# Patient Record
Sex: Female | Born: 1945 | Race: White | Hispanic: No | Smoking: Former smoker
Health system: Southern US, Community
[De-identification: ages and names within clinical notes are randomized; demographics above are authoritative.]

## PROBLEM LIST (undated history)

## (undated) DIAGNOSIS — G4730 Sleep apnea, unspecified: Secondary | ICD-10-CM

## (undated) DIAGNOSIS — F419 Anxiety disorder, unspecified: Secondary | ICD-10-CM

## (undated) DIAGNOSIS — G2581 Restless legs syndrome: Secondary | ICD-10-CM

## (undated) DIAGNOSIS — N189 Chronic kidney disease, unspecified: Secondary | ICD-10-CM

## (undated) DIAGNOSIS — M1990 Unspecified osteoarthritis, unspecified site: Secondary | ICD-10-CM

## (undated) DIAGNOSIS — R509 Fever, unspecified: Secondary | ICD-10-CM

## (undated) DIAGNOSIS — K5790 Diverticulosis of intestine, part unspecified, without perforation or abscess without bleeding: Secondary | ICD-10-CM

## (undated) DIAGNOSIS — N879 Dysplasia of cervix uteri, unspecified: Secondary | ICD-10-CM

## (undated) DIAGNOSIS — R569 Unspecified convulsions: Secondary | ICD-10-CM

## (undated) DIAGNOSIS — K219 Gastro-esophageal reflux disease without esophagitis: Secondary | ICD-10-CM

## (undated) DIAGNOSIS — K3184 Gastroparesis: Secondary | ICD-10-CM

## (undated) DIAGNOSIS — D649 Anemia, unspecified: Secondary | ICD-10-CM

## (undated) DIAGNOSIS — Z9221 Personal history of antineoplastic chemotherapy: Secondary | ICD-10-CM

## (undated) DIAGNOSIS — E119 Type 2 diabetes mellitus without complications: Secondary | ICD-10-CM

## (undated) DIAGNOSIS — Z972 Presence of dental prosthetic device (complete) (partial): Secondary | ICD-10-CM

## (undated) DIAGNOSIS — R0600 Dyspnea, unspecified: Secondary | ICD-10-CM

## (undated) DIAGNOSIS — Z923 Personal history of irradiation: Secondary | ICD-10-CM

## (undated) DIAGNOSIS — B029 Zoster without complications: Secondary | ICD-10-CM

## (undated) DIAGNOSIS — T7840XA Allergy, unspecified, initial encounter: Secondary | ICD-10-CM

## (undated) DIAGNOSIS — I10 Essential (primary) hypertension: Secondary | ICD-10-CM

## (undated) DIAGNOSIS — C099 Malignant neoplasm of tonsil, unspecified: Secondary | ICD-10-CM

## (undated) DIAGNOSIS — J189 Pneumonia, unspecified organism: Secondary | ICD-10-CM

## (undated) DIAGNOSIS — C50919 Malignant neoplasm of unspecified site of unspecified female breast: Secondary | ICD-10-CM

## (undated) DIAGNOSIS — E785 Hyperlipidemia, unspecified: Secondary | ICD-10-CM

## (undated) DIAGNOSIS — G709 Myoneural disorder, unspecified: Secondary | ICD-10-CM

## (undated) DIAGNOSIS — H269 Unspecified cataract: Secondary | ICD-10-CM

## (undated) DIAGNOSIS — G629 Polyneuropathy, unspecified: Secondary | ICD-10-CM

## (undated) HISTORY — DX: Fever, unspecified: R50.9

## (undated) HISTORY — DX: Unspecified cataract: H26.9

## (undated) HISTORY — PX: FRACTURE SURGERY: SHX138

## (undated) HISTORY — PX: MOLE REMOVAL: SHX2046

## (undated) HISTORY — DX: Zoster without complications: B02.9

## (undated) HISTORY — DX: Dysplasia of cervix uteri, unspecified: N87.9

## (undated) HISTORY — PX: TONSILLECTOMY: SUR1361

## (undated) HISTORY — PX: EYE SURGERY: SHX253

## (undated) HISTORY — PX: PORT-A-CATH REMOVAL: SHX5289

## (undated) HISTORY — DX: Gastroparesis: K31.84

## (undated) HISTORY — DX: Anxiety disorder, unspecified: F41.9

## (undated) HISTORY — DX: Malignant neoplasm of unspecified site of unspecified female breast: C50.919

## (undated) HISTORY — DX: Malignant neoplasm of tonsil, unspecified: C09.9

## (undated) HISTORY — DX: Chronic kidney disease, unspecified: N18.9

## (undated) HISTORY — PX: RADICAL NECK DISSECTION: SHX2284

## (undated) HISTORY — PX: APPENDECTOMY: SHX54

## (undated) HISTORY — DX: Hyperlipidemia, unspecified: E78.5

## (undated) HISTORY — DX: Diverticulosis of intestine, part unspecified, without perforation or abscess without bleeding: K57.90

## (undated) HISTORY — PX: CHOLECYSTECTOMY: SHX55

## (undated) HISTORY — DX: Anemia, unspecified: D64.9

## (undated) HISTORY — PX: BREAST SURGERY: SHX581

## (undated) HISTORY — DX: Allergy, unspecified, initial encounter: T78.40XA

## (undated) HISTORY — PX: BREAST CYST ASPIRATION: SHX578

## (undated) HISTORY — DX: Unspecified convulsions: R56.9

## (undated) HISTORY — PX: PORTACATH PLACEMENT: SHX2246

## (undated) HISTORY — DX: Unspecified osteoarthritis, unspecified site: M19.90

---

## 1997-07-07 ENCOUNTER — Emergency Department (HOSPITAL_COMMUNITY): Admission: EM | Admit: 1997-07-07 | Discharge: 1997-07-07 | Payer: Self-pay | Admitting: Emergency Medicine

## 2004-01-03 ENCOUNTER — Ambulatory Visit: Payer: Self-pay | Admitting: Family Medicine

## 2004-02-17 DIAGNOSIS — C099 Malignant neoplasm of tonsil, unspecified: Secondary | ICD-10-CM

## 2004-02-17 HISTORY — DX: Malignant neoplasm of tonsil, unspecified: C09.9

## 2004-03-04 ENCOUNTER — Ambulatory Visit: Payer: Self-pay | Admitting: Family Medicine

## 2004-03-12 ENCOUNTER — Ambulatory Visit: Payer: Self-pay | Admitting: General Surgery

## 2004-03-14 ENCOUNTER — Ambulatory Visit: Payer: Self-pay | Admitting: Otolaryngology

## 2004-03-17 ENCOUNTER — Ambulatory Visit: Payer: Self-pay | Admitting: Radiation Oncology

## 2004-03-19 ENCOUNTER — Ambulatory Visit: Payer: Self-pay | Admitting: Oncology

## 2004-03-21 ENCOUNTER — Ambulatory Visit: Payer: Self-pay | Admitting: Oncology

## 2004-03-26 ENCOUNTER — Ambulatory Visit: Payer: Self-pay | Admitting: Otolaryngology

## 2004-04-16 ENCOUNTER — Ambulatory Visit: Payer: Self-pay | Admitting: Radiation Oncology

## 2004-05-17 ENCOUNTER — Ambulatory Visit: Payer: Self-pay | Admitting: Radiation Oncology

## 2004-05-22 ENCOUNTER — Ambulatory Visit: Payer: Self-pay | Admitting: Family Medicine

## 2004-05-27 ENCOUNTER — Ambulatory Visit: Payer: Self-pay | Admitting: Family Medicine

## 2004-06-16 ENCOUNTER — Ambulatory Visit: Payer: Self-pay | Admitting: Radiation Oncology

## 2004-07-17 ENCOUNTER — Ambulatory Visit: Payer: Self-pay | Admitting: Radiation Oncology

## 2004-08-16 ENCOUNTER — Ambulatory Visit: Payer: Self-pay | Admitting: Radiation Oncology

## 2004-08-26 ENCOUNTER — Ambulatory Visit: Payer: Self-pay | Admitting: Family Medicine

## 2004-09-12 ENCOUNTER — Ambulatory Visit: Payer: Self-pay | Admitting: Oncology

## 2004-09-17 ENCOUNTER — Ambulatory Visit: Payer: Self-pay | Admitting: Oncology

## 2004-09-24 ENCOUNTER — Ambulatory Visit: Payer: Self-pay | Admitting: Family Medicine

## 2004-10-08 ENCOUNTER — Inpatient Hospital Stay: Payer: Self-pay | Admitting: Otolaryngology

## 2004-10-21 ENCOUNTER — Ambulatory Visit: Payer: Self-pay | Admitting: Otolaryngology

## 2004-10-24 ENCOUNTER — Encounter: Payer: Self-pay | Admitting: Otolaryngology

## 2004-11-16 ENCOUNTER — Encounter: Payer: Self-pay | Admitting: Otolaryngology

## 2004-11-21 ENCOUNTER — Ambulatory Visit: Payer: Self-pay | Admitting: Otolaryngology

## 2004-12-11 ENCOUNTER — Ambulatory Visit: Payer: Self-pay | Admitting: Radiation Oncology

## 2004-12-25 ENCOUNTER — Ambulatory Visit: Payer: Self-pay | Admitting: Oncology

## 2005-01-16 ENCOUNTER — Ambulatory Visit: Payer: Self-pay | Admitting: Oncology

## 2005-01-30 ENCOUNTER — Ambulatory Visit: Payer: Self-pay | Admitting: Specialist

## 2005-02-16 ENCOUNTER — Ambulatory Visit: Payer: Self-pay | Admitting: Oncology

## 2005-03-26 ENCOUNTER — Ambulatory Visit: Payer: Self-pay | Admitting: Family Medicine

## 2005-03-27 ENCOUNTER — Ambulatory Visit: Payer: Self-pay | Admitting: Oncology

## 2005-04-01 ENCOUNTER — Ambulatory Visit: Payer: Self-pay | Admitting: Oncology

## 2005-04-16 ENCOUNTER — Ambulatory Visit: Payer: Self-pay | Admitting: Oncology

## 2005-07-28 ENCOUNTER — Ambulatory Visit: Payer: Self-pay | Admitting: Oncology

## 2005-08-16 ENCOUNTER — Ambulatory Visit: Payer: Self-pay | Admitting: Oncology

## 2005-09-17 ENCOUNTER — Ambulatory Visit: Payer: Self-pay | Admitting: Radiation Oncology

## 2005-11-09 ENCOUNTER — Ambulatory Visit: Payer: Self-pay | Admitting: Family Medicine

## 2005-11-09 ENCOUNTER — Encounter: Payer: Self-pay | Admitting: Family Medicine

## 2005-11-09 ENCOUNTER — Other Ambulatory Visit: Admission: RE | Admit: 2005-11-09 | Discharge: 2005-11-09 | Payer: Self-pay | Admitting: Family Medicine

## 2005-11-16 LAB — HM DEXA SCAN

## 2005-11-25 ENCOUNTER — Ambulatory Visit: Payer: Self-pay | Admitting: Oncology

## 2005-12-09 ENCOUNTER — Ambulatory Visit: Payer: Self-pay | Admitting: Family Medicine

## 2005-12-11 LAB — FECAL OCCULT BLOOD, GUAIAC: Fecal Occult Blood: NEGATIVE

## 2005-12-17 ENCOUNTER — Ambulatory Visit: Payer: Self-pay | Admitting: Family Medicine

## 2005-12-17 ENCOUNTER — Ambulatory Visit: Payer: Self-pay | Admitting: Oncology

## 2006-02-18 ENCOUNTER — Ambulatory Visit: Payer: Self-pay | Admitting: Radiation Oncology

## 2006-03-25 ENCOUNTER — Ambulatory Visit: Payer: Self-pay | Admitting: Oncology

## 2006-04-17 ENCOUNTER — Ambulatory Visit: Payer: Self-pay | Admitting: Oncology

## 2006-05-25 ENCOUNTER — Ambulatory Visit: Payer: Self-pay | Admitting: Family Medicine

## 2006-05-25 LAB — CONVERTED CEMR LAB
ALT: 10 units/L (ref 0–40)
AST: 18 units/L (ref 0–37)
LDL Cholesterol: 94 mg/dL (ref 0–99)
Total CHOL/HDL Ratio: 3.7
Triglycerides: 168 mg/dL — ABNORMAL HIGH (ref 0–149)
VLDL: 34 mg/dL (ref 0–40)

## 2006-05-27 ENCOUNTER — Ambulatory Visit: Payer: Self-pay | Admitting: Family Medicine

## 2006-06-03 ENCOUNTER — Ambulatory Visit: Payer: Self-pay | Admitting: Otolaryngology

## 2006-07-18 ENCOUNTER — Ambulatory Visit: Payer: Self-pay | Admitting: Oncology

## 2006-07-22 ENCOUNTER — Ambulatory Visit: Payer: Self-pay | Admitting: Oncology

## 2006-08-17 ENCOUNTER — Ambulatory Visit: Payer: Self-pay | Admitting: Oncology

## 2007-01-17 ENCOUNTER — Ambulatory Visit: Payer: Self-pay | Admitting: Oncology

## 2007-01-19 ENCOUNTER — Encounter (INDEPENDENT_AMBULATORY_CARE_PROVIDER_SITE_OTHER): Payer: Self-pay | Admitting: *Deleted

## 2007-01-21 ENCOUNTER — Encounter: Payer: Self-pay | Admitting: Family Medicine

## 2007-01-21 ENCOUNTER — Ambulatory Visit: Payer: Self-pay | Admitting: Oncology

## 2007-02-17 ENCOUNTER — Ambulatory Visit: Payer: Self-pay | Admitting: Oncology

## 2007-03-20 ENCOUNTER — Ambulatory Visit: Payer: Self-pay | Admitting: Oncology

## 2007-06-02 ENCOUNTER — Ambulatory Visit: Payer: Self-pay | Admitting: Family Medicine

## 2007-06-02 DIAGNOSIS — L989 Disorder of the skin and subcutaneous tissue, unspecified: Secondary | ICD-10-CM | POA: Insufficient documentation

## 2007-07-18 ENCOUNTER — Ambulatory Visit: Payer: Self-pay | Admitting: Oncology

## 2007-07-18 ENCOUNTER — Encounter: Payer: Self-pay | Admitting: Family Medicine

## 2007-07-18 DIAGNOSIS — R609 Edema, unspecified: Secondary | ICD-10-CM

## 2007-07-18 DIAGNOSIS — E785 Hyperlipidemia, unspecified: Secondary | ICD-10-CM | POA: Insufficient documentation

## 2007-07-19 ENCOUNTER — Ambulatory Visit: Payer: Self-pay | Admitting: Family Medicine

## 2007-07-19 DIAGNOSIS — M899 Disorder of bone, unspecified: Secondary | ICD-10-CM | POA: Insufficient documentation

## 2007-07-19 DIAGNOSIS — M949 Disorder of cartilage, unspecified: Secondary | ICD-10-CM

## 2007-07-21 ENCOUNTER — Ambulatory Visit: Payer: Self-pay | Admitting: Family Medicine

## 2007-07-22 LAB — CONVERTED CEMR LAB
ALT: 14 units/L (ref 0–35)
Albumin: 3.7 g/dL (ref 3.5–5.2)
BUN: 9 mg/dL (ref 6–23)
Basophils Relative: 0.5 % (ref 0.0–1.0)
Bilirubin, Direct: 0.1 mg/dL (ref 0.0–0.3)
CO2: 28 meq/L (ref 19–32)
Calcium: 9 mg/dL (ref 8.4–10.5)
Eosinophils Relative: 1.8 % (ref 0.0–5.0)
GFR calc Af Amer: 94 mL/min
Glucose, Bld: 112 mg/dL — ABNORMAL HIGH (ref 70–99)
HCT: 34 % — ABNORMAL LOW (ref 36.0–46.0)
Hemoglobin: 11.3 g/dL — ABNORMAL LOW (ref 12.0–15.0)
LDL Cholesterol: 110 mg/dL — ABNORMAL HIGH (ref 0–99)
Lymphocytes Relative: 20.8 % (ref 12.0–46.0)
Monocytes Absolute: 0.2 10*3/uL (ref 0.1–1.0)
Monocytes Relative: 6.4 % (ref 3.0–12.0)
Neutro Abs: 2.6 10*3/uL (ref 1.4–7.7)
RBC: 3.99 M/uL (ref 3.87–5.11)
RDW: 13.3 % (ref 11.5–14.6)
Sodium: 140 meq/L (ref 135–145)
Total CHOL/HDL Ratio: 3.6
Total Protein: 6.9 g/dL (ref 6.0–8.3)
Triglycerides: 83 mg/dL (ref 0–149)
WBC: 3.6 10*3/uL — ABNORMAL LOW (ref 4.5–10.5)

## 2007-07-24 LAB — CONVERTED CEMR LAB: Vit D, 1,25-Dihydroxy: 27 — ABNORMAL LOW (ref 30–89)

## 2007-07-25 ENCOUNTER — Encounter (INDEPENDENT_AMBULATORY_CARE_PROVIDER_SITE_OTHER): Payer: Self-pay | Admitting: *Deleted

## 2007-07-28 ENCOUNTER — Ambulatory Visit: Payer: Self-pay | Admitting: Oncology

## 2007-08-17 ENCOUNTER — Ambulatory Visit: Payer: Self-pay | Admitting: Oncology

## 2007-09-20 ENCOUNTER — Ambulatory Visit: Payer: Self-pay | Admitting: Family Medicine

## 2007-10-25 ENCOUNTER — Emergency Department: Payer: Self-pay | Admitting: Emergency Medicine

## 2007-12-12 ENCOUNTER — Encounter: Payer: Self-pay | Admitting: Family Medicine

## 2007-12-12 ENCOUNTER — Ambulatory Visit: Payer: Self-pay | Admitting: Family Medicine

## 2008-01-17 ENCOUNTER — Ambulatory Visit: Payer: Self-pay | Admitting: Oncology

## 2008-01-25 ENCOUNTER — Ambulatory Visit: Payer: Self-pay | Admitting: Oncology

## 2008-01-25 ENCOUNTER — Encounter: Payer: Self-pay | Admitting: Family Medicine

## 2008-02-17 ENCOUNTER — Ambulatory Visit: Payer: Self-pay | Admitting: Oncology

## 2008-03-19 ENCOUNTER — Ambulatory Visit: Payer: Self-pay | Admitting: Oncology

## 2008-07-04 ENCOUNTER — Telehealth: Payer: Self-pay | Admitting: Family Medicine

## 2008-07-17 ENCOUNTER — Ambulatory Visit: Payer: Self-pay | Admitting: Oncology

## 2008-07-19 ENCOUNTER — Encounter: Payer: Self-pay | Admitting: Family Medicine

## 2008-07-19 ENCOUNTER — Ambulatory Visit: Payer: Self-pay | Admitting: Family Medicine

## 2008-07-19 ENCOUNTER — Other Ambulatory Visit: Admission: RE | Admit: 2008-07-19 | Discharge: 2008-07-19 | Payer: Self-pay | Admitting: Family Medicine

## 2008-07-19 DIAGNOSIS — E559 Vitamin D deficiency, unspecified: Secondary | ICD-10-CM

## 2008-07-23 LAB — CONVERTED CEMR LAB
ALT: 17 units/L (ref 0–35)
AST: 22 units/L (ref 0–37)
Albumin: 3.9 g/dL (ref 3.5–5.2)
Alkaline Phosphatase: 80 units/L (ref 39–117)
Basophils Relative: 0.5 % (ref 0.0–3.0)
CO2: 30 meq/L (ref 19–32)
Calcium: 9.1 mg/dL (ref 8.4–10.5)
Eosinophils Relative: 2 % (ref 0.0–5.0)
GFR calc non Af Amer: 77.6 mL/min (ref >60)
HCT: 33.4 % — ABNORMAL LOW (ref 36.0–46.0)
HDL: 54.1 mg/dL (ref >39.00)
Hemoglobin: 11.1 g/dL — ABNORMAL LOW (ref 12.0–15.0)
Lymphocytes Relative: 28.5 % (ref 12.0–46.0)
Monocytes Relative: 5.2 % (ref 3.0–12.0)
Neutro Abs: 3.5 10*3/uL (ref 1.4–7.7)
Potassium: 3.9 meq/L (ref 3.5–5.1)
RBC: 4 M/uL (ref 3.87–5.11)
Sodium: 142 meq/L (ref 135–145)
Total CHOL/HDL Ratio: 3
Total Protein: 7.3 g/dL (ref 6.0–8.3)
VLDL: 21.8 mg/dL (ref 0.0–40.0)

## 2008-08-01 ENCOUNTER — Ambulatory Visit: Payer: Self-pay | Admitting: Family Medicine

## 2008-08-01 ENCOUNTER — Ambulatory Visit: Payer: Self-pay | Admitting: Oncology

## 2008-08-01 LAB — CONVERTED CEMR LAB: OCCULT 2: NEGATIVE

## 2008-08-03 ENCOUNTER — Encounter (INDEPENDENT_AMBULATORY_CARE_PROVIDER_SITE_OTHER): Payer: Self-pay | Admitting: *Deleted

## 2008-08-16 ENCOUNTER — Ambulatory Visit: Payer: Self-pay | Admitting: Oncology

## 2008-11-20 ENCOUNTER — Telehealth: Payer: Self-pay | Admitting: Family Medicine

## 2009-01-16 ENCOUNTER — Ambulatory Visit: Payer: Self-pay | Admitting: Oncology

## 2009-01-31 ENCOUNTER — Ambulatory Visit: Payer: Self-pay | Admitting: Oncology

## 2009-02-16 ENCOUNTER — Ambulatory Visit: Payer: Self-pay | Admitting: Oncology

## 2009-03-19 ENCOUNTER — Ambulatory Visit: Payer: Self-pay | Admitting: Oncology

## 2009-03-19 LAB — HM MAMMOGRAPHY: HM Mammogram: NORMAL

## 2009-04-16 ENCOUNTER — Ambulatory Visit: Payer: Self-pay | Admitting: Oncology

## 2009-06-16 ENCOUNTER — Ambulatory Visit: Payer: Self-pay | Admitting: Oncology

## 2009-06-26 ENCOUNTER — Ambulatory Visit: Payer: Self-pay | Admitting: Oncology

## 2009-07-17 ENCOUNTER — Ambulatory Visit: Payer: Self-pay | Admitting: Oncology

## 2009-07-17 HISTORY — PX: ESOPHAGOGASTRODUODENOSCOPY: SHX1529

## 2009-07-19 ENCOUNTER — Encounter: Payer: Self-pay | Admitting: Family Medicine

## 2009-07-24 ENCOUNTER — Ambulatory Visit: Payer: Self-pay | Admitting: Gastroenterology

## 2009-07-24 ENCOUNTER — Encounter: Payer: Self-pay | Admitting: Family Medicine

## 2009-07-24 ENCOUNTER — Telehealth (INDEPENDENT_AMBULATORY_CARE_PROVIDER_SITE_OTHER): Payer: Self-pay | Admitting: *Deleted

## 2009-07-29 ENCOUNTER — Telehealth: Payer: Self-pay | Admitting: Family Medicine

## 2009-08-16 ENCOUNTER — Ambulatory Visit: Payer: Self-pay | Admitting: Oncology

## 2009-08-26 ENCOUNTER — Ambulatory Visit: Payer: Self-pay | Admitting: Family Medicine

## 2009-08-26 DIAGNOSIS — F411 Generalized anxiety disorder: Secondary | ICD-10-CM | POA: Insufficient documentation

## 2009-08-26 DIAGNOSIS — K3184 Gastroparesis: Secondary | ICD-10-CM

## 2009-08-28 LAB — CONVERTED CEMR LAB
Alkaline Phosphatase: 79 units/L (ref 39–117)
Basophils Absolute: 0 10*3/uL (ref 0.0–0.1)
Basophils Relative: 0.4 % (ref 0.0–3.0)
Bilirubin, Direct: 0.1 mg/dL (ref 0.0–0.3)
CO2: 29 meq/L (ref 19–32)
Calcium: 9 mg/dL (ref 8.4–10.5)
Creatinine, Ser: 0.7 mg/dL (ref 0.4–1.2)
Eosinophils Absolute: 0.2 10*3/uL (ref 0.0–0.7)
HDL: 46.6 mg/dL (ref >39.00)
LDL Cholesterol: 93 mg/dL (ref 0–99)
Lymphocytes Relative: 21.9 % (ref 12.0–46.0)
MCHC: 33.4 g/dL (ref 30.0–36.0)
Neutrophils Relative %: 66.7 % (ref 43.0–77.0)
RBC: 3.99 M/uL (ref 3.87–5.11)
Total CHOL/HDL Ratio: 3
Total Protein: 6.8 g/dL (ref 6.0–8.3)
Triglycerides: 110 mg/dL (ref 0.0–149.0)
VLDL: 22 mg/dL (ref 0.0–40.0)

## 2009-08-29 ENCOUNTER — Ambulatory Visit: Payer: Self-pay | Admitting: Family Medicine

## 2009-08-29 DIAGNOSIS — D509 Iron deficiency anemia, unspecified: Secondary | ICD-10-CM

## 2009-08-30 LAB — CONVERTED CEMR LAB
Folate: 11.1 ng/mL
Iron: 50 ug/dL (ref 42–145)
Transferrin: 306.8 mg/dL (ref 212.0–360.0)
Vitamin B-12: 191 pg/mL — ABNORMAL LOW (ref 211–911)

## 2009-09-04 ENCOUNTER — Ambulatory Visit: Payer: Self-pay | Admitting: Family Medicine

## 2009-09-04 DIAGNOSIS — E538 Deficiency of other specified B group vitamins: Secondary | ICD-10-CM

## 2009-09-05 ENCOUNTER — Ambulatory Visit: Payer: Self-pay | Admitting: Family Medicine

## 2009-09-11 ENCOUNTER — Ambulatory Visit: Payer: Self-pay | Admitting: Family Medicine

## 2009-09-16 ENCOUNTER — Ambulatory Visit: Payer: Self-pay | Admitting: Oncology

## 2009-09-18 ENCOUNTER — Ambulatory Visit: Payer: Self-pay | Admitting: Family Medicine

## 2009-09-25 ENCOUNTER — Ambulatory Visit: Payer: Self-pay | Admitting: Family Medicine

## 2009-10-02 ENCOUNTER — Ambulatory Visit: Payer: Self-pay | Admitting: Family Medicine

## 2009-10-03 ENCOUNTER — Encounter: Payer: Self-pay | Admitting: Family Medicine

## 2009-10-03 LAB — CONVERTED CEMR LAB
Basophils Absolute: 0 10*3/uL (ref 0.0–0.1)
Basophils Relative: 0.5 % (ref 0.0–3.0)
Eosinophils Absolute: 0.1 10*3/uL (ref 0.0–0.7)
Eosinophils Relative: 2 % (ref 0.0–5.0)
Ferritin: 23.3 ng/mL (ref 10.0–291.0)
HCT: 35.2 % — ABNORMAL LOW (ref 36.0–46.0)
Hemoglobin: 11.5 g/dL — ABNORMAL LOW (ref 12.0–15.0)
Lymphocytes Relative: 23 % (ref 12.0–46.0)
Lymphs Abs: 1.3 10*3/uL (ref 0.7–4.0)
MCHC: 32.6 g/dL (ref 30.0–36.0)
MCV: 81.5 fL (ref 78.0–100.0)
Monocytes Absolute: 0.5 10*3/uL (ref 0.1–1.0)
Monocytes Relative: 8.6 % (ref 3.0–12.0)
Neutro Abs: 3.6 10*3/uL (ref 1.4–7.7)
Neutrophils Relative %: 65.9 % (ref 43.0–77.0)
Platelets: 274 10*3/uL (ref 150.0–400.0)
RBC: 4.32 M/uL (ref 3.87–5.11)
RDW: 20 % — ABNORMAL HIGH (ref 11.5–14.6)
Vitamin B-12: 504 pg/mL (ref 211–911)
WBC: 5.5 10*3/uL (ref 4.5–10.5)

## 2009-10-07 ENCOUNTER — Ambulatory Visit: Payer: Self-pay | Admitting: Oncology

## 2009-10-09 ENCOUNTER — Encounter: Payer: Self-pay | Admitting: Family Medicine

## 2009-10-29 ENCOUNTER — Ambulatory Visit: Payer: Self-pay | Admitting: Gastroenterology

## 2009-10-29 ENCOUNTER — Encounter: Payer: Self-pay | Admitting: Family Medicine

## 2009-10-31 LAB — PATHOLOGY REPORT

## 2009-11-13 ENCOUNTER — Ambulatory Visit: Payer: Self-pay | Admitting: Family Medicine

## 2009-11-26 ENCOUNTER — Ambulatory Visit: Payer: Self-pay | Admitting: Family Medicine

## 2009-11-26 DIAGNOSIS — R109 Unspecified abdominal pain: Secondary | ICD-10-CM | POA: Insufficient documentation

## 2009-11-26 LAB — CONVERTED CEMR LAB
Bacteria, UA: 0
Bilirubin Urine: NEGATIVE
Protein, U semiquant: NEGATIVE
Urobilinogen, UA: 0.2
WBC Urine, dipstick: NEGATIVE
WBC, UA: 0 cells/hpf
Yeast, UA: 0

## 2009-12-03 ENCOUNTER — Encounter: Payer: Self-pay | Admitting: Family Medicine

## 2009-12-03 ENCOUNTER — Ambulatory Visit: Payer: Self-pay | Admitting: Family Medicine

## 2009-12-17 ENCOUNTER — Encounter: Payer: Self-pay | Admitting: Family Medicine

## 2009-12-25 ENCOUNTER — Ambulatory Visit: Payer: Self-pay | Admitting: Family Medicine

## 2009-12-25 LAB — CONVERTED CEMR LAB
Basophils Absolute: 0 10*3/uL (ref 0.0–0.1)
Eosinophils Absolute: 0.2 10*3/uL (ref 0.0–0.7)
HCT: 38 % (ref 36.0–46.0)
Lymphs Abs: 1.3 10*3/uL (ref 0.7–4.0)
MCHC: 33.6 g/dL (ref 30.0–36.0)
Monocytes Absolute: 0.4 10*3/uL (ref 0.1–1.0)
Monocytes Relative: 7.8 % (ref 3.0–12.0)
Platelets: 256 10*3/uL (ref 150.0–400.0)
RDW: 14.7 % — ABNORMAL HIGH (ref 11.5–14.6)
Vitamin B-12: 306 pg/mL (ref 211–911)

## 2009-12-31 ENCOUNTER — Encounter: Payer: Self-pay | Admitting: Family Medicine

## 2010-01-01 ENCOUNTER — Ambulatory Visit: Payer: Self-pay | Admitting: Family Medicine

## 2010-03-04 ENCOUNTER — Ambulatory Visit
Admission: RE | Admit: 2010-03-04 | Discharge: 2010-03-04 | Payer: Self-pay | Source: Home / Self Care | Attending: Family Medicine | Admitting: Family Medicine

## 2010-03-18 NOTE — Miscellaneous (Signed)
Summary: Cyanocobalamin injection added to med list  Clinical Lists Changes  Medications: Added new medication of VITAMIN B COMPLEX-C   CAPS (B COMPLEX-C) Take 1 capsule by mouth once a day Added new medication of CYANOCOBALAMIN 1000 MCG/ML SOLN (CYANOCOBALAMIN) get Vit B 12 injection every six weeks.     Current Allergies: ! * DIFLUCAN ! AMOXICILLIN

## 2010-03-18 NOTE — Procedures (Signed)
Summary: Upper GI Endoscopy by Dr.Janak [facility]  Upper GI Endoscopy by Dr.Janak [facility]   Imported By: Beau Fanny 07/26/2009 16:47:42  _____________________________________________________________________  External Attachment:    Type:   Image     Comment:   External Document  Appended Document: Upper GI Endoscopy by Dr.Janak [facility]    Clinical Lists Changes  Observations: Added new observation of PAST MED HX: Hyperlipidemia tonsillar cancer diverticulosis  shingles in past   oncol--Dr [facility] derm-- Dr Orson Aloe ENT-- Dr Gertie Baron GI- Dr Bluford Kaufmann  (07/28/2009 15:41) Added new observation of PAST SURG HX: Breast biopsy- benign Appendectomy 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) Holter monitor- neg Dexa- normal (2002),  osteopenia forearm (11/2005) Colonoscopy- diverticulosis (02/2003) Tonsillectomy- cancer, had chemo and rad Cholecystectomy mole removals EGD- nl with few gastric polyps 6/11 (07/28/2009 15:41)       Past Medical History:    Hyperlipidemia    tonsillar cancer    diverticulosis     shingles in past            oncol--Dr [facility]    derm-- Dr Orson Aloe    ENT-- Dr Gertie Baron    GI- Dr Bluford Kaufmann  Past Surgical History:    Breast biopsy- benign    Appendectomy    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

## 2010-03-18 NOTE — Consult Note (Signed)
Summary: [facility]  [facility]   Imported By: Maryln Gottron 12/23/2009 13:46:57  _____________________________________________________________________  External Attachment:    Type:   Image     Comment:   External Document

## 2010-03-18 NOTE — Miscellaneous (Signed)
Summary: Controlled Substance Agreement  Controlled Substance Agreement   Imported By: Lanelle Bal 08/29/2009 10:26:18  _____________________________________________________________________  External Attachment:    Type:   Image     Comment:   External Document

## 2010-03-18 NOTE — Letter (Signed)
Summary: [facility]  [facility]   Imported By: Lanelle Bal 11/11/2009 13:52:22  _____________________________________________________________________  External Attachment:    Type:   Image     Comment:   External Document

## 2010-03-18 NOTE — Procedures (Signed)
Summary: Colonoscopy by Dr.Paul Oh  Colonoscopy by Dr.Paul Oh   Imported By: Beau Fanny 11/07/2009 09:08:14  _____________________________________________________________________  External Attachment:    Type:   Image     Comment:   External Document  Appended Document: Colonoscopy by Dr.Paul Oh    Clinical Lists Changes  Observations: Added new observation of PAST MED HX: Hyperlipidemia tonsillar cancer diverticulosis  shingles in past gastroparesis (related to prev radition tx)  anxiety anemia iron def anemia B12 def  diverticulosis on colonosc  oncol--Dr Choski derm-- Dr Orson Aloe ENT-- Dr Gertie Baron GI- Dr Bluford Kaufmann  (11/07/2009 20:17) Added new observation of PAST SURG HX: Breast biopsy- benign Appendectomy 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) Holter monitor- neg Dexa- normal (2002),  osteopenia forearm (11/2005) Colonoscopy- diverticulosis (02/2003) Tonsillectomy- cancer, had chemo and rad Cholecystectomy mole removals EGD- nl with few gastric polyps 6/11 9/11 colonosc with tics  (11/07/2009 20:17) Added new observation of COLONOSCOPY: Diverticulosis (10/29/2009 20:18)       Past Medical History:    Hyperlipidemia    tonsillar cancer    diverticulosis     shingles in past    gastroparesis (related to prev radition tx)     anxiety    anemia iron def    anemia B12 def     diverticulosis on colonosc        oncol--Dr Choski    derm-- Dr Orson Aloe    ENT-- Dr Gertie Baron    GI- Dr Bluford Kaufmann  Past Surgical History:    Breast biopsy- benign    Appendectomy    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

## 2010-03-18 NOTE — Assessment & Plan Note (Signed)
Summary: FOLLOW UP AFTER LABS PER DR Itzamar Traynor/RI   Vital Signs:  Patient profile:   65 year old female Height:      65 inches Weight:      185.25 pounds BMI:     30.94 Temp:     98 degrees F oral Pulse rate:   68 / minute Pulse rhythm:   regular BP sitting:   126 / 70  (left arm) Cuff size:   regular  Vitals Entered By: Lewanda Rife LPN (September 04, 2009 11:57 AM) CC: follow-up visit after labs   History of Present Illness: here for f/u of iron def anemia and also B12 def  wt is down 3 lb  bp good   B12 is 191- low   ferritin low at 6.9 and iron sat low at 11.6 iron level low nl at 50 hb is 10.4   colonosc showed diverticulosis in 05 6/11 few gastric polyps on EGD  has been anemic all her life  at a time from menses then from giving blood- stopped that  then poss from chemo and radiation   has been extremely tired   did a stool card immunoassay and dropped it off sunday   oncologist did not recommend frequent colonosc  last 05 with diverticulosis no colon ca in fam  no hx of polyps   no abd pain no blood in stool reglan has helped all her gi symptoms -- has stopped vomiting       Allergies: 1)  ! * Diflucan 2)  ! 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 basal cell , CHF Mother: high chol, skin ca -? 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 Eyes:  Denies blurring. CV:  Denies chest pain or discomfort, lightheadness, and palpitations. Resp:  Denies cough, shortness of breath, and wheezing. GI:  Complains of indigestion; denies abdominal pain, bloody stools, change in bowel habits, nausea, and vomiting. GU:  Denies hematuria and urinary frequency. MS:  Denies muscle aches, cramps, and stiffness. Derm:  Denies itching, lesion(s), poor wound healing, and rash. Neuro:  Denies numbness and tingling. Heme:  Denies abnormal bruising, bleeding, and enlarge lymph nodes.  Physical Exam  General:  overweight but generally well appearing  Head:  normocephalic, atraumatic, and no abnormalities observed.   Eyes:  vision grossly intact, pupils equal, pupils round, and pupils reactive to light.  no conjunctival pallor, injection or icterus  Mouth:  pharynx pink and moist.   Neck:  supple ,nl rom ,  no adenopathy sx changes noted  Lungs:  Normal respiratory effort, chest expands symmetrically. Lungs are clear to auscultation, no crackles or wheezes. Heart:  Normal rate and regular rhythm. S1 and S2 normal without gallop, murmur, click, rub or other extra sounds. Abdomen:  mild tenderness without rebound or gaurding around umbilicus normal bowel sounds, no distention, no masses, no hepatomegaly, and no splenomegaly.   Msk:  No deformity or scoliosis noted of thoracic or lumbar spine.   Extremities:  No clubbing, cyanosis,  edema, or deformity noted with normal full range of motion of all joints.   Neurologic:  sensation intact to light touch, gait normal, and DTRs symmetrical and normal.   Skin:  Intact without suspicious lesions or rashes no pallor or jaundice  Cervical Nodes:  No lymphadenopathy noted Inguinal Nodes:  No significant adenopathy Psych:  normal affect, talkative and pleasant    Impression & Recommendations:  Problem # 1:  UNSPECIFIED ANEMIA (ICD-285.9) Assessment Deteriorated with B12 and iron def disc lifestyle and diet recent egd  will ref for likely colonosc  hx of ? lifelong anemia so malabasorbtion is also in diff one B12 shot weekly for 4 weeks ferrous sulfate otc 325 mg daily re check labs in 1 mo and make plan also pend stool immunoassay card that should be back soon Orders: Vit B12 1000 mcg (J3420) Admin of Therapeutic Inj  intramuscular or subcutaneous (16109) Gastroenterology Referral (GI)  Complete Medication List: 1)  Lipitor 10 Mg Tabs (Atorvastatin calcium) .[name].. 1 by mouth every evening 2)  Ropinirole Hcl 1 Mg Tabs (Ropinirole hcl) .... 2 by mouth at bedtime 3)  Viactiv Multi-vitamin Chew (Multiple vitamins-calcium) .... One by mouth daily 4)  Omeprazole 20 Mg Cpdr (Omeprazole) .[name].. 1 by mouth once daily 5)  Reglan 10 Mg Tabs (Metoclopramide hcl) .... Take one tab four times a day 6)  Celexa 20 Mg Tabs (Citalopram hydrobromide) .... Take 1 tablet by mouth once a day in evening 7)  Flonase 50 Mcg/act Susp (Fluticasone propionate) .... 2 sprays each nostril at bedtime 8)  Xanax 0.5 Mg Tabs (Alprazolam) .... As needed 9)  Tramadol Hcl 50 Mg Tabs (Tramadol hcl) .... As needed  Patient Instructions: 1)  first B12 shot today  2)  schedule one B12 shot weekly for the next 3 weeks  3)  start ferrous sulfate 325 mg one pill daily with food  4)  we will refer you for GI visit to consider colonoscopy for anemia  5)  re check cbc with diff and ferritin and B12 level in 1  month for anemia   Current Allergies (reviewed today): ! * DIFLUCAN ! AMOXICILLIN    Medication Administration  Injection # 1:    Medication: Vit B12 1000 mcg    Diagnosis: UNSPECIFIED ANEMIA (ICD-285.9)    Route: IM    Site: L deltoid    Exp Date: 01/17/2011    Lot #: [id number]    Mfr: American Regent    Patient tolerated injection without complications    Given by: Lewanda Rife LPN (September 04, 2009 12:28 PM)  Orders Added: 1)  Vit B12 1000 mcg [J3420] 2)  Admin of Therapeutic Inj  intramuscular or subcutaneous [96372] 3)  Gastroenterology Referral [GI] 4)  Est. Patient Level IV [40981]

## 2010-03-18 NOTE — Assessment & Plan Note (Signed)
Summary: FOLLOW UP, RENEW MEDS   Vital Signs:  Patient profile:   65 year old female Height:      65 inches Weight:      188 pounds BMI:     31.40 Temp:     97.9 degrees F oral Pulse rate:   68 / minute Pulse rhythm:   regular BP sitting:   122 / 72  (left arm) Cuff size:   regular  Vitals Entered By: Lewanda Rife LPN (August 26, 2009 9:24 AM) CC: f/u visit to renew meds   History of Present Illness: here for f/u of lipids/ vit d def/ depression  feeling good overall   more GI probs with nausea -- put on reglan and that is helping a lot    wt is up 1 lb with bmi of 31- not much change  due for vit D level -last 28  due for lipids on lipitor and good diet Last Lipid ProfileCholesterol: 181 (07/19/2008 3:15:46 PM)HDL:  54.10 (07/19/2008 3:15:46 PM)LDL:  105 (07/19/2008 3:15:46 PM)Triglycerides:  Last Liver profileSGOT:  22 (07/19/2008 3:15:46 PM)SPGT:  17 (07/19/2008 3:15:46 PM)T. Bili:  0.7 (07/19/2008 3:15:46 PM)Alk Phos:  80 (07/19/2008 3:15:46 PM)   Td 03  mood - was having some anx problems- was put on celexa - better to take it at night   diet has been good overall- watches the sat fats exercise- walksand takes dance lessons 2 new dogs as well to walk   Allergies: 1)  ! * Diflucan 2)  ! 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 basal cell , CHF Mother: high chol, skin ca -? 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   Risk Factors: Smoking Status: quit (07/18/2007)  Past Medical History: Hyperlipidemia tonsillar cancer diverticulosis  shingles in past gastroparesis (related to prev radition tx)  anxiety   oncol--Dr Choski derm-- Dr Orson Aloe ENT-- Dr Gertie Baron GI- Dr Bluford Kaufmann  Social History: Marital Status: Married Children: none Occupation:  some exercise  quit smoking in 1990s (smoked for around 20 years--light) 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 Eyes:  Denies blurring and eye irritation. CV:  Denies chest pain or discomfort, lightheadness, palpitations, and shortness of breath with exertion. Resp:  Denies cough and shortness of breath. GI:  Denies abdominal pain, bloody stools, change in bowel habits, and indigestion. GU:  Denies dysuria and urinary frequency. MS:  Denies muscle aches and stiffness. Derm:  Denies itching, lesion(s), poor wound healing, and rash. Neuro:  Denies numbness and tingling. Psych:  Complains of anxiety; denies panic attacks, sense of great danger, and suicidal thoughts/plans. Endo:  Denies cold intolerance, excessive thirst, excessive urination, and heat intolerance. Heme:  Denies abnormal  bruising and bleeding.  Physical Exam  General:  overweight but generally well appearing  Head:  normocephalic, atraumatic, and no abnormalities observed.   Eyes:  vision grossly intact, pupils equal, pupils round, and pupils reactive to light.  no conjunctival pallor, injection or icterus  Neck:  supple ,nl rom , no adenopathy sx changes noted  Lungs:  Normal respiratory effort, chest expands symmetrically.  Lungs are clear to auscultation, no crackles or wheezes. Heart:  Normal rate and regular rhythm. S1 and S2 normal without gallop, murmur, click, rub or other extra sounds. Abdomen:  soft, non-tender, and normal bowel sounds.   Msk:  No deformity or scoliosis noted of thoracic or lumbar spine.   Pulses:  R and L carotid,radial,femoral,dorsalis pedis and posterior tibial pulses are full and equal bilaterally Extremities:  No clubbing, cyanosis, edema, or deformity noted with normal full range of motion of all joints.   Neurologic:  sensation intact to light touch, gait normal, and DTRs symmetrical and normal.   Skin:  Intact without suspicious lesions or rashes Cervical Nodes:  No lymphadenopathy noted Inguinal Nodes:  No significant adenopathy Psych:  normal affect, talkative and pleasant    Impression & Recommendations:  Problem # 1:  UNSPECIFIED VITAMIN D DEFICIENCY (ICD-268.9) Assessment Unchanged check level and disc at f/u for PE  stressed imp of daily vit d Orders: Venipuncture (16109) TLB-Lipid Panel (80061-LIPID) TLB-BMP (Basic Metabolic Panel-BMET) (80048-METABOL) TLB-CBC Platelet - w/Differential (85025-CBCD) TLB-Hepatic/Liver Function Pnl (80076-HEPATIC) TLB-TSH (Thyroid Stimulating Hormone) (84443-TSH) T-Vitamin D (25-Hydroxy) (60454-09811) Specimen Handling (91478) Prescription Created Electronically [phone])  Problem # 2:  HYPERLIPIDEMIA (ICD-272.4) Assessment: Unchanged  with good low sat fat diet rev diet  lipitorhas not changes lab today Her updated medication list for this problem includes:    Lipitor 10 Mg Tabs (Atorvastatin calcium) .[name]... 1 by mouth every evening  Orders: Venipuncture (13086) TLB-Lipid Panel (80061-LIPID) TLB-BMP (Basic Metabolic Panel-BMET) (80048-METABOL) TLB-CBC Platelet - w/Differential (85025-CBCD) TLB-Hepatic/Liver Function Pnl (80076-HEPATIC) TLB-TSH (Thyroid Stimulating Hormone) (84443-TSH) T-Vitamin D (25-Hydroxy)  [phone]) Prescription Created Electronically [phone])  Labs Reviewed: SGOT: 22 (07/19/2008)   SGPT: 17 (07/19/2008)   HDL:54.10 (07/19/2008), 49.0 (07/21/2007)  LDL:105 (07/19/2008), 110 (07/21/2007)  Chol:181 (07/19/2008), 176 (07/21/2007)  Trig:109.0 (07/19/2008), 83 (07/21/2007)  Problem # 3:  GASTROPARESIS (ICD-536.3) Assessment: Comment Only  this is helped by reglan/ tx by GI doctor s/p workup may be rel to pref radiation  Orders: Prescription Created Electronically [phone])  Problem # 4:  ANXIETY (ICD-300.00) Assessment: New  tx by her oncologist -- is doing well with celexa but it makes her sleepy I adv her to try it at bedtime  Her updated medication list for this problem includes:    Celexa 20 Mg Tabs (Citalopram hydrobromide) .[name]... Take 1 tablet by mouth once a day in evening    Xanax 0.5 Mg Tabs (Alprazolam) .[name]... As needed  Orders: Prescription Created Electronically [phone])  Complete Medication List: 1)  Lipitor 10 Mg Tabs (Atorvastatin calcium) .[name].. 1 by mouth every evening 2)  Ropinirole Hcl 1 Mg Tabs (Ropinirole hcl) .... 2 by mouth at bedtime 3)  Viactiv Multi-vitamin Chew (Multiple vitamins-calcium) .... One by mouth daily 4)  Omeprazole 20 Mg Cpdr (Omeprazole) .[name].. 1 by mouth once daily 5)  Reglan 10 Mg Tabs (Metoclopramide hcl) .... Take one tab four times a day 6)  Celexa 20 Mg Tabs (Citalopram hydrobromide) .... Take 1 tablet by mouth once a day in evening 7)  Flonase 50 Mcg/act Susp (Fluticasone propionate) .... 2 sprays  each nostril at bedtime 8)  Xanax 0.5 Mg Tabs (Alprazolam) .... As needed 9)  Tramadol Hcl 50 Mg Tabs (Tramadol hcl) .... As needed  Patient Instructions: 1)  try taking the celexa at bedtime  2)  labs today  3)  keep up the good work with healthy diet and exercise  4)  schedule yearly physical when able  Prescriptions: CELEXA 20 MG TABS (CITALOPRAM HYDROBROMIDE) Take 1 tablet by mouth once a day in evening  #30 x 11   Entered  and Authorized by:   Judith Part MD   Signed by:   Judith Part MD on 08/26/2009   Method used:   Electronically to        [facility]. [address] [phone]* (retail)       [address] [place], [place]  [address]       Ph: [phone]       Fax: [phone]   RxID:   [id number] OMEPRAZOLE 20 MG CPDR (OMEPRAZOLE) 1 by mouth once daily  #30 x 11   Entered and Authorized by:   Judith Part MD   Signed by:   Judith Part MD on 08/26/2009   Method used:   Electronically to        [facility]. [address] [phone]* (retail)       [address] [place], [place]  [id number]       Ph: [phone]       Fax: [phone]   RxID:   [id number] LIPITOR 10 MG TABS (ATORVASTATIN CALCIUM) 1 by mouth every evening  #30 x 11   Entered and Authorized by:   Judith Part MD   Signed by:   Judith Part MD on 08/26/2009   Method used:   Electronically to        [facility]. [address] [phone]* (retail)       [address] [place], [place]  [id number]       Ph: [phone]       Fax: [phone]   RxID:   [id number]   Current Allergies (reviewed today): ! * DIFLUCAN ! AMOXICILLIN   Preventive Care Screening  Mammogram:    Date:  03/19/2009    Results:  normal

## 2010-03-18 NOTE — Assessment & Plan Note (Signed)
Summary: VIT B12 INJECTION/RI  Nurse Visit   Allergies: 1)  ! * Diflucan 2)  ! Amoxicillin  Medication Administration  Injection # 1:    Medication: Vit B12 1000 mcg    Diagnosis: VITAMIN B12 DEFICIENCY (ICD-266.2)    Route: IM    Site: L deltoid    Exp Date: 05/18/2011    Lot #: [id number]    Mfr: American Regent    Patient tolerated injection without complications    Given by: Lewanda Rife LPN (November 13, 2009 12:49 PM)  Orders Added: 1)  Vit B12 1000 mcg [J3420] 2)  Admin of Therapeutic Inj  intramuscular or subcutaneous [[id number]]

## 2010-03-18 NOTE — Assessment & Plan Note (Signed)
Summary: Suzanne Strong B12/[facility]  Nurse Visit   Allergies: 1)  ! * Diflucan 2)  ! Amoxicillin  Medication Administration  Injection # 1:    Medication: Vit B12 1000 mcg    Diagnosis: VITAMIN B12 DEFICIENCY (ICD-266.2)    Route: IM    Site: R deltoid    Exp Date: 05/2011    Lot #: [id number]    Mfr: American Regent    Patient tolerated injection without complications    Given by: Lowella Petties CMA (September 25, 2009 12:11 PM)  Orders Added: 1)  Vit B12 1000 mcg [J3420] 2)  Admin of Therapeutic Inj  intramuscular or subcutaneous [96372]   Medication Administration  Injection # 1:    Medication: Vit B12 1000 mcg    Diagnosis: VITAMIN B12 DEFICIENCY (ICD-266.2)    Route: IM    Site: R deltoid    Exp Date: 05/2011    Lot #: [id number]    Mfr: American Regent    Patient tolerated injection without complications    Given by: Lowella Petties CMA (September 25, 2009 12:11 PM)  Orders Added: 1)  Vit B12 1000 mcg [J3420] 2)  Admin of Therapeutic Inj  intramuscular or subcutaneous [62952]

## 2010-03-18 NOTE — Progress Notes (Signed)
Summary: Ropinirole  Phone Note Refill Request Message from:  [employer] on July 29, 2009 12:18 PM  Refills Requested: Medication #1:  ROPINIROLE HCL 1 MG  TABS 2 by mouth at bedtime [facility] [address] [place] #[id number]*   Last Lenox Ahr Date:  07/04/2009   Pharmacy Phone:  [phone]   Method Requested: Electronic Initial call taken by: Delilah Shan CMA Duncan Dull),  July 29, 2009 12:18 PM  Follow-up for Phone Call        px written on EMR for call in  Follow-up by: Judith Part MD,  July 29, 2009 1:20 PM    Prescriptions: ROPINIROLE HCL 1 MG  TABS (ROPINIROLE HCL) 2 by mouth at bedtime  #60 x 11   Entered by:   Delilah Shan CMA (AAMA)   Authorized by:   Judith Part MD   Signed by:   Delilah Shan CMA Duncan Dull) on 07/29/2009   Method used:   Electronically to        [facility] [phone]* (retail)       [address] [place], [place]  [address]       Ph: [phone] or [phone]       Fax: [phone]   RxID:   [id number]

## 2010-03-18 NOTE — Assessment & Plan Note (Signed)
Summary: TOWER B12/[facility]  Nurse Visit   Allergies: 1)  ! * Diflucan 2)  ! Amoxicillin  Medication Administration  Injection # 1:    Medication: Vit B12 1000 mcg    Diagnosis: VITAMIN B12 DEFICIENCY (ICD-266.2)    Route: IM    Site: L deltoid    Exp Date: 01/17/2011    Lot #: [id number]    Mfr: American Regent    Patient tolerated injection without complications    Given by: Lewanda Rife LPN (September 18, 2009 12:08 PM)  Orders Added: 1)  Vit B12 1000 mcg [J3420] 2)  Admin of Therapeutic Inj  intramuscular or subcutaneous [73710]

## 2010-03-18 NOTE — Consult Note (Signed)
Summary: [facility]  [facility]   Imported By: Lanelle Bal 07/31/2009 13:11:39  _____________________________________________________________________  External Attachment:    Type:   Image     Comment:   External Document

## 2010-03-18 NOTE — Assessment & Plan Note (Signed)
Summary: FOLLOW UP AFTER LABS/RI   Vital Signs:  Patient profile:   65 year old female Height:      65 inches Weight:      182.12 pounds BMI:     30.42 Temp:     97.9 degrees F oral Pulse rate:   72 / minute Pulse rhythm:   regular BP sitting:   112 / 80  (left arm) Cuff size:   regular  Vitals Entered By: Benny Lennert CMA Duncan Dull) (January 01, 2010 1:53 PM)  History of Present Illness: Chief complaint follow up after labs here for f/u of iron def anemia and B12 def   nl cbc this draw with hb of 12.8- best yet -- notices imp in energy    B12 is 306 (down from the 500s) does not mind coming for B12 shots  also taking bcomplex   diet is pretty balanced  not enough fish as she should   on iron daily 325-- no problems at all  on B12 shots every 6 weeks   ? of malabsorbtion  went and had a colonoscopy-- follow up yesterday had to be rescheduled  not bad at all  no report yet    went to gyn -- and was dx with a fibroid - may end up with hysterectomy just uncomfortable more than anything   is unsure if she is to get a flu shot at the oncol office dec 1   Allergies: 1)  ! * Diflucan 2)  ! 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 basal  cell , CHF Mother: high chol, skin ca -? 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 Eyes:  Denies blurring and eye irritation. CV:  Denies chest pain or discomfort, lightheadness, and palpitations. Resp:  Denies cough, shortness of breath, and wheezing. GI:  Denies abdominal pain, change in bowel habits, constipation, dark tarry stools, diarrhea, indigestion, nausea, and vomiting. GU:  Denies hematuria. MS:  Denies muscle aches and cramps. Derm:  Denies poor wound healing and rash. Neuro:  Denies headaches, numbness, and tingling; no restless legs. Psych:  Denies anxiety and depression. Endo:  Denies cold intolerance, excessive thirst, excessive urination, and heat intolerance. Heme:  Denies abnormal bruising and bleeding.  Physical Exam  General:  overweight but generally well appearing  Head:  normocephalic, atraumatic, and no abnormalities observed.   Eyes:  vision grossly intact, pupils equal, pupils round, and pupils reactive to light.  no  conjunctival pallor, injection or icterus  Mouth:  pharynx pink and moist.   Neck:  supple with full rom and no masses or thyromegally, no JVD or carotid bruit  Chest Wall:  No deformities, masses, or tenderness noted. Lungs:  Normal respiratory effort, chest expands symmetrically. Lungs are clear to auscultation, no crackles or wheezes. Heart:  Normal rate and regular rhythm. S1 and S2 normal without gallop, murmur, click, rub or other extra sounds. Abdomen:  Bowel sounds  positive,abdomen soft and non-tender without masses, organomegaly or hernias noted. Extremities:  No clubbing, cyanosis, edema, or deformity noted with normal full range of motion of all joints.   Neurologic:  no tremor  gait normal and DTRs symmetrical and normal.   Skin:  Intact without suspicious lesions or rashes no pallor or jaundice  Cervical Nodes:  No lymphadenopathy noted Inguinal Nodes:  No significant adenopathy Psych:  normal affect, talkative and pleasant    Impression & Recommendations:  Problem # 1:  VITAMIN B12 DEFICIENCY (ICD-266.2) Assessment Deteriorated  level down a bit so will bump B12 shots to monthly  one today  continue oral suppl also  Orders: Vit B12 1000 mcg (J3420) Admin of Therapeutic Inj  intramuscular or subcutaneous (21308)  Problem # 2:  UNSPECIFIED ANEMIA (ICD-285.9) iron def anemia is imp with once daily dosing and nl cbc  will continue that  r echeck 6 mo and f/u  will also fu with her GI doc to rev colonosc report  Her updated medication list for this problem includes:    Cyanocobalamin 1000 Mcg/ml Soln (Cyanocobalamin) .[name]... Get vit b 12 injection every four    Ferrous Sulfate 325 (65 Fe) Mg Tbec (Ferrous sulfate) .[name]... Take 1 tablet by mouth once a day  Complete Medication List: 1)  Lipitor 10 Mg Tabs (Atorvastatin calcium) .[name].. 1 by mouth every evening 2)  Ropinirole Hcl 1 Mg Tabs (Ropinirole hcl) .... 2 by mouth at bedtime 3)  Viactiv Multi-vitamin Chew (Multiple vitamins-calcium) .... One by mouth daily 4)  Omeprazole 20 Mg Cpdr (Omeprazole) .[name].. 1 by mouth once daily 5)  Reglan 10 Mg Tabs (Metoclopramide hcl) .... Take one tab four times a day 6)  Celexa 20 Mg Tabs (Citalopram hydrobromide) .... Take 1 tablet by mouth once a day in evening 7)  Flonase 50 Mcg/act Susp (Fluticasone propionate) .... 2 sprays each nostril at bedtime 8)  Xanax 0.5 Mg Tabs (Alprazolam) .... As needed 9)  Tramadol Hcl 50 Mg Tabs (Tramadol hcl) .... As  needed 10)  Vitamin B Complex-c Caps (B complex-c) .... Take 1 capsule by mouth once a day 11)  Cyanocobalamin 1000 Mcg/ml Soln (Cyanocobalamin) .... Get vit b 12 injection every four 12)  Ferrous Sulfate 325 (65 Fe) Mg Tbec (Ferrous sulfate) .... Take 1 tablet by mouth once a day  Patient Instructions: 1)  your blood count is better but I want to make B12 shots more frequent (monthly )  2)  shot today 3)  schedule next B12 shot one month ( monthly after that ) 4)  schedule labs in 6 months fasting lipid/ast/alt/renal/ vit D / cbc with diff/ ferritin and B12 level 272, low D/ edema / 266.2 and 285.9 5)  then follow up   Medication Administration  Injection # 1:    Medication: Vit B12 1000 mcg    Diagnosis: VITAMIN B12 DEFICIENCY (ICD-266.2)    Route: IM    Site: L deltoid    Exp Date: 06/17/2011    Lot #:  [id number]    Mfr: [employer]    Patient tolerated injection without complications    Given by: Benny Lennert CMA (AAMA) (January 01, 2010 2:24 PM)  Orders Added: 1)  Vit B12 1000 mcg [J3420] 2)  Admin of Therapeutic Inj  intramuscular or subcutaneous [96372] 3)  Est. Patient Level III [11914]    Current Allergies (reviewed today): ! * DIFLUCAN ! AMOXICILLIN

## 2010-03-18 NOTE — Progress Notes (Signed)
  Phone Note Other Incoming   Request: Send information Summary of Call: Request for records received from [facility]. Request forwarded to [facility].

## 2010-03-18 NOTE — Assessment & Plan Note (Signed)
Summary: ? BLADDER PROLAPSE   Vital Signs:  Patient profile:   65 year old female Height:      65 inches Weight:      181.25 pounds BMI:     30.27 Temp:     98.1 degrees F oral Pulse rate:   68 / minute Pulse rhythm:   regular BP sitting:   120 / 82  (left arm) Cuff size:   regular  Vitals Entered By: Lewanda Rife LPN (November 26, 2009 12:45 PM) CC: ?bladder prolapse and voiding less urine, Back Pain   History of Present Illness: feels like a lot of pressure over pubic area almost felt like a cramp -  then frequency of urine -and gets there and not a lot to go  no burning  no vaginal discharge or itching no odor   a little urine leak sunday with a sneeze that is unusual  never before or since   can feel bladder dropping   pap nl 6/10  never had surgery or hyst   Allergies: 1)  ! * Diflucan 2)  ! 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 basal cell , CHF Mother: high chol, skin ca -? 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 Eyes:  Denies blurring and eye irritation. CV:  Denies chest pain or discomfort, lightheadness, palpitations, and shortness of breath with exertion. Resp:  Denies cough and wheezing. GI:  Complains of abdominal pain; denies change in bowel habits and nausea. GU:  Complains of discharge, dysuria, and hematuria; denies urinary frequency. MS:  Denies joint pain, joint redness, and joint swelling. Derm:  Denies itching, lesion(s), poor wound healing, and rash. Neuro:  Denies numbness and tingling. Psych:  Denies anxiety and depression. Endo:  Denies cold intolerance, excessive thirst, excessive urination, and heat intolerance. Heme:  Denies abnormal bruising and bleeding.  Physical Exam  General:  overweight but generally well appearing  Head:  normocephalic, atraumatic, and no abnormalities observed.   Eyes:  vision grossly intact, pupils equal, pupils round, and pupils reactive to light.  no conjunctival pallor, injection or icterus  Mouth:  pharynx pink and moist.   Neck:  supple with full rom and no masses or thyromegally, no JVD or carotid bruit  Chest Wall:  No deformities, masses, or tenderness noted. Lungs:  Normal respiratory effort, chest expands symmetrically. Lungs are clear to auscultation, no crackles or wheezes. Heart:  Normal rate and regular rhythm.  S1 and S2 normal without gallop, murmur, click, rub or other extra sounds. Abdomen:  very mild suprapubic tenderness without rebound or gaurding   Genitalia:  introitus is very tight and pt is uncomforable with exam no M felt  no prolapse is noted - uterine or bladder  no mucosal change or odor or d/c Msk:  No deformity or scoliosis noted of thoracic or lumbar  spine.  no CVA tenderness  Extremities:  No clubbing, cyanosis, edema, or deformity noted with normal full range of motion of all joints.   Skin:  Intact without suspicious lesions or rashes Cervical Nodes:  No lymphadenopathy noted Inguinal Nodes:  No significant adenopathy Psych:  normal affect, talkative and pleasant    Impression & Recommendations:  Problem # 1:  PELVIC  PAIN (ICD-789.09) Assessment New new pelvic pain / pressure with neg ua pelvic exam is uncomfortable - which is baseline no significant prolapse noted  will check pelvic ultrasound  also address constipation with miralax and update  Her updated medication list for this problem includes:    Tramadol Hcl 50 Mg Tabs (Tramadol hcl) .[name]... As needed  Orders: Radiology Referral (Radiology) UA Dipstick w/o Micro (manual) (54098)  Complete Medication List: 1)  Lipitor 10 Mg Tabs (Atorvastatin calcium) .[name].. 1 by mouth every evening 2)  Ropinirole Hcl 1 Mg Tabs (Ropinirole hcl) .... 2 by mouth at bedtime 3)  Viactiv Multi-vitamin Chew (Multiple vitamins-calcium) .... One by mouth daily 4)  Omeprazole 20 Mg Cpdr (Omeprazole) .[name].. 1 by mouth once daily 5)  Reglan 10 Mg Tabs (Metoclopramide hcl) .... Take one tab four times a day 6)  Celexa 20 Mg Tabs (Citalopram hydrobromide) .... Take 1 tablet by mouth once a day in evening 7)  Flonase 50 Mcg/act Susp (Fluticasone propionate) .... 2 sprays each nostril at bedtime 8)  Xanax 0.5 Mg Tabs (Alprazolam) .... As needed 9)  Tramadol Hcl 50 Mg Tabs (Tramadol hcl) .... As needed 10)  Vitamin B Complex-c Caps (B complex-c) .... Take 1 capsule by mouth once a day 11)  Cyanocobalamin 1000 Mcg/ml Soln (Cyanocobalamin) .... Get vit b 12 injection every six weeks. 12)  Ferrous Sulfate 325 (65 Fe) Mg Tbec (Ferrous sulfate) .... Take 1 tablet by mouth once a day  Patient Instructions: 1)  we will set up pelvic ultrasound at check out  2)  keep drinking lots of fluids  3)  if symptoms  worsen in the meantime - update me  4)  try some miralax over the counter 1 capful with water as directed once daily or every other day   Current Allergies (reviewed today): ! * DIFLUCAN ! AMOXICILLIN  Laboratory Results   Urine Tests  Date/Time Received: November 26, 2009 12:47 PM  Date/Time Reported: November 26, 2009 12:47 PM    Routine Urinalysis   Color: yellow Appearance: Clear Glucose: negative   (Normal Range: Negative) Bilirubin: negative   (Normal Range: Negative) Ketone: negative   (Normal Range: Negative) Spec. Gravity: 1.010   (Normal Range: 1.003-1.035) Blood: negative   (Normal Range: Negative) pH: 6.0   (Normal Range: 5.0-8.0) Protein: negative   (Normal Range: Negative) Urobilinogen: 0.2   (Normal Range: 0-1) Nitrite: negative   (Normal Range: Negative) Leukocyte Esterace: negative   (Normal Range: Negative)  Urine Microscopic WBC/HPF: 0 RBC/HPF: 0 Bacteria/HPF: 0 Mucous/HPF: few Epithelial/HPF: 0-1 Crystals/HPF: 0 Casts/LPF: 0 Yeast/HPF: 0 Other: 0          Laboratory Results   Urine Tests  Routine Urinalysis   Color: yellow Appearance: Clear Glucose: negative   (Normal Range: Negative) Bilirubin: negative   (Normal Range: Negative) Ketone: negative   (Normal Range: Negative) Spec. Gravity: 1.010   (Normal Range: 1.003-1.035) Blood: negative   (Normal Range: Negative) pH: 6.0   (Normal Range: 5.0-8.0) Protein: negative   (Normal Range: Negative) Urobilinogen: 0.2   (Normal Range: 0-1) Nitrite: negative   (Normal Range: Negative) Leukocyte Esterace: negative   (Normal Range: Negative)  Urine Microscopic WBC/hpf: 0 RBC/hpf: 0 Bacteria: 0 Mucous: few Epithelial: 0-1 Crystals/LPF: 0 Casts/LPF: 0 Yeast/HPF: 0 Other: 0

## 2010-03-18 NOTE — Assessment & Plan Note (Signed)
Summary: Suzanne Strong B/12/[facility]  Nurse Visit   Allergies: 1)  ! * Diflucan 2)  ! Amoxicillin  Medication Administration  Injection # 1:    Medication: Vit B12 1000 mcg    Diagnosis: VITAMIN B12 DEFICIENCY (ICD-266.2)    Route: IM    Site: R deltoid    Exp Date: 01/17/2011    Lot #: [id number]    Mfr: [employer]    Patient tolerated injection without complications    Given by: Lewanda Rife LPN (September 11, 2009 12:01 PM)  Orders Added: 1)  Vit B12 1000 mcg [J3420] 2)  Admin of Therapeutic Inj  intramuscular or subcutaneous [[id number]]

## 2010-03-18 NOTE — Letter (Signed)
Summary: [facility]  [facility]   Imported By: Lanelle Bal 01/08/2010 15:09:47  _____________________________________________________________________  External Attachment:    Type:   Image     Comment:   External Document

## 2010-03-20 NOTE — Assessment & Plan Note (Signed)
Summary: B12 SHOT / [facility]  Nurse Visit   Allergies: 1)  ! * Diflucan 2)  ! Amoxicillin  Medication Administration  Injection # 1:    Medication: Vit B12 1000 mcg    Diagnosis: VITAMIN B12 DEFICIENCY (ICD-266.2)    Route: IM    Site: L deltoid    Exp Date: 12/17/2011    Lot #: [id number]    Mfr: [employer]    Comments: Per Dr. Milinda Antis    Patient tolerated injection without complications    Given by: Selena Batten Dance CMA Duncan Dull) (March 04, 2010 11:51 AM)  Orders Added: 1)  Vit B12 1000 mcg [J3420] 2)  Admin of Therapeutic Inj  intramuscular or subcutaneous [[id number]]

## 2010-04-04 ENCOUNTER — Encounter: Payer: Self-pay | Admitting: Family Medicine

## 2010-04-04 ENCOUNTER — Ambulatory Visit (INDEPENDENT_AMBULATORY_CARE_PROVIDER_SITE_OTHER): Payer: BC Managed Care – PPO

## 2010-04-04 DIAGNOSIS — E538 Deficiency of other specified B group vitamins: Secondary | ICD-10-CM

## 2010-04-09 NOTE — Assessment & Plan Note (Signed)
Summary: B12/[facility]  Nurse Visit   Allergies: 1)  ! * Diflucan 2)  ! Amoxicillin  Medication Administration  Injection # 1:    Medication: Vit B12 1000 mcg    Diagnosis: VITAMIN B12 DEFICIENCY (ICD-266.2)    Route: IM    Site: R deltoid    Exp Date: 12/17/2011    Lot #: [id number]    Mfr: [employer]    Comments: Per Dr. Milinda Antis    Patient tolerated injection without complications    Given by: Selena Batten Dance CMA (AAMA) (April 04, 2010 12:12 PM)  Orders Added: 1)  Vit B12 1000 mcg [J3420] 2)  Admin of Therapeutic Inj  intramuscular or subcutaneous [[id number]]

## 2010-04-14 ENCOUNTER — Ambulatory Visit: Payer: Self-pay | Admitting: Oncology

## 2010-04-14 ENCOUNTER — Encounter: Payer: Self-pay | Admitting: Family Medicine

## 2010-04-17 ENCOUNTER — Ambulatory Visit: Payer: Self-pay | Admitting: Oncology

## 2010-04-29 NOTE — Letter (Signed)
Summary: [facility]   [facility]   Imported By: Kassie Mends 04/22/2010 09:18:31  _____________________________________________________________________  External Attachment:    Type:   Image     Comment:   External Document

## 2010-04-29 NOTE — Letter (Signed)
Summary: [facility]  [facility]   Imported By: Lanelle Bal 04/22/2010 08:34:59  _____________________________________________________________________  External Attachment:    Type:   Image     Comment:   External Document

## 2010-05-05 ENCOUNTER — Encounter: Payer: Self-pay | Admitting: Family Medicine

## 2010-05-05 ENCOUNTER — Ambulatory Visit (INDEPENDENT_AMBULATORY_CARE_PROVIDER_SITE_OTHER): Payer: BC Managed Care – PPO

## 2010-05-05 DIAGNOSIS — E538 Deficiency of other specified B group vitamins: Secondary | ICD-10-CM

## 2010-05-08 ENCOUNTER — Other Ambulatory Visit: Payer: BC Managed Care – PPO

## 2010-05-15 NOTE — Assessment & Plan Note (Signed)
Summary: [facility] FR 05-08-2010-EPIC  Nurse Visit   Allergies: 1)  ! * Diflucan 2)  ! Amoxicillin  Medication Administration  Injection # 1:    Medication: Vit B12 1000 mcg    Diagnosis: VITAMIN B12 DEFICIENCY (ICD-266.2)    Route: IM    Site: L deltoid    Exp Date: 11/17/2011    Lot #: [id number]    Mfr: [employer]    Patient tolerated injection without complications    Given by: Lewanda Rife LPN (May 05, 2010 11:32 AM)  Orders Added: 1)  Vit B12 1000 mcg [J3420] 2)  Admin of Therapeutic Inj  intramuscular or subcutaneous [54098]

## 2010-05-18 ENCOUNTER — Ambulatory Visit: Payer: Self-pay | Admitting: Oncology

## 2010-06-05 ENCOUNTER — Ambulatory Visit (INDEPENDENT_AMBULATORY_CARE_PROVIDER_SITE_OTHER): Payer: BC Managed Care – PPO | Admitting: Family Medicine

## 2010-06-05 DIAGNOSIS — E538 Deficiency of other specified B group vitamins: Secondary | ICD-10-CM

## 2010-06-05 MED ORDER — CYANOCOBALAMIN 1000 MCG/ML IJ SOLN
1000.0000 ug | Freq: Once | INTRAMUSCULAR | Status: AC
  Administered 2010-06-05: 1000 ug via INTRAMUSCULAR

## 2010-06-05 NOTE — Progress Notes (Signed)
  Subjective:    Patient ID: Suzanne Strong, female    DOB: October 02, 1945, 65 y.o.   MRN: [id number]  HPI Here for injection   Review of Systems     Objective:   Physical Exam        Assessment & Plan:

## 2010-06-26 ENCOUNTER — Telehealth: Payer: Self-pay | Admitting: Family Medicine

## 2010-06-26 ENCOUNTER — Ambulatory Visit: Payer: Self-pay | Admitting: Oncology

## 2010-06-26 DIAGNOSIS — E559 Vitamin D deficiency, unspecified: Secondary | ICD-10-CM

## 2010-06-26 DIAGNOSIS — E785 Hyperlipidemia, unspecified: Secondary | ICD-10-CM

## 2010-06-26 DIAGNOSIS — Z0000 Encounter for general adult medical examination without abnormal findings: Secondary | ICD-10-CM | POA: Insufficient documentation

## 2010-06-26 DIAGNOSIS — E538 Deficiency of other specified B group vitamins: Secondary | ICD-10-CM

## 2010-06-26 NOTE — Telephone Encounter (Signed)
Message copied by Roxy Manns on Thu Jun 26, 2010  4:03 PM ------      Message from: Melody Comas      Created: Thu Jun 26, 2010  2:02 PM      Regarding: Lab orders        Patient is coming in for labs on Monday 06-30-10. Please put orders in. Thanks.

## 2010-06-30 ENCOUNTER — Other Ambulatory Visit (INDEPENDENT_AMBULATORY_CARE_PROVIDER_SITE_OTHER): Payer: BC Managed Care – PPO | Admitting: Family Medicine

## 2010-06-30 DIAGNOSIS — Z13220 Encounter for screening for lipoid disorders: Secondary | ICD-10-CM

## 2010-06-30 DIAGNOSIS — Z0000 Encounter for general adult medical examination without abnormal findings: Secondary | ICD-10-CM

## 2010-06-30 DIAGNOSIS — E785 Hyperlipidemia, unspecified: Secondary | ICD-10-CM

## 2010-06-30 DIAGNOSIS — E559 Vitamin D deficiency, unspecified: Secondary | ICD-10-CM

## 2010-06-30 DIAGNOSIS — E538 Deficiency of other specified B group vitamins: Secondary | ICD-10-CM

## 2010-06-30 LAB — COMPREHENSIVE METABOLIC PANEL
Alkaline Phosphatase: 77 U/L (ref 39–117)
BUN: 12 mg/dL (ref 6–23)
CO2: 28 mEq/L (ref 19–32)
GFR: 76.01 mL/min (ref >60.00)
Glucose, Bld: 131 mg/dL — ABNORMAL HIGH (ref 70–99)
Total Bilirubin: 0.5 mg/dL (ref 0.3–1.2)
Total Protein: 6.7 g/dL (ref 6.0–8.3)

## 2010-06-30 LAB — TSH: TSH: 1.74 u[IU]/mL (ref 0.35–5.50)

## 2010-06-30 LAB — CBC WITH DIFFERENTIAL/PLATELET
Basophils Relative: 0.4 % (ref 0.0–3.0)
Eosinophils Relative: 3 % (ref 0.0–5.0)
HCT: 38.3 % (ref 36.0–46.0)
Hemoglobin: 13.1 g/dL (ref 12.0–15.0)
Lymphs Abs: 1.1 10*3/uL (ref 0.7–4.0)
MCV: 90.8 fl (ref 78.0–100.0)
Monocytes Absolute: 0.3 10*3/uL (ref 0.1–1.0)
Monocytes Relative: 5.4 % (ref 3.0–12.0)
Neutro Abs: 3.9 10*3/uL (ref 1.4–7.7)
RBC: 4.22 Mil/uL (ref 3.87–5.11)
WBC: 5.5 10*3/uL (ref 4.5–10.5)

## 2010-06-30 LAB — LDL CHOLESTEROL, DIRECT: Direct LDL: 114 mg/dL

## 2010-06-30 LAB — LIPID PANEL
Cholesterol: 188 mg/dL (ref 0–200)
HDL: 44.4 mg/dL (ref >39.00)
VLDL: 44.6 mg/dL — ABNORMAL HIGH (ref 0.0–40.0)

## 2010-07-01 LAB — VITAMIN D 25 HYDROXY (VIT D DEFICIENCY, FRACTURES): Vit D, 25-Hydroxy: 24 ng/mL — ABNORMAL LOW (ref 30–89)

## 2010-07-05 ENCOUNTER — Encounter: Payer: Self-pay | Admitting: Family Medicine

## 2010-07-07 ENCOUNTER — Encounter: Payer: Self-pay | Admitting: Family Medicine

## 2010-07-07 ENCOUNTER — Ambulatory Visit (INDEPENDENT_AMBULATORY_CARE_PROVIDER_SITE_OTHER): Payer: BC Managed Care – PPO | Admitting: Family Medicine

## 2010-07-07 VITALS — BP 98/60 | HR 72 | Temp 98.0°F | Ht 65.0 in | Wt 192.2 lb

## 2010-07-07 DIAGNOSIS — D649 Anemia, unspecified: Secondary | ICD-10-CM

## 2010-07-07 DIAGNOSIS — E538 Deficiency of other specified B group vitamins: Secondary | ICD-10-CM

## 2010-07-07 DIAGNOSIS — E1165 Type 2 diabetes mellitus with hyperglycemia: Secondary | ICD-10-CM | POA: Insufficient documentation

## 2010-07-07 DIAGNOSIS — E559 Vitamin D deficiency, unspecified: Secondary | ICD-10-CM

## 2010-07-07 DIAGNOSIS — R7309 Other abnormal glucose: Secondary | ICD-10-CM

## 2010-07-07 DIAGNOSIS — F329 Major depressive disorder, single episode, unspecified: Secondary | ICD-10-CM | POA: Insufficient documentation

## 2010-07-07 DIAGNOSIS — R739 Hyperglycemia, unspecified: Secondary | ICD-10-CM

## 2010-07-07 DIAGNOSIS — E785 Hyperlipidemia, unspecified: Secondary | ICD-10-CM

## 2010-07-07 MED ORDER — CYANOCOBALAMIN 1000 MCG/ML IJ SOLN
1000.0000 ug | Freq: Once | INTRAMUSCULAR | Status: AC
  Administered 2010-07-07: 1000 ug via INTRAMUSCULAR

## 2010-07-07 NOTE — Assessment & Plan Note (Signed)
Improved with daily iron Will continue this

## 2010-07-07 NOTE — Patient Instructions (Signed)
Please get vitamin D3 over the counter and take 2000 iu a day  Get sugar out of diet - no sweets/ no sugar drinks including juice (artificial sweetener and diet soda ok )  Limit carbohydrates - fruit , rice, bread, pasta  Eat lots of green vegetables and lean proteins   Get back to exercise when ready  Stop neurontin for 2 weeks-- then call and leave me a message -- update me with how mood is  Schedule follow up in 3 months with labs prior

## 2010-07-07 NOTE — Progress Notes (Signed)
Subjective:    Patient ID: Suzanne Strong, female    DOB: [birth year], 65 y.o.   MRN: [id number]  HPI Here for f/u of B12 and D def ad well as lipids and anemia and hyperglycemia Also ? depression   Overall not feeling really great Tired and does not want to get out of bed  Nothing interests her  Does not want to be social or cook  This has happened on and off Not enjoying things that she used to  No suicidal thoughts or plans   On neurontin -- and that has helps a lot with her chronic neck pain   Is on celexa -- was on that since dx with cancer - no changes  Was depressed initally then also   B12 is 387 with monthly shot and Bcomplex vit Daily  D level is 24- down from 35 Is taking viactiv - one per day   Lipids-- has always been good with her diet  Gained some wt from lack of activity-- used to work out - Systems analyst and walk - just not motivated  Lab Results  Component Value Date   CHOL 188 06/30/2010   CHOL 162 08/26/2009   CHOL 181 07/19/2008   Lab Results  Component Value Date   HDL 44.40 06/30/2010   HDL 29.56 08/26/2009   HDL 21.30 07/19/2008   Lab Results  Component Value Date   LDLCALC 93 08/26/2009   LDLCALC 105* 07/19/2008   LDLCALC 110* 07/21/2007   Lab Results  Component Value Date   TRIG 223.0* 06/30/2010   TRIG 110.0 08/26/2009   TRIG 109.0 07/19/2008   Lab Results  Component Value Date   CHOLHDL 4 06/30/2010   CHOLHDL 3 08/26/2009   CHOLHDL 3 07/19/2008   Lab Results  Component Value Date   LDLDIRECT 114.0 06/30/2010    Anemia is imp with daily iron --hb is 13.1  Glucose 131-- has never  Is a sugar eater - she does crave it -- although cut back ever since she had cancer  No excessiver thirst or urination No vision change No numbness in feet   Past Medical History  Diagnosis Date  . Hyperlipidemia   . Tonsillar cancer   . Diverticulosis   . Shingles     Hx of  . Gastroparesis     related to previous radiation tx  . Anxiety   . Anemia      iron deficiency and B12 deficiency  . Cervical dysplasia     conization    History   Social History  . Marital Status: Married    Spouse Name: N/A    Number of Children: N/A  . Years of Education: N/A   Occupational History  . Not on file.   Social History Main Topics  . Smoking status: Former Games developer  . Smokeless tobacco: Not on file  . Alcohol Use:   . Drug Use:   . Sexually Active:    Other Topics Concern  . Not on file   Social History Narrative  . No narrative on file    Family History  Problem Relation Age of Onset  . Osteoporosis Mother   . Hyperlipidemia Mother   . Depression Mother   . Cancer Mother     skin cancer ? basal cell and lung ca heavy smoker  . Alcohol abuse Father   . Cancer Father     skin CA ? basal cell  . Heart disease Father  CHF  . Hyperlipidemia Brother   . Hypertension Brother     Allergies  Allergen Reactions  . Amoxicillin     REACTION: rash  . Fluconazole     REACTION: hives           Review of Systems Review of Systems  Constitutional: Negative for fever, appetite change, fatigue and unexpected weight change.  Eyes: Negative for pain and visual disturbance.  Respiratory: Negative for cough and shortness of breath.   Cardiovascular: Negative.   Gastrointestinal: Negative for nausea, diarrhea and constipation.  Genitourinary: Negative for urgency and frequency.  Skin: Negative for pallor.  Neurological: Negative for weakness, light-headedness, numbness and headaches.  Hematological: Negative for adenopathy. Does not bruise/bleed easily.  Psychiatric/Behavioral: pos for depressive symptoms/ neg for SI / some anxiety        Objective:   Physical Exam  Constitutional: She appears well-developed and well-nourished. No distress.       overwt and well appearing   HENT:  Head: Normocephalic and atraumatic.  Mouth/Throat: Oropharynx is clear and moist.  Eyes: Conjunctivae and EOM are normal. Pupils are equal,  round, and reactive to light.  Neck: Normal range of motion. Neck supple. No JVD present. Carotid bruit is not present. No thyromegaly present.  Cardiovascular: Normal rate, regular rhythm and normal heart sounds.   Pulmonary/Chest: Effort normal and breath sounds normal. No respiratory distress. She has no wheezes.  Abdominal: Soft. Bowel sounds are normal. She exhibits no distension and no mass. There is no tenderness.  Musculoskeletal: Normal range of motion. She exhibits no edema and no tenderness.  Lymphadenopathy:    She has no cervical adenopathy.  Neurological: She is alert. She has normal reflexes. No cranial nerve deficit. Coordination normal.  Skin: Skin is warm and dry. No rash noted. No erythema. No pallor.  Psychiatric:       Seems generally down but not tearful Some psychomotor slowing  Nl eye contact and  Comm skills           Assessment & Plan:

## 2010-07-07 NOTE — Assessment & Plan Note (Signed)
Shot today Level stable with monthly shots and Bcomplex vit otc

## 2010-07-07 NOTE — Assessment & Plan Note (Signed)
Too low Add 2000 iu vit D3 otc daily  Pt voiced understanding

## 2010-07-07 NOTE — Assessment & Plan Note (Signed)
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

## 2010-07-07 NOTE — Assessment & Plan Note (Signed)
New with sugar of 131 fasting after a peroid of inactivity and poor diet Rev lab with pt  Rev low glycemic diet  F/u 3 mo after a1c

## 2010-07-07 NOTE — Assessment & Plan Note (Signed)
This is fairly stable Rev lab with pt  Rev low sat fat diet  LDL dir is in one- teens- disc goals for this

## 2010-07-11 ENCOUNTER — Other Ambulatory Visit: Payer: Self-pay | Admitting: Family Medicine

## 2010-07-17 ENCOUNTER — Telehealth: Payer: Self-pay | Admitting: *Deleted

## 2010-07-17 NOTE — Telephone Encounter (Signed)
Pt was told to stop gabapentin 2 weeks ago and call back with report.  She is still sleepy all the time- this is some better but still there.  She has also developed a cough since about a week ago.  This is worse when she lays down at night- taking robitussin but that isnt helping.  Please advise.

## 2010-07-17 NOTE — Telephone Encounter (Signed)
Thanks for the update  Stay off the gabapentin for now  Follow up for the cough please (in meantime update me if fever or sob) We can disc status of mood/ depressive symptoms at that visit as well and make a long term plan

## 2010-07-17 NOTE — Telephone Encounter (Signed)
Patient notified as instructed by telephone. Pt scheduled appt with Dr Milinda Antis 07/25/10 at 3:15pm. Pt will call back if fever or sob or other concerning symptoms prior to appt.

## 2010-07-18 ENCOUNTER — Ambulatory Visit: Payer: Self-pay | Admitting: Oncology

## 2010-07-25 ENCOUNTER — Ambulatory Visit (INDEPENDENT_AMBULATORY_CARE_PROVIDER_SITE_OTHER): Payer: BC Managed Care – PPO | Admitting: Family Medicine

## 2010-07-25 ENCOUNTER — Encounter: Payer: Self-pay | Admitting: Family Medicine

## 2010-07-25 DIAGNOSIS — M542 Cervicalgia: Secondary | ICD-10-CM

## 2010-07-25 DIAGNOSIS — J019 Acute sinusitis, unspecified: Secondary | ICD-10-CM | POA: Insufficient documentation

## 2010-07-25 DIAGNOSIS — G8929 Other chronic pain: Secondary | ICD-10-CM | POA: Insufficient documentation

## 2010-07-25 DIAGNOSIS — F329 Major depressive disorder, single episode, unspecified: Secondary | ICD-10-CM

## 2010-07-25 MED ORDER — HYDROCOD POLST-CHLORPHEN POLST 10-8 MG/5ML PO LQCR: 5.0000 mL | Freq: Two times a day (BID) | ORAL | Status: DC | PRN

## 2010-07-25 MED ORDER — GABAPENTIN 300 MG PO CAPS: 300.0000 mg | ORAL_CAPSULE | Freq: Every day | ORAL | Status: DC

## 2010-07-25 MED ORDER — CITALOPRAM HYDROBROMIDE 40 MG PO TABS: 40.0000 mg | ORAL_TABLET | Freq: Every day | ORAL | Status: DC

## 2010-07-25 MED ORDER — AZITHROMYCIN 250 MG PO TABS: ORAL_TABLET | ORAL | Status: AC

## 2010-07-25 NOTE — Patient Instructions (Addendum)
For the sinus infection take zithromax  tussionex for cough  Drink lots of fluids Increase celexa to 40 mg total once a day after you are done with the zithromax  Hold px for neurontin until I know how you are doing (call and update me with how your depression is in about 2 weeks) Update me if worse with anything

## 2010-07-25 NOTE — Progress Notes (Signed)
Subjective:    Patient ID: Suzanne Strong, female    DOB: Jul 04, 1945, 65 y.o.   MRN: [id number]  HPI Here for f/u of depression and new cough  Last visit c/o lethargy/ fatigue/ anhedonia  This correlated with starting neurontin - so asked her to stop it  It got some better - more than 50% - but of course neck pain came back with a vengence  Is on celexa 20 mg -- but would be open to inc it   Does not want to get out of bed/ get up / do anything  Procrastinating  Is missing work more than she used to - convinces herself not to go    Cough  Had a fever one day last week and felt like she was getting a cold Got some better Cough is worse at night  Took whole bottle of robitussin  Had a little tussionex left over - out of that but it really helped Started with some yellow mucous - cannot spit up since her throat surgery Now if a dry cough worse at night occ mild headache  Perhaps low grade fever in afternoons  Few nights nosebleed and congestion (blows her nose hard)  Some chest soreness to cough  Little to no wheezing  Ears and throat are ok (no change in chronic throat pain)  Patient Active Problem List  Diagnoses  . MALIGNANT NEOPLASM OF TONSIL  . VITAMIN B12 DEFICIENCY  . UNSPECIFIED VITAMIN D DEFICIENCY  . HYPERLIPIDEMIA  . UNSPECIFIED ANEMIA  . ANXIETY  . GASTROPARESIS  . OSTEOPENIA  . EDEMA  . PELVIC  PAIN  . Routine general medical examination at a health care facility  . Hyperglycemia  . Depression  . Acute sinusitis  . Chronic neck pain   Past Medical History  Diagnosis Date  . Hyperlipidemia   . Tonsillar cancer   . Diverticulosis   . Shingles     Hx of  . Gastroparesis     related to previous radiation tx  . Anxiety   . Anemia     iron deficiency and B12 deficiency  . Cervical dysplasia     conization   Past Surgical History  Procedure Date  . Breast surgery     breast biopsy benign  . Appendectomy   . Tonsillectomy     cancer treated  with chemo and radiation  . Cholecystectomy   . Mole removal   . Esophagogastroduodenoscopy 07/2009    normal with few gastric polyps   History  Substance Use Topics  . Smoking status: Former Games developer  . Smokeless tobacco: Not on file  . Alcohol Use:    Family History  Problem Relation Age of Onset  . Osteoporosis Mother   . Hyperlipidemia Mother   . Depression Mother   . Cancer Mother     skin cancer ? basal cell and lung ca heavy smoker  . Alcohol abuse Father   . Cancer Father     skin CA ? basal cell  . Heart disease Father     CHF  . Hyperlipidemia Brother   . Hypertension Brother    Allergies  Allergen Reactions  . Amoxicillin     REACTION: rash  . Fluconazole     REACTION: hives   Current Outpatient Prescriptions on File Prior to Visit  Medication Sig Dispense Refill  . atorvastatin (LIPITOR) 10 MG tablet Take 10 mg by mouth every evening.        [name] b complex vitamins  tablet Take 1 tablet by mouth daily.        . cyanocobalamin (,VITAMIN B-12,) 1000 MCG/ML injection Inject 1,000 mcg into the muscle every 30 (thirty) days.        . ferrous sulfate 325 (65 FE) MG tablet Take 325 mg by mouth daily with breakfast.        . fluticasone (FLONASE) 50 MCG/ACT nasal spray 2 sprays by Nasal route at bedtime.        . metoCLOPramide (REGLAN) 10 MG tablet Take 10 mg by mouth 2 (two) times daily.       . Multiple Vitamins-Calcium (VIACTIV MULTI-VITAMIN PO) Take 2 capsules by mouth daily.       [name] omeprazole (PRILOSEC) 20 MG capsule Take 20 mg by mouth daily.        [name] rOPINIRole (REQUIP) 1 MG tablet take 2 tablets by mouth daily at bedtime  60 tablet  6  . ALPRAZolam (XANAX) 0.5 MG tablet Take 0.5 mg by mouth daily as needed.        . traMADol (ULTRAM) 50 MG tablet Take by mouth as needed.             Review of Systems Review of Systems  Constitutional: Negative for fever, appetite change,  and unexpected weight change. pos for fatigue Eyes: Negative for pain and visual  disturbance.  ENT pos for sinus pain/ congestion/ post nasal drip Respiratory: pos for cough- no wheeze or sob.   Cardiovascular: Negative.for cp or palpitations   Gastrointestinal: Negative for nausea, diarrhea and constipation.  Genitourinary: Negative for urgency and frequency.  Skin: Negative for pallor. or rash Neurological: Negative for weakness, light-headedness, numbness and headaches.  Hematological: Negative for adenopathy. Does not bruise/bleed easily.  Psychiatric/Behavioral: pos for depression and lack of motivation        Objective:   Physical Exam  Constitutional: She appears well-developed and well-nourished. No distress.       overwt and well appearing   HENT:  Head: Normocephalic and atraumatic.  Right Ear: External ear normal.  Left Ear: External ear normal.  Nose: Nose normal.  Mouth/Throat: Oropharynx is clear and moist.       Maxillary sinus tenderness bilaterally Worse on L   Eyes: Conjunctivae and EOM are normal. Pupils are equal, round, and reactive to light. Right eye exhibits no discharge. Left eye exhibits no discharge.  Neck: Normal range of motion. Neck supple. No JVD present. Carotid bruit is not present. Erythema present. No thyromegaly present.  Cardiovascular: Normal rate, regular rhythm, normal heart sounds and intact distal pulses.   Pulmonary/Chest: Effort normal and breath sounds normal. No respiratory distress. She has no wheezes. She has no rales.  Abdominal: Soft. Bowel sounds are normal.  Lymphadenopathy:    She has no cervical adenopathy.  Neurological: She is alert. She has normal reflexes. Coordination normal.  Skin: Skin is warm and dry. No rash noted. No erythema. No pallor.  Psychiatric:       Improved affect Pleasant and talkative  Good eye contact and comm skills            Assessment & Plan:

## 2010-07-27 NOTE — Assessment & Plan Note (Signed)
Ongoing since cancer tx to neck and throat  Helped by neurontin but that worsened depression Will work on getting depression under control and then re - try it  Pt will update with resp

## 2010-07-27 NOTE — Assessment & Plan Note (Signed)
Improved but not resolved with cessation of neurontin  Will try inc celexa from 20 to 40 after finishing her zpack Disc poss side eff Update if worse or not improving

## 2010-07-27 NOTE — Assessment & Plan Note (Signed)
Causing persistent cough with sinus pain Cover with zithromax Update if not improved  Will not inc celexa until she is done with this  Disc sympt care

## 2010-08-12 ENCOUNTER — Ambulatory Visit (INDEPENDENT_AMBULATORY_CARE_PROVIDER_SITE_OTHER): Payer: BC Managed Care – PPO | Admitting: Family Medicine

## 2010-08-12 DIAGNOSIS — E538 Deficiency of other specified B group vitamins: Secondary | ICD-10-CM

## 2010-08-12 MED ORDER — CYANOCOBALAMIN 1000 MCG/ML IJ SOLN
1000.0000 ug | Freq: Once | INTRAMUSCULAR | Status: AC
  Administered 2010-08-12: 1000 ug via INTRAMUSCULAR

## 2010-08-14 NOTE — Progress Notes (Signed)
B12 given

## 2010-08-26 ENCOUNTER — Other Ambulatory Visit: Payer: Self-pay | Admitting: Family Medicine

## 2010-09-18 ENCOUNTER — Ambulatory Visit: Payer: Self-pay | Admitting: Oncology

## 2010-09-27 LAB — CA 125: CA 125: 11 U/mL (ref 0.0–34.0)

## 2010-10-01 ENCOUNTER — Other Ambulatory Visit (INDEPENDENT_AMBULATORY_CARE_PROVIDER_SITE_OTHER): Payer: BC Managed Care – PPO | Admitting: Family Medicine

## 2010-10-01 DIAGNOSIS — R7309 Other abnormal glucose: Secondary | ICD-10-CM

## 2010-10-01 DIAGNOSIS — R739 Hyperglycemia, unspecified: Secondary | ICD-10-CM

## 2010-10-07 ENCOUNTER — Ambulatory Visit (INDEPENDENT_AMBULATORY_CARE_PROVIDER_SITE_OTHER): Payer: BC Managed Care – PPO | Admitting: Family Medicine

## 2010-10-07 ENCOUNTER — Encounter: Payer: Self-pay | Admitting: Family Medicine

## 2010-10-07 DIAGNOSIS — F329 Major depressive disorder, single episode, unspecified: Secondary | ICD-10-CM

## 2010-10-07 DIAGNOSIS — R7309 Other abnormal glucose: Secondary | ICD-10-CM

## 2010-10-07 DIAGNOSIS — R739 Hyperglycemia, unspecified: Secondary | ICD-10-CM

## 2010-10-07 DIAGNOSIS — F411 Generalized anxiety disorder: Secondary | ICD-10-CM

## 2010-10-07 DIAGNOSIS — E785 Hyperlipidemia, unspecified: Secondary | ICD-10-CM

## 2010-10-07 DIAGNOSIS — E538 Deficiency of other specified B group vitamins: Secondary | ICD-10-CM

## 2010-10-07 DIAGNOSIS — R109 Unspecified abdominal pain: Secondary | ICD-10-CM

## 2010-10-07 MED ORDER — ALPRAZOLAM 0.5 MG PO TABS: 0.5000 mg | ORAL_TABLET | Freq: Every day | ORAL | Status: DC | PRN

## 2010-10-07 MED ORDER — CYANOCOBALAMIN 1000 MCG/ML IJ SOLN
1000.0000 ug | Freq: Once | INTRAMUSCULAR | Status: AC
  Administered 2010-10-07: 1000 ug via INTRAMUSCULAR

## 2010-10-07 NOTE — Progress Notes (Signed)
Subjective:    Patient ID: MY RINKE, female    DOB: December 02, 1945, 65 y.o.   MRN: [id number]  HPI Here for f/u of hyperglycemia and anxiety  Last visit fasting sugar wsa 131- screening Disc in length need for wt loss and low glycemic diet   Is feeling pretty good overall  Is watching her sugar intake - candy was a major problem  Now eats 1 pc twice per week  Did also cut down sugar in tea 3/4 of a cup for a gallon  Eats too much bread - sandwhich for lunch   Tries to eat high fiber options as much as possible   Is walking for exercise 30-60 minutes almost every day at a brisk pace and also some Wii fit games   Now 3 mo out a1c is 6.3  No diabetes in the family   Wt is unchanged   Had 6 mo oncology check up -- and he had her see Dr Hyacinth Meeker (another female oncology doc) -- had CT scan showing a poss mass behind ovary -- vs fibroid-- they are watching that closely  Dr Harold Hedge dx it   Stopped neurontin - caused her too much depression   Xanax -- only took for panic attacks  Klonopin qd -- but was px bid prn  And then onoclogist decided her am nausea was actually caused by anxiety - px klonopin (? Mg) -- will call back with that  She gets anxious about eating because after her surgery- she chokes easily Also started panic attacks in menopause -- wakes up with panic attacks (used xanax for that and when she cannot swallow)  Then vomiting makes it worse  Never had trouble until menopause and cancer dx celexa 40 helps a lot   Patient Active Problem List  Diagnoses  . MALIGNANT NEOPLASM OF TONSIL  . VITAMIN B12 DEFICIENCY  . UNSPECIFIED VITAMIN D DEFICIENCY  . HYPERLIPIDEMIA  . UNSPECIFIED ANEMIA  . ANXIETY  . GASTROPARESIS  . OSTEOPENIA  . EDEMA  . PELVIC  PAIN  . Routine general medical examination at a health care facility  . Hyperglycemia  . Depression  . Chronic neck pain   Past Medical History  Diagnosis Date  . Hyperlipidemia   . Tonsillar cancer   .  Diverticulosis   . Shingles     Hx of  . Gastroparesis     related to previous radiation tx  . Anxiety   . Anemia     iron deficiency and B12 deficiency  . Cervical dysplasia     conization   Past Surgical History  Procedure Date  . Breast surgery     breast biopsy benign  . Appendectomy   . Tonsillectomy     cancer treated with chemo and radiation  . Cholecystectomy   . Mole removal   . Esophagogastroduodenoscopy 07/2009    normal with few gastric polyps   History  Substance Use Topics  . Smoking status: Former Games developer  . Smokeless tobacco: Not on file  . Alcohol Use:    Family History  Problem Relation Age of Onset  . Osteoporosis Mother   . Hyperlipidemia Mother   . Depression Mother   . Cancer Mother     skin cancer ? basal cell and lung ca heavy smoker  . Alcohol abuse Father   . Cancer Father     skin CA ? basal cell  . Heart disease Father     CHF  . Hyperlipidemia Brother   .  Hypertension Brother    Allergies  Allergen Reactions  . Amoxicillin     REACTION: rash  . Fluconazole     REACTION: hives  . Gabapentin     depression   Current Outpatient Prescriptions on File Prior to Visit  Medication Sig Dispense Refill  . atorvastatin (LIPITOR) 10 MG tablet take 1 tablet by mouth every evening  30 tablet  11  . b complex vitamins tablet Take 1 tablet by mouth daily.        . cyanocobalamin (,VITAMIN B-12,) 1000 MCG/ML injection Inject 1,000 mcg into the muscle every 30 (thirty) days.        . diphenhydrAMINE (BENADRYL) 25 MG tablet Take 25 mg by mouth at bedtime as needed.        . ferrous sulfate 325 (65 FE) MG tablet Take 325 mg by mouth daily with breakfast.        . fluticasone (FLONASE) 50 MCG/ACT nasal spray 2 sprays by Nasal route at bedtime.        . metoCLOPramide (REGLAN) 10 MG tablet Take 10 mg by mouth 2 (two) times daily.       . Multiple Vitamins-Calcium (VIACTIV MULTI-VITAMIN PO) Take 2 capsules by mouth daily.       [name] omeprazole  (PRILOSEC) 20 MG capsule take 1 capsule by mouth once daily  30 capsule  11  . rOPINIRole (REQUIP) 1 MG tablet take 2 tablets by mouth daily at bedtime  60 tablet  6  . chlorpheniramine-HYDROcodone (TUSSIONEX PENNKINETIC ER) 10-8 MG/5ML LQCR Take 5 mLs by mouth every 12 (twelve) hours as needed.  140 mL  0  . citalopram (CELEXA) 40 MG tablet Take 1 tablet (40 mg total) by mouth daily.  30 tablet  11  . traMADol (ULTRAM) 50 MG tablet Take by mouth as needed.            Review of Systems Review of Systems  Constitutional: Negative for fever, appetite change, fatigue and unexpected weight change.  Eyes: Negative for pain and visual disturbance.  Respiratory: Negative for cough and shortness of breath.   Cardiovascular: Negative.  For cp or palpitations  Gastrointestinal: Negative for nausea, diarrhea and constipation.  Genitourinary: Negative for urgency and frequency.  Skin: Negative for pallor. or rash  Neurological: Negative for weakness, light-headedness, numbness and headaches.  Hematological: Negative for adenopathy. Does not bruise/bleed easily.  Psychiatric/Behavioral: improved depression but anxiety persists         Objective:   Physical Exam  Constitutional: She appears well-developed and well-nourished. No distress.       overwt and well appearing   HENT:  Head: Normocephalic and atraumatic.  Mouth/Throat: Oropharynx is clear and moist.  Eyes: Conjunctivae and EOM are normal. Pupils are equal, round, and reactive to light.  Neck: Normal range of motion. Neck supple. No JVD present. Carotid bruit is not present. No thyromegaly present.       Baseline surgical changes of neck  Cardiovascular: Normal rate, regular rhythm, normal heart sounds and intact distal pulses.   Pulmonary/Chest: Breath sounds normal. No respiratory distress. She has no wheezes.  Abdominal: Soft. Bowel sounds are normal. She exhibits no distension and no mass. There is no tenderness.  Musculoskeletal:  She exhibits no edema and no tenderness.  Lymphadenopathy:    She has no cervical adenopathy.  Neurological: She is alert. She has normal reflexes. Coordination normal.  Skin: Skin is warm and dry. No rash noted. No erythema. No pallor.  Psychiatric: She  has a normal mood and affect.       Not overly anxious Not tearful  Nl eye contact and comm skills          Assessment & Plan:

## 2010-10-07 NOTE — Patient Instructions (Signed)
Stick with small portions of carbohydrates and the higher fiber the better  No sweets except for special occasions  No sugar drinks , including juices  Stick with exercise  Work on weight loss  Let us know how much klonopin (clonazepam) you are taking Use xanax with great caution -- because it is in the same class  Schedule fasting lab and then follow up in 6 months  B12 shot today

## 2010-10-07 NOTE — Assessment & Plan Note (Signed)
This began with menopause and her tonsillar cancer - causing am n/v and occ middle of the night panic attacks Will stick with celexa at 40 is helping Also xanax .5 for panic attacks that are unmanagable In addn her oncologist added ? Klonopin every am Pt is aware of potential sedation and habit with these drugs- but they do make her quality of life better She also knows not to drive with them

## 2010-10-07 NOTE — Assessment & Plan Note (Signed)
Shot today Doing well with that

## 2010-10-07 NOTE — Assessment & Plan Note (Signed)
Pt is now working with a female gyn/ oncologist Dr Hyacinth Meeker on eval of mass behind ovary- prev thought to be a fibroid  They are obs it for now Symptoms are not bad

## 2010-10-07 NOTE — Assessment & Plan Note (Signed)
Rev labs in detail Disc implications of borderline or pre diabetes  Need for wt loss and low glycemic diet disc in detail Plan made for no sweets/ water only beverages and smaller carb portions (complex also)  Lab and f/u 6 mo (will check chol too )

## 2010-10-07 NOTE — Assessment & Plan Note (Signed)
This is better off gabapentin - plans to stay off of it

## 2010-10-09 ENCOUNTER — Telehealth: Payer: Self-pay | Admitting: *Deleted

## 2010-10-09 NOTE — Telephone Encounter (Signed)
Pharmacist from [facility] called to verify that you are aware that pt is on klonopin, since you prescribed her xanax.  Per office note from 8/21 advised him that, yes, you are aware.

## 2010-10-09 NOTE — Telephone Encounter (Signed)
I am aware and we had a long conversation about it -thanks

## 2010-10-18 ENCOUNTER — Ambulatory Visit: Payer: Self-pay | Admitting: Oncology

## 2010-11-17 ENCOUNTER — Ambulatory Visit: Payer: Self-pay | Admitting: Oncology

## 2010-12-31 ENCOUNTER — Ambulatory Visit: Payer: Self-pay

## 2011-01-27 ENCOUNTER — Ambulatory Visit: Payer: Self-pay | Admitting: Gynecologic Oncology

## 2011-01-30 ENCOUNTER — Other Ambulatory Visit: Payer: Self-pay | Admitting: Family Medicine

## 2011-01-30 NOTE — Telephone Encounter (Signed)
OK to refill? Last OV 8/12

## 2011-01-30 NOTE — Telephone Encounter (Signed)
Will refill electronically  

## 2011-02-17 ENCOUNTER — Ambulatory Visit: Payer: Self-pay | Admitting: Oncology

## 2011-04-01 ENCOUNTER — Ambulatory Visit: Payer: Self-pay | Admitting: Oncology

## 2011-04-06 ENCOUNTER — Other Ambulatory Visit: Payer: BC Managed Care – PPO

## 2011-04-10 ENCOUNTER — Ambulatory Visit: Payer: BC Managed Care – PPO | Admitting: Family Medicine

## 2011-04-10 DIAGNOSIS — Z0289 Encounter for other administrative examinations: Secondary | ICD-10-CM

## 2011-04-17 ENCOUNTER — Ambulatory Visit: Payer: Self-pay | Admitting: Oncology

## 2011-05-18 ENCOUNTER — Ambulatory Visit: Payer: Self-pay | Admitting: Oncology

## 2011-07-31 ENCOUNTER — Other Ambulatory Visit (INDEPENDENT_AMBULATORY_CARE_PROVIDER_SITE_OTHER): Payer: BC Managed Care – PPO

## 2011-07-31 DIAGNOSIS — R739 Hyperglycemia, unspecified: Secondary | ICD-10-CM

## 2011-07-31 DIAGNOSIS — R7309 Other abnormal glucose: Secondary | ICD-10-CM

## 2011-07-31 DIAGNOSIS — E785 Hyperlipidemia, unspecified: Secondary | ICD-10-CM

## 2011-07-31 LAB — LIPID PANEL
HDL: 43.8 mg/dL (ref >39.00)
Total CHOL/HDL Ratio: 4

## 2011-07-31 LAB — AST: AST: 20 U/L (ref 0–37)

## 2011-07-31 LAB — ALT: ALT: 15 U/L (ref 0–35)

## 2011-07-31 LAB — HEMOGLOBIN A1C: Hgb A1c MFr Bld: 6.5 % (ref 4.6–6.5)

## 2011-08-02 ENCOUNTER — Other Ambulatory Visit: Payer: Self-pay | Admitting: Family Medicine

## 2011-08-03 NOTE — Telephone Encounter (Signed)
Will refill electronically She has appt with me in a few days

## 2011-08-03 NOTE — Telephone Encounter (Signed)
Cymbalta request [last refill 06.08.12 #[id number]--last OV 08.21.12]/SLS Please advise.

## 2011-08-05 ENCOUNTER — Ambulatory Visit (INDEPENDENT_AMBULATORY_CARE_PROVIDER_SITE_OTHER): Payer: BC Managed Care – PPO | Admitting: Family Medicine

## 2011-08-05 ENCOUNTER — Encounter: Payer: Self-pay | Admitting: Family Medicine

## 2011-08-05 VITALS — BP 102/62 | HR 69 | Temp 98.2°F | Ht 66.0 in | Wt 191.8 lb

## 2011-08-05 DIAGNOSIS — F411 Generalized anxiety disorder: Secondary | ICD-10-CM

## 2011-08-05 DIAGNOSIS — R739 Hyperglycemia, unspecified: Secondary | ICD-10-CM

## 2011-08-05 DIAGNOSIS — E785 Hyperlipidemia, unspecified: Secondary | ICD-10-CM

## 2011-08-05 DIAGNOSIS — F329 Major depressive disorder, single episode, unspecified: Secondary | ICD-10-CM

## 2011-08-05 DIAGNOSIS — R7309 Other abnormal glucose: Secondary | ICD-10-CM

## 2011-08-05 MED ORDER — ALPRAZOLAM 0.5 MG PO TABS: 0.5000 mg | ORAL_TABLET | Freq: Every day | ORAL | Status: DC | PRN

## 2011-08-05 MED ORDER — HYDROCOD POLST-CHLORPHEN POLST 10-8 MG/5ML PO LQCR: 5.0000 mL | Freq: Two times a day (BID) | ORAL | Status: DC | PRN

## 2011-08-05 MED ORDER — DULOXETINE HCL 30 MG PO CPEP: ORAL_CAPSULE | ORAL | Status: DC

## 2011-08-05 MED ORDER — ATORVASTATIN CALCIUM 10 MG PO TABS: 10.0000 mg | ORAL_TABLET | Freq: Every day | ORAL | Status: DC

## 2011-08-05 NOTE — Assessment & Plan Note (Signed)
Lab Results  Component Value Date   HGBA1C 6.5 07/31/2011   this is fairly stable with diet control Enc further work on diet/ exercise and wt watchers program for wt loss

## 2011-08-05 NOTE — Assessment & Plan Note (Signed)
See assessment for anxiety  Good coping tech  Will change to cymbalta

## 2011-08-05 NOTE — Assessment & Plan Note (Signed)
Stable on statin and diet Disc goals for lipids and reasons to control them Rev labs with pt Rev low sat fat diet in detail  

## 2011-08-05 NOTE — Progress Notes (Signed)
Subjective:    Patient ID: Suzanne Strong, female    DOB: Jan 24, 1946, 66 y.o.   MRN: [id number]  HPI Here for f/u of medicines  Still a lot of nausea  occ vomiting  Not sleeping well 2-3 hours per night  Some nights takes lunesta- not much help  Went off the xanax xr - did not work well for her  Still takes short acting xanax  Stays very anxious and jittery  Also has episodes of feeling hot and sweating   Interesting in changing celexa to something else   More anxiety/ less depression Is exercising   Going to wt watchers and cut out a lot of sugar   Stress? - routine has changed/ semi retired / husband is retired (that is good -more time together) No real triggers for her anxiety  Does occ get anx about her cancer- but has done well for 6 years (neck spasms at times)   Lab Results  Component Value Date   TSH 1.74 06/30/2010    Cholesterol continues to be well controlled with statin and diet  Diet is not always perfect , depending on what she can eat Lab Results  Component Value Date   CHOL 162 07/31/2011   HDL 43.80 07/31/2011   LDLCALC 92 07/31/2011   LDLDIRECT 114.0 06/30/2010   TRIG 132.0 07/31/2011   CHOLHDL 4 07/31/2011     Watching sugar/ hyperglycemia Good about low glycemic diet  Wt stable Lab Results  Component Value Date   HGBA1C 6.5 07/31/2011     Patient Active Problem List  Diagnosis  . MALIGNANT NEOPLASM OF TONSIL  . VITAMIN B12 DEFICIENCY  . UNSPECIFIED VITAMIN D DEFICIENCY  . HYPERLIPIDEMIA  . UNSPECIFIED ANEMIA  . ANXIETY  . GASTROPARESIS  . OSTEOPENIA  . EDEMA  . PELVIC  PAIN  . Routine general medical examination at a health care facility  . Hyperglycemia  . Depression  . Chronic neck pain   Past Medical History  Diagnosis Date  . Hyperlipidemia   . Tonsillar cancer   . Diverticulosis   . Shingles     Hx of  . Gastroparesis     related to previous radiation tx  . Anxiety   . Anemia     iron deficiency and B12 deficiency    . Cervical dysplasia     conization   Past Surgical History  Procedure Date  . Breast surgery     breast biopsy benign  . Appendectomy   . Tonsillectomy     cancer treated with chemo and radiation  . Cholecystectomy   . Mole removal   . Esophagogastroduodenoscopy 07/2009    normal with few gastric polyps   History  Substance Use Topics  . Smoking status: Former Games developer  . Smokeless tobacco: Not on file  . Alcohol Use:    Family History  Problem Relation Age of Onset  . Osteoporosis Mother   . Hyperlipidemia Mother   . Depression Mother   . Cancer Mother     skin cancer ? basal cell and lung ca heavy smoker  . Alcohol abuse Father   . Cancer Father     skin CA ? basal cell  . Heart disease Father     CHF  . Hyperlipidemia Brother   . Hypertension Brother    Allergies  Allergen Reactions  . Amoxicillin     REACTION: rash  . Fluconazole     REACTION: hives  . Gabapentin     depression  Current Outpatient Prescriptions on File Prior to Visit  Medication Sig Dispense Refill  . atorvastatin (LIPITOR) 10 MG tablet Take 1 tablet (10 mg total) by mouth daily.  30 tablet  11  . b complex vitamins tablet Take 1 tablet by mouth daily.        . cyanocobalamin (,VITAMIN B-12,) 1000 MCG/ML injection Inject 1,000 mcg into the muscle every 30 (thirty) days.        . diphenhydrAMINE (BENADRYL) 25 MG tablet Take 25 mg by mouth at bedtime as needed.        . ferrous sulfate 325 (65 FE) MG tablet Take 325 mg by mouth daily with breakfast.        . fluticasone (FLONASE) 50 MCG/ACT nasal spray 2 sprays by Nasal route at bedtime.        . Multiple Vitamins-Calcium (VIACTIV MULTI-VITAMIN PO) Take 2 capsules by mouth daily.       [name] omeprazole (PRILOSEC) 20 MG capsule take 1 capsule by mouth once daily  30 capsule  11  . rOPINIRole (REQUIP) 1 MG tablet take 2 tablets by mouth at bedtime  60 tablet  11  . DULoxetine (CYMBALTA) 30 MG capsule Take 1 pill by mouth once daily for 2 weeks  and then increase to 2 pills once daily if it is well tolerated  30 capsule  3  . metoCLOPramide (REGLAN) 10 MG tablet Take 10 mg by mouth 2 (two) times daily.              Review of Systems Review of Systems  Constitutional: Negative for fever, appetite change, fatigue and unexpected weight change.  Eyes: Negative for pain and visual disturbance.  Respiratory: Negative for cough and shortness of breath.   Cardiovascular: Negative for cp or palpitations    Gastrointestinal: Negative for nausea, diarrhea and constipation.  Genitourinary: Negative for urgency and frequency.  Skin: Negative for pallor or rash   Neurological: Negative for weakness, light-headedness, numbness and headaches.  Hematological: Negative for adenopathy. Does not bruise/bleed easily.  Psychiatric/Behavioral: pos for worsened anxiety , without too much depression, no SI        Objective:   Physical Exam  Constitutional: She appears well-developed and well-nourished. No distress.       overwt and well appearing   HENT:  Head: Normocephalic and atraumatic.  Right Ear: External ear normal.  Left Ear: External ear normal.  Mouth/Throat: Oropharynx is clear and moist.       Post operative changes in the neck noted   Eyes: Conjunctivae and EOM are normal. Pupils are equal, round, and reactive to light. No scleral icterus.  Neck: Normal range of motion. Neck supple. No JVD present. Carotid bruit is not present. No thyromegaly present.  Cardiovascular: Normal rate, regular rhythm, normal heart sounds and intact distal pulses.  Exam reveals no gallop.   Pulmonary/Chest: Effort normal and breath sounds normal. No respiratory distress. She has no wheezes.  Abdominal: Soft. Bowel sounds are normal. She exhibits no distension, no abdominal bruit and no mass. There is no tenderness.  Musculoskeletal: She exhibits no edema and no tenderness.  Lymphadenopathy:    She has no cervical adenopathy.  Neurological: She is  alert. She has normal reflexes. No cranial nerve deficit. Coordination normal.  Skin: Skin is warm and dry. No rash noted. No erythema. No pallor.  Psychiatric: Her speech is normal. Judgment normal. Her mood appears anxious. Her affect is labile. She is not agitated and not withdrawn. Cognition and  memory are normal. She expresses no homicidal and no suicidal ideation.       Pt is emotional today- tearful at times  Good communication skills and attentiveness          Assessment & Plan:

## 2011-08-05 NOTE — Assessment & Plan Note (Signed)
Anxiety not as well controlled Will change from celexa to cymbalta 30- then titrate to 60 as tol Will update if side eff Enc to keep up exercise Xanax as needed  F/u 4-6 wk Also checking tsh today

## 2011-08-05 NOTE — Patient Instructions (Addendum)
Keep working on [employer] and exercise Lab for thyroid today  Stop celexa Start cymbalta 30 mg - one daily - and in 2 weeks increase to 2 daily if doing ok  If side effects or feeling worse- stop it and let me know  Follow up with me in about 4-6 weeks

## 2011-08-06 ENCOUNTER — Encounter: Payer: Self-pay | Admitting: *Deleted

## 2011-08-12 ENCOUNTER — Ambulatory Visit: Payer: Self-pay

## 2011-08-28 ENCOUNTER — Other Ambulatory Visit: Payer: Self-pay | Admitting: Family Medicine

## 2011-08-28 NOTE — Telephone Encounter (Signed)
Will refill electronically  

## 2011-08-28 NOTE — Telephone Encounter (Signed)
Last OV was 08/02/11. Ok to refill?

## 2011-09-02 ENCOUNTER — Ambulatory Visit (INDEPENDENT_AMBULATORY_CARE_PROVIDER_SITE_OTHER): Payer: BC Managed Care – PPO | Admitting: Family Medicine

## 2011-09-02 ENCOUNTER — Encounter: Payer: Self-pay | Admitting: Family Medicine

## 2011-09-02 VITALS — BP 122/64 | HR 83 | Temp 98.1°F | Ht 66.0 in | Wt 199.8 lb

## 2011-09-02 DIAGNOSIS — F329 Major depressive disorder, single episode, unspecified: Secondary | ICD-10-CM

## 2011-09-02 MED ORDER — ALPRAZOLAM 0.5 MG PO TABS: 0.5000 mg | ORAL_TABLET | Freq: Every evening | ORAL | Status: DC | PRN

## 2011-09-02 MED ORDER — ROPINIROLE HCL 1 MG PO TABS: ORAL_TABLET | ORAL | Status: DC

## 2011-09-02 MED ORDER — DULOXETINE HCL 60 MG PO CPEP: 60.0000 mg | ORAL_CAPSULE | Freq: Every day | ORAL | Status: DC

## 2011-09-02 NOTE — Patient Instructions (Addendum)
Continue the cymbalta at 60 mg daily Use xanax as needed for sleep  I re wrote the requip  Keep active  Talk to supportive people  Follow up in about 3 months

## 2011-09-02 NOTE — Progress Notes (Signed)
Subjective:    Patient ID: Suzanne Strong, female    DOB: 12-19-1945, 66 y.o.   MRN: [id number]  HPI Here for depression and anxiety  Is doing better Has not needed to take any more xanax  Is now on cymbalta (better with am dosing ) - is up to 60 mg  No side effects except it did increase her appetie a little - has to watch that Wt is up 8-9 lb  No longer feels jittery or anxious inside  Motivation is good , taking more classes at the gym-- took a zumba class  Is able to get joy out of things    Still having a hard time with sleep occasionally  Some nights just cannot settle down to sleep -then really tired the next day   Takes requip -- 1 in day and 2 at night --needs the px adjusted   Patient Active Problem List  Diagnosis  . MALIGNANT NEOPLASM OF TONSIL  . VITAMIN B12 DEFICIENCY  . UNSPECIFIED VITAMIN D DEFICIENCY  . HYPERLIPIDEMIA  . UNSPECIFIED ANEMIA  . ANXIETY  . GASTROPARESIS  . OSTEOPENIA  . EDEMA  . PELVIC  PAIN  . Routine general medical examination at a health care facility  . Hyperglycemia  . Depression  . Chronic neck pain   Past Medical History  Diagnosis Date  . Hyperlipidemia   . Tonsillar cancer   . Diverticulosis   . Shingles     Hx of  . Gastroparesis     related to previous radiation tx  . Anxiety   . Anemia     iron deficiency and B12 deficiency  . Cervical dysplasia     conization   Past Surgical History  Procedure Date  . Breast surgery     breast biopsy benign  . Appendectomy   . Tonsillectomy     cancer treated with chemo and radiation  . Cholecystectomy   . Mole removal   . Esophagogastroduodenoscopy 07/2009    normal with few gastric polyps   History  Substance Use Topics  . Smoking status: Former Games developer  . Smokeless tobacco: Not on file  . Alcohol Use: No   Family History  Problem Relation Age of Onset  . Osteoporosis Mother   . Hyperlipidemia Mother   . Depression Mother   . Cancer Mother     skin cancer ?  basal cell and lung ca heavy smoker  . Alcohol abuse Father   . Cancer Father     skin CA ? basal cell  . Heart disease Father     CHF  . Hyperlipidemia Brother   . Hypertension Brother    Allergies  Allergen Reactions  . Amoxicillin     REACTION: rash  . Fluconazole     REACTION: hives  . Gabapentin     depression   Current Outpatient Prescriptions on File Prior to Visit  Medication Sig Dispense Refill  . ALPRAZolam (XANAX) 0.5 MG tablet Take 1 tablet (0.5 mg total) by mouth daily as needed for anxiety (for panic attack).  30 tablet  0  . atorvastatin (LIPITOR) 10 MG tablet Take 1 tablet (10 mg total) by mouth daily.  30 tablet  11  . b complex vitamins tablet Take 1 tablet by mouth daily.        . chlorpheniramine-HYDROcodone (TUSSIONEX PENNKINETIC ER) 10-8 MG/5ML LQCR Take 5 mLs by mouth every 12 (twelve) hours as needed.  140 mL  0  . cyanocobalamin (,VITAMIN B-12,) 1000  MCG/ML injection Inject 1,000 mcg into the muscle every 30 (thirty) days.        . diphenhydrAMINE (BENADRYL) 25 MG tablet Take 25 mg by mouth at bedtime as needed.        . DULoxetine (CYMBALTA) 30 MG capsule Take 1 pill by mouth once daily for 2 weeks and then increase to 2 pills once daily if it is well tolerated  30 capsule  3  . ferrous sulfate 325 (65 FE) MG tablet Take 325 mg by mouth daily with breakfast.        . fluticasone (FLONASE) 50 MCG/ACT nasal spray 2 sprays by Nasal route at bedtime.        . Multiple Vitamins-Calcium (VIACTIV MULTI-VITAMIN PO) Take 2 capsules by mouth daily.       [name] omeprazole (PRILOSEC) 20 MG capsule take 1 capsule by mouth once daily  30 capsule  11  . rOPINIRole (REQUIP) 1 MG tablet take 2 tablets by mouth at bedtime  60 tablet  11  . metoCLOPramide (REGLAN) 10 MG tablet Take 10 mg by mouth 2 (two) times daily.            Review of Systems    Review of Systems  Constitutional: Negative for fever, appetite change, fatigue and unexpected weight change.  Eyes: Negative  for pain and visual disturbance.  Respiratory: Negative for cough and shortness of breath.   Cardiovascular: Negative for cp or palpitations    Gastrointestinal: Negative for nausea, diarrhea and constipation.  Genitourinary: Negative for urgency and frequency.  Skin: Negative for pallor or rash   Neurological: Negative for weakness, light-headedness, numbness and headaches. pos for restless legs - some in day and night Hematological: Negative for adenopathy. Does not bruise/bleed easily.  Psychiatric/Behavioral: pos for depression and anxiety that are improved     Objective:   Physical Exam  Constitutional: She appears well-developed and well-nourished. No distress.       overwt and well appearing   HENT:  Head: Normocephalic and atraumatic.  Mouth/Throat: Oropharynx is clear and moist.  Eyes: Conjunctivae and EOM are normal. Pupils are equal, round, and reactive to light. No scleral icterus.  Neck: Normal range of motion. Neck supple. No JVD present. No thyromegaly present.  Cardiovascular: Normal rate and regular rhythm.   Pulmonary/Chest: Effort normal and breath sounds normal.  Musculoskeletal: She exhibits no edema and no tenderness.  Lymphadenopathy:    She has no cervical adenopathy.  Neurological: She is alert. She has normal reflexes. She displays no tremor. No cranial nerve deficit. She exhibits normal muscle tone. Coordination normal.  Skin: Skin is warm and dry. No rash noted. No erythema. No pallor.  Psychiatric: Her speech is normal and behavior is normal. Judgment and thought content normal. Her mood appears not anxious. Her affect is not blunt and not labile. Cognition and memory are normal. Cognition and memory are not impaired. She does not exhibit a depressed mood.       Pleasant and talkative          Assessment & Plan:

## 2011-09-02 NOTE — Assessment & Plan Note (Signed)
Depression and anx are much improved with cymbalta  Also good health habits and coping techniques  Still trouble sleeping - will try the xanax on nights she needs it  Has good support  Will update if any side eff or problems F/u 3 mo

## 2011-10-20 ENCOUNTER — Encounter: Payer: Self-pay | Admitting: Family Medicine

## 2011-10-20 ENCOUNTER — Telehealth: Payer: Self-pay

## 2011-10-20 ENCOUNTER — Ambulatory Visit (INDEPENDENT_AMBULATORY_CARE_PROVIDER_SITE_OTHER): Payer: 59 | Admitting: Family Medicine

## 2011-10-20 VITALS — BP 120/68 | HR 76 | Temp 98.5°F | Ht 66.0 in | Wt 202.8 lb

## 2011-10-20 DIAGNOSIS — B86 Scabies: Secondary | ICD-10-CM

## 2011-10-20 MED ORDER — PERMETHRIN 5 % EX CREA: TOPICAL_CREAM | Freq: Once | CUTANEOUS | Status: AC

## 2011-10-20 NOTE — Patient Instructions (Addendum)
Use the cream as directed.  Recently worn clothing and bedsheets should be washed at 140F or higher (?60C) and dried the day after the first treatment to decrease the chance of recurrence.  Retreat in 1 week if needed.  Take benadryl in meantime if needed for itching.

## 2011-10-20 NOTE — Progress Notes (Signed)
She thought she had razor burn on her legs.  The irritated area was burning and then itching.  Bilateral, from the mid shin distally, circumferential but not on the feet.  Sx started ~1 week ago.  No new meds.  No new travel, soaps, detergents.  "worst itching in my life"  Worse at night.   Meds, vitals, and allergies reviewed.   ROS: See HPI.  Otherwise, noncontributory.  nad ncat B lower legs with linear distribution of very small blanching red papules, some excoriated.

## 2011-10-20 NOTE — Telephone Encounter (Signed)
10/17/11 legs began to itch; pt has not changes soap, laundry detergent etc. Pt now has rash on both legs with severe itching and sweating on and off; pt not having any trouble breathing or SOB. Pt request appt today; pt to see Dr Para March today at 3:15 pm. Pt will call back if condition changes or worsens.

## 2011-10-21 DIAGNOSIS — B86 Scabies: Secondary | ICD-10-CM | POA: Insufficient documentation

## 2011-10-21 NOTE — Assessment & Plan Note (Signed)
Likely scabies.  Start topical tx and d/w pt about linens, clothing.  She agrees.

## 2011-10-28 ENCOUNTER — Ambulatory Visit: Payer: Self-pay | Admitting: Oncology

## 2011-10-28 LAB — CBC CANCER CENTER
Basophil %: 0.5 %
Eosinophil %: 2.9 %
HCT: 36.6 % (ref 35.0–47.0)
HGB: 11.8 g/dL — ABNORMAL LOW (ref 12.0–16.0)
Lymphocyte %: 22.1 %
MCH: 28.5 pg (ref 26.0–34.0)
MCV: 88 fL (ref 80–100)
Monocyte %: 5.8 %
Neutrophil #: 3.9 x10 3/mm (ref 1.4–6.5)
WBC: 5.7 x10 3/mm (ref 3.6–11.0)

## 2011-10-28 LAB — COMPREHENSIVE METABOLIC PANEL
Albumin: 3.3 g/dL — ABNORMAL LOW (ref 3.4–5.0)
Alkaline Phosphatase: 96 U/L (ref 50–136)
Calcium, Total: 8.8 mg/dL (ref 8.5–10.1)
Co2: 31 mmol/L (ref 21–32)
EGFR (African American): >60
EGFR (Non-African Amer.): 59 — ABNORMAL LOW
Glucose: 176 mg/dL — ABNORMAL HIGH (ref 65–99)
SGOT(AST): 23 U/L (ref 15–37)

## 2011-10-28 LAB — TSH: Thyroid Stimulating Horm: 3.11 u[IU]/mL

## 2011-11-02 ENCOUNTER — Telehealth: Payer: Self-pay | Admitting: Family Medicine

## 2011-11-02 ENCOUNTER — Emergency Department: Payer: Self-pay | Admitting: Emergency Medicine

## 2011-11-02 LAB — CBC
HCT: 37.7 % (ref 35.0–47.0)
HGB: 11.9 g/dL — ABNORMAL LOW (ref 12.0–16.0)
MCH: 27.3 pg (ref 26.0–34.0)
MCHC: 31.5 g/dL — ABNORMAL LOW (ref 32.0–36.0)
MCV: 87 fL (ref 80–100)
RDW: 13.9 % (ref 11.5–14.5)

## 2011-11-02 LAB — URINALYSIS, COMPLETE
Bacteria: NONE SEEN
Bilirubin,UR: NEGATIVE
Blood: NEGATIVE
Glucose,UR: 50 mg/dL (ref 0–75)
Nitrite: NEGATIVE
Specific Gravity: 1.025 (ref 1.003–1.030)

## 2011-11-02 LAB — COMPREHENSIVE METABOLIC PANEL
Albumin: 3.2 g/dL — ABNORMAL LOW (ref 3.4–5.0)
Alkaline Phosphatase: 101 U/L (ref 50–136)
BUN: 15 mg/dL (ref 7–18)
Bilirubin,Total: 0.3 mg/dL (ref 0.2–1.0)
EGFR (African American): >60
Glucose: 175 mg/dL — ABNORMAL HIGH (ref 65–99)
SGOT(AST): 17 U/L (ref 15–37)
SGPT (ALT): 24 U/L (ref 12–78)
Total Protein: 7.8 g/dL (ref 6.4–8.2)

## 2011-11-02 LAB — LIPASE, BLOOD: Lipase: 102 U/L (ref 73–393)

## 2011-11-02 NOTE — Telephone Encounter (Signed)
Agree with above 

## 2011-11-02 NOTE — Telephone Encounter (Signed)
Caller: Chrisette/Patient; Patient Name: Suzanne Strong; PCP: Roxy Manns [facility]); Best Callback Phone Number: [phone]; Reason for call: Other. Caller reports has nausea, headache, eye pain and fevers of at least 100.  Thermometer cuts off at 100. Tylenol does not seem to be reducing fever. Onset of symptoms this am. Hydrated, taking fluids and she has voided this afternoon. Per Fever-Adult Protocol, Recurrence of fever of 100 or greater in frail elderly after more than 24 hours continuous hours without a fever, See MD in 4 hours. No open appointments at this late hour. Caller advised to have husband take her to UC and is agreeable.

## 2011-11-17 ENCOUNTER — Ambulatory Visit: Payer: Self-pay | Admitting: Oncology

## 2011-12-02 ENCOUNTER — Encounter: Payer: Self-pay | Admitting: Family Medicine

## 2011-12-02 ENCOUNTER — Ambulatory Visit (INDEPENDENT_AMBULATORY_CARE_PROVIDER_SITE_OTHER): Payer: 59 | Admitting: Family Medicine

## 2011-12-02 VITALS — BP 138/64 | HR 86 | Temp 98.4°F | Ht 66.0 in | Wt 203.8 lb

## 2011-12-02 DIAGNOSIS — F411 Generalized anxiety disorder: Secondary | ICD-10-CM

## 2011-12-02 DIAGNOSIS — F329 Major depressive disorder, single episode, unspecified: Secondary | ICD-10-CM

## 2011-12-02 DIAGNOSIS — K5900 Constipation, unspecified: Secondary | ICD-10-CM | POA: Insufficient documentation

## 2011-12-02 NOTE — Patient Instructions (Addendum)
For constipation try miralax once daily -then as needed  Align is a good probiotic to try otc for 2-4 weeks  We will do ref to psychiatry at check out  Follow up with me in 6 months for annual exam with labs prior

## 2011-12-02 NOTE — Progress Notes (Signed)
Subjective:    Patient ID: Suzanne Strong, female    DOB: Aug 10, 1945, 66 y.o.   MRN: [id number]  HPI Here for f/u of chronic conditions  Last Monday - was constipated   (had taken some rolaids) Tried dulcolax for women-no help, and drank a bottle of something from pharmacy (like a prep) Then given enema - that worked about 4 hours later Ever since then -has to take laxative daily - large stool/ hard and hurts  A fair amt of cramping  Got some metamucil  Senna - takes 1-2 per day   She also had mild pneumonia several wks before- took a zpak  Is eating yogurt daily    Anxiety/ depression cymbalta- that helps (also helps her neuropathy) Xanax- helps some  Still has trouble sleeping  Is keyed up  She has good sleep hygeine Is afraid of ambien   Lab Results  Component Value Date   TSH 1.66 08/05/2011     She is frustrated about her weight- really bothers her a lot  Followed weight watchers - and she stil gained weight with that   zumba and walking  Avoids sugar and bread     Hyperlipidemia On lipitor and diet  Lab Results  Component Value Date   CHOL 162 07/31/2011   CHOL 188 06/30/2010   CHOL 162 08/26/2009   Lab Results  Component Value Date   HDL 43.80 07/31/2011   HDL 40.98 06/30/2010   HDL 11.91 08/26/2009   Lab Results  Component Value Date   LDLCALC 92 07/31/2011   LDLCALC 93 08/26/2009   LDLCALC 105* 07/19/2008   Lab Results  Component Value Date   TRIG 132.0 07/31/2011   TRIG 223.0* 06/30/2010   TRIG 110.0 08/26/2009   Lab Results  Component Value Date   CHOLHDL 4 07/31/2011   CHOLHDL 4 06/30/2010   CHOLHDL 3 08/26/2009   Lab Results  Component Value Date   LDLDIRECT 114.0 06/30/2010    On iron for anemia Lab Results  Component Value Date   WBC 5.5 06/30/2010   HGB 13.1 06/30/2010   HCT 38.3 06/30/2010   MCV 90.8 06/30/2010   PLT 252.0 06/30/2010   Lab Results  Component Value Date   VITAMINB12 387 06/30/2010    Patient Active Problem List    Diagnosis  . MALIGNANT NEOPLASM OF TONSIL  . VITAMIN B12 DEFICIENCY  . UNSPECIFIED VITAMIN D DEFICIENCY  . HYPERLIPIDEMIA  . UNSPECIFIED ANEMIA  . ANXIETY  . GASTROPARESIS  . OSTEOPENIA  . EDEMA  . PELVIC  PAIN  . Routine general medical examination at a health care facility  . Hyperglycemia  . Depression  . Chronic neck pain  . Scabies  . Constipation   Past Medical History  Diagnosis Date  . Hyperlipidemia   . Tonsillar cancer   . Diverticulosis   . Shingles     Hx of  . Gastroparesis     related to previous radiation tx  . Anxiety   . Anemia     iron deficiency and B12 deficiency  . Cervical dysplasia     conization   Past Surgical History  Procedure Date  . Breast surgery     breast biopsy benign  . Appendectomy   . Tonsillectomy     cancer treated with chemo and radiation  . Cholecystectomy   . Mole removal   . Esophagogastroduodenoscopy 07/2009    normal with few gastric polyps   History  Substance Use Topics  .  Smoking status: Former Games developer  . Smokeless tobacco: Never Used  . Alcohol Use: No   Family History  Problem Relation Age of Onset  . Osteoporosis Mother   . Hyperlipidemia Mother   . Depression Mother   . Cancer Mother     skin cancer ? basal cell and lung ca heavy smoker  . Alcohol abuse Father   . Cancer Father     skin CA ? basal cell  . Heart disease Father     CHF  . Hyperlipidemia Brother   . Hypertension Brother    Allergies  Allergen Reactions  . Amoxicillin     REACTION: rash  . Fluconazole     REACTION: hives  . Gabapentin     depression   Current Outpatient Prescriptions on File Prior to Visit  Medication Sig Dispense Refill  . ALPRAZolam (XANAX) 0.5 MG tablet Take 1 tablet (0.5 mg total) by mouth at bedtime as needed for sleep.  30 tablet  3  . atorvastatin (LIPITOR) 10 MG tablet Take 1 tablet (10 mg total) by mouth daily.  30 tablet  11  . b complex vitamins tablet Take 1 tablet by mouth daily.        .  chlorpheniramine-HYDROcodone (TUSSIONEX PENNKINETIC ER) 10-8 MG/5ML LQCR Take 5 mLs by mouth every 12 (twelve) hours as needed.  140 mL  0  . cyanocobalamin (,VITAMIN B-12,) 1000 MCG/ML injection Inject 1,000 mcg into the muscle every 30 (thirty) days.        . diphenhydrAMINE (BENADRYL) 25 MG tablet Take 25 mg by mouth at bedtime as needed.        . DULoxetine (CYMBALTA) 60 MG capsule Take 1 capsule (60 mg total) by mouth daily.  30 capsule  11  . ferrous sulfate 325 (65 FE) MG tablet Take 325 mg by mouth daily with breakfast.        . fluticasone (FLONASE) 50 MCG/ACT nasal spray 2 sprays by Nasal route at bedtime.        . metoCLOPramide (REGLAN) 10 MG tablet Take 10 mg by mouth 2 (two) times daily.       . Multiple Vitamins-Calcium (VIACTIV MULTI-VITAMIN PO) Take 2 capsules by mouth daily.       [name] omeprazole (PRILOSEC) 20 MG capsule take 1 capsule by mouth once daily  30 capsule  11  . rOPINIRole (REQUIP) 1 MG tablet Take 1 by mouth in day time and 2 by mouth at bedtime as needed for restless legs  90 tablet  11  . zolpidem (AMBIEN CR) 6.25 MG CR tablet Take 6.25 mg by mouth at bedtime as needed.         Review of Systems Review of Systems  Constitutional: Negative for fever, appetite change, fatigue and unexpected weight change.  Eyes: Negative for pain and visual disturbance.  Respiratory: Negative for cough and shortness of breath.   Cardiovascular: Negative for cp or palpitations    Gastrointestinal: Negative for nausea, diarrhea and pos for constipation Genitourinary: Negative for urgency and frequency.  Skin: Negative for pallor or rash   Neurological: Negative for weakness, light-headedness, numbness and headaches.  Hematological: Negative for adenopathy. Does not bruise/bleed easily.  Psychiatric/Behavioral: Negative for dysphoric mood. The patient has symptoms of anxiety with trouble sleeping        Objective:   Physical Exam  Constitutional: She appears well-developed and  well-nourished. No distress.  HENT:  Head: Normocephalic and atraumatic.  Mouth/Throat: Oropharynx is clear and moist.  Eyes:  Conjunctivae normal and EOM are normal. Pupils are equal, round, and reactive to light. Right eye exhibits no discharge. Left eye exhibits no discharge. No scleral icterus.  Neck: Normal range of motion. Neck supple. No JVD present. Carotid bruit is not present. No thyromegaly present.  Cardiovascular: Normal rate and regular rhythm.   Pulmonary/Chest: Effort normal and breath sounds normal.  Abdominal: Soft. Bowel sounds are normal. She exhibits no distension, no abdominal bruit and no mass. There is no rebound and no guarding.  Musculoskeletal: She exhibits no edema.  Lymphadenopathy:    She has no cervical adenopathy.  Neurological: She is alert. She has normal reflexes.  Skin: Skin is warm and dry. No rash noted. No erythema. No pallor.  Psychiatric: She has a normal mood and affect.          Assessment & Plan:

## 2011-12-02 NOTE — Assessment & Plan Note (Signed)
This has worsened along with sleep issues despite several medications and counseling Ref to psychiatry for further eval and help with this Good sleep hygiene and exercise habits

## 2011-12-02 NOTE — Assessment & Plan Note (Signed)
Worse lately with hx of gastroparesis I recommend miralax qd to effect , and also align otc for 1-2 weeks Update if no imp

## 2011-12-15 ENCOUNTER — Telehealth: Payer: Self-pay | Admitting: Family Medicine

## 2011-12-15 NOTE — Telephone Encounter (Signed)
Patient calling, has bilateral pedal edema.  Same started on Saturday 10/23 and the swelling has not gone completely away at night.  Denies any shortness of breath.    Triaged per Edema, Atraumatic.  Needs to be seen in 24 hours.  Scheduled for 0830 Wednesday am.

## 2011-12-15 NOTE — Telephone Encounter (Signed)
Will see her then 

## 2011-12-16 ENCOUNTER — Encounter: Payer: Self-pay | Admitting: Family Medicine

## 2011-12-16 ENCOUNTER — Ambulatory Visit (INDEPENDENT_AMBULATORY_CARE_PROVIDER_SITE_OTHER): Payer: 59 | Admitting: Family Medicine

## 2011-12-16 VITALS — BP 136/72 | HR 80 | Temp 98.5°F | Ht 66.0 in | Wt 205.8 lb

## 2011-12-16 DIAGNOSIS — R609 Edema, unspecified: Secondary | ICD-10-CM

## 2011-12-16 LAB — COMPREHENSIVE METABOLIC PANEL
BUN: 15 mg/dL (ref 6–23)
CO2: 29 mEq/L (ref 19–32)
Creatinine, Ser: 0.8 mg/dL (ref 0.4–1.2)
GFR: 73.55 mL/min (ref >60.00)
Glucose, Bld: 130 mg/dL — ABNORMAL HIGH (ref 70–99)
Total Bilirubin: 0.3 mg/dL (ref 0.3–1.2)

## 2011-12-16 LAB — TSH: TSH: 2.49 u[IU]/mL (ref 0.35–5.50)

## 2011-12-16 MED ORDER — HYDROCOD POLST-CHLORPHEN POLST 10-8 MG/5ML PO LQCR
5.0000 mL | Freq: Two times a day (BID) | ORAL | Status: DC | PRN
Start: 2011-12-16 — End: 2012-06-01

## 2011-12-16 NOTE — Assessment & Plan Note (Signed)
Both ankles for several months/ recurrent  Suspect multi factorial  Disc ways to prevent edema No s/s/ of chf at all Lab today  If nl pt is interested in diuretic

## 2011-12-16 NOTE — Patient Instructions (Addendum)
Labs today for fluid retention/ swelling  Will update you with result If all normal will consider a diuretic Elevate feet when sitting Minimize sodium

## 2011-12-16 NOTE — Progress Notes (Signed)
Subjective:    Patient ID: Suzanne Strong, female    DOB: 11/29/1945, 66 y.o.   MRN: [id number]  HPI  Here for edema Her ankles and feet are swelling  Was working at [employer] on her feet all day Sunday  Not painful  Shoes are tight  ? Retaining fluid Does watch sodium intake   Does take tramadol - but not in 2-3 weeks   No PND or orthopnea  No sob on exertion or CP  Has never had heart failure   She stands a lot at work  Is up and down at home  Elevation does help She worse support hose when younger   Wt is up 2 lb with bmi of 33  Patient Active Problem List  Diagnosis  . MALIGNANT NEOPLASM OF TONSIL  . VITAMIN B12 DEFICIENCY  . UNSPECIFIED VITAMIN D DEFICIENCY  . HYPERLIPIDEMIA  . UNSPECIFIED ANEMIA  . ANXIETY  . GASTROPARESIS  . OSTEOPENIA  . EDEMA  . PELVIC  PAIN  . Routine general medical examination at a health care facility  . Hyperglycemia  . Depression  . Chronic neck pain  . Scabies  . Constipation   Past Medical History  Diagnosis Date  . Hyperlipidemia   . Tonsillar cancer   . Diverticulosis   . Shingles     Hx of  . Gastroparesis     related to previous radiation tx  . Anxiety   . Anemia     iron deficiency and B12 deficiency  . Cervical dysplasia     conization   Past Surgical History  Procedure Date  . Breast surgery     breast biopsy benign  . Appendectomy   . Tonsillectomy     cancer treated with chemo and radiation  . Cholecystectomy   . Mole removal   . Esophagogastroduodenoscopy 07/2009    normal with few gastric polyps   History  Substance Use Topics  . Smoking status: Former Games developer  . Smokeless tobacco: Never Used  . Alcohol Use: No   Family History  Problem Relation Age of Onset  . Osteoporosis Mother   . Hyperlipidemia Mother   . Depression Mother   . Cancer Mother     skin cancer ? basal cell and lung ca heavy smoker  . Alcohol abuse Father   . Cancer Father     skin CA ? basal cell  . Heart disease  Father     CHF  . Hyperlipidemia Brother   . Hypertension Brother    Allergies  Allergen Reactions  . Amoxicillin     REACTION: rash  . Fluconazole     REACTION: hives  . Gabapentin     depression   Current Outpatient Prescriptions on File Prior to Visit  Medication Sig Dispense Refill  . ALPRAZolam (XANAX) 0.5 MG tablet Take 1 tablet (0.5 mg total) by mouth at bedtime as needed for sleep.  30 tablet  3  . atorvastatin (LIPITOR) 10 MG tablet Take 1 tablet (10 mg total) by mouth daily.  30 tablet  11  . b complex vitamins tablet Take 1 tablet by mouth daily.        . chlorpheniramine-HYDROcodone (TUSSIONEX PENNKINETIC ER) 10-8 MG/5ML LQCR Take 5 mLs by mouth every 12 (twelve) hours as needed.  140 mL  0  . cyanocobalamin (,VITAMIN B-12,) 1000 MCG/ML injection Inject 1,000 mcg into the muscle every 30 (thirty) days.        . diphenhydrAMINE (BENADRYL) 25 MG  tablet Take 25 mg by mouth at bedtime as needed.        . ferrous sulfate 325 (65 FE) MG tablet Take 325 mg by mouth daily with breakfast.        . fluticasone (FLONASE) 50 MCG/ACT nasal spray 2 sprays by Nasal route at bedtime.        . Multiple Vitamins-Calcium (VIACTIV MULTI-VITAMIN PO) Take 2 capsules by mouth daily.       [name] omeprazole (PRILOSEC) 20 MG capsule take 1 capsule by mouth once daily  30 capsule  11  . rOPINIRole (REQUIP) 1 MG tablet Take 1 by mouth in day time and 2 by mouth at bedtime as needed for restless legs  90 tablet  11  . traMADol (ULTRAM) 50 MG tablet Take 1 tablet by mouth as needed.         Review of Systems Review of Systems  Constitutional: Negative for fever, appetite change, fatigue and unexpected weight change.  Eyes: Negative for pain and visual disturbance.  Respiratory: Negative for cough and shortness of breath  pos for varicose veins Cardiovascular: Negative for cp or palpitations    Gastrointestinal: Negative for nausea, diarrhea and constipation.  Genitourinary: Negative for urgency and  frequency.  Skin: Negative for pallor or rash   MSK pos for edema in ankle Neurological: Negative for weakness, light-headedness, numbness and headaches.  Hematological: Negative for adenopathy. Does not bruise/bleed easily.  Psychiatric/Behavioral: Negative for dysphoric mood. The patient is not nervous/anxious.         Objective:   Physical Exam  Constitutional: She appears well-developed and well-nourished. No distress.  HENT:  Head: Normocephalic and atraumatic.  Mouth/Throat: Oropharynx is clear and moist.  Eyes: Conjunctivae normal and EOM are normal. Pupils are equal, round, and reactive to light. No scleral icterus.  Neck: Normal range of motion. Neck supple. No JVD present. Carotid bruit is not present. No thyromegaly present.  Cardiovascular: Normal rate, regular rhythm, normal heart sounds and intact distal pulses.  Exam reveals no gallop.   Pulmonary/Chest: Effort normal and breath sounds normal. No respiratory distress. She has no wheezes.  Abdominal: Soft. Bowel sounds are normal. She exhibits no abdominal bruit.       No inguinal adenopathy  Musculoskeletal: She exhibits edema. She exhibits no tenderness.       Trace to one plus ankle edema Some varicosities No palp cords or tenderness   Lymphadenopathy:    She has no cervical adenopathy.  Neurological: She is alert. She has normal reflexes. No cranial nerve deficit. She exhibits normal muscle tone. Coordination normal.  Skin: Skin is warm and dry. No rash noted. No erythema. No pallor.  Psychiatric: She has a normal mood and affect.          Assessment & Plan:

## 2012-01-13 ENCOUNTER — Encounter: Payer: Self-pay | Admitting: *Deleted

## 2012-01-13 ENCOUNTER — Ambulatory Visit (INDEPENDENT_AMBULATORY_CARE_PROVIDER_SITE_OTHER): Payer: 59 | Admitting: Family Medicine

## 2012-01-13 ENCOUNTER — Encounter: Payer: Self-pay | Admitting: Family Medicine

## 2012-01-13 VITALS — BP 118/64 | HR 88 | Temp 98.2°F | Ht 66.0 in | Wt 209.2 lb

## 2012-01-13 DIAGNOSIS — R609 Edema, unspecified: Secondary | ICD-10-CM

## 2012-01-13 LAB — BASIC METABOLIC PANEL
Chloride: 101 mEq/L (ref 96–112)
GFR: 77.85 mL/min (ref >60.00)
Potassium: 4 mEq/L (ref 3.5–5.1)
Sodium: 136 mEq/L (ref 135–145)

## 2012-01-13 NOTE — Progress Notes (Signed)
Subjective:    Patient ID: Suzanne Strong, female    DOB: [birth year], 66 y.o.   MRN: [id number]  HPI Here for f/u of edema  Last visit disc pedal edema - esp after standing with no cardiac red flags Labs were ok   Chemistry      Component Value Date/Time   NA 139 12/16/2011 0859   K 4.3 12/16/2011 0859   CL 103 12/16/2011 0859   CO2 29 12/16/2011 0859   BUN 15 12/16/2011 0859   CREATININE 0.8 12/16/2011 0859      Component Value Date/Time   CALCIUM 8.9 12/16/2011 0859   ALKPHOS 71 12/16/2011 0859   AST 34 12/16/2011 0859   ALT 31 12/16/2011 0859   BILITOT 0.3 12/16/2011 0859      Lab Results  Component Value Date   TSH 2.49 12/16/2011    She was started on spironolactone 25 mg daily  Swelling is improved -only takes pill once a week - only when swelling , and is using less sodium - glad about that   Patient Active Problem List  Diagnosis  . MALIGNANT NEOPLASM OF TONSIL  . VITAMIN B12 DEFICIENCY  . UNSPECIFIED VITAMIN D DEFICIENCY  . HYPERLIPIDEMIA  . UNSPECIFIED ANEMIA  . ANXIETY  . GASTROPARESIS  . OSTEOPENIA  . EDEMA  . PELVIC  PAIN  . Routine general medical examination at a health care facility  . Hyperglycemia  . Depression  . Chronic neck pain  . Scabies  . Constipation   Past Medical History  Diagnosis Date  . Hyperlipidemia   . Tonsillar cancer   . Diverticulosis   . Shingles     Hx of  . Gastroparesis     related to previous radiation tx  . Anxiety   . Anemia     iron deficiency and B12 deficiency  . Cervical dysplasia     conization   Past Surgical History  Procedure Date  . Breast surgery     breast biopsy benign  . Appendectomy   . Tonsillectomy     cancer treated with chemo and radiation  . Cholecystectomy   . Mole removal   . Esophagogastroduodenoscopy 07/2009    normal with few gastric polyps   History  Substance Use Topics  . Smoking status: Former Games developer  . Smokeless tobacco: Never Used  . Alcohol Use: No    Family History  Problem Relation Age of Onset  . Osteoporosis Mother   . Hyperlipidemia Mother   . Depression Mother   . Cancer Mother     skin cancer ? basal cell and lung ca heavy smoker  . Alcohol abuse Father   . Cancer Father     skin CA ? basal cell  . Heart disease Father     CHF  . Hyperlipidemia Brother   . Hypertension Brother    Allergies  Allergen Reactions  . Amoxicillin     REACTION: rash  . Fluconazole     REACTION: hives  . Gabapentin     depression   Current Outpatient Prescriptions on File Prior to Visit  Medication Sig Dispense Refill  . ALPRAZolam (XANAX) 0.5 MG tablet Take 1 tablet (0.5 mg total) by mouth at bedtime as needed for sleep.  30 tablet  3  . atorvastatin (LIPITOR) 10 MG tablet Take 1 tablet (10 mg total) by mouth daily.  30 tablet  11  . b complex vitamins tablet Take 1 tablet by mouth daily.        [name]  chlorpheniramine-HYDROcodone (TUSSIONEX PENNKINETIC ER) 10-8 MG/5ML LQCR Take 5 mLs by mouth every 12 (twelve) hours as needed.  140 mL  0  . cyanocobalamin (,VITAMIN B-12,) 1000 MCG/ML injection Inject 1,000 mcg into the muscle every 30 (thirty) days.        . ferrous sulfate 325 (65 FE) MG tablet Take 325 mg by mouth daily with breakfast.        . fluticasone (FLONASE) 50 MCG/ACT nasal spray 2 sprays by Nasal route at bedtime.        [name] GARCINIA CAMBOGIA-CHROMIUM PO Take 1 tablet by mouth 2 (two) times daily.      Chilton Si Coffee Bean-Yerba Mate (GREEN COFFEE BEAN EXTRACT PO) Take 1 tablet by mouth 2 (two) times daily.      . mirtazapine (REMERON) 15 MG tablet Take 15 mg by mouth at bedtime.      . Multiple Vitamins-Calcium (VIACTIV MULTI-VITAMIN PO) Take 2 capsules by mouth daily.       . NON FORMULARY 1 tablet daily. [place]issus      . Nutritional Supplements (ESTROVEN ENERGY) TABS Take 1 tablet by mouth daily.      [name] omeprazole (PRILOSEC) 20 MG capsule take 1 capsule by mouth once daily  30 capsule  11  . Polyethylene Glycol 3350 (MIRALAX PO)  Take by mouth as needed.      . Probiotic Product (ALIGN PO) Take 1 tablet by mouth daily.      [name] rOPINIRole (REQUIP) 1 MG tablet Take 1 by mouth in day time and 2 by mouth at bedtime as needed for restless legs  90 tablet  11  . sertraline (ZOLOFT) 50 MG tablet Take 50 mg by mouth daily.      [name] spironolactone (ALDACTONE) 25 MG tablet as needed.      . traMADol (ULTRAM) 50 MG tablet Take 1 tablet by mouth as needed.            Review of Systems    Review of Systems  Constitutional: Negative for fever, appetite change, fatigue and unexpected weight change.  Eyes: Negative for pain and visual disturbance.  Respiratory: Negative for cough and shortness of breath.   Cardiovascular: Negative for cp or palpitations   neg for PND or orthopnea  Gastrointestinal: Negative for nausea, diarrhea and constipation.  Genitourinary: Negative for urgency and frequency.  Skin: Negative for pallor or rash   Neurological: Negative for weakness, light-headedness, numbness and headaches.  Hematological: Negative for adenopathy. Does not bruise/bleed easily.  Psychiatric/Behavioral: Negative for dysphoric mood. The patient is not nervous/anxious.      Objective:   Physical Exam  Constitutional: She appears well-developed and well-nourished. No distress.  HENT:  Head: Normocephalic and atraumatic.  Mouth/Throat: Oropharynx is clear and moist.  Eyes: Conjunctivae normal and EOM are normal. Pupils are equal, round, and reactive to light. No scleral icterus.  Neck: Normal range of motion. Neck supple. No JVD present. Carotid bruit is not present. No thyromegaly present.  Cardiovascular: Normal rate, regular rhythm and intact distal pulses.  Exam reveals no gallop.   Pulmonary/Chest: Effort normal and breath sounds normal. No respiratory distress. She has no wheezes.  Abdominal: She exhibits no abdominal bruit.  Musculoskeletal: She exhibits no edema.  Lymphadenopathy:    She has no cervical adenopathy.   Neurological: She is alert. She has normal reflexes.  Skin: Skin is warm and dry. No rash noted.  Psychiatric: She has a normal mood and affect.  Assessment & Plan:

## 2012-01-13 NOTE — Patient Instructions (Addendum)
You are due for a tetanus shot - ask your oncologist about that  I'm glad your swelling is improved Labs today  Update me if any changes Take the spironolactone as needed

## 2012-01-13 NOTE — Assessment & Plan Note (Signed)
Improved with spironolactone which she takes as needed -avg once per week  Lab today No side eff or problem Disc imp of avoiding sodium/ elevating feet and walking for exercise

## 2012-02-15 ENCOUNTER — Other Ambulatory Visit: Payer: Self-pay | Admitting: Family Medicine

## 2012-02-24 ENCOUNTER — Ambulatory Visit: Payer: Self-pay | Admitting: Oncology

## 2012-02-24 LAB — COMPREHENSIVE METABOLIC PANEL
Albumin: 3.4 g/dL (ref 3.4–5.0)
Alkaline Phosphatase: 137 U/L — ABNORMAL HIGH (ref 50–136)
Anion Gap: 6 — ABNORMAL LOW (ref 7–16)
BUN: 14 mg/dL (ref 7–18)
Bilirubin,Total: 0.2 mg/dL (ref 0.2–1.0)
Calcium, Total: 8.6 mg/dL (ref 8.5–10.1)
Chloride: 105 mmol/L (ref 98–107)
Co2: 28 mmol/L (ref 21–32)
Creatinine: 0.88 mg/dL (ref 0.60–1.30)
EGFR (Non-African Amer.): >60
Osmolality: 281 (ref 275–301)
Potassium: 4.3 mmol/L (ref 3.5–5.1)
SGOT(AST): 25 U/L (ref 15–37)
SGPT (ALT): 28 U/L (ref 12–78)
Sodium: 139 mmol/L (ref 136–145)

## 2012-02-24 LAB — CBC CANCER CENTER
Basophil %: 0.8 %
HGB: 12.3 g/dL (ref 12.0–16.0)
Lymphocyte #: 1.3 x10 3/mm (ref 1.0–3.6)
MCHC: 32.1 g/dL (ref 32.0–36.0)
Monocyte #: 0.5 x10 3/mm (ref 0.2–0.9)
Neutrophil #: 5.5 x10 3/mm (ref 1.4–6.5)
Neutrophil %: 72.7 %
Platelet: 306 x10 3/mm (ref 150–440)
RDW: 14.4 % (ref 11.5–14.5)

## 2012-03-19 ENCOUNTER — Ambulatory Visit: Payer: Self-pay | Admitting: Oncology

## 2012-05-23 ENCOUNTER — Telehealth: Payer: Self-pay | Admitting: Family Medicine

## 2012-05-23 DIAGNOSIS — R739 Hyperglycemia, unspecified: Secondary | ICD-10-CM

## 2012-05-23 DIAGNOSIS — Z0000 Encounter for general adult medical examination without abnormal findings: Secondary | ICD-10-CM

## 2012-05-23 DIAGNOSIS — E538 Deficiency of other specified B group vitamins: Secondary | ICD-10-CM

## 2012-05-23 DIAGNOSIS — E559 Vitamin D deficiency, unspecified: Secondary | ICD-10-CM

## 2012-05-23 DIAGNOSIS — M899 Disorder of bone, unspecified: Secondary | ICD-10-CM

## 2012-05-23 DIAGNOSIS — E785 Hyperlipidemia, unspecified: Secondary | ICD-10-CM

## 2012-05-23 NOTE — Telephone Encounter (Signed)
Message copied by Judy Pimple on Mon May 23, 2012  8:34 AM ------      Message from: Alvina Chou      Created: Thu May 19, 2012  8:56 AM      Regarding: Lab orders for Wednesday 4.9.14       Patient is scheduled for CPX labs, please order future labs, Thanks , Terri       ------

## 2012-05-25 ENCOUNTER — Other Ambulatory Visit (INDEPENDENT_AMBULATORY_CARE_PROVIDER_SITE_OTHER): Payer: 59

## 2012-05-25 DIAGNOSIS — R7309 Other abnormal glucose: Secondary | ICD-10-CM

## 2012-05-25 DIAGNOSIS — E559 Vitamin D deficiency, unspecified: Secondary | ICD-10-CM

## 2012-05-25 DIAGNOSIS — R739 Hyperglycemia, unspecified: Secondary | ICD-10-CM

## 2012-05-25 DIAGNOSIS — E785 Hyperlipidemia, unspecified: Secondary | ICD-10-CM

## 2012-05-25 DIAGNOSIS — E538 Deficiency of other specified B group vitamins: Secondary | ICD-10-CM

## 2012-05-25 DIAGNOSIS — Z0000 Encounter for general adult medical examination without abnormal findings: Secondary | ICD-10-CM

## 2012-05-25 DIAGNOSIS — M949 Disorder of cartilage, unspecified: Secondary | ICD-10-CM

## 2012-05-25 LAB — COMPREHENSIVE METABOLIC PANEL
ALT: 19 U/L (ref 0–35)
AST: 24 U/L (ref 0–37)
CO2: 28 mEq/L (ref 19–32)
Chloride: 103 mEq/L (ref 96–112)
GFR: 87.96 mL/min (ref >60.00)
Sodium: 140 mEq/L (ref 135–145)
Total Bilirubin: 0.4 mg/dL (ref 0.3–1.2)
Total Protein: 7.5 g/dL (ref 6.0–8.3)

## 2012-05-25 LAB — CBC WITH DIFFERENTIAL/PLATELET
Basophils Absolute: 0 10*3/uL (ref 0.0–0.1)
Eosinophils Absolute: 0.2 10*3/uL (ref 0.0–0.7)
Lymphocytes Relative: 22.8 % (ref 12.0–46.0)
Lymphs Abs: 1 10*3/uL (ref 0.7–4.0)
MCHC: 32.5 g/dL (ref 30.0–36.0)
Monocytes Relative: 6.4 % (ref 3.0–12.0)
Platelets: 319 10*3/uL (ref 150.0–400.0)
RDW: 15.4 % — ABNORMAL HIGH (ref 11.5–14.6)
WBC: 4.5 10*3/uL (ref 4.5–10.5)

## 2012-05-25 LAB — TSH: TSH: 3.79 u[IU]/mL (ref 0.35–5.50)

## 2012-05-25 LAB — LIPID PANEL
HDL: 38.3 mg/dL — ABNORMAL LOW (ref >39.00)
Total CHOL/HDL Ratio: 5
VLDL: 58 mg/dL — ABNORMAL HIGH (ref 0.0–40.0)

## 2012-05-26 LAB — VITAMIN D 25 HYDROXY (VIT D DEFICIENCY, FRACTURES): Vit D, 25-Hydroxy: 39 ng/mL (ref 30–89)

## 2012-06-01 ENCOUNTER — Encounter: Payer: Self-pay | Admitting: Family Medicine

## 2012-06-01 ENCOUNTER — Ambulatory Visit (INDEPENDENT_AMBULATORY_CARE_PROVIDER_SITE_OTHER): Payer: 59 | Admitting: Family Medicine

## 2012-06-01 VITALS — BP 110/62 | HR 82 | Temp 98.5°F | Ht 65.75 in | Wt 217.5 lb

## 2012-06-01 DIAGNOSIS — M949 Disorder of cartilage, unspecified: Secondary | ICD-10-CM

## 2012-06-01 DIAGNOSIS — Z0000 Encounter for general adult medical examination without abnormal findings: Secondary | ICD-10-CM

## 2012-06-01 DIAGNOSIS — E1165 Type 2 diabetes mellitus with hyperglycemia: Secondary | ICD-10-CM

## 2012-06-01 DIAGNOSIS — Z23 Encounter for immunization: Secondary | ICD-10-CM

## 2012-06-01 DIAGNOSIS — E785 Hyperlipidemia, unspecified: Secondary | ICD-10-CM

## 2012-06-01 MED ORDER — METFORMIN HCL 500 MG PO TABS: 500.0000 mg | ORAL_TABLET | Freq: Two times a day (BID) | ORAL | Status: DC

## 2012-06-01 MED ORDER — HYDROCOD POLST-CHLORPHEN POLST 10-8 MG/5ML PO LQCR: 5.0000 mL | Freq: Two times a day (BID) | ORAL | Status: DC | PRN

## 2012-06-01 NOTE — Progress Notes (Signed)
Subjective:    Patient ID: Suzanne Strong, female    DOB: Jul 16, 1945, 67 y.o.   MRN: [id number]  HPI Here for health maintenance exam and to review chronic medical problems   Has been doing fairly well overall   Wt is up 8 lb with bmi of 35 She is upset about that  Is drinking smoothies with fruit and vegetables and protein  Not enough exercise  Is on remeron - that may have caused problems   Zoster status -cannot have the vaccine Td 5/03- due for that today  Flu vaccine- did not get this year Pap 7/12 -she has a gyn , time for her to f/u  mammo 10/13-not due yet Self exam-no lumps  colonosc 9/11  dexa 07 - osteopenia in forearm D level is 39- no fractures , will check with gyn about getting a f/u  Hx of B12 def Level 289  Hx of anemia  Lab Results  Component Value Date   WBC 4.5 05/25/2012   HGB 11.8* 05/25/2012   HCT 36.4 05/25/2012   MCV 80.6 05/25/2012   PLT 319.0 05/25/2012   she is taking her iron  A bit fatigued at times   Hyperglycemia has turned into DM Lab Results  Component Value Date   HGBA1C 8.7* 05/25/2012   not on medicines and not checking sugar  Is thirsty and urinates a lot  No numbness  Too much starch in diet , and she does drink tea with sugar   Hyperlipidemia Lab Results  Component Value Date   CHOL 190 05/25/2012   CHOL 162 07/31/2011   CHOL 188 06/30/2010   Lab Results  Component Value Date   HDL 38.30* 05/25/2012   HDL 78.29 07/31/2011   HDL 56.21 06/30/2010   Lab Results  Component Value Date   LDLCALC 92 07/31/2011   LDLCALC 93 08/26/2009   LDLCALC 105* 07/19/2008   Lab Results  Component Value Date   TRIG 290.0* 05/25/2012   TRIG 132.0 07/31/2011   TRIG 223.0* 06/30/2010   Lab Results  Component Value Date   CHOLHDL 5 05/25/2012   CHOLHDL 4 07/31/2011   CHOLHDL 4 06/30/2010   Lab Results  Component Value Date   LDLDIRECT 116.1 05/25/2012   LDLDIRECT 114.0 06/30/2010   on lipitor       Chemistry      Component Value Date/Time   NA  140 05/25/2012 0814   K 4.0 05/25/2012 0814   CL 103 05/25/2012 0814   CO2 28 05/25/2012 0814   BUN 12 05/25/2012 0814   CREATININE 0.7 05/25/2012 0814      Component Value Date/Time   CALCIUM 9.1 05/25/2012 0814   ALKPHOS 100 05/25/2012 0814   AST 24 05/25/2012 0814   ALT 19 05/25/2012 0814   BILITOT 0.4 05/25/2012 0814      Review of Systems Review of Systems  Constitutional: Negative for fever, appetite change, fatigue and unexpected weight change.  Eyes: Negative for pain and visual disturbance.  Respiratory: Negative for cough and shortness of breath.   Cardiovascular: Negative for cp or palpitations    Gastrointestinal: Negative for nausea, diarrhea and constipation.  Genitourinary: Negative for urgency and pos for frequency. pos for inc thirst  Skin: Negative for pallor or rash   Neurological: Negative for weakness, light-headedness, numbness and headaches.  Hematological: Negative for adenopathy. Does not bruise/bleed easily.  Psychiatric/Behavioral: Negative for dysphoric mood. The patient is not nervous/anxious.  Objective:   Physical Exam  Constitutional: She appears well-developed and well-nourished. No distress.  obese and well appearing   HENT:  Head: Normocephalic and atraumatic.  Right Ear: External ear normal.  Left Ear: External ear normal.  Nose: Nose normal.  Mouth/Throat: Oropharynx is clear and moist.  Eyes: Conjunctivae and EOM are normal. Pupils are equal, round, and reactive to light. Right eye exhibits no discharge. Left eye exhibits no discharge. No scleral icterus.  Neck: Normal range of motion. Neck supple. No JVD present. Carotid bruit is not present. No thyromegaly present.  Baseline scarring  Cardiovascular: Normal rate, regular rhythm, normal heart sounds and intact distal pulses.  Exam reveals no gallop.   Pulmonary/Chest: Effort normal and breath sounds normal. No respiratory distress. She has no wheezes. She has no rales.  Abdominal: Soft. Bowel  sounds are normal. She exhibits no distension, no abdominal bruit and no mass. There is no tenderness.  Musculoskeletal: She exhibits no edema and no tenderness.  Lymphadenopathy:    She has no cervical adenopathy.  Neurological: She is alert. She has normal reflexes. No cranial nerve deficit. She exhibits normal muscle tone. Coordination normal.  Skin: Skin is warm and dry. No rash noted. No erythema. No pallor.  Psychiatric: She has a normal mood and affect.          Assessment & Plan:

## 2012-06-01 NOTE — Assessment & Plan Note (Signed)
Pt sees gyn and will disc dexa planning Last dexa 07 with openia of forearm  On ca and D D level is tx

## 2012-06-01 NOTE — Assessment & Plan Note (Signed)
New with A1c of 8.7- suprisingly  Disc plan to begin tx - disc diet/exercise and need for wt loss in detail Ref to DM teaching  Start metformin 500 bid and f/u 3 mo  Will learn to start checking glucose readings

## 2012-06-01 NOTE — Patient Instructions (Addendum)
Tetanus shot today  Get vitamin B12 1000 mcg and take 1 pill daily  Work towards 5 days of exercise per week for 30 or more minutes Cut sugar and fat in diet  Avoid red meat/ fried foods/ egg yolks/ fatty breakfast meats/ butter, cheese and high fat dairy/ and shellfish   We will refer you for diabetic education at check out  Start metformin 500 mg twice daily with meals  Go for your gyn appt  Follow up with me in about 3 months

## 2012-06-01 NOTE — Assessment & Plan Note (Signed)
Reviewed health habits including diet and exercise and skin cancer prevention Also reviewed health mt list, fam hx and immunizations  Wellness labs reviewed Sees gyn  Tdap today

## 2012-06-01 NOTE — Assessment & Plan Note (Signed)
Up a bit with lipitor and diet Disc goals for lipids and reasons to control them Rev labs with pt Rev low sat fat diet in detail  F/u 3 mo

## 2012-06-27 ENCOUNTER — Encounter: Payer: Self-pay | Admitting: Family Medicine

## 2012-06-27 ENCOUNTER — Ambulatory Visit (INDEPENDENT_AMBULATORY_CARE_PROVIDER_SITE_OTHER): Payer: 59 | Admitting: Family Medicine

## 2012-06-27 ENCOUNTER — Telehealth: Payer: Self-pay | Admitting: Family Medicine

## 2012-06-27 VITALS — BP 128/70 | HR 72 | Temp 98.7°F | Ht 65.75 in | Wt 219.5 lb

## 2012-06-27 DIAGNOSIS — E1165 Type 2 diabetes mellitus with hyperglycemia: Secondary | ICD-10-CM

## 2012-06-27 LAB — GLUCOSE, POCT (MANUAL RESULT ENTRY): POC Glucose: 208 mg/dl — AB (ref 70–99)

## 2012-06-27 NOTE — Progress Notes (Signed)
Subjective:    Patient ID: Suzanne Strong, female    DOB: Jul 01, 1945, 67 y.o.   MRN: [id number]  HPI Will be starting her DM class at [facility] on Thursday   Checked sugar at work when she felt clammy  - was in low 200s  She has tried to eat better   Has generally not felt well =the last few weeks  Is generally lethargic -does not know why  ? If from getting used to medicine - and getting used to eating more frequently  She does have more loose stools - does not want to stop it for that reason   No focal symptoms at all  No urinary symptoms   Eating vegetables and lean meat and fish  No longer puts sugar in any beverages  Is walking more now - 30 minutes a day  On metformin 500 bid   Wt is up 2 lb with bmi of 35  Patient Active Problem List   Diagnosis Date Noted  . Constipation 12/02/2011  . Chronic neck pain 07/25/2010  . Diabetes type 2, uncontrolled 07/07/2010  . Depression 07/07/2010  . Routine general medical examination at a health care facility 06/26/2010  . VITAMIN B12 DEFICIENCY 09/04/2009  . UNSPECIFIED ANEMIA 08/29/2009  . ANXIETY 08/26/2009  . GASTROPARESIS 08/26/2009  . UNSPECIFIED VITAMIN D DEFICIENCY 07/19/2008  . OSTEOPENIA 07/19/2007  . MALIGNANT NEOPLASM OF TONSIL 07/18/2007  . HYPERLIPIDEMIA 07/18/2007  . EDEMA 07/18/2007   Past Medical History  Diagnosis Date  . Hyperlipidemia   . Tonsillar cancer   . Diverticulosis   . Shingles     Hx of  . Gastroparesis     related to previous radiation tx  . Anxiety   . Anemia     iron deficiency and B12 deficiency  . Cervical dysplasia     conization   Past Surgical History  Procedure Laterality Date  . Breast surgery      breast biopsy benign  . Appendectomy    . Tonsillectomy      cancer treated with chemo and radiation  . Cholecystectomy    . Mole removal    . Esophagogastroduodenoscopy  07/2009    normal with few gastric polyps   History  Substance Use Topics  . Smoking status:  Former Games developer  . Smokeless tobacco: Never Used  . Alcohol Use: No   Family History  Problem Relation Age of Onset  . Osteoporosis Mother   . Hyperlipidemia Mother   . Depression Mother   . Cancer Mother     skin cancer ? basal cell and lung ca heavy smoker  . Alcohol abuse Father   . Cancer Father     skin CA ? basal cell  . Heart disease Father     CHF  . Hyperlipidemia Brother   . Hypertension Brother    Allergies  Allergen Reactions  . Amoxicillin     REACTION: rash  . Fluconazole     REACTION: hives  . Gabapentin     depression   Current Outpatient Prescriptions on File Prior to Visit  Medication Sig Dispense Refill  . ALPRAZolam (XANAX) 0.5 MG tablet Take 1 tablet (0.5 mg total) by mouth at bedtime as needed for sleep.  30 tablet  3  . atorvastatin (LIPITOR) 10 MG tablet Take 1 tablet (10 mg total) by mouth daily.  30 tablet  11  . b complex vitamins tablet Take 1 tablet by mouth daily.        [name]  cyanocobalamin (,VITAMIN B-12,) 1000 MCG/ML injection Inject 1,000 mcg into the muscle every 30 (thirty) days.        . ferrous sulfate 325 (65 FE) MG tablet Take 325 mg by mouth daily with breakfast.        . fluticasone (FLONASE) 50 MCG/ACT nasal spray 2 sprays by Nasal route at bedtime.        . metFORMIN (GLUCOPHAGE) 500 MG tablet Take 1 tablet (500 mg total) by mouth 2 (two) times daily with a meal.  60 tablet  3  . mirtazapine (REMERON) 15 MG tablet Take 15 mg by mouth at bedtime.      [name] omeprazole (PRILOSEC) 20 MG capsule take 1 capsule by mouth once daily  30 capsule  11  . Polyethylene Glycol 3350 (MIRALAX PO) Take by mouth as needed.      . Probiotic Product (ALIGN PO) Take 1 tablet by mouth daily.      [name] rOPINIRole (REQUIP) 1 MG tablet Take 1 by mouth in day time and 2 by mouth at bedtime as needed for restless legs  90 tablet  11  . sertraline (ZOLOFT) 50 MG tablet Take 50 mg by mouth daily.      [name] spironolactone (ALDACTONE) 25 MG tablet take 1 tablet by mouth once  daily  30 tablet  3  . traMADol (ULTRAM) 50 MG tablet Take 1 tablet by mouth as needed.      . chlorpheniramine-HYDROcodone (TUSSIONEX PENNKINETIC ER) 10-8 MG/5ML LQCR Take 5 mLs by mouth every 12 (twelve) hours as needed.  140 mL  0   No current facility-administered medications on file prior to visit.      Review of Systems Review of Systems  Constitutional: Negative for fever, appetite change, and unexpected weight change. pos for fatigue and malaise Eyes: Negative for pain and visual disturbance.  Respiratory: Negative for cough and shortness of breath.   Cardiovascular: Negative for cp or palpitations    Gastrointestinal: Negative for nausea, and constipation. pos for loose stool, neg for blood in stool Genitourinary: Negative for urgency and frequency. neg for excessive thirst  Skin: Negative for pallor or rash   Neurological: Negative for weakness, light-headedness, numbness and headaches.  Hematological: Negative for adenopathy. Does not bruise/bleed easily.  Psychiatric/Behavioral: Negative for dysphoric mood. The patient is not nervous/anxious.         Objective:   Physical Exam  Constitutional: She appears well-developed and well-nourished. No distress.  obese and well appearing   HENT:  Head: Normocephalic and atraumatic.  Mouth/Throat: Oropharynx is clear and moist.  Eyes: Conjunctivae and EOM are normal. Pupils are equal, round, and reactive to light. Right eye exhibits no discharge. Left eye exhibits no discharge.  Neck: Normal range of motion. Neck supple. No JVD present. Carotid bruit is not present.  Cardiovascular: Normal rate and regular rhythm.   Pulmonary/Chest: Effort normal and breath sounds normal. No respiratory distress. She has no wheezes.  Abdominal: She exhibits no abdominal bruit.  Musculoskeletal: She exhibits no edema.  Lymphadenopathy:    She has no cervical adenopathy.  Neurological: She has normal reflexes.  Skin: Skin is warm and dry. No  rash noted. No erythema. No pallor.  Psychiatric: She has a normal mood and affect.          Assessment & Plan:

## 2012-06-27 NOTE — Assessment & Plan Note (Addendum)
Pt has had several sugars in 200s - rev sympt of low sugar  (glucose reading 208 today more than 2 hours after eating) Given px for DM supplies for first class on Thursday for DM ed Is doing well with diet and exercise so far  Need to watch out for diarrhea with the metformin

## 2012-06-27 NOTE — Patient Instructions (Signed)
Keep up the good work with diet and exercise so far  Start the diabetic classes as planned  Continue metformin - if diarrhea gets worse we will re evaluate  Take px for your supplies to your first class

## 2012-06-27 NOTE — Telephone Encounter (Signed)
Will see her then 

## 2012-06-27 NOTE — Telephone Encounter (Signed)
Patient Information:  Caller Name: Henrine  Phone: [phone]  Patient: Suzanne Strong, Suzanne Strong  Gender: Female  DOB: 1945/03/09  Age: 67 Years  PCP: Roxy Manns [facility])  Office Follow Up:  Does the office need to follow up with this patient?: No  Instructions For The Office: N/A  RN Note:  BS 213;  states she feels tired but no dizziness, no rapid breathing, no increased urination.  Per diabetes/high blood sugar protocol, advised appt today; appt scheduled 06/27/12 1530 with Dr. Milinda Antis.  krs/can  Symptoms  Reason For Call & Symptoms: Seen 4 weeks ago; blood sugar was high.  Was assigned to take a class 06/30/12.  BS was 213 AM 06/27/12 when she complained to employer that she felt ill.  Does not have monitoriing equipment.  Reviewed Health History In EMR: Yes  Reviewed Medications In EMR: Yes  Reviewed Allergies In EMR: Yes  Reviewed Surgeries / Procedures: Yes  Date of Onset of Symptoms: Unknown  Guideline(s) Used:  Diabetes - High Blood Sugar  Disposition Per Guideline:   See Today in Office  Reason For Disposition Reached:   Symptoms of high blood sugar (e.g., frequent urination, weak, weight loss) and not able to test blood glucose  Advice Given:  N/A  Patient Will Follow Care Advice:  YES  Appointment Scheduled:  06/27/2012 15:30:00 Appointment Scheduled Provider:  Roxy Manns [facility])

## 2012-07-08 ENCOUNTER — Other Ambulatory Visit: Payer: Self-pay | Admitting: *Deleted

## 2012-07-08 MED ORDER — ONETOUCH DELICA LANCETS FINE MISC: 1.0000 | Status: DC

## 2012-07-08 MED ORDER — GLUCOSE BLOOD VI STRP: ORAL_STRIP | Status: DC

## 2012-07-22 ENCOUNTER — Other Ambulatory Visit: Payer: Self-pay | Admitting: Family Medicine

## 2012-08-11 ENCOUNTER — Telehealth: Payer: Self-pay

## 2012-08-11 NOTE — Telephone Encounter (Signed)
Pt wanted to ck on status of one touch test strips at [facility]; spoke with [facility]hem[facility]le rx on hold waiting on pt to let them know she needed refill. Advised to fill. Pt will go by pharmacy.

## 2012-08-16 ENCOUNTER — Ambulatory Visit: Payer: Self-pay | Admitting: Oncology

## 2012-08-20 ENCOUNTER — Other Ambulatory Visit: Payer: Self-pay | Admitting: Family Medicine

## 2012-08-30 ENCOUNTER — Encounter: Payer: Self-pay | Admitting: Radiology

## 2012-08-30 LAB — CBC CANCER CENTER
Basophil %: 0.6 %
Eosinophil #: 0.2 x10 3/mm (ref 0.0–0.7)
Eosinophil %: 2.7 %
HGB: 11.7 g/dL — ABNORMAL LOW (ref 12.0–16.0)
Lymphocyte #: 1 x10 3/mm (ref 1.0–3.6)
MCH: 26.4 pg (ref 26.0–34.0)
MCV: 81 fL (ref 80–100)
Monocyte #: 0.4 x10 3/mm (ref 0.2–0.9)
Monocyte %: 6.4 %
RDW: 16.1 % — ABNORMAL HIGH (ref 11.5–14.5)
WBC: 6.2 x10 3/mm (ref 3.6–11.0)

## 2012-08-30 LAB — COMPREHENSIVE METABOLIC PANEL
Albumin: 3.4 g/dL (ref 3.4–5.0)
Alkaline Phosphatase: 127 U/L (ref 50–136)
Anion Gap: 9 (ref 7–16)
BUN: 16 mg/dL (ref 7–18)
Bilirubin,Total: 0.2 mg/dL (ref 0.2–1.0)
Calcium, Total: 9.2 mg/dL (ref 8.5–10.1)
Chloride: 103 mmol/L (ref 98–107)
Co2: 29 mmol/L (ref 21–32)
Creatinine: 1.01 mg/dL (ref 0.60–1.30)
EGFR (African American): >60
Osmolality: 289 (ref 275–301)
SGOT(AST): 18 U/L (ref 15–37)
SGPT (ALT): 26 U/L (ref 12–78)
Sodium: 141 mmol/L (ref 136–145)
Total Protein: 7.5 g/dL (ref 6.4–8.2)

## 2012-08-31 ENCOUNTER — Ambulatory Visit (INDEPENDENT_AMBULATORY_CARE_PROVIDER_SITE_OTHER): Payer: BC Managed Care – PPO | Admitting: Family Medicine

## 2012-08-31 ENCOUNTER — Encounter: Payer: Self-pay | Admitting: Family Medicine

## 2012-08-31 VITALS — BP 130/68 | HR 91 | Temp 98.7°F | Ht 65.75 in | Wt 217.5 lb

## 2012-08-31 DIAGNOSIS — E785 Hyperlipidemia, unspecified: Secondary | ICD-10-CM

## 2012-08-31 DIAGNOSIS — E1165 Type 2 diabetes mellitus with hyperglycemia: Secondary | ICD-10-CM

## 2012-08-31 DIAGNOSIS — Z23 Encounter for immunization: Secondary | ICD-10-CM

## 2012-08-31 LAB — HEMOGLOBIN A1C: Hgb A1c MFr Bld: 8.5 % — ABNORMAL HIGH (ref 4.6–6.5)

## 2012-08-31 MED ORDER — GLIPIZIDE ER 10 MG PO TB24: 10.0000 mg | ORAL_TABLET | Freq: Every day | ORAL | Status: DC

## 2012-08-31 MED ORDER — OMEPRAZOLE 20 MG PO CPDR: 20.0000 mg | DELAYED_RELEASE_CAPSULE | Freq: Every day | ORAL | Status: DC

## 2012-08-31 MED ORDER — LISINOPRIL 5 MG PO TABS: 5.0000 mg | ORAL_TABLET | Freq: Every day | ORAL | Status: DC

## 2012-08-31 NOTE — Progress Notes (Signed)
  Subjective:    Patient ID: Suzanne Strong, female    DOB: 03/29/1945, 67 y.o.   MRN: [id number]  HPI Here for f/u of DM2  Last visit was getting ready to start DM teaching- classes are great- she has really enjoyed it  Learned a lot about eating  Eating less fruit  Has cut down on sugar intake - still sweetens with sugar but changed her portion sizes  Changed to soy milk Is walking more -and will add strength training (is a member of a women's only gym and will meet with a trainer)  Diabetes Her oncologist went ahead and referred her to endocrinologist  Home sugar results -not optimal , tends to be high in the mornings (around 200 to 240) , later in the day around 140-160) -is making progress  Does not have a bedtime snack at all  DM diet - has made changes / wt is down 2-3 lb with bmi of 31 , and drinking more water  Exercise 4-5 days per week walks 30 minutes and increasing that  Symptoms  A1C last  Lab Results  Component Value Date   HGBA1C 8.7* 05/25/2012    No problems with medications -on metformin - makes her nauseated and loose stool  Renal protection-none  Last eye exam was in feb -ok    BP: 130/68 mmHg    Lab Results  Component Value Date   CHOL 190 05/25/2012   HDL 38.30* 05/25/2012   LDLCALC 92 07/31/2011   LDLDIRECT 116.1 05/25/2012   TRIG 290.0* 05/25/2012   CHOLHDL 5 05/25/2012      Review of Systems Review of Systems  Constitutional: Negative for fever, appetite change, fatigue and unexpected weight change.  Eyes: Negative for pain and visual disturbance.  Respiratory: Negative for cough and shortness of breath.   Cardiovascular: Negative for cp or palpitations    Gastrointestinal: Negative for  Constipation, abd pain or blood in stool.  Genitourinary: Negative for urgency and frequency. neg for excessive thirst  Skin: Negative for pallor or rash   Neurological: Negative for weakness, light-headedness, numbness and headaches.  Hematological: Negative for  adenopathy. Does not bruise/bleed easily.  Psychiatric/Behavioral: Negative for dysphoric mood. The patient is not nervous/anxious.         Objective:   Physical Exam  Constitutional: She appears well-developed and well-nourished. No distress.  HENT:  Head: Normocephalic and atraumatic.  Mouth/Throat: Oropharynx is clear and moist.  Eyes: Conjunctivae and EOM are normal. Pupils are equal, round, and reactive to light. Right eye exhibits no discharge. Left eye exhibits no discharge. No scleral icterus.  Neck: Normal range of motion. Neck supple. No JVD present. No thyromegaly present.  Cardiovascular: Normal rate, regular rhythm, normal heart sounds and intact distal pulses.  Exam reveals no gallop.   Pulmonary/Chest: Effort normal and breath sounds normal. No respiratory distress. She has no wheezes. She has no rales.  Abdominal: Soft. Bowel sounds are normal. She exhibits no distension and no mass. There is no tenderness.  Musculoskeletal: She exhibits no edema and no tenderness.  Lymphadenopathy:    She has no cervical adenopathy.  Neurological: She is alert. She has normal reflexes. No cranial nerve deficit. She exhibits normal muscle tone. Coordination normal.  Skin: Skin is warm and dry. No rash noted. No erythema. No pallor.  Psychiatric: She has a normal mood and affect.          Assessment & Plan:

## 2012-08-31 NOTE — Patient Instructions (Addendum)
Lab today for A1C  Schedule fasting lab in 2 weeks for cholesterol and comp panel - due to new medicines  Start glipizide 10 mg once daily in am - if low sugars -alert me  Continue metformin and diet and exercise  Start lisinopril 5 mg daily for kidney protection  After this, you will be seeing the endocrinologist for diabetes  Pneumonia vaccine today

## 2012-09-01 NOTE — Assessment & Plan Note (Signed)
Disc goals for lipids and reasons to control them Rev labs with pt Rev low sat fat diet in detail On lipitor and diet  

## 2012-09-01 NOTE — Assessment & Plan Note (Addendum)
Enc to keep up lifestyle change from her DM ed and to finish the last class  A1c today Her oncol ref to endo  Sugars still high-and will not tolerate inc in metformin due to GI side eff  Added glipizide xl 10 - with warnings about hypoglycemia  Labs planned  Will f/u with endo when she gets her appt and take it from there Is motivated to loose wt and change lifestyle Pneumovax today Lisinopril 5 for renal protection

## 2012-09-12 ENCOUNTER — Encounter: Payer: Self-pay | Admitting: Family Medicine

## 2012-09-14 ENCOUNTER — Other Ambulatory Visit (INDEPENDENT_AMBULATORY_CARE_PROVIDER_SITE_OTHER): Payer: BC Managed Care – PPO

## 2012-09-14 DIAGNOSIS — E785 Hyperlipidemia, unspecified: Secondary | ICD-10-CM

## 2012-09-14 DIAGNOSIS — IMO0002 Reserved for concepts with insufficient information to code with codable children: Secondary | ICD-10-CM

## 2012-09-14 DIAGNOSIS — E1165 Type 2 diabetes mellitus with hyperglycemia: Secondary | ICD-10-CM

## 2012-09-14 LAB — LIPID PANEL
Cholesterol: 178 mg/dL (ref 0–200)
HDL: 39 mg/dL — ABNORMAL LOW (ref >39.00)
LDL Cholesterol: 110 mg/dL — ABNORMAL HIGH (ref 0–99)
Triglycerides: 146 mg/dL (ref 0.0–149.0)
VLDL: 29.2 mg/dL (ref 0.0–40.0)

## 2012-09-14 LAB — COMPREHENSIVE METABOLIC PANEL
Alkaline Phosphatase: 84 U/L (ref 39–117)
CO2: 28 mEq/L (ref 19–32)
Creatinine, Ser: 0.8 mg/dL (ref 0.4–1.2)
GFR: 73.38 mL/min (ref >60.00)
Glucose, Bld: 142 mg/dL — ABNORMAL HIGH (ref 70–99)
Total Bilirubin: 0.4 mg/dL (ref 0.3–1.2)

## 2012-09-15 ENCOUNTER — Other Ambulatory Visit: Payer: Self-pay | Admitting: Family Medicine

## 2012-09-16 ENCOUNTER — Ambulatory Visit: Payer: Self-pay | Admitting: Oncology

## 2012-09-16 ENCOUNTER — Encounter: Payer: Self-pay | Admitting: Family Medicine

## 2012-10-23 ENCOUNTER — Other Ambulatory Visit: Payer: Self-pay | Admitting: Family Medicine

## 2012-11-01 ENCOUNTER — Ambulatory Visit: Payer: Self-pay | Admitting: Oncology

## 2012-11-14 ENCOUNTER — Other Ambulatory Visit: Payer: Self-pay | Admitting: Family Medicine

## 2012-12-08 DIAGNOSIS — Z0189 Encounter for other specified special examinations: Secondary | ICD-10-CM | POA: Diagnosis not present

## 2012-12-08 DIAGNOSIS — D485 Neoplasm of uncertain behavior of skin: Secondary | ICD-10-CM | POA: Diagnosis not present

## 2012-12-08 DIAGNOSIS — D239 Other benign neoplasm of skin, unspecified: Secondary | ICD-10-CM | POA: Diagnosis not present

## 2012-12-08 DIAGNOSIS — L570 Actinic keratosis: Secondary | ICD-10-CM | POA: Diagnosis not present

## 2012-12-08 DIAGNOSIS — L719 Rosacea, unspecified: Secondary | ICD-10-CM | POA: Diagnosis not present

## 2012-12-08 DIAGNOSIS — Z85828 Personal history of other malignant neoplasm of skin: Secondary | ICD-10-CM | POA: Diagnosis not present

## 2012-12-14 ENCOUNTER — Other Ambulatory Visit: Payer: Self-pay | Admitting: *Deleted

## 2012-12-14 MED ORDER — METFORMIN HCL 500 MG PO TABS: ORAL_TABLET | ORAL | Status: DC

## 2012-12-14 MED ORDER — ATORVASTATIN CALCIUM 10 MG PO TABS: ORAL_TABLET | ORAL | Status: DC

## 2012-12-14 NOTE — Telephone Encounter (Signed)
Please refill for 6 months-thanks  

## 2012-12-14 NOTE — Telephone Encounter (Signed)
Last filled 11/10/12

## 2012-12-15 MED ORDER — ROPINIROLE HCL 1 MG PO TABS: ORAL_TABLET | ORAL | Status: DC

## 2012-12-15 NOTE — Telephone Encounter (Signed)
done

## 2012-12-28 DIAGNOSIS — F411 Generalized anxiety disorder: Secondary | ICD-10-CM | POA: Diagnosis not present

## 2013-01-23 DIAGNOSIS — H1045 Other chronic allergic conjunctivitis: Secondary | ICD-10-CM | POA: Diagnosis not present

## 2013-01-24 ENCOUNTER — Telehealth: Payer: Self-pay

## 2013-01-24 NOTE — Telephone Encounter (Signed)
Brooke with [employer] left v/m requesting status of form faxed to Dr Milinda Antis on 01/19/13; pt has new medicare and med part B requirement.Please advise.

## 2013-01-25 NOTE — Telephone Encounter (Signed)
Advise pharmacy that they need to fax form to Dr. Tedd Sias with [facility]

## 2013-01-25 NOTE — Telephone Encounter (Signed)
Form placed in your inbox

## 2013-01-25 NOTE — Telephone Encounter (Signed)
She now sees Dr Tedd Sias for her DM -this needs to go there

## 2013-01-30 DIAGNOSIS — C44611 Basal cell carcinoma of skin of unspecified upper limb, including shoulder: Secondary | ICD-10-CM | POA: Diagnosis not present

## 2013-01-30 DIAGNOSIS — C4441 Basal cell carcinoma of skin of scalp and neck: Secondary | ICD-10-CM | POA: Diagnosis not present

## 2013-01-31 DIAGNOSIS — C44519 Basal cell carcinoma of skin of other part of trunk: Secondary | ICD-10-CM | POA: Diagnosis not present

## 2013-01-31 DIAGNOSIS — L719 Rosacea, unspecified: Secondary | ICD-10-CM | POA: Diagnosis not present

## 2013-01-31 DIAGNOSIS — C4441 Basal cell carcinoma of skin of scalp and neck: Secondary | ICD-10-CM | POA: Diagnosis not present

## 2013-02-17 ENCOUNTER — Other Ambulatory Visit: Payer: Self-pay | Admitting: *Deleted

## 2013-02-17 MED ORDER — FLUTICASONE PROPIONATE 50 MCG/ACT NA SUSP: NASAL | Status: DC

## 2013-02-28 ENCOUNTER — Ambulatory Visit: Payer: Self-pay | Admitting: Oncology

## 2013-02-28 DIAGNOSIS — L719 Rosacea, unspecified: Secondary | ICD-10-CM | POA: Diagnosis not present

## 2013-02-28 DIAGNOSIS — Z79899 Other long term (current) drug therapy: Secondary | ICD-10-CM | POA: Diagnosis not present

## 2013-02-28 DIAGNOSIS — Z9221 Personal history of antineoplastic chemotherapy: Secondary | ICD-10-CM | POA: Diagnosis not present

## 2013-02-28 DIAGNOSIS — Z9089 Acquired absence of other organs: Secondary | ICD-10-CM | POA: Diagnosis not present

## 2013-02-28 DIAGNOSIS — C4441 Basal cell carcinoma of skin of scalp and neck: Secondary | ICD-10-CM | POA: Diagnosis not present

## 2013-02-28 DIAGNOSIS — R5381 Other malaise: Secondary | ICD-10-CM | POA: Diagnosis not present

## 2013-02-28 DIAGNOSIS — F411 Generalized anxiety disorder: Secondary | ICD-10-CM | POA: Diagnosis not present

## 2013-02-28 DIAGNOSIS — E669 Obesity, unspecified: Secondary | ICD-10-CM | POA: Diagnosis not present

## 2013-02-28 DIAGNOSIS — C44519 Basal cell carcinoma of skin of other part of trunk: Secondary | ICD-10-CM | POA: Diagnosis not present

## 2013-02-28 DIAGNOSIS — Z85819 Personal history of malignant neoplasm of unspecified site of lip, oral cavity, and pharynx: Secondary | ICD-10-CM | POA: Diagnosis not present

## 2013-02-28 DIAGNOSIS — Z923 Personal history of irradiation: Secondary | ICD-10-CM | POA: Diagnosis not present

## 2013-02-28 DIAGNOSIS — J069 Acute upper respiratory infection, unspecified: Secondary | ICD-10-CM | POA: Diagnosis not present

## 2013-02-28 DIAGNOSIS — E7800 Pure hypercholesterolemia, unspecified: Secondary | ICD-10-CM | POA: Diagnosis not present

## 2013-02-28 DIAGNOSIS — Z87891 Personal history of nicotine dependence: Secondary | ICD-10-CM | POA: Diagnosis not present

## 2013-02-28 DIAGNOSIS — L905 Scar conditions and fibrosis of skin: Secondary | ICD-10-CM | POA: Diagnosis not present

## 2013-02-28 DIAGNOSIS — R5383 Other fatigue: Secondary | ICD-10-CM | POA: Diagnosis not present

## 2013-02-28 LAB — COMPREHENSIVE METABOLIC PANEL
ALK PHOS: 103 U/L
ALT: 23 U/L (ref 12–78)
Albumin: 3.6 g/dL (ref 3.4–5.0)
Anion Gap: 6 — ABNORMAL LOW (ref 7–16)
BILIRUBIN TOTAL: 0.2 mg/dL (ref 0.2–1.0)
BUN: 16 mg/dL (ref 7–18)
CALCIUM: 9.3 mg/dL (ref 8.5–10.1)
CHLORIDE: 102 mmol/L (ref 98–107)
Co2: 31 mmol/L (ref 21–32)
Creatinine: 1.11 mg/dL (ref 0.60–1.30)
EGFR (Non-African Amer.): 52 — ABNORMAL LOW
GLUCOSE: 165 mg/dL — AB (ref 65–99)
OSMOLALITY: 282 (ref 275–301)
POTASSIUM: 4.9 mmol/L (ref 3.5–5.1)
SGOT(AST): 22 U/L (ref 15–37)
Sodium: 139 mmol/L (ref 136–145)
TOTAL PROTEIN: 7.8 g/dL (ref 6.4–8.2)

## 2013-02-28 LAB — CBC CANCER CENTER
BASOS PCT: 0.8 %
Basophil #: 0.1 x10 3/mm (ref 0.0–0.1)
Eosinophil #: 0.2 x10 3/mm (ref 0.0–0.7)
Eosinophil %: 1.7 %
HCT: 37.9 % (ref 35.0–47.0)
HGB: 12 g/dL (ref 12.0–16.0)
LYMPHS PCT: 14.1 %
Lymphocyte #: 1.3 x10 3/mm (ref 1.0–3.6)
MCH: 25.6 pg — ABNORMAL LOW (ref 26.0–34.0)
MCHC: 31.6 g/dL — AB (ref 32.0–36.0)
MCV: 81 fL (ref 80–100)
Monocyte #: 0.5 x10 3/mm (ref 0.2–0.9)
Monocyte %: 5.2 %
Neutrophil #: 7.2 x10 3/mm — ABNORMAL HIGH (ref 1.4–6.5)
Neutrophil %: 78.2 %
PLATELETS: 314 x10 3/mm (ref 150–440)
RBC: 4.67 10*6/uL (ref 3.80–5.20)
RDW: 15.9 % — AB (ref 11.5–14.5)
WBC: 9.2 x10 3/mm (ref 3.6–11.0)

## 2013-03-03 DIAGNOSIS — J0110 Acute frontal sinusitis, unspecified: Secondary | ICD-10-CM | POA: Diagnosis not present

## 2013-03-03 DIAGNOSIS — Z85819 Personal history of malignant neoplasm of unspecified site of lip, oral cavity, and pharynx: Secondary | ICD-10-CM | POA: Diagnosis not present

## 2013-03-03 DIAGNOSIS — J328 Other chronic sinusitis: Secondary | ICD-10-CM | POA: Diagnosis not present

## 2013-03-19 ENCOUNTER — Ambulatory Visit: Payer: Self-pay | Admitting: Oncology

## 2013-03-22 DIAGNOSIS — F411 Generalized anxiety disorder: Secondary | ICD-10-CM | POA: Diagnosis not present

## 2013-03-28 ENCOUNTER — Ambulatory Visit: Payer: Self-pay | Admitting: Otolaryngology

## 2013-03-28 DIAGNOSIS — J3489 Other specified disorders of nose and nasal sinuses: Secondary | ICD-10-CM | POA: Diagnosis not present

## 2013-03-31 DIAGNOSIS — J328 Other chronic sinusitis: Secondary | ICD-10-CM | POA: Diagnosis not present

## 2013-03-31 DIAGNOSIS — Z85819 Personal history of malignant neoplasm of unspecified site of lip, oral cavity, and pharynx: Secondary | ICD-10-CM | POA: Diagnosis not present

## 2013-04-03 DIAGNOSIS — H04129 Dry eye syndrome of unspecified lacrimal gland: Secondary | ICD-10-CM | POA: Diagnosis not present

## 2013-04-06 ENCOUNTER — Ambulatory Visit: Payer: Self-pay | Admitting: Otolaryngology

## 2013-04-06 DIAGNOSIS — I119 Hypertensive heart disease without heart failure: Secondary | ICD-10-CM | POA: Diagnosis not present

## 2013-04-06 DIAGNOSIS — Z01810 Encounter for preprocedural cardiovascular examination: Secondary | ICD-10-CM | POA: Diagnosis not present

## 2013-04-06 DIAGNOSIS — Z01812 Encounter for preprocedural laboratory examination: Secondary | ICD-10-CM | POA: Diagnosis not present

## 2013-04-06 DIAGNOSIS — J329 Chronic sinusitis, unspecified: Secondary | ICD-10-CM | POA: Diagnosis not present

## 2013-04-06 LAB — CBC WITH DIFFERENTIAL/PLATELET
BASOS ABS: 0 10*3/uL (ref 0.0–0.1)
Basophil %: 0.8 %
EOS PCT: 2.6 %
Eosinophil #: 0.1 10*3/uL (ref 0.0–0.7)
HCT: 34.1 % — ABNORMAL LOW (ref 35.0–47.0)
HGB: 11.2 g/dL — ABNORMAL LOW (ref 12.0–16.0)
Lymphocyte #: 1.1 10*3/uL (ref 1.0–3.6)
Lymphocyte %: 21.1 %
MCH: 26.7 pg (ref 26.0–34.0)
MCHC: 32.7 g/dL (ref 32.0–36.0)
MCV: 81 fL (ref 80–100)
Monocyte #: 0.3 x10 3/mm (ref 0.2–0.9)
Monocyte %: 6.5 %
Neutrophil #: 3.5 10*3/uL (ref 1.4–6.5)
Neutrophil %: 69 %
Platelet: 258 10*3/uL (ref 150–440)
RBC: 4.19 10*6/uL (ref 3.80–5.20)
RDW: 15.9 % — ABNORMAL HIGH (ref 11.5–14.5)
WBC: 5.1 10*3/uL (ref 3.6–11.0)

## 2013-04-06 LAB — BASIC METABOLIC PANEL
Anion Gap: 2 — ABNORMAL LOW (ref 7–16)
BUN: 14 mg/dL (ref 7–18)
CREATININE: 0.8 mg/dL (ref 0.60–1.30)
Calcium, Total: 9.3 mg/dL (ref 8.5–10.1)
Chloride: 105 mmol/L (ref 98–107)
Co2: 29 mmol/L (ref 21–32)
EGFR (African American): >60
EGFR (Non-African Amer.): >60
Glucose: 143 mg/dL — ABNORMAL HIGH (ref 65–99)
Osmolality: 275 (ref 275–301)
Potassium: 4.2 mmol/L (ref 3.5–5.1)
Sodium: 136 mmol/L (ref 136–145)

## 2013-04-07 ENCOUNTER — Telehealth: Payer: Self-pay | Admitting: *Deleted

## 2013-04-07 ENCOUNTER — Ambulatory Visit (INDEPENDENT_AMBULATORY_CARE_PROVIDER_SITE_OTHER): Payer: Medicare Other | Admitting: Family Medicine

## 2013-04-07 ENCOUNTER — Encounter: Payer: Self-pay | Admitting: Family Medicine

## 2013-04-07 VITALS — BP 116/68 | HR 84 | Temp 98.1°F | Ht 65.75 in | Wt 222.5 lb

## 2013-04-07 DIAGNOSIS — E1165 Type 2 diabetes mellitus with hyperglycemia: Secondary | ICD-10-CM

## 2013-04-07 DIAGNOSIS — IMO0001 Reserved for inherently not codable concepts without codable children: Secondary | ICD-10-CM | POA: Diagnosis not present

## 2013-04-07 DIAGNOSIS — IMO0002 Reserved for concepts with insufficient information to code with codable children: Secondary | ICD-10-CM

## 2013-04-07 DIAGNOSIS — Z01818 Encounter for other preprocedural examination: Secondary | ICD-10-CM

## 2013-04-07 NOTE — Progress Notes (Signed)
Subjective:    Patient ID: Suzanne Strong, female    DOB: 1945-12-05, 68 y.o.   MRN: [id number]  HPI Here for a pre operative exam   Having sinus surgery (bilat frontal/ ethmoid image guided) with Dr Carlis Abbott  She started having a lot more congestion - one side is more blocked than another  Has had this done before - right before her sinus surgery  Had chemo and radition- ? Dev scar tissue   Had EKG yesterday- all was ok at the hosp  Was ok  Also labs   Endocrine switched her metformin to janumet -no more nausea or diarrhea  Her glucose is never over 140 at this point  Will have A1C in march   No allergies to any numbing meds or anesthesia  Never had bad nausea from anethesia  rash - from some meds in the past - never had anaphylaxis from anything  Hives but no trouble breathing  Not on aspirin or any blood thinners  Non smoker   The am of surgery she will take xanax and prilosec with a sip of water   Surgery date is next Thursday   No heart issues at all  No asthma or reactive airways   Patient Active Problem List   Diagnosis Date Noted  . Pre-operative exam 04/07/2013  . Chronic neck pain 07/25/2010  . Diabetes type 2, uncontrolled 07/07/2010  . Depression 07/07/2010  . Routine general medical examination at a health care facility 06/26/2010  . VITAMIN B12 DEFICIENCY 09/04/2009  . UNSPECIFIED ANEMIA 08/29/2009  . ANXIETY 08/26/2009  . GASTROPARESIS 08/26/2009  . UNSPECIFIED VITAMIN D DEFICIENCY 07/19/2008  . OSTEOPENIA 07/19/2007  . MALIGNANT NEOPLASM OF TONSIL 07/18/2007  . HYPERLIPIDEMIA 07/18/2007  . EDEMA 07/18/2007   Past Medical History  Diagnosis Date  . Hyperlipidemia   . Tonsillar cancer   . Diverticulosis   . Shingles     Hx of  . Gastroparesis     related to previous radiation tx  . Anxiety   . Anemia     iron deficiency and B12 deficiency  . Cervical dysplasia     conization   Past Surgical History  Procedure Laterality Date  .  Breast surgery      breast biopsy benign  . Appendectomy    . Tonsillectomy      cancer treated with chemo and radiation  . Cholecystectomy    . Mole removal    . Esophagogastroduodenoscopy  07/2009    normal with few gastric polyps   History  Substance Use Topics  . Smoking status: Former Research scientist (life sciences)  . Smokeless tobacco: Never Used  . Alcohol Use: No   Family History  Problem Relation Age of Onset  . Osteoporosis Mother   . Hyperlipidemia Mother   . Depression Mother   . Cancer Mother     skin cancer ? basal cell and lung ca heavy smoker  . Alcohol abuse Father   . Cancer Father     skin CA ? basal cell  . Heart disease Father     CHF  . Hyperlipidemia Brother   . Hypertension Brother    Allergies  Allergen Reactions  . Amoxicillin     REACTION: rash  . Fluconazole     REACTION: hives  . Gabapentin     depression   Current Outpatient Prescriptions on File Prior to Visit  Medication Sig Dispense Refill  . ALPRAZolam (XANAX) 0.5 MG tablet Take 1 tablet (0.5 mg total)  by mouth at bedtime as needed for sleep.  30 tablet  3  . atorvastatin (LIPITOR) 10 MG tablet take 1 tablet by mouth once daily  30 tablet  6  . b complex vitamins tablet Take 1 tablet by mouth daily.        . chlorpheniramine-HYDROcodone (TUSSIONEX PENNKINETIC ER) 10-8 MG/5ML LQCR Take 5 mLs by mouth every 12 (twelve) hours as needed.  140 mL  0  . cyanocobalamin (,VITAMIN B-12,) 1000 MCG/ML injection Inject 1,000 mcg into the muscle every 30 (thirty) days.        . ferrous sulfate 325 (65 FE) MG tablet Take 325 mg by mouth daily with breakfast.        . fluticasone (FLONASE) 50 MCG/ACT nasal spray instill 2 sprays into each nostril once daily  16 g  2  . glipiZIDE (GLUCOTROL XL) 10 MG 24 hr tablet Take 1 tablet (10 mg total) by mouth daily.  30 tablet  11  . glucose blood (ONE TOUCH TEST STRIPS) test strip Use as instructed to test blood sugar once daily or as directed  100 each  3  . lisinopril  (PRINIVIL,ZESTRIL) 5 MG tablet Take 1 tablet (5 mg total) by mouth daily.  30 tablet  11  . mirtazapine (REMERON) 15 MG tablet Take 45 mg by mouth at bedtime.       [name] omeprazole (PRILOSEC) 20 MG capsule Take 1 capsule (20 mg total) by mouth daily.  30 capsule  11  . ONETOUCH DELICA LANCETS FINE MISC 1 Device by Does not apply route as directed. Use to test blood sugar once daily or as directed  100 each  3  . Polyethylene Glycol 3350 (MIRALAX PO) Take by mouth as needed.      . Probiotic Product (ALIGN PO) Take 1 tablet by mouth daily.      [name] rOPINIRole (REQUIP) 1 MG tablet TAKE 1 TABLET BY MOUTH IN THE DAY TIME AND 2 TABLETS AT BEDTIME AS NEEDED FOR RESTLESS LEGS  90 tablet  5  . sertraline (ZOLOFT) 50 MG tablet Take 50 mg by mouth daily.      [name] spironolactone (ALDACTONE) 25 MG tablet take 1 tablet by mouth once daily  30 tablet  5  . traMADol (ULTRAM) 50 MG tablet Take 1 tablet by mouth as needed.       No current facility-administered medications on file prior to visit.    Review of Systems Review of Systems  Constitutional: Negative for fever, appetite change, fatigue and unexpected weight change.  Eyes: Negative for pain and visual disturbance.  Respiratory: Negative for cough and shortness of breath.   Cardiovascular: Negative for cp or palpitations    Gastrointestinal: Negative for nausea, diarrhea and constipation.  Genitourinary: Negative for urgency and frequency.  Skin: Negative for pallor or rash   Neurological: Negative for weakness, light-headedness, numbness and headaches.  Hematological: Negative for adenopathy. Does not bruise/bleed easily.  Psychiatric/Behavioral: Negative for dysphoric mood. The patient is not nervous/anxious.         Objective:   Physical Exam  Constitutional: She appears well-developed and well-nourished. No distress.  obese and well appearing   HENT:  Head: Normocephalic and atraumatic.  Right Ear: External ear normal.  Left Ear: External ear  normal.  Baseline surgical changes in throat  Dry mouth   Nares are boggy No sinus tenderness  Eyes: Conjunctivae and EOM are normal. Pupils are equal, round, and reactive to light. Right eye exhibits no discharge.  Left eye exhibits no discharge. No scleral icterus.  Neck: Normal range of motion. Neck supple. No JVD present. Carotid bruit is not present. No thyromegaly present.  Cardiovascular: Normal rate, regular rhythm, normal heart sounds and intact distal pulses.  Exam reveals no gallop.   Pulmonary/Chest: Effort normal and breath sounds normal. No respiratory distress. She has no wheezes. She has no rales.  No wheeze or rales   Abdominal: Soft. Bowel sounds are normal. She exhibits no distension, no abdominal bruit and no mass. There is no tenderness.  Musculoskeletal: She exhibits no edema.  Lymphadenopathy:    She has no cervical adenopathy.  Neurological: She is alert. She has normal reflexes. No cranial nerve deficit. She exhibits normal muscle tone. Coordination normal.  Skin: Skin is warm and dry. No rash noted. No erythema. No pallor.  Psychiatric: She has a normal mood and affect.          Assessment & Plan:

## 2013-04-07 NOTE — Assessment & Plan Note (Signed)
Doing very well overall -no contraindications for surgery (sinus surgery) Rev med allergies and rxn/ hx of anesthesia  Had EKG and labs at hosp yesterday Glucose has been much better controlled - did make note to Dr Carlis Abbott to watch her blood sugar during surgery (will hold januvamet for the day until eating)

## 2013-04-07 NOTE — Progress Notes (Signed)
Pre-visit discussion using our clinic review tool. No additional management support is needed unless otherwise documented below in the visit note.  

## 2013-04-07 NOTE — Patient Instructions (Signed)
I'm glad you are doing well and diabetes is improved  No restrictions for upcoming surgery- good luck with everything

## 2013-04-07 NOTE — Telephone Encounter (Signed)
Pre opt clearance form faxed to Dr. April Holding Clark's office

## 2013-04-13 ENCOUNTER — Ambulatory Visit: Payer: Self-pay | Admitting: Otolaryngology

## 2013-04-13 DIAGNOSIS — Z885 Allergy status to narcotic agent status: Secondary | ICD-10-CM | POA: Diagnosis not present

## 2013-04-13 DIAGNOSIS — F411 Generalized anxiety disorder: Secondary | ICD-10-CM | POA: Diagnosis not present

## 2013-04-13 DIAGNOSIS — Z880 Allergy status to penicillin: Secondary | ICD-10-CM | POA: Diagnosis not present

## 2013-04-13 DIAGNOSIS — J322 Chronic ethmoidal sinusitis: Secondary | ICD-10-CM | POA: Diagnosis not present

## 2013-04-13 DIAGNOSIS — E119 Type 2 diabetes mellitus without complications: Secondary | ICD-10-CM | POA: Diagnosis not present

## 2013-04-13 DIAGNOSIS — J321 Chronic frontal sinusitis: Secondary | ICD-10-CM | POA: Diagnosis not present

## 2013-04-13 DIAGNOSIS — Z85819 Personal history of malignant neoplasm of unspecified site of lip, oral cavity, and pharynx: Secondary | ICD-10-CM | POA: Diagnosis not present

## 2013-04-13 DIAGNOSIS — Z883 Allergy status to other anti-infective agents status: Secondary | ICD-10-CM | POA: Diagnosis not present

## 2013-04-13 DIAGNOSIS — K219 Gastro-esophageal reflux disease without esophagitis: Secondary | ICD-10-CM | POA: Diagnosis not present

## 2013-04-13 DIAGNOSIS — J329 Chronic sinusitis, unspecified: Secondary | ICD-10-CM | POA: Diagnosis not present

## 2013-04-13 DIAGNOSIS — E669 Obesity, unspecified: Secondary | ICD-10-CM | POA: Diagnosis not present

## 2013-04-13 DIAGNOSIS — J328 Other chronic sinusitis: Secondary | ICD-10-CM | POA: Diagnosis not present

## 2013-04-14 LAB — PATHOLOGY REPORT

## 2013-04-18 DIAGNOSIS — J328 Other chronic sinusitis: Secondary | ICD-10-CM | POA: Diagnosis not present

## 2013-04-19 DIAGNOSIS — IMO0001 Reserved for inherently not codable concepts without codable children: Secondary | ICD-10-CM | POA: Diagnosis not present

## 2013-04-25 DIAGNOSIS — E119 Type 2 diabetes mellitus without complications: Secondary | ICD-10-CM | POA: Diagnosis not present

## 2013-04-27 DIAGNOSIS — J328 Other chronic sinusitis: Secondary | ICD-10-CM | POA: Diagnosis not present

## 2013-04-27 DIAGNOSIS — J3489 Other specified disorders of nose and nasal sinuses: Secondary | ICD-10-CM | POA: Diagnosis not present

## 2013-05-04 DIAGNOSIS — J3489 Other specified disorders of nose and nasal sinuses: Secondary | ICD-10-CM | POA: Diagnosis not present

## 2013-05-04 DIAGNOSIS — J328 Other chronic sinusitis: Secondary | ICD-10-CM | POA: Diagnosis not present

## 2013-05-15 DIAGNOSIS — H04129 Dry eye syndrome of unspecified lacrimal gland: Secondary | ICD-10-CM | POA: Diagnosis not present

## 2013-05-17 ENCOUNTER — Other Ambulatory Visit: Payer: Self-pay | Admitting: *Deleted

## 2013-05-17 MED ORDER — FLUTICASONE PROPIONATE 50 MCG/ACT NA SUSP: NASAL | Status: DC

## 2013-05-18 DIAGNOSIS — J3489 Other specified disorders of nose and nasal sinuses: Secondary | ICD-10-CM | POA: Diagnosis not present

## 2013-05-18 DIAGNOSIS — J328 Other chronic sinusitis: Secondary | ICD-10-CM | POA: Diagnosis not present

## 2013-06-05 DIAGNOSIS — F411 Generalized anxiety disorder: Secondary | ICD-10-CM | POA: Diagnosis not present

## 2013-06-06 ENCOUNTER — Other Ambulatory Visit: Payer: Self-pay | Admitting: Family Medicine

## 2013-06-06 NOTE — Telephone Encounter (Signed)
Will refill electronically  

## 2013-06-06 NOTE — Telephone Encounter (Signed)
Electronic refill request, please advise  

## 2013-06-13 DIAGNOSIS — H04129 Dry eye syndrome of unspecified lacrimal gland: Secondary | ICD-10-CM | POA: Diagnosis not present

## 2013-07-02 ENCOUNTER — Other Ambulatory Visit: Payer: Self-pay | Admitting: Family Medicine

## 2013-07-29 ENCOUNTER — Other Ambulatory Visit: Payer: Self-pay | Admitting: Family Medicine

## 2013-08-01 ENCOUNTER — Ambulatory Visit: Payer: Self-pay | Admitting: Oncology

## 2013-08-14 ENCOUNTER — Other Ambulatory Visit: Payer: Self-pay | Admitting: Family Medicine

## 2013-08-24 ENCOUNTER — Ambulatory Visit: Payer: Self-pay | Admitting: Oncology

## 2013-08-24 DIAGNOSIS — J3489 Other specified disorders of nose and nasal sinuses: Secondary | ICD-10-CM | POA: Diagnosis not present

## 2013-08-24 DIAGNOSIS — R059 Cough, unspecified: Secondary | ICD-10-CM | POA: Diagnosis not present

## 2013-08-24 DIAGNOSIS — Z923 Personal history of irradiation: Secondary | ICD-10-CM | POA: Diagnosis not present

## 2013-08-24 DIAGNOSIS — E7800 Pure hypercholesterolemia, unspecified: Secondary | ICD-10-CM | POA: Diagnosis not present

## 2013-08-24 DIAGNOSIS — Z85819 Personal history of malignant neoplasm of unspecified site of lip, oral cavity, and pharynx: Secondary | ICD-10-CM | POA: Diagnosis not present

## 2013-08-24 DIAGNOSIS — Z9089 Acquired absence of other organs: Secondary | ICD-10-CM | POA: Diagnosis not present

## 2013-08-24 DIAGNOSIS — Z87891 Personal history of nicotine dependence: Secondary | ICD-10-CM | POA: Diagnosis not present

## 2013-08-24 DIAGNOSIS — R490 Dysphonia: Secondary | ICD-10-CM | POA: Diagnosis not present

## 2013-08-24 DIAGNOSIS — Z79899 Other long term (current) drug therapy: Secondary | ICD-10-CM | POA: Diagnosis not present

## 2013-08-24 DIAGNOSIS — Z9221 Personal history of antineoplastic chemotherapy: Secondary | ICD-10-CM | POA: Diagnosis not present

## 2013-08-24 DIAGNOSIS — E119 Type 2 diabetes mellitus without complications: Secondary | ICD-10-CM | POA: Diagnosis not present

## 2013-08-24 LAB — COMPREHENSIVE METABOLIC PANEL
ALK PHOS: 87 U/L
Albumin: 3.5 g/dL (ref 3.4–5.0)
Anion Gap: 9 (ref 7–16)
BUN: 11 mg/dL (ref 7–18)
Bilirubin,Total: 0.2 mg/dL (ref 0.2–1.0)
CALCIUM: 9.3 mg/dL (ref 8.5–10.1)
Chloride: 104 mmol/L (ref 98–107)
Co2: 28 mmol/L (ref 21–32)
Creatinine: 0.81 mg/dL (ref 0.60–1.30)
EGFR (African American): >60
GLUCOSE: 109 mg/dL — AB (ref 65–99)
Osmolality: 281 (ref 275–301)
POTASSIUM: 4.1 mmol/L (ref 3.5–5.1)
SGOT(AST): 23 U/L (ref 15–37)
SGPT (ALT): 29 U/L (ref 12–78)
Sodium: 141 mmol/L (ref 136–145)
Total Protein: 7.7 g/dL (ref 6.4–8.2)

## 2013-08-24 LAB — CBC CANCER CENTER
Basophil #: 0 x10 3/mm (ref 0.0–0.1)
Basophil %: 0.6 %
EOS PCT: 1.5 %
Eosinophil #: 0.1 x10 3/mm (ref 0.0–0.7)
HCT: 37.1 % (ref 35.0–47.0)
HGB: 11.8 g/dL — AB (ref 12.0–16.0)
LYMPHS PCT: 15.6 %
Lymphocyte #: 0.9 x10 3/mm — ABNORMAL LOW (ref 1.0–3.6)
MCH: 25.5 pg — AB (ref 26.0–34.0)
MCHC: 31.7 g/dL — AB (ref 32.0–36.0)
MCV: 80 fL (ref 80–100)
MONO ABS: 0.4 x10 3/mm (ref 0.2–0.9)
Monocyte %: 6.6 %
NEUTROS ABS: 4.5 x10 3/mm (ref 1.4–6.5)
Neutrophil %: 75.7 %
PLATELETS: 275 x10 3/mm (ref 150–440)
RBC: 4.62 10*6/uL (ref 3.80–5.20)
RDW: 17 % — AB (ref 11.5–14.5)
WBC: 5.9 x10 3/mm (ref 3.6–11.0)

## 2013-08-24 LAB — TSH: THYROID STIMULATING HORM: 3.13 u[IU]/mL

## 2013-08-28 ENCOUNTER — Telehealth: Payer: Self-pay | Admitting: Family Medicine

## 2013-08-28 DIAGNOSIS — E785 Hyperlipidemia, unspecified: Secondary | ICD-10-CM

## 2013-08-28 DIAGNOSIS — F411 Generalized anxiety disorder: Secondary | ICD-10-CM | POA: Diagnosis not present

## 2013-08-28 NOTE — Telephone Encounter (Signed)
Message copied by [name] on Mon Aug 28, 2013  5:58 PM ------      Message from: Ellamae Sia      Created: Mon Aug 28, 2013  3:22 PM      Regarding: Lab orders for Tuesday, 7.14.15       Pt needs a LDL for diabetic bundle,  Please order that and any other tests, thanks! Terri ------

## 2013-08-30 ENCOUNTER — Other Ambulatory Visit (INDEPENDENT_AMBULATORY_CARE_PROVIDER_SITE_OTHER): Payer: Medicare Other

## 2013-08-30 DIAGNOSIS — E785 Hyperlipidemia, unspecified: Secondary | ICD-10-CM

## 2013-08-30 LAB — COMPREHENSIVE METABOLIC PANEL
ALBUMIN: 3.6 g/dL (ref 3.5–5.2)
ALT: 21 U/L (ref 0–35)
AST: 22 U/L (ref 0–37)
Alkaline Phosphatase: 73 U/L (ref 39–117)
BUN: 14 mg/dL (ref 6–23)
CALCIUM: 9 mg/dL (ref 8.4–10.5)
CHLORIDE: 104 meq/L (ref 96–112)
CO2: 31 mEq/L (ref 19–32)
Creatinine, Ser: 0.8 mg/dL (ref 0.4–1.2)
GFR: 78.6 mL/min (ref >60.00)
GLUCOSE: 137 mg/dL — AB (ref 70–99)
POTASSIUM: 4.4 meq/L (ref 3.5–5.1)
Sodium: 137 mEq/L (ref 135–145)
Total Bilirubin: 0.4 mg/dL (ref 0.2–1.2)
Total Protein: 7.2 g/dL (ref 6.0–8.3)

## 2013-08-30 LAB — LIPID PANEL
Cholesterol: 178 mg/dL (ref 0–200)
HDL: 46.4 mg/dL (ref >39.00)
LDL CALC: 102 mg/dL — AB (ref 0–99)
NonHDL: 131.6
TRIGLYCERIDES: 148 mg/dL (ref 0.0–149.0)
Total CHOL/HDL Ratio: 4
VLDL: 29.6 mg/dL (ref 0.0–40.0)

## 2013-08-31 DIAGNOSIS — E119 Type 2 diabetes mellitus without complications: Secondary | ICD-10-CM | POA: Diagnosis not present

## 2013-09-01 ENCOUNTER — Encounter: Payer: Self-pay | Admitting: *Deleted

## 2013-09-12 ENCOUNTER — Other Ambulatory Visit: Payer: Self-pay | Admitting: Family Medicine

## 2013-09-16 ENCOUNTER — Ambulatory Visit: Payer: Self-pay | Admitting: Oncology

## 2013-09-22 ENCOUNTER — Other Ambulatory Visit: Payer: Self-pay | Admitting: Family Medicine

## 2013-10-31 DIAGNOSIS — Z8589 Personal history of malignant neoplasm of other organs and systems: Secondary | ICD-10-CM | POA: Diagnosis not present

## 2013-10-31 DIAGNOSIS — J0110 Acute frontal sinusitis, unspecified: Secondary | ICD-10-CM | POA: Diagnosis not present

## 2013-10-31 DIAGNOSIS — J328 Other chronic sinusitis: Secondary | ICD-10-CM | POA: Diagnosis not present

## 2013-11-18 ENCOUNTER — Other Ambulatory Visit: Payer: Self-pay | Admitting: Family Medicine

## 2013-11-20 NOTE — Telephone Encounter (Signed)
Electronic refill request, no recent/future appt., please advise  

## 2013-11-20 NOTE — Telephone Encounter (Signed)
Please schedule f/u in Jan or Feb and refill until then

## 2013-11-21 NOTE — Telephone Encounter (Signed)
Left voicemail requesting pt to call office 

## 2013-11-22 NOTE — Telephone Encounter (Signed)
Med refilled.

## 2013-11-22 NOTE — Telephone Encounter (Signed)
Scheduled f/u 02/21/13

## 2013-12-07 DIAGNOSIS — F411 Generalized anxiety disorder: Secondary | ICD-10-CM | POA: Diagnosis not present

## 2013-12-18 ENCOUNTER — Other Ambulatory Visit: Payer: Self-pay | Admitting: Family Medicine

## 2014-01-18 ENCOUNTER — Other Ambulatory Visit: Payer: Self-pay | Admitting: Family Medicine

## 2014-01-19 ENCOUNTER — Ambulatory Visit: Payer: Medicare Other

## 2014-02-18 ENCOUNTER — Other Ambulatory Visit: Payer: Self-pay | Admitting: Family Medicine

## 2014-02-21 ENCOUNTER — Encounter: Payer: Self-pay | Admitting: Family Medicine

## 2014-02-21 ENCOUNTER — Ambulatory Visit (INDEPENDENT_AMBULATORY_CARE_PROVIDER_SITE_OTHER): Payer: Medicare Other | Admitting: Family Medicine

## 2014-02-21 VITALS — BP 136/64 | HR 78 | Temp 98.2°F | Ht 65.75 in | Wt 228.5 lb

## 2014-02-21 DIAGNOSIS — D509 Iron deficiency anemia, unspecified: Secondary | ICD-10-CM | POA: Diagnosis not present

## 2014-02-21 DIAGNOSIS — F329 Major depressive disorder, single episode, unspecified: Secondary | ICD-10-CM

## 2014-02-21 DIAGNOSIS — G2581 Restless legs syndrome: Secondary | ICD-10-CM | POA: Insufficient documentation

## 2014-02-21 DIAGNOSIS — E785 Hyperlipidemia, unspecified: Secondary | ICD-10-CM | POA: Diagnosis not present

## 2014-02-21 DIAGNOSIS — F32A Depression, unspecified: Secondary | ICD-10-CM

## 2014-02-21 MED ORDER — OMEPRAZOLE 20 MG PO CPDR: 20.0000 mg | DELAYED_RELEASE_CAPSULE | Freq: Every day | ORAL | Status: DC

## 2014-02-21 MED ORDER — ATORVASTATIN CALCIUM 10 MG PO TABS: 10.0000 mg | ORAL_TABLET | Freq: Every day | ORAL | Status: DC

## 2014-02-21 MED ORDER — LISINOPRIL 5 MG PO TABS: 5.0000 mg | ORAL_TABLET | Freq: Every day | ORAL | Status: DC

## 2014-02-21 MED ORDER — ROPINIROLE HCL 1 MG PO TABS: ORAL_TABLET | ORAL | Status: DC

## 2014-02-21 NOTE — Patient Instructions (Signed)
Go up on requip to 3 mg at night  Talk to your oncologist about anemia -which can worsen your symptoms of restless leg  Follow up in august 2016 for annual exam with labs prior   Work on healthy diet and exercise and weight loss

## 2014-02-21 NOTE — Assessment & Plan Note (Signed)
Diet and lipitor Disc goals for lipids and reasons to control them Rev labs with pt Rev low sat fat diet in detail Will re check in summer at [facility]

## 2014-02-21 NOTE — Assessment & Plan Note (Signed)
Worse lately- anemia may be to blame Will f/u heme/onc and continue iron  Inc pm requip to 3 mg as tolerated Update

## 2014-02-21 NOTE — Progress Notes (Signed)
Pre visit review using our clinic review tool, if applicable. No additional management support is needed unless otherwise documented below in the visit note. 

## 2014-02-21 NOTE — Progress Notes (Signed)
Subjective:    Patient ID: Suzanne Strong, female    DOB: [birth year], 69 y.o.   MRN: [id number]  HPI Here for f/u of chronic health problems   More problems with her legs at night  Is on requip for RLS  Takes 1 mg day if needed and 2 at night   She gained some wt 6 lb -and needs to loose  She has a diet plan she likes - will get back to it as a lifestyle change  Needs more exercise -walks , needs to do more   Lab Results  Component Value Date   WBC 4.5 05/25/2012   HGB 11.8* 05/25/2012   HCT 36.4 05/25/2012   MCV 80.6 05/25/2012   PLT 319.0 05/25/2012    Is anemic - was told by oncol at her last visit  Takes 325 mg ferrous sulfate daily   Hyperlipidemia  lipitor and diet Lab Results  Component Value Date   CHOL 178 08/30/2013   HDL 46.40 08/30/2013   LDLCALC 102* 08/30/2013   LDLDIRECT 116.1 05/25/2012   TRIG 148.0 08/30/2013   CHOLHDL 4 08/30/2013   staying in good control  No problems with lipitor   Sees Dr Gabriel Carina for DM  Brought her A1C down to 5.7 (per pt)- also with wt loss and janumet  Very pleased - and sees her next week     Chemistry      Component Value Date/Time   NA 137 08/30/2013 0944   K 4.4 08/30/2013 0944   CL 104 08/30/2013 0944   CO2 31 08/30/2013 0944   BUN 14 08/30/2013 0944   CREATININE 0.8 08/30/2013 0944      Component Value Date/Time   CALCIUM 9.0 08/30/2013 0944   ALKPHOS 73 08/30/2013 0944   AST 22 08/30/2013 0944   ALT 21 08/30/2013 0944   BILITOT 0.4 08/30/2013 0944      Takes xanax for anx  Rarely needs it   1-2 per mo  remeron and zoloft really help   Acid reflux has been ok -well controlled with omeprazole and diet control  Patient Active Problem List   Diagnosis Date Noted  . RLS (restless legs syndrome) 02/21/2014  . Chronic neck pain 07/25/2010  . Diabetes type 2, uncontrolled 07/07/2010  . Depression 07/07/2010  . Routine general medical examination at a health care facility 06/26/2010  . VITAMIN B12  DEFICIENCY 09/04/2009  . Anemia, iron deficiency 08/29/2009  . ANXIETY 08/26/2009  . GASTROPARESIS 08/26/2009  . UNSPECIFIED VITAMIN D DEFICIENCY 07/19/2008  . OSTEOPENIA 07/19/2007  . MALIGNANT NEOPLASM OF TONSIL 07/18/2007  . Hyperlipidemia 07/18/2007  . EDEMA 07/18/2007   Past Medical History  Diagnosis Date  . Hyperlipidemia   . Tonsillar cancer   . Diverticulosis   . Shingles     Hx of  . Gastroparesis     related to previous radiation tx  . Anxiety   . Anemia     iron deficiency and B12 deficiency  . Cervical dysplasia     conization   Past Surgical History  Procedure Laterality Date  . Breast surgery      breast biopsy benign  . Appendectomy    . Tonsillectomy      cancer treated with chemo and radiation  . Cholecystectomy    . Mole removal    . Esophagogastroduodenoscopy  07/2009    normal with few gastric polyps   History  Substance Use Topics  . Smoking status: Former Research scientist (life sciences)  .  Smokeless tobacco: Never Used  . Alcohol Use: No   Family History  Problem Relation Age of Onset  . Osteoporosis Mother   . Hyperlipidemia Mother   . Depression Mother   . Cancer Mother     skin cancer ? basal cell and lung ca heavy smoker  . Alcohol abuse Father   . Cancer Father     skin CA ? basal cell  . Heart disease Father     CHF  . Hyperlipidemia Brother   . Hypertension Brother    Allergies  Allergen Reactions  . Amoxicillin     REACTION: rash  . Fluconazole     REACTION: hives  . Gabapentin     depression   Current Outpatient Prescriptions on File Prior to Visit  Medication Sig Dispense Refill  . ALPRAZolam (XANAX) 0.5 MG tablet Take 1 tablet (0.5 mg total) by mouth at bedtime as needed for sleep. 30 tablet 3  . b complex vitamins tablet Take 1 tablet by mouth daily.      . chlorpheniramine-HYDROcodone (TUSSIONEX PENNKINETIC ER) 10-8 MG/5ML LQCR Take 5 mLs by mouth every 12 (twelve) hours as needed. 140 mL 0  . cyanocobalamin (,VITAMIN B-12,) 1000  MCG/ML injection Inject 1,000 mcg into the muscle every 30 (thirty) days.      . ferrous sulfate 325 (65 FE) MG tablet Take 325 mg by mouth daily with breakfast.      . glipiZIDE (GLUCOTROL XL) 10 MG 24 hr tablet take 1 tablet by mouth once daily 30 tablet 0  . glucose blood (ONE TOUCH TEST STRIPS) test strip Use as instructed to test blood sugar once daily or as directed 100 each 3  . JANUMET XR [phone] MG TB24 Take 1 tablet by mouth daily.    . mirtazapine (REMERON) 15 MG tablet Take 45 mg by mouth at bedtime.     Glory Rosebush DELICA LANCETS FINE MISC 1 Device by Does not apply route as directed. Use to test blood sugar once daily or as directed 100 each 3  . Polyethylene Glycol 3350 (MIRALAX PO) Take by mouth as needed.    . Probiotic Product (ALIGN PO) Take 1 tablet by mouth daily.    . sertraline (ZOLOFT) 50 MG tablet Take 50 mg by mouth daily.    . traMADol (ULTRAM) 50 MG tablet Take 1 tablet by mouth as needed.    . fluticasone (FLONASE) 50 MCG/ACT nasal spray instill 2 sprays into each nostril once daily 16 g 5   No current facility-administered medications on file prior to visit.     Review of Systems Review of Systems  Constitutional: Negative for fever, appetite change,  and unexpected weight change.  Eyes: Negative for pain and visual disturbance.  Respiratory: Negative for cough and shortness of breath.   Cardiovascular: Negative for cp or palpitations    Gastrointestinal: Negative for nausea, diarrhea and constipation.  Genitourinary: Negative for urgency and frequency.  Skin: Negative for pallor or rash   Neurological: Negative for weakness, light-headedness, numbness and headaches.  Hematological: Negative for adenopathy. Does not bruise/bleed easily.  Psychiatric/Behavioral: Negative for dysphoric mood. The patient is not nervous/anxious.         Objective:   Physical Exam  Constitutional: She appears well-developed and well-nourished. No distress.  obese and well  appearing   HENT:  Head: Normocephalic and atraumatic.  Mouth/Throat: Oropharynx is clear and moist.  Eyes: Conjunctivae and EOM are normal. Pupils are equal, round, and reactive to light. Right  eye exhibits no discharge. Left eye exhibits no discharge. No scleral icterus.  Neck: Normal range of motion. Neck supple. Carotid bruit is not present.  Baseline neck surgical change   Cardiovascular: Normal rate, regular rhythm, normal heart sounds and intact distal pulses.  Exam reveals no gallop.   Pulmonary/Chest: Effort normal and breath sounds normal. No respiratory distress. She has no wheezes. She has no rales.  Musculoskeletal: She exhibits no edema or tenderness.  Lymphadenopathy:    She has no cervical adenopathy.  Neurological: She is alert. She has normal reflexes. She displays no atrophy and no tremor. No cranial nerve deficit. She exhibits normal muscle tone. Coordination and gait normal.  Skin: Skin is warm and dry. No rash noted. No erythema. No pallor.  Psychiatric: She has a normal mood and affect.          Assessment & Plan:   Problem List Items Addressed This Visit      Other   Anemia, iron deficiency - Primary    Followed by oncology Pt takes 325 mg ferrous sulfate daily  Will continue this -and has f/u soon  ? If this makes her RLS worse    Depression    Mood is improved Rarely needs xanax now on mirtazapine-helps with sleep    Hyperlipidemia    Diet and lipitor Disc goals for lipids and reasons to control them Rev labs with pt Rev low sat fat diet in detail Will re check in summer at [facility]    Relevant Medications      atorvastatin (LIPITOR) tablet      lisinopril (PRINIVIL,ZESTRIL) tablet   RLS (restless legs syndrome)    Worse lately- anemia may be to blame Will f/u heme/onc and continue iron  Inc pm requip to 3 mg as tolerated Update

## 2014-02-21 NOTE — Assessment & Plan Note (Signed)
Followed by oncology Pt takes 325 mg ferrous sulfate daily  Will continue this -and has f/u soon  ? If this makes her RLS worse

## 2014-02-22 NOTE — Assessment & Plan Note (Signed)
Mood is improved Rarely needs xanax now on mirtazapine-helps with sleep

## 2014-02-26 DIAGNOSIS — E119 Type 2 diabetes mellitus without complications: Secondary | ICD-10-CM | POA: Diagnosis not present

## 2014-02-28 DIAGNOSIS — F411 Generalized anxiety disorder: Secondary | ICD-10-CM | POA: Diagnosis not present

## 2014-03-01 ENCOUNTER — Ambulatory Visit: Payer: Self-pay | Admitting: Oncology

## 2014-03-01 DIAGNOSIS — Z79899 Other long term (current) drug therapy: Secondary | ICD-10-CM | POA: Diagnosis not present

## 2014-03-01 DIAGNOSIS — Z87891 Personal history of nicotine dependence: Secondary | ICD-10-CM | POA: Diagnosis not present

## 2014-03-01 DIAGNOSIS — E780 Pure hypercholesterolemia: Secondary | ICD-10-CM | POA: Diagnosis not present

## 2014-03-01 DIAGNOSIS — Z9221 Personal history of antineoplastic chemotherapy: Secondary | ICD-10-CM | POA: Diagnosis not present

## 2014-03-01 DIAGNOSIS — Z85818 Personal history of malignant neoplasm of other sites of lip, oral cavity, and pharynx: Secondary | ICD-10-CM | POA: Diagnosis not present

## 2014-03-01 DIAGNOSIS — Z923 Personal history of irradiation: Secondary | ICD-10-CM | POA: Diagnosis not present

## 2014-03-01 LAB — CBC CANCER CENTER
BASOS PCT: 0.7 %
Basophil #: 0 x10 3/mm (ref 0.0–0.1)
EOS PCT: 2.4 %
Eosinophil #: 0.1 x10 3/mm (ref 0.0–0.7)
HCT: 36.4 % (ref 35.0–47.0)
HGB: 11.5 g/dL — ABNORMAL LOW (ref 12.0–16.0)
LYMPHS ABS: 1 x10 3/mm (ref 1.0–3.6)
Lymphocyte %: 17.9 %
MCH: 25.5 pg — ABNORMAL LOW (ref 26.0–34.0)
MCHC: 31.5 g/dL — ABNORMAL LOW (ref 32.0–36.0)
MCV: 81 fL (ref 80–100)
MONOS PCT: 5.1 %
Monocyte #: 0.3 x10 3/mm (ref 0.2–0.9)
Neutrophil #: 3.9 x10 3/mm (ref 1.4–6.5)
Neutrophil %: 73.9 %
PLATELETS: 254 x10 3/mm (ref 150–440)
RBC: 4.5 10*6/uL (ref 3.80–5.20)
RDW: 15.6 % — ABNORMAL HIGH (ref 11.5–14.5)
WBC: 5.3 x10 3/mm (ref 3.6–11.0)

## 2014-03-01 LAB — COMPREHENSIVE METABOLIC PANEL
ALK PHOS: 122 U/L — AB
AST: 20 U/L (ref 15–37)
Albumin: 3.4 g/dL (ref 3.4–5.0)
Anion Gap: 12 (ref 7–16)
BUN: 12 mg/dL (ref 7–18)
Bilirubin,Total: 0.2 mg/dL (ref 0.2–1.0)
CHLORIDE: 102 mmol/L (ref 98–107)
CO2: 28 mmol/L (ref 21–32)
CREATININE: 0.95 mg/dL (ref 0.60–1.30)
Calcium, Total: 8.6 mg/dL (ref 8.5–10.1)
Glucose: 219 mg/dL — ABNORMAL HIGH (ref 65–99)
Osmolality: 290 (ref 275–301)
Potassium: 4.3 mmol/L (ref 3.5–5.1)
SGPT (ALT): 28 U/L
Sodium: 142 mmol/L (ref 136–145)
Total Protein: 7.5 g/dL (ref 6.4–8.2)

## 2014-03-01 LAB — TSH: Thyroid Stimulating Horm: 2.21 u[IU]/mL

## 2014-03-06 ENCOUNTER — Other Ambulatory Visit: Payer: Self-pay | Admitting: Family Medicine

## 2014-03-19 ENCOUNTER — Ambulatory Visit: Payer: Self-pay | Admitting: Oncology

## 2014-03-19 LAB — HM MAMMOGRAPHY: HM Mammogram: NORMAL

## 2014-03-28 DIAGNOSIS — E119 Type 2 diabetes mellitus without complications: Secondary | ICD-10-CM | POA: Insufficient documentation

## 2014-05-23 DIAGNOSIS — F411 Generalized anxiety disorder: Secondary | ICD-10-CM | POA: Diagnosis not present

## 2014-06-09 NOTE — Op Note (Signed)
PATIENT NAME:  Suzanne Strong, Suzanne Strong MR#:  [id number] DATE OF BIRTH:  11-06-1945  DATE OF PROCEDURE:  04/13/2013  SURGEON:  Janalee Dane, M.D.  PREOPERATIVE DIAGNOSIS:  Chronic sinusitis (status post sinus surgery many years ago).  POSTOPERATIVE DIAGNOSIS:  Chronic sinusitis (status post sinus surgery many years ago).   PROCEDURES: 1.  Image-guided sinus surgery (Stryker navigation).  2.  Revision bilateral anterior and posterior ethmoidectomies.  3.  Revision bilateral frontal sinusotomies with tissue removal.   ANESTHESIA:  General endotracheal.   DESCRIPTION OF PROCEDURE:  The image-guided sinus surgery system mask was attached and registration was carried out in the standard fashion using the appropriate fiduciary points. Calibration of the system was confirmed and extensive review of the CT scan in all three dimensions preoperatively and intraoperatively was carried out.  Each instrument was registered and confirmed for anatomic accuracy.  The left side was addressed first.  The ethmoid cells were meticulously taken down using the image guidance for confirmation to avoid damage to the skull base and the lamina papyracea.  Once the ethmoid ledges and residual polypoid inflammation had been removed, the frontal recess was still very contracted and scarred, therefore using a 45 degree scope the frontal recess was progressively enlarged to create as maximal a frontal sinusotomy as possible.  Copious irrigation with saline was used throughout the procedure.  Temporarily a pledget soaked with phenylephrine and lidocaine was placed while attention was directed to the right side, again taking care to preserve the skull base and lamina papyracea.  The residual ledges and ethmoid cells were removed along with polypoid degeneration and osteitic bone and once again the frontal recess was quite contracted, therefore this was also enlarged on the right side.  Photographs were taken after frontal  sinusotomies to direct postoperative debridement.  At this point, the pledget was removed from the left side and again copious irrigation on both sides, approximately 1 unit of Surgiflo was placed.  Temporary Telfa pledgets were placed, sutured over the columella.  The patient was returned to anesthesia, allowed to emerge from anesthesia in the Operating Room and taken to the recovery room in stable condition.  There were no complications.  Estimated blood loss 20 mL.    ____________________________ J. Nadeen Landau, MD jmc:ea D: 04/13/2013 16:02:40 ET T: 04/14/2013 03:02:54 ET JOB#: [id number]  cc: Janalee Dane, MD, <Dictator> Nicholos Johns MD ELECTRONICALLY SIGNED 04/17/2013 20:47

## 2014-06-25 DIAGNOSIS — E119 Type 2 diabetes mellitus without complications: Secondary | ICD-10-CM | POA: Diagnosis not present

## 2014-07-02 DIAGNOSIS — E119 Type 2 diabetes mellitus without complications: Secondary | ICD-10-CM | POA: Diagnosis not present

## 2014-08-14 DIAGNOSIS — F411 Generalized anxiety disorder: Secondary | ICD-10-CM | POA: Diagnosis not present

## 2014-08-17 LAB — HM DIABETES EYE EXAM

## 2014-08-20 DIAGNOSIS — E11329 Type 2 diabetes mellitus with mild nonproliferative diabetic retinopathy without macular edema: Secondary | ICD-10-CM | POA: Diagnosis not present

## 2014-08-26 ENCOUNTER — Other Ambulatory Visit: Payer: Self-pay | Admitting: Family Medicine

## 2014-09-08 ENCOUNTER — Other Ambulatory Visit: Payer: Self-pay | Admitting: Family Medicine

## 2014-09-12 ENCOUNTER — Telehealth: Payer: Self-pay | Admitting: Family Medicine

## 2014-09-12 DIAGNOSIS — E538 Deficiency of other specified B group vitamins: Secondary | ICD-10-CM

## 2014-09-12 DIAGNOSIS — E785 Hyperlipidemia, unspecified: Secondary | ICD-10-CM

## 2014-09-12 DIAGNOSIS — E1165 Type 2 diabetes mellitus with hyperglycemia: Secondary | ICD-10-CM

## 2014-09-12 DIAGNOSIS — D509 Iron deficiency anemia, unspecified: Secondary | ICD-10-CM

## 2014-09-12 DIAGNOSIS — IMO0002 Reserved for concepts with insufficient information to code with codable children: Secondary | ICD-10-CM

## 2014-09-12 DIAGNOSIS — E559 Vitamin D deficiency, unspecified: Secondary | ICD-10-CM

## 2014-09-12 NOTE — Telephone Encounter (Signed)
Future lab

## 2014-09-18 ENCOUNTER — Other Ambulatory Visit (INDEPENDENT_AMBULATORY_CARE_PROVIDER_SITE_OTHER): Payer: Medicare Other

## 2014-09-18 DIAGNOSIS — E538 Deficiency of other specified B group vitamins: Secondary | ICD-10-CM

## 2014-09-18 DIAGNOSIS — E785 Hyperlipidemia, unspecified: Secondary | ICD-10-CM | POA: Diagnosis not present

## 2014-09-18 DIAGNOSIS — D509 Iron deficiency anemia, unspecified: Secondary | ICD-10-CM | POA: Diagnosis not present

## 2014-09-18 DIAGNOSIS — E559 Vitamin D deficiency, unspecified: Secondary | ICD-10-CM

## 2014-09-18 DIAGNOSIS — E1165 Type 2 diabetes mellitus with hyperglycemia: Secondary | ICD-10-CM | POA: Diagnosis not present

## 2014-09-18 DIAGNOSIS — IMO0002 Reserved for concepts with insufficient information to code with codable children: Secondary | ICD-10-CM

## 2014-09-18 LAB — COMPREHENSIVE METABOLIC PANEL
ALT: 20 U/L (ref 0–35)
AST: 22 U/L (ref 0–37)
Albumin: 3.6 g/dL (ref 3.5–5.2)
Alkaline Phosphatase: 84 U/L (ref 39–117)
BILIRUBIN TOTAL: 0.2 mg/dL (ref 0.2–1.2)
BUN: 9 mg/dL (ref 6–23)
CO2: 30 meq/L (ref 19–32)
Calcium: 9.2 mg/dL (ref 8.4–10.5)
Chloride: 106 mEq/L (ref 96–112)
Creatinine, Ser: 0.72 mg/dL (ref 0.40–1.20)
GFR: 85.93 mL/min (ref >60.00)
GLUCOSE: 101 mg/dL — AB (ref 70–99)
Potassium: 4.3 mEq/L (ref 3.5–5.1)
Sodium: 140 mEq/L (ref 135–145)
TOTAL PROTEIN: 6.9 g/dL (ref 6.0–8.3)

## 2014-09-18 LAB — LIPID PANEL
Cholesterol: 173 mg/dL (ref 0–200)
HDL: 50.4 mg/dL (ref >39.00)
LDL CALC: 94 mg/dL (ref 0–99)
NONHDL: 123.08
TRIGLYCERIDES: 144 mg/dL (ref 0.0–149.0)
Total CHOL/HDL Ratio: 3
VLDL: 28.8 mg/dL (ref 0.0–40.0)

## 2014-09-18 LAB — TSH: TSH: 2.06 u[IU]/mL (ref 0.35–4.50)

## 2014-09-18 LAB — HEMOGLOBIN A1C: HEMOGLOBIN A1C: 7 % — AB (ref 4.6–6.5)

## 2014-09-18 LAB — CBC WITH DIFFERENTIAL/PLATELET
BASOS PCT: 0.5 % (ref 0.0–3.0)
Basophils Absolute: 0 10*3/uL (ref 0.0–0.1)
Eosinophils Absolute: 0.4 10*3/uL (ref 0.0–0.7)
Eosinophils Relative: 6.9 % — ABNORMAL HIGH (ref 0.0–5.0)
HCT: 33.7 % — ABNORMAL LOW (ref 36.0–46.0)
HEMOGLOBIN: 10.8 g/dL — AB (ref 12.0–15.0)
Lymphocytes Relative: 21.3 % (ref 12.0–46.0)
Lymphs Abs: 1.1 10*3/uL (ref 0.7–4.0)
MCHC: 32.1 g/dL (ref 30.0–36.0)
MCV: 80.8 fl (ref 78.0–100.0)
MONOS PCT: 6.5 % (ref 3.0–12.0)
Monocytes Absolute: 0.3 10*3/uL (ref 0.1–1.0)
NEUTROS ABS: 3.4 10*3/uL (ref 1.4–7.7)
Neutrophils Relative %: 64.8 % (ref 43.0–77.0)
PLATELETS: 274 10*3/uL (ref 150.0–400.0)
RBC: 4.17 Mil/uL (ref 3.87–5.11)
RDW: 16.8 % — ABNORMAL HIGH (ref 11.5–15.5)
WBC: 5.2 10*3/uL (ref 4.0–10.5)

## 2014-09-18 LAB — VITAMIN B12: Vitamin B-12: 390 pg/mL (ref 211–911)

## 2014-09-18 LAB — VITAMIN D 25 HYDROXY (VIT D DEFICIENCY, FRACTURES): VITD: 24.01 ng/mL — ABNORMAL LOW (ref 30.00–100.00)

## 2014-09-25 ENCOUNTER — Encounter: Payer: Medicare Other | Admitting: Family Medicine

## 2014-09-28 DIAGNOSIS — E119 Type 2 diabetes mellitus without complications: Secondary | ICD-10-CM | POA: Diagnosis not present

## 2014-10-02 DIAGNOSIS — E119 Type 2 diabetes mellitus without complications: Secondary | ICD-10-CM | POA: Diagnosis not present

## 2014-10-03 ENCOUNTER — Encounter: Payer: Medicare Other | Admitting: Family Medicine

## 2014-10-16 ENCOUNTER — Ambulatory Visit (INDEPENDENT_AMBULATORY_CARE_PROVIDER_SITE_OTHER): Payer: Medicare Other | Admitting: Family Medicine

## 2014-10-16 ENCOUNTER — Encounter: Payer: Self-pay | Admitting: Family Medicine

## 2014-10-16 VITALS — BP 118/66 | HR 81 | Temp 98.4°F | Ht 65.5 in | Wt 221.2 lb

## 2014-10-16 DIAGNOSIS — Z23 Encounter for immunization: Secondary | ICD-10-CM | POA: Diagnosis not present

## 2014-10-16 DIAGNOSIS — E785 Hyperlipidemia, unspecified: Secondary | ICD-10-CM

## 2014-10-16 DIAGNOSIS — M899 Disorder of bone, unspecified: Secondary | ICD-10-CM

## 2014-10-16 DIAGNOSIS — Z0000 Encounter for general adult medical examination without abnormal findings: Secondary | ICD-10-CM

## 2014-10-16 DIAGNOSIS — E538 Deficiency of other specified B group vitamins: Secondary | ICD-10-CM

## 2014-10-16 DIAGNOSIS — Z1211 Encounter for screening for malignant neoplasm of colon: Secondary | ICD-10-CM | POA: Insufficient documentation

## 2014-10-16 DIAGNOSIS — M949 Disorder of cartilage, unspecified: Secondary | ICD-10-CM

## 2014-10-16 DIAGNOSIS — D509 Iron deficiency anemia, unspecified: Secondary | ICD-10-CM

## 2014-10-16 DIAGNOSIS — E2839 Other primary ovarian failure: Secondary | ICD-10-CM

## 2014-10-16 DIAGNOSIS — E559 Vitamin D deficiency, unspecified: Secondary | ICD-10-CM

## 2014-10-16 NOTE — Assessment & Plan Note (Signed)
IFOB kit given  colonosc 2011 with 10 year recall

## 2014-10-16 NOTE — Assessment & Plan Note (Signed)
Lab Results  Component Value Date   [id number] 09/18/2014   Hb is low however  Will check in with her oncologist re: anemia

## 2014-10-16 NOTE — Progress Notes (Signed)
Pre visit review using our clinic review tool, if applicable. No additional management support is needed unless otherwise documented below in the visit note. 

## 2014-10-16 NOTE — Assessment & Plan Note (Signed)
Reviewed appropriate screening tests for age  Also reviewed health mt list, fam hx and immunization status , as well as social and family history   See HPI Labs reviewed prevnar vaccine today Add extra 2000 iu of vitamin D 3 daily to what you are already taking  Stop at check out for referral for a bone density test Don't forget to work on a living will- see the materials I gave you  Please do IFOB stool kit - you are more anemic - also get in touch with your oncologist about the anemia  Your mammogram is due in Oct - you can schedule that

## 2014-10-16 NOTE — Assessment & Plan Note (Signed)
Disc goals for lipids and reasons to control them Rev labs with pt Rev low sat fat diet in detail Enc to continue low fat diet and atorvastatin

## 2014-10-16 NOTE — Assessment & Plan Note (Signed)
Reviewed health habits including diet and exercise and skin cancer prevention Reviewed appropriate screening tests for age  Also reviewed health mt list, fam hx and immunization status , as well as social and family history   See HPI Labs reviewed prevnar vaccine today Add extra 2000 iu of vitamin D 3 daily to what you are already taking  Stop at check out for referral for a bone density test Don't forget to work on a living will- see the materials I gave you  Please do IFOB stool kit - you are more anemic - also get in touch with your oncologist about the anemia  Your mammogram is due in Oct - you can schedule that

## 2014-10-16 NOTE — Assessment & Plan Note (Signed)
Pt was taking ca with D but no extra D  Level in the 20s Enc to add 2000 iu D3 daily to what she is already taking  Disc imp to bone and overall health

## 2014-10-16 NOTE — Progress Notes (Signed)
Subjective:    Patient ID: Suzanne Strong, female    DOB: 1946-01-13, 69 y.o.   MRN: [id number]  HPI Here for annual medicare wellness visit as well as chronic/acute medical problems and also annual preventative exam   I have personally reviewed the Medicare Annual Wellness questionnaire and have noted 1. The patient's medical and social history 2. Their use of alcohol, tobacco or illicit drugs 3. Their current medications and supplements 4. The patient's functional ability including ADL's, fall risks, home safety risks and hearing or visual             impairment. 5. Diet and physical activities 6. Evidence for depression or mood disorders  The patients weight, height, BMI have been recorded in the chart and visual acuity is per eye clinic.  I have made referrals, counseling and provided education to the patient based review of the above and I have provided the pt with a written personalized care plan for preventive services. Reviewed and updated provider list, see scanned forms.  Doing ok  Taking care of herself  Will get released by oncologist this year (10 y)-doing well   Her psychiatrist wants her to have a sleep study  On remeron for sleep  Does not snore Not tired all the time  Has several days each month when she does not sleep well  Has good sleep hygiene   She is not really concerned    See scanned forms.  Routine anticipatory guidance given to patient.  See health maintenance. 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 - no problems with memory / nothing concerning   PMH and SH reviewed  Meds, vitals, and allergies reviewed.   ROS: See HPI.  Otherwise negative.    D def  Level is 56  Sees endocrinology for DM- just saw Dr Gabriel Carina - is improving slowly - up and down (good this time)  B12 def Lab Results  Component Value Date   VITAMINB12 390 09/18/2014    Last eye exam 7/16 - no retinopathy  Lipids Lab Results  Component Value Date   CHOL 173 09/18/2014   CHOL 178 08/30/2013   CHOL 178 09/14/2012   Lab Results  Component Value Date   HDL 50.40 09/18/2014   HDL 46.40 08/30/2013   HDL 39.00* 09/14/2012   Lab Results  Component Value Date   LDLCALC 94 09/18/2014   LDLCALC 102* 08/30/2013   LDLCALC 110* 09/14/2012   Lab Results  Component Value Date   TRIG 144.0 09/18/2014   TRIG 148.0 08/30/2013   TRIG 146.0 09/14/2012   Lab Results  Component Value Date   CHOLHDL 3 09/18/2014   CHOLHDL 4 08/30/2013   CHOLHDL 5 09/14/2012   Lab Results  Component Value Date   LDLDIRECT 116.1 05/25/2012   LDLDIRECT 114.0 06/30/2010  improved  Taking atorvastatin and diet  Working on low fat diet   Taking iron  Lab Results  Component Value Date   WBC 5.2 09/18/2014   HGB 10.8* 09/18/2014   HCT 33.7* 09/18/2014   MCV 80.8 09/18/2014   PLT 274.0 09/18/2014    Usually hb is in 11 range  Oncology has followed that - but never really had a work up  Has tried to donate blood and too anemic  No surgery  No abd pain or blood in stool  No hx of need for IV iron   Results for orders placed or performed in visit on 09/18/14  CBC with Differential/Platelet  Result Value Ref Range   WBC 5.2 4.0 - 10.5 K/uL   RBC 4.17 3.87 - 5.11 Mil/uL   Hemoglobin 10.8 (L) 12.0 - 15.0 g/dL   HCT 33.7 (L) 36.0 - 46.0 %   MCV 80.8 78.0 - 100.0 fl   MCHC 32.1  30.0 - 36.0 g/dL   RDW 16.8 (H) 11.5 - 15.5 %   Platelets 274.0 150.0 - 400.0 K/uL   Neutrophils Relative % 64.8 43.0 - 77.0 %   Lymphocytes Relative 21.3 12.0 - 46.0 %   Monocytes Relative 6.5 3.0 - 12.0 %   Eosinophils Relative 6.9 (H) 0.0 - 5.0 %   Basophils Relative 0.5 0.0 - 3.0 %   Neutro Abs 3.4 1.4 - 7.7 K/uL   Lymphs Abs 1.1 0.7 - 4.0 K/uL   Monocytes Absolute 0.3 0.1 - 1.0 K/uL   Eosinophils Absolute 0.4 0.0 - 0.7 K/uL   Basophils Absolute 0.0 0.0 - 0.1 K/uL  Comprehensive metabolic panel  Result Value Ref Range   Sodium 140 135 - 145 mEq/L   Potassium 4.3 3.5 - 5.1 mEq/L   Chloride 106 96 - 112 mEq/L   CO2 30 19 - 32 mEq/L   Glucose, Bld 101 (H) 70 - 99 mg/dL   BUN 9 6 - 23 mg/dL   Creatinine, Ser 0.72 0.40 - 1.20 mg/dL   Total Bilirubin 0.2 0.2 - 1.2 mg/dL   Alkaline Phosphatase 84 39 - 117 U/L   AST 22 0 - 37 U/L   ALT 20 0 - 35 U/L   Total Protein 6.9 6.0 - 8.3 g/dL   Albumin 3.6 3.5 - 5.2 g/dL   Calcium 9.2 8.4 - 10.5 mg/dL   GFR 85.93 >60.00 mL/min  Hemoglobin A1c  Result Value Ref Range   Hgb A1c MFr Bld 7.0 (H) 4.6 - 6.5 %  Lipid panel  Result Value Ref Range   Cholesterol 173 0 - 200 mg/dL   Triglycerides 144.0 0.0 - 149.0 mg/dL   HDL 50.40 >39.00 mg/dL   VLDL 28.8 0.0 - 40.0 mg/dL   LDL Cholesterol 94 0 - 99 mg/dL   Total CHOL/HDL Ratio 3    NonHDL 123.08   TSH  Result Value Ref Range   TSH 2.06 0.35 - 4.50 uIU/mL  Vitamin B12  Result Value Ref Range   Vitamin B-12 390 211 - 911 pg/mL  Vit D  25 hydroxy (rtn osteoporosis monitoring)  Result Value Ref Range   VITD 24.01 (L) 30.00 - 100.00 ng/mL     Patient Active Problem List   Diagnosis Date Noted  . Initial Medicare annual wellness visit 10/16/2014  . RLS (restless legs syndrome) 02/21/2014  . Chronic neck pain 07/25/2010  . Diabetes type 2, uncontrolled 07/07/2010  . Depression 07/07/2010  . Routine general medical examination at a health  care facility 06/26/2010  . B12 deficiency  09/04/2009  . Anemia, iron deficiency 08/29/2009  . ANXIETY 08/26/2009  . GASTROPARESIS 08/26/2009  . Vitamin D deficiency 07/19/2008  . Disorder of bone and cartilage 07/19/2007  . MALIGNANT NEOPLASM OF TONSIL 07/18/2007  . Hyperlipidemia 07/18/2007  . EDEMA 07/18/2007   Past Medical History  Diagnosis Date  . Hyperlipidemia   . Tonsillar cancer   . Diverticulosis   . Shingles     Hx of  . Gastroparesis     related to previous radiation tx  . Anxiety   . Anemia     iron deficiency and B12 deficiency  . Cervical dysplasia     conization   Past Surgical History  Procedure Laterality Date  . Breast surgery      breast biopsy benign  . Appendectomy    . Tonsillectomy      cancer treated with chemo and radiation  . Cholecystectomy    . Mole removal    . Esophagogastroduodenoscopy  07/2009    normal with few gastric polyps   Social History  Substance Use Topics  . Smoking status: Former Research scientist (life sciences)  . Smokeless tobacco: Never Used  . Alcohol Use: No   Family History  Problem Relation Age of Onset  . Osteoporosis Mother   . Hyperlipidemia Mother   . Depression Mother   . Cancer Mother     skin cancer ? basal cell and lung ca heavy smoker  . Alcohol abuse Father   . Cancer Father     skin CA ? basal cell  . Heart disease Father     CHF  . Hyperlipidemia Brother   . Hypertension Brother    Allergies  Allergen Reactions  . Amoxicillin     REACTION: rash  . Fluconazole     REACTION: hives  . Gabapentin     depression   Current Outpatient Prescriptions on File Prior to Visit  Medication Sig Dispense Refill  . ALPRAZolam (XANAX) 0.5 MG tablet Take 1 tablet (0.5 mg total) by mouth at bedtime as needed for sleep. 30 tablet 3  . atorvastatin (LIPITOR) 10 MG tablet Take 1 tablet (10 mg total) by mouth daily. 30 tablet 11  . b complex vitamins tablet Take 1 tablet by mouth daily.      . cyanocobalamin (,VITAMIN B-12,) 1000 MCG/ML injection Inject 1,000 mcg into the  muscle every 30 (thirty) days.      . ferrous sulfate 325 (65 FE) MG tablet Take 325 mg by mouth daily with breakfast.      . fluticasone (FLONASE) 50 MCG/ACT nasal spray instill 2 sprays into each nostril once daily 16 g 1  . glipiZIDE (GLUCOTROL XL) 10 MG 24 hr tablet take 1 tablet by mouth once daily 30 tablet 0  . glucose blood (ONE TOUCH TEST STRIPS) test strip Use as instructed to test blood sugar once daily or as directed 100 each 3  . JANUMET XR [id number] MG TB24 Take 1 tablet by mouth daily.    [name] lisinopril (PRINIVIL,ZESTRIL) 5 MG tablet Take 1 tablet (5 mg total) by mouth daily. 30 tablet 11  . mirtazapine (REMERON) 15 MG tablet Take 45 mg by mouth at bedtime.     [name] omeprazole (PRILOSEC) 20 MG capsule Take 1 capsule (20 mg total) by mouth daily. *follow-up with provider required before future refills are given* 30 capsule 11  . ONETOUCH DELICA LANCETS FINE MISC 1 Device by Does not apply route as directed. Use  to test blood sugar once daily or as directed 100 each 3  . Polyethylene Glycol 3350 (MIRALAX PO) Take by mouth as needed.    . Probiotic Product (ALIGN PO) Take 1 tablet by mouth daily.    [name] rOPINIRole (REQUIP) 1 MG tablet 1 pill in the am as needed by mouth and 3 pills at bedtime by mouth 120 tablet 11  . sertraline (ZOLOFT) 50 MG tablet Take 50 mg by mouth daily.     No current facility-administered medications on file prior to visit.      Review of Systems    Review of Systems  Constitutional: Negative for fever, appetite change, fatigue and unexpected weight change. pos for trouble sleeping (taking remeron), neg for snoring  Eyes: Negative for pain and visual disturbance.  Respiratory: Negative for cough and shortness of breath.   Cardiovascular: Negative for cp or palpitations    Gastrointestinal: Negative for nausea, diarrhea and constipation.  Genitourinary: Negative for urgency and frequency.  Skin: Negative for pallor or rash   Neurological: Negative for  weakness, light-headedness, numbness and headaches.  Hematological: Negative for adenopathy. Does not bruise/bleed easily.  Psychiatric/Behavioral: Negative for dysphoric mood. The patient is not nervous/anxious.      Objective:   Physical Exam  Constitutional: She appears well-developed and well-nourished. No distress.  obese and well appearing   HENT:  Head: Normocephalic and atraumatic.  Right Ear: External ear normal.  Left Ear: External ear normal.  Mouth/Throat: Oropharynx is clear and moist.  Surgical changes noted in throat   Eyes: Conjunctivae and EOM are normal. Pupils are equal, round, and reactive to light. No scleral icterus.  Neck: Normal range of motion. Neck supple. No JVD present. Carotid bruit is not present. No thyromegaly present.  Cardiovascular: Normal rate, regular rhythm, normal heart sounds and intact distal pulses.  Exam reveals no gallop.   Pulmonary/Chest: Effort normal and breath sounds normal. No respiratory distress. She has no wheezes. She exhibits no tenderness.  Abdominal: Soft. Bowel sounds are normal. She exhibits no distension, no abdominal bruit and no mass. There is no tenderness.  Genitourinary: No breast swelling, tenderness, discharge or bleeding.  Breast exam: No mass, nodules, thickening, tenderness, bulging, retraction, inflamation, nipple discharge or skin changes noted.  No axillary or clavicular LA.      Musculoskeletal: Normal range of motion. She exhibits no edema or tenderness.  Lymphadenopathy:    She has no cervical adenopathy.  Neurological: She is alert. She has normal reflexes. No cranial nerve deficit. She exhibits normal muscle tone. Coordination normal.  Skin: Skin is warm and dry. No rash noted. No erythema. No pallor.  Psychiatric: She has a normal mood and affect.          Assessment & Plan:   Problem List Items Addressed This Visit    Anemia, iron deficiency    Worse lately and do not know why  IFOB kit given  (she  will stop iron when doing this) Pt will check in with her oncologist   May need ref to GI      B12 deficiency - Primary    Lab Results  Component Value Date   VITAMINB12 390 09/18/2014   Hb is low however  Will check in with her oncologist re: anemia       Colon cancer screening    IFOB kit given  colonosc 2011 with 10 year recall       Relevant Orders   Fecal occult blood, imunochemical  Disorder of bone and cartilage    Ref for dexa No recent falls or fx   Disc need for calcium/ vitamin D/ wt bearing exercise and bone density test every 2 y to monitor Disc safety/ fracture risk in detail        Estrogen deficiency   Relevant Orders   DG Bone Density   Hyperlipidemia    Disc goals for lipids and reasons to control them Rev labs with pt Rev low sat fat diet in detail Enc to continue low fat diet and atorvastatin        Initial Medicare annual wellness visit    Reviewed health habits including diet and exercise and skin cancer prevention Reviewed appropriate screening tests for age  Also reviewed health mt list, fam hx and immunization status , as well as social and family history   See HPI Labs reviewed prevnar vaccine today Add extra 2000 iu of vitamin D 3 daily to what you are already taking  Stop at check out for referral for a bone density test Don't forget to work on a living will- see the materials I gave you  Please do IFOB stool kit - you are more anemic - also get in touch with your oncologist about the anemia  Your mammogram is due in Oct - you can schedule that       Routine general medical examination at a health care facility    Reviewed appropriate screening tests for age  Also reviewed health mt list, fam hx and immunization status , as well as social and family history   See HPI Labs reviewed prevnar vaccine today Add extra 2000 iu of vitamin D 3 daily to what you are already taking  Stop at check out for referral for a bone density  test Don't forget to work on a living will- see the materials I gave you  Please do IFOB stool kit - you are more anemic - also get in touch with your oncologist about the anemia  Your mammogram is due in Oct - you can schedule that       Vitamin D deficiency    Pt was taking ca with D but no extra D  Level in the 20s Enc to add 2000 iu D3 daily to what she is already taking  Disc imp to bone and overall health        Other Visit Diagnoses    Need for vaccination with 13-polyvalent pneumococcal conjugate vaccine        Relevant Orders    Pneumococcal conjugate vaccine 13-valent (Completed)

## 2014-10-16 NOTE — Assessment & Plan Note (Signed)
Ref for dexa No recent falls or fx   Disc need for calcium/ vitamin D/ wt bearing exercise and bone density test every 2 y to monitor Disc safety/ fracture risk in detail

## 2014-10-16 NOTE — Patient Instructions (Signed)
If you need a referral for a sleep study (for a sleep disorder) please let me know  prevnar vaccine today Add extra 2000 iu of vitamin D 3 daily to what you are already taking  Stop at check out for referral for a bone density test Don't forget to work on a living will- see the materials I gave you  Please do IFOB stool kit - you are more anemic - also get in touch with your oncologist about the anemia  Your mammogram is due in Oct - you can schedule that

## 2014-10-16 NOTE — Assessment & Plan Note (Signed)
Worse lately and do not know why  IFOB kit given  (she will stop iron when doing this) Pt will check in with her oncologist   May need ref to GI

## 2014-10-27 ENCOUNTER — Other Ambulatory Visit: Payer: Self-pay | Admitting: Family Medicine

## 2014-10-29 ENCOUNTER — Ambulatory Visit: Payer: Medicare Other

## 2014-10-30 ENCOUNTER — Ambulatory Visit
Admission: RE | Admit: 2014-10-30 | Discharge: 2014-10-30 | Disposition: A | Discharge status: Home / Self Care | Payer: Medicare Other | Source: Ambulatory Visit | Attending: Family Medicine | Admitting: Family Medicine

## 2014-10-30 DIAGNOSIS — E2839 Other primary ovarian failure: Secondary | ICD-10-CM | POA: Insufficient documentation

## 2014-10-30 DIAGNOSIS — Z13820 Encounter for screening for osteoporosis: Secondary | ICD-10-CM | POA: Diagnosis not present

## 2014-10-30 LAB — HM DEXA SCAN: HM Dexa Scan: NORMAL

## 2014-10-31 ENCOUNTER — Encounter: Payer: Self-pay | Admitting: Family Medicine

## 2014-10-31 ENCOUNTER — Encounter: Payer: Self-pay | Admitting: *Deleted

## 2014-11-06 DIAGNOSIS — F411 Generalized anxiety disorder: Secondary | ICD-10-CM | POA: Diagnosis not present

## 2015-01-29 DIAGNOSIS — E119 Type 2 diabetes mellitus without complications: Secondary | ICD-10-CM | POA: Diagnosis not present

## 2015-01-30 DIAGNOSIS — F411 Generalized anxiety disorder: Secondary | ICD-10-CM | POA: Diagnosis not present

## 2015-02-05 DIAGNOSIS — E119 Type 2 diabetes mellitus without complications: Secondary | ICD-10-CM | POA: Diagnosis not present

## 2015-02-15 ENCOUNTER — Encounter: Payer: Self-pay | Admitting: Family Medicine

## 2015-02-15 ENCOUNTER — Ambulatory Visit (INDEPENDENT_AMBULATORY_CARE_PROVIDER_SITE_OTHER): Payer: Medicare Other | Admitting: Family Medicine

## 2015-02-15 VITALS — BP 132/70 | HR 72 | Temp 98.4°F | Ht 65.5 in | Wt 219.2 lb

## 2015-02-15 DIAGNOSIS — N63 Unspecified lump in breast: Secondary | ICD-10-CM

## 2015-02-15 DIAGNOSIS — N6321 Unspecified lump in the left breast, upper outer quadrant: Secondary | ICD-10-CM | POA: Insufficient documentation

## 2015-02-15 NOTE — Progress Notes (Signed)
Subjective:    Patient ID: Suzanne Strong, female    DOB: [birth year], 69 y.o.   MRN: [id number]  HPI Here for a lump in her L breast  Sometimes sore - to the touch Noticed it a few weeks ago   No nipple d/c  No skin change   2/16 nl mammogram   Has had breast bx in the past-neg (has asp a couple of cysts in the past)  Hx of fibrocystic breasts   Patient Active Problem List   Diagnosis Date Noted  . Initial Medicare annual wellness visit 10/16/2014  . Estrogen deficiency 10/16/2014  . Colon cancer screening 10/16/2014  . RLS (restless legs syndrome) 02/21/2014  . Chronic neck pain 07/25/2010  . Diabetes type 2, uncontrolled ([place]) 07/07/2010  . Depression 07/07/2010  . Routine general medical examination at a health care facility 06/26/2010  . B12 deficiency 09/04/2009  . Anemia, iron deficiency 08/29/2009  . ANXIETY 08/26/2009  . GASTROPARESIS 08/26/2009  . Vitamin D deficiency 07/19/2008  . Disorder of bone and cartilage 07/19/2007  . MALIGNANT NEOPLASM OF TONSIL 07/18/2007  . Hyperlipidemia 07/18/2007  . EDEMA 07/18/2007   Past Medical History  Diagnosis Date  . Hyperlipidemia   . 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[place])   . Diverticulosis   . Shingles     Hx of  . Gastroparesis     related to previous radiation tx  . Anxiety   . Anemia     iron deficiency and B12 deficiency  . Cervical dysplasia     conization   Past Surgical History  Procedure Laterality Date  . Breast surgery      breast biopsy benign  . Appendectomy    . Tonsillectomy      cancer treated with chemo and radiation  . Cholecystectomy    . Mole removal    . Esophagogastroduodenoscopy  07/2009    normal with few gastric polyps   Social History  Substance Use Topics  . Smoking status: Former Research scientist (life sciences)  . Smokeless tobacco: Never Used  . Alcohol Use: No   Family History  Problem Relation Age of Onset  . Osteoporosis Mother   . Hyperlipidemia Mother   . Depression Mother   . Cancer Mother    skin cancer ? basal cell and lung ca heavy smoker  . Alcohol abuse Father   . Cancer Father     skin CA ? basal cell  . Heart disease Father     CHF  . Hyperlipidemia Brother   . Hypertension Brother    Allergies  Allergen Reactions  . Amoxicillin     REACTION: rash  . Fluconazole     REACTION: hives  . Gabapentin     depression   Current Outpatient Prescriptions on File Prior to Visit  Medication Sig Dispense Refill  . ALPRAZolam (XANAX) 0.5 MG tablet Take 1 tablet (0.5 mg total) by mouth at bedtime as needed for sleep. 30 tablet 3  . atorvastatin (LIPITOR) 10 MG tablet Take 1 tablet (10 mg total) by mouth daily. 30 tablet 11  . b complex vitamins tablet Take 1 tablet by mouth daily.      . cyanocobalamin (,VITAMIN B-12,) 1000 MCG/ML injection Inject 1,000 mcg into the muscle every 30 (thirty) days.      . ferrous sulfate 325 (65 FE) MG tablet Take 325 mg by mouth daily with breakfast.      . fluticasone (FLONASE) 50 MCG/ACT nasal spray instill 2 sprays into each nostril once daily  16 g 5  . glipiZIDE (GLUCOTROL XL) 10 MG 24 hr tablet take 1 tablet by mouth once daily 30 tablet 0  . glucose blood (ONE TOUCH TEST STRIPS) test strip Use as instructed to test blood sugar once daily or as directed 100 each 3  . lisinopril (PRINIVIL,ZESTRIL) 5 MG tablet Take 1 tablet (5 mg total) by mouth daily. 30 tablet 11  . mirtazapine (REMERON) 15 MG tablet Take 45 mg by mouth at bedtime.     [name] omeprazole (PRILOSEC) 20 MG capsule Take 1 capsule (20 mg total) by mouth daily. *follow-up with provider required before future refills are given* 30 capsule 11  . ONETOUCH DELICA LANCETS FINE MISC 1 Device by Does not apply route as directed. Use to test blood sugar once daily or as directed 100 each 3  . Polyethylene Glycol 3350 (MIRALAX PO) Take by mouth as needed.    . Probiotic Product (ALIGN PO) Take 1 tablet by mouth daily.    [name] rOPINIRole (REQUIP) 1 MG tablet 1 pill in the am as needed by mouth and  3 pills at bedtime by mouth 120 tablet 11  . sertraline (ZOLOFT) 50 MG tablet Take 50 mg by mouth daily.     No current facility-administered medications on file prior to visit.      Review of Systems Review of Systems  Constitutional: Negative for fever, appetite change, fatigue and unexpected weight change.  Eyes: Negative for pain and visual disturbance.  Respiratory: Negative for cough and shortness of breath.   Cardiovascular: Negative for cp or palpitations    Gastrointestinal: Negative for nausea, diarrhea and constipation.  Genitourinary: Negative for urgency and frequency. pos for breast lump and mild tenderness Skin: Negative for pallor or rash   Neurological: Negative for weakness, light-headedness, numbness and headaches.  Hematological: Negative for adenopathy. Does not bruise/bleed easily.  Psychiatric/Behavioral: Negative for dysphoric mood. The patient is not nervous/anxious.         Objective:   Physical Exam  Constitutional: She appears well-developed and well-nourished. No distress.  obese and well appearing   Eyes: Conjunctivae and EOM are normal. Pupils are equal, round, and reactive to light.  Neck: Normal range of motion. Neck supple.  Cardiovascular: Normal rate and regular rhythm.   Pulmonary/Chest: Effort normal and breath sounds normal. No respiratory distress. She has no wheezes. She has no rales.  Genitourinary: There is breast tenderness. No breast swelling, discharge or bleeding.  L breast- large mobile lump at approx 2:00 position  Mildly tender - no redness or skin change  No nipple d/c  Breast exam R  No mass, nodules, thickening, tenderness, bulging, retraction, inflamation, nipple discharge or skin changes noted.  No axillary or clavicular LA.      Lymphadenopathy:    She has no cervical adenopathy.  Neurological: She is alert.  Skin: Skin is warm and dry. No rash noted. No erythema.  Psychiatric: She has a normal mood and affect.           Assessment & Plan:   Problem List Items Addressed This Visit      Other   Breast lump on left side at 2 o'clock position - Primary    Large lump at approx 2:00 pos in L breast in pt with hx of breast cysts and aspirations   (has seen dr Marlyn Corporal in the past) Ref for diag mm (bilat) and [place] - L  Will make plan from there Suspect cyst  Relevant Orders   MM Digital Diagnostic Bilat   US BREAST LTD UNI LEFT INC AXILLA   US BREAST LTD UNI RIGHT INC AXILLA

## 2015-02-15 NOTE — Assessment & Plan Note (Signed)
Large lump at approx 2:00 pos in L breast in pt with hx of breast cysts and aspirations   (has seen dr Marlyn Corporal in the past) Ref for diag mm (bilat) and [place] - L  Will make plan from there Suspect cyst

## 2015-02-15 NOTE — Patient Instructions (Signed)
Stop at check out for referral to breast imaging  Keep me posted if anything changes

## 2015-02-15 NOTE — Progress Notes (Signed)
Pre visit review using our clinic review tool, if applicable. No additional management support is needed unless otherwise documented below in the visit note. 

## 2015-02-17 DIAGNOSIS — Z923 Personal history of irradiation: Secondary | ICD-10-CM

## 2015-02-17 HISTORY — DX: Personal history of irradiation: Z92.3

## 2015-02-22 ENCOUNTER — Encounter: Payer: Self-pay | Admitting: *Deleted

## 2015-02-25 ENCOUNTER — Other Ambulatory Visit: Payer: Self-pay | Admitting: Family Medicine

## 2015-02-25 ENCOUNTER — Ambulatory Visit
Admission: RE | Admit: 2015-02-25 | Discharge: 2015-02-25 | Disposition: A | Discharge status: Home / Self Care | Payer: Medicare Other | Source: Ambulatory Visit | Attending: Family Medicine | Admitting: Family Medicine

## 2015-02-25 DIAGNOSIS — N6321 Unspecified lump in the left breast, upper outer quadrant: Secondary | ICD-10-CM

## 2015-02-25 DIAGNOSIS — N63 Unspecified lump in breast: Secondary | ICD-10-CM | POA: Diagnosis present

## 2015-02-25 DIAGNOSIS — R590 Localized enlarged lymph nodes: Secondary | ICD-10-CM | POA: Diagnosis not present

## 2015-02-25 DIAGNOSIS — C50412 Malignant neoplasm of upper-outer quadrant of left female breast: Secondary | ICD-10-CM | POA: Diagnosis not present

## 2015-02-25 DIAGNOSIS — C50919 Malignant neoplasm of unspecified site of unspecified female breast: Secondary | ICD-10-CM

## 2015-02-25 HISTORY — PX: BREAST BIOPSY: SHX20

## 2015-02-25 HISTORY — DX: Malignant neoplasm of unspecified site of unspecified female breast: C50.919

## 2015-02-26 ENCOUNTER — Telehealth: Payer: Self-pay | Admitting: *Deleted

## 2015-02-26 NOTE — Telephone Encounter (Signed)
I called results to her so she is aware and she is home for them to call her Thanks

## 2015-02-26 NOTE — Telephone Encounter (Signed)
Talked to Earleen Reaper, [place] today at Dr. Alba Cory office and she confirmed that Dr. Glori Bickers had notified patient of her pathology results, and the patient would be expecting my call.  Talked to patient today to establish navigation services.  Patient has been a patient of Dr. Metro Kung for over 10 years.  I have scheduled her an appointment to see him on Tuesday, 03/05/15 @ 8:00.  Patient prefers that I give her education material at that time instead of mailing them to her.  States "I'm not a person who looks up everything on the internet, I'll be fine to wait until Tuesday."  Offered support.  She is to call if she has any questions or needs.

## 2015-02-26 NOTE — Telephone Encounter (Signed)
Shena with the [facility] called with call report. They received the Breast pathology and it does show pt has breast cancer. Pathology results are in EPIC under labs. Shena advise me that Dr. Glori Bickers needs to give pt the pathology results (Breast Cancer), before they call her with her oncology appt. They have already scheduled her an appt for 03/05/15 with Dr. Oliva Bustard since she has seen that doc in the past, but they need Dr. Glori Bickers to give her the results before [facility] calls pt with appt info.  [place] request a call back once Dr. Glori Bickers has spoken with pt so she can call pt back with appt info [facility] call back # [phone])

## 2015-02-26 NOTE — Telephone Encounter (Signed)
Left voicemail letting [facility] know she can call pt with appt info

## 2015-02-28 LAB — SURGICAL PATHOLOGY

## 2015-03-01 ENCOUNTER — Encounter: Payer: Self-pay | Admitting: Oncology

## 2015-03-01 ENCOUNTER — Inpatient Hospital Stay: Payer: Medicare Other | Attending: Oncology | Admitting: Oncology

## 2015-03-01 ENCOUNTER — Inpatient Hospital Stay: Payer: Medicare Other

## 2015-03-01 VITALS — BP 153/80 | HR 84 | Temp 97.0°F | Resp 18 | Wt 218.0 lb

## 2015-03-01 DIAGNOSIS — K219 Gastro-esophageal reflux disease without esophagitis: Secondary | ICD-10-CM | POA: Diagnosis not present

## 2015-03-01 DIAGNOSIS — Z9221 Personal history of antineoplastic chemotherapy: Secondary | ICD-10-CM | POA: Diagnosis not present

## 2015-03-01 DIAGNOSIS — I10 Essential (primary) hypertension: Secondary | ICD-10-CM | POA: Insufficient documentation

## 2015-03-01 DIAGNOSIS — Z85818 Personal history of malignant neoplasm of other sites of lip, oral cavity, and pharynx: Secondary | ICD-10-CM | POA: Diagnosis not present

## 2015-03-01 DIAGNOSIS — E785 Hyperlipidemia, unspecified: Secondary | ICD-10-CM | POA: Insufficient documentation

## 2015-03-01 DIAGNOSIS — D0592 Unspecified type of carcinoma in situ of left breast: Secondary | ICD-10-CM

## 2015-03-01 DIAGNOSIS — C50412 Malignant neoplasm of upper-outer quadrant of left female breast: Secondary | ICD-10-CM

## 2015-03-01 DIAGNOSIS — Z5111 Encounter for antineoplastic chemotherapy: Secondary | ICD-10-CM | POA: Diagnosis not present

## 2015-03-01 DIAGNOSIS — Z801 Family history of malignant neoplasm of trachea, bronchus and lung: Secondary | ICD-10-CM | POA: Insufficient documentation

## 2015-03-01 DIAGNOSIS — Z79899 Other long term (current) drug therapy: Secondary | ICD-10-CM | POA: Diagnosis not present

## 2015-03-01 DIAGNOSIS — L538 Other specified erythematous conditions: Secondary | ICD-10-CM | POA: Insufficient documentation

## 2015-03-01 DIAGNOSIS — Z923 Personal history of irradiation: Secondary | ICD-10-CM | POA: Diagnosis not present

## 2015-03-01 DIAGNOSIS — E119 Type 2 diabetes mellitus without complications: Secondary | ICD-10-CM | POA: Insufficient documentation

## 2015-03-01 DIAGNOSIS — Z01818 Encounter for other preprocedural examination: Secondary | ICD-10-CM

## 2015-03-01 DIAGNOSIS — C50912 Malignant neoplasm of unspecified site of left female breast: Secondary | ICD-10-CM

## 2015-03-01 DIAGNOSIS — Z7689 Persons encountering health services in other specified circumstances: Secondary | ICD-10-CM | POA: Insufficient documentation

## 2015-03-01 DIAGNOSIS — Z87891 Personal history of nicotine dependence: Secondary | ICD-10-CM | POA: Diagnosis not present

## 2015-03-01 DIAGNOSIS — K5790 Diverticulosis of intestine, part unspecified, without perforation or abscess without bleeding: Secondary | ICD-10-CM | POA: Insufficient documentation

## 2015-03-01 DIAGNOSIS — Z171 Estrogen receptor negative status [ER-]: Secondary | ICD-10-CM | POA: Diagnosis not present

## 2015-03-01 LAB — CBC WITH DIFFERENTIAL/PLATELET
Basophils Absolute: 0 10*3/uL (ref 0–0.1)
Basophils Relative: 1 %
EOS ABS: 0.2 10*3/uL (ref 0–0.7)
Eosinophils Relative: 2 %
HEMATOCRIT: 35.2 % (ref 35.0–47.0)
HEMOGLOBIN: 11.2 g/dL — AB (ref 12.0–16.0)
LYMPHS ABS: 1.3 10*3/uL (ref 1.0–3.6)
LYMPHS PCT: 18 %
MCH: 25.4 pg — AB (ref 26.0–34.0)
MCHC: 32 g/dL (ref 32.0–36.0)
MCV: 79.5 fL — AB (ref 80.0–100.0)
MONOS PCT: 6 %
Monocytes Absolute: 0.4 10*3/uL (ref 0.2–0.9)
NEUTROS PCT: 73 %
Neutro Abs: 5.4 10*3/uL (ref 1.4–6.5)
Platelets: 301 10*3/uL (ref 150–440)
RBC: 4.43 MIL/uL (ref 3.80–5.20)
RDW: 15.7 % — ABNORMAL HIGH (ref 11.5–14.5)
WBC: 7.3 10*3/uL (ref 3.6–11.0)

## 2015-03-01 NOTE — Progress Notes (Signed)
Patient here today for follow up regarding recent breast bx.

## 2015-03-01 NOTE — Progress Notes (Signed)
[place] @ [facility] Telephone:(336) [phone]  Fax:(336) [place]: 1946/01/10  MR#: [id number]  KUV#:[id number]  Patient Care Team: [name], MD as PCP - General  CHIEF COMPLAINT:  Chief Complaint  Patient presents with  . 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  5 CM TUMOR MASS BIOPSIES POSITIVE FOR INVASIVE MAMMARY CARCINOMA TRIPLE NEGATIVE DISEASE(jANUARY, 2017)stage IIIa VISIT DIAGNOSIS:     ICD-9-CM ICD-10-CM   1. Examination prior to chemotherapy V72.83 Z01.818 NM Cardiac [place]     NM Cardiac [place]     CBC with Differential     Comprehensive metabolic panel     Magnesium  2. Carcinoma in situ of left breast 233.0 D05.92   3. Malignant neoplasm of left female breast, unspecified site of breast (HCC) 174.9 C50.912 NM PET Image Initial (PI) Whole Body     CBC with Differential     Comprehensive metabolic panel     Magnesium  4. High risk medication use V58.69 Z79.899 NM Cardiac [place]     NM Cardiac [place]  5. Breast CA, left 174.9 C50.912 NM Cardiac [place]     NM Cardiac [place]      No history exists.      INTERVAL HISTORY: 70-YEAR-OLD LADY WHO HAD BEEN PREVIOUSLY SEEN BY ME FOR TONSILLAR CANCER STATUS POST RESECTION AND RADIATION AND CHEMOTHERAPY. According to patient she was getting mammograms done on a regular basis however looking at old records do not have any mammogram available for review until January of 2017 Patient felt a mass in the left breast.  Mammogram was abnormal ultrasound revealed 5 cm tumor mass with questionable and suspicious axillary lymph node.  Biopsy of which was positive for invasive mammary carcinoma.  Estrogen and progesterone receptor negative  HER-2/neu negative (triple negative disease) even though lymph node biopsy was negative for radiological suspicion was very high of lymph node involvement  REVIEW OF SYSTEMS:   GENERAL:  Feels good.  Active.  No fevers, sweats or weight loss. PERFORMANCE STATUS (ECOG):  0-1 HEENT:  No visual changes, runny nose, sore throat, mouth sores or tenderness. had a recurrent sinus infection and and a previous history of tonsillar cancer Lungs: No shortness of breath or cough.  No hemoptysis. Cardiac:  No chest pain, palpitations, orthopnea, or PND. GI:  No nausea, vomiting, diarrhea, constipation, melena or hematochezia. GU:  No urgency, frequency, dysuria, or hematuria. Musculoskeletal:  No back pain.  No joint pain.  No muscle tenderness. Extremities:  No pain or swelling. Skin:  No rashes or skin changes. Neuro:  No headache, numbness or weakness, balance or coordination issues. Endocrine:  No diabetes, thyroid issues, hot flashes or night sweats. Psych:  No mood changes, depression or anxiety. Pain:  No focal pain. Review of systems:  All other systems reviewed and found to be negative.  As per HPI. Otherwise, a complete review of systems is negatve.  PAST MEDICAL HISTORY: Past Medical History  Diagnosis Date  . Hyperlipidemia   . 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[place])   . Diverticulosis   . Shingles     Hx of  . Gastroparesis     related to previous radiation tx  . Anxiety   . Anemia     iron deficiency  and B12 deficiency  . Cervical dysplasia     conization    PAST SURGICAL HISTORY: Past Surgical History  Procedure Laterality Date  . Breast surgery      breast biopsy benign  . Appendectomy    . Tonsillectomy      cancer treated with chemo and radiation  . Cholecystectomy    . Mole removal    . Esophagogastroduodenoscopy  07/2009    normal with few gastric polyps  . Breast cyst aspiration    . Breast biopsy Left 02/25/2015    path pending    FAMILY HISTORY Family History  Problem  Relation Age of Onset  . Osteoporosis Mother   . Hyperlipidemia Mother   . Depression Mother   . Cancer Mother     skin cancer ? basal cell and lung ca heavy smoker  . Alcohol abuse Father   . Cancer Father     skin CA ? basal cell  . Heart disease Father     CHF  . Hyperlipidemia Brother   . Hypertension Brother    Significant History/PMH:   Hypercholesterolemia:    tonsil cancer:    Sinus Surgery:    breast biopsy-negative: 2000   cone biopsy for cervical dysplasia 1980's:    Cholecystectomy: 2000   Appendectomy: 1970  Preventive Screening:  Has patient had any of the following test?as per patient as he was getting regular mammogram done but we cannot find any record in   [place] breast cancer Mammography (1)  Smoking History: Smoking History quit 1980's (early)(1)  PFSH: Additional Past Medical and Surgical History: Past Medical History  Hypercholesterolemia.    Past Surgical History:  Appendectomy in 1970.  Gallbladder removal in 2000.  Cone biopsy for cervical dysplasia in the early 80's.  Breast biopsy in early 2000 which was negative.    Family History:  There is a family history of heart disease. She denies a family history of any cancer, diabetes, or any bleeding disorders.    Social History:  She is married. She does not have any children. She works at a [employer]. She has a history of smoking about 1 pack every 2 days from 1968-1990. She denies any alcohol use. She denies any recreational drug use. She denies any radiation exposure         ADVANCED DIRECTIVES:    HEALTH MAINTENANCE: Social History  Substance Use Topics  . Smoking status: Former Research scientist (life sciences)  . Smokeless tobacco: Never Used  . Alcohol Use: No      Allergies  Allergen Reactions  . Amoxicillin     REACTION: rash  . Fluconazole     REACTION: hives  . Gabapentin     depression    Current Outpatient Prescriptions  Medication Sig Dispense Refill  .  ALPRAZolam (XANAX) 0.5 MG tablet Take 1 tablet (0.5 mg total) by mouth at bedtime as needed for sleep. 30 tablet 3  . atorvastatin (LIPITOR) 10 MG tablet Take 1 tablet (10 mg total) by mouth daily. 30 tablet 11  . b complex vitamins tablet Take 1 tablet by mouth daily.      . cyanocobalamin (,VITAMIN B-12,) 1000 MCG/ML injection Inject 1,000 mcg into the muscle every 30 (thirty) days.      . ferrous sulfate 325 (65 FE) MG tablet Take 325 mg by mouth daily with breakfast.      . fluticasone (FLONASE) 50 MCG/ACT nasal spray instill 2 sprays into each nostril once daily 16 g 5  .  glucose blood (ONE TOUCH TEST STRIPS) test strip Use as instructed to test blood sugar once daily or as directed 100 each 3  . JARDIANCE 25 MG TABS tablet Take 1 tablet by mouth daily.  1  . lisinopril (PRINIVIL,ZESTRIL) 5 MG tablet Take 1 tablet (5 mg total) by mouth daily. 30 tablet 11  . mirtazapine (REMERON) 15 MG tablet Take 45 mg by mouth at bedtime.     [name] omeprazole (PRILOSEC) 20 MG capsule Take 1 capsule (20 mg total) by mouth daily. *follow-up with provider required before future refills are given* 30 capsule 11  . ONETOUCH DELICA LANCETS FINE MISC 1 Device by Does not apply route as directed. Use to test blood sugar once daily or as directed 100 each 3  . rOPINIRole (REQUIP) 1 MG tablet 1 pill in the am as needed by mouth and 3 pills at bedtime by mouth 120 tablet 11  . sertraline (ZOLOFT) 50 MG tablet Take 50 mg by mouth daily.     No current facility-administered medications for this visit.    OBJECTIVE: PHYSICAL EXAM: Gen. Status: Patient is alert oriented not in any acute distress HEENT: Patient previously had tonsillar cancer status post chemoradiation therapy and resection.  There is no evidence of recurrent disease Lymphatic system: Supraclavicular, cervical,  inguinal lymph nodes are not palpable, There might be palpable left axillary lymph node. Examination of the chest was unremarkable. There were no  bony deformities, no asymmetry, and no other abnormalities.  Cardiac exam revealed the PMI to be normally situated and sized. The rhythm was regular and no extrasystoles were noted during several minutes of auscultation. The first and second heart sounds were normal and physiologic splitting of the second heart sound was noted. There were no murmurs, rubs, clicks, or gallops. Neurologically, the patient was awake, alert, and oriented to person, place and time. There were no obvious focal neurologic abnormalities. Examination of breasts: Right breast free of masses.  Left breast palpable mass 4.5-5 cm.  There is redness on the skin.  That might be a palpable left axillary lymph node  Filed Vitals:   03/01/15 1401  BP: 153/80  Pulse: 84  Temp: 97 F (36.1 C)  Resp: 18     Body mass index is 35.72 kg/(m^2).    ECOG FS:1 - Symptomatic but completely ambulatory  LAB RESULTS:     STUDIES: Mm Digital Diagnostic Unilat L  02/25/2015  CLINICAL DATA:  Status post ultrasound-guided core biopsy left breast mass EXAM: DIAGNOSTIC LEFT MAMMOGRAM POST ULTRASOUND BIOPSY COMPARISON:  Previous exam(s). FINDINGS: Mammographic images were obtained following left breast ultrasound guided biopsy of left breast mass. Cc and lateral views of the left breast demonstrate ribbon biopsy clip in the mass. IMPRESSION: Post biopsy mammogram demonstrating biopsy clip in the mass of concern. Final Assessment: Post Procedure Mammograms for Marker Placement Electronically Signed   By: Abelardo Diesel M.D.   On: 02/25/2015 13:30   US Breast Ltd Uni Left Inc Axilla  02/25/2015  CLINICAL DATA:  Palpable lump left breast EXAM: DIGITAL DIAGNOSTIC BILATERAL MAMMOGRAM WITH 3D TOMOSYNTHESIS WITH CAD ULTRASOUND LEFT BREAST COMPARISON:  Previous exam(s). ACR Breast Density Category b: There are scattered areas of fibroglandular density. FINDINGS: Cc and MLO views of bilateral breasts are submitted. There is a irregular mass in the palpable  area upper-outer quadrant left breast. Abnormal enlarged left axillary lymph nodes are identified. The right breasts is negative. Mammographic images were processed with CAD. Targeted ultrasound is performed, showing a  5.2 x 4.7 x 5.1 cm irregular hypoechoic mass at the left breast 12:30 o'clock 8 cm from nipple palpable area. In the left axilla, there are abnormal enlarged thickened cortex lymph nodes. IMPRESSION: Highly suspicious findings. RECOMMENDATION: Ultrasound-guided core biopsy of left breast mass and abnormal left axillary lymph node. I have discussed the findings and recommendations with the patient. Results were also provided in writing at the conclusion of the visit. If applicable, a reminder letter will be sent to the patient regarding the next appointment. BI-RADS CATEGORY  5: Highly suggestive of malignancy. Electronically Signed   By: Abelardo Diesel M.D.   On: 02/25/2015 12:24   Mm Diag Breast Tomo Bilateral  02/25/2015  CLINICAL DATA:  Palpable lump left breast EXAM: DIGITAL DIAGNOSTIC BILATERAL MAMMOGRAM WITH 3D TOMOSYNTHESIS WITH CAD ULTRASOUND LEFT BREAST COMPARISON:  Previous exam(s). ACR Breast Density Category b: There are scattered areas of fibroglandular density. FINDINGS: Cc and MLO views of bilateral breasts are submitted. There is a irregular mass in the palpable area upper-outer quadrant left breast. Abnormal enlarged left axillary lymph nodes are identified. The right breasts is negative. Mammographic images were processed with CAD. Targeted ultrasound is performed, showing a 5.2 x 4.7 x 5.1 cm irregular hypoechoic mass at the left breast 12:30 o'clock 8 cm from nipple palpable area. In the left axilla, there are abnormal enlarged thickened cortex lymph nodes. IMPRESSION: Highly suspicious findings. RECOMMENDATION: Ultrasound-guided core biopsy of left breast mass and abnormal left axillary lymph node. I have discussed the findings and recommendations with the patient. Results were  also provided in writing at the conclusion of the visit. If applicable, a reminder letter will be sent to the patient regarding the next appointment. BI-RADS CATEGORY  5: Highly suggestive of malignancy. Electronically Signed   By: Abelardo Diesel M.D.   On: 02/25/2015 12:24   [place] Lt Breast Bx W Loc Dev 1st Lesion Img Bx Spec US Guide  02/26/2015  ADDENDUM REPORT: 02/26/2015 16:52 ADDENDUM: Pathology of the left breast biopsy revealed FRAGMENTS OF INVASIVE MAMMARY CARCINOMA, NO SPECIAL TYPE. PRELIMINARY GRADE: 3 (NOTTINGHAM GRADE). These findings were communicated to Tanya Nones, RN in the Breast Clinic on 02/26/2015 by pathologist. Read back procedure was performed. This was found to be concordant by Dr. Augustin Coupe. Recommendations:  Surgical and oncology referral. The patient was contacted by Jetta Lout, [facility]) on 02/26/15 for a post biopsy check. The patient stated she has done well following the biopsy with no bleeding or bruising at the biopsy site. Post biopsy instructions were reviewed with the patient. All of her questions were answered. She stated she has been notified by Dr. Glori Bickers of the results. An appointment with Dr. Oliva Bustard, oncology has been made and relayed to the patient by Tanya Nones, RN. The patient was encouraged to call the [place] [facility] with any further questions or concerns related to the biopsy site. Addendum by Jetta Lout, RRA on 02/26/15. Electronically Signed   By: Abelardo Diesel M.D.   On: 02/26/2015 16:52  02/26/2015  CLINICAL DATA:  Left breast mass for biopsy EXAM: ULTRASOUND GUIDED LEFT BREAST CORE NEEDLE BIOPSY COMPARISON:  Previous exam(s). PROCEDURE: I met with the patient and we discussed the procedure of ultrasound-guided biopsy, including benefits and alternatives. We discussed the high likelihood of a successful procedure. We discussed the risks of the procedure including infection, bleeding, tissue injury, clip  migration, and inadequate sampling. Informed written consent was given. The usual time-out protocol was  performed immediately prior to the procedure. Using sterile technique and 1% Lidocaine as local anesthetic, under direct ultrasound visualization, a 9 gauge vacuum-assisted device was used to perform biopsy of mass at left breast 12:30 o'clockusing a lateral approach. At the conclusion of the procedure, a ribbon tissue marker clip was deployed into the biopsy cavity. Follow-up 2-view mammogram was performed and dictated separately. IMPRESSION: Ultrasound-guided biopsy of left breast.  No apparent complications. Electronically Signed: By: Abelardo Diesel M.D. On: 02/25/2015 13:38   [place] Lt Breast Bx W Loc Dev Ea Add Lesion Img Bx Spec US Guide  02/27/2015  ADDENDUM REPORT: 02/26/2015 17:01 ADDENDUM: Pathology of the left axillary lymph node revealed LYMPH NODE, NEGATIVE FOR MALIGNANCY. These findings were communicated to Tanya Nones, RN in the Breast Clinic on 02/26/2015 by the pathologist. This was found to be discordant by Dr. Augustin Coupe. Recommendations: Recommend to consider further sampling as the lymph node is very suspicious on imaging. Needle biopsy of the lymph node is not recommended as this biopsy was in the center of the lymph node. This recommendation was relayed to both Dr. Glori Bickers and Tanya Nones, RN by Jetta Lout, [facility]). The patient was contacted by Jetta Lout, RRA for a post biopsy check on 02/26/15. She stated she has done well following the biopsy with no bleeding or bruising. Post biopsy instructions were reviewed with the patient. All of her questions were answered. She was encouraged to call the [place] [facility] with any further questions or concerns. The patient stated she has been contacted by Dr. Glori Bickers with the results. An appointment has been made for the patient with Dr. Oliva Bustard on Tuesday, 03/05/15 and relayed to the patient by  Tanya Nones. Addendum by Jetta Lout, RRA on 02/26/15. Electronically Signed   By: Abelardo Diesel M.D.   On: 02/26/2015 17:01  02/27/2015  CLINICAL DATA:  Abnormal left axillary lymph node for biopsy EXAM: ULTRASOUND GUIDED CORE NEEDLE BIOPSY OF A LEFT AXILLARY NODE COMPARISON:  Previous exam(s). FINDINGS: I met with the patient and we discussed the procedure of ultrasound-guided biopsy, including benefits and alternatives. We discussed the high likelihood of a successful procedure. We discussed the risks of the procedure, including infection, bleeding, tissue injury, clip migration, and inadequate sampling. Informed written consent was given. The usual time-out protocol was performed immediately prior to the procedure. Using sterile technique and 1% Lidocaine as local anesthetic, under direct ultrasound visualization, a 14 gauge spring-loaded device was used to perform biopsy of abnormal left axillary lymph node using a lateral approach. At the conclusion of the procedure no tissue marker clip was deployed into the biopsy cavity. IMPRESSION: Ultrasound guided biopsy of abnormal left axillary lymph node. No apparent complications. Electronically Signed: By: Abelardo Diesel M.D. On: 02/25/2015 13:39    ASSESSMENT: carcinoma of breast.  Left upper and outer quadrant in size of tumor. Palpable axillary lymph node initial biopsy of lymph node was negative however there is strong radiological suspicion of the lymph node involvement. There is redness on the skin which according to patient started after biopsy. If it persists or gets worse antibiotic therapy or biopsy of the skin to rule out inflammatory carcinoma may be needed. Based on now mammogram ultrasound patient has been staged as IIIa disease Patient has had triple negative disease  2. Previous history of tonsillar cancer diagnosed in 2006 without any evidence of recurrent disease status post cis-platinum and 5-FU chemotherapy    PLAN:  All lab  data, pathology as well as ultrasound and mammogram has been reviewed  We will make an appointment for Dr. Bary Castilla to see the patient. I discussed with the patient local therapy as well as systemic therapy Patient may be a candidate for neoadjuvant therapy depending on the further staging results from the PET scan  Whether we need MRI scan of breast or not will be decided after consultation with Dr Bary Castilla.  Patient will undergo a MUGA scan.  If we decide to go with neoadjuvant chemotherapy patient would need port placement And will have to attend the chemotherapy class arrangements are being made after reviewing PET scan.  Chemotherapy with Cytoxan and Adriamycin followed by Taxol and addition of carboplatinum may be considered if tolerated.  We did not find any previous mammogram done at [facility].  So we will call her primary care physician and if find out whether any mammogram has been done at outside institution.  As patient is very sure that the mammogram was done last year  Patient expressed understanding and was in agreement with this plan. She also understands that She can call clinic at any time with any questions, concerns, or complaints.    No matching staging information was found for the patient.  [name], MD   03/01/2015 3:17 PM

## 2015-03-04 ENCOUNTER — Encounter: Payer: Self-pay | Admitting: *Deleted

## 2015-03-04 LAB — COMPREHENSIVE METABOLIC PANEL
ALBUMIN: 3.6 g/dL (ref 3.5–5.0)
ALT: 21 U/L (ref 14–54)
AST: 27 U/L (ref 15–41)
Alkaline Phosphatase: 91 U/L (ref 38–126)
Anion gap: 5 (ref 5–15)
BILIRUBIN TOTAL: 0.7 mg/dL (ref 0.3–1.2)
BUN: 14 mg/dL (ref 6–20)
CO2: 28 mmol/L (ref 22–32)
CREATININE: 0.93 mg/dL (ref 0.44–1.00)
Calcium: 9 mg/dL (ref 8.9–10.3)
Chloride: 105 mmol/L (ref 101–111)
GFR calc Af Amer: >60 mL/min (ref >60)
GLUCOSE: 157 mg/dL — AB (ref 65–99)
POTASSIUM: 4.3 mmol/L (ref 3.5–5.1)
Sodium: 138 mmol/L (ref 135–145)
TOTAL PROTEIN: 7.6 g/dL (ref 6.5–8.1)

## 2015-03-04 LAB — MAGNESIUM: Magnesium: 1.9 mg/dL (ref 1.7–2.4)

## 2015-03-05 ENCOUNTER — Ambulatory Visit: Payer: Medicare Other | Admitting: Oncology

## 2015-03-05 ENCOUNTER — Other Ambulatory Visit: Payer: Self-pay | Admitting: *Deleted

## 2015-03-05 ENCOUNTER — Ambulatory Visit
Admission: RE | Admit: 2015-03-05 | Discharge: 2015-03-05 | Disposition: A | Discharge status: Home / Self Care | Payer: Medicare Other | Source: Ambulatory Visit | Attending: Oncology | Admitting: Oncology

## 2015-03-05 DIAGNOSIS — I2510 Atherosclerotic heart disease of native coronary artery without angina pectoris: Secondary | ICD-10-CM | POA: Insufficient documentation

## 2015-03-05 DIAGNOSIS — C50912 Malignant neoplasm of unspecified site of left female breast: Secondary | ICD-10-CM | POA: Diagnosis not present

## 2015-03-05 DIAGNOSIS — Z0189 Encounter for other specified special examinations: Secondary | ICD-10-CM | POA: Insufficient documentation

## 2015-03-05 DIAGNOSIS — I700 Atherosclerosis of aorta: Secondary | ICD-10-CM | POA: Diagnosis not present

## 2015-03-05 DIAGNOSIS — C50412 Malignant neoplasm of upper-outer quadrant of left female breast: Secondary | ICD-10-CM | POA: Diagnosis not present

## 2015-03-05 DIAGNOSIS — J323 Chronic sphenoidal sinusitis: Secondary | ICD-10-CM | POA: Diagnosis not present

## 2015-03-05 DIAGNOSIS — M8538 Osteitis condensans, other site: Secondary | ICD-10-CM | POA: Diagnosis not present

## 2015-03-05 HISTORY — DX: Type 2 diabetes mellitus without complications: E11.9

## 2015-03-05 LAB — GLUCOSE, CAPILLARY: Glucose-Capillary: 140 mg/dL — ABNORMAL HIGH (ref 65–99)

## 2015-03-05 MED ORDER — FLUDEOXYGLUCOSE F - 18 (FDG) INJECTION
12.0000 | Freq: Once | INTRAVENOUS | Status: AC | PRN
  Administered 2015-03-05: 12.18 via INTRAVENOUS

## 2015-03-05 MED ORDER — ATORVASTATIN CALCIUM 10 MG PO TABS: 10.0000 mg | ORAL_TABLET | Freq: Every day | ORAL | Status: DC

## 2015-03-05 MED ORDER — ROPINIROLE HCL 1 MG PO TABS: ORAL_TABLET | ORAL | Status: DC

## 2015-03-05 MED ORDER — LISINOPRIL 5 MG PO TABS: 5.0000 mg | ORAL_TABLET | Freq: Every day | ORAL | Status: DC

## 2015-03-05 MED ORDER — OMEPRAZOLE 20 MG PO CPDR: 20.0000 mg | DELAYED_RELEASE_CAPSULE | Freq: Every day | ORAL | Status: DC

## 2015-03-05 NOTE — Progress Notes (Signed)
  Oncology Nurse Navigator Documentation  Navigator Location: [facility]-Med Onc (03/05/15 0900) Navigator Encounter Type: Initial MedOnc (03/05/15 0900)           Patient Visit Type: MedOnc (03/05/15 0900)   Barriers/Navigation Needs: Education (03/05/15 0900) Education: Newly Diagnosed Cancer Education (03/05/15 0900)    Met with patient and her husband during her initial medical oncology consult today.  Gave patient breast cancer educational literature, "My Breast Cancer Treatment Handbook" by Josephine Igo, RN.  Offered support.  Reviewed support services available.  She is to call if she has any questions or needs.           Acuity: Level 1 (03/05/15 0900)         Time Spent with Patient: 60 (03/05/15 0900)

## 2015-03-07 ENCOUNTER — Ambulatory Visit
Admission: RE | Admit: 2015-03-07 | Discharge: 2015-03-07 | Disposition: A | Discharge status: Home / Self Care | Payer: Medicare Other | Source: Ambulatory Visit | Attending: Oncology | Admitting: Oncology

## 2015-03-07 ENCOUNTER — Encounter: Payer: Self-pay | Admitting: General Surgery

## 2015-03-07 DIAGNOSIS — I700 Atherosclerosis of aorta: Secondary | ICD-10-CM | POA: Diagnosis not present

## 2015-03-07 DIAGNOSIS — C50919 Malignant neoplasm of unspecified site of unspecified female breast: Secondary | ICD-10-CM | POA: Diagnosis not present

## 2015-03-07 DIAGNOSIS — J323 Chronic sphenoidal sinusitis: Secondary | ICD-10-CM | POA: Diagnosis not present

## 2015-03-07 DIAGNOSIS — Z0189 Encounter for other specified special examinations: Secondary | ICD-10-CM | POA: Diagnosis not present

## 2015-03-07 DIAGNOSIS — C50412 Malignant neoplasm of upper-outer quadrant of left female breast: Secondary | ICD-10-CM

## 2015-03-07 DIAGNOSIS — T451X5A Adverse effect of antineoplastic and immunosuppressive drugs, initial encounter: Secondary | ICD-10-CM | POA: Diagnosis not present

## 2015-03-07 DIAGNOSIS — M8538 Osteitis condensans, other site: Secondary | ICD-10-CM | POA: Diagnosis not present

## 2015-03-07 DIAGNOSIS — I2510 Atherosclerotic heart disease of native coronary artery without angina pectoris: Secondary | ICD-10-CM | POA: Diagnosis not present

## 2015-03-07 MED ORDER — TECHNETIUM TC 99M-LABELED RED BLOOD CELLS IV KIT
20.0000 | PACK | Freq: Once | INTRAVENOUS | Status: AC | PRN
  Administered 2015-03-07: 19.43 via INTRAVENOUS

## 2015-03-11 ENCOUNTER — Encounter: Payer: Self-pay | Admitting: General Surgery

## 2015-03-11 ENCOUNTER — Ambulatory Visit (INDEPENDENT_AMBULATORY_CARE_PROVIDER_SITE_OTHER): Payer: Medicare Other | Admitting: General Surgery

## 2015-03-11 ENCOUNTER — Other Ambulatory Visit: Payer: Medicare Other

## 2015-03-11 ENCOUNTER — Encounter: Payer: Self-pay | Admitting: *Deleted

## 2015-03-11 VITALS — BP 134/78 | HR 78 | Resp 12 | Ht 66.0 in

## 2015-03-11 DIAGNOSIS — C50412 Malignant neoplasm of upper-outer quadrant of left female breast: Secondary | ICD-10-CM

## 2015-03-11 NOTE — Addendum Note (Signed)
Addended by: Dominga Ferry on: 03/11/2015 12:43 PM   Modules accepted: Level of Service

## 2015-03-11 NOTE — Patient Instructions (Addendum)
Implanted Port Insertion An implanted port is a central line that has a round shape and is placed under the skin. It is used as a long-term IV access for:   Medicines, such as chemotherapy.   Fluids.   Liquid nutrition, such as total parenteral nutrition (TPN).   Blood samples.  LET YOUR HEALTH CARE PROVIDER KNOW ABOUT:  Allergies to food or medicine.   Medicines taken, including vitamins, herbs, eye drops, creams, and over-the-counter medicines.   Any allergies to heparin.  Use of steroids (by mouth or creams).   Previous problems with anesthetics or numbing medicines.   History of bleeding problems or blood clots.   Previous surgery.   Other health problems, including diabetes and kidney problems.   Possibility of pregnancy, if this applies. RISKS AND COMPLICATIONS Generally, this is a safe procedure. However, as with any procedure, problems can occur. Possible problems include:  Damage to the blood vessel, bruising, or bleeding at the puncture site.   Infection.  Blood clot in the vessel that the port is in.  Breakdown of the skin over your port.  Very rarely a person may develop a condition called a pneumothorax, a collection of air in the chest that may cause one of the lungs to collapse. The placement of these catheters with the appropriate imaging guidance significantly decreases the risk of a pneumothorax.  BEFORE THE PROCEDURE   Your health care provider may want you to have blood tests. These tests can help tell how well your kidneys and liver are working. They can also show how well your blood clots.   If you take blood thinners (anticoagulant medicines), ask your health care provider when you should stop taking them.   Make arrangements for someone to drive you home. This is necessary if you have been sedated for your procedure.  PROCEDURE  Port insertion usually takes about 30-45 minutes.   An IV needle will be inserted in your arm.  Medicine for pain and medicine to help relax you (sedative) will flow directly into your body through this needle.   You will lie on an exam table, and you will be connected to monitors to keep track of your heart rate, blood pressure, and breathing throughout the procedure.  An oxygen monitoring device may be attached to your finger. Oxygen will be given.   Everything will be kept as germ free (sterile) as possible during the procedure. The skin near the point of the incision will be cleansed with antiseptic, and the area will be draped with sterile towels. The skin and deeper tissues over the port area will be made numb with a local anesthetic.  Two small cuts (incisions) will be made in the skin to insert the port. One will be made in the neck to obtain access to the vein where the catheter will lie.   Because the port reservoir will be placed under the skin, a small skin incision will be made in the upper chest, and a small pocket for the port will be made under the skin. The catheter that will be connected to the port tunnels to a large central vein in the chest. A small, raised area will remain on your body at the site of the reservoir when the procedure is complete.  The port placement will be done under imaging guidance to ensure the proper placement.  The reservoir has a silicone covering that can be punctured with a special needle.   The port will be flushed with normal   saline, and blood will be drawn to make sure it is working properly.  There will be nothing remaining outside the skin when the procedure is finished.   Incisions will be held together by stitches, surgical glue, or a special tape. AFTER THE PROCEDURE  You will stay in a recovery area until the anesthesia has worn off. Your blood pressure and pulse will be checked.  A final chest X-ray will be taken to check the placement of the port and to ensure that there is no injury to your lung.   This information is  not intended to replace advice given to you by your health care provider. Make sure you discuss any questions you have with your health care provider.   Document Released: 11/23/2012 Document Revised: 02/23/2014 Document Reviewed: 11/23/2012 Elsevier Interactive Patient Education 2016 [employer].  Patient's surgery has been scheduled for 03-12-15 at [facility].

## 2015-03-11 NOTE — H&P (Signed)
HPI  Suzanne Strong is a 70 y.o. female here today for a evaluation of a port placement and an evaluation of a left breast cancer. Patient states this was found during a self breast check about one month ago, denies tenderness/pain. The patient had not noticed any problem in this area one month earlier. Patient had a mammogram and ultrasound biopsy was done on 02/25/15 saw Dr. Oliva Bustard after pathology was discussed. Patient has recently undergone a PET scan and MUGA scan.  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  I personally reviewed the patient's history.  HPI  Past Medical History   Diagnosis  Date   .  Hyperlipidemia    .  Diverticulosis    .  Shingles      Hx of   .  Gastroparesis      related to previous radiation tx   .  Anxiety    .  Anemia      iron deficiency and B12 deficiency   .  Cervical dysplasia      conization   .  Diabetes mellitus without complication ([place])    .  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  Breast surgery       breast biopsy benign   .  Appendectomy     .  Tonsillectomy       cancer treated with chemo and radiation   .  Cholecystectomy     .  Mole removal     .  Esophagogastroduodenoscopy   07/2009     normal with few gastric polyps   .  Breast cyst aspiration     .  Breast biopsy  Left  02/25/2015     path pending    Family History   Problem  Relation  Age of Onset   .  Osteoporosis  Mother    .  Hyperlipidemia  Mother    .  Depression  Mother    .  Cancer  Mother      skin cancer ? basal cell and lung ca heavy smoker   .  Alcohol abuse  Father    .  Cancer  Father      skin CA ? basal cell   .  Heart disease  Father      CHF   .  Hyperlipidemia  Brother    .  Hypertension  Brother     Social History  Social History   Substance Use Topics   .  Smoking status:   Former Research scientist (life sciences)   .  Smokeless tobacco:  Never Used   .  Alcohol Use:  No    Allergies   Allergen  Reactions   .  Amoxicillin      REACTION: rash   .  Fluconazole      REACTION: hives   .  Gabapentin      depression    Current Outpatient Prescriptions   Medication  Sig  Dispense  Refill   .  ALPRAZolam (XANAX) 0.5 MG tablet  Take 1 tablet (0.5 mg total) by mouth at bedtime as needed for sleep.  30 tablet  3   .  atorvastatin (LIPITOR) 10 MG tablet  Take 1 tablet (10 mg total) by mouth daily.  30 tablet  5   .  b complex  vitamins tablet  Take 1 tablet by mouth daily.     .  cyanocobalamin (,VITAMIN B-12,) 1000 MCG/ML injection  Inject 1,000 mcg into the muscle every 30 (thirty) days.     .  ferrous sulfate 325 (65 FE) MG tablet  Take 325 mg by mouth daily with breakfast.     .  fluticasone (FLONASE) 50 MCG/ACT nasal spray  instill 2 sprays into each nostril once daily  16 g  5   .  glucose blood (ONE TOUCH TEST STRIPS) test strip  Use as instructed to test blood sugar once daily or as directed  100 each  3   .  JARDIANCE 25 MG TABS tablet  Take 1 tablet by mouth daily.   1   .  lisinopril (PRINIVIL,ZESTRIL) 5 MG tablet  Take 1 tablet (5 mg total) by mouth daily.  30 tablet  5   .  mirtazapine (REMERON) 15 MG tablet  Take 45 mg by mouth at bedtime.     [name]  omeprazole (PRILOSEC) 20 MG capsule  Take 1 capsule (20 mg total) by mouth daily.  30 capsule  5   .  ONETOUCH DELICA LANCETS FINE MISC  1 Device by Does not apply route as directed. Use to test blood sugar once daily or as directed  100 each  3   .  rOPINIRole (REQUIP) 1 MG tablet  1 pill in the am as needed by mouth and 3 pills at bedtime by mouth  120 tablet  5   .  sertraline (ZOLOFT) 50 MG tablet  Take 50 mg by mouth daily.      No current facility-administered medications for this visit.    Review of Systems  Review of Systems  Constitutional: Negative.  Respiratory: Negative.  Cardiovascular: Negative.   Blood pressure  134/78, pulse 78, resp. rate 12, height '5\' 6"'$  (1.676 m).  Physical Exam  Physical Exam  Constitutional: She is oriented to person, place, and time. She appears well-developed and well-nourished.  Eyes: Conjunctivae are normal. No scleral icterus.  Neck: Neck supple. No thyromegaly present.    Cardiovascular: Normal rate, regular rhythm and normal heart sounds.    Pulmonary/Chest: Effort normal and breath sounds normal. Right breast exhibits no inverted nipple, no mass, no nipple discharge, no skin change and no tenderness. Left breast exhibits mass. Left breast exhibits no inverted nipple, no nipple discharge, no skin change and no tenderness.    5 cm mass in left breast upper outer quadrant  Right breast-well healed scar upper outer quadrant  Abdominal: Soft. Bowel sounds are normal.  Lymphadenopathy:  She has no cervical adenopathy.  She has no axillary adenopathy.  Neurological: She is alert and oriented to person, place, and time.  Skin: Skin is warm and dry.  Psychiatric: Her behavior is normal.   Data Reviewed  January 2017 mammograms and ultrasound reviewed.  PET scan and MUGA scan reviewed. No evidence of obvious metastatic disease. Normal cardiac function.  Pathology showed invasive grade 3 mammary carcinoma. ER/PR/HER-2/neu negative. Lymph nodes 2 sampled, negative.  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   Plan   The patient is a candidate for neoadjuvant chemotherapy. She is well versed in the risks of PowerPort placement from her previous experience 10 years ago.   The risks associated with central venous access including arterial, pulmonary and venous injury were reviewed with patient. The possible need for additional treatment if pulmonary injury occurs (chest tube  placement) was discussed.  Patient's surgery has been scheduled for 03-12-15 at [facility].  PCP: [name] A  This information has been scribed by [employer]  03/11/2015, 12:04 PM

## 2015-03-11 NOTE — Patient Instructions (Signed)
  Your procedure is scheduled on: 03-12-15 Report to [place] To find out your arrival time please call [phone] between 1PM - 3PM on 03-11-15  Remember: Instructions that are not followed completely may result in serious medical risk, up to and including death, or upon the discretion of your surgeon and anesthesiologist your surgery may need to be rescheduled.    _X__ 1. Do not eat food or drink liquids after midnight. No gum chewing or hard candies.     _X__ 2. No Alcohol for 24 hours before or after surgery.   ____ 3. Bring all medications with you on the day of surgery if instructed.    _X__ 4. Notify your doctor if there is any change in your medical condition     (cold, fever, infections).     Do not wear jewelry, make-up, hairpins, clips or nail polish.  Do not wear lotions, powders, or perfumes. You may wear deodorant.  Do not shave 48 hours prior to surgery. Men may shave face and neck.  Do not bring valuables to the hospital.    [facility] is not responsible for any belongings or valuables.               Contacts, dentures or bridgework may not be worn into surgery.  Leave your suitcase in the car. After surgery it may be brought to your room.  For patients admitted to the hospital, discharge time is determined by your treatment team.   Patients discharged the day of surgery will not be allowed to drive home.   Please read over the following fact sheets that you were given:     _X__ Take these medicines the morning of surgery with A SIP OF WATER:    1. PRILOSEC  2. LISNOPRIL  3. ZOLOFT  4.  5.  6.  ____ Fleet Enema (as directed)   ____ Use CHG Soap as directed  ____ Use inhalers on the day of surgery  ____ Stop metformin 2 days prior to surgery    ____ Take 1/2 of usual insulin dose the night before surgery and none on the morning of surgery.   ____ Stop Coumadin/Plavix/aspirin-N/A  ____ Stop Anti-inflammatories   ____  Stop supplements until after surgery.    ____ Bring C-Pap to the hospital.

## 2015-03-11 NOTE — Progress Notes (Signed)
Patient ID: Suzanne Strong, female   DOB: [birth year], 70 y.o.   MRN: [id number]  Chief Complaint  Patient presents with  . Other    breast cancer    HPI Suzanne Strong is a 70 y.o. female here today for a evaluation of a port placement and an evaluation of a left breast cancer. Patient states this was found during a self breast check about one month ago, denies tenderness/pain. The patient had not noticed any problem in this area one month earlier. Patient had a mammogram and ultrasound biopsy was done on 02/25/15 saw Dr. Oliva Bustard after pathology was discussed.  Patient has recently undergone a PET scan and MUGA scan.  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  I personally reviewed the patient's history. HPI  Past Medical History  Diagnosis Date  . Hyperlipidemia   . Diverticulosis   . Shingles     Hx of  . Gastroparesis     related to previous radiation tx  . Anxiety   . Anemia     iron deficiency and B12 deficiency  . Cervical dysplasia     conization  . Diabetes mellitus without complication ([place])   . 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[place]) 2006    Past Surgical History  Procedure Laterality Date  . Breast surgery      breast biopsy benign  . Appendectomy    . Tonsillectomy      cancer treated with chemo and radiation  . Cholecystectomy    . Mole removal    . Esophagogastroduodenoscopy  07/2009    normal with few gastric polyps  . Breast cyst aspiration    . Breast biopsy Left 02/25/2015    path pending    Family History  Problem Relation Age of Onset  . Osteoporosis Mother   . Hyperlipidemia Mother   . Depression Mother   . Cancer Mother     skin cancer ? basal cell and lung ca heavy smoker  . Alcohol abuse Father   . Cancer Father     skin CA ? basal cell  . Heart disease Father     CHF  . Hyperlipidemia Brother   . Hypertension Brother      Social History Social History  Substance Use Topics  . Smoking status: Former Research scientist (life sciences)  . Smokeless tobacco: Never Used  . Alcohol Use: No    Allergies  Allergen Reactions  . Amoxicillin     REACTION: rash  . Fluconazole     REACTION: hives  . Gabapentin     depression    Current Outpatient Prescriptions  Medication Sig Dispense Refill  . ALPRAZolam (XANAX) 0.5 MG tablet Take 1 tablet (0.5 mg total) by mouth at bedtime as needed for sleep. 30 tablet 3  . atorvastatin (LIPITOR) 10 MG tablet Take 1 tablet (10 mg total) by mouth daily. 30 tablet 5  . b complex vitamins tablet Take 1 tablet by mouth daily.      . cyanocobalamin (,VITAMIN B-12,) 1000 MCG/ML injection Inject 1,000 mcg into the muscle every 30 (thirty) days.      . ferrous sulfate 325 (65 FE) MG tablet Take 325 mg by mouth daily with breakfast.      . fluticasone (FLONASE) 50 MCG/ACT nasal spray instill 2 sprays into each nostril once daily 16 g 5  . glucose blood (ONE TOUCH TEST STRIPS) test strip Use as instructed to test  blood sugar once daily or as directed 100 each 3  . JARDIANCE 25 MG TABS tablet Take 1 tablet by mouth daily.  1  . lisinopril (PRINIVIL,ZESTRIL) 5 MG tablet Take 1 tablet (5 mg total) by mouth daily. 30 tablet 5  . mirtazapine (REMERON) 15 MG tablet Take 45 mg by mouth at bedtime.     [name] omeprazole (PRILOSEC) 20 MG capsule Take 1 capsule (20 mg total) by mouth daily. 30 capsule 5  . ONETOUCH DELICA LANCETS FINE MISC 1 Device by Does not apply route as directed. Use to test blood sugar once daily or as directed 100 each 3  . rOPINIRole (REQUIP) 1 MG tablet 1 pill in the am as needed by mouth and 3 pills at bedtime by mouth 120 tablet 5  . sertraline (ZOLOFT) 50 MG tablet Take 50 mg by mouth daily.     No current facility-administered medications for this visit.    Review of Systems Review of Systems  Constitutional: Negative.   Respiratory: Negative.   Cardiovascular: Negative.     Blood  pressure 134/78, pulse 78, resp. rate 12, height _0  (1.676 m).  Physical Exam Physical Exam  Constitutional: She is oriented to person, place, and time. She appears well-developed and well-nourished.  Eyes: Conjunctivae are normal. No scleral icterus.  Neck: Neck supple. No thyromegaly present.    Cardiovascular: Normal rate, regular rhythm and normal heart sounds.     Pulmonary/Chest: Effort normal and breath sounds normal. Right breast exhibits no inverted nipple, no mass, no nipple discharge, no skin change and no tenderness. Left breast exhibits mass. Left breast exhibits no inverted nipple, no nipple discharge, no skin change and no tenderness.    5 cm mass in left breast upper outer quadrant  Right breast-well healed scar upper outer quadrant  Abdominal: Soft. Bowel sounds are normal.  Lymphadenopathy:    She has no cervical adenopathy.    She has no axillary adenopathy.  Neurological: She is alert and oriented to person, place, and time.  Skin: Skin is warm and dry.  Psychiatric: Her behavior is normal.    Data Reviewed January 2017 mammograms and ultrasound reviewed.  PET scan and MUGA scan reviewed. No evidence of obvious metastatic disease. Normal cardiac function.  Pathology showed invasive grade 3 mammary carcinoma. ER/PR/HER-2/neu negative. Lymph nodes 2 sampled, negative.   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    Plan    The patient is a candidate for neoadjuvant chemotherapy. She is well versed in the risks of PowerPort placement from her previous experience 10 years ago.    The risks associated with central venous access including arterial, pulmonary and venous injury were reviewed with patient. The possible need for additional treatment if pulmonary injury occurs (chest tube placement) was discussed.  Patient's surgery has been scheduled for 03-12-15 at [facility].  PCP:  [name] A  This information has been scribed  by [employer] 03/11/2015, 12:04 PM

## 2015-03-12 ENCOUNTER — Ambulatory Visit
Admission: RE | Admit: 2015-03-12 | Discharge: 2015-03-12 | Disposition: A | Discharge status: Home / Self Care | Payer: Medicare Other | Source: Ambulatory Visit | Attending: General Surgery | Admitting: General Surgery

## 2015-03-12 ENCOUNTER — Ambulatory Visit: Payer: Medicare Other

## 2015-03-12 ENCOUNTER — Encounter
Admission: RE | Disposition: A | Discharge status: Home / Self Care | Payer: Self-pay | Source: Ambulatory Visit | Attending: General Surgery

## 2015-03-12 ENCOUNTER — Other Ambulatory Visit: Payer: Self-pay | Admitting: *Deleted

## 2015-03-12 ENCOUNTER — Ambulatory Visit: Payer: Medicare Other | Admitting: Anesthesiology

## 2015-03-12 DIAGNOSIS — C50912 Malignant neoplasm of unspecified site of left female breast: Secondary | ICD-10-CM | POA: Insufficient documentation

## 2015-03-12 DIAGNOSIS — E119 Type 2 diabetes mellitus without complications: Secondary | ICD-10-CM | POA: Diagnosis not present

## 2015-03-12 DIAGNOSIS — Z881 Allergy status to other antibiotic agents status: Secondary | ICD-10-CM | POA: Diagnosis not present

## 2015-03-12 DIAGNOSIS — D509 Iron deficiency anemia, unspecified: Secondary | ICD-10-CM | POA: Insufficient documentation

## 2015-03-12 DIAGNOSIS — Z8262 Family history of osteoporosis: Secondary | ICD-10-CM | POA: Diagnosis not present

## 2015-03-12 DIAGNOSIS — Z87891 Personal history of nicotine dependence: Secondary | ICD-10-CM | POA: Diagnosis not present

## 2015-03-12 DIAGNOSIS — C50112 Malignant neoplasm of central portion of left female breast: Secondary | ICD-10-CM

## 2015-03-12 DIAGNOSIS — D649 Anemia, unspecified: Secondary | ICD-10-CM | POA: Diagnosis not present

## 2015-03-12 DIAGNOSIS — E538 Deficiency of other specified B group vitamins: Secondary | ICD-10-CM | POA: Insufficient documentation

## 2015-03-12 DIAGNOSIS — Z888 Allergy status to other drugs, medicaments and biological substances status: Secondary | ICD-10-CM | POA: Diagnosis not present

## 2015-03-12 DIAGNOSIS — Z801 Family history of malignant neoplasm of trachea, bronchus and lung: Secondary | ICD-10-CM | POA: Insufficient documentation

## 2015-03-12 DIAGNOSIS — F419 Anxiety disorder, unspecified: Secondary | ICD-10-CM | POA: Insufficient documentation

## 2015-03-12 DIAGNOSIS — Z452 Encounter for adjustment and management of vascular access device: Secondary | ICD-10-CM | POA: Diagnosis not present

## 2015-03-12 DIAGNOSIS — E785 Hyperlipidemia, unspecified: Secondary | ICD-10-CM | POA: Insufficient documentation

## 2015-03-12 DIAGNOSIS — Z8249 Family history of ischemic heart disease and other diseases of the circulatory system: Secondary | ICD-10-CM | POA: Insufficient documentation

## 2015-03-12 DIAGNOSIS — Z79899 Other long term (current) drug therapy: Secondary | ICD-10-CM | POA: Diagnosis not present

## 2015-03-12 DIAGNOSIS — Z9049 Acquired absence of other specified parts of digestive tract: Secondary | ICD-10-CM | POA: Diagnosis not present

## 2015-03-12 DIAGNOSIS — C50412 Malignant neoplasm of upper-outer quadrant of left female breast: Secondary | ICD-10-CM | POA: Diagnosis not present

## 2015-03-12 DIAGNOSIS — K5790 Diverticulosis of intestine, part unspecified, without perforation or abscess without bleeding: Secondary | ICD-10-CM | POA: Insufficient documentation

## 2015-03-12 DIAGNOSIS — Z808 Family history of malignant neoplasm of other organs or systems: Secondary | ICD-10-CM | POA: Diagnosis not present

## 2015-03-12 DIAGNOSIS — Z95828 Presence of other vascular implants and grafts: Secondary | ICD-10-CM

## 2015-03-12 DIAGNOSIS — K3184 Gastroparesis: Secondary | ICD-10-CM | POA: Insufficient documentation

## 2015-03-12 DIAGNOSIS — Z7984 Long term (current) use of oral hypoglycemic drugs: Secondary | ICD-10-CM | POA: Diagnosis not present

## 2015-03-12 DIAGNOSIS — Z832 Family history of diseases of the blood and blood-forming organs and certain disorders involving the immune mechanism: Secondary | ICD-10-CM | POA: Diagnosis not present

## 2015-03-12 DIAGNOSIS — I10 Essential (primary) hypertension: Secondary | ICD-10-CM | POA: Diagnosis not present

## 2015-03-12 DIAGNOSIS — Z85819 Personal history of malignant neoplasm of unspecified site of lip, oral cavity, and pharynx: Secondary | ICD-10-CM | POA: Diagnosis not present

## 2015-03-12 HISTORY — PX: PORTACATH PLACEMENT: SHX2246

## 2015-03-12 HISTORY — DX: Gastro-esophageal reflux disease without esophagitis: K21.9

## 2015-03-12 HISTORY — DX: Essential (primary) hypertension: I10

## 2015-03-12 LAB — GLUCOSE, CAPILLARY
GLUCOSE-CAPILLARY: 147 mg/dL — AB (ref 65–99)
Glucose-Capillary: 128 mg/dL — ABNORMAL HIGH (ref 65–99)

## 2015-03-12 LAB — POCT CBG (FASTING - GLUCOSE)-MANUAL ENTRY: GLUCOSE FASTING, POC: 147 mg/dL — AB (ref 70–99)

## 2015-03-12 SURGERY — INSERTION, TUNNELED CENTRAL VENOUS DEVICE, WITH PORT
Anesthesia: Monitor Anesthesia Care | Site: Chest | Laterality: Right | Wound class: Clean

## 2015-03-12 MED ORDER — MIDAZOLAM HCL 2 MG/2ML IJ SOLN
INTRAMUSCULAR | Status: DC | PRN
  Administered 2015-03-12: 2 mg via INTRAVENOUS

## 2015-03-12 MED ORDER — HYDROCODONE-ACETAMINOPHEN 5-325 MG PO TABS: 1.0000 | ORAL_TABLET | ORAL | Status: DC | PRN

## 2015-03-12 MED ORDER — CEFAZOLIN SODIUM-DEXTROSE 2-3 GM-% IV SOLR
2.0000 g | INTRAVENOUS | Status: AC
  Administered 2015-03-12: 2 g via INTRAVENOUS

## 2015-03-12 MED ORDER — EPHEDRINE SULFATE 50 MG/ML IJ SOLN
INTRAMUSCULAR | Status: DC | PRN
  Administered 2015-03-12 (×3): 10 mg via INTRAVENOUS

## 2015-03-12 MED ORDER — PROPOFOL 500 MG/50ML IV EMUL
INTRAVENOUS | Status: DC | PRN
  Administered 2015-03-12: 75 ug/kg/min via INTRAVENOUS

## 2015-03-12 MED ORDER — CEFAZOLIN SODIUM-DEXTROSE 2-3 GM-% IV SOLR
INTRAVENOUS | Status: AC
  Administered 2015-03-12: 2 g via INTRAVENOUS
  Filled 2015-03-12: qty 50

## 2015-03-12 MED ORDER — SODIUM CHLORIDE 0.9 % IJ SOLN
INTRAMUSCULAR | Status: DC | PRN
  Administered 2015-03-12: 20 mL

## 2015-03-12 MED ORDER — LIDOCAINE HCL (PF) 1 % IJ SOLN
INTRAMUSCULAR | Status: AC
  Filled 2015-03-12: qty 30

## 2015-03-12 MED ORDER — SODIUM CHLORIDE 0.9 % IJ SOLN
INTRAMUSCULAR | Status: AC
  Filled 2015-03-12: qty 50

## 2015-03-12 MED ORDER — ONDANSETRON HCL 4 MG/2ML IJ SOLN: 4.0000 mg | Freq: Once | INTRAMUSCULAR | Status: DC | PRN

## 2015-03-12 MED ORDER — LIDOCAINE HCL (PF) 1 % IJ SOLN
INTRAMUSCULAR | Status: DC | PRN
  Administered 2015-03-12: 10 mL

## 2015-03-12 MED ORDER — FENTANYL CITRATE (PF) 100 MCG/2ML IJ SOLN
INTRAMUSCULAR | Status: DC | PRN
  Administered 2015-03-12: 100 ug via INTRAVENOUS

## 2015-03-12 MED ORDER — FENTANYL CITRATE (PF) 100 MCG/2ML IJ SOLN
25.0000 ug | INTRAMUSCULAR | Status: DC | PRN
Start: 2015-03-12 — End: 2015-03-12

## 2015-03-12 MED ORDER — SODIUM CHLORIDE 0.9 % IV SOLN
INTRAVENOUS | Status: DC
  Administered 2015-03-12: 10:00:00 via INTRAVENOUS

## 2015-03-12 MED ORDER — ONDANSETRON HCL 4 MG/2ML IJ SOLN
INTRAMUSCULAR | Status: DC | PRN
  Administered 2015-03-12: 4 mg via INTRAVENOUS

## 2015-03-12 SURGICAL SUPPLY — 28 items
BLADE SURG 15 STRL SS SAFETY (BLADE) ×3 IMPLANT
CHLORAPREP W/TINT 26ML (MISCELLANEOUS) ×3 IMPLANT
CLOSURE WOUND 1/2 X4 (GAUZE/BANDAGES/DRESSINGS) ×1
COVER LIGHT HANDLE STERIS (MISCELLANEOUS) ×6 IMPLANT
DECANTER SPIKE VIAL GLASS SM (MISCELLANEOUS) IMPLANT
DRAPE C-ARM XRAY 36X54 (DRAPES) ×3 IMPLANT
DRAPE LAPAROTOMY TRNSV 106X77 (MISCELLANEOUS) ×3 IMPLANT
DRESSING TELFA 4X3 1S ST N-ADH (GAUZE/BANDAGES/DRESSINGS) ×3 IMPLANT
DRSG TEGADERM 2-3/8X2-3/4 SM (GAUZE/BANDAGES/DRESSINGS) ×3 IMPLANT
DRSG TEGADERM 4X4.75 (GAUZE/BANDAGES/DRESSINGS) ×3 IMPLANT
ELECT REM PT RETURN 9FT ADLT (ELECTROSURGICAL) ×3
ELECTRODE REM PT RTRN 9FT ADLT (ELECTROSURGICAL) ×1 IMPLANT
GLOVE BIO SURGEON STRL SZ7.5 (GLOVE) ×15 IMPLANT
GLOVE INDICATOR 8.0 STRL GRN (GLOVE) ×3 IMPLANT
GOWN STRL REUS W/ TWL LRG LVL3 (GOWN DISPOSABLE) ×3 IMPLANT
GOWN STRL REUS W/TWL LRG LVL3 (GOWN DISPOSABLE) ×6
KIT PORT POWER 8FR ISP CVUE (Catheter) ×3 IMPLANT
KIT RM TURNOVER STRD PROC AR (KITS) ×3 IMPLANT
LABEL OR SOLS (LABEL) ×3 IMPLANT
NS IRRIG 500ML POUR BTL (IV SOLUTION) ×3 IMPLANT
PACK PORT-A-CATH (MISCELLANEOUS) ×3 IMPLANT
STRIP CLOSURE SKIN 1/2X4 (GAUZE/BANDAGES/DRESSINGS) ×2 IMPLANT
SUT PROLENE 3 0 SH DA (SUTURE) ×3 IMPLANT
SUT VIC AB 3-0 SH 27 (SUTURE) ×3
SUT VIC AB 3-0 SH 27X BRD (SUTURE) ×1 IMPLANT
SUT VIC AB 4-0 FS2 27 (SUTURE) ×3 IMPLANT
SWABSTK COMLB BENZOIN TINCTURE (MISCELLANEOUS) ×3 IMPLANT
SYR CONTROL 10ML (SYRINGE) ×6 IMPLANT

## 2015-03-12 NOTE — OR Nursing (Signed)
Clarified orders and Ancef order with Dr Bary Castilla

## 2015-03-12 NOTE — Transfer of Care (Signed)
Immediate Anesthesia Transfer of Care Note  Patient: Suzanne Strong  Procedure(s) Performed: Procedure(s): INSERTION PORT-A-CATH (Right)  Patient Location: PACU  Anesthesia Type:MAC  Level of Consciousness: awake, alert , oriented and patient cooperative  Airway & Oxygen Therapy: Patient Spontanous Breathing  Post-op Assessment: Report given to RN, Post -op Vital signs reviewed and stable and Patient moving all extremities  Post vital signs: Reviewed and stable  Last Vitals:  Filed Vitals:   03/12/15 1003 03/12/15 1128  BP: 159/90 134/50  Pulse: 84 78  Temp: 35.6 C 36.5 C  Resp: 16 9    Complications: No apparent anesthesia complications

## 2015-03-12 NOTE — H&P (Signed)
No change in clinical history or exam since yesterday. For right subclavian power port placement.

## 2015-03-12 NOTE — Op Note (Signed)
Preoperative diagnosis: Left breast cancer, candidate for neoadjuvant chemotherapy.  Postoperative diagnosis: Same.  Operative procedure: Placement of right subclavian PowerPort with ultrasound guidance.  Operating surgeon: Ollen Bowl, M.D.  Anesthesia: Attended local, 10 mL 1% plain Xylocaine.  Estimated blood loss: Minimal.  Clinical note: This 70 year old woman recently identified with a T3 carcinoma of the left breast. She is candidate for neoadjuvant chemotherapy. Central venous access was requested by her treating oncologist.  Operative note: With the patient under adequate sedation the area was prepped with ChloraPrep and draped. The patient was placed in Trendelenburg position. Ultrasound was used to confirm location and patency of the subclavian vein on the right side. Local anesthesia was infiltrated and the vein was cannulated on a single stick. The guidewire was advanced into the distal SVC followed by placement of the dilator. The catheter was inserted and drawn back into the junction of the SVC and right atrium. This was then tunneled to a pocket on the right anterior chest were the catheter was connected to the port itself. The port was transfixed to the deep tissue with 2, 3-0 Prolene sutures. The port easily irrigated and aspirated. The adipose layer was approximated with a running 3-0 Vicryl suture. The skin was closed with a running 4-0 Vicryl subcuticular suture. Benzoin, Steri-Strips, Telfa and Tegaderm dressings were applied.  The patient was taken to the recovery room in stable condition where a portable chest x-ray is pending.

## 2015-03-12 NOTE — Anesthesia Preprocedure Evaluation (Signed)
Anesthesia Evaluation  Patient identified by MRN, date of birth, ID band Patient awake    Reviewed: Allergy & Precautions, H&P , NPO status , Patient's Chart, lab work & pertinent test results, reviewed documented beta blocker date and time   History of Anesthesia Complications Negative for: history of anesthetic complications  Airway Mallampati: I  TM Distance: >3 FB Neck ROM: full    Dental no notable dental hx. (+) Edentulous Lower, Dental Advidsory Given   Pulmonary neg pulmonary ROS, former smoker,    Pulmonary exam normal breath sounds clear to auscultation       Cardiovascular Exercise Tolerance: Good hypertension, On Medications (-) angina(-) CAD, (-) Past MI, (-) Cardiac Stents and (-) CABG Normal cardiovascular exam(-) dysrhythmias (-) Valvular Problems/Murmurs Rhythm:regular Rate:Normal     Neuro/Psych PSYCHIATRIC DISORDERS (Depression) negative neurological ROS     GI/Hepatic Neg liver ROS, GERD  ,  Endo/Other  diabetes  Renal/GU negative Renal ROS  negative genitourinary   Musculoskeletal   Abdominal   Peds  Hematology negative hematology ROS (+)   Anesthesia Other Findings Past Medical History:   Hyperlipidemia                                               Diverticulosis                                               Shingles                                                       Comment:Hx of   Gastroparesis                                                  Comment:related to previous radiation tx   Anxiety                                                      Anemia                                                         Comment:iron deficiency and B12 deficiency   Cervical dysplasia                                             Comment:conization   Diabetes mellitus without complication ([place])                 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         Hypertension                                                  GERD (gastroesophageal reflux disease)                       Reproductive/Obstetrics negative OB ROS                             Anesthesia Physical Anesthesia Plan  ASA: III  Anesthesia Plan: General   Post-op Pain Management:    Induction:   Airway Management Planned:   Additional Equipment:   Intra-op Plan:   Post-operative Plan:   Informed Consent: I have reviewed the patients History and Physical, chart, labs and discussed the procedure including the risks, benefits and alternatives for the proposed anesthesia with the patient or authorized representative who has indicated his/her understanding and acceptance.   Dental Advisory Given  Plan Discussed with: Anesthesiologist, CRNA and Surgeon  Anesthesia Plan Comments:         Anesthesia Quick Evaluation

## 2015-03-13 ENCOUNTER — Other Ambulatory Visit: Payer: Self-pay | Admitting: Oncology

## 2015-03-13 ENCOUNTER — Telehealth: Payer: Self-pay | Admitting: *Deleted

## 2015-03-13 DIAGNOSIS — C50412 Malignant neoplasm of upper-outer quadrant of left female breast: Secondary | ICD-10-CM | POA: Diagnosis not present

## 2015-03-13 DIAGNOSIS — E119 Type 2 diabetes mellitus without complications: Secondary | ICD-10-CM | POA: Diagnosis not present

## 2015-03-13 MED ORDER — ONDANSETRON HCL 8 MG PO TABS: 8.0000 mg | ORAL_TABLET | Freq: Two times a day (BID) | ORAL | Status: DC | PRN

## 2015-03-13 MED ORDER — LIDOCAINE-PRILOCAINE 2.5-2.5 % EX CREA: TOPICAL_CREAM | CUTANEOUS | Status: DC

## 2015-03-13 NOTE — Telephone Encounter (Signed)
Ondansetron has been approved.  Letter sent to provider and patient.

## 2015-03-14 ENCOUNTER — Inpatient Hospital Stay: Payer: Medicare Other

## 2015-03-14 NOTE — Anesthesia Postprocedure Evaluation (Signed)
Anesthesia Post Note  Patient: Suzanne Strong  Procedure(s) Performed: Procedure(s) (LRB): INSERTION PORT-A-CATH (Right)  Patient location during evaluation: PACU Anesthesia Type: General Level of consciousness: awake and alert Pain management: pain level controlled Vital Signs Assessment: post-procedure vital signs reviewed and stable Respiratory status: spontaneous breathing, nonlabored ventilation, respiratory function stable and patient connected to nasal cannula oxygen Cardiovascular status: blood pressure returned to baseline and stable Postop Assessment: no signs of nausea or vomiting Anesthetic complications: no    Last Vitals:  Filed Vitals:   03/12/15 1216 03/12/15 1233  BP: 151/62 133/55  Pulse: 72   Temp: 36.8 C   Resp:      Last Pain:  Filed Vitals:   03/12/15 1234  PainSc: 0-No pain                 Martha Clan

## 2015-03-18 ENCOUNTER — Inpatient Hospital Stay: Payer: Medicare Other

## 2015-03-18 ENCOUNTER — Encounter: Payer: Self-pay | Admitting: Oncology

## 2015-03-18 ENCOUNTER — Inpatient Hospital Stay (HOSPITAL_BASED_OUTPATIENT_CLINIC_OR_DEPARTMENT_OTHER): Payer: Medicare Other | Admitting: Oncology

## 2015-03-18 VITALS — BP 144/75 | HR 73 | Temp 96.1°F | Resp 18 | Wt 219.1 lb

## 2015-03-18 DIAGNOSIS — E785 Hyperlipidemia, unspecified: Secondary | ICD-10-CM

## 2015-03-18 DIAGNOSIS — K219 Gastro-esophageal reflux disease without esophagitis: Secondary | ICD-10-CM

## 2015-03-18 DIAGNOSIS — C50112 Malignant neoplasm of central portion of left female breast: Secondary | ICD-10-CM

## 2015-03-18 DIAGNOSIS — I10 Essential (primary) hypertension: Secondary | ICD-10-CM

## 2015-03-18 DIAGNOSIS — Z85818 Personal history of malignant neoplasm of other sites of lip, oral cavity, and pharynx: Secondary | ICD-10-CM

## 2015-03-18 DIAGNOSIS — Z87891 Personal history of nicotine dependence: Secondary | ICD-10-CM

## 2015-03-18 DIAGNOSIS — Z9221 Personal history of antineoplastic chemotherapy: Secondary | ICD-10-CM

## 2015-03-18 DIAGNOSIS — C50412 Malignant neoplasm of upper-outer quadrant of left female breast: Secondary | ICD-10-CM

## 2015-03-18 DIAGNOSIS — Z923 Personal history of irradiation: Secondary | ICD-10-CM

## 2015-03-18 DIAGNOSIS — Z5111 Encounter for antineoplastic chemotherapy: Secondary | ICD-10-CM | POA: Diagnosis not present

## 2015-03-18 DIAGNOSIS — E119 Type 2 diabetes mellitus without complications: Secondary | ICD-10-CM

## 2015-03-18 DIAGNOSIS — Z79899 Other long term (current) drug therapy: Secondary | ICD-10-CM

## 2015-03-18 DIAGNOSIS — K5790 Diverticulosis of intestine, part unspecified, without perforation or abscess without bleeding: Secondary | ICD-10-CM

## 2015-03-18 DIAGNOSIS — Z171 Estrogen receptor negative status [ER-]: Secondary | ICD-10-CM | POA: Diagnosis not present

## 2015-03-18 DIAGNOSIS — Z7689 Persons encountering health services in other specified circumstances: Secondary | ICD-10-CM | POA: Diagnosis not present

## 2015-03-18 LAB — COMPREHENSIVE METABOLIC PANEL
ALK PHOS: 90 U/L (ref 38–126)
ALT: 17 U/L (ref 14–54)
AST: 23 U/L (ref 15–41)
Albumin: 3.3 g/dL — ABNORMAL LOW (ref 3.5–5.0)
Anion gap: 7 (ref 5–15)
BUN: 13 mg/dL (ref 6–20)
CALCIUM: 8.3 mg/dL — AB (ref 8.9–10.3)
CHLORIDE: 103 mmol/L (ref 101–111)
CO2: 24 mmol/L (ref 22–32)
CREATININE: 0.65 mg/dL (ref 0.44–1.00)
Glucose, Bld: 171 mg/dL — ABNORMAL HIGH (ref 65–99)
Potassium: 3.3 mmol/L — ABNORMAL LOW (ref 3.5–5.1)
Sodium: 134 mmol/L — ABNORMAL LOW (ref 135–145)
Total Bilirubin: 0.3 mg/dL (ref 0.3–1.2)
Total Protein: 7.1 g/dL (ref 6.5–8.1)

## 2015-03-18 LAB — CBC WITH DIFFERENTIAL/PLATELET
BASOS PCT: 1 %
Basophils Absolute: 0.1 10*3/uL (ref 0–0.1)
EOS ABS: 0.2 10*3/uL (ref 0–0.7)
EOS PCT: 4 %
HCT: 32 % — ABNORMAL LOW (ref 35.0–47.0)
Hemoglobin: 10.5 g/dL — ABNORMAL LOW (ref 12.0–16.0)
LYMPHS ABS: 1 10*3/uL (ref 1.0–3.6)
Lymphocytes Relative: 18 %
MCH: 25.6 pg — AB (ref 26.0–34.0)
MCHC: 32.8 g/dL (ref 32.0–36.0)
MCV: 78.1 fL — ABNORMAL LOW (ref 80.0–100.0)
MONOS PCT: 7 %
Monocytes Absolute: 0.4 10*3/uL (ref 0.2–0.9)
NEUTROS PCT: 70 %
Neutro Abs: 3.7 10*3/uL (ref 1.4–6.5)
PLATELETS: 263 10*3/uL (ref 150–440)
RBC: 4.09 MIL/uL (ref 3.80–5.20)
RDW: 15.7 % — ABNORMAL HIGH (ref 11.5–14.5)
WBC: 5.3 10*3/uL (ref 3.6–11.0)

## 2015-03-18 MED ORDER — SODIUM CHLORIDE 0.9 % IV SOLN
Freq: Once | INTRAVENOUS | Status: AC
  Administered 2015-03-18: 10:00:00 via INTRAVENOUS
  Filled 2015-03-18: qty 1000

## 2015-03-18 MED ORDER — HEPARIN SOD (PORK) LOCK FLUSH 100 UNIT/ML IV SOLN
500.0000 [IU] | Freq: Once | INTRAVENOUS | Status: AC | PRN
  Administered 2015-03-18: 500 [IU]
  Filled 2015-03-18: qty 5

## 2015-03-18 MED ORDER — SODIUM CHLORIDE 0.9 % IV SOLN
600.0000 mg/m2 | Freq: Once | INTRAVENOUS | Status: AC
  Administered 2015-03-18: 1120 mg via INTRAVENOUS
  Filled 2015-03-18: qty 50

## 2015-03-18 MED ORDER — PALONOSETRON HCL INJECTION 0.25 MG/5ML
0.2500 mg | Freq: Once | INTRAVENOUS | Status: AC
  Administered 2015-03-18: 0.25 mg via INTRAVENOUS
  Filled 2015-03-18: qty 5

## 2015-03-18 MED ORDER — SODIUM CHLORIDE 0.9 % IV SOLN
Freq: Once | INTRAVENOUS | Status: AC
  Administered 2015-03-18: 10:00:00 via INTRAVENOUS
  Filled 2015-03-18: qty 5

## 2015-03-18 MED ORDER — DOXORUBICIN HCL CHEMO IV INJECTION 2 MG/ML
60.0000 mg/m2 | Freq: Once | INTRAVENOUS | Status: AC
  Administered 2015-03-18: 112 mg via INTRAVENOUS
  Filled 2015-03-18: qty 56

## 2015-03-18 MED ORDER — PEGFILGRASTIM 6 MG/0.6ML ~~LOC~~ PSKT
6.0000 mg | PREFILLED_SYRINGE | Freq: Once | SUBCUTANEOUS | Status: AC
  Administered 2015-03-18: 6 mg via SUBCUTANEOUS
  Filled 2015-03-18: qty 0.6

## 2015-03-18 MED ORDER — SODIUM CHLORIDE 0.9% FLUSH
10.0000 mL | INTRAVENOUS | Status: DC | PRN
  Administered 2015-03-18 (×2): 10 mL
  Filled 2015-03-18: qty 10

## 2015-03-18 NOTE — Progress Notes (Signed)
[place] @ [facility] [phone]  Fax:([phone]) [place] OB: 04/22/1945  MR#: [id number]  HDQ#:[id number]  Patient Care Team: [name], MD as PCP - General [name], MD (General Surgery) [name], MD (Oncology)  CHIEF COMPLAINT:  Chief Complaint  Patient presents with  . 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  5 CM TUMOR MASS BIOPSIES POSITIVE FOR INVASIVE MAMMARY CARCINOMA TRIPLE NEGATIVE DISEASE(jANUARY, 2017)stage IIIa. 3.  MUGA scan of the heart shows ejection fraction to be 61% (January, 2017) 4  4.  Patient will start chemotherapy with Cytoxan and Adriamycin from March 18, 2015 VISIT DIAGNOSIS:   No diagnosis found.    No history exists.      INTERVAL HISTORY: 70-YEAR-OLD LADY WHO HAD BEEN PREVIOUSLY SEEN BY ME FOR TONSILLAR CANCER STATUS POST RESECTION AND RADIATION AND CHEMOTHERAPY. According to patient she was getting mammograms done on a regular basis however looking at old records do not have any mammogram available for review until January of 2017 Patient felt a mass in the left breast.  Mammogram was abnormal ultrasound revealed 5 cm tumor mass with questionable and suspicious axillary lymph node.  Biopsy of which was positive for invasive mammary carcinoma.  Estrogen and progesterone receptor negative HER-2/neu negative (triple negative disease) even though lymph node biopsy was negative for radiological suspicion was very high of lymph node involvement He should not has been seen by Dr. Caryl Ada .  He did not feel any need for an MRI scan of the breast MUGA scan has been reviewed independently shows ejection fraction to be 61% Proceed with chemotherapy with Cytoxan and  Adriamycin  REVIEW OF SYSTEMS:   GENERAL:  Feels good.  Active.  No fevers, sweats or weight loss. PERFORMANCE STATUS (ECOG):  0-1 HEENT:  No visual changes, runny nose, sore throat, mouth sores or tenderness. had a recurrent sinus infection and and a previous history of tonsillar cancer Lungs: No shortness of breath or cough.  No hemoptysis. Cardiac:  No chest pain, palpitations, orthopnea, or PND. GI:  No nausea, vomiting, diarrhea, constipation, melena or hematochezia. GU:  No urgency, frequency, dysuria, or hematuria. Musculoskeletal:  No back pain.  No joint pain.  No muscle tenderness. Extremities:  No pain or swelling. Skin:  No rashes or skin changes. Neuro:  No headache, numbness or weakness, balance or coordination issues. Endocrine:  No diabetes, thyroid issues, hot flashes or night sweats. Psych:  No mood changes, depression or anxiety. Pain:  No focal pain. Review of systems:  All other systems reviewed and found to be negative.  As per HPI. Otherwise, a complete review of systems is negatve.  PAST MEDICAL HISTORY: Past Medical History  Diagnosis Date  . Hyperlipidemia   . Diverticulosis   . Shingles     Hx of  . Gastroparesis     related to previous radiation tx  . Anxiety   . Anemia     iron deficiency and B12 deficiency  . Cervical dysplasia     conization  . Diabetes mellitus without complication ([place])   . 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[place]) 2006  . Hypertension   . GERD (gastroesophageal reflux disease)     PAST SURGICAL HISTORY: Past Surgical History  Procedure Laterality Date  . Breast surgery  breast biopsy benign  . Appendectomy    . Tonsillectomy      cancer treated with chemo and radiation  . Cholecystectomy    . Mole removal    . Esophagogastroduodenoscopy  07/2009    normal with few gastric polyps  . Breast cyst aspiration    . Breast biopsy Left 02/25/2015    path pending  . Portacath placement    . Port-a-cath removal    . Radical neck  dissection    . Portacath placement Right 03/12/2015    Procedure: INSERTION PORT-A-CATH;  Surgeon: [name], MD;  Location: [facility] ORS;  Service: General;  Laterality: Right;    FAMILY HISTORY Family History  Problem Relation Age of Onset  . Osteoporosis Mother   . Hyperlipidemia Mother   . Depression Mother   . Cancer Mother     skin cancer ? basal cell and lung ca heavy smoker  . Alcohol abuse Father   . Cancer Father     skin CA ? basal cell  . Heart disease Father     CHF  . Hyperlipidemia Brother   . Hypertension Brother    Significant History/PMH:   Hypercholesterolemia:    tonsil cancer:    Sinus Surgery:    breast biopsy-negative: 2000   cone biopsy for cervical dysplasia 1980's:    Cholecystectomy: 2000   Appendectomy: 1970  Preventive Screening:  Has patient had any of the following test?as per patient as he was getting regular mammogram done but we cannot find any record in   [place] breast cancer Mammography (1)  Smoking History: Smoking History quit 1980's (early)(1)  PFSH: Additional Past Medical and Surgical History: Past Medical History  Hypercholesterolemia.    Past Surgical History:  Appendectomy in 1970.  Gallbladder removal in 2000.  Cone biopsy for cervical dysplasia in the early 80's.  Breast biopsy in early 2000 which was negative.    Family History:  There is a family history of heart disease. She denies a family history of any cancer, diabetes, or any bleeding disorders.    Social History:  She is married. She does not have any children. She works at a [employer]. She has a history of smoking about 1 pack every 2 days from 1968-1990. She denies any alcohol use. She denies any recreational drug use. She denies any radiation exposure         ADVANCED DIRECTIVES:    HEALTH MAINTENANCE: Social History  Substance Use Topics  . Smoking status: Former Smoker -- 0.50 packs/day for 6 years    Types:  Cigarettes    Quit date: 03/10/1984  . Smokeless tobacco: Never Used  . Alcohol Use: No      Allergies  Allergen Reactions  . Amoxicillin     REACTION: rash  . Fluconazole     REACTION: hives  . Gabapentin     depression    Current Outpatient Prescriptions  Medication Sig Dispense Refill  . ALPRAZolam (XANAX) 0.5 MG tablet Take 1 tablet (0.5 mg total) by mouth at bedtime as needed for sleep. 30 tablet 3  . atorvastatin (LIPITOR) 10 MG tablet Take 1 tablet (10 mg total) by mouth daily. (Patient taking differently: Take 10 mg by mouth daily at 6 PM. ) 30 tablet 5  . b complex vitamins tablet Take 1 tablet by mouth daily.      . cyanocobalamin (,VITAMIN B-12,) 1000 MCG/ML injection Inject 1,000 mcg into the muscle every 30 (thirty) days.      [name]  ferrous sulfate 325 (65 FE) MG tablet Take 325 mg by mouth daily with breakfast.      . fluticasone (FLONASE) 50 MCG/ACT nasal spray instill 2 sprays into each nostril once daily 16 g 5  . glucose blood (ONE TOUCH TEST STRIPS) test strip Use as instructed to test blood sugar once daily or as directed 100 each 3  . HYDROcodone-acetaminophen (NORCO) 5-325 MG tablet Take 1-2 tablets by mouth every 4 (four) hours as needed. 30 tablet 0  . lisinopril (PRINIVIL,ZESTRIL) 5 MG tablet Take 1 tablet (5 mg total) by mouth daily. (Patient taking differently: Take 5 mg by mouth every morning. ) 30 tablet 5  . mirtazapine (REMERON) 15 MG tablet Take 45 mg by mouth at bedtime.     [name] omeprazole (PRILOSEC) 20 MG capsule Take 1 capsule (20 mg total) by mouth daily. (Patient taking differently: Take 20 mg by mouth at bedtime. ) 30 capsule 5  . ondansetron (ZOFRAN) 8 MG tablet Take 1 tablet (8 mg total) by mouth 2 (two) times daily as needed. Start on the third day after chemotherapy. 30 tablet 1  . ONETOUCH DELICA LANCETS FINE MISC 1 Device by Does not apply route as directed. Use to test blood sugar once daily or as directed 100 each 3  . rOPINIRole (REQUIP) 1 MG  tablet 1 pill in the am as needed by mouth and 3 pills at bedtime by mouth (Patient taking differently: Take 1 mg by mouth. 1 pill in the am as needed by mouth and 3 pills at bedtime by mouth) 120 tablet 5  . sertraline (ZOLOFT) 50 MG tablet Take 50 mg by mouth every morning.     [name] JARDIANCE 25 MG TABS tablet Take 1 tablet by mouth daily. Reported on 03/18/2015  1   No current facility-administered medications for this visit.   Facility-Administered Medications Ordered in Other Visits  Medication Dose Route Frequency Provider Last Rate Last Dose  . heparin lock flush 100 unit/mL  500 Units Intracatheter Once PRN [name], MD      . sodium chloride flush (NS) 0.9 % injection 10 mL  10 mL Intracatheter PRN [name], MD   10 mL at 03/18/15 0827    OBJECTIVE: PHYSICAL EXAM: Gen. Status: Patient is alert oriented not in any acute distress HEENT: Patient previously had tonsillar cancer status post chemoradiation therapy and resection.  There is no evidence of recurrent disease Lymphatic system: Supraclavicular, cervical,  inguinal lymph nodes are not palpable, There might be palpable left axillary lymph node. Examination of the chest was unremarkable. There were no bony deformities, no asymmetry, and no other abnormalities.  Cardiac exam revealed the PMI to be normally situated and sized. The rhythm was regular and no extrasystoles were noted during several minutes of auscultation. The first and second heart sounds were normal and physiologic splitting of the second heart sound was noted. There were no murmurs, rubs, clicks, or gallops. Neurologically, the patient was awake, alert, and oriented to person, place and time. There were no obvious focal neurologic abnormalities. Examination of breasts: Right breast free of masses.  Left breast palpable mass 4.5-5 cm.  There is redness on the skin.  That might be a palpable left axillary lymph node  There were no vitals filed for this visit.    There is no weight on file to calculate BMI.    ECOG FS:1 - Symptomatic but completely ambulatory  LAB RESULTS:     STUDIES: Dg Chest 1 View  03/12/2015  CLINICAL DATA:  Evaluation of port placement postoperative leak EXAM: CHEST 1 VIEW COMPARISON:  Chest x-ray dated November 02, 2011 FINDINGS: The lungs are adequately inflated. There is no pneumothorax or pleural effusion. The Port-A-Cath appliance tip projects over the midportion of the SVC. The heart and pulmonary vascularity are normal. The mediastinum is normal in width. The observed bony thorax is unremarkable. IMPRESSION: Port-A-Cath appliance tip projecting over the midportion of the SVC. There is no postprocedure complication following catheter placement. Electronically Signed   By: David  Martinique M.D.   On: 03/12/2015 13:09   Nm Cardiac Muga Rest  03/07/2015  CLINICAL DATA:  Breast cancer. Evaluate cardiac function in relation to chemotherapy. EXAM: NUCLEAR MEDICINE CARDIAC BLOOD POOL IMAGING (MUGA) TECHNIQUE: Cardiac multi-gated acquisition was performed at rest following intravenous injection of Tc-39mlabeled red blood cells. RADIOPHARMACEUTICALS:  19.4 mCi Tc-981mDP in-vitro labeled red blood cells IV COMPARISON:  PET-CT 03/05/2015 FINDINGS: No  focal wall motion abnormality of the left ventricle. Calculated left ventricular ejection fraction equals 61% IMPRESSION: Left ventricular ejection fraction equals61 %. Electronically Signed   By: StSuzy Bouchard.D.   On: 03/07/2015 11:46   Nm Pet Image Initial (pi) Whole Body  03/05/2015  CLINICAL DATA:  Initial treatment strategy for left breast cancer, left breast biopsy 1 week ago. History of tonsillar cancer. EXAM: NUCLEAR MEDICINE PET WHOLE BODY TECHNIQUE: 12.8 mCi F-18 FDG was injected intravenously. Full-ring PET imaging was performed from the vertex to the feet after the radiotracer. CT data was obtained and used for attenuation correction and anatomic localization. FASTING BLOOD  GLUCOSE:  Value:  140 mg/dl COMPARISON:  03/27/2005 FINDINGS: Head/Neck: High activity is associated with the right palate, maximum standard uptake value 8.1. A subjectively similar appearance of the palate is noted on 03/27/2005. No adenopathy observed in the neck. Incidental ethmoidectomies and bilateral maxillary antrostomies. Chronic left sphenoid sinusitis. Chest: The left lateral breast mass measures 5.9 by 5.3 cm and has a maximum standard uptake value of 21.5. Adjacent axillary lymph nodes are relatively small, for example a lymph node just above the mass measures up to 0.8 cm in short axis with maximum standard uptake value of 2.5. Coronary, aortic arch, and branch vessel atherosclerotic vascular disease. Abdomen/Pelvis: Photopenic cyst in the left upper to mid kidney. Cholecystectomy. No findings of metastatic disease to the abdomen or pelvis. Skeleton: No focal hypermetabolic activity to suggest skeletal metastasis. Extremities: No hypermetabolic activity to suggest metastasis. Sclerosis in the pubic body suggesting osteitis pubis. IMPRESSION: 1. 5.9 cm left lateral breast mass is highly hypermetabolic. Adjacent axillary lymph nodes are not enlarged and are borderline hypermetabolic. No other metastatic lesions identified. 2. High activity in the right palate, but not appreciably changed from 2007, likely benign/incidental and possibly dental related. 3. Prior ethmoidectomies and maxillary antrostomies. Chronic left sphenoid sinusitis. 4. Coronary, aortic arch, and branch vessel atherosclerotic vascular disease. 5. Osteitis pubis. Electronically Signed   By: WaVan Clines.D.   On: 03/05/2015 10:11   Mm Digital Diagnostic Unilat L  02/25/2015  CLINICAL DATA:  Status post ultrasound-guided core biopsy left breast mass EXAM: DIAGNOSTIC LEFT MAMMOGRAM POST ULTRASOUND BIOPSY COMPARISON:  Previous exam(s). FINDINGS: Mammographic images were obtained following left breast ultrasound guided biopsy of  left breast mass. Cc and lateral views of the left breast demonstrate ribbon biopsy clip in the mass. IMPRESSION: Post biopsy mammogram demonstrating biopsy clip in the mass of concern. Final Assessment: Post Procedure Mammograms for Marker Placement Electronically Signed  By: Abelardo Diesel M.D.   On: 02/25/2015 13:30   Dg C-arm 1-60 Min-no Report  03/12/2015  CLINICAL DATA: PORT PLACEMENT C-ARM 1-60 MINUTES Fluoroscopy was utilized by the requesting physician.  No radiographic interpretation.   US Breast Ltd Uni Left Inc Axilla  02/25/2015  CLINICAL DATA:  Palpable lump left breast EXAM: DIGITAL DIAGNOSTIC BILATERAL MAMMOGRAM WITH 3D TOMOSYNTHESIS WITH CAD ULTRASOUND LEFT BREAST COMPARISON:  Previous exam(s). ACR Breast Density Category b: There are scattered areas of fibroglandular density. FINDINGS: Cc and MLO views of bilateral breasts are submitted. There is a irregular mass in the palpable area upper-outer quadrant left breast. Abnormal enlarged left axillary lymph nodes are identified. The right breasts is negative. Mammographic images were processed with CAD. Targeted ultrasound is performed, showing a 5.2 x 4.7 x 5.1 cm irregular hypoechoic mass at the left breast 12:30 o'clock 8 cm from nipple palpable area. In the left axilla, there are abnormal enlarged thickened cortex lymph nodes. IMPRESSION: Highly suspicious findings. RECOMMENDATION: Ultrasound-guided core biopsy of left breast mass and abnormal left axillary lymph node. I have discussed the findings and recommendations with the patient. Results were also provided in writing at the conclusion of the visit. If applicable, a reminder letter will be sent to the patient regarding the next appointment. BI-RADS CATEGORY  5: Highly suggestive of malignancy. Electronically Signed   By: Abelardo Diesel M.D.   On: 02/25/2015 12:24   Mm Diag Breast Tomo Bilateral  02/25/2015  CLINICAL DATA:  Palpable lump left breast EXAM: DIGITAL DIAGNOSTIC BILATERAL  MAMMOGRAM WITH 3D TOMOSYNTHESIS WITH CAD ULTRASOUND LEFT BREAST COMPARISON:  Previous exam(s). ACR Breast Density Category b: There are scattered areas of fibroglandular density. FINDINGS: Cc and MLO views of bilateral breasts are submitted. There is a irregular mass in the palpable area upper-outer quadrant left breast. Abnormal enlarged left axillary lymph nodes are identified. The right breasts is negative. Mammographic images were processed with CAD. Targeted ultrasound is performed, showing a 5.2 x 4.7 x 5.1 cm irregular hypoechoic mass at the left breast 12:30 o'clock 8 cm from nipple palpable area. In the left axilla, there are abnormal enlarged thickened cortex lymph nodes. IMPRESSION: Highly suspicious findings. RECOMMENDATION: Ultrasound-guided core biopsy of left breast mass and abnormal left axillary lymph node. I have discussed the findings and recommendations with the patient. Results were also provided in writing at the conclusion of the visit. If applicable, a reminder letter will be sent to the patient regarding the next appointment. BI-RADS CATEGORY  5: Highly suggestive of malignancy. Electronically Signed   By: Abelardo Diesel M.D.   On: 02/25/2015 12:24   [place] Lt Breast Bx W Loc Dev 1st Lesion Img Bx Spec US Guide  02/26/2015  ADDENDUM REPORT: 02/26/2015 16:52 ADDENDUM: Pathology of the left breast biopsy revealed FRAGMENTS OF INVASIVE MAMMARY CARCINOMA, NO SPECIAL TYPE. PRELIMINARY GRADE: 3 (NOTTINGHAM GRADE). These findings were communicated to Tanya Nones, RN in the Breast Clinic on 02/26/2015 by pathologist. Read back procedure was performed. This was found to be concordant by Dr. Augustin Coupe. Recommendations:  Surgical and oncology referral. The patient was contacted by Jetta Lout, [facility]) on 02/26/15 for a post biopsy check. The patient stated she has done well following the biopsy with no bleeding or bruising at the biopsy site. Post biopsy instructions were reviewed with the  patient. All of her questions were answered. She stated she has been notified by Dr. Glori Bickers of the results. An appointment with Dr. Oliva Bustard, oncology has been  made and relayed to the patient by Tanya Nones, RN. The patient was encouraged to call the [place] [facility] with any further questions or concerns related to the biopsy site. Addendum by Jetta Lout, RRA on 02/26/15. Electronically Signed   By: Abelardo Diesel M.D.   On: 02/26/2015 16:52  02/26/2015  CLINICAL DATA:  Left breast mass for biopsy EXAM: ULTRASOUND GUIDED LEFT BREAST CORE NEEDLE BIOPSY COMPARISON:  Previous exam(s). PROCEDURE: I met with the patient and we discussed the procedure of ultrasound-guided biopsy, including benefits and alternatives. We discussed the high likelihood of a successful procedure. We discussed the risks of the procedure including infection, bleeding, tissue injury, clip migration, and inadequate sampling. Informed written consent was given. The usual time-out protocol was performed immediately prior to the procedure. Using sterile technique and 1% Lidocaine as local anesthetic, under direct ultrasound visualization, a 9 gauge vacuum-assisted device was used to perform biopsy of mass at left breast 12:30 o'clockusing a lateral approach. At the conclusion of the procedure, a ribbon tissue marker clip was deployed into the biopsy cavity. Follow-up 2-view mammogram was performed and dictated separately. IMPRESSION: Ultrasound-guided biopsy of left breast.  No apparent complications. Electronically Signed: By: Abelardo Diesel M.D. On: 02/25/2015 13:38   [place] Lt Breast Bx W Loc Dev Ea Add Lesion Img Bx Spec US Guide  02/27/2015  ADDENDUM REPORT: 02/26/2015 17:01 ADDENDUM: Pathology of the left axillary lymph node revealed LYMPH NODE, NEGATIVE FOR MALIGNANCY. These findings were communicated to Tanya Nones, RN in the Breast Clinic on 02/26/2015 by the pathologist. This was found to be  discordant by Dr. Augustin Coupe. Recommendations: Recommend to consider further sampling as the lymph node is very suspicious on imaging. Needle biopsy of the lymph node is not recommended as this biopsy was in the center of the lymph node. This recommendation was relayed to both Dr. Glori Bickers and Tanya Nones, RN by Jetta Lout, [facility]). The patient was contacted by Jetta Lout, RRA for a post biopsy check on 02/26/15. She stated she has done well following the biopsy with no bleeding or bruising. Post biopsy instructions were reviewed with the patient. All of her questions were answered. She was encouraged to call the [place] [facility] with any further questions or concerns. The patient stated she has been contacted by Dr. Glori Bickers with the results. An appointment has been made for the patient with Dr. Oliva Bustard on Tuesday, 03/05/15 and relayed to the patient by Tanya Nones. Addendum by Jetta Lout, RRA on 02/26/15. Electronically Signed   By: Abelardo Diesel M.D.   On: 02/26/2015 17:01  02/27/2015  CLINICAL DATA:  Abnormal left axillary lymph node for biopsy EXAM: ULTRASOUND GUIDED CORE NEEDLE BIOPSY OF A LEFT AXILLARY NODE COMPARISON:  Previous exam(s). FINDINGS: I met with the patient and we discussed the procedure of ultrasound-guided biopsy, including benefits and alternatives. We discussed the high likelihood of a successful procedure. We discussed the risks of the procedure, including infection, bleeding, tissue injury, clip migration, and inadequate sampling. Informed written consent was given. The usual time-out protocol was performed immediately prior to the procedure. Using sterile technique and 1% Lidocaine as local anesthetic, under direct ultrasound visualization, a 14 gauge spring-loaded device was used to perform biopsy of abnormal left axillary lymph node using a lateral approach. At the conclusion of the procedure no tissue marker clip was deployed into  the biopsy cavity. IMPRESSION: Ultrasound guided biopsy of abnormal left  axillary lymph node. No apparent complications. Electronically Signed: By: Abelardo Diesel M.D. On: 02/25/2015 13:39    ASSESSMENT: carcinoma of breast.  Left upper and outer quadrant tumor size is more than 5 cm Triple negative disease.  PET scan has been reviewed independently so was 5.9 cm left lateral breast mass.. Activity in the ENT area was probably because of previous history of tonsillar cancer but ENT evaluation would be done at some point in time MUGA scan of the heart is 61% which has been reviewed independently Note from that Dr. Dwyane Luo office has been reviewed.  had a port placement All the side effects of chemotherapy including myelosuppression, alopecia, nausea vomiting fatigue weakness.  Secondary infection, and   peripheral neuropathy .  Has been discussed in details. Informal consent has been obtained and will be documented by nurses in the chart Intent of chemotherapy   is   Cure Informed consent was obtained [place] chemotherapy with Cytoxan and Adriamycin  2. Previous history of tonsillar cancer diagnosed in 2006 without any evidence of recurrent disease status post cis-platinum and 5-FU chemotherapy  PET scan has been reviewed.  PLAN:      Chemotherapy with Cytoxan and Adriamycin followed by Taxol and addition of carboplatinum may be considered if tolerated.  We did not find any previous mammogram done at [facility].  So we will call her primary care physician and if find out whether any mammogram has been done at outside institution.  As patient is very sure that the mammogram was done last year  Patient expressed understanding and was in agreement with this plan. She also understands that She can call clinic at any time with any questions, concerns, or complaints.    No matching staging information was found for the patient.  [name], MD   03/18/2015 8:56 AM

## 2015-03-18 NOTE — Patient Instructions (Signed)
Doxorubicin injection What is this medicine? DOXORUBICIN (dox oh ROO bi sin) is a chemotherapy drug. It is used to treat many kinds of cancer like Hodgkin's disease, leukemia, non-Hodgkin's lymphoma, neuroblastoma, sarcoma, and Wilms' tumor. It is also used to treat bladder cancer, breast cancer, lung cancer, ovarian cancer, stomach cancer, and thyroid cancer. This medicine may be used for other purposes; ask your health care provider or pharmacist if you have questions. What should I tell my health care provider before I take this medicine? They need to know if you have any of these conditions: -blood disorders -heart disease, recent heart attack -infection (especially a virus infection such as chickenpox, cold sores, or herpes) -irregular heartbeat -liver disease -recent or ongoing radiation therapy -an unusual or allergic reaction to doxorubicin, other chemotherapy agents, other medicines, foods, dyes, or preservatives -pregnant or trying to get pregnant -breast-feeding How should I use this medicine? This drug is given as an infusion into a vein. It is administered in a hospital or clinic by a specially trained health care professional. If you have pain, swelling, burning or any unusual feeling around the site of your injection, tell your health care professional right away. Talk to your pediatrician regarding the use of this medicine in children. Special care may be needed. Overdosage: If you think you have taken too much of this medicine contact a poison control center or emergency room at once. NOTE: This medicine is only for you. Do not share this medicine with others. What if I miss a dose? It is important not to miss your dose. Call your doctor or health care professional if you are unable to keep an appointment. What may interact with this medicine? Do not take this medicine with any of the following medications: -cisapride -droperidol -halofantrine -pimozide -zidovudine This  medicine may also interact with the following medications: -chloroquine -chlorpromazine -clarithromycin -cyclophosphamide -cyclosporine -erythromycin -medicines for depression, anxiety, or psychotic disturbances -medicines for irregular heart beat like amiodarone, bepridil, dofetilide, encainide, flecainide, propafenone, quinidine -medicines for seizures like ethotoin, fosphenytoin, phenytoin -medicines for nausea, vomiting like dolasetron, ondansetron, palonosetron -medicines to increase blood counts like filgrastim, pegfilgrastim, sargramostim -methadone -methotrexate -pentamidine -progesterone -vaccines -verapamil Talk to your doctor or health care professional before taking any of these medicines: -acetaminophen -aspirin -ibuprofen -ketoprofen -naproxen This list may not describe all possible interactions. Give your health care provider a list of all the medicines, herbs, non-prescription drugs, or dietary supplements you use. Also tell them if you smoke, drink alcohol, or use illegal drugs. Some items may interact with your medicine. What should I watch for while using this medicine? Your condition will be monitored carefully while you are receiving this medicine. You will need important blood work done while you are taking this medicine. This drug may make you feel generally unwell. This is not uncommon, as chemotherapy can affect healthy cells as well as cancer cells. Report any side effects. Continue your course of treatment even though you feel ill unless your doctor tells you to stop. Your urine may turn red for a few days after your dose. This is not blood. If your urine is dark or brown, call your doctor. In some cases, you may be given additional medicines to help with side effects. Follow all directions for their use. Call your doctor or health care professional for advice if you get a fever, chills or sore throat, or other symptoms of a cold or flu. Do not treat yourself.  This drug decreases your body's ability to   fight infections. Try to avoid being around people who are sick. This medicine may increase your risk to bruise or bleed. Call your doctor or health care professional if you notice any unusual bleeding. Be careful brushing and flossing your teeth or using a toothpick because you may get an infection or bleed more easily. If you have any dental work done, tell your dentist you are receiving this medicine. Avoid taking products that contain aspirin, acetaminophen, ibuprofen, naproxen, or ketoprofen unless instructed by your doctor. These medicines may hide a fever. Men and women of childbearing age should use effective birth control methods while using taking this medicine. Do not become pregnant while taking this medicine. There is a potential for serious side effects to an unborn child. Talk to your health care professional or pharmacist for more information. Do not breast-feed an infant while taking this medicine. Do not let others touch your urine or other body fluids for 5 days after each treatment with this medicine. Caregivers should wear latex gloves to avoid touching body fluids during this time. There is a maximum amount of this medicine you should receive throughout your life. The amount depends on the medical condition being treated and your overall health. Your doctor will watch how much of this medicine you receive in your lifetime. Tell your doctor if you have taken this medicine before. What side effects may I notice from receiving this medicine? Side effects that you should report to your doctor or health care professional as soon as possible: -allergic reactions like skin rash, itching or hives, swelling of the face, lips, or tongue -low blood counts - this medicine may decrease the number of white blood cells, red blood cells and platelets. You may be at increased risk for infections and bleeding. -signs of infection - fever or chills, cough,  sore throat, pain or difficulty passing urine -signs of decreased platelets or bleeding - bruising, pinpoint red spots on the skin, black, tarry stools, blood in the urine -signs of decreased red blood cells - unusually weak or tired, fainting spells, lightheadedness -breathing problems -chest pain -fast, irregular heartbeat -mouth sores -nausea, vomiting -pain, swelling, redness at site where injected -pain, tingling, numbness in the hands or feet -swelling of ankles, feet, or hands -unusual bleeding or bruising Side effects that usually do not require medical attention (report to your doctor or health care professional if they continue or are bothersome): -diarrhea -facial flushing -hair loss -loss of appetite -missed menstrual periods -nail discoloration or damage -red or watery eyes -red colored urine -stomach upset This list may not describe all possible side effects. Call your doctor for medical advice about side effects. You may report side effects to FDA at 1-800-FDA-1088. Where should I keep my medicine? This drug is given in a hospital or clinic and will not be stored at home. NOTE: This sheet is a summary. It may not cover all possible information. If you have questions about this medicine, talk to your doctor, pharmacist, or health care provider.    2016, [employer]. (2012-05-31 09:54:34) Cyclophosphamide injection What is this medicine? CYCLOPHOSPHAMIDE (sye kloe FOSS fa mide) is a chemotherapy drug. It slows the growth of cancer cells. This medicine is used to treat many types of cancer like lymphoma, myeloma, leukemia, breast cancer, and ovarian cancer, to name a few. This medicine may be used for other purposes; ask your health care provider or pharmacist if you have questions. What should I tell my health care provider before I take   this medicine? They need to know if you have any of these conditions: -blood disorders -history of other  chemotherapy -infection -kidney disease -liver disease -recent or ongoing radiation therapy -tumors in the bone marrow -an unusual or allergic reaction to cyclophosphamide, other chemotherapy, other medicines, foods, dyes, or preservatives -pregnant or trying to get pregnant -breast-feeding How should I use this medicine? This drug is usually given as an injection into a vein or muscle or by infusion into a vein. It is administered in a hospital or clinic by a specially trained health care professional. Talk to your pediatrician regarding the use of this medicine in children. Special care may be needed. Overdosage: If you think you have taken too much of this medicine contact a poison control center or emergency room at once. NOTE: This medicine is only for you. Do not share this medicine with others. What if I miss a dose? It is important not to miss your dose. Call your doctor or health care professional if you are unable to keep an appointment. What may interact with this medicine? This medicine may interact with the following medications: -amiodarone -amphotericin B -azathioprine -certain antiviral medicines for HIV or AIDS such as protease inhibitors (e.g., indinavir, ritonavir) and zidovudine -certain blood pressure medications such as benazepril, captopril, enalapril, fosinopril, lisinopril, moexipril, monopril, perindopril, quinapril, ramipril, trandolapril -certain cancer medications such as anthracyclines (e.g., daunorubicin, doxorubicin), busulfan, cytarabine, paclitaxel, pentostatin, tamoxifen, trastuzumab -certain diuretics such as chlorothiazide, chlorthalidone, hydrochlorothiazide, indapamide, metolazone -certain medicines that treat or prevent blood clots like warfarin -certain muscle relaxants such as succinylcholine -cyclosporine -etanercept -indomethacin -medicines to increase blood counts like filgrastim, pegfilgrastim, sargramostim -medicines used as general  anesthesia -metronidazole -natalizumab This list may not describe all possible interactions. Give your health care provider a list of all the medicines, herbs, non-prescription drugs, or dietary supplements you use. Also tell them if you smoke, drink alcohol, or use illegal drugs. Some items may interact with your medicine. What should I watch for while using this medicine? Visit your doctor for checks on your progress. This drug may make you feel generally unwell. This is not uncommon, as chemotherapy can affect healthy cells as well as cancer cells. Report any side effects. Continue your course of treatment even though you feel ill unless your doctor tells you to stop. Drink water or other fluids as directed. Urinate often, even at night. In some cases, you may be given additional medicines to help with side effects. Follow all directions for their use. Call your doctor or health care professional for advice if you get a fever, chills or sore throat, or other symptoms of a cold or flu. Do not treat yourself. This drug decreases your body's ability to fight infections. Try to avoid being around people who are sick. This medicine may increase your risk to bruise or bleed. Call your doctor or health care professional if you notice any unusual bleeding. Be careful brushing and flossing your teeth or using a toothpick because you may get an infection or bleed more easily. If you have any dental work done, tell your dentist you are receiving this medicine. You may get drowsy or dizzy. Do not drive, use machinery, or do anything that needs mental alertness until you know how this medicine affects you. Do not become pregnant while taking this medicine or for 1 year after stopping it. Women should inform their doctor if they wish to become pregnant or think they might be pregnant. Men should not father a child   while taking this medicine and for 4 months after stopping it. There is a potential for serious side  effects to an unborn child. Talk to your health care professional or pharmacist for more information. Do not breast-feed an infant while taking this medicine. This medicine may interfere with the ability to have a child. This medicine has caused ovarian failure in some women. This medicine has caused reduced sperm counts in some men. You should talk with your doctor or health care professional if you are concerned about your fertility. If you are going to have surgery, tell your doctor or health care professional that you have taken this medicine. What side effects may I notice from receiving this medicine? Side effects that you should report to your doctor or health care professional as soon as possible: -allergic reactions like skin rash, itching or hives, swelling of the face, lips, or tongue -low blood counts - this medicine may decrease the number of white blood cells, red blood cells and platelets. You may be at increased risk for infections and bleeding. -signs of infection - fever or chills, cough, sore throat, pain or difficulty passing urine -signs of decreased platelets or bleeding - bruising, pinpoint red spots on the skin, black, tarry stools, blood in the urine -signs of decreased red blood cells - unusually weak or tired, fainting spells, lightheadedness -breathing problems -dark urine -dizziness -palpitations -swelling of the ankles, feet, hands -trouble passing urine or change in the amount of urine -weight gain -yellowing of the eyes or skin Side effects that usually do not require medical attention (report to your doctor or health care professional if they continue or are bothersome): -changes in nail or skin color -hair loss -missed menstrual periods -mouth sores -nausea, vomiting This list may not describe all possible side effects. Call your doctor for medical advice about side effects. You may report side effects to FDA at 1-800-FDA-1088. Where should I keep my  medicine? This drug is given in a hospital or clinic and will not be stored at home. NOTE: This sheet is a summary. It may not cover all possible information. If you have questions about this medicine, talk to your doctor, pharmacist, or health care provider.    2016, [employer]. (2011-12-18 16:22:58) Paclitaxel injection What is this medicine? PACLITAXEL (PAK li TAX el) is a chemotherapy drug. It targets fast dividing cells, like cancer cells, and causes these cells to die. This medicine is used to treat ovarian cancer, breast cancer, and other cancers. This medicine may be used for other purposes; ask your health care provider or pharmacist if you have questions. What should I tell my health care provider before I take this medicine? They need to know if you have any of these conditions: -blood disorders -irregular heartbeat -infection (especially a virus infection such as chickenpox, cold sores, or herpes) -liver disease -previous or ongoing radiation therapy -an unusual or allergic reaction to paclitaxel, alcohol, polyoxyethylated castor oil, other chemotherapy agents, other medicines, foods, dyes, or preservatives -pregnant or trying to get pregnant -breast-feeding How should I use this medicine? This drug is given as an infusion into a vein. It is administered in a hospital or clinic by a specially trained health care professional. Talk to your pediatrician regarding the use of this medicine in children. Special care may be needed. Overdosage: If you think you have taken too much of this medicine contact a poison control center or emergency room at once. NOTE: This medicine is only for you. Do   not share this medicine with others. What if I miss a dose? It is important not to miss your dose. Call your doctor or health care professional if you are unable to keep an appointment. What may interact with this medicine? Do not take this medicine with any of the following  medications: -disulfiram -metronidazole This medicine may also interact with the following medications: -cyclosporine -diazepam -ketoconazole -medicines to increase blood counts like filgrastim, pegfilgrastim, sargramostim -other chemotherapy drugs like cisplatin, doxorubicin, epirubicin, etoposide, teniposide, vincristine -quinidine -testosterone -vaccines -verapamil Talk to your doctor or health care professional before taking any of these medicines: -acetaminophen -aspirin -ibuprofen -ketoprofen -naproxen This list may not describe all possible interactions. Give your health care provider a list of all the medicines, herbs, non-prescription drugs, or dietary supplements you use. Also tell them if you smoke, drink alcohol, or use illegal drugs. Some items may interact with your medicine. What should I watch for while using this medicine? Your condition will be monitored carefully while you are receiving this medicine. You will need important blood work done while you are taking this medicine. This drug may make you feel generally unwell. This is not uncommon, as chemotherapy can affect healthy cells as well as cancer cells. Report any side effects. Continue your course of treatment even though you feel ill unless your doctor tells you to stop. This medicine can cause serious allergic reactions. To reduce your risk you will need to take other medicine(s) before treatment with this medicine. In some cases, you may be given additional medicines to help with side effects. Follow all directions for their use. Call your doctor or health care professional for advice if you get a fever, chills or sore throat, or other symptoms of a cold or flu. Do not treat yourself. This drug decreases your body's ability to fight infections. Try to avoid being around people who are sick. This medicine may increase your risk to bruise or bleed. Call your doctor or health care professional if you notice any  unusual bleeding. Be careful brushing and flossing your teeth or using a toothpick because you may get an infection or bleed more easily. If you have any dental work done, tell your dentist you are receiving this medicine. Avoid taking products that contain aspirin, acetaminophen, ibuprofen, naproxen, or ketoprofen unless instructed by your doctor. These medicines may hide a fever. Do not become pregnant while taking this medicine. Women should inform their doctor if they wish to become pregnant or think they might be pregnant. There is a potential for serious side effects to an unborn child. Talk to your health care professional or pharmacist for more information. Do not breast-feed an infant while taking this medicine. Men are advised not to father a child while receiving this medicine. This product may contain alcohol. Ask your pharmacist or healthcare provider if this medicine contains alcohol. Be sure to tell all healthcare providers you are taking this medicine. Certain medicines, like metronidazole and disulfiram, can cause an unpleasant reaction when taken with alcohol. The reaction includes flushing, headache, nausea, vomiting, sweating, and increased thirst. The reaction can last from 30 minutes to several hours. What side effects may I notice from receiving this medicine? Side effects that you should report to your doctor or health care professional as soon as possible: -allergic reactions like skin rash, itching or hives, swelling of the face, lips, or tongue -low blood counts - This drug may decrease the number of white blood cells, red blood cells and platelets.   You may be at increased risk for infections and bleeding. -signs of infection - fever or chills, cough, sore throat, pain or difficulty passing urine -signs of decreased platelets or bleeding - bruising, pinpoint red spots on the skin, black, tarry stools, nosebleeds -signs of decreased red blood cells - unusually weak or tired,  fainting spells, lightheadedness -breathing problems -chest pain -high or low blood pressure -mouth sores -nausea and vomiting -pain, swelling, redness or irritation at the injection site -pain, tingling, numbness in the hands or feet -slow or irregular heartbeat -swelling of the ankle, feet, hands Side effects that usually do not require medical attention (report to your doctor or health care professional if they continue or are bothersome): -bone pain -complete hair loss including hair on your head, underarms, pubic hair, eyebrows, and eyelashes -changes in the color of fingernails -diarrhea -loosening of the fingernails -loss of appetite -muscle or joint pain -red flush to skin -sweating This list may not describe all possible side effects. Call your doctor for medical advice about side effects. You may report side effects to FDA at 1-800-FDA-1088. Where should I keep my medicine? This drug is given in a hospital or clinic and will not be stored at home. NOTE: This sheet is a summary. It may not cover all possible information. If you have questions about this medicine, talk to your doctor, pharmacist, or health care provider.    2016, [employer]. (2014-09-20 13:02:56)  

## 2015-03-25 ENCOUNTER — Inpatient Hospital Stay: Payer: Medicare Other | Attending: Oncology

## 2015-03-25 ENCOUNTER — Encounter: Payer: Self-pay | Admitting: *Deleted

## 2015-03-25 DIAGNOSIS — C773 Secondary and unspecified malignant neoplasm of axilla and upper limb lymph nodes: Secondary | ICD-10-CM | POA: Diagnosis not present

## 2015-03-25 DIAGNOSIS — L658 Other specified nonscarring hair loss: Secondary | ICD-10-CM | POA: Insufficient documentation

## 2015-03-25 DIAGNOSIS — R6883 Chills (without fever): Secondary | ICD-10-CM | POA: Diagnosis not present

## 2015-03-25 DIAGNOSIS — E119 Type 2 diabetes mellitus without complications: Secondary | ICD-10-CM | POA: Diagnosis not present

## 2015-03-25 DIAGNOSIS — E785 Hyperlipidemia, unspecified: Secondary | ICD-10-CM | POA: Insufficient documentation

## 2015-03-25 DIAGNOSIS — Z7689 Persons encountering health services in other specified circumstances: Secondary | ICD-10-CM | POA: Diagnosis not present

## 2015-03-25 DIAGNOSIS — R05 Cough: Secondary | ICD-10-CM | POA: Insufficient documentation

## 2015-03-25 DIAGNOSIS — Z9221 Personal history of antineoplastic chemotherapy: Secondary | ICD-10-CM | POA: Diagnosis not present

## 2015-03-25 DIAGNOSIS — I10 Essential (primary) hypertension: Secondary | ICD-10-CM | POA: Diagnosis not present

## 2015-03-25 DIAGNOSIS — R531 Weakness: Secondary | ICD-10-CM | POA: Diagnosis not present

## 2015-03-25 DIAGNOSIS — Z171 Estrogen receptor negative status [ER-]: Secondary | ICD-10-CM | POA: Diagnosis not present

## 2015-03-25 DIAGNOSIS — Z87891 Personal history of nicotine dependence: Secondary | ICD-10-CM | POA: Insufficient documentation

## 2015-03-25 DIAGNOSIS — Z923 Personal history of irradiation: Secondary | ICD-10-CM | POA: Diagnosis not present

## 2015-03-25 DIAGNOSIS — D701 Agranulocytosis secondary to cancer chemotherapy: Secondary | ICD-10-CM | POA: Diagnosis not present

## 2015-03-25 DIAGNOSIS — C50412 Malignant neoplasm of upper-outer quadrant of left female breast: Secondary | ICD-10-CM | POA: Diagnosis not present

## 2015-03-25 DIAGNOSIS — E538 Deficiency of other specified B group vitamins: Secondary | ICD-10-CM | POA: Insufficient documentation

## 2015-03-25 DIAGNOSIS — Z794 Long term (current) use of insulin: Secondary | ICD-10-CM | POA: Diagnosis not present

## 2015-03-25 DIAGNOSIS — T451X5S Adverse effect of antineoplastic and immunosuppressive drugs, sequela: Secondary | ICD-10-CM | POA: Insufficient documentation

## 2015-03-25 DIAGNOSIS — R5081 Fever presenting with conditions classified elsewhere: Secondary | ICD-10-CM | POA: Diagnosis not present

## 2015-03-25 DIAGNOSIS — D509 Iron deficiency anemia, unspecified: Secondary | ICD-10-CM | POA: Diagnosis not present

## 2015-03-25 DIAGNOSIS — Z85818 Personal history of malignant neoplasm of other sites of lip, oral cavity, and pharynx: Secondary | ICD-10-CM | POA: Diagnosis not present

## 2015-03-25 DIAGNOSIS — R197 Diarrhea, unspecified: Secondary | ICD-10-CM | POA: Diagnosis not present

## 2015-03-25 DIAGNOSIS — Z5111 Encounter for antineoplastic chemotherapy: Secondary | ICD-10-CM | POA: Diagnosis not present

## 2015-03-25 DIAGNOSIS — Z79899 Other long term (current) drug therapy: Secondary | ICD-10-CM | POA: Diagnosis not present

## 2015-03-25 DIAGNOSIS — K219 Gastro-esophageal reflux disease without esophagitis: Secondary | ICD-10-CM | POA: Insufficient documentation

## 2015-03-25 LAB — COMPREHENSIVE METABOLIC PANEL
ALBUMIN: 3.7 g/dL (ref 3.5–5.0)
ALK PHOS: 108 U/L (ref 38–126)
ALT: 20 U/L (ref 14–54)
AST: 30 U/L (ref 15–41)
Anion gap: 7 (ref 5–15)
BILIRUBIN TOTAL: 0.3 mg/dL (ref 0.3–1.2)
BUN: 20 mg/dL (ref 6–20)
CALCIUM: 8.6 mg/dL — AB (ref 8.9–10.3)
CO2: 25 mmol/L (ref 22–32)
CREATININE: 0.81 mg/dL (ref 0.44–1.00)
Chloride: 99 mmol/L — ABNORMAL LOW (ref 101–111)
GFR calc non Af Amer: >60 mL/min (ref >60)
GLUCOSE: 101 mg/dL — AB (ref 65–99)
Potassium: 4.7 mmol/L (ref 3.5–5.1)
SODIUM: 131 mmol/L — AB (ref 135–145)
TOTAL PROTEIN: 7.5 g/dL (ref 6.5–8.1)

## 2015-03-25 LAB — CBC WITH DIFFERENTIAL/PLATELET
BASOS ABS: 0 10*3/uL (ref 0–0.1)
EOS ABS: 0.1 10*3/uL (ref 0–0.7)
Eosinophils Relative: 6 %
HEMATOCRIT: 33.4 % — AB (ref 35.0–47.0)
HEMOGLOBIN: 10.8 g/dL — AB (ref 12.0–16.0)
Lymphocytes Relative: 40 %
Lymphs Abs: 0.5 10*3/uL — ABNORMAL LOW (ref 1.0–3.6)
MCH: 25.1 pg — AB (ref 26.0–34.0)
MCHC: 32.3 g/dL (ref 32.0–36.0)
MCV: 77.6 fL — ABNORMAL LOW (ref 80.0–100.0)
Monocytes Absolute: 0.1 10*3/uL — ABNORMAL LOW (ref 0.2–0.9)
Monocytes Relative: 7 %
Neutro Abs: 0.5 10*3/uL — ABNORMAL LOW (ref 1.4–6.5)
Platelets: 212 10*3/uL (ref 150–440)
RBC: 4.3 MIL/uL (ref 3.80–5.20)
RDW: 16.1 % — ABNORMAL HIGH (ref 11.5–14.5)
WBC: 1.2 10*3/uL — AB (ref 3.6–11.0)

## 2015-03-25 NOTE — Progress Notes (Signed)
  Oncology Nurse Navigator Documentation  Navigator Location: [facility] (03/25/15 1100) Navigator Encounter Type: Telephone (03/25/15 1100)           Patient Visit Type: Follow-up (03/25/15 1100) Treatment Phase: Follow-up (03/25/15 1100)      Called patient to follow-up post chemotherapy.  No answer.  Left message for her to return my call.                      Time Spent with Patient: 15 (03/25/15 1100)

## 2015-04-01 ENCOUNTER — Inpatient Hospital Stay: Payer: Medicare Other

## 2015-04-01 DIAGNOSIS — Z171 Estrogen receptor negative status [ER-]: Secondary | ICD-10-CM | POA: Diagnosis not present

## 2015-04-01 DIAGNOSIS — C50412 Malignant neoplasm of upper-outer quadrant of left female breast: Secondary | ICD-10-CM | POA: Diagnosis not present

## 2015-04-01 DIAGNOSIS — D701 Agranulocytosis secondary to cancer chemotherapy: Secondary | ICD-10-CM | POA: Diagnosis not present

## 2015-04-01 DIAGNOSIS — Z7689 Persons encountering health services in other specified circumstances: Secondary | ICD-10-CM | POA: Diagnosis not present

## 2015-04-01 DIAGNOSIS — Z5111 Encounter for antineoplastic chemotherapy: Secondary | ICD-10-CM | POA: Diagnosis not present

## 2015-04-01 DIAGNOSIS — C773 Secondary and unspecified malignant neoplasm of axilla and upper limb lymph nodes: Secondary | ICD-10-CM | POA: Diagnosis not present

## 2015-04-01 LAB — CBC WITH DIFFERENTIAL/PLATELET
BASOS ABS: 0.1 10*3/uL (ref 0–0.1)
BASOS PCT: 1 %
EOS ABS: 0.1 10*3/uL (ref 0–0.7)
Eosinophils Relative: 1 %
HEMATOCRIT: 33 % — AB (ref 35.0–47.0)
HEMOGLOBIN: 10.6 g/dL — AB (ref 12.0–16.0)
Lymphocytes Relative: 10 %
Lymphs Abs: 0.9 10*3/uL — ABNORMAL LOW (ref 1.0–3.6)
MCH: 25 pg — ABNORMAL LOW (ref 26.0–34.0)
MCHC: 32.3 g/dL (ref 32.0–36.0)
MCV: 77.5 fL — ABNORMAL LOW (ref 80.0–100.0)
Monocytes Absolute: 0.4 10*3/uL (ref 0.2–0.9)
Monocytes Relative: 5 %
NEUTROS ABS: 8 10*3/uL — AB (ref 1.4–6.5)
NEUTROS PCT: 83 %
Platelets: 142 10*3/uL — ABNORMAL LOW (ref 150–440)
RBC: 4.25 MIL/uL (ref 3.80–5.20)
RDW: 15.9 % — ABNORMAL HIGH (ref 11.5–14.5)
WBC: 9.4 10*3/uL (ref 3.6–11.0)

## 2015-04-08 ENCOUNTER — Inpatient Hospital Stay: Payer: Medicare Other

## 2015-04-08 ENCOUNTER — Inpatient Hospital Stay (HOSPITAL_BASED_OUTPATIENT_CLINIC_OR_DEPARTMENT_OTHER): Payer: Medicare Other | Admitting: Oncology

## 2015-04-08 ENCOUNTER — Encounter: Payer: Self-pay | Admitting: *Deleted

## 2015-04-08 ENCOUNTER — Encounter: Payer: Self-pay | Admitting: Oncology

## 2015-04-08 ENCOUNTER — Other Ambulatory Visit: Payer: Self-pay | Admitting: *Deleted

## 2015-04-08 VITALS — BP 127/73 | HR 86 | Temp 97.5°F | Resp 18 | Wt 216.1 lb

## 2015-04-08 DIAGNOSIS — Z171 Estrogen receptor negative status [ER-]: Secondary | ICD-10-CM | POA: Diagnosis not present

## 2015-04-08 DIAGNOSIS — E538 Deficiency of other specified B group vitamins: Secondary | ICD-10-CM

## 2015-04-08 DIAGNOSIS — Z79899 Other long term (current) drug therapy: Secondary | ICD-10-CM

## 2015-04-08 DIAGNOSIS — C50412 Malignant neoplasm of upper-outer quadrant of left female breast: Secondary | ICD-10-CM

## 2015-04-08 DIAGNOSIS — Z85818 Personal history of malignant neoplasm of other sites of lip, oral cavity, and pharynx: Secondary | ICD-10-CM | POA: Diagnosis not present

## 2015-04-08 DIAGNOSIS — R197 Diarrhea, unspecified: Secondary | ICD-10-CM

## 2015-04-08 DIAGNOSIS — D509 Iron deficiency anemia, unspecified: Secondary | ICD-10-CM

## 2015-04-08 DIAGNOSIS — Z9221 Personal history of antineoplastic chemotherapy: Secondary | ICD-10-CM

## 2015-04-08 DIAGNOSIS — D701 Agranulocytosis secondary to cancer chemotherapy: Secondary | ICD-10-CM | POA: Diagnosis not present

## 2015-04-08 DIAGNOSIS — C773 Secondary and unspecified malignant neoplasm of axilla and upper limb lymph nodes: Secondary | ICD-10-CM

## 2015-04-08 DIAGNOSIS — E119 Type 2 diabetes mellitus without complications: Secondary | ICD-10-CM

## 2015-04-08 DIAGNOSIS — Z7689 Persons encountering health services in other specified circumstances: Secondary | ICD-10-CM | POA: Diagnosis not present

## 2015-04-08 DIAGNOSIS — Z923 Personal history of irradiation: Secondary | ICD-10-CM

## 2015-04-08 DIAGNOSIS — Z794 Long term (current) use of insulin: Secondary | ICD-10-CM

## 2015-04-08 DIAGNOSIS — Z5111 Encounter for antineoplastic chemotherapy: Secondary | ICD-10-CM | POA: Diagnosis not present

## 2015-04-08 LAB — COMPREHENSIVE METABOLIC PANEL
ALK PHOS: 75 U/L (ref 38–126)
ALT: 18 U/L (ref 14–54)
ANION GAP: 6 (ref 5–15)
AST: 21 U/L (ref 15–41)
Albumin: 3.6 g/dL (ref 3.5–5.0)
BUN: 11 mg/dL (ref 6–20)
CALCIUM: 8.9 mg/dL (ref 8.9–10.3)
CHLORIDE: 107 mmol/L (ref 101–111)
CO2: 25 mmol/L (ref 22–32)
Creatinine, Ser: 0.78 mg/dL (ref 0.44–1.00)
Glucose, Bld: 167 mg/dL — ABNORMAL HIGH (ref 65–99)
Potassium: 3.9 mmol/L (ref 3.5–5.1)
SODIUM: 138 mmol/L (ref 135–145)
Total Bilirubin: 0.2 mg/dL — ABNORMAL LOW (ref 0.3–1.2)
Total Protein: 7.1 g/dL (ref 6.5–8.1)

## 2015-04-08 LAB — CBC WITH DIFFERENTIAL/PLATELET
Basophils Absolute: 0.1 10*3/uL (ref 0–0.1)
Basophils Relative: 1 %
EOS ABS: 0 10*3/uL (ref 0–0.7)
EOS PCT: 0 %
HCT: 33.4 % — ABNORMAL LOW (ref 35.0–47.0)
Hemoglobin: 10.9 g/dL — ABNORMAL LOW (ref 12.0–16.0)
LYMPHS ABS: 0.7 10*3/uL — AB (ref 1.0–3.6)
Lymphocytes Relative: 10 %
MCH: 25.7 pg — AB (ref 26.0–34.0)
MCHC: 32.6 g/dL (ref 32.0–36.0)
MCV: 78.9 fL — ABNORMAL LOW (ref 80.0–100.0)
MONOS PCT: 5 %
Monocytes Absolute: 0.3 10*3/uL (ref 0.2–0.9)
Neutro Abs: 5.9 10*3/uL (ref 1.4–6.5)
Neutrophils Relative %: 84 %
PLATELETS: 261 10*3/uL (ref 150–440)
RBC: 4.23 MIL/uL (ref 3.80–5.20)
RDW: 16.7 % — ABNORMAL HIGH (ref 11.5–14.5)
WBC: 7 10*3/uL (ref 3.6–11.0)

## 2015-04-08 MED ORDER — SODIUM CHLORIDE 0.9 % IV SOLN
Freq: Once | INTRAVENOUS | Status: AC
  Administered 2015-04-08: 10:00:00 via INTRAVENOUS
  Filled 2015-04-08: qty 1000

## 2015-04-08 MED ORDER — DOXORUBICIN HCL CHEMO IV INJECTION 2 MG/ML
60.0000 mg/m2 | Freq: Once | INTRAVENOUS | Status: AC
  Administered 2015-04-08: 112 mg via INTRAVENOUS
  Filled 2015-04-08: qty 56

## 2015-04-08 MED ORDER — PEGFILGRASTIM 6 MG/0.6ML ~~LOC~~ PSKT
6.0000 mg | PREFILLED_SYRINGE | Freq: Once | SUBCUTANEOUS | Status: AC
  Administered 2015-04-08: 6 mg via SUBCUTANEOUS
  Filled 2015-04-08: qty 0.6

## 2015-04-08 MED ORDER — SODIUM CHLORIDE 0.9% FLUSH
10.0000 mL | Freq: Once | INTRAVENOUS | Status: AC
  Administered 2015-04-08: 10 mL via INTRAVENOUS
  Filled 2015-04-08: qty 10

## 2015-04-08 MED ORDER — HEPARIN SOD (PORK) LOCK FLUSH 100 UNIT/ML IV SOLN
500.0000 [IU] | Freq: Once | INTRAVENOUS | Status: AC
  Administered 2015-04-08: 500 [IU] via INTRAVENOUS
  Filled 2015-04-08: qty 5

## 2015-04-08 MED ORDER — PALONOSETRON HCL INJECTION 0.25 MG/5ML
0.2500 mg | Freq: Once | INTRAVENOUS | Status: AC
  Administered 2015-04-08: 0.25 mg via INTRAVENOUS
  Filled 2015-04-08: qty 5

## 2015-04-08 MED ORDER — SODIUM CHLORIDE 0.9 % IV SOLN
Freq: Once | INTRAVENOUS | Status: AC
  Administered 2015-04-08: 10:00:00 via INTRAVENOUS
  Filled 2015-04-08: qty 5

## 2015-04-08 MED ORDER — SODIUM CHLORIDE 0.9 % IV SOLN
600.0000 mg/m2 | Freq: Once | INTRAVENOUS | Status: AC
  Administered 2015-04-08: 1120 mg via INTRAVENOUS
  Filled 2015-04-08: qty 50

## 2015-04-08 NOTE — Progress Notes (Signed)
[place] @ [facility] [phone]  Fax:([phone]) [place] OB: Aug 28, 1945  MR#: [id number]  [id number]  Patient Care Team: [name], MD as PCP - General [name], MD (General Surgery) [name], MD (Oncology)  CHIEF COMPLAINT:  No chief complaint on file.  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  5 CM TUMOR MASS BIOPSIES POSITIVE FOR INVASIVE MAMMARY CARCINOMA TRIPLE NEGATIVE DISEASE(jANUARY, 2017)stage IIIa. 3.  MUGA scan of the heart shows ejection fraction to be 61% (January, 2017) 4  4.  Patient will start chemotherapy with Cytoxan and Adriamycin from March 18, 2015 VISIT DIAGNOSIS:   No diagnosis found.    No history exists.      INTERVAL HISTORY: 70-YEAR-OLD LADY WHO HAD BEEN PREVIOUSLY SEEN BY ME FOR TONSILLAR CANCER STATUS POST RESECTION AND RADIATION AND CHEMOTHERAPY. According to patient she was getting mammograms done on a regular basis however looking at old records do not have any mammogram available for review until January of 2017 Patient is here for the second cycle of chemotherapy Tolerating treatment very well.  Has alopecia.  No evidence of stomatitis at 1 episode of loose stool.  Patient also has noticed significant decrease in the size of the lymph node  REVIEW OF SYSTEMS:   GENERAL:  Feels good.  Active.  No fevers, sweats or weight loss. PERFORMANCE STATUS (ECOG):  0-1 HEENT:  No visual changes, runny nose, sore throat, mouth sores or tenderness.Ihad a recurrent sinus infection and and a previous history of tonsillar cancer Lungs: No shortness of breath or cough.  No hemoptysis. Cardiac:  No chest pain, palpitations, orthopnea, or PND. GI:  No nausea, vomiting, diarrhea,  constipation, melena or hematochezia.[name]  alopecia GU:  No urgency, frequency, dysuria, or hematuria. Musculoskeletal:  No back pain.  No joint pain.  No muscle tenderness. Extremities:  No pain or swelling. Skin:  No rashes or skin changes. Neuro:  No headache, numbness or weakness, balance or coordination issues. Endocrine:  No diabetes, thyroid issues, hot flashes or night sweats. Psych:  No mood changes, depression or anxiety. Pain:  No focal pain. Review of systems:  All other systems reviewed and found to be negative.  As per HPI. Otherwise, a complete review of systems is negatve.  PAST MEDICAL HISTORY: Past Medical History  Diagnosis Date  . Hyperlipidemia   . Diverticulosis   . Shingles     Hx of  . Gastroparesis     related to previous radiation tx  . Anxiety   . Anemia     iron deficiency and B12 deficiency  . Cervical dysplasia     conization  . Diabetes mellitus without complication ([place])   . 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[place]) 2006  . Hypertension   . GERD (gastroesophageal reflux disease)     PAST SURGICAL HISTORY: Past Surgical History  Procedure Laterality Date  . Breast surgery      breast biopsy benign  . Appendectomy    . Tonsillectomy      cancer treated with chemo and radiation  . Cholecystectomy    . Mole removal    . Esophagogastroduodenoscopy  07/2009    normal with few gastric polyps  . Breast cyst aspiration    . Breast biopsy Left 02/25/2015    path pending  . Portacath  placement    . Port-a-cath removal    . Radical neck dissection    . Portacath placement Right 03/12/2015    Procedure: INSERTION PORT-A-CATH;  Surgeon: [name], MD;  Location: [facility] ORS;  Service: General;  Laterality: Right;    FAMILY HISTORY Family History  Problem Relation Age of Onset  . Osteoporosis Mother   . Hyperlipidemia Mother   . Depression Mother   . Cancer Mother     skin cancer ? basal cell and lung ca heavy smoker  . Alcohol abuse Father   . Cancer  Father     skin CA ? basal cell  . Heart disease Father     CHF  . Hyperlipidemia Brother   . Hypertension Brother    Significant History/PMH:   Hypercholesterolemia:    tonsil cancer:    Sinus Surgery:    breast biopsy-negative: 2000   cone biopsy for cervical dysplasia 1980's:    Cholecystectomy: 2000   Appendectomy: 1970  Preventive Screening:  Has patient had any of the following test?as per patient as he was getting regular mammogram done but we cannot find any record in   [place] breast cancer Mammography (1)  Smoking History: Smoking History quit 1980's (early)(1)  PFSH: Additional Past Medical and Surgical History: Past Medical History  Hypercholesterolemia.    Past Surgical History:  Appendectomy in 1970.  Gallbladder removal in 2000.  Cone biopsy for cervical dysplasia in the early 80's.  Breast biopsy in early 2000 which was negative.    Family History:  There is a family history of heart disease. She denies a family history of any cancer, diabetes, or any bleeding disorders.    Social History:  She is married. She does not have any children. She works at a [employer]. She has a history of smoking about 1 pack every 2 days from 1968-1990. She denies any alcohol use. She denies any recreational drug use. She denies any radiation exposure         ADVANCED DIRECTIVES:    HEALTH MAINTENANCE: Social History  Substance Use Topics  . Smoking status: Former Smoker -- 0.50 packs/day for 6 years    Types: Cigarettes    Quit date: 03/10/1984  . Smokeless tobacco: Never Used  . Alcohol Use: No      Allergies  Allergen Reactions  . Amoxicillin     REACTION: rash  . Fluconazole     REACTION: hives  . Gabapentin     depression    Current Outpatient Prescriptions  Medication Sig Dispense Refill  . insulin aspart (NOVOLOG) 100 UNIT/ML FlexPen     . ALPRAZolam (XANAX) 0.5 MG tablet Take 1 tablet (0.5 mg total) by mouth at bedtime  as needed for sleep. 30 tablet 3  . atorvastatin (LIPITOR) 10 MG tablet Take 1 tablet (10 mg total) by mouth daily. (Patient taking differently: Take 10 mg by mouth daily at 6 PM. ) 30 tablet 5  . b complex vitamins tablet Take 1 tablet by mouth daily.      . cyanocobalamin (,VITAMIN B-12,) 1000 MCG/ML injection Inject 1,000 mcg into the muscle every 30 (thirty) days.      . ferrous sulfate 325 (65 FE) MG tablet Take 325 mg by mouth daily with breakfast.      . fluticasone (FLONASE) 50 MCG/ACT nasal spray instill 2 sprays into each nostril once daily 16 g 5  . glipiZIDE (GLUCOTROL) 10 MG tablet Take by mouth.    [name] glucose  blood (ONE TOUCH TEST STRIPS) test strip Use as instructed to test blood sugar once daily or as directed 100 each 3  . HYDROcodone-acetaminophen (NORCO) 5-325 MG tablet Take 1-2 tablets by mouth every 4 (four) hours as needed. 30 tablet 0  . JANUMET XR 50-1000 MG TB24 Take 2 tablets by mouth daily.  0  . JARDIANCE 25 MG TABS tablet Take 1 tablet by mouth daily. Reported on 03/18/2015  1  . lidocaine-prilocaine (EMLA) cream APPLY TO AFFECTED AREA ONCE A DAY  0  . lisinopril (PRINIVIL,ZESTRIL) 5 MG tablet Take 1 tablet (5 mg total) by mouth daily. (Patient taking differently: Take 5 mg by mouth every morning. ) 30 tablet 5  . mirtazapine (REMERON) 15 MG tablet Take 45 mg by mouth at bedtime.     [name] omeprazole (PRILOSEC) 20 MG capsule Take 1 capsule (20 mg total) by mouth daily. (Patient taking differently: Take 20 mg by mouth at bedtime. ) 30 capsule 5  . ondansetron (ZOFRAN) 8 MG tablet Take 1 tablet (8 mg total) by mouth 2 (two) times daily as needed. Start on the third day after chemotherapy. 30 tablet 1  . ONETOUCH DELICA LANCETS FINE MISC 1 Device by Does not apply route as directed. Use to test blood sugar once daily or as directed 100 each 3  . rOPINIRole (REQUIP) 1 MG tablet 1 pill in the am as needed by mouth and 3 pills at bedtime by mouth (Patient taking differently: Take 1  mg by mouth. 1 pill in the am as needed by mouth and 3 pills at bedtime by mouth) 120 tablet 5  . sertraline (ZOLOFT) 100 MG tablet Take 200 mg by mouth daily.  0  . sertraline (ZOLOFT) 50 MG tablet Take 50 mg by mouth every morning.     [name] spironolactone (ALDACTONE) 25 MG tablet Take by mouth.     No current facility-administered medications for this visit.   Facility-Administered Medications Ordered in Other Visits  Medication Dose Route Frequency Provider Last Rate Last Dose  . heparin lock flush 100 unit/mL  500 Units Intravenous Once [name], MD      . sodium chloride flush (NS) 0.9 % injection 10 mL  10 mL Intravenous Once [name], MD        OBJECTIVE: PHYSICAL EXAM: Gen. Status: Patient is alert oriented not in any acute distress HEENT: Patient previously had tonsillar cancer status post chemoradiation therapy and resection.  There is no evidence of recurrent disease Lymphatic system: Supraclavicular, cervical,  inguinal lymph nodes are not palpable, There might be palpable left axillary lymph node. Examination of the chest was unremarkable. There were no bony deformities, no asymmetry, and no other abnormalities.  Cardiac exam revealed the PMI to be normally situated and sized. The rhythm was regular and no extrasystoles were noted during several minutes of auscultation. The first and second heart sounds were normal and physiologic splitting of the second heart sound was noted. There were no murmurs, rubs, clicks, or gallops. Neurologically, the patient was awake, alert, and oriented to person, place and time. There were no obvious focal neurologic abnormalities. Examination of breasts: Right breast free of masses.  Left breast palpable mass 4.5-5 no  redness in the skin..   Palpable lymph node as well as palpable mass in the left breast cyst decrease in size      There is no weight on file to calculate BMI.    ECOG FS:1 - Symptomatic but completely ambulatory  LAB  RESULTS:     STUDIES: Dg Chest 1 View  03/12/2015  CLINICAL DATA:  Evaluation of port placement postoperative leak EXAM: CHEST 1 VIEW COMPARISON:  Chest x-ray dated November 02, 2011 FINDINGS: The lungs are adequately inflated. There is no pneumothorax or pleural effusion. The Port-A-Cath appliance tip projects over the midportion of the SVC. The heart and pulmonary vascularity are normal. The mediastinum is normal in width. The observed bony thorax is unremarkable. IMPRESSION: Port-A-Cath appliance tip projecting over the midportion of the SVC. There is no postprocedure complication following catheter placement. Electronically Signed   By: David  Martinique M.D.   On: 03/12/2015 13:09   Dg C-arm 1-60 Min-no Report  03/12/2015  CLINICAL DATA: PORT PLACEMENT C-ARM 1-60 MINUTES Fluoroscopy was utilized by the requesting physician.  No radiographic interpretation.    ASSESSMENT: carcinoma of breast.  Left upper and outer quadrant tumor size is more than 5 cm Triple negative disease.  PET scan has been reviewed independently so was 5.9 cm left lateral breast mass.. Activity in the ENT area was probably because of previous history of tonsillar cancer but ENT evaluation would be done at some point in time MUGA scan of the heart is 61% which has been reviewed independently Note from that Dr. Dwyane Luo office has been reviewed.  had a port placement All the side effects of chemotherapy including myelosuppression, alopecia, nausea vomiting fatigue weakness.  Secondary infection, and   peripheral neuropathy .  Has been discussed in details. Informal consent has been obtained and will be documented by nurses in the chart Intent of chemotherapy   is   Cure Informed consent was obtained [place] chemotherapy with Cytoxan and Adriamycin  2. Previous history of tonsillar cancer diagnosed in 2006 without any evidence of recurrent disease status post cis-platinum and 5-FU chemotherapy  PET scan has been  reviewed.  PLAN:     continue second cycle of chemotherapy  Alopecia   and one episode of diarrhea no other significant side effects.    Crease in the size of the axillary and   Breast  mass   Lab data has been reviewed  We did not find any previous mammogram done at [facility].  So we will call her primary care physician and if find out whether any mammogram has been done at outside institution.  As patient is very sure that the mammogram was done last year  Patient expressed understanding and was in agreement with this plan. She also understands that She can call clinic at any time with any questions, concerns, or complaints.    No matching staging information was found for the patient.  [name], MD   04/08/2015 8:45 AM

## 2015-04-08 NOTE — Progress Notes (Signed)
  Oncology Nurse Navigator Documentation  Navigator Location: [facility] (04/08/15 1100) Navigator Encounter Type: Treatment (04/08/15 1100)           Patient Visit Type:  (chemo) (04/08/15 1100) Treatment Phase:  (2nd treatment) (04/08/15 1100)                            Time Spent with Patient: 15 (04/08/15 1100)   Met patient today during her 2nd chemotherapy treatment.  States she is doing well.  No needs at this time.

## 2015-04-11 ENCOUNTER — Telehealth: Payer: Self-pay | Admitting: *Deleted

## 2015-04-11 ENCOUNTER — Inpatient Hospital Stay: Payer: Medicare Other

## 2015-04-11 ENCOUNTER — Inpatient Hospital Stay (HOSPITAL_BASED_OUTPATIENT_CLINIC_OR_DEPARTMENT_OTHER): Payer: Medicare Other | Admitting: Family Medicine

## 2015-04-11 ENCOUNTER — Encounter: Payer: Self-pay | Admitting: Family Medicine

## 2015-04-11 ENCOUNTER — Other Ambulatory Visit: Payer: Self-pay | Admitting: *Deleted

## 2015-04-11 VITALS — BP 117/68 | HR 92 | Temp 97.8°F | Resp 18 | Wt 215.6 lb

## 2015-04-11 DIAGNOSIS — C773 Secondary and unspecified malignant neoplasm of axilla and upper limb lymph nodes: Secondary | ICD-10-CM

## 2015-04-11 DIAGNOSIS — Z85818 Personal history of malignant neoplasm of other sites of lip, oral cavity, and pharynx: Secondary | ICD-10-CM

## 2015-04-11 DIAGNOSIS — R05 Cough: Secondary | ICD-10-CM

## 2015-04-11 DIAGNOSIS — R5081 Fever presenting with conditions classified elsewhere: Secondary | ICD-10-CM

## 2015-04-11 DIAGNOSIS — R509 Fever, unspecified: Secondary | ICD-10-CM

## 2015-04-11 DIAGNOSIS — C50412 Malignant neoplasm of upper-outer quadrant of left female breast: Secondary | ICD-10-CM | POA: Diagnosis not present

## 2015-04-11 DIAGNOSIS — Z7689 Persons encountering health services in other specified circumstances: Secondary | ICD-10-CM | POA: Diagnosis not present

## 2015-04-11 DIAGNOSIS — T451X5S Adverse effect of antineoplastic and immunosuppressive drugs, sequela: Secondary | ICD-10-CM

## 2015-04-11 DIAGNOSIS — D701 Agranulocytosis secondary to cancer chemotherapy: Secondary | ICD-10-CM | POA: Diagnosis not present

## 2015-04-11 DIAGNOSIS — R6883 Chills (without fever): Secondary | ICD-10-CM

## 2015-04-11 DIAGNOSIS — Z171 Estrogen receptor negative status [ER-]: Secondary | ICD-10-CM

## 2015-04-11 DIAGNOSIS — Z5111 Encounter for antineoplastic chemotherapy: Secondary | ICD-10-CM | POA: Diagnosis not present

## 2015-04-11 DIAGNOSIS — R531 Weakness: Secondary | ICD-10-CM

## 2015-04-11 DIAGNOSIS — Z923 Personal history of irradiation: Secondary | ICD-10-CM

## 2015-04-11 DIAGNOSIS — Z9221 Personal history of antineoplastic chemotherapy: Secondary | ICD-10-CM

## 2015-04-11 HISTORY — DX: Fever, unspecified: R50.9

## 2015-04-11 LAB — COMPREHENSIVE METABOLIC PANEL
ALBUMIN: 3.6 g/dL (ref 3.5–5.0)
ALT: 14 U/L (ref 14–54)
AST: 15 U/L (ref 15–41)
Alkaline Phosphatase: 93 U/L (ref 38–126)
Anion gap: 6 (ref 5–15)
BILIRUBIN TOTAL: 0.4 mg/dL (ref 0.3–1.2)
BUN: 15 mg/dL (ref 6–20)
CHLORIDE: 106 mmol/L (ref 101–111)
CO2: 24 mmol/L (ref 22–32)
Calcium: 8.5 mg/dL — ABNORMAL LOW (ref 8.9–10.3)
Creatinine, Ser: 0.77 mg/dL (ref 0.44–1.00)
GFR calc Af Amer: >60 mL/min (ref >60)
GFR calc non Af Amer: >60 mL/min (ref >60)
GLUCOSE: 156 mg/dL — AB (ref 65–99)
POTASSIUM: 4.2 mmol/L (ref 3.5–5.1)
Sodium: 136 mmol/L (ref 135–145)
Total Protein: 6.7 g/dL (ref 6.5–8.1)

## 2015-04-11 LAB — RAPID INFLUENZA A&B ANTIGENS
Influenza A (ARMC): NOT DETECTED
Influenza B (ARMC): NOT DETECTED

## 2015-04-11 LAB — CBC WITH DIFFERENTIAL/PLATELET
BASOS ABS: 0.1 10*3/uL (ref 0–0.1)
BASOS PCT: 0 %
EOS ABS: 0 10*3/uL (ref 0–0.7)
Eosinophils Relative: 0 %
HEMATOCRIT: 32.9 % — AB (ref 35.0–47.0)
HEMOGLOBIN: 10.3 g/dL — AB (ref 12.0–16.0)
Lymphocytes Relative: 2 %
Lymphs Abs: 0.8 10*3/uL — ABNORMAL LOW (ref 1.0–3.6)
MCH: 24.6 pg — ABNORMAL LOW (ref 26.0–34.0)
MCHC: 31.2 g/dL — ABNORMAL LOW (ref 32.0–36.0)
MCV: 79.1 fL — ABNORMAL LOW (ref 80.0–100.0)
MONOS PCT: 1 %
Monocytes Absolute: 0.3 10*3/uL (ref 0.2–0.9)
NEUTROS ABS: 37.3 10*3/uL — AB (ref 1.4–6.5)
NEUTROS PCT: 97 %
Platelets: 288 10*3/uL (ref 150–440)
RBC: 4.17 MIL/uL (ref 3.80–5.20)
RDW: 18 % — ABNORMAL HIGH (ref 11.5–14.5)
WBC: 38.4 10*3/uL — AB (ref 3.6–11.0)

## 2015-04-11 MED ORDER — LEVOFLOXACIN 500 MG PO TABS: 500.0000 mg | ORAL_TABLET | Freq: Every day | ORAL | Status: DC

## 2015-04-11 NOTE — Telephone Encounter (Signed)
Patient called and stated she was having fever and chills.  States "I had a terrible day yesterday".  States she has temp of 99, chills, scratchy throat and 3 episodes of watery stool.  States her normal bowel habits are loose and a couple of times a day.  Discussed case with Dr. Oliva Bustard.  Patient is scheduled to come in to see Georgeanne Nim, NP, and get labs of CBC and MetC.  Patient was made aware of her appointment at 10:45.

## 2015-04-11 NOTE — Progress Notes (Signed)
Patient acute add on today due to having chills and low grade temp yesterday.  States she also had a scratchy throat. Also states she has had some diarrhea but thinks that is due to her diabetic meds.

## 2015-04-11 NOTE — Progress Notes (Signed)
[place]  [phone]  Fax:(336) [phone]     STPEHANIE MONTROY DOB: 1945/06/16  MR#: [id number]  IPJ#:[id number]  Patient Care Team: [name], MD as PCP - General [name], MD (General Surgery) [name], MD (Oncology)  CHIEF COMPLAINT:  Chief Complaint  Patient presents with  . Acute Visit    INTERVAL HISTORY:  Patient is here as an acute add on regarding chills, cough, and weakness. Patient was recently diagnosed with carcinoma of breast. She received cycle 2 day 1 chemotherapy with Adriamycin and Cytoxan on 04/08/2015. She denies any fever but is having chills.  REVIEW OF SYSTEMS:   Review of Systems  Constitutional: Positive for chills. Negative for fever, weight loss, malaise/fatigue and diaphoresis.  HENT: Negative.   Eyes: Negative.   Respiratory: Positive for cough. Negative for hemoptysis, sputum production, shortness of breath and wheezing.   Cardiovascular: Negative for chest pain, palpitations, orthopnea, claudication, leg swelling and PND.  Gastrointestinal: Negative for heartburn, nausea, vomiting, abdominal pain, diarrhea, constipation, blood in stool and melena.  Genitourinary: Negative.   Musculoskeletal: Negative.   Skin: Negative.   Neurological: Positive for weakness. Negative for dizziness, tingling, focal weakness and seizures.  Endo/Heme/Allergies: Does not bruise/bleed easily.  Psychiatric/Behavioral: Negative for depression. The patient is not nervous/anxious and does not have insomnia.     As per HPI. Otherwise, a complete review of systems is negatve.   PAST MEDICAL HISTORY: Past Medical History  Diagnosis Date  . Hyperlipidemia   . Diverticulosis   . Shingles     Hx of  . Gastroparesis     related to previous radiation tx  . Anxiety   . Anemia     iron deficiency and B12 deficiency  . Cervical dysplasia     conization  . Diabetes mellitus without complication ([place])   . Tonsillar cancer  ([place]) 2006  . Hypertension   . GERD (gastroesophageal reflux disease)   . Fever 04/11/2015    PAST SURGICAL HISTORY: Past Surgical History  Procedure Laterality Date  . Breast surgery      breast biopsy benign  . Appendectomy    . Tonsillectomy      cancer treated with chemo and radiation  . Cholecystectomy    . Mole removal    . Esophagogastroduodenoscopy  07/2009    normal with few gastric polyps  . Breast cyst aspiration    . Breast biopsy Left 02/25/2015    path pending  . Portacath placement    . Port-a-cath removal    . Radical neck dissection    . Portacath placement Right 03/12/2015    Procedure: INSERTION PORT-A-CATH;  Surgeon: [name], MD;  Location: [facility] ORS;  Service: General;  Laterality: Right;    FAMILY HISTORY Family History  Problem Relation Age of Onset  . Osteoporosis Mother   . Hyperlipidemia Mother   . Depression Mother   . Cancer Mother     skin cancer ? basal cell and lung ca heavy smoker  . Alcohol abuse Father   . Cancer Father     skin CA ? basal cell  . Heart disease Father     CHF  . Hyperlipidemia Brother   . Hypertension Brother     GYNECOLOGIC HISTORY:  No LMP recorded. Patient is postmenopausal.     ADVANCED DIRECTIVES:    HEALTH MAINTENANCE: Social History  Substance Use Topics  . Smoking status: Former Smoker -- 0.50 packs/day for 6 years  Types: Cigarettes    Quit date: 03/10/1984  . Smokeless tobacco: Never Used  . Alcohol Use: No     Allergies  Allergen Reactions  . Amoxicillin     REACTION: rash  . Fluconazole     REACTION: hives  . Gabapentin     depression    Current Outpatient Prescriptions  Medication Sig Dispense Refill  . ALPRAZolam (XANAX) 0.5 MG tablet Take 1 tablet (0.5 mg total) by mouth at bedtime as needed for sleep. 30 tablet 3  . atorvastatin (LIPITOR) 10 MG tablet Take 1 tablet (10 mg total) by mouth daily. (Patient taking differently: Take 10 mg by mouth daily at 6 PM. ) 30 tablet  5  . b complex vitamins tablet Take 1 tablet by mouth daily.      . cyanocobalamin (,VITAMIN B-12,) 1000 MCG/ML injection Inject 1,000 mcg into the muscle every 30 (thirty) days.      . ferrous sulfate 325 (65 FE) MG tablet Take 325 mg by mouth daily with breakfast.      . fluticasone (FLONASE) 50 MCG/ACT nasal spray instill 2 sprays into each nostril once daily 16 g 5  . glipiZIDE (GLUCOTROL) 10 MG tablet Take by mouth.    [name] glucose blood (ONE TOUCH TEST STRIPS) test strip Use as instructed to test blood sugar once daily or as directed 100 each 3  . HYDROcodone-acetaminophen (NORCO) 5-325 MG tablet Take 1-2 tablets by mouth every 4 (four) hours as needed. 30 tablet 0  . insulin aspart (NOVOLOG) 100 UNIT/ML FlexPen     . JANUMET XR 50-1000 MG TB24 Take 2 tablets by mouth daily.  0  . JARDIANCE 25 MG TABS tablet Take 1 tablet by mouth daily. Reported on 03/18/2015  1  . lidocaine-prilocaine (EMLA) cream APPLY TO AFFECTED AREA ONCE A DAY  0  . lisinopril (PRINIVIL,ZESTRIL) 5 MG tablet Take 1 tablet (5 mg total) by mouth daily. (Patient taking differently: Take 5 mg by mouth every morning. ) 30 tablet 5  . mirtazapine (REMERON) 15 MG tablet Take 45 mg by mouth at bedtime.     [name] omeprazole (PRILOSEC) 20 MG capsule Take 1 capsule (20 mg total) by mouth daily. (Patient taking differently: Take 20 mg by mouth at bedtime. ) 30 capsule 5  . ondansetron (ZOFRAN) 8 MG tablet Take 1 tablet (8 mg total) by mouth 2 (two) times daily as needed. Start on the third day after chemotherapy. 30 tablet 1  . ONETOUCH DELICA LANCETS FINE MISC 1 Device by Does not apply route as directed. Use to test blood sugar once daily or as directed 100 each 3  . rOPINIRole (REQUIP) 1 MG tablet 1 pill in the am as needed by mouth and 3 pills at bedtime by mouth (Patient taking differently: Take 1 mg by mouth. 1 pill in the am as needed by mouth and 3 pills at bedtime by mouth) 120 tablet 5  . sertraline (ZOLOFT) 100 MG tablet Take 200  mg by mouth daily.  0  . sertraline (ZOLOFT) 50 MG tablet Take 50 mg by mouth every morning.     [name] spironolactone (ALDACTONE) 25 MG tablet Take by mouth.    . levofloxacin (LEVAQUIN) 500 MG tablet Take 1 tablet (500 mg total) by mouth daily. 7 tablet 0   No current facility-administered medications for this visit.    OBJECTIVE: BP 117/68 mmHg  Pulse 92  Temp(Src) 97.8 F (36.6 C) (Tympanic)  Resp 18  Wt 215 lb 9.8  oz (97.8 kg)  SpO2 95%   Body mass index is 34.82 kg/(m^2).    ECOG FS:1 - Symptomatic but completely ambulatory  General: Well-developed, well-nourished, no acute distress. Eyes: Pink conjunctiva, anicteric sclera. HEENT: Normocephalic, moist mucous membranes, clear oropharnyx. Lungs: Clear to auscultation bilaterally. Heart: Regular rate and rhythm. No rubs, murmurs, or gallops. Abdomen: Soft, nontender, nondistended. No organomegaly noted, normoactive bowel sounds. Musculoskeletal: No edema, cyanosis, or clubbing. Neuro: Alert, answering all questions appropriately. Cranial nerves grossly intact. Skin: No rashes or petechiae noted. Psych: Normal affect. Lymphatics: No cervical, clavicular LAD.   LAB RESULTS:  Appointment on 04/11/2015  Component Date Value Ref Range Status  . WBC 04/11/2015 38.4* 3.6 - 11.0 K/uL Final  . RBC 04/11/2015 4.17  3.80 - 5.20 MIL/uL Final  . Hemoglobin 04/11/2015 10.3* 12.0 - 16.0 g/dL Final  . HCT 04/11/2015 32.9* 35.0 - 47.0 % Final  . MCV 04/11/2015 79.1* 80.0 - 100.0 fL Final  . MCH 04/11/2015 24.6* 26.0 - 34.0 pg Final  . MCHC 04/11/2015 31.2* 32.0 - 36.0 g/dL Final  . RDW 04/11/2015 18.0* 11.5 - 14.5 % Final  . Platelets 04/11/2015 288  150 - 440 K/uL Final  . Neutrophils Relative % 04/11/2015 97   Final  . Neutro Abs 04/11/2015 37.3* 1.4 - 6.5 K/uL Final  . Lymphocytes Relative 04/11/2015 2   Final  . Lymphs Abs 04/11/2015 0.8* 1.0 - 3.6 K/uL Final  . Monocytes Relative 04/11/2015 1   Final  . Monocytes Absolute  04/11/2015 0.3  0.2 - 0.9 K/uL Final  . Eosinophils Relative 04/11/2015 0   Final  . Eosinophils Absolute 04/11/2015 0.0  0 - 0.7 K/uL Final  . Basophils Relative 04/11/2015 0   Final  . Basophils Absolute 04/11/2015 0.1  0 - 0.1 K/uL Final  . Sodium 04/11/2015 136  135 - 145 mmol/L Final  . Potassium 04/11/2015 4.2  3.5 - 5.1 mmol/L Final  . Chloride 04/11/2015 106  101 - 111 mmol/L Final  . CO2 04/11/2015 24  22 - 32 mmol/L Final  . Glucose, Bld 04/11/2015 156* 65 - 99 mg/dL Final  . BUN 04/11/2015 15  6 - 20 mg/dL Final  . Creatinine, Ser 04/11/2015 0.77  0.44 - 1.00 mg/dL Final  . Calcium 04/11/2015 8.5* 8.9 - 10.3 mg/dL Final  . Total Protein 04/11/2015 6.7  6.5 - 8.1 g/dL Final  . Albumin 04/11/2015 3.6  3.5 - 5.0 g/dL Final  . AST 04/11/2015 15  15 - 41 U/L Final  . ALT 04/11/2015 14  14 - 54 U/L Final  . Alkaline Phosphatase 04/11/2015 93  38 - 126 U/L Final  . Total Bilirubin 04/11/2015 0.4  0.3 - 1.2 mg/dL Final  . GFR calc non Af Amer 04/11/2015 >60  >60 mL/min Final  . GFR calc Af Amer 04/11/2015 >60  >60 mL/min Final   Comment: (NOTE) The eGFR has been calculated using the CKD EPI equation. This calculation has not been validated in all clinical situations. eGFR's persistently <60 mL/min signify possible Chronic Kidney Disease.   . Anion gap 04/11/2015 6  5 - 15 Final  . Influenza A ([facility]) 04/11/2015 NOT DETECTED   Final  . Influenza B ([facility]) 04/11/2015 NOT DETECTED   Final    STUDIES: No results found.  ASSESSMENT:  Neutropenic fever.  PLAN:   1. Neutropenic fever. Patient received cycle 2 day 1 Adriamycin and Cytoxan on February 20 followed by Neulasta. She however developed chills, cough, and weakness  on Wednesday, February 22. Patient was swabbed for influenza and reported as negative. We'll start patient on Levaquin 500 mg for 7 days. Patient has follow-up scheduled with Dr. Oliva Bustard. She was advised to call us if any worsening symptoms.  Patient expressed  understanding and was in agreement with this plan. She also understands that She can call clinic at any time with any questions, concerns, or complaints.   Dr. Oliva Bustard was available for consultation and review of plan of care for this patient.  Cancer of left breast [facility])   Staging form: Breast, AJCC 7th Edition     Clinical: Stage IIIA (T3, N1, M0) - Signed by [name], MD on 03/01/2015   Evlyn Kanner, NP   04/11/2015 2:08 PM

## 2015-04-12 ENCOUNTER — Other Ambulatory Visit: Payer: Self-pay | Admitting: *Deleted

## 2015-04-12 MED ORDER — FLUTICASONE PROPIONATE 50 MCG/ACT NA SUSP: NASAL | Status: DC

## 2015-04-15 ENCOUNTER — Telehealth: Payer: Self-pay | Admitting: *Deleted

## 2015-04-15 ENCOUNTER — Inpatient Hospital Stay: Payer: Medicare Other

## 2015-04-15 DIAGNOSIS — C50412 Malignant neoplasm of upper-outer quadrant of left female breast: Secondary | ICD-10-CM

## 2015-04-15 DIAGNOSIS — C773 Secondary and unspecified malignant neoplasm of axilla and upper limb lymph nodes: Secondary | ICD-10-CM | POA: Diagnosis not present

## 2015-04-15 DIAGNOSIS — Z171 Estrogen receptor negative status [ER-]: Secondary | ICD-10-CM | POA: Diagnosis not present

## 2015-04-15 DIAGNOSIS — Z5111 Encounter for antineoplastic chemotherapy: Secondary | ICD-10-CM | POA: Diagnosis not present

## 2015-04-15 DIAGNOSIS — D701 Agranulocytosis secondary to cancer chemotherapy: Secondary | ICD-10-CM | POA: Diagnosis not present

## 2015-04-15 DIAGNOSIS — Z7689 Persons encountering health services in other specified circumstances: Secondary | ICD-10-CM | POA: Diagnosis not present

## 2015-04-15 LAB — CBC WITH DIFFERENTIAL/PLATELET
BASOS ABS: 0 10*3/uL (ref 0–0.1)
Basophils Relative: 1 %
EOS ABS: 0 10*3/uL (ref 0–0.7)
HCT: 32 % — ABNORMAL LOW (ref 35.0–47.0)
Hemoglobin: 10.5 g/dL — ABNORMAL LOW (ref 12.0–16.0)
Lymphocytes Relative: 42 %
Lymphs Abs: 0.4 10*3/uL — ABNORMAL LOW (ref 1.0–3.6)
MCH: 25.6 pg — AB (ref 26.0–34.0)
MCHC: 32.8 g/dL (ref 32.0–36.0)
MCV: 78 fL — ABNORMAL LOW (ref 80.0–100.0)
Monocytes Absolute: 0.1 10*3/uL — ABNORMAL LOW (ref 0.2–0.9)
Monocytes Relative: 13 %
Neutro Abs: 0.4 10*3/uL — ABNORMAL LOW (ref 1.4–6.5)
Neutrophils Relative %: 40 %
PLATELETS: 187 10*3/uL (ref 150–440)
RBC: 4.11 MIL/uL (ref 3.80–5.20)
RDW: 17.6 % — ABNORMAL HIGH (ref 11.5–14.5)
WBC: 1 10*3/uL — AB (ref 3.6–11.0)

## 2015-04-15 NOTE — Telephone Encounter (Signed)
Critical lab on ANC 0.4 called. Magda Paganini, NP aware. Pt already on levaquin at this time. Continue to monitor.

## 2015-04-22 ENCOUNTER — Inpatient Hospital Stay: Payer: Medicare Other | Attending: Oncology

## 2015-04-22 DIAGNOSIS — D509 Iron deficiency anemia, unspecified: Secondary | ICD-10-CM | POA: Diagnosis not present

## 2015-04-22 DIAGNOSIS — C50412 Malignant neoplasm of upper-outer quadrant of left female breast: Secondary | ICD-10-CM | POA: Diagnosis not present

## 2015-04-22 DIAGNOSIS — K219 Gastro-esophageal reflux disease without esophagitis: Secondary | ICD-10-CM | POA: Insufficient documentation

## 2015-04-22 DIAGNOSIS — Z87891 Personal history of nicotine dependence: Secondary | ICD-10-CM | POA: Insufficient documentation

## 2015-04-22 DIAGNOSIS — E785 Hyperlipidemia, unspecified: Secondary | ICD-10-CM | POA: Insufficient documentation

## 2015-04-22 DIAGNOSIS — Z7689 Persons encountering health services in other specified circumstances: Secondary | ICD-10-CM | POA: Insufficient documentation

## 2015-04-22 DIAGNOSIS — Z794 Long term (current) use of insulin: Secondary | ICD-10-CM | POA: Insufficient documentation

## 2015-04-22 DIAGNOSIS — Z9049 Acquired absence of other specified parts of digestive tract: Secondary | ICD-10-CM | POA: Diagnosis not present

## 2015-04-22 DIAGNOSIS — I10 Essential (primary) hypertension: Secondary | ICD-10-CM | POA: Insufficient documentation

## 2015-04-22 DIAGNOSIS — Z85818 Personal history of malignant neoplasm of other sites of lip, oral cavity, and pharynx: Secondary | ICD-10-CM | POA: Diagnosis not present

## 2015-04-22 DIAGNOSIS — Z452 Encounter for adjustment and management of vascular access device: Secondary | ICD-10-CM | POA: Diagnosis not present

## 2015-04-22 DIAGNOSIS — Z5111 Encounter for antineoplastic chemotherapy: Secondary | ICD-10-CM | POA: Diagnosis not present

## 2015-04-22 DIAGNOSIS — Z79899 Other long term (current) drug therapy: Secondary | ICD-10-CM | POA: Insufficient documentation

## 2015-04-22 DIAGNOSIS — E538 Deficiency of other specified B group vitamins: Secondary | ICD-10-CM | POA: Insufficient documentation

## 2015-04-22 DIAGNOSIS — E119 Type 2 diabetes mellitus without complications: Secondary | ICD-10-CM | POA: Diagnosis not present

## 2015-04-22 DIAGNOSIS — Z171 Estrogen receptor negative status [ER-]: Secondary | ICD-10-CM | POA: Diagnosis not present

## 2015-04-22 DIAGNOSIS — Z9221 Personal history of antineoplastic chemotherapy: Secondary | ICD-10-CM | POA: Diagnosis not present

## 2015-04-22 DIAGNOSIS — C773 Secondary and unspecified malignant neoplasm of axilla and upper limb lymph nodes: Secondary | ICD-10-CM | POA: Diagnosis not present

## 2015-04-22 DIAGNOSIS — Z923 Personal history of irradiation: Secondary | ICD-10-CM | POA: Insufficient documentation

## 2015-04-22 LAB — CBC WITH DIFFERENTIAL/PLATELET
BASOS PCT: 1 %
Basophils Absolute: 0.1 10*3/uL (ref 0–0.1)
EOS ABS: 0.1 10*3/uL (ref 0–0.7)
EOS PCT: 2 %
HCT: 33 % — ABNORMAL LOW (ref 35.0–47.0)
Hemoglobin: 10.6 g/dL — ABNORMAL LOW (ref 12.0–16.0)
LYMPHS ABS: 0.8 10*3/uL — AB (ref 1.0–3.6)
Lymphocytes Relative: 9 %
MCH: 25.4 pg — AB (ref 26.0–34.0)
MCHC: 32 g/dL (ref 32.0–36.0)
MCV: 79.3 fL — AB (ref 80.0–100.0)
Monocytes Absolute: 0.7 10*3/uL (ref 0.2–0.9)
Monocytes Relative: 8 %
Neutro Abs: 7.3 10*3/uL — ABNORMAL HIGH (ref 1.4–6.5)
Neutrophils Relative %: 80 %
PLATELETS: 112 10*3/uL — AB (ref 150–440)
RBC: 4.16 MIL/uL (ref 3.80–5.20)
RDW: 17.9 % — ABNORMAL HIGH (ref 11.5–14.5)
WBC: 9 10*3/uL (ref 3.6–11.0)

## 2015-04-29 ENCOUNTER — Inpatient Hospital Stay (HOSPITAL_BASED_OUTPATIENT_CLINIC_OR_DEPARTMENT_OTHER): Payer: Medicare Other | Admitting: Oncology

## 2015-04-29 ENCOUNTER — Encounter: Payer: Self-pay | Admitting: Oncology

## 2015-04-29 ENCOUNTER — Inpatient Hospital Stay: Payer: Medicare Other

## 2015-04-29 VITALS — HR 86

## 2015-04-29 DIAGNOSIS — C773 Secondary and unspecified malignant neoplasm of axilla and upper limb lymph nodes: Secondary | ICD-10-CM

## 2015-04-29 DIAGNOSIS — D509 Iron deficiency anemia, unspecified: Secondary | ICD-10-CM | POA: Diagnosis not present

## 2015-04-29 DIAGNOSIS — Z9049 Acquired absence of other specified parts of digestive tract: Secondary | ICD-10-CM

## 2015-04-29 DIAGNOSIS — Z85818 Personal history of malignant neoplasm of other sites of lip, oral cavity, and pharynx: Secondary | ICD-10-CM

## 2015-04-29 DIAGNOSIS — I10 Essential (primary) hypertension: Secondary | ICD-10-CM

## 2015-04-29 DIAGNOSIS — Z923 Personal history of irradiation: Secondary | ICD-10-CM

## 2015-04-29 DIAGNOSIS — C50412 Malignant neoplasm of upper-outer quadrant of left female breast: Secondary | ICD-10-CM | POA: Diagnosis not present

## 2015-04-29 DIAGNOSIS — E538 Deficiency of other specified B group vitamins: Secondary | ICD-10-CM

## 2015-04-29 DIAGNOSIS — E785 Hyperlipidemia, unspecified: Secondary | ICD-10-CM

## 2015-04-29 DIAGNOSIS — Z171 Estrogen receptor negative status [ER-]: Secondary | ICD-10-CM

## 2015-04-29 DIAGNOSIS — Z452 Encounter for adjustment and management of vascular access device: Secondary | ICD-10-CM | POA: Diagnosis not present

## 2015-04-29 DIAGNOSIS — E119 Type 2 diabetes mellitus without complications: Secondary | ICD-10-CM

## 2015-04-29 DIAGNOSIS — Z9221 Personal history of antineoplastic chemotherapy: Secondary | ICD-10-CM

## 2015-04-29 DIAGNOSIS — Z5111 Encounter for antineoplastic chemotherapy: Secondary | ICD-10-CM | POA: Diagnosis not present

## 2015-04-29 LAB — CBC WITH DIFFERENTIAL/PLATELET
Basophils Absolute: 0.1 10*3/uL (ref 0–0.1)
Basophils Relative: 1 %
EOS ABS: 0 10*3/uL (ref 0–0.7)
EOS PCT: 1 %
HCT: 32.3 % — ABNORMAL LOW (ref 35.0–47.0)
Hemoglobin: 10.6 g/dL — ABNORMAL LOW (ref 12.0–16.0)
LYMPHS ABS: 0.7 10*3/uL — AB (ref 1.0–3.6)
Lymphocytes Relative: 8 %
MCH: 26.2 pg (ref 26.0–34.0)
MCHC: 32.9 g/dL (ref 32.0–36.0)
MCV: 79.5 fL — ABNORMAL LOW (ref 80.0–100.0)
MONOS PCT: 7 %
Monocytes Absolute: 0.6 10*3/uL (ref 0.2–0.9)
Neutro Abs: 6.9 10*3/uL — ABNORMAL HIGH (ref 1.4–6.5)
Neutrophils Relative %: 83 %
PLATELETS: 267 10*3/uL (ref 150–440)
RBC: 4.06 MIL/uL (ref 3.80–5.20)
RDW: 19.7 % — ABNORMAL HIGH (ref 11.5–14.5)
WBC: 8.2 10*3/uL (ref 3.6–11.0)

## 2015-04-29 LAB — COMPREHENSIVE METABOLIC PANEL
ALK PHOS: 73 U/L (ref 38–126)
ALT: 18 U/L (ref 14–54)
ANION GAP: 7 (ref 5–15)
AST: 21 U/L (ref 15–41)
Albumin: 3.6 g/dL (ref 3.5–5.0)
BUN: 10 mg/dL (ref 6–20)
CALCIUM: 8.5 mg/dL — AB (ref 8.9–10.3)
CHLORIDE: 105 mmol/L (ref 101–111)
CO2: 25 mmol/L (ref 22–32)
Creatinine, Ser: 0.79 mg/dL (ref 0.44–1.00)
Glucose, Bld: 132 mg/dL — ABNORMAL HIGH (ref 65–99)
Potassium: 3.6 mmol/L (ref 3.5–5.1)
SODIUM: 137 mmol/L (ref 135–145)
Total Bilirubin: 0.4 mg/dL (ref 0.3–1.2)
Total Protein: 7.2 g/dL (ref 6.5–8.1)

## 2015-04-29 MED ORDER — HEPARIN SOD (PORK) LOCK FLUSH 100 UNIT/ML IV SOLN
500.0000 [IU] | Freq: Once | INTRAVENOUS | Status: AC
  Administered 2015-04-29: 500 [IU] via INTRAVENOUS
  Filled 2015-04-29: qty 5

## 2015-04-29 MED ORDER — PEGFILGRASTIM 6 MG/0.6ML ~~LOC~~ PSKT
6.0000 mg | PREFILLED_SYRINGE | Freq: Once | SUBCUTANEOUS | Status: AC
  Administered 2015-04-29: 6 mg via SUBCUTANEOUS
  Filled 2015-04-29: qty 0.6

## 2015-04-29 MED ORDER — SODIUM CHLORIDE 0.9% FLUSH
10.0000 mL | Freq: Once | INTRAVENOUS | Status: AC
  Administered 2015-04-29: 10 mL via INTRAVENOUS
  Filled 2015-04-29: qty 10

## 2015-04-29 MED ORDER — FOSAPREPITANT DIMEGLUMINE INJECTION 150 MG
Freq: Once | INTRAVENOUS | Status: AC
  Administered 2015-04-29: 11:00:00 via INTRAVENOUS
  Filled 2015-04-29: qty 5

## 2015-04-29 MED ORDER — SODIUM CHLORIDE 0.9 % IV SOLN
600.0000 mg/m2 | Freq: Once | INTRAVENOUS | Status: AC
  Administered 2015-04-29: 1120 mg via INTRAVENOUS
  Filled 2015-04-29: qty 50

## 2015-04-29 MED ORDER — DOXORUBICIN HCL CHEMO IV INJECTION 2 MG/ML
60.0000 mg/m2 | Freq: Once | INTRAVENOUS | Status: AC
  Administered 2015-04-29: 112 mg via INTRAVENOUS
  Filled 2015-04-29: qty 56

## 2015-04-29 MED ORDER — PALONOSETRON HCL INJECTION 0.25 MG/5ML
0.2500 mg | Freq: Once | INTRAVENOUS | Status: AC
  Administered 2015-04-29: 0.25 mg via INTRAVENOUS
  Filled 2015-04-29: qty 5

## 2015-04-29 MED ORDER — SODIUM CHLORIDE 0.9 % IV SOLN
Freq: Once | INTRAVENOUS | Status: AC
  Administered 2015-04-29: 11:00:00 via INTRAVENOUS
  Filled 2015-04-29: qty 1000

## 2015-04-29 NOTE — Progress Notes (Signed)
[place] @ [facility] Telephone:([phone]  Fax:(336) [place] OB: 1946-01-28  MR#: [id number]  [id number]  Patient Care Team: [name], MD as PCP - General [name], MD (General Surgery) [name], MD (Oncology)  CHIEF COMPLAINT:  Chief Complaint  Patient presents with  . 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  5 CM TUMOR MASS BIOPSIES POSITIVE FOR INVASIVE MAMMARY CARCINOMA TRIPLE NEGATIVE DISEASE(jANUARY, 2017)stage IIIa. 3.  MUGA scan of the heart shows ejection fraction to be 61% (January, 2017) 4  4.  Patient will start chemotherapy with Cytoxan and Adriamycin from March 18, 2015 VISIT DIAGNOSIS:   No diagnosis found.    No history exists.      INTERVAL HISTORY: 70-YEAR-OLD LADY WHO HAD BEEN PREVIOUSLY SEEN BY ME FOR TONSILLAR CANCER STATUS POST RESECTION AND RADIATION AND CHEMOTHERAPY. According to patient she was getting mammograms done on a regular basis however looking at old records do not have any mammogram available for review until January of 2017 April 29, 2015 Patient has a Eugenio Hoes he has some soreness in the mouth.  No chills or fever. Tolerated to treatment very well without any significant side effects so far episode of diarrhea and nausea lasting only for 48 hours.  REVIEW OF SYSTEMS:   GENERAL:  Feels good.  Active.  No fevers, sweats or weight loss. PERFORMANCE STATUS (ECOG):  0-1 HEENT:  No visual changes, runny nose, sore throat, mouth sores or tenderness.Ihad a recurrent sinus infection and and a previous history of tonsillar cancer. Eugenio Hoes ER no evidence of stomatitis Lungs: No shortness of breath or cough.  No hemoptysis. Cardiac:  No chest pain,  palpitations, orthopnea, or PND. GI:  No nausea, vomiting, diarrhea, constipation, melena or hematochezia.[name]  alopecia GU:  No urgency, frequency, dysuria, or hematuria. Musculoskeletal:  No back pain.  No joint pain.  No muscle tenderness. Extremities:  No pain or swelling. Skin:  No rashes or skin changes. Neuro:  No headache, numbness or weakness, balance or coordination issues. Endocrine:  No diabetes, thyroid issues, hot flashes or night sweats. Psych:  No mood changes, depression or anxiety. Pain:  No focal pain. Review of systems:  All other systems reviewed and found to be negative.  As per HPI. Otherwise, a complete review of systems is negatve.  PAST MEDICAL HISTORY: Past Medical History  Diagnosis Date  . Hyperlipidemia   . Diverticulosis   . Shingles     Hx of  . Gastroparesis     related to previous radiation tx  . Anxiety   . Anemia     iron deficiency and B12 deficiency  . Cervical dysplasia     conization  . Diabetes mellitus without complication ([place])   . 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[place]) 2006  . Hypertension   . GERD (gastroesophageal reflux disease)   . Fever 04/11/2015    PAST SURGICAL HISTORY: Past Surgical History  Procedure Laterality Date  . Breast surgery      breast biopsy benign  . Appendectomy    . Tonsillectomy      cancer treated with chemo and radiation  . Cholecystectomy    . Mole removal    . Esophagogastroduodenoscopy  07/2009    normal with few gastric polyps  . Breast cyst aspiration    .  Breast biopsy Left 02/25/2015    path pending  . Portacath placement    . Port-a-cath removal    . Radical neck dissection    . Portacath placement Right 03/12/2015    Procedure: INSERTION PORT-A-CATH;  Surgeon: [name], MD;  Location: [facility] ORS;  Service: General;  Laterality: Right;    FAMILY HISTORY Family History  Problem Relation Age of Onset  . Osteoporosis Mother   . Hyperlipidemia Mother   . Depression Mother   . Cancer Mother      skin cancer ? basal cell and lung ca heavy smoker  . Alcohol abuse Father   . Cancer Father     skin CA ? basal cell  . Heart disease Father     CHF  . Hyperlipidemia Brother   . Hypertension Brother    Significant History/PMH:   Hypercholesterolemia:    tonsil cancer:    Sinus Surgery:    breast biopsy-negative: 2000   cone biopsy for cervical dysplasia 1980's:    Cholecystectomy: 2000   Appendectomy: 1970  Preventive Screening:  Has patient had any of the following test?as per patient as he was getting regular mammogram done but we cannot find any record in   [place] breast cancer Mammography (1)  Smoking History: Smoking History quit 1980's (early)(1)  PFSH: Additional Past Medical and Surgical History: Past Medical History  Hypercholesterolemia.    Past Surgical History:  Appendectomy in 1970.  Gallbladder removal in 2000.  Cone biopsy for cervical dysplasia in the early 80's.  Breast biopsy in early 2000 which was negative.    Family History:  There is a family history of heart disease. She denies a family history of any cancer, diabetes, or any bleeding disorders.    Social History:  She is married. She does not have any children. She works at a [employer]. She has a history of smoking about 1 pack every 2 days from 1968-1990. She denies any alcohol use. She denies any recreational drug use. She denies any radiation exposure         ADVANCED DIRECTIVES:    HEALTH MAINTENANCE: Social History  Substance Use Topics  . Smoking status: Former Smoker -- 0.50 packs/day for 6 years    Types: Cigarettes    Quit date: 03/10/1984  . Smokeless tobacco: Never Used  . Alcohol Use: No      Allergies  Allergen Reactions  . Amoxicillin     REACTION: rash  . Fluconazole     REACTION: hives  . Gabapentin     depression    Current Outpatient Prescriptions  Medication Sig Dispense Refill  . ALPRAZolam (XANAX) 0.5 MG tablet Take 1 tablet  (0.5 mg total) by mouth at bedtime as needed for sleep. 30 tablet 3  . atorvastatin (LIPITOR) 10 MG tablet Take 1 tablet (10 mg total) by mouth daily. (Patient taking differently: Take 10 mg by mouth daily at 6 PM. ) 30 tablet 5  . b complex vitamins tablet Take 1 tablet by mouth daily.      . cyanocobalamin (,VITAMIN B-12,) 1000 MCG/ML injection Inject 1,000 mcg into the muscle every 30 (thirty) days.      . ferrous sulfate 325 (65 FE) MG tablet Take 325 mg by mouth daily with breakfast.      . fluticasone (FLONASE) 50 MCG/ACT nasal spray instill 2 sprays into each nostril once daily 16 g 5  . glipiZIDE (GLUCOTROL) 10 MG tablet Take by mouth.    [name]  glucose blood (ONE TOUCH TEST STRIPS) test strip Use as instructed to test blood sugar once daily or as directed 100 each 3  . insulin aspart (NOVOLOG) 100 UNIT/ML FlexPen     . JANUMET XR 50-1000 MG TB24 Take 2 tablets by mouth daily.  0  . lidocaine-prilocaine (EMLA) cream APPLY TO AFFECTED AREA ONCE A DAY  0  . lisinopril (PRINIVIL,ZESTRIL) 5 MG tablet Take 1 tablet (5 mg total) by mouth daily. (Patient taking differently: Take 5 mg by mouth every morning. ) 30 tablet 5  . mirtazapine (REMERON) 15 MG tablet Take 45 mg by mouth at bedtime.     [name] omeprazole (PRILOSEC) 20 MG capsule Take 1 capsule (20 mg total) by mouth daily. (Patient taking differently: Take 20 mg by mouth at bedtime. ) 30 capsule 5  . ondansetron (ZOFRAN) 8 MG tablet Take 1 tablet (8 mg total) by mouth 2 (two) times daily as needed. Start on the third day after chemotherapy. 30 tablet 1  . ONETOUCH DELICA LANCETS FINE MISC 1 Device by Does not apply route as directed. Use to test blood sugar once daily or as directed 100 each 3  . rOPINIRole (REQUIP) 1 MG tablet 1 pill in the am as needed by mouth and 3 pills at bedtime by mouth (Patient taking differently: Take 1 mg by mouth. 1 pill in the am as needed by mouth and 3 pills at bedtime by mouth) 120 tablet 5  . HYDROcodone-acetaminophen  (NORCO/VICODIN) 5-325 MG tablet   0  . levofloxacin (LEVAQUIN) 500 MG tablet Take 500 mg by mouth daily.  0  . sertraline (ZOLOFT) 100 MG tablet Take 200 mg by mouth daily.  0   No current facility-administered medications for this visit.   Facility-Administered Medications Ordered in Other Visits  Medication Dose Route Frequency Provider Last Rate Last Dose  . heparin lock flush 100 unit/mL  500 Units Intravenous Once [name], MD        OBJECTIVE: PHYSICAL EXAM: Gen. Status: Patient is alert oriented not in any acute distress HEENT: Patient previously had tonsillar cancer status post chemoradiation therapy and resection.  There is no evidence of recurrent disease Lymphatic system: Supraclavicular, cervical,  inguinal lymph nodes are not palpable, There might be palpable left axillary lymph node. Examination of the chest was unremarkable. There were no bony deformities, no asymmetry, and no other abnormalities.  Cardiac exam revealed the PMI to be normally situated and sized. The rhythm was regular and no extrasystoles were noted during several minutes of auscultation. The first and second heart sounds were normal and physiologic splitting of the second heart sound was noted. There were no murmurs, rubs, clicks, or gallops. Neurologically, the patient was awake, alert, and oriented to person, place and time. There were no obvious focal neurologic abnormalities. Examination of breasts: Right breast free of masses.  Left breast mass has decreased in size. Palpable lymph node has decreased in size     Body mass index is 34.71 kg/(m^2).    ECOG FS:1 - Symptomatic but completely ambulatory  LAB RESULTS:     STUDIES: No results found.  ASSESSMENT: carcinoma of breast.  Left upper and outer quadrant tumor size is more than 5 cm Triple negative disease. Seed with the third cycle of chemotherapy. All lab data has been reviewed   Fourth cycle would be given on April 3.  In my  absence patient will be seen by Georgeanne Nim 3 weeks after that patient will be started  on carboplatinum and Taxol (because of triple-negative disease) If needed ultrasound will be done PLAN:     continue second cycle of chemotherapy  Alopecia   and one episode of diarrhea no other significant side effects.    Crease in the size of the axillary and   Breast  mass   Lab data has been reviewed  We did not find any previous mammogram done at [facility].  So we will call her primary care physician and if find out whether any mammogram has been done at outside institution.  As patient is very sure that the mammogram was done last year  Patient expressed understanding and was in agreement with this plan. She also understands that She can call clinic at any time with any questions, concerns, or complaints.    No matching staging information was found for the patient.  [name], MD   04/29/2015 10:23 AM

## 2015-05-06 ENCOUNTER — Other Ambulatory Visit: Payer: Medicare Other

## 2015-05-06 ENCOUNTER — Inpatient Hospital Stay: Payer: Medicare Other

## 2015-05-08 ENCOUNTER — Other Ambulatory Visit: Payer: Self-pay | Admitting: Oncology

## 2015-05-08 ENCOUNTER — Inpatient Hospital Stay: Payer: Medicare Other

## 2015-05-08 DIAGNOSIS — C50412 Malignant neoplasm of upper-outer quadrant of left female breast: Secondary | ICD-10-CM

## 2015-05-08 DIAGNOSIS — C773 Secondary and unspecified malignant neoplasm of axilla and upper limb lymph nodes: Secondary | ICD-10-CM | POA: Diagnosis not present

## 2015-05-08 DIAGNOSIS — D509 Iron deficiency anemia, unspecified: Secondary | ICD-10-CM | POA: Diagnosis not present

## 2015-05-08 DIAGNOSIS — Z5111 Encounter for antineoplastic chemotherapy: Secondary | ICD-10-CM | POA: Diagnosis not present

## 2015-05-08 DIAGNOSIS — Z171 Estrogen receptor negative status [ER-]: Secondary | ICD-10-CM | POA: Diagnosis not present

## 2015-05-08 DIAGNOSIS — Z452 Encounter for adjustment and management of vascular access device: Secondary | ICD-10-CM | POA: Diagnosis not present

## 2015-05-08 LAB — CBC WITH DIFFERENTIAL/PLATELET
Basophils Absolute: 0.1 10*3/uL (ref 0–0.1)
Basophils Relative: 5 %
EOS PCT: 3 %
Eosinophils Absolute: 0.1 10*3/uL (ref 0–0.7)
HEMATOCRIT: 30.1 % — AB (ref 35.0–47.0)
Hemoglobin: 9.8 g/dL — ABNORMAL LOW (ref 12.0–16.0)
LYMPHS ABS: 0.4 10*3/uL — AB (ref 1.0–3.6)
LYMPHS PCT: 21 %
MCH: 26.4 pg (ref 26.0–34.0)
MCHC: 32.6 g/dL (ref 32.0–36.0)
MCV: 80.8 fL (ref 80.0–100.0)
Monocytes Absolute: 0.2 10*3/uL (ref 0.2–0.9)
Monocytes Relative: 11 %
Neutro Abs: 1.2 10*3/uL — ABNORMAL LOW (ref 1.4–6.5)
Neutrophils Relative %: 60 %
PLATELETS: 202 10*3/uL (ref 150–440)
RBC: 3.73 MIL/uL — AB (ref 3.80–5.20)
RDW: 20.2 % — ABNORMAL HIGH (ref 11.5–14.5)
WBC: 2.1 10*3/uL — AB (ref 3.6–11.0)

## 2015-05-13 ENCOUNTER — Other Ambulatory Visit: Payer: Medicare Other

## 2015-05-13 ENCOUNTER — Other Ambulatory Visit: Payer: Self-pay | Admitting: *Deleted

## 2015-05-13 ENCOUNTER — Inpatient Hospital Stay: Payer: Medicare Other

## 2015-05-13 DIAGNOSIS — D509 Iron deficiency anemia, unspecified: Secondary | ICD-10-CM | POA: Diagnosis not present

## 2015-05-13 DIAGNOSIS — C50412 Malignant neoplasm of upper-outer quadrant of left female breast: Secondary | ICD-10-CM

## 2015-05-13 DIAGNOSIS — Z5111 Encounter for antineoplastic chemotherapy: Secondary | ICD-10-CM | POA: Diagnosis not present

## 2015-05-13 DIAGNOSIS — Z171 Estrogen receptor negative status [ER-]: Secondary | ICD-10-CM | POA: Diagnosis not present

## 2015-05-13 DIAGNOSIS — C773 Secondary and unspecified malignant neoplasm of axilla and upper limb lymph nodes: Secondary | ICD-10-CM | POA: Diagnosis not present

## 2015-05-13 DIAGNOSIS — Z452 Encounter for adjustment and management of vascular access device: Secondary | ICD-10-CM | POA: Diagnosis not present

## 2015-05-13 DIAGNOSIS — E119 Type 2 diabetes mellitus without complications: Secondary | ICD-10-CM | POA: Diagnosis not present

## 2015-05-13 LAB — CBC WITH DIFFERENTIAL/PLATELET
Basophils Absolute: 0.1 10*3/uL (ref 0–0.1)
Basophils Relative: 1 %
Eosinophils Absolute: 0.1 10*3/uL (ref 0–0.7)
Eosinophils Relative: 2 %
HCT: 30.7 % — ABNORMAL LOW (ref 35.0–47.0)
Hemoglobin: 10 g/dL — ABNORMAL LOW (ref 12.0–16.0)
Lymphocytes Relative: 6 %
Lymphs Abs: 0.4 10*3/uL — ABNORMAL LOW (ref 1.0–3.6)
MCH: 26.8 pg (ref 26.0–34.0)
MCHC: 32.5 g/dL (ref 32.0–36.0)
MCV: 82.3 fL (ref 80.0–100.0)
Monocytes Absolute: 0.4 10*3/uL (ref 0.2–0.9)
Monocytes Relative: 6 %
Neutro Abs: 5.9 10*3/uL (ref 1.4–6.5)
Neutrophils Relative %: 85 %
Platelets: 176 10*3/uL (ref 150–440)
RBC: 3.73 MIL/uL — ABNORMAL LOW (ref 3.80–5.20)
RDW: 22.4 % — ABNORMAL HIGH (ref 11.5–14.5)
WBC: 7 10*3/uL (ref 3.6–11.0)

## 2015-05-13 NOTE — Telephone Encounter (Signed)
Already responded to

## 2015-05-21 ENCOUNTER — Inpatient Hospital Stay (HOSPITAL_BASED_OUTPATIENT_CLINIC_OR_DEPARTMENT_OTHER): Payer: Medicare Other | Admitting: Family Medicine

## 2015-05-21 ENCOUNTER — Encounter: Payer: Self-pay | Admitting: *Deleted

## 2015-05-21 ENCOUNTER — Inpatient Hospital Stay: Payer: Medicare Other | Attending: Family Medicine

## 2015-05-21 ENCOUNTER — Inpatient Hospital Stay: Payer: Medicare Other

## 2015-05-21 VITALS — BP 115/71 | HR 80 | Temp 96.3°F | Resp 18 | Wt 211.4 lb

## 2015-05-21 DIAGNOSIS — E785 Hyperlipidemia, unspecified: Secondary | ICD-10-CM

## 2015-05-21 DIAGNOSIS — Z85818 Personal history of malignant neoplasm of other sites of lip, oral cavity, and pharynx: Secondary | ICD-10-CM | POA: Diagnosis not present

## 2015-05-21 DIAGNOSIS — C50412 Malignant neoplasm of upper-outer quadrant of left female breast: Secondary | ICD-10-CM

## 2015-05-21 DIAGNOSIS — Z9221 Personal history of antineoplastic chemotherapy: Secondary | ICD-10-CM

## 2015-05-21 DIAGNOSIS — I10 Essential (primary) hypertension: Secondary | ICD-10-CM

## 2015-05-21 DIAGNOSIS — R5383 Other fatigue: Secondary | ICD-10-CM | POA: Diagnosis not present

## 2015-05-21 DIAGNOSIS — Z79899 Other long term (current) drug therapy: Secondary | ICD-10-CM | POA: Insufficient documentation

## 2015-05-21 DIAGNOSIS — Z7689 Persons encountering health services in other specified circumstances: Secondary | ICD-10-CM | POA: Diagnosis not present

## 2015-05-21 DIAGNOSIS — E119 Type 2 diabetes mellitus without complications: Secondary | ICD-10-CM | POA: Insufficient documentation

## 2015-05-21 DIAGNOSIS — Z171 Estrogen receptor negative status [ER-]: Secondary | ICD-10-CM | POA: Diagnosis not present

## 2015-05-21 DIAGNOSIS — C773 Secondary and unspecified malignant neoplasm of axilla and upper limb lymph nodes: Secondary | ICD-10-CM | POA: Insufficient documentation

## 2015-05-21 DIAGNOSIS — K5790 Diverticulosis of intestine, part unspecified, without perforation or abscess without bleeding: Secondary | ICD-10-CM | POA: Insufficient documentation

## 2015-05-21 DIAGNOSIS — K219 Gastro-esophageal reflux disease without esophagitis: Secondary | ICD-10-CM | POA: Insufficient documentation

## 2015-05-21 DIAGNOSIS — R5381 Other malaise: Secondary | ICD-10-CM

## 2015-05-21 DIAGNOSIS — Z5111 Encounter for antineoplastic chemotherapy: Secondary | ICD-10-CM | POA: Insufficient documentation

## 2015-05-21 DIAGNOSIS — Z923 Personal history of irradiation: Secondary | ICD-10-CM | POA: Insufficient documentation

## 2015-05-21 DIAGNOSIS — R590 Localized enlarged lymph nodes: Secondary | ICD-10-CM | POA: Diagnosis not present

## 2015-05-21 DIAGNOSIS — Z87891 Personal history of nicotine dependence: Secondary | ICD-10-CM | POA: Diagnosis not present

## 2015-05-21 LAB — CBC WITH DIFFERENTIAL/PLATELET
BASOS PCT: 2 %
Basophils Absolute: 0.1 10*3/uL (ref 0–0.1)
EOS PCT: 1 %
Eosinophils Absolute: 0 10*3/uL (ref 0–0.7)
HEMATOCRIT: 31.4 % — AB (ref 35.0–47.0)
Hemoglobin: 10.4 g/dL — ABNORMAL LOW (ref 12.0–16.0)
Lymphocytes Relative: 11 %
Lymphs Abs: 0.7 10*3/uL — ABNORMAL LOW (ref 1.0–3.6)
MCH: 27.4 pg (ref 26.0–34.0)
MCHC: 33.3 g/dL (ref 32.0–36.0)
MCV: 82.5 fL (ref 80.0–100.0)
MONO ABS: 0.4 10*3/uL (ref 0.2–0.9)
MONOS PCT: 7 %
NEUTROS ABS: 4.7 10*3/uL (ref 1.4–6.5)
Neutrophils Relative %: 79 %
PLATELETS: 276 10*3/uL (ref 150–440)
RBC: 3.81 MIL/uL (ref 3.80–5.20)
RDW: 23.3 % — AB (ref 11.5–14.5)
WBC: 6 10*3/uL (ref 3.6–11.0)

## 2015-05-21 LAB — COMPREHENSIVE METABOLIC PANEL
ALBUMIN: 3.8 g/dL (ref 3.5–5.0)
ALT: 19 U/L (ref 14–54)
ANION GAP: 7 (ref 5–15)
AST: 31 U/L (ref 15–41)
Alkaline Phosphatase: 64 U/L (ref 38–126)
BILIRUBIN TOTAL: 0.3 mg/dL (ref 0.3–1.2)
BUN: 12 mg/dL (ref 6–20)
CO2: 24 mmol/L (ref 22–32)
Calcium: 8.6 mg/dL — ABNORMAL LOW (ref 8.9–10.3)
Chloride: 103 mmol/L (ref 101–111)
Creatinine, Ser: 0.63 mg/dL (ref 0.44–1.00)
GFR calc Af Amer: >60 mL/min (ref >60)
GLUCOSE: 128 mg/dL — AB (ref 65–99)
POTASSIUM: 3.5 mmol/L (ref 3.5–5.1)
Sodium: 134 mmol/L — ABNORMAL LOW (ref 135–145)
TOTAL PROTEIN: 7.1 g/dL (ref 6.5–8.1)

## 2015-05-21 LAB — MAGNESIUM: MAGNESIUM: 1.5 mg/dL — AB (ref 1.7–2.4)

## 2015-05-21 MED ORDER — SODIUM CHLORIDE 0.9% FLUSH
10.0000 mL | INTRAVENOUS | Status: DC | PRN
  Administered 2015-05-21: 10 mL
  Filled 2015-05-21: qty 10

## 2015-05-21 MED ORDER — DOXORUBICIN HCL CHEMO IV INJECTION 2 MG/ML
60.0000 mg/m2 | Freq: Once | INTRAVENOUS | Status: AC
Start: 2015-05-21 — End: 2015-05-21
  Administered 2015-05-21: 112 mg via INTRAVENOUS
  Filled 2015-05-21: qty 56

## 2015-05-21 MED ORDER — SODIUM CHLORIDE 0.9 % IV SOLN
Freq: Once | INTRAVENOUS | Status: AC
  Administered 2015-05-21: 10:00:00 via INTRAVENOUS
  Filled 2015-05-21: qty 1000

## 2015-05-21 MED ORDER — SODIUM CHLORIDE 0.9 % IV SOLN
600.0000 mg/m2 | Freq: Once | INTRAVENOUS | Status: AC
  Administered 2015-05-21: 1120 mg via INTRAVENOUS
  Filled 2015-05-21: qty 50

## 2015-05-21 MED ORDER — SODIUM CHLORIDE 0.9 % IV SOLN: 2.0000 g | Freq: Once | INTRAVENOUS | Status: DC

## 2015-05-21 MED ORDER — HEPARIN SOD (PORK) LOCK FLUSH 100 UNIT/ML IV SOLN
500.0000 [IU] | Freq: Once | INTRAVENOUS | Status: AC | PRN
  Administered 2015-05-21: 500 [IU]
  Filled 2015-05-21: qty 5

## 2015-05-21 MED ORDER — SODIUM CHLORIDE 0.9 % IV SOLN
Freq: Once | INTRAVENOUS | Status: AC
  Administered 2015-05-21: 11:00:00 via INTRAVENOUS
  Filled 2015-05-21: qty 5

## 2015-05-21 MED ORDER — PEGFILGRASTIM 6 MG/0.6ML ~~LOC~~ PSKT
6.0000 mg | PREFILLED_SYRINGE | Freq: Once | SUBCUTANEOUS | Status: AC
  Administered 2015-05-21: 6 mg via SUBCUTANEOUS
  Filled 2015-05-21: qty 0.6

## 2015-05-21 MED ORDER — MAGNESIUM SULFATE 2 GM/50ML IV SOLN
2.0000 g | Freq: Once | INTRAVENOUS | Status: AC
  Administered 2015-05-21: 2 g via INTRAVENOUS
  Filled 2015-05-21: qty 50

## 2015-05-21 MED ORDER — PALONOSETRON HCL INJECTION 0.25 MG/5ML
0.2500 mg | Freq: Once | INTRAVENOUS | Status: AC
  Administered 2015-05-21: 0.25 mg via INTRAVENOUS
  Filled 2015-05-21: qty 5

## 2015-05-21 NOTE — Progress Notes (Addendum)
[place]  Telephone:(336) [phone]  Fax:(336) [phone]     B[place] WEISBECKER DOB: 1945/12/07  MR#: [id number]  MWN#:[id number]  Patient Care Team: [name], MD as PCP - General [name], MD (General Surgery) [name], MD (Oncology)  CHIEF COMPLAINT:  Chief Complaint  Patient presents with  . Breast Cancer    INTERVAL HISTORY:  Patient is here for further evaluation and treatment consideration regarding carcinoma of breast. Patient reports overall feeling very well. Patient is here today for her fourth and final cycle of Adriamycin and Cytoxan. She denies any numbness or tingling in the feet or hands, nausea, vomiting, shortness breath, or chest pain. Patient states that she has been tolerating treatment very well. She also has a significant past medical history of tonsillar cancer status post resection, radiation therapy, and chemotherapy.  REVIEW OF SYSTEMS:   Review of Systems  Constitutional: Positive for malaise/fatigue. Negative for fever, chills, weight loss and diaphoresis.  HENT: Negative.   Eyes: Negative.   Respiratory: Negative for cough, hemoptysis, sputum production, shortness of breath and wheezing.   Cardiovascular: Negative for chest pain, palpitations, orthopnea, claudication, leg swelling and PND.  Gastrointestinal: Negative for heartburn, nausea, vomiting, abdominal pain, diarrhea, constipation, blood in stool and melena.  Genitourinary: Negative.   Musculoskeletal: Negative.   Skin: Negative.   Neurological: Negative for dizziness, tingling, focal weakness, seizures and weakness.  Endo/Heme/Allergies: Does not bruise/bleed easily.  Psychiatric/Behavioral: Negative for depression. The patient is not nervous/anxious and does not have insomnia.     As per HPI. Otherwise, a complete review of systems is negatve.  ONCOLOGY HISTORY: Oncology History   1.Chief Complaint/Problem List:  Carcinoma of tonsils moderately  differentiated squamous cell carcinoma metastatic to lymph node, diagnosis in January of 2006.She is status post chemoradiation therapy and resection  2.ABNORMAL MAMMOGRAM OF THE LEFT BREAST. 5 CM TUMOR MASS BIOPSIES POSITIVE FOR INVASIVE MAMMARY CARCINOMA TRIPLE NEGATIVE DISEASE(jANUARY, 2017)stage IIIa. 3. MUGA scan of the heart shows ejection fraction to be 61% (January, 2017) 4  4. Patient will start chemotherapy with Cytoxan and Adriamycin from March 18, 2015     Cancer of left breast [facility])   03/01/2015 Initial Diagnosis Cancer of left breast [facility])    PAST MEDICAL HISTORY: Past Medical History  Diagnosis Date  . Hyperlipidemia   . Diverticulosis   . Shingles     Hx of  . Gastroparesis     related to previous radiation tx  . Anxiety   . Anemia     iron deficiency and B12 deficiency  . Cervical dysplasia     conization  . Diabetes mellitus without complication ([place])   . 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[place]) 2006  . Hypertension   . GERD (gastroesophageal reflux disease)   . Fever 04/11/2015    PAST SURGICAL HISTORY: Past Surgical History  Procedure Laterality Date  . Breast surgery      breast biopsy benign  . Appendectomy    . Tonsillectomy      cancer treated with chemo and radiation  . Cholecystectomy    . Mole removal    . Esophagogastroduodenoscopy  07/2009    normal with few gastric polyps  . Breast cyst aspiration    . Breast biopsy Left 02/25/2015    path pending  . Portacath placement    . Port-a-cath removal    . Radical neck dissection    . Portacath placement Right 03/12/2015    Procedure: INSERTION PORT-A-CATH;  Surgeon: [name],  MD;  Location: [facility] ORS;  Service: General;  Laterality: Right;    FAMILY HISTORY Family History  Problem Relation Age of Onset  . Osteoporosis Mother   . Hyperlipidemia Mother   . Depression Mother   . Cancer Mother     skin cancer ? basal cell and lung ca heavy smoker  . Alcohol abuse Father   . Cancer Father      skin CA ? basal cell  . Heart disease Father     CHF  . Hyperlipidemia Brother   . Hypertension Brother     GYNECOLOGIC HISTORY:  No LMP recorded. Patient is postmenopausal.     ADVANCED DIRECTIVES:    HEALTH MAINTENANCE: Social History  Substance Use Topics  . Smoking status: Former Smoker -- 0.50 packs/day for 6 years    Types: Cigarettes    Quit date: 03/10/1984  . Smokeless tobacco: Never Used  . Alcohol Use: No     Colonoscopy:  PAP:  Bone density:  Mammogram:  Allergies  Allergen Reactions  . Amoxicillin     REACTION: rash  . Fluconazole     REACTION: hives  . Gabapentin     depression    Current Outpatient Prescriptions  Medication Sig Dispense Refill  . ALPRAZolam (XANAX) 0.5 MG tablet Take 1 tablet (0.5 mg total) by mouth at bedtime as needed for sleep. 30 tablet 3  . atorvastatin (LIPITOR) 10 MG tablet Take 1 tablet (10 mg total) by mouth daily. (Patient taking differently: Take 10 mg by mouth daily at 6 PM. ) 30 tablet 5  . b complex vitamins tablet Take 1 tablet by mouth daily.      . cyanocobalamin (,VITAMIN B-12,) 1000 MCG/ML injection Inject 1,000 mcg into the muscle every 30 (thirty) days.      . ferrous sulfate 325 (65 FE) MG tablet Take 325 mg by mouth daily with breakfast.      . fluticasone (FLONASE) 50 MCG/ACT nasal spray instill 2 sprays into each nostril once daily 16 g 5  . glipiZIDE (GLUCOTROL) 10 MG tablet Take 20 mg by mouth daily before breakfast. Take 2 tabs daily on the day of chemotherapy and the day after chemo    . JANUMET XR 50-1000 MG TB24 Take 2 tablets by mouth daily.  0  . lidocaine-prilocaine (EMLA) cream APPLY TO AFFECTED AREA ONCE A DAY  0  . lisinopril (PRINIVIL,ZESTRIL) 5 MG tablet Take 1 tablet (5 mg total) by mouth daily. (Patient taking differently: Take 5 mg by mouth every morning. ) 30 tablet 5  . mirtazapine (REMERON) 15 MG tablet Take 45 mg by mouth at bedtime.     [name] omeprazole (PRILOSEC) 20 MG capsule Take 1 capsule  (20 mg total) by mouth daily. (Patient taking differently: Take 20 mg by mouth at bedtime. ) 30 capsule 5  . ondansetron (ZOFRAN) 8 MG tablet take 1 tablet by mouth twice a day (START ON THE 3RD DAY AFTER CHEMOTHERAPY) 30 tablet 1  . rOPINIRole (REQUIP) 1 MG tablet 1 pill in the am as needed by mouth and 3 pills at bedtime by mouth (Patient taking differently: Take 1 mg by mouth. 1 pill in the am as needed by mouth and 3 pills at bedtime by mouth) 120 tablet 5  . sertraline (ZOLOFT) 100 MG tablet Take 200 mg by mouth daily.  0   No current facility-administered medications for this visit.    OBJECTIVE: BP 115/71 mmHg  Pulse 80  Temp(Src) 96.3 F (35.7  C) (Tympanic)  Resp 18  Wt 211 lb 6.7 oz (95.9 kg)   Body mass index is 34.14 kg/(m^2).    ECOG FS:1 - Symptomatic but completely ambulatory  General: Well-developed, well-nourished, no acute distress. Eyes: Pink conjunctiva, anicteric sclera. HEENT: Normocephalic, moist mucous membranes, clear oropharnyx. Lungs: Clear to auscultation bilaterally. Heart: Regular rate and rhythm. No rubs, murmurs, or gallops. Musculoskeletal: No edema, cyanosis, or clubbing. Neuro: Alert, answering all questions appropriately. Cranial nerves grossly intact. Skin: No rashes or petechiae noted. Psych: Normal affect. Lymphatics: No cervical, clavicular LAD.   LAB RESULTS:  Appointment on 05/21/2015  Component Date Value Ref Range Status  . WBC 05/21/2015 6.0  3.6 - 11.0 K/uL Final  . RBC 05/21/2015 3.81  3.80 - 5.20 MIL/uL Final  . Hemoglobin 05/21/2015 10.4* 12.0 - 16.0 g/dL Final  . HCT 05/21/2015 31.4* 35.0 - 47.0 % Final  . MCV 05/21/2015 82.5  80.0 - 100.0 fL Final  . MCH 05/21/2015 27.4  26.0 - 34.0 pg Final  . MCHC 05/21/2015 33.3  32.0 - 36.0 g/dL Final  . RDW 05/21/2015 23.3* 11.5 - 14.5 % Final  . Platelets 05/21/2015 276  150 - 440 K/uL Final  . Neutrophils Relative % 05/21/2015 79   Final  . Neutro Abs 05/21/2015 4.7  1.4 - 6.5 K/uL  Final  . Lymphocytes Relative 05/21/2015 11   Final  . Lymphs Abs 05/21/2015 0.7* 1.0 - 3.6 K/uL Final  . Monocytes Relative 05/21/2015 7   Final  . Monocytes Absolute 05/21/2015 0.4  0.2 - 0.9 K/uL Final  . Eosinophils Relative 05/21/2015 1   Final  . Eosinophils Absolute 05/21/2015 0.0  0 - 0.7 K/uL Final  . Basophils Relative 05/21/2015 2   Final  . Basophils Absolute 05/21/2015 0.1  0 - 0.1 K/uL Final  . Sodium 05/21/2015 134* 135 - 145 mmol/L Final  . Potassium 05/21/2015 3.5  3.5 - 5.1 mmol/L Final  . Chloride 05/21/2015 103  101 - 111 mmol/L Final  . CO2 05/21/2015 24  22 - 32 mmol/L Final  . Glucose, Bld 05/21/2015 128* 65 - 99 mg/dL Final  . BUN 05/21/2015 12  6 - 20 mg/dL Final  . Creatinine, Ser 05/21/2015 0.63  0.44 - 1.00 mg/dL Final  . Calcium 05/21/2015 8.6* 8.9 - 10.3 mg/dL Final  . Total Protein 05/21/2015 7.1  6.5 - 8.1 g/dL Final  . Albumin 05/21/2015 3.8  3.5 - 5.0 g/dL Final  . AST 05/21/2015 31  15 - 41 U/L Final  . ALT 05/21/2015 19  14 - 54 U/L Final  . Alkaline Phosphatase 05/21/2015 64  38 - 126 U/L Final  . Total Bilirubin 05/21/2015 0.3  0.3 - 1.2 mg/dL Final  . GFR calc non Af Amer 05/21/2015 >60  >60 mL/min Final  . GFR calc Af Amer 05/21/2015 >60  >60 mL/min Final   Comment: (NOTE) The eGFR has been calculated using the CKD EPI equation. This calculation has not been validated in all clinical situations. eGFR's persistently <60 mL/min signify possible Chronic Kidney Disease.   . Anion gap 05/21/2015 7  5 - 15 Final  . Magnesium 05/21/2015 1.5* 1.7 - 2.4 mg/dL Final    STUDIES: No results found.  ASSESSMENT:  Carcinoma of left upper outer quadrant breast, T3 N1 M0, stage IIIa.  PLAN:   1. Carcinoma of breast. Triple negative disease. Overall patient is tolerating treatment very well and has had decrease in size of breast mass. We'll proceed with  cycle 4 of 4 chemotherapy with Adriamycin and Cytoxan. Per notes from Dr. Oliva Bustard, patient to begin  carboplatin and Taxol chemotherapy in 3 weeks.  Neulasta to be administered for prevention of neutropenia. We will continue with the weekly lab rechecks and continue with follow-up with Dr. Oliva Bustard and for carbo/taxol in 3 weeks.  Patient expressed understanding and was in agreement with this plan. She also understands that She can call clinic at any time with any questions, concerns, or complaints.   Dr. Grayland Ormond was available for consultation and review of plan of care for this patient.  Cancer of left breast [facility])   Staging form: Breast, AJCC 7th Edition     Clinical: Stage IIIA (T3, N1, M0) - Signed by [name], MD on 03/01/2015   Evlyn Kanner, NP   05/21/2015 1:24 PM

## 2015-05-21 NOTE — Progress Notes (Signed)
  Oncology Nurse Navigator Documentation  Navigator Location: [facility] (05/21/15 1000) Navigator Encounter Type: Treatment (05/21/15 1000)           Patient Visit Type: Follow-up (05/21/15 1000) Treatment Phase: Other (chemo) (05/21/15 1000)                            Time Spent with Patient: 15 (05/21/15 1000)   Met patient during her chemo treatment today.  She is doing well.  No needs at this time.  She is to call if she has any questions or needs.

## 2015-05-28 ENCOUNTER — Inpatient Hospital Stay: Payer: Medicare Other

## 2015-05-28 ENCOUNTER — Other Ambulatory Visit: Payer: Medicare Other

## 2015-05-29 ENCOUNTER — Inpatient Hospital Stay: Payer: Medicare Other

## 2015-05-29 ENCOUNTER — Telehealth: Payer: Self-pay | Admitting: *Deleted

## 2015-05-29 DIAGNOSIS — C50412 Malignant neoplasm of upper-outer quadrant of left female breast: Secondary | ICD-10-CM

## 2015-05-29 DIAGNOSIS — C773 Secondary and unspecified malignant neoplasm of axilla and upper limb lymph nodes: Secondary | ICD-10-CM | POA: Diagnosis not present

## 2015-05-29 DIAGNOSIS — R590 Localized enlarged lymph nodes: Secondary | ICD-10-CM | POA: Diagnosis not present

## 2015-05-29 DIAGNOSIS — Z171 Estrogen receptor negative status [ER-]: Secondary | ICD-10-CM | POA: Diagnosis not present

## 2015-05-29 DIAGNOSIS — Z5111 Encounter for antineoplastic chemotherapy: Secondary | ICD-10-CM | POA: Diagnosis not present

## 2015-05-29 DIAGNOSIS — Z7689 Persons encountering health services in other specified circumstances: Secondary | ICD-10-CM | POA: Diagnosis not present

## 2015-05-29 LAB — CBC WITH DIFFERENTIAL/PLATELET
BASOS ABS: 0 10*3/uL (ref 0–0.1)
EOS ABS: 0.1 10*3/uL (ref 0–0.7)
HCT: 28.4 % — ABNORMAL LOW (ref 35.0–47.0)
Hemoglobin: 9.6 g/dL — ABNORMAL LOW (ref 12.0–16.0)
Lymphocytes Relative: 26 %
Lymphs Abs: 0.3 10*3/uL — ABNORMAL LOW (ref 1.0–3.6)
MCH: 27.9 pg (ref 26.0–34.0)
MCHC: 33.7 g/dL (ref 32.0–36.0)
MCV: 82.6 fL (ref 80.0–100.0)
MONO ABS: 0.2 10*3/uL (ref 0.2–0.9)
Monocytes Relative: 15 %
Neutro Abs: 0.6 10*3/uL — ABNORMAL LOW (ref 1.4–6.5)
Neutrophils Relative %: 53 %
PLATELETS: 153 10*3/uL (ref 150–440)
RBC: 3.44 MIL/uL — AB (ref 3.80–5.20)
RDW: 22.3 % — AB (ref 11.5–14.5)
WBC: 1.1 10*3/uL — AB (ref 3.6–11.0)

## 2015-05-29 MED ORDER — LEVOFLOXACIN 500 MG PO TABS: 500.0000 mg | ORAL_TABLET | Freq: Every day | ORAL | Status: DC

## 2015-05-29 NOTE — Telephone Encounter (Signed)
Per Dr. Rogue Bussing will watch blood counts but can call in prescription for levaquin for pt to take only if develops fever. Message left with patient regarding neutropenic precautions and that will have prescription at the pharmacy in case she develops a fever.

## 2015-06-04 ENCOUNTER — Inpatient Hospital Stay: Payer: Medicare Other

## 2015-06-04 ENCOUNTER — Other Ambulatory Visit: Payer: Medicare Other

## 2015-06-04 DIAGNOSIS — C50412 Malignant neoplasm of upper-outer quadrant of left female breast: Secondary | ICD-10-CM | POA: Diagnosis not present

## 2015-06-04 DIAGNOSIS — Z5111 Encounter for antineoplastic chemotherapy: Secondary | ICD-10-CM | POA: Diagnosis not present

## 2015-06-04 DIAGNOSIS — C773 Secondary and unspecified malignant neoplasm of axilla and upper limb lymph nodes: Secondary | ICD-10-CM | POA: Diagnosis not present

## 2015-06-04 DIAGNOSIS — Z7689 Persons encountering health services in other specified circumstances: Secondary | ICD-10-CM | POA: Diagnosis not present

## 2015-06-04 DIAGNOSIS — Z171 Estrogen receptor negative status [ER-]: Secondary | ICD-10-CM | POA: Diagnosis not present

## 2015-06-04 DIAGNOSIS — R590 Localized enlarged lymph nodes: Secondary | ICD-10-CM | POA: Diagnosis not present

## 2015-06-04 LAB — CBC WITH DIFFERENTIAL/PLATELET
BASOS ABS: 0 10*3/uL (ref 0–0.1)
BASOS PCT: 1 %
Eosinophils Absolute: 0.1 10*3/uL (ref 0–0.7)
Eosinophils Relative: 2 %
HEMATOCRIT: 30.3 % — AB (ref 35.0–47.0)
HEMOGLOBIN: 9.9 g/dL — AB (ref 12.0–16.0)
LYMPHS PCT: 7 %
Lymphs Abs: 0.6 10*3/uL — ABNORMAL LOW (ref 1.0–3.6)
MCH: 28 pg (ref 26.0–34.0)
MCHC: 32.7 g/dL (ref 32.0–36.0)
MCV: 85.5 fL (ref 80.0–100.0)
Monocytes Absolute: 0.5 10*3/uL (ref 0.2–0.9)
Monocytes Relative: 6 %
NEUTROS ABS: 7.2 10*3/uL — AB (ref 1.4–6.5)
NEUTROS PCT: 84 %
Platelets: 157 10*3/uL (ref 150–440)
RBC: 3.55 MIL/uL — ABNORMAL LOW (ref 3.80–5.20)
RDW: 23.8 % — ABNORMAL HIGH (ref 11.5–14.5)
WBC: 8.4 10*3/uL (ref 3.6–11.0)

## 2015-06-11 ENCOUNTER — Inpatient Hospital Stay: Payer: Medicare Other

## 2015-06-11 ENCOUNTER — Inpatient Hospital Stay (HOSPITAL_BASED_OUTPATIENT_CLINIC_OR_DEPARTMENT_OTHER): Payer: Medicare Other | Admitting: Oncology

## 2015-06-11 ENCOUNTER — Encounter: Payer: Self-pay | Admitting: Oncology

## 2015-06-11 ENCOUNTER — Encounter: Payer: Self-pay | Admitting: *Deleted

## 2015-06-11 ENCOUNTER — Ambulatory Visit: Payer: Medicare Other

## 2015-06-11 VITALS — BP 145/75 | HR 99 | Temp 96.8°F | Resp 18 | Wt 212.1 lb

## 2015-06-11 DIAGNOSIS — C50412 Malignant neoplasm of upper-outer quadrant of left female breast: Secondary | ICD-10-CM

## 2015-06-11 DIAGNOSIS — Z79899 Other long term (current) drug therapy: Secondary | ICD-10-CM

## 2015-06-11 DIAGNOSIS — Z171 Estrogen receptor negative status [ER-]: Secondary | ICD-10-CM

## 2015-06-11 DIAGNOSIS — E119 Type 2 diabetes mellitus without complications: Secondary | ICD-10-CM

## 2015-06-11 DIAGNOSIS — Z5111 Encounter for antineoplastic chemotherapy: Secondary | ICD-10-CM | POA: Diagnosis not present

## 2015-06-11 DIAGNOSIS — Z923 Personal history of irradiation: Secondary | ICD-10-CM

## 2015-06-11 DIAGNOSIS — Z9221 Personal history of antineoplastic chemotherapy: Secondary | ICD-10-CM

## 2015-06-11 DIAGNOSIS — Z7689 Persons encountering health services in other specified circumstances: Secondary | ICD-10-CM | POA: Diagnosis not present

## 2015-06-11 DIAGNOSIS — C773 Secondary and unspecified malignant neoplasm of axilla and upper limb lymph nodes: Secondary | ICD-10-CM

## 2015-06-11 DIAGNOSIS — Z87891 Personal history of nicotine dependence: Secondary | ICD-10-CM

## 2015-06-11 DIAGNOSIS — Z85818 Personal history of malignant neoplasm of other sites of lip, oral cavity, and pharynx: Secondary | ICD-10-CM

## 2015-06-11 DIAGNOSIS — R590 Localized enlarged lymph nodes: Secondary | ICD-10-CM

## 2015-06-11 DIAGNOSIS — E785 Hyperlipidemia, unspecified: Secondary | ICD-10-CM

## 2015-06-11 DIAGNOSIS — I10 Essential (primary) hypertension: Secondary | ICD-10-CM

## 2015-06-11 LAB — CBC WITH DIFFERENTIAL/PLATELET
BASOS PCT: 1 %
Basophils Absolute: 0.1 10*3/uL (ref 0–0.1)
EOS PCT: 1 %
Eosinophils Absolute: 0 10*3/uL (ref 0–0.7)
HEMATOCRIT: 30.6 % — AB (ref 35.0–47.0)
Hemoglobin: 10.3 g/dL — ABNORMAL LOW (ref 12.0–16.0)
Lymphocytes Relative: 10 %
Lymphs Abs: 0.6 10*3/uL — ABNORMAL LOW (ref 1.0–3.6)
MCH: 28.5 pg (ref 26.0–34.0)
MCHC: 33.7 g/dL (ref 32.0–36.0)
MCV: 84.5 fL (ref 80.0–100.0)
MONO ABS: 0.4 10*3/uL (ref 0.2–0.9)
MONOS PCT: 7 %
NEUTROS ABS: 4.7 10*3/uL (ref 1.4–6.5)
Neutrophils Relative %: 81 %
PLATELETS: 254 10*3/uL (ref 150–440)
RBC: 3.62 MIL/uL — ABNORMAL LOW (ref 3.80–5.20)
RDW: 23.5 % — AB (ref 11.5–14.5)
WBC: 5.8 10*3/uL (ref 3.6–11.0)

## 2015-06-11 LAB — COMPREHENSIVE METABOLIC PANEL
ALBUMIN: 3.7 g/dL (ref 3.5–5.0)
ALT: 21 U/L (ref 14–54)
ANION GAP: 7 (ref 5–15)
AST: 29 U/L (ref 15–41)
Alkaline Phosphatase: 70 U/L (ref 38–126)
BILIRUBIN TOTAL: 0.4 mg/dL (ref 0.3–1.2)
BUN: 11 mg/dL (ref 6–20)
CHLORIDE: 106 mmol/L (ref 101–111)
CO2: 25 mmol/L (ref 22–32)
Calcium: 9 mg/dL (ref 8.9–10.3)
Creatinine, Ser: 0.66 mg/dL (ref 0.44–1.00)
GFR calc Af Amer: >60 mL/min (ref >60)
GLUCOSE: 150 mg/dL — AB (ref 65–99)
POTASSIUM: 3.6 mmol/L (ref 3.5–5.1)
Sodium: 138 mmol/L (ref 135–145)
TOTAL PROTEIN: 6.9 g/dL (ref 6.5–8.1)

## 2015-06-11 LAB — MAGNESIUM: MAGNESIUM: 1.4 mg/dL — AB (ref 1.7–2.4)

## 2015-06-11 LAB — BILIRUBIN, TOTAL: Total Bilirubin: 0.5 mg/dL (ref 0.3–1.2)

## 2015-06-11 MED ORDER — MAGNESIUM SULFATE 2 GM/50ML IV SOLN
2.0000 g | Freq: Once | INTRAVENOUS | Status: AC
Start: 2015-06-11 — End: 2015-06-11
  Administered 2015-06-11: 2 g via INTRAVENOUS
  Filled 2015-06-11: qty 50

## 2015-06-11 MED ORDER — HEPARIN SOD (PORK) LOCK FLUSH 100 UNIT/ML IV SOLN: 500.0000 [IU] | Freq: Once | INTRAVENOUS | Status: DC | PRN

## 2015-06-11 MED ORDER — DEXAMETHASONE SODIUM PHOSPHATE 100 MG/10ML IJ SOLN
10.0000 mg | Freq: Once | INTRAMUSCULAR | Status: AC
  Administered 2015-06-11: 10 mg via INTRAVENOUS
  Filled 2015-06-11: qty 1

## 2015-06-11 MED ORDER — DIPHENHYDRAMINE HCL 50 MG/ML IJ SOLN
50.0000 mg | Freq: Once | INTRAMUSCULAR | Status: AC
  Administered 2015-06-11: 50 mg via INTRAVENOUS
  Filled 2015-06-11: qty 1

## 2015-06-11 MED ORDER — SODIUM CHLORIDE 0.9 % IV SOLN
Freq: Once | INTRAVENOUS | Status: AC
  Administered 2015-06-11: 10:00:00 via INTRAVENOUS
  Filled 2015-06-11: qty 1000

## 2015-06-11 MED ORDER — SODIUM CHLORIDE 0.9% FLUSH
10.0000 mL | INTRAVENOUS | Status: DC | PRN
  Filled 2015-06-11: qty 10

## 2015-06-11 MED ORDER — SODIUM CHLORIDE 0.9 % IV SOLN
537.5000 mg | Freq: Once | INTRAVENOUS | Status: AC
  Administered 2015-06-11: 540 mg via INTRAVENOUS
  Filled 2015-06-11: qty 54

## 2015-06-11 MED ORDER — PACLITAXEL CHEMO INJECTION 300 MG/50ML
80.0000 mg/m2 | Freq: Once | INTRAVENOUS | Status: AC
  Administered 2015-06-11: 168 mg via INTRAVENOUS
  Filled 2015-06-11: qty 28

## 2015-06-11 MED ORDER — FAMOTIDINE IN NACL 20-0.9 MG/50ML-% IV SOLN
20.0000 mg | Freq: Once | INTRAVENOUS | Status: AC
  Administered 2015-06-11: 20 mg via INTRAVENOUS
  Filled 2015-06-11: qty 50

## 2015-06-11 MED ORDER — PALONOSETRON HCL INJECTION 0.25 MG/5ML
0.2500 mg | Freq: Once | INTRAVENOUS | Status: AC
  Administered 2015-06-11: 0.25 mg via INTRAVENOUS
  Filled 2015-06-11: qty 5

## 2015-06-11 MED ORDER — HEPARIN SOD (PORK) LOCK FLUSH 100 UNIT/ML IV SOLN
500.0000 [IU] | Freq: Once | INTRAVENOUS | Status: AC
  Administered 2015-06-11: 500 [IU] via INTRAVENOUS
  Filled 2015-06-11: qty 5

## 2015-06-11 NOTE — Progress Notes (Signed)
  Oncology Nurse Navigator Documentation  Navigator Location: [facility] (06/11/15 1200) Navigator Encounter Type: Treatment (06/11/15 1200)           Patient Visit Type:  (chemo) (06/11/15 1200) Treatment Phase: Active Tx (06/11/15 1200)                            Time Spent with Patient: 15 (06/11/15 1200)   Met patient during her chemo treatment today.  States she is doing well.  No needs at this time.  She is to call if she has any questions or needs.

## 2015-06-11 NOTE — Progress Notes (Signed)
[place] @ [facility] Telephone:(336) [phone]  Fax:(336) [place] OB: [birth year]  MR#: [id number]  [id number]  Patient Care Team: [name], MD as PCP - General [name], MD (General Surgery) [name], MD (Oncology)  CHIEF COMPLAINT:  No chief complaint on file.  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  5 CM TUMOR MASS BIOPSIES POSITIVE FOR INVASIVE MAMMARY CARCINOMA TRIPLE NEGATIVE DISEASE(jANUARY, 2017)stage IIIa   VISIT DIAGNOSIS:   No diagnosis found.    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 5 CM TUMOR MASS BIOPSIES POSITIVE FOR INVASIVE MAMMARY CARCINOMA TRIPLE NEGATIVE DISEASE(jANUARY, 2017)stage IIIa. 3. MUGA scan of the heart shows ejection fraction to be 61% (January, 2017) 4  4. Patient will start chemotherapy with Cytoxan and Adriamycin from March 18, 2015    5.  Patient   finished Cytoxan and Adriamycin for 4 cycles on fifth April, 2017 6.  Starting carboplatinum and Taxol 3 from June 11, 2015  INTERVAL HISTORY: 71-YEAR-OLD LADY WHO HAD BEEN PREVIOUSLY SEEN BY ME FOR TONSILLAR CANCER STATUS POST RESECTION AND RADIATION AND CHEMOTHERAPY. According to patient she was getting mammograms done on a regular basis however looking at old records do not have any mammogram available for review until January of 2017 Patient is here for ongoing evaluation and treatment consideration.  Overall feeling better.  No nausea no vomiting or diarrhea.  Appetite has been  improving.  No tingling.  No numbness.  No chills.  No fever.  Pain is getting better.  REVIEW OF SYSTEMS:   GENERAL:  Feels good.  Active.  No fevers, sweats or weight loss. PERFORMANCE STATUS (ECOG):  0-1 HEENT:  No visual changes, runny nose, sore throat, mouth sores or tenderness. had a recurrent sinus infection and and a previous history of tonsillar cancer Lungs: No shortness of breath or cough.  No hemoptysis. Cardiac:  No chest pain, palpitations, orthopnea, or PND. GI:  No nausea, vomiting, diarrhea, constipation, melena or hematochezia. GU:  No urgency, frequency, dysuria, or hematuria. Musculoskeletal:  No back pain.  No joint pain.  No muscle tenderness. Extremities:  No pain or swelling. Skin:  No rashes or skin changes. Neuro:  No headache, numbness or weakness, balance or coordination issues. Endocrine:  No diabetes, thyroid issues, hot flashes or night sweats. Psych:  No mood changes, depression or anxiety. Pain:  No focal pain. Review of systems:  All other systems reviewed and found to be negative.  As per HPI. Otherwise, a complete review of systems is negatve.  PAST MEDICAL HISTORY: Past Medical History  Diagnosis Date  . Hyperlipidemia   . Diverticulosis   . Shingles     Hx of  . Gastroparesis     related to previous radiation tx  . Anxiety   . Anemia     iron deficiency and B12 deficiency  . Cervical dysplasia     conization  . Diabetes mellitus without complication ([place])   . 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[place]) 2006  . Hypertension   . GERD (gastroesophageal reflux disease)   . Fever 04/11/2015    PAST SURGICAL HISTORY: Past Surgical  History  Procedure Laterality Date  . Breast surgery      breast biopsy benign  . Appendectomy    . Tonsillectomy      cancer treated with chemo and radiation  . Cholecystectomy    . Mole removal    . Esophagogastroduodenoscopy  07/2009    normal with few gastric polyps  . Breast cyst aspiration    . Breast biopsy Left  02/25/2015    path pending  . Portacath placement    . Port-a-cath removal    . Radical neck dissection    . Portacath placement Right 03/12/2015    Procedure: INSERTION PORT-A-CATH;  Surgeon: [name], MD;  Location: [facility] ORS;  Service: General;  Laterality: Right;    FAMILY HISTORY Family History  Problem Relation Age of Onset  . Osteoporosis Mother   . Hyperlipidemia Mother   . Depression Mother   . Cancer Mother     skin cancer ? basal cell and lung ca heavy smoker  . Alcohol abuse Father   . Cancer Father     skin CA ? basal cell  . Heart disease Father     CHF  . Hyperlipidemia Brother   . Hypertension Brother    Significant History/PMH:   Hypercholesterolemia:    tonsil cancer:    Sinus Surgery:    breast biopsy-negative: 2000   cone biopsy for cervical dysplasia 1980's:    Cholecystectomy: 2000   Appendectomy: 1970  Preventive Screening:  Has patient had any of the following test?as per patient as he was getting regular mammogram done but we cannot find any record in   [place] breast cancer Mammography (1)  Smoking History: Smoking History quit 1980's (early)(1)  PFSH: Additional Past Medical and Surgical History: Past Medical History  Hypercholesterolemia.    Past Surgical History:  Appendectomy in 1970.  Gallbladder removal in 2000.  Cone biopsy for cervical dysplasia in the early 80's.  Breast biopsy in early 2000 which was negative.    Family History:  There is a family history of heart disease. She denies a family history of any cancer, diabetes, or any bleeding disorders.    Social History:  She is married. She does not have any children. She works at a [employer]. She has a history of smoking about 1 pack every 2 days from 1968-1990. She denies any alcohol use. She denies any recreational drug use. She denies any radiation exposure         ADVANCED DIRECTIVES:    HEALTH MAINTENANCE: Social History  Substance  Use Topics  . Smoking status: Former Smoker -- 0.50 packs/day for 6 years    Types: Cigarettes    Quit date: 03/10/1984  . Smokeless tobacco: Never Used  . Alcohol Use: No      Allergies  Allergen Reactions  . Amoxicillin     REACTION: rash  . Fluconazole     REACTION: hives  . Gabapentin     depression    Current Outpatient Prescriptions  Medication Sig Dispense Refill  . ALPRAZolam (XANAX) 0.5 MG tablet Take 1 tablet (0.5 mg total) by mouth at bedtime as needed for sleep. 30 tablet 3  . atorvastatin (LIPITOR) 10 MG tablet Take 1 tablet (10 mg total) by mouth daily. (Patient taking differently: Take 10 mg by mouth daily at 6 PM. ) 30 tablet 5  . b complex vitamins tablet Take 1 tablet by mouth daily.      . cyanocobalamin (,VITAMIN B-12,) 1000 MCG/ML  injection Inject 1,000 mcg into the muscle every 30 (thirty) days.      . ferrous sulfate 325 (65 FE) MG tablet Take 325 mg by mouth daily with breakfast.      . fluticasone (FLONASE) 50 MCG/ACT nasal spray instill 2 sprays into each nostril once daily 16 g 5  . glipiZIDE (GLUCOTROL) 10 MG tablet Take 20 mg by mouth daily before breakfast. Take 2 tabs daily on the day of chemotherapy and the day after chemo    . JANUMET XR 50-1000 MG TB24 Take 2 tablets by mouth daily.  0  . levofloxacin (LEVAQUIN) 500 MG tablet Take 1 tablet (500 mg total) by mouth daily. 7 tablet 0  . lidocaine-prilocaine (EMLA) cream APPLY TO AFFECTED AREA ONCE A DAY  0  . lisinopril (PRINIVIL,ZESTRIL) 5 MG tablet Take 1 tablet (5 mg total) by mouth daily. (Patient taking differently: Take 5 mg by mouth every morning. ) 30 tablet 5  . mirtazapine (REMERON) 15 MG tablet Take 45 mg by mouth at bedtime.     [name] omeprazole (PRILOSEC) 20 MG capsule Take 1 capsule (20 mg total) by mouth daily. (Patient taking differently: Take 20 mg by mouth at bedtime. ) 30 capsule 5  . ondansetron (ZOFRAN) 8 MG tablet take 1 tablet by mouth twice a day (START ON THE 3RD DAY AFTER  CHEMOTHERAPY) 30 tablet 1  . rOPINIRole (REQUIP) 1 MG tablet 1 pill in the am as needed by mouth and 3 pills at bedtime by mouth (Patient taking differently: Take 1 mg by mouth. 1 pill in the am as needed by mouth and 3 pills at bedtime by mouth) 120 tablet 5  . sertraline (ZOLOFT) 100 MG tablet Take 200 mg by mouth daily.  0   No current facility-administered medications for this visit.    OBJECTIVE: PHYSICAL EXAM: Gen. Status: Patient is alert oriented not in any acute distress HEENT: Patient previously had tonsillar cancer status post chemoradiation therapy and resection.  There is no evidence of recurrent disease Lymphatic system: Supraclavicular, cervical,  inguinal lymph nodes are not palpable, There might be palpable left axillary lymph node. Examination of the chest was unremarkable. There were no bony deformities, no asymmetry, and no other abnormalities.  Cardiac exam revealed the PMI to be normally situated and sized. The rhythm was regular and no extrasystoles were noted during several minutes of auscultation. The first and second heart sounds were normal and physiologic splitting of the second heart sound was noted. There were no murmurs, rubs, clicks, or gallops. Neurologically, the patient was awake, alert, and oriented to person, place and time. There were no obvious focal neurologic abnormalities. Examination of breasts: Right breast free of masses.  Left breast palpable mass 4.5-5 cm.  There is redness on the skin.  That might be a palpable left axillary lymph node  There were no vitals filed for this visit.   There is no weight on file to calculate BMI.    ECOG FS:1 - Symptomatic but completely ambulatory  LAB RESULTS:     STUDIES: No results found.  ASSESSMENT: carcinoma of breast.  Left upper and outer quadrant in size of tumor. Palpable axillary lymph node initial biopsy of lymph node was negative however there is strong radiological suspicion of the lymph node  involvement. There is redness on the skin which according to patient started after biopsy. If it persists or gets worse antibiotic therapy or biopsy of the skin to rule out inflammatory carcinoma may be  needed. Based on now mammogram ultrasound patient has been staged as IIIa disease Patient has had triple negative disease  2. Previous history of tonsillar cancer diagnosed in 2006 without any evidence of recurrent disease status post cis-platinum and 5-FU chemotherapy    PLAN:   All lab data, pathology as well as ultrasound and mammogram has been reviewed  We will make an appointment for Dr. Bary Castilla to see the patient. I discussed with the patient local therapy as well as systemic therapy Patient may be a candidate for neoadjuvant therapy depending on the further staging results from the PET scan  Whether we need MRI scan of breast or not will be decided after consultation with Dr Bary Castilla.  Patient will undergo a MUGA scan.  If we decide to go with neoadjuvant chemotherapy patient would need port placement And will have to attend the chemotherapy class arrangements are being made after reviewing PET scan.  Patient has a good response on clinical examination we will get an ultrasound done in Dr. Dwyane Luo office.  We will treat start patient on carboplatinum and Taxol plan will be to give 3 cycles of treatment on carboplatinum on day 1 and Taxol on day 1, 8, 15  Side effect of chemotherapy has been discussed and informed consent has been obtained We did not find any previous mammogram done at [facility].  So we will call her primary care physician and if find out whether any mammogram has been done at outside institution.  As patient is very sure that the mammogram was done last year  Patient expressed understanding and was in agreement with this plan. She also understands that She can call clinic at any time with any questions, concerns, or complaints.    No matching staging information was  found for the patient.  [name], MD   06/11/2015 8:20 AM

## 2015-06-19 ENCOUNTER — Inpatient Hospital Stay: Payer: Medicare Other

## 2015-06-19 ENCOUNTER — Inpatient Hospital Stay: Payer: Medicare Other | Attending: Oncology

## 2015-06-19 ENCOUNTER — Ambulatory Visit: Payer: Medicare Other

## 2015-06-19 VITALS — BP 122/72 | HR 93 | Temp 97.7°F | Resp 18

## 2015-06-19 DIAGNOSIS — Z79899 Other long term (current) drug therapy: Secondary | ICD-10-CM | POA: Insufficient documentation

## 2015-06-19 DIAGNOSIS — Z87891 Personal history of nicotine dependence: Secondary | ICD-10-CM | POA: Insufficient documentation

## 2015-06-19 DIAGNOSIS — Z923 Personal history of irradiation: Secondary | ICD-10-CM | POA: Insufficient documentation

## 2015-06-19 DIAGNOSIS — R110 Nausea: Secondary | ICD-10-CM | POA: Diagnosis not present

## 2015-06-19 DIAGNOSIS — E119 Type 2 diabetes mellitus without complications: Secondary | ICD-10-CM | POA: Diagnosis not present

## 2015-06-19 DIAGNOSIS — Z171 Estrogen receptor negative status [ER-]: Secondary | ICD-10-CM | POA: Diagnosis not present

## 2015-06-19 DIAGNOSIS — C50412 Malignant neoplasm of upper-outer quadrant of left female breast: Secondary | ICD-10-CM

## 2015-06-19 DIAGNOSIS — Z85818 Personal history of malignant neoplasm of other sites of lip, oral cavity, and pharynx: Secondary | ICD-10-CM | POA: Diagnosis not present

## 2015-06-19 DIAGNOSIS — R040 Epistaxis: Secondary | ICD-10-CM | POA: Diagnosis not present

## 2015-06-19 DIAGNOSIS — Z5111 Encounter for antineoplastic chemotherapy: Secondary | ICD-10-CM | POA: Insufficient documentation

## 2015-06-19 DIAGNOSIS — E785 Hyperlipidemia, unspecified: Secondary | ICD-10-CM | POA: Diagnosis not present

## 2015-06-19 DIAGNOSIS — Z9221 Personal history of antineoplastic chemotherapy: Secondary | ICD-10-CM | POA: Diagnosis not present

## 2015-06-19 LAB — CBC WITH DIFFERENTIAL/PLATELET
BASOS ABS: 0.1 10*3/uL (ref 0–0.1)
BASOS PCT: 1 %
EOS PCT: 2 %
Eosinophils Absolute: 0.1 10*3/uL (ref 0–0.7)
HCT: 28.2 % — ABNORMAL LOW (ref 35.0–47.0)
Hemoglobin: 9.7 g/dL — ABNORMAL LOW (ref 12.0–16.0)
LYMPHS PCT: 11 %
Lymphs Abs: 0.5 10*3/uL — ABNORMAL LOW (ref 1.0–3.6)
MCH: 29.2 pg (ref 26.0–34.0)
MCHC: 34.3 g/dL (ref 32.0–36.0)
MCV: 85.1 fL (ref 80.0–100.0)
Monocytes Absolute: 0.2 10*3/uL (ref 0.2–0.9)
Monocytes Relative: 6 %
NEUTROS ABS: 3.2 10*3/uL (ref 1.4–6.5)
Neutrophils Relative %: 80 %
PLATELETS: 222 10*3/uL (ref 150–440)
RBC: 3.31 MIL/uL — AB (ref 3.80–5.20)
RDW: 22.5 % — AB (ref 11.5–14.5)
WBC: 4 10*3/uL (ref 3.6–11.0)

## 2015-06-19 LAB — COMPREHENSIVE METABOLIC PANEL
ALBUMIN: 3.8 g/dL (ref 3.5–5.0)
ALT: 29 U/L (ref 14–54)
AST: 37 U/L (ref 15–41)
Alkaline Phosphatase: 72 U/L (ref 38–126)
Anion gap: 10 (ref 5–15)
BUN: 15 mg/dL (ref 6–20)
CHLORIDE: 102 mmol/L (ref 101–111)
CO2: 24 mmol/L (ref 22–32)
CREATININE: 0.63 mg/dL (ref 0.44–1.00)
Calcium: 9 mg/dL (ref 8.9–10.3)
GFR calc Af Amer: >60 mL/min (ref >60)
GLUCOSE: 152 mg/dL — AB (ref 65–99)
POTASSIUM: 3.2 mmol/L — AB (ref 3.5–5.1)
SODIUM: 136 mmol/L (ref 135–145)
Total Bilirubin: 0.4 mg/dL (ref 0.3–1.2)
Total Protein: 7.2 g/dL (ref 6.5–8.1)

## 2015-06-19 LAB — MAGNESIUM: Magnesium: 1.3 mg/dL — ABNORMAL LOW (ref 1.7–2.4)

## 2015-06-19 MED ORDER — SODIUM CHLORIDE 0.9 % IV SOLN: 10.0000 mg | Freq: Once | INTRAVENOUS | Status: DC

## 2015-06-19 MED ORDER — SODIUM CHLORIDE 0.9 % IV SOLN
Freq: Once | INTRAVENOUS | Status: AC
  Administered 2015-06-19: 09:00:00 via INTRAVENOUS
  Filled 2015-06-19: qty 1000

## 2015-06-19 MED ORDER — FAMOTIDINE IN NACL 20-0.9 MG/50ML-% IV SOLN
20.0000 mg | Freq: Once | INTRAVENOUS | Status: AC
  Administered 2015-06-19: 20 mg via INTRAVENOUS
  Filled 2015-06-19: qty 50

## 2015-06-19 MED ORDER — DIPHENHYDRAMINE HCL 50 MG/ML IJ SOLN
50.0000 mg | Freq: Once | INTRAMUSCULAR | Status: AC
  Administered 2015-06-19: 50 mg via INTRAVENOUS
  Filled 2015-06-19: qty 1

## 2015-06-19 MED ORDER — HEPARIN SOD (PORK) LOCK FLUSH 100 UNIT/ML IV SOLN
500.0000 [IU] | Freq: Once | INTRAVENOUS | Status: AC
  Administered 2015-06-19: 500 [IU] via INTRAVENOUS
  Filled 2015-06-19: qty 5

## 2015-06-19 MED ORDER — SODIUM CHLORIDE 0.9% FLUSH
10.0000 mL | Freq: Once | INTRAVENOUS | Status: AC
Start: 2015-06-19 — End: 2015-06-19
  Administered 2015-06-19: 10 mL via INTRAVENOUS
  Filled 2015-06-19: qty 10

## 2015-06-19 MED ORDER — SODIUM CHLORIDE 0.9 % IV SOLN
80.0000 mg/m2 | Freq: Once | INTRAVENOUS | Status: AC
  Administered 2015-06-19: 168 mg via INTRAVENOUS
  Filled 2015-06-19: qty 28

## 2015-06-19 MED ORDER — SODIUM CHLORIDE 0.9% FLUSH
10.0000 mL | Freq: Once | INTRAVENOUS | Status: AC
  Administered 2015-06-19: 10 mL via INTRAVENOUS
  Filled 2015-06-19: qty 10

## 2015-06-19 MED ORDER — SODIUM CHLORIDE 0.9 % IV SOLN: Freq: Once | INTRAVENOUS | Status: DC

## 2015-06-19 MED ORDER — SODIUM CHLORIDE 0.9 % IV SOLN
Freq: Once | INTRAVENOUS | Status: AC
  Administered 2015-06-19: 11:00:00 via INTRAVENOUS
  Filled 2015-06-19: qty 100

## 2015-06-19 MED ORDER — SODIUM CHLORIDE 0.9 % IV SOLN
Freq: Once | INTRAVENOUS | Status: AC
  Administered 2015-06-19: 10:00:00 via INTRAVENOUS
  Filled 2015-06-19: qty 4

## 2015-06-21 ENCOUNTER — Encounter: Payer: Self-pay | Admitting: General Surgery

## 2015-06-26 ENCOUNTER — Ambulatory Visit: Payer: Medicare Other

## 2015-06-26 ENCOUNTER — Inpatient Hospital Stay: Payer: Medicare Other

## 2015-06-26 VITALS — BP 110/68 | HR 81 | Temp 96.8°F | Resp 20

## 2015-06-26 DIAGNOSIS — C50412 Malignant neoplasm of upper-outer quadrant of left female breast: Secondary | ICD-10-CM

## 2015-06-26 DIAGNOSIS — C801 Malignant (primary) neoplasm, unspecified: Secondary | ICD-10-CM

## 2015-06-26 DIAGNOSIS — R110 Nausea: Secondary | ICD-10-CM | POA: Diagnosis not present

## 2015-06-26 DIAGNOSIS — R040 Epistaxis: Secondary | ICD-10-CM | POA: Diagnosis not present

## 2015-06-26 DIAGNOSIS — Z5111 Encounter for antineoplastic chemotherapy: Secondary | ICD-10-CM | POA: Diagnosis not present

## 2015-06-26 DIAGNOSIS — Z1211 Encounter for screening for malignant neoplasm of colon: Secondary | ICD-10-CM

## 2015-06-26 DIAGNOSIS — E785 Hyperlipidemia, unspecified: Secondary | ICD-10-CM | POA: Diagnosis not present

## 2015-06-26 DIAGNOSIS — Z171 Estrogen receptor negative status [ER-]: Secondary | ICD-10-CM | POA: Diagnosis not present

## 2015-06-26 LAB — CBC WITH DIFFERENTIAL/PLATELET
BASOS ABS: 0 10*3/uL (ref 0–0.1)
Basophils Relative: 1 %
EOS ABS: 0.2 10*3/uL (ref 0–0.7)
EOS PCT: 5 %
HEMATOCRIT: 26.8 % — AB (ref 35.0–47.0)
Hemoglobin: 9.1 g/dL — ABNORMAL LOW (ref 12.0–16.0)
LYMPHS ABS: 0.4 10*3/uL — AB (ref 1.0–3.6)
Lymphocytes Relative: 13 %
MCH: 29.7 pg (ref 26.0–34.0)
MCHC: 34.1 g/dL (ref 32.0–36.0)
MCV: 87 fL (ref 80.0–100.0)
Monocytes Absolute: 0.2 10*3/uL (ref 0.2–0.9)
Monocytes Relative: 7 %
Neutro Abs: 2.4 10*3/uL (ref 1.4–6.5)
Neutrophils Relative %: 74 %
PLATELETS: 184 10*3/uL (ref 150–440)
RBC: 3.08 MIL/uL — AB (ref 3.80–5.20)
RDW: 21.1 % — ABNORMAL HIGH (ref 11.5–14.5)
WBC: 3.3 10*3/uL — AB (ref 3.6–11.0)

## 2015-06-26 LAB — MAGNESIUM: MAGNESIUM: 1.1 mg/dL — AB (ref 1.7–2.4)

## 2015-06-26 MED ORDER — HEPARIN SOD (PORK) LOCK FLUSH 100 UNIT/ML IV SOLN
500.0000 [IU] | Freq: Once | INTRAVENOUS | Status: AC | PRN
  Administered 2015-06-26: 500 [IU]
  Filled 2015-06-26: qty 5

## 2015-06-26 MED ORDER — SODIUM CHLORIDE 0.9% FLUSH
10.0000 mL | INTRAVENOUS | Status: DC | PRN
  Administered 2015-06-26: 10 mL via INTRAVENOUS
  Filled 2015-06-26: qty 10

## 2015-06-26 MED ORDER — SODIUM CHLORIDE 0.9 % IV SOLN
10.0000 mg | Freq: Once | INTRAVENOUS | Status: DC
  Filled 2015-06-26: qty 1

## 2015-06-26 MED ORDER — FAMOTIDINE IN NACL 20-0.9 MG/50ML-% IV SOLN
20.0000 mg | Freq: Once | INTRAVENOUS | Status: AC
Start: 2015-06-26 — End: 2015-06-26
  Administered 2015-06-26: 20 mg via INTRAVENOUS
  Filled 2015-06-26: qty 50

## 2015-06-26 MED ORDER — SODIUM CHLORIDE 0.9 % IV SOLN
Freq: Once | INTRAVENOUS | Status: AC
  Administered 2015-06-26: 09:00:00 via INTRAVENOUS
  Filled 2015-06-26: qty 1000

## 2015-06-26 MED ORDER — SODIUM CHLORIDE 0.9 % IV SOLN
Freq: Once | INTRAVENOUS | Status: AC
  Administered 2015-06-26: 11:00:00 via INTRAVENOUS
  Filled 2015-06-26: qty 4

## 2015-06-26 MED ORDER — DEXTROSE 5 % IV SOLN
80.0000 mg/m2 | Freq: Once | INTRAVENOUS | Status: AC
  Administered 2015-06-26: 168 mg via INTRAVENOUS
  Filled 2015-06-26: qty 28

## 2015-06-26 MED ORDER — DIPHENHYDRAMINE HCL 50 MG/ML IJ SOLN
50.0000 mg | Freq: Once | INTRAMUSCULAR | Status: AC
  Administered 2015-06-26: 50 mg via INTRAVENOUS
  Filled 2015-06-26: qty 1

## 2015-06-26 MED ORDER — SODIUM CHLORIDE 0.9 % IV SOLN: Freq: Once | INTRAVENOUS | Status: DC

## 2015-06-26 MED ORDER — MAGNESIUM SULFATE 2 GM/50ML IV SOLN
2.0000 g | Freq: Once | INTRAVENOUS | Status: AC
  Administered 2015-06-26: 2 g via INTRAVENOUS
  Filled 2015-06-26: qty 50

## 2015-06-27 ENCOUNTER — Encounter: Payer: Self-pay | Admitting: General Surgery

## 2015-06-27 ENCOUNTER — Other Ambulatory Visit: Payer: Self-pay | Admitting: Family Medicine

## 2015-07-02 ENCOUNTER — Other Ambulatory Visit: Payer: Self-pay | Admitting: *Deleted

## 2015-07-02 ENCOUNTER — Encounter: Payer: Self-pay | Admitting: General Surgery

## 2015-07-02 DIAGNOSIS — C50412 Malignant neoplasm of upper-outer quadrant of left female breast: Secondary | ICD-10-CM

## 2015-07-03 ENCOUNTER — Encounter: Payer: Self-pay | Admitting: Oncology

## 2015-07-03 ENCOUNTER — Inpatient Hospital Stay: Payer: Medicare Other

## 2015-07-03 ENCOUNTER — Ambulatory Visit: Payer: Medicare Other

## 2015-07-03 ENCOUNTER — Inpatient Hospital Stay (HOSPITAL_BASED_OUTPATIENT_CLINIC_OR_DEPARTMENT_OTHER): Payer: Medicare Other | Admitting: Oncology

## 2015-07-03 VITALS — BP 130/73 | HR 97 | Temp 97.1°F | Resp 18 | Wt 212.7 lb

## 2015-07-03 DIAGNOSIS — E785 Hyperlipidemia, unspecified: Secondary | ICD-10-CM | POA: Diagnosis not present

## 2015-07-03 DIAGNOSIS — R040 Epistaxis: Secondary | ICD-10-CM | POA: Diagnosis not present

## 2015-07-03 DIAGNOSIS — C50412 Malignant neoplasm of upper-outer quadrant of left female breast: Secondary | ICD-10-CM

## 2015-07-03 DIAGNOSIS — Z171 Estrogen receptor negative status [ER-]: Secondary | ICD-10-CM | POA: Diagnosis not present

## 2015-07-03 DIAGNOSIS — E119 Type 2 diabetes mellitus without complications: Secondary | ICD-10-CM

## 2015-07-03 DIAGNOSIS — Z5111 Encounter for antineoplastic chemotherapy: Secondary | ICD-10-CM | POA: Diagnosis not present

## 2015-07-03 DIAGNOSIS — Z85818 Personal history of malignant neoplasm of other sites of lip, oral cavity, and pharynx: Secondary | ICD-10-CM

## 2015-07-03 DIAGNOSIS — R110 Nausea: Secondary | ICD-10-CM

## 2015-07-03 DIAGNOSIS — Z923 Personal history of irradiation: Secondary | ICD-10-CM

## 2015-07-03 DIAGNOSIS — Z79899 Other long term (current) drug therapy: Secondary | ICD-10-CM

## 2015-07-03 DIAGNOSIS — Z9221 Personal history of antineoplastic chemotherapy: Secondary | ICD-10-CM

## 2015-07-03 DIAGNOSIS — Z87891 Personal history of nicotine dependence: Secondary | ICD-10-CM

## 2015-07-03 LAB — CBC WITH DIFFERENTIAL/PLATELET
Basophils Absolute: 0 10*3/uL (ref 0–0.1)
Basophils Relative: 1 %
EOS ABS: 0.1 10*3/uL (ref 0–0.7)
EOS PCT: 3 %
HCT: 27.1 % — ABNORMAL LOW (ref 35.0–47.0)
Hemoglobin: 9.4 g/dL — ABNORMAL LOW (ref 12.0–16.0)
LYMPHS ABS: 0.5 10*3/uL — AB (ref 1.0–3.6)
Lymphocytes Relative: 16 %
MCH: 30.5 pg (ref 26.0–34.0)
MCHC: 34.7 g/dL (ref 32.0–36.0)
MCV: 87.8 fL (ref 80.0–100.0)
Monocytes Absolute: 0.2 10*3/uL (ref 0.2–0.9)
Monocytes Relative: 5 %
Neutro Abs: 2.3 10*3/uL (ref 1.4–6.5)
Neutrophils Relative %: 75 %
PLATELETS: 190 10*3/uL (ref 150–440)
RBC: 3.08 MIL/uL — AB (ref 3.80–5.20)
RDW: 20.2 % — AB (ref 11.5–14.5)
WBC: 3 10*3/uL — AB (ref 3.6–11.0)

## 2015-07-03 LAB — COMPREHENSIVE METABOLIC PANEL
ALT: 23 U/L (ref 14–54)
AST: 33 U/L (ref 15–41)
Albumin: 3.7 g/dL (ref 3.5–5.0)
Alkaline Phosphatase: 66 U/L (ref 38–126)
Anion gap: 8 (ref 5–15)
BUN: 18 mg/dL (ref 6–20)
CHLORIDE: 105 mmol/L (ref 101–111)
CO2: 24 mmol/L (ref 22–32)
CREATININE: 0.71 mg/dL (ref 0.44–1.00)
Calcium: 9 mg/dL (ref 8.9–10.3)
GFR calc Af Amer: >60 mL/min (ref >60)
GFR calc non Af Amer: >60 mL/min (ref >60)
GLUCOSE: 130 mg/dL — AB (ref 65–99)
POTASSIUM: 3.5 mmol/L (ref 3.5–5.1)
SODIUM: 137 mmol/L (ref 135–145)
Total Bilirubin: 0.3 mg/dL (ref 0.3–1.2)
Total Protein: 7 g/dL (ref 6.5–8.1)

## 2015-07-03 LAB — MAGNESIUM: Magnesium: 1.3 mg/dL — ABNORMAL LOW (ref 1.7–2.4)

## 2015-07-03 MED ORDER — DIPHENHYDRAMINE HCL 50 MG/ML IJ SOLN
50.0000 mg | Freq: Once | INTRAMUSCULAR | Status: AC
  Administered 2015-07-03: 50 mg via INTRAVENOUS
  Filled 2015-07-03: qty 1

## 2015-07-03 MED ORDER — SODIUM CHLORIDE 0.9 % IV SOLN
Freq: Once | INTRAVENOUS | Status: AC
  Administered 2015-07-03: 10:00:00 via INTRAVENOUS
  Filled 2015-07-03: qty 1000

## 2015-07-03 MED ORDER — SODIUM CHLORIDE 0.9 % IV SOLN
10.0000 mg | Freq: Once | INTRAVENOUS | Status: AC
  Administered 2015-07-03: 10 mg via INTRAVENOUS
  Filled 2015-07-03: qty 1

## 2015-07-03 MED ORDER — CARBOPLATIN CHEMO INJECTION 600 MG/60ML
537.5000 mg | Freq: Once | INTRAVENOUS | Status: AC
  Administered 2015-07-03: 540 mg via INTRAVENOUS
  Filled 2015-07-03: qty 54

## 2015-07-03 MED ORDER — PACLITAXEL CHEMO INJECTION 300 MG/50ML
80.0000 mg/m2 | Freq: Once | INTRAVENOUS | Status: AC
  Administered 2015-07-03: 168 mg via INTRAVENOUS
  Filled 2015-07-03: qty 28

## 2015-07-03 MED ORDER — FAMOTIDINE IN NACL 20-0.9 MG/50ML-% IV SOLN
20.0000 mg | Freq: Once | INTRAVENOUS | Status: AC
  Administered 2015-07-03: 20 mg via INTRAVENOUS
  Filled 2015-07-03: qty 50

## 2015-07-03 MED ORDER — SODIUM CHLORIDE 0.9% FLUSH
10.0000 mL | Freq: Once | INTRAVENOUS | Status: AC
  Administered 2015-07-03: 10 mL via INTRAVENOUS
  Filled 2015-07-03: qty 10

## 2015-07-03 MED ORDER — MAGNESIUM SULFATE 2 GM/50ML IV SOLN
2.0000 g | Freq: Once | INTRAVENOUS | Status: AC
  Administered 2015-07-03: 2 g via INTRAVENOUS
  Filled 2015-07-03: qty 50

## 2015-07-03 MED ORDER — HEPARIN SOD (PORK) LOCK FLUSH 100 UNIT/ML IV SOLN
500.0000 [IU] | Freq: Once | INTRAVENOUS | Status: AC
  Administered 2015-07-03: 500 [IU] via INTRAVENOUS
  Filled 2015-07-03: qty 5

## 2015-07-03 MED ORDER — PALONOSETRON HCL INJECTION 0.25 MG/5ML
0.2500 mg | Freq: Once | INTRAVENOUS | Status: AC
  Administered 2015-07-03: 0.25 mg via INTRAVENOUS
  Filled 2015-07-03: qty 5

## 2015-07-03 NOTE — Progress Notes (Signed)
Patient states she has has nose bleeds almost every day.  Sometimes it is just when she blows her nose she gets small clots in the tissue.  Also has some nausea but not every day.

## 2015-07-05 DIAGNOSIS — F411 Generalized anxiety disorder: Secondary | ICD-10-CM | POA: Diagnosis not present

## 2015-07-08 ENCOUNTER — Encounter: Payer: Self-pay | Admitting: Oncology

## 2015-07-08 NOTE — Progress Notes (Signed)
[place] @ [facility] Telephone:(336) [phone]  Fax:(336) [place] OB: December 05, 1945  MR#: [id number]  [id number]  Patient Care Team: [name], MD as PCP - General [name], MD (General Surgery) [name], MD (Oncology)  CHIEF COMPLAINT:  Chief Complaint  Patient presents with  . 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  5 CM TUMOR MASS BIOPSIES POSITIVE FOR INVASIVE MAMMARY CARCINOMA TRIPLE NEGATIVE DISEASE(jANUARY, 2017)stage IIIa   VISIT DIAGNOSIS:     ICD-9-CM ICD-10-CM   1. 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 5 CM TUMOR MASS BIOPSIES POSITIVE FOR INVASIVE MAMMARY CARCINOMA TRIPLE NEGATIVE DISEASE(jANUARY, 2017)stage IIIa. 3. MUGA scan of the heart shows ejection fraction to be 61% (January, 2017) 4  4. Patient will start chemotherapy with Cytoxan and Adriamycin from March 18, 2015    5.  Patient   finished Cytoxan and Adriamycin for 4 cycles on fifth April, 2017 6.  Starting carboplatinum and Taxol 3 from June 11, 2015. 7.Taxol was started on June 11, 2015  INTERVAL HISTORY: 70-YEAR-OLD LADY WHO HAD BEEN PREVIOUSLY SEEN BY ME FOR TONSILLAR CANCER STATUS POST RESECTION AND RADIATION AND CHEMOTHERAPY. According to patient she was getting mammograms done on a regular basis however looking at old records  do not have any mammogram available for review until January of 2017 Patient is here for ongoing evaluation and treatment consideration.  Overall feeling better.  No nausea no vomiting or diarrhea.  Appetite has been improving.  No tingling.  No numbness.  No chills.  No fever.  Pain is getting better. Patient states she has has nose bleeds almost every day. Sometimes it is just when she blows her nose she gets small clots in the tissue. Also has some nausea but not every day. Patient started Taxol in April of 2017 year for the fifth cycle of treatment. Treatment very well.  No tingling numbness Complains of nosebleed  REVIEW OF SYSTEMS:   GENERAL:  Feels good.  Active.  No fevers, sweats or weight loss. PERFORMANCE STATUS (ECOG):  0-1 HEENT:  No visual changes, runny nose, sore throat, mouth sores or tenderness. had a recurrent sinus infection and and a previous history of tonsillar cancer Lungs: No shortness of breath or cough.  No hemoptysis. Cardiac:  No chest pain, palpitations, orthopnea, or PND. GI:  No nausea, vomiting, diarrhea, constipation, melena or hematochezia. GU:  No urgency, frequency, dysuria, or hematuria. Musculoskeletal:  No back pain.  No joint pain.  No muscle tenderness. Extremities:  No pain or swelling. Skin:  No rashes or skin changes. Neuro:  No headache, numbness or weakness, balance or coordination issues. Endocrine:  No diabetes, thyroid issues, hot flashes or night sweats. Psych:  No mood changes, depression or anxiety. Pain:  No focal pain. Review of systems:  All other systems reviewed and found to be negative.  As per HPI. Otherwise, a complete review of systems is negatve.  PAST MEDICAL  HISTORY: Past Medical History  Diagnosis Date  . Hyperlipidemia   . Diverticulosis   . Shingles     Hx of  . Gastroparesis     related to previous radiation tx  . Anxiety   . Anemia     iron deficiency and B12 deficiency  . Cervical dysplasia     conization    . Diabetes mellitus without complication ([place])   . 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[place]) 2006  . Hypertension   . GERD (gastroesophageal reflux disease)   . Fever 04/11/2015    PAST SURGICAL HISTORY: Past Surgical History  Procedure Laterality Date  . Breast surgery      breast biopsy benign  . Appendectomy    . Tonsillectomy      cancer treated with chemo and radiation  . Cholecystectomy    . Mole removal    . Esophagogastroduodenoscopy  07/2009    normal with few gastric polyps  . Breast cyst aspiration    . Breast biopsy Left 02/25/2015    path pending  . Portacath placement    . Port-a-cath removal    . Radical neck dissection    . Portacath placement Right 03/12/2015    Procedure: INSERTION PORT-A-CATH;  Surgeon: [name], MD;  Location: [facility] ORS;  Service: General;  Laterality: Right;    FAMILY HISTORY Family History  Problem Relation Age of Onset  . Osteoporosis Mother   . Hyperlipidemia Mother   . Depression Mother   . Cancer Mother     skin cancer ? basal cell and lung ca heavy smoker  . Alcohol abuse Father   . Cancer Father     skin CA ? basal cell  . Heart disease Father     CHF  . Hyperlipidemia Brother   . Hypertension Brother    Significant History/PMH:   Hypercholesterolemia:    tonsil cancer:    Sinus Surgery:    breast biopsy-negative: 2000   cone biopsy for cervical dysplasia 1980's:    Cholecystectomy: 2000   Appendectomy: 1970  Preventive Screening:  Has patient had any of the following test?as per patient as he was getting regular mammogram done but we cannot find any record in   [place] breast cancer Mammography (1)  Smoking History: Smoking History quit 1980's (early)(1)  PFSH: Additional Past Medical and Surgical History: Past Medical History  Hypercholesterolemia.    Past Surgical History:  Appendectomy in 1970.  Gallbladder removal in 2000.  Cone biopsy for cervical dysplasia in the early 80's.  Breast biopsy in early 2000  which was negative.    Family History:  There is a family history of heart disease. She denies a family history of any cancer, diabetes, or any bleeding disorders.    Social History:  She is married. She does not have any children. She works at a [employer]. She has a history of smoking about 1 pack every 2 days from 1968-1990. She denies any alcohol use. She denies any recreational drug use. She denies any radiation exposure         ADVANCED DIRECTIVES:    HEALTH MAINTENANCE: Social History  Substance Use Topics  . Smoking status: Former Smoker -- 0.50 packs/day for 6 years    Types: Cigarettes    Quit date: 03/10/1984  . Smokeless tobacco: Never Used  . Alcohol Use: No      Allergies  Allergen Reactions  . Amoxicillin     REACTION: rash  . Fluconazole     REACTION:  hives  . Gabapentin     depression    Current Outpatient Prescriptions  Medication Sig Dispense Refill  . ALPRAZolam (XANAX) 0.5 MG tablet Take 1 tablet (0.5 mg total) by mouth at bedtime as needed for sleep. 30 tablet 3  . atorvastatin (LIPITOR) 10 MG tablet Take 1 tablet (10 mg total) by mouth daily. (Patient taking differently: Take 10 mg by mouth daily at 6 PM. ) 30 tablet 5  . b complex vitamins tablet Take 1 tablet by mouth daily.      . cyanocobalamin (,VITAMIN B-12,) 1000 MCG/ML injection Inject 1,000 mcg into the muscle every 30 (thirty) days.      . ferrous sulfate 325 (65 FE) MG tablet Take 325 mg by mouth daily with breakfast.      . fluticasone (FLONASE) 50 MCG/ACT nasal spray instill 2 sprays into each nostril once daily 16 g 5  . glipiZIDE (GLUCOTROL) 10 MG tablet Take 20 mg by mouth daily before breakfast. Take 2 tabs daily on the day of chemotherapy and the day after chemo    . JANUMET XR 50-1000 MG TB24 Take 2 tablets by mouth daily.  0  . levofloxacin (LEVAQUIN) 500 MG tablet Take 1 tablet (500 mg total) by mouth daily. 7 tablet 0  . lidocaine-prilocaine (EMLA) cream  APPLY TO AFFECTED AREA ONCE A DAY  0  . lisinopril (PRINIVIL,ZESTRIL) 5 MG tablet Take 1 tablet (5 mg total) by mouth daily. (Patient taking differently: Take 5 mg by mouth every morning. ) 30 tablet 5  . mirtazapine (REMERON) 15 MG tablet Take 45 mg by mouth at bedtime.     [name] omeprazole (PRILOSEC) 20 MG capsule Take 1 capsule (20 mg total) by mouth daily. (Patient taking differently: Take 20 mg by mouth at bedtime. ) 30 capsule 5  . ondansetron (ZOFRAN) 8 MG tablet take 1 tablet by mouth twice a day (START ON THE 3RD DAY AFTER CHEMOTHERAPY) 30 tablet 1  . rOPINIRole (REQUIP) 1 MG tablet 1 pill in the am as needed by mouth and 3 pills at bedtime by mouth (Patient taking differently: Take 1 mg by mouth. 1 pill in the am as needed by mouth and 3 pills at bedtime by mouth) 120 tablet 5  . sertraline (ZOLOFT) 100 MG tablet Take 200 mg by mouth daily.  0   No current facility-administered medications for this visit.    OBJECTIVE: PHYSICAL EXAM: Gen. Status: Patient is alert oriented not in any acute distress HEENT: Patient previously had tonsillar cancer status post chemoradiation therapy and resection.  There is no evidence of recurrent disease Lymphatic system: Supraclavicular, cervical,  inguinal lymph nodes are not palpable, There might be palpable left axillary lymph node. Examination of the chest was unremarkable. There were no bony deformities, no asymmetry, and no other abnormalities.  Cardiac exam revealed the PMI to be normally situated and sized. The rhythm was regular and no extrasystoles were noted during several minutes of auscultation. The first and second heart sounds were normal and physiologic splitting of the second heart sound was noted. There were no murmurs, rubs, clicks, or gallops. Neurologically, the patient was awake, alert, and oriented to person, place and time. There were no obvious focal neurologic abnormalities. Examination of breasts: Right breast free of masses.  Left  breast palpable mass 4.5-5 cm.  There is redness on the skin.  That might be a palpable left axillary lymph node  Filed Vitals:   07/03/15 0901  BP: 130/73  Pulse:  97  Temp: 97.1 F (36.2 C)  Resp: 18     Body mass index is 34.35 kg/(m^2).    ECOG FS:1 - Symptomatic but completely ambulatory  LAB RESULTS:   Lab data has been reviewed  STUDIES: No results found.  ASSESSMENT: carcinoma of breast.  Left upper and outer quadrant in size of tumor. Palpable axillary lymph node initial biopsy of lymph node was negative however there is strong radiological suspicion of the lymph node involvement. There is redness on the skin which according to patient started after biopsy. If it persists or gets worse antibiotic therapy or biopsy of the skin to rule out inflammatory carcinoma may be needed. Based on now mammogram ultrasound patient has been staged as IIIa disease Patient has had triple negative disease  2. Previous history of tonsillar cancer diagnosed in 2006 without any evidence of recurrent disease status post cis-platinum and 5-FU chemotherapy    PLAN:   All lab data, pathology as well as ultrasound and mammogram has been reviewed Continue Taxol for 2 more cycles and reevaluate patient Epistaxis mucositis   if it continues then ENT evaluation planned  Patient expressed understanding and was in agreement with this plan. She also understands that She can call clinic at any time with any questions, concerns, or complaints.    No matching staging information was found for the patient.  [name], MD   07/08/2015 5:20 PM

## 2015-07-10 ENCOUNTER — Inpatient Hospital Stay: Payer: Medicare Other

## 2015-07-10 VITALS — BP 121/67 | HR 90 | Temp 96.2°F | Resp 18

## 2015-07-10 DIAGNOSIS — C50412 Malignant neoplasm of upper-outer quadrant of left female breast: Secondary | ICD-10-CM

## 2015-07-10 DIAGNOSIS — Z5111 Encounter for antineoplastic chemotherapy: Secondary | ICD-10-CM | POA: Diagnosis not present

## 2015-07-10 DIAGNOSIS — E785 Hyperlipidemia, unspecified: Secondary | ICD-10-CM | POA: Diagnosis not present

## 2015-07-10 DIAGNOSIS — R040 Epistaxis: Secondary | ICD-10-CM | POA: Diagnosis not present

## 2015-07-10 DIAGNOSIS — Z171 Estrogen receptor negative status [ER-]: Secondary | ICD-10-CM | POA: Diagnosis not present

## 2015-07-10 DIAGNOSIS — R110 Nausea: Secondary | ICD-10-CM | POA: Diagnosis not present

## 2015-07-10 LAB — CBC WITH DIFFERENTIAL/PLATELET
BASOS ABS: 0 10*3/uL (ref 0–0.1)
BASOS PCT: 1 %
Eosinophils Absolute: 0.1 10*3/uL (ref 0–0.7)
Eosinophils Relative: 4 %
HEMATOCRIT: 26.4 % — AB (ref 35.0–47.0)
Hemoglobin: 9.1 g/dL — ABNORMAL LOW (ref 12.0–16.0)
Lymphocytes Relative: 15 %
Lymphs Abs: 0.3 10*3/uL — ABNORMAL LOW (ref 1.0–3.6)
MCH: 30.8 pg (ref 26.0–34.0)
MCHC: 34.6 g/dL (ref 32.0–36.0)
MCV: 88.8 fL (ref 80.0–100.0)
MONO ABS: 0.1 10*3/uL — AB (ref 0.2–0.9)
Monocytes Relative: 5 %
NEUTROS ABS: 1.7 10*3/uL (ref 1.4–6.5)
Neutrophils Relative %: 75 %
PLATELETS: 226 10*3/uL (ref 150–440)
RBC: 2.97 MIL/uL — ABNORMAL LOW (ref 3.80–5.20)
RDW: 18.7 % — AB (ref 11.5–14.5)
WBC: 2.2 10*3/uL — ABNORMAL LOW (ref 3.6–11.0)

## 2015-07-10 LAB — COMPREHENSIVE METABOLIC PANEL
ALBUMIN: 3.6 g/dL (ref 3.5–5.0)
ALT: 26 U/L (ref 14–54)
AST: 37 U/L (ref 15–41)
Alkaline Phosphatase: 66 U/L (ref 38–126)
Anion gap: 7 (ref 5–15)
BILIRUBIN TOTAL: 0.3 mg/dL (ref 0.3–1.2)
BUN: 20 mg/dL (ref 6–20)
CHLORIDE: 104 mmol/L (ref 101–111)
CO2: 25 mmol/L (ref 22–32)
Calcium: 8.7 mg/dL — ABNORMAL LOW (ref 8.9–10.3)
Creatinine, Ser: 0.66 mg/dL (ref 0.44–1.00)
GFR calc Af Amer: >60 mL/min (ref >60)
GFR calc non Af Amer: >60 mL/min (ref >60)
GLUCOSE: 166 mg/dL — AB (ref 65–99)
POTASSIUM: 3.6 mmol/L (ref 3.5–5.1)
Sodium: 136 mmol/L (ref 135–145)
Total Protein: 6.7 g/dL (ref 6.5–8.1)

## 2015-07-10 LAB — MAGNESIUM: Magnesium: 1.3 mg/dL — ABNORMAL LOW (ref 1.7–2.4)

## 2015-07-10 MED ORDER — PACLITAXEL CHEMO INJECTION 300 MG/50ML
80.0000 mg/m2 | Freq: Once | INTRAVENOUS | Status: AC
  Administered 2015-07-10: 168 mg via INTRAVENOUS
  Filled 2015-07-10: qty 28

## 2015-07-10 MED ORDER — FAMOTIDINE IN NACL 20-0.9 MG/50ML-% IV SOLN
20.0000 mg | Freq: Once | INTRAVENOUS | Status: AC
  Administered 2015-07-10: 20 mg via INTRAVENOUS
  Filled 2015-07-10: qty 50

## 2015-07-10 MED ORDER — DIPHENHYDRAMINE HCL 50 MG/ML IJ SOLN
50.0000 mg | Freq: Once | INTRAMUSCULAR | Status: AC
  Administered 2015-07-10: 50 mg via INTRAVENOUS
  Filled 2015-07-10: qty 1

## 2015-07-10 MED ORDER — SODIUM CHLORIDE 0.9 % IV SOLN
Freq: Once | INTRAVENOUS | Status: AC
  Administered 2015-07-10: 10:00:00 via INTRAVENOUS
  Filled 2015-07-10: qty 1000

## 2015-07-10 MED ORDER — HEPARIN SOD (PORK) LOCK FLUSH 100 UNIT/ML IV SOLN
500.0000 [IU] | Freq: Once | INTRAVENOUS | Status: AC | PRN
Start: 2015-07-10 — End: 2015-07-10
  Administered 2015-07-10: 500 [IU]
  Filled 2015-07-10 (×2): qty 5

## 2015-07-10 MED ORDER — SODIUM CHLORIDE 0.9 % IV SOLN
Freq: Once | INTRAVENOUS | Status: AC
  Administered 2015-07-10: 11:00:00 via INTRAVENOUS
  Filled 2015-07-10: qty 4

## 2015-07-10 MED ORDER — MAGNESIUM SULFATE 2 GM/50ML IV SOLN
2.0000 g | Freq: Once | INTRAVENOUS | Status: AC
  Administered 2015-07-10: 2 g via INTRAVENOUS
  Filled 2015-07-10: qty 50

## 2015-07-10 MED ORDER — SODIUM CHLORIDE 0.9 % IV SOLN: 10.0000 mg | Freq: Once | INTRAVENOUS | Status: DC

## 2015-07-10 MED ORDER — SODIUM CHLORIDE 0.9 % IV SOLN: Freq: Once | INTRAVENOUS | Status: DC

## 2015-07-10 MED ORDER — SODIUM CHLORIDE 0.9% FLUSH
10.0000 mL | INTRAVENOUS | Status: DC | PRN
  Administered 2015-07-10: 10 mL
  Filled 2015-07-10: qty 10

## 2015-07-17 ENCOUNTER — Inpatient Hospital Stay: Payer: Medicare Other

## 2015-07-17 DIAGNOSIS — C50412 Malignant neoplasm of upper-outer quadrant of left female breast: Secondary | ICD-10-CM

## 2015-07-17 DIAGNOSIS — E785 Hyperlipidemia, unspecified: Secondary | ICD-10-CM | POA: Diagnosis not present

## 2015-07-17 DIAGNOSIS — Z171 Estrogen receptor negative status [ER-]: Secondary | ICD-10-CM | POA: Diagnosis not present

## 2015-07-17 DIAGNOSIS — R110 Nausea: Secondary | ICD-10-CM | POA: Diagnosis not present

## 2015-07-17 DIAGNOSIS — R040 Epistaxis: Secondary | ICD-10-CM | POA: Diagnosis not present

## 2015-07-17 DIAGNOSIS — Z5111 Encounter for antineoplastic chemotherapy: Secondary | ICD-10-CM | POA: Diagnosis not present

## 2015-07-17 LAB — CBC WITH DIFFERENTIAL/PLATELET
BASOS ABS: 0 10*3/uL (ref 0–0.1)
BASOS PCT: 1 %
EOS PCT: 2 %
Eosinophils Absolute: 0 10*3/uL (ref 0–0.7)
HEMATOCRIT: 26.5 % — AB (ref 35.0–47.0)
Hemoglobin: 9.1 g/dL — ABNORMAL LOW (ref 12.0–16.0)
Lymphocytes Relative: 16 %
Lymphs Abs: 0.3 10*3/uL — ABNORMAL LOW (ref 1.0–3.6)
MCH: 31.4 pg (ref 26.0–34.0)
MCHC: 34.4 g/dL (ref 32.0–36.0)
MCV: 91.4 fL (ref 80.0–100.0)
MONO ABS: 0.1 10*3/uL — AB (ref 0.2–0.9)
MONOS PCT: 7 %
NEUTROS ABS: 1.6 10*3/uL (ref 1.4–6.5)
Neutrophils Relative %: 74 %
PLATELETS: 223 10*3/uL (ref 150–440)
RBC: 2.9 MIL/uL — ABNORMAL LOW (ref 3.80–5.20)
RDW: 17.6 % — AB (ref 11.5–14.5)
WBC: 2.1 10*3/uL — ABNORMAL LOW (ref 3.6–11.0)

## 2015-07-17 LAB — MAGNESIUM: Magnesium: 1.2 mg/dL — ABNORMAL LOW (ref 1.7–2.4)

## 2015-07-17 MED ORDER — SODIUM CHLORIDE 0.9% FLUSH
10.0000 mL | Freq: Once | INTRAVENOUS | Status: AC
  Administered 2015-07-17: 10 mL via INTRAVENOUS
  Filled 2015-07-17: qty 10

## 2015-07-17 MED ORDER — SODIUM CHLORIDE 0.9 % IV SOLN
Freq: Once | INTRAVENOUS | Status: AC
  Administered 2015-07-17: 12:00:00 via INTRAVENOUS
  Filled 2015-07-17: qty 4

## 2015-07-17 MED ORDER — SODIUM CHLORIDE 0.9 % IV SOLN
Freq: Once | INTRAVENOUS | Status: AC
  Administered 2015-07-17: 10:00:00 via INTRAVENOUS
  Filled 2015-07-17: qty 1000

## 2015-07-17 MED ORDER — MAGNESIUM SULFATE 2 GM/50ML IV SOLN
2.0000 g | Freq: Once | INTRAVENOUS | Status: AC
  Administered 2015-07-17: 2 g via INTRAVENOUS
  Filled 2015-07-17: qty 50

## 2015-07-17 MED ORDER — FAMOTIDINE IN NACL 20-0.9 MG/50ML-% IV SOLN
20.0000 mg | Freq: Once | INTRAVENOUS | Status: AC
  Administered 2015-07-17: 20 mg via INTRAVENOUS
  Filled 2015-07-17: qty 50

## 2015-07-17 MED ORDER — DEXTROSE 5 % IV SOLN
80.0000 mg/m2 | Freq: Once | INTRAVENOUS | Status: AC
  Administered 2015-07-17: 168 mg via INTRAVENOUS
  Filled 2015-07-17: qty 28

## 2015-07-17 MED ORDER — HEPARIN SOD (PORK) LOCK FLUSH 100 UNIT/ML IV SOLN
500.0000 [IU] | Freq: Once | INTRAVENOUS | Status: AC
  Administered 2015-07-17: 500 [IU] via INTRAVENOUS
  Filled 2015-07-17: qty 5

## 2015-07-17 MED ORDER — DEXAMETHASONE SODIUM PHOSPHATE 100 MG/10ML IJ SOLN: 10.0000 mg | Freq: Once | INTRAMUSCULAR | Status: DC

## 2015-07-17 MED ORDER — SODIUM CHLORIDE 0.9 % IV SOLN: Freq: Once | INTRAVENOUS | Status: DC

## 2015-07-17 MED ORDER — DIPHENHYDRAMINE HCL 50 MG/ML IJ SOLN
50.0000 mg | Freq: Once | INTRAMUSCULAR | Status: AC
  Administered 2015-07-17: 50 mg via INTRAVENOUS
  Filled 2015-07-17: qty 1

## 2015-07-22 ENCOUNTER — Other Ambulatory Visit: Payer: Self-pay | Admitting: *Deleted

## 2015-07-22 DIAGNOSIS — C50412 Malignant neoplasm of upper-outer quadrant of left female breast: Secondary | ICD-10-CM

## 2015-07-24 ENCOUNTER — Inpatient Hospital Stay (HOSPITAL_BASED_OUTPATIENT_CLINIC_OR_DEPARTMENT_OTHER): Payer: Medicare Other | Admitting: Oncology

## 2015-07-24 ENCOUNTER — Inpatient Hospital Stay: Payer: Medicare Other

## 2015-07-24 ENCOUNTER — Encounter: Payer: Self-pay | Admitting: Oncology

## 2015-07-24 ENCOUNTER — Inpatient Hospital Stay: Payer: Medicare Other | Attending: Oncology

## 2015-07-24 VITALS — BP 133/73 | HR 98 | Temp 96.8°F | Wt 212.4 lb

## 2015-07-24 VITALS — BP 111/68 | HR 88 | Resp 20

## 2015-07-24 DIAGNOSIS — E119 Type 2 diabetes mellitus without complications: Secondary | ICD-10-CM | POA: Diagnosis not present

## 2015-07-24 DIAGNOSIS — E785 Hyperlipidemia, unspecified: Secondary | ICD-10-CM

## 2015-07-24 DIAGNOSIS — Z87891 Personal history of nicotine dependence: Secondary | ICD-10-CM | POA: Insufficient documentation

## 2015-07-24 DIAGNOSIS — R590 Localized enlarged lymph nodes: Secondary | ICD-10-CM

## 2015-07-24 DIAGNOSIS — C50412 Malignant neoplasm of upper-outer quadrant of left female breast: Secondary | ICD-10-CM | POA: Diagnosis not present

## 2015-07-24 DIAGNOSIS — E876 Hypokalemia: Secondary | ICD-10-CM | POA: Diagnosis not present

## 2015-07-24 DIAGNOSIS — K5790 Diverticulosis of intestine, part unspecified, without perforation or abscess without bleeding: Secondary | ICD-10-CM | POA: Insufficient documentation

## 2015-07-24 DIAGNOSIS — Z79899 Other long term (current) drug therapy: Secondary | ICD-10-CM

## 2015-07-24 DIAGNOSIS — Z9221 Personal history of antineoplastic chemotherapy: Secondary | ICD-10-CM | POA: Insufficient documentation

## 2015-07-24 DIAGNOSIS — K219 Gastro-esophageal reflux disease without esophagitis: Secondary | ICD-10-CM | POA: Insufficient documentation

## 2015-07-24 DIAGNOSIS — I10 Essential (primary) hypertension: Secondary | ICD-10-CM

## 2015-07-24 DIAGNOSIS — Z923 Personal history of irradiation: Secondary | ICD-10-CM

## 2015-07-24 DIAGNOSIS — Z85818 Personal history of malignant neoplasm of other sites of lip, oral cavity, and pharynx: Secondary | ICD-10-CM | POA: Diagnosis not present

## 2015-07-24 DIAGNOSIS — Z5111 Encounter for antineoplastic chemotherapy: Secondary | ICD-10-CM | POA: Diagnosis not present

## 2015-07-24 DIAGNOSIS — R200 Anesthesia of skin: Secondary | ICD-10-CM

## 2015-07-24 DIAGNOSIS — Z171 Estrogen receptor negative status [ER-]: Secondary | ICD-10-CM

## 2015-07-24 LAB — CBC WITH DIFFERENTIAL/PLATELET
BASOS PCT: 1 %
Basophils Absolute: 0 10*3/uL (ref 0–0.1)
Eosinophils Absolute: 0 10*3/uL (ref 0–0.7)
Eosinophils Relative: 1 %
HEMATOCRIT: 25.7 % — AB (ref 35.0–47.0)
HEMOGLOBIN: 8.8 g/dL — AB (ref 12.0–16.0)
Lymphocytes Relative: 17 %
Lymphs Abs: 0.4 10*3/uL — ABNORMAL LOW (ref 1.0–3.6)
MCH: 31.8 pg (ref 26.0–34.0)
MCHC: 34.2 g/dL (ref 32.0–36.0)
MCV: 93 fL (ref 80.0–100.0)
Monocytes Absolute: 0.1 10*3/uL — ABNORMAL LOW (ref 0.2–0.9)
Monocytes Relative: 6 %
NEUTROS ABS: 1.7 10*3/uL (ref 1.4–6.5)
NEUTROS PCT: 75 %
Platelets: 136 10*3/uL — ABNORMAL LOW (ref 150–440)
RBC: 2.76 MIL/uL — ABNORMAL LOW (ref 3.80–5.20)
RDW: 17.7 % — ABNORMAL HIGH (ref 11.5–14.5)
WBC: 2.3 10*3/uL — AB (ref 3.6–11.0)

## 2015-07-24 LAB — COMPREHENSIVE METABOLIC PANEL
ALK PHOS: 61 U/L (ref 38–126)
ALT: 28 U/L (ref 14–54)
AST: 34 U/L (ref 15–41)
Albumin: 3.8 g/dL (ref 3.5–5.0)
Anion gap: 8 (ref 5–15)
BUN: 11 mg/dL (ref 6–20)
CALCIUM: 8.8 mg/dL — AB (ref 8.9–10.3)
CO2: 24 mmol/L (ref 22–32)
CREATININE: 0.72 mg/dL (ref 0.44–1.00)
Chloride: 105 mmol/L (ref 101–111)
GFR calc non Af Amer: >60 mL/min (ref >60)
GLUCOSE: 140 mg/dL — AB (ref 65–99)
Potassium: 3.3 mmol/L — ABNORMAL LOW (ref 3.5–5.1)
SODIUM: 137 mmol/L (ref 135–145)
Total Bilirubin: 0.5 mg/dL (ref 0.3–1.2)
Total Protein: 6.7 g/dL (ref 6.5–8.1)

## 2015-07-24 LAB — MAGNESIUM: Magnesium: 1.3 mg/dL — ABNORMAL LOW (ref 1.7–2.4)

## 2015-07-24 MED ORDER — SODIUM CHLORIDE 0.9 % IV SOLN
Freq: Once | INTRAVENOUS | Status: AC
  Administered 2015-07-24: 11:00:00 via INTRAVENOUS
  Filled 2015-07-24: qty 1000

## 2015-07-24 MED ORDER — DIPHENHYDRAMINE HCL 50 MG/ML IJ SOLN
50.0000 mg | Freq: Once | INTRAMUSCULAR | Status: AC
  Administered 2015-07-24: 50 mg via INTRAVENOUS
  Filled 2015-07-24: qty 1

## 2015-07-24 MED ORDER — HEPARIN SOD (PORK) LOCK FLUSH 100 UNIT/ML IV SOLN
500.0000 [IU] | Freq: Once | INTRAVENOUS | Status: DC
  Filled 2015-07-24: qty 5

## 2015-07-24 MED ORDER — PALONOSETRON HCL INJECTION 0.25 MG/5ML
0.2500 mg | Freq: Once | INTRAVENOUS | Status: AC
  Administered 2015-07-24: 0.25 mg via INTRAVENOUS
  Filled 2015-07-24: qty 5

## 2015-07-24 MED ORDER — HEPARIN SOD (PORK) LOCK FLUSH 100 UNIT/ML IV SOLN
500.0000 [IU] | Freq: Once | INTRAVENOUS | Status: AC | PRN
  Administered 2015-07-24: 500 [IU]

## 2015-07-24 MED ORDER — DEXTROSE 5 % IV SOLN
80.0000 mg/m2 | Freq: Once | INTRAVENOUS | Status: AC
  Administered 2015-07-24: 168 mg via INTRAVENOUS
  Filled 2015-07-24: qty 28

## 2015-07-24 MED ORDER — SODIUM CHLORIDE 0.9% FLUSH
10.0000 mL | Freq: Once | INTRAVENOUS | Status: AC
  Administered 2015-07-24: 10 mL via INTRAVENOUS
  Filled 2015-07-24: qty 10

## 2015-07-24 MED ORDER — MORPHINE SULFATE 4 MG/ML IJ SOLN: 1.0000 mg | Freq: Once | INTRAMUSCULAR | Status: DC

## 2015-07-24 MED ORDER — MAGNESIUM CHLORIDE 64 MG PO TBEC: 1.0000 | DELAYED_RELEASE_TABLET | Freq: Every day | ORAL | Status: DC

## 2015-07-24 MED ORDER — POTASSIUM CHLORIDE ER 10 MEQ PO TBCR: 10.0000 meq | EXTENDED_RELEASE_TABLET | Freq: Every day | ORAL | Status: DC

## 2015-07-24 MED ORDER — FAMOTIDINE IN NACL 20-0.9 MG/50ML-% IV SOLN
20.0000 mg | Freq: Once | INTRAVENOUS | Status: AC
  Administered 2015-07-24: 20 mg via INTRAVENOUS
  Filled 2015-07-24: qty 50

## 2015-07-24 MED ORDER — SODIUM CHLORIDE 0.9 % IV SOLN
537.5000 mg | Freq: Once | INTRAVENOUS | Status: AC
  Administered 2015-07-24: 540 mg via INTRAVENOUS
  Filled 2015-07-24: qty 54

## 2015-07-24 MED ORDER — SODIUM CHLORIDE 0.9 % IV SOLN
10.0000 mg | Freq: Once | INTRAVENOUS | Status: AC
  Administered 2015-07-24: 10 mg via INTRAVENOUS
  Filled 2015-07-24: qty 1

## 2015-07-24 MED ORDER — SODIUM CHLORIDE 0.9 % IV SOLN
Freq: Once | INTRAVENOUS | Status: AC
  Administered 2015-07-24: 10:00:00 via INTRAVENOUS
  Filled 2015-07-24: qty 100

## 2015-07-24 NOTE — Progress Notes (Signed)
[place] @ [facility] [phone]) [phone]  Fax:([phone]) [place] OB: 02-28-1945  MR#: [id number]  [id number]  Patient Care Team: [name], MD as PCP - General [name], MD (General Surgery) [name], MD (Oncology)  CHIEF COMPLAINT:  Chief Complaint  Patient presents with  . 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  5 CM TUMOR MASS BIOPSIES POSITIVE FOR INVASIVE MAMMARY CARCINOMA TRIPLE NEGATIVE DISEASE(jANUARY, 2017)stage IIIa   VISIT DIAGNOSIS:     ICD-9-CM ICD-10-CM   1. 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 5 CM TUMOR MASS BIOPSIES POSITIVE FOR INVASIVE MAMMARY CARCINOMA TRIPLE NEGATIVE DISEASE(jANUARY, 2017)stage IIIa. 3. MUGA scan of the heart shows ejection fraction to be 61% (January, 2017) 4  4. Patient will start chemotherapy with Cytoxan and Adriamycin from March 18, 2015    5.  Patient   finished Cytoxan and Adriamycin for 4 cycles on fifth April, 2017 6.  Starting carboplatinum and Taxol 3 from June 11, 2015. 7.Taxol was started on June 11, 2015  INTERVAL  HISTORY: 70-YEAR-OLD LADY WHO HAD BEEN PREVIOUSLY SEEN BY ME FOR TONSILLAR CANCER STATUS POST RESECTION AND RADIATION AND CHEMOTHERAPY. According to patient she was getting mammograms done on a regular basis however looking at old records do not have any mammogram available for review until January of 2017 Patient is here for ongoing evaluation and treatment consideration.  Overall feeling better.  Patient is here for last cycle of chemotherapy with carboplatinum on day 1 and Taxol day 1 and 8 and 15. Tolerating treatment very well.  Mild numbness.  No difficulty holding or handling BUTTONING  or unbuttoning short period alopecia.  No evidence of stomatitis.  REVIEW OF SYSTEMS:   GENERAL:  Feels good.  Active.  No fevers, sweats or weight loss. PERFORMANCE STATUS (ECOG):  0-1 HEENT:  No visual changes, runny nose, sore throat, mouth sores or tenderness. had a recurrent sinus infection and and a previous history of tonsillar cancer Lungs: No shortness of breath or cough.  No hemoptysis. Cardiac:  No chest pain, palpitations, orthopnea, or PND. GI:  No nausea, vomiting, diarrhea, constipation, melena or hematochezia. GU:  No urgency, frequency, dysuria, or hematuria. Musculoskeletal:  No back pain.  No joint pain.  No muscle tenderness. Extremities:  No pain or swelling. Skin:  No rashes or skin changes. Neuro:  No headache, numbness or weakness, balance or coordination issues. Endocrine:  No diabetes, thyroid issues, hot flashes or night sweats. Psych:  No mood changes, depression or anxiety. Pain:  No focal pain. Review of systems:  All other systems reviewed and found to be  negative.  As per HPI. Otherwise, a complete review of systems is negatve.  PAST MEDICAL HISTORY: Past Medical History  Diagnosis Date  . Hyperlipidemia   . Diverticulosis   . Shingles     Hx of  . Gastroparesis     related to previous radiation tx  . Anxiety   . Anemia     iron deficiency and B12 deficiency    . Cervical dysplasia     conization  . Diabetes mellitus without complication ([place])   . 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[place]) 2006  . Hypertension   . GERD (gastroesophageal reflux disease)   . Fever 04/11/2015    PAST SURGICAL HISTORY: Past Surgical History  Procedure Laterality Date  . Breast surgery      breast biopsy benign  . Appendectomy    . Tonsillectomy      cancer treated with chemo and radiation  . Cholecystectomy    . Mole removal    . Esophagogastroduodenoscopy  07/2009    normal with few gastric polyps  . Breast cyst aspiration    . Breast biopsy Left 02/25/2015    path pending  . Portacath placement    . Port-a-cath removal    . Radical neck dissection    . Portacath placement Right 03/12/2015    Procedure: INSERTION PORT-A-CATH;  Surgeon: [name], MD;  Location: [facility] ORS;  Service: General;  Laterality: Right;    FAMILY HISTORY Family History  Problem Relation Age of Onset  . Osteoporosis Mother   . Hyperlipidemia Mother   . Depression Mother   . Cancer Mother     skin cancer ? basal cell and lung ca heavy smoker  . Alcohol abuse Father   . Cancer Father     skin CA ? basal cell  . Heart disease Father     CHF  . Hyperlipidemia Brother   . Hypertension Brother    Significant History/PMH:   Hypercholesterolemia:    tonsil cancer:    Sinus Surgery:    breast biopsy-negative: 2000   cone biopsy for cervical dysplasia 1980's:    Cholecystectomy: 2000   Appendectomy: 1970  Preventive Screening:  Has patient had any of the following test?as per patient as he was getting regular mammogram done but we cannot find any record in   [place] breast cancer Mammography (1)  Smoking History: Smoking History quit 1980's (early)(1)  PFSH: Additional Past Medical and Surgical History: Past Medical History  Hypercholesterolemia.    Past Surgical History:  Appendectomy in 1970.  Gallbladder removal in 2000.  Cone biopsy for cervical dysplasia in the  early 80's.  Breast biopsy in early 2000 which was negative.    Family History:  There is a family history of heart disease. She denies a family history of any cancer, diabetes, or any bleeding disorders.    Social History:  She is married. She does not have any children. She works at a [employer]. She has a history of smoking about 1 pack every 2 days from 1968-1990. She denies any alcohol use. She denies any recreational drug use. She denies any radiation exposure         ADVANCED DIRECTIVES:    HEALTH MAINTENANCE: Social History  Substance Use Topics  . Smoking status: Former Smoker -- 0.50 packs/day for 6 years    Types: Cigarettes    Quit date: 03/10/1984  . Smokeless tobacco: Never Used  . Alcohol Use: No      Allergies  Allergen Reactions  .  Amoxicillin     REACTION: rash  . Fluconazole     REACTION: hives  . Gabapentin     depression    Current Outpatient Prescriptions  Medication Sig Dispense Refill  . ALPRAZolam (XANAX) 0.5 MG tablet Take 1 tablet (0.5 mg total) by mouth at bedtime as needed for sleep. 30 tablet 3  . atorvastatin (LIPITOR) 10 MG tablet Take 1 tablet (10 mg total) by mouth daily. (Patient taking differently: Take 10 mg by mouth daily at 6 PM. ) 30 tablet 5  . b complex vitamins tablet Take 1 tablet by mouth daily.      . cyanocobalamin (,VITAMIN B-12,) 1000 MCG/ML injection Inject 1,000 mcg into the muscle every 30 (thirty) days.      . ferrous sulfate 325 (65 FE) MG tablet Take 325 mg by mouth daily with breakfast.      . fluticasone (FLONASE) 50 MCG/ACT nasal spray instill 2 sprays into each nostril once daily 16 g 5  . glipiZIDE (GLUCOTROL) 10 MG tablet Take 20 mg by mouth daily before breakfast. Take 2 tabs daily on the day of chemotherapy and the day after chemo    . JANUMET XR 50-1000 MG TB24 Take 2 tablets by mouth daily.  0  . lidocaine-prilocaine (EMLA) cream APPLY TO AFFECTED AREA ONCE A DAY  0  . lisinopril  (PRINIVIL,ZESTRIL) 5 MG tablet Take 1 tablet (5 mg total) by mouth daily. (Patient taking differently: Take 5 mg by mouth every morning. ) 30 tablet 5  . mirtazapine (REMERON) 30 MG tablet take 2 tablet by mouth at bedtime  0  . omeprazole (PRILOSEC) 20 MG capsule Take 1 capsule (20 mg total) by mouth daily. (Patient taking differently: Take 20 mg by mouth at bedtime. ) 30 capsule 5  . ondansetron (ZOFRAN) 8 MG tablet take 1 tablet by mouth twice a day (START ON THE 3RD DAY AFTER CHEMOTHERAPY) 30 tablet 1  . ONE TOUCH ULTRA TEST test strip   0  . rOPINIRole (REQUIP) 1 MG tablet 1 pill in the am as needed by mouth and 3 pills at bedtime by mouth (Patient taking differently: Take 1 mg by mouth. 1 pill in the am as needed by mouth and 3 pills at bedtime by mouth) 120 tablet 5  . sertraline (ZOLOFT) 100 MG tablet Take 200 mg by mouth daily.  0  . traMADol (ULTRAM) 50 MG tablet Take by mouth.    . magnesium chloride (SLOW-MAG) 64 MG TBEC SR tablet Take 1 tablet (64 mg total) by mouth daily. 30 tablet 2  . potassium chloride (K-DUR) 10 MEQ tablet Take 1 tablet (10 mEq total) by mouth daily. 30 tablet 2   No current facility-administered medications for this visit.   Facility-Administered Medications Ordered in Other Visits  Medication Dose Route Frequency Provider Last Rate Last Dose  . heparin lock flush 100 unit/mL  500 Units Intravenous Once [name], MD        OBJECTIVE: PHYSICAL EXAM: Gen. Status: Patient is alert oriented not in any acute distress HEENT: Patient previously had tonsillar cancer status post chemoradiation therapy and resection.  There is no evidence of recurrent disease Lymphatic system: Supraclavicular, cervical,  inguinal lymph nodes are not palpable, There might be palpable left axillary lymph node. Examination of the chest was unremarkable. There were no bony deformities, no asymmetry, and no other abnormalities.  Cardiac exam revealed the PMI to be normally situated  and sized. The rhythm was regular and no extrasystoles  were noted during several minutes of auscultation. The first and second heart sounds were normal and physiologic splitting of the second heart sound was noted. There were no murmurs, rubs, clicks, or gallops. Neurologically, the patient was awake, alert, and oriented to person, place and time. There were no obvious focal neurologic abnormalities. Examination of breasts: Right breast free of masses.  Left breast palpable mass 4.5-5 cm.  Masses decreased in size and redness has resolved.  No palpable axillary lymph node  Filed Vitals:   07/24/15 0907  BP: 133/73  Pulse: 98  Temp: 96.8 F (36 C)     Body mass index is 34.29 kg/(m^2).    ECOG FS:1 - Symptomatic but completely ambulatory  LAB RESULTS:  Lab data has been reviewed.   Lab data has been reviewed  STUDIES: No results found.  ASSESSMENT: carcinoma of breast.  Left upper and outer quadrant in size of tumor. Palpable axillary lymph node initial biopsy of lymph node was negative however there is strong radiological suspicion of the lymph node involvement.  Hypomagnesemia Will start patient on IV magnesium.  Also will be started on Slow-Mag 1 tablet twice a day Hypokalemia IV potassium supplements will be given Start  Potassium   BY mouth.  Magnesium will be checked on day 8 and 15 and 4 less than 1.4 mg patient will receive 2 g of IV magnesium.  And 4 less than 1.7 will receive 1 g of IV magnesium PLAN:   Continue cycle 3 with carboplatinum on day 188 and 15 Taxol. After this cycle patient will be evaluated by Dr. Doreatha Lew for possibility of lumpectomy versus mastectomy. The patient also will be evaluated by my associate Dr. Ezzie Dural after surgical decision is made. If lumpectomy is done possibility of radiation therapy and a mastectomy is done still consideration could be for local radiation therapy as originally mass was more than 4 cm  Patient expressed understanding and was in  agreement with this plan. She also understands that She can call clinic at any time with any questions, concerns, or complaints.    No matching staging information was found for the patient.  [name], MD   07/24/2015 9:25 AM

## 2015-07-25 ENCOUNTER — Encounter: Payer: Self-pay | Admitting: *Deleted

## 2015-07-25 NOTE — Progress Notes (Signed)
  Oncology Nurse Navigator Documentation  Navigator Location: [facility] (07/25/15 1200) Navigator Encounter Type: Telephone (07/25/15 1200) Telephone: Incoming Call (07/25/15 1200)           Treatment Phase: Active Tx (07/25/15 1200)     Interventions: Coordination of Care (07/25/15 1200)                      Time Spent with Patient: 15 (07/25/15 1200)   Patient called to see if she could take a TDAP injection.  Her friend has a premie, and anyone around the baby needs to have the injection.  Per Dr. Oliva Bustard it will be ok to take, it just may not work for her.  Informed patient OK to take, but it may not work.  She is going to proceed with taking the injection.

## 2015-07-31 ENCOUNTER — Other Ambulatory Visit: Payer: Self-pay | Admitting: Internal Medicine

## 2015-07-31 ENCOUNTER — Inpatient Hospital Stay: Payer: Medicare Other

## 2015-07-31 DIAGNOSIS — Z171 Estrogen receptor negative status [ER-]: Secondary | ICD-10-CM | POA: Diagnosis not present

## 2015-07-31 DIAGNOSIS — Z923 Personal history of irradiation: Secondary | ICD-10-CM | POA: Diagnosis not present

## 2015-07-31 DIAGNOSIS — C50412 Malignant neoplasm of upper-outer quadrant of left female breast: Secondary | ICD-10-CM

## 2015-07-31 DIAGNOSIS — Z5111 Encounter for antineoplastic chemotherapy: Secondary | ICD-10-CM | POA: Diagnosis not present

## 2015-07-31 DIAGNOSIS — Z9221 Personal history of antineoplastic chemotherapy: Secondary | ICD-10-CM | POA: Diagnosis not present

## 2015-07-31 DIAGNOSIS — Z85818 Personal history of malignant neoplasm of other sites of lip, oral cavity, and pharynx: Secondary | ICD-10-CM | POA: Diagnosis not present

## 2015-07-31 LAB — COMPREHENSIVE METABOLIC PANEL
ALBUMIN: 3.7 g/dL (ref 3.5–5.0)
ALT: 28 U/L (ref 14–54)
AST: 34 U/L (ref 15–41)
Alkaline Phosphatase: 65 U/L (ref 38–126)
Anion gap: 6 (ref 5–15)
BUN: 13 mg/dL (ref 6–20)
CHLORIDE: 103 mmol/L (ref 101–111)
CO2: 24 mmol/L (ref 22–32)
Calcium: 8.7 mg/dL — ABNORMAL LOW (ref 8.9–10.3)
Creatinine, Ser: 0.69 mg/dL (ref 0.44–1.00)
GFR calc Af Amer: >60 mL/min (ref >60)
GFR calc non Af Amer: >60 mL/min (ref >60)
GLUCOSE: 142 mg/dL — AB (ref 65–99)
POTASSIUM: 3.5 mmol/L (ref 3.5–5.1)
Sodium: 133 mmol/L — ABNORMAL LOW (ref 135–145)
TOTAL PROTEIN: 6.8 g/dL (ref 6.5–8.1)
Total Bilirubin: 0.6 mg/dL (ref 0.3–1.2)

## 2015-07-31 LAB — CBC WITH DIFFERENTIAL/PLATELET
BASOS ABS: 0 10*3/uL (ref 0–0.1)
BASOS PCT: 1 %
EOS PCT: 2 %
Eosinophils Absolute: 0 10*3/uL (ref 0–0.7)
HCT: 26.3 % — ABNORMAL LOW (ref 35.0–47.0)
Hemoglobin: 9.1 g/dL — ABNORMAL LOW (ref 12.0–16.0)
Lymphocytes Relative: 15 %
Lymphs Abs: 0.3 10*3/uL — ABNORMAL LOW (ref 1.0–3.6)
MCH: 32 pg (ref 26.0–34.0)
MCHC: 34.5 g/dL (ref 32.0–36.0)
MCV: 92.9 fL (ref 80.0–100.0)
MONO ABS: 0.1 10*3/uL — AB (ref 0.2–0.9)
Monocytes Relative: 4 %
NEUTROS ABS: 1.7 10*3/uL (ref 1.4–6.5)
Neutrophils Relative %: 78 %
PLATELETS: 148 10*3/uL — AB (ref 150–440)
RBC: 2.83 MIL/uL — AB (ref 3.80–5.20)
RDW: 16.4 % — AB (ref 11.5–14.5)
WBC: 2.1 10*3/uL — AB (ref 3.6–11.0)

## 2015-07-31 LAB — MAGNESIUM: MAGNESIUM: 1.4 mg/dL — AB (ref 1.7–2.4)

## 2015-07-31 MED ORDER — SODIUM CHLORIDE 0.9 % IV SOLN: 2.0000 g | Freq: Once | INTRAVENOUS | Status: DC

## 2015-07-31 MED ORDER — SODIUM CHLORIDE 0.9% FLUSH
10.0000 mL | INTRAVENOUS | Status: DC | PRN
  Administered 2015-07-31: 10 mL via INTRAVENOUS
  Filled 2015-07-31: qty 10

## 2015-07-31 MED ORDER — SODIUM CHLORIDE 0.9 % IV SOLN
Freq: Once | INTRAVENOUS | Status: AC
  Administered 2015-07-31: 10:00:00 via INTRAVENOUS
  Filled 2015-07-31: qty 4

## 2015-07-31 MED ORDER — HEPARIN SOD (PORK) LOCK FLUSH 100 UNIT/ML IV SOLN
500.0000 [IU] | Freq: Once | INTRAVENOUS | Status: AC
  Administered 2015-07-31: 500 [IU] via INTRAVENOUS

## 2015-07-31 MED ORDER — FAMOTIDINE IN NACL 20-0.9 MG/50ML-% IV SOLN
20.0000 mg | Freq: Once | INTRAVENOUS | Status: AC
  Administered 2015-07-31: 20 mg via INTRAVENOUS
  Filled 2015-07-31: qty 50

## 2015-07-31 MED ORDER — SODIUM CHLORIDE 0.9 % IV SOLN: Freq: Once | INTRAVENOUS | Status: DC

## 2015-07-31 MED ORDER — SODIUM CHLORIDE 0.9 % IV SOLN
Freq: Once | INTRAVENOUS | Status: AC
  Administered 2015-07-31: 10:00:00 via INTRAVENOUS
  Filled 2015-07-31: qty 1000

## 2015-07-31 MED ORDER — MAGNESIUM SULFATE 2 GM/50ML IV SOLN
2.0000 g | Freq: Once | INTRAVENOUS | Status: AC
  Administered 2015-07-31: 2 g via INTRAVENOUS
  Filled 2015-07-31: qty 50

## 2015-07-31 MED ORDER — DIPHENHYDRAMINE HCL 50 MG/ML IJ SOLN
50.0000 mg | Freq: Once | INTRAMUSCULAR | Status: DC
Start: 2015-07-31 — End: 2015-07-31
  Filled 2015-07-31: qty 1

## 2015-07-31 MED ORDER — SODIUM CHLORIDE 0.9 % IV SOLN: 10.0000 mg | Freq: Once | INTRAVENOUS | Status: DC

## 2015-07-31 MED ORDER — DIPHENHYDRAMINE HCL 50 MG/ML IJ SOLN
25.0000 mg | Freq: Once | INTRAMUSCULAR | Status: AC
  Administered 2015-07-31: 25 mg via INTRAVENOUS

## 2015-07-31 MED ORDER — PACLITAXEL CHEMO INJECTION 300 MG/50ML
80.0000 mg/m2 | Freq: Once | INTRAVENOUS | Status: AC
  Administered 2015-07-31: 168 mg via INTRAVENOUS
  Filled 2015-07-31: qty 28

## 2015-08-01 ENCOUNTER — Telehealth: Payer: Self-pay | Admitting: *Deleted

## 2015-08-01 DIAGNOSIS — G629 Polyneuropathy, unspecified: Secondary | ICD-10-CM

## 2015-08-01 MED ORDER — GABAPENTIN 100 MG PO CAPS: 200.0000 mg | ORAL_CAPSULE | Freq: Two times a day (BID) | ORAL | Status: DC

## 2015-08-01 NOTE — Telephone Encounter (Addendum)
Called to report that a prescription was to be called to [facility] on [address] and they did not receive it form [place]. Please advise

## 2015-08-01 NOTE — Telephone Encounter (Signed)
MD recommended Gabapentin 200 mg twice daily.  RN reviewed chart there is an allergy alert for gabapentin with side effect of 'depression'. Will need to clarify with patient before this is prescribed.

## 2015-08-01 NOTE — Telephone Encounter (Signed)
Contacted patient she states that she "never had any problems with taking gabapentin in the past." She states that she "never had any history of depression."  Will proceed with prescribing gabapentin per md order.

## 2015-08-06 DIAGNOSIS — E119 Type 2 diabetes mellitus without complications: Secondary | ICD-10-CM | POA: Diagnosis not present

## 2015-08-07 ENCOUNTER — Inpatient Hospital Stay: Payer: Medicare Other

## 2015-08-07 DIAGNOSIS — Z171 Estrogen receptor negative status [ER-]: Secondary | ICD-10-CM | POA: Diagnosis not present

## 2015-08-07 DIAGNOSIS — C50412 Malignant neoplasm of upper-outer quadrant of left female breast: Secondary | ICD-10-CM

## 2015-08-07 DIAGNOSIS — Z9221 Personal history of antineoplastic chemotherapy: Secondary | ICD-10-CM | POA: Diagnosis not present

## 2015-08-07 DIAGNOSIS — Z5111 Encounter for antineoplastic chemotherapy: Secondary | ICD-10-CM | POA: Diagnosis not present

## 2015-08-07 DIAGNOSIS — Z85818 Personal history of malignant neoplasm of other sites of lip, oral cavity, and pharynx: Secondary | ICD-10-CM | POA: Diagnosis not present

## 2015-08-07 DIAGNOSIS — Z923 Personal history of irradiation: Secondary | ICD-10-CM | POA: Diagnosis not present

## 2015-08-07 LAB — CBC WITH DIFFERENTIAL/PLATELET
BASOS ABS: 0 10*3/uL (ref 0–0.1)
EOS ABS: 0 10*3/uL (ref 0–0.7)
Eosinophils Relative: 2 %
HEMATOCRIT: 24.3 % — AB (ref 35.0–47.0)
Hemoglobin: 8.5 g/dL — ABNORMAL LOW (ref 12.0–16.0)
Lymphocytes Relative: 32 %
Lymphs Abs: 0.4 10*3/uL — ABNORMAL LOW (ref 1.0–3.6)
MCH: 32.5 pg (ref 26.0–34.0)
MCHC: 34.8 g/dL (ref 32.0–36.0)
MCV: 93.4 fL (ref 80.0–100.0)
MONO ABS: 0.1 10*3/uL — AB (ref 0.2–0.9)
Monocytes Relative: 9 %
NEUTROS ABS: 0.7 10*3/uL — AB (ref 1.4–6.5)
Platelets: 222 10*3/uL (ref 150–440)
RBC: 2.6 MIL/uL — ABNORMAL LOW (ref 3.80–5.20)
RDW: 16.5 % — AB (ref 11.5–14.5)
WBC: 1.3 10*3/uL — CL (ref 3.6–11.0)

## 2015-08-07 LAB — MAGNESIUM: MAGNESIUM: 1.2 mg/dL — AB (ref 1.7–2.4)

## 2015-08-07 MED ORDER — MAGNESIUM SULFATE 50 % IJ SOLN: 2.0000 g | Freq: Once | INTRAMUSCULAR | Status: DC

## 2015-08-07 MED ORDER — HEPARIN SOD (PORK) LOCK FLUSH 100 UNIT/ML IV SOLN
500.0000 [IU] | Freq: Once | INTRAVENOUS | Status: AC
  Administered 2015-08-07: 500 [IU] via INTRAVENOUS
  Filled 2015-08-07: qty 5

## 2015-08-07 MED ORDER — MAGNESIUM SULFATE 2 GM/50ML IV SOLN
2.0000 g | Freq: Once | INTRAVENOUS | Status: AC
  Administered 2015-08-07: 2 g via INTRAVENOUS
  Filled 2015-08-07: qty 50

## 2015-08-13 DIAGNOSIS — E119 Type 2 diabetes mellitus without complications: Secondary | ICD-10-CM | POA: Diagnosis not present

## 2015-08-27 ENCOUNTER — Encounter: Payer: Self-pay | Admitting: General Surgery

## 2015-08-27 ENCOUNTER — Other Ambulatory Visit: Payer: Self-pay

## 2015-08-27 ENCOUNTER — Ambulatory Visit (INDEPENDENT_AMBULATORY_CARE_PROVIDER_SITE_OTHER): Payer: Medicare Other | Admitting: General Surgery

## 2015-08-27 ENCOUNTER — Other Ambulatory Visit: Payer: Self-pay | Admitting: *Deleted

## 2015-08-27 VITALS — BP 130/68 | HR 87 | Resp 12 | Ht 66.0 in | Wt 207.0 lb

## 2015-08-27 DIAGNOSIS — C50412 Malignant neoplasm of upper-outer quadrant of left female breast: Secondary | ICD-10-CM

## 2015-08-27 NOTE — Patient Instructions (Signed)
The patent is scheduled for surgery at [facility] on 10/02/15. She will pre admit by phone. Patient is aware of date and instructions.

## 2015-08-27 NOTE — Progress Notes (Signed)
Patient ID: Suzanne Strong, female   DOB: 1946-01-28, 70 y.o.   MRN: [id number]  Chief Complaint  Patient presents with  . Other    breast cancer surgery discussion    HPI Suzanne Strong is a 70 y.o. female here today to discuss breast surgery. She has undergone 10 chemotherapy treatments possibly having 2 more treatments. Patient states she has an appointment with Dr. Patrecia Pace tomorrow.  She has tolerated it well, has some fatigue and tingling in her fingers. She does have some occasional tenderness in her left breast outer quadrant.  HPI  Past Medical History  Diagnosis Date  . Hyperlipidemia   . Diverticulosis   . Shingles     Hx of  . Gastroparesis     related to previous radiation tx  . Anxiety   . Anemia     iron deficiency and B12 deficiency  . Cervical dysplasia     conization  . Diabetes mellitus without complication ([place])   . Hypertension   . GERD (gastroesophageal reflux disease)   . Fever 04/11/2015  . 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[place]) 2006  . Breast cancer ([place]) 02/25/2015    upper-outer quadrant of left female breast, ER, PR, HER-2 Negative    Past Surgical History  Procedure Laterality Date  . Breast surgery      breast biopsy benign  . Appendectomy    . Tonsillectomy      cancer treated with chemo and radiation  . Cholecystectomy    . Mole removal    . Esophagogastroduodenoscopy  07/2009    normal with few gastric polyps  . Breast cyst aspiration    . Breast biopsy Left 02/25/2015    path pending  . Portacath placement    . Port-a-cath removal    . Radical neck dissection    . Portacath placement Right 03/12/2015    Procedure: INSERTION PORT-A-CATH;  Surgeon: [name], MD;  Location: [facility] ORS;  Service: General;  Laterality: Right;    Family History  Problem Relation Age of Onset  . Osteoporosis Mother   . Hyperlipidemia Mother   . Depression Mother   . Cancer Mother     skin cancer ? basal cell and lung ca heavy smoker  . Alcohol abuse  Father   . Cancer Father     skin CA ? basal cell  . Heart disease Father     CHF  . Hyperlipidemia Brother   . Hypertension Brother     Social History Social History  Substance Use Topics  . Smoking status: Former Smoker -- 0.50 packs/day for 6 years    Types: Cigarettes    Quit date: 03/10/1984  . Smokeless tobacco: Never Used  . Alcohol Use: No    Allergies  Allergen Reactions  . Amoxicillin     REACTION: rash  . Fluconazole     REACTION: hives    Current Outpatient Prescriptions  Medication Sig Dispense Refill  . ALPRAZolam (XANAX) 0.5 MG tablet Take 1 tablet (0.5 mg total) by mouth at bedtime as needed for sleep. 30 tablet 3  . atorvastatin (LIPITOR) 10 MG tablet Take 1 tablet (10 mg total) by mouth daily. (Patient taking differently: Take 10 mg by mouth daily at 6 PM. ) 30 tablet 5  . b complex vitamins tablet Take 1 tablet by mouth daily.      . cyanocobalamin (,VITAMIN B-12,) 1000 MCG/ML injection Inject 1,000 mcg into the muscle every 30 (thirty) days.      [name]  ferrous sulfate 325 (65 FE) MG tablet Take 325 mg by mouth daily with breakfast. Reported on 08/27/2015    . fluticasone (FLONASE) 50 MCG/ACT nasal spray instill 2 sprays into each nostril once daily 16 g 5  . gabapentin (NEURONTIN) 100 MG capsule Take 2 capsules (200 mg total) by mouth 2 (two) times daily. 120 capsule 3  . glipiZIDE (GLUCOTROL) 10 MG tablet Take 20 mg by mouth daily before breakfast. Take 2 tabs daily on the day of chemotherapy and the day after chemo    . JANUMET XR 50-1000 MG TB24 Take 2 tablets by mouth daily.  0  . lidocaine-prilocaine (EMLA) cream APPLY TO AFFECTED AREA ONCE A DAY  0  . lisinopril (PRINIVIL,ZESTRIL) 5 MG tablet Take 1 tablet (5 mg total) by mouth daily. (Patient taking differently: Take 5 mg by mouth every morning. ) 30 tablet 5  . magnesium chloride (SLOW-MAG) 64 MG TBEC SR tablet Take 1 tablet (64 mg total) by mouth daily. 30 tablet 2  . mirtazapine (REMERON) 30 MG tablet  take 2 tablet by mouth at bedtime  0  . omeprazole (PRILOSEC) 20 MG capsule Take 1 capsule (20 mg total) by mouth daily. (Patient taking differently: Take 20 mg by mouth at bedtime. ) 30 capsule 5  . ondansetron (ZOFRAN) 8 MG tablet take 1 tablet by mouth twice a day (START ON THE 3RD DAY AFTER CHEMOTHERAPY) 30 tablet 1  . ONE TOUCH ULTRA TEST test strip   0  . potassium chloride (K-DUR) 10 MEQ tablet Take 1 tablet (10 mEq total) by mouth daily. 30 tablet 2  . rOPINIRole (REQUIP) 1 MG tablet 1 pill in the am as needed by mouth and 3 pills at bedtime by mouth (Patient taking differently: Take 1 mg by mouth. 1 pill in the am as needed by mouth and 3 pills at bedtime by mouth) 120 tablet 5  . sertraline (ZOLOFT) 100 MG tablet Take 200 mg by mouth daily. Reported on 08/27/2015  0   No current facility-administered medications for this visit.    Review of Systems Review of Systems  Constitutional: Positive for fatigue.  Respiratory: Negative.   Cardiovascular: Negative.     Blood pressure 130/68, pulse 87, resp. rate 12, height 5' 6" (1.676 m), weight 207 lb (93.895 kg).  Physical Exam Physical Exam  Constitutional: She is oriented to person, place, and time. She appears well-developed and well-nourished.  Eyes: Conjunctivae are normal. No scleral icterus.  Neck: Neck supple. No thyromegaly present.  Cardiovascular: Normal rate, regular rhythm and normal heart sounds.   Pulmonary/Chest: Effort normal and breath sounds normal. Right breast exhibits no inverted nipple, no mass, no nipple discharge, no skin change and no tenderness. Left breast exhibits no inverted nipple, no mass, no nipple discharge, no skin change and no tenderness.    Lymphadenopathy:    She has no cervical adenopathy.  Neurological: She is alert and oriented to person, place, and time.  Skin: Skin is warm and dry.    Data Reviewed Ultrasound examination of the left breast shows no discernible echo architecture  abnormality suggesting complete response to neoadjuvant chemotherapy.  Assessment    Excellent clinical response to neoadjuvant chemotherapy.    Plan    The T3 lesion has now been down staged and no palpable disease. The indication for wire localization and excision of the original biopsy site as well as sentinel node evaluation has been reviewed.   The patient will complete her last 2 treatments  on July 19. We'll plan to look to mid-August for surgical intervention.    Patient has an appointment at the Cancer center on 08/28/15 with  Dr. Patrecia Pace.  The patent is scheduled for surgery at [facility] on 10/02/15. She will pre admit by phone. Patient is aware of date and instructions.   This information has been scribed by Verlene Mayer, CMA    PCP: Dr. Gerald Dexter, [name] 08/27/2015, 10:25 PM

## 2015-08-28 ENCOUNTER — Inpatient Hospital Stay: Payer: Medicare Other

## 2015-08-28 ENCOUNTER — Inpatient Hospital Stay: Payer: Medicare Other | Attending: Internal Medicine | Admitting: Internal Medicine

## 2015-08-28 VITALS — BP 121/66 | HR 87 | Temp 97.8°F | Resp 18 | Wt 207.9 lb

## 2015-08-28 DIAGNOSIS — C50412 Malignant neoplasm of upper-outer quadrant of left female breast: Secondary | ICD-10-CM

## 2015-08-28 DIAGNOSIS — E538 Deficiency of other specified B group vitamins: Secondary | ICD-10-CM | POA: Diagnosis not present

## 2015-08-28 DIAGNOSIS — Z171 Estrogen receptor negative status [ER-]: Secondary | ICD-10-CM | POA: Insufficient documentation

## 2015-08-28 DIAGNOSIS — K5790 Diverticulosis of intestine, part unspecified, without perforation or abscess without bleeding: Secondary | ICD-10-CM | POA: Insufficient documentation

## 2015-08-28 DIAGNOSIS — D709 Neutropenia, unspecified: Secondary | ICD-10-CM | POA: Diagnosis not present

## 2015-08-28 DIAGNOSIS — Z85819 Personal history of malignant neoplasm of unspecified site of lip, oral cavity, and pharynx: Secondary | ICD-10-CM | POA: Insufficient documentation

## 2015-08-28 DIAGNOSIS — E785 Hyperlipidemia, unspecified: Secondary | ICD-10-CM | POA: Insufficient documentation

## 2015-08-28 DIAGNOSIS — E119 Type 2 diabetes mellitus without complications: Secondary | ICD-10-CM | POA: Diagnosis not present

## 2015-08-28 DIAGNOSIS — D509 Iron deficiency anemia, unspecified: Secondary | ICD-10-CM | POA: Diagnosis not present

## 2015-08-28 DIAGNOSIS — F419 Anxiety disorder, unspecified: Secondary | ICD-10-CM | POA: Insufficient documentation

## 2015-08-28 DIAGNOSIS — K219 Gastro-esophageal reflux disease without esophagitis: Secondary | ICD-10-CM | POA: Insufficient documentation

## 2015-08-28 DIAGNOSIS — Z801 Family history of malignant neoplasm of trachea, bronchus and lung: Secondary | ICD-10-CM | POA: Insufficient documentation

## 2015-08-28 DIAGNOSIS — G629 Polyneuropathy, unspecified: Secondary | ICD-10-CM | POA: Insufficient documentation

## 2015-08-28 DIAGNOSIS — I10 Essential (primary) hypertension: Secondary | ICD-10-CM | POA: Diagnosis not present

## 2015-08-28 DIAGNOSIS — Z923 Personal history of irradiation: Secondary | ICD-10-CM | POA: Diagnosis not present

## 2015-08-28 DIAGNOSIS — Z5111 Encounter for antineoplastic chemotherapy: Secondary | ICD-10-CM | POA: Diagnosis not present

## 2015-08-28 DIAGNOSIS — Z808 Family history of malignant neoplasm of other organs or systems: Secondary | ICD-10-CM

## 2015-08-28 DIAGNOSIS — Z87891 Personal history of nicotine dependence: Secondary | ICD-10-CM | POA: Diagnosis not present

## 2015-08-28 DIAGNOSIS — K3184 Gastroparesis: Secondary | ICD-10-CM | POA: Insufficient documentation

## 2015-08-28 DIAGNOSIS — B029 Zoster without complications: Secondary | ICD-10-CM | POA: Diagnosis not present

## 2015-08-28 LAB — COMPREHENSIVE METABOLIC PANEL
ALT: 29 U/L (ref 14–54)
ANION GAP: 6 (ref 5–15)
AST: 36 U/L (ref 15–41)
Albumin: 3.8 g/dL (ref 3.5–5.0)
Alkaline Phosphatase: 81 U/L (ref 38–126)
BUN: 17 mg/dL (ref 6–20)
CALCIUM: 9.1 mg/dL (ref 8.9–10.3)
CO2: 25 mmol/L (ref 22–32)
Chloride: 105 mmol/L (ref 101–111)
Creatinine, Ser: 0.54 mg/dL (ref 0.44–1.00)
GLUCOSE: 134 mg/dL — AB (ref 65–99)
POTASSIUM: 3.7 mmol/L (ref 3.5–5.1)
SODIUM: 136 mmol/L (ref 135–145)
TOTAL PROTEIN: 7.4 g/dL (ref 6.5–8.1)
Total Bilirubin: 0.5 mg/dL (ref 0.3–1.2)

## 2015-08-28 LAB — CBC WITH DIFFERENTIAL/PLATELET
Basophils Absolute: 0 10*3/uL (ref 0–0.1)
Basophils Relative: 1 %
EOS ABS: 0.1 10*3/uL (ref 0–0.7)
EOS PCT: 2 %
HCT: 29.5 % — ABNORMAL LOW (ref 35.0–47.0)
Hemoglobin: 10.3 g/dL — ABNORMAL LOW (ref 12.0–16.0)
LYMPHS ABS: 0.6 10*3/uL — AB (ref 1.0–3.6)
LYMPHS PCT: 17 %
MCH: 32.2 pg (ref 26.0–34.0)
MCHC: 34.8 g/dL (ref 32.0–36.0)
MCV: 92.6 fL (ref 80.0–100.0)
MONO ABS: 0.4 10*3/uL (ref 0.2–0.9)
MONOS PCT: 10 %
Neutro Abs: 2.7 10*3/uL (ref 1.4–6.5)
Neutrophils Relative %: 70 %
PLATELETS: 237 10*3/uL (ref 150–440)
RBC: 3.18 MIL/uL — ABNORMAL LOW (ref 3.80–5.20)
RDW: 16.5 % — AB (ref 11.5–14.5)
WBC: 3.9 10*3/uL (ref 3.6–11.0)

## 2015-08-28 LAB — MAGNESIUM: Magnesium: 1.5 mg/dL — ABNORMAL LOW (ref 1.7–2.4)

## 2015-08-28 MED ORDER — PACLITAXEL CHEMO INJECTION 300 MG/50ML
80.0000 mg/m2 | Freq: Once | INTRAVENOUS | Status: AC
  Administered 2015-08-28: 168 mg via INTRAVENOUS
  Filled 2015-08-28: qty 28

## 2015-08-28 MED ORDER — PALONOSETRON HCL INJECTION 0.25 MG/5ML
0.2500 mg | Freq: Once | INTRAVENOUS | Status: AC
  Administered 2015-08-28: 0.25 mg via INTRAVENOUS
  Filled 2015-08-28: qty 5

## 2015-08-28 MED ORDER — MAGNESIUM SULFATE 2 GM/50ML IV SOLN
2.0000 g | Freq: Once | INTRAVENOUS | Status: AC
  Administered 2015-08-28: 2 g via INTRAVENOUS
  Filled 2015-08-28: qty 50

## 2015-08-28 MED ORDER — SODIUM CHLORIDE 0.9 % IV SOLN: 2.0000 g | INTRAVENOUS | Status: DC | PRN

## 2015-08-28 MED ORDER — DEXAMETHASONE SODIUM PHOSPHATE 100 MG/10ML IJ SOLN
10.0000 mg | Freq: Once | INTRAMUSCULAR | Status: AC
  Administered 2015-08-28: 10 mg via INTRAVENOUS
  Filled 2015-08-28: qty 1

## 2015-08-28 MED ORDER — DIPHENHYDRAMINE HCL 50 MG/ML IJ SOLN
50.0000 mg | Freq: Once | INTRAMUSCULAR | Status: AC
  Administered 2015-08-28: 50 mg via INTRAVENOUS
  Filled 2015-08-28: qty 1

## 2015-08-28 MED ORDER — FAMOTIDINE IN NACL 20-0.9 MG/50ML-% IV SOLN
20.0000 mg | Freq: Once | INTRAVENOUS | Status: AC
Start: 2015-08-28 — End: 2015-08-28
  Administered 2015-08-28: 20 mg via INTRAVENOUS
  Filled 2015-08-28: qty 50

## 2015-08-28 MED ORDER — HEPARIN SOD (PORK) LOCK FLUSH 100 UNIT/ML IV SOLN
500.0000 [IU] | Freq: Once | INTRAVENOUS | Status: AC
  Administered 2015-08-28: 500 [IU] via INTRAVENOUS
  Filled 2015-08-28: qty 5

## 2015-08-28 MED ORDER — CARBOPLATIN CHEMO INJECTION 600 MG/60ML
537.5000 mg | Freq: Once | INTRAVENOUS | Status: AC
  Administered 2015-08-28: 540 mg via INTRAVENOUS
  Filled 2015-08-28: qty 54

## 2015-08-28 MED ORDER — SODIUM CHLORIDE 0.9 % IV SOLN
Freq: Once | INTRAVENOUS | Status: AC
  Administered 2015-08-28: 10:00:00 via INTRAVENOUS
  Filled 2015-08-28: qty 1000

## 2015-08-28 NOTE — Assessment & Plan Note (Addendum)
Currently on neoadjuvant chemotherapy; excellent response. s/p 3 cycles of carbo-taxol.   # Proceed with - cycle # 4- D1 carbotaxol today; she will need 2 more treatments of Taxol; last treatment on July 26th.   # PN- G-1; go up to 300 TID.  # Will discuss with Dr. Bary Castilla re: timing of surgery.  # Patient follow-up with me in 2 weeks with labs chemotherapy.

## 2015-08-28 NOTE — Progress Notes (Signed)
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 5 CM TUMOR MASS BIOPSIES POSITIVE FOR INVASIVE MAMMARY CARCINOMA TRIPLE NEGATIVE DISEASE(jANUARY, 2017)stage IIIa. 3. MUGA scan of the heart shows ejection fraction to be 61% (January, 2017) 4  4. Patient will start chemotherapy with Cytoxan and Adriamycin from March 18, 2015     Cancer of left breast [facility])   03/01/2015 Initial Diagnosis Cancer of left breast ([place])    Malignant neoplasm of upper-outer quadrant of left female breast ([place])   03/11/2015 Initial Diagnosis Malignant neoplasm of upper-outer quadrant of left female breast [facility])    INTERVAL HISTORY:  This is my first interaction with the patient as patient's primary oncologist has been Dr.Choksi. I reviewed the patient's prior charts/pertinent labs/imaging in detail; findings are summarized above.   Bosie Helper Crace 70 y.o.  female pleasant patient above history of stage IIIa breast cancer currently on neoadjuvant chemotherapy status post Adriamycin Cytoxan; currently on carbotaxol is here for follow-up.  In the interim patient had evaluation with surgery  Few days ago.  Patient complains of mild tingling and numbness of her extremities-patient is on Neurontin 200 mg twice a day.. Otherwise no significant nausea or vomiting. No chest pain or shortness of breath or cough. Denies any new lumps or bumps.  REVIEW OF SYSTEMS:  A  complete 10 point review of system is done which is negative except mentioned above/history of present illness.   PAST MEDICAL HISTORY :  Past Medical History  Diagnosis Date  . Hyperlipidemia   . Diverticulosis   . Shingles     Hx of  . Gastroparesis     related to previous radiation tx  . Anxiety   . Anemia     iron deficiency and B12 deficiency  . Cervical dysplasia     conization  . Diabetes mellitus without complication ([place])   . Hypertension   . GERD (gastroesophageal reflux disease)   . Fever 04/11/2015  . 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 Breast cancer ([place]) 02/25/2015    upper-outer quadrant of left female breast, ER, PR, HER-2 Negative    PAST SURGICAL HISTORY :   Past Surgical History  Procedure Laterality Date  . Breast surgery      breast biopsy benign  . Appendectomy    . Tonsillectomy      cancer treated with chemo and radiation  . Cholecystectomy    . Mole removal    . Esophagogastroduodenoscopy  07/2009    normal with few gastric polyps  . Breast cyst aspiration    . Breast biopsy Left 02/25/2015    path pending  . Portacath placement    . Port-a-cath removal    . Radical neck dissection    . Portacath placement Right 03/12/2015    Procedure: INSERTION PORT-A-CATH;  Surgeon: [name], MD;  Location: [facility] ORS;  Service: General;  Laterality: Right;    FAMILY HISTORY :   Family History  Problem Relation Age of Onset  . Osteoporosis Mother   . Hyperlipidemia Mother   . Depression Mother   . Cancer Mother     skin cancer ? basal cell and lung ca heavy smoker  . Alcohol abuse Father   . Cancer Father     skin CA ? basal cell  . Heart disease Father     CHF  . Hyperlipidemia Brother   . Hypertension Brother     SOCIAL HISTORY:   Social History  Substance Use Topics  . Smoking status: Former Smoker -- 0.50 packs/day for 6 years    Types: Cigarettes    Quit date: 03/10/1984  . Smokeless tobacco: Never Used  . Alcohol Use: No     ALLERGIES:  is allergic to amoxicillin and fluconazole.  MEDICATIONS:  Current Outpatient Prescriptions  Medication Sig Dispense Refill  . ALPRAZolam (XANAX) 0.5 MG tablet Take 1 tablet (0.5 mg total) by mouth at bedtime as needed for sleep. 30 tablet 3  . atorvastatin (LIPITOR) 10 MG tablet Take 1 tablet (10 mg total) by mouth daily. (Patient taking differently: Take 10 mg by mouth daily at 6 PM. ) 30 tablet 5  . b complex vitamins tablet Take 1 tablet by mouth daily.      . cyanocobalamin (,VITAMIN B-12,) 1000 MCG/ML injection Inject 1,000 mcg into the muscle every 30 (thirty) days.      . ferrous sulfate 325 (65 FE) MG tablet Take 325 mg by mouth daily with breakfast. Reported on 08/27/2015    . fluticasone (FLONASE) 50 MCG/ACT nasal spray instill 2 sprays into each nostril once daily 16 g 5  . gabapentin (NEURONTIN) 100 MG capsule Take 2 capsules (200 mg total) by mouth 2 (two) times daily. 120 capsule 3  . glipiZIDE (GLUCOTROL) 10 MG tablet Take 20 mg by mouth daily before breakfast. Take 2 tabs daily on the day of chemotherapy and the day after chemo    . JANUMET XR 50-1000 MG TB24 Take 2 tablets by mouth daily.  0  . lidocaine-prilocaine (EMLA) cream APPLY TO AFFECTED AREA ONCE A DAY  0  . lisinopril (PRINIVIL,ZESTRIL) 5 MG tablet Take 1 tablet (5 mg total) by mouth daily. (Patient taking differently: Take 5 mg by mouth every morning. ) 30 tablet 5  . magnesium chloride (SLOW-MAG) 64 MG TBEC SR tablet Take 1 tablet (64 mg total) by mouth daily. 30 tablet 2  . mirtazapine (REMERON) 30 MG tablet take 2 tablet by mouth at bedtime  0  . omeprazole (PRILOSEC) 20 MG capsule Take 1 capsule (20 mg total) by mouth daily. (Patient taking differently: Take 20 mg by mouth at bedtime. ) 30 capsule 5  . ondansetron (ZOFRAN) 8 MG tablet take 1 tablet by mouth twice a day (START ON THE 3RD DAY AFTER CHEMOTHERAPY) 30 tablet 1  . ONE TOUCH ULTRA TEST test strip   0  . potassium chloride (K-DUR) 10 MEQ  tablet Take 1 tablet (10 mEq total) by mouth daily. 30 tablet 2  . rOPINIRole (REQUIP) 1 MG tablet 1 pill in the am as needed by mouth and 3 pills at bedtime by mouth (Patient taking differently: Take 1 mg by mouth. 1 pill in the am as needed by mouth and 3 pills at bedtime by mouth) 120 tablet 5  . sertraline (ZOLOFT) 100 MG tablet Take 200 mg by mouth daily. Reported on 08/27/2015  0   No current facility-administered medications for this visit.    PHYSICAL  EXAMINATION: ECOG PERFORMANCE STATUS: 0 - Asymptomatic  BP 121/66 mmHg  Pulse 87  Temp(Src) 97.8 F (36.6 C) (Tympanic)  Resp 18  Wt 207 lb 14.3 oz (94.3 kg)  Filed Weights   08/28/15 0908  Weight: 207 lb 14.3 oz (94.3 kg)    GENERAL: Well-nourished well-developed; Alert, no distress and comfortable.   Alone. EYES: no pallor or icterus OROPHARYNX: no thrush or ulceration; good dentition  NECK: supple, no masses felt LYMPH:  no palpable lymphadenopathy in the cervical, axillary or inguinal regions LUNGS: clear to auscultation and  No wheeze or crackles HEART/CVS: regular rate & rhythm and no murmurs; No lower extremity edema ABDOMEN:abdomen soft, non-tender and normal bowel sounds Musculoskeletal:no cyanosis of digits and no clubbing  PSYCH: alert & oriented x 3 with fluent speech NEURO: no focal motor/sensory deficits SKIN:  no rashes or significant lesions  LABORATORY DATA:  I have reviewed the data as listed    Component Value Date/Time   NA 136 08/28/2015 0830   NA 142 03/01/2014 0846   K 3.7 08/28/2015 0830   K 4.3 03/01/2014 0846   CL 105 08/28/2015 0830   CL 102 03/01/2014 0846   CO2 25 08/28/2015 0830   CO2 28 03/01/2014 0846   GLUCOSE 134* 08/28/2015 0830   GLUCOSE 219* 03/01/2014 0846   BUN 17 08/28/2015 0830   BUN 12 03/01/2014 0846   CREATININE 0.54 08/28/2015 0830   CREATININE 0.95 03/01/2014 0846   CALCIUM 9.1 08/28/2015 0830   CALCIUM 8.6 03/01/2014 0846   PROT 7.4 08/28/2015 0830   PROT 7.5  03/01/2014 0846   ALBUMIN 3.8 08/28/2015 0830   ALBUMIN 3.4 03/01/2014 0846   AST 36 08/28/2015 0830   AST 20 03/01/2014 0846   ALT 29 08/28/2015 0830   ALT 28 03/01/2014 0846   ALKPHOS 81 08/28/2015 0830   ALKPHOS 122* 03/01/2014 0846   BILITOT 0.5 08/28/2015 0830   BILITOT 0.2 03/01/2014 0846   GFRNONAA >60 08/28/2015 0830   GFRNONAA >60 03/01/2014 0846   GFRNONAA >60 08/24/2013 1015   GFRAA >60 08/28/2015 0830   GFRAA >60 03/01/2014 0846   GFRAA >60 08/24/2013 1015    No results found for: SPEP, UPEP  Lab Results  Component Value Date   WBC 3.9 08/28/2015   NEUTROABS 2.7 08/28/2015   HGB 10.3* 08/28/2015   HCT 29.5* 08/28/2015   MCV 92.6 08/28/2015   PLT 237 08/28/2015      Chemistry      Component Value Date/Time   NA 136 08/28/2015 0830   NA 142 03/01/2014 0846   K 3.7 08/28/2015 0830   K 4.3 03/01/2014 0846   CL 105 08/28/2015 0830   CL 102 03/01/2014 0846   CO2 25 08/28/2015 0830   CO2 28 03/01/2014 0846   BUN 17 08/28/2015 0830   BUN 12 03/01/2014 0846   CREATININE 0.54 08/28/2015 0830   CREATININE 0.95 03/01/2014 0846      Component Value Date/Time   CALCIUM 9.1 08/28/2015 0830   CALCIUM 8.6 03/01/2014 0846   ALKPHOS 81 08/28/2015 0830   ALKPHOS 122* 03/01/2014 0846   AST 36 08/28/2015 0830   AST 20 03/01/2014 0846   ALT 29 08/28/2015 0830   ALT 28 03/01/2014 0846   BILITOT 0.5 08/28/2015 0830   BILITOT 0.2 03/01/2014 0846       RADIOGRAPHIC STUDIES: I have personally reviewed the radiological images as listed and agreed with the findings in the report. No results found.  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 s/p 3 cycles of carbo-taxol.   # Proceed with - cycle # 4- D1 carbotaxol today; she will need 2 more treatments of Taxol; last treatment on July 26th.   # PN- G-1; go up to 300 TID.  # Will discuss with Dr. Bary Castilla re:  timing of surgery.  # Patient follow-up with me in 2 weeks with labs chemotherapy.    Orders Placed This Encounter  Procedures  . CBC with Differential    Standing Status: Standing     Number of Occurrences: 2     Standing Expiration Date: 08/27/2016  . Basic metabolic panel    Standing Status: Future     Number of Occurrences:      Standing Expiration Date: 08/27/2016    Order Specific Question:  Has the patient fasted?    Answer:  No  . Magnesium    Standing Status: Standing     Number of Occurrences: 2     Standing Expiration Date: 08/27/2016  . Comprehensive metabolic panel    Standing Status: Future     Number of Occurrences:      Standing Expiration Date: 08/27/2016    Order Specific Question:  Has the patient fasted?    Answer:  No   All questions were answered. The patient knows to call the clinic with any problems, questions or concerns.      Cammie Sickle, MD 08/28/2015 8:23 PM

## 2015-09-04 ENCOUNTER — Inpatient Hospital Stay: Payer: Medicare Other

## 2015-09-04 VITALS — BP 115/71 | HR 80 | Temp 97.2°F | Resp 20

## 2015-09-04 DIAGNOSIS — Z171 Estrogen receptor negative status [ER-]: Secondary | ICD-10-CM | POA: Diagnosis not present

## 2015-09-04 DIAGNOSIS — C50412 Malignant neoplasm of upper-outer quadrant of left female breast: Secondary | ICD-10-CM

## 2015-09-04 DIAGNOSIS — D709 Neutropenia, unspecified: Secondary | ICD-10-CM | POA: Diagnosis not present

## 2015-09-04 DIAGNOSIS — F419 Anxiety disorder, unspecified: Secondary | ICD-10-CM | POA: Diagnosis not present

## 2015-09-04 DIAGNOSIS — Z5111 Encounter for antineoplastic chemotherapy: Secondary | ICD-10-CM | POA: Diagnosis not present

## 2015-09-04 LAB — BASIC METABOLIC PANEL
Anion gap: 8 (ref 5–15)
BUN: 15 mg/dL (ref 6–20)
CALCIUM: 8.7 mg/dL — AB (ref 8.9–10.3)
CO2: 26 mmol/L (ref 22–32)
Chloride: 103 mmol/L (ref 101–111)
Creatinine, Ser: 0.65 mg/dL (ref 0.44–1.00)
GFR calc Af Amer: >60 mL/min (ref >60)
GLUCOSE: 154 mg/dL — AB (ref 65–99)
Potassium: 3.9 mmol/L (ref 3.5–5.1)
SODIUM: 137 mmol/L (ref 135–145)

## 2015-09-04 LAB — CBC WITH DIFFERENTIAL/PLATELET
BASOS ABS: 0 10*3/uL (ref 0–0.1)
Basophils Relative: 1 %
Eosinophils Absolute: 0.1 10*3/uL (ref 0–0.7)
Eosinophils Relative: 2 %
HEMATOCRIT: 27.7 % — AB (ref 35.0–47.0)
Hemoglobin: 9.5 g/dL — ABNORMAL LOW (ref 12.0–16.0)
LYMPHS ABS: 0.4 10*3/uL — AB (ref 1.0–3.6)
LYMPHS PCT: 15 %
MCH: 31.6 pg (ref 26.0–34.0)
MCHC: 34.3 g/dL (ref 32.0–36.0)
MCV: 92.2 fL (ref 80.0–100.0)
MONO ABS: 0.1 10*3/uL — AB (ref 0.2–0.9)
Monocytes Relative: 4 %
NEUTROS ABS: 2 10*3/uL (ref 1.4–6.5)
Neutrophils Relative %: 78 %
Platelets: 173 10*3/uL (ref 150–440)
RBC: 3 MIL/uL — AB (ref 3.80–5.20)
RDW: 16.2 % — AB (ref 11.5–14.5)
WBC: 2.5 10*3/uL — ABNORMAL LOW (ref 3.6–11.0)

## 2015-09-04 LAB — MAGNESIUM: MAGNESIUM: 1.4 mg/dL — AB (ref 1.7–2.4)

## 2015-09-04 MED ORDER — HEPARIN SOD (PORK) LOCK FLUSH 100 UNIT/ML IV SOLN
500.0000 [IU] | Freq: Once | INTRAVENOUS | Status: AC | PRN
  Administered 2015-09-04: 500 [IU]
  Filled 2015-09-04: qty 5

## 2015-09-04 MED ORDER — SODIUM CHLORIDE 0.9 % IV SOLN: 10.0000 mg | Freq: Once | INTRAVENOUS | Status: DC

## 2015-09-04 MED ORDER — MAGNESIUM SULFATE 2 GM/50ML IV SOLN
2.0000 g | Freq: Once | INTRAVENOUS | Status: AC
  Administered 2015-09-04: 2 g via INTRAVENOUS
  Filled 2015-09-04: qty 50

## 2015-09-04 MED ORDER — SODIUM CHLORIDE 0.9 % IV SOLN
Freq: Once | INTRAVENOUS | Status: AC
  Administered 2015-09-04: 10:00:00 via INTRAVENOUS
  Filled 2015-09-04: qty 1000

## 2015-09-04 MED ORDER — DIPHENHYDRAMINE HCL 50 MG/ML IJ SOLN
50.0000 mg | Freq: Once | INTRAMUSCULAR | Status: AC
  Administered 2015-09-04: 25 mg via INTRAVENOUS
  Filled 2015-09-04: qty 1

## 2015-09-04 MED ORDER — SODIUM CHLORIDE 0.9 % IV SOLN
Freq: Once | INTRAVENOUS | Status: AC
  Administered 2015-09-04: 12:00:00 via INTRAVENOUS
  Filled 2015-09-04: qty 4

## 2015-09-04 MED ORDER — SODIUM CHLORIDE 0.9 % IV SOLN: 2.0000 g | INTRAVENOUS | Status: DC | PRN

## 2015-09-04 MED ORDER — SODIUM CHLORIDE 0.9% FLUSH
10.0000 mL | INTRAVENOUS | Status: DC | PRN
  Administered 2015-09-04: 10 mL
  Filled 2015-09-04: qty 10

## 2015-09-04 MED ORDER — PACLITAXEL CHEMO INJECTION 300 MG/50ML
80.0000 mg/m2 | Freq: Once | INTRAVENOUS | Status: AC
  Administered 2015-09-04: 168 mg via INTRAVENOUS
  Filled 2015-09-04: qty 28

## 2015-09-04 MED ORDER — SODIUM CHLORIDE 0.9 % IV SOLN: Freq: Once | INTRAVENOUS | Status: DC

## 2015-09-04 MED ORDER — FAMOTIDINE IN NACL 20-0.9 MG/50ML-% IV SOLN
20.0000 mg | Freq: Once | INTRAVENOUS | Status: AC
  Administered 2015-09-04: 20 mg via INTRAVENOUS
  Filled 2015-09-04: qty 50

## 2015-09-09 ENCOUNTER — Other Ambulatory Visit: Payer: Self-pay | Admitting: *Deleted

## 2015-09-09 MED ORDER — LISINOPRIL 5 MG PO TABS: 5.0000 mg | ORAL_TABLET | Freq: Every day | ORAL | 2 refills | Status: DC

## 2015-09-09 MED ORDER — OMEPRAZOLE 20 MG PO CPDR: 20.0000 mg | DELAYED_RELEASE_CAPSULE | Freq: Every day | ORAL | 2 refills | Status: DC

## 2015-09-09 MED ORDER — ROPINIROLE HCL 1 MG PO TABS: ORAL_TABLET | ORAL | 2 refills | Status: DC

## 2015-09-09 MED ORDER — ATORVASTATIN CALCIUM 10 MG PO TABS: 10.0000 mg | ORAL_TABLET | Freq: Every day | ORAL | 2 refills | Status: DC

## 2015-09-11 ENCOUNTER — Inpatient Hospital Stay: Payer: Medicare Other

## 2015-09-11 ENCOUNTER — Inpatient Hospital Stay (HOSPITAL_BASED_OUTPATIENT_CLINIC_OR_DEPARTMENT_OTHER): Payer: Medicare Other | Admitting: Internal Medicine

## 2015-09-11 VITALS — BP 131/71 | HR 91 | Temp 97.2°F | Resp 18 | Wt 210.0 lb

## 2015-09-11 DIAGNOSIS — D509 Iron deficiency anemia, unspecified: Secondary | ICD-10-CM

## 2015-09-11 DIAGNOSIS — F419 Anxiety disorder, unspecified: Secondary | ICD-10-CM | POA: Diagnosis not present

## 2015-09-11 DIAGNOSIS — D709 Neutropenia, unspecified: Secondary | ICD-10-CM

## 2015-09-11 DIAGNOSIS — C50412 Malignant neoplasm of upper-outer quadrant of left female breast: Secondary | ICD-10-CM

## 2015-09-11 DIAGNOSIS — E538 Deficiency of other specified B group vitamins: Secondary | ICD-10-CM

## 2015-09-11 DIAGNOSIS — E785 Hyperlipidemia, unspecified: Secondary | ICD-10-CM

## 2015-09-11 DIAGNOSIS — Z808 Family history of malignant neoplasm of other organs or systems: Secondary | ICD-10-CM

## 2015-09-11 DIAGNOSIS — K3184 Gastroparesis: Secondary | ICD-10-CM

## 2015-09-11 DIAGNOSIS — Z85819 Personal history of malignant neoplasm of unspecified site of lip, oral cavity, and pharynx: Secondary | ICD-10-CM

## 2015-09-11 DIAGNOSIS — E119 Type 2 diabetes mellitus without complications: Secondary | ICD-10-CM

## 2015-09-11 DIAGNOSIS — K219 Gastro-esophageal reflux disease without esophagitis: Secondary | ICD-10-CM

## 2015-09-11 DIAGNOSIS — B029 Zoster without complications: Secondary | ICD-10-CM

## 2015-09-11 DIAGNOSIS — I10 Essential (primary) hypertension: Secondary | ICD-10-CM

## 2015-09-11 DIAGNOSIS — Z5111 Encounter for antineoplastic chemotherapy: Secondary | ICD-10-CM | POA: Diagnosis not present

## 2015-09-11 DIAGNOSIS — Z923 Personal history of irradiation: Secondary | ICD-10-CM

## 2015-09-11 DIAGNOSIS — Z171 Estrogen receptor negative status [ER-]: Secondary | ICD-10-CM | POA: Diagnosis not present

## 2015-09-11 DIAGNOSIS — G629 Polyneuropathy, unspecified: Secondary | ICD-10-CM

## 2015-09-11 DIAGNOSIS — K5790 Diverticulosis of intestine, part unspecified, without perforation or abscess without bleeding: Secondary | ICD-10-CM

## 2015-09-11 DIAGNOSIS — Z87891 Personal history of nicotine dependence: Secondary | ICD-10-CM

## 2015-09-11 DIAGNOSIS — Z801 Family history of malignant neoplasm of trachea, bronchus and lung: Secondary | ICD-10-CM

## 2015-09-11 LAB — CBC WITH DIFFERENTIAL/PLATELET
BASOS ABS: 0 10*3/uL (ref 0–0.1)
Eosinophils Absolute: 0 10*3/uL (ref 0–0.7)
Eosinophils Relative: 3 %
HEMATOCRIT: 25.9 % — AB (ref 35.0–47.0)
HEMOGLOBIN: 8.9 g/dL — AB (ref 12.0–16.0)
Lymphocytes Relative: 26 %
Lymphs Abs: 0.3 10*3/uL — ABNORMAL LOW (ref 1.0–3.6)
MCH: 31.6 pg (ref 26.0–34.0)
MCHC: 34.4 g/dL (ref 32.0–36.0)
MCV: 91.7 fL (ref 80.0–100.0)
MONO ABS: 0.1 10*3/uL — AB (ref 0.2–0.9)
Monocytes Relative: 8 %
NEUTROS ABS: 0.8 10*3/uL — AB (ref 1.4–6.5)
Platelets: 134 10*3/uL — ABNORMAL LOW (ref 150–440)
RBC: 2.82 MIL/uL — AB (ref 3.80–5.20)
RDW: 16.1 % — AB (ref 11.5–14.5)
WBC: 1.3 10*3/uL — AB (ref 3.6–11.0)

## 2015-09-11 LAB — COMPREHENSIVE METABOLIC PANEL
ALBUMIN: 3.5 g/dL (ref 3.5–5.0)
ALK PHOS: 72 U/L (ref 38–126)
ALT: 28 U/L (ref 14–54)
ANION GAP: 5 (ref 5–15)
AST: 35 U/L (ref 15–41)
BUN: 15 mg/dL (ref 6–20)
CO2: 24 mmol/L (ref 22–32)
Calcium: 8.7 mg/dL — ABNORMAL LOW (ref 8.9–10.3)
Chloride: 106 mmol/L (ref 101–111)
Creatinine, Ser: 0.62 mg/dL (ref 0.44–1.00)
GFR calc Af Amer: >60 mL/min (ref >60)
GFR calc non Af Amer: >60 mL/min (ref >60)
GLUCOSE: 132 mg/dL — AB (ref 65–99)
POTASSIUM: 3.6 mmol/L (ref 3.5–5.1)
SODIUM: 135 mmol/L (ref 135–145)
TOTAL PROTEIN: 6.5 g/dL (ref 6.5–8.1)
Total Bilirubin: 0.4 mg/dL (ref 0.3–1.2)

## 2015-09-11 LAB — MAGNESIUM: Magnesium: 1.3 mg/dL — ABNORMAL LOW (ref 1.7–2.4)

## 2015-09-11 MED ORDER — SODIUM CHLORIDE 0.9 % IV SOLN: 2.0000 g | INTRAVENOUS | Status: DC | PRN

## 2015-09-11 MED ORDER — SODIUM CHLORIDE 0.9 % IV SOLN
INTRAVENOUS | Status: DC
  Administered 2015-09-11: 10:00:00 via INTRAVENOUS
  Filled 2015-09-11: qty 1000

## 2015-09-11 MED ORDER — SODIUM CHLORIDE 0.9% FLUSH
10.0000 mL | INTRAVENOUS | Status: DC | PRN
  Administered 2015-09-11: 10 mL via INTRAVENOUS
  Filled 2015-09-11: qty 10

## 2015-09-11 MED ORDER — MAGNESIUM SULFATE 2 GM/50ML IV SOLN
2.0000 g | Freq: Once | INTRAVENOUS | Status: AC
  Administered 2015-09-11: 2 g via INTRAVENOUS
  Filled 2015-09-11: qty 50

## 2015-09-11 MED ORDER — HEPARIN SOD (PORK) LOCK FLUSH 100 UNIT/ML IV SOLN
500.0000 [IU] | Freq: Once | INTRAVENOUS | Status: AC
  Administered 2015-09-11: 500 [IU] via INTRAVENOUS
  Filled 2015-09-11: qty 5

## 2015-09-11 NOTE — Assessment & Plan Note (Signed)
Currently on neoadjuvant chemotherapy; excellent response. s/p 4 cycles day-8  of carbo-taxol.   # HOLD chemo cycle # 4- D-15 carbotaxol today; sec to neutropenia [ANC- 800]  # PN- G-1; go up to 300 TID.  # Hypomagnesemia- 1.3; plan mag today.   Check weekly labs/ follow up on aug 14th. Surgery planned on Aug 18th; discussed with Dr.Byrnett.

## 2015-09-11 NOTE — Progress Notes (Signed)
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 5 CM TUMOR MASS BIOPSIES POSITIVE FOR INVASIVE MAMMARY CARCINOMA TRIPLE NEGATIVE DISEASE(jANUARY, 2017)stage IIIa. 3. MUGA scan of the heart shows ejection fraction to be 61% (January, 2017) 4  4. Patient will start chemotherapy with Cytoxan and Adriamycin from March 18, 2015     Cancer of left breast [facility])   03/01/2015 Initial Diagnosis    Cancer of left breast ([place])      Malignant neoplasm of upper-outer quadrant of left female breast ([place])   03/11/2015 Initial Diagnosis    Malignant neoplasm of upper-outer quadrant of left female breast ([place])      INTERVAL HISTORY:  Suzanne Strong 70 y.o.  female pleasant patient above history of stage IIIa breast cancer currently on neoadjuvant chemotherapy status post Adriamycin Cytoxan; currently on carbotaxol is here for follow-up.   Patient complains of mild tingling and numbness of her extremities-patient is on Neurontin 300 mg twice a day. Right improvement noted. Otherwise no significant nausea or vomiting. No chest pain or shortness of breath or cough. Denies any new lumps or bumps.  REVIEW OF SYSTEMS:  A complete 10 point review of system is done which is negative except mentioned above/history of present illness.   PAST MEDICAL HISTORY :  Past Medical History:  Diagnosis Date  . Anemia    iron deficiency and B12 deficiency  .  Anxiety   . Breast cancer ([place]) 02/25/2015   upper-outer quadrant of left female breast, ER, PR, HER-2 Negative  . Cervical dysplasia    conization  . Diabetes mellitus without complication ([place])   . Diverticulosis   . Fever 04/11/2015  . Gastroparesis    related to previous radiation tx  . GERD (gastroesophageal reflux disease)   . Hyperlipidemia   . Hypertension   . Shingles    Hx of  . 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 APPENDECTOMY    . BREAST BIOPSY Left 02/25/2015   path pending  . BREAST CYST ASPIRATION    . BREAST SURGERY     breast biopsy benign  . CHOLECYSTECTOMY    . ESOPHAGOGASTRODUODENOSCOPY  07/2009   normal with few gastric polyps  . MOLE REMOVAL    . PORT-A-CATH REMOVAL    . PORTACATH PLACEMENT    . PORTACATH PLACEMENT Right 03/12/2015   Procedure: INSERTION PORT-A-CATH;  Surgeon: [name], MD;  Location: [facility] ORS;  Service: General;  Laterality: Right;  . RADICAL NECK DISSECTION    . TONSILLECTOMY     cancer treated with chemo and radiation    FAMILY HISTORY :   Family History  Problem Relation Age of Onset  . Osteoporosis Mother   . Hyperlipidemia Mother   . Depression Mother   . Cancer Mother     skin cancer ? basal cell and lung ca heavy smoker  . Alcohol  abuse Father   . Cancer Father     skin CA ? basal cell  . Heart disease Father     CHF  . Hyperlipidemia Brother   . Hypertension Brother     SOCIAL HISTORY:   Social History  Substance Use Topics  . Smoking status: Former Smoker    Packs/day: 0.50    Years: 6.00    Types: Cigarettes    Quit date: 03/10/1984  . Smokeless tobacco: Never Used  . Alcohol use No    ALLERGIES:  is allergic to amoxicillin and fluconazole.  MEDICATIONS:  Current Outpatient Prescriptions  Medication Sig Dispense Refill  . ALPRAZolam (XANAX) 0.5 MG tablet Take 1 tablet (0.5 mg total) by mouth at bedtime as needed for  sleep. 30 tablet 3  . atorvastatin (LIPITOR) 10 MG tablet Take 1 tablet (10 mg total) by mouth daily. 30 tablet 2  . b complex vitamins tablet Take 1 tablet by mouth daily.      . cyanocobalamin (,VITAMIN B-12,) 1000 MCG/ML injection Inject 1,000 mcg into the muscle every 30 (thirty) days.      . ferrous sulfate 325 (65 FE) MG tablet Take 325 mg by mouth daily with breakfast. Reported on 08/27/2015    . fluticasone (FLONASE) 50 MCG/ACT nasal spray instill 2 sprays into each nostril once daily 16 g 5  . gabapentin (NEURONTIN) 100 MG capsule Take 2 capsules (200 mg total) by mouth 2 (two) times daily. 120 capsule 3  . glipiZIDE (GLUCOTROL) 10 MG tablet Take 20 mg by mouth daily before breakfast. Take 2 tabs daily on the day of chemotherapy and the day after chemo    . JANUMET XR 50-1000 MG TB24 Take 2 tablets by mouth daily.  0  . lidocaine-prilocaine (EMLA) cream APPLY TO AFFECTED AREA ONCE A DAY  0  . lisinopril (PRINIVIL,ZESTRIL) 5 MG tablet Take 1 tablet (5 mg total) by mouth daily. 30 tablet 2  . magnesium chloride (SLOW-MAG) 64 MG TBEC SR tablet Take 1 tablet (64 mg total) by mouth daily. 30 tablet 2  . mirtazapine (REMERON) 30 MG tablet take 2 tablet by mouth at bedtime  0  . omeprazole (PRILOSEC) 20 MG capsule Take 1 capsule (20 mg total) by mouth daily. 30 capsule 2  . ondansetron (ZOFRAN) 8 MG tablet take 1 tablet by mouth twice a day (START ON THE 3RD DAY AFTER CHEMOTHERAPY) 30 tablet 1  . ONE TOUCH ULTRA TEST test strip   0  . potassium chloride (K-DUR) 10 MEQ tablet Take 1 tablet (10 mEq total) by mouth daily. 30 tablet 2  . rOPINIRole (REQUIP) 1 MG tablet 1 pill in the am as needed by mouth and 3 pills at bedtime by mouth 120 tablet 2  . sertraline (ZOLOFT) 100 MG tablet Take 200 mg by mouth daily. Reported on 08/27/2015  0   No current facility-administered medications for this visit.    Facility-Administered Medications Ordered in Other Visits  Medication Dose Route Frequency  Provider Last Rate Last Dose  . sodium chloride flush (NS) 0.9 % injection 10 mL  10 mL Intravenous PRN Cammie Sickle, MD   10 mL at 09/11/15 0845    PHYSICAL EXAMINATION: ECOG PERFORMANCE STATUS: 0 - Asymptomatic  BP 131/71 (BP Location: Left Arm, Patient Position: Sitting)   Pulse 91   Temp 97.2 F (36.2 C) (Tympanic)   Resp 18   Wt 210 lb (95.3 kg)   BMI 33.89 kg/m   [employer]  09/11/15 0904  Weight: 210 lb (95.3 kg)    GENERAL: Well-nourished well-developed; Alert, no distress and comfortable.   Alone. EYES: no pallor or icterus OROPHARYNX: no thrush or ulceration; good dentition  NECK: supple, no masses felt LYMPH:  no palpable lymphadenopathy in the cervical, axillary or inguinal regions LUNGS: clear to auscultation and  No wheeze or crackles HEART/CVS: regular rate & rhythm and no murmurs; No lower extremity edema ABDOMEN:abdomen soft, non-tender and normal bowel sounds Musculoskeletal:no cyanosis of digits and no clubbing  PSYCH: alert & oriented x 3 with fluent speech NEURO: no focal motor/sensory deficits SKIN:  no rashes or significant lesions  LABORATORY DATA:  I have reviewed the data as listed    Component Value Date/Time   NA 135 09/11/2015 0845   NA 142 03/01/2014 0846   K 3.6 09/11/2015 0845   K 4.3 03/01/2014 0846   CL 106 09/11/2015 0845   CL 102 03/01/2014 0846   CO2 24 09/11/2015 0845   CO2 28 03/01/2014 0846   GLUCOSE 132 (H) 09/11/2015 0845   GLUCOSE 219 (H) 03/01/2014 0846   BUN 15 09/11/2015 0845   BUN 12 03/01/2014 0846   CREATININE 0.62 09/11/2015 0845   CREATININE 0.95 03/01/2014 0846   CALCIUM 8.7 (L) 09/11/2015 0845   CALCIUM 8.6 03/01/2014 0846   PROT 6.5 09/11/2015 0845   PROT 7.5 03/01/2014 0846   ALBUMIN 3.5 09/11/2015 0845   ALBUMIN 3.4 03/01/2014 0846   AST 35 09/11/2015 0845   AST 20 03/01/2014 0846   ALT 28 09/11/2015 0845   ALT 28 03/01/2014 0846   ALKPHOS 72 09/11/2015 0845   ALKPHOS 122 (H) 03/01/2014  0846   BILITOT 0.4 09/11/2015 0845   BILITOT 0.2 03/01/2014 0846   GFRNONAA >60 09/11/2015 0845   GFRNONAA >60 03/01/2014 0846   GFRNONAA >60 08/24/2013 1015   GFRAA >60 09/11/2015 0845   GFRAA >60 03/01/2014 0846   GFRAA >60 08/24/2013 1015    No results found for: SPEP, UPEP  Lab Results  Component Value Date   WBC 1.3 (LL) 09/11/2015   NEUTROABS 0.8 (L) 09/11/2015   HGB 8.9 (L) 09/11/2015   HCT 25.9 (L) 09/11/2015   MCV 91.7 09/11/2015   PLT 134 (L) 09/11/2015      Chemistry      Component Value Date/Time   NA 135 09/11/2015 0845   NA 142 03/01/2014 0846   K 3.6 09/11/2015 0845   K 4.3 03/01/2014 0846   CL 106 09/11/2015 0845   CL 102 03/01/2014 0846   CO2 24 09/11/2015 0845   CO2 28 03/01/2014 0846   BUN 15 09/11/2015 0845   BUN 12 03/01/2014 0846   CREATININE 0.62 09/11/2015 0845   CREATININE 0.95 03/01/2014 0846      Component Value Date/Time   CALCIUM 8.7 (L) 09/11/2015 0845   CALCIUM 8.6 03/01/2014 0846   ALKPHOS 72 09/11/2015 0845   ALKPHOS 122 (H) 03/01/2014 0846   AST 35 09/11/2015 0845   AST 20 03/01/2014 0846   ALT 28 09/11/2015 0845   ALT 28 03/01/2014 0846   BILITOT 0.4 09/11/2015 0845   BILITOT 0.2 03/01/2014 0846       RADIOGRAPHIC STUDIES: I have personally reviewed the radiological images as listed and agreed with the findings in the report. No results found.   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 s/p 4 cycles day-8  of carbo-taxol.   #  HOLD chemo cycle # 4- D-15 carbotaxol today; sec to neutropenia [ANC- 800]  # PN- G-1; go up to 300 TID.  # Hypomagnesemia- 1.3; plan mag today.   Check weekly labs/ follow up on aug 14th. Surgery planned on Aug 18th; discussed with Dr.Byrnett.    Orders Placed This Encounter  Procedures  . CBC with Differential    Standing Status:   Future    Standing Expiration Date:   09/10/2016   . Basic metabolic panel    Standing Status:   Future    Standing Expiration Date:   09/10/2016  . Magnesium    Standing Status:   Future    Standing Expiration Date:   09/10/2016   All questions were answered. The patient knows to call the clinic with any problems, questions or concerns.      Cammie Sickle, MD 09/11/2015 5:31 PM

## 2015-09-14 ENCOUNTER — Other Ambulatory Visit: Payer: Self-pay | Admitting: Family Medicine

## 2015-09-19 ENCOUNTER — Telehealth: Payer: Self-pay | Admitting: Family Medicine

## 2015-09-19 NOTE — Telephone Encounter (Signed)
LM for pt to sch CPE and AWV, mn °

## 2015-09-22 DIAGNOSIS — F419 Anxiety disorder, unspecified: Secondary | ICD-10-CM | POA: Diagnosis not present

## 2015-09-22 DIAGNOSIS — G4700 Insomnia, unspecified: Secondary | ICD-10-CM | POA: Diagnosis not present

## 2015-09-22 DIAGNOSIS — G4730 Sleep apnea, unspecified: Secondary | ICD-10-CM | POA: Diagnosis not present

## 2015-09-23 ENCOUNTER — Encounter
Admission: RE | Admit: 2015-09-23 | Discharge: 2015-09-23 | Disposition: A | Discharge status: Home / Self Care | Payer: Medicare Other | Source: Ambulatory Visit | Attending: General Surgery | Admitting: General Surgery

## 2015-09-23 DIAGNOSIS — Z01818 Encounter for other preprocedural examination: Secondary | ICD-10-CM | POA: Insufficient documentation

## 2015-09-23 NOTE — Patient Instructions (Addendum)
  Your procedure is scheduled on: 10-02-15 [facility]) Report to [place] @ 8 AM   Remember: Instructions that are not followed completely may result in serious medical risk, up to and including death, or upon the discretion of your surgeon and anesthesiologist your surgery may need to be rescheduled.    _x___ 1. Do not eat food or drink liquids after midnight. No gum chewing or hard candies.     __x__ 2. No Alcohol for 24 hours before or after surgery.   __x__3. No Smoking for 24 prior to surgery.   ____  4. Bring all medications with you on the day of surgery if instructed.    __x__ 5. Notify your doctor if there is any change in your medical condition     (cold, fever, infections).     Do not wear jewelry, make-up, hairpins, clips or nail polish.  Do not wear lotions, powders, or perfumes. You may wear deodorant.  Do not shave 48 hours prior to surgery. Men may shave face and neck.  Do not bring valuables to the hospital.    [facility] is not responsible for any belongings or valuables.               Contacts, dentures or bridgework may not be worn into surgery.  Leave your suitcase in the car. After surgery it may be brought to your room.  For patients admitted to the hospital, discharge time is determined by your treatment team.   Patients discharged the day of surgery will not be allowed to drive home.    Please read over the following fact sheets that you were given:   [facility] Preparing for Surgery and or MRSA Information   __X__ Take these medicines the morning of surgery with A SIP OF WATER:    1. LIPITOR (ATORVASTATIN)  2. ZOLOFT (SERTRALINE)  3. GABAPENTIN (NEURONTIN)  4. MAGNESIUM  5. REQUIP (ROPINOROLE)  6. PRILOSEC (OMEPRAZOLE)  ____ Fleet Enema (as directed)   _x___ Use CHG Soap or sage wipes as directed on instruction sheet   ____ Use inhalers on the day of surgery and bring to hospital day of surgery  _X___ Stop metformin 2 days prior  to surgery-LAST DOSE OF  JANUMET ON 09-29-15 (SUNDAY)    ____ Take 1/2 of usual insulin dose the night before surgery and none on the morning of surgery.   ____ Stop aspirin or coumadin, or plavix  ___ Stop Anti-inflammatories such as Advil, Aleve, Ibuprofen, Motrin, Naproxen,          Naprosyn, Goodies powders or aspirin products. Ok to take Tylenol.   _X___ Stop supplements until after surgery-STOP B COMPLEX 7 DAYS PRIOR TO SURGERY  ____ Bring C-Pap to the hospital.

## 2015-09-27 DIAGNOSIS — F411 Generalized anxiety disorder: Secondary | ICD-10-CM | POA: Diagnosis not present

## 2015-09-30 ENCOUNTER — Inpatient Hospital Stay: Payer: Medicare Other

## 2015-09-30 ENCOUNTER — Inpatient Hospital Stay: Payer: Medicare Other | Attending: Internal Medicine | Admitting: Internal Medicine

## 2015-09-30 VITALS — BP 116/73 | HR 89 | Temp 98.0°F | Resp 18 | Wt 208.5 lb

## 2015-09-30 DIAGNOSIS — Z801 Family history of malignant neoplasm of trachea, bronchus and lung: Secondary | ICD-10-CM | POA: Insufficient documentation

## 2015-09-30 DIAGNOSIS — Z923 Personal history of irradiation: Secondary | ICD-10-CM | POA: Insufficient documentation

## 2015-09-30 DIAGNOSIS — F419 Anxiety disorder, unspecified: Secondary | ICD-10-CM

## 2015-09-30 DIAGNOSIS — E785 Hyperlipidemia, unspecified: Secondary | ICD-10-CM | POA: Diagnosis not present

## 2015-09-30 DIAGNOSIS — Z808 Family history of malignant neoplasm of other organs or systems: Secondary | ICD-10-CM | POA: Diagnosis not present

## 2015-09-30 DIAGNOSIS — D509 Iron deficiency anemia, unspecified: Secondary | ICD-10-CM | POA: Diagnosis not present

## 2015-09-30 DIAGNOSIS — Z79899 Other long term (current) drug therapy: Secondary | ICD-10-CM | POA: Insufficient documentation

## 2015-09-30 DIAGNOSIS — Z85818 Personal history of malignant neoplasm of other sites of lip, oral cavity, and pharynx: Secondary | ICD-10-CM | POA: Insufficient documentation

## 2015-09-30 DIAGNOSIS — Z171 Estrogen receptor negative status [ER-]: Secondary | ICD-10-CM | POA: Diagnosis not present

## 2015-09-30 DIAGNOSIS — Z87891 Personal history of nicotine dependence: Secondary | ICD-10-CM | POA: Insufficient documentation

## 2015-09-30 DIAGNOSIS — D709 Neutropenia, unspecified: Secondary | ICD-10-CM | POA: Insufficient documentation

## 2015-09-30 DIAGNOSIS — Z85828 Personal history of other malignant neoplasm of skin: Secondary | ICD-10-CM

## 2015-09-30 DIAGNOSIS — Z794 Long term (current) use of insulin: Secondary | ICD-10-CM

## 2015-09-30 DIAGNOSIS — Z7984 Long term (current) use of oral hypoglycemic drugs: Secondary | ICD-10-CM | POA: Diagnosis not present

## 2015-09-30 DIAGNOSIS — K219 Gastro-esophageal reflux disease without esophagitis: Secondary | ICD-10-CM

## 2015-09-30 DIAGNOSIS — K3184 Gastroparesis: Secondary | ICD-10-CM | POA: Diagnosis not present

## 2015-09-30 DIAGNOSIS — K5790 Diverticulosis of intestine, part unspecified, without perforation or abscess without bleeding: Secondary | ICD-10-CM

## 2015-09-30 DIAGNOSIS — N879 Dysplasia of cervix uteri, unspecified: Secondary | ICD-10-CM

## 2015-09-30 DIAGNOSIS — I10 Essential (primary) hypertension: Secondary | ICD-10-CM | POA: Diagnosis not present

## 2015-09-30 DIAGNOSIS — C50412 Malignant neoplasm of upper-outer quadrant of left female breast: Secondary | ICD-10-CM

## 2015-09-30 DIAGNOSIS — E119 Type 2 diabetes mellitus without complications: Secondary | ICD-10-CM

## 2015-09-30 DIAGNOSIS — Z9221 Personal history of antineoplastic chemotherapy: Secondary | ICD-10-CM | POA: Diagnosis not present

## 2015-09-30 DIAGNOSIS — E538 Deficiency of other specified B group vitamins: Secondary | ICD-10-CM | POA: Insufficient documentation

## 2015-09-30 LAB — BASIC METABOLIC PANEL
ANION GAP: 6 (ref 5–15)
BUN: 16 mg/dL (ref 6–20)
CALCIUM: 9.2 mg/dL (ref 8.9–10.3)
CO2: 25 mmol/L (ref 22–32)
Chloride: 107 mmol/L (ref 101–111)
Creatinine, Ser: 0.72 mg/dL (ref 0.44–1.00)
GLUCOSE: 71 mg/dL (ref 65–99)
Potassium: 4.6 mmol/L (ref 3.5–5.1)
Sodium: 138 mmol/L (ref 135–145)

## 2015-09-30 LAB — MAGNESIUM: Magnesium: 1.6 mg/dL — ABNORMAL LOW (ref 1.7–2.4)

## 2015-09-30 LAB — CBC WITH DIFFERENTIAL/PLATELET
BASOS ABS: 0 10*3/uL (ref 0–0.1)
BASOS PCT: 0 %
EOS PCT: 1 %
Eosinophils Absolute: 0 10*3/uL (ref 0–0.7)
HCT: 30.5 % — ABNORMAL LOW (ref 35.0–47.0)
Hemoglobin: 10.4 g/dL — ABNORMAL LOW (ref 12.0–16.0)
Lymphocytes Relative: 15 %
Lymphs Abs: 0.7 10*3/uL — ABNORMAL LOW (ref 1.0–3.6)
MCH: 31.7 pg (ref 26.0–34.0)
MCHC: 34.3 g/dL (ref 32.0–36.0)
MCV: 92.6 fL (ref 80.0–100.0)
MONO ABS: 0.4 10*3/uL (ref 0.2–0.9)
Monocytes Relative: 10 %
Neutro Abs: 3.2 10*3/uL (ref 1.4–6.5)
Neutrophils Relative %: 74 %
PLATELETS: 244 10*3/uL (ref 150–440)
RBC: 3.29 MIL/uL — ABNORMAL LOW (ref 3.80–5.20)
RDW: 17 % — AB (ref 11.5–14.5)
WBC: 4.3 10*3/uL (ref 3.6–11.0)

## 2015-09-30 NOTE — Progress Notes (Signed)
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 5 CM TUMOR MASS BIOPSIES POSITIVE FOR INVASIVE MAMMARY CARCINOMA TRIPLE NEGATIVE DISEASE(jANUARY, 2017)stage IIIa. 3. MUGA scan of the heart shows ejection fraction to be 61% (January, 2017) 4  4. Patient will start chemotherapy with Cytoxan and Adriamycin from March 18, 2015     Cancer of left breast [facility])   03/01/2015 Initial Diagnosis    Cancer of left breast (Suzanne Strong)      Malignant neoplasm of upper-outer quadrant of left female breast (Suzanne Strong)   03/11/2015 Initial Diagnosis    Malignant neoplasm of upper-outer quadrant of left female breast (Suzanne Strong)     INTERVAL HISTORY:  Suzanne Strong 70 y.o.  female pleasant patient above history of stage IIIa breast cancer currently on neoadjuvant chemotherapy status post Adriamycin Cytoxan; currently on carbotaxol is here for follow-up.  Chemotherapy was held approximately 2 weeks ago because of neutropenia.  Patient continues to complain of mild tingling and numbness of her extremities-patient is on Neurontin 300 mg twice a day. No chest pain or shortness of breath or cough. Denies any new lumps or bumps.  REVIEW OF SYSTEMS:  A complete 10 point review of system is done which is negative except mentioned above/history of present illness.   PAST MEDICAL HISTORY :  Past Medical History:  Diagnosis Date  . Anemia    iron deficiency and B12  deficiency  . Anxiety   . Breast cancer (Suzanne Strong) 02/25/2015   upper-outer quadrant of left female breast, ER, PR, HER-2 Negative  . Cervical dysplasia    conization  . Diabetes mellitus without complication (Suzanne Strong)   . Diverticulosis   . Fever 04/11/2015  . Gastroparesis    related to previous radiation tx  . GERD (gastroesophageal reflux disease)   . Hyperlipidemia   . Hypertension   . Shingles    Hx of  . 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Suzanne Strong) 2006    PAST SURGICAL HISTORY :   Past Surgical History:  Procedure Laterality Date  . APPENDECTOMY    . BREAST BIOPSY Left 02/25/2015   path pending  . BREAST CYST ASPIRATION    . BREAST SURGERY     breast biopsy benign  . CHOLECYSTECTOMY    . ESOPHAGOGASTRODUODENOSCOPY  07/2009   normal with few gastric polyps  . MOLE REMOVAL    . Suzanne-A-CATH REMOVAL    . PORTACATH PLACEMENT    . PORTACATH PLACEMENT Right 03/12/2015   Procedure: INSERTION Suzanne-A-CATH;  Surgeon: [name], MD;  Location: [facility] ORS;  Service: General;  Laterality: Right;  . RADICAL NECK DISSECTION    . TONSILLECTOMY     cancer treated with chemo and radiation    FAMILY HISTORY :   Family History  Problem Relation Age of Onset  . Osteoporosis Mother   . Hyperlipidemia Mother   . Depression Mother   . Cancer Mother     skin cancer ? basal cell and lung ca heavy smoker  .  Alcohol abuse Father   . Cancer Father     skin CA ? basal cell  . Heart disease Father     CHF  . Hyperlipidemia Brother   . Hypertension Brother     SOCIAL HISTORY:   Social History  Substance Use Topics  . Smoking status: Former Smoker    Packs/day: 0.50    Years: 6.00    Types: Cigarettes    Quit date: 03/10/1984  . Smokeless tobacco: Never Used  . Alcohol use No    ALLERGIES:  is allergic to amoxicillin and fluconazole.  MEDICATIONS:  Current Outpatient Prescriptions  Medication Sig Dispense Refill  . ALPRAZolam (XANAX) 0.5 MG tablet Take 1 tablet (0.5 mg total) by mouth at bedtime as  needed for sleep. 30 tablet 3  . atorvastatin (LIPITOR) 10 MG tablet Take 1 tablet (10 mg total) by mouth daily. (Patient taking differently: Take 10 mg by mouth every morning. ) 30 tablet 2  . b complex vitamins tablet Take 1 tablet by mouth daily.      . cyanocobalamin (,VITAMIN B-12,) 1000 MCG/ML injection Inject 1,000 mcg into the muscle every 30 (thirty) days.      . ferrous sulfate 325 (65 FE) MG tablet Take 325 mg by mouth daily with breakfast. Reported on 08/27/2015    . fluticasone (FLONASE) 50 MCG/ACT nasal spray instill 2 sprays into each nostril once daily 16 g 5  . gabapentin (NEURONTIN) 100 MG capsule Take 2 capsules (200 mg total) by mouth 2 (two) times daily. (Patient taking differently: Take 300 mg by mouth 2 (two) times daily. ) 120 capsule 3  . glipiZIDE (GLUCOTROL) 10 MG tablet Take 20 mg by mouth daily before breakfast. Take 2 tabs daily on the day of chemotherapy and the day after chemo    . JANUMET XR 50-1000 MG TB24 Take 1 tablet by mouth 2 (two) times daily.   0  . lidocaine-prilocaine (EMLA) cream Apply 1 application topically as needed. Apply to Suzanne when going through Chemo    . lisinopril (PRINIVIL,ZESTRIL) 5 MG tablet Take 1 tablet (5 mg total) by mouth daily. (Patient taking differently: Take 5 mg by mouth at bedtime. ) 30 tablet 2  . magnesium chloride (SLOW-MAG) 64 MG TBEC SR tablet Take 1 tablet (64 mg total) by mouth daily. (Patient taking differently: Take 1 tablet by mouth every morning. ) 30 tablet 2  . mirtazapine (REMERON) 30 MG tablet take 2 tablet by mouth at bedtime  0  . omeprazole (PRILOSEC) 20 MG capsule take 1 capsule by mouth once daily (Patient taking differently: take 1 capsule by mouth once daily-bedtime) 30 capsule 2  . ondansetron (ZOFRAN) 8 MG tablet take 1 tablet by mouth twice a day (START ON THE 3RD DAY AFTER CHEMOTHERAPY) 30 tablet 1  . potassium chloride (K-DUR) 10 MEQ tablet Take 1 tablet (10 mEq total) by mouth daily. 30 tablet 2  .  rOPINIRole (REQUIP) 1 MG tablet 1 pill in the am as needed by mouth and 3 pills at bedtime by mouth (Patient taking differently: Take 1 mg by mouth See admin instructions. 1 pill in the am as needed by mouth and 3 pills at bedtime by mouth) 120 tablet 2  . sertraline (ZOLOFT) 100 MG tablet Take 200 mg by mouth every morning. Reported on 08/27/2015  0   No current facility-administered medications for this visit.    Facility-Administered Medications Ordered in Other Visits  Medication Dose Route Frequency Provider Last Rate Last  Dose  . sodium chloride flush (NS) 0.9 % injection 10 mL  10 mL Intravenous PRN Cammie Sickle, MD   10 mL at 09/11/15 0845    PHYSICAL EXAMINATION: ECOG PERFORMANCE STATUS: 0 - Asymptomatic  BP 116/73 (BP Location: Left Arm, Patient Position: Sitting)   Pulse 89   Temp 98 F (36.7 C) (Tympanic)   Resp 18   Wt 208 lb 8 oz (94.6 kg)   BMI 33.65 kg/m   Filed Weights   09/30/15 1520  Weight: 208 lb 8 oz (94.6 kg)    GENERAL: Well-nourished well-developed; Alert, no distress and comfortable.   Alone. EYES: no pallor or icterus OROPHARYNX: no thrush or ulceration; good dentition  NECK: supple, no masses felt LYMPH:  no palpable lymphadenopathy in the cervical, axillary or inguinal regions LUNGS: clear to auscultation and  No wheeze or crackles HEART/CVS: regular rate & rhythm and no murmurs; No lower extremity edema ABDOMEN:abdomen soft, non-tender and normal bowel sounds Musculoskeletal:no cyanosis of digits and no clubbing  PSYCH: alert & oriented x 3 with fluent speech NEURO: no focal motor/sensory deficits SKIN:  no rashes or significant lesions  LABORATORY DATA:  I have reviewed the data as listed    Component Value Date/Time   NA 138 09/30/2015 1451   NA 142 03/01/2014 0846   K 4.6 09/30/2015 1451   K 4.3 03/01/2014 0846   CL 107 09/30/2015 1451   CL 102 03/01/2014 0846   CO2 25 09/30/2015 1451   CO2 28 03/01/2014 0846   GLUCOSE 71  09/30/2015 1451   GLUCOSE 219 (H) 03/01/2014 0846   BUN 16 09/30/2015 1451   BUN 12 03/01/2014 0846   CREATININE 0.72 09/30/2015 1451   CREATININE 0.95 03/01/2014 0846   CALCIUM 9.2 09/30/2015 1451   CALCIUM 8.6 03/01/2014 0846   PROT 6.5 09/11/2015 0845   PROT 7.5 03/01/2014 0846   ALBUMIN 3.5 09/11/2015 0845   ALBUMIN 3.4 03/01/2014 0846   AST 35 09/11/2015 0845   AST 20 03/01/2014 0846   ALT 28 09/11/2015 0845   ALT 28 03/01/2014 0846   ALKPHOS 72 09/11/2015 0845   ALKPHOS 122 (H) 03/01/2014 0846   BILITOT 0.4 09/11/2015 0845   BILITOT 0.2 03/01/2014 0846   GFRNONAA >60 09/30/2015 1451   GFRNONAA >60 03/01/2014 0846   GFRNONAA >60 08/24/2013 1015   GFRAA >60 09/30/2015 1451   GFRAA >60 03/01/2014 0846   GFRAA >60 08/24/2013 1015    No results found for: SPEP, UPEP  Lab Results  Component Value Date   WBC 4.3 09/30/2015   NEUTROABS 3.2 09/30/2015   HGB 10.4 (L) 09/30/2015   HCT 30.5 (L) 09/30/2015   MCV 92.6 09/30/2015   PLT 244 09/30/2015      Chemistry      Component Value Date/Time   NA 138 09/30/2015 1451   NA 142 03/01/2014 0846   K 4.6 09/30/2015 1451   K 4.3 03/01/2014 0846   CL 107 09/30/2015 1451   CL 102 03/01/2014 0846   CO2 25 09/30/2015 1451   CO2 28 03/01/2014 0846   BUN 16 09/30/2015 1451   BUN 12 03/01/2014 0846   CREATININE 0.72 09/30/2015 1451   CREATININE 0.95 03/01/2014 0846      Component Value Date/Time   CALCIUM 9.2 09/30/2015 1451   CALCIUM 8.6 03/01/2014 0846   ALKPHOS 72 09/11/2015 0845   ALKPHOS 122 (H) 03/01/2014 0846   AST 35 09/11/2015 0845   AST 20 03/01/2014 0846  ALT 28 09/11/2015 0845   ALT 28 03/01/2014 0846   BILITOT 0.4 09/11/2015 0845   BILITOT 0.2 03/01/2014 0846       RADIOGRAPHIC STUDIES: I have personally reviewed the radiological images as listed and agreed with the findings in the report. No results found.   ASSESSMENT & PLAN:  Malignant neoplasm of upper-outer quadrant of left female breast  ([place]) Currently on neoadjuvant chemotherapy; excellent response. s/p 4 cycles day-8  of carbo-taxol. ;HOLD chemo cycle # 4- D-15 carbotaxol sec to neutropenia [ANC- 800] appx 2 weeks ago. Proceed with surgery as planned on August 18. Patient will likely need radiation based on the initial size of the tumor; however final Plan for radiation based upon final pathology.  # PN- G-1; go up to 300 TID.  # Hypomagnesemia- 1.6; on home supplementation.  #  Surgery planned on Aug 18th; discussed with Dr.Byrnett.  Follow up with me in appx 3 weeks post surgery/labs.   Orders Placed This Encounter  Procedures  . CBC with Differential    Standing Status:   Future    Standing Expiration Date:   10/23/2016  . Comprehensive metabolic panel    Standing Status:   Future    Standing Expiration Date:   10/23/2016   All questions were answered. The patient knows to call the clinic with any problems, questions or concerns.      Cammie Sickle, MD 09/30/2015 4:47 PM

## 2015-09-30 NOTE — Assessment & Plan Note (Addendum)
Currently on neoadjuvant chemotherapy; excellent response. s/p 4 cycles day-8  of carbo-taxol. ;HOLD chemo cycle # 4- D-15 carbotaxol sec to neutropenia [ANC- 800] appx 2 weeks ago. Proceed with surgery as planned on August 18. Patient will likely need radiation based on the initial size of the tumor; however final Plan for radiation based upon final pathology.  # PN- G-1; go up to 300 TID.  # Hypomagnesemia- 1.6; on home supplementation.  #  Surgery planned on Aug 18th; discussed with Dr.Byrnett.  Follow up with me in appx 3 weeks post surgery/labs.

## 2015-10-02 ENCOUNTER — Encounter: Payer: Self-pay | Admitting: *Deleted

## 2015-10-02 ENCOUNTER — Ambulatory Visit
Admission: RE | Admit: 2015-10-02 | Discharge: 2015-10-02 | Disposition: A | Discharge status: Home / Self Care | Payer: Medicare Other | Source: Ambulatory Visit | Attending: General Surgery | Admitting: General Surgery

## 2015-10-02 ENCOUNTER — Ambulatory Visit: Payer: Medicare Other | Admitting: Anesthesiology

## 2015-10-02 ENCOUNTER — Encounter
Admission: RE | Disposition: A | Discharge status: Home / Self Care | Payer: Self-pay | Source: Ambulatory Visit | Attending: General Surgery

## 2015-10-02 ENCOUNTER — Ambulatory Visit: Payer: Medicare Other

## 2015-10-02 DIAGNOSIS — C50412 Malignant neoplasm of upper-outer quadrant of left female breast: Secondary | ICD-10-CM | POA: Diagnosis not present

## 2015-10-02 DIAGNOSIS — Z880 Allergy status to penicillin: Secondary | ICD-10-CM | POA: Insufficient documentation

## 2015-10-02 DIAGNOSIS — Z79899 Other long term (current) drug therapy: Secondary | ICD-10-CM | POA: Diagnosis not present

## 2015-10-02 DIAGNOSIS — E785 Hyperlipidemia, unspecified: Secondary | ICD-10-CM | POA: Diagnosis not present

## 2015-10-02 DIAGNOSIS — E119 Type 2 diabetes mellitus without complications: Secondary | ICD-10-CM | POA: Insufficient documentation

## 2015-10-02 DIAGNOSIS — R928 Other abnormal and inconclusive findings on diagnostic imaging of breast: Secondary | ICD-10-CM | POA: Diagnosis not present

## 2015-10-02 DIAGNOSIS — Z87891 Personal history of nicotine dependence: Secondary | ICD-10-CM | POA: Diagnosis not present

## 2015-10-02 DIAGNOSIS — F419 Anxiety disorder, unspecified: Secondary | ICD-10-CM | POA: Diagnosis not present

## 2015-10-02 DIAGNOSIS — Z85818 Personal history of malignant neoplasm of other sites of lip, oral cavity, and pharynx: Secondary | ICD-10-CM | POA: Diagnosis not present

## 2015-10-02 DIAGNOSIS — Z8719 Personal history of other diseases of the digestive system: Secondary | ICD-10-CM | POA: Insufficient documentation

## 2015-10-02 DIAGNOSIS — K219 Gastro-esophageal reflux disease without esophagitis: Secondary | ICD-10-CM | POA: Insufficient documentation

## 2015-10-02 DIAGNOSIS — I10 Essential (primary) hypertension: Secondary | ICD-10-CM | POA: Diagnosis not present

## 2015-10-02 DIAGNOSIS — D649 Anemia, unspecified: Secondary | ICD-10-CM | POA: Insufficient documentation

## 2015-10-02 DIAGNOSIS — C50912 Malignant neoplasm of unspecified site of left female breast: Secondary | ICD-10-CM | POA: Diagnosis not present

## 2015-10-02 DIAGNOSIS — Z853 Personal history of malignant neoplasm of breast: Secondary | ICD-10-CM | POA: Diagnosis not present

## 2015-10-02 HISTORY — PX: BREAST LUMPECTOMY WITH SENTINEL LYMPH NODE BIOPSY: SHX5597

## 2015-10-02 HISTORY — PX: BREAST LUMPECTOMY WITH NEEDLE LOCALIZATION: SHX5759

## 2015-10-02 LAB — GLUCOSE, CAPILLARY: Glucose-Capillary: 74 mg/dL (ref 65–99)

## 2015-10-02 SURGERY — BREAST LUMPECTOMY WITH SENTINEL LYMPH NODE BX
Anesthesia: General | Laterality: Left | Wound class: Clean

## 2015-10-02 MED ORDER — METHYLENE BLUE 0.5 % INJ SOLN
INTRAVENOUS | Status: DC | PRN
  Administered 2015-10-02: 5 mL

## 2015-10-02 MED ORDER — ONDANSETRON HCL 4 MG/2ML IJ SOLN
INTRAMUSCULAR | Status: DC | PRN
  Administered 2015-10-02: 4 mg via INTRAVENOUS

## 2015-10-02 MED ORDER — LIDOCAINE HCL (CARDIAC) 20 MG/ML IV SOLN
INTRAVENOUS | Status: DC | PRN
  Administered 2015-10-02: 100 mg via INTRAVENOUS

## 2015-10-02 MED ORDER — ACETAMINOPHEN 10 MG/ML IV SOLN
INTRAVENOUS | Status: AC
  Filled 2015-10-02: qty 100

## 2015-10-02 MED ORDER — MIDAZOLAM HCL 2 MG/2ML IJ SOLN
INTRAMUSCULAR | Status: DC | PRN
  Administered 2015-10-02: 2 mg via INTRAVENOUS

## 2015-10-02 MED ORDER — ONDANSETRON HCL 4 MG/2ML IJ SOLN: 4.0000 mg | Freq: Once | INTRAMUSCULAR | Status: DC | PRN

## 2015-10-02 MED ORDER — TECHNETIUM TC 99M SULFUR COLLOID
1.0900 | Freq: Once | INTRAVENOUS | Status: AC | PRN
  Administered 2015-10-02: 1.09 via INTRAVENOUS

## 2015-10-02 MED ORDER — ACETAMINOPHEN 10 MG/ML IV SOLN
INTRAVENOUS | Status: DC | PRN
  Administered 2015-10-02: 1000 mg via INTRAVENOUS

## 2015-10-02 MED ORDER — FENTANYL CITRATE (PF) 100 MCG/2ML IJ SOLN
25.0000 ug | INTRAMUSCULAR | Status: DC | PRN
  Administered 2015-10-02 (×2): 25 ug via INTRAVENOUS

## 2015-10-02 MED ORDER — GLYCOPYRROLATE 0.2 MG/ML IJ SOLN
INTRAMUSCULAR | Status: DC | PRN
  Administered 2015-10-02: 0.2 mg via INTRAVENOUS

## 2015-10-02 MED ORDER — FENTANYL CITRATE (PF) 100 MCG/2ML IJ SOLN
INTRAMUSCULAR | Status: DC | PRN
  Administered 2015-10-02: 50 ug via INTRAVENOUS

## 2015-10-02 MED ORDER — HYDROCODONE-ACETAMINOPHEN 5-325 MG PO TABS: 1.0000 | ORAL_TABLET | ORAL | 0 refills | Status: DC | PRN

## 2015-10-02 MED ORDER — FENTANYL CITRATE (PF) 100 MCG/2ML IJ SOLN
INTRAMUSCULAR | Status: AC
  Administered 2015-10-02: 25 ug via INTRAVENOUS
  Filled 2015-10-02: qty 2

## 2015-10-02 MED ORDER — PROPOFOL 10 MG/ML IV BOLUS
INTRAVENOUS | Status: DC | PRN
  Administered 2015-10-02: 140 mg via INTRAVENOUS
  Administered 2015-10-02: 130 mg via INTRAVENOUS

## 2015-10-02 MED ORDER — KETOROLAC TROMETHAMINE 30 MG/ML IJ SOLN
INTRAMUSCULAR | Status: DC | PRN
  Administered 2015-10-02: 30 mg via INTRAVENOUS

## 2015-10-02 MED ORDER — PHENYLEPHRINE HCL 10 MG/ML IJ SOLN
INTRAMUSCULAR | Status: DC | PRN
  Administered 2015-10-02 (×5): 100 ug via INTRAVENOUS

## 2015-10-02 MED ORDER — BUPIVACAINE-EPINEPHRINE (PF) 0.5% -1:200000 IJ SOLN
INTRAMUSCULAR | Status: DC | PRN
  Administered 2015-10-02: 30 mL

## 2015-10-02 MED ORDER — EPHEDRINE SULFATE 50 MG/ML IJ SOLN
INTRAMUSCULAR | Status: DC | PRN
  Administered 2015-10-02: 5 mg via INTRAVENOUS
  Administered 2015-10-02 (×2): 10 mg via INTRAVENOUS

## 2015-10-02 MED ORDER — SODIUM CHLORIDE 0.9 % IV SOLN
INTRAVENOUS | Status: DC
  Administered 2015-10-02: 11:00:00 via INTRAVENOUS

## 2015-10-02 SURGICAL SUPPLY — 51 items
BANDAGE ELASTIC 6 LF NS (GAUZE/BANDAGES/DRESSINGS) IMPLANT
BLADE SURG 15 STRL SS SAFETY (BLADE) ×4 IMPLANT
BNDG GAUZE 4.5X4.1 6PLY STRL (MISCELLANEOUS) IMPLANT
BULB RESERV EVAC DRAIN JP 100C (MISCELLANEOUS) IMPLANT
CANISTER SUCT 1200ML W/VALVE (MISCELLANEOUS) ×2 IMPLANT
CHLORAPREP W/TINT 26ML (MISCELLANEOUS) ×2 IMPLANT
CNTNR SPEC 2.5X3XGRAD LEK (MISCELLANEOUS) ×1
CONT SPEC 4OZ STER OR WHT (MISCELLANEOUS) ×1
CONTAINER SPEC 2.5X3XGRAD LEK (MISCELLANEOUS) ×1 IMPLANT
COVER PROBE FLX POLY STRL (MISCELLANEOUS) ×2 IMPLANT
DEVICE DUBIN SPECIMEN MAMMOGRA (MISCELLANEOUS) ×2 IMPLANT
DRAIN CHANNEL JP 15F RND 16 (MISCELLANEOUS) IMPLANT
DRAPE LAPAROTOMY TRNSV 106X77 (MISCELLANEOUS) ×2 IMPLANT
DRSG TELFA 3X8 NADH (GAUZE/BANDAGES/DRESSINGS) ×4 IMPLANT
ELECT CAUTERY BLADE TIP 2.5 (TIP) ×2
ELECT REM PT RETURN 9FT ADLT (ELECTROSURGICAL) ×2
ELECTRODE CAUTERY BLDE TIP 2.5 (TIP) ×1 IMPLANT
ELECTRODE REM PT RTRN 9FT ADLT (ELECTROSURGICAL) ×1 IMPLANT
GAUZE FLUFF 18X24 1PLY STRL (GAUZE/BANDAGES/DRESSINGS) ×2 IMPLANT
GAUZE SPONGE 4X4 12PLY STRL (GAUZE/BANDAGES/DRESSINGS) IMPLANT
GLOVE BIO SURGEON STRL SZ7.5 (GLOVE) ×4 IMPLANT
GLOVE INDICATOR 8.0 STRL GRN (GLOVE) ×4 IMPLANT
GOWN STRL REUS W/ TWL LRG LVL3 (GOWN DISPOSABLE) ×2 IMPLANT
GOWN STRL REUS W/TWL LRG LVL3 (GOWN DISPOSABLE) ×4
HARMONIC SCALPEL FOCUS (MISCELLANEOUS) IMPLANT
KIT RM TURNOVER STRD PROC AR (KITS) ×2 IMPLANT
LABEL OR SOLS (LABEL) ×2 IMPLANT
MARGIN MAP 10MM (MISCELLANEOUS) ×2 IMPLANT
NDL SAFETY 22GX1.5 (NEEDLE) ×2 IMPLANT
NEEDLE HYPO 25X1 1.5 SAFETY (NEEDLE) ×2 IMPLANT
PACK BASIN MINOR ARMC (MISCELLANEOUS) ×2 IMPLANT
SHEARS FOC LG CVD HARMONIC 17C (MISCELLANEOUS) IMPLANT
SLEVE PROBE SENORX GAMMA FIND (MISCELLANEOUS) ×2 IMPLANT
STRIP CLOSURE SKIN 1/2X4 (GAUZE/BANDAGES/DRESSINGS) ×4 IMPLANT
SURGI-BRA LG (MISCELLANEOUS) ×2 IMPLANT
SUT ETHILON 3-0 FS-10 30 BLK (SUTURE) ×2
SUT SILK 2 0 (SUTURE) ×1
SUT SILK 2-0 18XBRD TIE 12 (SUTURE) ×1 IMPLANT
SUT VIC AB 2-0 CT1 27 (SUTURE) ×1
SUT VIC AB 2-0 CT1 TAPERPNT 27 (SUTURE) ×1 IMPLANT
SUT VIC AB 3-0 SH 27 (SUTURE) ×2
SUT VIC AB 3-0 SH 27X BRD (SUTURE) ×1 IMPLANT
SUT VIC AB 4-0 FS2 27 (SUTURE) ×2 IMPLANT
SUT VICRYL+ 3-0 144IN (SUTURE) ×2 IMPLANT
SUTURE EHLN 3-0 FS-10 30 BLK (SUTURE) ×1 IMPLANT
SWABSTK COMLB BENZOIN TINCTURE (MISCELLANEOUS) ×2 IMPLANT
SYR BULB IRRIG 60ML STRL (SYRINGE) ×2 IMPLANT
SYR CONTROL 10ML (SYRINGE) ×2 IMPLANT
SYRINGE 10CC LL (SYRINGE) ×2 IMPLANT
TAPE TRANSPORE STRL 2 31045 (GAUZE/BANDAGES/DRESSINGS) ×2 IMPLANT
WATER STERILE IRR 1000ML POUR (IV SOLUTION) ×2 IMPLANT

## 2015-10-02 NOTE — Discharge Instructions (Signed)
AMBULATORY SURGERY  °DISCHARGE INSTRUCTIONS ° ° °1) The drugs that you were given will stay in your system until tomorrow so for the next 24 hours you should not: ° °A) Drive an automobile °B) Make any legal decisions °C) Drink any alcoholic beverage ° ° °2) You may resume regular meals tomorrow.  Today it is better to start with liquids and gradually work up to solid foods. ° °You may eat anything you prefer, but it is better to start with liquids, then soup and crackers, and gradually work up to solid foods. ° ° °3) Please notify your doctor immediately if you have any unusual bleeding, trouble breathing, redness and pain at the surgery site, drainage, fever, or pain not relieved by medication. ° ° ° °4) Additional Instructions: ° ° ° ° ° ° ° °Please contact your physician with any problems or Same Day Surgery at [phone], Monday through Friday 6 am to 4 pm, or [place] at [facility] number at [phone].AMBULATORY SURGERY  °DISCHARGE INSTRUCTIONS ° ° °5) The drugs that you were given will stay in your system until tomorrow so for the next 24 hours you should not: ° °D) Drive an automobile °E) Make any legal decisions °F) Drink any alcoholic beverage ° ° °6) You may resume regular meals tomorrow.  Today it is better to start with liquids and gradually work up to solid foods. ° °You may eat anything you prefer, but it is better to start with liquids, then soup and crackers, and gradually work up to solid foods. ° ° °7) Please notify your doctor immediately if you have any unusual bleeding, trouble breathing, redness and pain at the surgery site, drainage, fever, or pain not relieved by medication. ° ° ° °8) Additional Instructions: ° ° ° ° ° ° ° °Please contact your physician with any problems or Same Day Surgery at [phone], Monday through Friday 6 am to 4 pm, or [place] at [facility] number at [phone]. °

## 2015-10-02 NOTE — Transfer of Care (Signed)
Immediate Anesthesia Transfer of Care Note  Patient: Suzanne Strong  Procedure(s) Performed: Procedure(s): LEFT BREAST WIDE EXCISION WITH SENTINEL LYMPH NODE BX (Left) BREAST LUMPECTOMY WITH NEEDLE LOCALIZATION (Left)  Patient Location: PACU  Anesthesia Type:General  Level of Consciousness: awake, alert , oriented and patient cooperative  Airway & Oxygen Therapy: Patient Spontanous Breathing and Patient connected to face mask oxygen  Post-op Assessment: Report given to RN, Post -op Vital signs reviewed and stable and Patient moving all extremities X 4  Post vital signs: Reviewed and stable  Last Vitals:  Vitals:   10/02/15 0951  BP: (!) 129/58  Pulse: 68  Resp: 18  Temp: (!) 35.7 C    Last Pain:  Vitals:   10/02/15 0951  TempSrc: Tympanic  PainSc: 0-No pain         Complications: No apparent anesthesia complications

## 2015-10-02 NOTE — H&P (Addendum)
Suzanne Strong [id number] 06-12-1945     HPI: 70 year old woman status post neoadjuvant chemotherapy for a T2 carcinoma the left breast. 4 wide excision with needle localization and sentinel node biopsy.  Prescriptions Prior to Admission  Medication Sig Dispense Refill Last Dose  . ALPRAZolam (XANAX) 0.5 MG tablet Take 1 tablet (0.5 mg total) by mouth at bedtime as needed for sleep. 30 tablet 3 10/01/2015 at Unknown time  . atorvastatin (LIPITOR) 10 MG tablet Take 1 tablet (10 mg total) by mouth daily. (Patient taking differently: Take 10 mg by mouth every morning. ) 30 tablet 2 10/02/2015 at 0630  . b complex vitamins tablet Take 1 tablet by mouth daily.     09/25/2015  . cyanocobalamin (,VITAMIN B-12,) 1000 MCG/ML injection Inject 1,000 mcg into the muscle every 30 (thirty) days.     09/25/2015  . ferrous sulfate 325 (65 FE) MG tablet Take 325 mg by mouth daily with breakfast. Reported on 08/27/2015   09/25/2015  . fluticasone (FLONASE) 50 MCG/ACT nasal spray instill 2 sprays into each nostril once daily 16 g 5 10/01/2015 at Unknown time  . gabapentin (NEURONTIN) 100 MG capsule Take 2 capsules (200 mg total) by mouth 2 (two) times daily. (Patient taking differently: Take 300 mg by mouth 2 (two) times daily. ) 120 capsule 3 10/02/2015 at 0630  . glipiZIDE (GLUCOTROL) 10 MG tablet Take 20 mg by mouth daily before breakfast. Take 2 tabs daily on the day of chemotherapy and the day after chemo   10/01/2015 at 2230  . JANUMET XR 50-1000 MG TB24 Take 1 tablet by mouth 2 (two) times daily.   0 09/29/2015  . lidocaine-prilocaine (EMLA) cream Apply 1 application topically as needed. Apply to port when going through Chemo   Taking  . lisinopril (PRINIVIL,ZESTRIL) 5 MG tablet Take 1 tablet (5 mg total) by mouth daily. (Patient taking differently: Take 5 mg by mouth at bedtime. ) 30 tablet 2 10/01/2015 at Unknown time  . magnesium chloride (SLOW-MAG) 64 MG TBEC SR tablet Take 1 tablet (64 mg total) by mouth daily.  (Patient taking differently: Take 1 tablet by mouth every morning. ) 30 tablet 2 10/02/2015 at 0630  . mirtazapine (REMERON) 30 MG tablet take 2 tablet by mouth at bedtime  0 10/01/2015 at Unknown time  . omeprazole (PRILOSEC) 20 MG capsule take 1 capsule by mouth once daily (Patient taking differently: take 1 capsule by mouth once daily-bedtime) 30 capsule 2 10/02/2015 at 0630  . ondansetron (ZOFRAN) 8 MG tablet take 1 tablet by mouth twice a day (START ON THE 3RD DAY AFTER CHEMOTHERAPY) 30 tablet 1 08/17/2015  . potassium chloride (K-DUR) 10 MEQ tablet Take 1 tablet (10 mEq total) by mouth daily. 30 tablet 2 10/01/2015 at Unknown time  . rOPINIRole (REQUIP) 1 MG tablet 1 pill in the am as needed by mouth and 3 pills at bedtime by mouth (Patient taking differently: Take 1 mg by mouth See admin instructions. 1 pill in the am as needed by mouth and 3 pills at bedtime by mouth) 120 tablet 2 10/02/2015 at 0630  . sertraline (ZOLOFT) 100 MG tablet Take 200 mg by mouth every morning. Reported on 08/27/2015  0 10/02/2015 at 0630   Allergies  Allergen Reactions  . Amoxicillin     REACTION: rash  . Fluconazole     REACTION: hives   Past Medical History:  Diagnosis Date  . Anemia    iron deficiency and B12 deficiency  . Anxiety   .  Breast cancer ([place]) 02/25/2015   upper-outer quadrant of left female breast, ER, PR, HER-2 Negative  . Cervical dysplasia    conization  . Diabetes mellitus without complication ([place])   . Diverticulosis   . Fever 04/11/2015  . Gastroparesis    related to previous radiation tx  . GERD (gastroesophageal reflux disease)   . Hyperlipidemia   . Hypertension   . Shingles    Hx of  . 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[place]) 2006   Past Surgical History:  Procedure Laterality Date  . APPENDECTOMY    . BREAST BIOPSY Left 02/25/2015   path pending  . BREAST CYST ASPIRATION    . BREAST LUMPECTOMY WITH NEEDLE LOCALIZATION Left 10/02/2015   Procedure: BREAST LUMPECTOMY WITH NEEDLE LOCALIZATION;   Surgeon: [name], MD;  Location: [facility] ORS;  Service: General;  Laterality: Left;  . BREAST LUMPECTOMY WITH SENTINEL LYMPH NODE BIOPSY Left 10/02/2015   Procedure: LEFT BREAST WIDE EXCISION WITH SENTINEL LYMPH NODE BX;  Surgeon: [name], MD;  Location: [facility] ORS;  Service: General;  Laterality: Left;  . BREAST SURGERY     breast biopsy benign  . CHOLECYSTECTOMY    . ESOPHAGOGASTRODUODENOSCOPY  07/2009   normal with few gastric polyps  . MOLE REMOVAL    . PORT-A-CATH REMOVAL    . PORTACATH PLACEMENT    . PORTACATH PLACEMENT Right 03/12/2015   Procedure: INSERTION PORT-A-CATH;  Surgeon: [name], MD;  Location: [facility] ORS;  Service: General;  Laterality: Right;  . RADICAL NECK DISSECTION    . TONSILLECTOMY     cancer treated with chemo and radiation   Social History   Social History  . Marital status: Married    Spouse name: N/A  . Number of children: N/A  . Years of education: N/A   Occupational History  . Not on file.   Social History Main Topics  . Smoking status: Former Smoker    Packs/day: 0.50    Years: 6.00    Types: Cigarettes    Quit date: 03/10/1984  . Smokeless tobacco: Never Used  . Alcohol use No  . Drug use: No  . Sexual activity: Not on file   Other Topics Concern  . Not on file   Social History Narrative  . No narrative on file   Social History   Social History Narrative  . No narrative on file     ROS: Negative.     PE: HEENT: Negative. Lungs: Clear. Cardio: RR.  Assessment/Plan:  Proceed with planned Procedure.  10/02/2015

## 2015-10-02 NOTE — Anesthesia Procedure Notes (Signed)
Procedure Name: LMA Insertion Date/Time: 10/02/2015 11:03 AM Performed by: Silvana Newness Pre-anesthesia Checklist: Patient identified, Emergency Drugs available, Suction available, Patient being monitored and Timeout performed Patient Re-evaluated:Patient Re-evaluated prior to inductionOxygen Delivery Method: Circle system utilized Preoxygenation: Pre-oxygenation with 100% oxygen Intubation Type: IV induction Ventilation: Mask ventilation without difficulty LMA: LMA inserted LMA Size: 4.0 Number of attempts: 1 Placement Confirmation: positive ETCO2 and breath sounds checked- equal and bilateral Tube secured with: Tape Dental Injury: Teeth and Oropharynx as per pre-operative assessment

## 2015-10-02 NOTE — Anesthesia Postprocedure Evaluation (Signed)
Anesthesia Post Note  Patient: Suzanne Strong  Procedure(s) Performed: Procedure(s) (LRB): LEFT BREAST WIDE EXCISION WITH SENTINEL LYMPH NODE BX (Left) BREAST LUMPECTOMY WITH NEEDLE LOCALIZATION (Left)  Patient location during evaluation: PACU Anesthesia Type: General Level of consciousness: awake and alert Pain management: pain level controlled Vital Signs Assessment: post-procedure vital signs reviewed and stable Respiratory status: spontaneous breathing, nonlabored ventilation, respiratory function stable and patient connected to nasal cannula oxygen Cardiovascular status: blood pressure returned to baseline and stable Postop Assessment: no signs of nausea or vomiting Anesthetic complications: no    Last Vitals:  Vitals:   10/02/15 1224 10/02/15 1235  BP: (!) 124/58 140/61  Pulse: 89 88  Resp: (!) 22 20  Temp:      Last Pain:  Vitals:   10/02/15 0951  TempSrc: Tympanic  PainSc: 0-No pain                 Molli Barrows

## 2015-10-02 NOTE — Anesthesia Preprocedure Evaluation (Signed)
Anesthesia Evaluation  Patient identified by MRN, date of birth, ID band Patient awake    Reviewed: Allergy & Precautions, H&P , NPO status , Patient's Chart, lab work & pertinent test results, reviewed documented beta blocker date and time   Airway Mallampati: II  TM Distance: >3 FB Neck ROM: full    Dental  (+) Teeth Intact   Pulmonary neg pulmonary ROS, former smoker,    Pulmonary exam normal        Cardiovascular Exercise Tolerance: Good hypertension, On Medications negative cardio ROS Normal cardiovascular exam Rate:Normal     Neuro/Psych PSYCHIATRIC DISORDERS negative neurological ROS  negative psych ROS   GI/Hepatic negative GI ROS, Neg liver ROS, GERD  Medicated,  Endo/Other  negative endocrine ROSdiabetes  Renal/GU negative Renal ROS  negative genitourinary   Musculoskeletal   Abdominal   Peds  Hematology negative hematology ROS (+) anemia ,   Anesthesia Other Findings   Reproductive/Obstetrics negative OB ROS                             Anesthesia Physical Anesthesia Plan  ASA: II  Anesthesia Plan: General LMA   Post-op Pain Management:    Induction:   Airway Management Planned:   Additional Equipment:   Intra-op Plan:   Post-operative Plan:   Informed Consent: I have reviewed the patients History and Physical, chart, labs and discussed the procedure including the risks, benefits and alternatives for the proposed anesthesia with the patient or authorized representative who has indicated his/her understanding and acceptance.     Plan Discussed with: CRNA  Anesthesia Plan Comments:         Anesthesia Quick Evaluation

## 2015-10-02 NOTE — Op Note (Signed)
Preoperative diagnosis: Left breast cancer, status post neoadjuvant chemotherapy.  Postoperative diagnosis: Same.  Operative procedure: Left breast wide excision with needle localization, sentinel node biopsy.  Operating surgeon: Hervey Ard, M.D.  Anesthesia: Gen. by LMA. 0.5% Marcaine with 1-200,000 epinephrine, 30 mL.  Estimated blood loss: Less than 10 mL.  Clinical note: This 70 year old woman was identified with a left breast mass with biopsy showing evidence of invasive mammary carcinoma. She underwent neoadjuvant chemotherapy with an excellent clinical response. No evidence of recurrent disease on clinical exam or ultrasound. She is a candidate now for excision of the original tumor site and sentinel node biopsy.  Operative note: The patient had a localizing wire placed by the radiologist prior to the procedure. She was injected with technetium sulfur colloid prior to the procedure. She underwent general anesthesia without difficulty. The area of the left breast and chest was prepped with ChloraPrep after the instillation of 5 mL of 0.5% methylene blue in the subareolar plexus. The ultrasound was used to identify the course of the localizing wire. An incision in the upper-outer quadrant of the left breast was made after instillation of local anesthetic. The skin was incised sharply and the remaining dissection completed with electrocautery. The localizing wire was brought in of the wound. The tip of the wire was identified and a 5 x 5 x 3 cm block of tissue was excised down to the underlying fascia. The specimen was orientated and specimen radiograph confirmed the intact wire as well as the previously placed localizing clip. The specimen was sent to pathology for review with no evidence of residual disease on gross exam. The entire specimen will be embedded.  Attention was turned to the axilla. Making use of the node finder device the area of increased counts were identified in the left  axilla.  It was possible to identify this area through the wide excision site. Also identified was a single prominent lymphatic extending to the same lymph node. Ex the low counts 1400. In vivo counts 300. No counts over 30 identified in the remaining axilla. This was 6 mm in diameter and sent in formalin for routine histology. Scanning through the remaining axilla showed no other areas of increased uptake and palpation was negative. The wound was closed in multiple layers with 2-0 Vicryl figure-of-eight sutures to the deep layer, a running 2-0 Vicryl suture to the superficial adipose layer and a running 4-0 Vicryl subcuticular suture for skin closure.  Benzoin, Steri-Strips are Telfa pad applied. Surgical bra, with fluffed gauze applied.  Patient tolerated the procedure well and was taken to recovery in stable condition.

## 2015-10-04 LAB — SURGICAL PATHOLOGY

## 2015-10-05 ENCOUNTER — Telehealth: Payer: Self-pay | Admitting: General Surgery

## 2015-10-05 NOTE — Telephone Encounter (Signed)
Patient notified of path results and favorable prognosis with complete pathologic response.  Aware Dr. Rogue Bussing will be making final recommendations re: additional treatment. (Triple negative tumor).

## 2015-10-07 ENCOUNTER — Other Ambulatory Visit: Payer: Self-pay | Admitting: *Deleted

## 2015-10-07 DIAGNOSIS — G629 Polyneuropathy, unspecified: Secondary | ICD-10-CM

## 2015-10-07 MED ORDER — GABAPENTIN 100 MG PO CAPS: 300.0000 mg | ORAL_CAPSULE | Freq: Two times a day (BID) | ORAL | 0 refills | Status: DC

## 2015-10-09 ENCOUNTER — Encounter: Payer: Self-pay | Admitting: General Surgery

## 2015-10-09 ENCOUNTER — Ambulatory Visit (INDEPENDENT_AMBULATORY_CARE_PROVIDER_SITE_OTHER): Payer: Medicare Other | Admitting: General Surgery

## 2015-10-09 VITALS — BP 132/70 | HR 80 | Resp 14 | Ht 66.0 in | Wt 207.0 lb

## 2015-10-09 DIAGNOSIS — C50412 Malignant neoplasm of upper-outer quadrant of left female breast: Secondary | ICD-10-CM

## 2015-10-09 LAB — GLUCOSE, CAPILLARY: GLUCOSE-CAPILLARY: 84 mg/dL (ref 65–99)

## 2015-10-09 NOTE — Progress Notes (Signed)
Patient ID: Suzanne Strong, female   DOB: Nov 02, 1945, 70 y.o.   MRN: [id number]  Chief Complaint  Patient presents with  . Routine Post Op    left lumpectomy    HPI Suzanne Strong is a 70 y.o. female here today for her post op left breast lumpectomy done on 10/02/15. Patient states she is doing well. The patient reports that she took 1 pain pill postoperatively.  Patient to see Dr. Rogue Bussing on 10/24/15.  HPI  Past Medical History:  Diagnosis Date  . Anemia    iron deficiency and B12 deficiency  . Anxiety   . Breast cancer ([place]) 02/25/2015   upper-outer quadrant of left female breast, ER, PR, HER-2 Negative  . Cervical dysplasia    conization  . Diabetes mellitus without complication ([place])   . Diverticulosis   . Fever 04/11/2015  . Gastroparesis    related to previous radiation tx  . GERD (gastroesophageal reflux disease)   . Hyperlipidemia   . Hypertension   . Shingles    Hx of  . 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 APPENDECTOMY    . BREAST BIOPSY Left 02/25/2015   path pending  . BREAST CYST ASPIRATION    . BREAST LUMPECTOMY WITH NEEDLE LOCALIZATION Left 10/02/2015   Procedure: BREAST LUMPECTOMY WITH NEEDLE LOCALIZATION;  Surgeon: [name], MD;  Location: [facility] ORS;  Service: General;  Laterality: Left;  . BREAST LUMPECTOMY WITH SENTINEL LYMPH NODE BIOPSY Left 10/02/2015   Procedure: LEFT BREAST WIDE EXCISION WITH SENTINEL LYMPH NODE BX;  Surgeon: [name], MD;  Location: [facility] ORS;  Service: General;  Laterality: Left;  . BREAST SURGERY     breast biopsy benign  . CHOLECYSTECTOMY    . ESOPHAGOGASTRODUODENOSCOPY  07/2009   normal with few gastric polyps  . MOLE REMOVAL    . PORT-A-CATH REMOVAL    . PORTACATH PLACEMENT    . PORTACATH PLACEMENT Right 03/12/2015   Procedure: INSERTION PORT-A-CATH;  Surgeon: [name], MD;  Location: [facility] ORS;  Service: General;  Laterality: Right;  . RADICAL NECK  DISSECTION    . TONSILLECTOMY     cancer treated with chemo and radiation    Family History  Problem Relation Age of Onset  . Osteoporosis Mother   . Hyperlipidemia Mother   . Depression Mother   . Cancer Mother     skin cancer ? basal cell and lung ca heavy smoker  . Alcohol abuse Father   . Cancer Father     skin CA ? basal cell  . Heart disease Father     CHF  . Hyperlipidemia Brother   . Hypertension Brother     Social History Social History  Substance Use Topics  . Smoking status: Former Smoker    Packs/day: 0.50    Years: 6.00    Types: Cigarettes    Quit date: 03/10/1984  . Smokeless tobacco: Never Used  . Alcohol use No    Allergies  Allergen Reactions  . Amoxicillin     REACTION: rash  . Fluconazole     REACTION: hives    Current Outpatient Prescriptions  Medication Sig Dispense Refill  . ALPRAZolam (XANAX) 0.5 MG tablet Take 1 tablet (0.5 mg total) by mouth at bedtime as needed for sleep. 30 tablet 3  . atorvastatin (LIPITOR) 10 MG tablet Take 1 tablet (10 mg total) by mouth daily. (Patient taking differently: Take 10 mg by  mouth every morning. ) 30 tablet 2  . b complex vitamins tablet Take 1 tablet by mouth daily.      . cyanocobalamin (,VITAMIN B-12,) 1000 MCG/ML injection Inject 1,000 mcg into the muscle every 30 (thirty) days.      . ferrous sulfate 325 (65 FE) MG tablet Take 325 mg by mouth daily with breakfast. Reported on 08/27/2015    . fluticasone (FLONASE) 50 MCG/ACT nasal spray instill 2 sprays into each nostril once daily 16 g 5  . gabapentin (NEURONTIN) 100 MG capsule Take 3 capsules (300 mg total) by mouth 2 (two) times daily. 180 capsule 0  . glipiZIDE (GLUCOTROL) 10 MG tablet Take 20 mg by mouth daily before breakfast. Take 2 tabs daily on the day of chemotherapy and the day after chemo    . HYDROcodone-acetaminophen (NORCO) 5-325 MG tablet Take 1-2 tablets by mouth every 4 (four) hours as needed for moderate pain. 30 tablet 0  . JANUMET XR  50-1000 MG TB24 Take 1 tablet by mouth 2 (two) times daily.   0  . lidocaine-prilocaine (EMLA) cream Apply 1 application topically as needed. Apply to port when going through Chemo    . lisinopril (PRINIVIL,ZESTRIL) 5 MG tablet Take 1 tablet (5 mg total) by mouth daily. (Patient taking differently: Take 5 mg by mouth at bedtime. ) 30 tablet 2  . magnesium chloride (SLOW-MAG) 64 MG TBEC SR tablet Take 1 tablet (64 mg total) by mouth daily. (Patient taking differently: Take 1 tablet by mouth every morning. ) 30 tablet 2  . mirtazapine (REMERON) 30 MG tablet take 2 tablet by mouth at bedtime  0  . omeprazole (PRILOSEC) 20 MG capsule take 1 capsule by mouth once daily (Patient taking differently: take 1 capsule by mouth once daily-bedtime) 30 capsule 2  . ondansetron (ZOFRAN) 8 MG tablet take 1 tablet by mouth twice a day (START ON THE 3RD DAY AFTER CHEMOTHERAPY) 30 tablet 1  . potassium chloride (K-DUR) 10 MEQ tablet Take 1 tablet (10 mEq total) by mouth daily. 30 tablet 2  . rOPINIRole (REQUIP) 1 MG tablet 1 pill in the am as needed by mouth and 3 pills at bedtime by mouth (Patient taking differently: Take 1 mg by mouth See admin instructions. 1 pill in the am as needed by mouth and 3 pills at bedtime by mouth) 120 tablet 2  . sertraline (ZOLOFT) 100 MG tablet Take 200 mg by mouth every morning. Reported on 08/27/2015  0   No current facility-administered medications for this visit.    Facility-Administered Medications Ordered in Other Visits  Medication Dose Route Frequency Provider Last Rate Last Dose  . sodium chloride flush (NS) 0.9 % injection 10 mL  10 mL Intravenous PRN Cammie Sickle, MD   10 mL at 09/11/15 0845    Review of Systems Review of Systems  Constitutional: Negative.   Respiratory: Negative.   Cardiovascular: Negative.     Blood pressure 132/70, pulse 80, resp. rate 14, height _0  (1.676 m), weight 207 lb (93.9 kg).  Physical Exam Physical Exam  Constitutional: She  is oriented to person, place, and time. She appears well-developed and well-nourished.  Pulmonary/Chest:    Left breast incision is clean and healing well.   Neurological: She is alert and oriented to person, place, and time.  Skin: Skin is warm and dry.    Data Reviewed Pathology from the 10/02/2015 wide excision and sentinel node biopsy showed no residual tumor in either the breast  or axillary lymph node. This result correlated with clinical exam and preoperative imaging.  Assessment    Stage 0 status post neoadjuvant chemotherapy.    Plan    In light of the patient's positive nodal status prior to surgery she'll likely be a candidate for whole breast radiation.    Patient to return in three  weeks. Patient to see Dr. Baruch Gouty on 10/25/15 at 9:00 am. This information has been scribed by Gaspar Cola CMA.   [name] 10/09/2015, 8:55 PM

## 2015-10-09 NOTE — Patient Instructions (Addendum)
Return in three weeks.. 

## 2015-10-24 ENCOUNTER — Inpatient Hospital Stay (HOSPITAL_BASED_OUTPATIENT_CLINIC_OR_DEPARTMENT_OTHER): Payer: Medicare Other | Admitting: Internal Medicine

## 2015-10-24 ENCOUNTER — Inpatient Hospital Stay: Payer: Medicare Other | Attending: Internal Medicine

## 2015-10-24 ENCOUNTER — Encounter: Payer: Self-pay | Admitting: Internal Medicine

## 2015-10-24 VITALS — BP 131/80 | HR 86 | Temp 95.5°F | Resp 18 | Ht 66.0 in | Wt 207.2 lb

## 2015-10-24 DIAGNOSIS — N879 Dysplasia of cervix uteri, unspecified: Secondary | ICD-10-CM | POA: Diagnosis not present

## 2015-10-24 DIAGNOSIS — E119 Type 2 diabetes mellitus without complications: Secondary | ICD-10-CM | POA: Insufficient documentation

## 2015-10-24 DIAGNOSIS — Z171 Estrogen receptor negative status [ER-]: Secondary | ICD-10-CM | POA: Insufficient documentation

## 2015-10-24 DIAGNOSIS — K219 Gastro-esophageal reflux disease without esophagitis: Secondary | ICD-10-CM

## 2015-10-24 DIAGNOSIS — I10 Essential (primary) hypertension: Secondary | ICD-10-CM | POA: Diagnosis not present

## 2015-10-24 DIAGNOSIS — R200 Anesthesia of skin: Secondary | ICD-10-CM | POA: Diagnosis not present

## 2015-10-24 DIAGNOSIS — E538 Deficiency of other specified B group vitamins: Secondary | ICD-10-CM | POA: Diagnosis not present

## 2015-10-24 DIAGNOSIS — C50412 Malignant neoplasm of upper-outer quadrant of left female breast: Secondary | ICD-10-CM | POA: Insufficient documentation

## 2015-10-24 DIAGNOSIS — Z79899 Other long term (current) drug therapy: Secondary | ICD-10-CM | POA: Diagnosis not present

## 2015-10-24 DIAGNOSIS — F419 Anxiety disorder, unspecified: Secondary | ICD-10-CM | POA: Diagnosis not present

## 2015-10-24 DIAGNOSIS — D509 Iron deficiency anemia, unspecified: Secondary | ICD-10-CM

## 2015-10-24 DIAGNOSIS — Z87891 Personal history of nicotine dependence: Secondary | ICD-10-CM | POA: Insufficient documentation

## 2015-10-24 DIAGNOSIS — R202 Paresthesia of skin: Secondary | ICD-10-CM | POA: Diagnosis not present

## 2015-10-24 DIAGNOSIS — E785 Hyperlipidemia, unspecified: Secondary | ICD-10-CM | POA: Diagnosis not present

## 2015-10-24 DIAGNOSIS — Z8589 Personal history of malignant neoplasm of other organs and systems: Secondary | ICD-10-CM

## 2015-10-24 DIAGNOSIS — G629 Polyneuropathy, unspecified: Secondary | ICD-10-CM

## 2015-10-24 DIAGNOSIS — K3184 Gastroparesis: Secondary | ICD-10-CM | POA: Insufficient documentation

## 2015-10-24 DIAGNOSIS — K5790 Diverticulosis of intestine, part unspecified, without perforation or abscess without bleeding: Secondary | ICD-10-CM

## 2015-10-24 LAB — COMPREHENSIVE METABOLIC PANEL
ALK PHOS: 80 U/L (ref 38–126)
ALT: 24 U/L (ref 14–54)
ANION GAP: 7 (ref 5–15)
AST: 30 U/L (ref 15–41)
Albumin: 4 g/dL (ref 3.5–5.0)
BILIRUBIN TOTAL: 0.4 mg/dL (ref 0.3–1.2)
BUN: 13 mg/dL (ref 6–20)
CALCIUM: 9.4 mg/dL (ref 8.9–10.3)
CO2: 26 mmol/L (ref 22–32)
Chloride: 103 mmol/L (ref 101–111)
Creatinine, Ser: 0.81 mg/dL (ref 0.44–1.00)
GFR calc Af Amer: >60 mL/min (ref >60)
Glucose, Bld: 125 mg/dL — ABNORMAL HIGH (ref 65–99)
POTASSIUM: 4.7 mmol/L (ref 3.5–5.1)
Sodium: 136 mmol/L (ref 135–145)
TOTAL PROTEIN: 7.6 g/dL (ref 6.5–8.1)

## 2015-10-24 LAB — CBC WITH DIFFERENTIAL/PLATELET
Basophils Absolute: 0 10*3/uL (ref 0–0.1)
Basophils Relative: 1 %
Eosinophils Absolute: 0.5 10*3/uL (ref 0–0.7)
Eosinophils Relative: 9 %
HEMATOCRIT: 32 % — AB (ref 35.0–47.0)
HEMOGLOBIN: 10.8 g/dL — AB (ref 12.0–16.0)
LYMPHS PCT: 15 %
Lymphs Abs: 0.8 10*3/uL — ABNORMAL LOW (ref 1.0–3.6)
MCH: 30.6 pg (ref 26.0–34.0)
MCHC: 33.6 g/dL (ref 32.0–36.0)
MCV: 91.1 fL (ref 80.0–100.0)
MONO ABS: 0.3 10*3/uL (ref 0.2–0.9)
MONOS PCT: 7 %
NEUTROS ABS: 3.4 10*3/uL (ref 1.4–6.5)
NEUTROS PCT: 68 %
Platelets: 217 10*3/uL (ref 150–440)
RBC: 3.51 MIL/uL — ABNORMAL LOW (ref 3.80–5.20)
RDW: 16.4 % — AB (ref 11.5–14.5)
WBC: 5 10*3/uL (ref 3.6–11.0)

## 2015-10-24 MED ORDER — GABAPENTIN 300 MG PO CAPS: ORAL_CAPSULE | ORAL | 3 refills | Status: DC

## 2015-10-24 NOTE — Progress Notes (Signed)
Pt complains today that the neuropathy has been giving her a hard time.  Reports her surgery was 16th of August and still has some pain in left breast area that throbs but not on a daily basis.

## 2015-10-24 NOTE — Assessment & Plan Note (Addendum)
Left breast cancer stage III  ER/PR/her 2 Neu NEGCurrently on neoadjuvant chemotherapy; excellent response. S/p Surgery- complete pathologic response. appt with Dr. Donella Stade.  # Reviewed the pathology with the patient. I would not recommend any further chemotherapy or antihormone therapy as patient is ER/PR negative.  # PN- G-2; go up to 300 three pills a day.   # Hypomagnesemia- 1.6; on home supplementation.  # follow up in 4 weeks/ labs/port flush.

## 2015-10-24 NOTE — Progress Notes (Signed)
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 5 CM TUMOR MASS BIOPSIES POSITIVE FOR INVASIVE MAMMARY CARCINOMA TRIPLE NEGATIVE DISEASE(jANUARY, 2017)stage IIIa. ER/PR/her2 Neu-NEGATIVE  # Cytoxan and Adriamycin from March 18, 2015; carbo-taxol x4 cycles  # AUg 18th s/p Lumpec & SLNBx- Complete path CR [ypT0 ypsn0]  3. MUGA scan of the heart shows ejection fraction to be 61% (January, 2017) 4     Cancer of left breast ([place])   03/01/2015 Initial Diagnosis    Cancer of left breast ([place])       Malignant neoplasm of upper-outer quadrant of left female breast ([place])   03/11/2015 Initial Diagnosis    Malignant neoplasm of upper-outer quadrant of left female breast ([place])      INTERVAL HISTORY:  Suzanne Strong 70 y.o.  female pleasant patient above history of stage IIIa breast cancer currently on neoadjuvant chemotherapy Is currently status post lumpectomy with sentinel lymph node biopsy is here for follow-up review the pathology.  Postoperative recovery was uneventful. Patient is healing well. Patient appointment tomorrow with radiation oncology.  Patient complains of moderate tingling and numbness of extremities mostly at nighttime. Patient is taking Neurontin 100 mg TID. No chest pain or shortness  of breath or cough. Denies any new lumps or bumps.  REVIEW OF SYSTEMS:  A complete 10 point review of system is done which is negative except mentioned above/history of present illness.   PAST MEDICAL HISTORY :  Past Medical History:  Diagnosis Date  . Anemia    iron deficiency and B12 deficiency  . Anxiety   . Breast cancer ([place]) 02/25/2015   upper-outer quadrant of left female breast, ER, PR, HER-2 Negative  . Cervical dysplasia    conization  . Diabetes mellitus without complication ([place])   . Diverticulosis   . Fever 04/11/2015  . Gastroparesis    related to previous radiation tx  . GERD (gastroesophageal reflux disease)   . Hyperlipidemia   . Hypertension   . Shingles    Hx of  . 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 APPENDECTOMY    . BREAST BIOPSY Left 02/25/2015   path pending  . BREAST CYST ASPIRATION    . BREAST LUMPECTOMY WITH NEEDLE LOCALIZATION Left 10/02/2015   Procedure: BREAST LUMPECTOMY WITH NEEDLE LOCALIZATION;  Surgeon: [name], MD;  Location: [facility] ORS;  Service: General;  Laterality: Left;  . BREAST LUMPECTOMY WITH SENTINEL LYMPH NODE BIOPSY Left 10/02/2015   Procedure: LEFT BREAST WIDE EXCISION WITH SENTINEL LYMPH NODE BX;  Surgeon: [name], MD;  Location: [facility] ORS;  Service: General;  Laterality: Left;  . BREAST SURGERY     breast biopsy benign  . CHOLECYSTECTOMY    .  ESOPHAGOGASTRODUODENOSCOPY  07/2009   normal with few gastric polyps  . MOLE REMOVAL    . PORT-A-CATH REMOVAL    . PORTACATH PLACEMENT    . PORTACATH PLACEMENT Right 03/12/2015   Procedure: INSERTION PORT-A-CATH;  Surgeon: [name], MD;  Location: [facility] ORS;  Service: General;  Laterality: Right;  . RADICAL NECK DISSECTION    . TONSILLECTOMY     cancer treated with chemo and radiation    FAMILY HISTORY :   Family History  Problem Relation Age of Onset  . Osteoporosis Mother   .  Hyperlipidemia Mother   . Depression Mother   . Cancer Mother     skin cancer ? basal cell and lung ca heavy smoker  . Alcohol abuse Father   . Cancer Father     skin CA ? basal cell  . Heart disease Father     CHF  . Hyperlipidemia Brother   . Hypertension Brother     SOCIAL HISTORY:   Social History  Substance Use Topics  . Smoking status: Former Smoker    Packs/day: 0.50    Years: 6.00    Types: Cigarettes    Quit date: 03/10/1984  . Smokeless tobacco: Never Used  . Alcohol use No    ALLERGIES:  is allergic to amoxicillin and fluconazole.  MEDICATIONS:  Current Outpatient Prescriptions  Medication Sig Dispense Refill  . ALPRAZolam (XANAX) 0.5 MG tablet Take 1 tablet (0.5 mg total) by mouth at bedtime as needed for sleep. 30 tablet 3  . atorvastatin (LIPITOR) 10 MG tablet Take 1 tablet (10 mg total) by mouth daily. (Patient taking differently: Take 10 mg by mouth every morning. ) 30 tablet 2  . b complex vitamins tablet Take 1 tablet by mouth daily.      . cyanocobalamin (,VITAMIN B-12,) 1000 MCG/ML injection Inject 1,000 mcg into the muscle every 30 (thirty) days.      . ferrous sulfate 325 (65 FE) MG tablet Take 325 mg by mouth daily with breakfast. Reported on 08/27/2015    . fluticasone (FLONASE) 50 MCG/ACT nasal spray instill 2 sprays into each nostril once daily 16 g 5  . gabapentin (NEURONTIN) 300 MG capsule Take 1 pill in am; and 2 pills in PM 90 capsule 3  . glipiZIDE (GLUCOTROL) 10 MG tablet Take 20 mg by mouth daily before breakfast. Take 2 tabs daily on the day of chemotherapy and the day after chemo    . HYDROcodone-acetaminophen (NORCO) 5-325 MG tablet Take 1-2 tablets by mouth every 4 (four) hours as needed for moderate pain. 30 tablet 0  . JANUMET XR 50-1000 MG TB24 Take 1 tablet by mouth 2 (two) times daily.   0  . lidocaine-prilocaine (EMLA) cream Apply 1 application topically as needed. Apply to port when going through Chemo    . lisinopril  (PRINIVIL,ZESTRIL) 5 MG tablet Take 1 tablet (5 mg total) by mouth daily. (Patient taking differently: Take 5 mg by mouth at bedtime. ) 30 tablet 2  . magnesium chloride (SLOW-MAG) 64 MG TBEC SR tablet Take 1 tablet (64 mg total) by mouth daily. (Patient taking differently: Take 1 tablet by mouth every morning. ) 30 tablet 2  . mirtazapine (REMERON) 30 MG tablet take 2 tablet by mouth at bedtime  0  . omeprazole (PRILOSEC) 20 MG capsule take 1 capsule by mouth once daily (Patient taking differently: take 1 capsule by mouth once daily-bedtime) 30 capsule 2  . ondansetron (ZOFRAN) 8 MG tablet take 1  tablet by mouth twice a day (START ON THE 3RD DAY AFTER CHEMOTHERAPY) 30 tablet 1  . potassium chloride (K-DUR) 10 MEQ tablet Take 1 tablet (10 mEq total) by mouth daily. 30 tablet 2  . rOPINIRole (REQUIP) 1 MG tablet 1 pill in the am as needed by mouth and 3 pills at bedtime by mouth (Patient taking differently: Take 1 mg by mouth See admin instructions. 1 pill in the am as needed by mouth and 3 pills at bedtime by mouth) 120 tablet 2  . sertraline (ZOLOFT) 100 MG tablet Take 200 mg by mouth every morning. Reported on 08/27/2015  0   No current facility-administered medications for this visit.    Facility-Administered Medications Ordered in Other Visits  Medication Dose Route Frequency Provider Last Rate Last Dose  . sodium chloride flush (NS) 0.9 % injection 10 mL  10 mL Intravenous PRN Cammie Sickle, MD   10 mL at 09/11/15 0845    PHYSICAL EXAMINATION: ECOG PERFORMANCE STATUS: 0 - Asymptomatic  BP 131/80 (BP Location: Left Arm, Patient Position: Sitting)   Pulse 86   Temp (!) 95.5 F (35.3 C) (Tympanic)   Resp 18   Ht '5\' 6"'  (1.676 m)   Wt 207 lb 3.2 oz (94 kg)   BMI 33.44 kg/m   Filed Weights   10/24/15 1543  Weight: 207 lb 3.2 oz (94 kg)    GENERAL: Well-nourished well-developed; Alert, no distress and comfortable.   Alone. EYES: no pallor or icterus OROPHARYNX: no thrush or  ulceration; good dentition  NECK: supple, no masses felt LYMPH:  no palpable lymphadenopathy in the cervical, axillary or inguinal regions LUNGS: clear to auscultation and  No wheeze or crackles HEART/CVS: regular rate & rhythm and no murmurs; No lower extremity edema ABDOMEN:abdomen soft, non-tender and normal bowel sounds Musculoskeletal:no cyanosis of digits and no clubbing  PSYCH: alert & oriented x 3 with fluent speech NEURO: no focal motor/sensory deficits SKIN:  no rashes or significant lesions  LABORATORY DATA:  I have reviewed the data as listed    Component Value Date/Time   NA 136 10/24/2015 1522   NA 142 03/01/2014 0846   K 4.7 10/24/2015 1522   K 4.3 03/01/2014 0846   CL 103 10/24/2015 1522   CL 102 03/01/2014 0846   CO2 26 10/24/2015 1522   CO2 28 03/01/2014 0846   GLUCOSE 125 (H) 10/24/2015 1522   GLUCOSE 219 (H) 03/01/2014 0846   BUN 13 10/24/2015 1522   BUN 12 03/01/2014 0846   CREATININE 0.81 10/24/2015 1522   CREATININE 0.95 03/01/2014 0846   CALCIUM 9.4 10/24/2015 1522   CALCIUM 8.6 03/01/2014 0846   PROT 7.6 10/24/2015 1522   PROT 7.5 03/01/2014 0846   ALBUMIN 4.0 10/24/2015 1522   ALBUMIN 3.4 03/01/2014 0846   AST 30 10/24/2015 1522   AST 20 03/01/2014 0846   ALT 24 10/24/2015 1522   ALT 28 03/01/2014 0846   ALKPHOS 80 10/24/2015 1522   ALKPHOS 122 (H) 03/01/2014 0846   BILITOT 0.4 10/24/2015 1522   BILITOT 0.2 03/01/2014 0846   GFRNONAA >60 10/24/2015 1522   GFRNONAA >60 03/01/2014 0846   GFRNONAA >60 08/24/2013 1015   GFRAA >60 10/24/2015 1522   GFRAA >60 03/01/2014 0846   GFRAA >60 08/24/2013 1015    No results found for: SPEP, UPEP  Lab Results  Component Value Date   WBC 5.0 10/24/2015   NEUTROABS 3.4 10/24/2015   HGB 10.8 (L) 10/24/2015   HCT  32.0 (L) 10/24/2015   MCV 91.1 10/24/2015   PLT 217 10/24/2015      Chemistry      Component Value Date/Time   NA 136 10/24/2015 1522   NA 142 03/01/2014 0846   K 4.7 10/24/2015 1522    K 4.3 03/01/2014 0846   CL 103 10/24/2015 1522   CL 102 03/01/2014 0846   CO2 26 10/24/2015 1522   CO2 28 03/01/2014 0846   BUN 13 10/24/2015 1522   BUN 12 03/01/2014 0846   CREATININE 0.81 10/24/2015 1522   CREATININE 0.95 03/01/2014 0846      Component Value Date/Time   CALCIUM 9.4 10/24/2015 1522   CALCIUM 8.6 03/01/2014 0846   ALKPHOS 80 10/24/2015 1522   ALKPHOS 122 (H) 03/01/2014 0846   AST 30 10/24/2015 1522   AST 20 03/01/2014 0846   ALT 24 10/24/2015 1522   ALT 28 03/01/2014 0846   BILITOT 0.4 10/24/2015 1522   BILITOT 0.2 03/01/2014 0846       RADIOGRAPHIC STUDIES: I have personally reviewed the radiological images as listed and agreed with the findings in the report. No results found.   ASSESSMENT & PLAN:  Malignant neoplasm of upper-outer quadrant of left female breast ([place]) Left breast cancer stage III  ER/PR/her 2 Neu NEGCurrently on neoadjuvant chemotherapy; excellent response. S/p Surgery- complete pathologic response. appt with Dr. Donella Stade.  # Reviewed the pathology with the patient. I would not recommend any further chemotherapy or antihormone therapy as patient is ER/PR negative.  # PN- G-2; go up to 300 three pills a day.   # Hypomagnesemia- 1.6; on home supplementation.  # follow up in 4 weeks/ labs/port flush.    Orders Placed This Encounter  Procedures  . CBC with Differential    Standing Status:   Future    Standing Expiration Date:   10/23/2016  . Comprehensive metabolic panel    Standing Status:   Future    Standing Expiration Date:   10/23/2016  . Magnesium    Standing Status:   Future    Standing Expiration Date:   10/23/2016   All questions were answered. The patient knows to call the clinic with any problems, questions or concerns.      Cammie Sickle, MD 10/24/2015 4:53 PM

## 2015-10-25 ENCOUNTER — Encounter: Payer: Self-pay | Admitting: Radiation Oncology

## 2015-10-25 ENCOUNTER — Ambulatory Visit
Admission: RE | Admit: 2015-10-25 | Discharge: 2015-10-25 | Disposition: A | Discharge status: Home / Self Care | Payer: Medicare Other | Source: Ambulatory Visit | Attending: Radiation Oncology | Admitting: Radiation Oncology

## 2015-10-25 ENCOUNTER — Other Ambulatory Visit: Payer: Self-pay | Admitting: Oncology

## 2015-10-25 VITALS — BP 119/73 | HR 79 | Temp 96.8°F | Resp 20 | Wt 207.0 lb

## 2015-10-25 DIAGNOSIS — C50412 Malignant neoplasm of upper-outer quadrant of left female breast: Secondary | ICD-10-CM | POA: Diagnosis not present

## 2015-10-25 DIAGNOSIS — Z809 Family history of malignant neoplasm, unspecified: Secondary | ICD-10-CM | POA: Insufficient documentation

## 2015-10-25 DIAGNOSIS — I10 Essential (primary) hypertension: Secondary | ICD-10-CM | POA: Diagnosis not present

## 2015-10-25 DIAGNOSIS — K219 Gastro-esophageal reflux disease without esophagitis: Secondary | ICD-10-CM | POA: Diagnosis not present

## 2015-10-25 DIAGNOSIS — D649 Anemia, unspecified: Secondary | ICD-10-CM | POA: Diagnosis not present

## 2015-10-25 DIAGNOSIS — Z171 Estrogen receptor negative status [ER-]: Secondary | ICD-10-CM | POA: Diagnosis not present

## 2015-10-25 DIAGNOSIS — Z7984 Long term (current) use of oral hypoglycemic drugs: Secondary | ICD-10-CM | POA: Diagnosis not present

## 2015-10-25 DIAGNOSIS — E119 Type 2 diabetes mellitus without complications: Secondary | ICD-10-CM | POA: Insufficient documentation

## 2015-10-25 DIAGNOSIS — K5790 Diverticulosis of intestine, part unspecified, without perforation or abscess without bleeding: Secondary | ICD-10-CM | POA: Insufficient documentation

## 2015-10-25 DIAGNOSIS — K3184 Gastroparesis: Secondary | ICD-10-CM | POA: Diagnosis not present

## 2015-10-25 DIAGNOSIS — F419 Anxiety disorder, unspecified: Secondary | ICD-10-CM | POA: Insufficient documentation

## 2015-10-25 DIAGNOSIS — Z79899 Other long term (current) drug therapy: Secondary | ICD-10-CM | POA: Insufficient documentation

## 2015-10-25 DIAGNOSIS — Z9221 Personal history of antineoplastic chemotherapy: Secondary | ICD-10-CM | POA: Diagnosis not present

## 2015-10-25 DIAGNOSIS — Z510 Encounter for antineoplastic radiation therapy: Secondary | ICD-10-CM | POA: Insufficient documentation

## 2015-10-25 DIAGNOSIS — Z85819 Personal history of malignant neoplasm of unspecified site of lip, oral cavity, and pharynx: Secondary | ICD-10-CM | POA: Diagnosis not present

## 2015-10-25 DIAGNOSIS — G629 Polyneuropathy, unspecified: Secondary | ICD-10-CM | POA: Diagnosis not present

## 2015-10-25 DIAGNOSIS — Z87891 Personal history of nicotine dependence: Secondary | ICD-10-CM | POA: Insufficient documentation

## 2015-10-25 DIAGNOSIS — N879 Dysplasia of cervix uteri, unspecified: Secondary | ICD-10-CM | POA: Insufficient documentation

## 2015-10-25 DIAGNOSIS — E785 Hyperlipidemia, unspecified: Secondary | ICD-10-CM | POA: Diagnosis not present

## 2015-10-25 NOTE — Consult Note (Signed)
Except an outstanding is perfect of Radiation Oncology NEW PATIENT EVALUATION  Name: Suzanne Strong  MRN: [id number]  Date:   10/25/2015     DOB: Nov 30, 1945   This 70 y.o. 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 Breast Cancer    Pt is here for initial consultation of breast cancer.       DIAGNOSIS: The encounter diagnosis was Malignant neoplasm of upper-outer quadrant of left female breast ([place]).   PREVIOUS INVESTIGATIONS:  PET CT mammograms and ultrasound reviewed Pathology report reviewed Clinical notes reviewed  HPI: Patient is a 70 year old female well known to our department having been treated 10 years prior for head and neck cancer with concurrent chemoradiation. She has done well from that standpoint although recently around the first year presented with a self discovered left breast mass. Initial mammogram ultrasound and PET CT confirmed close to 6 cm left breast mass highly hypermetabolic with adjacent axillary lymph nodes not enlarged and borderline hypermetabolic. On ultrasound there were pathologic axillary lymph nodes. Biopsy was positive for triple negative invasive mammary carcinoma overall nuclear grade 3 with 2 lymph nodes biopsy both negative for malignancy. She went on to have Cytoxan and Adriamycin as well as carboplatinum and Taxol. She then underwent a wide local excision and sentinel node biopsy showing no residual disease in her left breast or sentinel lymph node biopsy. 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 She is healing well and is without complaint specifically denies breast  tenderness cough or bone pain. She is seen today for radiation oncology opinion.  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    PAST SURGICAL HISTORY:  Past Surgical History:  Procedure Laterality Date  . APPENDECTOMY    . BREAST BIOPSY Left 02/25/2015   path pending  . BREAST CYST ASPIRATION    . BREAST LUMPECTOMY WITH NEEDLE LOCALIZATION Left 10/02/2015   Procedure: BREAST LUMPECTOMY WITH NEEDLE LOCALIZATION;  Surgeon: [name], MD;  Location: [facility] ORS;  Service: General;  Laterality: Left;  . BREAST LUMPECTOMY WITH SENTINEL LYMPH NODE BIOPSY Left 10/02/2015   Procedure: LEFT BREAST WIDE EXCISION WITH SENTINEL LYMPH NODE BX;  Surgeon: [name], MD;  Location: [facility] OR[facility];  Service: General;  Laterality: Left;  . BREAST SURGERY     breast biopsy benign  . CHOLECYSTECTOMY    . ESOPHAGOGASTRODUODENOSCOPY  07/2009   normal with few gastric polyps  . MOLE REMOVAL    . PORT-A-CATH REMOVAL    . PORTACATH PLACEMENT    . PORTACATH PLACEMENT Right 03/12/2015   Procedure: INSERTION PORT-A-CATH;  Surgeon: [name], MD;  Location: [facility] ORS;  Service: General;  Laterality: Right;  . RADICAL NECK DISSECTION    . TONSILLECTOMY     cancer treated with chemo and radiation    FAMILY HISTORY: family history includes Alcohol abuse in her father; Cancer in her father and mother; Depression in  her mother; Heart disease in her father; Hyperlipidemia in her brother and mother; Hypertension in her brother; Osteoporosis in her mother.  SOCIAL HISTORY:  reports that she quit smoking about 31 years ago. Her smoking use included  Cigarettes. She has a 3.00 pack-year smoking history. She has never used smokeless tobacco. She reports that she does not drink alcohol or use drugs.  ALLERGIES: Amoxicillin and Fluconazole  MEDICATIONS:  Current Outpatient Prescriptions  Medication Sig Dispense Refill  . ALPRAZolam (XANAX) 0.5 MG tablet Take 1 tablet (0.5 mg total) by mouth at bedtime as needed for sleep. 30 tablet 3  . atorvastatin (LIPITOR) 10 MG tablet Take 1 tablet (10 mg total) by mouth daily. (Patient taking differently: Take 10 mg by mouth every morning. ) 30 tablet 2  . b complex vitamins tablet Take 1 tablet by mouth daily.      . cyanocobalamin (,VITAMIN B-12,) 1000 MCG/ML injection Inject 1,000 mcg into the muscle every 30 (thirty) days.      . ferrous sulfate 325 (65 FE) MG tablet Take 325 mg by mouth daily with breakfast. Reported on 08/27/2015    . fluticasone (FLONASE) 50 MCG/ACT nasal spray instill 2 sprays into each nostril once daily 16 g 5  . gabapentin (NEURONTIN) 300 MG capsule Take 1 pill in am; and 2 pills in PM 90 capsule 3  . glipiZIDE (GLUCOTROL) 10 MG tablet Take 20 mg by mouth daily before breakfast. Take 2 tabs daily on the day of chemotherapy and the day after chemo    . HYDROcodone-acetaminophen (NORCO) 5-325 MG tablet Take 1-2 tablets by mouth every 4 (four) hours as needed for moderate pain. 30 tablet 0  . JANUMET XR 50-1000 MG TB24 Take 1 tablet by mouth 2 (two) times daily.   0  . lidocaine-prilocaine (EMLA) cream Apply 1 application topically as needed. Apply to port when going through Chemo    . lisinopril (PRINIVIL,ZESTRIL) 5 MG tablet Take 1 tablet (5 mg total) by mouth daily. (Patient taking differently: Take 5 mg by mouth at bedtime. ) 30 tablet 2  . magnesium chloride (SLOW-MAG) 64 MG TBEC SR tablet Take 1 tablet (64 mg total) by mouth daily. (Patient taking differently: Take 1 tablet by mouth every morning. ) 30 tablet 2  . mirtazapine (REMERON) 30 MG tablet take 2 tablet by mouth at  bedtime  0  . omeprazole (PRILOSEC) 20 MG capsule take 1 capsule by mouth once daily (Patient taking differently: take 1 capsule by mouth once daily-bedtime) 30 capsule 2  . ondansetron (ZOFRAN) 8 MG tablet take 1 tablet by mouth twice a day (START ON THE 3RD DAY AFTER CHEMOTHERAPY) 30 tablet 1  . potassium chloride (K-DUR) 10 MEQ tablet Take 1 tablet (10 mEq total) by mouth daily. 30 tablet 2  . rOPINIRole (REQUIP) 1 MG tablet 1 pill in the am as needed by mouth and 3 pills at bedtime by mouth (Patient taking differently: Take 1 mg by mouth See admin instructions. 1 pill in the am as needed by mouth and 3 pills at bedtime by mouth) 120 tablet 2  . sertraline (ZOLOFT) 100 MG tablet Take 200 mg by mouth every morning. Reported on 08/27/2015  0   No current facility-administered medications for this encounter.    Facility-Administered Medications Ordered in Other Encounters  Medication Dose Route Frequency Provider Last Rate Last Dose  . sodium chloride flush (NS) 0.9 % injection 10 mL  10 mL Intravenous PRN Cammie Sickle, MD  10 mL at 09/11/15 0845    ECOG PERFORMANCE STATUS:  0 - Asymptomatic  REVIEW OF SYSTEMS:  Patient denies any weight loss, fatigue, weakness, fever, chills or night sweats. Patient denies any loss of vision, blurred vision. Patient denies any ringing  of the ears or hearing loss. No irregular heartbeat. Patient denies heart murmur or history of fainting. Patient denies any chest pain or pain radiating to her upper extremities. Patient denies any shortness of breath, difficulty breathing at night, cough or hemoptysis. Patient denies any swelling in the lower legs. Patient denies any nausea vomiting, vomiting of blood, or coffee ground material in the vomitus. Patient denies any stomach pain. Patient states has had normal bowel movements no significant constipation or diarrhea. Patient denies any dysuria, hematuria or significant nocturia. Patient denies any problems  walking, swelling in the joints or loss of balance. Patient denies any skin changes, loss of hair or loss of weight. Patient denies any excessive worrying or anxiety or significant depression. Patient denies any problems with insomnia. Patient denies excessive thirst, polyuria, polydipsia. Patient denies any swollen glands, patient denies easy bruising or easy bleeding. Patient denies any recent infections, allergies or URI. Patient "s visual fields have not changed significantly in recent time.    PHYSICAL EXAM: BP 119/73   Pulse 79   Temp (!) 96.8 F (36 C)   Resp 20   Wt 207 lb 0.2 oz (93.9 kg)   BMI 33.41 kg/m  No dominant mass or nodularity is noted in either breast in 2 positions examined. No axillary or supraclavicular adenopathy is appreciated. She status post wide local excision of the left breast with incision healed well. Well-developed well-nourished patient in NAD. HEENT reveals PERLA, EOMI, discs not visualized.  Oral cavity is clear. No oral mucosal lesions are identified. Neck is clear without evidence of cervical or supraclavicular adenopathy. Lungs are clear to A&P. Cardiac examination is essentially unremarkable with regular rate and rhythm without murmur rub or thrill. Abdomen is benign with no organomegaly or masses noted. Motor sensory and DTR levels are equal and symmetric in the upper and lower extremities. Cranial nerves II through XII are grossly intact. Proprioception is intact. No peripheral adenopathy or edema is identified. No motor or sensory levels are noted. Crude visual fields are within normal range.  LABORATORY DATA: Pathology reports reviewed    RADIOLOGY RESULTS: Mammogram ultrasound and PET/CT scan reviewed   IMPRESSION: Initial stage IIIa triple negative invasive mammary carcinoma the left breast with complete response to neoadjuvant chemotherapy in 70 year old female  PLAN: At this time I to go ahead with radiation therapy to her left breast and  peripheral lymphatics. I would plan on delivering 5040 cGy in 28 fractions to both her left breast and peripheral lymphatics. I would also boost her scar another 1400 cGy using electron beam. Risks and benefits of treatment including skin reaction fatigue alteration of blood counts slight inclusion of superficial lung and possible lymphedema of her left upper extremity all were discussed in detail. I've asked her to be proactive and exercise left upper extremity is much much is possible.There will be extra effort by both professional staff as well as technical staff to coordinate and manage neoadjuvant chemotherapy and ensuing side effects during her treatments. I personally ordered CT simulation for next week.  I would like to take this opportunity to thank you for allowing me to participate in the care of your patient.Armstead Peaks., MD

## 2015-10-30 ENCOUNTER — Ambulatory Visit
Admission: RE | Admit: 2015-10-30 | Discharge: 2015-10-30 | Disposition: A | Discharge status: Home / Self Care | Payer: Medicare Other | Source: Ambulatory Visit | Attending: Radiation Oncology | Admitting: Radiation Oncology

## 2015-10-30 ENCOUNTER — Encounter: Payer: Self-pay | Admitting: General Surgery

## 2015-10-30 ENCOUNTER — Ambulatory Visit (INDEPENDENT_AMBULATORY_CARE_PROVIDER_SITE_OTHER): Payer: Medicare Other | Admitting: General Surgery

## 2015-10-30 VITALS — BP 128/72 | HR 76 | Resp 14 | Ht 66.0 in | Wt 210.0 lb

## 2015-10-30 DIAGNOSIS — F419 Anxiety disorder, unspecified: Secondary | ICD-10-CM | POA: Diagnosis not present

## 2015-10-30 DIAGNOSIS — D649 Anemia, unspecified: Secondary | ICD-10-CM | POA: Diagnosis not present

## 2015-10-30 DIAGNOSIS — G629 Polyneuropathy, unspecified: Secondary | ICD-10-CM | POA: Diagnosis not present

## 2015-10-30 DIAGNOSIS — C50412 Malignant neoplasm of upper-outer quadrant of left female breast: Secondary | ICD-10-CM

## 2015-10-30 DIAGNOSIS — Z171 Estrogen receptor negative status [ER-]: Secondary | ICD-10-CM | POA: Diagnosis not present

## 2015-10-30 DIAGNOSIS — Z510 Encounter for antineoplastic radiation therapy: Secondary | ICD-10-CM | POA: Diagnosis not present

## 2015-10-30 NOTE — Patient Instructions (Addendum)
The patient is aware to call back for any questions or concerns. Follow up in 5 months with an office visit

## 2015-10-30 NOTE — Progress Notes (Signed)
Patient ID: Suzanne Strong, female   DOB: [birth year], 70 y.o.   MRN: [id number]  Chief Complaint  Patient presents with  . Routine Post Op    HPI Suzanne Strong is a 70 y.o. female.  Here today for postoperative visit, left breast lumpectomy on 10-02-15. She states she is doing well. She is starting radiation 11-11-15.  HPI  Past Medical History:  Diagnosis Date  . Anemia    iron deficiency and B12 deficiency  . Anxiety   . Breast cancer ([place]) 02/25/2015   upper-outer quadrant of left female breast, ER, PR, HER-2 Negative  . Cervical dysplasia    conization  . Diabetes mellitus without complication ([place])   . Diverticulosis   . Fever 04/11/2015  . Gastroparesis    related to previous radiation tx  . GERD (gastroesophageal reflux disease)   . Hyperlipidemia   . Hypertension   . Shingles    Hx of  . 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[place]) 2006    Past Surgical History:  Procedure Laterality Date  . APPENDECTOMY    . BREAST BIOPSY Left 02/25/2015   path pending  . BREAST CYST ASPIRATION    . BREAST LUMPECTOMY WITH NEEDLE LOCALIZATION Left 10/02/2015   Procedure: BREAST LUMPECTOMY WITH NEEDLE LOCALIZATION;  Surgeon: [name], MD;  Location: [facility] OR[facility];  Service: General;  Laterality: Left;  . BREAST LUMPECTOMY WITH SENTINEL LYMPH NODE BIOPSY Left 10/02/2015   Procedure: LEFT BREAST WIDE EXCISION WITH SENTINEL LYMPH NODE BX;  Surgeon: [name], MD;  Location: [facility] OR[facility];  Service: General;  Laterality: Left;  . BREAST SURGERY     breast biopsy benign  . CHOLECYSTECTOMY    . ESOPHAGOGASTRODUODENOSCOPY  07/2009   normal with few gastric polyps  . MOLE REMOVAL    . PORT-A-CATH REMOVAL    . PORTACATH PLACEMENT    . PORTACATH PLACEMENT Right 03/12/2015   Procedure: INSERTION PORT-A-CATH;  Surgeon: [name], MD;  Location: [facility] OR[facility];  Service: General;  Laterality: Right;  . RADICAL NECK DISSECTION    . TONSILLECTOMY     cancer treated with chemo and radiation    Family  History  Problem Relation Age of Onset  . Osteoporosis Mother   . Hyperlipidemia Mother   . Depression Mother   . Cancer Mother     skin cancer ? basal cell and lung ca heavy smoker  . Alcohol abuse Father   . Cancer Father     skin CA ? basal cell  . Heart disease Father     CHF  . Hyperlipidemia Brother   . Hypertension Brother     Social History Social History  Substance Use Topics  . Smoking status: Former Smoker    Packs/day: 0.50    Years: 6.00    Types: Cigarettes    Quit date: 03/10/1984  . Smokeless tobacco: Never Used  . Alcohol use No    Allergies  Allergen Reactions  . Amoxicillin     REACTION: rash  . Fluconazole     REACTION: hives    Current Outpatient Prescriptions  Medication Sig Dispense Refill  . ALPRAZolam (XANAX) 0.5 MG tablet Take 1 tablet (0.5 mg total) by mouth at bedtime as needed for sleep. 30 tablet 3  . atorvastatin (LIPITOR) 10 MG tablet Take 1 tablet (10 mg total) by mouth daily. (Patient taking differently: Take 10 mg by mouth every morning. ) 30 tablet 2  . b complex vitamins tablet Take 1 tablet by mouth daily.      [name]  cyanocobalamin (,VITAMIN B-12,) 1000 MCG/ML injection Inject 1,000 mcg into the muscle every 30 (thirty) days.      . ferrous sulfate 325 (65 FE) MG tablet Take 325 mg by mouth daily with breakfast. Reported on 08/27/2015    . fluticasone (FLONASE) 50 MCG/ACT nasal spray instill 2 sprays into each nostril once daily 16 g 5  . gabapentin (NEURONTIN) 300 MG capsule Take 1 pill in am; and 2 pills in PM 90 capsule 3  . glipiZIDE (GLUCOTROL) 10 MG tablet Take 20 mg by mouth daily before breakfast. Take 2 tabs daily on the day of chemotherapy and the day after chemo    . JANUMET XR 50-1000 MG TB24 Take 1 tablet by mouth 2 (two) times daily.   0  . lidocaine-prilocaine (EMLA) cream Apply 1 application topically as needed. Apply to port when going through Chemo    . lisinopril (PRINIVIL,ZESTRIL) 5 MG tablet Take 1 tablet (5 mg  total) by mouth daily. (Patient taking differently: Take 5 mg by mouth at bedtime. ) 30 tablet 2  . mirtazapine (REMERON) 30 MG tablet take 2 tablet by mouth at bedtime  0  . omeprazole (PRILOSEC) 20 MG capsule take 1 capsule by mouth once daily (Patient taking differently: take 1 capsule by mouth once daily-bedtime) 30 capsule 2  . ondansetron (ZOFRAN) 8 MG tablet take 1 tablet by mouth twice a day (START ON THE 3RD DAY AFTER CHEMOTHERAPY) 30 tablet 1  . potassium chloride (K-DUR) 10 MEQ tablet take 1 tablet by mouth once daily 30 tablet 2  . rOPINIRole (REQUIP) 1 MG tablet 1 pill in the am as needed by mouth and 3 pills at bedtime by mouth (Patient taking differently: Take 1 mg by mouth See admin instructions. 1 pill in the am as needed by mouth and 3 pills at bedtime by mouth) 120 tablet 2  . sertraline (ZOLOFT) 100 MG tablet Take 200 mg by mouth every morning. Reported on 08/27/2015  0  . SLOW-MAG 71.5-119 MG TBEC SR tablet take 1 tablet by mouth daily 30 tablet 2   No current facility-administered medications for this visit.    Facility-Administered Medications Ordered in Other Visits  Medication Dose Route Frequency Provider Last Rate Last Dose  . sodium chloride flush (NS) 0.9 % injection 10 mL  10 mL Intravenous PRN Cammie Sickle, MD   10 mL at 09/11/15 0845    Review of Systems Review of Systems  Constitutional: Negative.   Respiratory: Negative.   Cardiovascular: Negative.     Blood pressure 128/72, pulse 76, resp. rate 14, height 5' 6" (1.676 m), weight 210 lb (95.3 kg).  Physical Exam Physical Exam  Constitutional: She is oriented to person, place, and time. She appears well-developed and well-nourished.  Pulmonary/Chest:    Left breast marked for radiation  Musculoskeletal:       Arms: Neurological: She is alert and oriented to person, place, and time.  Skin: Skin is warm and dry.  Psychiatric: Her behavior is normal.    Data Reviewed Pathologic complete  response on resection. T3 preop.  Assessment    Doing well status post wide excision.  Whole breast radiation pending.    Plan         Follow up in 5 months with an office visit. The patient is aware to call back for any questions or concerns.   This information has been scribed by Karie Fetch RN, BSN,BC.    [name] 10/31/2015, 8:18 AM

## 2015-11-04 DIAGNOSIS — D649 Anemia, unspecified: Secondary | ICD-10-CM | POA: Diagnosis not present

## 2015-11-04 DIAGNOSIS — G629 Polyneuropathy, unspecified: Secondary | ICD-10-CM | POA: Diagnosis not present

## 2015-11-04 DIAGNOSIS — C50412 Malignant neoplasm of upper-outer quadrant of left female breast: Secondary | ICD-10-CM | POA: Diagnosis not present

## 2015-11-04 DIAGNOSIS — F419 Anxiety disorder, unspecified: Secondary | ICD-10-CM | POA: Diagnosis not present

## 2015-11-04 DIAGNOSIS — Z171 Estrogen receptor negative status [ER-]: Secondary | ICD-10-CM | POA: Diagnosis not present

## 2015-11-04 DIAGNOSIS — Z510 Encounter for antineoplastic radiation therapy: Secondary | ICD-10-CM | POA: Diagnosis not present

## 2015-11-06 ENCOUNTER — Other Ambulatory Visit: Payer: Self-pay | Admitting: *Deleted

## 2015-11-06 DIAGNOSIS — C50412 Malignant neoplasm of upper-outer quadrant of left female breast: Secondary | ICD-10-CM

## 2015-11-07 ENCOUNTER — Ambulatory Visit
Admission: RE | Admit: 2015-11-07 | Discharge: 2015-11-07 | Disposition: A | Discharge status: Home / Self Care | Payer: Medicare Other | Source: Ambulatory Visit | Attending: Radiation Oncology | Admitting: Radiation Oncology

## 2015-11-07 DIAGNOSIS — C50412 Malignant neoplasm of upper-outer quadrant of left female breast: Secondary | ICD-10-CM | POA: Diagnosis not present

## 2015-11-07 DIAGNOSIS — F419 Anxiety disorder, unspecified: Secondary | ICD-10-CM | POA: Diagnosis not present

## 2015-11-07 DIAGNOSIS — D649 Anemia, unspecified: Secondary | ICD-10-CM | POA: Diagnosis not present

## 2015-11-07 DIAGNOSIS — Z510 Encounter for antineoplastic radiation therapy: Secondary | ICD-10-CM | POA: Diagnosis not present

## 2015-11-07 DIAGNOSIS — G629 Polyneuropathy, unspecified: Secondary | ICD-10-CM | POA: Diagnosis not present

## 2015-11-07 DIAGNOSIS — Z171 Estrogen receptor negative status [ER-]: Secondary | ICD-10-CM | POA: Diagnosis not present

## 2015-11-11 ENCOUNTER — Ambulatory Visit
Admission: RE | Admit: 2015-11-11 | Discharge: 2015-11-11 | Disposition: A | Discharge status: Home / Self Care | Payer: Medicare Other | Source: Ambulatory Visit | Attending: Radiation Oncology | Admitting: Radiation Oncology

## 2015-11-11 DIAGNOSIS — F419 Anxiety disorder, unspecified: Secondary | ICD-10-CM | POA: Diagnosis not present

## 2015-11-11 DIAGNOSIS — D649 Anemia, unspecified: Secondary | ICD-10-CM | POA: Diagnosis not present

## 2015-11-11 DIAGNOSIS — Z171 Estrogen receptor negative status [ER-]: Secondary | ICD-10-CM | POA: Diagnosis not present

## 2015-11-11 DIAGNOSIS — Z510 Encounter for antineoplastic radiation therapy: Secondary | ICD-10-CM | POA: Diagnosis not present

## 2015-11-11 DIAGNOSIS — C50412 Malignant neoplasm of upper-outer quadrant of left female breast: Secondary | ICD-10-CM | POA: Diagnosis not present

## 2015-11-11 DIAGNOSIS — G629 Polyneuropathy, unspecified: Secondary | ICD-10-CM | POA: Diagnosis not present

## 2015-11-12 ENCOUNTER — Ambulatory Visit
Admission: RE | Admit: 2015-11-12 | Discharge: 2015-11-12 | Disposition: A | Discharge status: Home / Self Care | Payer: Medicare Other | Source: Ambulatory Visit | Attending: Radiation Oncology | Admitting: Radiation Oncology

## 2015-11-12 DIAGNOSIS — D649 Anemia, unspecified: Secondary | ICD-10-CM | POA: Diagnosis not present

## 2015-11-12 DIAGNOSIS — Z171 Estrogen receptor negative status [ER-]: Secondary | ICD-10-CM | POA: Diagnosis not present

## 2015-11-12 DIAGNOSIS — Z510 Encounter for antineoplastic radiation therapy: Secondary | ICD-10-CM | POA: Diagnosis not present

## 2015-11-12 DIAGNOSIS — C50412 Malignant neoplasm of upper-outer quadrant of left female breast: Secondary | ICD-10-CM | POA: Diagnosis not present

## 2015-11-12 DIAGNOSIS — F419 Anxiety disorder, unspecified: Secondary | ICD-10-CM | POA: Diagnosis not present

## 2015-11-12 DIAGNOSIS — G629 Polyneuropathy, unspecified: Secondary | ICD-10-CM | POA: Diagnosis not present

## 2015-11-13 ENCOUNTER — Ambulatory Visit
Admission: RE | Admit: 2015-11-13 | Discharge: 2015-11-13 | Disposition: A | Discharge status: Home / Self Care | Payer: Medicare Other | Source: Ambulatory Visit | Attending: Radiation Oncology | Admitting: Radiation Oncology

## 2015-11-13 DIAGNOSIS — C50412 Malignant neoplasm of upper-outer quadrant of left female breast: Secondary | ICD-10-CM | POA: Diagnosis not present

## 2015-11-13 DIAGNOSIS — G629 Polyneuropathy, unspecified: Secondary | ICD-10-CM | POA: Diagnosis not present

## 2015-11-13 DIAGNOSIS — Z171 Estrogen receptor negative status [ER-]: Secondary | ICD-10-CM | POA: Diagnosis not present

## 2015-11-13 DIAGNOSIS — F419 Anxiety disorder, unspecified: Secondary | ICD-10-CM | POA: Diagnosis not present

## 2015-11-13 DIAGNOSIS — D649 Anemia, unspecified: Secondary | ICD-10-CM | POA: Diagnosis not present

## 2015-11-13 DIAGNOSIS — Z510 Encounter for antineoplastic radiation therapy: Secondary | ICD-10-CM | POA: Diagnosis not present

## 2015-11-14 ENCOUNTER — Ambulatory Visit
Admission: RE | Admit: 2015-11-14 | Discharge: 2015-11-14 | Disposition: A | Discharge status: Home / Self Care | Payer: Medicare Other | Source: Ambulatory Visit | Attending: Radiation Oncology | Admitting: Radiation Oncology

## 2015-11-14 DIAGNOSIS — Z510 Encounter for antineoplastic radiation therapy: Secondary | ICD-10-CM | POA: Diagnosis not present

## 2015-11-14 DIAGNOSIS — D649 Anemia, unspecified: Secondary | ICD-10-CM | POA: Diagnosis not present

## 2015-11-14 DIAGNOSIS — Z171 Estrogen receptor negative status [ER-]: Secondary | ICD-10-CM | POA: Diagnosis not present

## 2015-11-14 DIAGNOSIS — F419 Anxiety disorder, unspecified: Secondary | ICD-10-CM | POA: Diagnosis not present

## 2015-11-14 DIAGNOSIS — C50412 Malignant neoplasm of upper-outer quadrant of left female breast: Secondary | ICD-10-CM | POA: Diagnosis not present

## 2015-11-14 DIAGNOSIS — G629 Polyneuropathy, unspecified: Secondary | ICD-10-CM | POA: Diagnosis not present

## 2015-11-15 ENCOUNTER — Ambulatory Visit
Admission: RE | Admit: 2015-11-15 | Discharge: 2015-11-15 | Disposition: A | Discharge status: Home / Self Care | Payer: Medicare Other | Source: Ambulatory Visit | Attending: Radiation Oncology | Admitting: Radiation Oncology

## 2015-11-15 ENCOUNTER — Other Ambulatory Visit: Payer: Self-pay | Admitting: Family Medicine

## 2015-11-15 DIAGNOSIS — F419 Anxiety disorder, unspecified: Secondary | ICD-10-CM | POA: Diagnosis not present

## 2015-11-15 DIAGNOSIS — Z171 Estrogen receptor negative status [ER-]: Secondary | ICD-10-CM | POA: Diagnosis not present

## 2015-11-15 DIAGNOSIS — Z510 Encounter for antineoplastic radiation therapy: Secondary | ICD-10-CM | POA: Diagnosis not present

## 2015-11-15 DIAGNOSIS — D649 Anemia, unspecified: Secondary | ICD-10-CM | POA: Diagnosis not present

## 2015-11-15 DIAGNOSIS — C50412 Malignant neoplasm of upper-outer quadrant of left female breast: Secondary | ICD-10-CM | POA: Diagnosis not present

## 2015-11-15 DIAGNOSIS — G629 Polyneuropathy, unspecified: Secondary | ICD-10-CM | POA: Diagnosis not present

## 2015-11-18 ENCOUNTER — Inpatient Hospital Stay: Payer: Medicare Other | Attending: Internal Medicine

## 2015-11-18 ENCOUNTER — Ambulatory Visit
Admission: RE | Admit: 2015-11-18 | Discharge: 2015-11-18 | Disposition: A | Discharge status: Home / Self Care | Payer: Medicare Other | Source: Ambulatory Visit | Attending: Radiation Oncology | Admitting: Radiation Oncology

## 2015-11-18 DIAGNOSIS — Z9221 Personal history of antineoplastic chemotherapy: Secondary | ICD-10-CM | POA: Insufficient documentation

## 2015-11-18 DIAGNOSIS — Z7984 Long term (current) use of oral hypoglycemic drugs: Secondary | ICD-10-CM | POA: Diagnosis not present

## 2015-11-18 DIAGNOSIS — Z79899 Other long term (current) drug therapy: Secondary | ICD-10-CM | POA: Diagnosis not present

## 2015-11-18 DIAGNOSIS — Z85828 Personal history of other malignant neoplasm of skin: Secondary | ICD-10-CM | POA: Diagnosis not present

## 2015-11-18 DIAGNOSIS — Z510 Encounter for antineoplastic radiation therapy: Secondary | ICD-10-CM | POA: Diagnosis not present

## 2015-11-18 DIAGNOSIS — Z923 Personal history of irradiation: Secondary | ICD-10-CM | POA: Diagnosis not present

## 2015-11-18 DIAGNOSIS — F419 Anxiety disorder, unspecified: Secondary | ICD-10-CM | POA: Insufficient documentation

## 2015-11-18 DIAGNOSIS — Z85819 Personal history of malignant neoplasm of unspecified site of lip, oral cavity, and pharynx: Secondary | ICD-10-CM | POA: Insufficient documentation

## 2015-11-18 DIAGNOSIS — Z171 Estrogen receptor negative status [ER-]: Secondary | ICD-10-CM | POA: Insufficient documentation

## 2015-11-18 DIAGNOSIS — K5790 Diverticulosis of intestine, part unspecified, without perforation or abscess without bleeding: Secondary | ICD-10-CM | POA: Insufficient documentation

## 2015-11-18 DIAGNOSIS — G629 Polyneuropathy, unspecified: Secondary | ICD-10-CM | POA: Insufficient documentation

## 2015-11-18 DIAGNOSIS — N879 Dysplasia of cervix uteri, unspecified: Secondary | ICD-10-CM | POA: Insufficient documentation

## 2015-11-18 DIAGNOSIS — Z809 Family history of malignant neoplasm, unspecified: Secondary | ICD-10-CM | POA: Diagnosis not present

## 2015-11-18 DIAGNOSIS — Z87891 Personal history of nicotine dependence: Secondary | ICD-10-CM | POA: Insufficient documentation

## 2015-11-18 DIAGNOSIS — E119 Type 2 diabetes mellitus without complications: Secondary | ICD-10-CM | POA: Insufficient documentation

## 2015-11-18 DIAGNOSIS — C50412 Malignant neoplasm of upper-outer quadrant of left female breast: Secondary | ICD-10-CM | POA: Diagnosis not present

## 2015-11-18 DIAGNOSIS — E538 Deficiency of other specified B group vitamins: Secondary | ICD-10-CM | POA: Insufficient documentation

## 2015-11-18 DIAGNOSIS — E785 Hyperlipidemia, unspecified: Secondary | ICD-10-CM | POA: Diagnosis not present

## 2015-11-18 DIAGNOSIS — D509 Iron deficiency anemia, unspecified: Secondary | ICD-10-CM | POA: Insufficient documentation

## 2015-11-18 DIAGNOSIS — K3184 Gastroparesis: Secondary | ICD-10-CM | POA: Diagnosis not present

## 2015-11-18 DIAGNOSIS — D649 Anemia, unspecified: Secondary | ICD-10-CM | POA: Diagnosis not present

## 2015-11-18 DIAGNOSIS — I10 Essential (primary) hypertension: Secondary | ICD-10-CM | POA: Diagnosis not present

## 2015-11-18 DIAGNOSIS — K219 Gastro-esophageal reflux disease without esophagitis: Secondary | ICD-10-CM | POA: Diagnosis not present

## 2015-11-18 LAB — CBC
HCT: 32.1 % — ABNORMAL LOW (ref 35.0–47.0)
Hemoglobin: 10.8 g/dL — ABNORMAL LOW (ref 12.0–16.0)
MCH: 29.8 pg (ref 26.0–34.0)
MCHC: 33.6 g/dL (ref 32.0–36.0)
MCV: 88.8 fL (ref 80.0–100.0)
PLATELETS: 209 10*3/uL (ref 150–440)
RBC: 3.61 MIL/uL — ABNORMAL LOW (ref 3.80–5.20)
RDW: 15.9 % — AB (ref 11.5–14.5)
WBC: 5.1 10*3/uL (ref 3.6–11.0)

## 2015-11-19 ENCOUNTER — Ambulatory Visit
Admission: RE | Admit: 2015-11-19 | Discharge: 2015-11-19 | Disposition: A | Discharge status: Home / Self Care | Payer: Medicare Other | Source: Ambulatory Visit | Attending: Radiation Oncology | Admitting: Radiation Oncology

## 2015-11-19 DIAGNOSIS — Z171 Estrogen receptor negative status [ER-]: Secondary | ICD-10-CM | POA: Diagnosis not present

## 2015-11-19 DIAGNOSIS — C50412 Malignant neoplasm of upper-outer quadrant of left female breast: Secondary | ICD-10-CM | POA: Diagnosis not present

## 2015-11-19 DIAGNOSIS — Z510 Encounter for antineoplastic radiation therapy: Secondary | ICD-10-CM | POA: Diagnosis not present

## 2015-11-19 DIAGNOSIS — F419 Anxiety disorder, unspecified: Secondary | ICD-10-CM | POA: Diagnosis not present

## 2015-11-19 DIAGNOSIS — G629 Polyneuropathy, unspecified: Secondary | ICD-10-CM | POA: Diagnosis not present

## 2015-11-19 DIAGNOSIS — D649 Anemia, unspecified: Secondary | ICD-10-CM | POA: Diagnosis not present

## 2015-11-20 ENCOUNTER — Ambulatory Visit
Admission: RE | Admit: 2015-11-20 | Discharge: 2015-11-20 | Disposition: A | Discharge status: Home / Self Care | Payer: Medicare Other | Source: Ambulatory Visit | Attending: Radiation Oncology | Admitting: Radiation Oncology

## 2015-11-20 DIAGNOSIS — F419 Anxiety disorder, unspecified: Secondary | ICD-10-CM | POA: Diagnosis not present

## 2015-11-20 DIAGNOSIS — Z510 Encounter for antineoplastic radiation therapy: Secondary | ICD-10-CM | POA: Diagnosis not present

## 2015-11-20 DIAGNOSIS — G629 Polyneuropathy, unspecified: Secondary | ICD-10-CM | POA: Diagnosis not present

## 2015-11-20 DIAGNOSIS — Z171 Estrogen receptor negative status [ER-]: Secondary | ICD-10-CM | POA: Diagnosis not present

## 2015-11-20 DIAGNOSIS — C50412 Malignant neoplasm of upper-outer quadrant of left female breast: Secondary | ICD-10-CM | POA: Diagnosis not present

## 2015-11-20 DIAGNOSIS — D649 Anemia, unspecified: Secondary | ICD-10-CM | POA: Diagnosis not present

## 2015-11-21 ENCOUNTER — Inpatient Hospital Stay: Payer: Medicare Other

## 2015-11-21 ENCOUNTER — Ambulatory Visit
Admission: RE | Admit: 2015-11-21 | Discharge: 2015-11-21 | Disposition: A | Discharge status: Home / Self Care | Payer: Medicare Other | Source: Ambulatory Visit | Attending: Radiation Oncology | Admitting: Radiation Oncology

## 2015-11-21 ENCOUNTER — Inpatient Hospital Stay (HOSPITAL_BASED_OUTPATIENT_CLINIC_OR_DEPARTMENT_OTHER): Payer: Medicare Other | Admitting: Internal Medicine

## 2015-11-21 DIAGNOSIS — Z510 Encounter for antineoplastic radiation therapy: Secondary | ICD-10-CM | POA: Diagnosis not present

## 2015-11-21 DIAGNOSIS — E538 Deficiency of other specified B group vitamins: Secondary | ICD-10-CM

## 2015-11-21 DIAGNOSIS — D649 Anemia, unspecified: Secondary | ICD-10-CM | POA: Diagnosis not present

## 2015-11-21 DIAGNOSIS — G629 Polyneuropathy, unspecified: Secondary | ICD-10-CM | POA: Diagnosis not present

## 2015-11-21 DIAGNOSIS — C50412 Malignant neoplasm of upper-outer quadrant of left female breast: Secondary | ICD-10-CM

## 2015-11-21 DIAGNOSIS — D509 Iron deficiency anemia, unspecified: Secondary | ICD-10-CM | POA: Diagnosis not present

## 2015-11-21 DIAGNOSIS — Z85828 Personal history of other malignant neoplasm of skin: Secondary | ICD-10-CM

## 2015-11-21 DIAGNOSIS — K5790 Diverticulosis of intestine, part unspecified, without perforation or abscess without bleeding: Secondary | ICD-10-CM

## 2015-11-21 DIAGNOSIS — Z171 Estrogen receptor negative status [ER-]: Secondary | ICD-10-CM | POA: Diagnosis not present

## 2015-11-21 DIAGNOSIS — Z923 Personal history of irradiation: Secondary | ICD-10-CM

## 2015-11-21 DIAGNOSIS — E119 Type 2 diabetes mellitus without complications: Secondary | ICD-10-CM

## 2015-11-21 DIAGNOSIS — Z79899 Other long term (current) drug therapy: Secondary | ICD-10-CM

## 2015-11-21 DIAGNOSIS — Z9221 Personal history of antineoplastic chemotherapy: Secondary | ICD-10-CM

## 2015-11-21 DIAGNOSIS — F419 Anxiety disorder, unspecified: Secondary | ICD-10-CM

## 2015-11-21 DIAGNOSIS — Z7984 Long term (current) use of oral hypoglycemic drugs: Secondary | ICD-10-CM

## 2015-11-21 DIAGNOSIS — Z87891 Personal history of nicotine dependence: Secondary | ICD-10-CM

## 2015-11-21 DIAGNOSIS — N879 Dysplasia of cervix uteri, unspecified: Secondary | ICD-10-CM

## 2015-11-21 DIAGNOSIS — Z85819 Personal history of malignant neoplasm of unspecified site of lip, oral cavity, and pharynx: Secondary | ICD-10-CM

## 2015-11-21 LAB — CBC WITH DIFFERENTIAL/PLATELET
BASOS ABS: 0 10*3/uL (ref 0–0.1)
BASOS PCT: 1 %
EOS PCT: 9 %
Eosinophils Absolute: 0.4 10*3/uL (ref 0–0.7)
HEMATOCRIT: 30.8 % — AB (ref 35.0–47.0)
Hemoglobin: 10.3 g/dL — ABNORMAL LOW (ref 12.0–16.0)
LYMPHS PCT: 11 %
Lymphs Abs: 0.5 10*3/uL — ABNORMAL LOW (ref 1.0–3.6)
MCH: 29.6 pg (ref 26.0–34.0)
MCHC: 33.5 g/dL (ref 32.0–36.0)
MCV: 88.5 fL (ref 80.0–100.0)
MONO ABS: 0.3 10*3/uL (ref 0.2–0.9)
MONOS PCT: 6 %
NEUTROS ABS: 3.4 10*3/uL (ref 1.4–6.5)
NEUTROS PCT: 73 %
PLATELETS: 185 10*3/uL (ref 150–440)
RBC: 3.48 MIL/uL — ABNORMAL LOW (ref 3.80–5.20)
RDW: 15.6 % — AB (ref 11.5–14.5)
WBC: 4.7 10*3/uL (ref 3.6–11.0)

## 2015-11-21 LAB — COMPREHENSIVE METABOLIC PANEL
ALT: 20 U/L (ref 14–54)
ANION GAP: 6 (ref 5–15)
AST: 29 U/L (ref 15–41)
Albumin: 3.6 g/dL (ref 3.5–5.0)
Alkaline Phosphatase: 84 U/L (ref 38–126)
BILIRUBIN TOTAL: 0.4 mg/dL (ref 0.3–1.2)
BUN: 13 mg/dL (ref 6–20)
CHLORIDE: 105 mmol/L (ref 101–111)
CO2: 24 mmol/L (ref 22–32)
Calcium: 8.9 mg/dL (ref 8.9–10.3)
Creatinine, Ser: 0.65 mg/dL (ref 0.44–1.00)
GFR calc Af Amer: >60 mL/min (ref >60)
Glucose, Bld: 134 mg/dL — ABNORMAL HIGH (ref 65–99)
POTASSIUM: 3.9 mmol/L (ref 3.5–5.1)
Sodium: 135 mmol/L (ref 135–145)
TOTAL PROTEIN: 6.9 g/dL (ref 6.5–8.1)

## 2015-11-21 LAB — MAGNESIUM: MAGNESIUM: 1.4 mg/dL — AB (ref 1.7–2.4)

## 2015-11-21 MED ORDER — HEPARIN SOD (PORK) LOCK FLUSH 100 UNIT/ML IV SOLN
INTRAVENOUS | Status: AC
  Filled 2015-11-21: qty 5

## 2015-11-21 MED ORDER — DULOXETINE HCL 20 MG PO CPEP: 20.0000 mg | ORAL_CAPSULE | Freq: Every day | ORAL | 3 refills | Status: DC

## 2015-11-21 MED ORDER — HEPARIN SOD (PORK) LOCK FLUSH 100 UNIT/ML IV SOLN
500.0000 [IU] | Freq: Once | INTRAVENOUS | Status: AC
Start: 2015-11-21 — End: 2015-11-21
  Administered 2015-11-21: 500 [IU] via INTRAVENOUS

## 2015-11-21 NOTE — Progress Notes (Signed)
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 5 CM TUMOR MASS BIOPSIES POSITIVE FOR INVASIVE MAMMARY CARCINOMA TRIPLE NEGATIVE DISEASE(jANUARY, 2017)stage IIIa. ER/PR/her2 Neu-NEGATIVE  # Cytoxan and Adriamycin from March 18, 2015; carbo-taxol x4 cycles  # AUg 18th s/p Lumpec & SLNBx- Complete path CR [ypT0 ypsn0]  3. MUGA scan of the heart shows ejection fraction to be 61% (January, 2017) 4     Cancer of left breast ([place])   03/01/2015 Initial Diagnosis    Cancer of left breast ([place])       Malignant neoplasm of upper-outer quadrant of left female breast ([place])   03/11/2015 Initial Diagnosis    Malignant neoplasm of upper-outer quadrant of left female breast ([place])      INTERVAL HISTORY:  Suzanne Strong 70 y.o.  female pleasant patient above history of stage IIIa breast cancer currently on neoadjuvant chemotherapy Is currently status post lumpectomy with sentinel lymph node biopsy is here for follow-up.  Patient is currently getting radiation. Patient complains of moderate tingling and numbness of extremities mostly at nighttime. Patient is taking Neurontin 300 TID. No chest pain or shortness of breath or cough. Denies any new lumps or bumps. Interested in trying any other medication to help  with her neuropathy.  REVIEW OF SYSTEMS:  A complete 10 point review of system is done which is negative except mentioned above/history of present illness.   PAST MEDICAL HISTORY :  Past Medical History:  Diagnosis Date  . Anemia    iron deficiency and B12 deficiency  . Anxiety   . Breast cancer ([place]) 02/25/2015   upper-outer quadrant of left female breast, ER, PR, HER-2 Negative  . Cervical dysplasia    conization  . Diabetes mellitus without complication ([place])   . Diverticulosis   . Fever 04/11/2015  . Gastroparesis    related to previous radiation tx  . GERD (gastroesophageal reflux disease)   . Hyperlipidemia   . Hypertension   . Shingles    Hx of  . 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 APPENDECTOMY    . BREAST BIOPSY Left 02/25/2015   path pending  . BREAST CYST ASPIRATION    . BREAST LUMPECTOMY WITH NEEDLE LOCALIZATION Left 10/02/2015   Procedure: BREAST LUMPECTOMY WITH NEEDLE LOCALIZATION;  Surgeon: [name], MD;  Location: [facility] ORS;  Service: General;  Laterality: Left;  . BREAST LUMPECTOMY WITH SENTINEL LYMPH NODE BIOPSY Left 10/02/2015   Procedure: LEFT BREAST WIDE EXCISION WITH SENTINEL LYMPH NODE BX;  Surgeon: [name], MD;  Location: [facility] ORS;  Service: General;  Laterality: Left;  . BREAST SURGERY     breast biopsy benign  . CHOLECYSTECTOMY    . ESOPHAGOGASTRODUODENOSCOPY  07/2009  normal with few gastric polyps  . MOLE REMOVAL    . PORT-A-CATH REMOVAL    . PORTACATH PLACEMENT    . PORTACATH PLACEMENT Right 03/12/2015   Procedure: INSERTION PORT-A-CATH;  Surgeon: [name], MD;  Location: [facility] ORS;  Service: General;  Laterality: Right;  . RADICAL NECK DISSECTION    . TONSILLECTOMY     cancer treated with chemo and radiation    FAMILY HISTORY :   Family History  Problem Relation Age of Onset  . Osteoporosis Mother   . Hyperlipidemia Mother   . Depression  Mother   . Cancer Mother     skin cancer ? basal cell and lung ca heavy smoker  . Alcohol abuse Father   . Cancer Father     skin CA ? basal cell  . Heart disease Father     CHF  . Hyperlipidemia Brother   . Hypertension Brother     SOCIAL HISTORY:   Social History  Substance Use Topics  . Smoking status: Former Smoker    Packs/day: 0.50    Years: 6.00    Types: Cigarettes    Quit date: 03/10/1984  . Smokeless tobacco: Never Used  . Alcohol use No    ALLERGIES:  is allergic to amoxicillin and fluconazole.  MEDICATIONS:  Current Outpatient Prescriptions  Medication Sig Dispense Refill  . ALPRAZolam (XANAX) 0.5 MG tablet Take 1 tablet (0.5 mg total) by mouth at bedtime as needed for sleep. 30 tablet 3  . atorvastatin (LIPITOR) 10 MG tablet Take 1 tablet (10 mg total) by mouth daily. (Patient taking differently: Take 10 mg by mouth every morning. ) 30 tablet 2  . b complex vitamins tablet Take 1 tablet by mouth daily.      . cyanocobalamin (,VITAMIN B-12,) 1000 MCG/ML injection Inject 1,000 mcg into the muscle every 30 (thirty) days.      . fluticasone (FLONASE) 50 MCG/ACT nasal spray instill 2 sprays into each nostril once daily 16 g 2  . gabapentin (NEURONTIN) 300 MG capsule Take 1 pill in am; and 2 pills in PM 90 capsule 3  . glipiZIDE (GLUCOTROL) 10 MG tablet Take 20 mg by mouth daily before breakfast. Take 2 tabs daily on the day of chemotherapy and the day after chemo    . JANUMET XR 50-1000 MG TB24 Take 1 tablet by mouth 2 (two) times daily.   0  . lidocaine-prilocaine (EMLA) cream Apply 1 application topically as needed. Apply to port when going through Chemo    . lisinopril (PRINIVIL,ZESTRIL) 5 MG tablet Take 1 tablet (5 mg total) by mouth daily. (Patient taking differently: Take 5 mg by mouth at bedtime. ) 30 tablet 2  . mirtazapine (REMERON) 30 MG tablet take 2 tablet by mouth at bedtime  0  . omeprazole (PRILOSEC) 20 MG capsule take 1 capsule by mouth once daily  (Patient taking differently: take 1 capsule by mouth once daily-bedtime) 30 capsule 2  . potassium chloride (K-DUR) 10 MEQ tablet take 1 tablet by mouth once daily 30 tablet 2  . rOPINIRole (REQUIP) 1 MG tablet 1 pill in the am as needed by mouth and 3 pills at bedtime by mouth (Patient taking differently: Take 1 mg by mouth See admin instructions. 1 pill in the am as needed by mouth and 3 pills at bedtime by mouth) 120 tablet 2  . sertraline (ZOLOFT) 100 MG tablet Take 200 mg by mouth every morning. Reported on 08/27/2015  0  . SLOW-MAG  71.5-119 MG TBEC SR tablet take 1 tablet by mouth daily 30 tablet 2  . DULoxetine (CYMBALTA) 20 MG capsule Take 1 capsule (20 mg total) by mouth daily. 30 capsule 3  . ondansetron (ZOFRAN) 8 MG tablet take 1 tablet by mouth twice a day (START ON THE 3RD DAY AFTER CHEMOTHERAPY) (Patient not taking: Reported on 11/21/2015) 30 tablet 1   No current facility-administered medications for this visit.    Facility-Administered Medications Ordered in Other Visits  Medication Dose Route Frequency Provider Last Rate Last Dose  . sodium chloride flush (NS) 0.9 % injection 10 mL  10 mL Intravenous PRN Cammie Sickle, MD   10 mL at 09/11/15 0845    PHYSICAL EXAMINATION: ECOG PERFORMANCE STATUS: 0 - Asymptomatic  BP 130/79 (Patient Position: Sitting)   Pulse 84   Temp 97.5 F (36.4 C) (Tympanic)   Resp 20   Ht '5\' 6"'$  (1.676 m)   Wt 211 lb (95.7 kg)   BMI 34.06 kg/m   Filed Weights   11/21/15 1403  Weight: 211 lb (95.7 kg)    GENERAL: Well-nourished well-developed; Alert, no distress and comfortable.   Alone. EYES: no pallor or icterus OROPHARYNX: no thrush or ulceration; good dentition  NECK: supple, no masses felt LYMPH:  no palpable lymphadenopathy in the cervical, axillary or inguinal regions LUNGS: clear to auscultation and  No wheeze or crackles HEART/CVS: regular rate & rhythm and no murmurs; No lower extremity edema ABDOMEN:abdomen soft, non-tender  and normal bowel sounds Musculoskeletal:no cyanosis of digits and no clubbing  PSYCH: alert & oriented x 3 with fluent speech NEURO: no focal motor/sensory deficits SKIN:  no rashes or significant lesions  LABORATORY DATA:  I have reviewed the data as listed    Component Value Date/Time   NA 135 11/21/2015 1339   NA 142 03/01/2014 0846   K 3.9 11/21/2015 1339   K 4.3 03/01/2014 0846   CL 105 11/21/2015 1339   CL 102 03/01/2014 0846   CO2 24 11/21/2015 1339   CO2 28 03/01/2014 0846   GLUCOSE 134 (H) 11/21/2015 1339   GLUCOSE 219 (H) 03/01/2014 0846   BUN 13 11/21/2015 1339   BUN 12 03/01/2014 0846   CREATININE 0.65 11/21/2015 1339   CREATININE 0.95 03/01/2014 0846   CALCIUM 8.9 11/21/2015 1339   CALCIUM 8.6 03/01/2014 0846   PROT 6.9 11/21/2015 1339   PROT 7.5 03/01/2014 0846   ALBUMIN 3.6 11/21/2015 1339   ALBUMIN 3.4 03/01/2014 0846   AST 29 11/21/2015 1339   AST 20 03/01/2014 0846   ALT 20 11/21/2015 1339   ALT 28 03/01/2014 0846   ALKPHOS 84 11/21/2015 1339   ALKPHOS 122 (H) 03/01/2014 0846   BILITOT 0.4 11/21/2015 1339   BILITOT 0.2 03/01/2014 0846   GFRNONAA >60 11/21/2015 1339   GFRNONAA >60 03/01/2014 0846   GFRNONAA >60 08/24/2013 1015   GFRAA >60 11/21/2015 1339   GFRAA >60 03/01/2014 0846   GFRAA >60 08/24/2013 1015    No results found for: SPEP, UPEP  Lab Results  Component Value Date   WBC 4.7 11/21/2015   NEUTROABS 3.4 11/21/2015   HGB 10.3 (L) 11/21/2015   HCT 30.8 (L) 11/21/2015   MCV 88.5 11/21/2015   PLT 185 11/21/2015      Chemistry      Component Value Date/Time   NA 135 11/21/2015 1339   NA 142 03/01/2014 0846   K 3.9 11/21/2015 1339   K 4.3 03/01/2014 0846  CL 105 11/21/2015 1339   CL 102 03/01/2014 0846   CO2 24 11/21/2015 1339   CO2 28 03/01/2014 0846   BUN 13 11/21/2015 1339   BUN 12 03/01/2014 0846   CREATININE 0.65 11/21/2015 1339   CREATININE 0.95 03/01/2014 0846      Component Value Date/Time   CALCIUM 8.9  11/21/2015 1339   CALCIUM 8.6 03/01/2014 0846   ALKPHOS 84 11/21/2015 1339   ALKPHOS 122 (H) 03/01/2014 0846   AST 29 11/21/2015 1339   AST 20 03/01/2014 0846   ALT 20 11/21/2015 1339   ALT 28 03/01/2014 0846   BILITOT 0.4 11/21/2015 1339   BILITOT 0.2 03/01/2014 0846       RADIOGRAPHIC STUDIES: I have personally reviewed the radiological images as listed and agreed with the findings in the report. No results found.   ASSESSMENT & PLAN:  Carcinoma of upper-outer quadrant of left breast in female, estrogen receptor negative ([place]) Left breast cancer stage III  ER/PR/her 2 Neu NEGCurrently on neoadjuvant chemotherapy; excellent response. S/p Surgery- complete pathologic response. On RT currently.   # PN- G-2  on 300 three pills a day; add Cymbalta '20mg'$ /dy- discussed potential side effects.   # Hypomagnesemia- 1.4; on home supplementation.  # follow up in 8 weeks/ labs/port flush.    Orders Placed This Encounter  Procedures  . CBC with Differential    Standing Status:   Future    Standing Expiration Date:   11/20/2016  . Basic metabolic panel    Standing Status:   Future    Standing Expiration Date:   11/20/2016  . Magnesium    Standing Status:   Future    Standing Expiration Date:   11/20/2016   All questions were answered. The patient knows to call the clinic with any problems, questions or concerns.      Cammie Sickle, MD 11/21/2015 3:19 PM

## 2015-11-21 NOTE — Assessment & Plan Note (Addendum)
Left breast cancer stage III  ER/PR/her 2 Neu NEGCurrently on neoadjuvant chemotherapy; excellent response. S/p Surgery- complete pathologic response. On RT currently.   # PN- G-2  on 300 three pills a day; add Cymbalta 20mg /dy- discussed potential side effects.   # Hypomagnesemia- 1.4; on home supplementation.  # follow up in 8 weeks/ labs/port flush.

## 2015-11-22 ENCOUNTER — Ambulatory Visit
Admission: RE | Admit: 2015-11-22 | Discharge: 2015-11-22 | Disposition: A | Discharge status: Home / Self Care | Payer: Medicare Other | Source: Ambulatory Visit | Attending: Radiation Oncology | Admitting: Radiation Oncology

## 2015-11-22 DIAGNOSIS — D649 Anemia, unspecified: Secondary | ICD-10-CM | POA: Diagnosis not present

## 2015-11-22 DIAGNOSIS — C50412 Malignant neoplasm of upper-outer quadrant of left female breast: Secondary | ICD-10-CM | POA: Diagnosis not present

## 2015-11-22 DIAGNOSIS — Z171 Estrogen receptor negative status [ER-]: Secondary | ICD-10-CM | POA: Diagnosis not present

## 2015-11-22 DIAGNOSIS — G629 Polyneuropathy, unspecified: Secondary | ICD-10-CM | POA: Diagnosis not present

## 2015-11-22 DIAGNOSIS — F419 Anxiety disorder, unspecified: Secondary | ICD-10-CM | POA: Diagnosis not present

## 2015-11-22 DIAGNOSIS — Z510 Encounter for antineoplastic radiation therapy: Secondary | ICD-10-CM | POA: Diagnosis not present

## 2015-11-25 ENCOUNTER — Ambulatory Visit
Admission: RE | Admit: 2015-11-25 | Discharge: 2015-11-25 | Disposition: A | Discharge status: Home / Self Care | Payer: Medicare Other | Source: Ambulatory Visit | Attending: Radiation Oncology | Admitting: Radiation Oncology

## 2015-11-25 DIAGNOSIS — Z171 Estrogen receptor negative status [ER-]: Secondary | ICD-10-CM | POA: Diagnosis not present

## 2015-11-25 DIAGNOSIS — D649 Anemia, unspecified: Secondary | ICD-10-CM | POA: Diagnosis not present

## 2015-11-25 DIAGNOSIS — G629 Polyneuropathy, unspecified: Secondary | ICD-10-CM | POA: Diagnosis not present

## 2015-11-25 DIAGNOSIS — Z510 Encounter for antineoplastic radiation therapy: Secondary | ICD-10-CM | POA: Diagnosis not present

## 2015-11-25 DIAGNOSIS — F419 Anxiety disorder, unspecified: Secondary | ICD-10-CM | POA: Diagnosis not present

## 2015-11-25 DIAGNOSIS — C50412 Malignant neoplasm of upper-outer quadrant of left female breast: Secondary | ICD-10-CM | POA: Diagnosis not present

## 2015-11-26 ENCOUNTER — Ambulatory Visit
Admission: RE | Admit: 2015-11-26 | Discharge: 2015-11-26 | Disposition: A | Discharge status: Home / Self Care | Payer: Medicare Other | Source: Ambulatory Visit | Attending: Radiation Oncology | Admitting: Radiation Oncology

## 2015-11-26 DIAGNOSIS — C50412 Malignant neoplasm of upper-outer quadrant of left female breast: Secondary | ICD-10-CM | POA: Diagnosis not present

## 2015-11-26 DIAGNOSIS — Z510 Encounter for antineoplastic radiation therapy: Secondary | ICD-10-CM | POA: Diagnosis not present

## 2015-11-26 DIAGNOSIS — F419 Anxiety disorder, unspecified: Secondary | ICD-10-CM | POA: Diagnosis not present

## 2015-11-26 DIAGNOSIS — G629 Polyneuropathy, unspecified: Secondary | ICD-10-CM | POA: Diagnosis not present

## 2015-11-26 DIAGNOSIS — D649 Anemia, unspecified: Secondary | ICD-10-CM | POA: Diagnosis not present

## 2015-11-26 DIAGNOSIS — Z171 Estrogen receptor negative status [ER-]: Secondary | ICD-10-CM | POA: Diagnosis not present

## 2015-11-27 ENCOUNTER — Ambulatory Visit
Admission: RE | Admit: 2015-11-27 | Discharge: 2015-11-27 | Disposition: A | Discharge status: Home / Self Care | Payer: Medicare Other | Source: Ambulatory Visit | Attending: Radiation Oncology | Admitting: Radiation Oncology

## 2015-11-27 DIAGNOSIS — C50412 Malignant neoplasm of upper-outer quadrant of left female breast: Secondary | ICD-10-CM | POA: Diagnosis not present

## 2015-11-27 DIAGNOSIS — Z171 Estrogen receptor negative status [ER-]: Secondary | ICD-10-CM | POA: Diagnosis not present

## 2015-11-27 DIAGNOSIS — F419 Anxiety disorder, unspecified: Secondary | ICD-10-CM | POA: Diagnosis not present

## 2015-11-27 DIAGNOSIS — G629 Polyneuropathy, unspecified: Secondary | ICD-10-CM | POA: Diagnosis not present

## 2015-11-27 DIAGNOSIS — D649 Anemia, unspecified: Secondary | ICD-10-CM | POA: Diagnosis not present

## 2015-11-27 DIAGNOSIS — Z510 Encounter for antineoplastic radiation therapy: Secondary | ICD-10-CM | POA: Diagnosis not present

## 2015-11-28 ENCOUNTER — Ambulatory Visit
Admission: RE | Admit: 2015-11-28 | Discharge: 2015-11-28 | Disposition: A | Discharge status: Home / Self Care | Payer: Medicare Other | Source: Ambulatory Visit | Attending: Radiation Oncology | Admitting: Radiation Oncology

## 2015-11-28 DIAGNOSIS — F419 Anxiety disorder, unspecified: Secondary | ICD-10-CM | POA: Diagnosis not present

## 2015-11-28 DIAGNOSIS — D649 Anemia, unspecified: Secondary | ICD-10-CM | POA: Diagnosis not present

## 2015-11-28 DIAGNOSIS — G629 Polyneuropathy, unspecified: Secondary | ICD-10-CM | POA: Diagnosis not present

## 2015-11-28 DIAGNOSIS — C50412 Malignant neoplasm of upper-outer quadrant of left female breast: Secondary | ICD-10-CM | POA: Diagnosis not present

## 2015-11-28 DIAGNOSIS — Z510 Encounter for antineoplastic radiation therapy: Secondary | ICD-10-CM | POA: Diagnosis not present

## 2015-11-28 DIAGNOSIS — Z171 Estrogen receptor negative status [ER-]: Secondary | ICD-10-CM | POA: Diagnosis not present

## 2015-11-29 ENCOUNTER — Ambulatory Visit
Admission: RE | Admit: 2015-11-29 | Discharge: 2015-11-29 | Disposition: A | Discharge status: Home / Self Care | Payer: Medicare Other | Source: Ambulatory Visit | Attending: Radiation Oncology | Admitting: Radiation Oncology

## 2015-11-29 DIAGNOSIS — F419 Anxiety disorder, unspecified: Secondary | ICD-10-CM | POA: Diagnosis not present

## 2015-11-29 DIAGNOSIS — Z171 Estrogen receptor negative status [ER-]: Secondary | ICD-10-CM | POA: Diagnosis not present

## 2015-11-29 DIAGNOSIS — D649 Anemia, unspecified: Secondary | ICD-10-CM | POA: Diagnosis not present

## 2015-11-29 DIAGNOSIS — C50412 Malignant neoplasm of upper-outer quadrant of left female breast: Secondary | ICD-10-CM | POA: Diagnosis not present

## 2015-11-29 DIAGNOSIS — G629 Polyneuropathy, unspecified: Secondary | ICD-10-CM | POA: Diagnosis not present

## 2015-11-29 DIAGNOSIS — Z510 Encounter for antineoplastic radiation therapy: Secondary | ICD-10-CM | POA: Diagnosis not present

## 2015-12-02 ENCOUNTER — Other Ambulatory Visit: Payer: Self-pay

## 2015-12-02 ENCOUNTER — Ambulatory Visit
Admission: RE | Admit: 2015-12-02 | Discharge: 2015-12-02 | Disposition: A | Discharge status: Home / Self Care | Payer: Medicare Other | Source: Ambulatory Visit | Attending: Radiation Oncology | Admitting: Radiation Oncology

## 2015-12-02 ENCOUNTER — Inpatient Hospital Stay: Payer: Medicare Other

## 2015-12-02 DIAGNOSIS — Z171 Estrogen receptor negative status [ER-]: Secondary | ICD-10-CM | POA: Diagnosis not present

## 2015-12-02 DIAGNOSIS — C50412 Malignant neoplasm of upper-outer quadrant of left female breast: Secondary | ICD-10-CM | POA: Diagnosis not present

## 2015-12-02 DIAGNOSIS — F419 Anxiety disorder, unspecified: Secondary | ICD-10-CM | POA: Diagnosis not present

## 2015-12-02 DIAGNOSIS — Z510 Encounter for antineoplastic radiation therapy: Secondary | ICD-10-CM | POA: Diagnosis not present

## 2015-12-02 DIAGNOSIS — D649 Anemia, unspecified: Secondary | ICD-10-CM | POA: Diagnosis not present

## 2015-12-02 DIAGNOSIS — D509 Iron deficiency anemia, unspecified: Secondary | ICD-10-CM | POA: Diagnosis not present

## 2015-12-02 DIAGNOSIS — G629 Polyneuropathy, unspecified: Secondary | ICD-10-CM | POA: Diagnosis not present

## 2015-12-02 DIAGNOSIS — E538 Deficiency of other specified B group vitamins: Secondary | ICD-10-CM | POA: Diagnosis not present

## 2015-12-02 LAB — CBC
HEMATOCRIT: 32.2 % — AB (ref 35.0–47.0)
Hemoglobin: 11 g/dL — ABNORMAL LOW (ref 12.0–16.0)
MCH: 30.1 pg (ref 26.0–34.0)
MCHC: 34 g/dL (ref 32.0–36.0)
MCV: 88.6 fL (ref 80.0–100.0)
PLATELETS: 172 10*3/uL (ref 150–440)
RBC: 3.64 MIL/uL — AB (ref 3.80–5.20)
RDW: 15.5 % — ABNORMAL HIGH (ref 11.5–14.5)
WBC: 4.2 10*3/uL (ref 3.6–11.0)

## 2015-12-03 ENCOUNTER — Ambulatory Visit
Admission: RE | Admit: 2015-12-03 | Discharge: 2015-12-03 | Disposition: A | Discharge status: Home / Self Care | Payer: Medicare Other | Source: Ambulatory Visit | Attending: Radiation Oncology | Admitting: Radiation Oncology

## 2015-12-03 DIAGNOSIS — Z510 Encounter for antineoplastic radiation therapy: Secondary | ICD-10-CM | POA: Diagnosis not present

## 2015-12-03 DIAGNOSIS — G629 Polyneuropathy, unspecified: Secondary | ICD-10-CM | POA: Diagnosis not present

## 2015-12-03 DIAGNOSIS — F419 Anxiety disorder, unspecified: Secondary | ICD-10-CM | POA: Diagnosis not present

## 2015-12-03 DIAGNOSIS — C50412 Malignant neoplasm of upper-outer quadrant of left female breast: Secondary | ICD-10-CM | POA: Diagnosis not present

## 2015-12-03 DIAGNOSIS — D649 Anemia, unspecified: Secondary | ICD-10-CM | POA: Diagnosis not present

## 2015-12-03 DIAGNOSIS — Z171 Estrogen receptor negative status [ER-]: Secondary | ICD-10-CM | POA: Diagnosis not present

## 2015-12-04 ENCOUNTER — Ambulatory Visit
Admission: RE | Admit: 2015-12-04 | Discharge: 2015-12-04 | Disposition: A | Discharge status: Home / Self Care | Payer: Medicare Other | Source: Ambulatory Visit | Attending: Radiation Oncology | Admitting: Radiation Oncology

## 2015-12-04 DIAGNOSIS — C50412 Malignant neoplasm of upper-outer quadrant of left female breast: Secondary | ICD-10-CM | POA: Diagnosis not present

## 2015-12-04 DIAGNOSIS — Z171 Estrogen receptor negative status [ER-]: Secondary | ICD-10-CM | POA: Diagnosis not present

## 2015-12-04 DIAGNOSIS — D649 Anemia, unspecified: Secondary | ICD-10-CM | POA: Diagnosis not present

## 2015-12-04 DIAGNOSIS — F419 Anxiety disorder, unspecified: Secondary | ICD-10-CM | POA: Diagnosis not present

## 2015-12-04 DIAGNOSIS — Z510 Encounter for antineoplastic radiation therapy: Secondary | ICD-10-CM | POA: Diagnosis not present

## 2015-12-04 DIAGNOSIS — G629 Polyneuropathy, unspecified: Secondary | ICD-10-CM | POA: Diagnosis not present

## 2015-12-05 ENCOUNTER — Ambulatory Visit
Admission: RE | Admit: 2015-12-05 | Discharge: 2015-12-05 | Disposition: A | Discharge status: Home / Self Care | Payer: Medicare Other | Source: Ambulatory Visit | Attending: Radiation Oncology | Admitting: Radiation Oncology

## 2015-12-05 DIAGNOSIS — Z510 Encounter for antineoplastic radiation therapy: Secondary | ICD-10-CM | POA: Diagnosis not present

## 2015-12-05 DIAGNOSIS — Z171 Estrogen receptor negative status [ER-]: Secondary | ICD-10-CM | POA: Diagnosis not present

## 2015-12-05 DIAGNOSIS — C50412 Malignant neoplasm of upper-outer quadrant of left female breast: Secondary | ICD-10-CM | POA: Diagnosis not present

## 2015-12-05 DIAGNOSIS — D649 Anemia, unspecified: Secondary | ICD-10-CM | POA: Diagnosis not present

## 2015-12-05 DIAGNOSIS — F419 Anxiety disorder, unspecified: Secondary | ICD-10-CM | POA: Diagnosis not present

## 2015-12-05 DIAGNOSIS — G629 Polyneuropathy, unspecified: Secondary | ICD-10-CM | POA: Diagnosis not present

## 2015-12-06 ENCOUNTER — Ambulatory Visit
Admission: RE | Admit: 2015-12-06 | Discharge: 2015-12-06 | Disposition: A | Discharge status: Home / Self Care | Payer: Medicare Other | Source: Ambulatory Visit | Attending: Radiation Oncology | Admitting: Radiation Oncology

## 2015-12-06 DIAGNOSIS — C50412 Malignant neoplasm of upper-outer quadrant of left female breast: Secondary | ICD-10-CM | POA: Diagnosis not present

## 2015-12-06 DIAGNOSIS — D649 Anemia, unspecified: Secondary | ICD-10-CM | POA: Diagnosis not present

## 2015-12-06 DIAGNOSIS — E119 Type 2 diabetes mellitus without complications: Secondary | ICD-10-CM | POA: Diagnosis not present

## 2015-12-06 DIAGNOSIS — F419 Anxiety disorder, unspecified: Secondary | ICD-10-CM | POA: Diagnosis not present

## 2015-12-06 DIAGNOSIS — Z171 Estrogen receptor negative status [ER-]: Secondary | ICD-10-CM | POA: Diagnosis not present

## 2015-12-06 DIAGNOSIS — G629 Polyneuropathy, unspecified: Secondary | ICD-10-CM | POA: Diagnosis not present

## 2015-12-06 DIAGNOSIS — Z510 Encounter for antineoplastic radiation therapy: Secondary | ICD-10-CM | POA: Diagnosis not present

## 2015-12-09 ENCOUNTER — Ambulatory Visit
Admission: RE | Admit: 2015-12-09 | Discharge: 2015-12-09 | Disposition: A | Discharge status: Home / Self Care | Payer: Medicare Other | Source: Ambulatory Visit | Attending: Radiation Oncology | Admitting: Radiation Oncology

## 2015-12-09 DIAGNOSIS — D649 Anemia, unspecified: Secondary | ICD-10-CM | POA: Diagnosis not present

## 2015-12-09 DIAGNOSIS — C50412 Malignant neoplasm of upper-outer quadrant of left female breast: Secondary | ICD-10-CM | POA: Diagnosis not present

## 2015-12-09 DIAGNOSIS — Z171 Estrogen receptor negative status [ER-]: Secondary | ICD-10-CM | POA: Diagnosis not present

## 2015-12-09 DIAGNOSIS — G629 Polyneuropathy, unspecified: Secondary | ICD-10-CM | POA: Diagnosis not present

## 2015-12-09 DIAGNOSIS — F419 Anxiety disorder, unspecified: Secondary | ICD-10-CM | POA: Diagnosis not present

## 2015-12-09 DIAGNOSIS — Z510 Encounter for antineoplastic radiation therapy: Secondary | ICD-10-CM | POA: Diagnosis not present

## 2015-12-10 ENCOUNTER — Ambulatory Visit
Admission: RE | Admit: 2015-12-10 | Discharge: 2015-12-10 | Disposition: A | Discharge status: Home / Self Care | Payer: Medicare Other | Source: Ambulatory Visit | Attending: Radiation Oncology | Admitting: Radiation Oncology

## 2015-12-10 DIAGNOSIS — C50412 Malignant neoplasm of upper-outer quadrant of left female breast: Secondary | ICD-10-CM | POA: Diagnosis not present

## 2015-12-10 DIAGNOSIS — Z171 Estrogen receptor negative status [ER-]: Secondary | ICD-10-CM | POA: Diagnosis not present

## 2015-12-10 DIAGNOSIS — G629 Polyneuropathy, unspecified: Secondary | ICD-10-CM | POA: Diagnosis not present

## 2015-12-10 DIAGNOSIS — D649 Anemia, unspecified: Secondary | ICD-10-CM | POA: Diagnosis not present

## 2015-12-10 DIAGNOSIS — F419 Anxiety disorder, unspecified: Secondary | ICD-10-CM | POA: Diagnosis not present

## 2015-12-10 DIAGNOSIS — Z510 Encounter for antineoplastic radiation therapy: Secondary | ICD-10-CM | POA: Diagnosis not present

## 2015-12-11 ENCOUNTER — Ambulatory Visit
Admission: RE | Admit: 2015-12-11 | Discharge: 2015-12-11 | Disposition: A | Discharge status: Home / Self Care | Payer: Medicare Other | Source: Ambulatory Visit | Attending: Radiation Oncology | Admitting: Radiation Oncology

## 2015-12-11 DIAGNOSIS — Z171 Estrogen receptor negative status [ER-]: Secondary | ICD-10-CM | POA: Diagnosis not present

## 2015-12-11 DIAGNOSIS — G629 Polyneuropathy, unspecified: Secondary | ICD-10-CM | POA: Diagnosis not present

## 2015-12-11 DIAGNOSIS — D649 Anemia, unspecified: Secondary | ICD-10-CM | POA: Diagnosis not present

## 2015-12-11 DIAGNOSIS — C50412 Malignant neoplasm of upper-outer quadrant of left female breast: Secondary | ICD-10-CM | POA: Diagnosis not present

## 2015-12-11 DIAGNOSIS — Z510 Encounter for antineoplastic radiation therapy: Secondary | ICD-10-CM | POA: Diagnosis not present

## 2015-12-11 DIAGNOSIS — F419 Anxiety disorder, unspecified: Secondary | ICD-10-CM | POA: Diagnosis not present

## 2015-12-12 ENCOUNTER — Ambulatory Visit
Admission: RE | Admit: 2015-12-12 | Discharge: 2015-12-12 | Disposition: A | Discharge status: Home / Self Care | Payer: Medicare Other | Source: Ambulatory Visit | Attending: Radiation Oncology | Admitting: Radiation Oncology

## 2015-12-12 DIAGNOSIS — Z510 Encounter for antineoplastic radiation therapy: Secondary | ICD-10-CM | POA: Diagnosis not present

## 2015-12-12 DIAGNOSIS — C50412 Malignant neoplasm of upper-outer quadrant of left female breast: Secondary | ICD-10-CM | POA: Diagnosis not present

## 2015-12-12 DIAGNOSIS — F419 Anxiety disorder, unspecified: Secondary | ICD-10-CM | POA: Diagnosis not present

## 2015-12-12 DIAGNOSIS — Z171 Estrogen receptor negative status [ER-]: Secondary | ICD-10-CM | POA: Diagnosis not present

## 2015-12-12 DIAGNOSIS — G629 Polyneuropathy, unspecified: Secondary | ICD-10-CM | POA: Diagnosis not present

## 2015-12-12 DIAGNOSIS — D649 Anemia, unspecified: Secondary | ICD-10-CM | POA: Diagnosis not present

## 2015-12-13 ENCOUNTER — Ambulatory Visit
Admission: RE | Admit: 2015-12-13 | Discharge: 2015-12-13 | Disposition: A | Discharge status: Home / Self Care | Payer: Medicare Other | Source: Ambulatory Visit | Attending: Radiation Oncology | Admitting: Radiation Oncology

## 2015-12-13 DIAGNOSIS — G620 Drug-induced polyneuropathy: Secondary | ICD-10-CM | POA: Diagnosis not present

## 2015-12-13 DIAGNOSIS — E119 Type 2 diabetes mellitus without complications: Secondary | ICD-10-CM | POA: Diagnosis not present

## 2015-12-13 DIAGNOSIS — D649 Anemia, unspecified: Secondary | ICD-10-CM | POA: Diagnosis not present

## 2015-12-13 DIAGNOSIS — G629 Polyneuropathy, unspecified: Secondary | ICD-10-CM | POA: Diagnosis not present

## 2015-12-13 DIAGNOSIS — F419 Anxiety disorder, unspecified: Secondary | ICD-10-CM | POA: Diagnosis not present

## 2015-12-13 DIAGNOSIS — Z171 Estrogen receptor negative status [ER-]: Secondary | ICD-10-CM | POA: Diagnosis not present

## 2015-12-13 DIAGNOSIS — C50412 Malignant neoplasm of upper-outer quadrant of left female breast: Secondary | ICD-10-CM | POA: Diagnosis not present

## 2015-12-13 DIAGNOSIS — Z510 Encounter for antineoplastic radiation therapy: Secondary | ICD-10-CM | POA: Diagnosis not present

## 2015-12-16 ENCOUNTER — Other Ambulatory Visit: Payer: Self-pay | Admitting: Family Medicine

## 2015-12-16 ENCOUNTER — Ambulatory Visit
Admission: RE | Admit: 2015-12-16 | Discharge: 2015-12-16 | Disposition: A | Discharge status: Home / Self Care | Payer: Medicare Other | Source: Ambulatory Visit | Attending: Radiation Oncology | Admitting: Radiation Oncology

## 2015-12-16 ENCOUNTER — Inpatient Hospital Stay: Payer: Medicare Other

## 2015-12-16 DIAGNOSIS — F419 Anxiety disorder, unspecified: Secondary | ICD-10-CM | POA: Diagnosis not present

## 2015-12-16 DIAGNOSIS — C50412 Malignant neoplasm of upper-outer quadrant of left female breast: Secondary | ICD-10-CM

## 2015-12-16 DIAGNOSIS — E538 Deficiency of other specified B group vitamins: Secondary | ICD-10-CM | POA: Diagnosis not present

## 2015-12-16 DIAGNOSIS — D649 Anemia, unspecified: Secondary | ICD-10-CM | POA: Diagnosis not present

## 2015-12-16 DIAGNOSIS — D509 Iron deficiency anemia, unspecified: Secondary | ICD-10-CM | POA: Diagnosis not present

## 2015-12-16 DIAGNOSIS — Z171 Estrogen receptor negative status [ER-]: Secondary | ICD-10-CM | POA: Diagnosis not present

## 2015-12-16 DIAGNOSIS — G629 Polyneuropathy, unspecified: Secondary | ICD-10-CM | POA: Diagnosis not present

## 2015-12-16 DIAGNOSIS — Z510 Encounter for antineoplastic radiation therapy: Secondary | ICD-10-CM | POA: Diagnosis not present

## 2015-12-16 LAB — CBC
HEMATOCRIT: 33.1 % — AB (ref 35.0–47.0)
HEMOGLOBIN: 10.9 g/dL — AB (ref 12.0–16.0)
MCH: 28.6 pg (ref 26.0–34.0)
MCHC: 32.8 g/dL (ref 32.0–36.0)
MCV: 87.1 fL (ref 80.0–100.0)
Platelets: 196 10*3/uL (ref 150–440)
RBC: 3.8 MIL/uL (ref 3.80–5.20)
RDW: 15.5 % — ABNORMAL HIGH (ref 11.5–14.5)
WBC: 4.3 10*3/uL (ref 3.6–11.0)

## 2015-12-16 NOTE — Telephone Encounter (Signed)
done

## 2015-12-16 NOTE — Telephone Encounter (Signed)
No recent/future appts., but pt is going through cancer treatments, please advise

## 2015-12-16 NOTE — Telephone Encounter (Signed)
Please refill for 6 mo 

## 2015-12-17 ENCOUNTER — Ambulatory Visit
Admission: RE | Admit: 2015-12-17 | Discharge: 2015-12-17 | Disposition: A | Discharge status: Home / Self Care | Payer: Medicare Other | Source: Ambulatory Visit | Attending: Radiation Oncology | Admitting: Radiation Oncology

## 2015-12-17 DIAGNOSIS — F419 Anxiety disorder, unspecified: Secondary | ICD-10-CM | POA: Diagnosis not present

## 2015-12-17 DIAGNOSIS — Z171 Estrogen receptor negative status [ER-]: Secondary | ICD-10-CM | POA: Diagnosis not present

## 2015-12-17 DIAGNOSIS — Z510 Encounter for antineoplastic radiation therapy: Secondary | ICD-10-CM | POA: Diagnosis not present

## 2015-12-17 DIAGNOSIS — G629 Polyneuropathy, unspecified: Secondary | ICD-10-CM | POA: Diagnosis not present

## 2015-12-17 DIAGNOSIS — C50412 Malignant neoplasm of upper-outer quadrant of left female breast: Secondary | ICD-10-CM | POA: Diagnosis not present

## 2015-12-17 DIAGNOSIS — D649 Anemia, unspecified: Secondary | ICD-10-CM | POA: Diagnosis not present

## 2015-12-18 ENCOUNTER — Ambulatory Visit
Admission: RE | Admit: 2015-12-18 | Discharge: 2015-12-18 | Disposition: A | Discharge status: Home / Self Care | Payer: Medicare Other | Source: Ambulatory Visit | Attending: Radiation Oncology | Admitting: Radiation Oncology

## 2015-12-18 ENCOUNTER — Ambulatory Visit: Admission: RE | Admit: 2015-12-18 | Payer: Medicare Other | Source: Ambulatory Visit

## 2015-12-18 DIAGNOSIS — G629 Polyneuropathy, unspecified: Secondary | ICD-10-CM | POA: Diagnosis not present

## 2015-12-18 DIAGNOSIS — C50412 Malignant neoplasm of upper-outer quadrant of left female breast: Secondary | ICD-10-CM | POA: Diagnosis not present

## 2015-12-18 DIAGNOSIS — F419 Anxiety disorder, unspecified: Secondary | ICD-10-CM | POA: Diagnosis not present

## 2015-12-18 DIAGNOSIS — Z510 Encounter for antineoplastic radiation therapy: Secondary | ICD-10-CM | POA: Diagnosis not present

## 2015-12-18 DIAGNOSIS — D649 Anemia, unspecified: Secondary | ICD-10-CM | POA: Diagnosis not present

## 2015-12-18 DIAGNOSIS — Z171 Estrogen receptor negative status [ER-]: Secondary | ICD-10-CM | POA: Diagnosis not present

## 2015-12-18 HISTORY — PX: BREAST EXCISIONAL BIOPSY: SUR124

## 2015-12-19 ENCOUNTER — Ambulatory Visit
Admission: RE | Admit: 2015-12-19 | Discharge: 2015-12-19 | Disposition: A | Discharge status: Home / Self Care | Payer: Medicare Other | Source: Ambulatory Visit | Attending: Radiation Oncology | Admitting: Radiation Oncology

## 2015-12-19 DIAGNOSIS — D649 Anemia, unspecified: Secondary | ICD-10-CM | POA: Diagnosis not present

## 2015-12-19 DIAGNOSIS — C50412 Malignant neoplasm of upper-outer quadrant of left female breast: Secondary | ICD-10-CM | POA: Diagnosis not present

## 2015-12-19 DIAGNOSIS — Z171 Estrogen receptor negative status [ER-]: Secondary | ICD-10-CM | POA: Diagnosis not present

## 2015-12-19 DIAGNOSIS — G629 Polyneuropathy, unspecified: Secondary | ICD-10-CM | POA: Diagnosis not present

## 2015-12-19 DIAGNOSIS — F419 Anxiety disorder, unspecified: Secondary | ICD-10-CM | POA: Diagnosis not present

## 2015-12-19 DIAGNOSIS — Z510 Encounter for antineoplastic radiation therapy: Secondary | ICD-10-CM | POA: Diagnosis not present

## 2015-12-20 ENCOUNTER — Ambulatory Visit
Admission: RE | Admit: 2015-12-20 | Discharge: 2015-12-20 | Disposition: A | Discharge status: Home / Self Care | Payer: Medicare Other | Source: Ambulatory Visit | Attending: Radiation Oncology | Admitting: Radiation Oncology

## 2015-12-20 DIAGNOSIS — F411 Generalized anxiety disorder: Secondary | ICD-10-CM | POA: Diagnosis not present

## 2015-12-20 DIAGNOSIS — G629 Polyneuropathy, unspecified: Secondary | ICD-10-CM | POA: Diagnosis not present

## 2015-12-20 DIAGNOSIS — Z510 Encounter for antineoplastic radiation therapy: Secondary | ICD-10-CM | POA: Diagnosis not present

## 2015-12-20 DIAGNOSIS — C50412 Malignant neoplasm of upper-outer quadrant of left female breast: Secondary | ICD-10-CM | POA: Diagnosis not present

## 2015-12-20 DIAGNOSIS — Z171 Estrogen receptor negative status [ER-]: Secondary | ICD-10-CM | POA: Diagnosis not present

## 2015-12-20 DIAGNOSIS — D649 Anemia, unspecified: Secondary | ICD-10-CM | POA: Diagnosis not present

## 2015-12-20 DIAGNOSIS — F419 Anxiety disorder, unspecified: Secondary | ICD-10-CM | POA: Diagnosis not present

## 2015-12-21 ENCOUNTER — Other Ambulatory Visit: Payer: Self-pay | Admitting: Family Medicine

## 2015-12-23 ENCOUNTER — Ambulatory Visit
Admission: RE | Admit: 2015-12-23 | Discharge: 2015-12-23 | Disposition: A | Discharge status: Home / Self Care | Payer: Medicare Other | Source: Ambulatory Visit | Attending: Radiation Oncology | Admitting: Radiation Oncology

## 2015-12-23 DIAGNOSIS — G629 Polyneuropathy, unspecified: Secondary | ICD-10-CM | POA: Diagnosis not present

## 2015-12-23 DIAGNOSIS — C50412 Malignant neoplasm of upper-outer quadrant of left female breast: Secondary | ICD-10-CM | POA: Diagnosis not present

## 2015-12-23 DIAGNOSIS — Z171 Estrogen receptor negative status [ER-]: Secondary | ICD-10-CM | POA: Diagnosis not present

## 2015-12-23 DIAGNOSIS — Z510 Encounter for antineoplastic radiation therapy: Secondary | ICD-10-CM | POA: Diagnosis not present

## 2015-12-23 DIAGNOSIS — D649 Anemia, unspecified: Secondary | ICD-10-CM | POA: Diagnosis not present

## 2015-12-23 DIAGNOSIS — F419 Anxiety disorder, unspecified: Secondary | ICD-10-CM | POA: Diagnosis not present

## 2015-12-24 ENCOUNTER — Ambulatory Visit
Admission: RE | Admit: 2015-12-24 | Discharge: 2015-12-24 | Disposition: A | Discharge status: Home / Self Care | Payer: Medicare Other | Source: Ambulatory Visit | Attending: Radiation Oncology | Admitting: Radiation Oncology

## 2015-12-24 DIAGNOSIS — G629 Polyneuropathy, unspecified: Secondary | ICD-10-CM | POA: Diagnosis not present

## 2015-12-24 DIAGNOSIS — F419 Anxiety disorder, unspecified: Secondary | ICD-10-CM | POA: Diagnosis not present

## 2015-12-24 DIAGNOSIS — D649 Anemia, unspecified: Secondary | ICD-10-CM | POA: Diagnosis not present

## 2015-12-24 DIAGNOSIS — C50412 Malignant neoplasm of upper-outer quadrant of left female breast: Secondary | ICD-10-CM | POA: Diagnosis not present

## 2015-12-24 DIAGNOSIS — Z171 Estrogen receptor negative status [ER-]: Secondary | ICD-10-CM | POA: Diagnosis not present

## 2015-12-24 DIAGNOSIS — Z510 Encounter for antineoplastic radiation therapy: Secondary | ICD-10-CM | POA: Diagnosis not present

## 2015-12-25 ENCOUNTER — Ambulatory Visit
Admission: RE | Admit: 2015-12-25 | Discharge: 2015-12-25 | Disposition: A | Discharge status: Home / Self Care | Payer: Medicare Other | Source: Ambulatory Visit | Attending: Radiation Oncology | Admitting: Radiation Oncology

## 2015-12-25 DIAGNOSIS — C50412 Malignant neoplasm of upper-outer quadrant of left female breast: Secondary | ICD-10-CM | POA: Diagnosis not present

## 2015-12-25 DIAGNOSIS — Z171 Estrogen receptor negative status [ER-]: Secondary | ICD-10-CM | POA: Diagnosis not present

## 2015-12-25 DIAGNOSIS — Z510 Encounter for antineoplastic radiation therapy: Secondary | ICD-10-CM | POA: Diagnosis not present

## 2015-12-25 DIAGNOSIS — G629 Polyneuropathy, unspecified: Secondary | ICD-10-CM | POA: Diagnosis not present

## 2015-12-25 DIAGNOSIS — F419 Anxiety disorder, unspecified: Secondary | ICD-10-CM | POA: Diagnosis not present

## 2015-12-25 DIAGNOSIS — D649 Anemia, unspecified: Secondary | ICD-10-CM | POA: Diagnosis not present

## 2015-12-26 ENCOUNTER — Ambulatory Visit
Admission: RE | Admit: 2015-12-26 | Discharge: 2015-12-26 | Disposition: A | Discharge status: Home / Self Care | Payer: Medicare Other | Source: Ambulatory Visit | Attending: Radiation Oncology | Admitting: Radiation Oncology

## 2015-12-26 DIAGNOSIS — D649 Anemia, unspecified: Secondary | ICD-10-CM | POA: Diagnosis not present

## 2015-12-26 DIAGNOSIS — C50412 Malignant neoplasm of upper-outer quadrant of left female breast: Secondary | ICD-10-CM | POA: Diagnosis not present

## 2015-12-26 DIAGNOSIS — Z171 Estrogen receptor negative status [ER-]: Secondary | ICD-10-CM | POA: Diagnosis not present

## 2015-12-26 DIAGNOSIS — Z510 Encounter for antineoplastic radiation therapy: Secondary | ICD-10-CM | POA: Diagnosis not present

## 2015-12-26 DIAGNOSIS — G629 Polyneuropathy, unspecified: Secondary | ICD-10-CM | POA: Diagnosis not present

## 2015-12-26 DIAGNOSIS — F419 Anxiety disorder, unspecified: Secondary | ICD-10-CM | POA: Diagnosis not present

## 2015-12-27 ENCOUNTER — Ambulatory Visit: Payer: Medicare Other

## 2015-12-30 ENCOUNTER — Ambulatory Visit: Payer: Medicare Other

## 2015-12-30 ENCOUNTER — Ambulatory Visit
Admission: RE | Admit: 2015-12-30 | Discharge: 2015-12-30 | Disposition: A | Discharge status: Home / Self Care | Payer: Medicare Other | Source: Ambulatory Visit | Attending: Radiation Oncology | Admitting: Radiation Oncology

## 2015-12-30 DIAGNOSIS — Z171 Estrogen receptor negative status [ER-]: Secondary | ICD-10-CM | POA: Diagnosis not present

## 2015-12-30 DIAGNOSIS — C50412 Malignant neoplasm of upper-outer quadrant of left female breast: Secondary | ICD-10-CM | POA: Diagnosis not present

## 2015-12-30 DIAGNOSIS — G629 Polyneuropathy, unspecified: Secondary | ICD-10-CM | POA: Diagnosis not present

## 2015-12-30 DIAGNOSIS — D649 Anemia, unspecified: Secondary | ICD-10-CM | POA: Diagnosis not present

## 2015-12-30 DIAGNOSIS — Z510 Encounter for antineoplastic radiation therapy: Secondary | ICD-10-CM | POA: Diagnosis not present

## 2015-12-30 DIAGNOSIS — F419 Anxiety disorder, unspecified: Secondary | ICD-10-CM | POA: Diagnosis not present

## 2015-12-31 ENCOUNTER — Ambulatory Visit
Admission: RE | Admit: 2015-12-31 | Discharge: 2015-12-31 | Disposition: A | Discharge status: Home / Self Care | Payer: Medicare Other | Source: Ambulatory Visit | Attending: Radiation Oncology | Admitting: Radiation Oncology

## 2015-12-31 DIAGNOSIS — Z510 Encounter for antineoplastic radiation therapy: Secondary | ICD-10-CM | POA: Diagnosis not present

## 2015-12-31 DIAGNOSIS — D649 Anemia, unspecified: Secondary | ICD-10-CM | POA: Diagnosis not present

## 2015-12-31 DIAGNOSIS — C50412 Malignant neoplasm of upper-outer quadrant of left female breast: Secondary | ICD-10-CM | POA: Diagnosis not present

## 2015-12-31 DIAGNOSIS — Z171 Estrogen receptor negative status [ER-]: Secondary | ICD-10-CM | POA: Diagnosis not present

## 2015-12-31 DIAGNOSIS — G629 Polyneuropathy, unspecified: Secondary | ICD-10-CM | POA: Diagnosis not present

## 2015-12-31 DIAGNOSIS — F419 Anxiety disorder, unspecified: Secondary | ICD-10-CM | POA: Diagnosis not present

## 2016-01-01 ENCOUNTER — Encounter: Payer: Self-pay | Admitting: Family Medicine

## 2016-01-01 DIAGNOSIS — G4733 Obstructive sleep apnea (adult) (pediatric): Secondary | ICD-10-CM | POA: Insufficient documentation

## 2016-01-02 ENCOUNTER — Inpatient Hospital Stay: Payer: Medicare Other | Attending: Internal Medicine

## 2016-01-02 ENCOUNTER — Inpatient Hospital Stay: Payer: Medicare Other

## 2016-01-02 ENCOUNTER — Inpatient Hospital Stay (HOSPITAL_BASED_OUTPATIENT_CLINIC_OR_DEPARTMENT_OTHER): Payer: Medicare Other | Admitting: Internal Medicine

## 2016-01-02 DIAGNOSIS — Z171 Estrogen receptor negative status [ER-]: Secondary | ICD-10-CM | POA: Insufficient documentation

## 2016-01-02 DIAGNOSIS — Z808 Family history of malignant neoplasm of other organs or systems: Secondary | ICD-10-CM | POA: Insufficient documentation

## 2016-01-02 DIAGNOSIS — Z87891 Personal history of nicotine dependence: Secondary | ICD-10-CM

## 2016-01-02 DIAGNOSIS — Z79899 Other long term (current) drug therapy: Secondary | ICD-10-CM

## 2016-01-02 DIAGNOSIS — R202 Paresthesia of skin: Secondary | ICD-10-CM | POA: Insufficient documentation

## 2016-01-02 DIAGNOSIS — Z9221 Personal history of antineoplastic chemotherapy: Secondary | ICD-10-CM | POA: Insufficient documentation

## 2016-01-02 DIAGNOSIS — K5790 Diverticulosis of intestine, part unspecified, without perforation or abscess without bleeding: Secondary | ICD-10-CM | POA: Diagnosis not present

## 2016-01-02 DIAGNOSIS — E538 Deficiency of other specified B group vitamins: Secondary | ICD-10-CM | POA: Diagnosis not present

## 2016-01-02 DIAGNOSIS — F419 Anxiety disorder, unspecified: Secondary | ICD-10-CM | POA: Insufficient documentation

## 2016-01-02 DIAGNOSIS — R200 Anesthesia of skin: Secondary | ICD-10-CM

## 2016-01-02 DIAGNOSIS — I10 Essential (primary) hypertension: Secondary | ICD-10-CM

## 2016-01-02 DIAGNOSIS — E785 Hyperlipidemia, unspecified: Secondary | ICD-10-CM | POA: Diagnosis not present

## 2016-01-02 DIAGNOSIS — D509 Iron deficiency anemia, unspecified: Secondary | ICD-10-CM | POA: Insufficient documentation

## 2016-01-02 DIAGNOSIS — K219 Gastro-esophageal reflux disease without esophagitis: Secondary | ICD-10-CM | POA: Diagnosis not present

## 2016-01-02 DIAGNOSIS — Z923 Personal history of irradiation: Secondary | ICD-10-CM | POA: Insufficient documentation

## 2016-01-02 DIAGNOSIS — E119 Type 2 diabetes mellitus without complications: Secondary | ICD-10-CM

## 2016-01-02 DIAGNOSIS — N879 Dysplasia of cervix uteri, unspecified: Secondary | ICD-10-CM | POA: Insufficient documentation

## 2016-01-02 DIAGNOSIS — Z85819 Personal history of malignant neoplasm of unspecified site of lip, oral cavity, and pharynx: Secondary | ICD-10-CM

## 2016-01-02 DIAGNOSIS — C50412 Malignant neoplasm of upper-outer quadrant of left female breast: Secondary | ICD-10-CM | POA: Insufficient documentation

## 2016-01-02 LAB — BASIC METABOLIC PANEL
ANION GAP: 8 (ref 5–15)
BUN: 21 mg/dL — AB (ref 6–20)
CO2: 25 mmol/L (ref 22–32)
Calcium: 8.8 mg/dL — ABNORMAL LOW (ref 8.9–10.3)
Chloride: 103 mmol/L (ref 101–111)
Creatinine, Ser: 0.8 mg/dL (ref 0.44–1.00)
GFR calc Af Amer: >60 mL/min (ref >60)
GFR calc non Af Amer: >60 mL/min (ref >60)
GLUCOSE: 98 mg/dL (ref 65–99)
POTASSIUM: 4.2 mmol/L (ref 3.5–5.1)
Sodium: 136 mmol/L (ref 135–145)

## 2016-01-02 LAB — CBC WITH DIFFERENTIAL/PLATELET
BASOS ABS: 0 10*3/uL (ref 0–0.1)
Basophils Relative: 1 %
Eosinophils Absolute: 0.1 10*3/uL (ref 0–0.7)
Eosinophils Relative: 3 %
HEMATOCRIT: 30.7 % — AB (ref 35.0–47.0)
Hemoglobin: 10.3 g/dL — ABNORMAL LOW (ref 12.0–16.0)
LYMPHS ABS: 0.4 10*3/uL — AB (ref 1.0–3.6)
LYMPHS PCT: 9 %
MCH: 28.7 pg (ref 26.0–34.0)
MCHC: 33.5 g/dL (ref 32.0–36.0)
MCV: 85.7 fL (ref 80.0–100.0)
MONO ABS: 0.3 10*3/uL (ref 0.2–0.9)
Monocytes Relative: 7 %
NEUTROS ABS: 3.2 10*3/uL (ref 1.4–6.5)
Neutrophils Relative %: 80 %
Platelets: 182 10*3/uL (ref 150–440)
RBC: 3.58 MIL/uL — ABNORMAL LOW (ref 3.80–5.20)
RDW: 15.4 % — AB (ref 11.5–14.5)
WBC: 4 10*3/uL (ref 3.6–11.0)

## 2016-01-02 LAB — MAGNESIUM: Magnesium: 1.4 mg/dL — ABNORMAL LOW (ref 1.7–2.4)

## 2016-01-02 MED ORDER — HEPARIN SOD (PORK) LOCK FLUSH 100 UNIT/ML IV SOLN
INTRAVENOUS | Status: AC
  Filled 2016-01-02: qty 5

## 2016-01-02 MED ORDER — HYDROCODONE-ACETAMINOPHEN 5-325 MG PO TABS: 1.0000 | ORAL_TABLET | Freq: Every day | ORAL | 0 refills | Status: DC

## 2016-01-02 MED ORDER — HEPARIN SOD (PORK) LOCK FLUSH 100 UNIT/ML IV SOLN
500.0000 [IU] | Freq: Once | INTRAVENOUS | Status: AC
  Administered 2016-01-02: 500 [IU] via INTRAVENOUS

## 2016-01-02 MED ORDER — SODIUM CHLORIDE 0.9% FLUSH
10.0000 mL | Freq: Once | INTRAVENOUS | Status: AC
  Administered 2016-01-02: 10 mL via INTRAVENOUS
  Filled 2016-01-02: qty 10

## 2016-01-02 NOTE — Assessment & Plan Note (Addendum)
Left breast cancer stage III  ER/PR/her 2 Neu NEGCurrently on neoadjuvant chemotherapy; excellent response. S/p Surgery- complete pathologic response. s/p RT. No adjuvant anti-hormone therapy.   # PN- G-2  on 300 three pills a day; Cymbalta 20mg /dy- stopped sec to side effects; trial of hydrocodone as needed at night time.   # Hypomagnesemia- 1.4; on home supplementation.  # follow up in 8 weeks/ labs/port flush.   # Follow up in 4 months/labs/port flush.

## 2016-01-02 NOTE — Progress Notes (Signed)
Patient is here for follow up, she is having some pain at night she would like to ask about going back to the Tramadol, Dr. Oliva Bustard used to giver her this.

## 2016-01-02 NOTE — Progress Notes (Signed)
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 5 CM TUMOR MASS BIOPSIES POSITIVE FOR INVASIVE MAMMARY CARCINOMA TRIPLE NEGATIVE DISEASE(jANUARY, 2017)stage IIIa. ER/PR/her2 Neu-NEGATIVE  # Cytoxan and Adriamycin from March 18, 2015; carbo-taxol x4 cycles  # AUg 18th s/p Lumpec & SLNBx- Complete path CR [ypT0 ypsn0] s/p RT [oct 2017; Dr.Crystal]  3. MUGA scan of the heart shows ejection fraction to be 61% (January, 2017) 4     Cancer of left breast ([place])   03/01/2015 Initial Diagnosis    Cancer of left breast ([place])       Malignant neoplasm of upper-outer quadrant of left female breast ([place])   03/11/2015 Initial Diagnosis    Malignant neoplasm of upper-outer quadrant of left female breast ([place])       Carcinoma of upper-outer quadrant of left breast in female, estrogen receptor negative ([place])   11/21/2015 Initial Diagnosis    Carcinoma of upper-outer quadrant of left breast in female, estrogen receptor negative ([place])     INTERVAL HISTORY:  Suzanne Strong 70 y.o.  female pleasant patient above history of stage IIIa breast cancer currently Status post radiation is here for follow-up.   Patient continues to have moderate received tingling and numbness/associated pain in the legs mostly at  nighttime. Patient is currently on Neurontin 300 mg daily. She did not tolerate Cymbalta well. She is current on amitriptyline. Interested in pain medication. Denies any new lumps or bumps.   REVIEW OF SYSTEMS:  A complete 10 point review of system is done which is negative except mentioned above/history of present illness.   PAST MEDICAL HISTORY :  Past Medical History:  Diagnosis Date  . Anemia    iron deficiency and B12 deficiency  . Anxiety   . Breast cancer ([place]) 02/25/2015   upper-outer quadrant of left female breast, ER, PR, HER-2 Negative  . Cervical dysplasia    conization  . Diabetes mellitus without complication ([place])   . Diverticulosis   . Fever 04/11/2015  . Gastroparesis    related to previous radiation tx  . GERD (gastroesophageal reflux disease)   . Hyperlipidemia   . Hypertension   . Shingles    Hx of  . 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 APPENDECTOMY    . BREAST BIOPSY Left 02/25/2015   path pending  . BREAST CYST ASPIRATION    . BREAST LUMPECTOMY WITH NEEDLE LOCALIZATION Left 10/02/2015   Procedure: BREAST LUMPECTOMY WITH NEEDLE LOCALIZATION;  Surgeon: [name], MD;  Location: [facility] ORS;  Service: General;  Laterality: Left;  . BREAST LUMPECTOMY WITH SENTINEL LYMPH NODE BIOPSY Left 10/02/2015   Procedure: LEFT BREAST WIDE EXCISION WITH SENTINEL LYMPH NODE BX;  Surgeon: [name],  MD;  Location: [facility] ORS;  Service: General;  Laterality: Left;  . BREAST SURGERY     breast biopsy benign  . CHOLECYSTECTOMY    . ESOPHAGOGASTRODUODENOSCOPY  07/2009   normal with few gastric polyps  . MOLE REMOVAL    . PORT-A-CATH REMOVAL    . PORTACATH PLACEMENT    . PORTACATH PLACEMENT Right 03/12/2015   Procedure: INSERTION PORT-A-CATH;  Surgeon: Earline Mayotte, MD;  Location: [facility] ORS;  Service: General;  Laterality: Right;  . RADICAL NECK DISSECTION    . TONSILLECTOMY     cancer  treated with chemo and radiation    FAMILY HISTORY :   Family History  Problem Relation Age of Onset  . Osteoporosis Mother   . Hyperlipidemia Mother   . Depression Mother   . Cancer Mother     skin cancer ? basal cell and lung ca heavy smoker  . Alcohol abuse Father   . Cancer Father     skin CA ? basal cell  . Heart disease Father     CHF  . Hyperlipidemia Brother   . Hypertension Brother     SOCIAL HISTORY:   Social History  Substance Use Topics  . Smoking status: Former Smoker    Packs/day: 0.50    Years: 6.00    Types: Cigarettes    Quit date: 03/10/1984  . Smokeless tobacco: Never Used  . Alcohol use No    ALLERGIES:  is allergic to amoxicillin and fluconazole.  MEDICATIONS:  Current Outpatient Prescriptions  Medication Sig Dispense Refill  . ALPRAZolam (XANAX) 0.5 MG tablet Take 1 tablet (0.5 mg total) by mouth at bedtime as needed for sleep. 30 tablet 3  . amitriptyline (ELAVIL) 10 MG tablet Take 10 mg by mouth at bedtime.    [name] atorvastatin (LIPITOR) 10 MG tablet take 1 tablet by mouth once daily 30 tablet 5  . b complex vitamins tablet Take 1 tablet by mouth daily.      . cyanocobalamin (,VITAMIN B-12,) 1000 MCG/ML injection Inject 1,000 mcg into the muscle every 30 (thirty) days.      . fluticasone (FLONASE) 50 MCG/ACT nasal spray instill 2 sprays into each nostril once daily 16 g 2  . gabapentin (NEURONTIN) 300 MG capsule Take 1 pill in am; and 2 pills in PM 90 capsule 3  . JANUMET XR 50-1000 MG TB24 Take 1 tablet by mouth 2 (two) times daily.   0  . lidocaine-prilocaine (EMLA) cream Apply 1 application topically as needed. Apply to port when going through Chemo    . lisinopril (PRINIVIL,ZESTRIL) 5 MG tablet take 1 tablet by mouth once daily 30 tablet 5  . mirtazapine (REMERON) 30 MG tablet take 2 tablet by mouth at bedtime  0  . omeprazole (PRILOSEC) 20 MG capsule take 1 capsule by mouth once daily 30 capsule 2  . potassium chloride (K-DUR) 10 MEQ tablet  take 1 tablet by mouth once daily 30 tablet 2  . rOPINIRole (REQUIP) 1 MG tablet take 1 tablet by mouth every morning if needed and 3 tablets by mouth at bedtime 120 tablet 5  . sertraline (ZOLOFT) 100 MG tablet Take 200 mg by mouth every morning. Reported on 08/27/2015  0  . SLOW-MAG 71.5-119 MG TBEC SR tablet take 1 tablet by mouth daily 30 tablet 2  . DULoxetine (CYMBALTA) 20 MG capsule Take 1 capsule (20 mg total) by mouth daily. (Patient not taking: Reported on 01/02/2016) 30 capsule 3  . glipiZIDE (GLUCOTROL) 10  MG tablet Take 20 mg by mouth daily before breakfast. Take 2 tabs daily on the day of chemotherapy and the day after chemo    . HYDROcodone-acetaminophen (NORCO/VICODIN) 5-325 MG tablet Take 1 tablet by mouth at bedtime. 30 tablet 0  . ondansetron (ZOFRAN) 8 MG tablet take 1 tablet by mouth twice a day (START ON THE 3RD DAY AFTER CHEMOTHERAPY) (Patient not taking: Reported on 01/02/2016) 30 tablet 1   No current facility-administered medications for this visit.    Facility-Administered Medications Ordered in Other Visits  Medication Dose Route Frequency Provider Last Rate Last Dose  . sodium chloride flush (NS) 0.9 % injection 10 mL  10 mL Intravenous PRN Cammie Sickle, MD   10 mL at 09/11/15 0845    PHYSICAL EXAMINATION: ECOG PERFORMANCE STATUS: 0 - Asymptomatic  BP 117/73 (BP Location: Right Arm, Patient Position: Sitting)   Pulse 92   Temp (!) 96.9 F (36.1 C) (Tympanic)   Resp 18   Wt 211 lb 6.4 oz (95.9 kg)   BMI 34.12 kg/m   Filed Weights   01/02/16 1438  Weight: 211 lb 6.4 oz (95.9 kg)    GENERAL: Well-nourished well-developed; Alert, no distress and comfortable.   Alone. EYES: no pallor or icterus OROPHARYNX: no thrush or ulceration; good dentition  NECK: supple, no masses felt LYMPH:  no palpable lymphadenopathy in the cervical, axillary or inguinal regions LUNGS: clear to auscultation and  No wheeze or crackles HEART/CVS: regular rate & rhythm and  no murmurs; No lower extremity edema ABDOMEN:abdomen soft, non-tender and normal bowel sounds Musculoskeletal:no cyanosis of digits and no clubbing  PSYCH: alert & oriented x 3 with fluent speech NEURO: no focal motor/sensory deficits SKIN:  no rashes or significant lesions  LABORATORY DATA:  I have reviewed the data as listed    Component Value Date/Time   NA 136 01/02/2016 1407   NA 142 03/01/2014 0846   K 4.2 01/02/2016 1407   K 4.3 03/01/2014 0846   CL 103 01/02/2016 1407   CL 102 03/01/2014 0846   CO2 25 01/02/2016 1407   CO2 28 03/01/2014 0846   GLUCOSE 98 01/02/2016 1407   GLUCOSE 219 (H) 03/01/2014 0846   BUN 21 (H) 01/02/2016 1407   BUN 12 03/01/2014 0846   CREATININE 0.80 01/02/2016 1407   CREATININE 0.95 03/01/2014 0846   CALCIUM 8.8 (L) 01/02/2016 1407   CALCIUM 8.6 03/01/2014 0846   PROT 6.9 11/21/2015 1339   PROT 7.5 03/01/2014 0846   ALBUMIN 3.6 11/21/2015 1339   ALBUMIN 3.4 03/01/2014 0846   AST 29 11/21/2015 1339   AST 20 03/01/2014 0846   ALT 20 11/21/2015 1339   ALT 28 03/01/2014 0846   ALKPHOS 84 11/21/2015 1339   ALKPHOS 122 (H) 03/01/2014 0846   BILITOT 0.4 11/21/2015 1339   BILITOT 0.2 03/01/2014 0846   GFRNONAA >60 01/02/2016 1407   GFRNONAA >60 03/01/2014 0846   GFRNONAA >60 08/24/2013 1015   GFRAA >60 01/02/2016 1407   GFRAA >60 03/01/2014 0846   GFRAA >60 08/24/2013 1015    No results found for: SPEP, UPEP  Lab Results  Component Value Date   WBC 4.0 01/02/2016   NEUTROABS 3.2 01/02/2016   HGB 10.3 (L) 01/02/2016   HCT 30.7 (L) 01/02/2016   MCV 85.7 01/02/2016   PLT 182 01/02/2016      Chemistry      Component Value Date/Time   NA 136 01/02/2016 1407   NA 142 03/01/2014 0846  K 4.2 01/02/2016 1407   K 4.3 03/01/2014 0846   CL 103 01/02/2016 1407   CL 102 03/01/2014 0846   CO2 25 01/02/2016 1407   CO2 28 03/01/2014 0846   BUN 21 (H) 01/02/2016 1407   BUN 12 03/01/2014 0846   CREATININE 0.80 01/02/2016 1407    CREATININE 0.95 03/01/2014 0846      Component Value Date/Time   CALCIUM 8.8 (L) 01/02/2016 1407   CALCIUM 8.6 03/01/2014 0846   ALKPHOS 84 11/21/2015 1339   ALKPHOS 122 (H) 03/01/2014 0846   AST 29 11/21/2015 1339   AST 20 03/01/2014 0846   ALT 20 11/21/2015 1339   ALT 28 03/01/2014 0846   BILITOT 0.4 11/21/2015 1339   BILITOT 0.2 03/01/2014 0846       RADIOGRAPHIC STUDIES: I have personally reviewed the radiological images as listed and agreed with the findings in the report. No results found.   ASSESSMENT & PLAN:  Carcinoma of upper-outer quadrant of left breast in female, estrogen receptor negative ([place]) Left breast cancer stage III  ER/PR/her 2 Neu NEGCurrently on neoadjuvant chemotherapy; excellent response. S/p Surgery- complete pathologic response. s/p RT. No adjuvant anti-hormone therapy.   # PN- G-2  on 300 three pills a day; Cymbalta '20mg'$ /dy- stopped sec to side effects; trial of hydrocodone as needed at night time.   # Hypomagnesemia- 1.4; on home supplementation.  # follow up in 8 weeks/ labs/port flush.   # Follow up in 4 months/labs/port flush.    Orders Placed This Encounter  Procedures  . CBC with Differential    Standing Status:   Future    Standing Expiration Date:   01/01/2017  . Basic metabolic panel    Standing Status:   Future    Standing Expiration Date:   01/01/2017  . Cancer antigen 27.29    Standing Status:   Future    Standing Expiration Date:   01/01/2017  . CBC with Differential    Standing Status:   Future    Standing Expiration Date:   01/01/2017  . Comprehensive metabolic panel    Standing Status:   Future    Standing Expiration Date:   01/01/2017  . Cancer antigen 27.29    Standing Status:   Future    Standing Expiration Date:   01/01/2017      Cammie Sickle, MD 01/02/2016 5:15 PM

## 2016-01-17 ENCOUNTER — Telehealth: Payer: Self-pay | Admitting: Family Medicine

## 2016-01-17 NOTE — Telephone Encounter (Signed)
LVM for pt to call back and schedule AWV + labs with Lesia and OV 30 with PCP. °

## 2016-01-24 ENCOUNTER — Other Ambulatory Visit: Payer: Self-pay | Admitting: Internal Medicine

## 2016-01-24 DIAGNOSIS — C50412 Malignant neoplasm of upper-outer quadrant of left female breast: Secondary | ICD-10-CM

## 2016-02-12 ENCOUNTER — Other Ambulatory Visit: Payer: Self-pay | Admitting: Internal Medicine

## 2016-02-12 ENCOUNTER — Ambulatory Visit: Payer: Medicare Other | Admitting: Radiation Oncology

## 2016-02-12 DIAGNOSIS — C50412 Malignant neoplasm of upper-outer quadrant of left female breast: Secondary | ICD-10-CM

## 2016-02-13 ENCOUNTER — Other Ambulatory Visit: Payer: Self-pay | Admitting: Family Medicine

## 2016-02-13 NOTE — Telephone Encounter (Signed)
No recent/future appts., ? If pt is still going through cancer treatments, please advise

## 2016-02-13 NOTE — Telephone Encounter (Signed)
done

## 2016-02-13 NOTE — Telephone Encounter (Signed)
Please refill for a year  

## 2016-02-17 DIAGNOSIS — C099 Malignant neoplasm of tonsil, unspecified: Secondary | ICD-10-CM | POA: Insufficient documentation

## 2016-02-24 ENCOUNTER — Encounter: Payer: Self-pay | Admitting: Radiation Oncology

## 2016-02-24 ENCOUNTER — Ambulatory Visit
Admission: RE | Admit: 2016-02-24 | Discharge: 2016-02-24 | Disposition: A | Discharge status: Home / Self Care | Payer: Medicare Other | Source: Ambulatory Visit | Attending: Radiation Oncology | Admitting: Radiation Oncology

## 2016-02-24 VITALS — BP 124/66 | HR 89 | Temp 96.4°F | Resp 20 | Wt 216.3 lb

## 2016-02-24 DIAGNOSIS — Z923 Personal history of irradiation: Secondary | ICD-10-CM | POA: Insufficient documentation

## 2016-02-24 DIAGNOSIS — Z853 Personal history of malignant neoplasm of breast: Secondary | ICD-10-CM | POA: Insufficient documentation

## 2016-02-24 DIAGNOSIS — Z171 Estrogen receptor negative status [ER-]: Secondary | ICD-10-CM

## 2016-02-24 DIAGNOSIS — C50412 Malignant neoplasm of upper-outer quadrant of left female breast: Secondary | ICD-10-CM

## 2016-02-24 NOTE — Progress Notes (Signed)
Radiation Oncology Follow up Note  Name: Suzanne Strong   Date:   02/24/2016 MRN:  [id number] DOB: [birth year]    This 71 y.o. female presents to the clinic today for one-month follow-up status post whole breast radiation for invasive mammary carcinoma.  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 She seen today in routine follow up and is doing well. She specifically denies breast tenderness cough or bone pain. Based on the triple negative nature of her disease she is not on antiestrogen therapy.  COMPLICATIONS OF TREATMENT: none  FOLLOW UP COMPLIANCE: keeps appointments   PHYSICAL EXAM:  BP 124/66   Pulse 89   Temp (!) 96.4 F (35.8 C)   Resp 20   Wt 216 lb 4.3 oz (98.1 kg)   BMI 34.91 kg/m  Lungs are clear to A&P cardiac examination essentially unremarkable with regular rate and rhythm. No dominant mass or nodularity is noted in either breast in 2 positions examined. Incision is well-healed. No axillary or supraclavicular adenopathy is appreciated. Cosmetic result is excellent. Well-developed well-nourished patient in NAD. HEENT reveals PERLA, EOMI, discs not visualized.  Oral cavity is clear. No oral mucosal lesions are identified. Neck is clear without evidence of cervical or supraclavicular adenopathy. Lungs are clear to A&P. Cardiac examination is essentially unremarkable with regular rate and rhythm without murmur rub or thrill. Abdomen is benign with no organomegaly or masses noted. Motor sensory and DTR levels are equal and symmetric in the upper and lower extremities. Cranial nerves II through XII are grossly intact. Proprioception is intact. No peripheral adenopathy or edema is identified. No motor or sensory levels are noted. Crude visual fields are within  normal range.  RADIOLOGY RESULTS: No current films for review  PLAN: At the present time she continues to do well with no evidence of disease. I am please were overall progress. I've asked to see around 4-5 months for follow-up. She is not on antiestrogen therapy based on triple negative nature of her disease. Patient continues close follow-up care with medical oncology. She knows to call us sooner with any concerns.  I would like to take this opportunity to thank you for allowing me to participate in the care of your patient.Armstead Peaks., MD

## 2016-02-27 ENCOUNTER — Inpatient Hospital Stay: Payer: Medicare Other | Attending: Internal Medicine

## 2016-02-27 DIAGNOSIS — Z171 Estrogen receptor negative status [ER-]: Secondary | ICD-10-CM | POA: Insufficient documentation

## 2016-02-27 DIAGNOSIS — C50412 Malignant neoplasm of upper-outer quadrant of left female breast: Secondary | ICD-10-CM | POA: Insufficient documentation

## 2016-02-27 DIAGNOSIS — Z95828 Presence of other vascular implants and grafts: Secondary | ICD-10-CM

## 2016-02-27 LAB — CBC WITH DIFFERENTIAL/PLATELET
BASOS PCT: 0 %
Basophils Absolute: 0 10*3/uL (ref 0–0.1)
EOS PCT: 3 %
Eosinophils Absolute: 0.1 10*3/uL (ref 0–0.7)
HEMATOCRIT: 29.8 % — AB (ref 35.0–47.0)
Hemoglobin: 9.8 g/dL — ABNORMAL LOW (ref 12.0–16.0)
Lymphocytes Relative: 16 %
Lymphs Abs: 0.7 10*3/uL — ABNORMAL LOW (ref 1.0–3.6)
MCH: 27.5 pg (ref 26.0–34.0)
MCHC: 32.8 g/dL (ref 32.0–36.0)
MCV: 83.8 fL (ref 80.0–100.0)
MONOS PCT: 8 %
Monocytes Absolute: 0.3 10*3/uL (ref 0.2–0.9)
NEUTROS PCT: 73 %
Neutro Abs: 3.1 10*3/uL (ref 1.4–6.5)
Platelets: 199 10*3/uL (ref 150–440)
RBC: 3.55 MIL/uL — AB (ref 3.80–5.20)
RDW: 15.8 % — AB (ref 11.5–14.5)
WBC: 4.3 10*3/uL (ref 3.6–11.0)

## 2016-02-27 LAB — BASIC METABOLIC PANEL
ANION GAP: 4 — AB (ref 5–15)
BUN: 13 mg/dL (ref 6–20)
CO2: 27 mmol/L (ref 22–32)
Calcium: 8.6 mg/dL — ABNORMAL LOW (ref 8.9–10.3)
Chloride: 102 mmol/L (ref 101–111)
Creatinine, Ser: 0.7 mg/dL (ref 0.44–1.00)
GFR calc Af Amer: >60 mL/min (ref >60)
GFR calc non Af Amer: >60 mL/min (ref >60)
GLUCOSE: 134 mg/dL — AB (ref 65–99)
POTASSIUM: 3.9 mmol/L (ref 3.5–5.1)
Sodium: 133 mmol/L — ABNORMAL LOW (ref 135–145)

## 2016-02-27 MED ORDER — SODIUM CHLORIDE 0.9% FLUSH
10.0000 mL | INTRAVENOUS | Status: DC | PRN
  Administered 2016-02-27: 10 mL via INTRAVENOUS
  Filled 2016-02-27: qty 10

## 2016-02-27 MED ORDER — HEPARIN SOD (PORK) LOCK FLUSH 100 UNIT/ML IV SOLN
500.0000 [IU] | Freq: Once | INTRAVENOUS | Status: AC
  Administered 2016-02-27: 500 [IU] via INTRAVENOUS

## 2016-02-28 LAB — CANCER ANTIGEN 27.29: CA 27.29: 11.7 U/mL (ref 0.0–38.6)

## 2016-03-13 DIAGNOSIS — F411 Generalized anxiety disorder: Secondary | ICD-10-CM | POA: Diagnosis not present

## 2016-03-19 ENCOUNTER — Ambulatory Visit (INDEPENDENT_AMBULATORY_CARE_PROVIDER_SITE_OTHER): Payer: Medicare Other | Admitting: General Surgery

## 2016-03-19 ENCOUNTER — Encounter: Payer: Self-pay | Admitting: General Surgery

## 2016-03-19 VITALS — BP 104/56 | HR 88 | Resp 12 | Ht 66.0 in | Wt 219.0 lb

## 2016-03-19 DIAGNOSIS — Z171 Estrogen receptor negative status [ER-]: Secondary | ICD-10-CM | POA: Diagnosis not present

## 2016-03-19 DIAGNOSIS — C50412 Malignant neoplasm of upper-outer quadrant of left female breast: Secondary | ICD-10-CM | POA: Diagnosis not present

## 2016-03-19 NOTE — Progress Notes (Signed)
Patient ID: Suzanne Strong, female   DOB: 17-May-1945, 71 y.o.   MRN: [id number]  Chief Complaint  Patient presents with  . Follow-up    HPI Suzanne Strong is a 71 y.o. female. Here today for follow up left breast lumpectomy on 10-02-15. She states she is doing well. She is has finished her radiation before Thanksgiving. She stats the neuropathy seems to be the most troublesome for her to adjust to.  Marland KitchenHPI  Past Medical History:  Diagnosis Date  . Anemia    iron deficiency and B12 deficiency  . Anxiety   . Breast cancer ([place]) 02/25/2015   upper-outer quadrant of left female breast, Triple Negative. Neo-adjuvant chemo with complete pathologic response.   . Cervical dysplasia    conization  . Diabetes mellitus without complication ([place])   . Diverticulosis   . Fever 04/11/2015  . Gastroparesis    related to previous radiation tx  . GERD (gastroesophageal reflux disease)   . Hyperlipidemia   . Hypertension   . Shingles    Hx of  . 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 APPENDECTOMY    . BREAST BIOPSY Left 02/25/2015   INVASIVE MAMMARY CARCINOMA,Triple negative.  [name] BREAST CYST ASPIRATION    . BREAST LUMPECTOMY WITH NEEDLE LOCALIZATION Left 10/02/2015   Procedure: BREAST LUMPECTOMY WITH NEEDLE LOCALIZATION;  Surgeon: [name], MD;  Location: [facility] ORS;  Service: General;  Laterality: Left;  . BREAST LUMPECTOMY WITH SENTINEL LYMPH NODE BIOPSY Left 10/02/2015   Procedure: LEFT BREAST WIDE EXCISION WITH SENTINEL LYMPH NODE BX;  Surgeon: [name], MD;  Location: [facility] ORS;  Service: General;  Laterality: Left;  . BREAST SURGERY     breast biopsy benign  . CHOLECYSTECTOMY    . ESOPHAGOGASTRODUODENOSCOPY  07/2009   normal with few gastric polyps  . MOLE REMOVAL    . PORT-A-CATH REMOVAL    . PORTACATH PLACEMENT    . PORTACATH PLACEMENT Right 03/12/2015   Procedure: INSERTION PORT-A-CATH;  Surgeon: [name],  MD;  Location: [facility] ORS;  Service: General;  Laterality: Right;  . RADICAL NECK DISSECTION    . TONSILLECTOMY     cancer treated with chemo and radiation    Family History  Problem Relation Age of Onset  . Osteoporosis Mother   . Hyperlipidemia Mother   . Depression Mother   . Cancer Mother     skin cancer ? basal cell and lung ca heavy smoker  . Alcohol abuse Father   . Cancer Father     skin CA ? basal cell  . Heart disease Father     CHF  . Hyperlipidemia Brother   . Hypertension Brother     Social History Social History  Substance Use Topics  . Smoking status: Former Smoker    Packs/day: 0.50    Years: 6.00    Types: Cigarettes    Quit date: 03/10/1984  . Smokeless tobacco: Never Used  . Alcohol use No    Allergies  Allergen Reactions  . Amoxicillin     REACTION: rash  . Fluconazole     REACTION: hives    Current Outpatient Prescriptions  Medication Sig Dispense Refill  . ALPRAZolam (XANAX) 0.5 MG tablet Take 1 tablet (0.5 mg total) by mouth at bedtime as needed for sleep. 30 tablet 3  . amitriptyline (ELAVIL) 10 MG tablet Take 10 mg by mouth at bedtime.    [name] atorvastatin (LIPITOR)  10 MG tablet take 1 tablet by mouth once daily 30 tablet 5  . b complex vitamins tablet Take 1 tablet by mouth daily.      . cyanocobalamin (,VITAMIN B-12,) 1000 MCG/ML injection Inject 1,000 mcg into the muscle every 30 (thirty) days.      . fluticasone (FLONASE) 50 MCG/ACT nasal spray instill 2 sprays into each nostril once daily 16 g 11  . gabapentin (NEURONTIN) 300 MG capsule take 1 tablet by mouth every morning and 2 tablet every evening 90 capsule 3  . glipiZIDE (GLUCOTROL) 10 MG tablet Take 20 mg by mouth daily before breakfast. Take 2 tabs daily on the day of chemotherapy and the day after chemo    . JANUMET XR 50-1000 MG TB24 Take 1 tablet by mouth 2 (two) times daily.   0  . lidocaine-prilocaine (EMLA) cream Apply 1 application topically as needed. Apply to port when going  through Chemo    . lisinopril (PRINIVIL,ZESTRIL) 5 MG tablet take 1 tablet by mouth once daily 30 tablet 5  . mirtazapine (REMERON) 30 MG tablet take 2 tablet by mouth at bedtime  0  . omeprazole (PRILOSEC) 20 MG capsule take 1 capsule by mouth once daily 30 capsule 2  . ondansetron (ZOFRAN) 8 MG tablet take 1 tablet by mouth twice a day (START ON THE 3RD DAY AFTER CHEMOTHERAPY) 30 tablet 1  . potassium chloride (K-DUR) 10 MEQ tablet take 1 tablet by mouth once daily 30 tablet 2  . rOPINIRole (REQUIP) 1 MG tablet take 1 tablet by mouth every morning if needed and 3 tablets by mouth at bedtime 120 tablet 5  . SLOW-MAG 71.5-119 MG TBEC SR tablet take 1 tablet by mouth daily 30 tablet 2   No current facility-administered medications for this visit.    Facility-Administered Medications Ordered in Other Visits  Medication Dose Route Frequency Provider Last Rate Last Dose  . sodium chloride flush (NS) 0.9 % injection 10 mL  10 mL Intravenous PRN Cammie Sickle, MD   10 mL at 09/11/15 0845    Review of Systems Review of Systems  Constitutional: Negative.   Respiratory: Negative.   Cardiovascular: Negative.     Blood pressure (!) 104/56, pulse 88, resp. rate 12, height 5\' 6"  (1.676 m), weight 219 lb (99.3 kg).  Physical Exam Physical Exam  Constitutional: She is oriented to person, place, and time. She appears well-developed and well-nourished.  HENT:  Mouth/Throat: Oropharynx is clear and moist.  Eyes: Conjunctivae are normal. No scleral icterus.  Neck: Neck supple.  Cardiovascular: Normal rate and regular rhythm.   Murmur heard.  Systolic murmur is present with a grade of 2/6  Pulmonary/Chest: Effort normal and breath sounds normal. Right breast exhibits no inverted nipple, no mass, no nipple discharge, no skin change and no tenderness. Left breast exhibits no inverted nipple, no mass, no nipple discharge, no skin change and no tenderness.    Left breast lumpectomy site well  healed.  Lymphadenopathy:    She has no cervical adenopathy.    She has no axillary adenopathy.  Neurological: She is alert and oriented to person, place, and time.  Skin: Skin is warm and dry.  Psychiatric: Her behavior is normal.       Assessment    Doing well status post breast conservation.    Plan         The patient has been asked to return to the office in 6 months with an office visit.  Will arrange for a bilateral diagnostic mammogram this month. This has been scheduled for Monday, 04-06-16 at 2:20 pm at the [facility]. Patient aware of date and time.   This information has been scribed by Karie Fetch RN, BSN,BC.    [name] 03/20/2016, 10:14 AM

## 2016-03-19 NOTE — Patient Instructions (Addendum)
The patient is aware to call back for any questions or concerns. The patient has been asked to return to the office in 6 months with an office visit. Will arrange for bilateral diagnostic mammogram this month.

## 2016-03-20 ENCOUNTER — Encounter: Payer: Self-pay | Admitting: General Surgery

## 2016-04-02 ENCOUNTER — Ambulatory Visit: Payer: Medicare Other | Admitting: Internal Medicine

## 2016-04-02 ENCOUNTER — Other Ambulatory Visit: Payer: Medicare Other

## 2016-04-03 ENCOUNTER — Inpatient Hospital Stay: Payer: Medicare Other

## 2016-04-03 ENCOUNTER — Inpatient Hospital Stay (HOSPITAL_BASED_OUTPATIENT_CLINIC_OR_DEPARTMENT_OTHER): Payer: Medicare Other | Admitting: Internal Medicine

## 2016-04-03 ENCOUNTER — Inpatient Hospital Stay: Payer: Medicare Other | Attending: Internal Medicine

## 2016-04-03 VITALS — BP 100/62 | HR 87 | Temp 97.2°F | Wt 220.2 lb

## 2016-04-03 DIAGNOSIS — K219 Gastro-esophageal reflux disease without esophagitis: Secondary | ICD-10-CM | POA: Insufficient documentation

## 2016-04-03 DIAGNOSIS — E785 Hyperlipidemia, unspecified: Secondary | ICD-10-CM

## 2016-04-03 DIAGNOSIS — Z171 Estrogen receptor negative status [ER-]: Secondary | ICD-10-CM

## 2016-04-03 DIAGNOSIS — Z8619 Personal history of other infectious and parasitic diseases: Secondary | ICD-10-CM | POA: Diagnosis not present

## 2016-04-03 DIAGNOSIS — N879 Dysplasia of cervix uteri, unspecified: Secondary | ICD-10-CM | POA: Insufficient documentation

## 2016-04-03 DIAGNOSIS — Z85819 Personal history of malignant neoplasm of unspecified site of lip, oral cavity, and pharynx: Secondary | ICD-10-CM

## 2016-04-03 DIAGNOSIS — Z7984 Long term (current) use of oral hypoglycemic drugs: Secondary | ICD-10-CM | POA: Diagnosis not present

## 2016-04-03 DIAGNOSIS — Z9221 Personal history of antineoplastic chemotherapy: Secondary | ICD-10-CM | POA: Diagnosis not present

## 2016-04-03 DIAGNOSIS — G629 Polyneuropathy, unspecified: Secondary | ICD-10-CM

## 2016-04-03 DIAGNOSIS — C50412 Malignant neoplasm of upper-outer quadrant of left female breast: Secondary | ICD-10-CM

## 2016-04-03 DIAGNOSIS — Z923 Personal history of irradiation: Secondary | ICD-10-CM | POA: Diagnosis not present

## 2016-04-03 DIAGNOSIS — I10 Essential (primary) hypertension: Secondary | ICD-10-CM | POA: Insufficient documentation

## 2016-04-03 DIAGNOSIS — Z79899 Other long term (current) drug therapy: Secondary | ICD-10-CM

## 2016-04-03 DIAGNOSIS — Z853 Personal history of malignant neoplasm of breast: Secondary | ICD-10-CM

## 2016-04-03 DIAGNOSIS — D538 Other specified nutritional anemias: Secondary | ICD-10-CM | POA: Insufficient documentation

## 2016-04-03 DIAGNOSIS — D509 Iron deficiency anemia, unspecified: Secondary | ICD-10-CM | POA: Insufficient documentation

## 2016-04-03 DIAGNOSIS — F419 Anxiety disorder, unspecified: Secondary | ICD-10-CM

## 2016-04-03 DIAGNOSIS — Z87891 Personal history of nicotine dependence: Secondary | ICD-10-CM | POA: Diagnosis not present

## 2016-04-03 DIAGNOSIS — Z8719 Personal history of other diseases of the digestive system: Secondary | ICD-10-CM | POA: Insufficient documentation

## 2016-04-03 DIAGNOSIS — E119 Type 2 diabetes mellitus without complications: Secondary | ICD-10-CM

## 2016-04-03 DIAGNOSIS — R17 Unspecified jaundice: Secondary | ICD-10-CM | POA: Insufficient documentation

## 2016-04-03 DIAGNOSIS — Z95828 Presence of other vascular implants and grafts: Secondary | ICD-10-CM

## 2016-04-03 LAB — COMPREHENSIVE METABOLIC PANEL
ALBUMIN: 3.3 g/dL — AB (ref 3.5–5.0)
ALT: 18 U/L (ref 14–54)
ANION GAP: 6 (ref 5–15)
AST: 29 U/L (ref 15–41)
Alkaline Phosphatase: 80 U/L (ref 38–126)
BUN: 12 mg/dL (ref 6–20)
CALCIUM: 8.8 mg/dL — AB (ref 8.9–10.3)
CHLORIDE: 102 mmol/L (ref 101–111)
CO2: 26 mmol/L (ref 22–32)
Creatinine, Ser: 0.67 mg/dL (ref 0.44–1.00)
GFR calc non Af Amer: >60 mL/min (ref >60)
GLUCOSE: 115 mg/dL — AB (ref 65–99)
POTASSIUM: 4 mmol/L (ref 3.5–5.1)
SODIUM: 134 mmol/L — AB (ref 135–145)
Total Bilirubin: 0.4 mg/dL (ref 0.3–1.2)
Total Protein: 6.9 g/dL (ref 6.5–8.1)

## 2016-04-03 LAB — CBC WITH DIFFERENTIAL/PLATELET
BASOS PCT: 0 %
Basophils Absolute: 0 10*3/uL (ref 0–0.1)
EOS ABS: 0.1 10*3/uL (ref 0–0.7)
EOS PCT: 3 %
HCT: 30.1 % — ABNORMAL LOW (ref 35.0–47.0)
HEMOGLOBIN: 9.8 g/dL — AB (ref 12.0–16.0)
LYMPHS ABS: 0.6 10*3/uL — AB (ref 1.0–3.6)
Lymphocytes Relative: 15 %
MCH: 27.2 pg (ref 26.0–34.0)
MCHC: 32.7 g/dL (ref 32.0–36.0)
MCV: 83.2 fL (ref 80.0–100.0)
MONOS PCT: 7 %
Monocytes Absolute: 0.3 10*3/uL (ref 0.2–0.9)
NEUTROS PCT: 75 %
Neutro Abs: 3.3 10*3/uL (ref 1.4–6.5)
PLATELETS: 212 10*3/uL (ref 150–440)
RBC: 3.62 MIL/uL — ABNORMAL LOW (ref 3.80–5.20)
RDW: 16 % — ABNORMAL HIGH (ref 11.5–14.5)
WBC: 4.4 10*3/uL (ref 3.6–11.0)

## 2016-04-03 LAB — MAGNESIUM: MAGNESIUM: 1.6 mg/dL — AB (ref 1.7–2.4)

## 2016-04-03 MED ORDER — SODIUM CHLORIDE 0.9% FLUSH
10.0000 mL | INTRAVENOUS | Status: DC | PRN
  Administered 2016-04-03: 10 mL via INTRAVENOUS
  Filled 2016-04-03: qty 10

## 2016-04-03 MED ORDER — HEPARIN SOD (PORK) LOCK FLUSH 100 UNIT/ML IV SOLN
500.0000 [IU] | Freq: Once | INTRAVENOUS | Status: AC
  Administered 2016-04-03: 500 [IU] via INTRAVENOUS

## 2016-04-03 MED ORDER — AMITRIPTYLINE HCL 25 MG PO TABS: 25.0000 mg | ORAL_TABLET | Freq: Every day | ORAL | 3 refills | Status: DC

## 2016-04-03 NOTE — Progress Notes (Signed)
[facility] OFFICE PROGRESS NOTE  Patient Care Team: [name], MD as PCP - General [name], MD (General Surgery) [name], MD (Oncology)  Cancer Staging No matching staging information was found for the patient.    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 5 CM TUMOR MASS BIOPSIES POSITIVE FOR INVASIVE MAMMARY CARCINOMA TRIPLE NEGATIVE DISEASE(jANUARY, 2017)stage IIIa. ER/PR/her2 Neu-NEGATIVE  # Cytoxan and Adriamycin from March 18, 2015; carbo-taxol x4 cycles  # AUg 18th s/p Lumpec & SLNBx- Complete path CR [ypT0 ypsn0] s/p RT [oct 2017; Dr.Crystal]  3. MUGA scan of the heart shows ejection fraction to be 61% (January, 2017) 4     Carcinoma of upper-outer quadrant of left breast in female, estrogen receptor negative ([place])   11/21/2015 Initial Diagnosis    Carcinoma of upper-outer quadrant of left breast in female, estrogen receptor negative ([place])     INTERVAL HISTORY:  Suzanne Strong 71 y.o.  female pleasant patient above history of stage IIIa breast cancer currently Status post radiation is here for follow-up.   Patient continues to have moderate-Severe jaundice received tingling and numbness/associated pain in the legs mostly at nighttime. Patient is currently on Neurontin 300 mg daily. She did not tolerate Cymbalta well. She is current on amitriptyline 10 mg/day. Patient states hydrocodone helps with the pain. However does not want to "get addicted".. Denies any new lumps or bumps.   REVIEW OF SYSTEMS:  A complete 10 point review of system is done which is negative except mentioned above/history of present illness.   PAST MEDICAL HISTORY :  Past Medical History:  Diagnosis Date  . Anemia    iron deficiency and B12  deficiency  . Anxiety   . Breast cancer ([place]) 02/25/2015   upper-outer quadrant of left female breast, Triple Negative. Neo-adjuvant chemo with complete pathologic response.   . Cervical dysplasia    conization  . Diabetes mellitus without complication ([place])   . Diverticulosis   . Fever 04/11/2015  . Gastroparesis    related to previous radiation tx  . GERD (gastroesophageal reflux disease)   . Hyperlipidemia   . Hypertension   . Shingles    Hx of  . 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 APPENDECTOMY    . BREAST BIOPSY Left 02/25/2015   INVASIVE MAMMARY CARCINOMA,Triple negative.  [name] BREAST CYST ASPIRATION    . BREAST LUMPECTOMY WITH NEEDLE LOCALIZATION Left 10/02/2015   Procedure: BREAST LUMPECTOMY WITH NEEDLE LOCALIZATION;  Surgeon: [name], MD;  Location: [facility] ORS;  Service: General;  Laterality: Left;  . BREAST LUMPECTOMY WITH SENTINEL LYMPH NODE BIOPSY Left 10/02/2015   Procedure: LEFT BREAST WIDE EXCISION WITH SENTINEL LYMPH NODE BX;  Surgeon: [name], MD;  Location: [facility] ORS;  Service: General;  Laterality: Left;  . BREAST SURGERY     breast biopsy benign  . CHOLECYSTECTOMY    . ESOPHAGOGASTRODUODENOSCOPY  07/2009   normal with few gastric polyps  . MOLE REMOVAL    . PORT-A-CATH REMOVAL    . PORTACATH PLACEMENT    . PORTACATH PLACEMENT Right 03/12/2015   Procedure: INSERTION PORT-A-CATH;  Surgeon: [name], MD;  Location: [facility] ORS;  Service: General;  Laterality: Right;  .  RADICAL NECK DISSECTION    . TONSILLECTOMY     cancer treated with chemo and radiation    FAMILY HISTORY :   Family History  Problem Relation Age of Onset  . Osteoporosis Mother   . Hyperlipidemia Mother   . Depression Mother   . Cancer Mother     skin cancer ? basal cell and lung ca heavy smoker  . Alcohol abuse Father   . Cancer Father     skin CA ? basal cell  . Heart disease Father      CHF  . Hyperlipidemia Brother   . Hypertension Brother     SOCIAL HISTORY:   Social History  Substance Use Topics  . Smoking status: Former Smoker    Packs/day: 0.50    Years: 6.00    Types: Cigarettes    Quit date: 03/10/1984  . Smokeless tobacco: Never Used  . Alcohol use No    ALLERGIES:  is allergic to amoxicillin and fluconazole.  MEDICATIONS:  Current Outpatient Prescriptions  Medication Sig Dispense Refill  . ALPRAZolam (XANAX) 0.5 MG tablet Take 1 tablet (0.5 mg total) by mouth at bedtime as needed for sleep. 30 tablet 3  . amitriptyline (ELAVIL) 25 MG tablet Take 1 tablet (25 mg total) by mouth at bedtime. 30 tablet 3  . atorvastatin (LIPITOR) 10 MG tablet take 1 tablet by mouth once daily 30 tablet 5  . b complex vitamins tablet Take 1 tablet by mouth daily.      . cyanocobalamin (,VITAMIN B-12,) 1000 MCG/ML injection Inject 1,000 mcg into the muscle every 30 (thirty) days.      . fluticasone (FLONASE) 50 MCG/ACT nasal spray instill 2 sprays into each nostril once daily 16 g 11  . gabapentin (NEURONTIN) 300 MG capsule take 1 tablet by mouth every morning and 2 tablet every evening 90 capsule 3  . glipiZIDE (GLUCOTROL) 10 MG tablet Take 10 mg by mouth daily before breakfast.     . JANUMET XR 50-1000 MG TB24 Take 1 tablet by mouth 2 (two) times daily.   0  . lidocaine-prilocaine (EMLA) cream Apply 1 application topically as needed. Apply to port when going through Chemo    . lisinopril (PRINIVIL,ZESTRIL) 5 MG tablet take 1 tablet by mouth once daily 30 tablet 5  . mirtazapine (REMERON) 30 MG tablet take 2 tablet by mouth at bedtime  0  . omeprazole (PRILOSEC) 20 MG capsule take 1 capsule by mouth once daily 30 capsule 2  . potassium chloride (K-DUR) 10 MEQ tablet take 1 tablet by mouth once daily 30 tablet 2  . rOPINIRole (REQUIP) 1 MG tablet take 1 tablet by mouth every morning if needed and 3 tablets by mouth at bedtime 120 tablet 5  . SLOW-MAG 71.5-119 MG TBEC SR tablet  take 1 tablet by mouth daily 30 tablet 2   No current facility-administered medications for this visit.    Facility-Administered Medications Ordered in Other Visits  Medication Dose Route Frequency Provider Last Rate Last Dose  . sodium chloride flush (NS) 0.9 % injection 10 mL  10 mL Intravenous PRN Cammie Sickle, MD   10 mL at 09/11/15 0845    PHYSICAL EXAMINATION: ECOG PERFORMANCE STATUS: 0 - Asymptomatic  BP 100/62 (BP Location: Left Arm, Patient Position: Sitting)   Pulse 87   Temp 97.2 F (36.2 C) (Tympanic)   Wt 220 lb 4 oz (99.9 kg)   BMI 35.55 kg/m   Filed Weights   04/03/16 1032  Weight: 220 lb 4 oz (99.9 kg)    GENERAL: Well-nourished well-developed; Alert, no distress and comfortable.   Alone. EYES: no pallor or icterus OROPHARYNX: no thrush or ulceration; good dentition  NECK: supple, no masses felt LYMPH:  no palpable lymphadenopathy in the cervical, axillary or inguinal regions LUNGS: clear to auscultation and  No wheeze or crackles HEART/CVS: regular rate & rhythm and no murmurs; No lower extremity edema ABDOMEN:abdomen soft, non-tender and normal bowel sounds Musculoskeletal:no cyanosis of digits and no clubbing  PSYCH: alert & oriented x 3 with fluent speech NEURO: no focal motor/sensory deficits SKIN:  no rashes or significant lesions  LABORATORY DATA:  I have reviewed the data as listed    Component Value Date/Time   NA 134 (L) 04/03/2016 1005   NA 142 03/01/2014 0846   K 4.0 04/03/2016 1005   K 4.3 03/01/2014 0846   CL 102 04/03/2016 1005   CL 102 03/01/2014 0846   CO2 26 04/03/2016 1005   CO2 28 03/01/2014 0846   GLUCOSE 115 (H) 04/03/2016 1005   GLUCOSE 219 (H) 03/01/2014 0846   BUN 12 04/03/2016 1005   BUN 12 03/01/2014 0846   CREATININE 0.67 04/03/2016 1005   CREATININE 0.95 03/01/2014 0846   CALCIUM 8.8 (L) 04/03/2016 1005   CALCIUM 8.6 03/01/2014 0846   PROT 6.9 04/03/2016 1005   PROT 7.5 03/01/2014 0846   ALBUMIN 3.3 (L)  04/03/2016 1005   ALBUMIN 3.4 03/01/2014 0846   AST 29 04/03/2016 1005   AST 20 03/01/2014 0846   ALT 18 04/03/2016 1005   ALT 28 03/01/2014 0846   ALKPHOS 80 04/03/2016 1005   ALKPHOS 122 (H) 03/01/2014 0846   BILITOT 0.4 04/03/2016 1005   BILITOT 0.2 03/01/2014 0846   GFRNONAA >60 04/03/2016 1005   GFRNONAA >60 03/01/2014 0846   GFRNONAA >60 08/24/2013 1015   GFRAA >60 04/03/2016 1005   GFRAA >60 03/01/2014 0846   GFRAA >60 08/24/2013 1015    No results found for: SPEP, UPEP  Lab Results  Component Value Date   WBC 4.4 04/03/2016   NEUTROABS 3.3 04/03/2016   HGB 9.8 (L) 04/03/2016   HCT 30.1 (L) 04/03/2016   MCV 83.2 04/03/2016   PLT 212 04/03/2016      Chemistry      Component Value Date/Time   NA 134 (L) 04/03/2016 1005   NA 142 03/01/2014 0846   K 4.0 04/03/2016 1005   K 4.3 03/01/2014 0846   CL 102 04/03/2016 1005   CL 102 03/01/2014 0846   CO2 26 04/03/2016 1005   CO2 28 03/01/2014 0846   BUN 12 04/03/2016 1005   BUN 12 03/01/2014 0846   CREATININE 0.67 04/03/2016 1005   CREATININE 0.95 03/01/2014 0846      Component Value Date/Time   CALCIUM 8.8 (L) 04/03/2016 1005   CALCIUM 8.6 03/01/2014 0846   ALKPHOS 80 04/03/2016 1005   ALKPHOS 122 (H) 03/01/2014 0846   AST 29 04/03/2016 1005   AST 20 03/01/2014 0846   ALT 18 04/03/2016 1005   ALT 28 03/01/2014 0846   BILITOT 0.4 04/03/2016 1005   BILITOT 0.2 03/01/2014 0846       RADIOGRAPHIC STUDIES: I have personally reviewed the radiological images as listed and agreed with the findings in the report. No results found.   ASSESSMENT & PLAN:  Carcinoma of upper-outer quadrant of left breast in female, estrogen receptor negative ([place]) Left breast cancer stage III  ER/PR/her 2 Neu NEG. Currently  on neoadjuvant chemotherapy; excellent response. S/p Surgery- complete pathologic response. No clinical evidence of progression or recurrence.  # PN- G-2  on 300 three pills a day;  Increased amitryptline to 25  qhs- new script given. Discussed [id number]; Wants to try accupuncture. If not better recommend evaluation with neurology.  # Mild anemia secondary to bone marrow suppression from previous chemotherapy. Hemoglobin 9.5. Monitor for now.  # Hypomagnesemia- 1.4; on home supplementation;will call wit today labs- STOP k supp at home- K was 4 today.   # follow up in 8 weeks port flush/ # Follow up in 4 months/labs/port flush.    Orders Placed This Encounter  Procedures  . Magnesium    Standing Status:   Future    Number of Occurrences:   1    Standing Expiration Date:   04/03/2017  . Magnesium    Standing Status:   Future    Standing Expiration Date:   04/03/2017  . Magnesium    Standing Status:   Future    Standing Expiration Date:   04/03/2017  . Magnesium    Standing Status:   Future    Standing Expiration Date:   04/03/2017  . Comprehensive metabolic panel    Standing Status:   Future    Standing Expiration Date:   04/03/2017  . CBC with Differential    Standing Status:   Future    Standing Expiration Date:   04/03/2017  . Cancer antigen 27.29    Standing Status:   Future    Standing Expiration Date:   04/03/2017  . CBC with Differential    Standing Status:   Future    Standing Expiration Date:   04/03/2017  . Basic metabolic panel    Standing Status:   Future    Standing Expiration Date:   04/03/2017      Cammie Sickle, MD 04/03/2016 2:15 PM

## 2016-04-03 NOTE — Progress Notes (Signed)
Patient here today for follow up. Patient c/o continuing neuropathy that is not getting any better, unable to sleep at night due to leg pain

## 2016-04-03 NOTE — Assessment & Plan Note (Addendum)
Left breast cancer stage III  ER/PR/her 2 Neu NEG. Currently on neoadjuvant chemotherapy; excellent response. S/p Surgery- complete pathologic response. No clinical evidence of progression or recurrence.  # PN- G-2  on 300 three pills a day;  Increased amitryptline to 25 qhs- new script given. Discussed [id number]; Wants to try accupuncture. If not better recommend evaluation with neurology.  # Mild anemia secondary to bone marrow suppression from previous chemotherapy. Hemoglobin 9.5. Monitor for now.  # Hypomagnesemia- 1.4; on home supplementation;will call wit today labs- STOP k supp at home- K was 4 today.   # follow up in 8 weeks port flush/ # Follow up in 4 months/labs/port flush.

## 2016-04-04 LAB — CANCER ANTIGEN 27.29: CA 27.29: 11.8 U/mL (ref 0.0–38.6)

## 2016-04-06 ENCOUNTER — Ambulatory Visit
Admission: RE | Admit: 2016-04-06 | Discharge: 2016-04-06 | Disposition: A | Discharge status: Home / Self Care | Payer: Medicare Other | Source: Ambulatory Visit | Attending: General Surgery | Admitting: General Surgery

## 2016-04-06 ENCOUNTER — Telehealth: Payer: Self-pay | Admitting: *Deleted

## 2016-04-06 ENCOUNTER — Telehealth: Payer: Self-pay | Admitting: Family Medicine

## 2016-04-06 ENCOUNTER — Other Ambulatory Visit: Payer: Self-pay | Admitting: General Surgery

## 2016-04-06 ENCOUNTER — Telehealth: Payer: Self-pay

## 2016-04-06 DIAGNOSIS — C50412 Malignant neoplasm of upper-outer quadrant of left female breast: Secondary | ICD-10-CM | POA: Diagnosis not present

## 2016-04-06 DIAGNOSIS — Z171 Estrogen receptor negative status [ER-]: Principal | ICD-10-CM

## 2016-04-06 DIAGNOSIS — R928 Other abnormal and inconclusive findings on diagnostic imaging of breast: Secondary | ICD-10-CM | POA: Diagnosis not present

## 2016-04-06 MED ORDER — AMITRIPTYLINE HCL 50 MG PO TABS: 50.0000 mg | ORAL_TABLET | Freq: Every day | ORAL | 3 refills | Status: DC

## 2016-04-06 NOTE — Telephone Encounter (Signed)
What is she taking it for ? When was her last refill?  thanks

## 2016-04-06 NOTE — Telephone Encounter (Signed)
Pt left  VM on triage phone requesting refill on Tussionex cough syrup.  Best number to call is [phone]

## 2016-04-06 NOTE — Telephone Encounter (Signed)
This has to be given at a visit since it has to be printed- if he cough is that bad we should probably check her out as well. (especially if her immune system is at a low)   Please schedule appt for first available  Thanks   If she is unable to come in-please alert me

## 2016-04-06 NOTE — Telephone Encounter (Signed)
Notified patient as instructed, patient pleased °

## 2016-04-06 NOTE — Telephone Encounter (Signed)
Pt said that she has a dry "hacky cough" pt said it started this Saturday. Pt requested medication because she has taken this in the past with no issues. Pt said that her old oncologist Dr. Jeb Levering prescribed it to her years ago, and that was the last time she has taken it. Pt said her cough is pretty bad at night and it is taking her breath away and making her feel like she is wheezing some. Pt said she has no fever and no congestion

## 2016-04-06 NOTE — Telephone Encounter (Signed)
-----   Message from [name], MD sent at 04/06/2016  4:23 PM EST ----- Please let the patient know I looked her mammograms from this afternoon and they look good.

## 2016-04-06 NOTE — Telephone Encounter (Signed)
Pt left vm on clinical line.  Pt reports that md was supposed to increase her amitriptyline dosing. Pt states that she was already on amitriptyline 25 mg and md only sent a RF for the same dose.  She also reports intermittent cough-"feels like my lungs have a  rattling cough. I don't have a fever." Pt asking for a return phone call at [phone].  RN spoke with Dr. Bradd Canary. md will send rx in for amitriptyline 50 mg.  Patient needs to see pcp if cough persist and she can be referred to neurologist  Call attempt x 2. Left msgs asking pt to return my phone call. New rx for amitriptyline sent into pt's pharmacy.   Pt returned my phone call at 1545-would like to hold off on neurology referral at this time as she would like to consider accupuncture for symptoms relief. She has an apt for acupuncture next week. She will call our office back should she desire an apt with neurology. She understands to contact her pcp for an apt for her intermittent cough if symptoms do not improve. She understands that a new rx for amitriptyline 50 mg was sent to her pharmacy. She did pick up the 25 mg dosing of amitriptyline last week and will finish this dosing. She states that she would just take 2 tablets of the 25 mg amitriptyline until she runs out and then will fill her new rx dosing. She thanked me for calling her.

## 2016-04-07 ENCOUNTER — Encounter: Payer: Self-pay | Admitting: Family Medicine

## 2016-04-07 ENCOUNTER — Ambulatory Visit (INDEPENDENT_AMBULATORY_CARE_PROVIDER_SITE_OTHER): Payer: Medicare Other | Admitting: Family Medicine

## 2016-04-07 VITALS — BP 124/58 | HR 89 | Temp 98.6°F | Ht 66.0 in | Wt 219.8 lb

## 2016-04-07 DIAGNOSIS — J209 Acute bronchitis, unspecified: Secondary | ICD-10-CM | POA: Diagnosis not present

## 2016-04-07 MED ORDER — ALBUTEROL SULFATE HFA 108 (90 BASE) MCG/ACT IN AERS: 2.0000 | INHALATION_SPRAY | RESPIRATORY_TRACT | 0 refills | Status: DC | PRN

## 2016-04-07 MED ORDER — AZITHROMYCIN 250 MG PO TABS: ORAL_TABLET | ORAL | 0 refills | Status: DC

## 2016-04-07 MED ORDER — HYDROCOD POLST-CPM POLST ER 10-8 MG/5ML PO SUER: 5.0000 mL | Freq: Two times a day (BID) | ORAL | 0 refills | Status: DC | PRN

## 2016-04-07 NOTE — Progress Notes (Signed)
Subjective:    Patient ID: Suzanne Strong, female    DOB: 21-May-1945, 71 y.o.   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 Acute bronchitis 04/07/2016  . Obstructive sleep apnea 01/01/2016  . Carcinoma of upper-outer quadrant of left breast in female, estrogen receptor negative ([place]) 11/21/2015  . Hypomagnesemia 08/28/2015  . Fever 04/11/2015  . Diabetes mellitus without complication ([place]) [id number]  . Initial Medicare annual wellness visit 10/16/2014  . Estrogen deficiency 10/16/2014  . Colon cancer screening 10/16/2014  . Controlled type 2 diabetes mellitus without complication ([place]) [id number]  . RLS (restless legs syndrome) 02/21/2014  . Chronic neck pain 07/25/2010  . Depression 07/07/2010  . Routine general medical examination at a health care facility 06/26/2010  . B12 deficiency 09/04/2009  . Anemia, iron deficiency 08/29/2009  . ANXIETY 08/26/2009  . GASTROPARESIS 08/26/2009  . Vitamin D deficiency 07/19/2008  . Disorder of bone and cartilage 07/19/2007  . Hyperlipidemia 07/18/2007  . EDEMA 07/18/2007   Past Medical History:  Diagnosis Date  . Anemia    iron deficiency and B12  deficiency  . Anxiety   . Breast cancer ([place]) 02/25/2015   upper-outer quadrant of left female breast, Triple Negative. Neo-adjuvant chemo with complete pathologic response.   . Cervical dysplasia    conization  . Diabetes mellitus without complication ([place])   . Diverticulosis   . Fever 04/11/2015  . Gastroparesis    related to previous radiation tx  . GERD (gastroesophageal reflux disease)   . Hyperlipidemia   . Hypertension   . Shingles    Hx of  . 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 APPENDECTOMY    . BREAST BIOPSY Left 02/25/2015   INVASIVE MAMMARY CARCINOMA,Triple negative.  [name] BREAST CYST ASPIRATION    . BREAST EXCISIONAL BIOPSY Left 12/2015   surgery  . BREAST LUMPECTOMY WITH NEEDLE LOCALIZATION Left 10/02/2015   Procedure: BREAST LUMPECTOMY WITH NEEDLE LOCALIZATION;  Surgeon: [name], MD;  Location: [facility] ORS;  Service: General;  Laterality: Left;  . BREAST LUMPECTOMY WITH SENTINEL LYMPH NODE BIOPSY Left 10/02/2015   Procedure: LEFT BREAST WIDE EXCISION WITH SENTINEL LYMPH NODE BX;  Surgeon: [name], MD;  Location: [facility] ORS;  Service: General;  Laterality: Left;  . BREAST SURGERY     breast biopsy benign  . CHOLECYSTECTOMY    . ESOPHAGOGASTRODUODENOSCOPY  07/2009   normal with few gastric polyps  . MOLE REMOVAL    .  PORT-A-CATH REMOVAL    . PORTACATH PLACEMENT    . PORTACATH PLACEMENT Right 03/12/2015   Procedure: INSERTION PORT-A-CATH;  Surgeon: [name], MD;  Location: [facility] ORS;  Service: General;  Laterality: Right;  . RADICAL NECK DISSECTION    . TONSILLECTOMY     cancer treated with chemo and radiation   Social History  Substance Use Topics  . Smoking status: Former Smoker    Packs/day: 0.50    Years: 6.00    Types: Cigarettes    Quit date: 03/10/1984  . Smokeless tobacco: Never Used  . Alcohol use No   Family History  Problem Relation Age of Onset  . Osteoporosis Mother   .  Hyperlipidemia Mother   . Depression Mother   . Cancer Mother     skin cancer ? basal cell and lung ca heavy smoker  . Alcohol abuse Father   . Cancer Father     skin CA ? basal cell  . Heart disease Father     CHF  . Hyperlipidemia Brother   . Hypertension Brother    Allergies  Allergen Reactions  . Amoxicillin     REACTION: rash  . Fluconazole     REACTION: hives   Current Outpatient Prescriptions on File Prior to Visit  Medication Sig Dispense Refill  . ALPRAZolam (XANAX) 0.5 MG tablet Take 1 tablet (0.5 mg total) by mouth at bedtime as needed for sleep. 30 tablet 3  . amitriptyline (ELAVIL) 50 MG tablet Take 1 tablet (50 mg total) by mouth at bedtime. 30 tablet 3  . atorvastatin (LIPITOR) 10 MG tablet take 1 tablet by mouth once daily 30 tablet 5  . b complex vitamins tablet Take 1 tablet by mouth daily.      . cyanocobalamin (,VITAMIN B-12,) 1000 MCG/ML injection Inject 1,000 mcg into the muscle every 30 (thirty) days.      . fluticasone (FLONASE) 50 MCG/ACT nasal spray instill 2 sprays into each nostril once daily 16 g 11  . gabapentin (NEURONTIN) 300 MG capsule take 1 tablet by mouth every morning and 2 tablet every evening 90 capsule 3  . glipiZIDE (GLUCOTROL) 10 MG tablet Take 10 mg by mouth daily before breakfast.     . JANUMET XR 50-1000 MG TB24 Take 1 tablet by mouth 2 (two) times daily.   0  . lidocaine-prilocaine (EMLA) cream Apply 1 application topically as needed. Apply to port when going through Chemo    . lisinopril (PRINIVIL,ZESTRIL) 5 MG tablet take 1 tablet by mouth once daily 30 tablet 5  . mirtazapine (REMERON) 30 MG tablet take 2 tablet by mouth at bedtime  0  . omeprazole (PRILOSEC) 20 MG capsule take 1 capsule by mouth once daily 30 capsule 2  . potassium chloride (K-DUR) 10 MEQ tablet take 1 tablet by mouth once daily 30 tablet 2  . rOPINIRole (REQUIP) 1 MG tablet take 1 tablet by mouth every morning if needed and 3 tablets by mouth at bedtime 120 tablet  5  . SLOW-MAG 71.5-119 MG TBEC SR tablet take 1 tablet by mouth daily 30 tablet 2   Current Facility-Administered Medications on File Prior to Visit  Medication Dose Route Frequency Provider Last Rate Last Dose  . sodium chloride flush (NS) 0.9 % injection 10 mL  10 mL Intravenous PRN Cammie Sickle, MD   10 mL at 09/11/15 0845    Review of Systems  Constitutional: Positive for appetite change and fatigue. Negative for fever.  HENT:  Positive for congestion, postnasal drip, rhinorrhea, sinus pressure, sneezing and sore throat. Negative for ear pain.   Eyes: Negative for pain and discharge.  Respiratory: Positive for cough. Negative for shortness of breath, wheezing and stridor.   Cardiovascular: Negative for chest pain.  Gastrointestinal: Negative for diarrhea, nausea and vomiting.  Genitourinary: Negative for frequency, hematuria and urgency.  Musculoskeletal: Negative for arthralgias and myalgias.  Skin: Negative for rash.  Neurological: Positive for headaches. Negative for dizziness, weakness and light-headedness.  Psychiatric/Behavioral: Negative for confusion and dysphoric mood.       Objective:   Physical Exam  Constitutional: She appears well-developed and well-nourished. No distress.  Well appearing   HENT:  Head: Normocephalic and atraumatic.  Right Ear: External ear normal.  Left Ear: External ear normal.  Mouth/Throat: Oropharynx is clear and moist.  Nares are injected and congested  No sinus tenderness Clear rhinorrhea and post nasal drip   Baseline post surgical changes of throat   Eyes: Conjunctivae and EOM are normal. Pupils are equal, round, and reactive to light. Right eye exhibits no discharge. Left eye exhibits no discharge.  Neck: Normal range of motion. Neck supple.  Cardiovascular: Normal rate and normal heart sounds.   Pulmonary/Chest: Effort normal and breath sounds normal. No respiratory distress. She has no wheezes. She has no rales. She exhibits  no tenderness.  Harsh bs with good air exch No rales  Lymphadenopathy:    She has no cervical adenopathy.  Neurological: She is alert.  Skin: Skin is warm and dry. No rash noted.  Psychiatric: She has a normal mood and affect.          Assessment & Plan:   Problem List Items Addressed This Visit      Respiratory   Acute bronchitis    From viral uri in pt with bone marrow suppression  Re assuring exam-wheeze only on forced expiration  rec guaifenesin /fluids/rest tussionex with caution (has used before) zpak as directed  Albuterol mdi with inst for use (prn only)  Update if not starting to improve in a week or if worsening  -esp if inc wheeze

## 2016-04-07 NOTE — Telephone Encounter (Signed)
Pt notified of Dr. Marliss Coots comments and verbalized understanding appt scheduled with Dr. Glori Bickers today

## 2016-04-07 NOTE — Progress Notes (Signed)
Pre visit review using our clinic review tool, if applicable. No additional management support is needed unless otherwise documented below in the visit note. 

## 2016-04-07 NOTE — Patient Instructions (Addendum)
I think you have an upper respiratory infection with bronchitis Drink fluids Rest  Watch for fever  Take the zpak as directed mucinex can help congestion otc  Try the tussionex for cough with caution  The inhaler for wheezing as needed  If wheezing worsens let us know   Update if not starting to improve in a week or if worsening       Metered Dose Inhaler With Spacer Inhaled medicines are the basis of treatment of asthma and other breathing problems. Inhaled medicine can only be effective if used properly. Good technique assures that the medicine reaches the lungs. Your health care provider has asked you to use a spacer with your inhaler to help you take the medicine more effectively. A spacer is a plastic tube with a mouthpiece on one end and an opening that connects to the inhaler on the other end. Metered dose inhalers (MDIs) are used to deliver a variety of inhaled medicines. These include quick relief or rescue medicines (such as bronchodilators) and controller medicines (such as corticosteroids). The medicine is delivered by pushing down on a metal canister to release a set amount of spray. If you are using different kinds of inhalers, use your quick relief medicine to open the airways 10-15 minutes before using a steroid if instructed to do so by your health care provider. If you are unsure which inhalers to use and the order of using them, ask your health care provider, nurse, or respiratory therapist. HOW TO USE THE INHALER WITH A SPACER 1. Remove cap from inhaler. 2. If you are using the inhaler for the first time, you will need to prime it. Shake the inhaler for 5 seconds and release four puffs into the air, away from your face. Ask your health care provider or pharmacist if you have questions about priming your inhaler. 3. Shake inhaler for 5 seconds before each breath in (inhalation). 4. Place the open end of the spacer onto the mouthpiece of the inhaler. 5. Position the inhaler  so that the top of the canister faces up and the spacer mouthpiece faces you. 6. Put your index finger on the top of the medicine canister. Your thumb supports the bottom of the inhaler and the spacer. 7. Breathe out (exhale) normally and as completely as possible. 8. Immediately after exhaling, place the spacer between your teeth and into your mouth. Close your mouth tightly around the spacer. 9. Press the canister down with the index finger to release the medicine. 10. At the same time as the canister is pressed, inhale deeply and slowly until the lungs are completely filled. This should take 4-6 seconds. Keep your tongue down and out of the way. 11. Hold the medicine in your lungs for 5-10 seconds (10 seconds is best). This helps the medicine get into the small airways of your lungs. Exhale. 12. Repeat inhaling deeply through the spacer mouthpiece. Again hold that breath for up to 10 seconds (10 seconds is best). Exhale slowly. If it is difficult to take this second deep breath through the spacer, breathe normally several times through the spacer. Remove the spacer from your mouth. 13. Wait at least 15-30 seconds between puffs. Continue with the above steps until you have taken the number of puffs your health care provider has ordered. Do not use the inhaler more than your health care provider directs you to. 14. Remove spacer from the inhaler and place cap on inhaler. 15. Follow the directions from your health care provider  or the inhaler insert for cleaning the inhaler and spacer. If you are using a steroid inhaler, rinse your mouth with water after your last puff, gargle, and spit out the water. Do not swallow the water. AVOID:   Inhaling before or after starting the spray of medicine. It takes practice to coordinate your breathing with triggering the spray.  Inhaling through the nose (rather than the mouth) when triggering the spray. HOW TO DETERMINE IF YOUR INHALER IS FULL OR NEARLY  EMPTY You cannot know when an inhaler is empty by shaking it. A few inhalers are now being made with dose counters. Ask your health care provider for a prescription that has a dose counter if you feel you need that extra help. If your inhaler does not have a counter, ask your health care provider to help you determine the date you need to refill your inhaler. Write the refill date on a calendar or your inhaler canister. Refill your inhaler 7-10 days before it runs out. Be sure to keep an adequate supply of medicine. This includes making sure it is not expired, and you have a spare inhaler.  SEEK MEDICAL CARE IF:   Symptoms are only partially relieved with your inhaler.  You are having trouble using your inhaler.  You experience some increase in phlegm. SEEK IMMEDIATE MEDICAL CARE IF:   You feel little or no relief with your inhalers. You are still wheezing and are feeling shortness of breath or tightness in your chest or both.  You have dizziness, headaches, or fast heart rate.  You have chills, fever, or night sweats.  There is a noticeable increase in phlegm production, or there is blood in the phlegm. This information is not intended to replace advice given to you by your health care provider. Make sure you discuss any questions you have with your health care provider. Document Released: 02/02/2005 Document Revised: 06/19/2014 Document Reviewed: 07/21/2012 Elsevier Interactive Patient Education  2017 [employer].

## 2016-04-07 NOTE — Assessment & Plan Note (Signed)
From viral uri in pt with bone marrow suppression  Re assuring exam-wheeze only on forced expiration  rec guaifenesin /fluids/rest tussionex with caution (has used before) zpak as directed  Albuterol mdi with inst for use (prn only)  Update if not starting to improve in a week or if worsening  -esp if inc wheeze

## 2016-04-10 ENCOUNTER — Encounter: Payer: Self-pay | Admitting: Emergency Medicine

## 2016-04-10 ENCOUNTER — Telehealth: Payer: Self-pay | Admitting: *Deleted

## 2016-04-10 ENCOUNTER — Emergency Department: Payer: Medicare Other

## 2016-04-10 ENCOUNTER — Emergency Department
Admission: EM | Admit: 2016-04-10 | Discharge: 2016-04-10 | Disposition: A | Discharge status: Home / Self Care | Payer: Medicare Other | Attending: Emergency Medicine | Admitting: Emergency Medicine

## 2016-04-10 DIAGNOSIS — R05 Cough: Secondary | ICD-10-CM | POA: Diagnosis not present

## 2016-04-10 DIAGNOSIS — J40 Bronchitis, not specified as acute or chronic: Secondary | ICD-10-CM | POA: Insufficient documentation

## 2016-04-10 DIAGNOSIS — Z7984 Long term (current) use of oral hypoglycemic drugs: Secondary | ICD-10-CM | POA: Diagnosis not present

## 2016-04-10 DIAGNOSIS — Z79899 Other long term (current) drug therapy: Secondary | ICD-10-CM | POA: Insufficient documentation

## 2016-04-10 DIAGNOSIS — Z87891 Personal history of nicotine dependence: Secondary | ICD-10-CM | POA: Insufficient documentation

## 2016-04-10 DIAGNOSIS — Z853 Personal history of malignant neoplasm of breast: Secondary | ICD-10-CM | POA: Insufficient documentation

## 2016-04-10 DIAGNOSIS — E119 Type 2 diabetes mellitus without complications: Secondary | ICD-10-CM | POA: Insufficient documentation

## 2016-04-10 DIAGNOSIS — Z85818 Personal history of malignant neoplasm of other sites of lip, oral cavity, and pharynx: Secondary | ICD-10-CM | POA: Insufficient documentation

## 2016-04-10 DIAGNOSIS — I10 Essential (primary) hypertension: Secondary | ICD-10-CM | POA: Diagnosis not present

## 2016-04-10 MED ORDER — ALBUTEROL SULFATE HFA 108 (90 BASE) MCG/ACT IN AERS: 2.0000 | INHALATION_SPRAY | Freq: Four times a day (QID) | RESPIRATORY_TRACT | 0 refills | Status: DC | PRN

## 2016-04-10 MED ORDER — ALBUTEROL SULFATE HFA 108 (90 BASE) MCG/ACT IN AERS
2.0000 | INHALATION_SPRAY | Freq: Once | RESPIRATORY_TRACT | Status: AC
  Administered 2016-04-10: 2 via RESPIRATORY_TRACT
  Filled 2016-04-10: qty 6.7

## 2016-04-10 MED ORDER — ALBUTEROL SULFATE (2.5 MG/3ML) 0.083% IN NEBU
3.0000 mL | INHALATION_SOLUTION | Freq: Once | RESPIRATORY_TRACT | Status: AC
  Administered 2016-04-10: 3 mL via RESPIRATORY_TRACT
  Filled 2016-04-10: qty 3

## 2016-04-10 NOTE — ED Notes (Signed)
Pt verbalized understanding of discharge instructions. NAD at this time. 

## 2016-04-10 NOTE — Telephone Encounter (Signed)
PA for proventil inhaler filled out and faxed back to ins. I will await a response

## 2016-04-10 NOTE — ED Provider Notes (Signed)
[facility] Emergency Department Provider Note  ____________________________________________  Time seen: Approximately 5:54 PM  I have reviewed the triage vital signs and the nursing notes.   HISTORY  Chief Complaint Cough    HPI Suzanne Strong is a 71 y.o. female that presents to the emergency department with nonproductive cough for 6 days. Patient states that she went to her PCP on Monday and was told that she has viral acute bronchitis and was given azithromycin and Tussinex.Patient states the cough is not better or worse than it was on Monday. Patient had a low-grade fever yesterday, which went away with Tylenol. Patient did not receive flu shot this year because she finished chemotherapy in November for breast cancer and was recommended not to receive it. Patient has type 2 diabetes. Patient smoked a half pack a day for 6 years. Patient denies chills, muscle aches, nasal congestion, sore throat, shortness of breath, chest pain, nausea, vomiting, abdominal pain.   Past Medical History:  Diagnosis Date  . Anemia    iron deficiency and B12 deficiency  . Anxiety   . Breast cancer ([place]) 02/25/2015   upper-outer quadrant of left female breast, Triple Negative. Neo-adjuvant chemo with complete pathologic response.   . Cervical dysplasia    conization  . Diabetes mellitus without complication ([place])   . Diverticulosis   . Fever 04/11/2015  . Gastroparesis    related to previous radiation tx  . GERD (gastroesophageal reflux disease)   . Hyperlipidemia   . Hypertension   . Shingles    Hx of  . 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 Acute bronchitis 04/07/2016  . Obstructive sleep apnea 01/01/2016  . Carcinoma of upper-outer quadrant of left breast in female, estrogen receptor negative ([place]) 11/21/2015  . Hypomagnesemia 08/28/2015  . Fever 04/11/2015  . Diabetes mellitus without complication ([place])  [id number]  . Initial Medicare annual wellness visit 10/16/2014  . Estrogen deficiency 10/16/2014  . Colon cancer screening 10/16/2014  . Controlled type 2 diabetes mellitus without complication ([place]) [id number]  . RLS (restless legs syndrome) 02/21/2014  . Chronic neck pain 07/25/2010  . Depression 07/07/2010  . Routine general medical examination at a health care facility 06/26/2010  . B12 deficiency 09/04/2009  . Anemia, iron deficiency 08/29/2009  . ANXIETY 08/26/2009  . GASTROPARESIS 08/26/2009  . Vitamin D deficiency 07/19/2008  . Disorder of bone and cartilage 07/19/2007  . Hyperlipidemia 07/18/2007  . EDEMA 07/18/2007    Past Surgical History:  Procedure Laterality Date  . APPENDECTOMY    . BREAST BIOPSY Left 02/25/2015   INVASIVE MAMMARY CARCINOMA,Triple negative.  [name] BREAST CYST ASPIRATION    . BREAST EXCISIONAL BIOPSY Left 12/2015   surgery  . BREAST LUMPECTOMY WITH NEEDLE LOCALIZATION Left 10/02/2015   Procedure: BREAST LUMPECTOMY WITH NEEDLE LOCALIZATION;  Surgeon: [name], MD;  Location: [facility] ORS;  Service: General;  Laterality: Left;  . BREAST LUMPECTOMY WITH SENTINEL LYMPH NODE BIOPSY Left 10/02/2015   Procedure: LEFT BREAST WIDE EXCISION WITH SENTINEL LYMPH NODE BX;  Surgeon: [name], MD;  Location: [facility] ORS;  Service: General;  Laterality: Left;  . BREAST SURGERY     breast biopsy benign  . CHOLECYSTECTOMY    . ESOPHAGOGASTRODUODENOSCOPY  07/2009   normal with few gastric polyps  . MOLE REMOVAL    . PORT-A-CATH REMOVAL    . PORTACATH PLACEMENT    . PORTACATH PLACEMENT Right 03/12/2015  Procedure: INSERTION PORT-A-CATH;  Surgeon: [name], MD;  Location: [facility] ORS;  Service: General;  Laterality: Right;  . RADICAL NECK DISSECTION    . TONSILLECTOMY     cancer treated with chemo and radiation    Prior to Admission medications   Medication Sig Start Date End Date Taking? Authorizing Provider  albuterol (PROVENTIL HFA;VENTOLIN  HFA) 108 (90 Base) MCG/ACT inhaler Inhale 2 puffs into the lungs every 6 (six) hours as needed for wheezing or shortness of breath. 04/10/16   Laban Emperor, PA-C  ALPRAZolam Duanne Moron) 0.5 MG tablet Take 1 tablet (0.5 mg total) by mouth at bedtime as needed for sleep. 09/02/11   [name], MD  amitriptyline (ELAVIL) 50 MG tablet Take 1 tablet (50 mg total) by mouth at bedtime. 04/06/16   Cammie Sickle, MD  atorvastatin (LIPITOR) 10 MG tablet take 1 tablet by mouth once daily 12/16/15   [name], MD  azithromycin (ZITHROMAX Z-PAK) 250 MG tablet Take 2 pills by mouth today and then 1 pill daily for 4 days 04/07/16   [name], MD  b complex vitamins tablet Take 1 tablet by mouth daily.      Historical Provider, MD  chlorpheniramine-HYDROcodone (TUSSIONEX PENNKINETIC ER) 10-8 MG/5ML SUER Take 5 mLs by mouth every 12 (twelve) hours as needed for cough. 04/07/16   [name], MD  cyanocobalamin (,VITAMIN B-12,) 1000 MCG/ML injection Inject 1,000 mcg into the muscle every 30 (thirty) days.      Historical Provider, MD  fluticasone (FLONASE) 50 MCG/ACT nasal spray instill 2 sprays into each nostril once daily 02/13/16   [name], MD  gabapentin (NEURONTIN) 300 MG capsule take 1 tablet by mouth every morning and 2 tablet every evening 02/13/16   Cammie Sickle, MD  glipiZIDE (GLUCOTROL) 10 MG tablet Take 10 mg by mouth daily before breakfast.  03/13/15   Historical Provider, MD  JANUMET XR 50-1000 MG TB24 Take 1 tablet by mouth 2 (two) times daily.  03/13/15   Historical Provider, MD  lidocaine-prilocaine (EMLA) cream Apply 1 application topically as needed. Apply to port when going through [place] Provider, MD  lisinopril (PRINIVIL,ZESTRIL) 5 MG tablet take 1 tablet by mouth once daily 12/16/15   [name], MD  mirtazapine (REMERON) 30 MG tablet take 2 tablet by mouth at bedtime 07/14/15   Historical Provider, MD  omeprazole (PRILOSEC) 20 MG capsule take 1 capsule by  mouth once daily 12/23/15   [name], MD  potassium chloride (K-DUR) 10 MEQ tablet take 1 tablet by mouth once daily 01/24/16   Cammie Sickle, MD  rOPINIRole (REQUIP) 1 MG tablet take 1 tablet by mouth every morning if needed and 3 tablets by mouth at bedtime 12/16/15   [name], MD  SLOW-MAG 71.5-119 MG TBEC SR tablet take 1 tablet by mouth daily 10/29/15   Cammie Sickle, MD    Allergies Amoxicillin and Fluconazole  Family History  Problem Relation Age of Onset  . Osteoporosis Mother   . Hyperlipidemia Mother   . Depression Mother   . Cancer Mother     skin cancer ? basal cell and lung ca heavy smoker  . Alcohol abuse Father   . Cancer Father     skin CA ? basal cell  . Heart disease Father     CHF  . Hyperlipidemia Brother   . Hypertension Brother     Social History Social History  Substance Use Topics  . Smoking status: Former Smoker    Packs/day: 0.50    Years: 6.00    Types: Cigarettes    Quit date: 03/10/1984  . Smokeless tobacco: Never Used  . Alcohol use No     Review of Systems  Constitutional: No fever/chills Eyes: No visual changes. No discharge. ENT: Positive for congestion and rhinorrhea. Cardiovascular: No chest pain. Respiratory: Positive for cough. No SOB. Gastrointestinal: No abdominal pain.  No nausea, no vomiting.  No diarrhea.  No constipation. Musculoskeletal: Negative for musculoskeletal pain. Skin: Negative for rash, abrasions, lacerations, ecchymosis. Neurological: Negative for headaches.   ____________________________________________   PHYSICAL EXAM:  VITAL SIGNS: ED Triage Vitals  Enc Vitals Group     BP 04/10/16 1650 (!) 137/55     Pulse Rate 04/10/16 1650 99     Resp 04/10/16 1650 20     Temp 04/10/16 1650 98.4 F (36.9 C)     Temp Source 04/10/16 1650 Oral     SpO2 04/10/16 1650 95 %     Weight 04/10/16 1658 219 lb (99.3 kg)     Height 04/10/16 1658 5\' 6"  (1.676 m)     Head Circumference --      Peak  Flow --      Pain Score 04/10/16 1658 0     Pain Loc --      Pain Edu? --      Excl. in [place]? --      Constitutional: Alert and oriented. Well appearing and in no acute distress. Eyes: Conjunctivae are normal. PERRL. EOMI. No discharge. Head: Atraumatic. ENT: No frontal and maxillary sinus tenderness.      Ears:       Nose: No congestion/rhinnorhea.      Mouth/Throat: Mucous membranes are moist. Oropharynx non-erythematous. Tonsils not enlarged. No exudates. Uvula midline. Neck: No stridor.   Hematological/Lymphatic/Immunilogical: No cervical lymphadenopathy. Cardiovascular: Normal rate, regular rhythm.  Good peripheral circulation. Respiratory: Normal respiratory effort without tachypnea or retractions. Lungs CTAB. Good air entry to the bases with no decreased or absent breath sounds. Gastrointestinal: Bowel sounds 4 quadrants. Soft and nontender to palpation. No guarding or rigidity. No palpable masses. No distention. Musculoskeletal: Full range of motion to all extremities. No gross deformities appreciated. Neurologic:  Normal speech and language. No gross focal neurologic deficits are appreciated.  Skin:  Skin is warm, dry and intact. No rash noted. Psychiatric: Mood and affect are normal. Speech and behavior are normal. Patient exhibits appropriate insight and judgement.   ____________________________________________   LABS (all labs ordered are listed, but only abnormal results are displayed)  Labs Reviewed - No data to display ____________________________________________  EKG   ____________________________________________  RADIOLOGY Robinette Haines, personally viewed and evaluated these images (plain radiographs) as part of my medical decision making, as well as reviewing the written report by the radiologist.  Dg Chest 2 View  Result Date: 04/10/2016 CLINICAL DATA:  Nonproductive cough.  Former smoker. EXAM: CHEST  2 VIEW COMPARISON:  03/12/2015 FINDINGS: Right  subclavian Port-A-Cath remains in place with tip overlying the lower SVC. The cardiomediastinal silhouette is within normal limits. Surgical clips are noted in the lower neck and superior mediastinum. There is peribronchial thickening which is slightly more prominent than on the prior study. No confluent airspace opacity, overt pulmonary edema, pleural effusion, or pneumothorax is identified. No acute osseous abnormality is seen. IMPRESSION: Bronchitic changes.  No evidence of pneumonia. Electronically Signed   By: Seymour Bars.D.  On: 04/10/2016 17:32    ____________________________________________    PROCEDURES  Procedure(s) performed:    Procedures    Medications  albuterol (PROVENTIL HFA;VENTOLIN HFA) 108 (90 Base) MCG/ACT inhaler 2 puff (not administered)  albuterol (PROVENTIL) (2.5 MG/3ML) 0.083% nebulizer solution 3 mL (3 mLs Inhalation Given 04/10/16 1759)     ____________________________________________   INITIAL IMPRESSION / ASSESSMENT AND PLAN / ED COURSE  Pertinent labs & imaging results that were available during my care of the patient were reviewed by me and considered in my medical decision making (see chart for details).  Review of the [address] CSRS was performed in accordance of the [place] prior to dispensing any controlled drugs.     Patient's diagnosis is consistent with viral bronchitis. Vital signs and exam are reassuring. Chest x-ray negative for acute cardiopulmonary processes. Patient appears well and is staying well hydrated.  Patient feels comfortable going home. Patient is to finish azithromycin prescription. Because of patient's diabetes, I do not want to start prednisone at this time. Patient was given DuoNeb in ED. Patient will be discharged home with prescriptions for albuterol inhaler. Patient is to follow up with PCP as needed or otherwise directed. Patient is given ED precautions to return to the ED for any worsening or new  symptoms.     ____________________________________________  FINAL CLINICAL IMPRESSION(S) / ED DIAGNOSES  Final diagnoses:  Bronchitis      NEW MEDICATIONS STARTED DURING THIS VISIT:  New Prescriptions   ALBUTEROL (PROVENTIL HFA;VENTOLIN HFA) 108 (90 BASE) MCG/ACT INHALER    Inhale 2 puffs into the lungs every 6 (six) hours as needed for wheezing or shortness of breath.        This chart was dictated using voice recognition software/Dragon. Despite best efforts to proofread, errors can occur which can change the meaning. Any change was purely unintentional.    Laban Emperor, PA-C 04/10/16 1832    Rudene Re, MD 04/13/16 1012

## 2016-04-10 NOTE — ED Triage Notes (Signed)
Pt reports non-productive cough that started last Saturday. Pt states she saw her PCP on Monday and was started on a Z-pak and tussionex. Pt states the cough is not worse than Monday, but states she feels like she is just coughing more frequently. Pt states she had temp of 100 yesterday and took Tylenol which helped.

## 2016-04-17 ENCOUNTER — Other Ambulatory Visit: Payer: Self-pay

## 2016-04-17 MED ORDER — HYDROCOD POLST-CPM POLST ER 10-8 MG/5ML PO SUER: 5.0000 mL | Freq: Two times a day (BID) | ORAL | 0 refills | Status: DC | PRN

## 2016-04-17 NOTE — Telephone Encounter (Signed)
Px printed for pick up in IN box F/u if no further improvement

## 2016-04-17 NOTE — Telephone Encounter (Signed)
Pt notified Rx ready for pick up and advise to f/u if no improvement

## 2016-04-17 NOTE — Telephone Encounter (Signed)
Pt left v/m requesting refill tussionex(last refilled # 120 ml on 04/07/16). Pt was seen 04/07/16; pt still coughing a lot and pt sounds very hoarse. Pt seen [facility] ED on 04/10/16 and had CXR that listed bronchitic changes but no evidence of pneumonia. Pt request cb.

## 2016-04-22 ENCOUNTER — Other Ambulatory Visit: Payer: Self-pay | Admitting: Internal Medicine

## 2016-04-22 DIAGNOSIS — C50412 Malignant neoplasm of upper-outer quadrant of left female breast: Secondary | ICD-10-CM

## 2016-04-23 ENCOUNTER — Other Ambulatory Visit: Payer: Self-pay | Admitting: Internal Medicine

## 2016-04-23 DIAGNOSIS — C50412 Malignant neoplasm of upper-outer quadrant of left female breast: Secondary | ICD-10-CM

## 2016-05-25 DIAGNOSIS — E113393 Type 2 diabetes mellitus with moderate nonproliferative diabetic retinopathy without macular edema, bilateral: Secondary | ICD-10-CM | POA: Diagnosis not present

## 2016-05-28 ENCOUNTER — Other Ambulatory Visit: Payer: Self-pay | Admitting: Internal Medicine

## 2016-05-28 DIAGNOSIS — C50412 Malignant neoplasm of upper-outer quadrant of left female breast: Secondary | ICD-10-CM

## 2016-06-01 ENCOUNTER — Telehealth: Payer: Self-pay | Admitting: *Deleted

## 2016-06-01 ENCOUNTER — Inpatient Hospital Stay: Payer: Medicare Other

## 2016-06-01 ENCOUNTER — Inpatient Hospital Stay: Payer: Medicare Other | Attending: Internal Medicine

## 2016-06-01 DIAGNOSIS — C50412 Malignant neoplasm of upper-outer quadrant of left female breast: Secondary | ICD-10-CM

## 2016-06-01 DIAGNOSIS — Z95828 Presence of other vascular implants and grafts: Secondary | ICD-10-CM

## 2016-06-01 DIAGNOSIS — Z171 Estrogen receptor negative status [ER-]: Secondary | ICD-10-CM | POA: Insufficient documentation

## 2016-06-01 DIAGNOSIS — Z853 Personal history of malignant neoplasm of breast: Secondary | ICD-10-CM | POA: Diagnosis not present

## 2016-06-01 LAB — BASIC METABOLIC PANEL
Anion gap: 7 (ref 5–15)
BUN: 12 mg/dL (ref 6–20)
CALCIUM: 8.6 mg/dL — AB (ref 8.9–10.3)
CO2: 24 mmol/L (ref 22–32)
CREATININE: 0.55 mg/dL (ref 0.44–1.00)
Chloride: 103 mmol/L (ref 101–111)
Glucose, Bld: 124 mg/dL — ABNORMAL HIGH (ref 65–99)
Potassium: 3.9 mmol/L (ref 3.5–5.1)
Sodium: 134 mmol/L — ABNORMAL LOW (ref 135–145)

## 2016-06-01 LAB — CBC WITH DIFFERENTIAL/PLATELET
BASOS ABS: 0 10*3/uL (ref 0–0.1)
BASOS PCT: 1 %
EOS ABS: 0.1 10*3/uL (ref 0–0.7)
EOS PCT: 4 %
HCT: 29.4 % — ABNORMAL LOW (ref 35.0–47.0)
Hemoglobin: 9.7 g/dL — ABNORMAL LOW (ref 12.0–16.0)
Lymphocytes Relative: 17 %
Lymphs Abs: 0.6 10*3/uL — ABNORMAL LOW (ref 1.0–3.6)
MCH: 27.4 pg (ref 26.0–34.0)
MCHC: 33 g/dL (ref 32.0–36.0)
MCV: 82.8 fL (ref 80.0–100.0)
MONO ABS: 0.3 10*3/uL (ref 0.2–0.9)
Monocytes Relative: 9 %
Neutro Abs: 2.4 10*3/uL (ref 1.4–6.5)
Neutrophils Relative %: 69 %
PLATELETS: 203 10*3/uL (ref 150–440)
RBC: 3.55 MIL/uL — ABNORMAL LOW (ref 3.80–5.20)
RDW: 17.2 % — AB (ref 11.5–14.5)
WBC: 3.5 10*3/uL — AB (ref 3.6–11.0)

## 2016-06-01 LAB — MAGNESIUM: MAGNESIUM: 1.5 mg/dL — AB (ref 1.7–2.4)

## 2016-06-01 MED ORDER — HYDROCODONE-ACETAMINOPHEN 5-325 MG PO TABS: 1.0000 | ORAL_TABLET | Freq: Every evening | ORAL | 0 refills | Status: DC | PRN

## 2016-06-01 MED ORDER — HEPARIN SOD (PORK) LOCK FLUSH 100 UNIT/ML IV SOLN
500.0000 [IU] | Freq: Once | INTRAVENOUS | Status: AC
  Administered 2016-06-01: 500 [IU] via INTRAVENOUS

## 2016-06-01 MED ORDER — SODIUM CHLORIDE 0.9% FLUSH
10.0000 mL | INTRAVENOUS | Status: DC | PRN
  Administered 2016-06-01: 10 mL via INTRAVENOUS
  Filled 2016-06-01: qty 10

## 2016-06-01 NOTE — Telephone Encounter (Signed)
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 States the pain has gotten worse and she is unble to rest at night from it. Please advise.

## 2016-06-01 NOTE — Telephone Encounter (Signed)
Per B, OK to fill Norco

## 2016-06-04 ENCOUNTER — Other Ambulatory Visit: Payer: Self-pay | Admitting: Family Medicine

## 2016-06-04 NOTE — Telephone Encounter (Signed)
done

## 2016-06-04 NOTE — Telephone Encounter (Signed)
Please refill times 2 

## 2016-06-04 NOTE — Telephone Encounter (Signed)
CPE scheduled for 07/01/16, last filled on 12/16/15 #120 tabs with 5 additional refills, please advise

## 2016-06-05 ENCOUNTER — Other Ambulatory Visit: Payer: Self-pay | Admitting: Family Medicine

## 2016-06-05 DIAGNOSIS — F411 Generalized anxiety disorder: Secondary | ICD-10-CM | POA: Diagnosis not present

## 2016-06-05 DIAGNOSIS — E119 Type 2 diabetes mellitus without complications: Secondary | ICD-10-CM | POA: Diagnosis not present

## 2016-06-12 DIAGNOSIS — G620 Drug-induced polyneuropathy: Secondary | ICD-10-CM | POA: Diagnosis not present

## 2016-06-12 DIAGNOSIS — E119 Type 2 diabetes mellitus without complications: Secondary | ICD-10-CM | POA: Diagnosis not present

## 2016-06-23 ENCOUNTER — Telehealth: Payer: Self-pay | Admitting: Family Medicine

## 2016-06-23 DIAGNOSIS — E7800 Pure hypercholesterolemia, unspecified: Secondary | ICD-10-CM

## 2016-06-23 DIAGNOSIS — E559 Vitamin D deficiency, unspecified: Secondary | ICD-10-CM

## 2016-06-23 DIAGNOSIS — K3184 Gastroparesis: Secondary | ICD-10-CM

## 2016-06-23 DIAGNOSIS — D509 Iron deficiency anemia, unspecified: Secondary | ICD-10-CM

## 2016-06-23 DIAGNOSIS — E538 Deficiency of other specified B group vitamins: Secondary | ICD-10-CM

## 2016-06-23 DIAGNOSIS — F411 Generalized anxiety disorder: Secondary | ICD-10-CM

## 2016-06-23 NOTE — Telephone Encounter (Signed)
-----   Message from Eustace Pen, LPN sent at 0/10/9831  3:42 PM EDT ----- Regarding: Labs 5/10 Please add Hep C to lab orders.   Traditional Medicare

## 2016-06-25 ENCOUNTER — Ambulatory Visit (INDEPENDENT_AMBULATORY_CARE_PROVIDER_SITE_OTHER): Payer: Medicare Other

## 2016-06-25 VITALS — BP 110/52 | HR 91 | Temp 98.2°F | Ht 65.5 in | Wt 225.5 lb

## 2016-06-25 DIAGNOSIS — K3184 Gastroparesis: Secondary | ICD-10-CM

## 2016-06-25 DIAGNOSIS — D509 Iron deficiency anemia, unspecified: Secondary | ICD-10-CM | POA: Diagnosis not present

## 2016-06-25 DIAGNOSIS — E538 Deficiency of other specified B group vitamins: Secondary | ICD-10-CM

## 2016-06-25 DIAGNOSIS — Z1159 Encounter for screening for other viral diseases: Secondary | ICD-10-CM | POA: Diagnosis not present

## 2016-06-25 DIAGNOSIS — Z0000 Encounter for general adult medical examination without abnormal findings: Secondary | ICD-10-CM | POA: Diagnosis not present

## 2016-06-25 DIAGNOSIS — F411 Generalized anxiety disorder: Secondary | ICD-10-CM | POA: Diagnosis not present

## 2016-06-25 DIAGNOSIS — E559 Vitamin D deficiency, unspecified: Secondary | ICD-10-CM

## 2016-06-25 DIAGNOSIS — E7800 Pure hypercholesterolemia, unspecified: Secondary | ICD-10-CM | POA: Diagnosis not present

## 2016-06-25 LAB — COMPREHENSIVE METABOLIC PANEL
ALK PHOS: 82 U/L (ref 39–117)
ALT: 19 U/L (ref 0–35)
AST: 20 U/L (ref 0–37)
Albumin: 3.7 g/dL (ref 3.5–5.2)
BILIRUBIN TOTAL: 0.2 mg/dL (ref 0.2–1.2)
BUN: 17 mg/dL (ref 6–23)
CO2: 30 meq/L (ref 19–32)
Calcium: 9.2 mg/dL (ref 8.4–10.5)
Chloride: 104 mEq/L (ref 96–112)
Creatinine, Ser: 0.78 mg/dL (ref 0.40–1.20)
GFR: 77.93 mL/min (ref >60.00)
GLUCOSE: 131 mg/dL — AB (ref 70–99)
POTASSIUM: 4.8 meq/L (ref 3.5–5.1)
Sodium: 139 mEq/L (ref 135–145)
Total Protein: 7 g/dL (ref 6.0–8.3)

## 2016-06-25 LAB — CBC WITH DIFFERENTIAL/PLATELET
BASOS PCT: 0.8 % (ref 0.0–3.0)
Basophils Absolute: 0 10*3/uL (ref 0.0–0.1)
EOS ABS: 0.1 10*3/uL (ref 0.0–0.7)
Eosinophils Relative: 3 % (ref 0.0–5.0)
HCT: 31 % — ABNORMAL LOW (ref 36.0–46.0)
Hemoglobin: 10 g/dL — ABNORMAL LOW (ref 12.0–15.0)
LYMPHS ABS: 0.6 10*3/uL — AB (ref 0.7–4.0)
Lymphocytes Relative: 12.2 % (ref 12.0–46.0)
MCHC: 32.3 g/dL (ref 30.0–36.0)
MCV: 85.1 fl (ref 78.0–100.0)
MONO ABS: 0.3 10*3/uL (ref 0.1–1.0)
Monocytes Relative: 7.1 % (ref 3.0–12.0)
Neutro Abs: 3.8 10*3/uL (ref 1.4–7.7)
Neutrophils Relative %: 76.9 % (ref 43.0–77.0)
Platelets: 224 10*3/uL (ref 150.0–400.0)
RBC: 3.64 Mil/uL — ABNORMAL LOW (ref 3.87–5.11)
RDW: 17.4 % — AB (ref 11.5–15.5)
WBC: 4.9 10*3/uL (ref 4.0–10.5)

## 2016-06-25 LAB — LIPID PANEL
CHOL/HDL RATIO: 3
Cholesterol: 156 mg/dL (ref 0–200)
HDL: 49.6 mg/dL (ref >39.00)
LDL Cholesterol: 70 mg/dL (ref 0–99)
NonHDL: 106.16
TRIGLYCERIDES: 183 mg/dL — AB (ref 0.0–149.0)
VLDL: 36.6 mg/dL (ref 0.0–40.0)

## 2016-06-25 NOTE — Patient Instructions (Addendum)
Suzanne Strong , Thank you for taking time to come for your Medicare Wellness Visit. I appreciate your ongoing commitment to your health goals. Please review the following plan we discussed and let me know if I can assist you in the future.   These are the goals we discussed: Goals    . Increase physical activity          Starting 06/25/2016, I will continue to walk at least 30-45 min 3-4 days per week.        This is a list of the screening recommended for you and due dates:  Health Maintenance  Topic Date Due  . Flu Shot  01/12/2021*  . Hemoglobin A1C  12/26/2016  . Complete foot exam   05/21/2017  . Eye exam for diabetics  06/08/2017  . Pneumonia vaccines (2 of 2 - PPSV23) 08/31/2017  . Mammogram  04/06/2018  . Colon Cancer Screening  10/30/2019  . Tetanus Vaccine  06/02/2022  . DEXA scan (bone density measurement)  Completed  .  Hepatitis C: One time screening is recommended by Center for Disease Control  (CDC) for  adults born from 18 through 1963.   Completed  *Topic was postponed. The date shown is not the original due date.     Preventive Care for Adults  A healthy lifestyle and preventive care can promote health and wellness. Preventive health guidelines for adults include the following key practices.  . A routine yearly physical is a good way to check with your health care provider about your health and preventive screening. It is a chance to share any concerns and updates on your health and to receive a thorough exam.  . Visit your dentist for a routine exam and preventive care every 6 months. Brush your teeth twice a day and floss once a day. Good oral hygiene prevents tooth decay and gum disease.  . The frequency of eye exams is based on your age, health, family medical history, use  of contact lenses, and other factors. Follow your health care provider's ecommendations for frequency of eye exams.  . Eat a healthy diet. Foods like vegetables, fruits, whole grains,  low-fat dairy products, and lean protein foods contain the nutrients you need without too many calories. Decrease your intake of foods high in solid fats, added sugars, and salt. Eat the right amount of calories for you. Get information about a proper diet from your health care provider, if necessary.  . Regular physical exercise is one of the most important things you can do for your health. Most adults should get at least 150 minutes of moderate-intensity exercise (any activity that increases your heart rate and causes you to sweat) each week. In addition, most adults need muscle-strengthening exercises on 2 or more days a week.  Silver Sneakers may be a benefit available to you. To determine eligibility, you may visit the website: www.silversneakers.com or contact program at (309)199-3811 Mon-Fri between 8AM-8PM.   . Maintain a healthy weight. The body mass index (BMI) is a screening tool to identify possible weight problems. It provides an estimate of body fat based on height and weight. Your health care provider can find your BMI and can help you achieve or maintain a healthy weight.   For adults 20 years and older: ? A BMI below 18.5 is considered underweight. ? A BMI of 18.5 to 24.9 is normal. ? A BMI of 25 to 29.9 is considered overweight. ? A BMI of 30 and above is considered  obese.   . Maintain normal blood lipids and cholesterol levels by exercising and minimizing your intake of saturated fat. Eat a balanced diet with plenty of fruit and vegetables. Blood tests for lipids and cholesterol should begin at age 69 and be repeated every 5 years. If your lipid or cholesterol levels are high, you are over 50, or you are at high risk for heart disease, you may need your cholesterol levels checked more frequently. Ongoing high lipid and cholesterol levels should be treated with medicines if diet and exercise are not working.  . If you smoke, find out from your health care provider how to quit. If  you do not use tobacco, please do not start.  . If you choose to drink alcohol, please do not consume more than 2 drinks per day. One drink is considered to be 12 ounces (355 mL) of beer, 5 ounces (148 mL) of wine, or 1.5 ounces (44 mL) of liquor.  . If you are 71-60 years old, ask your health care provider if you should take aspirin to prevent strokes.  . Use sunscreen. Apply sunscreen liberally and repeatedly throughout the day. You should seek shade when your shadow is shorter than you. Protect yourself by wearing long sleeves, pants, a wide-brimmed hat, and sunglasses year round, whenever you are outdoors.  . Once a month, do a whole body skin exam, using a mirror to look at the skin on your back. Tell your health care provider of new moles, moles that have irregular borders, moles that are larger than a pencil eraser, or moles that have changed in shape or color.

## 2016-06-25 NOTE — Progress Notes (Signed)
Pre visit review using our clinic review tool, if applicable. No additional management support is needed unless otherwise documented below in the visit note. 

## 2016-06-25 NOTE — Progress Notes (Signed)
Subjective:   Suzanne Strong is a 71 y.o. female who presents for Medicare Annual (Subsequent) preventive examination.  Review of Systems:  N/A Cardiac Risk Factors include: advanced age (>29men, >28 women);obesity (BMI >30kg/m2);dyslipidemia;diabetes mellitus     Objective:     Vitals: BP (!) 110/52 (BP Location: Right Arm, Patient Position: Sitting, Cuff Size: Normal)   Pulse 91   Temp 98.2 F (36.8 C) (Oral)   Ht 5' 5.5" (1.664 m) Comment: flat shoes  Wt 225 lb 8 oz (102.3 kg)   SpO2 93%   BMI 36.95 kg/m   Body mass index is 36.95 kg/m.   Tobacco History  Smoking Status  . Former Smoker  . Packs/day: 0.50  . Years: 6.00  . Types: Cigarettes  . Quit date: 03/10/1984  Smokeless Tobacco  . Never Used     Counseling given: No   Past Medical History:  Diagnosis Date  . Anemia    iron deficiency and B12 deficiency  . Anxiety   . Breast cancer ([place]) 02/25/2015   upper-outer quadrant of left female breast, Triple Negative. Neo-adjuvant chemo with complete pathologic response.   . Cervical dysplasia    conization  . Diabetes mellitus without complication ([place])   . Diverticulosis   . Fever 04/11/2015  . Gastroparesis    related to previous radiation tx  . GERD (gastroesophageal reflux disease)   . Hyperlipidemia   . Hypertension   . Shingles    Hx of  . 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 APPENDECTOMY    . BREAST BIOPSY Left 02/25/2015   INVASIVE MAMMARY CARCINOMA,Triple negative.  [name] BREAST CYST ASPIRATION    . BREAST EXCISIONAL BIOPSY Left 12/2015   surgery  . BREAST LUMPECTOMY WITH NEEDLE LOCALIZATION Left 10/02/2015   Procedure: BREAST LUMPECTOMY WITH NEEDLE LOCALIZATION;  Surgeon: [name], MD;  Location: [facility] ORS;  Service: General;  Laterality: Left;  . BREAST LUMPECTOMY WITH SENTINEL LYMPH NODE BIOPSY Left 10/02/2015   Procedure: LEFT BREAST WIDE EXCISION WITH SENTINEL LYMPH NODE BX;   Surgeon: [name], MD;  Location: [facility] ORS;  Service: General;  Laterality: Left;  . BREAST SURGERY     breast biopsy benign  . CHOLECYSTECTOMY    . ESOPHAGOGASTRODUODENOSCOPY  07/2009   normal with few gastric polyps  . MOLE REMOVAL    . PORT-A-CATH REMOVAL    . PORTACATH PLACEMENT    . PORTACATH PLACEMENT Right 03/12/2015   Procedure: INSERTION PORT-A-CATH;  Surgeon: [name], MD;  Location: [facility] ORS;  Service: General;  Laterality: Right;  . RADICAL NECK DISSECTION    . TONSILLECTOMY     cancer treated with chemo and radiation   Family History  Problem Relation Age of Onset  . Osteoporosis Mother   . Hyperlipidemia Mother   . Depression Mother   . Cancer Mother        skin cancer ? basal cell and lung ca heavy smoker  . Alcohol abuse Father   . Cancer Father        skin CA ? basal cell  . Heart disease Father        CHF  . Hyperlipidemia Brother   . Hypertension Brother    History  Sexual Activity  . Sexual activity: Not on file    Outpatient Encounter Prescriptions as of 06/25/2016  Medication Sig  . ALPRAZolam (XANAX) 0.5 MG tablet Take 1 tablet (0.5 mg total) by mouth  at bedtime as needed for sleep.  [name] amitriptyline (ELAVIL) 50 MG tablet Take 1 tablet (50 mg total) by mouth at bedtime.  [name] atorvastatin (LIPITOR) 10 MG tablet take 1 tablet by mouth once daily  . b complex vitamins tablet Take 1 tablet by mouth daily.    . cyanocobalamin (,VITAMIN B-12,) 1000 MCG/ML injection Inject 1,000 mcg into the muscle every 30 (thirty) days.    . fluticasone (FLONASE) 50 MCG/ACT nasal spray instill 2 sprays into each nostril once daily  . gabapentin (NEURONTIN) 300 MG capsule TAKE 1 CAPSULE BY MOUTH EVERY MORNING AND 2 EVERY EVENING AS DIRECTED  . glipiZIDE (GLUCOTROL) 10 MG tablet Take 10 mg by mouth daily before breakfast.   . HYDROcodone-acetaminophen (NORCO/VICODIN) 5-325 MG tablet Take 1 tablet by mouth at bedtime as needed for moderate pain.  [name] JANUMET XR  50-1000 MG TB24 Take 1 tablet by mouth 2 (two) times daily.   [name] lidocaine-prilocaine (EMLA) cream Apply 1 application topically as needed. Apply to port when going through Chemo  . lisinopril (PRINIVIL,ZESTRIL) 5 MG tablet take 1 tablet by mouth once daily  . mirtazapine (REMERON) 30 MG tablet take 2 tablet by mouth at bedtime  . omeprazole (PRILOSEC) 20 MG capsule take 1 capsule by mouth once daily  . potassium chloride (K-DUR) 10 MEQ tablet take 1 tablet by mouth once daily  . rOPINIRole (REQUIP) 1 MG tablet take 1 tablet by mouth every morning if needed and 3 tablets at bedtime  . SLOW-MAG 71.5-119 MG TBEC SR tablet take 1 tablet by mouth once daily  . [DISCONTINUED] albuterol (PROVENTIL HFA;VENTOLIN HFA) 108 (90 Base) MCG/ACT inhaler Inhale 2 puffs into the lungs every 6 (six) hours as needed for wheezing or shortness of breath.  . [DISCONTINUED] azithromycin (ZITHROMAX Z-PAK) 250 MG tablet Take 2 pills by mouth today and then 1 pill daily for 4 days  . [DISCONTINUED] chlorpheniramine-HYDROcodone (TUSSIONEX PENNKINETIC ER) 10-8 MG/5ML SUER Take 5 mLs by mouth every 12 (twelve) hours as needed for cough.   Facility-Administered Encounter Medications as of 06/25/2016  Medication  . sodium chloride flush (NS) 0.9 % injection 10 mL    Activities of Daily Living In your present state of health, do you have any difficulty performing the following activities: 06/25/2016 09/23/2015  Hearing? N N  Vision? N N  Difficulty concentrating or making decisions? N N  Walking or climbing stairs? N N  Dressing or bathing? N N  Doing errands, shopping? N N  Preparing Food and eating ? N -  Using the Toilet? N -  In the past six months, have you accidently leaked urine? N -  Do you have problems with loss of bowel control? N -  Managing your Medications? N -  Managing your Finances? N -  Housekeeping or managing your Housekeeping? N -  Some recent data might be hidden    Patient Care Team: [place],  [name], MD as PCP - General Byrnett, [name], MD (General Surgery) [name], MD (Oncology)    Assessment:     Hearing Screening   125Hz  250Hz  500Hz  1000Hz  2000Hz  3000Hz  4000Hz  6000Hz  8000Hz   Right ear:   40 40 0  0    Left ear:   40 40 0  0    Vision Screening Comments: Last vision exam in April 2018   Exercise Activities and Dietary recommendations Current Exercise Habits: Home exercise routine, Type of exercise: walking, Time (Minutes): 45, Frequency (Times/Week): 4, Weekly Exercise (Minutes/Week): 180,  Intensity: Mild, Exercise limited by: None identified  Goals    . Increase physical activity          Starting 06/25/2016, I will continue to walk at least 30-45 min 3-4 days per week.       Fall Risk Fall Risk  06/25/2016 02/24/2016 10/25/2015 10/16/2014  Falls in the past year? No No No No   Depression Screen PHQ 2/9 Scores 06/25/2016 02/24/2016 10/25/2015 10/16/2014  PHQ - 2 Score 0 0 0 0     Cognitive Function MMSE - Mini Mental State Exam 06/25/2016  Orientation to time 5  Orientation to Place 5  Registration 3  Attention/ Calculation 0  Recall 3  Language- name 2 objects 0  Language- repeat 1  Language- follow 3 step command 3  Language- read & follow direction 0  Write a sentence 0  Copy design 0  Total score 20       PLEASE NOTE: A Mini-Cog screen was completed. Maximum score is 20. A value of 0 denotes this part of Folstein MMSE was not completed or the patient failed this part of the Mini-Cog screening.   Mini-Cog Screening Orientation to Time - Max 5 pts Orientation to Place - Max 5 pts Registration - Max 3 pts Recall - Max 3 pts Language Repeat - Max 1 pts Language Follow 3 Step Command - Max 3 pts   Immunization History  Administered Date(s) Administered  . Pneumococcal Conjugate-13 10/16/2014  . Pneumococcal Polysaccharide-23 08/31/2012  . Td 06/16/2001  . Tdap 06/01/2012   Screening Tests Health Maintenance  Topic Date Due  . INFLUENZA  VACCINE  01/12/2021 (Originally 09/16/2016)  . HEMOGLOBIN A1C  12/26/2016  . FOOT EXAM  05/21/2017  . OPHTHALMOLOGY EXAM  06/08/2017  . PNA vac Low Risk Adult (2 of 2 - PPSV23) 08/31/2017  . MAMMOGRAM  04/06/2018  . COLONOSCOPY  10/30/2019  . TETANUS/TDAP  06/02/2022  . DEXA SCAN  Completed  . Hepatitis C Screening  Completed      Plan:     I have personally reviewed and addressed the Medicare Annual Wellness questionnaire and have noted the following in the patient's chart:  A. Medical and social history B. Use of alcohol, tobacco or illicit drugs  C. Current medications and supplements D. Functional ability and status E.  Nutritional status F.  Physical activity G. Advance directives H. List of other physicians I.  Hospitalizations, surgeries, and ER visits in previous 12 months J.  Occidental to include hearing, vision, cognitive, depression L. Referrals and appointments - none  In addition, I have reviewed and discussed with patient certain preventive protocols, quality metrics, and best practice recommendations. A written personalized care plan for preventive services as well as general preventive health recommendations were provided to patient.  See attached scanned questionnaire for additional information.   Signed,   Lindell Noe, MHA, BS, LPN Health Coach

## 2016-06-25 NOTE — Progress Notes (Signed)
PCP notes:   Health maintenance:  A1C - completed Foot exam - per pt, exam completed in April 2018 Eye exam - per pt, exam completed in April 2018 Hep C screening - completed  Abnormal screenings:   Hearing - failed  Patient concerns:   None  Nurse concerns:  None  Next PCP appt:   07/01/16 @ 1130

## 2016-06-26 LAB — TSH: TSH: 2.3 u[IU]/mL (ref 0.35–4.50)

## 2016-06-26 LAB — VITAMIN B12: VITAMIN B 12: 294 pg/mL (ref 211–911)

## 2016-06-26 LAB — VITAMIN D 25 HYDROXY (VIT D DEFICIENCY, FRACTURES): VITD: 31.11 ng/mL (ref 30.00–100.00)

## 2016-06-26 LAB — HEPATITIS C ANTIBODY: HCV AB: NEGATIVE

## 2016-06-27 NOTE — Progress Notes (Signed)
I reviewed health advisor's note, was available for consultation, and agree with documentation and plan.  

## 2016-07-01 ENCOUNTER — Encounter: Payer: Self-pay | Admitting: Family Medicine

## 2016-07-01 ENCOUNTER — Ambulatory Visit (INDEPENDENT_AMBULATORY_CARE_PROVIDER_SITE_OTHER): Payer: Medicare Other | Admitting: Family Medicine

## 2016-07-01 VITALS — BP 116/56 | HR 87 | Temp 98.5°F | Ht 65.5 in | Wt 229.0 lb

## 2016-07-01 DIAGNOSIS — E7800 Pure hypercholesterolemia, unspecified: Secondary | ICD-10-CM | POA: Diagnosis not present

## 2016-07-01 DIAGNOSIS — E119 Type 2 diabetes mellitus without complications: Secondary | ICD-10-CM

## 2016-07-01 DIAGNOSIS — E559 Vitamin D deficiency, unspecified: Secondary | ICD-10-CM | POA: Diagnosis not present

## 2016-07-01 DIAGNOSIS — D509 Iron deficiency anemia, unspecified: Secondary | ICD-10-CM

## 2016-07-01 DIAGNOSIS — R609 Edema, unspecified: Secondary | ICD-10-CM | POA: Diagnosis not present

## 2016-07-01 DIAGNOSIS — C50412 Malignant neoplasm of upper-outer quadrant of left female breast: Secondary | ICD-10-CM | POA: Diagnosis not present

## 2016-07-01 DIAGNOSIS — M949 Disorder of cartilage, unspecified: Secondary | ICD-10-CM | POA: Diagnosis not present

## 2016-07-01 DIAGNOSIS — Z1211 Encounter for screening for malignant neoplasm of colon: Secondary | ICD-10-CM

## 2016-07-01 DIAGNOSIS — M899 Disorder of bone, unspecified: Secondary | ICD-10-CM | POA: Diagnosis not present

## 2016-07-01 DIAGNOSIS — E538 Deficiency of other specified B group vitamins: Secondary | ICD-10-CM

## 2016-07-01 DIAGNOSIS — Z171 Estrogen receptor negative status [ER-]: Secondary | ICD-10-CM | POA: Diagnosis not present

## 2016-07-01 MED ORDER — SPIRONOLACTONE 25 MG PO TABS: 25.0000 mg | ORAL_TABLET | Freq: Every day | ORAL | 1 refills | Status: DC | PRN

## 2016-07-01 MED ORDER — ATORVASTATIN CALCIUM 10 MG PO TABS: 10.0000 mg | ORAL_TABLET | Freq: Every day | ORAL | 11 refills | Status: DC

## 2016-07-01 MED ORDER — CYANOCOBALAMIN 1000 MCG/ML IJ SOLN
1000.0000 ug | Freq: Once | INTRAMUSCULAR | Status: AC
  Administered 2016-07-01: 1000 ug via INTRAMUSCULAR

## 2016-07-01 MED ORDER — ROPINIROLE HCL 1 MG PO TABS: ORAL_TABLET | ORAL | 11 refills | Status: DC

## 2016-07-01 MED ORDER — LISINOPRIL 5 MG PO TABS: 5.0000 mg | ORAL_TABLET | Freq: Every day | ORAL | 11 refills | Status: DC

## 2016-07-01 MED ORDER — OMEPRAZOLE 20 MG PO CPDR: 20.0000 mg | DELAYED_RELEASE_CAPSULE | Freq: Every day | ORAL | 11 refills | Status: DC

## 2016-07-01 NOTE — Assessment & Plan Note (Signed)
cologuard ordered.

## 2016-07-01 NOTE — Assessment & Plan Note (Signed)
Spironolactone works well when needed Px for once weekly or less  Update with results  Watch K (on this as well)

## 2016-07-01 NOTE — Assessment & Plan Note (Signed)
Doing well  Last mammogram neg

## 2016-07-01 NOTE — Assessment & Plan Note (Signed)
Last dexa 9/16 was in the normal range  Continue to monitor  Disc need for calcium/ vitamin D/ wt bearing exercise and bone density test every 2 y to monitor Disc safety/ fracture risk in detail   No falls or fx

## 2016-07-01 NOTE — Assessment & Plan Note (Signed)
Overdue B12 shot Recommend every 1-2 mo (also to help with neuropathy) B12 shot today

## 2016-07-01 NOTE — Progress Notes (Signed)
Subjective:    Patient ID: Suzanne Strong, female    DOB: 1945-09-08, 71 y.o.   MRN: [id number]  HPI  Here for annual f/u of chronic health problems   Feeling pretty good overall  Some more edema in feet lately   Wt Readings from Last 3 Encounters:  07/01/16 229 lb (103.9 kg)  06/25/16 225 lb 8 oz (102.3 kg)  04/10/16 219 lb (99.3 kg)  working hard on her diet -diabetes is doing better Walking longer periods of time  Avoiding carbs - more almond milk/chia seeds  Fruit for carbs  bmi is 37.5   Had AMW on 5/10 Hearing -missed 2000 and 4000 Hz both ears - she notices this / has to ask t repeat  Not bad enough for hearing aides yet  Hx of ear wax build up in the past also  No care gaps or concerns   Will be due for PNA 23 at 5 year mark (last one was 7/14) Had prevnar 8/16  Mammogram 2/18 neg - thrilled  Personal hx of breast cancer  Self breast exam-no changes / she does have some tenderness over her old incision   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 Very happy   Hx of iron def anemia Also 2ndary to bone marrow suppression from her prev chemotx  Lab Results  Component Value Date   WBC 4.9 06/25/2016   HGB 10.0 (L) 06/25/2016   HCT 31.0 (L) 06/25/2016   MCV 85.1 06/25/2016   PLT 224.0 06/25/2016   taking iron  Stable  Not overly tired / about what she should expect for age and schedule/working    Hx of B12 def Lab Results  Component Value Date   VITAMINB12 294 06/25/2016   Down from 65 Not keeping  up with her B12 shots  Last one was last year   Hx of hyperlipidemia Lab Results  Component Value Date   CHOL 156 06/25/2016   CHOL 173 09/18/2014   CHOL 178 08/30/2013   Lab Results  Component Value Date   HDL 49.60 06/25/2016   HDL 50.40 09/18/2014   HDL 46.40 08/30/2013   Lab Results  Component Value Date   LDLCALC 70 06/25/2016   LDLCALC 94 09/18/2014   LDLCALC 102 (H) 08/30/2013   Lab Results  Component Value Date   TRIG 183.0 (H) 06/25/2016   TRIG 144.0 09/18/2014   TRIG 148.0 08/30/2013   Lab Results  Component Value Date   CHOLHDL 3 06/25/2016   CHOLHDL 3 09/18/2014   CHOLHDL 4 08/30/2013   Lab Results  Component Value Date   LDLDIRECT 116.1 05/25/2012   LDLDIRECT 114.0 06/30/2010  LDL is well controlled  Atorvastatin and diet   Patient Active Problem List   Diagnosis Date Noted  . Obstructive sleep apnea 01/01/2016  . Carcinoma of upper-outer quadrant of left breast in female, estrogen receptor negative ([place]) 11/21/2015  . Hypomagnesemia 08/28/2015  .  Initial Medicare annual wellness visit 10/16/2014  . Estrogen deficiency 10/16/2014  . Colon cancer screening 10/16/2014  . Controlled type 2 diabetes mellitus without complication ([place]) 70/62/3762  . RLS (restless legs syndrome) 02/21/2014  . Chronic neck pain 07/25/2010  . Depression 07/07/2010  . Routine general medical examination at a health care facility 06/26/2010  . B12 deficiency 09/04/2009  . Anemia, iron deficiency 08/29/2009  . Anxiety state 08/26/2009  . GASTROPARESIS 08/26/2009  . Vitamin D deficiency 07/19/2008  . Disorder of bone and cartilage 07/19/2007  . Hyperlipidemia 07/18/2007  . EDEMA 07/18/2007   Past Medical History:  Diagnosis Date  . Anemia    iron deficiency and B12 deficiency  . Anxiety   . Breast cancer ([place]) 02/25/2015   upper-outer quadrant of left female breast, Triple Negative. Neo-adjuvant chemo with complete pathologic response.   . Cervical dysplasia     conization  . Diabetes mellitus without complication ([place])   . Diverticulosis   . Fever 04/11/2015  . Gastroparesis    related to previous radiation tx  . GERD (gastroesophageal reflux disease)   . Hyperlipidemia   . Hypertension   . Shingles    Hx of  . 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 APPENDECTOMY    . BREAST BIOPSY Left 02/25/2015   INVASIVE MAMMARY CARCINOMA,Triple negative.  [name] BREAST CYST ASPIRATION    . BREAST EXCISIONAL BIOPSY Left 12/2015   surgery  . BREAST LUMPECTOMY WITH NEEDLE LOCALIZATION Left 10/02/2015   Procedure: BREAST LUMPECTOMY WITH NEEDLE LOCALIZATION;  Surgeon: [name], MD;  Location: [facility] ORS;  Service: General;  Laterality: Left;  . BREAST LUMPECTOMY WITH SENTINEL LYMPH NODE BIOPSY Left 10/02/2015   Procedure: LEFT BREAST WIDE EXCISION WITH SENTINEL LYMPH NODE BX;  Surgeon: [name], MD;  Location: [facility];  Service: General;  Laterality: Left;  . BREAST SURGERY     breast biopsy benign  . CHOLECYSTECTOMY    . ESOPHAGOGASTRODUODENOSCOPY  07/2009   normal with few gastric polyps  . MOLE REMOVAL    . PORT-A-CATH REMOVAL    . PORTACATH PLACEMENT    . PORTACATH PLACEMENT Right 03/12/2015   Procedure: INSERTION PORT-A-CATH;  Surgeon: [name], MD;  Location: [facility];  Service: General;  Laterality: Right;  . RADICAL NECK DISSECTION    . TONSILLECTOMY     cancer treated with chemo and radiation   Social History  Substance Use Topics  . Smoking status: Former Smoker    Packs/day: 0.50    Years: 6.00    Types: Cigarettes    Quit date: 03/10/1984  . Smokeless tobacco: Never Used  . Alcohol use No   Family History  Problem Relation Age of Onset  . Osteoporosis Mother   . Hyperlipidemia Mother   . Depression Mother   . Cancer Mother        skin cancer ? basal cell and lung ca heavy smoker  . Alcohol abuse Father   . Cancer Father        skin CA ? basal cell  . Heart  disease Father        CHF  . Hyperlipidemia Brother   . Hypertension Brother    Allergies  Allergen Reactions  . Amoxicillin     REACTION: rash  . Fluconazole     REACTION: hives   Current Outpatient Prescriptions on File Prior to Visit  Medication Sig Dispense Refill  . ALPRAZolam (XANAX) 0.5  MG tablet Take 1 tablet (0.5 mg total) by mouth at bedtime as needed for sleep. 30 tablet 3  . amitriptyline (ELAVIL) 50 MG tablet Take 1 tablet (50 mg total) by mouth at bedtime. 30 tablet 3  . b complex vitamins tablet Take 1 tablet by mouth daily.      . cyanocobalamin (,VITAMIN B-12,) 1000 MCG/ML injection Inject 1,000 mcg into the muscle every 30 (thirty) days.      . fluticasone (FLONASE) 50 MCG/ACT nasal spray instill 2 sprays into each nostril once daily 16 g 11  . gabapentin (NEURONTIN) 300 MG capsule TAKE 1 CAPSULE BY MOUTH EVERY MORNING AND 2 EVERY EVENING AS DIRECTED 90 capsule 3  . glipiZIDE (GLUCOTROL) 10 MG tablet Take 10 mg by mouth daily before breakfast.     . HYDROcodone-acetaminophen (NORCO/VICODIN) 5-325 MG tablet Take 1 tablet by mouth at bedtime as needed for moderate pain. 30 tablet 0  . JANUMET XR 50-1000 MG TB24 Take 1 tablet by mouth 2 (two) times daily.   0  . lidocaine-prilocaine (EMLA) cream Apply 1 application topically as needed. Apply to port when going through Chemo    . mirtazapine (REMERON) 30 MG tablet take 2 tablet by mouth at bedtime  0  . potassium chloride (K-DUR) 10 MEQ tablet take 1 tablet by mouth once daily 30 tablet 2  . SLOW-MAG 71.5-119 MG TBEC SR tablet take 1 tablet by mouth once daily 60 tablet 2   Current Facility-Administered Medications on File Prior to Visit  Medication Dose Route Frequency Provider Last Rate Last Dose  . sodium chloride flush (NS) 0.9 % injection 10 mL  10 mL Intravenous PRN Cammie Sickle, MD   10 mL at 09/11/15 0845     Review of Systems    Review of Systems  Constitutional: Negative for fever, appetite change,  fatigue and unexpected weight change.  Eyes: Negative for pain and visual disturbance.  Respiratory: Negative for cough and shortness of breath.   Cardiovascular: Negative for cp or palpitations    Gastrointestinal: Negative for nausea, diarrhea and constipation.  Genitourinary: Negative for urgency and frequency.  Skin: Negative for pallor or rash   Neurological: Negative for weakness, light-headedness, numbness and headaches.  Hematological: Negative for adenopathy. Does not bruise/bleed easily.  Psychiatric/Behavioral: Negative for dysphoric mood. The patient is not nervous/anxious.      Objective:   Physical Exam  Constitutional: She appears well-developed and well-nourished. No distress.  obese and well appearing   HENT:  Head: Normocephalic and atraumatic.  Right Ear: External ear normal.  Left Ear: External ear normal.  Mouth/Throat: Oropharynx is clear and moist.  Eyes: Conjunctivae and EOM are normal. Pupils are equal, round, and reactive to light. No scleral icterus.  Neck: Normal range of motion. Neck supple. No JVD present. Carotid bruit is not present. No thyromegaly present.  Baseline scars on neck from radical dissection past   Cardiovascular: Normal rate, regular rhythm, normal heart sounds and intact distal pulses.  Exam reveals no gallop.   Pulmonary/Chest: Effort normal and breath sounds normal. No respiratory distress. She has no wheezes. She exhibits no tenderness.  Abdominal: Soft. Bowel sounds are normal. She exhibits no distension, no abdominal bruit and no mass. There is no tenderness.  Genitourinary: No breast swelling, tenderness, discharge or bleeding.  Genitourinary Comments: Breast exam: No mass, nodules, thickening, tenderness, bulging, retraction, inflamation, nipple discharge or skin changes noted.  No axillary or clavicular LA.     Baseline L lumpectomy  scar    Musculoskeletal: Normal range of motion. She exhibits no edema or tenderness.    Lymphadenopathy:    She has no cervical adenopathy.  Neurological: She is alert. She has normal reflexes. No cranial nerve deficit. She exhibits normal muscle tone. Coordination normal.  Skin: Skin is warm and dry. No rash noted. No erythema. No pallor.  Solar lentigines diffusely   Angiomas on trunk  Psychiatric: She has a normal mood and affect.          Assessment & Plan:   Problem List Items Addressed This Visit      Endocrine   Controlled type 2 diabetes mellitus without complication (HCC) - Primary    Sees Dr Gabriel Carina utd  A1C is below 7  Doing well       Relevant Medications   atorvastatin (LIPITOR) 10 MG tablet   lisinopril (PRINIVIL,ZESTRIL) 5 MG tablet     Musculoskeletal and Integument   Disorder of bone and cartilage    Last dexa 9/16 was in the normal range  Continue to monitor  Disc need for calcium/ vitamin D/ wt bearing exercise and bone density test every 2 y to monitor Disc safety/ fracture risk in detail   No falls or fx         Other   Anemia, iron deficiency    Iron def and also marrow suppression from prev chemo  Continue iron  Stable Continue oncology f/u      Relevant Medications   cyanocobalamin ((VITAMIN B-12)) injection 1,000 mcg (Completed)   B12 deficiency    Overdue B12 shot Recommend every 1-2 mo (also to help with neuropathy) B12 shot today      Relevant Medications   cyanocobalamin ((VITAMIN B-12)) injection 1,000 mcg (Completed)   Carcinoma of upper-outer quadrant of left breast in female, estrogen receptor negative ([place])    Doing well  Last mammogram neg       Colon cancer screening    cologuard ordered      EDEMA    Spironolactone works well when needed Px for once weekly or less  Update with results  Watch K (on this as well)       Hyperlipidemia    Disc goals for lipids and reasons to control them Rev labs with pt Rev low sat fat diet in detail Well controlled with atorvastatin and diet        Relevant Medications   spironolactone (ALDACTONE) 25 MG tablet   atorvastatin (LIPITOR) 10 MG tablet   lisinopril (PRINIVIL,ZESTRIL) 5 MG tablet   Vitamin D deficiency    Level in 30s Will inc to 4000 iu D3 daily  Disc imp to bone and overall health

## 2016-07-01 NOTE — Assessment & Plan Note (Signed)
Iron def and also marrow suppression from prev chemo  Continue iron  Stable Continue oncology f/u

## 2016-07-01 NOTE — Assessment & Plan Note (Signed)
Level in 30s Will inc to 4000 iu D3 daily  Disc imp to bone and overall health

## 2016-07-01 NOTE — Assessment & Plan Note (Signed)
Disc goals for lipids and reasons to control them Rev labs with pt Rev low sat fat diet in detail  Well controlled with atorvastatin and diet  

## 2016-07-01 NOTE — Patient Instructions (Addendum)
Check with your oncologist regarding - ? Do you need a pap smear   If you are interested in the new shingles vaccine (Shingrix) - call your insurance to check on coverage,( you should not get it within 1 month of other vaccines) , then call us for a prescription  for it to take to a pharmacy that gives the shot , or make a nurse visit to get it here depending on your coverage   Increase vitamin D3 over the counter to 4000 iu daily   Try to keep up with a B12 shot every 1-2 months   Take the spironolactone as needed for swelling- if you find you need it often (more than once per week)  then let us know -it can make your potassium too high   We will sign you up for the cologuard program

## 2016-07-01 NOTE — Assessment & Plan Note (Signed)
Sees Dr Gabriel Carina utd  A1C is below 7  Doing well

## 2016-07-15 ENCOUNTER — Telehealth: Payer: Self-pay | Admitting: *Deleted

## 2016-07-15 ENCOUNTER — Telehealth: Payer: Self-pay | Admitting: Internal Medicine

## 2016-07-15 DIAGNOSIS — Z1212 Encounter for screening for malignant neoplasm of rectum: Secondary | ICD-10-CM | POA: Diagnosis not present

## 2016-07-15 DIAGNOSIS — Z1211 Encounter for screening for malignant neoplasm of colon: Secondary | ICD-10-CM | POA: Diagnosis not present

## 2016-07-15 NOTE — Telephone Encounter (Signed)
Called to report that she has developed bil feet swelling that is worse at end of day. She has tried fld pills and elevating them. Asking what she is to do.Please advise

## 2016-07-15 NOTE — Telephone Encounter (Signed)
Denies pain in calves no swelling oin  Calves either, it is in her ankles and feet and her toes hurt from neuropathy. Instructed to get compression stockings and if no improvement she is to call her PCP. She is in agreement with this plan

## 2016-07-15 NOTE — Telephone Encounter (Signed)
Hassan Rowan- please check if swelling s/o DVTs; if yes- then order dopplers; if not recommend bil LE stocking; again if not improved recommend seing next available provider/or see PCP.

## 2016-07-22 HISTORY — PX: OTHER SURGICAL HISTORY: SHX169

## 2016-07-22 LAB — COLOGUARD: Cologuard: NEGATIVE

## 2016-07-23 ENCOUNTER — Other Ambulatory Visit: Payer: Self-pay | Admitting: Internal Medicine

## 2016-07-23 ENCOUNTER — Other Ambulatory Visit: Payer: Self-pay | Admitting: Family Medicine

## 2016-07-23 DIAGNOSIS — C50412 Malignant neoplasm of upper-outer quadrant of left female breast: Secondary | ICD-10-CM

## 2016-07-23 NOTE — Telephone Encounter (Signed)
Spoke with pt and her oncologist does fill Rx, and it was sent to [place] in error, Rx declined and I left a voicemail with the pharmacy letting them know why Rx was declined and advise them to send the refill request to oncologist

## 2016-07-23 NOTE — Telephone Encounter (Signed)
Please ask her about the details Thanks

## 2016-07-23 NOTE — Telephone Encounter (Signed)
I don't see that you have filled this before, ? If oncologist filled it in the past, please advise

## 2016-07-24 ENCOUNTER — Encounter: Payer: Self-pay | Admitting: *Deleted

## 2016-08-03 ENCOUNTER — Other Ambulatory Visit: Payer: Medicare Other

## 2016-08-03 ENCOUNTER — Inpatient Hospital Stay: Payer: Medicare Other | Attending: Internal Medicine

## 2016-08-03 ENCOUNTER — Ambulatory Visit: Payer: Medicare Other | Admitting: Internal Medicine

## 2016-08-03 DIAGNOSIS — Z7984 Long term (current) use of oral hypoglycemic drugs: Secondary | ICD-10-CM | POA: Diagnosis not present

## 2016-08-03 DIAGNOSIS — I10 Essential (primary) hypertension: Secondary | ICD-10-CM | POA: Insufficient documentation

## 2016-08-03 DIAGNOSIS — N879 Dysplasia of cervix uteri, unspecified: Secondary | ICD-10-CM | POA: Diagnosis not present

## 2016-08-03 DIAGNOSIS — G629 Polyneuropathy, unspecified: Secondary | ICD-10-CM | POA: Insufficient documentation

## 2016-08-03 DIAGNOSIS — E119 Type 2 diabetes mellitus without complications: Secondary | ICD-10-CM | POA: Insufficient documentation

## 2016-08-03 DIAGNOSIS — Z171 Estrogen receptor negative status [ER-]: Secondary | ICD-10-CM | POA: Diagnosis not present

## 2016-08-03 DIAGNOSIS — M7989 Other specified soft tissue disorders: Secondary | ICD-10-CM | POA: Insufficient documentation

## 2016-08-03 DIAGNOSIS — C50412 Malignant neoplasm of upper-outer quadrant of left female breast: Secondary | ICD-10-CM | POA: Insufficient documentation

## 2016-08-03 DIAGNOSIS — K219 Gastro-esophageal reflux disease without esophagitis: Secondary | ICD-10-CM | POA: Diagnosis not present

## 2016-08-03 DIAGNOSIS — E538 Deficiency of other specified B group vitamins: Secondary | ICD-10-CM | POA: Insufficient documentation

## 2016-08-03 DIAGNOSIS — Z95828 Presence of other vascular implants and grafts: Secondary | ICD-10-CM

## 2016-08-03 DIAGNOSIS — E785 Hyperlipidemia, unspecified: Secondary | ICD-10-CM | POA: Insufficient documentation

## 2016-08-03 DIAGNOSIS — D509 Iron deficiency anemia, unspecified: Secondary | ICD-10-CM | POA: Diagnosis not present

## 2016-08-03 DIAGNOSIS — Z923 Personal history of irradiation: Secondary | ICD-10-CM | POA: Insufficient documentation

## 2016-08-03 DIAGNOSIS — Z87891 Personal history of nicotine dependence: Secondary | ICD-10-CM | POA: Insufficient documentation

## 2016-08-03 DIAGNOSIS — Z8719 Personal history of other diseases of the digestive system: Secondary | ICD-10-CM | POA: Diagnosis not present

## 2016-08-03 DIAGNOSIS — Z85818 Personal history of malignant neoplasm of other sites of lip, oral cavity, and pharynx: Secondary | ICD-10-CM | POA: Diagnosis not present

## 2016-08-03 DIAGNOSIS — Z79899 Other long term (current) drug therapy: Secondary | ICD-10-CM | POA: Diagnosis not present

## 2016-08-03 DIAGNOSIS — Z9221 Personal history of antineoplastic chemotherapy: Secondary | ICD-10-CM | POA: Insufficient documentation

## 2016-08-03 DIAGNOSIS — Z808 Family history of malignant neoplasm of other organs or systems: Secondary | ICD-10-CM | POA: Diagnosis not present

## 2016-08-03 LAB — COMPREHENSIVE METABOLIC PANEL
ALBUMIN: 3.2 g/dL — AB (ref 3.5–5.0)
ALT: 19 U/L (ref 14–54)
AST: 32 U/L (ref 15–41)
Alkaline Phosphatase: 85 U/L (ref 38–126)
Anion gap: 10 (ref 5–15)
BUN: 12 mg/dL (ref 6–20)
CHLORIDE: 99 mmol/L — AB (ref 101–111)
CO2: 27 mmol/L (ref 22–32)
Calcium: 8.5 mg/dL — ABNORMAL LOW (ref 8.9–10.3)
Creatinine, Ser: 0.69 mg/dL (ref 0.44–1.00)
GFR calc Af Amer: >60 mL/min (ref >60)
GLUCOSE: 133 mg/dL — AB (ref 65–99)
Potassium: 4.1 mmol/L (ref 3.5–5.1)
SODIUM: 136 mmol/L (ref 135–145)
Total Bilirubin: 0.3 mg/dL (ref 0.3–1.2)
Total Protein: 7.1 g/dL (ref 6.5–8.1)

## 2016-08-03 LAB — CBC WITH DIFFERENTIAL/PLATELET
Basophils Absolute: 0 10*3/uL (ref 0–0.1)
Basophils Relative: 0 %
EOS ABS: 0.2 10*3/uL (ref 0–0.7)
EOS PCT: 4 %
HCT: 29.3 % — ABNORMAL LOW (ref 35.0–47.0)
Hemoglobin: 9.7 g/dL — ABNORMAL LOW (ref 12.0–16.0)
LYMPHS PCT: 13 %
Lymphs Abs: 0.6 10*3/uL — ABNORMAL LOW (ref 1.0–3.6)
MCH: 26.9 pg (ref 26.0–34.0)
MCHC: 33.1 g/dL (ref 32.0–36.0)
MCV: 81.4 fL (ref 80.0–100.0)
MONOS PCT: 6 %
Monocytes Absolute: 0.3 10*3/uL (ref 0.2–0.9)
Neutro Abs: 3.7 10*3/uL (ref 1.4–6.5)
Neutrophils Relative %: 77 %
PLATELETS: 218 10*3/uL (ref 150–440)
RBC: 3.6 MIL/uL — AB (ref 3.80–5.20)
RDW: 16.7 % — ABNORMAL HIGH (ref 11.5–14.5)
WBC: 4.8 10*3/uL (ref 3.6–11.0)

## 2016-08-03 LAB — MAGNESIUM: Magnesium: 1.3 mg/dL — ABNORMAL LOW (ref 1.7–2.4)

## 2016-08-03 MED ORDER — SODIUM CHLORIDE 0.9% FLUSH
10.0000 mL | INTRAVENOUS | Status: DC | PRN
  Administered 2016-08-03: 10 mL via INTRAVENOUS
  Filled 2016-08-03: qty 10

## 2016-08-03 MED ORDER — HEPARIN SOD (PORK) LOCK FLUSH 100 UNIT/ML IV SOLN
500.0000 [IU] | Freq: Once | INTRAVENOUS | Status: AC
  Administered 2016-08-03: 500 [IU] via INTRAVENOUS

## 2016-08-04 LAB — CANCER ANTIGEN 27.29: CA 27.29: 8.1 U/mL (ref 0.0–38.6)

## 2016-08-10 ENCOUNTER — Inpatient Hospital Stay (HOSPITAL_BASED_OUTPATIENT_CLINIC_OR_DEPARTMENT_OTHER): Payer: Medicare Other | Admitting: Internal Medicine

## 2016-08-10 ENCOUNTER — Encounter: Payer: Self-pay | Admitting: Neurology

## 2016-08-10 VITALS — BP 135/78 | HR 99 | Temp 97.6°F | Resp 20 | Ht 65.5 in | Wt 225.0 lb

## 2016-08-10 DIAGNOSIS — D509 Iron deficiency anemia, unspecified: Secondary | ICD-10-CM

## 2016-08-10 DIAGNOSIS — K219 Gastro-esophageal reflux disease without esophagitis: Secondary | ICD-10-CM

## 2016-08-10 DIAGNOSIS — Z7984 Long term (current) use of oral hypoglycemic drugs: Secondary | ICD-10-CM

## 2016-08-10 DIAGNOSIS — I10 Essential (primary) hypertension: Secondary | ICD-10-CM | POA: Diagnosis not present

## 2016-08-10 DIAGNOSIS — Z79899 Other long term (current) drug therapy: Secondary | ICD-10-CM

## 2016-08-10 DIAGNOSIS — Z923 Personal history of irradiation: Secondary | ICD-10-CM

## 2016-08-10 DIAGNOSIS — E785 Hyperlipidemia, unspecified: Secondary | ICD-10-CM

## 2016-08-10 DIAGNOSIS — Z87891 Personal history of nicotine dependence: Secondary | ICD-10-CM

## 2016-08-10 DIAGNOSIS — E538 Deficiency of other specified B group vitamins: Secondary | ICD-10-CM | POA: Diagnosis not present

## 2016-08-10 DIAGNOSIS — G629 Polyneuropathy, unspecified: Secondary | ICD-10-CM

## 2016-08-10 DIAGNOSIS — M7989 Other specified soft tissue disorders: Secondary | ICD-10-CM

## 2016-08-10 DIAGNOSIS — Z8719 Personal history of other diseases of the digestive system: Secondary | ICD-10-CM

## 2016-08-10 DIAGNOSIS — E119 Type 2 diabetes mellitus without complications: Secondary | ICD-10-CM

## 2016-08-10 DIAGNOSIS — Z9221 Personal history of antineoplastic chemotherapy: Secondary | ICD-10-CM

## 2016-08-10 DIAGNOSIS — N879 Dysplasia of cervix uteri, unspecified: Secondary | ICD-10-CM | POA: Diagnosis not present

## 2016-08-10 DIAGNOSIS — C50412 Malignant neoplasm of upper-outer quadrant of left female breast: Secondary | ICD-10-CM

## 2016-08-10 DIAGNOSIS — Z808 Family history of malignant neoplasm of other organs or systems: Secondary | ICD-10-CM

## 2016-08-10 DIAGNOSIS — Z171 Estrogen receptor negative status [ER-]: Secondary | ICD-10-CM

## 2016-08-10 DIAGNOSIS — Z85818 Personal history of malignant neoplasm of other sites of lip, oral cavity, and pharynx: Secondary | ICD-10-CM

## 2016-08-10 NOTE — Progress Notes (Signed)
[facility] OFFICE PROGRESS NOTE  Patient Care Team: Tower, [name], MD as PCP - General Byrnett, [name], MD (General Surgery) [name], MD (Oncology)  Cancer Staging No matching staging information was found for the patient.    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 5 CM TUMOR MASS BIOPSIES POSITIVE FOR INVASIVE MAMMARY CARCINOMA TRIPLE NEGATIVE DISEASE(jANUARY, 2017)stage IIIa. ER/PR/her2 Neu-NEGATIVE  # Cytoxan and Adriamycin from March 18, 2015; carbo-taxol x4 cycles  # AUg 18th s/p Lumpec & SLNBx- Complete path CR [ypT0 ypsn0] s/p RT [oct 2017; Dr.Crystal]  3. MUGA scan of the heart shows ejection fraction to be 61% (January, 2017) 4     Carcinoma of upper-outer quadrant of left breast in female, estrogen receptor negative ([place])   11/21/2015 Initial Diagnosis    Carcinoma of upper-outer quadrant of left breast in female, estrogen receptor negative ([place])     INTERVAL HISTORY:  Suzanne Strong 71 y.o.  female pleasant patient above history of stage IIIa breast cancer currently Status post radiation is here for follow-up.   Patient continues to have moderate-Severe neuropathy with tingling and numbness/associated pain in the legs mostly at nighttime. Patient is currently on Neurontin 300 mg daily.patient continues to be on amitriptyline 50 mg a day. Patient states hydrocodone helps with the pain. Denies any new lumps or bumps. Patient complains of mild-to-moderate swelling in the legs especially the ankles/by the end of the day.; Improved after rest.   REVIEW OF SYSTEMS:  A complete 10 point review of system is done which is negative except mentioned above/history of present illness.   PAST MEDICAL HISTORY :  Past Medical History:   Diagnosis Date  . Anemia    iron deficiency and B12 deficiency  . Anxiety   . Breast cancer ([place]) 02/25/2015   upper-outer quadrant of left female breast, Triple Negative. Neo-adjuvant chemo with complete pathologic response.   . Cervical dysplasia    conization  . Diabetes mellitus without complication ([place])   . Diverticulosis   . Fever 04/11/2015  . Gastroparesis    related to previous radiation tx  . GERD (gastroesophageal reflux disease)   . Hyperlipidemia   . Hypertension   . Shingles    Hx of  . 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 APPENDECTOMY    . BREAST BIOPSY Left 02/25/2015   INVASIVE MAMMARY CARCINOMA,Triple negative.  [name] BREAST CYST ASPIRATION    . BREAST EXCISIONAL BIOPSY Left 12/2015   surgery  . BREAST LUMPECTOMY WITH NEEDLE LOCALIZATION Left 10/02/2015   Procedure: BREAST LUMPECTOMY WITH NEEDLE LOCALIZATION;  Surgeon: [name], MD;  Location: [facility] ORS;  Service: General;  Laterality: Left;  . BREAST LUMPECTOMY WITH SENTINEL LYMPH NODE BIOPSY Left 10/02/2015   Procedure: LEFT BREAST WIDE EXCISION WITH SENTINEL LYMPH NODE BX;  Surgeon: [name], MD;  Location: [facility] ORS;  Service: General;  Laterality: Left;  . BREAST SURGERY     breast biopsy benign  . CHOLECYSTECTOMY    . ESOPHAGOGASTRODUODENOSCOPY  07/2009   normal with few gastric polyps  . MOLE REMOVAL    . PORT-A-CATH REMOVAL    . PORTACATH PLACEMENT    . PORTACATH PLACEMENT Right 03/12/2015   Procedure:  INSERTION PORT-A-CATH;  Surgeon: [name], MD;  Location: [facility] ORS;  Service: General;  Laterality: Right;  . RADICAL NECK DISSECTION    . TONSILLECTOMY     cancer treated with chemo and radiation    FAMILY HISTORY :   Family History  Problem Relation Age of Onset  . Osteoporosis Mother   . Hyperlipidemia Mother   . Depression Mother   . Cancer Mother        skin cancer ? basal cell and lung ca  heavy smoker  . Alcohol abuse Father   . Cancer Father        skin CA ? basal cell  . Heart disease Father        CHF  . Hyperlipidemia Brother   . Hypertension Brother     SOCIAL HISTORY:   Social History  Substance Use Topics  . Smoking status: Former Smoker    Packs/day: 0.50    Years: 6.00    Types: Cigarettes    Quit date: 03/10/1984  . Smokeless tobacco: Never Used  . Alcohol use No    ALLERGIES:  is allergic to amoxicillin and fluconazole.  MEDICATIONS:  Current Outpatient Prescriptions  Medication Sig Dispense Refill  . ALPRAZolam (XANAX) 0.5 MG tablet Take 1 tablet (0.5 mg total) by mouth at bedtime as needed for sleep. 30 tablet 3  . amitriptyline (ELAVIL) 50 MG tablet Take 1 tablet (50 mg total) by mouth at bedtime. 30 tablet 3  . atorvastatin (LIPITOR) 10 MG tablet Take 1 tablet (10 mg total) by mouth daily. 30 tablet 11  . b complex vitamins tablet Take 1 tablet by mouth daily.      . cyanocobalamin (,VITAMIN B-12,) 1000 MCG/ML injection Inject 1,000 mcg into the muscle every 30 (thirty) days.      [name] gabapentin (NEURONTIN) 300 MG capsule TAKE 1 CAPSULE BY MOUTH EVERY MORNING AND 2 EVERY EVENING AS DIRECTED 90 capsule 3  . glipiZIDE (GLUCOTROL) 10 MG tablet Take 10 mg by mouth daily before breakfast.     . JANUMET XR 50-1000 MG TB24 Take 1 tablet by mouth 2 (two) times daily.   0  . lidocaine-prilocaine (EMLA) cream Apply 1 application topically as needed. Apply to port when going through Chemo    . lisinopril (PRINIVIL,ZESTRIL) 5 MG tablet Take 1 tablet (5 mg total) by mouth daily. 30 tablet 11  . mirtazapine (REMERON) 30 MG tablet take 2 tablet by mouth at bedtime  0  . omeprazole (PRILOSEC) 20 MG capsule Take 1 capsule (20 mg total) by mouth daily. 30 capsule 11  . rOPINIRole (REQUIP) 1 MG tablet take 1 tablet by mouth every morning if needed and 3 tablets at bedtime 120 tablet 11  . SLOW-MAG 71.5-119 MG TBEC SR tablet take 1 tablet by mouth once daily 60 tablet  2  . spironolactone (ALDACTONE) 25 MG tablet Take 1 tablet (25 mg total) by mouth daily as needed. 30 tablet 1  . fluticasone (FLONASE) 50 MCG/ACT nasal spray instill 2 sprays into each nostril once daily (Patient not taking: Reported on 08/10/2016) 16 g 11  . potassium chloride (K-DUR) 10 MEQ tablet take 1 tablet by mouth once daily (Patient not taking: Reported on 08/10/2016) 30 tablet 2   No current facility-administered medications for this visit.    Facility-Administered Medications Ordered in Other Visits  Medication Dose Route Frequency Provider Last Rate Last Dose  . sodium chloride flush (NS) 0.9 % injection 10 mL  10 mL Intravenous  PRN Cammie Sickle, MD   10 mL at 09/11/15 0845    PHYSICAL EXAMINATION: ECOG PERFORMANCE STATUS: 0 - Asymptomatic  BP 135/78 (BP Location: Left Arm, Patient Position: Sitting)   Pulse 99   Temp 97.6 F (36.4 C) (Tympanic)   Resp 20   Ht 5' 5.5" (1.664 m)   Wt 225 lb (102.1 kg)   BMI 36.87 kg/m   Filed Weights   08/10/16 1153  Weight: 225 lb (102.1 kg)    GENERAL: Well-nourished well-developed; Alert, no distress and comfortable.   Alone. EYES: no pallor or icterus OROPHARYNX: no thrush or ulceration; good dentition  NECK: supple, no masses felt LYMPH:  no palpable lymphadenopathy in the cervical, axillary or inguinal regions LUNGS: clear to auscultation and  No wheeze or crackles HEART/CVS: regular rate & rhythm and no murmurs; grade 1bilateral lower extremity edema ABDOMEN:abdomen soft, non-tender and normal bowel sounds Musculoskeletal:no cyanosis of digits and no clubbing  PSYCH: alert & oriented x 3 with fluent speech NEURO: no focal motor/sensory deficits SKIN:  no rashes or significant lesions  LABORATORY DATA:  I have reviewed the data as listed    Component Value Date/Time   NA 136 08/03/2016 1345   NA 142 03/01/2014 0846   K 4.1 08/03/2016 1345   K 4.3 03/01/2014 0846   CL 99 (L) 08/03/2016 1345   CL 102  03/01/2014 0846   CO2 27 08/03/2016 1345   CO2 28 03/01/2014 0846   GLUCOSE 133 (H) 08/03/2016 1345   GLUCOSE 219 (H) 03/01/2014 0846   BUN 12 08/03/2016 1345   BUN 12 03/01/2014 0846   CREATININE 0.69 08/03/2016 1345   CREATININE 0.95 03/01/2014 0846   CALCIUM 8.5 (L) 08/03/2016 1345   CALCIUM 8.6 03/01/2014 0846   PROT 7.1 08/03/2016 1345   PROT 7.5 03/01/2014 0846   ALBUMIN 3.2 (L) 08/03/2016 1345   ALBUMIN 3.4 03/01/2014 0846   AST 32 08/03/2016 1345   AST 20 03/01/2014 0846   ALT 19 08/03/2016 1345   ALT 28 03/01/2014 0846   ALKPHOS 85 08/03/2016 1345   ALKPHOS 122 (H) 03/01/2014 0846   BILITOT 0.3 08/03/2016 1345   BILITOT 0.2 03/01/2014 0846   GFRNONAA >60 08/03/2016 1345   GFRNONAA >60 03/01/2014 0846   GFRNONAA >60 08/24/2013 1015   GFRAA >60 08/03/2016 1345   GFRAA >60 03/01/2014 0846   GFRAA >60 08/24/2013 1015    No results found for: SPEP, UPEP  Lab Results  Component Value Date   WBC 4.8 08/03/2016   NEUTROABS 3.7 08/03/2016   HGB 9.7 (L) 08/03/2016   HCT 29.3 (L) 08/03/2016   MCV 81.4 08/03/2016   PLT 218 08/03/2016      Chemistry      Component Value Date/Time   NA 136 08/03/2016 1345   NA 142 03/01/2014 0846   K 4.1 08/03/2016 1345   K 4.3 03/01/2014 0846   CL 99 (L) 08/03/2016 1345   CL 102 03/01/2014 0846   CO2 27 08/03/2016 1345   CO2 28 03/01/2014 0846   BUN 12 08/03/2016 1345   BUN 12 03/01/2014 0846   CREATININE 0.69 08/03/2016 1345   CREATININE 0.95 03/01/2014 0846      Component Value Date/Time   CALCIUM 8.5 (L) 08/03/2016 1345   CALCIUM 8.6 03/01/2014 0846   ALKPHOS 85 08/03/2016 1345   ALKPHOS 122 (H) 03/01/2014 0846   AST 32 08/03/2016 1345   AST 20 03/01/2014 0846   ALT 19 08/03/2016 1345  ALT 28 03/01/2014 0846   BILITOT 0.3 08/03/2016 1345   BILITOT 0.2 03/01/2014 0846       RADIOGRAPHIC STUDIES: I have personally reviewed the radiological images as listed and agreed with the findings in the report. No results  found.   ASSESSMENT & PLAN:  Carcinoma of upper-outer quadrant of left breast in female, estrogen receptor negative ([place]) Left breast cancer stage III  ER/PR/her 2 Neu NEG. s/p neoadjuvant chemotherapy; S/p Surgery- complete pathologic response. No clinical evidence of progression or recurrence.  # PN- G-2  on 300 three pills a day; amitriptyline 50 mg daily at bedtime.Not improving. Recommend referral to neurology.    # Bil LE swelling- ankles/ compression; make referral to neurology/[place]  # Mild anemia secondary to bone marrow suppression from previous chemotherapy. Hemoglobin 9.7; cologard-Normal.. Monitor for now.  # Hypomagnesemia- 1.4;  home supplementation;  STOP k supp at home- K was 4 today.   # follow up in 8 weeks port flush/ # Follow up in 4 months/labs/port flush.    Orders Placed This Encounter  Procedures  . CBC with Differential/Platelet    Standing Status:   Future    Standing Expiration Date:   08/10/2017  . Comprehensive metabolic panel    Standing Status:   Future    Standing Expiration Date:   08/10/2017  . Cancer antigen 27.29    Standing Status:   Future    Standing Expiration Date:   08/10/2017  . Magnesium    Standing Status:   Future    Standing Expiration Date:   08/10/2017  . Ambulatory referral to Neurology    Referral Priority:   Routine    Referral Type:   Consultation    Referral Reason:   Specialty Services Required    Requested Specialty:   Neurology    Number of Visits Requested:   [place], MD 08/10/2016 7:05 PM

## 2016-08-10 NOTE — Assessment & Plan Note (Addendum)
Left breast cancer stage III  ER/PR/her 2 Neu NEG. s/p neoadjuvant chemotherapy; S/p Surgery- complete pathologic response. No clinical evidence of progression or recurrence.  # PN- G-2  on 300 three pills a day; amitriptyline 50 mg daily at bedtime.Not improving. Recommend referral to neurology.    # Bil LE swelling- ankles/ compression; make referral to neurology/[place]  # Mild anemia secondary to bone marrow suppression from previous chemotherapy. Hemoglobin 9.7; cologard-Normal.. Monitor for now.  # Hypomagnesemia- 1.4;  home supplementation;  STOP k supp at home- K was 4 today.   # follow up in 8 weeks port flush/ # Follow up in 4 months/labs/port flush.

## 2016-08-24 ENCOUNTER — Other Ambulatory Visit: Payer: Self-pay | Admitting: Family Medicine

## 2016-09-07 ENCOUNTER — Ambulatory Visit
Admission: RE | Admit: 2016-09-07 | Discharge: 2016-09-07 | Disposition: A | Discharge status: Home / Self Care | Payer: Medicare Other | Source: Ambulatory Visit | Attending: Radiation Oncology | Admitting: Radiation Oncology

## 2016-09-07 ENCOUNTER — Encounter: Payer: Self-pay | Admitting: Radiation Oncology

## 2016-09-07 VITALS — BP 113/68 | HR 94 | Temp 97.0°F | Wt 225.0 lb

## 2016-09-07 DIAGNOSIS — Z171 Estrogen receptor negative status [ER-]: Secondary | ICD-10-CM

## 2016-09-07 DIAGNOSIS — C50412 Malignant neoplasm of upper-outer quadrant of left female breast: Secondary | ICD-10-CM

## 2016-09-07 DIAGNOSIS — Z853 Personal history of malignant neoplasm of breast: Secondary | ICD-10-CM | POA: Insufficient documentation

## 2016-09-07 DIAGNOSIS — Z923 Personal history of irradiation: Secondary | ICD-10-CM | POA: Diagnosis not present

## 2016-09-07 DIAGNOSIS — Z9221 Personal history of antineoplastic chemotherapy: Secondary | ICD-10-CM | POA: Diagnosis not present

## 2016-09-07 NOTE — Progress Notes (Signed)
Radiation Oncology Follow up Note  Name: Suzanne Strong   Date:   09/07/2016 MRN:  [id number] DOB: Mar 18, 1945    This 71 y.o. female presents to the clinic today for six-month follow-up status post whole breast radiation to her left breast for invasive mammary carcinoma triple negative.  REFERRING PROVIDER: Tower, [name], MD  HPI: patient is a 71 year old female now out 6 months having completed radiation therapy to her left breast for triple negative invasive mammary carcinoma. Original lesion was 6 cm although had a complete response by neoadjuvant chemotherapy. Seen today in routine follow-up she is doing well. She specifically denies breast tenderness cough or bone pain. She has had recent mammograms which were fine she is not on antiestrogen therapy.  COMPLICATIONS OF TREATMENT: none  FOLLOW UP COMPLIANCE: keeps appointments   PHYSICAL EXAM:  BP 113/68   Pulse 94   Temp (!) 97 F (36.1 C)   Wt 224 lb 15.7 oz (102 kg)   BMI 36.87 kg/m  Lungs are clear to A&P cardiac examination essentially unremarkable with regular rate and rhythm. No dominant mass or nodularity is noted in either breast in 2 positions examined. Incision is well-healed. No axillary or supraclavicular adenopathy is appreciated. Cosmetic result is excellent. Well-developed well-nourished patient in NAD. HEENT reveals PERLA, EOMI, discs not visualized.  Oral cavity is clear. No oral mucosal lesions are identified. Neck is clear without evidence of cervical or supraclavicular adenopathy. Lungs are clear to A&P. Cardiac examination is essentially unremarkable with regular rate and rhythm without murmur rub or thrill. Abdomen is benign with no organomegaly or masses noted. Motor sensory and DTR levels are equal and symmetric in the upper and lower extremities. Cranial nerves II through XII are grossly intact. Proprioception is intact. No peripheral adenopathy or edema is identified. No motor or sensory levels are noted.  Crude visual fields are within normal range.  RADIOLOGY RESULTS: mammograms from February reviewed and BI-RADS 2 benign  PLAN: this time patient continues to do well with no evidence of disease. I'm please were overall progress. I have asked to see her back in 6 months for follow-up and then will start once your follow-up visits. Patient is not on antiestrogen therapy based on the triple negative nature of her disease. Patient knows to call with any concerns.  I would like to take this opportunity to thank you for allowing me to participate in the care of your patient.Armstead Peaks., MD

## 2016-09-16 ENCOUNTER — Encounter: Payer: Self-pay | Admitting: General Surgery

## 2016-09-16 ENCOUNTER — Ambulatory Visit (INDEPENDENT_AMBULATORY_CARE_PROVIDER_SITE_OTHER): Payer: Medicare Other | Admitting: General Surgery

## 2016-09-16 VITALS — BP 132/78 | HR 76 | Resp 12 | Ht 66.0 in | Wt 223.0 lb

## 2016-09-16 DIAGNOSIS — C50412 Malignant neoplasm of upper-outer quadrant of left female breast: Secondary | ICD-10-CM

## 2016-09-16 DIAGNOSIS — Z171 Estrogen receptor negative status [ER-]: Secondary | ICD-10-CM

## 2016-09-16 NOTE — Patient Instructions (Addendum)
Complete hemoccult cards and bring to office or mail. Continue to wear calf stockings.   Follow up in 6 months for bilateral diagnostic mammogram.

## 2016-09-16 NOTE — Progress Notes (Signed)
Patient ID: Suzanne Strong, female   DOB: 09/05/1945, 71 y.o.   MRN: [id number]  Chief Complaint  Patient presents with  . Follow-up    Breast cancer     HPI Suzanne Strong is a 71 y.o. female is here today for a 6 month breast cancer follow up. Patient states she is doing well, no new changes to her breast.She has some occasional pains in her left breast.  She has some swelling in her legs, she wears compression stockings to help.She states she is going to see a Garment/textile technologist for her neuropathy in [place] on 11/13/16. She works at [employer].   The patient reports intermittent dysphagia with a special problem with some pills where she'll cough in the pill will actually come out her nose. HPI  Past Medical History:  Diagnosis Date  . Anemia    iron deficiency and B12 deficiency  . Anxiety   . Breast cancer ([place]) 02/25/2015   upper-outer quadrant of left female breast, Triple Negative. Neo-adjuvant chemo with complete pathologic response.   . Cervical dysplasia    conization  . Diabetes mellitus without complication ([place])   . Diverticulosis   . Fever 04/11/2015  . Gastroparesis    related to previous radiation tx  . GERD (gastroesophageal reflux disease)   . Hyperlipidemia   . Hypertension   . Shingles    Hx of  . 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 APPENDECTOMY    . BREAST BIOPSY Left 02/25/2015   INVASIVE MAMMARY CARCINOMA,Triple negative.  [name] BREAST CYST ASPIRATION    . BREAST EXCISIONAL BIOPSY Left 12/2015   surgery  . BREAST LUMPECTOMY WITH NEEDLE LOCALIZATION Left 10/02/2015   Procedure: BREAST LUMPECTOMY WITH NEEDLE LOCALIZATION;  Surgeon: [name], MD;  Location: [facility] ORS;  Service: General;  Laterality: Left;  . BREAST LUMPECTOMY WITH SENTINEL LYMPH NODE BIOPSY Left 10/02/2015   Procedure: LEFT BREAST WIDE EXCISION WITH SENTINEL LYMPH NODE BX;  Surgeon: [name], MD;  Location: [facility]  ORS;  Service: General;  Laterality: Left;  . BREAST SURGERY     breast biopsy benign  . CHOLECYSTECTOMY    . Cologuard  07/22/2016   Negative  . ESOPHAGOGASTRODUODENOSCOPY  07/2009   normal with few gastric polyps  . MOLE REMOVAL    . PORT-A-CATH REMOVAL    . PORTACATH PLACEMENT    . PORTACATH PLACEMENT Right 03/12/2015   Procedure: INSERTION PORT-A-CATH;  Surgeon: [name], MD;  Location: [facility] ORS;  Service: General;  Laterality: Right;  . RADICAL NECK DISSECTION    . TONSILLECTOMY     cancer treated with chemo and radiation    Family History  Problem Relation Age of Onset  . Osteoporosis Mother   . Hyperlipidemia Mother   . Depression Mother   . Cancer Mother        skin cancer ? basal cell and lung ca heavy smoker  . Alcohol abuse Father   . Cancer Father        skin CA ? basal cell  . Heart disease Father        CHF  . Hyperlipidemia Brother   . Hypertension Brother     Social History Social History  Substance Use Topics  . Smoking status: Former Smoker    Packs/day: 0.50    Years: 6.00    Types: Cigarettes    Quit date: 03/10/1984  . Smokeless tobacco: Never  Used  . Alcohol use No    Allergies  Allergen Reactions  . Amoxicillin     REACTION: rash  . Fluconazole     REACTION: hives    Current Outpatient Prescriptions  Medication Sig Dispense Refill  . ALPRAZolam (XANAX) 0.5 MG tablet Take 1 tablet (0.5 mg total) by mouth at bedtime as needed for sleep. 30 tablet 3  . amitriptyline (ELAVIL) 50 MG tablet Take 1 tablet (50 mg total) by mouth at bedtime. 30 tablet 3  . atorvastatin (LIPITOR) 10 MG tablet Take 1 tablet (10 mg total) by mouth daily. 30 tablet 11  . b complex vitamins tablet Take 1 tablet by mouth daily.      . cyanocobalamin (,VITAMIN B-12,) 1000 MCG/ML injection Inject 1,000 mcg into the muscle every 30 (thirty) days.      . fluticasone (FLONASE) 50 MCG/ACT nasal spray instill 2 sprays into each nostril once daily 16 g 11  .  gabapentin (NEURONTIN) 300 MG capsule TAKE 1 CAPSULE BY MOUTH EVERY MORNING AND 2 EVERY EVENING AS DIRECTED 90 capsule 3  . glipiZIDE (GLUCOTROL) 10 MG tablet Take 10 mg by mouth daily before breakfast.     . JANUMET XR 50-1000 MG TB24 Take 1 tablet by mouth 2 (two) times daily.   0  . lidocaine-prilocaine (EMLA) cream Apply 1 application topically as needed. Apply to port when going through Chemo    . lisinopril (PRINIVIL,ZESTRIL) 5 MG tablet Take 1 tablet (5 mg total) by mouth daily. 30 tablet 11  . mirtazapine (REMERON) 30 MG tablet take 2 tablet by mouth at bedtime  0  . omeprazole (PRILOSEC) 20 MG capsule Take 1 capsule (20 mg total) by mouth daily. 30 capsule 11  . rOPINIRole (REQUIP) 1 MG tablet take 1 tablet by mouth every morning if needed and 3 tablets at bedtime 120 tablet 11  . SLOW-MAG 71.5-119 MG TBEC SR tablet take 1 tablet by mouth once daily 60 tablet 2  . spironolactone (ALDACTONE) 25 MG tablet take 1 tablet by mouth once daily if needed 30 tablet 5   No current facility-administered medications for this visit.    Facility-Administered Medications Ordered in Other Visits  Medication Dose Route Frequency Provider Last Rate Last Dose  . sodium chloride flush (NS) 0.9 % injection 10 mL  10 mL Intravenous PRN Cammie Sickle, MD   10 mL at 09/11/15 0845    Review of Systems Review of Systems  Constitutional: Negative.   Respiratory: Negative.   Cardiovascular: Negative.     Blood pressure 132/78, pulse 76, resp. rate 12, height 5\' 6"  (1.676 m), weight 223 lb (101.2 kg).  Physical Exam Physical Exam  Constitutional: She is oriented to person, place, and time. She appears well-developed and well-nourished.  Eyes: Conjunctivae are normal. No scleral icterus.  Neck: Neck supple. No thyromegaly present.  Cardiovascular: Normal rate, regular rhythm, normal heart sounds and intact distal pulses.   Pulmonary/Chest: Effort normal and breath sounds normal. Right breast  exhibits no inverted nipple, no mass, no nipple discharge, no skin change and no tenderness. Left breast exhibits tenderness ( pectoralis muscle). Left breast exhibits no inverted nipple, no mass, no nipple discharge and no skin change.    Musculoskeletal:       Right foot: There is swelling.       Left foot: There is swelling.  Lymphadenopathy:    She has no cervical adenopathy.    She has no axillary adenopathy.  Left: No supraclavicular adenopathy present.  Neurological: She is alert and oriented to person, place, and time.  Skin: Skin is warm and dry.    Data Reviewed No new data/   Assessment    Lower extremity edema related to activity, with worsening neuropathy symptoms.  Intermittent use of Aldactone for diuresis, presently using 25 mg 2-3 times per week.  Inconsistent use of compressive stockings.  Long history of anemia with negative endoscopy in 2011, persistent, slightly worsening anemia in the year since completing neo--adjuvant chemotherapy.  Intermittent dysphagia, likely related to previous neck dissection and radiation. No recent ENT evaluation.     Plan The patient has been asked to contact [facility] (she has seen Dr. Tami Ribas since Dr. Ainsley Spinner departure from the practice) for evaluation of her intermittent dysphagia.  Recommend patient to complete hemoccult cards to confirm no GI blood loss (negative Cologuard by patient report)..  Prescription for calf high median compression stockings given to patient.  Patient has been asked to make use of a trial of one half of an Aldactone tablet daily to see if this better controls her late day lower extremity edema with exacerbation of her neuropathy pain (sees which can awaken her at sleep at night).  Follow up in 6 months for bilateral diagnostic mammogram.  HPI, Physical Exam, Assessment and Plan have been scribed under the direction and in the presence of Hervey Ard, MD.  Verlene Mayer, CMA  I  have completed the exam and reviewed the above documentation for accuracy and completeness.  I agree with the above.  Haematologist has been used and any errors in dictation or transcription are unintentional.  Hervey Ard, M.D., F.A.C.S.     [name] 09/16/2016, 8:49 PM

## 2016-09-24 DIAGNOSIS — E113393 Type 2 diabetes mellitus with moderate nonproliferative diabetic retinopathy without macular edema, bilateral: Secondary | ICD-10-CM | POA: Diagnosis not present

## 2016-09-25 DIAGNOSIS — F411 Generalized anxiety disorder: Secondary | ICD-10-CM | POA: Diagnosis not present

## 2016-10-02 ENCOUNTER — Ambulatory Visit (INDEPENDENT_AMBULATORY_CARE_PROVIDER_SITE_OTHER): Payer: Medicare Other

## 2016-10-02 DIAGNOSIS — D649 Anemia, unspecified: Secondary | ICD-10-CM

## 2016-10-02 LAB — POC HEMOCCULT BLD/STL (HOME/3-CARD/SCREEN)
Card #2 Fecal Occult Blod, POC: NEGATIVE
Card #2 Fecal Occult Blod, POC: NEGATIVE
Card #3 Fecal Occult Blood, POC: POSITIVE
FECAL OCCULT BLD: NEGATIVE
FECAL OCCULT BLD: NEGATIVE
Fecal Occult Blood, POC: NEGATIVE

## 2016-10-06 DIAGNOSIS — F411 Generalized anxiety disorder: Secondary | ICD-10-CM | POA: Diagnosis not present

## 2016-10-12 ENCOUNTER — Inpatient Hospital Stay: Payer: Medicare Other

## 2016-10-14 ENCOUNTER — Telehealth: Payer: Self-pay | Admitting: *Deleted

## 2016-10-14 NOTE — Telephone Encounter (Signed)
-----   Message from [name], MD sent at 10/13/2016  4:12 PM EDT ----- Please let the patient know all the stool cards were negative. See if the compressive stockings prescribed at the last visit have been helpful and if she is taking Aldactone daily. Thank you ----- Message ----- From: Lesly Rubenstein, LPN Sent: 3/55/2174   1:02 PM To: [name], MD

## 2016-10-15 ENCOUNTER — Encounter: Payer: Self-pay | Admitting: *Deleted

## 2016-10-20 ENCOUNTER — Inpatient Hospital Stay: Payer: Medicare Other | Attending: Internal Medicine

## 2016-10-20 DIAGNOSIS — Z853 Personal history of malignant neoplasm of breast: Secondary | ICD-10-CM | POA: Diagnosis not present

## 2016-10-20 DIAGNOSIS — Z452 Encounter for adjustment and management of vascular access device: Secondary | ICD-10-CM | POA: Diagnosis not present

## 2016-10-20 DIAGNOSIS — Z95828 Presence of other vascular implants and grafts: Secondary | ICD-10-CM

## 2016-10-20 MED ORDER — SODIUM CHLORIDE 0.9% FLUSH
10.0000 mL | INTRAVENOUS | Status: DC | PRN
  Administered 2016-10-20: 10 mL via INTRAVENOUS
  Filled 2016-10-20: qty 10

## 2016-10-20 MED ORDER — HEPARIN SOD (PORK) LOCK FLUSH 100 UNIT/ML IV SOLN
500.0000 [IU] | Freq: Once | INTRAVENOUS | Status: AC
  Administered 2016-10-20: 500 [IU] via INTRAVENOUS

## 2016-10-21 NOTE — Telephone Encounter (Signed)
Patient responded via [facility]. Thank you for your message/reply, sorry I missed your calls. I am taking the 1/2 of pill Aldactone daily, sorry to say I have not been able to get the prescribed compressive stockings yet I hope to go this week. I think though there is some improvement on my left hand. Thank you and Dr. B for following up on me and my condition. I will let you know if the stockings help. Thanks [employer]

## 2016-11-13 ENCOUNTER — Encounter: Payer: Self-pay | Admitting: Neurology

## 2016-11-13 ENCOUNTER — Ambulatory Visit (INDEPENDENT_AMBULATORY_CARE_PROVIDER_SITE_OTHER): Payer: Medicare Other | Admitting: Neurology

## 2016-11-13 VITALS — BP 140/70 | HR 76 | Ht 66.0 in | Wt 226.4 lb

## 2016-11-13 DIAGNOSIS — T451X5A Adverse effect of antineoplastic and immunosuppressive drugs, initial encounter: Principal | ICD-10-CM

## 2016-11-13 DIAGNOSIS — G620 Drug-induced polyneuropathy: Secondary | ICD-10-CM

## 2016-11-13 DIAGNOSIS — M792 Neuralgia and neuritis, unspecified: Secondary | ICD-10-CM

## 2016-11-13 DIAGNOSIS — G5601 Carpal tunnel syndrome, right upper limb: Secondary | ICD-10-CM | POA: Insufficient documentation

## 2016-11-13 NOTE — Progress Notes (Signed)
[name] - Initial Visit   Date: 11/13/16  Suzanne Strong MRN: [id number] DOB: April 18, 1945   Dear Dr. Rogue Bussing:  Thank you for your kind referral of Suzanne Strong for consultation of chemotherapy-induced neuropathy. Although her history is well known to you, please allow [place] to reiterate it for the purpose of our medical record. The patient was accompanied to the clinic by self.    History of Present Illness: Suzanne Strong is a 71 y.o. right-handed Caucasian female with squamous cell carcinoma of the tonsils (2007), left breast cancer (2017) s/p chemotherapy, radiation, and lumpectomy, diabetes mellitus, (HbA1c <7.0), hyperlipidemia, anxiety/depsression, GERD, and RLS presenting for evaluation of painful paresthesias of the hands and feet.    She completed chemotherapy with cytoxan and adriamycin (January - April 2017) followed by carboplatin and taxol x 4 cycles (April 2017 - November 2017).  During the last two cycles, she recalls having difficulty with buttoning because of inability to feel her fingers.  She also started developing numbness and tingling of the feet.  She was hoping that symptoms would have improved after completing chemotherapy, but her pain persisted.  She was started on Cymbalta, but did not tolerate this.  Currently, she takes gabapentin 600mg  twice daily and amitriptyline 50mg  at bedtime which has provided some relief. She no longer has painful tingling of the feet or left hand, but it continues to feel numb and cold to the level of the ankles.  She is mostly bothered by severe sharp shooting pain of the right hand, especially thumb, index finger, and middle finger, which often wakes her up from sleeping.   She enjoys knitting, needlework, and cooking which can sometimes aggravate symptoms. 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  She has noticed reduced sensation  of the hands and may not always feel the warmth of the pots and has burned the right had a few times.  She has difficulty with holding objects and picking up small items.     Out-side paper records, electronic medical record, and images have been reviewed where available and summarized as:  Lab Results  Component Value Date   TSH 2.30 06/25/2016   Lab Results  Component Value Date   VITAMINB12 294 06/25/2016    Past Medical History:  Diagnosis Date  . Anemia    iron deficiency and B12 deficiency  . Anxiety   . Breast cancer ([place]) 02/25/2015   upper-outer quadrant of left female breast, Triple Negative. Neo-adjuvant chemo with complete pathologic response.   . Cervical dysplasia    conization  . Diabetes mellitus without complication ([place])   . Diverticulosis   . Fever 04/11/2015  . Gastroparesis    related to previous radiation tx  . GERD (gastroesophageal reflux disease)   . Hyperlipidemia   . Hypertension   . Shingles    Hx of  . 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[place]) 2006    Past Surgical History:  Procedure Laterality Date  . APPENDECTOMY    . BREAST BIOPSY Left 02/25/2015   INVASIVE MAMMARY CARCINOMA,Triple negative.  [name] BREAST CYST ASPIRATION    . BREAST EXCISIONAL BIOPSY Left 12/2015   surgery  . BREAST LUMPECTOMY WITH NEEDLE LOCALIZATION Left 10/02/2015   Procedure: BREAST LUMPECTOMY WITH NEEDLE LOCALIZATION;  Surgeon: [name], MD;  Location: [facility] ORS;  Service: General;  Laterality: Left;  . BREAST LUMPECTOMY WITH SENTINEL LYMPH NODE BIOPSY Left 10/02/2015   Procedure: LEFT BREAST  WIDE EXCISION WITH SENTINEL LYMPH NODE BX;  Surgeon: [name], MD;  Location: [facility] ORS;  Service: General;  Laterality: Left;  . BREAST SURGERY     breast biopsy benign  . CHOLECYSTECTOMY    . Cologuard  07/22/2016   Negative  . ESOPHAGOGASTRODUODENOSCOPY  07/2009   normal with few gastric polyps  . MOLE REMOVAL    . PORT-A-CATH REMOVAL    . PORTACATH PLACEMENT    . PORTACATH  PLACEMENT Right 03/12/2015   Procedure: INSERTION PORT-A-CATH;  Surgeon: [name], MD;  Location: [facility] ORS;  Service: General;  Laterality: Right;  . RADICAL NECK DISSECTION    . TONSILLECTOMY     cancer treated with chemo and radiation     Medications:  Outpatient Encounter Prescriptions as of 11/13/2016  Medication Sig  . ALPRAZolam (XANAX) 0.5 MG tablet Take 1 tablet (0.5 mg total) by mouth at bedtime as needed for sleep.  [name] amitriptyline (ELAVIL) 50 MG tablet Take 1 tablet (50 mg total) by mouth at bedtime.  [name] atorvastatin (LIPITOR) 10 MG tablet Take 1 tablet (10 mg total) by mouth daily.  [name] b complex vitamins tablet Take 1 tablet by mouth daily.    . cyanocobalamin (,VITAMIN B-12,) 1000 MCG/ML injection Inject 1,000 mcg into the muscle every 30 (thirty) days.    [name] gabapentin (NEURONTIN) 300 MG capsule TAKE 1 CAPSULE BY MOUTH EVERY MORNING AND 2 EVERY EVENING AS DIRECTED (Patient taking differently: 2 po tid)  . glipiZIDE (GLUCOTROL) 10 MG tablet Take 10 mg by mouth daily before breakfast.   . JANUMET XR 50-1000 MG TB24 Take 1 tablet by mouth 2 (two) times daily.   [name] lidocaine-prilocaine (EMLA) cream Apply 1 application topically as needed. Apply to port when going through Chemo  . lisinopril (PRINIVIL,ZESTRIL) 5 MG tablet Take 1 tablet (5 mg total) by mouth daily.  . mirtazapine (REMERON) 30 MG tablet take 2 tablet by mouth at bedtime  . omeprazole (PRILOSEC) 20 MG capsule Take 1 capsule (20 mg total) by mouth daily.  [name] rOPINIRole (REQUIP) 1 MG tablet take 1 tablet by mouth every morning if needed and 3 tablets at bedtime  . sertraline (ZOLOFT) 100 MG tablet Take 200 mg by mouth daily.  [name] spironolactone (ALDACTONE) 25 MG tablet take 1 tablet by mouth once daily if needed  . [DISCONTINUED] fluticasone (FLONASE) 50 MCG/ACT nasal spray instill 2 sprays into each nostril once daily  . [DISCONTINUED] SLOW-MAG 71.5-119 MG TBEC SR tablet take 1 tablet by mouth once daily    Facility-Administered Encounter Medications as of 11/13/2016  Medication  . sodium chloride flush (NS) 0.9 % injection 10 mL     Allergies:  Allergies  Allergen Reactions  . Amoxicillin     REACTION: rash  . Fluconazole     REACTION: hives    Family History: Family History  Problem Relation Age of Onset  . Osteoporosis Mother   . Hyperlipidemia Mother   . Depression Mother   . Cancer Mother        skin cancer ? basal cell and lung ca heavy smoker  . Alcohol abuse Father   . Cancer Father        skin CA ? basal cell  . Heart disease Father        CHF  . Hyperlipidemia Brother   . Hypertension Brother     Social History: Social History  Substance Use Topics  . Smoking status: Former Smoker    Packs/day: 0.50  Years: 6.00    Types: Cigarettes    Quit date: 03/10/1984  . Smokeless tobacco: Never Used  . Alcohol use No   Social History   Social History Narrative   Lives with husband in a one story home.  No children.  Works as Radiation protection practitioner for [employer].  Education: college.     Review of Systems:  CONSTITUTIONAL: No fevers, chills, night sweats, or weight loss.   EYES: No visual changes or eye pain ENT: No hearing changes.  No history of nose bleeds.   RESPIRATORY: No cough, wheezing and shortness of breath.   CARDIOVASCULAR: Negative for chest pain, and palpitations.   GI: Negative for abdominal discomfort, blood in stools or black stools.  No recent change in bowel habits.   GU:  No history of incontinence.   MUSCLOSKELETAL: No history of joint pain or swelling.  No myalgias.   SKIN: Negative for lesions, rash, and itching.   HEMATOLOGY/ONCOLOGY: Negative for prolonged bleeding, bruising easily, and swollen nodes.  +history of cancer.   ENDOCRINE: Negative for cold or heat intolerance, polydipsia or goiter.   PSYCH:  No depression or anxiety symptoms.   NEURO: As Above.   Vital Signs:  BP 140/70   Pulse 76   Ht 5\' 6"  (1.676 m)   Wt 226 lb 6  oz (102.7 kg)   SpO2 92%   BMI 36.54 kg/m    General Medical Exam:   General:  Well appearing, comfortable.   Eyes/ENT: see cranial nerve examination.   Neck: No masses appreciated.  Full range of motion without tenderness.  No carotid bruits. Respiratory:  Clear to auscultation, good air entry bilaterally.   Cardiac:  Regular rate and rhythm, no murmur.   Extremities:  No deformities, edema, or skin discoloration.  Skin:  No rashes or lesions.  Neurological Exam: MENTAL STATUS including orientation to time, place, person, recent and remote memory, attention span and concentration, language, and fund of knowledge is normal.  Speech is not dysarthric.  CRANIAL NERVES: II:  No visual field defects.  Unremarkable fundi.   III-IV-VI: Pupils equal round and reactive to light.  Normal conjugate, extra-ocular eye movements in all directions of gaze.  No nystagmus.  No ptosis.   V:  Normal facial sensation.     VII:  Normal facial symmetry and movements.  VIII:  Normal hearing and vestibular function.   IX-X:  Normal palatal movement.   XI:  Normal shoulder shrug and head rotation.   XII:  Normal tongue strength and range of motion, no deviation or fasciculation.  MOTOR:  No atrophy, fasciculations or abnormal movements.  No pronator drift.  Tone is normal.    Right Upper Extremity:    Left Upper Extremity:    Deltoid  5/5   Deltoid  5/5   Biceps  5/5   Biceps  5/5   Triceps  5/5   Triceps  5/5   Wrist extensors  5/5   Wrist extensors  5/5   Wrist flexors  5/5   Wrist flexors  5/5   Finger extensors  5/5   Finger extensors  5/5   Finger flexors  5/5   Finger flexors  5/5   Dorsal interossei  5/5   Dorsal interossei  5/5   Abductor pollicis  4+/5   Abductor pollicis  5/5   Tone (Ashworth scale)  0  Tone (Ashworth scale)  0   Right Lower Extremity:    Left Lower Extremity:  Hip flexors  5/5   Hip flexors  5/5   Hip extensors  5/5   Hip extensors  5/5   Knee flexors  5/5   Knee  flexors  5/5   Knee extensors  5/5   Knee extensors  5/5   Dorsiflexors  5/5   Dorsiflexors  5/5   Plantarflexors  5/5   Plantarflexors  5/5   Toe extensors  5/5   Toe extensors  5/5   Toe flexors  5/5   Toe flexors  5/5   Tone (Ashworth scale)  0  Tone (Ashworth scale)  0   MSRs:  Right                                                                 Left brachioradialis 2+  brachioradialis 2+  biceps 2+  biceps 2+  triceps 2+  triceps 2+  patellar tr  Patellar tr  ankle jerk 0  ankle jerk 0  Hoffman no  Hoffman no  plantar response down  plantar response down   SENSORY: Reduced temperature and pin prick over the right median distribution.  Vibration is reduced to 75% at there right great toe and intact on the left.  Temperature is reduced below the ankles bilaterally.  Pin prick is intact in the legs and feet.   Romberg's sign absent. Tinel's positive at the right wrist.   COORDINATION/GAIT: Normal finger-to- nose-finger and heel-to-shin.  Intact rapid alternating movements bilaterally.  Able to rise from a chair without using arms.  Gait narrow based and stable. Tandem and stressed gait intact.    IMPRESSION: Suzanne Strong is a 71 year-old female with left breast cancer (2017) s/p chemotherapy, radiation, and lumpectomy and diabetes mellitus referred for evaluation of paresthesias of the hands and feet, especially right hand.  Her history and neurological exam is suggestive of both (1) right carpal tunnel syndrome and (2) chemotherapy-induced neuropathy of the feet. Because she is mostly bothered by her right hand symptoms, she underwent NCS/EMG of the right hand (see procedure note) which showed severe right carpal tunnel syndrome, no evidence of a neuropathy involving the hand.  I discussed management options including using a wrist splint, optimizing gabapentin, steroid injection, and surgical evaluation for CTS release.  Based on the severity of her CTS, I would like her to see  orthopeadic hand specialists for surgical evaluation.  In the meantime, she will recommended to start using a wrist splint and increase gabapentin to 600mg  in the morning and 900mg  at bedtime.  Regarding her chemotherapy-induced neuropathy, this is primarily affecting her feet only and painful paresthesias are well controlled on gabapentin and amitriptyline 50mg  at bedtime.  She does not have weakness or sensory ataxia. I explained that negative symptoms of numbness, cold sensation, heaviness, unfortunately do not respond to pharmacological treatment and recovery can take years.  Given that she also has diabetes mellitus, I do suspect that she will have lasting sensory deficits.  Sometimes vitamin B12 1074mcg daily can help and being that her level was low-normal, I think it is reasonable to offer a trial of this.    Return to clinic as needed.  The duration of this appointment visit was 40 minutes of face-to-face time with the patient, excluding procedure time.  Greater than 50% of this time was spent in counseling, explanation of diagnosis, planning of further management, and coordination of care.   Thank you for allowing me to participate in patient's care.  If I can answer any additional questions, I would be pleased to do so.    Sincerely,    Fahima Cifelli K. Posey Pronto, DO

## 2016-11-13 NOTE — Patient Instructions (Addendum)
1.  Start using a wrist splint to prevent over flexion of the right wrist 2.  OK to take extra gabapentin at bedtime for your pain 3.  We will send a referral to [facility]  Return to clinic as needed

## 2016-11-13 NOTE — Procedures (Signed)
[facility]  [address]  [place], [place] [address] Tel: [phone] Fax:  [phone] Test Date:  11/13/2016  Patient: Suzanne Strong DOB: 1945/03/09 Physician: Narda Amber, DO  Sex: Female Height: 5\' 6"  Ref Phys: Narda Amber, DO  ID#: [id number] Temp: 33.6C Technician:    Patient Complaints: This is a 71 year old female with history of breast cancer s/p chemotherapy, radiation, and lumpectomy referred for evaluation of right hand paresthesias and pain.  NCV & EMG Findings: Extensive electrodiagnostic testing of the right upper extremity shows:  1. Right median sensory response is absent. Right radial and ulnar sensory responses are within normal limits. 2. Right median motor response shows prolonged latency (6.8 ms) and normal amplitude. Right ulnar motor responses within normal limits. 3. Chronic motor axon loss changes isolated to the right abductor pollicis brevis muscle, without accompanied active denervation.  Impression: 1. Right median neuropathy at or distal to the wrist, consistent with the clinical diagnosis of carpal tunnel syndrome. Overall, these findings are severe in degree electrically. 2. There is no evidence of a sensorimotor polyneuropathy affecting the right upper extremity.   ___________________________ Narda Amber, DO    Nerve Conduction Studies Anti Sensory Summary Table   Site NR Peak (ms) Norm Peak (ms) P-T Amp (V) Norm P-T Amp  Right Median Anti Sensory (2nd Digit)  33.6C  Wrist NR  <3.8  >10  Right Radial Anti Sensory (Base 1st Digit)  33.6C  Wrist    1.8 <2.8 12.5 >10  Right Ulnar Anti Sensory (5th Digit)  33.6C  Wrist    2.9 <3.2 25.2 >5   Motor Summary Table   Site NR Onset (ms) Norm Onset (ms) O-P Amp (mV) Norm O-P Amp Site1 Site2 Delta-0 (ms) Dist (cm) Vel (m/s) Norm Vel (m/s)  Right Median Motor (Abd Poll Brev)  33.6C  Wrist    6.8 <4.0 6.4 >5 Elbow Wrist 5.6 28.5 51 >50  Elbow    12.4  5.9           Right Ulnar Motor (Abd Dig Minimi)  33.6C  Wrist    2.3 <3.1 7.7 >7 B Elbow Wrist 4.0 24.0 60 >50  B Elbow    6.3  7.2  A Elbow B Elbow 1.9 10.0 53 >50  A Elbow    8.2  7.0          EMG   Side Muscle Ins Act Fibs Psw Fasc Number Recrt Dur Dur. Amp Amp. Poly Poly. Comment  Right 1stDorInt Nml Nml Nml Nml Nml Nml Nml Nml Nml Nml Nml Nml N/A  Right Abd Poll Brev Nml Nml Nml Nml 2- Rapid Many 1+ Many 1+ Nml Nml N/A  Right PronatorTeres Nml Nml Nml Nml Nml Nml Nml Nml Nml Nml Nml Nml N/A  Right Biceps Nml Nml Nml Nml Nml Nml Nml Nml Nml Nml Nml Nml N/A  Right Triceps Nml Nml Nml Nml Nml Nml Nml Nml Nml Nml Nml Nml N/A  Right Deltoid Nml Nml Nml Nml Nml Nml Nml Nml Nml Nml Nml Nml N/A      Waveforms:

## 2016-12-07 ENCOUNTER — Inpatient Hospital Stay: Payer: Medicare Other | Attending: Internal Medicine

## 2016-12-07 ENCOUNTER — Inpatient Hospital Stay (HOSPITAL_BASED_OUTPATIENT_CLINIC_OR_DEPARTMENT_OTHER): Payer: Medicare Other | Admitting: Internal Medicine

## 2016-12-07 VITALS — BP 136/68 | HR 93 | Temp 96.9°F | Resp 18 | Wt 226.8 lb

## 2016-12-07 DIAGNOSIS — Z923 Personal history of irradiation: Secondary | ICD-10-CM

## 2016-12-07 DIAGNOSIS — R600 Localized edema: Secondary | ICD-10-CM

## 2016-12-07 DIAGNOSIS — Z79899 Other long term (current) drug therapy: Secondary | ICD-10-CM

## 2016-12-07 DIAGNOSIS — G629 Polyneuropathy, unspecified: Secondary | ICD-10-CM | POA: Diagnosis not present

## 2016-12-07 DIAGNOSIS — K219 Gastro-esophageal reflux disease without esophagitis: Secondary | ICD-10-CM | POA: Diagnosis not present

## 2016-12-07 DIAGNOSIS — I10 Essential (primary) hypertension: Secondary | ICD-10-CM

## 2016-12-07 DIAGNOSIS — D649 Anemia, unspecified: Secondary | ICD-10-CM | POA: Insufficient documentation

## 2016-12-07 DIAGNOSIS — Z171 Estrogen receptor negative status [ER-]: Secondary | ICD-10-CM | POA: Insufficient documentation

## 2016-12-07 DIAGNOSIS — Z880 Allergy status to penicillin: Secondary | ICD-10-CM | POA: Insufficient documentation

## 2016-12-07 DIAGNOSIS — E785 Hyperlipidemia, unspecified: Secondary | ICD-10-CM | POA: Insufficient documentation

## 2016-12-07 DIAGNOSIS — Z87891 Personal history of nicotine dependence: Secondary | ICD-10-CM | POA: Insufficient documentation

## 2016-12-07 DIAGNOSIS — Z9221 Personal history of antineoplastic chemotherapy: Secondary | ICD-10-CM | POA: Insufficient documentation

## 2016-12-07 DIAGNOSIS — C50412 Malignant neoplasm of upper-outer quadrant of left female breast: Secondary | ICD-10-CM | POA: Diagnosis not present

## 2016-12-07 DIAGNOSIS — E538 Deficiency of other specified B group vitamins: Secondary | ICD-10-CM | POA: Diagnosis not present

## 2016-12-07 DIAGNOSIS — Z7984 Long term (current) use of oral hypoglycemic drugs: Secondary | ICD-10-CM | POA: Insufficient documentation

## 2016-12-07 DIAGNOSIS — E119 Type 2 diabetes mellitus without complications: Secondary | ICD-10-CM | POA: Insufficient documentation

## 2016-12-07 DIAGNOSIS — Z85818 Personal history of malignant neoplasm of other sites of lip, oral cavity, and pharynx: Secondary | ICD-10-CM

## 2016-12-07 DIAGNOSIS — Z95828 Presence of other vascular implants and grafts: Secondary | ICD-10-CM

## 2016-12-07 LAB — CBC WITH DIFFERENTIAL/PLATELET
BASOS ABS: 0.2 10*3/uL — AB (ref 0–0.1)
Basophils Relative: 4 %
EOS PCT: 3 %
Eosinophils Absolute: 0.2 10*3/uL (ref 0–0.7)
HEMATOCRIT: 29.9 % — AB (ref 35.0–47.0)
Hemoglobin: 9.7 g/dL — ABNORMAL LOW (ref 12.0–16.0)
LYMPHS ABS: 0.5 10*3/uL — AB (ref 1.0–3.6)
LYMPHS PCT: 11 %
MCH: 27.2 pg (ref 26.0–34.0)
MCHC: 32.3 g/dL (ref 32.0–36.0)
MCV: 84 fL (ref 80.0–100.0)
MONO ABS: 0.2 10*3/uL (ref 0.2–0.9)
Monocytes Relative: 5 %
NEUTROS ABS: 3.5 10*3/uL (ref 1.4–6.5)
Neutrophils Relative %: 77 %
Platelets: 213 10*3/uL (ref 150–440)
RBC: 3.55 MIL/uL — AB (ref 3.80–5.20)
RDW: 17.1 % — AB (ref 11.5–14.5)
WBC: 4.6 10*3/uL (ref 3.6–11.0)

## 2016-12-07 LAB — COMPREHENSIVE METABOLIC PANEL
ALT: 19 U/L (ref 14–54)
AST: 32 U/L (ref 15–41)
Albumin: 3.4 g/dL — ABNORMAL LOW (ref 3.5–5.0)
Alkaline Phosphatase: 88 U/L (ref 38–126)
Anion gap: 9 (ref 5–15)
BILIRUBIN TOTAL: 0.3 mg/dL (ref 0.3–1.2)
BUN: 16 mg/dL (ref 6–20)
CALCIUM: 8.9 mg/dL (ref 8.9–10.3)
CO2: 24 mmol/L (ref 22–32)
CREATININE: 0.78 mg/dL (ref 0.44–1.00)
Chloride: 102 mmol/L (ref 101–111)
Glucose, Bld: 184 mg/dL — ABNORMAL HIGH (ref 65–99)
Potassium: 4 mmol/L (ref 3.5–5.1)
Sodium: 135 mmol/L (ref 135–145)
TOTAL PROTEIN: 7.3 g/dL (ref 6.5–8.1)

## 2016-12-07 LAB — MAGNESIUM: MAGNESIUM: 1.4 mg/dL — AB (ref 1.7–2.4)

## 2016-12-07 MED ORDER — AMITRIPTYLINE HCL 50 MG PO TABS: 50.0000 mg | ORAL_TABLET | Freq: Every day | ORAL | 3 refills | Status: DC

## 2016-12-07 MED ORDER — HEPARIN SOD (PORK) LOCK FLUSH 100 UNIT/ML IV SOLN
500.0000 [IU] | INTRAVENOUS | Status: AC | PRN
  Administered 2016-12-07: 500 [IU]

## 2016-12-07 MED ORDER — GABAPENTIN 300 MG PO CAPS: 600.0000 mg | ORAL_CAPSULE | Freq: Three times a day (TID) | ORAL | 6 refills | Status: DC

## 2016-12-07 MED ORDER — SODIUM CHLORIDE 0.9% FLUSH
10.0000 mL | INTRAVENOUS | Status: AC | PRN
  Administered 2016-12-07: 10 mL
  Filled 2016-12-07: qty 10

## 2016-12-07 NOTE — Progress Notes (Signed)
Here for follow up. Saw dr Posey Pronto /neuropgy for neuropathy-no med changes made.

## 2016-12-07 NOTE — Addendum Note (Signed)
Addended by: Sandria Bales B on: 12/07/2016 02:30 PM   Modules accepted: Orders

## 2016-12-07 NOTE — Progress Notes (Signed)
[place] OFFICE PROGRESS NOTE  Patient Care Team: Tower, [name], MD as PCP - General Byrnett, [name], MD (General Surgery) [name], MD (Oncology)  Cancer Staging No matching staging information was found for the patient.    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 5 CM TUMOR MASS BIOPSIES POSITIVE FOR INVASIVE MAMMARY CARCINOMA TRIPLE NEGATIVE DISEASE(jANUARY, 2017)stage IIIa. ER/PR/her2 Neu-NEGATIVE  # Cytoxan and Adriamycin from March 18, 2015; carbo-taxol x4 cycles  # AUg 18th s/p Lumpec & SLNBx- Complete path CR [ypT0 ypsn0] s/p RT [oct 2017; Dr.Crystal]  3. MUGA scan of the heart shows ejection fraction to be 61% (January, 2017) 4     Carcinoma of upper-outer quadrant of left breast in female, estrogen receptor negative ([place])   11/21/2015 Initial Diagnosis    Carcinoma of upper-outer quadrant of left breast in female, estrogen receptor negative ([place])     INTERVAL HISTORY:  Suzanne Strong 71 y.o.  female pleasant patient above history of stage IIIa breast cancer triple negative- Is here for follow-up.  In the interim patient has been evaluated by neurology-for her neuropathy. Tingling and numbness in the feet is stable. She continues to take Neurontin 600 mg 3 times a day and also on amitriptyline.  Swelling in the legs has improved since being on compression stockings.  She is awaiting a referral to a hand specialist for possible carpal tunnel in her right upper extremity. Otherwise no bone pain. No headaches. No chest pain or shortness of breath or cough.  REVIEW OF SYSTEMS:  A complete 10 point review of system is done which is negative except mentioned above/history of present illness.   PAST MEDICAL HISTORY :  Past Medical  History:  Diagnosis Date  . Anemia    iron deficiency and B12 deficiency  . Anxiety   . Breast cancer ([place]) 02/25/2015   upper-outer quadrant of left female breast, Triple Negative. Neo-adjuvant chemo with complete pathologic response.   . Cervical dysplasia    conization  . Diabetes mellitus without complication ([place])   . Diverticulosis   . Fever 04/11/2015  . Gastroparesis    related to previous radiation tx  . GERD (gastroesophageal reflux disease)   . Hyperlipidemia   . Hypertension   . Shingles    Hx of  . 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 APPENDECTOMY    . BREAST BIOPSY Left 02/25/2015   INVASIVE MAMMARY CARCINOMA,Triple negative.  [name] BREAST CYST ASPIRATION    . BREAST EXCISIONAL BIOPSY Left 12/2015   surgery  . BREAST LUMPECTOMY WITH NEEDLE LOCALIZATION Left 10/02/2015   Procedure: BREAST LUMPECTOMY WITH NEEDLE LOCALIZATION;  Surgeon: [name], MD;  Location: [facility] ORS;  Service: General;  Laterality: Left;  . BREAST LUMPECTOMY WITH SENTINEL LYMPH NODE BIOPSY Left 10/02/2015   Procedure: LEFT BREAST WIDE EXCISION WITH SENTINEL LYMPH NODE BX;  Surgeon: [name], MD;  Location: [facility] ORS;  Service: General;  Laterality: Left;  . BREAST SURGERY     breast biopsy benign  . CHOLECYSTECTOMY    . Cologuard  07/22/2016   Negative  . ESOPHAGOGASTRODUODENOSCOPY  07/2009   normal with few gastric polyps  . MOLE REMOVAL    . PORT-A-CATH  REMOVAL    . PORTACATH PLACEMENT    . PORTACATH PLACEMENT Right 03/12/2015   Procedure: INSERTION PORT-A-CATH;  Surgeon: [name], MD;  Location: [facility] ORS;  Service: General;  Laterality: Right;  . RADICAL NECK DISSECTION    . TONSILLECTOMY     cancer treated with chemo and radiation    FAMILY HISTORY :   Family History  Problem Relation Age of Onset  . Osteoporosis Mother   . Hyperlipidemia Mother   . Depression Mother   . Cancer Mother         skin cancer ? basal cell and lung ca heavy smoker  . Alcohol abuse Father   . Cancer Father        skin CA ? basal cell  . Heart disease Father        CHF  . Hyperlipidemia Brother   . Hypertension Brother     SOCIAL HISTORY:   Social History  Substance Use Topics  . Smoking status: Former Smoker    Packs/day: 0.50    Years: 6.00    Types: Cigarettes    Quit date: 03/10/1984  . Smokeless tobacco: Never Used  . Alcohol use No    ALLERGIES:  is allergic to amoxicillin and fluconazole.  MEDICATIONS:  Current Outpatient Prescriptions  Medication Sig Dispense Refill  . ALPRAZolam (XANAX) 0.5 MG tablet Take 1 tablet (0.5 mg total) by mouth at bedtime as needed for sleep. 30 tablet 3  . amitriptyline (ELAVIL) 50 MG tablet Take 1 tablet (50 mg total) by mouth at bedtime. 90 tablet 3  . atorvastatin (LIPITOR) 10 MG tablet Take 1 tablet (10 mg total) by mouth daily. 30 tablet 11  . b complex vitamins tablet Take 1 tablet by mouth daily.      . cyanocobalamin (,VITAMIN B-12,) 1000 MCG/ML injection Inject 1,000 mcg into the muscle every 30 (thirty) days.      [name] gabapentin (NEURONTIN) 300 MG capsule Take 2 capsules (600 mg total) by mouth 3 (three) times daily. 180 capsule 6  . glipiZIDE (GLUCOTROL) 10 MG tablet Take 10 mg by mouth daily before breakfast.     . JANUMET XR 50-1000 MG TB24 Take 1 tablet by mouth 2 (two) times daily.   0  . lisinopril (PRINIVIL,ZESTRIL) 5 MG tablet Take 1 tablet (5 mg total) by mouth daily. 30 tablet 11  . magnesium chloride (SLOW-MAG) 64 MG TBEC SR tablet Take by mouth.    . mirtazapine (REMERON) 30 MG tablet take 2 tablet by mouth at bedtime  0  . omeprazole (PRILOSEC) 20 MG capsule Take 1 capsule (20 mg total) by mouth daily. 30 capsule 11  . rOPINIRole (REQUIP) 1 MG tablet take 1 tablet by mouth every morning if needed and 3 tablets at bedtime 120 tablet 11  . sertraline (ZOLOFT) 100 MG tablet Take 200 mg by mouth daily.  0  . spironolactone  (ALDACTONE) 25 MG tablet take 1 tablet by mouth once daily if needed (Patient taking differently: take 1/2 tablet per day) 30 tablet 5  . vitamin B-12 (CYANOCOBALAMIN) 1000 MCG tablet Take by mouth.    . lidocaine-prilocaine (EMLA) cream Apply 1 application topically as needed. Apply to port when going through Chemo    . potassium chloride (MICRO-K) 10 MEQ CR capsule Take by mouth.     No current facility-administered medications for this visit.    Facility-Administered Medications Ordered in Other Visits  Medication Dose Route Frequency Provider Last Rate Last Dose  .  sodium chloride flush (NS) 0.9 % injection 10 mL  10 mL Intravenous PRN Cammie Sickle, MD   10 mL at 09/11/15 0845    PHYSICAL EXAMINATION: ECOG PERFORMANCE STATUS: 0 - Asymptomatic  BP 136/68 (BP Location: Left Arm, Patient Position: Sitting)   Pulse 93   Temp (!) 96.9 F (36.1 C) (Tympanic)   Resp 18   Wt 226 lb 12.8 oz (102.9 kg)   BMI 36.61 kg/m   Filed Weights   12/07/16 1333  Weight: 226 lb 12.8 oz (102.9 kg)    GENERAL: Well-nourished well-developed; Alert, no distress and comfortable.   Alone. EYES: no pallor or icterus OROPHARYNX: no thrush or ulceration; good dentition  NECK: supple, no masses felt LYMPH:  no palpable lymphadenopathy in the cervical, axillary or inguinal regions LUNGS: clear to auscultation and  No wheeze or crackles HEART/CVS: regular rate & rhythm and no murmurs; grade 1bilateral lower extremity edema ABDOMEN:abdomen soft, non-tender and normal bowel sounds Musculoskeletal:no cyanosis of digits and no clubbing  PSYCH: alert & oriented x 3 with fluent speech NEURO: no focal motor/sensory deficits SKIN:  no rashes or significant lesions  LABORATORY DATA:  I have reviewed the data as listed    Component Value Date/Time   NA 135 12/07/2016 1300   NA 142 03/01/2014 0846   K 4.0 12/07/2016 1300   K 4.3 03/01/2014 0846   CL 102 12/07/2016 1300   CL 102 03/01/2014 0846    CO2 24 12/07/2016 1300   CO2 28 03/01/2014 0846   GLUCOSE 184 (H) 12/07/2016 1300   GLUCOSE 219 (H) 03/01/2014 0846   BUN 16 12/07/2016 1300   BUN 12 03/01/2014 0846   CREATININE 0.78 12/07/2016 1300   CREATININE 0.95 03/01/2014 0846   CALCIUM 8.9 12/07/2016 1300   CALCIUM 8.6 03/01/2014 0846   PROT 7.3 12/07/2016 1300   PROT 7.5 03/01/2014 0846   ALBUMIN 3.4 (L) 12/07/2016 1300   ALBUMIN 3.4 03/01/2014 0846   AST 32 12/07/2016 1300   AST 20 03/01/2014 0846   ALT 19 12/07/2016 1300   ALT 28 03/01/2014 0846   ALKPHOS 88 12/07/2016 1300   ALKPHOS 122 (H) 03/01/2014 0846   BILITOT 0.3 12/07/2016 1300   BILITOT 0.2 03/01/2014 0846   GFRNONAA >60 12/07/2016 1300   GFRNONAA >60 03/01/2014 0846   GFRNONAA >60 08/24/2013 1015   GFRAA >60 12/07/2016 1300   GFRAA >60 03/01/2014 0846   GFRAA >60 08/24/2013 1015    No results found for: SPEP, UPEP  Lab Results  Component Value Date   WBC 4.6 12/07/2016   NEUTROABS 3.5 12/07/2016   HGB 9.7 (L) 12/07/2016   HCT 29.9 (L) 12/07/2016   MCV 84.0 12/07/2016   PLT 213 12/07/2016      Chemistry      Component Value Date/Time   NA 135 12/07/2016 1300   NA 142 03/01/2014 0846   K 4.0 12/07/2016 1300   K 4.3 03/01/2014 0846   CL 102 12/07/2016 1300   CL 102 03/01/2014 0846   CO2 24 12/07/2016 1300   CO2 28 03/01/2014 0846   BUN 16 12/07/2016 1300   BUN 12 03/01/2014 0846   CREATININE 0.78 12/07/2016 1300   CREATININE 0.95 03/01/2014 0846      Component Value Date/Time   CALCIUM 8.9 12/07/2016 1300   CALCIUM 8.6 03/01/2014 0846   ALKPHOS 88 12/07/2016 1300   ALKPHOS 122 (H) 03/01/2014 0846   AST 32 12/07/2016 1300   AST 20 03/01/2014  0846   ALT 19 12/07/2016 1300   ALT 28 03/01/2014 0846   BILITOT 0.3 12/07/2016 1300   BILITOT 0.2 03/01/2014 0846       RADIOGRAPHIC STUDIES: I have personally reviewed the radiological images as listed and agreed with the findings in the report. No results found.   ASSESSMENT & PLAN:   Carcinoma of upper-outer quadrant of left breast in female, estrogen receptor negative ([place]) Left breast cancer stage III  ER/PR/her 2 Neu NEG. s/p neoadjuvant chemotherapy; S/p Surgery- complete pathologic response. No clinical evidence of progression or recurrence.  # PN- G-2  on 600 TID; amitriptyline 50 mg daily at bedtime.  # Mild anemia secondary to bone marrow suppression from previous chemotherapy. Hemoglobin 9.7; cologard-Normal. Monitor for now.  # Hypomagnesemia- 1.5;  home supplementation;  STOP k supp at home- K was 4 today.   # follow up in 8 weeks port flush/ # Follow up in 4 months/labs/port flush.    No orders of the defined types were placed in this encounter.     Cammie Sickle, MD 12/07/2016 2:06 PM

## 2016-12-07 NOTE — Assessment & Plan Note (Addendum)
Left breast cancer stage III  ER/PR/her 2 Neu NEG. s/p neoadjuvant chemotherapy; S/p Surgery- complete pathologic response. No clinical evidence of progression or recurrence.  # PN- G-2  on 600 TID; amitriptyline 50 mg daily at bedtime.  # Mild anemia secondary to bone marrow suppression from previous chemotherapy. Hemoglobin 9.7; cologard-Normal. Monitor for now.  # Hypomagnesemia- 1.5;  home supplementation;  STOP k supp at home- K was 4 today.   # follow up in 8 weeks port flush/ # Follow up in 4 months/labs/port flush.

## 2016-12-08 LAB — CANCER ANTIGEN 27.29: CAN 27.29: 7.5 U/mL (ref 0.0–38.6)

## 2016-12-09 DIAGNOSIS — E119 Type 2 diabetes mellitus without complications: Secondary | ICD-10-CM | POA: Diagnosis not present

## 2016-12-09 LAB — HEMOGLOBIN A1C: Hemoglobin A1C: 7.3

## 2016-12-15 DIAGNOSIS — G620 Drug-induced polyneuropathy: Secondary | ICD-10-CM | POA: Diagnosis not present

## 2016-12-15 DIAGNOSIS — E119 Type 2 diabetes mellitus without complications: Secondary | ICD-10-CM | POA: Diagnosis not present

## 2017-01-18 ENCOUNTER — Encounter: Payer: Self-pay | Admitting: Internal Medicine

## 2017-01-18 ENCOUNTER — Ambulatory Visit (INDEPENDENT_AMBULATORY_CARE_PROVIDER_SITE_OTHER): Payer: Medicare Other | Admitting: Internal Medicine

## 2017-01-18 VITALS — BP 134/72 | HR 91 | Temp 97.7°F | Resp 24 | Wt 226.0 lb

## 2017-01-18 DIAGNOSIS — J22 Unspecified acute lower respiratory infection: Secondary | ICD-10-CM | POA: Insufficient documentation

## 2017-01-18 MED ORDER — HYDROCODONE-HOMATROPINE 5-1.5 MG/5ML PO SYRP: 5.0000 mL | ORAL_SOLUTION | Freq: Every evening | ORAL | 0 refills | Status: DC | PRN

## 2017-01-18 MED ORDER — AZITHROMYCIN 250 MG PO TABS: ORAL_TABLET | ORAL | 0 refills | Status: DC

## 2017-01-18 NOTE — Progress Notes (Signed)
Subjective:    Patient ID: Suzanne Strong, female    DOB: [birth year], 71 y.o.   MRN: [id number]  HPI Here due to cough  Started about a week ago Worst at night Less sputum now-- yellow green and thick No fever now--did have the first couple of days No sweats but gets cold at night Some SOB--with exertion  Only taking tylenol---may help some  No ear pain No head congestion No clear sore throat--- hard to tell since surgery for tonsillar cancer  Current Outpatient Medications on File Prior to Visit  Medication Sig Dispense Refill  . ALPRAZolam (XANAX) 0.5 MG tablet Take 1 tablet (0.5 mg total) by mouth at bedtime as needed for sleep. 30 tablet 3  . amitriptyline (ELAVIL) 50 MG tablet Take 1 tablet (50 mg total) by mouth at bedtime. 90 tablet 3  . atorvastatin (LIPITOR) 10 MG tablet Take 1 tablet (10 mg total) by mouth daily. 30 tablet 11  . b complex vitamins tablet Take 1 tablet by mouth daily.      . cyanocobalamin (,VITAMIN B-12,) 1000 MCG/ML injection Inject 1,000 mcg into the muscle every 30 (thirty) days.      [name] gabapentin (NEURONTIN) 300 MG capsule Take 2 capsules (600 mg total) by mouth 3 (three) times daily. 180 capsule 6  . glipiZIDE (GLUCOTROL) 10 MG tablet Take 10 mg by mouth daily before breakfast.     . JANUMET XR 50-1000 MG TB24 Take 1 tablet by mouth 2 (two) times daily.   0  . lidocaine-prilocaine (EMLA) cream Apply 1 application topically as needed. Apply to port when going through Chemo    . lisinopril (PRINIVIL,ZESTRIL) 5 MG tablet Take 1 tablet (5 mg total) by mouth daily. 30 tablet 11  . magnesium chloride (SLOW-MAG) 64 MG TBEC SR tablet Take by mouth.    . mirtazapine (REMERON) 30 MG tablet take 2 tablet by mouth at bedtime  0  . omeprazole (PRILOSEC) 20 MG capsule Take 1 capsule (20 mg total) by mouth daily. 30 capsule 11  . potassium chloride (MICRO-K) 10 MEQ CR capsule Take by mouth.    [name] rOPINIRole (REQUIP) 1 MG tablet take 1 tablet by mouth every  morning if needed and 3 tablets at bedtime 120 tablet 11  . sertraline (ZOLOFT) 100 MG tablet Take 200 mg by mouth daily.  0  . spironolactone (ALDACTONE) 25 MG tablet take 1 tablet by mouth once daily if needed (Patient taking differently: take 1/2 tablet per day) 30 tablet 5  . vitamin B-12 (CYANOCOBALAMIN) 1000 MCG tablet Take by mouth.     Current Facility-Administered Medications on File Prior to Visit  Medication Dose Route Frequency Provider Last Rate Last Dose  . sodium chloride flush (NS) 0.9 % injection 10 mL  10 mL Intravenous PRN Cammie Sickle, MD   10 mL at 09/11/15 0845    Allergies  Allergen Reactions  . Amoxicillin     REACTION: rash  . Fluconazole     REACTION: hives    Past Medical History:  Diagnosis Date  . Anemia    iron deficiency and B12 deficiency  . Anxiety   . Breast cancer ([place]) 02/25/2015   upper-outer quadrant of left female breast, Triple Negative. Neo-adjuvant chemo with complete pathologic response.   . Cervical dysplasia    conization  . Diabetes mellitus without complication ([place])   . Diverticulosis   . Fever 04/11/2015  . Gastroparesis    related to previous radiation tx  .  GERD (gastroesophageal reflux disease)   . Hyperlipidemia   . Hypertension   . Shingles    Hx of  . 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 APPENDECTOMY    . BREAST BIOPSY Left 02/25/2015   INVASIVE MAMMARY CARCINOMA,Triple negative.  [name] BREAST CYST ASPIRATION    . BREAST EXCISIONAL BIOPSY Left 12/2015   surgery  . BREAST LUMPECTOMY WITH NEEDLE LOCALIZATION Left 10/02/2015   Procedure: BREAST LUMPECTOMY WITH NEEDLE LOCALIZATION;  Surgeon: [name], MD;  Location: [facility] ORS;  Service: General;  Laterality: Left;  . BREAST LUMPECTOMY WITH SENTINEL LYMPH NODE BIOPSY Left 10/02/2015   Procedure: LEFT BREAST WIDE EXCISION WITH SENTINEL LYMPH NODE BX;  Surgeon: [name], MD;  Location: [facility] ORS;   Service: General;  Laterality: Left;  . BREAST SURGERY     breast biopsy benign  . CHOLECYSTECTOMY    . Cologuard  07/22/2016   Negative  . ESOPHAGOGASTRODUODENOSCOPY  07/2009   normal with few gastric polyps  . MOLE REMOVAL    . PORT-A-CATH REMOVAL    . PORTACATH PLACEMENT    . PORTACATH PLACEMENT Right 03/12/2015   Procedure: INSERTION PORT-A-CATH;  Surgeon: [name], MD;  Location: [facility] ORS;  Service: General;  Laterality: Right;  . RADICAL NECK DISSECTION    . TONSILLECTOMY     cancer treated with chemo and radiation    Family History  Problem Relation Age of Onset  . Osteoporosis Mother   . Hyperlipidemia Mother   . Depression Mother   . Cancer Mother        skin cancer ? basal cell and lung ca heavy smoker  . Alcohol abuse Father   . Cancer Father        skin CA ? basal cell  . Heart disease Father        CHF  . Hyperlipidemia Brother   . Hypertension Brother     Social History   Socioeconomic History  . Marital status: Married    Spouse name: Not on file  . Number of children: 0  . Years of education: 85  . Highest education level: Not on file  Social Needs  . Financial resource strain: Not on file  . Food insecurity - worry: Not on file  . Food insecurity - inability: Not on file  . Transportation needs - medical: Not on file  . Transportation needs - non-medical: Not on file  Occupational History  . Occupation: Clinical cytogeneticist  Tobacco Use  . Smoking status: Former Smoker    Packs/day: 0.50    Years: 6.00    Pack years: 3.00    Types: Cigarettes    Last attempt to quit: 03/10/1984    Years since quitting: 32.8  . Smokeless tobacco: Never Used  Substance and Sexual Activity  . Alcohol use: No    Alcohol/week: 0.0 oz  . Drug use: No  . Sexual activity: Not on file  Other Topics Concern  . Not on file  Social History Narrative   Lives with husband in a one story home.  No children.     Works as Radiation protection practitioner for [employer].      Education: college.    Review of Systems  No rash No vomiting Some diarrhea--1 loose stool daily Appetite is okay     Objective:   Physical Exam  Constitutional: No distress.  HENT:  Mouth/Throat: Oropharynx is clear and moist. No oropharyngeal  exudate.  Cerumen blocking right TM. Left is normal Mild nasal congestion No sinus tenderness  Neck: No thyromegaly present.  Pulmonary/Chest: Effort normal and breath sounds normal. No respiratory distress. She has no wheezes.  Faint RLL crackles  Lymphadenopathy:    She has no cervical adenopathy.          Assessment & Plan:

## 2017-01-18 NOTE — Assessment & Plan Note (Signed)
tachypneic and RLL crackles No x-ray tech now (5:25)--not sick enough for ER (looks okay) Will treat empirically with z-pak Check CXR in AM

## 2017-01-19 ENCOUNTER — Ambulatory Visit (INDEPENDENT_AMBULATORY_CARE_PROVIDER_SITE_OTHER)
Admission: RE | Admit: 2017-01-19 | Discharge: 2017-01-19 | Disposition: A | Discharge status: Home / Self Care | Payer: Medicare Other | Source: Ambulatory Visit | Attending: Internal Medicine | Admitting: Internal Medicine

## 2017-01-19 DIAGNOSIS — J22 Unspecified acute lower respiratory infection: Secondary | ICD-10-CM

## 2017-01-19 DIAGNOSIS — R0989 Other specified symptoms and signs involving the circulatory and respiratory systems: Secondary | ICD-10-CM | POA: Diagnosis not present

## 2017-01-20 DIAGNOSIS — F411 Generalized anxiety disorder: Secondary | ICD-10-CM | POA: Diagnosis not present

## 2017-01-26 ENCOUNTER — Other Ambulatory Visit: Payer: Self-pay | Admitting: Family Medicine

## 2017-01-28 ENCOUNTER — Other Ambulatory Visit: Payer: Self-pay

## 2017-01-28 DIAGNOSIS — C50412 Malignant neoplasm of upper-outer quadrant of left female breast: Secondary | ICD-10-CM

## 2017-01-28 DIAGNOSIS — Z171 Estrogen receptor negative status [ER-]: Principal | ICD-10-CM

## 2017-01-28 NOTE — Telephone Encounter (Signed)
Will refill electronically  

## 2017-01-28 NOTE — Telephone Encounter (Signed)
Medication is not on pts current med list. Last OV 01/2017-acute

## 2017-02-01 ENCOUNTER — Inpatient Hospital Stay: Payer: Medicare Other | Attending: Internal Medicine

## 2017-02-01 DIAGNOSIS — Z171 Estrogen receptor negative status [ER-]: Secondary | ICD-10-CM | POA: Diagnosis not present

## 2017-02-01 DIAGNOSIS — Z95828 Presence of other vascular implants and grafts: Secondary | ICD-10-CM

## 2017-02-01 DIAGNOSIS — C50412 Malignant neoplasm of upper-outer quadrant of left female breast: Secondary | ICD-10-CM | POA: Diagnosis not present

## 2017-02-01 DIAGNOSIS — Z452 Encounter for adjustment and management of vascular access device: Secondary | ICD-10-CM | POA: Diagnosis not present

## 2017-02-01 DIAGNOSIS — Z9221 Personal history of antineoplastic chemotherapy: Secondary | ICD-10-CM | POA: Insufficient documentation

## 2017-02-01 MED ORDER — SODIUM CHLORIDE 0.9% FLUSH
10.0000 mL | INTRAVENOUS | Status: DC | PRN
  Administered 2017-02-01: 10 mL via INTRAVENOUS
  Filled 2017-02-01: qty 10

## 2017-02-01 MED ORDER — HEPARIN SOD (PORK) LOCK FLUSH 100 UNIT/ML IV SOLN
500.0000 [IU] | Freq: Once | INTRAVENOUS | Status: AC
  Administered 2017-02-01: 500 [IU] via INTRAVENOUS

## 2017-02-25 ENCOUNTER — Encounter: Payer: Self-pay | Admitting: *Deleted

## 2017-02-25 ENCOUNTER — Other Ambulatory Visit: Payer: Self-pay | Admitting: Family Medicine

## 2017-03-15 ENCOUNTER — Ambulatory Visit: Payer: Medicare Other | Admitting: Radiation Oncology

## 2017-03-29 ENCOUNTER — Inpatient Hospital Stay: Payer: Medicare Other | Admitting: Internal Medicine

## 2017-03-29 ENCOUNTER — Ambulatory Visit: Payer: Medicare Other | Admitting: Radiation Oncology

## 2017-03-29 ENCOUNTER — Inpatient Hospital Stay: Payer: Medicare Other

## 2017-03-29 NOTE — Assessment & Plan Note (Deleted)
Left breast cancer stage III  ER/PR/her 2 Neu NEG. s/p neoadjuvant chemotherapy; S/p Surgery- complete pathologic response. No clinical evidence of progression or recurrence.  # PN- G-2  on 600 TID; amitriptyline 50 mg daily at bedtime.  # Mild anemia secondary to bone marrow suppression from previous chemotherapy. Hemoglobin 9.7; cologard-Normal. Monitor for now.  # Hypomagnesemia- 1.5;  home supplementation;  STOP k supp at home- K was 4 today.   # follow up in 8 weeks port flush/ # Follow up in 4 months/labs/port flush.

## 2017-03-29 NOTE — Progress Notes (Deleted)
[place] OFFICE PROGRESS NOTE  Patient Care Team: Tower, [name], MD as PCP - General Byrnett, [name], MD (General Surgery) [name], MD (Oncology)  Cancer Staging No matching staging information was found for the patient.    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 5 CM TUMOR MASS BIOPSIES POSITIVE FOR INVASIVE MAMMARY CARCINOMA TRIPLE NEGATIVE DISEASE(jANUARY, 2017)stage IIIa. ER/PR/her2 Neu-NEGATIVE  # Cytoxan and Adriamycin from March 18, 2015; carbo-taxol x4 cycles  # AUg 18th s/p Lumpec & SLNBx- Complete path CR [ypT0 ypsn0] s/p RT [oct 2017; Dr.Crystal]  3. MUGA scan of the heart shows ejection fraction to be 61% (January, 2017) 4     Carcinoma of upper-outer quadrant of left breast in female, estrogen receptor negative ([place])   11/21/2015 Initial Diagnosis    Carcinoma of upper-outer quadrant of left breast in female, estrogen receptor negative ([place])      INTERVAL HISTORY:  Suzanne Strong 72 y.o.  female pleasant patient above history of stage IIIa breast cancer triple negative- Is here for follow-up.  In the interim patient has been evaluated by neurology-for her neuropathy. Tingling and numbness in the feet is stable. She continues to take Neurontin 600 mg 3 times a day and also on amitriptyline.  Swelling in the legs has improved since being on compression stockings.  She is awaiting a referral to a hand specialist for possible carpal tunnel in her right upper extremity. Otherwise no bone pain. No headaches. No chest pain or shortness of breath or cough.  REVIEW OF SYSTEMS:  A complete 10 point review of system is done which is negative except mentioned above/history of present illness.   PAST MEDICAL HISTORY :  Past  Medical History:  Diagnosis Date  . Anemia    iron deficiency and B12 deficiency  . Anxiety   . Breast cancer ([place]) 02/25/2015   upper-outer quadrant of left female breast, Triple Negative. Neo-adjuvant chemo with complete pathologic response.   . Cervical dysplasia    conization  . Diabetes mellitus without complication ([place])   . Diverticulosis   . Fever 04/11/2015  . Gastroparesis    related to previous radiation tx  . GERD (gastroesophageal reflux disease)   . Hyperlipidemia   . Hypertension   . Shingles    Hx of  . 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 APPENDECTOMY    . BREAST BIOPSY Left 02/25/2015   INVASIVE MAMMARY CARCINOMA,Triple negative.  [name] BREAST CYST ASPIRATION    . BREAST EXCISIONAL BIOPSY Left 12/2015   surgery  . BREAST LUMPECTOMY WITH NEEDLE LOCALIZATION Left 10/02/2015   Procedure: BREAST LUMPECTOMY WITH NEEDLE LOCALIZATION;  Surgeon: [name], MD;  Location: [facility] ORS;  Service: General;  Laterality: Left;  . BREAST LUMPECTOMY WITH SENTINEL LYMPH NODE BIOPSY Left 10/02/2015   Procedure: LEFT BREAST WIDE EXCISION WITH SENTINEL LYMPH NODE BX;  Surgeon: [name], MD;  Location: [facility] ORS;  Service: General;  Laterality: Left;  . BREAST SURGERY     breast biopsy benign  . CHOLECYSTECTOMY    . Cologuard  07/22/2016   Negative  . ESOPHAGOGASTRODUODENOSCOPY  07/2009   normal with few gastric polyps  . MOLE REMOVAL    .  PORT-A-CATH REMOVAL    . PORTACATH PLACEMENT    . PORTACATH PLACEMENT Right 03/12/2015   Procedure: INSERTION PORT-A-CATH;  Surgeon: [name], MD;  Location: [facility] ORS;  Service: General;  Laterality: Right;  . RADICAL NECK DISSECTION    . TONSILLECTOMY     cancer treated with chemo and radiation    FAMILY HISTORY :   Family History  Problem Relation Age of Onset  . Osteoporosis Mother   . Hyperlipidemia Mother   . Depression Mother   .  Cancer Mother        skin cancer ? basal cell and lung ca heavy smoker  . Alcohol abuse Father   . Cancer Father        skin CA ? basal cell  . Heart disease Father        CHF  . Hyperlipidemia Brother   . Hypertension Brother     SOCIAL HISTORY:   Social History   Tobacco Use  . Smoking status: Former Smoker    Packs/day: 0.50    Years: 6.00    Pack years: 3.00    Types: Cigarettes    Last attempt to quit: 03/10/1984    Years since quitting: 33.0  . Smokeless tobacco: Never Used  Substance Use Topics  . Alcohol use: No    Alcohol/week: 0.0 oz  . Drug use: No    ALLERGIES:  is allergic to amoxicillin and fluconazole.  MEDICATIONS:  Current Outpatient Medications  Medication Sig Dispense Refill  . ALPRAZolam (XANAX) 0.5 MG tablet Take 1 tablet (0.5 mg total) by mouth at bedtime as needed for sleep. 30 tablet 3  . amitriptyline (ELAVIL) 50 MG tablet Take 1 tablet (50 mg total) by mouth at bedtime. 90 tablet 3  . atorvastatin (LIPITOR) 10 MG tablet Take 1 tablet (10 mg total) by mouth daily. 30 tablet 11  . azithromycin (ZITHROMAX Z-PAK) 250 MG tablet Take 2 tablets (500 mg) on  Day 1,  followed by 1 tablet (250 mg) once daily on Days 2 through 5. 6 each 0  . b complex vitamins tablet Take 1 tablet by mouth daily.      . cyanocobalamin (,VITAMIN B-12,) 1000 MCG/ML injection Inject 1,000 mcg into the muscle every 30 (thirty) days.      . fluticasone (FLONASE) 50 MCG/ACT nasal spray instill 2 sprays into each nostril once daily 16 g 11  . gabapentin (NEURONTIN) 300 MG capsule Take 2 capsules (600 mg total) by mouth 3 (three) times daily. 180 capsule 6  . glipiZIDE (GLUCOTROL) 10 MG tablet Take 10 mg by mouth daily before breakfast.     . HYDROcodone-homatropine (HYCODAN) 5-1.5 MG/5ML syrup Take 5 mLs by mouth at bedtime as needed for cough. 120 mL 0  . JANUMET XR 50-1000 MG TB24 Take 1 tablet by mouth 2 (two) times daily.   0  . lidocaine-prilocaine (EMLA) cream Apply 1  application topically as needed. Apply to port when going through Chemo    . lisinopril (PRINIVIL,ZESTRIL) 5 MG tablet Take 1 tablet (5 mg total) by mouth daily. 30 tablet 11  . magnesium chloride (SLOW-MAG) 64 MG TBEC SR tablet Take by mouth.    . mirtazapine (REMERON) 30 MG tablet take 2 tablet by mouth at bedtime  0  . omeprazole (PRILOSEC) 20 MG capsule Take 1 capsule (20 mg total) by mouth daily. 30 capsule 11  . potassium chloride (MICRO-K) 10 MEQ CR capsule Take by mouth.    [name] rOPINIRole (REQUIP)  1 MG tablet take 1 tablet by mouth every morning if needed and 3 tablets at bedtime 120 tablet 11  . sertraline (ZOLOFT) 100 MG tablet Take 200 mg by mouth daily.  0  . spironolactone (ALDACTONE) 25 MG tablet take 1 tablet by mouth once daily if needed 30 tablet 4  . vitamin B-12 (CYANOCOBALAMIN) 1000 MCG tablet Take by mouth.     No current facility-administered medications for this visit.    Facility-Administered Medications Ordered in Other Visits  Medication Dose Route Frequency Provider Last Rate Last Dose  . sodium chloride flush (NS) 0.9 % injection 10 mL  10 mL Intravenous PRN Cammie Sickle, MD   10 mL at 09/11/15 0845    PHYSICAL EXAMINATION: ECOG PERFORMANCE STATUS: 0 - Asymptomatic  There were no vitals taken for this visit.  There were no vitals filed for this visit.  GENERAL: Well-nourished well-developed; Alert, no distress and comfortable.   Alone. EYES: no pallor or icterus OROPHARYNX: no thrush or ulceration; good dentition  NECK: supple, no masses felt LYMPH:  no palpable lymphadenopathy in the cervical, axillary or inguinal regions LUNGS: clear to auscultation and  No wheeze or crackles HEART/CVS: regular rate & rhythm and no murmurs; grade 1bilateral lower extremity edema ABDOMEN:abdomen soft, non-tender and normal bowel sounds Musculoskeletal:no cyanosis of digits and no clubbing  PSYCH: alert & oriented x 3 with fluent speech NEURO: no focal  motor/sensory deficits SKIN:  no rashes or significant lesions  LABORATORY DATA:  I have reviewed the data as listed    Component Value Date/Time   NA 135 12/07/2016 1300   NA 142 03/01/2014 0846   K 4.0 12/07/2016 1300   K 4.3 03/01/2014 0846   CL 102 12/07/2016 1300   CL 102 03/01/2014 0846   CO2 24 12/07/2016 1300   CO2 28 03/01/2014 0846   GLUCOSE 184 (H) 12/07/2016 1300   GLUCOSE 219 (H) 03/01/2014 0846   BUN 16 12/07/2016 1300   BUN 12 03/01/2014 0846   CREATININE 0.78 12/07/2016 1300   CREATININE 0.95 03/01/2014 0846   CALCIUM 8.9 12/07/2016 1300   CALCIUM 8.6 03/01/2014 0846   PROT 7.3 12/07/2016 1300   PROT 7.5 03/01/2014 0846   ALBUMIN 3.4 (L) 12/07/2016 1300   ALBUMIN 3.4 03/01/2014 0846   AST 32 12/07/2016 1300   AST 20 03/01/2014 0846   ALT 19 12/07/2016 1300   ALT 28 03/01/2014 0846   ALKPHOS 88 12/07/2016 1300   ALKPHOS 122 (H) 03/01/2014 0846   BILITOT 0.3 12/07/2016 1300   BILITOT 0.2 03/01/2014 0846   GFRNONAA >60 12/07/2016 1300   GFRNONAA >60 03/01/2014 0846   GFRNONAA >60 08/24/2013 1015   GFRAA >60 12/07/2016 1300   GFRAA >60 03/01/2014 0846   GFRAA >60 08/24/2013 1015    No results found for: SPEP, UPEP  Lab Results  Component Value Date   WBC 4.6 12/07/2016   NEUTROABS 3.5 12/07/2016   HGB 9.7 (L) 12/07/2016   HCT 29.9 (L) 12/07/2016   MCV 84.0 12/07/2016   PLT 213 12/07/2016      Chemistry      Component Value Date/Time   NA 135 12/07/2016 1300   NA 142 03/01/2014 0846   K 4.0 12/07/2016 1300   K 4.3 03/01/2014 0846   CL 102 12/07/2016 1300   CL 102 03/01/2014 0846   CO2 24 12/07/2016 1300   CO2 28 03/01/2014 0846   BUN 16 12/07/2016 1300   BUN 12 03/01/2014 0846  CREATININE 0.78 12/07/2016 1300   CREATININE 0.95 03/01/2014 0846      Component Value Date/Time   CALCIUM 8.9 12/07/2016 1300   CALCIUM 8.6 03/01/2014 0846   ALKPHOS 88 12/07/2016 1300   ALKPHOS 122 (H) 03/01/2014 0846   AST 32 12/07/2016 1300   AST 20  03/01/2014 0846   ALT 19 12/07/2016 1300   ALT 28 03/01/2014 0846   BILITOT 0.3 12/07/2016 1300   BILITOT 0.2 03/01/2014 0846       RADIOGRAPHIC STUDIES: I have personally reviewed the radiological images as listed and agreed with the findings in the report. No results found.   ASSESSMENT & PLAN:  No problem-specific Assessment & Plan notes found for this encounter.   No orders of the defined types were placed in this encounter.     Cammie Sickle, MD 03/29/2017 8:36 AM

## 2017-04-07 ENCOUNTER — Ambulatory Visit
Admission: RE | Admit: 2017-04-07 | Discharge: 2017-04-07 | Disposition: A | Discharge status: Home / Self Care | Payer: Medicare Other | Source: Ambulatory Visit | Attending: General Surgery | Admitting: General Surgery

## 2017-04-07 DIAGNOSIS — Z171 Estrogen receptor negative status [ER-]: Principal | ICD-10-CM

## 2017-04-07 DIAGNOSIS — C50412 Malignant neoplasm of upper-outer quadrant of left female breast: Secondary | ICD-10-CM

## 2017-04-07 DIAGNOSIS — Z1231 Encounter for screening mammogram for malignant neoplasm of breast: Secondary | ICD-10-CM | POA: Insufficient documentation

## 2017-04-07 DIAGNOSIS — R928 Other abnormal and inconclusive findings on diagnostic imaging of breast: Secondary | ICD-10-CM | POA: Diagnosis not present

## 2017-04-07 IMAGING — MG MM PLC BREAST LOC DEV 1ST LESION INC MAMMO GUIDE*L*
8 of 10 series · 8 of 10 positions shown · non-contrast
Comparison: Previous exams.

CLINICAL DATA: 69-year-old female for wire localization of the
ribbon shaped biopsy marker in the upper, outer left breast. The
patient has received neoadjuvant therapy.

EXAM:
NEEDLE LOCALIZATION OF THE LEFT BREAST WITH MAMMO GUIDANCE

[L CC (1 of 3)]
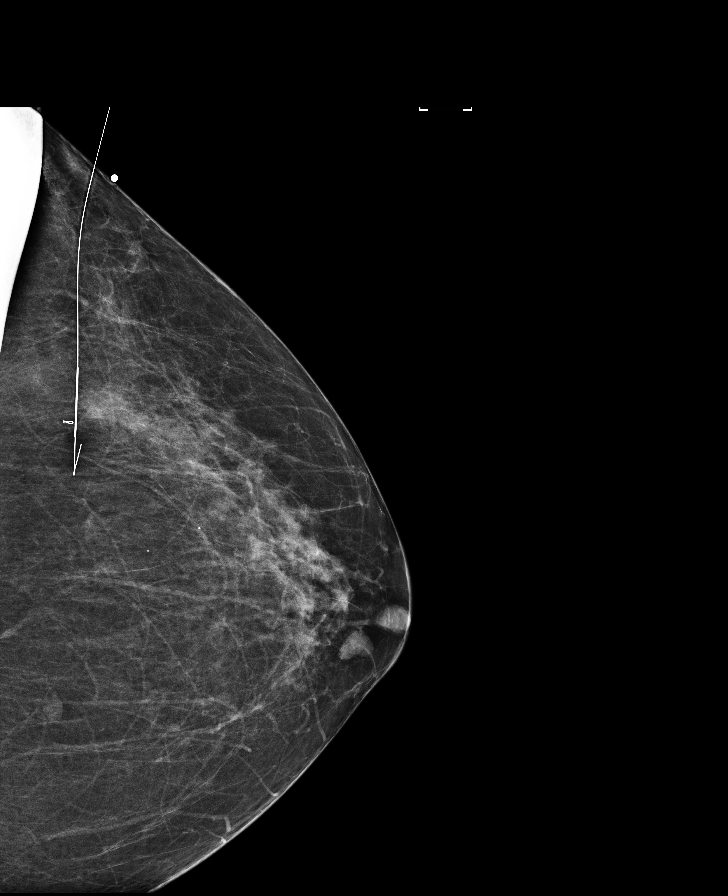

[L LM (1 of 4)]
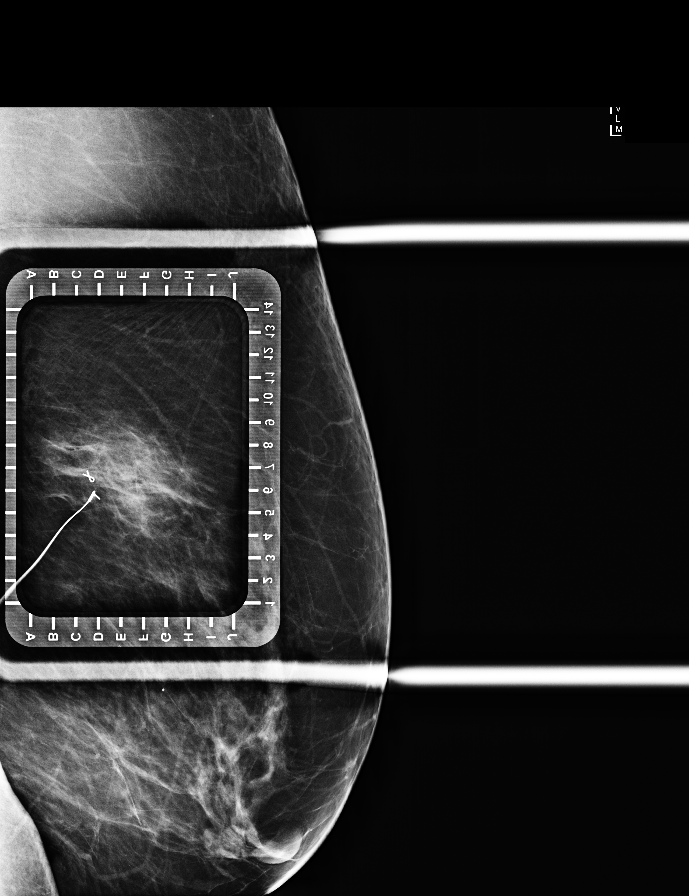

[L ML]
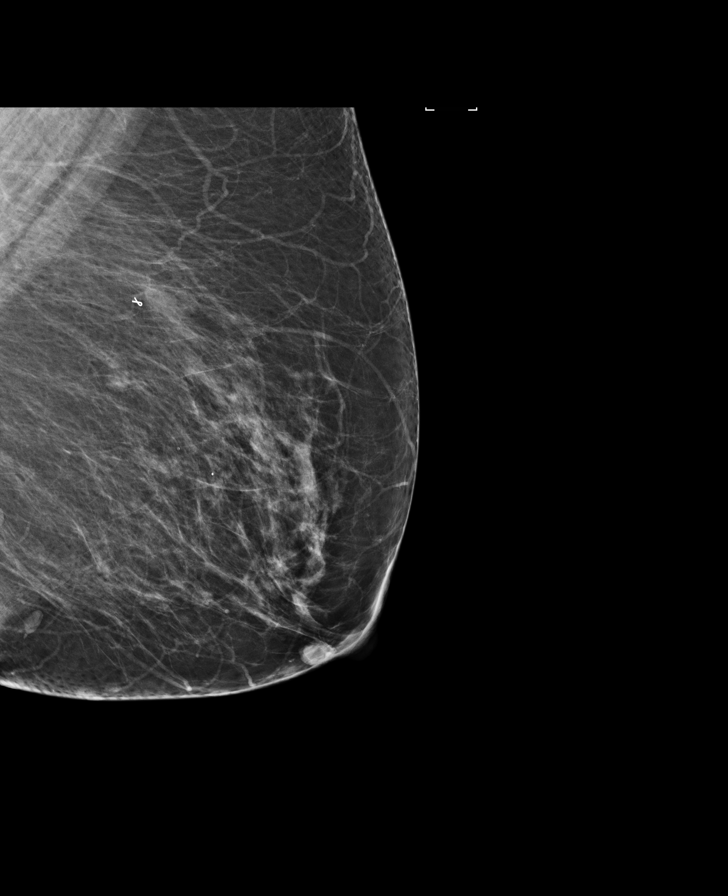

[L CC (2 of 3)]
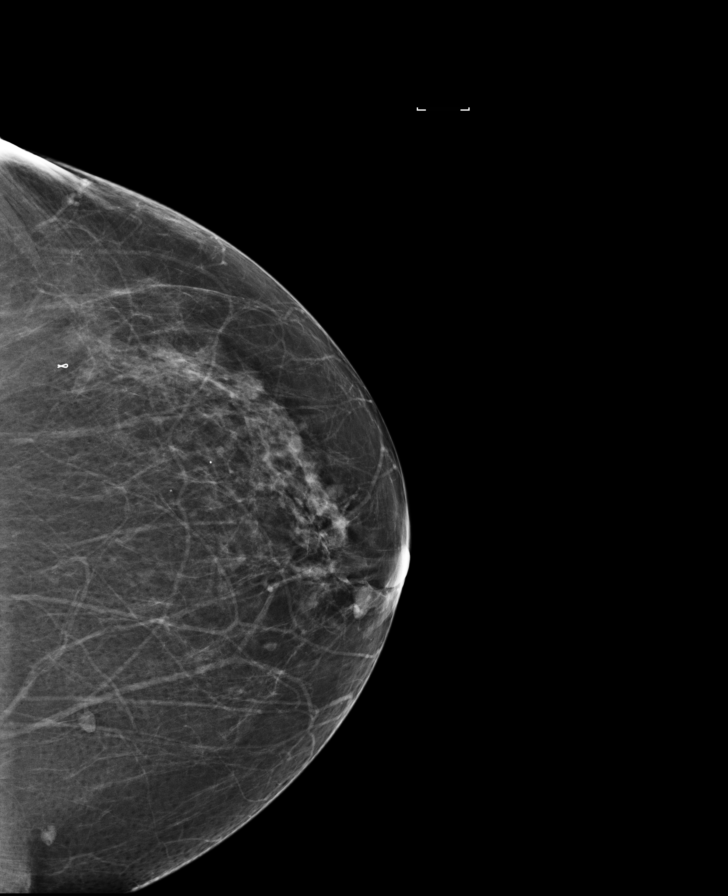

[L LM (2 of 4)]
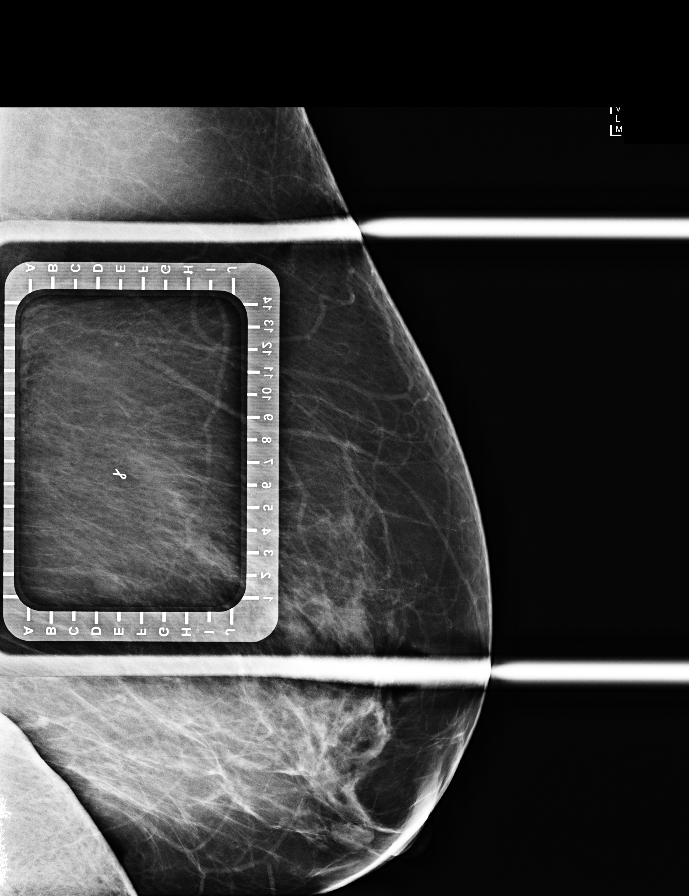

[L LM (3 of 4)]
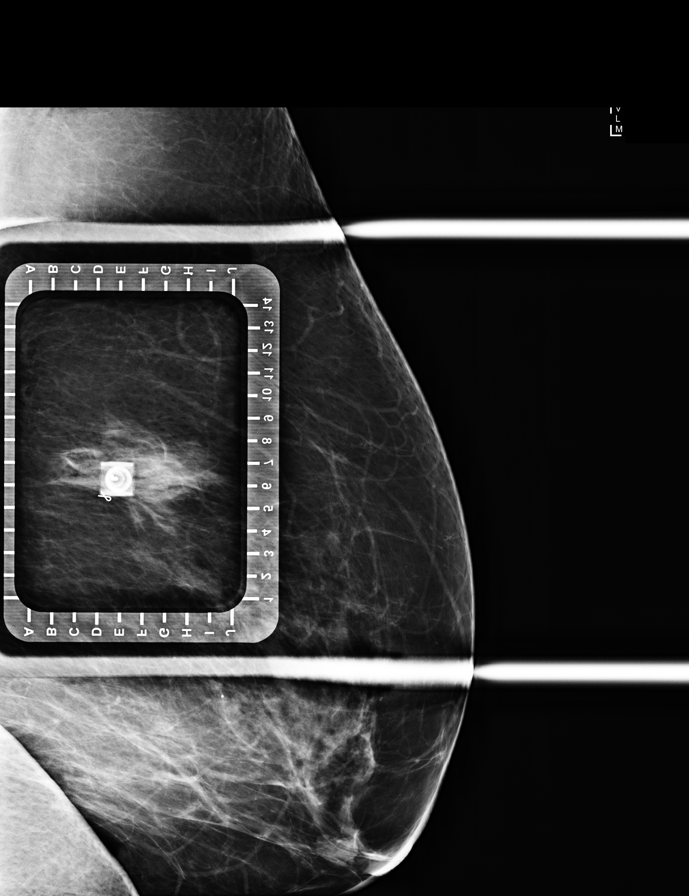

[L CC (3 of 3)]
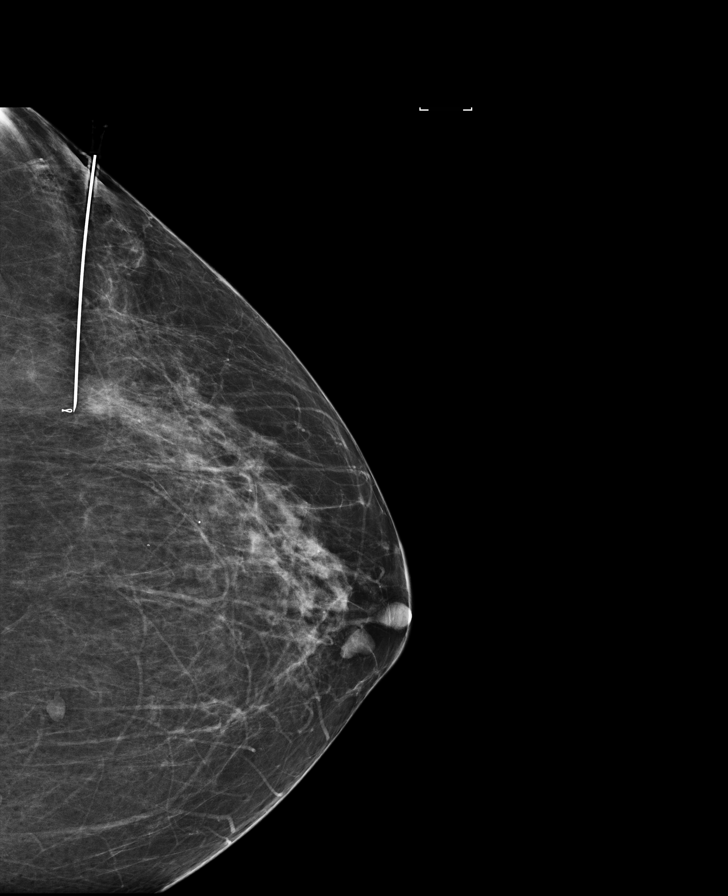

[L LM (4 of 4)]
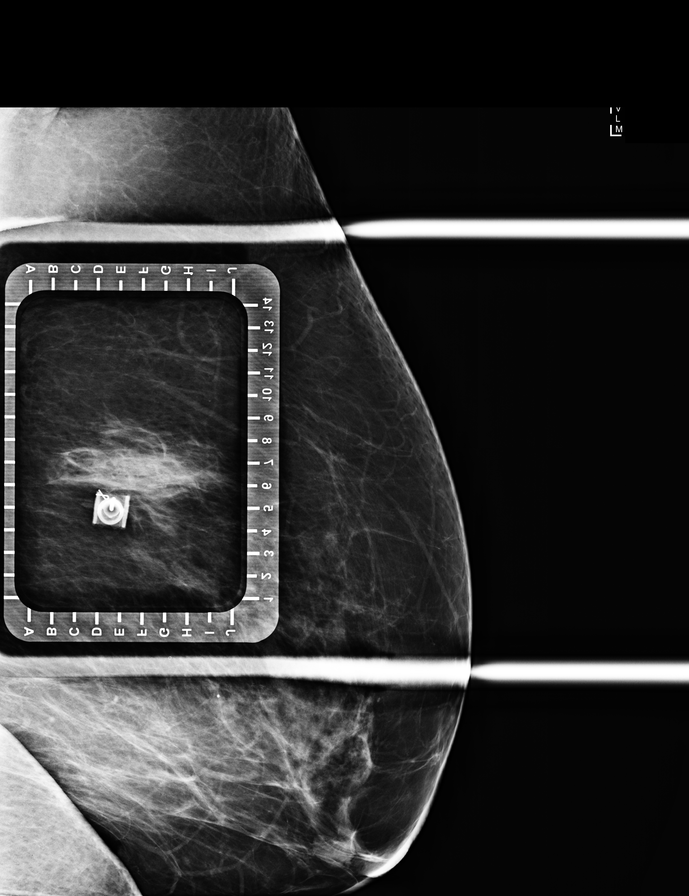

[8 of 10 positions shown; findings below may reference images not displayed]

FINDINGS: Patient presents for needle localization prior to left lumpectomy. I
met with the patient and we discussed the procedure of needle
localization including benefits and alternatives. We discussed the
high likelihood of a successful procedure. We discussed the risks of
the procedure, including infection, bleeding, tissue injury, and
further surgery. Informed, written consent was given. The usual
time-out protocol was performed immediately prior to the procedure.

Using mammographic guidance, sterile technique, 1% lidocaine and a 7
cm modified Kopans needle, the ribbon shaped biopsy marker in the
upper, outer left breast was localized using a lateral to medial
approach. The images were marked for Dr. Laal.
IMPRESSION: Needle localization of the left breast. No apparent complications.

## 2017-04-09 ENCOUNTER — Encounter: Payer: Self-pay | Admitting: *Deleted

## 2017-04-09 ENCOUNTER — Inpatient Hospital Stay: Payer: Medicare Other | Attending: Internal Medicine | Admitting: Internal Medicine

## 2017-04-09 ENCOUNTER — Other Ambulatory Visit: Payer: Self-pay

## 2017-04-09 ENCOUNTER — Inpatient Hospital Stay: Payer: Medicare Other

## 2017-04-09 VITALS — BP 131/71 | HR 88 | Temp 98.1°F | Resp 20

## 2017-04-09 DIAGNOSIS — I10 Essential (primary) hypertension: Secondary | ICD-10-CM | POA: Insufficient documentation

## 2017-04-09 DIAGNOSIS — Z79899 Other long term (current) drug therapy: Secondary | ICD-10-CM | POA: Diagnosis not present

## 2017-04-09 DIAGNOSIS — C50412 Malignant neoplasm of upper-outer quadrant of left female breast: Secondary | ICD-10-CM

## 2017-04-09 DIAGNOSIS — Z883 Allergy status to other anti-infective agents status: Secondary | ICD-10-CM | POA: Diagnosis not present

## 2017-04-09 DIAGNOSIS — E785 Hyperlipidemia, unspecified: Secondary | ICD-10-CM | POA: Insufficient documentation

## 2017-04-09 DIAGNOSIS — Z7984 Long term (current) use of oral hypoglycemic drugs: Secondary | ICD-10-CM | POA: Insufficient documentation

## 2017-04-09 DIAGNOSIS — Z880 Allergy status to penicillin: Secondary | ICD-10-CM | POA: Insufficient documentation

## 2017-04-09 DIAGNOSIS — Z923 Personal history of irradiation: Secondary | ICD-10-CM | POA: Diagnosis not present

## 2017-04-09 DIAGNOSIS — Z171 Estrogen receptor negative status [ER-]: Principal | ICD-10-CM

## 2017-04-09 DIAGNOSIS — Z9221 Personal history of antineoplastic chemotherapy: Secondary | ICD-10-CM | POA: Diagnosis not present

## 2017-04-09 DIAGNOSIS — E1143 Type 2 diabetes mellitus with diabetic autonomic (poly)neuropathy: Secondary | ICD-10-CM | POA: Diagnosis not present

## 2017-04-09 DIAGNOSIS — Z85818 Personal history of malignant neoplasm of other sites of lip, oral cavity, and pharynx: Secondary | ICD-10-CM | POA: Diagnosis not present

## 2017-04-09 DIAGNOSIS — Z87891 Personal history of nicotine dependence: Secondary | ICD-10-CM | POA: Insufficient documentation

## 2017-04-09 DIAGNOSIS — K219 Gastro-esophageal reflux disease without esophagitis: Secondary | ICD-10-CM

## 2017-04-09 DIAGNOSIS — E538 Deficiency of other specified B group vitamins: Secondary | ICD-10-CM | POA: Insufficient documentation

## 2017-04-09 DIAGNOSIS — D649 Anemia, unspecified: Secondary | ICD-10-CM | POA: Insufficient documentation

## 2017-04-09 LAB — CBC WITH DIFFERENTIAL/PLATELET
BASOS PCT: 0 %
Basophils Absolute: 0 10*3/uL (ref 0–0.1)
EOS ABS: 0.2 10*3/uL (ref 0–0.7)
EOS PCT: 3 %
HCT: 30.9 % — ABNORMAL LOW (ref 35.0–47.0)
Hemoglobin: 10 g/dL — ABNORMAL LOW (ref 12.0–16.0)
Lymphocytes Relative: 12 %
Lymphs Abs: 1 10*3/uL (ref 1.0–3.6)
MCH: 26.6 pg (ref 26.0–34.0)
MCHC: 32.3 g/dL (ref 32.0–36.0)
MCV: 82.2 fL (ref 80.0–100.0)
MONOS PCT: 5 %
Monocytes Absolute: 0.4 10*3/uL (ref 0.2–0.9)
Neutro Abs: 6.6 10*3/uL — ABNORMAL HIGH (ref 1.4–6.5)
Neutrophils Relative %: 80 %
Platelets: 244 10*3/uL (ref 150–440)
RBC: 3.76 MIL/uL — ABNORMAL LOW (ref 3.80–5.20)
RDW: 17.2 % — AB (ref 11.5–14.5)
WBC: 8.3 10*3/uL (ref 3.6–11.0)

## 2017-04-09 LAB — COMPREHENSIVE METABOLIC PANEL
ALBUMIN: 3.5 g/dL (ref 3.5–5.0)
ALK PHOS: 86 U/L (ref 38–126)
ALT: 16 U/L (ref 14–54)
AST: 23 U/L (ref 15–41)
Anion gap: 6 (ref 5–15)
BUN: 19 mg/dL (ref 6–20)
CALCIUM: 8.8 mg/dL — AB (ref 8.9–10.3)
CO2: 26 mmol/L (ref 22–32)
CREATININE: 0.74 mg/dL (ref 0.44–1.00)
Chloride: 102 mmol/L (ref 101–111)
GFR calc Af Amer: >60 mL/min (ref >60)
GFR calc non Af Amer: >60 mL/min (ref >60)
Glucose, Bld: 75 mg/dL (ref 65–99)
Potassium: 4.3 mmol/L (ref 3.5–5.1)
SODIUM: 134 mmol/L — AB (ref 135–145)
Total Bilirubin: 0.3 mg/dL (ref 0.3–1.2)
Total Protein: 7.5 g/dL (ref 6.5–8.1)

## 2017-04-09 LAB — MAGNESIUM: Magnesium: 1.4 mg/dL — ABNORMAL LOW (ref 1.7–2.4)

## 2017-04-09 MED ORDER — HEPARIN SOD (PORK) LOCK FLUSH 100 UNIT/ML IV SOLN
500.0000 [IU] | Freq: Once | INTRAVENOUS | Status: AC
  Administered 2017-04-09: 500 [IU] via INTRAVENOUS

## 2017-04-09 MED ORDER — SODIUM CHLORIDE 0.9% FLUSH
10.0000 mL | Freq: Once | INTRAVENOUS | Status: AC
  Administered 2017-04-09: 10 mL via INTRAVENOUS
  Filled 2017-04-09: qty 10

## 2017-04-09 NOTE — Assessment & Plan Note (Addendum)
#   Left breast cancer stage III  ER/PR/her 2 Neu NEG. s/p neoadjuvant chemotherapy; S/p Surgery- complete pathologic response.   # No clinical evidence of progression or recurrence.  # PN- G-2  on 600 TID; amitriptyline 50 mg daily at bedtime; ? Right CTS  # Mild anemia secondary to bone marrow suppression from previous chemotherapy. Hemoglobin 10.2; will check iron studie/ferritin. cologuard 2018- Neg.   # Hypomagnesemia- 1.5;  home supplementation.  # flush every 2 months /follow up in 6 months ?MD- ca-27-29. will add Iron studies.

## 2017-04-09 NOTE — Progress Notes (Signed)
  Oncology Nurse Navigator Documentation  Navigator Location: [facility] (04/09/17 1500)   )Navigator Encounter Type: Telephone (04/09/17 1500)                     Patient Visit Type: Follow-up (04/09/17 1500) Treatment Phase: Follow-up (04/09/17 1500)     Interventions: Referrals (04/09/17 1500) Referrals: Other (04/09/17 1500)      Support Groups/Services: (acupunture) (04/09/17 1500)             Time Spent with Patient: 15 (04/09/17 1500)   Received message from Renita Papa, RN that patient has neuropathy and would like to try acupuncture.  Approval faxed to [place].  Left message for patient regarding her approval.

## 2017-04-09 NOTE — Progress Notes (Signed)
Patient here for breast cancer f/u. She has no medical complaints. She has a recent mammogram on 04/07/17

## 2017-04-09 NOTE — Progress Notes (Signed)
[place] OFFICE PROGRESS NOTE  Patient Care Team: Tower, [name], MD as PCP - General Byrnett, [name], MD (General Surgery) [name], MD (Oncology)  Cancer Staging No matching staging information was found for the patient.    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 5 CM TUMOR MASS BIOPSIES POSITIVE FOR INVASIVE MAMMARY CARCINOMA TRIPLE NEGATIVE DISEASE(jANUARY, 2017)stage IIIa. ER/PR/her2 Neu-NEGATIVE  # Cytoxan and Adriamycin from March 18, 2015; carbo-taxol x4 cycles  # AUg 18th s/p Lumpec & SLNBx- Complete path CR [ypT0 ypsn0] s/p RT [oct 2017; Dr.Crystal]  3. MUGA scan of the heart shows ejection fraction to be 61% (January, 2017) 4     Carcinoma of upper-outer quadrant of left breast in female, estrogen receptor negative ([place])   11/21/2015 Initial Diagnosis    Carcinoma of upper-outer quadrant of left breast in female, estrogen receptor negative ([place])      INTERVAL HISTORY:  Suzanne Strong 72 y.o.  female pleasant patient above history of stage IIIa breast cancer triple negative- Is here for follow-up.  She is awaiting a referral to a hand specialist for possible carpal tunnel in her right upper extremity.  Has been evaluated by acupuncture; no benefit noted.  Otherwise no bone pain. No headaches. No chest pain or shortness of breath or cough.  REVIEW OF SYSTEMS:  A complete 10 point review of system is done which is negative except mentioned above/history of present illness.   PAST MEDICAL HISTORY :  Past Medical History:  Diagnosis Date  . Anemia    iron deficiency and B12 deficiency  . Anxiety   . Breast cancer ([place]) 02/25/2015   upper-outer quadrant of left female breast, Triple Negative. Neo-adjuvant chemo with  complete pathologic response.   . Cervical dysplasia    conization  . Diabetes mellitus without complication ([place])   . Diverticulosis   . Fever 04/11/2015  . Gastroparesis    related to previous radiation tx  . GERD (gastroesophageal reflux disease)   . Hyperlipidemia   . Hypertension   . Shingles    Hx of  . 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 APPENDECTOMY    . BREAST BIOPSY Left 02/25/2015   INVASIVE MAMMARY CARCINOMA,Triple negative.  [name] BREAST CYST ASPIRATION    . BREAST EXCISIONAL BIOPSY Left 12/2015   surgery  . BREAST LUMPECTOMY WITH NEEDLE LOCALIZATION Left 10/02/2015   Procedure: BREAST LUMPECTOMY WITH NEEDLE LOCALIZATION;  Surgeon: [name], MD;  Location: [facility] ORS;  Service: General;  Laterality: Left;  . BREAST LUMPECTOMY WITH SENTINEL LYMPH NODE BIOPSY Left 10/02/2015   Procedure: LEFT BREAST WIDE EXCISION WITH SENTINEL LYMPH NODE BX;  Surgeon: [name], MD;  Location: [facility] ORS;  Service: General;  Laterality: Left;  . BREAST SURGERY     breast biopsy benign  . CHOLECYSTECTOMY    . Cologuard  07/22/2016   Negative  . ESOPHAGOGASTRODUODENOSCOPY  07/2009   normal with few gastric polyps  . MOLE REMOVAL    . PORT-A-CATH REMOVAL    . PORTACATH PLACEMENT    . PORTACATH PLACEMENT Right 03/12/2015   Procedure: INSERTION PORT-A-CATH;  Surgeon: [name], MD;  Location: [facility] ORS;  Service: General;  Laterality: Right;  .  RADICAL NECK DISSECTION    . TONSILLECTOMY     cancer treated with chemo and radiation    FAMILY HISTORY :   Family History  Problem Relation Age of Onset  . Osteoporosis Mother   . Hyperlipidemia Mother   . Depression Mother   . Cancer Mother        skin cancer ? basal cell and lung ca heavy smoker  . Alcohol abuse Father   . Cancer Father        skin CA ? basal cell  . Heart disease Father        CHF  . Hyperlipidemia Brother   .  Hypertension Brother     SOCIAL HISTORY:   Social History   Tobacco Use  . Smoking status: Former Smoker    Packs/day: 0.50    Years: 6.00    Pack years: 3.00    Types: Cigarettes    Last attempt to quit: 03/10/1984    Years since quitting: 33.1  . Smokeless tobacco: Never Used  Substance Use Topics  . Alcohol use: No    Alcohol/week: 0.0 oz  . Drug use: No    ALLERGIES:  is allergic to amoxicillin and fluconazole.  MEDICATIONS:  Current Outpatient Medications  Medication Sig Dispense Refill  . ALPRAZolam (XANAX) 0.5 MG tablet Take 1 tablet (0.5 mg total) by mouth at bedtime as needed for sleep. 30 tablet 3  . amitriptyline (ELAVIL) 50 MG tablet Take 1 tablet (50 mg total) by mouth at bedtime. 90 tablet 3  . atorvastatin (LIPITOR) 10 MG tablet Take 1 tablet (10 mg total) by mouth daily. 30 tablet 11  . b complex vitamins tablet Take 1 tablet by mouth daily.      . cyanocobalamin (,VITAMIN B-12,) 1000 MCG/ML injection Inject 1,000 mcg into the muscle every 30 (thirty) days.      [name] gabapentin (NEURONTIN) 300 MG capsule Take 2 capsules (600 mg total) by mouth 3 (three) times daily. 180 capsule 6  . glipiZIDE (GLUCOTROL) 10 MG tablet Take 10 mg by mouth daily before breakfast.     . JANUMET XR 50-1000 MG TB24 Take 1 tablet by mouth 2 (two) times daily.   0  . lidocaine-prilocaine (EMLA) cream Apply 1 application topically as needed. Apply to port when going through Chemo    . lisinopril (PRINIVIL,ZESTRIL) 5 MG tablet Take 1 tablet (5 mg total) by mouth daily. 30 tablet 11  . magnesium chloride (SLOW-MAG) 64 MG TBEC SR tablet Take 1 tablet by mouth.     . mirtazapine (REMERON) 30 MG tablet take 2 tablet by mouth at bedtime  0  . omeprazole (PRILOSEC) 20 MG capsule Take 1 capsule (20 mg total) by mouth daily. 30 capsule 11  . rOPINIRole (REQUIP) 1 MG tablet take 1 tablet by mouth every morning if needed and 3 tablets at bedtime 120 tablet 11  . sertraline (ZOLOFT) 100 MG tablet Take  200 mg by mouth daily.  0  . spironolactone (ALDACTONE) 25 MG tablet take 1 tablet by mouth once daily if needed 30 tablet 4  . vitamin B-12 (CYANOCOBALAMIN) 1000 MCG tablet Take by mouth.    . fluticasone (FLONASE) 50 MCG/ACT nasal spray instill 2 sprays into each nostril once daily (Patient not taking: Reported on 04/09/2017) 16 g 11   No current facility-administered medications for this visit.    Facility-Administered Medications Ordered in Other Visits  Medication Dose Route Frequency Provider Last Rate Last Dose  . sodium chloride  flush (NS) 0.9 % injection 10 mL  10 mL Intravenous PRN Charlaine Dalton R, MD   10 mL at 09/11/15 0845    PHYSICAL EXAMINATION: ECOG PERFORMANCE STATUS: 0 - Asymptomatic  BP 131/71   Pulse 88   Temp 98.1 F (36.7 C) (Oral)   Resp 20   There were no vitals filed for this visit.  GENERAL: Well-nourished well-developed; Alert, no distress and comfortable.   Alone. EYES: no pallor or icterus OROPHARYNX: no thrush or ulceration; good dentition  NECK: supple, no masses felt LYMPH:  no palpable lymphadenopathy in the cervical, axillary or inguinal regions LUNGS: clear to auscultation and  No wheeze or crackles HEART/CVS: regular rate & rhythm and no murmurs; grade 1bilateral lower extremity edema ABDOMEN:abdomen soft, non-tender and normal bowel sounds Musculoskeletal:no cyanosis of digits and no clubbing  PSYCH: alert & oriented x 3 with fluent speech NEURO: no focal motor/sensory deficits SKIN:  no rashes or significant lesions  LABORATORY DATA:  I have reviewed the data as listed    Component Value Date/Time   NA 134 (L) 04/09/2017 1300   NA 142 03/01/2014 0846   K 4.3 04/09/2017 1300   K 4.3 03/01/2014 0846   CL 102 04/09/2017 1300   CL 102 03/01/2014 0846   CO2 26 04/09/2017 1300   CO2 28 03/01/2014 0846   GLUCOSE 75 04/09/2017 1300   GLUCOSE 219 (H) 03/01/2014 0846   BUN 19 04/09/2017 1300   BUN 12 03/01/2014 0846   CREATININE  0.74 04/09/2017 1300   CREATININE 0.95 03/01/2014 0846   CALCIUM 8.8 (L) 04/09/2017 1300   CALCIUM 8.6 03/01/2014 0846   PROT 7.5 04/09/2017 1300   PROT 7.5 03/01/2014 0846   ALBUMIN 3.5 04/09/2017 1300   ALBUMIN 3.4 03/01/2014 0846   AST 23 04/09/2017 1300   AST 20 03/01/2014 0846   ALT 16 04/09/2017 1300   ALT 28 03/01/2014 0846   ALKPHOS 86 04/09/2017 1300   ALKPHOS 122 (H) 03/01/2014 0846   BILITOT 0.3 04/09/2017 1300   BILITOT 0.2 03/01/2014 0846   GFRNONAA >60 04/09/2017 1300   GFRNONAA >60 03/01/2014 0846   GFRNONAA >60 08/24/2013 1015   GFRAA >60 04/09/2017 1300   GFRAA >60 03/01/2014 0846   GFRAA >60 08/24/2013 1015    No results found for: SPEP, UPEP  Lab Results  Component Value Date   WBC 8.3 04/09/2017   NEUTROABS 6.6 (H) 04/09/2017   HGB 10.0 (L) 04/09/2017   HCT 30.9 (L) 04/09/2017   MCV 82.2 04/09/2017   PLT 244 04/09/2017      Chemistry      Component Value Date/Time   NA 134 (L) 04/09/2017 1300   NA 142 03/01/2014 0846   K 4.3 04/09/2017 1300   K 4.3 03/01/2014 0846   CL 102 04/09/2017 1300   CL 102 03/01/2014 0846   CO2 26 04/09/2017 1300   CO2 28 03/01/2014 0846   BUN 19 04/09/2017 1300   BUN 12 03/01/2014 0846   CREATININE 0.74 04/09/2017 1300   CREATININE 0.95 03/01/2014 0846      Component Value Date/Time   CALCIUM 8.8 (L) 04/09/2017 1300   CALCIUM 8.6 03/01/2014 0846   ALKPHOS 86 04/09/2017 1300   ALKPHOS 122 (H) 03/01/2014 0846   AST 23 04/09/2017 1300   AST 20 03/01/2014 0846   ALT 16 04/09/2017 1300   ALT 28 03/01/2014 0846   BILITOT 0.3 04/09/2017 1300   BILITOT 0.2 03/01/2014 0846  RADIOGRAPHIC STUDIES: I have personally reviewed the radiological images as listed and agreed with the findings in the report. No results found.   ASSESSMENT & PLAN:  Carcinoma of upper-outer quadrant of left breast in female, estrogen receptor negative ([place]) # Left breast cancer stage III  ER/PR/her 2 Neu NEG. s/p neoadjuvant  chemotherapy; S/p Surgery- complete pathologic response.   # No clinical evidence of progression or recurrence.  # PN- G-2  on 600 TID; amitriptyline 50 mg daily at bedtime; ? Right CTS  # Mild anemia secondary to bone marrow suppression from previous chemotherapy. Hemoglobin 10.2; will check iron studie/ferritin. cologuard 2018- Neg.   # Hypomagnesemia- 1.5;  home supplementation.  # flush every 2 months /follow up in 6 months ?MD- ca-27-29. will add Iron studies.    Orders Placed This Encounter  Procedures  . CBC with Differential/Platelet    Standing Status:   Future    Standing Expiration Date:   04/09/2018  . Comprehensive metabolic panel    Standing Status:   Future    Standing Expiration Date:   04/09/2018  . Cancer antigen 27.29    Standing Status:   Future    Standing Expiration Date:   04/09/2018      Cammie Sickle, MD 04/10/2017 9:59 PM

## 2017-04-13 ENCOUNTER — Ambulatory Visit (INDEPENDENT_AMBULATORY_CARE_PROVIDER_SITE_OTHER): Payer: Medicare Other | Admitting: General Surgery

## 2017-04-13 ENCOUNTER — Encounter: Payer: Self-pay | Admitting: General Surgery

## 2017-04-13 VITALS — BP 136/74 | HR 90 | Resp 14 | Ht 66.0 in | Wt 227.0 lb

## 2017-04-13 DIAGNOSIS — Z171 Estrogen receptor negative status [ER-]: Secondary | ICD-10-CM

## 2017-04-13 DIAGNOSIS — C50412 Malignant neoplasm of upper-outer quadrant of left female breast: Secondary | ICD-10-CM | POA: Diagnosis not present

## 2017-04-13 DIAGNOSIS — G5601 Carpal tunnel syndrome, right upper limb: Secondary | ICD-10-CM | POA: Diagnosis not present

## 2017-04-13 NOTE — Patient Instructions (Addendum)
The patient has been asked to return to the office in one year with a bilateral diagnostic mammogram.  Patient to see Dr. Sabra Heck for her hand. [phone]

## 2017-04-13 NOTE — Progress Notes (Signed)
Patient ID: Suzanne Strong, female   DOB: 27-May-1945, 72 y.o.   MRN: [id number]  Chief Complaint  Patient presents with  . Follow-up    HPI Suzanne Strong is a 72 y.o. female who presents for a breast evaluation. The most recent mammogram was done on 04/07/2017  Patient does perform regular self breast checks and gets regular mammograms done.  Patient saw Dr.Brahmanday on 04/09/2017.   Want to see a Doctor for carpal tunnel  HPI  Past Medical History:  Diagnosis Date  . Anemia    iron deficiency and B12 deficiency  . Anxiety   . Breast cancer ([place]) 02/25/2015   upper-outer quadrant of left female breast, Triple Negative. Neo-adjuvant chemo with complete pathologic response.   . Cervical dysplasia    conization  . Diabetes mellitus without complication ([place])   . Diverticulosis   . Fever 04/11/2015  . Gastroparesis    related to previous radiation tx  . GERD (gastroesophageal reflux disease)   . Hyperlipidemia   . Hypertension   . Shingles    Hx of  . 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 APPENDECTOMY    . BREAST BIOPSY Left 02/25/2015   INVASIVE MAMMARY CARCINOMA,Triple negative.  [name] BREAST CYST ASPIRATION    . BREAST EXCISIONAL BIOPSY Left 12/2015   surgery  . BREAST LUMPECTOMY WITH NEEDLE LOCALIZATION Left 10/02/2015   Procedure: BREAST LUMPECTOMY WITH NEEDLE LOCALIZATION;  Surgeon: [name], MD;  Location: [facility] ORS;  Service: General;  Laterality: Left;  . BREAST LUMPECTOMY WITH SENTINEL LYMPH NODE BIOPSY Left 10/02/2015   Procedure: LEFT BREAST WIDE EXCISION WITH SENTINEL LYMPH NODE BX;  Surgeon: [name], MD;  Location: [facility] ORS;  Service: General;  Laterality: Left;  . BREAST SURGERY     breast biopsy benign  . CHOLECYSTECTOMY    . Cologuard  07/22/2016   Negative  . ESOPHAGOGASTRODUODENOSCOPY  07/2009   normal with few gastric polyps  . MOLE REMOVAL    . PORT-A-CATH REMOVAL    . PORTACATH  PLACEMENT    . PORTACATH PLACEMENT Right 03/12/2015   Procedure: INSERTION PORT-A-CATH;  Surgeon: [name], MD;  Location: [facility] ORS;  Service: General;  Laterality: Right;  . RADICAL NECK DISSECTION    . TONSILLECTOMY     cancer treated with chemo and radiation    Family History  Problem Relation Age of Onset  . Osteoporosis Mother   . Hyperlipidemia Mother   . Depression Mother   . Cancer Mother        skin cancer ? basal cell and lung ca heavy smoker  . Alcohol abuse Father   . Cancer Father        skin CA ? basal cell  . Heart disease Father        CHF  . Hyperlipidemia Brother   . Hypertension Brother     Social History Social History   Tobacco Use  . Smoking status: Former Smoker    Packs/day: 0.50    Years: 6.00    Pack years: 3.00    Types: Cigarettes    Last attempt to quit: 03/10/1984    Years since quitting: 33.1  . Smokeless tobacco: Never Used  Substance Use Topics  . Alcohol use: No    Alcohol/week: 0.0 oz  . Drug use: No    Allergies  Allergen Reactions  . Amoxicillin     REACTION: rash  . Fluconazole  REACTION: hives    Current Outpatient Medications  Medication Sig Dispense Refill  . ALPRAZolam (XANAX) 0.5 MG tablet Take 1 tablet (0.5 mg total) by mouth at bedtime as needed for sleep. 30 tablet 3  . amitriptyline (ELAVIL) 50 MG tablet Take 1 tablet (50 mg total) by mouth at bedtime. 90 tablet 3  . atorvastatin (LIPITOR) 10 MG tablet Take 1 tablet (10 mg total) by mouth daily. 30 tablet 11  . b complex vitamins tablet Take 1 tablet by mouth daily.      . cyanocobalamin (,VITAMIN B-12,) 1000 MCG/ML injection Inject 1,000 mcg into the muscle every 30 (thirty) days.      . fluticasone (FLONASE) 50 MCG/ACT nasal spray instill 2 sprays into each nostril once daily 16 g 11  . gabapentin (NEURONTIN) 300 MG capsule Take 2 capsules (600 mg total) by mouth 3 (three) times daily. 180 capsule 6  . glipiZIDE (GLUCOTROL) 10 MG tablet Take 10 mg by  mouth daily before breakfast.     . JANUMET XR 50-1000 MG TB24 Take 1 tablet by mouth 2 (two) times daily.   0  . lidocaine-prilocaine (EMLA) cream Apply 1 application topically as needed. Apply to port when going through Chemo    . lisinopril (PRINIVIL,ZESTRIL) 5 MG tablet Take 1 tablet (5 mg total) by mouth daily. 30 tablet 11  . magnesium chloride (SLOW-MAG) 64 MG TBEC SR tablet Take 1 tablet by mouth.     . mirtazapine (REMERON) 30 MG tablet take 2 tablet by mouth at bedtime  0  . omeprazole (PRILOSEC) 20 MG capsule Take 1 capsule (20 mg total) by mouth daily. 30 capsule 11  . rOPINIRole (REQUIP) 1 MG tablet take 1 tablet by mouth every morning if needed and 3 tablets at bedtime 120 tablet 11  . sertraline (ZOLOFT) 100 MG tablet Take 200 mg by mouth daily.  0  . spironolactone (ALDACTONE) 25 MG tablet take 1 tablet by mouth once daily if needed 30 tablet 4  . vitamin B-12 (CYANOCOBALAMIN) 1000 MCG tablet Take by mouth.     No current facility-administered medications for this visit.    Facility-Administered Medications Ordered in Other Visits  Medication Dose Route Frequency Provider Last Rate Last Dose  . sodium chloride flush (NS) 0.9 % injection 10 mL  10 mL Intravenous PRN Cammie Sickle, MD   10 mL at 09/11/15 0845    Review of Systems Review of Systems  Constitutional: Negative.   Respiratory: Negative.   Cardiovascular: Negative.     Blood pressure 136/74, pulse 90, resp. rate 14, height 5\' 6"  (1.676 m), weight 227 lb (103 kg).  Physical Exam Physical Exam  Constitutional: She is oriented to person, place, and time. She appears well-developed and well-nourished.  Eyes: Conjunctivae are normal. No scleral icterus.  Neck: Neck supple.  Cardiovascular: Normal rate, regular rhythm and normal heart sounds.  Pulmonary/Chest: Effort normal and breath sounds normal. Right breast exhibits no inverted nipple, no mass, no nipple discharge, no skin change and no tenderness.  Left breast exhibits no inverted nipple, no mass, no nipple discharge, no skin change and no tenderness.    Lymphadenopathy:    She has no cervical adenopathy.    She has no axillary adenopathy.  Neurological: She is alert and oriented to person, place, and time.  Skin: Skin is warm and dry.    Data Reviewed Neurology notes from November 13, 2016 documenting right carpal tunnel syndrome.  This was based on NCS/EMG study.  Medical oncology notes of April 09, 2017 reviewed.  Bilateral diagnostic mammogram of April 07, 2017 reviewed: No interval change.  BI-RADS-2.  Assessment    Table breast exam.  Right carpal tunnel syndrome.    Plan  The patient has been asked to return to the office in one year with a bilateral diagnostic mammogram.  Patient to see Dr Earnestine Leys for her triple tunnel syndrome.  HPI, Physical Exam, Assessment and Plan have been scribed under the direction and in the presence of Hervey Ard, MD.  Gaspar Cola, CMA  I have completed the exam and reviewed the above documentation for accuracy and completeness.  I agree with the above.  Haematologist has been used and any errors in dictation or transcription are unintentional.  Hervey Ard, M.D., F.A.C.S. [name] Camille Dragan 04/15/2017, 8:53 AM

## 2017-04-16 ENCOUNTER — Telehealth: Payer: Self-pay | Admitting: Neurology

## 2017-04-16 NOTE — Telephone Encounter (Signed)
I do not have any imaging report or CD for this patient.

## 2017-04-16 NOTE — Telephone Encounter (Signed)
Left message that we do not have a report or a disc.  Instructed her to call where she had the MRI done and they will give her another copy.

## 2017-04-16 NOTE — Telephone Encounter (Signed)
Do you have her disc?

## 2017-04-16 NOTE — Telephone Encounter (Signed)
Patient called and has seen an orthopaedic Surgeon Dr. Park Breed on [facility] in [place]. She would like her EMG/NCS results faxed to his office. Thanks

## 2017-04-16 NOTE — Telephone Encounter (Signed)
EMG faxed.  

## 2017-04-16 NOTE — Telephone Encounter (Signed)
Pt left a message saying she was referred to ortho and wants her disc of images sent

## 2017-04-20 ENCOUNTER — Other Ambulatory Visit: Payer: Self-pay

## 2017-04-20 ENCOUNTER — Ambulatory Visit
Admission: RE | Admit: 2017-04-20 | Discharge: 2017-04-20 | Disposition: A | Discharge status: Home / Self Care | Payer: Medicare Other | Source: Ambulatory Visit | Attending: Radiation Oncology | Admitting: Radiation Oncology

## 2017-04-20 ENCOUNTER — Encounter: Payer: Self-pay | Admitting: Radiation Oncology

## 2017-04-20 VITALS — BP 129/74 | HR 88 | Temp 97.3°F | Resp 20 | Wt 227.4 lb

## 2017-04-20 DIAGNOSIS — C50412 Malignant neoplasm of upper-outer quadrant of left female breast: Secondary | ICD-10-CM | POA: Diagnosis not present

## 2017-04-20 DIAGNOSIS — Z853 Personal history of malignant neoplasm of breast: Secondary | ICD-10-CM | POA: Diagnosis not present

## 2017-04-20 DIAGNOSIS — Z171 Estrogen receptor negative status [ER-]: Secondary | ICD-10-CM | POA: Diagnosis not present

## 2017-04-20 DIAGNOSIS — Z923 Personal history of irradiation: Secondary | ICD-10-CM | POA: Diagnosis not present

## 2017-04-20 NOTE — Progress Notes (Signed)
Radiation Oncology Follow up Note  Name: Suzanne Strong   Date:   04/20/2017 MRN:  [id number] DOB: May 30, 1945    This 72 y.o. 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  REFERRING PROVIDER: [name], MD  HPI: Patient is a 71 year old female now out 1 year having completed whole breast radiation to her left breast for triple negative invasive mammary carcinoma. She is seen today in routine follow-up she is doing well. She specifically denies breast tenderness cough or bone pain.. She recently had mammograms which I have reviewed which are BI-RADS 2 benign. She is not on antiestrogen therapy based on the triple negative nature of her disease.  COMPLICATIONS OF TREATMENT: none  FOLLOW UP COMPLIANCE: keeps appointments   PHYSICAL EXAM:  BP 129/74   Pulse 88   Temp (!) 97.3 F (36.3 C)   Resp 20   Wt 227 lb 6.5 oz (103.1 kg)   BMI 36.70 kg/m  Lungs are clear to A&P cardiac examination essentially unremarkable with regular rate and rhythm. No dominant mass or nodularity is noted in either breast in 2 positions examined. Incision is well-healed. No axillary or supraclavicular adenopathy is appreciated. Cosmetic result is excellent. She does have a Port-A-Cath placed in her right anterior chest wall. Well-developed well-nourished patient in NAD. HEENT reveals PERLA, EOMI, discs not visualized.  Oral cavity is clear. No oral mucosal lesions are identified. Neck is clear without evidence of cervical or supraclavicular adenopathy. Lungs are clear to A&P. Cardiac examination is essentially unremarkable with regular rate and rhythm without murmur rub or thrill. Abdomen is benign with no organomegaly or masses noted. Motor sensory and DTR levels are equal and symmetric in the upper and lower extremities. Cranial nerves II through XII are grossly intact. Proprioception is  intact. No peripheral adenopathy or edema is identified. No motor or sensory levels are noted. Crude visual fields are within normal range.  RADIOLOGY RESULTS: Mammogram reviewed and compatible with the above-stated findings  PLAN: Present time patient is doing well with no evidence of disease 1 year out. I'm please were overall progress. I've asked to see her back in 1 year for follow-up. She knows to call anytime with any concerns.  I would like to take this opportunity to thank you for allowing me to participate in the care of your patient.Noreene Filbert, MD

## 2017-04-22 ENCOUNTER — Encounter: Payer: Self-pay | Admitting: Internal Medicine

## 2017-04-22 DIAGNOSIS — G5601 Carpal tunnel syndrome, right upper limb: Secondary | ICD-10-CM | POA: Diagnosis not present

## 2017-04-27 ENCOUNTER — Encounter: Payer: Self-pay | Admitting: Family Medicine

## 2017-04-27 ENCOUNTER — Ambulatory Visit (INDEPENDENT_AMBULATORY_CARE_PROVIDER_SITE_OTHER): Payer: Medicare Other | Admitting: Family Medicine

## 2017-04-27 VITALS — BP 126/70 | HR 95 | Temp 97.9°F | Ht 66.0 in | Wt 226.5 lb

## 2017-04-27 DIAGNOSIS — E119 Type 2 diabetes mellitus without complications: Secondary | ICD-10-CM

## 2017-04-27 DIAGNOSIS — D509 Iron deficiency anemia, unspecified: Secondary | ICD-10-CM | POA: Diagnosis not present

## 2017-04-27 DIAGNOSIS — Z01810 Encounter for preprocedural cardiovascular examination: Secondary | ICD-10-CM | POA: Diagnosis not present

## 2017-04-27 DIAGNOSIS — G620 Drug-induced polyneuropathy: Secondary | ICD-10-CM | POA: Diagnosis not present

## 2017-04-27 DIAGNOSIS — Z01818 Encounter for other preprocedural examination: Secondary | ICD-10-CM

## 2017-04-27 DIAGNOSIS — G5601 Carpal tunnel syndrome, right upper limb: Secondary | ICD-10-CM | POA: Diagnosis not present

## 2017-04-27 DIAGNOSIS — R8290 Unspecified abnormal findings in urine: Secondary | ICD-10-CM

## 2017-04-27 DIAGNOSIS — G4733 Obstructive sleep apnea (adult) (pediatric): Secondary | ICD-10-CM | POA: Diagnosis not present

## 2017-04-27 DIAGNOSIS — T451X5A Adverse effect of antineoplastic and immunosuppressive drugs, initial encounter: Secondary | ICD-10-CM

## 2017-04-27 LAB — POCT URINALYSIS DIPSTICK
BILIRUBIN UA: NEGATIVE
Blood, UA: NEGATIVE
Glucose, UA: NEGATIVE
Ketones, UA: NEGATIVE
Nitrite, UA: NEGATIVE
PROTEIN UA: NEGATIVE
Spec Grav, UA: 1.015 (ref 1.010–1.025)
UROBILINOGEN UA: 0.2 U/dL
pH, UA: 6 (ref 5.0–8.0)

## 2017-04-27 NOTE — Patient Instructions (Signed)
Exam and EKG are re assuring for your upcoming surgery  We will check a urinalysis  Continue current medicines/supplements   Take care of yourself and good luck with surgery

## 2017-04-27 NOTE — Assessment & Plan Note (Signed)
Planning upcoming surgery

## 2017-04-27 NOTE — Assessment & Plan Note (Signed)
No changes  Her R CTS may worsen symptoms

## 2017-04-27 NOTE — Assessment & Plan Note (Signed)
Upcoming CTS sx in april Reassuring exam and EKG Rev last labs ua -leukocytes (sent for cx) no symptoms Aware of neck surgical changes (? If she will be intubated for this surgery) No anticoag No hx of anaphylaxis  No hx of trouble with anesthesia  Chronic problems are controlled  Aware -no cpap for OSA Anemia-baseline Throat surg changes  DM good control -A1C soon with endo  Overall I think she will do well with carpal tunnel surgery

## 2017-04-27 NOTE — Assessment & Plan Note (Signed)
Per pt well controlled She will have her A1C with endocrinology soon /before hand surgery

## 2017-04-27 NOTE — Assessment & Plan Note (Signed)
Now taking slow-mag

## 2017-04-27 NOTE — Progress Notes (Signed)
Subjective:    Patient ID: Suzanne Strong, female    DOB: Oct 16, 1945, 72 y.o.   MRN: [id number]  HPI Here for preoperative visit/clearance for upcoming carpal tunnel release surgery  Dr Sabra Heck is her surgeon  At [facility]  April 3 is surgery date  R hand -is R handed     Wt Readings from Last 3 Encounters:  04/27/17 226 lb 8 oz (102.7 kg)  04/20/17 227 lb 6.5 oz (103.1 kg)  04/13/17 227 lb (103 kg)  wt is stable  36.56 kg/m   She has had multiple surgeries with gen and local anesthesia w/o problems in the past   Not taking any anticoag medications or asa   Hx of OSA (surgeon is aware)  Does not use cpap   Sees Dr Gabriel Carina for DM2 She has appt soon /end of this mo A1C has been around 7 and glucose under 150 most of the time   Blood pressure BP Readings from Last 3 Encounters:  04/27/17 126/70  04/20/17 129/74  04/13/17 136/74   Pulse Readings from Last 3 Encounters:  04/27/17 95  04/20/17 88  04/13/17 90   Does not take flu or pneumovax - her oncologist adv her to wait on these in light of immune issues   H/o iron def anemia  Lab Results  Component Value Date   WBC 8.3 04/09/2017   HGB 10.0 (L) 04/09/2017   HCT 30.9 (L) 04/09/2017   MCV 82.2 04/09/2017   PLT 244 04/09/2017   takes iron  Sees heme /onc (Dr Yevette Edwards) and Dr Bary Castilla May work on iron study  Has had GI w/u -all looks nl  Also marrow supression from prev chemo   Gets B12 shots for def  Is up to date   Recent labs Lab Results  Component Value Date   CREATININE 0.74 04/09/2017   BUN 19 04/09/2017   NA 134 (L) 04/09/2017   K 4.3 04/09/2017   CL 102 04/09/2017   CO2 26 04/09/2017   Lab Results  Component Value Date   ALT 16 04/09/2017   AST 23 04/09/2017   ALKPHOS 86 04/09/2017   BILITOT 0.3 04/09/2017    Glucose was 75 at that time  Magnesium of 1.4- taking slow mag still and also Albumin was 3.5-nl    EKG today shows rate of 93 and NSR/no acute changes   Last cxr in  dec DG Chest 2 View (Accession [id number]) (Order [id number])  Imaging  Date: 01/19/2017 Department: [facility] Released By: Ellamae Sia Authorizing: Venia Carbon, MD  Exam Information   Status Exam Begun  Exam Ended   Final [99] 01/19/2017 8:55 AM 01/19/2017 9:01 AM  PACS Images   Show images for DG Chest 2 View  Study Result   CLINICAL DATA:  Right lower lobe crackles, tachypnea  EXAM: CHEST  2 VIEW  COMPARISON:  04/10/2016  FINDINGS: Right Port-A-Cath remains in place, unchanged. Heart is upper limits normal in size. No confluent opacities or effusions. No acute bony abnormality.  IMPRESSION: No active cardiopulmonary disease.   Electronically Signed   By: Rolm Baptise M.D.   On: 01/19/2017 09:21    Patient Active Problem List   Diagnosis Date Noted  . Preoperative examination 04/27/2017  . Carpal tunnel syndrome of right wrist 11/13/2016  . Chemotherapy-induced neuropathy ([place]) 11/13/2016  . Obstructive sleep apnea 01/01/2016  . Carcinoma of upper-outer quadrant of left breast in female, estrogen receptor negative ([place])  11/21/2015  . Hypomagnesemia 08/28/2015  . Initial Medicare annual wellness visit 10/16/2014  . Estrogen deficiency 10/16/2014  . Colon cancer screening 10/16/2014  . Controlled type 2 diabetes mellitus without complication ([place]) 85/46/2703  . RLS (restless legs syndrome) 02/21/2014  . Chronic neck pain 07/25/2010  . Depression 07/07/2010  . Routine general medical examination at a health care facility 06/26/2010  . B12 deficiency 09/04/2009  . Anemia, iron deficiency 08/29/2009  . Anxiety state 08/26/2009  . GASTROPARESIS 08/26/2009  . Vitamin D deficiency 07/19/2008  . Disorder of bone and cartilage 07/19/2007  . Hyperlipidemia 07/18/2007  . EDEMA 07/18/2007   Past Medical History:  Diagnosis Date  . Anemia    iron deficiency and B12 deficiency  . Anxiety   . Breast cancer ([place]) 02/25/2015     upper-outer quadrant of left female breast, Triple Negative. Neo-adjuvant chemo with complete pathologic response.   . Cervical dysplasia    conization  . Diabetes mellitus without complication ([place])   . Diverticulosis   . Fever 04/11/2015  . Gastroparesis    related to previous radiation tx  . GERD (gastroesophageal reflux disease)   . Hyperlipidemia   . Hypertension   . Shingles    Hx of  . 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 APPENDECTOMY    . BREAST BIOPSY Left 02/25/2015   INVASIVE MAMMARY CARCINOMA,Triple negative.  [name] BREAST CYST ASPIRATION    . BREAST EXCISIONAL BIOPSY Left 12/2015   surgery  . BREAST LUMPECTOMY WITH NEEDLE LOCALIZATION Left 10/02/2015   Procedure: BREAST LUMPECTOMY WITH NEEDLE LOCALIZATION;  Surgeon: [name], MD;  Location: [facility] ORS;  Service: General;  Laterality: Left;  . BREAST LUMPECTOMY WITH SENTINEL LYMPH NODE BIOPSY Left 10/02/2015   Procedure: LEFT BREAST WIDE EXCISION WITH SENTINEL LYMPH NODE BX;  Surgeon: [name], MD;  Location: [facility] ORS;  Service: General;  Laterality: Left;  . BREAST SURGERY     breast biopsy benign  . CHOLECYSTECTOMY    . Cologuard  07/22/2016   Negative  . ESOPHAGOGASTRODUODENOSCOPY  07/2009   normal with few gastric polyps  . MOLE REMOVAL    . PORT-A-CATH REMOVAL    . PORTACATH PLACEMENT    . PORTACATH PLACEMENT Right 03/12/2015   Procedure: INSERTION PORT-A-CATH;  Surgeon: [name], MD;  Location: [facility] ORS;  Service: General;  Laterality: Right;  . RADICAL NECK DISSECTION    . TONSILLECTOMY     cancer treated with chemo and radiation   Social History   Tobacco Use  . Smoking status: Former Smoker    Packs/day: 0.50    Years: 6.00    Pack years: 3.00    Types: Cigarettes    Last attempt to quit: 03/10/1984    Years since quitting: 33.1  . Smokeless tobacco: Never Used  Substance Use Topics  . Alcohol use: No    Alcohol/week: 0.0 oz   . Drug use: No   Family History  Problem Relation Age of Onset  . Osteoporosis Mother   . Hyperlipidemia Mother   . Depression Mother   . Cancer Mother        skin cancer ? basal cell and lung ca heavy smoker  . Alcohol abuse Father   . Cancer Father        skin CA ? basal cell  . Heart disease Father        CHF  . Hyperlipidemia Brother   . Hypertension  Brother    Allergies  Allergen Reactions  . Amoxicillin     REACTION: rash  . Fluconazole     REACTION: hives   Current Outpatient Medications on File Prior to Visit  Medication Sig Dispense Refill  . ALPRAZolam (XANAX) 0.5 MG tablet Take 1 tablet (0.5 mg total) by mouth at bedtime as needed for sleep. 30 tablet 3  . amitriptyline (ELAVIL) 50 MG tablet Take 1 tablet (50 mg total) by mouth at bedtime. 90 tablet 3  . atorvastatin (LIPITOR) 10 MG tablet Take 1 tablet (10 mg total) by mouth daily. 30 tablet 11  . b complex vitamins tablet Take 1 tablet by mouth daily.      . cyanocobalamin (,VITAMIN B-12,) 1000 MCG/ML injection Inject 1,000 mcg into the muscle every 30 (thirty) days.      . fluticasone (FLONASE) 50 MCG/ACT nasal spray instill 2 sprays into each nostril once daily 16 g 11  . gabapentin (NEURONTIN) 300 MG capsule Take 2 capsules (600 mg total) by mouth 3 (three) times daily. 180 capsule 6  . glipiZIDE (GLUCOTROL) 10 MG tablet Take 10 mg by mouth daily before breakfast.     . JANUMET XR 50-1000 MG TB24 Take 1 tablet by mouth 2 (two) times daily.   0  . lidocaine-prilocaine (EMLA) cream Apply 1 application topically as needed. Apply to port when going through Chemo    . lisinopril (PRINIVIL,ZESTRIL) 5 MG tablet Take 1 tablet (5 mg total) by mouth daily. 30 tablet 11  . magnesium chloride (SLOW-MAG) 64 MG TBEC SR tablet Take 1 tablet by mouth.     . mirtazapine (REMERON) 30 MG tablet take 2 tablet by mouth at bedtime  0  . omeprazole (PRILOSEC) 20 MG capsule Take 1 capsule (20 mg total) by mouth daily. 30 capsule 11   . rOPINIRole (REQUIP) 1 MG tablet take 1 tablet by mouth every morning if needed and 3 tablets at bedtime 120 tablet 11  . sertraline (ZOLOFT) 100 MG tablet Take 200 mg by mouth daily.  0  . spironolactone (ALDACTONE) 25 MG tablet take 1 tablet by mouth once daily if needed 30 tablet 4  . vitamin B-12 (CYANOCOBALAMIN) 1000 MCG tablet Take by mouth.     Current Facility-Administered Medications on File Prior to Visit  Medication Dose Route Frequency Provider Last Rate Last Dose  . sodium chloride flush (NS) 0.9 % injection 10 mL  10 mL Intravenous PRN Cammie Sickle, MD   10 mL at 09/11/15 0845     Review of Systems  Constitutional: Negative for activity change, appetite change, fatigue, fever and unexpected weight change.  HENT: Negative for congestion, ear pain, rhinorrhea, sinus pressure and sore throat.   Eyes: Negative for pain, redness and visual disturbance.  Respiratory: Negative for cough, shortness of breath and wheezing.   Cardiovascular: Negative for chest pain and palpitations.  Gastrointestinal: Negative for abdominal pain, blood in stool, constipation and diarrhea.  Endocrine: Negative for polydipsia and polyuria.  Genitourinary: Negative for dysuria, frequency and urgency.  Musculoskeletal: Negative for arthralgias, back pain and myalgias.       Right hand pain and tingling   Skin: Negative for pallor and rash.  Allergic/Immunologic: Negative for environmental allergies.  Neurological: Positive for numbness. Negative for dizziness, syncope and headaches.       Neuropathy  Hematological: Negative for adenopathy. Does not bruise/bleed easily.  Psychiatric/Behavioral: Negative for decreased concentration and dysphoric mood. The patient is not nervous/anxious.  Objective:   Physical Exam  Constitutional: She appears well-developed and well-nourished. No distress.  obese and well appearing   HENT:  Head: Normocephalic and atraumatic.  Mouth/Throat:  Oropharynx is clear and moist.  Baseline dry mouth and anatomic post surgical changes in throat   Eyes: Conjunctivae and EOM are normal. Pupils are equal, round, and reactive to light.  Neck: Normal range of motion. Neck supple. No JVD present. Carotid bruit is not present. No thyromegaly present.  Baseline post surgical neck changes   Cardiovascular: Normal rate, regular rhythm, normal heart sounds and intact distal pulses. Exam reveals no gallop.  Pulmonary/Chest: Effort normal and breath sounds normal. No respiratory distress. She has no wheezes. She has no rales.  No crackles  Abdominal: Soft. Bowel sounds are normal. She exhibits no distension, no abdominal bruit and no mass. There is no tenderness. There is no rebound and no guarding.  Musculoskeletal: She exhibits no edema.  No kyphosis   Lymphadenopathy:    She has no cervical adenopathy.  Neurological: She is alert. She has normal reflexes. A sensory deficit is present. No cranial nerve deficit. She exhibits normal muscle tone. Coordination normal.  sens def in hands and feet  Skin: Skin is warm and dry. No rash noted.  Psychiatric: She has a normal mood and affect.          Assessment & Plan:   Problem List Items Addressed This Visit      Respiratory   Obstructive sleep apnea    Pt declines cpap  States her hand surgeon is aware        Endocrine   Controlled type 2 diabetes mellitus without complication ([place])    Per pt well controlled She will have her A1C with endocrinology soon /before hand surgery        Nervous and Auditory   Carpal tunnel syndrome of right wrist    Planning upcoming surgery       Chemotherapy-induced neuropathy ([place])    No changes  Her R CTS may worsen symptoms         Other   Anemia, iron deficiency    Continues f/u with hematology and surgery Neg GI w/u  May be due to prev chemo Her hand surgeon is aware       Hypomagnesemia    Now taking slow-mag      Preoperative  examination    Upcoming CTS sx in april Reassuring exam and EKG Rev last labs ua -leukocytes (sent for cx) no symptoms Aware of neck surgical changes (? If she will be intubated for this surgery) No anticoag No hx of anaphylaxis  No hx of trouble with anesthesia  Chronic problems are controlled  Aware -no cpap for OSA Anemia-baseline Throat surg changes  DM good control -A1C soon with endo  Overall I think she will do well with carpal tunnel surgery        Other Visit Diagnoses    Pre-operative cardiovascular examination    -  Primary   Relevant Orders   EKG 12-Lead (Completed)   Urinalysis Dipstick (Completed)   Abnormal urinalysis       Relevant Orders   Urine Culture

## 2017-04-27 NOTE — Assessment & Plan Note (Signed)
Pt declines cpap  States her hand surgeon is aware

## 2017-04-27 NOTE — Assessment & Plan Note (Signed)
Continues f/u with hematology and surgery Neg GI w/u  May be due to prev chemo Her hand surgeon is aware

## 2017-04-29 LAB — URINE CULTURE
MICRO NUMBER:: 90314598
SPECIMEN QUALITY:: ADEQUATE

## 2017-05-10 ENCOUNTER — Other Ambulatory Visit: Payer: Self-pay | Admitting: Specialist

## 2017-05-12 DIAGNOSIS — F411 Generalized anxiety disorder: Secondary | ICD-10-CM | POA: Diagnosis not present

## 2017-05-13 ENCOUNTER — Telehealth: Payer: Self-pay | Admitting: *Deleted

## 2017-05-13 ENCOUNTER — Encounter
Admission: RE | Admit: 2017-05-13 | Discharge: 2017-05-13 | Disposition: A | Discharge status: Home / Self Care | Payer: Medicare Other | Source: Ambulatory Visit | Attending: Specialist | Admitting: Specialist

## 2017-05-13 ENCOUNTER — Other Ambulatory Visit: Payer: Self-pay

## 2017-05-13 DIAGNOSIS — Z01818 Encounter for other preprocedural examination: Secondary | ICD-10-CM | POA: Insufficient documentation

## 2017-05-13 NOTE — Telephone Encounter (Signed)
Dr. Glori Bickers left message on pt's sleep study findings saying:  "I reviewed this, pt declined cpap machine. Based on results, I think she should try it. (please let her know) thanks.  CRM created and pt calls back please advise of Dr. Marliss Coots comment

## 2017-05-13 NOTE — Patient Instructions (Signed)
Your procedure is scheduled on: Wed. 05/19/17 Report to Day Surgery. To find out your arrival time please call [phone] between 1PM - 3PM on Tues 05/18/17.  Remember: Instructions that are not followed completely may result in serious medical risk, up to and including death, or upon the discretion of your surgeon and anesthesiologist your surgery may need to be rescheduled.     _X__ 1. Do not eat food after midnight the night before your procedure.                 No gum chewing or hard candies. You may drink clear liquids up to 2 hours                 before you are scheduled to arrive for your surgery- DO not drink clear                 liquids within 2 hours of the start of your surgery.                 Clear Liquids include:  water, apple juice without pulp, clear carbohydrate                 drink such as Clearfast of Gartorade, Black Coffee or Tea (Do not add                 anything to coffee or tea).  __X__2.  On the morning of surgery brush your teeth with toothpaste and water, you may rinse your mouth with mouthwash if you wish.  Do not swallow any              toothpaste of mouthwash.     ___ 3.  No Alcohol for 24 hours before or after surgery.   __ 4.  Do Not Smoke or use e-cigarettes For 24 Hours Prior to Your Surgery.                 Do not use any chewable tobacco products for at least 6 hours prior to                 surgery.  ____  5.  Bring all medications with you on the day of surgery if instructed.   __x__  6.  Notify your doctor if there is any change in your medical condition      (cold, fever, infections).     Do not wear jewelry, make-up, hairpins, clips or nail polish. Do not wear lotions, powders, or perfumes. You may wear deodorant. Do not shave 48 hours prior to surgery. Men may shave face and neck. Do not bring valuables to the hospital.    [facility] is not responsible for any belongings or valuables.  Contacts, dentures or  bridgework may not be worn into surgery. Leave your suitcase in the car. After surgery it may be brought to your room. For patients admitted to the hospital, discharge time is determined by your treatment team.   Patients discharged the day of surgery will not be allowed to drive home.   Please read over the following fact sheets that you were given:    __x__ Take these medicines the morning of surgery with A SIP OF WATER:    1. ALPRAZolam (XANAX) 0.5 MG tablet  2. gabapentin (NEURONTIN) 300 MG capsule  3. omeprazole (PRILOSEC) 20 MG capsule  4.sertraline (ZOLOFT) 100 MG tablet  5.  6.  ____ Fleet Enema (as directed)   __x__ Use  CHG Soap as directed  ____ Use inhalers on the day of surgery  __x__ Stop Janumet 2 days prior to surgery Last dose on Sunday   ____ Take 1/2 of usual insulin dose the night before surgery. No insulin the morning          of surgery.   ____ Stop Coumadin/Plavix/aspirin on   _x___ Stop Anti-inflammatories   May take tylenol.  No ibuprofen or Aleve or Aspirin   ____ Stop supplements until after surgery.    ____ Bring C-Pap to the hospital.

## 2017-05-13 NOTE — Telephone Encounter (Signed)
Pt refusing CPAP. Routing message back to office.

## 2017-05-18 MED ORDER — CLINDAMYCIN PHOSPHATE 600 MG/50ML IV SOLN
600.0000 mg | INTRAVENOUS | Status: AC
  Administered 2017-05-19: 600 mg via INTRAVENOUS

## 2017-05-19 ENCOUNTER — Encounter: Payer: Self-pay | Admitting: Certified Registered Nurse Anesthetist

## 2017-05-19 ENCOUNTER — Other Ambulatory Visit: Payer: Self-pay | Admitting: Specialist

## 2017-05-19 ENCOUNTER — Other Ambulatory Visit: Payer: Self-pay

## 2017-05-19 ENCOUNTER — Encounter
Admission: RE | Disposition: A | Discharge status: Home / Self Care | Payer: Self-pay | Source: Ambulatory Visit | Attending: Specialist

## 2017-05-19 ENCOUNTER — Ambulatory Visit: Payer: Medicare Other | Admitting: Certified Registered Nurse Anesthetist

## 2017-05-19 ENCOUNTER — Ambulatory Visit
Admission: RE | Admit: 2017-05-19 | Discharge: 2017-05-19 | Disposition: A | Discharge status: Home / Self Care | Payer: Medicare Other | Source: Ambulatory Visit | Attending: Specialist | Admitting: Specialist

## 2017-05-19 DIAGNOSIS — M65341 Trigger finger, right ring finger: Secondary | ICD-10-CM | POA: Diagnosis not present

## 2017-05-19 DIAGNOSIS — G4733 Obstructive sleep apnea (adult) (pediatric): Secondary | ICD-10-CM | POA: Diagnosis not present

## 2017-05-19 DIAGNOSIS — G5601 Carpal tunnel syndrome, right upper limb: Secondary | ICD-10-CM | POA: Insufficient documentation

## 2017-05-19 DIAGNOSIS — G4730 Sleep apnea, unspecified: Secondary | ICD-10-CM | POA: Diagnosis not present

## 2017-05-19 DIAGNOSIS — Z7951 Long term (current) use of inhaled steroids: Secondary | ICD-10-CM | POA: Insufficient documentation

## 2017-05-19 DIAGNOSIS — F418 Other specified anxiety disorders: Secondary | ICD-10-CM | POA: Insufficient documentation

## 2017-05-19 DIAGNOSIS — Z79899 Other long term (current) drug therapy: Secondary | ICD-10-CM | POA: Insufficient documentation

## 2017-05-19 DIAGNOSIS — E119 Type 2 diabetes mellitus without complications: Secondary | ICD-10-CM | POA: Diagnosis not present

## 2017-05-19 DIAGNOSIS — Z7984 Long term (current) use of oral hypoglycemic drugs: Secondary | ICD-10-CM | POA: Insufficient documentation

## 2017-05-19 DIAGNOSIS — I10 Essential (primary) hypertension: Secondary | ICD-10-CM | POA: Diagnosis not present

## 2017-05-19 DIAGNOSIS — E785 Hyperlipidemia, unspecified: Secondary | ICD-10-CM | POA: Diagnosis not present

## 2017-05-19 DIAGNOSIS — K219 Gastro-esophageal reflux disease without esophagitis: Secondary | ICD-10-CM | POA: Diagnosis not present

## 2017-05-19 HISTORY — PX: CARPAL TUNNEL RELEASE: SHX101

## 2017-05-19 HISTORY — PX: TRIGGER FINGER RELEASE: SHX641

## 2017-05-19 LAB — GLUCOSE, CAPILLARY
Glucose-Capillary: 140 mg/dL — ABNORMAL HIGH (ref 65–99)
Glucose-Capillary: 145 mg/dL — ABNORMAL HIGH (ref 65–99)

## 2017-05-19 SURGERY — CARPAL TUNNEL RELEASE
Anesthesia: General | Site: Finger | Laterality: Right

## 2017-05-19 MED ORDER — ONDANSETRON HCL 4 MG/2ML IJ SOLN
INTRAMUSCULAR | Status: AC
Start: 2017-05-19 — End: ?
  Filled 2017-05-19: qty 2

## 2017-05-19 MED ORDER — PROPOFOL 10 MG/ML IV BOLUS
INTRAVENOUS | Status: AC
Start: 2017-05-19 — End: ?
  Filled 2017-05-19: qty 20

## 2017-05-19 MED ORDER — MELOXICAM 7.5 MG PO TABS
ORAL_TABLET | ORAL | Status: AC
  Filled 2017-05-19: qty 2

## 2017-05-19 MED ORDER — FENTANYL CITRATE (PF) 100 MCG/2ML IJ SOLN: 25.0000 ug | INTRAMUSCULAR | Status: DC | PRN

## 2017-05-19 MED ORDER — MELOXICAM 7.5 MG PO TABS: 7.5000 mg | ORAL_TABLET | Freq: Every day | ORAL | 2 refills | Status: DC

## 2017-05-19 MED ORDER — PROPOFOL 10 MG/ML IV BOLUS
INTRAVENOUS | Status: DC | PRN
  Administered 2017-05-19: 50 mg via INTRAVENOUS
  Administered 2017-05-19: 150 mg via INTRAVENOUS

## 2017-05-19 MED ORDER — MIDAZOLAM HCL 2 MG/2ML IJ SOLN
INTRAMUSCULAR | Status: DC | PRN
  Administered 2017-05-19: 2 mg via INTRAVENOUS

## 2017-05-19 MED ORDER — HYDROCODONE-ACETAMINOPHEN 5-325 MG PO TABS: 1.0000 | ORAL_TABLET | Freq: Four times a day (QID) | ORAL | 0 refills | Status: DC | PRN

## 2017-05-19 MED ORDER — DEXAMETHASONE SODIUM PHOSPHATE 10 MG/ML IJ SOLN
INTRAMUSCULAR | Status: DC | PRN
  Administered 2017-05-19: 10 mg via INTRAVENOUS

## 2017-05-19 MED ORDER — ONDANSETRON HCL 4 MG/2ML IJ SOLN
INTRAMUSCULAR | Status: DC | PRN
  Administered 2017-05-19: 4 mg via INTRAVENOUS

## 2017-05-19 MED ORDER — SODIUM CHLORIDE 0.9 % IV SOLN
INTRAVENOUS | Status: DC
  Administered 2017-05-19: 11:00:00 via INTRAVENOUS

## 2017-05-19 MED ORDER — CHLORHEXIDINE GLUCONATE CLOTH 2 % EX PADS: 6.0000 | MEDICATED_PAD | Freq: Once | CUTANEOUS | Status: DC

## 2017-05-19 MED ORDER — MELOXICAM 7.5 MG PO TABS
15.0000 mg | ORAL_TABLET | ORAL | Status: AC
  Administered 2017-05-19: 15 mg via ORAL

## 2017-05-19 MED ORDER — CEFAZOLIN SODIUM-DEXTROSE 2-4 GM/100ML-% IV SOLN: 2.0000 g | INTRAVENOUS | Status: DC

## 2017-05-19 MED ORDER — BUPIVACAINE HCL 0.5 % IJ SOLN
INTRAMUSCULAR | Status: DC | PRN
  Administered 2017-05-19: 10 mL

## 2017-05-19 MED ORDER — LIDOCAINE HCL (PF) 2 % IJ SOLN
INTRAMUSCULAR | Status: AC
  Filled 2017-05-19: qty 10

## 2017-05-19 MED ORDER — CLINDAMYCIN PHOSPHATE 600 MG/50ML IV SOLN
INTRAVENOUS | Status: AC
  Filled 2017-05-19: qty 50

## 2017-05-19 MED ORDER — LIDOCAINE HCL (CARDIAC) 20 MG/ML IV SOLN
INTRAVENOUS | Status: DC | PRN
  Administered 2017-05-19 (×2): 40 mg via INTRAVENOUS

## 2017-05-19 MED ORDER — FENTANYL CITRATE (PF) 100 MCG/2ML IJ SOLN
INTRAMUSCULAR | Status: DC | PRN
  Administered 2017-05-19 (×4): 25 ug via INTRAVENOUS

## 2017-05-19 MED ORDER — ONDANSETRON HCL 4 MG/2ML IJ SOLN: 4.0000 mg | Freq: Once | INTRAMUSCULAR | Status: DC | PRN

## 2017-05-19 MED ORDER — FENTANYL CITRATE (PF) 100 MCG/2ML IJ SOLN
INTRAMUSCULAR | Status: AC
  Filled 2017-05-19: qty 2

## 2017-05-19 MED ORDER — HEPARIN SOD (PORK) LOCK FLUSH 100 UNIT/ML IV SOLN
INTRAVENOUS | Status: AC
  Filled 2017-05-19: qty 5

## 2017-05-19 MED ORDER — MIDAZOLAM HCL 2 MG/2ML IJ SOLN
INTRAMUSCULAR | Status: AC
  Filled 2017-05-19: qty 2

## 2017-05-19 MED ORDER — DEXAMETHASONE SODIUM PHOSPHATE 10 MG/ML IJ SOLN
INTRAMUSCULAR | Status: AC
  Filled 2017-05-19: qty 1

## 2017-05-19 SURGICAL SUPPLY — 27 items
BLADE SURG MINI STRL (BLADE) ×4 IMPLANT
BNDG ESMARK 4X12 TAN STRL LF (GAUZE/BANDAGES/DRESSINGS) ×4 IMPLANT
CANISTER SUCT 1200ML W/VALVE (MISCELLANEOUS) ×4 IMPLANT
CHLORAPREP W/TINT 26ML (MISCELLANEOUS) ×4 IMPLANT
CUFF TOURN 18 STER (MISCELLANEOUS) ×2 IMPLANT
ELECT REM PT RETURN 9FT ADLT (ELECTROSURGICAL) ×4
ELECTRODE REM PT RTRN 9FT ADLT (ELECTROSURGICAL) ×2 IMPLANT
GAUZE FLUFF 18X24 1PLY STRL (GAUZE/BANDAGES/DRESSINGS) ×2 IMPLANT
GAUZE PETRO XEROFOAM 1X8 (MISCELLANEOUS) ×4 IMPLANT
GLOVE BIO SURGEON STRL SZ8 (GLOVE) ×4 IMPLANT
GOWN STRL REUS W/ TWL LRG LVL3 (GOWN DISPOSABLE) ×2 IMPLANT
GOWN STRL REUS W/TWL LRG LVL3 (GOWN DISPOSABLE) ×2
GOWN STRL REUS W/TWL LRG LVL4 (GOWN DISPOSABLE) ×4 IMPLANT
KIT TURNOVER KIT A (KITS) ×4 IMPLANT
NS IRRIG 500ML POUR BTL (IV SOLUTION) ×4 IMPLANT
PACK EXTREMITY ARMC (MISCELLANEOUS) ×4 IMPLANT
PAD PREP 24X41 OB/GYN DISP (PERSONAL CARE ITEMS) ×4 IMPLANT
PADDING CAST 4IN STRL (MISCELLANEOUS) ×2
PADDING CAST BLEND 4X4 STRL (MISCELLANEOUS) ×2 IMPLANT
SPLINT CAST 1 STEP 3X12 (MISCELLANEOUS) ×4 IMPLANT
STOCKINETTE 48X4 2 PLY STRL (GAUZE/BANDAGES/DRESSINGS) ×2 IMPLANT
STOCKINETTE BIAS CUT 4 980044 (GAUZE/BANDAGES/DRESSINGS) ×4 IMPLANT
STOCKINETTE STRL 4IN 9604848 (GAUZE/BANDAGES/DRESSINGS) ×4 IMPLANT
SUT ETHILON 4-0 (SUTURE) ×2
SUT ETHILON 4-0 FS2 18XMFL BLK (SUTURE) ×2
SUT ETHILON 5-0 FS-2 18 BLK (SUTURE) ×4 IMPLANT
SUTURE ETHLN 4-0 FS2 18XMF BLK (SUTURE) ×2 IMPLANT

## 2017-05-19 NOTE — H&P (Signed)
THE PATIENT WAS SEEN PRIOR TO SURGERY TODAY.  HISTORY, ALLERGIES, HOME MEDICATIONS AND OPERATIVE PROCEDURE WERE REVIEWED. RISKS AND BENEFITS OF SURGERY DISCUSSED WITH PATIENT AGAIN.  NO CHANGES FROM INITIAL HISTORY AND PHYSICAL NOTED.    

## 2017-05-19 NOTE — Anesthesia Post-op Follow-up Note (Signed)
Anesthesia QCDR form completed.        

## 2017-05-19 NOTE — Anesthesia Procedure Notes (Signed)
Procedure Name: LMA Insertion Date/Time: 05/19/2017 11:22 AM Performed by: Eben Burow, CRNA Pre-anesthesia Checklist: Patient identified, Emergency Drugs available, Suction available, Patient being monitored and Timeout performed Patient Re-evaluated:Patient Re-evaluated prior to induction Oxygen Delivery Method: Circle system utilized Preoxygenation: Pre-oxygenation with 100% oxygen Induction Type: IV induction Ventilation: Mask ventilation without difficulty LMA: LMA inserted LMA Size: 4.0 Number of attempts: 3 Placement Confirmation: positive ETCO2 and breath sounds checked- equal and bilateral Tube secured with: Tape Dental Injury: Teeth and Oropharynx as per pre-operative assessment  Comments: Difficulty with getting LMA to sit properly due to previous oropharyngeal surgery and small mouth opening.

## 2017-05-19 NOTE — Transfer of Care (Signed)
Immediate Anesthesia Transfer of Care Note  Patient: Suzanne Strong  Procedure(s) Performed: CARPAL TUNNEL RELEASE (Right ) RELEASE TRIGGER FINGER/A-1 PULLEY ring finger (Right Finger)  Patient Location: PACU  Anesthesia Type:General  Level of Consciousness: awake, alert , oriented and patient cooperative  Airway & Oxygen Therapy: Patient Spontanous Breathing and Patient connected to face mask oxygen  Post-op Assessment: Report given to RN and Post -op Vital signs reviewed and stable  Post vital signs: Reviewed and stable  Last Vitals:  Vitals Value Taken Time  BP 197/89 05/19/2017 12:12 PM  Temp    Pulse 97 05/19/2017 12:13 PM  Resp 22 05/19/2017 12:13 PM  SpO2 100 % 05/19/2017 12:13 PM  Vitals shown include unvalidated device data.  Last Pain:  Vitals:   05/19/17 0958  TempSrc: Oral         Complications: No apparent anesthesia complications

## 2017-05-19 NOTE — Discharge Instructions (Signed)

## 2017-05-19 NOTE — Anesthesia Postprocedure Evaluation (Signed)
Anesthesia Post Note  Patient: Suzanne Strong  Procedure(s) Performed: CARPAL TUNNEL RELEASE (Right ) RELEASE TRIGGER FINGER/A-1 PULLEY ring finger (Right Finger)  Patient location during evaluation: PACU Anesthesia Type: General Level of consciousness: awake and alert Pain management: pain level controlled Vital Signs Assessment: post-procedure vital signs reviewed and stable Respiratory status: spontaneous breathing and respiratory function stable Cardiovascular status: stable Anesthetic complications: no     Last Vitals:  Vitals:   05/19/17 1303 05/19/17 1337  BP: (!) 172/74 (!) 149/71  Pulse: 83 82  Resp: 18 16  Temp: 36.6 C   SpO2: 97% 97%    Last Pain:  Vitals:   05/19/17 1337  TempSrc:   PainSc: 0-No pain                 KEPHART,WILLIAM K

## 2017-05-19 NOTE — Op Note (Signed)
05/19/2017  12:02 PM  PATIENT:  Suzanne Strong    PRE-OPERATIVE DIAGNOSIS: RIGHT CARPAL TUNNEL SYNDROME          RIGHT RING FINGER TRIGGERING  POST-OPERATIVE DIAGNOSIS: RIGHT CARPAL TUNNEL SYNDROME                                                        RIGHT RING FINGER TRIGGERING  PROCEDURE:  RIGHT CARPAL TUNNEL RELEASE                            RIGHT RING FINGER RELEASE A-1 PULLEY SURGEON: Bertel Venard E Kaziyah Parkison, MD    ANESTHESIA:   General  TOURNIQUET TIME: 23   MIN  PREOPERATIVE INDICATIONS:  Suzanne Strong is a  72 y.o. female with a diagnosis of right carpal tunnel syndrome who failed conservative measures and elected for surgical management.    The risks benefits and alternatives were discussed with the patient preoperatively including but not limited to the risks of infection, bleeding, nerve injury, incomplete relief of symptoms, pillar pain, cardiopulmonary complications, the need for revision surgery, among others, and the patient was willing to proceed.  OPERATIVE FINDINGS: Thickened volar ligament and nerve compression.                                          Synovitis right ring finger flexor sheath  OPERATIVE PROCEDURE: The patient is brought to the operating room placed in the supine position. General anesthesia was administered. The right upper extremity was prepped and draped in usual sterile fashion. Time out was performed. The arm was elevated and exsanguinated and the tourniquet was inflated.  A transverse inferior incision was made over the A1 pulley of the right ring finger in the palm.  Dissection was carried out bluntly in the midline exposing the flexor tendon sheath.  Retractors were inserted to protect the neurovascular bundles.  The A1 pulley was excised completely.  Sheath was opened proximally and distally.  The Vitas which was excised.  Removed smoothly.  Wound was irrigated and closed with 5-0 nylon suture.  An incision was made in line with the radial  border of the ring finger. The carpal tunnel transverse fascia was identified, cleaned, and incised sharply. The common sensory branches were visualized along with the superficial palmar arch and protected.  The median nerve was protected below  A Kelly clamp was  placed underneath the transverse carpal ligament, protecting the nerve. I released the ligament completely, and then released the proximal distal volar forearm fascia. The nerve was identified, and visualized, and protected throughout the case. The motor branch was intact upon inspection.  No masses or abnormalities were identified in ulnar bursa.  The wounds were irrigated copiously, and the skin closed with nylon. The wound was injected with 1/2% marcaine followed by a sterile dressing and  volar splint .  Tourniquet was deflated with good return of blood flow to all fingers. Sponge and needle counts were correct.  The patient tolerated this well, with no complications. The patient was awakened and taken to recovery in good condition.

## 2017-05-19 NOTE — Anesthesia Preprocedure Evaluation (Signed)
Anesthesia Evaluation  Patient identified by MRN, date of birth, ID band Patient awake    Reviewed: Allergy & Precautions, NPO status , Patient's Chart, lab work & pertinent test results  History of Anesthesia Complications Negative for: history of anesthetic complications  Airway Mallampati: III       Dental  (+) Partial Lower   Pulmonary sleep apnea (borderline, no CPAP) , neg COPD, former smoker,           Cardiovascular hypertension, Pt. on medications (-) Past MI and (-) CHF (-) dysrhythmias (-) Valvular Problems/Murmurs     Neuro/Psych neg Seizures Anxiety Depression    GI/Hepatic Neg liver ROS, GERD  Medicated and Controlled,  Endo/Other  diabetes, Type 2, Oral Hypoglycemic Agents  Renal/GU negative Renal ROS     Musculoskeletal   Abdominal   Peds  Hematology   Anesthesia Other Findings   Reproductive/Obstetrics                             Anesthesia Physical Anesthesia Plan  ASA: III  Anesthesia Plan: General   Post-op Pain Management:    Induction: Intravenous  PONV Risk Score and Plan: 3 and Dexamethasone, Ondansetron and Midazolam  Airway Management Planned: LMA  Additional Equipment:   Intra-op Plan:   Post-operative Plan:   Informed Consent: I have reviewed the patients History and Physical, chart, labs and discussed the procedure including the risks, benefits and alternatives for the proposed anesthesia with the patient or authorized representative who has indicated his/her understanding and acceptance.     Plan Discussed with:   Anesthesia Plan Comments:         Anesthesia Quick Evaluation

## 2017-06-09 DIAGNOSIS — E119 Type 2 diabetes mellitus without complications: Secondary | ICD-10-CM | POA: Diagnosis not present

## 2017-06-11 ENCOUNTER — Inpatient Hospital Stay: Payer: Medicare Other | Attending: Internal Medicine

## 2017-06-11 DIAGNOSIS — Z95828 Presence of other vascular implants and grafts: Secondary | ICD-10-CM

## 2017-06-11 DIAGNOSIS — Z171 Estrogen receptor negative status [ER-]: Secondary | ICD-10-CM | POA: Diagnosis not present

## 2017-06-11 DIAGNOSIS — Z452 Encounter for adjustment and management of vascular access device: Secondary | ICD-10-CM | POA: Diagnosis not present

## 2017-06-11 DIAGNOSIS — C50412 Malignant neoplasm of upper-outer quadrant of left female breast: Secondary | ICD-10-CM | POA: Insufficient documentation

## 2017-06-11 MED ORDER — HEPARIN SOD (PORK) LOCK FLUSH 100 UNIT/ML IV SOLN
500.0000 [IU] | INTRAVENOUS | Status: AC | PRN
  Administered 2017-06-11: 500 [IU]

## 2017-06-11 MED ORDER — SODIUM CHLORIDE 0.9% FLUSH
10.0000 mL | INTRAVENOUS | Status: AC | PRN
  Administered 2017-06-11: 10 mL
  Filled 2017-06-11: qty 10

## 2017-06-16 DIAGNOSIS — E1165 Type 2 diabetes mellitus with hyperglycemia: Secondary | ICD-10-CM | POA: Diagnosis not present

## 2017-06-16 DIAGNOSIS — C801 Malignant (primary) neoplasm, unspecified: Secondary | ICD-10-CM | POA: Insufficient documentation

## 2017-06-27 ENCOUNTER — Other Ambulatory Visit: Payer: Self-pay | Admitting: Family Medicine

## 2017-06-27 DIAGNOSIS — D509 Iron deficiency anemia, unspecified: Secondary | ICD-10-CM

## 2017-06-27 DIAGNOSIS — E559 Vitamin D deficiency, unspecified: Secondary | ICD-10-CM

## 2017-06-27 DIAGNOSIS — E119 Type 2 diabetes mellitus without complications: Secondary | ICD-10-CM

## 2017-06-27 DIAGNOSIS — E538 Deficiency of other specified B group vitamins: Secondary | ICD-10-CM

## 2017-06-27 DIAGNOSIS — E7800 Pure hypercholesterolemia, unspecified: Secondary | ICD-10-CM

## 2017-06-27 NOTE — Telephone Encounter (Signed)
-----   Message from Eustace Pen, LPN sent at [id number]/9622  4:49 PM EDT ----- Regarding: Labs 5/13 Lab orders needed. Thank you.  Insurance:  [employer]

## 2017-06-29 ENCOUNTER — Ambulatory Visit: Payer: Medicare Other

## 2017-06-29 ENCOUNTER — Ambulatory Visit (INDEPENDENT_AMBULATORY_CARE_PROVIDER_SITE_OTHER): Payer: Medicare Other

## 2017-06-29 VITALS — BP 94/52 | HR 78 | Temp 98.0°F | Ht 65.5 in | Wt 229.0 lb

## 2017-06-29 DIAGNOSIS — Z0000 Encounter for general adult medical examination without abnormal findings: Secondary | ICD-10-CM

## 2017-06-29 DIAGNOSIS — E559 Vitamin D deficiency, unspecified: Secondary | ICD-10-CM

## 2017-06-29 DIAGNOSIS — E538 Deficiency of other specified B group vitamins: Secondary | ICD-10-CM | POA: Diagnosis not present

## 2017-06-29 DIAGNOSIS — E119 Type 2 diabetes mellitus without complications: Secondary | ICD-10-CM | POA: Diagnosis not present

## 2017-06-29 DIAGNOSIS — D509 Iron deficiency anemia, unspecified: Secondary | ICD-10-CM | POA: Diagnosis not present

## 2017-06-29 DIAGNOSIS — E7800 Pure hypercholesterolemia, unspecified: Secondary | ICD-10-CM

## 2017-06-29 LAB — CBC WITH DIFFERENTIAL/PLATELET
BASOS PCT: 0.4 % (ref 0.0–3.0)
Basophils Absolute: 0 10*3/uL (ref 0.0–0.1)
EOS ABS: 0.2 10*3/uL (ref 0.0–0.7)
Eosinophils Relative: 4.5 % (ref 0.0–5.0)
HCT: 31.6 % — ABNORMAL LOW (ref 36.0–46.0)
Hemoglobin: 10.3 g/dL — ABNORMAL LOW (ref 12.0–15.0)
LYMPHS ABS: 0.8 10*3/uL (ref 0.7–4.0)
Lymphocytes Relative: 18.1 % (ref 12.0–46.0)
MCHC: 32.4 g/dL (ref 30.0–36.0)
MCV: 82.9 fl (ref 78.0–100.0)
Monocytes Absolute: 0.3 10*3/uL (ref 0.1–1.0)
Monocytes Relative: 6.7 % (ref 3.0–12.0)
NEUTROS ABS: 3.2 10*3/uL (ref 1.4–7.7)
NEUTROS PCT: 70.3 % (ref 43.0–77.0)
PLATELETS: 227 10*3/uL (ref 150.0–400.0)
RBC: 3.82 Mil/uL — ABNORMAL LOW (ref 3.87–5.11)
RDW: 16.6 % — AB (ref 11.5–15.5)
WBC: 4.6 10*3/uL (ref 4.0–10.5)

## 2017-06-29 LAB — COMPREHENSIVE METABOLIC PANEL
ALT: 13 U/L (ref 0–35)
AST: 15 U/L (ref 0–37)
Albumin: 3.5 g/dL (ref 3.5–5.2)
Alkaline Phosphatase: 85 U/L (ref 39–117)
BUN: 17 mg/dL (ref 6–23)
CALCIUM: 9.1 mg/dL (ref 8.4–10.5)
CHLORIDE: 104 meq/L (ref 96–112)
CO2: 27 meq/L (ref 19–32)
CREATININE: 0.88 mg/dL (ref 0.40–1.20)
GFR: 67.61 mL/min (ref >60.00)
Glucose, Bld: 136 mg/dL — ABNORMAL HIGH (ref 70–99)
Potassium: 4.7 mEq/L (ref 3.5–5.1)
SODIUM: 138 meq/L (ref 135–145)
Total Bilirubin: 0.2 mg/dL (ref 0.2–1.2)
Total Protein: 7 g/dL (ref 6.0–8.3)

## 2017-06-29 LAB — LIPID PANEL
CHOLESTEROL: 156 mg/dL (ref 0–200)
HDL: 42.7 mg/dL (ref >39.00)
NonHDL: 113.58
Total CHOL/HDL Ratio: 4
Triglycerides: 205 mg/dL — ABNORMAL HIGH (ref 0.0–149.0)
VLDL: 41 mg/dL — AB (ref 0.0–40.0)

## 2017-06-29 LAB — MAGNESIUM: Magnesium: 1.2 mg/dL — ABNORMAL LOW (ref 1.5–2.5)

## 2017-06-29 LAB — HEMOGLOBIN A1C: HEMOGLOBIN A1C: 8 % — AB (ref 4.6–6.5)

## 2017-06-29 LAB — LDL CHOLESTEROL, DIRECT: Direct LDL: 97 mg/dL

## 2017-06-29 LAB — VITAMIN D 25 HYDROXY (VIT D DEFICIENCY, FRACTURES): VITD: 31.01 ng/mL (ref 30.00–100.00)

## 2017-06-29 LAB — VITAMIN B12: Vitamin B-12: 514 pg/mL (ref 211–911)

## 2017-06-29 LAB — TSH: TSH: 3.29 u[IU]/mL (ref 0.35–4.50)

## 2017-06-29 NOTE — Progress Notes (Signed)
PCP notes:   Health maintenance:  Eye exam - addressed A1C - completed  Abnormal screenings:   Hearing - failed  Hearing Screening   125Hz  250Hz  500Hz  1000Hz  2000Hz  3000Hz  4000Hz  6000Hz  8000Hz   Right ear:   40 40 40  0    Left ear:   40 40 40  0     Patient concerns:   None  Nurse concerns:  None  Next PCP appt:   07/05/17 @ 0930  I reviewed health advisor's note, was available for consultation, and agree with documentation and plan. [name] MD

## 2017-06-29 NOTE — Progress Notes (Signed)
Subjective:   Suzanne Strong is a 72 y.o. female who presents for Medicare Annual (Subsequent) preventive examination.  Review of Systems:  N/A Cardiac Risk Factors include: advanced age (>55men, >67 women);obesity (BMI >30kg/m2);dyslipidemia;diabetes mellitus     Objective:     Vitals: BP (!) 94/52 (BP Location: Right Arm, Patient Position: Sitting, Cuff Size: Normal)   Pulse 78   Temp 98 F (36.7 C) (Oral)   Ht 5' 5.5" (1.664 m) Comment: shoes  Wt 229 lb (103.9 kg)   SpO2 94%   BMI 37.53 kg/m   Body mass index is 37.53 kg/m.  Advanced Directives 06/29/2017 05/13/2017 04/20/2017 04/09/2017 12/07/2016 08/10/2016 06/25/2016  Does Patient Have a Medical Advance Directive? Yes No No No No No No  Type of Advance Directive [place]  Does patient want to make changes to medical advance directive? - - - No - Patient declined No - Patient declined No - Patient declined -  Copy of [place] in Chart? No - copy requested - - - - - -  Would patient like information on creating a medical advance directive? - No - Patient declined No - Patient declined No - Patient declined - No - Patient declined -    Tobacco Social History   Tobacco Use  Smoking Status Former Smoker  . Packs/day: 0.50  . Years: 6.00  . Pack years: 3.00  . Types: Cigarettes  . Last attempt to quit: 03/10/1984  . Years since quitting: 33.3  Smokeless Tobacco Never Used     Counseling given: No   Clinical Intake:  Pre-visit preparation completed: Yes  Pain : 0-10 Pain Score: 2  Pain Type: Acute pain Pain Location: Hand Pain Orientation: Right Pain Onset: More than a month ago Pain Frequency: Constant     Nutritional Status: BMI > 30  Obese Nutritional Risks: None Diabetes: Yes CBG done?: No Did pt. bring in CBG monitor from home?: No  How often do you need to have someone help you when you read instructions, pamphlets, or other written materials  from your doctor or pharmacy?: 1 - Never What is the last grade level you completed in school?: 12TH GRADE + 2 YR COLLEGE  Interpreter Needed?: No  Comments: PT LIVES WITH SPOUSE Information entered by :: LPINSON, LPN  Past Medical History:  Diagnosis Date  . Anemia    iron deficiency and B12 deficiency  . Anxiety   . Breast cancer ([place]) 02/25/2015   upper-outer quadrant of left female breast, Triple Negative. Neo-adjuvant chemo with complete pathologic response.   . Cervical dysplasia    conization  . Diabetes mellitus without complication ([place])   . Diverticulosis   . Fever 04/11/2015  . Gastroparesis    related to previous radiation tx  . GERD (gastroesophageal reflux disease)   . Hyperlipidemia   . Hypertension   . Shingles    Hx of  . 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 APPENDECTOMY    . BREAST BIOPSY Left 02/25/2015   INVASIVE MAMMARY CARCINOMA,Triple negative.  [name] BREAST CYST ASPIRATION    . BREAST EXCISIONAL BIOPSY Left 12/2015   surgery  . BREAST LUMPECTOMY WITH NEEDLE LOCALIZATION Left 10/02/2015   Procedure: BREAST LUMPECTOMY WITH NEEDLE LOCALIZATION;  Surgeon: [name], MD;  Location: [facility] ORS;  Service: General;  Laterality: Left;  . BREAST LUMPECTOMY WITH SENTINEL LYMPH NODE BIOPSY Left 10/02/2015  Procedure: LEFT BREAST WIDE EXCISION WITH SENTINEL LYMPH NODE BX;  Surgeon: [name], MD;  Location: [facility] ORS;  Service: General;  Laterality: Left;  . BREAST SURGERY     breast biopsy benign  . CARPAL TUNNEL RELEASE Right 05/19/2017   Procedure: CARPAL TUNNEL RELEASE;  Surgeon: Earnestine Leys, MD;  Location: [facility] ORS;  Service: Orthopedics;  Laterality: Right;  . CHOLECYSTECTOMY    . Cologuard  07/22/2016   Negative  . ESOPHAGOGASTRODUODENOSCOPY  07/2009   normal with few gastric polyps  . MOLE REMOVAL    . PORT-A-CATH REMOVAL    . PORTACATH PLACEMENT    . PORTACATH PLACEMENT Right 03/12/2015    Procedure: INSERTION PORT-A-CATH;  Surgeon: [name], MD;  Location: [facility] ORS;  Service: General;  Laterality: Right;  . RADICAL NECK DISSECTION    . TONSILLECTOMY     cancer treated with chemo and radiation  . TRIGGER FINGER RELEASE Right 05/19/2017   Procedure: RELEASE TRIGGER FINGER/A-1 PULLEY ring finger;  Surgeon: Earnestine Leys, MD;  Location: [facility] ORS;  Service: Orthopedics;  Laterality: Right;   Family History  Problem Relation Age of Onset  . Osteoporosis Mother   . Hyperlipidemia Mother   . Depression Mother   . Cancer Mother        skin cancer ? basal cell and lung ca heavy smoker  . Alcohol abuse Father   . Cancer Father        skin CA ? basal cell  . Heart disease Father        CHF  . Hyperlipidemia Brother   . Hypertension Brother    Social History   Socioeconomic History  . Marital status: Married    Spouse name: Not on file  . Number of children: 0  . Years of education: 78  . Highest education level: Not on file  Occupational History  . Occupation: Clinical cytogeneticist  Social Needs  . Financial resource strain: Not on file  . Food insecurity:    Worry: Not on file    Inability: Not on file  . Transportation needs:    Medical: Not on file    Non-medical: Not on file  Tobacco Use  . Smoking status: Former Smoker    Packs/day: 0.50    Years: 6.00    Pack years: 3.00    Types: Cigarettes    Last attempt to quit: 03/10/1984    Years since quitting: 33.3  . Smokeless tobacco: Never Used  Substance and Sexual Activity  . Alcohol use: No    Alcohol/week: 0.0 oz  . Drug use: No  . Sexual activity: Not on file  Lifestyle  . Physical activity:    Days per week: Not on file    Minutes per session: Not on file  . Stress: Not on file  Relationships  . Social connections:    Talks on phone: Not on file    Gets together: Not on file    Attends religious service: Not on file    Active member of club or organization: Not on file    Attends meetings of  clubs or organizations: Not on file    Relationship status: Not on file  Other Topics Concern  . Not on file  Social History Narrative   Lives with husband in a one story home.  No children.     Works as Radiation protection practitioner for [employer].     Education: college.     Outpatient Encounter Medications as of  06/29/2017  Medication Sig  . ALPRAZolam (XANAX) 0.5 MG tablet Take 1 tablet (0.5 mg total) by mouth at bedtime as needed for sleep. (Patient taking differently: Take 0.5-1 mg by mouth 2 (two) times daily as needed for anxiety or sleep. )  . amitriptyline (ELAVIL) 50 MG tablet Take 1 tablet (50 mg total) by mouth at bedtime.  [name] atorvastatin (LIPITOR) 10 MG tablet TAKE 1 TABLET BY MOUTH ONCE DAILY  . b complex vitamins tablet Take 1 tablet by mouth daily.    . cyanocobalamin (,VITAMIN B-12,) 1000 MCG/ML injection Inject 1,000 mcg into the muscle every 30 (thirty) days.    . fluticasone (FLONASE) 50 MCG/ACT nasal spray instill 2 sprays into each nostril once daily (Patient taking differently: instill 2 sprays into each nostril once daily as needed)  . gabapentin (NEURONTIN) 300 MG capsule Take 2 capsules (600 mg total) by mouth 3 (three) times daily.  [name] glipiZIDE (GLUCOTROL) 10 MG tablet Take 10 mg by mouth 2 (two) times daily before a meal.   . HYDROcodone-acetaminophen (NORCO) 5-325 MG tablet Take 1 tablet by mouth every 6 (six) hours as needed.  [name] JANUMET XR 50-1000 MG TB24 Take 1 tablet by mouth 2 (two) times daily.   [name] lidocaine-prilocaine (EMLA) cream Apply 1 application topically as needed. Apply to port when going through Chemo  . lisinopril (PRINIVIL,ZESTRIL) 5 MG tablet Take 1 tablet (5 mg total) by mouth daily.  . magnesium chloride (SLOW-MAG) 64 MG TBEC SR tablet Take 1 tablet by mouth daily as needed (if blood work shows magnesium is low).   . meloxicam (MOBIC) 7.5 MG tablet Take 1 tablet (7.5 mg total) by mouth daily.  . mirtazapine (REMERON) 30 MG tablet take 2 tablet by mouth at  bedtime  . omeprazole (PRILOSEC) 20 MG capsule Take 1 capsule (20 mg total) by mouth daily. (Patient taking differently: Take 20 mg by mouth at bedtime. )  . rOPINIRole (REQUIP) 1 MG tablet take 1 tablet by mouth every morning if needed and 3 tablets at bedtime (Patient taking differently: Take 3 mg by mouth See admin instructions. take 1 tablet by mouth every morning if needed and 3 tablets at bedtime each night)  . sertraline (ZOLOFT) 100 MG tablet Take 200 mg by mouth daily.  [name] spironolactone (ALDACTONE) 25 MG tablet Take 12.5 mg by mouth See admin instructions. Take 12.5 mg each morning, and 12.5 mg in the afternoon if needed for ankle swelling   Facility-Administered Encounter Medications as of 06/29/2017  Medication  . sodium chloride flush (NS) 0.9 % injection 10 mL    Activities of Daily Living In your present state of health, do you have any difficulty performing the following activities: 06/29/2017 05/13/2017  Hearing? N N  Vision? N N  Difficulty concentrating or making decisions? N N  Walking or climbing stairs? N N  Dressing or bathing? N N  Doing errands, shopping? N N  Preparing Food and eating ? N -  Using the Toilet? N -  In the past six months, have you accidently leaked urine? N -  Do you have problems with loss of bowel control? N -  Managing your Medications? N -  Managing your Finances? N -  Housekeeping or managing your Housekeeping? N -  Some recent data might be hidden    Patient Care Team: Tower, [name], MD as PCP - General Byrnett, [name], MD (General Surgery) [name], MD (Oncology)    Assessment:   This is  a routine wellness examination for [place].   Hearing Screening   125Hz  250Hz  500Hz  1000Hz  2000Hz  3000Hz  4000Hz  6000Hz  8000Hz   Right ear:   40 40 40  0    Left ear:   40 40 40  0    Vision Screening Comments: LAST VISION EXAM IN April 2018   Exercise Activities and Dietary recommendations Current Exercise Habits: Home exercise routine,  Type of exercise: walking, Time (Minutes): 45, Frequency (Times/Week): 4, Weekly Exercise (Minutes/Week): 180, Intensity: Mild, Exercise limited by: None identified  Goals    . Patient Stated     Starting 06/29/2017, in an effort to lose 20 lbs, I will reduce intake of added sugar and simple carbohydrates to my diet. Additionally, I plan to increase exercise activities.        Fall Risk Fall Risk  06/29/2017 11/13/2016 06/25/2016 02/24/2016 10/25/2015  Falls in the past year? No No No No No   Depression Screen PHQ 2/9 Scores 06/29/2017 06/25/2016 02/24/2016 10/25/2015  PHQ - 2 Score 0 0 0 0  PHQ- 9 Score 0 - - -     Cognitive Function MMSE - Mini Mental State Exam 06/29/2017 06/25/2016  Orientation to time 5 5  Orientation to Place 5 5  Registration 3 3  Attention/ Calculation 0 0  Recall 3 3  Language- name 2 objects 0 0  Language- repeat 1 1  Language- follow 3 step command 3 3  Language- read & follow direction 0 0  Write a sentence 0 0  Copy design 0 0  Total score 20 20     PLEASE NOTE: A Mini-Cog screen was completed. Maximum score is 20. A value of 0 denotes this part of [place] MMSE was not completed or the patient failed this part of the Mini-Cog screening.   Mini-Cog Screening Orientation to Time - Max 5 pts Orientation to Place - Max 5 pts Registration - Max 3 pts Recall - Max 3 pts Language Repeat - Max 1 pts Language Follow 3 Step Command - Max 3 pts     Immunization History  Administered Date(s) Administered  . Pneumococcal Conjugate-13 10/16/2014  . Pneumococcal Polysaccharide-23 08/31/2012  . Td 06/16/2001  . Tdap 06/01/2012    Screening Tests Health Maintenance  Topic Date Due  . OPHTHALMOLOGY EXAM  07/17/2018 (Originally 06/08/2017)  . INFLUENZA VACCINE  01/12/2021 (Originally 09/16/2017)  . FOOT EXAM  07/01/2017  . PNA vac Low Risk Adult (2 of 2 - PPSV23) 08/31/2017  . HEMOGLOBIN A1C  12/30/2017  . MAMMOGRAM  04/08/2019  . Fecal DNA (Cologuard)   07/23/2019  . TETANUS/TDAP  06/02/2022  . DEXA SCAN  Completed  . Hepatitis C Screening  Completed       Plan:   I have personally reviewed, addressed, and noted the following in the patient's chart:  A. Medical and social history B. Use of alcohol, tobacco or illicit drugs  C. Current medications and supplements D. Functional ability and status E.  Nutritional status F.  Physical activity G. Advance directives H. List of other physicians I.  Hospitalizations, surgeries, and ER visits in previous 12 months J.  [place] to include hearing, vision, cognitive, depression L. Referrals and appointments - none  In addition, I have reviewed and discussed with patient certain preventive protocols, quality metrics, and best practice recommendations. A written personalized care plan for preventive services as well as general preventive health recommendations were provided to patient.  See attached scanned questionnaire for additional information.  Signed,   Lindell Noe, MHA, BS, LPN Health Coach

## 2017-06-29 NOTE — Patient Instructions (Signed)
Suzanne Strong , Thank you for taking time to come for your Medicare Wellness Visit. I appreciate your ongoing commitment to your health goals. Please review the following plan we discussed and let me know if I can assist you in the future.   These are the goals we discussed: Goals    . Patient Stated     Starting 06/29/2017, in an effort to lose 20 lbs, I will reduce intake of added sugar and simple carbohydrates to my diet. Additionally, I plan to increase exercise activities.        This is a list of the screening recommended for you and due dates:  Health Maintenance  Topic Date Due  . Eye exam for diabetics  07/17/2018*  . Flu Shot  01/12/2021*  . Complete foot exam   07/01/2017  . Pneumonia vaccines (2 of 2 - PPSV23) 08/31/2017  . Hemoglobin A1C  12/30/2017  . Mammogram  04/08/2019  . Cologuard (Stool DNA test)  07/23/2019  . Tetanus Vaccine  06/02/2022  . DEXA scan (bone density measurement)  Completed  .  Hepatitis C: One time screening is recommended by Center for Disease Control  (CDC) for  adults born from [birth year] through 1963.   Completed  *Topic was postponed. The date shown is not the original due date.   Preventive Care for Adults  A healthy lifestyle and preventive care can promote health and wellness. Preventive health guidelines for adults include the following key practices.  . A routine yearly physical is a good way to check with your health care provider about your health and preventive screening. It is a chance to share any concerns and updates on your health and to receive a thorough exam.  . Visit your dentist for a routine exam and preventive care every 6 months. Brush your teeth twice a day and floss once a day. Good oral hygiene prevents tooth decay and gum disease.  . The frequency of eye exams is based on your age, health, family medical history, use  of contact lenses, and other factors. Follow your health care provider's recommendations for frequency of eye  exams.  . Eat a healthy diet. Foods like vegetables, fruits, whole grains, low-fat dairy products, and lean protein foods contain the nutrients you need without too many calories. Decrease your intake of foods high in solid fats, added sugars, and salt. Eat the right amount of calories for you. Get information about a proper diet from your health care provider, if necessary.  . Regular physical exercise is one of the most important things you can do for your health. Most adults should get at least 150 minutes of moderate-intensity exercise (any activity that increases your heart rate and causes you to sweat) each week. In addition, most adults need muscle-strengthening exercises on 2 or more days a week.  Silver Sneakers may be a benefit available to you. To determine eligibility, you may visit the website: www.silversneakers.com or contact program at (818) 868-5056 Mon-Fri between 8AM-8PM.   . Maintain a healthy weight. The body mass index (BMI) is a screening tool to identify possible weight problems. It provides an estimate of body fat based on height and weight. Your health care provider can find your BMI and can help you achieve or maintain a healthy weight.   For adults 20 years and older: ? A BMI below 18.5 is considered underweight. ? A BMI of 18.5 to 24.9 is normal. ? A BMI of 25 to 29.9 is considered overweight. ? A  BMI of 30 and above is considered obese.   . Maintain normal blood lipids and cholesterol levels by exercising and minimizing your intake of saturated fat. Eat a balanced diet with plenty of fruit and vegetables. Blood tests for lipids and cholesterol should begin at age 10 and be repeated every 5 years. If your lipid or cholesterol levels are high, you are over 50, or you are at high risk for heart disease, you may need your cholesterol levels checked more frequently. Ongoing high lipid and cholesterol levels should be treated with medicines if diet and exercise are not  working.  . If you smoke, find out from your health care provider how to quit. If you do not use tobacco, please do not start.  . If you choose to drink alcohol, please do not consume more than 2 drinks per day. One drink is considered to be 12 ounces (355 mL) of beer, 5 ounces (148 mL) of wine, or 1.5 ounces (44 mL) of liquor.  . If you are 68-63 years old, ask your health care provider if you should take aspirin to prevent strokes.  . Use sunscreen. Apply sunscreen liberally and repeatedly throughout the day. You should seek shade when your shadow is shorter than you. Protect yourself by wearing long sleeves, pants, a wide-brimmed hat, and sunglasses year round, whenever you are outdoors.  . Once a month, do a whole body skin exam, using a mirror to look at the skin on your back. Tell your health care provider of new moles, moles that have irregular borders, moles that are larger than a pencil eraser, or moles that have changed in shape or color.

## 2017-07-05 ENCOUNTER — Ambulatory Visit (INDEPENDENT_AMBULATORY_CARE_PROVIDER_SITE_OTHER): Payer: Medicare Other | Admitting: Family Medicine

## 2017-07-05 ENCOUNTER — Encounter: Payer: Self-pay | Admitting: Family Medicine

## 2017-07-05 VITALS — BP 122/60 | HR 88 | Temp 98.3°F | Ht 65.5 in | Wt 229.0 lb

## 2017-07-05 DIAGNOSIS — M949 Disorder of cartilage, unspecified: Secondary | ICD-10-CM

## 2017-07-05 DIAGNOSIS — E559 Vitamin D deficiency, unspecified: Secondary | ICD-10-CM

## 2017-07-05 DIAGNOSIS — E119 Type 2 diabetes mellitus without complications: Secondary | ICD-10-CM

## 2017-07-05 DIAGNOSIS — T451X5A Adverse effect of antineoplastic and immunosuppressive drugs, initial encounter: Secondary | ICD-10-CM | POA: Diagnosis not present

## 2017-07-05 DIAGNOSIS — G5601 Carpal tunnel syndrome, right upper limb: Secondary | ICD-10-CM

## 2017-07-05 DIAGNOSIS — G620 Drug-induced polyneuropathy: Secondary | ICD-10-CM

## 2017-07-05 DIAGNOSIS — Z1211 Encounter for screening for malignant neoplasm of colon: Secondary | ICD-10-CM

## 2017-07-05 DIAGNOSIS — D509 Iron deficiency anemia, unspecified: Secondary | ICD-10-CM | POA: Diagnosis not present

## 2017-07-05 DIAGNOSIS — M899 Disorder of bone, unspecified: Secondary | ICD-10-CM

## 2017-07-05 DIAGNOSIS — E7800 Pure hypercholesterolemia, unspecified: Secondary | ICD-10-CM | POA: Diagnosis not present

## 2017-07-05 DIAGNOSIS — E538 Deficiency of other specified B group vitamins: Secondary | ICD-10-CM | POA: Diagnosis not present

## 2017-07-05 MED ORDER — LISINOPRIL 5 MG PO TABS: 5.0000 mg | ORAL_TABLET | Freq: Every day | ORAL | 3 refills | Status: DC

## 2017-07-05 MED ORDER — OMEPRAZOLE 20 MG PO CPDR: 20.0000 mg | DELAYED_RELEASE_CAPSULE | Freq: Every day | ORAL | 3 refills | Status: DC

## 2017-07-05 MED ORDER — ROPINIROLE HCL 1 MG PO TABS: 3.0000 mg | ORAL_TABLET | ORAL | 3 refills | Status: DC

## 2017-07-05 MED ORDER — ATORVASTATIN CALCIUM 10 MG PO TABS: 10.0000 mg | ORAL_TABLET | Freq: Every day | ORAL | 3 refills | Status: DC

## 2017-07-05 MED ORDER — SPIRONOLACTONE 25 MG PO TABS: 12.5000 mg | ORAL_TABLET | Freq: Two times a day (BID) | ORAL | 3 refills | Status: DC | PRN

## 2017-07-05 NOTE — Assessment & Plan Note (Signed)
Sees endocrinology  Lab Results  Component Value Date   HGBA1C 8.0 (H) 06/29/2017   Disc eye and foot care Need for wt loss

## 2017-07-05 NOTE — Assessment & Plan Note (Signed)
dexa in 9/16-nl bmd  Disc inc D dose to 5000 iu  Disc need for calcium/ vitamin D/ wt bearing exercise and bone density test every 2 y to monitor Disc safety/ fracture risk in detail

## 2017-07-05 NOTE — Assessment & Plan Note (Signed)
Disc goals for lipids and reasons to control them Rev last labs with pt Rev low sat fat diet in detail  Atorvastatin and diet Will cut out bacon

## 2017-07-05 NOTE — Assessment & Plan Note (Signed)
Improved after surgery.

## 2017-07-05 NOTE — Progress Notes (Signed)
Subjective:    Patient ID: Suzanne Strong, female    DOB: [birth year], 72 y.o.   MRN: [id number]  HPI Here for annual f/u of chronic health problems   Has been doing well  Had some hand surgery- carpal tunnel and trigger finger (this helped neuropathy a bit also)     Wt Readings from Last 3 Encounters:  07/05/17 229 lb (103.9 kg)  06/29/17 229 lb (103.9 kg)  05/19/17 220 lb (99.8 kg)  stable Diet/exercise were not quite as good- getting back on track now  Neuropathy was her barrier  37.53 kg/m   Had amw on 5/14  Missed high tone -hearing screen both ears - not noticeable/ does not bother her  Side eff of tonsillar cancer- tinnitus   Eye exam 4/18- she just rec her notice to schedule it   Due for PPSV23 in July  Mammogram 2/19 neg  S/p L breast cancer tx with lumpect/chemo/rad Self breast exam -no lumps  Had 2 breast exams recently-both nl   Has neuropathy from chemo -hands improved/feet still bad Did an acupuncture study- did not help  dexa 9/16-nl BMD Vit D level is 31.0 She takes vit D daily - ? 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 Lab Results  Component Value Date   WBC 4.6 06/29/2017   HGB 10.3 (L) 06/29/2017   HCT 31.6 (L) 06/29/2017   MCV 82.9 06/29/2017   PLT 227.0 06/29/2017   fairly stable   B12 def Lab Results  Component Value Date   VITAMINB12 514 06/29/2017  gets B12 shots regularly   Blood pressure-up from last time  BP Readings from Last 3 Encounters:  07/05/17 140/70  06/29/17 (!) 94/52  05/19/17 (!) 149/71    Lab Results  Component Value Date   CREATININE 0.88 06/29/2017   BUN 17 06/29/2017   NA 138 06/29/2017   K 4.7  06/29/2017   CL 104 06/29/2017   CO2 27 06/29/2017   Lab Results  Component Value Date   ALT 13 06/29/2017   AST 15 06/29/2017   ALKPHOS 85 06/29/2017   BILITOT 0.2 06/29/2017    Hyperlipidemia Lab Results  Component Value Date   CHOL 156 06/29/2017   CHOL 156 06/25/2016   CHOL 173 09/18/2014   Lab Results  Component Value Date   HDL 42.70 06/29/2017   HDL 49.60 06/25/2016   HDL 50.40 09/18/2014   Lab Results  Component Value Date   LDLCALC 70 06/25/2016   LDLCALC 94 09/18/2014   LDLCALC 102 (H) 08/30/2013   Lab Results  Component Value Date   TRIG 205.0 (H) 06/29/2017   TRIG 183.0 (H) 06/25/2016   TRIG 144.0 09/18/2014   Lab Results  Component Value Date   CHOLHDL 4 06/29/2017   CHOLHDL 3 06/25/2016   CHOLHDL 3 09/18/2014   Lab Results  Component Value Date   LDLDIRECT 97.0 06/29/2017   LDLDIRECT 116.1 05/25/2012   LDLDIRECT 114.0 06/30/2010   Atorvastatin and diet  Diet got off track and her LDL went up from 70 to 97 -more fats  Too many  BLTs     Patient Active Problem List   Diagnosis Date Noted  . Medicare annual wellness visit, subsequent 06/29/2017  . Preoperative examination 04/27/2017  . Carpal tunnel syndrome of right wrist 11/13/2016  . Chemotherapy-induced neuropathy ([place]) 11/13/2016  . Obstructive sleep apnea 01/01/2016  . Carcinoma of upper-outer quadrant of left breast in female, estrogen receptor negative ([place]) 11/21/2015  . Hypomagnesemia 08/28/2015  . Initial Medicare annual wellness visit 10/16/2014  . Estrogen deficiency 10/16/2014  . Colon cancer screening 10/16/2014  . Controlled type 2 diabetes mellitus without complication ([place]) 93/81/0175  . RLS (restless legs syndrome) 02/21/2014  . Chronic neck pain 07/25/2010  . Depression 07/07/2010  . Routine general medical examination at a health care facility 06/26/2010  . B12 deficiency 09/04/2009  . Anemia, iron deficiency 08/29/2009  . Anxiety state 08/26/2009  .  GASTROPARESIS 08/26/2009  . Vitamin D deficiency 07/19/2008  . Disorder of bone and cartilage 07/19/2007  . Hyperlipidemia 07/18/2007  . EDEMA 07/18/2007   Past Medical History:  Diagnosis Date  . Anemia    iron deficiency and B12 deficiency  . Anxiety   . Breast cancer ([place]) 02/25/2015   upper-outer quadrant of left female breast, Triple Negative. Neo-adjuvant chemo with complete pathologic response.   . Cervical dysplasia    conization  . Diabetes mellitus without complication ([place])   . Diverticulosis   . Fever 04/11/2015  . Gastroparesis    related to previous radiation tx  . GERD (gastroesophageal reflux disease)   . Hyperlipidemia   . Hypertension   . Shingles    Hx of  . 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[place]) 2006   Past Surgical History:  Procedure Laterality Date  . APPENDECTOMY    . BREAST BIOPSY Left 02/25/2015   INVASIVE MAMMARY CARCINOMA,Triple negative.  [name] BREAST CYST ASPIRATION    . BREAST EXCISIONAL BIOPSY Left 12/2015   surgery  . BREAST LUMPECTOMY WITH NEEDLE LOCALIZATION Left 10/02/2015   Procedure: BREAST LUMPECTOMY WITH NEEDLE LOCALIZATION;  Surgeon: [name], MD;  Location: [facility];  Service: General;  Laterality: Left;  . BREAST LUMPECTOMY WITH SENTINEL LYMPH NODE BIOPSY Left 10/02/2015   Procedure: LEFT BREAST WIDE EXCISION WITH SENTINEL LYMPH NODE BX;  Surgeon: [name], MD;  Location: [facility];  Service: General;  Laterality: Left;  . BREAST SURGERY     breast biopsy benign  . CARPAL TUNNEL RELEASE Right 05/19/2017   Procedure: CARPAL TUNNEL RELEASE;  Surgeon: Earnestine Leys, MD;  Location: [facility];  Service: Orthopedics;  Laterality: Right;  . CHOLECYSTECTOMY    . Cologuard  07/22/2016   Negative  . ESOPHAGOGASTRODUODENOSCOPY  07/2009   normal with few gastric polyps  . MOLE REMOVAL    . PORT-A-CATH REMOVAL    . PORTACATH PLACEMENT    . PORTACATH PLACEMENT Right 03/12/2015   Procedure: INSERTION PORT-A-CATH;  Surgeon: [name], MD;   Location: [facility];  Service: General;  Laterality: Right;  . RADICAL NECK DISSECTION    . TONSILLECTOMY     cancer treated with chemo and radiation  . TRIGGER FINGER RELEASE Right 05/19/2017   Procedure: RELEASE TRIGGER FINGER/A-1 PULLEY ring finger;  Surgeon: Earnestine Leys, MD;  Location: [facility];  Service: Orthopedics;  Laterality: Right;   Social History   Tobacco Use  . Smoking status: Former Smoker    Packs/day: 0.50    Years: 6.00    Pack years: 3.00    Types: Cigarettes    Last attempt to quit: 03/10/1984  Years since quitting: 33.3  . Smokeless tobacco: Never Used  Substance Use Topics  . Alcohol use: No    Alcohol/week: 0.0 oz  . Drug use: No   Family History  Problem Relation Age of Onset  . Osteoporosis Mother   . Hyperlipidemia Mother   . Depression Mother   . Cancer Mother        skin cancer ? basal cell and lung ca heavy smoker  . Alcohol abuse Father   . Cancer Father        skin CA ? basal cell  . Heart disease Father        CHF  . Hyperlipidemia Brother   . Hypertension Brother    Allergies  Allergen Reactions  . Amoxicillin Swelling    REACTION: rash, swelling Has patient had a PCN reaction causing immediate rash, facial/tongue/throat swelling, SOB or lightheadedness with hypotension: yes Has patient had a PCN reaction causing severe rash involving mucus membranes or skin necrosis: no Has patient had a PCN reaction that required hospitalization: no Has patient had a PCN reaction occurring within the last 10 years: yes If all of the above answers are "NO", then may proceed with Cephalosporin use.   [name] Fluconazole Rash    REACTION: hives  . 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 ALPRAZolam (XANAX) 0.5 MG tablet Take 1 tablet (0.5 mg total) by mouth at bedtime as needed for sleep. (Patient  taking differently: Take 0.5-1 mg by mouth 2 (two) times daily as needed for anxiety or sleep. ) 30 tablet 3  . amitriptyline (ELAVIL) 50 MG tablet Take 1 tablet (50 mg total) by mouth at bedtime. 90 tablet 3  . b complex vitamins tablet Take 1 tablet by mouth daily.      . cyanocobalamin (,VITAMIN B-12,) 1000 MCG/ML injection Inject 1,000 mcg into the muscle every 30 (thirty) days.      . fluticasone (FLONASE) 50 MCG/ACT nasal spray instill 2 sprays into each nostril once daily (Patient taking differently: instill 2 sprays into each nostril once daily as needed) 16 g 11  . gabapentin (NEURONTIN) 300 MG capsule Take 2 capsules (600 mg total) by mouth 3 (three) times daily. 180 capsule 6  . glipiZIDE (GLUCOTROL) 10 MG tablet Take 10 mg by mouth 2 (two) times daily before a meal.     . JANUMET XR 50-1000 MG TB24 Take 1 tablet by mouth 2 (two) times daily.   0  . lidocaine-prilocaine (EMLA) cream Apply 1 application topically as needed. Apply to port when going through Chemo    . magnesium chloride (SLOW-MAG) 64 MG TBEC SR tablet Take 1 tablet by mouth daily as needed (if blood work shows magnesium is low).     . mirtazapine (REMERON) 30 MG tablet take 2 tablet by mouth at bedtime  0  . sertraline (ZOLOFT) 100 MG tablet Take 200 mg by mouth daily.  0   Current Facility-Administered Medications on File Prior to Visit  Medication Dose Route Frequency Provider Last Rate Last Dose  . sodium chloride flush (NS) 0.9 % injection 10 mL  10 mL Intravenous PRN Cammie Sickle, MD   10 mL at 09/11/15 0845    Review of Systems  Constitutional: Negative for activity change, appetite change, fatigue, fever and unexpected weight change.  HENT:  Negative for congestion, ear pain, rhinorrhea, sinus pressure and sore throat.   Eyes: Negative for pain, redness and visual disturbance.  Respiratory: Negative for cough, shortness of breath and wheezing.   Cardiovascular: Negative for chest pain and palpitations.    Gastrointestinal: Negative for abdominal pain, blood in stool, constipation and diarrhea.  Endocrine: Negative for polydipsia and polyuria.  Genitourinary: Negative for dysuria, frequency and urgency.  Musculoskeletal: Negative for arthralgias, back pain and myalgias.  Skin: Negative for pallor and rash.  Allergic/Immunologic: Negative for environmental allergies.  Neurological: Positive for numbness. Negative for dizziness, syncope and headaches.  Hematological: Negative for adenopathy. Does not bruise/bleed easily.  Psychiatric/Behavioral: Negative for decreased concentration and dysphoric mood. The patient is not nervous/anxious.        Objective:   Physical Exam  Constitutional: She appears well-developed and well-nourished. No distress.  obese and well appearing   HENT:  Head: Normocephalic and atraumatic.  Right Ear: External ear normal.  Left Ear: External ear normal.  Nose: Nose normal.  Mouth/Throat: Oropharynx is clear and moist.  Baseline dry mouth and surgical throat changes   Eyes: Pupils are equal, round, and reactive to light. Conjunctivae and EOM are normal. Right eye exhibits no discharge. Left eye exhibits no discharge. No scleral icterus.  Neck: Normal range of motion. Neck supple. No JVD present. Carotid bruit is not present. No thyromegaly present.  Cardiovascular: Normal rate, regular rhythm, normal heart sounds and intact distal pulses. Exam reveals no gallop.  Pulmonary/Chest: Effort normal and breath sounds normal. No respiratory distress. She has no wheezes. She has no rales.  Abdominal: Soft. Bowel sounds are normal. She exhibits no distension and no mass. There is no tenderness.  Musculoskeletal: She exhibits no edema or tenderness.  No kyphosis   Lymphadenopathy:    She has no cervical adenopathy.  Neurological: She is alert. She has normal reflexes. No cranial nerve deficit. She exhibits normal muscle tone. Coordination normal.  Skin: Skin is warm and  dry. No rash noted. No erythema. No pallor.  Solar lentigines diffusely Some sks   Psychiatric: She has a normal mood and affect.  Pleasant           Assessment & Plan:   Problem List Items Addressed This Visit      Endocrine   Controlled type 2 diabetes mellitus without complication ([place]) - Primary    Sees endocrinology  Lab Results  Component Value Date   HGBA1C 8.0 (H) 06/29/2017   Disc eye and foot care Need for wt loss       Relevant Medications   atorvastatin (LIPITOR) 10 MG tablet   lisinopril (PRINIVIL,ZESTRIL) 5 MG tablet     Nervous and Auditory   Carpal tunnel syndrome of right wrist    Improved after surgery      Relevant Medications   rOPINIRole (REQUIP) 1 MG tablet   Chemotherapy-induced neuropathy ([facility])    Improved in UE  Stable in LEs      Relevant Medications   rOPINIRole (REQUIP) 1 MG tablet     Musculoskeletal and Integument   Disorder of bone and cartilage    dexa in 9/16-nl bmd  Disc inc D dose to 5000 iu  Disc need for calcium/ vitamin D/ wt bearing exercise and bone density test every 2 y to monitor Disc safety/ fracture risk in detail          Other   Anemia, iron deficiency    Iron def and from chemo in the  past Stable - Hb of 10.3 Followed by heme/onc      B12 deficiency    Lab Results  Component Value Date   VITAMINB12 514 06/29/2017   Continue injections       Colon cancer screening    Reassuring cologuard 6/18 -good for  3y      Hyperlipidemia    Disc goals for lipids and reasons to control them Rev last labs with pt Rev low sat fat diet in detail  Atorvastatin and diet Will cut out bacon      Relevant Medications   atorvastatin (LIPITOR) 10 MG tablet   lisinopril (PRINIVIL,ZESTRIL) 5 MG tablet   spironolactone (ALDACTONE) 25 MG tablet   Hypomagnesemia    Continue slo-mag      Vitamin D deficiency    D in low 30s Inc to 5000 iu daily  Disc imp to bone and overall health

## 2017-07-05 NOTE — Assessment & Plan Note (Signed)
D in low 30s Inc to 5000 iu daily  Disc imp to bone and overall health

## 2017-07-05 NOTE — Assessment & Plan Note (Signed)
Continue slo-mag

## 2017-07-05 NOTE — Assessment & Plan Note (Signed)
Lab Results  Component Value Date   VITAMINB12 514 06/29/2017   Continue injections

## 2017-07-05 NOTE — Assessment & Plan Note (Signed)
Reassuring cologuard 6/18 -good for  3y

## 2017-07-05 NOTE — Assessment & Plan Note (Signed)
Improved in UE  Stable in LEs

## 2017-07-05 NOTE — Assessment & Plan Note (Signed)
Iron def and from chemo in the past Stable - Hb of 10.3 Followed by heme/onc

## 2017-07-05 NOTE — Patient Instructions (Addendum)
Don't forget to schedule your annual eye doctor appt   If you are interested in the new shingles vaccine (Shingrix) - call your local pharmacy to check on coverage and availability (when your oncologist says it is ok)   Your vitamin D was low normal - increase to 5000 iu per day   For cholesterol  Avoid red meat/ fried foods/ egg yolks/ fatty breakfast meats/ butter, cheese and high fat dairy/ and shellfish   Use [place] bacon for BLTs and low fat mayo   Take care of yourself

## 2017-07-26 ENCOUNTER — Other Ambulatory Visit: Payer: Self-pay | Admitting: Family Medicine

## 2017-07-27 ENCOUNTER — Other Ambulatory Visit: Payer: Self-pay | Admitting: Internal Medicine

## 2017-07-27 DIAGNOSIS — C50412 Malignant neoplasm of upper-outer quadrant of left female breast: Secondary | ICD-10-CM

## 2017-07-27 DIAGNOSIS — Z171 Estrogen receptor negative status [ER-]: Principal | ICD-10-CM

## 2017-08-01 ENCOUNTER — Other Ambulatory Visit: Payer: Self-pay | Admitting: Family Medicine

## 2017-08-04 DIAGNOSIS — F411 Generalized anxiety disorder: Secondary | ICD-10-CM | POA: Diagnosis not present

## 2017-08-13 ENCOUNTER — Inpatient Hospital Stay: Payer: Medicare Other | Attending: Internal Medicine

## 2017-08-13 DIAGNOSIS — Z171 Estrogen receptor negative status [ER-]: Secondary | ICD-10-CM | POA: Diagnosis not present

## 2017-08-13 DIAGNOSIS — Z9221 Personal history of antineoplastic chemotherapy: Secondary | ICD-10-CM | POA: Insufficient documentation

## 2017-08-13 DIAGNOSIS — C50412 Malignant neoplasm of upper-outer quadrant of left female breast: Secondary | ICD-10-CM | POA: Diagnosis not present

## 2017-08-13 DIAGNOSIS — Z452 Encounter for adjustment and management of vascular access device: Secondary | ICD-10-CM | POA: Diagnosis not present

## 2017-08-13 DIAGNOSIS — Z923 Personal history of irradiation: Secondary | ICD-10-CM | POA: Insufficient documentation

## 2017-08-13 DIAGNOSIS — Z95828 Presence of other vascular implants and grafts: Secondary | ICD-10-CM

## 2017-08-13 MED ORDER — HEPARIN SOD (PORK) LOCK FLUSH 100 UNIT/ML IV SOLN
500.0000 [IU] | Freq: Once | INTRAVENOUS | Status: AC
  Administered 2017-08-13: 500 [IU] via INTRAVENOUS

## 2017-08-13 MED ORDER — SODIUM CHLORIDE 0.9% FLUSH
10.0000 mL | Freq: Once | INTRAVENOUS | Status: AC
  Administered 2017-08-13: 10 mL via INTRAVENOUS
  Filled 2017-08-13: qty 10

## 2017-08-28 ENCOUNTER — Other Ambulatory Visit: Payer: Self-pay | Admitting: Oncology

## 2017-08-28 DIAGNOSIS — C50412 Malignant neoplasm of upper-outer quadrant of left female breast: Secondary | ICD-10-CM

## 2017-08-28 DIAGNOSIS — Z171 Estrogen receptor negative status [ER-]: Principal | ICD-10-CM

## 2017-08-28 NOTE — Telephone Encounter (Signed)
Brahmanday patient.

## 2017-09-09 DIAGNOSIS — E1165 Type 2 diabetes mellitus with hyperglycemia: Secondary | ICD-10-CM | POA: Diagnosis not present

## 2017-09-14 DIAGNOSIS — E113393 Type 2 diabetes mellitus with moderate nonproliferative diabetic retinopathy without macular edema, bilateral: Secondary | ICD-10-CM | POA: Diagnosis not present

## 2017-09-14 LAB — HM DIABETES EYE EXAM

## 2017-09-16 DIAGNOSIS — G620 Drug-induced polyneuropathy: Secondary | ICD-10-CM | POA: Diagnosis not present

## 2017-09-16 DIAGNOSIS — R55 Syncope and collapse: Secondary | ICD-10-CM | POA: Diagnosis not present

## 2017-09-16 DIAGNOSIS — E119 Type 2 diabetes mellitus without complications: Secondary | ICD-10-CM | POA: Diagnosis not present

## 2017-10-08 ENCOUNTER — Inpatient Hospital Stay (HOSPITAL_BASED_OUTPATIENT_CLINIC_OR_DEPARTMENT_OTHER): Payer: Medicare Other | Admitting: Internal Medicine

## 2017-10-08 ENCOUNTER — Encounter: Payer: Self-pay | Admitting: Internal Medicine

## 2017-10-08 ENCOUNTER — Inpatient Hospital Stay: Payer: Medicare Other | Attending: Internal Medicine

## 2017-10-08 ENCOUNTER — Other Ambulatory Visit: Payer: Self-pay

## 2017-10-08 VITALS — BP 122/67 | HR 94 | Temp 97.9°F | Resp 20 | Ht 65.5 in | Wt 225.0 lb

## 2017-10-08 DIAGNOSIS — Z9181 History of falling: Secondary | ICD-10-CM

## 2017-10-08 DIAGNOSIS — Z85818 Personal history of malignant neoplasm of other sites of lip, oral cavity, and pharynx: Secondary | ICD-10-CM | POA: Insufficient documentation

## 2017-10-08 DIAGNOSIS — Z171 Estrogen receptor negative status [ER-]: Secondary | ICD-10-CM | POA: Insufficient documentation

## 2017-10-08 DIAGNOSIS — E785 Hyperlipidemia, unspecified: Secondary | ICD-10-CM | POA: Insufficient documentation

## 2017-10-08 DIAGNOSIS — Z9221 Personal history of antineoplastic chemotherapy: Secondary | ICD-10-CM | POA: Insufficient documentation

## 2017-10-08 DIAGNOSIS — Z7984 Long term (current) use of oral hypoglycemic drugs: Secondary | ICD-10-CM

## 2017-10-08 DIAGNOSIS — D649 Anemia, unspecified: Secondary | ICD-10-CM | POA: Diagnosis not present

## 2017-10-08 DIAGNOSIS — C50412 Malignant neoplasm of upper-outer quadrant of left female breast: Secondary | ICD-10-CM | POA: Insufficient documentation

## 2017-10-08 DIAGNOSIS — Z87891 Personal history of nicotine dependence: Secondary | ICD-10-CM | POA: Insufficient documentation

## 2017-10-08 DIAGNOSIS — E1143 Type 2 diabetes mellitus with diabetic autonomic (poly)neuropathy: Secondary | ICD-10-CM | POA: Insufficient documentation

## 2017-10-08 DIAGNOSIS — Z79899 Other long term (current) drug therapy: Secondary | ICD-10-CM | POA: Insufficient documentation

## 2017-10-08 DIAGNOSIS — K219 Gastro-esophageal reflux disease without esophagitis: Secondary | ICD-10-CM | POA: Insufficient documentation

## 2017-10-08 DIAGNOSIS — D509 Iron deficiency anemia, unspecified: Secondary | ICD-10-CM

## 2017-10-08 DIAGNOSIS — I10 Essential (primary) hypertension: Secondary | ICD-10-CM

## 2017-10-08 DIAGNOSIS — R42 Dizziness and giddiness: Secondary | ICD-10-CM | POA: Diagnosis not present

## 2017-10-08 LAB — CBC WITH DIFFERENTIAL/PLATELET
BASOS PCT: 1 %
Basophils Absolute: 0 10*3/uL (ref 0–0.1)
Eosinophils Absolute: 0.3 10*3/uL (ref 0–0.7)
Eosinophils Relative: 4 %
HEMATOCRIT: 30.3 % — AB (ref 35.0–47.0)
HEMOGLOBIN: 9.8 g/dL — AB (ref 12.0–16.0)
LYMPHS PCT: 17 %
Lymphs Abs: 1.1 10*3/uL (ref 1.0–3.6)
MCH: 26.3 pg (ref 26.0–34.0)
MCHC: 32.3 g/dL (ref 32.0–36.0)
MCV: 81.5 fL (ref 80.0–100.0)
MONOS PCT: 5 %
Monocytes Absolute: 0.3 10*3/uL (ref 0.2–0.9)
NEUTROS PCT: 73 %
Neutro Abs: 5.1 10*3/uL (ref 1.4–6.5)
Platelets: 238 10*3/uL (ref 150–440)
RBC: 3.71 MIL/uL — ABNORMAL LOW (ref 3.80–5.20)
RDW: 17.1 % — ABNORMAL HIGH (ref 11.5–14.5)
WBC: 6.8 10*3/uL (ref 3.6–11.0)

## 2017-10-08 LAB — COMPREHENSIVE METABOLIC PANEL
ALBUMIN: 3.7 g/dL (ref 3.5–5.0)
ALT: 16 U/L (ref 0–44)
AST: 20 U/L (ref 15–41)
Alkaline Phosphatase: 72 U/L (ref 38–126)
Anion gap: 8 (ref 5–15)
BUN: 22 mg/dL (ref 8–23)
CHLORIDE: 106 mmol/L (ref 98–111)
CO2: 24 mmol/L (ref 22–32)
Calcium: 8.9 mg/dL (ref 8.9–10.3)
Creatinine, Ser: 0.84 mg/dL (ref 0.44–1.00)
GFR calc non Af Amer: >60 mL/min (ref >60)
GLUCOSE: 83 mg/dL (ref 70–99)
POTASSIUM: 4.1 mmol/L (ref 3.5–5.1)
SODIUM: 138 mmol/L (ref 135–145)
Total Bilirubin: 0.3 mg/dL (ref 0.3–1.2)
Total Protein: 7.5 g/dL (ref 6.5–8.1)

## 2017-10-08 MED ORDER — MECLIZINE HCL 25 MG PO TABS: 25.0000 mg | ORAL_TABLET | Freq: Three times a day (TID) | ORAL | 0 refills | Status: DC | PRN

## 2017-10-08 MED ORDER — SODIUM CHLORIDE 0.9% FLUSH
10.0000 mL | Freq: Once | INTRAVENOUS | Status: AC
  Administered 2017-10-08: 10 mL via INTRAVENOUS
  Filled 2017-10-08: qty 10

## 2017-10-08 MED ORDER — HEPARIN SOD (PORK) LOCK FLUSH 100 UNIT/ML IV SOLN
500.0000 [IU] | Freq: Once | INTRAVENOUS | Status: AC
  Administered 2017-10-08: 500 [IU] via INTRAVENOUS
  Filled 2017-10-08: qty 5

## 2017-10-08 NOTE — Assessment & Plan Note (Addendum)
#   Left breast cancer stage III  ER/PR/her 2 Neu NEG. s/p neoadjuvant chemotherapy-currently under surveillance stable.  # No clinical evidence of progression or recurrence; [however see discussion below]  # Dizzy spells- ? Etiology-question CNS causes versus peripheral causes of vertigo.  Given the history of stage III breast cancer-high risk of metastasis to the brain.  Recommend MRI of the brain [patient on a vacation starting tomorrow]; recommend Antivert every 8 hours as needed.  If MRI brain negative would recommend neurology evaluation.  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   # Mild anemia secondary to bone marrow suppression from previous chemotherapy. Hemoglobin 9.8;  will check iron studie/ferritin. cologuard 2018- Neg. STABLE.   # Hypomagnesemia- 1.5;  home supplementation.  #  MRI in 1 week/vacation; follow up TBD;

## 2017-10-08 NOTE — Progress Notes (Signed)
[place] OFFICE PROGRESS NOTE  Patient Care Team: Tower, [name], MD as PCP - General Byrnett, [name], MD (General Surgery) [name], MD (Inactive) (Oncology)  Cancer Staging No matching staging information was found for the patient.   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 5 CM TUMOR MASS BIOPSIES POSITIVE FOR INVASIVE MAMMARY CARCINOMA TRIPLE NEGATIVE DISEASE(jANUARY, 2017)stage IIIa. ER/PR/her2 Neu-NEGATIVE  # Cytoxan and Adriamycin from March 18, 2015; carbo-taxol x4 cycles  # AUg 18th s/p Lumpec & SLNBx- Complete path CR [ypT0 ypsn0] s/p RT [oct 2017; Dr.Crystal]  3. MUGA scan of the heart shows ejection fraction to be 61% (January, 2017) 4  ------------------------------------------------------------    DIAGNOSIS: BREAST CANCER  STAGE:  III       ;GOALS: curative  CURRENT/MOST RECENT THERAPY: surveillaince.       Carcinoma of upper-outer quadrant of left breast in female, estrogen receptor negative ([place])   11/21/2015 Initial Diagnosis    Carcinoma of upper-outer quadrant of left breast in female, estrogen receptor negative ([place])       INTERVAL HISTORY:  Suzanne Strong 72 y.o.  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  Patient states that she has been having episodes of " feels like jelly" for the last 1 to 2 months.  Having episodes up to 3 a week/more frequent.  Feels dizzy especially with movement.  Episodes last for about a minute.  No presyncope or syncope.  No loss of bladder or bowel incontinence.  Episode of fall.  Did not hit the head.  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  Is not getting worse.  Denies  any new lumps above.  Denies of blood in stools black or stools.  Review of Systems  Constitutional: Negative for chills, diaphoresis, fever, malaise/fatigue and weight loss.  HENT: Negative for nosebleeds and sore throat.   Eyes: Negative for double vision.  Respiratory: Negative for cough, hemoptysis, sputum production, shortness of breath and wheezing.   Cardiovascular: Negative for chest pain, palpitations, orthopnea and leg swelling.  Gastrointestinal: Negative for abdominal pain, blood in stool, constipation, diarrhea, heartburn, melena, nausea and vomiting.  Genitourinary: Negative for dysuria, frequency and urgency.  Musculoskeletal: Negative for back pain and joint pain.  Skin: Negative.  Negative for itching and rash.  Neurological: Positive for dizziness (s/p fall) and tingling. Negative for focal weakness, weakness and headaches.  Endo/Heme/Allergies: Does not bruise/bleed easily.  Psychiatric/Behavioral: Negative for depression. The patient is not nervous/anxious and does not have insomnia.       PAST MEDICAL HISTORY :  Past Medical History:  Diagnosis Date  . Anemia    iron deficiency and B12 deficiency  . Anxiety   . Breast cancer ([place]) 02/25/2015   upper-outer quadrant of left female breast, Triple Negative. Neo-adjuvant chemo with complete pathologic response.   . Cervical dysplasia    conization  . Diabetes mellitus without complication ([place])   . Diverticulosis   . Fever 04/11/2015  . Gastroparesis    related to previous radiation tx  . GERD (gastroesophageal reflux disease)   . Hyperlipidemia   . Hypertension   . Shingles    Hx of  . 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 APPENDECTOMY    . BREAST BIOPSY Left 02/25/2015   INVASIVE MAMMARY CARCINOMA,Triple negative.  [name] BREAST CYST ASPIRATION    . BREAST EXCISIONAL BIOPSY Left 12/2015   surgery  . BREAST LUMPECTOMY WITH NEEDLE  LOCALIZATION Left 10/02/2015   Procedure: BREAST LUMPECTOMY WITH NEEDLE LOCALIZATION;  Surgeon: [name], MD;  Location: [facility] ORS;  Service: General;  Laterality: Left;  . BREAST LUMPECTOMY WITH SENTINEL LYMPH NODE BIOPSY Left 10/02/2015   Procedure: LEFT BREAST WIDE EXCISION WITH SENTINEL LYMPH NODE BX;  Surgeon: [name], MD;  Location: [facility] ORS;  Service: General;  Laterality: Left;  . BREAST SURGERY     breast biopsy benign  . CARPAL TUNNEL RELEASE Right 05/19/2017   Procedure: CARPAL TUNNEL RELEASE;  Surgeon: Earnestine Leys, MD;  Location: [facility] ORS;  Service: Orthopedics;  Laterality: Right;  . CHOLECYSTECTOMY    . Cologuard  07/22/2016   Negative  . ESOPHAGOGASTRODUODENOSCOPY  07/2009   normal with few gastric polyps  . MOLE REMOVAL    . PORT-A-CATH REMOVAL    . PORTACATH PLACEMENT    . PORTACATH PLACEMENT Right 03/12/2015   Procedure: INSERTION PORT-A-CATH;  Surgeon: [name], MD;  Location: [facility] OR[facility];  Service: General;  Laterality: Right;  . RADICAL NECK DISSECTION    . TONSILLECTOMY     cancer treated with chemo and radiation  . TRIGGER FINGER RELEASE Right 05/19/2017   Procedure: RELEASE TRIGGER FINGER/A-1 PULLEY ring finger;  Surgeon: Earnestine Leys, MD;  Location: [facility] ORS;  Service: Orthopedics;  Laterality: Right;    FAMILY HISTORY :   Family History  Problem Relation Age of Onset  . Osteoporosis Mother   . Hyperlipidemia Mother   . Depression Mother   . Cancer Mother        skin cancer ? basal cell and lung ca heavy smoker  . Alcohol abuse Father   . Cancer Father        skin CA ? basal cell  . Heart disease Father        CHF  . Hyperlipidemia Brother   . Hypertension Brother     SOCIAL HISTORY:   Social History   Tobacco Use  . Smoking status: Former Smoker    Packs/day: 0.50    Years: 6.00    Pack years: 3.00    Types: Cigarettes    Last attempt to quit: 03/10/1984    Years since quitting: 33.6  . Smokeless tobacco: Never Used   Substance Use Topics  . Alcohol use: No    Alcohol/week: 0.0 standard drinks  . Drug use: No    ALLERGIES:  is allergic to amoxicillin; fluconazole; and other.  MEDICATIONS:  Current Outpatient Medications  Medication Sig Dispense Refill  . ALPRAZolam (XANAX) 0.5 MG tablet Take 1 tablet (0.5 mg total) by mouth at bedtime as needed for sleep. (Patient taking differently: Take 0.5-1 mg by mouth 2 (two) times daily as needed for anxiety or sleep. ) 30 tablet 3  . amitriptyline (ELAVIL) 50 MG tablet Take 1 tablet (50 mg total) by mouth at bedtime. 90 tablet 3  . atorvastatin (LIPITOR) 10 MG tablet Take 1 tablet (10 mg total) by mouth daily. 90 tablet 3  . b complex vitamins tablet Take 1 tablet by mouth daily.      . cyanocobalamin (,VITAMIN B-12,) 1000 MCG/ML injection Inject 1,000 mcg into the muscle every 30 (thirty) days.      [name]  fluticasone (FLONASE) 50 MCG/ACT nasal spray instill 2 sprays into each nostril once daily (Patient taking differently: instill 2 sprays into each nostril once daily as needed) 16 g 11  . gabapentin (NEURONTIN) 300 MG capsule TAKE 2 CAPSULES BY MOUTH THREE TIMES DAILY 180 capsule 3  . glipiZIDE (GLUCOTROL) 10 MG tablet Take 10 mg by mouth 2 (two) times daily before a meal.     . JANUMET XR 50-1000 MG TB24 Take 1 tablet by mouth 2 (two) times daily.   0  . lidocaine-prilocaine (EMLA) cream Apply 1 application topically as needed. Apply to port when going through Chemo    . lisinopril (PRINIVIL,ZESTRIL) 5 MG tablet Take 1 tablet (5 mg total) by mouth daily. 90 tablet 3  . magnesium chloride (SLOW-MAG) 64 MG TBEC SR tablet Take 1 tablet by mouth daily as needed (if blood work shows magnesium is low).     . mirtazapine (REMERON) 30 MG tablet take 2 tablet by mouth at bedtime  0  . omeprazole (PRILOSEC) 20 MG capsule Take 1 capsule (20 mg total) by mouth at bedtime. 90 capsule 3  . rOPINIRole (REQUIP) 1 MG tablet Take 3 tablets (3 mg total) by mouth See admin  instructions. 270 tablet 3  . sertraline (ZOLOFT) 100 MG tablet Take 200 mg by mouth daily.  0  . spironolactone (ALDACTONE) 25 MG tablet Take 0.5 tablets (12.5 mg total) by mouth 2 (two) times daily as needed. 90 tablet 3  . meclizine (ANTIVERT) 25 MG tablet Take 1 tablet (25 mg total) by mouth 3 (three) times daily as needed for dizziness. 30 tablet 0   No current facility-administered medications for this visit.    Facility-Administered Medications Ordered in Other Visits  Medication Dose Route Frequency Provider Last Rate Last Dose  . sodium chloride flush (NS) 0.9 % injection 10 mL  10 mL Intravenous PRN Cammie Sickle, MD   10 mL at 09/11/15 0845    PHYSICAL EXAMINATION: ECOG PERFORMANCE STATUS: 1 - Symptomatic but completely ambulatory  BP 122/67   Pulse 94   Temp 97.9 F (36.6 C) (Tympanic)   Resp 20   Ht 5' 5.5" (1.664 m)   Wt 225 lb (102.1 kg)   BMI 36.87 kg/m   Filed Weights   10/08/17 1408  Weight: 225 lb (102.1 kg)    Physical Exam  Constitutional: She is oriented to person, place, and time and well-developed, well-nourished, and in no distress.  Obese.  She is walking herself.  Alone.  HENT:  Head: Normocephalic and atraumatic.  Mouth/Throat: Oropharynx is clear and moist. No oropharyngeal exudate.  Eyes: Pupils are equal, round, and reactive to light.  Neck: Normal range of motion. Neck supple.  Cardiovascular: Normal rate and regular rhythm.  Pulmonary/Chest: No respiratory distress. She has no wheezes.  Abdominal: Soft. Bowel sounds are normal. She exhibits no distension and no mass. There is no tenderness. There is no rebound and no guarding.  Musculoskeletal: Normal range of motion. She exhibits no edema or tenderness.  Neurological: She is alert and oriented to person, place, and time.  Skin: Skin is warm.  Psychiatric: Affect normal.       LABORATORY DATA:  I have reviewed the data as listed    Component Value Date/Time   NA 138  10/08/2017 1339   NA 142 03/01/2014 0846   K 4.1 10/08/2017 1339   K 4.3 03/01/2014 0846   CL 106 10/08/2017 1339   CL 102 03/01/2014 0846  CO2 24 10/08/2017 1339   CO2 28 03/01/2014 0846   GLUCOSE 83 10/08/2017 1339   GLUCOSE 219 (H) 03/01/2014 0846   BUN 22 10/08/2017 1339   BUN 12 03/01/2014 0846   CREATININE 0.84 10/08/2017 1339   CREATININE 0.95 03/01/2014 0846   CALCIUM 8.9 10/08/2017 1339   CALCIUM 8.6 03/01/2014 0846   PROT 7.5 10/08/2017 1339   PROT 7.5 03/01/2014 0846   ALBUMIN 3.7 10/08/2017 1339   ALBUMIN 3.4 03/01/2014 0846   AST 20 10/08/2017 1339   AST 20 03/01/2014 0846   ALT 16 10/08/2017 1339   ALT 28 03/01/2014 0846   ALKPHOS 72 10/08/2017 1339   ALKPHOS 122 (H) 03/01/2014 0846   BILITOT 0.3 10/08/2017 1339   BILITOT 0.2 03/01/2014 0846   GFRNONAA >60 10/08/2017 1339   GFRNONAA >60 03/01/2014 0846   GFRNONAA >60 08/24/2013 1015   GFRAA >60 10/08/2017 1339   GFRAA >60 03/01/2014 0846   GFRAA >60 08/24/2013 1015    No results found for: SPEP, UPEP  Lab Results  Component Value Date   WBC 6.8 10/08/2017   NEUTROABS 5.1 10/08/2017   HGB 9.8 (L) 10/08/2017   HCT 30.3 (L) 10/08/2017   MCV 81.5 10/08/2017   PLT 238 10/08/2017      Chemistry      Component Value Date/Time   NA 138 10/08/2017 1339   NA 142 03/01/2014 0846   K 4.1 10/08/2017 1339   K 4.3 03/01/2014 0846   CL 106 10/08/2017 1339   CL 102 03/01/2014 0846   CO2 24 10/08/2017 1339   CO2 28 03/01/2014 0846   BUN 22 10/08/2017 1339   BUN 12 03/01/2014 0846   CREATININE 0.84 10/08/2017 1339   CREATININE 0.95 03/01/2014 0846      Component Value Date/Time   CALCIUM 8.9 10/08/2017 1339   CALCIUM 8.6 03/01/2014 0846   ALKPHOS 72 10/08/2017 1339   ALKPHOS 122 (H) 03/01/2014 0846   AST 20 10/08/2017 1339   AST 20 03/01/2014 0846   ALT 16 10/08/2017 1339   ALT 28 03/01/2014 0846   BILITOT 0.3 10/08/2017 1339   BILITOT 0.2 03/01/2014 0846       RADIOGRAPHIC STUDIES: I have  personally reviewed the radiological images as listed and agreed with the findings in the report. No results found.   ASSESSMENT & PLAN:  Carcinoma of upper-outer quadrant of left breast in female, estrogen receptor negative ([place]) # Left breast cancer stage III  ER/PR/her 2 Neu NEG. s/p neoadjuvant chemotherapy-currently under surveillance stable.  # No clinical evidence of progression or recurrence; [however see discussion below]  # Dizzy spells- ? Etiology-question CNS causes versus peripheral causes of vertigo.  Given the history of stage III breast cancer-high risk of metastasis to the brain.  Recommend MRI of the brain [patient on a vacation starting tomorrow]; recommend Antivert every 8 hours as needed.  If MRI brain negative would recommend neurology evaluation.  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   # Mild anemia secondary to bone marrow suppression from previous chemotherapy. Hemoglobin 9.8;  will check iron studie/ferritin. cologuard 2018- Neg. STABLE.   # Hypomagnesemia- 1.5;  home supplementation.  #  MRI in 1 week/vacation; follow up TBD;    Orders Placed This Encounter  Procedures  . MR Brain W Wo Contrast    Standing Status:   Future    Standing Expiration Date:   10/08/2018    Order  Specific Question:   ** REASON FOR EXAM (FREE TEXT)    Answer:   dizzy spells; breast cancer    Order Specific Question:   If indicated for the ordered procedure, I authorize the administration of contrast media per Radiology protocol    Answer:   Yes    Order Specific Question:   What is the patient's sedation requirement?    Answer:   No Sedation    Order Specific Question:   Does the patient have a pacemaker or implanted devices?    Answer:   No    Order Specific Question:   Use SRS Protocol?    Answer:   No    Order Specific Question:   Radiology Contrast Protocol - do NOT remove file path    Answer:    [email or web address]    Order Specific Question:   Preferred imaging location?    Answer:   [facility] (table limit-300lbs)  . Ferritin    Standing Status:   Future    Number of Occurrences:   1    Standing Expiration Date:   10/09/2018  . Iron and TIBC    Standing Status:   Future    Number of Occurrences:   1    Standing Expiration Date:   10/09/2018   All questions were answered. The patient knows to call the clinic with any problems, questions or concerns.      Cammie Sickle, MD 10/08/2017 4:37 PM

## 2017-10-09 LAB — CANCER ANTIGEN 27.29: CA 27.29: 7.1 U/mL (ref 0.0–38.6)

## 2017-10-21 ENCOUNTER — Ambulatory Visit
Admission: RE | Admit: 2017-10-21 | Discharge: 2017-10-21 | Disposition: A | Discharge status: Home / Self Care | Payer: Medicare Other | Source: Ambulatory Visit | Attending: Internal Medicine | Admitting: Internal Medicine

## 2017-10-21 DIAGNOSIS — C50412 Malignant neoplasm of upper-outer quadrant of left female breast: Secondary | ICD-10-CM

## 2017-10-21 DIAGNOSIS — Z171 Estrogen receptor negative status [ER-]: Secondary | ICD-10-CM | POA: Diagnosis not present

## 2017-10-21 DIAGNOSIS — R42 Dizziness and giddiness: Secondary | ICD-10-CM | POA: Insufficient documentation

## 2017-10-21 MED ORDER — GADOBENATE DIMEGLUMINE 529 MG/ML IV SOLN
20.0000 mL | Freq: Once | INTRAVENOUS | Status: AC | PRN
  Administered 2017-10-21: 20 mL via INTRAVENOUS

## 2017-10-22 ENCOUNTER — Telehealth: Payer: Self-pay | Admitting: Internal Medicine

## 2017-10-22 DIAGNOSIS — Z171 Estrogen receptor negative status [ER-]: Principal | ICD-10-CM

## 2017-10-22 DIAGNOSIS — C50412 Malignant neoplasm of upper-outer quadrant of left female breast: Secondary | ICD-10-CM

## 2017-10-22 NOTE — Telephone Encounter (Signed)
H/B- Please inform patient that her MRI scan of the brain is negative for any cancer or any clear explanation for her dizziness.  If continued dizziness recommend follow-up with PCP.  Otherwise patient will follow-up with me in approximately in 6 months with CBC CMP CA 2729. Thx

## 2017-10-22 NOTE — Telephone Encounter (Signed)
Contacted patient - reviewed results and plan of care with patient. Pt given new apts to come back in 6 months

## 2017-10-22 NOTE — Addendum Note (Signed)
Addended by: Sabino Gasser on: 10/22/2017 03:52 PM   Modules accepted: Orders

## 2017-10-27 DIAGNOSIS — F411 Generalized anxiety disorder: Secondary | ICD-10-CM | POA: Diagnosis not present

## 2017-11-25 ENCOUNTER — Inpatient Hospital Stay: Payer: Medicare Other | Attending: Internal Medicine

## 2017-11-25 DIAGNOSIS — C50412 Malignant neoplasm of upper-outer quadrant of left female breast: Secondary | ICD-10-CM | POA: Diagnosis not present

## 2017-11-25 DIAGNOSIS — Z9221 Personal history of antineoplastic chemotherapy: Secondary | ICD-10-CM | POA: Insufficient documentation

## 2017-11-25 DIAGNOSIS — Z95828 Presence of other vascular implants and grafts: Secondary | ICD-10-CM

## 2017-11-25 DIAGNOSIS — Z452 Encounter for adjustment and management of vascular access device: Secondary | ICD-10-CM | POA: Diagnosis not present

## 2017-11-25 DIAGNOSIS — Z171 Estrogen receptor negative status [ER-]: Secondary | ICD-10-CM | POA: Insufficient documentation

## 2017-11-25 MED ORDER — HEPARIN SOD (PORK) LOCK FLUSH 100 UNIT/ML IV SOLN
500.0000 [IU] | Freq: Once | INTRAVENOUS | Status: AC
  Administered 2017-11-25: 500 [IU] via INTRAVENOUS

## 2017-11-25 MED ORDER — SODIUM CHLORIDE 0.9% FLUSH
10.0000 mL | Freq: Once | INTRAVENOUS | Status: AC
  Administered 2017-11-25: 10 mL via INTRAVENOUS
  Filled 2017-11-25: qty 10

## 2017-12-08 ENCOUNTER — Ambulatory Visit (INDEPENDENT_AMBULATORY_CARE_PROVIDER_SITE_OTHER): Payer: Medicare Other | Admitting: Psychiatry

## 2017-12-08 DIAGNOSIS — F411 Generalized anxiety disorder: Secondary | ICD-10-CM

## 2017-12-08 DIAGNOSIS — G4700 Insomnia, unspecified: Secondary | ICD-10-CM | POA: Diagnosis not present

## 2017-12-08 MED ORDER — SERTRALINE HCL 100 MG PO TABS: 200.0000 mg | ORAL_TABLET | Freq: Every day | ORAL | 2 refills | Status: DC

## 2017-12-08 NOTE — Progress Notes (Signed)
[facility] Med Check  Patient ID: Suzanne Strong,  MRN: [id number]  PCP: [name], MD  Date of Evaluation: 12/08/2017 Time spent:20 minutes   HISTORY/CURRENT STATUS: HPI patient is a 72 year old white female last seen 10/27/2017.  Her anxiety and depression were better she was still having problems with insomnia.  PCP has been on 50 mg of amitriptyline for neuropathy.  I asked the patient to decrease her Remeron from 60 to 30 mg at bedtime to help with sleep, retry melatonin.   Currently her anxiety and is doing well.  She still complains of insomnia to 3 nights a week.  She is not exceptionally tired after that the following day.  He has had a sleep study which showed sleep apnea along with decreased oxygenation these results have been sent to her family doctor.  She does not want a does not want CPAP and is not interested in oral appliance.  She does have the head of her bed raised.  Individual Medical History/ Review of Systems: Changes? :No Patient's past medical history is positive for breast cancer carpal tunnel syndrome surgery.  Allergies: Amoxicillin; Fluconazole; Difuleron [ferrous fumarate-dss]; and Other  Current Medications:  Current Outpatient Medications:  .  ALPRAZolam (XANAX) 0.5 MG tablet, Take 1 tablet (0.5 mg total) by mouth at bedtime as needed for sleep. (Patient taking differently: Take 0.5-1 mg by mouth 2 (two) times daily as needed for anxiety or sleep. ), Disp: 30 tablet, Rfl: 3 .  amitriptyline (ELAVIL) 50 MG tablet, Take 1 tablet (50 mg total) by mouth at bedtime., Disp: 90 tablet, Rfl: 3 .  atorvastatin (LIPITOR) 10 MG tablet, Take 1 tablet (10 mg total) by mouth daily., Disp: 90 tablet, Rfl: 3 .  b complex vitamins tablet, Take 1 tablet by mouth daily.  , Disp: , Rfl:  .  cyanocobalamin (,VITAMIN B-12,) 1000 MCG/ML injection, Inject 1,000 mcg into the muscle every 30 (thirty) days.  , Disp: , Rfl:  .  gabapentin (NEURONTIN) 300 MG capsule, TAKE 2  CAPSULES BY MOUTH THREE TIMES DAILY, Disp: 180 capsule, Rfl: 3 .  glipiZIDE (GLUCOTROL) 10 MG tablet, Take 10 mg by mouth 2 (two) times daily before a meal. , Disp: , Rfl:  .  JANUMET XR 50-1000 MG TB24, Take 1 tablet by mouth 2 (two) times daily. , Disp: , Rfl: 0 .  lidocaine-prilocaine (EMLA) cream, Apply 1 application topically as needed. Apply to port when going through Chemo, Disp: , Rfl:  .  lisinopril (PRINIVIL,ZESTRIL) 5 MG tablet, Take 1 tablet (5 mg total) by mouth daily., Disp: 90 tablet, Rfl: 3 .  magnesium chloride (SLOW-MAG) 64 MG TBEC SR tablet, Take 1 tablet by mouth daily as needed (if blood work shows magnesium is low). , Disp: , Rfl:  .  meclizine (ANTIVERT) 25 MG tablet, Take 1 tablet (25 mg total) by mouth 3 (three) times daily as needed for dizziness., Disp: 30 tablet, Rfl: 0 .  omeprazole (PRILOSEC) 20 MG capsule, Take 1 capsule (20 mg total) by mouth at bedtime., Disp: 90 capsule, Rfl: 3 .  rOPINIRole (REQUIP) 1 MG tablet, Take 3 tablets (3 mg total) by mouth See admin instructions., Disp: 270 tablet, Rfl: 3 .  sertraline (ZOLOFT) 100 MG tablet, Take 2 tablets (200 mg total) by mouth daily., Disp: 60 tablet, Rfl: 2 .  spironolactone (ALDACTONE) 25 MG tablet, Take 0.5 tablets (12.5 mg total) by mouth 2 (two) times daily as needed., Disp: 90 tablet, Rfl: 3 .  fluticasone (  FLONASE) 50 MCG/ACT nasal spray, instill 2 sprays into each nostril once daily (Patient not taking: Reported on 12/08/2017), Disp: 16 g, Rfl: 11 No current facility-administered medications for this visit.   Facility-Administered Medications Ordered in Other Visits:  .  sodium chloride flush (NS) 0.9 % injection 10 mL, 10 mL, Intravenous, PRN, Cammie Sickle, MD, 10 mL at 09/11/15 0845 Medication Side Effects: None  Family Medical/ Social History: Changes? No  MENTAL HEALTH EXAM:  There were no vitals taken for this visit.There is no height or weight on file to calculate BMI.  General Appearance:  casual , overweight  Eye Contact:  Good  Speech:  Blocked and Normal Rate  Volume:  Normal  Mood:  Euthymic  Affect:  Appropriate  Thought Process:  Linear  Orientation:  Full (Time, Place, and Person)  Thought Content: Logical   Suicidal Thoughts:  No  Homicidal Thoughts:  No  Memory normal  Judgement:  Intact  Insight:  Good  Psychomotor Activity:  Normal  Concentration:  Concentration: Good  Recall:  Good  Fund of Knowledge: Good  Language: Good  Akathisia:  NA  AIMS (if indicated): na  Assets:  Social Support  ADL's:  Intact  Cognition: WNL  Prognosis:  Good    DIAGNOSES:    ICD-10-CM   1. Anxiety state F41.1   2. Insomnia, unspecified type G47.00     RECOMMENDATIONS: Patient's main concern today is insomnia.  Have asked her to discontinue the Remeron.  She is to start trazodone 50-100 mg at bedtime.  She also takes her Xanax at bed and takes amitriptyline for neuropathy.  Description of her Zoloft 100 mg 2 a day was sent by [facility].  The computer would not let me prescribe Xanax or trazodone so she has a written prescription Xanax 0.5 as needed at bedtime #30 trazodone 50 mg 1 -2 nightly #60 and she is to have 1 refill.  She is to return in 6 weeks.    Comer Locket, PA-C

## 2017-12-16 DIAGNOSIS — E113393 Type 2 diabetes mellitus with moderate nonproliferative diabetic retinopathy without macular edema, bilateral: Secondary | ICD-10-CM | POA: Diagnosis not present

## 2017-12-17 ENCOUNTER — Encounter: Payer: Self-pay | Admitting: Family Medicine

## 2017-12-17 ENCOUNTER — Ambulatory Visit (INDEPENDENT_AMBULATORY_CARE_PROVIDER_SITE_OTHER): Payer: Medicare Other | Admitting: Family Medicine

## 2017-12-17 VITALS — BP 118/70 | HR 88 | Temp 97.6°F | Ht 65.5 in | Wt 225.8 lb

## 2017-12-17 DIAGNOSIS — J209 Acute bronchitis, unspecified: Secondary | ICD-10-CM

## 2017-12-17 MED ORDER — HYDROCOD POLST-CPM POLST ER 10-8 MG/5ML PO SUER: 5.0000 mL | Freq: Two times a day (BID) | ORAL | 0 refills | Status: DC | PRN

## 2017-12-17 NOTE — Assessment & Plan Note (Signed)
With cough for a week and hoarseness Reassuring exam  tussionex px with caution of sedation and habit Fluids/rest Alert if wheeze or sob  Disc symptomatic care - see instructions on AVS  Update if not starting to improve in a week or if worsening    (pt does take ace and would consider hold if no imp as well)

## 2017-12-17 NOTE — Progress Notes (Signed)
Subjective:    Patient ID: Suzanne Strong, female    DOB: 1946/02/08, 72 y.o.   MRN: [id number]  HPI  Here with c/o cough  Started about a week ago  With a cough - dry  Sounds junky now-but nothing is coming up  Hoarseness   No nasal symptoms  Feels ok  Baseline throat  Ears feel ok  A little fatigue   No fever  No chills or aches   Cough keeps her up at night  On otc symptoms   Patient Active Problem List   Diagnosis Date Noted  . Medicare annual wellness visit, subsequent 06/29/2017  . Preoperative examination 04/27/2017  . Carpal tunnel syndrome of right wrist 11/13/2016  . Chemotherapy-induced neuropathy ([place]) 11/13/2016  . Acute bronchitis 04/07/2016  . Obstructive sleep apnea 01/01/2016  . Carcinoma of upper-outer quadrant of left breast in female, estrogen receptor negative ([place]) 11/21/2015  . Hypomagnesemia 08/28/2015  . Initial Medicare annual wellness visit 10/16/2014  . Estrogen deficiency 10/16/2014  . Colon cancer screening 10/16/2014  . Controlled type 2 diabetes mellitus without complication ([place]) 14/97/0263  . RLS (restless legs syndrome) 02/21/2014  . Chronic neck pain 07/25/2010  . Depression 07/07/2010  . Routine general medical examination at a health care facility 06/26/2010  . B12 deficiency 09/04/2009  . Anemia, iron deficiency 08/29/2009  . Anxiety state 08/26/2009  . GASTROPARESIS 08/26/2009  . Vitamin D deficiency 07/19/2008  . Disorder of bone and cartilage 07/19/2007  . Hyperlipidemia 07/18/2007  . EDEMA 07/18/2007   Past Medical History:  Diagnosis Date  . Anemia    iron deficiency and B12 deficiency  . Anxiety   . Breast cancer ([place]) 02/25/2015   upper-outer quadrant of left female breast, Triple Negative. Neo-adjuvant chemo with complete pathologic response.   . Cervical dysplasia    conization  . Diabetes mellitus without complication ([place])   . Diverticulosis   . Fever 04/11/2015  . Gastroparesis    related to  previous radiation tx  . GERD (gastroesophageal reflux disease)   . Hyperlipidemia   . Hypertension   . Shingles    Hx of  . 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 APPENDECTOMY    . BREAST BIOPSY Left 02/25/2015   INVASIVE MAMMARY CARCINOMA,Triple negative.  [name] BREAST CYST ASPIRATION    . BREAST EXCISIONAL BIOPSY Left 12/2015   surgery  . BREAST LUMPECTOMY WITH NEEDLE LOCALIZATION Left 10/02/2015   Procedure: BREAST LUMPECTOMY WITH NEEDLE LOCALIZATION;  Surgeon: [name], MD;  Location: [facility];  Service: General;  Laterality: Left;  . BREAST LUMPECTOMY WITH SENTINEL LYMPH NODE BIOPSY Left 10/02/2015   Procedure: LEFT BREAST WIDE EXCISION WITH SENTINEL LYMPH NODE BX;  Surgeon: [name], MD;  Location: [facility];  Service: General;  Laterality: Left;  . BREAST SURGERY     breast biopsy benign  . CARPAL TUNNEL RELEASE Right 05/19/2017   Procedure: CARPAL TUNNEL RELEASE;  Surgeon: Earnestine Leys, MD;  Location: [facility];  Service: Orthopedics;  Laterality: Right;  . CHOLECYSTECTOMY    . Cologuard  07/22/2016   Negative  . ESOPHAGOGASTRODUODENOSCOPY  07/2009   normal with few gastric polyps  . MOLE REMOVAL    . PORT-A-CATH REMOVAL    . PORTACATH PLACEMENT    . PORTACATH PLACEMENT Right 03/12/2015   Procedure: INSERTION PORT-A-CATH;  Surgeon: [name], MD;  Location: [facility];  Service: General;  Laterality: Right;  . RADICAL  NECK DISSECTION    . TONSILLECTOMY     cancer treated with chemo and radiation  . TRIGGER FINGER RELEASE Right 05/19/2017   Procedure: RELEASE TRIGGER FINGER/A-1 PULLEY ring finger;  Surgeon: Earnestine Leys, MD;  Location: [facility];  Service: Orthopedics;  Laterality: Right;   Social History   Tobacco Use  . Smoking status: Former Smoker    Packs/day: 0.50    Years: 6.00    Pack years: 3.00    Types: Cigarettes    Last attempt to quit: 03/10/1984    Years since quitting: 33.7  . Smokeless  tobacco: Never Used  Substance Use Topics  . Alcohol use: No    Alcohol/week: 0.0 standard drinks  . Drug use: No   Family History  Problem Relation Age of Onset  . Osteoporosis Mother   . Hyperlipidemia Mother   . Depression Mother   . Cancer Mother        skin cancer ? basal cell and lung ca heavy smoker  . Alcohol abuse Father   . Cancer Father        skin CA ? basal cell  . Heart disease Father        CHF  . Hyperlipidemia Brother   . Hypertension Brother    Allergies  Allergen Reactions  . Amoxicillin Swelling    REACTION: rash, swelling Has patient had a PCN reaction causing immediate rash, facial/tongue/throat swelling, SOB or lightheadedness with hypotension: yes Has patient had a PCN reaction causing severe rash involving mucus membranes or skin necrosis: no Has patient had a PCN reaction that required hospitalization: no Has patient had a PCN reaction occurring within the last 10 years: yes If all of the above answers are "NO", then may proceed with Cephalosporin use.   [name] Fluconazole Rash    REACTION: hives  . Difuleron [Ferrous Fumarate-Dss]   . 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 ALPRAZolam (XANAX) 0.5 MG tablet Take 1 tablet (0.5 mg total) by mouth at bedtime as needed for sleep. (Patient taking differently: Take 0.5-1 mg by mouth 2 (two) times daily as needed for anxiety or sleep. ) 30 tablet 3  . amitriptyline (ELAVIL) 50 MG tablet Take 1 tablet (50 mg total) by mouth at bedtime. 90 tablet 3  . atorvastatin (LIPITOR) 10 MG tablet Take 1 tablet (10 mg total) by mouth daily. 90 tablet 3  . b complex vitamins tablet Take 1 tablet by mouth daily.      . cyanocobalamin (,VITAMIN B-12,) 1000 MCG/ML injection Inject 1,000 mcg into the muscle every 30 (thirty) days.      . fluticasone (FLONASE) 50 MCG/ACT  nasal spray instill 2 sprays into each nostril once daily 16 g 11  . gabapentin (NEURONTIN) 300 MG capsule TAKE 2 CAPSULES BY MOUTH THREE TIMES DAILY 180 capsule 3  . glipiZIDE (GLUCOTROL) 10 MG tablet Take 10 mg by mouth 2 (two) times daily before a meal.     . JANUMET XR 50-1000 MG TB24 Take 1 tablet by mouth 2 (two) times daily.   0  . lidocaine-prilocaine (EMLA) cream Apply 1 application topically as needed. Apply to port when going through Chemo    . lisinopril (PRINIVIL,ZESTRIL) 5 MG tablet Take 1 tablet (5 mg total) by mouth daily. 90 tablet 3  . magnesium chloride (SLOW-MAG)  64 MG TBEC SR tablet Take 1 tablet by mouth daily as needed (if blood work shows magnesium is low).     . meclizine (ANTIVERT) 25 MG tablet Take 1 tablet (25 mg total) by mouth 3 (three) times daily as needed for dizziness. 30 tablet 0  . omeprazole (PRILOSEC) 20 MG capsule Take 1 capsule (20 mg total) by mouth at bedtime. 90 capsule 3  . rOPINIRole (REQUIP) 1 MG tablet Take 3 tablets (3 mg total) by mouth See admin instructions. 270 tablet 3  . sertraline (ZOLOFT) 100 MG tablet Take 2 tablets (200 mg total) by mouth daily. 60 tablet 2  . spironolactone (ALDACTONE) 25 MG tablet Take 0.5 tablets (12.5 mg total) by mouth 2 (two) times daily as needed. 90 tablet 3   Current Facility-Administered Medications on File Prior to Visit  Medication Dose Route Frequency Provider Last Rate Last Dose  . sodium chloride flush (NS) 0.9 % injection 10 mL  10 mL Intravenous PRN Cammie Sickle, MD   10 mL at 09/11/15 0845    Review of Systems  Constitutional: Negative for activity change, appetite change, fatigue, fever and unexpected weight change.  HENT: Positive for postnasal drip, rhinorrhea and voice change. Negative for congestion, ear pain, sinus pressure, sore throat and trouble swallowing.   Eyes: Negative for pain, redness and visual disturbance.  Respiratory: Positive for cough. Negative for chest tightness,  shortness of breath, wheezing and stridor.   Cardiovascular: Negative for chest pain and palpitations.  Gastrointestinal: Negative for abdominal pain, blood in stool, constipation and diarrhea.  Endocrine: Negative for polydipsia and polyuria.  Genitourinary: Negative for dysuria, frequency and urgency.  Musculoskeletal: Negative for arthralgias, back pain and myalgias.  Skin: Negative for pallor and rash.  Allergic/Immunologic: Negative for environmental allergies.  Neurological: Negative for dizziness, syncope and headaches.  Hematological: Negative for adenopathy. Does not bruise/bleed easily.  Psychiatric/Behavioral: Negative for decreased concentration and dysphoric mood. The patient is not nervous/anxious.        Objective:   Physical Exam  Constitutional: She appears well-developed and well-nourished. No distress.  overwt and well appearing   HENT:  Head: Normocephalic and atraumatic.  Right Ear: External ear normal.  Left Ear: External ear normal.  Nares are boggy Mild clear pnd Baseline mouth/throat dry with surgical changes   Hoarse voice  Eyes: Pupils are equal, round, and reactive to light. Conjunctivae and EOM are normal. No scleral icterus.  Neck: Normal range of motion. Neck supple.  Cardiovascular: Normal rate, regular rhythm and normal heart sounds.  Pulmonary/Chest: Effort normal and breath sounds normal. No stridor. No respiratory distress. She has no wheezes. She has no rales. She exhibits no tenderness.  Harsh bs No rales or rhonchi  Scant end exp wheeze on forced exp only  Lymphadenopathy:    She has no cervical adenopathy.  Neurological: She is alert. No cranial nerve deficit.  Skin: Skin is warm and dry. No rash noted.  Psychiatric: She has a normal mood and affect.          Assessment & Plan:   Problem List Items Addressed This Visit      Respiratory   Acute bronchitis - Primary    With cough for a week and hoarseness Reassuring exam    tussionex px with caution of sedation and habit Fluids/rest Alert if wheeze or sob  Disc symptomatic care - see instructions on AVS  Update if not starting to improve in a week or if worsening    (  pt does take ace and would consider hold if no imp as well)

## 2017-12-17 NOTE — Patient Instructions (Signed)
Drink lots of fluids Watch closely for wheezing - let me know (tight chest)  Rest your voice   Try tussionex for cough - when not working or driving (caution of sedation)  Also get plain mucinex over the counter to loosen phlegm  Update if not starting to improve in a week or if worsening   Especially wheezing or fever  Lisinopril can also cause cough so let us know if you do not improve

## 2017-12-21 ENCOUNTER — Ambulatory Visit: Payer: Medicare Other | Admitting: Family Medicine

## 2017-12-28 ENCOUNTER — Other Ambulatory Visit: Payer: Self-pay | Admitting: Internal Medicine

## 2017-12-28 DIAGNOSIS — C50412 Malignant neoplasm of upper-outer quadrant of left female breast: Secondary | ICD-10-CM

## 2017-12-28 DIAGNOSIS — Z171 Estrogen receptor negative status [ER-]: Principal | ICD-10-CM

## 2018-01-11 ENCOUNTER — Inpatient Hospital Stay: Payer: Medicare Other | Attending: Internal Medicine

## 2018-01-11 DIAGNOSIS — Z171 Estrogen receptor negative status [ER-]: Secondary | ICD-10-CM | POA: Diagnosis not present

## 2018-01-11 DIAGNOSIS — Z452 Encounter for adjustment and management of vascular access device: Secondary | ICD-10-CM | POA: Insufficient documentation

## 2018-01-11 DIAGNOSIS — C50412 Malignant neoplasm of upper-outer quadrant of left female breast: Secondary | ICD-10-CM | POA: Diagnosis not present

## 2018-01-11 DIAGNOSIS — E119 Type 2 diabetes mellitus without complications: Secondary | ICD-10-CM | POA: Diagnosis not present

## 2018-01-11 DIAGNOSIS — Z9221 Personal history of antineoplastic chemotherapy: Secondary | ICD-10-CM | POA: Diagnosis not present

## 2018-01-11 DIAGNOSIS — C801 Malignant (primary) neoplasm, unspecified: Secondary | ICD-10-CM

## 2018-01-11 MED ORDER — HEPARIN SOD (PORK) LOCK FLUSH 100 UNIT/ML IV SOLN
INTRAVENOUS | Status: AC
  Filled 2018-01-11: qty 5

## 2018-01-11 MED ORDER — SODIUM CHLORIDE 0.9% FLUSH
10.0000 mL | INTRAVENOUS | Status: DC | PRN
  Administered 2018-01-11: 10 mL via INTRAVENOUS
  Filled 2018-01-11: qty 10

## 2018-01-11 MED ORDER — HEPARIN SOD (PORK) LOCK FLUSH 100 UNIT/ML IV SOLN
500.0000 [IU] | Freq: Once | INTRAVENOUS | Status: AC
  Administered 2018-01-11: 500 [IU] via INTRAVENOUS

## 2018-01-14 ENCOUNTER — Inpatient Hospital Stay: Payer: Medicare Other

## 2018-01-17 DIAGNOSIS — E119 Type 2 diabetes mellitus without complications: Secondary | ICD-10-CM | POA: Diagnosis not present

## 2018-01-19 ENCOUNTER — Ambulatory Visit (INDEPENDENT_AMBULATORY_CARE_PROVIDER_SITE_OTHER): Payer: Medicare Other | Admitting: Psychiatry

## 2018-01-19 DIAGNOSIS — F411 Generalized anxiety disorder: Secondary | ICD-10-CM

## 2018-01-19 DIAGNOSIS — G4709 Other insomnia: Secondary | ICD-10-CM

## 2018-01-19 MED ORDER — SERTRALINE HCL 100 MG PO TABS: 200.0000 mg | ORAL_TABLET | Freq: Every day | ORAL | 2 refills | Status: DC

## 2018-01-19 MED ORDER — ALPRAZOLAM 0.5 MG PO TABS: 0.5000 mg | ORAL_TABLET | Freq: Two times a day (BID) | ORAL | 2 refills | Status: DC | PRN

## 2018-01-19 MED ORDER — TRAZODONE HCL 50 MG PO TABS: ORAL_TABLET | ORAL | 2 refills | Status: DC

## 2018-01-19 NOTE — Progress Notes (Signed)
[facility]  Patient ID: Suzanne Strong,  MRN: [id number]  PCP: [name], MD  Date of Evaluation: 01/19/2018 Time spent:20 minutes  Chief Complaint:   HISTORY/CURRENT STATUS: HPI patient last seen 10/27/2017.  Echinosis are anxiety and insomnia.  At that time the insomnia was worse.  Started trazodone hs. Currently anxiety and sleep good. Individual Medical History/ Review of Systems: Changes? :No   Allergies: Amoxicillin; Fluconazole; Difuleron [ferrous fumarate-dss]; and Other  Current Medications:  Current Outpatient Medications:  .  ALPRAZolam (XANAX) 0.5 MG tablet, Take 1 tablet (0.5 mg total) by mouth 2 (two) times daily as needed for anxiety or sleep., Disp: 60 tablet, Rfl: 2 .  amitriptyline (ELAVIL) 50 MG tablet, Take 1 tablet (50 mg total) by mouth at bedtime., Disp: 90 tablet, Rfl: 3 .  gabapentin (NEURONTIN) 300 MG capsule, TAKE 2 CAPSULES BY MOUTH THREE TIMES DAILY, Disp: 180 capsule, Rfl: 0 .  rOPINIRole (REQUIP) 1 MG tablet, Take 3 tablets (3 mg total) by mouth See admin instructions., Disp: 270 tablet, Rfl: 3 .  sertraline (ZOLOFT) 100 MG tablet, Take 2 tablets (200 mg total) by mouth daily., Disp: 60 tablet, Rfl: 2 .  atorvastatin (LIPITOR) 10 MG tablet, Take 1 tablet (10 mg total) by mouth daily., Disp: 90 tablet, Rfl: 3 .  b complex vitamins tablet, Take 1 tablet by mouth daily.  , Disp: , Rfl:  .  chlorpheniramine-HYDROcodone (TUSSIONEX PENNKINETIC ER) 10-8 MG/5ML SUER, Take 5 mLs by mouth every 12 (twelve) hours as needed for cough. Caution of sedation, Disp: 70 mL, Rfl: 0 .  cyanocobalamin (,VITAMIN B-12,) 1000 MCG/ML injection, Inject 1,000 mcg into the muscle every 30 (thirty) days.  , Disp: , Rfl:  .  fluticasone (FLONASE) 50 MCG/ACT nasal spray, instill 2 sprays into each nostril once daily, Disp: 16 g, Rfl: 11 .  glipiZIDE (GLUCOTROL) 10 MG tablet, Take 10 mg by mouth 2 (two) times daily before a meal. , Disp: , Rfl:  .  JANUMET XR  50-1000 MG TB24, Take 1 tablet by mouth 2 (two) times daily. , Disp: , Rfl: 0 .  lidocaine-prilocaine (EMLA) cream, Apply 1 application topically as needed. Apply to port when going through Chemo, Disp: , Rfl:  .  lisinopril (PRINIVIL,ZESTRIL) 5 MG tablet, Take 1 tablet (5 mg total) by mouth daily., Disp: 90 tablet, Rfl: 3 .  magnesium chloride (SLOW-MAG) 64 MG TBEC SR tablet, Take 1 tablet by mouth daily as needed (if blood work shows magnesium is low). , Disp: , Rfl:  .  meclizine (ANTIVERT) 25 MG tablet, Take 1 tablet (25 mg total) by mouth 3 (three) times daily as needed for dizziness., Disp: 30 tablet, Rfl: 0 .  omeprazole (PRILOSEC) 20 MG capsule, Take 1 capsule (20 mg total) by mouth at bedtime., Disp: 90 capsule, Rfl: 3 .  spironolactone (ALDACTONE) 25 MG tablet, Take 0.5 tablets (12.5 mg total) by mouth 2 (two) times daily as needed., Disp: 90 tablet, Rfl: 3 .  traZODone (DESYREL) 50 MG tablet, 1-2 hs, Disp: 60 tablet, Rfl: 2 No current facility-administered medications for this visit.   Facility-Administered Medications Ordered in Other Visits:  .  sodium chloride flush (NS) 0.9 % injection 10 mL, 10 mL, Intravenous, PRN, Cammie Sickle, MD, 10 mL at 09/11/15 0845 Medication Side Effects: none  Family Medical/ Social History: Changes? No  MENTAL HEALTH EXAM:  There were no vitals taken for this visit.There is no height or weight on file to calculate BMI.  General Appearance: Casual  Eye Contact:  Good  Speech:  Clear and Coherent  Volume:  Normal  Mood:  Euthymic  Affect:  Appropriate  Thought Process:  Goal Directed  Orientation:  Full (Time, Place, and Person)  Thought Content: Logical   Suicidal Thoughts:  No  Homicidal Thoughts:  No  Memory:  WNL  Judgement:  Good  Insight:  Good  Psychomotor Activity:  Normal  Concentration:  Concentration: Good  Recall:  Good  Fund of Knowledge: Good  Language: Good  Assets:  Social Support  ADL's:  Intact  Cognition:  WNL  Prognosis:  Good    DIAGNOSES:    ICD-10-CM   1. Anxiety state F41.1   2. Other insomnia G47.09     Receiving Psychotherapy: No    RECOMMENDATIONS: Patient is to continue her same medications which include Zoloft 200 mg a day, trazodone 50 mg 1-2 at bedtime, Xanax 0.5 mg, 1 to two daily as needed. Patient to return in 3 months.   Comer Locket, PA-C

## 2018-01-24 ENCOUNTER — Telehealth: Payer: Self-pay | Admitting: General Surgery

## 2018-01-24 NOTE — Telephone Encounter (Signed)
Suzanne Strong is calling and is asking if Dr. Bary Castilla would be able to see her so he could refer the Suzanne Strong to the best person that would work for what she needs to have done. Please call Suzanne Strong and advise.

## 2018-01-24 NOTE — Telephone Encounter (Signed)
Please schedule a visit with me for episodes of weakness - I want to check orthostatic blood pressures  Thanks

## 2018-01-24 NOTE — Telephone Encounter (Signed)
The patient called to report she is having had episodes of weakness.  These date back at least until August.  She was seen by Dr. Rogue Bussing at that time for her regular oncology follow-up and he did arrange for a brain MRI, the report of which was reviewed today and is normal.  He also gave her meclizine (by her report) which he took 3 times a day for a week with no impact on the frequency of these episodes.  These become more frequent, the most recent episode came while [place] the airport.  She describes a sensation as if her muscles are becoming Jell-O like and unless supported she will fall.  By her report, these episodes have all occurred in the standing position, and are more likely to occur if she has been seated for a long period of time and then gets up.  Her blood pressure both at the time of her August visit with medical oncology and her November 1 visit with her PCP was truly normal, and its possible with a very small decrease in blood pressure is causing her to be symptomatic.  The patient does not describe any focal weakness, true vertigo or incontinence.  She has had head and neck radiation in the past, and this could be a manifestation of some extracranial vascular disease.  I have encouraged her to touch base with her PCP and hopefully have postural blood pressure and pulse obtained at a minimum to see if there is any possibility that positional hypotension is contributing to her symptoms.  She does have longstanding diabetes and hypertension, and it is possible that her baro-receptors are not as sensitive as they had been in the past.

## 2018-01-24 NOTE — Telephone Encounter (Signed)
Patient talked with Dr. Bary Castilla

## 2018-01-25 NOTE — Telephone Encounter (Signed)
Thanks for the update.  GB

## 2018-01-26 ENCOUNTER — Encounter: Payer: Self-pay | Admitting: Family Medicine

## 2018-01-26 ENCOUNTER — Ambulatory Visit (INDEPENDENT_AMBULATORY_CARE_PROVIDER_SITE_OTHER): Payer: Medicare Other | Admitting: Family Medicine

## 2018-01-26 VITALS — Temp 98.1°F | Ht 65.5 in | Wt 220.0 lb

## 2018-01-26 DIAGNOSIS — I951 Orthostatic hypotension: Secondary | ICD-10-CM | POA: Diagnosis not present

## 2018-01-26 DIAGNOSIS — R531 Weakness: Secondary | ICD-10-CM | POA: Insufficient documentation

## 2018-01-26 DIAGNOSIS — D509 Iron deficiency anemia, unspecified: Secondary | ICD-10-CM

## 2018-01-26 NOTE — Progress Notes (Signed)
Subjective:    Patient ID: Suzanne Strong, female    DOB: November 04, 1945, 72 y.o.   MRN: [id number]  HPI  Here with c/o of generalized weakness (episodic)  Wt Readings from Last 3 Encounters:  01/26/18 220 lb (99.8 kg)  12/17/17 225 lb 12 oz (102.4 kg)  10/08/17 225 lb (102.1 kg)   36.05 kg/m   Phone note regarding above from chart as follows: Note    The patient called to report she is having had episodes of weakness.  These date back at least until August.  She was seen by Dr. Rogue Bussing at that time for her regular oncology follow-up and he did arrange for a brain MRI, the report of which was reviewed today and is normal.  He also gave her meclizine (by her report) which he took 3 times a day for a week with no impact on the frequency of these episodes.  These become more frequent, the most recent episode came while she was [place] the airport.  She describes a sensation as if her muscles are becoming Jell-O like and unless supported she will fall.  By her report, these episodes have all occurred in the standing position, and are more likely to occur if she has been seated for a long period of time and then gets up.  Her blood pressure both at the time of her August visit with medical oncology and her November 1 visit with her PCP was truly normal, and its possible with a very small decrease in blood pressure is causing her to be symptomatic.  The patient does not describe any focal weakness, true vertigo or incontinence.  She has had head and neck radiation in the past, and this could be a manifestation of some extracranial vascular disease.  I have encouraged her to touch base with her PCP and hopefully have postural blood pressure and pulse obtained at a minimum to see if there is any possibility that positional hypotension is contributing to her symptoms.  She does have longstanding diabetes and hypertension, and it is possible that her baro-receptors are  not as sensitive as they had been in the past.    Has fallen twice  Talked to Dr Gabriel Carina- does not think it is blood sugar related  Dr B did orthostatic blood pressures (per pt) that were normal   Per pt episodes come without warning  She had blurred vision after one  Does not feel faint- her arms and legs feel like jelly (symmetric) Does not feel like she is spinning  Lasts less than a minute    Sees [facility] for psychiatry for anxiety and sleep problems  Taking zoloft 200 mg daily  Trazodone 50-100 at bedtime  Xanax 0.5 mg bid prn  Amitriptyline 50 mg at bedtime   She cannot r/o anxiety as a cause  Around this time of year she is a bit more stressed (many demands on her for events/cooking etc) Is the [employer]r  Work gets more stressful around the holidays   Also takes requip for RLS   Labs Lab Results  Component Value Date   WBC 6.8 10/08/2017   HGB 9.8 (L) 10/08/2017   HCT 30.3 (L) 10/08/2017   MCV 81.5 10/08/2017   PLT 238 10/08/2017  h/o chronic iron def from chemo in the past  Taking B12 shots  Oncology follows this  Lab Results  Component Value Date   VITAMINB12 514 06/29/2017  Lab Results  Component Value Date   TSH 3.29 06/29/2017    Lab Results  Component Value Date   CREATININE 0.84 10/08/2017   BUN 22 10/08/2017   NA 138 10/08/2017   K 4.1 10/08/2017   CL 106 10/08/2017   CO2 24 10/08/2017   Diabetes-sees Dr Gabriel Carina 7.1 A1C in nov  Was on slomag and K briefly -not now (when on chemo)   Today 130/70 lying  120/62 sitting 112/58 standing  Pulse in 70s for all   Lisinopril for renal protection  Aldactone 12.5 mg for edema   (now wearing supp hose)  No hx of HTN   Patient Active Problem List   Diagnosis Date Noted  . Orthostatic hypotension 01/26/2018  . Episodic weakness 01/26/2018  . Medicare annual wellness visit, subsequent 06/29/2017  . Preoperative examination 04/27/2017  . Carpal tunnel syndrome of right wrist  11/13/2016  . Obstructive sleep apnea 01/01/2016  . Carcinoma of upper-outer quadrant of left breast in female, estrogen receptor negative ([place]) 11/21/2015  . Hypomagnesemia 08/28/2015  . Initial Medicare annual wellness visit 10/16/2014  . Estrogen deficiency 10/16/2014  . Colon cancer screening 10/16/2014  . Controlled type 2 diabetes mellitus without complication ([place]) 37/16/9678  . RLS (restless legs syndrome) 02/21/2014  . Chronic neck pain 07/25/2010  . Depression 07/07/2010  . Routine general medical examination at a health care facility 06/26/2010  . B12 deficiency 09/04/2009  . Anemia, iron deficiency 08/29/2009  . Anxiety state 08/26/2009  . GASTROPARESIS 08/26/2009  . Vitamin D deficiency 07/19/2008  . Disorder of bone and cartilage 07/19/2007  . Hyperlipidemia 07/18/2007  . EDEMA 07/18/2007   Past Medical History:  Diagnosis Date  . Anemia    iron deficiency and B12 deficiency  . Anxiety   . Breast cancer ([place]) 02/25/2015   upper-outer quadrant of left female breast, Triple Negative. Neo-adjuvant chemo with complete pathologic response.   . Cervical dysplasia    conization  . Diabetes mellitus without complication ([place])   . Diverticulosis   . Fever 04/11/2015  . Gastroparesis    related to previous radiation tx  . GERD (gastroesophageal reflux disease)   . Hyperlipidemia   . Hypertension   . Shingles    Hx of  . 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 APPENDECTOMY    . BREAST BIOPSY Left 02/25/2015   INVASIVE MAMMARY CARCINOMA,Triple negative.  [name] BREAST CYST ASPIRATION    . BREAST EXCISIONAL BIOPSY Left 12/2015   surgery  . BREAST LUMPECTOMY WITH NEEDLE LOCALIZATION Left 10/02/2015   Procedure: BREAST LUMPECTOMY WITH NEEDLE LOCALIZATION;  Surgeon: [name], MD;  Location: [facility] ORS;  Service: General;  Laterality: Left;  . BREAST LUMPECTOMY WITH SENTINEL LYMPH NODE BIOPSY Left 10/02/2015   Procedure: LEFT  BREAST WIDE EXCISION WITH SENTINEL LYMPH NODE BX;  Surgeon: [name], MD;  Location: [facility] OR[facility];  Service: General;  Laterality: Left;  . BREAST SURGERY     breast biopsy benign  . CARPAL TUNNEL RELEASE Right 05/19/2017   Procedure: CARPAL TUNNEL RELEASE;  Surgeon: Earnestine Leys, MD;  Location: [facility] OR[facility];  Service: Orthopedics;  Laterality: Right;  . CHOLECYSTECTOMY    . Cologuard  07/22/2016   Negative  . ESOPHAGOGASTRODUODENOSCOPY  07/2009   normal with few gastric polyps  . MOLE REMOVAL    . PORT-A-CATH REMOVAL    . PORTACATH PLACEMENT    . PORTACATH PLACEMENT Right 03/12/2015   Procedure: INSERTION PORT-A-CATH;  Surgeon: [name], MD;  Location: [facility] OR[facility];  Service: General;  Laterality: Right;  . RADICAL NECK DISSECTION    . TONSILLECTOMY     cancer treated with chemo and radiation  . TRIGGER FINGER RELEASE Right 05/19/2017   Procedure: RELEASE TRIGGER FINGER/A-1 PULLEY ring finger;  Surgeon: Earnestine Leys, MD;  Location: [facility] OR[facility];  Service: Orthopedics;  Laterality: Right;   Social History   Tobacco Use  . Smoking status: Former Smoker    Packs/day: 0.50    Years: 6.00    Pack years: 3.00    Types: Cigarettes    Last attempt to quit: 03/10/1984    Years since quitting: 33.9  . Smokeless tobacco: Never Used  Substance Use Topics  . Alcohol use: No    Alcohol/week: 0.0 standard drinks  . Drug use: No   Family History  Problem Relation Age of Onset  . Osteoporosis Mother   . Hyperlipidemia Mother   . Depression Mother   . Cancer Mother        skin cancer ? basal cell and lung ca heavy smoker  . Alcohol abuse Father   . Cancer Father        skin CA ? basal cell  . Heart disease Father        CHF  . Hyperlipidemia Brother   . Hypertension Brother    Allergies  Allergen Reactions  . Amoxicillin Swelling    REACTION: rash, swelling Has patient had a PCN reaction causing immediate rash, facial/tongue/throat swelling, SOB or lightheadedness with  hypotension: yes Has patient had a PCN reaction causing severe rash involving mucus membranes or skin necrosis: no Has patient had a PCN reaction that required hospitalization: no Has patient had a PCN reaction occurring within the last 10 years: yes If all of the above answers are "NO", then may proceed with Cephalosporin use.   [name] Fluconazole Rash    REACTION: hives  . Difuleron [Ferrous Fumarate-Dss]   . 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 ALPRAZolam (XANAX) 0.5 MG tablet Take 1 tablet (0.5 mg total) by mouth 2 (two) times daily as needed for anxiety or sleep. 60 tablet 2  . amitriptyline (ELAVIL) 50 MG tablet Take 1 tablet (50 mg total) by mouth at bedtime. 90 tablet 3  . atorvastatin (LIPITOR) 10 MG tablet Take 1 tablet (10 mg total) by mouth daily. 90 tablet 3  . b complex vitamins tablet Take 1 tablet by mouth daily.      . cyanocobalamin (,VITAMIN B-12,) 1000 MCG/ML injection Inject 1,000 mcg into the muscle every 30 (thirty) days.      . fluticasone (FLONASE) 50 MCG/ACT nasal spray instill 2 sprays into each nostril once daily 16 g 11  . gabapentin (NEURONTIN) 300 MG capsule TAKE 2 CAPSULES BY MOUTH THREE TIMES DAILY 180 capsule 0  . glipiZIDE (GLUCOTROL) 10 MG tablet Take 10 mg by mouth 2 (two) times daily before a meal.     . JANUMET XR 50-1000 MG TB24 Take 1 tablet by mouth 2 (two) times daily.   0  . lidocaine-prilocaine (EMLA) cream Apply 1 application topically as needed. Apply to port when going through Chemo    . lisinopril (PRINIVIL,ZESTRIL) 5 MG tablet Take 1 tablet (5 mg total) by mouth daily. 90 tablet 3  . magnesium  chloride (SLOW-MAG) 64 MG TBEC SR tablet Take 1 tablet by mouth daily as needed (if blood work shows magnesium is low).     [name] omeprazole (PRILOSEC) 20 MG capsule Take 1 capsule (20 mg total) by mouth  at bedtime. 90 capsule 3  . rOPINIRole (REQUIP) 1 MG tablet Take 3 tablets (3 mg total) by mouth See admin instructions. 270 tablet 3  . sertraline (ZOLOFT) 100 MG tablet Take 2 tablets (200 mg total) by mouth daily. 60 tablet 2  . spironolactone (ALDACTONE) 25 MG tablet Take 0.5 tablets (12.5 mg total) by mouth 2 (two) times daily as needed. 90 tablet 3  . traZODone (DESYREL) 50 MG tablet 1-2 hs 60 tablet 2   Current Facility-Administered Medications on File Prior to Visit  Medication Dose Route Frequency Provider Last Rate Last Dose  . sodium chloride flush (NS) 0.9 % injection 10 mL  10 mL Intravenous PRN Cammie Sickle, MD   10 mL at 09/11/15 0845    Review of Systems  Constitutional: Negative for activity change, appetite change, fatigue, fever and unexpected weight change.  HENT: Negative for congestion, ear pain, rhinorrhea, sinus pressure and sore throat.   Eyes: Negative for pain, redness and visual disturbance.  Respiratory: Negative for cough, shortness of breath and wheezing.   Cardiovascular: Negative for chest pain and palpitations.  Gastrointestinal: Negative for abdominal pain, blood in stool, constipation and diarrhea.  Endocrine: Negative for polydipsia and polyuria.  Genitourinary: Negative for dysuria, frequency and urgency.  Musculoskeletal: Negative for arthralgias, back pain and myalgias.  Skin: Negative for pallor and rash.  Allergic/Immunologic: Negative for environmental allergies.  Neurological: Positive for weakness and light-headedness. Negative for dizziness, tremors, seizures, syncope, facial asymmetry, speech difficulty, numbness and headaches.       Positional weakness with mild light headed feeling  Hematological: Negative for adenopathy. Does not bruise/bleed easily.  Psychiatric/Behavioral: Negative for decreased concentration and dysphoric mood. The patient is nervous/anxious.        Objective:   Physical Exam  Constitutional: She appears  well-developed and well-nourished. No distress.  obese and well appearing   HENT:  Head: Normocephalic and atraumatic.  Mouth/Throat: Oropharynx is clear and moist.  Eyes: Pupils are equal, round, and reactive to light. Conjunctivae and EOM are normal. No scleral icterus.  Neck: Normal range of motion. Neck supple. No JVD present. Carotid bruit is not present. No thyromegaly present.  Surgical changes to neck/jaw noted  Cardiovascular: Normal rate, regular rhythm, normal heart sounds and intact distal pulses. Exam reveals no gallop.  Pulmonary/Chest: Effort normal and breath sounds normal. No respiratory distress. She has no wheezes. She has no rales.  No crackles  Abdominal: Soft. Bowel sounds are normal. She exhibits no distension, no abdominal bruit and no mass. There is no tenderness.  Musculoskeletal: She exhibits no edema.  Lymphadenopathy:    She has no cervical adenopathy.  Neurological: She is alert. She has normal reflexes. She displays normal reflexes. No cranial nerve deficit or sensory deficit. She exhibits normal muscle tone.  No tremor   Skin: Skin is warm and dry. No rash noted.  Psychiatric: She has a normal mood and affect.  Pleasant   Vitals reviewed.         Assessment & Plan:   Problem List Items Addressed This Visit      Cardiovascular and Mediastinum   Orthostatic hypotension    Orthostatic bp readings 130/70 lying 120/62 sitting  112/58 standing  Some feeling of dizziness Pulse  stable in 70s   Will hold lisinopril and aldactone for now (kept on list however)  Continue supp hose every day F/u 2-3 wk   Hx of neck radiation/cancer tx in past  ? Unsure if baro receptor dysfunction          Other   Episodic weakness    Suspect due to orthostatic hypotension Will hold ace and also aldactone  F/u 2-3 wk  Update if worse or no imp in meantime Disc inc fluids / can liberalize salt for now  supp hose during the day  If no imp at f/u -will check  labs  Consider carotid doppler       Anemia, iron deficiency    Will re check this at next visit if no improvement in episodic weakness with holding ace and aldactone

## 2018-01-26 NOTE — Assessment & Plan Note (Addendum)
Orthostatic bp readings 130/70 lying 120/62 sitting  112/58 standing  Some feeling of dizziness Pulse stable in 70s   Will hold lisinopril and aldactone for now (kept on list however)  Continue supp hose every day F/u 2-3 wk   Hx of neck radiation/cancer tx in past  ? Unsure if baro receptor dysfunction

## 2018-01-26 NOTE — Patient Instructions (Addendum)
Hold the lisinopril and the aldactone for now  (I will leave on list however)  Continue support stockings   Get up and down very carefully - take your time   Make sure you drink enough fluids  64 oz per day or more (mostly water)   If symptoms worsen in the meantime let me know  Follow up in approx 2-3 weeks

## 2018-01-26 NOTE — Assessment & Plan Note (Signed)
Suspect due to orthostatic hypotension Will hold ace and also aldactone  F/u 2-3 wk  Update if worse or no imp in meantime Disc inc fluids / can liberalize salt for now  supp hose during the day  If no imp at f/u -will check labs  Consider carotid doppler

## 2018-01-26 NOTE — Assessment & Plan Note (Signed)
Will re check this at next visit if no improvement in episodic weakness with holding ace and aldactone

## 2018-02-01 ENCOUNTER — Other Ambulatory Visit: Payer: Self-pay | Admitting: Internal Medicine

## 2018-02-01 DIAGNOSIS — C50412 Malignant neoplasm of upper-outer quadrant of left female breast: Secondary | ICD-10-CM

## 2018-02-01 DIAGNOSIS — Z171 Estrogen receptor negative status [ER-]: Principal | ICD-10-CM

## 2018-02-07 ENCOUNTER — Ambulatory Visit (INDEPENDENT_AMBULATORY_CARE_PROVIDER_SITE_OTHER): Payer: Medicare Other | Admitting: Family Medicine

## 2018-02-07 ENCOUNTER — Encounter: Payer: Self-pay | Admitting: Family Medicine

## 2018-02-07 VITALS — BP 122/70 | HR 83 | Temp 97.6°F | Ht 65.5 in | Wt 223.8 lb

## 2018-02-07 DIAGNOSIS — R55 Syncope and collapse: Secondary | ICD-10-CM | POA: Diagnosis not present

## 2018-02-07 DIAGNOSIS — I951 Orthostatic hypotension: Secondary | ICD-10-CM

## 2018-02-07 DIAGNOSIS — D509 Iron deficiency anemia, unspecified: Secondary | ICD-10-CM

## 2018-02-07 LAB — COMPREHENSIVE METABOLIC PANEL
ALBUMIN: 3.6 g/dL (ref 3.5–5.2)
ALT: 10 U/L (ref 0–35)
AST: 12 U/L (ref 0–37)
Alkaline Phosphatase: 75 U/L (ref 39–117)
BILIRUBIN TOTAL: 0.2 mg/dL (ref 0.2–1.2)
BUN: 17 mg/dL (ref 6–23)
CHLORIDE: 105 meq/L (ref 96–112)
CO2: 28 mEq/L (ref 19–32)
Calcium: 9 mg/dL (ref 8.4–10.5)
Creatinine, Ser: 0.91 mg/dL (ref 0.40–1.20)
GFR: 64.92 mL/min (ref >60.00)
Glucose, Bld: 113 mg/dL — ABNORMAL HIGH (ref 70–99)
Potassium: 4.2 mEq/L (ref 3.5–5.1)
SODIUM: 140 meq/L (ref 135–145)
TOTAL PROTEIN: 6.9 g/dL (ref 6.0–8.3)

## 2018-02-07 LAB — CBC WITH DIFFERENTIAL/PLATELET
BASOS PCT: 0.5 % (ref 0.0–3.0)
Basophils Absolute: 0 10*3/uL (ref 0.0–0.1)
EOS ABS: 0.2 10*3/uL (ref 0.0–0.7)
Eosinophils Relative: 2.9 % (ref 0.0–5.0)
HCT: 29.4 % — ABNORMAL LOW (ref 36.0–46.0)
HEMOGLOBIN: 9.6 g/dL — AB (ref 12.0–15.0)
Lymphocytes Relative: 15.5 % (ref 12.0–46.0)
Lymphs Abs: 0.9 10*3/uL (ref 0.7–4.0)
MCHC: 32.8 g/dL (ref 30.0–36.0)
MCV: 81.9 fl (ref 78.0–100.0)
Monocytes Absolute: 0.3 10*3/uL (ref 0.1–1.0)
Monocytes Relative: 5.6 % (ref 3.0–12.0)
Neutro Abs: 4.4 10*3/uL (ref 1.4–7.7)
Neutrophils Relative %: 75.5 % (ref 43.0–77.0)
PLATELETS: 223 10*3/uL (ref 150.0–400.0)
RBC: 3.59 Mil/uL — ABNORMAL LOW (ref 3.87–5.11)
RDW: 17.2 % — AB (ref 11.5–15.5)
WBC: 5.8 10*3/uL (ref 4.0–10.5)

## 2018-02-07 LAB — FERRITIN: Ferritin: 10.6 ng/mL (ref 10.0–291.0)

## 2018-02-07 NOTE — Assessment & Plan Note (Signed)
Cbc with ferritin today  This has been chronic since cancer treatment She continues to take iron

## 2018-02-07 NOTE — Progress Notes (Signed)
Subjective:    Patient ID: Suzanne Strong, female    DOB: [birth year], 72 y.o.   MRN: [id number]  HPI  Here for follow up  Last visit earlier this mo- diagnosed with episodic orthostatic hypotension   Wt Readings from Last 3 Encounters:  02/07/18 223 lb 12 oz (101.5 kg)  01/26/18 220 lb (99.8 kg)  12/17/17 225 lb 12 oz (102.4 kg)   36.67 kg/m   Planned to hold ace and aldactone  Told to liberalize salt and drink water   Wanted to check labs today if no improvement clinically   BP Readings from Last 3 Encounters:  02/07/18 140/64  12/17/17 118/70  10/08/17 122/67   She is still having some episodes but not as many  Still  2 big episodes  One this past Saturday (after being in and out of the car)- getting out  Other one was prev Wednesday ([place] the [place] -after being in the car)   Smaller episodes -happen when more stressed or tired (when she looks down and then back up)  No falls at all   Patient Active Problem List   Diagnosis Date Noted  . Pre-syncope 02/07/2018  . Orthostatic hypotension 01/26/2018  . Episodic weakness 01/26/2018  . Medicare annual wellness visit, subsequent 06/29/2017  . Preoperative examination 04/27/2017  . Carpal tunnel syndrome of right wrist 11/13/2016  . Obstructive sleep apnea 01/01/2016  . Carcinoma of upper-outer quadrant of left breast in female, estrogen receptor negative ([place]) 11/21/2015  . Hypomagnesemia 08/28/2015  . Initial Medicare annual wellness visit 10/16/2014  . Estrogen deficiency 10/16/2014  . Colon cancer screening 10/16/2014  . Controlled type 2 diabetes mellitus without complication ([place]) 76/54/6503  . RLS (restless legs syndrome) 02/21/2014  . Chronic neck pain 07/25/2010  . Depression 07/07/2010  . Routine general medical examination at a health care facility 06/26/2010  . B12 deficiency 09/04/2009  . Anemia, iron deficiency 08/29/2009  . Anxiety state 08/26/2009  . GASTROPARESIS 08/26/2009  . Vitamin D  deficiency 07/19/2008  . Disorder of bone and cartilage 07/19/2007  . Hyperlipidemia 07/18/2007  . EDEMA 07/18/2007   Past Medical History:  Diagnosis Date  . Anemia    iron deficiency and B12 deficiency  . Anxiety   . Breast cancer ([place]) 02/25/2015   upper-outer quadrant of left female breast, Triple Negative. Neo-adjuvant chemo with complete pathologic response.   . Cervical dysplasia    conization  . Diabetes mellitus without complication ([place])   . Diverticulosis   . Fever 04/11/2015  . Gastroparesis    related to previous radiation tx  . GERD (gastroesophageal reflux disease)   . Hyperlipidemia   . Hypertension   . Shingles    Hx of  . 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 APPENDECTOMY    . BREAST BIOPSY Left 02/25/2015   INVASIVE MAMMARY CARCINOMA,Triple negative.  [name] BREAST CYST ASPIRATION    . BREAST EXCISIONAL BIOPSY Left 12/2015   surgery  . BREAST LUMPECTOMY WITH NEEDLE LOCALIZATION Left 10/02/2015   Procedure: BREAST LUMPECTOMY WITH NEEDLE LOCALIZATION;  Surgeon: [name], MD;  Location: [facility] ORS;  Service: General;  Laterality: Left;  . BREAST LUMPECTOMY WITH SENTINEL LYMPH NODE BIOPSY Left 10/02/2015   Procedure: LEFT BREAST WIDE EXCISION WITH SENTINEL LYMPH NODE BX;  Surgeon: [name], MD;  Location: [facility] ORS;  Service: General;  Laterality: Left;  . BREAST SURGERY     breast biopsy benign  .  CARPAL TUNNEL RELEASE Right 05/19/2017   Procedure: CARPAL TUNNEL RELEASE;  Surgeon: Earnestine Leys, MD;  Location: [facility] ORS;  Service: Orthopedics;  Laterality: Right;  . CHOLECYSTECTOMY    . Cologuard  07/22/2016   Negative  . ESOPHAGOGASTRODUODENOSCOPY  07/2009   normal with few gastric polyps  . MOLE REMOVAL    . PORT-A-CATH REMOVAL    . PORTACATH PLACEMENT    . PORTACATH PLACEMENT Right 03/12/2015   Procedure: INSERTION PORT-A-CATH;  Surgeon: [name], MD;  Location: [facility] ORS;  Service: General;   Laterality: Right;  . RADICAL NECK DISSECTION    . TONSILLECTOMY     cancer treated with chemo and radiation  . TRIGGER FINGER RELEASE Right 05/19/2017   Procedure: RELEASE TRIGGER FINGER/A-1 PULLEY ring finger;  Surgeon: Earnestine Leys, MD;  Location: [facility] ORS;  Service: Orthopedics;  Laterality: Right;   Social History   Tobacco Use  . Smoking status: Former Smoker    Packs/day: 0.50    Years: 6.00    Pack years: 3.00    Types: Cigarettes    Last attempt to quit: 03/10/1984    Years since quitting: 33.9  . Smokeless tobacco: Never Used  Substance Use Topics  . Alcohol use: No    Alcohol/week: 0.0 standard drinks  . Drug use: No   Family History  Problem Relation Age of Onset  . Osteoporosis Mother   . Hyperlipidemia Mother   . Depression Mother   . Cancer Mother        skin cancer ? basal cell and lung ca heavy smoker  . Alcohol abuse Father   . Cancer Father        skin CA ? basal cell  . Heart disease Father        CHF  . Hyperlipidemia Brother   . Hypertension Brother    Allergies  Allergen Reactions  . Amoxicillin Swelling    REACTION: rash, swelling Has patient had a PCN reaction causing immediate rash, facial/tongue/throat swelling, SOB or lightheadedness with hypotension: yes Has patient had a PCN reaction causing severe rash involving mucus membranes or skin necrosis: no Has patient had a PCN reaction that required hospitalization: no Has patient had a PCN reaction occurring within the last 10 years: yes If all of the above answers are "NO", then may proceed with Cephalosporin use.   [name] Fluconazole Rash    REACTION: hives  . Difuleron [Ferrous Fumarate-Dss]   . 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 ALPRAZolam (XANAX) 0.5 MG tablet Take 1 tablet (0.5 mg total) by mouth 2 (two) times daily as needed  for anxiety or sleep. 60 tablet 2  . amitriptyline (ELAVIL) 50 MG tablet Take 1 tablet (50 mg total) by mouth at bedtime. 90 tablet 3  . atorvastatin (LIPITOR) 10 MG tablet Take 1 tablet (10 mg total) by mouth daily. 90 tablet 3  . b complex vitamins tablet Take 1 tablet by mouth daily.      . cyanocobalamin (,VITAMIN B-12,) 1000 MCG/ML injection Inject 1,000 mcg into the muscle every 30 (thirty) days.      . fluticasone (FLONASE) 50 MCG/ACT nasal spray instill 2 sprays into each nostril once daily 16 g 11  . gabapentin (NEURONTIN) 300 MG capsule TAKE 2 CAPSULES BY MOUTH THREE TIMES DAILY 180 capsule 0  .  glipiZIDE (GLUCOTROL) 10 MG tablet Take 10 mg by mouth 2 (two) times daily before a meal.     . JANUMET XR 50-1000 MG TB24 Take 1 tablet by mouth 2 (two) times daily.   0  . lidocaine-prilocaine (EMLA) cream Apply 1 application topically as needed. Apply to port when going through Chemo    . magnesium chloride (SLOW-MAG) 64 MG TBEC SR tablet Take 1 tablet by mouth daily as needed (if blood work shows magnesium is low).     [name] omeprazole (PRILOSEC) 20 MG capsule Take 1 capsule (20 mg total) by mouth at bedtime. 90 capsule 3  . rOPINIRole (REQUIP) 1 MG tablet Take 3 tablets (3 mg total) by mouth See admin instructions. 270 tablet 3  . sertraline (ZOLOFT) 100 MG tablet Take 2 tablets (200 mg total) by mouth daily. 60 tablet 2  . traZODone (DESYREL) 50 MG tablet 1-2 hs 60 tablet 2   Current Facility-Administered Medications on File Prior to Visit  Medication Dose Route Frequency Provider Last Rate Last Dose  . sodium chloride flush (NS) 0.9 % injection 10 mL  10 mL Intravenous PRN Cammie Sickle, MD   10 mL at 09/11/15 0845     Review of Systems  Constitutional: Positive for fatigue. Negative for activity change, appetite change, fever and unexpected weight change.  HENT: Negative for congestion, ear pain, rhinorrhea, sinus pressure and sore throat.        Chronic dry mouth s/p tx of  tonsillar cancer  Eyes: Negative for pain, redness and visual disturbance.  Respiratory: Negative for cough, shortness of breath and wheezing.   Cardiovascular: Negative for chest pain, palpitations and leg swelling.  Gastrointestinal: Negative for abdominal pain, blood in stool, constipation and diarrhea.  Endocrine: Negative for polydipsia and polyuria.  Genitourinary: Negative for dysuria, frequency and urgency.  Musculoskeletal: Negative for arthralgias, back pain and myalgias.  Skin: Negative for pallor and rash.  Allergic/Immunologic: Negative for environmental allergies.  Neurological: Positive for dizziness and light-headedness. Negative for tremors, seizures, syncope, facial asymmetry, speech difficulty, weakness, numbness and headaches.       Episodic  Hematological: Negative for adenopathy. Does not bruise/bleed easily.  Psychiatric/Behavioral: Negative for decreased concentration and dysphoric mood. The patient is not nervous/anxious.        Objective:   Physical Exam Constitutional:      General: She is not in acute distress.    Appearance: She is well-developed. She is not ill-appearing or diaphoretic.  HENT:     Head: Normocephalic and atraumatic.     Mouth/Throat:     Comments: Mildly dry MM - baseline (with surgical changes from past cancer tx)  Eyes:     Extraocular Movements: Extraocular movements intact.     Conjunctiva/sclera: Conjunctivae normal.     Pupils: Pupils are equal, round, and reactive to light.     Comments: No nystagmus  Neck:     Musculoskeletal: Normal range of motion and neck supple.     Thyroid: No thyromegaly.     Vascular: No carotid bruit or JVD.     Comments: R sided sound- ? Bruit vs referred M Cardiovascular:     Rate and Rhythm: Normal rate and regular rhythm.     Pulses: Normal pulses.     Heart sounds: Normal heart sounds. No gallop.   Pulmonary:     Effort: Pulmonary effort is normal. No respiratory distress.     Breath sounds:  Normal breath sounds. No wheezing, rhonchi or rales.  Abdominal:     General: Bowel sounds are normal. There is no distension or abdominal bruit.     Palpations: Abdomen is soft. There is no mass.     Tenderness: There is no abdominal tenderness.  Lymphadenopathy:     Cervical: No cervical adenopathy.  Skin:    General: Skin is warm and dry.     Capillary Refill: Capillary refill takes less than 2 seconds.     Coloration: Skin is not pale.     Findings: No rash.  Neurological:     General: No focal deficit present.     Mental Status: She is alert.     Cranial Nerves: No cranial nerve deficit.     Motor: No weakness.     Coordination: Coordination normal.     Gait: Gait normal.     Deep Tendon Reflexes: Reflexes are normal and symmetric. Reflexes normal.     Comments: No dizziness today   Psychiatric:        Mood and Affect: Mood normal.     Comments: Pleasant            Assessment & Plan:   Problem List Items Addressed This Visit      Cardiovascular and Mediastinum   Pre-syncope - Primary    Ongoing  Overall improved with imp in orthostatic hypotension -but still occurring  No falls thankfully  Again reviewed safety with standing and pos change Hx of extensive neck radiation for cancer tx in the past - will check carotid US  Also cbc (to check anemia-chronic) and cmet today  Reassuring exam       Relevant Orders   VAS US CAROTID   Comprehensive metabolic panel   CBC with Differential/Platelet   Orthostatic hypotension    Improved off of aldactone and lisinopril  BP: 122/70  Still having presyncopal episodes  Ordered carotid US  Also cbc and cmet today  Disc safety when changing positions         Other   Anemia, iron deficiency    Cbc with ferritin today  This has been chronic since cancer treatment She continues to take iron       Relevant Orders   CBC with Differential/Platelet   Ferritin

## 2018-02-07 NOTE — Patient Instructions (Signed)
We will refer you for a carotid test to look for blockage  Also labs today   If episodes of dizziness worsen-please let us know Change position slowly- upon standing , take a break before walking  Don't "jump up" to do things

## 2018-02-07 NOTE — Assessment & Plan Note (Signed)
Ongoing  Overall improved with imp in orthostatic hypotension -but still occurring  No falls thankfully  Again reviewed safety with standing and pos change Hx of extensive neck radiation for cancer tx in the past - will check carotid US  Also cbc (to check anemia-chronic) and cmet today  Reassuring exam

## 2018-02-07 NOTE — Assessment & Plan Note (Signed)
Improved off of aldactone and lisinopril  BP: 122/70  Still having presyncopal episodes  Ordered carotid US  Also cbc and cmet today  Disc safety when changing positions

## 2018-02-15 ENCOUNTER — Other Ambulatory Visit: Payer: Self-pay

## 2018-02-15 DIAGNOSIS — C50412 Malignant neoplasm of upper-outer quadrant of left female breast: Secondary | ICD-10-CM

## 2018-02-15 DIAGNOSIS — Z171 Estrogen receptor negative status [ER-]: Principal | ICD-10-CM

## 2018-03-04 ENCOUNTER — Inpatient Hospital Stay: Payer: PPO | Attending: Internal Medicine

## 2018-03-04 DIAGNOSIS — Z171 Estrogen receptor negative status [ER-]: Secondary | ICD-10-CM | POA: Insufficient documentation

## 2018-03-04 DIAGNOSIS — C50412 Malignant neoplasm of upper-outer quadrant of left female breast: Secondary | ICD-10-CM | POA: Insufficient documentation

## 2018-03-04 DIAGNOSIS — Z9221 Personal history of antineoplastic chemotherapy: Secondary | ICD-10-CM | POA: Insufficient documentation

## 2018-03-04 DIAGNOSIS — Z95828 Presence of other vascular implants and grafts: Secondary | ICD-10-CM

## 2018-03-04 MED ORDER — SODIUM CHLORIDE 0.9% FLUSH
10.0000 mL | Freq: Once | INTRAVENOUS | Status: AC
  Administered 2018-03-04: 10 mL via INTRAVENOUS
  Filled 2018-03-04: qty 10

## 2018-03-04 MED ORDER — HEPARIN SOD (PORK) LOCK FLUSH 100 UNIT/ML IV SOLN
500.0000 [IU] | Freq: Once | INTRAVENOUS | Status: AC
  Administered 2018-03-04: 500 [IU] via INTRAVENOUS
  Filled 2018-03-04: qty 5

## 2018-03-06 ENCOUNTER — Other Ambulatory Visit: Payer: Self-pay | Admitting: Psychiatry

## 2018-03-07 NOTE — Telephone Encounter (Signed)
Need to clarify not on med profile

## 2018-03-07 NOTE — Telephone Encounter (Signed)
Does pt still take? Not on last visit

## 2018-03-08 ENCOUNTER — Ambulatory Visit (INDEPENDENT_AMBULATORY_CARE_PROVIDER_SITE_OTHER): Payer: PPO

## 2018-03-08 DIAGNOSIS — R55 Syncope and collapse: Secondary | ICD-10-CM | POA: Diagnosis not present

## 2018-03-09 ENCOUNTER — Telehealth: Payer: Self-pay | Admitting: *Deleted

## 2018-03-09 ENCOUNTER — Telehealth: Payer: Self-pay | Admitting: Family Medicine

## 2018-03-09 DIAGNOSIS — R55 Syncope and collapse: Secondary | ICD-10-CM

## 2018-03-09 DIAGNOSIS — R531 Weakness: Secondary | ICD-10-CM

## 2018-03-09 NOTE — Telephone Encounter (Signed)
Referral done Will route to [facility]  

## 2018-03-09 NOTE — Telephone Encounter (Signed)
Left VM requesting pt to call the office back regarding Carotid US results

## 2018-03-09 NOTE — Telephone Encounter (Signed)
Addressed in result notes  

## 2018-03-09 NOTE — Telephone Encounter (Signed)
Pt returned call to [place]. °

## 2018-03-09 NOTE — Telephone Encounter (Signed)
-----   Message from Tammi Sou, [place] sent at 03/09/2018  4:33 PM EST ----- Pt agrees with referral she said Dr. Glori Bickers referred her to Dr. Narda Amber in the past and would like to go back to her please put referral in and I advise pt our [facility] will call to schedule appt

## 2018-03-10 NOTE — Telephone Encounter (Signed)
Sent Referral to [facility] and put on their [facility].

## 2018-04-05 ENCOUNTER — Other Ambulatory Visit: Payer: Self-pay | Admitting: Internal Medicine

## 2018-04-05 DIAGNOSIS — R42 Dizziness and giddiness: Secondary | ICD-10-CM | POA: Diagnosis not present

## 2018-04-05 DIAGNOSIS — C50412 Malignant neoplasm of upper-outer quadrant of left female breast: Secondary | ICD-10-CM

## 2018-04-05 DIAGNOSIS — Z171 Estrogen receptor negative status [ER-]: Principal | ICD-10-CM

## 2018-04-05 DIAGNOSIS — G609 Hereditary and idiopathic neuropathy, unspecified: Secondary | ICD-10-CM | POA: Diagnosis not present

## 2018-04-08 ENCOUNTER — Ambulatory Visit
Admission: RE | Admit: 2018-04-08 | Discharge: 2018-04-08 | Disposition: A | Discharge status: Home / Self Care | Payer: PPO | Source: Ambulatory Visit | Attending: Surgery | Admitting: Surgery

## 2018-04-08 DIAGNOSIS — C50412 Malignant neoplasm of upper-outer quadrant of left female breast: Secondary | ICD-10-CM | POA: Diagnosis not present

## 2018-04-08 DIAGNOSIS — Z171 Estrogen receptor negative status [ER-]: Secondary | ICD-10-CM

## 2018-04-08 DIAGNOSIS — Z853 Personal history of malignant neoplasm of breast: Secondary | ICD-10-CM | POA: Diagnosis not present

## 2018-04-08 DIAGNOSIS — R928 Other abnormal and inconclusive findings on diagnostic imaging of breast: Secondary | ICD-10-CM | POA: Diagnosis not present

## 2018-04-08 HISTORY — DX: Personal history of irradiation: Z92.3

## 2018-04-10 NOTE — Progress Notes (Signed)
Follow-up Visit   Date: 04/11/18   Suzanne Strong MRN: [id number] DOB: 1945/12/08   Interim History: Suzanne Strong is a 73 y.o. right-handed Caucasian female with squamous cell carcinoma of the tonsils (2007), left breast cancer (2017) s/p chemotherapy, radiation, and lumpectomy, diabetes mellitus, (HbA1c 8.0), hyperlipidemia, anxiety/depression, GERD, and RLS returning to the clinic for follow-up of gait imbalance and falls.  The patient was accompanied to the clinic by self.  History of present illness: She was initially evaluated in 2018 for right hand pain, due to CTS.  She also has chemotherapy-induced neuropathy.  She completed chemotherapy with cytoxan and adriamycin (January - April 2017) followed by carboplatin and taxol x 4 cycles (April 2017 - November 2017).  During the last two cycles, she recalls having difficulty with buttoning because of inability to feel her fingers.  She also started developing numbness and tingling of the feet.  She was hoping that symptoms would have improved after completing chemotherapy, but her pain persisted.  She was started on Cymbalta, but did not tolerate this.  She takes gabapentin 600mg  twice daily and amitriptyline 50mg  at bedtime which has provided some relief. She no longer has painful tingling of the feet or left hand, but it continues to feel numb and cold to the level of the ankles.  She is mostly bothered by severe sharp shooting pain of the right hand, especially thumb, index finger, and middle finger, which often wakes her up from sleeping.  She had EDX of the right hand which showed moderate right carpal tunnel syndrome.  UPDATE 04/11/2018:  She is here with new complaints of imbalance, which started in October 2019.  She has suffered three falls when going from a sitting to standing position, and then develops shakiness of the legs and arms, as if they were weak and then falls.  She does not have dizziness or lightheadedness.  This  occurs about 1-2 times per week and last < 1 min. Her PCP tapered her off two of her blood pressure medications, which may have helped because the spells are not as intense.  There is no loss of consciousness.  She can continue to talk through these spells and there is no confusion.  MRI brain and US carotids is normal.  She was evaluated by Dr. Beverly Gust, ENT, for dizziness, described as imbalance.  She does have tinnitus.  This evaluation suggested symptoms of more most likely due to peripheral neuropathy more so than vertigo.  Her right CTS is doing much better after surgery. She has intermittent paresthesias, which is improved.    Medications:  Current Outpatient Medications on File Prior to Visit  Medication Sig Dispense Refill  . ALPRAZolam (XANAX) 0.5 MG tablet Take 1 tablet (0.5 mg total) by mouth 2 (two) times daily as needed for anxiety or sleep. 60 tablet 2  . amitriptyline (ELAVIL) 50 MG tablet Take 1 tablet (50 mg total) by mouth at bedtime. 90 tablet 3  . atorvastatin (LIPITOR) 10 MG tablet Take 1 tablet (10 mg total) by mouth daily. 90 tablet 3  . b complex vitamins tablet Take 1 tablet by mouth daily.      . cyanocobalamin (,VITAMIN B-12,) 1000 MCG/ML injection Inject 1,000 mcg into the muscle every 30 (thirty) days.      . fluticasone (FLONASE) 50 MCG/ACT nasal spray instill 2 sprays into each nostril once daily 16 g 11  . gabapentin (NEURONTIN) 300 MG capsule TAKE 2 CAPSULES BY MOUTH THREE TIMES  DAILY (Patient taking differently: 900 mg at bedtime. ) 180 capsule 0  . glipiZIDE (GLUCOTROL) 10 MG tablet Take 10 mg by mouth 2 (two) times daily before a meal.     . JANUMET XR 50-1000 MG TB24 Take 1 tablet by mouth 2 (two) times daily.   0  . lidocaine-prilocaine (EMLA) cream Apply 1 application topically as needed. Apply to port when going through Chemo    . omeprazole (PRILOSEC) 20 MG capsule Take 1 capsule (20 mg total) by mouth at bedtime. 90 capsule 3  . rOPINIRole  (REQUIP) 1 MG tablet Take 3 tablets (3 mg total) by mouth See admin instructions. 270 tablet 3  . sertraline (ZOLOFT) 100 MG tablet Take 2 tablets (200 mg total) by mouth daily. 60 tablet 2  . traZODone (DESYREL) 50 MG tablet 1-2 hs 60 tablet 2   Current Facility-Administered Medications on File Prior to Visit  Medication Dose Route Frequency Provider Last Rate Last Dose  . sodium chloride flush (NS) 0.9 % injection 10 mL  10 mL Intravenous PRN Cammie Sickle, MD   10 mL at 09/11/15 0845    Allergies:  Allergies  Allergen Reactions  . Amoxicillin Swelling    REACTION: rash, swelling Has patient had a PCN reaction causing immediate rash, facial/tongue/throat swelling, SOB or lightheadedness with hypotension: yes Has patient had a PCN reaction causing severe rash involving mucus membranes or skin necrosis: no Has patient had a PCN reaction that required hospitalization: no Has patient had a PCN reaction occurring within the last 10 years: yes If all of the above answers are "NO", then may proceed with Cephalosporin use.   [name] Fluconazole Rash    REACTION: hives  . Difuleron [Ferrous Fumarate-Dss]   . Other Other (See Comments)    Pt has had tonsil cancer (on Left side) - pt may have difficulty swallowing    Review of Systems:  CONSTITUTIONAL: No fevers, chills, night sweats, or weight loss.  EYES: No visual changes or eye pain ENT: No hearing changes.  No history of nose bleeds.   RESPIRATORY: No cough, wheezing and shortness of breath.   CARDIOVASCULAR: Negative for chest pain, and palpitations.   GI: Negative for abdominal discomfort, blood in stools or black stools.  No recent change in bowel habits.   GU:  No history of incontinence.   MUSCLOSKELETAL: No history of joint pain or swelling.  No myalgias.   SKIN: Negative for lesions, rash, and itching.   ENDOCRINE: Negative for cold or heat intolerance, polydipsia or goiter.   PSYCH:  No depression or anxiety symptoms.     NEURO: As Above.   Vital Signs:  BP 140/82   Pulse 84   Ht 5' 5.5" (1.664 m)   Wt 199 lb (90.3 kg)   SpO2 92%   BMI 32.61 kg/m  Orthostatic VS for the past 24 hrs (Last 3 readings):  BP- Lying Pulse- Lying BP- Sitting Pulse- Sitting BP- Standing at 0 minutes Pulse- Standing at 0 minutes  04/11/18 1603 130/70 81 128/70 80 130/70 83    General Medical Exam:   General:  Well appearing, comfortable  Eyes/ENT: see cranial nerve examination.   Neck:  No carotid bruits. Respiratory:  Clear to auscultation, good air entry bilaterally.   Cardiac:  Regular rate and rhythm, no murmur.   Ext:  No edema   Neurological Exam: MENTAL STATUS including orientation to time, place, person, recent and remote memory, attention span and concentration, language, and fund of  knowledge is normal.  Speech is not dysarthric, moderate horseness  CRANIAL NERVES:   Pupils equal round and reactive to light.  Normal conjugate, extra-ocular eye movements in all directions of gaze.  No ptosis.  Face is symmetric. Palate elevates symmetrically.  Tongue is midline.  MOTOR:  Motor strength is 5/5 in all extremities.  No atrophy, fasciculations or abnormal movements.  No pronator drift.  Tone is normal.    MSRs:  Right                                                                 Left brachioradialis 2+  brachioradialis 2+  biceps 2+  biceps 2+  triceps 2+  triceps 2+  patellar 1+  Patellar tr  ankle jerk 0  ankle jerk 0   SENSORY:  Intact to vibration throughout at the ankles, reduced at great toe.  Rhomberg sign is normal.  COORDINATION/GAIT:  Normal finger-to- nose-finger.  Intact rapid alternating movements bilaterally.  Gait narrow based and stable.  Tandem and stressed gait intact.  Data: NCS/EMG of the right hand 11/13/2016 1. Right median neuropathy at or distal to the wrist, consistent with the clinical diagnosis of carpal tunnel syndrome. Overall, these findings are severe in degree  electrically. 2. There is no evidence of a sensorimotor polyneuropathy affecting the right upper extremity.  MRI brain wwo contrast 10/21/2017:  Unremarkable US carotids 03/08/2018:  Normal  IMPRESSION/PLAN: 1. 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  I have asked her to stay well-hydrated, continue wearing compression stockings, and make positional changes very slowly. Prior work-up including MRI brain and US carotids is very reassuring.   2.  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 Mild and stable.  She will be starting PT for balance training.  3.  Right carpal tunnel syndrome s/p release, which has improved right hand paresthesias.  Return to clinic in 4 months.   Greater than 50% of this 25 minute visit was spent in counseling, explanation of diagnosis, planning of further management, and coordination of care.   Thank you for allowing me to participate in patient's care.  If I can answer any additional questions, I would be pleased to do so.    Sincerely,    Anetta Olvera K. Posey Pronto, DO

## 2018-04-11 ENCOUNTER — Ambulatory Visit (INDEPENDENT_AMBULATORY_CARE_PROVIDER_SITE_OTHER): Payer: PPO | Admitting: Neurology

## 2018-04-11 ENCOUNTER — Encounter: Payer: Self-pay | Admitting: Neurology

## 2018-04-11 VITALS — BP 140/82 | HR 84 | Ht 65.5 in | Wt 199.0 lb

## 2018-04-11 DIAGNOSIS — G620 Drug-induced polyneuropathy: Secondary | ICD-10-CM

## 2018-04-11 DIAGNOSIS — I951 Orthostatic hypotension: Secondary | ICD-10-CM | POA: Diagnosis not present

## 2018-04-11 DIAGNOSIS — T451X5A Adverse effect of antineoplastic and immunosuppressive drugs, initial encounter: Secondary | ICD-10-CM | POA: Diagnosis not present

## 2018-04-11 NOTE — Patient Instructions (Signed)
Make positional changes slowly  Return to clinic in 4 months

## 2018-04-12 ENCOUNTER — Ambulatory Visit: Payer: PPO | Admitting: General Surgery

## 2018-04-12 ENCOUNTER — Encounter: Payer: Self-pay | Admitting: General Surgery

## 2018-04-12 ENCOUNTER — Other Ambulatory Visit: Payer: Self-pay

## 2018-04-12 VITALS — BP 124/70 | HR 99 | Temp 97.7°F | Resp 12 | Ht 66.0 in | Wt 217.0 lb

## 2018-04-12 DIAGNOSIS — Z171 Estrogen receptor negative status [ER-]: Secondary | ICD-10-CM

## 2018-04-12 DIAGNOSIS — Z9181 History of falling: Secondary | ICD-10-CM | POA: Diagnosis not present

## 2018-04-12 DIAGNOSIS — C50412 Malignant neoplasm of upper-outer quadrant of left female breast: Secondary | ICD-10-CM

## 2018-04-12 NOTE — Patient Instructions (Addendum)
   Patient to return for supraclavicular biopsy   The patient has been asked to return to the office in one year with a bilateral diagnostic mammogram.The patient is aware to call back for any questions or concerns.

## 2018-04-12 NOTE — Progress Notes (Signed)
Patient ID: Suzanne Strong, female   DOB: December 12, 1945, 73 y.o.   MRN: [id number]  Chief Complaint  Patient presents with  . Follow-up    one year bilateral mammogram    HPI Suzanne Strong is a 73 y.o. female who presents for a breast evaluation. The most recent mammogram was done on 04/08/2018 .  Patient does perform regular self breast checks and gets regular mammograms done.    HPI  Past Medical History:  Diagnosis Date  . Anemia    iron deficiency and B12 deficiency  . Anxiety   . Breast cancer ( [place]) 02/25/2015   upper-outer quadrant of left female breast, Triple Negative. Neo-adjuvant chemo with complete pathologic response.   . Cervical dysplasia    conization  . Diabetes mellitus without complication ([place])   . Diverticulosis   . Fever 04/11/2015  . Gastroparesis    related to previous radiation tx  . GERD (gastroesophageal reflux disease)   . Hyperlipidemia   . Hypertension   . Personal history of radiation therapy 2017   LEFT lumpectomy w/ radiation  . Shingles    Hx of  . 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 APPENDECTOMY    . BREAST BIOPSY Left 02/25/2015   INVASIVE MAMMARY CARCINOMA,Triple negative.  [name] BREAST CYST ASPIRATION    . BREAST EXCISIONAL BIOPSY Left 12/2015   surgery  . BREAST LUMPECTOMY Left 2017  . BREAST LUMPECTOMY WITH NEEDLE LOCALIZATION Left 10/02/2015   Procedure: BREAST LUMPECTOMY WITH NEEDLE LOCALIZATION;  Surgeon: [name], MD;  Location: [facility];  Service: General;  Laterality: Left;  . BREAST LUMPECTOMY WITH SENTINEL LYMPH NODE BIOPSY Left 10/02/2015   Procedure: LEFT BREAST WIDE EXCISION WITH SENTINEL LYMPH NODE BX;  Surgeon: [name], MD;  Location: [facility];  Service: General;  Laterality: Left;  . BREAST SURGERY     breast biopsy benign  . CARPAL TUNNEL RELEASE Right 05/19/2017   Procedure: CARPAL TUNNEL RELEASE;  Surgeon: Earnestine Leys, MD;  Location: [facility];   Service: Orthopedics;  Laterality: Right;  . CHOLECYSTECTOMY    . Cologuard  07/22/2016   Negative  . ESOPHAGOGASTRODUODENOSCOPY  07/2009   normal with few gastric polyps  . MOLE REMOVAL    . PORT-A-CATH REMOVAL    . PORTACATH PLACEMENT    . PORTACATH PLACEMENT Right 03/12/2015   Procedure: INSERTION PORT-A-CATH;  Surgeon: [name], MD;  Location: [facility];  Service: General;  Laterality: Right;  . RADICAL NECK DISSECTION    . TONSILLECTOMY     cancer treated with chemo and radiation  . TRIGGER FINGER RELEASE Right 05/19/2017   Procedure: RELEASE TRIGGER FINGER/A-1 PULLEY ring finger;  Surgeon: Earnestine Leys, MD;  Location: [facility];  Service: Orthopedics;  Laterality: Right;    Family History  Problem Relation Age of Onset  . Osteoporosis Mother   . Hyperlipidemia Mother   . Depression Mother   . Cancer Mother        skin cancer ? basal cell and lung ca heavy smoker  . Alcohol abuse Father   . Cancer Father        skin CA ? basal cell  . Heart disease Father        CHF  . Hyperlipidemia Brother   . Hypertension Brother   . Breast cancer Neg Hx     Social History Social History   Tobacco Use  . Smoking status: Former Smoker  Packs/day: 0.50    Years: 6.00    Pack years: 3.00    Types: Cigarettes    Last attempt to quit: 03/10/1984    Years since quitting: 34.1  . Smokeless tobacco: Never Used  Substance Use Topics  . Alcohol use: No    Alcohol/week: 0.0 standard drinks  . Drug use: No    Allergies  Allergen Reactions  . Amoxicillin Swelling    REACTION: rash, swelling Has patient had a PCN reaction causing immediate rash, facial/tongue/throat swelling, SOB or lightheadedness with hypotension: yes Has patient had a PCN reaction causing severe rash involving mucus membranes or skin necrosis: no Has patient had a PCN reaction that required hospitalization: no Has patient had a PCN reaction occurring within the last 10 years: yes If all of the above  answers are "NO", then may proceed with Cephalosporin use.   [name] Fluconazole Rash    REACTION: hives  . Difuleron [Ferrous Fumarate-Dss]   . Other Other (See Comments)    Pt has had tonsil cancer (on Left side) - pt may have difficulty swallowing    Current Outpatient Medications  Medication Sig Dispense Refill  . ALPRAZolam (XANAX) 0.5 MG tablet Take 1 tablet (0.5 mg total) by mouth 2 (two) times daily as needed for anxiety or sleep. 60 tablet 2  . amitriptyline (ELAVIL) 50 MG tablet Take 1 tablet (50 mg total) by mouth at bedtime. 90 tablet 3  . atorvastatin (LIPITOR) 10 MG tablet Take 1 tablet (10 mg total) by mouth daily. 90 tablet 3  . b complex vitamins tablet Take 1 tablet by mouth daily.      . cyanocobalamin (,VITAMIN B-12,) 1000 MCG/ML injection Inject 1,000 mcg into the muscle every 30 (thirty) days.      . fluticasone (FLONASE) 50 MCG/ACT nasal spray instill 2 sprays into each nostril once daily 16 g 11  . gabapentin (NEURONTIN) 300 MG capsule TAKE 2 CAPSULES BY MOUTH THREE TIMES DAILY (Patient taking differently: 900 mg at bedtime. ) 180 capsule 0  . glipiZIDE (GLUCOTROL) 10 MG tablet Take 10 mg by mouth 2 (two) times daily before a meal.     . JANUMET XR 50-1000 MG TB24 Take 1 tablet by mouth 2 (two) times daily.   0  . lidocaine-prilocaine (EMLA) cream Apply 1 application topically as needed. Apply to port when going through Chemo    . omeprazole (PRILOSEC) 20 MG capsule Take 1 capsule (20 mg total) by mouth at bedtime. 90 capsule 3  . rOPINIRole (REQUIP) 1 MG tablet Take 3 tablets (3 mg total) by mouth See admin instructions. 270 tablet 3  . sertraline (ZOLOFT) 100 MG tablet Take 2 tablets (200 mg total) by mouth daily. 60 tablet 2  . traZODone (DESYREL) 50 MG tablet 1-2 hs 60 tablet 2   No current facility-administered medications for this visit.    Facility-Administered Medications Ordered in Other Visits  Medication Dose Route Frequency Provider Last Rate Last Dose  .  sodium chloride flush (NS) 0.9 % injection 10 mL  10 mL Intravenous PRN Cammie Sickle, MD   10 mL at 09/11/15 0845    Review of Systems Review of Systems  Constitutional: Negative.   Respiratory: Negative.   Cardiovascular: Negative.     Blood pressure 124/70, pulse 99, temperature 97.7 F (36.5 C), temperature source Temporal, resp. rate 12, height 5\' 6"  (1.676 m), weight 217 lb (98.4 kg), SpO2 99 %.  Physical Exam Physical Exam Exam conducted with a chaperone  present.  Constitutional:      Appearance: She is well-developed.  Eyes:     General: No scleral icterus.    Conjunctiva/sclera: Conjunctivae normal.  Neck:     Musculoskeletal: Normal range of motion and neck supple.   Cardiovascular:     Rate and Rhythm: Normal rate and regular rhythm.     Heart sounds: Normal heart sounds.  Pulmonary:     Effort: Pulmonary effort is normal.     Breath sounds: Normal breath sounds.  Chest:     Breasts:        Right: No inverted nipple, mass, nipple discharge, skin change or tenderness.        Left: No inverted nipple, mass, nipple discharge, skin change or tenderness.    Lymphadenopathy:     Cervical: No cervical adenopathy.     Upper Body:     Right upper body: No axillary adenopathy.     Left upper body: No supraclavicular or axillary adenopathy.  Skin:    General: Skin is warm and dry.  Neurological:     Mental Status: She is alert and oriented to person, place, and time.     Data Reviewed Bilateral diagnostic mammogram of April 08, 2018 reviewed: BI-RADS-2.  Assessment No evidence of recurrent breast cancer.  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  Patient will return for biopsy.  Plan  Patient to return for supraclavicular skin biopsy   The patient has been asked to return to the office in one year with a bilateral diagnostic mammogram.  HPI, Physical Exam, Assessment and Plan have been scribed under the  direction and in the presence of Hervey Ard, MD.  Gaspar Cola, CMA  HPI, Physical Exam, Assessment and Plan have been scribed under the direction and in the presence of [name], MD. Jonnie Finner, CMA     [name] Theordore Cisnero 04/13/2018, 9:47 AM

## 2018-04-13 ENCOUNTER — Encounter: Payer: Self-pay | Admitting: General Surgery

## 2018-04-14 ENCOUNTER — Ambulatory Visit (INDEPENDENT_AMBULATORY_CARE_PROVIDER_SITE_OTHER): Payer: PPO | Admitting: Family Medicine

## 2018-04-14 ENCOUNTER — Encounter: Payer: Self-pay | Admitting: Family Medicine

## 2018-04-14 ENCOUNTER — Telehealth: Payer: Self-pay

## 2018-04-14 VITALS — BP 118/70 | HR 76 | Temp 97.6°F | Ht 66.0 in | Wt 217.4 lb

## 2018-04-14 DIAGNOSIS — N3000 Acute cystitis without hematuria: Secondary | ICD-10-CM

## 2018-04-14 DIAGNOSIS — N390 Urinary tract infection, site not specified: Secondary | ICD-10-CM | POA: Insufficient documentation

## 2018-04-14 DIAGNOSIS — R300 Dysuria: Secondary | ICD-10-CM | POA: Diagnosis not present

## 2018-04-14 DIAGNOSIS — E119 Type 2 diabetes mellitus without complications: Secondary | ICD-10-CM | POA: Diagnosis not present

## 2018-04-14 LAB — POC URINALSYSI DIPSTICK (AUTOMATED)
Blood, UA: NEGATIVE
PH UA: 5 (ref 5.0–8.0)
PROTEIN UA: NEGATIVE
SPEC GRAV UA: 1.02 (ref 1.010–1.025)

## 2018-04-14 MED ORDER — SULFAMETHOXAZOLE-TRIMETHOPRIM 800-160 MG PO TABS: 1.0000 | ORAL_TABLET | Freq: Two times a day (BID) | ORAL | 0 refills | Status: DC

## 2018-04-14 NOTE — Progress Notes (Signed)
Subjective:    Patient ID: RAMSHA LONIGRO, female    DOB: Mar 05, 1945, 73 y.o.   MRN: [id number]  HPI  Here for urinary symptoms   Started Sunday  She started azo on Monday  Pain in her lower left /pelvic area and bladder (improves a bit after urination)  Frequency and urgency  No burning (on azo) - did at first  No blood  No new flank pain  No n/v No fever  Bladder pressure   Drinks a lot of water due to dry mouth  No incontinence   Results for orders placed or performed in visit on 04/14/18  POCT Urinalysis Dipstick (Automated)  Result Value Ref Range   Color, UA Red/Orange    Clarity, UA Clear    Glucose, UA     Bilirubin, UA     Ketones, UA     Spec Grav, UA 1.020 1.010 - 1.025   Blood, UA Negative    pH, UA 5.0 5.0 - 8.0   Protein, UA Negative Negative   Urobilinogen, UA     Nitrite, UA     Leukocytes, UA        Diabetes Sees Dr Gabriel Carina  In December a1c was 7.1 Eye exam 7/19 On statin and ace  Sugar has been good at home  Trying to eat healthy   Wt Readings from Last 3 Encounters:  04/14/18 217 lb 7 oz (98.6 kg)  04/12/18 217 lb (98.4 kg)  04/11/18 199 lb (90.3 kg)   35.10 kg/m   Working with PT re: her "wobbling" -ataxia  Having balance issues  Saw neurology    Patient Active Problem List   Diagnosis Date Noted  . UTI (urinary tract infection) 04/14/2018  . Pre-syncope 02/07/2018  . Orthostatic hypotension 01/26/2018  . Episodic weakness 01/26/2018  . Medicare annual wellness visit, subsequent 06/29/2017  . Preoperative examination 04/27/2017  . Carpal tunnel syndrome of right wrist 11/13/2016  . Obstructive sleep apnea 01/01/2016  . Carcinoma of upper-outer quadrant of left breast in female, estrogen receptor negative ([place]) 11/21/2015  . Hypomagnesemia 08/28/2015  . Initial Medicare annual wellness visit 10/16/2014  . Estrogen deficiency 10/16/2014  . Colon cancer screening 10/16/2014  . Controlled type 2 diabetes mellitus without  complication ([place]) 95/62/1308  . RLS (restless legs syndrome) 02/21/2014  . Chronic neck pain 07/25/2010  . Depression 07/07/2010  . Routine general medical examination at a health care facility 06/26/2010  . B12 deficiency 09/04/2009  . Anemia, iron deficiency 08/29/2009  . Anxiety state 08/26/2009  . GASTROPARESIS 08/26/2009  . Vitamin D deficiency 07/19/2008  . Disorder of bone and cartilage 07/19/2007  . Hyperlipidemia 07/18/2007  . EDEMA 07/18/2007   Past Medical History:  Diagnosis Date  . Anemia    iron deficiency and B12 deficiency  . Anxiety   . Breast cancer ([place]) 02/25/2015   upper-outer quadrant of left female breast, Triple Negative. Neo-adjuvant chemo with complete pathologic response.   . Cervical dysplasia    conization  . Diabetes mellitus without complication ([place])   . Diverticulosis   . Fever 04/11/2015  . Gastroparesis    related to previous radiation tx  . GERD (gastroesophageal reflux disease)   . Hyperlipidemia   . Hypertension   . Personal history of radiation therapy 2017   LEFT lumpectomy w/ radiation  . Shingles    Hx of  . 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 APPENDECTOMY    .  BREAST BIOPSY Left 02/25/2015   INVASIVE MAMMARY CARCINOMA,Triple negative.  [name] BREAST CYST ASPIRATION    . BREAST EXCISIONAL BIOPSY Left 12/2015   surgery  . BREAST LUMPECTOMY WITH NEEDLE LOCALIZATION Left 10/02/2015   Procedure: BREAST LUMPECTOMY WITH NEEDLE LOCALIZATION;  Surgeon: [name], MD;  Location: [facility];  Service: General;  Laterality: Left;  . BREAST LUMPECTOMY WITH SENTINEL LYMPH NODE BIOPSY Left 10/02/2015   Procedure: LEFT BREAST WIDE EXCISION WITH SENTINEL LYMPH NODE BX;  Surgeon: [name], MD;  Location: [facility];  Service: General;  Laterality: Left;  . BREAST SURGERY     breast biopsy benign  . CARPAL TUNNEL RELEASE Right 05/19/2017   Procedure: CARPAL TUNNEL RELEASE;  Surgeon: Earnestine Leys, MD;  Location: [facility];  Service: Orthopedics;  Laterality: Right;  . CHOLECYSTECTOMY    . Cologuard  07/22/2016   Negative  . ESOPHAGOGASTRODUODENOSCOPY  07/2009   normal with few gastric polyps  . MOLE REMOVAL    . PORT-A-CATH REMOVAL    . PORTACATH PLACEMENT    . PORTACATH PLACEMENT Right 03/12/2015   Procedure: INSERTION PORT-A-CATH;  Surgeon: [name], MD;  Location: [facility];  Service: General;  Laterality: Right;  . RADICAL NECK DISSECTION    . TONSILLECTOMY     cancer treated with chemo and radiation  . TRIGGER FINGER RELEASE Right 05/19/2017   Procedure: RELEASE TRIGGER FINGER/A-1 PULLEY ring finger;  Surgeon: Earnestine Leys, MD;  Location: [facility];  Service: Orthopedics;  Laterality: Right;   Social History   Tobacco Use  . Smoking status: Former Smoker    Packs/day: 0.50    Years: 6.00    Pack years: 3.00    Types: Cigarettes    Last attempt to quit: 03/10/1984    Years since quitting: 34.1  . Smokeless tobacco: Never Used  Substance Use Topics  . Alcohol use: No    Alcohol/week: 0.0 standard drinks  . Drug use: No   Family History  Problem Relation Age of Onset  . Osteoporosis Mother   . Hyperlipidemia Mother   . Depression Mother   . Cancer Mother        skin cancer ? basal cell and lung ca heavy smoker  . Alcohol abuse Father   . Cancer Father        skin CA ? basal cell  . Heart disease Father        CHF  . Hyperlipidemia Brother   . Hypertension Brother   . Breast cancer Neg Hx    Allergies  Allergen Reactions  . Amoxicillin Swelling    REACTION: rash, swelling Has patient had a PCN reaction causing immediate rash, facial/tongue/throat swelling, SOB or lightheadedness with hypotension: yes Has patient had a PCN reaction causing severe rash involving mucus membranes or skin necrosis: no Has patient had a PCN reaction that required hospitalization: no Has patient had a PCN reaction occurring within the last 10 years: yes If all of  the above answers are "NO", then may proceed with Cephalosporin use.   [name] Fluconazole Rash    REACTION: hives  . Difuleron [Ferrous Fumarate-Dss]   . 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 ALPRAZolam (XANAX) 0.5 MG tablet Take 1 tablet (0.5 mg total) by mouth 2 (two) times daily as needed for anxiety or  sleep. 60 tablet 2  . amitriptyline (ELAVIL) 50 MG tablet Take 1 tablet (50 mg total) by mouth at bedtime. 90 tablet 3  . atorvastatin (LIPITOR) 10 MG tablet Take 1 tablet (10 mg total) by mouth daily. 90 tablet 3  . b complex vitamins tablet Take 1 tablet by mouth daily.      . cyanocobalamin (,VITAMIN B-12,) 1000 MCG/ML injection Inject 1,000 mcg into the muscle every 30 (thirty) days.      . fluticasone (FLONASE) 50 MCG/ACT nasal spray instill 2 sprays into each nostril once daily 16 g 11  . gabapentin (NEURONTIN) 300 MG capsule TAKE 2 CAPSULES BY MOUTH THREE TIMES DAILY (Patient taking differently: 900 mg at bedtime. ) 180 capsule 0  . glipiZIDE (GLUCOTROL) 10 MG tablet Take 10 mg by mouth 2 (two) times daily before a meal.     . JANUMET XR 50-1000 MG TB24 Take 1 tablet by mouth 2 (two) times daily.   0  . lidocaine-prilocaine (EMLA) cream Apply 1 application topically as needed. Apply to port when going through Chemo    . omeprazole (PRILOSEC) 20 MG capsule Take 1 capsule (20 mg total) by mouth at bedtime. 90 capsule 3  . rOPINIRole (REQUIP) 1 MG tablet Take 3 tablets (3 mg total) by mouth See admin instructions. 270 tablet 3  . sertraline (ZOLOFT) 100 MG tablet Take 2 tablets (200 mg total) by mouth daily. 60 tablet 2  . traZODone (DESYREL) 50 MG tablet 1-2 hs 60 tablet 2   Current Facility-Administered Medications on File Prior to Visit  Medication Dose Route Frequency Provider Last Rate Last Dose  . sodium chloride flush (NS)  0.9 % injection 10 mL  10 mL Intravenous PRN Cammie Sickle, MD   10 mL at 09/11/15 0845    Review of Systems  Constitutional: Positive for fatigue. Negative for activity change, appetite change and fever.  HENT: Negative for congestion and sore throat.        Chronic dry mouth  Drinks a lot of water   Eyes: Negative for itching and visual disturbance.  Respiratory: Negative for cough and shortness of breath.   Cardiovascular: Negative for leg swelling.  Gastrointestinal: Positive for abdominal pain. Negative for abdominal distention, constipation, diarrhea, nausea and vomiting.       Low abd/pelvic pain middle and left   Endocrine: Negative for cold intolerance and polydipsia.  Genitourinary: Positive for dysuria, frequency, pelvic pain and urgency. Negative for decreased urine volume, difficulty urinating, flank pain, hematuria and vaginal discharge.  Musculoskeletal: Negative for myalgias.  Skin: Negative for rash.  Allergic/Immunologic: Negative for immunocompromised state.  Neurological: Negative for dizziness and weakness.       Balance issues/ataxia slt improved Working with PT  Hematological: Negative for adenopathy.       Objective:   Physical Exam Constitutional:      General: She is not in acute distress.    Appearance: Normal appearance. She is well-developed. She is obese. She is not ill-appearing.  HENT:     Head: Normocephalic and atraumatic.     Mouth/Throat:     Pharynx: Oropharynx is clear.     Comments: Surgical changes to throat  Mouth always appears dry  Eyes:     Conjunctiva/sclera: Conjunctivae normal.     Pupils: Pupils are equal, round, and reactive to light.  Neck:     Musculoskeletal: Normal range of motion and neck supple.  Cardiovascular:     Rate and Rhythm: Normal rate and  regular rhythm.     Heart sounds: Normal heart sounds.  Pulmonary:     Effort: Pulmonary effort is normal. No respiratory distress.     Breath sounds: Normal breath  sounds. No wheezing or rales.  Abdominal:     General: Bowel sounds are normal. There is no distension.     Palpations: Abdomen is soft.     Tenderness: There is abdominal tenderness. There is no rebound.     Comments: No cva tenderness  Mild suprapubic tenderness  Lymphadenopathy:     Cervical: No cervical adenopathy.  Skin:    Findings: No rash.  Neurological:     Mental Status: She is alert. Mental status is at baseline.  Psychiatric:        Mood and Affect: Mood normal.           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

## 2018-04-14 NOTE — Telephone Encounter (Signed)
Patient calls in to request antibiotic for possible urinary tract infection.  I called and spoke with patient to inform her that she should be seen for this to ensure proper treatment.  Patient agreed and appointment made for patient to see Dr. Glori Bickers at [phone] today.   FYI to Dr. Glori Bickers.

## 2018-04-14 NOTE — Assessment & Plan Note (Signed)
Uncomplicated ua affected by azo tx with bactrim ds for 7d (can stop at 5 if better) cx pending-will update If worse or not imp in several days will update  Enc good water intake  Disc prev utis

## 2018-04-14 NOTE — Patient Instructions (Signed)
Continue drinking water  Take the bactrim ds as directed  Update if not starting to improve in several days or if worsening   Azo is ok for a few more days for pain   We will update you when culture returns

## 2018-04-14 NOTE — Assessment & Plan Note (Signed)
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

## 2018-04-15 LAB — URINE CULTURE
MICRO NUMBER:: 251134
SPECIMEN QUALITY:: ADEQUATE

## 2018-04-20 ENCOUNTER — Ambulatory Visit (INDEPENDENT_AMBULATORY_CARE_PROVIDER_SITE_OTHER): Payer: PPO | Admitting: Psychiatry

## 2018-04-20 ENCOUNTER — Encounter: Payer: Self-pay | Admitting: Radiation Oncology

## 2018-04-20 ENCOUNTER — Ambulatory Visit
Admission: RE | Admit: 2018-04-20 | Discharge: 2018-04-20 | Disposition: A | Discharge status: Home / Self Care | Payer: PPO | Source: Ambulatory Visit | Attending: Radiation Oncology | Admitting: Radiation Oncology

## 2018-04-20 ENCOUNTER — Other Ambulatory Visit: Payer: Self-pay

## 2018-04-20 VITALS — BP 158/82 | HR 87 | Temp 95.8°F | Resp 18 | Wt 217.7 lb

## 2018-04-20 DIAGNOSIS — Z171 Estrogen receptor negative status [ER-]: Secondary | ICD-10-CM

## 2018-04-20 DIAGNOSIS — Z923 Personal history of irradiation: Secondary | ICD-10-CM | POA: Insufficient documentation

## 2018-04-20 DIAGNOSIS — F411 Generalized anxiety disorder: Secondary | ICD-10-CM | POA: Diagnosis not present

## 2018-04-20 DIAGNOSIS — G4700 Insomnia, unspecified: Secondary | ICD-10-CM | POA: Diagnosis not present

## 2018-04-20 DIAGNOSIS — Z853 Personal history of malignant neoplasm of breast: Secondary | ICD-10-CM | POA: Insufficient documentation

## 2018-04-20 DIAGNOSIS — C50412 Malignant neoplasm of upper-outer quadrant of left female breast: Secondary | ICD-10-CM

## 2018-04-20 MED ORDER — TRAZODONE HCL 50 MG PO TABS: ORAL_TABLET | ORAL | 2 refills | Status: DC

## 2018-04-20 MED ORDER — ALPRAZOLAM 0.5 MG PO TABS: 0.5000 mg | ORAL_TABLET | Freq: Two times a day (BID) | ORAL | 1 refills | Status: DC | PRN

## 2018-04-20 MED ORDER — SERTRALINE HCL 100 MG PO TABS: ORAL_TABLET | ORAL | 2 refills | Status: DC

## 2018-04-20 NOTE — Progress Notes (Signed)
[facility]  Patient ID: Suzanne Strong,  MRN: [id number]  PCP: [name], MD  Date of Evaluation: 04/20/2018 Time spent:20 minutes  Chief Complaint:   HISTORY/CURRENT STATUS: HPI patient seen 3 months ago.  She was doing fairly well.  She does occasionally take an extra Zoloft during the day if she is having a lot of pain. Currently anxiety is good sleep is better. Individual Medical History/ Review of Systems: Changes? :No   Allergies: Amoxicillin; Fluconazole; Difuleron [ferrous fumarate-dss]; and Other  Current Medications:  Current Outpatient Medications:  .  ALPRAZolam (XANAX) 0.5 MG tablet, Take 1 tablet (0.5 mg total) by mouth 2 (two) times daily as needed for anxiety or sleep., Disp: 60 tablet, Rfl: 2 .  amitriptyline (ELAVIL) 50 MG tablet, Take 1 tablet (50 mg total) by mouth at bedtime., Disp: 90 tablet, Rfl: 3 .  atorvastatin (LIPITOR) 10 MG tablet, Take 1 tablet (10 mg total) by mouth daily., Disp: 90 tablet, Rfl: 3 .  b complex vitamins tablet, Take 1 tablet by mouth daily.  , Disp: , Rfl:  .  cyanocobalamin (,VITAMIN B-12,) 1000 MCG/ML injection, Inject 1,000 mcg into the muscle every 30 (thirty) days.  , Disp: , Rfl:  .  fluticasone (FLONASE) 50 MCG/ACT nasal spray, instill 2 sprays into each nostril once daily, Disp: 16 g, Rfl: 11 .  gabapentin (NEURONTIN) 300 MG capsule, TAKE 2 CAPSULES BY MOUTH THREE TIMES DAILY (Patient taking differently: 900 mg at bedtime. ), Disp: 180 capsule, Rfl: 0 .  glipiZIDE (GLUCOTROL) 10 MG tablet, Take 10 mg by mouth 2 (two) times daily before a meal. , Disp: , Rfl:  .  JANUMET XR 50-1000 MG TB24, Take 1 tablet by mouth 2 (two) times daily. , Disp: , Rfl: 0 .  lidocaine-prilocaine (EMLA) cream, Apply 1 application topically as needed. Apply to port when going through Chemo, Disp: , Rfl:  .  omeprazole (PRILOSEC) 20 MG capsule, Take 1 capsule (20 mg total) by mouth at bedtime., Disp: 90 capsule, Rfl: 3 .  rOPINIRole  (REQUIP) 1 MG tablet, Take 3 tablets (3 mg total) by mouth See admin instructions., Disp: 270 tablet, Rfl: 3 .  sertraline (ZOLOFT) 100 MG tablet, Take 2 tablets (200 mg total) by mouth daily., Disp: 60 tablet, Rfl: 2 .  sulfamethoxazole-trimethoprim (BACTRIM DS,SEPTRA DS) 800-160 MG tablet, Take 1 tablet by mouth 2 (two) times daily., Disp: 14 tablet, Rfl: 0 .  traZODone (DESYREL) 50 MG tablet, 1-2 hs, Disp: 60 tablet, Rfl: 2 No current facility-administered medications for this visit.   Facility-Administered Medications Ordered in Other Visits:  .  sodium chloride flush (NS) 0.9 % injection 10 mL, 10 mL, Intravenous, PRN, Cammie Sickle, MD, 10 mL at 09/11/15 0845 Medication Side Effects: none  Family Medical/ Social History: Changes? No  MENTAL HEALTH EXAM:  There were no vitals taken for this visit.There is no height or weight on file to calculate BMI.  General Appearance: Casual  Eye Contact:  Good  Speech:  Clear and Coherent  Volume:  Normal  Mood:  Euthymic  Affect:  Appropriate  Thought Process:  Linear  Orientation:  Full (Time, Place, and Person)  Thought Content: Logical   Suicidal Thoughts:  No  Homicidal Thoughts:  No  Memory:  WNL  Judgement:  Good  Insight:  Good  Psychomotor Activity:  Normal  Concentration:  Concentration: Good  Recall:  Good  Fund of Knowledge: Good  Language: Good  Assets:  Desire for  Improvement  ADL's:  Intact  Cognition: WNL  Prognosis:  Good    DIAGNOSES: No diagnosis found.  Receiving Psychotherapy: Yes    RECOMMENDATIONS: Patient will continue Zoloft 100 mg 2 a day.  She can occasionally take 1/3 pill.  Also Xanax is 0.5 once a day to twice a day.  She is to return in 3 months   [employer], [place]

## 2018-04-20 NOTE — Progress Notes (Signed)
Radiation Oncology Follow up Note  Name: Suzanne Strong   Date:   04/20/2018 MRN:  [id number] DOB: 1945-06-13    This 73 y.o. 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 She is seen today in routine follow-up is doing well. Specifically denies breast tenderness cough or bone pain..recent mammograms this month were benign with no evidence of disease. She is not on antiestrogen therapy based on the triple negative nature of her disease.  COMPLICATIONS OF TREATMENT: none  FOLLOW UP COMPLIANCE: keeps appointments   PHYSICAL EXAM:  BP (!) 158/82   Pulse 87   Temp (!) 95.8 F (35.4 C)   Resp 18   Wt 217 lb 11.3 oz (98.8 kg)   BMI 35.14 kg/m  Lungs are clear to A&P cardiac examination essentially unremarkable with regular rate and rhythm. No dominant mass or nodularity is noted in either breast in 2 positions examined. Incision is well-healed. No axillary or supraclavicular adenopathy is appreciated. Cosmetic result is excellent.Well-developed well-nourished patient in NAD. HEENT reveals PERLA, EOMI, discs not visualized.  Oral cavity is clear. No oral mucosal lesions are identified. Neck is clear without evidence of cervical or supraclavicular adenopathy. Lungs are clear to A&P. Cardiac examination is essentially unremarkable with regular rate and rhythm without murmur rub or thrill. Abdomen is benign with no organomegaly or masses noted. Motor sensory and DTR levels are equal and symmetric in the upper and lower extremities. Cranial nerves II through XII are grossly intact. Proprioception is intact. No peripheral adenopathy or edema is identified. No motor or sensory levels are noted.  Crude visual fields are within normal range.  RADIOLOGY RESULTS: mammograms reviewed and compatible with the above-stated findings  PLAN: present time she is doing well with no evidence of disease. I am please were overall progress. I have asked to see her back in 1 year for follow-up. Patient is to call sooner with any concerns.  I would like to take this opportunity to thank you for allowing me to participate in the care of your patient.Noreene Filbert, MD

## 2018-04-22 ENCOUNTER — Inpatient Hospital Stay: Payer: PPO | Admitting: Internal Medicine

## 2018-04-22 ENCOUNTER — Other Ambulatory Visit: Payer: Self-pay | Admitting: *Deleted

## 2018-04-22 ENCOUNTER — Encounter: Payer: Self-pay | Admitting: Internal Medicine

## 2018-04-22 ENCOUNTER — Telehealth: Payer: Self-pay

## 2018-04-22 ENCOUNTER — Inpatient Hospital Stay: Payer: PPO | Attending: Internal Medicine

## 2018-04-22 VITALS — BP 108/61 | HR 84 | Temp 97.6°F | Wt 218.0 lb

## 2018-04-22 DIAGNOSIS — Z923 Personal history of irradiation: Secondary | ICD-10-CM | POA: Diagnosis not present

## 2018-04-22 DIAGNOSIS — I10 Essential (primary) hypertension: Secondary | ICD-10-CM | POA: Insufficient documentation

## 2018-04-22 DIAGNOSIS — K219 Gastro-esophageal reflux disease without esophagitis: Secondary | ICD-10-CM | POA: Insufficient documentation

## 2018-04-22 DIAGNOSIS — R200 Anesthesia of skin: Secondary | ICD-10-CM

## 2018-04-22 DIAGNOSIS — R42 Dizziness and giddiness: Secondary | ICD-10-CM | POA: Insufficient documentation

## 2018-04-22 DIAGNOSIS — Z9221 Personal history of antineoplastic chemotherapy: Secondary | ICD-10-CM

## 2018-04-22 DIAGNOSIS — C50412 Malignant neoplasm of upper-outer quadrant of left female breast: Secondary | ICD-10-CM

## 2018-04-22 DIAGNOSIS — Z87891 Personal history of nicotine dependence: Secondary | ICD-10-CM | POA: Insufficient documentation

## 2018-04-22 DIAGNOSIS — Z85818 Personal history of malignant neoplasm of other sites of lip, oral cavity, and pharynx: Secondary | ICD-10-CM

## 2018-04-22 DIAGNOSIS — E785 Hyperlipidemia, unspecified: Secondary | ICD-10-CM | POA: Diagnosis not present

## 2018-04-22 DIAGNOSIS — Z171 Estrogen receptor negative status [ER-]: Secondary | ICD-10-CM

## 2018-04-22 DIAGNOSIS — E119 Type 2 diabetes mellitus without complications: Secondary | ICD-10-CM | POA: Insufficient documentation

## 2018-04-22 DIAGNOSIS — R05 Cough: Secondary | ICD-10-CM | POA: Diagnosis not present

## 2018-04-22 DIAGNOSIS — D508 Other iron deficiency anemias: Secondary | ICD-10-CM

## 2018-04-22 DIAGNOSIS — R5382 Chronic fatigue, unspecified: Secondary | ICD-10-CM | POA: Diagnosis not present

## 2018-04-22 DIAGNOSIS — Z79899 Other long term (current) drug therapy: Secondary | ICD-10-CM | POA: Diagnosis not present

## 2018-04-22 DIAGNOSIS — R202 Paresthesia of skin: Secondary | ICD-10-CM | POA: Insufficient documentation

## 2018-04-22 DIAGNOSIS — Z7984 Long term (current) use of oral hypoglycemic drugs: Secondary | ICD-10-CM | POA: Insufficient documentation

## 2018-04-22 DIAGNOSIS — Z7952 Long term (current) use of systemic steroids: Secondary | ICD-10-CM | POA: Diagnosis not present

## 2018-04-22 LAB — CBC WITH DIFFERENTIAL/PLATELET
Abs Immature Granulocytes: 0.02 10*3/uL (ref 0.00–0.07)
Basophils Absolute: 0 10*3/uL (ref 0.0–0.1)
Basophils Relative: 0 %
Eosinophils Absolute: 0.1 10*3/uL (ref 0.0–0.5)
Eosinophils Relative: 3 %
HCT: 33.4 % — ABNORMAL LOW (ref 36.0–46.0)
HEMOGLOBIN: 10.4 g/dL — AB (ref 12.0–15.0)
Immature Granulocytes: 0 %
LYMPHS PCT: 9 %
Lymphs Abs: 0.5 10*3/uL — ABNORMAL LOW (ref 0.7–4.0)
MCH: 27.1 pg (ref 26.0–34.0)
MCHC: 31.1 g/dL (ref 30.0–36.0)
MCV: 87 fL (ref 80.0–100.0)
MONO ABS: 0.3 10*3/uL (ref 0.1–1.0)
MONOS PCT: 5 %
Neutro Abs: 4.2 10*3/uL (ref 1.7–7.7)
Neutrophils Relative %: 83 %
Platelets: 193 10*3/uL (ref 150–400)
RBC: 3.84 MIL/uL — ABNORMAL LOW (ref 3.87–5.11)
RDW: 14.4 % (ref 11.5–15.5)
WBC: 5.1 10*3/uL (ref 4.0–10.5)
nRBC: 0 % (ref 0.0–0.2)

## 2018-04-22 LAB — COMPREHENSIVE METABOLIC PANEL
ALK PHOS: 90 U/L (ref 38–126)
ALT: 15 U/L (ref 0–44)
AST: 18 U/L (ref 15–41)
Albumin: 3.5 g/dL (ref 3.5–5.0)
Anion gap: 6 (ref 5–15)
BUN: 22 mg/dL (ref 8–23)
CALCIUM: 8.8 mg/dL — AB (ref 8.9–10.3)
CO2: 26 mmol/L (ref 22–32)
CREATININE: 1.01 mg/dL — AB (ref 0.44–1.00)
Chloride: 104 mmol/L (ref 98–111)
GFR calc non Af Amer: 56 mL/min — ABNORMAL LOW (ref >60)
Glucose, Bld: 93 mg/dL (ref 70–99)
Potassium: 4.1 mmol/L (ref 3.5–5.1)
SODIUM: 136 mmol/L (ref 135–145)
Total Bilirubin: 0.3 mg/dL (ref 0.3–1.2)
Total Protein: 7.3 g/dL (ref 6.5–8.1)

## 2018-04-22 MED ORDER — HYDROCOD POLST-CPM POLST ER 10-8 MG/5ML PO SUER: 5.0000 mL | Freq: Two times a day (BID) | ORAL | 0 refills | Status: DC | PRN

## 2018-04-22 MED ORDER — HEPARIN SOD (PORK) LOCK FLUSH 100 UNIT/ML IV SOLN
500.0000 [IU] | Freq: Once | INTRAVENOUS | Status: AC
  Administered 2018-04-22: 500 [IU] via INTRAVENOUS
  Filled 2018-04-22: qty 5

## 2018-04-22 MED ORDER — SODIUM CHLORIDE 0.9% FLUSH
10.0000 mL | Freq: Once | INTRAVENOUS | Status: AC
  Administered 2018-04-22: 10 mL via INTRAVENOUS
  Filled 2018-04-22: qty 10

## 2018-04-22 NOTE — Telephone Encounter (Signed)
Pt notified Rx sent to pharmacy and advised of Dr. Marliss Coots comments and instructions and f/u appt scheduled

## 2018-04-22 NOTE — Telephone Encounter (Signed)
I sent it  Please schedule f/u next week so I can check out her lungs  If worse over weekend -go to [facility] or ED or if fever  Use caution of sedation and habit

## 2018-04-22 NOTE — Progress Notes (Signed)
[name] OFFICE PROGRESS NOTE  Patient Care Team: Tower, [name], MD as PCP - General Byrnett, [name], MD (General Surgery) [name], MD (Inactive) (Oncology)  Cancer Staging No matching staging information was found for the patient.   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 5 CM TUMOR MASS BIOPSIES POSITIVE FOR INVASIVE MAMMARY CARCINOMA TRIPLE NEGATIVE DISEASE(jANUARY, 2017)stage IIIa. ER/PR/her2 Neu-NEGATIVE  # Cytoxan and Adriamycin from March 18, 2015; carbo-taxol x4 cycles  # AUg 18th s/p Lumpec & SLNBx- Complete path CR [ypT0 ypsn0] s/p RT [oct 2017; Dr.Crystal]  3. MUGA scan of the heart shows ejection fraction to be 61% (January, 2017) 4  ------------------------------------------------------------    DIAGNOSIS: BREAST CANCER  STAGE:  III       ;GOALS: curative  CURRENT/MOST RECENT THERAPY: surveillaince.       Carcinoma of upper-outer quadrant of left breast in female, estrogen receptor negative ([place])   11/21/2015 Initial Diagnosis    Carcinoma of upper-outer quadrant of left breast in female, estrogen receptor negative ([place])       INTERVAL HISTORY:  Suzanne Strong 73 y.o.  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  Patient continues to complain of intermittent tingling and numbness in the feet.  No falls.  Dizzy spells improved.  Chronic mild fatigue.  Patient continues to have intermittent dizzy spells patient is currently undergoing physical therapy.  Evaluated ENT.  She also has cough with mild phlegm.  For the last 1 month.  No shortness of breath or hemoptysis.  Review of Systems  Constitutional: Negative for chills,  diaphoresis, fever, malaise/fatigue and weight loss.  HENT: Negative for nosebleeds and sore throat.   Eyes: Negative for double vision.  Respiratory: Positive for cough and sputum production. Negative for hemoptysis, shortness of breath and wheezing.   Cardiovascular: Negative for chest pain, palpitations, orthopnea and leg swelling.  Gastrointestinal: Negative for abdominal pain, blood in stool, constipation, diarrhea, heartburn, melena, nausea and vomiting.  Genitourinary: Negative for dysuria, frequency and urgency.  Musculoskeletal: Negative for back pain and joint pain.  Skin: Negative.  Negative for itching and rash.  Neurological: Positive for dizziness and tingling. Negative for focal weakness, weakness and headaches.  Endo/Heme/Allergies: Does not bruise/bleed easily.  Psychiatric/Behavioral: Negative for depression. The patient is not nervous/anxious and does not have insomnia.       PAST MEDICAL HISTORY :  Past Medical History:  Diagnosis Date  . Anemia    iron deficiency and B12 deficiency  . Anxiety   . Breast cancer ([place]) 02/25/2015   upper-outer quadrant of left female breast, Triple Negative. Neo-adjuvant chemo with complete pathologic response.   . Cervical dysplasia    conization  . Diabetes mellitus without complication ([place])   . Diverticulosis   . Fever 04/11/2015  . Gastroparesis    related to previous radiation tx  . GERD (gastroesophageal reflux disease)   . Hyperlipidemia   . Hypertension   . Personal history of radiation therapy 2017   LEFT lumpectomy w/ radiation  . Shingles    Hx of  . 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 APPENDECTOMY    . BREAST  BIOPSY Left 02/25/2015   INVASIVE MAMMARY CARCINOMA,Triple negative.  [name] BREAST CYST ASPIRATION    . BREAST EXCISIONAL BIOPSY Left 12/2015   surgery  . BREAST LUMPECTOMY WITH NEEDLE LOCALIZATION Left 10/02/2015   Procedure:  BREAST LUMPECTOMY WITH NEEDLE LOCALIZATION;  Surgeon: [name], MD;  Location: [facility] ORS;  Service: General;  Laterality: Left;  . BREAST LUMPECTOMY WITH SENTINEL LYMPH NODE BIOPSY Left 10/02/2015   Procedure: LEFT BREAST WIDE EXCISION WITH SENTINEL LYMPH NODE BX;  Surgeon: [name], MD;  Location: [facility] ORS;  Service: General;  Laterality: Left;  . BREAST SURGERY     breast biopsy benign  . CARPAL TUNNEL RELEASE Right 05/19/2017   Procedure: CARPAL TUNNEL RELEASE;  Surgeon: Earnestine Leys, MD;  Location: [facility] ORS;  Service: Orthopedics;  Laterality: Right;  . CHOLECYSTECTOMY    . Cologuard  07/22/2016   Negative  . ESOPHAGOGASTRODUODENOSCOPY  07/2009   normal with few gastric polyps  . MOLE REMOVAL    . PORT-A-CATH REMOVAL    . PORTACATH PLACEMENT    . PORTACATH PLACEMENT Right 03/12/2015   Procedure: INSERTION PORT-A-CATH;  Surgeon: [name], MD;  Location: [facility] ORS;  Service: General;  Laterality: Right;  . RADICAL NECK DISSECTION    . TONSILLECTOMY     cancer treated with chemo and radiation  . TRIGGER FINGER RELEASE Right 05/19/2017   Procedure: RELEASE TRIGGER FINGER/A-1 PULLEY ring finger;  Surgeon: Earnestine Leys, MD;  Location: [facility] ORS;  Service: Orthopedics;  Laterality: Right;    FAMILY HISTORY :   Family History  Problem Relation Age of Onset  . Osteoporosis Mother   . Hyperlipidemia Mother   . Depression Mother   . Cancer Mother        skin cancer ? basal cell and lung ca heavy smoker  . Alcohol abuse Father   . Cancer Father        skin CA ? basal cell  . Heart disease Father        CHF  . Hyperlipidemia Brother   . Hypertension Brother   . Breast cancer Neg Hx     SOCIAL HISTORY:   Social History   Tobacco Use  . Smoking status: Former Smoker    Packs/day: 0.50    Years: 6.00    Pack years: 3.00    Types: Cigarettes    Last attempt to quit: 03/10/1984    Years since quitting: 34.1  . Smokeless tobacco: Never Used  Substance Use  Topics  . Alcohol use: No    Alcohol/week: 0.0 standard drinks  . Drug use: No    ALLERGIES:  is allergic to amoxicillin; fluconazole; difuleron [ferrous fumarate-dss]; and other.  MEDICATIONS:  Current Outpatient Medications  Medication Sig Dispense Refill  . ALPRAZolam (XANAX) 0.5 MG tablet Take 1 tablet (0.5 mg total) by mouth 2 (two) times daily as needed for anxiety or sleep. 60 tablet 1  . amitriptyline (ELAVIL) 50 MG tablet Take 1 tablet (50 mg total) by mouth at bedtime. 90 tablet 3  . atorvastatin (LIPITOR) 10 MG tablet Take 1 tablet (10 mg total) by mouth daily. 90 tablet 3  . b complex vitamins tablet Take 1 tablet by mouth daily.      . cyanocobalamin (,VITAMIN B-12,) 1000 MCG/ML injection Inject 1,000 mcg into the muscle every 30 (thirty) days.      . fluticasone (FLONASE) 50 MCG/ACT nasal spray instill 2 sprays into each nostril once daily 16 g 11  . gabapentin (NEURONTIN)  300 MG capsule TAKE 2 CAPSULES BY MOUTH THREE TIMES DAILY (Patient taking differently: 900 mg at bedtime. ) 180 capsule 0  . glipiZIDE (GLUCOTROL) 10 MG tablet Take 10 mg by mouth 2 (two) times daily before a meal.     . JANUMET XR 50-1000 MG TB24 Take 1 tablet by mouth 2 (two) times daily.   0  . lidocaine-prilocaine (EMLA) cream Apply 1 application topically as needed. Apply to port when going through Chemo    . omeprazole (PRILOSEC) 20 MG capsule Take 1 capsule (20 mg total) by mouth at bedtime. 90 capsule 3  . rOPINIRole (REQUIP) 1 MG tablet Take 3 tablets (3 mg total) by mouth See admin instructions. 270 tablet 3  . sertraline (ZOLOFT) 100 MG tablet Use 2 a day 60 tablet 2  . traZODone (DESYREL) 50 MG tablet 1-2 hs 60 tablet 2  . albuterol (PROVENTIL HFA;VENTOLIN HFA) 108 (90 Base) MCG/ACT inhaler Inhale 2 puffs into the lungs every 4 (four) hours as needed for wheezing or shortness of breath (tight chest). 1 Inhaler 0  . chlorpheniramine-HYDROcodone (TUSSIONEX PENNKINETIC ER) 10-8 MG/5ML SUER Take 5  mLs by mouth every 12 (twelve) hours as needed for cough. Caution of sedation 50 mL 0  . predniSONE (DELTASONE) 10 MG tablet Take 3 pills once daily by mouth for 3 days, then 2 pills once daily for 3 days, then 1 pill once daily for 3 days and then stop 18 tablet 0   No current facility-administered medications for this visit.    Facility-Administered Medications Ordered in Other Visits  Medication Dose Route Frequency Provider Last Rate Last Dose  . sodium chloride flush (NS) 0.9 % injection 10 mL  10 mL Intravenous PRN Cammie Sickle, MD   10 mL at 09/11/15 0845    PHYSICAL EXAMINATION: ECOG PERFORMANCE STATUS: 1 - Symptomatic but completely ambulatory  BP 108/61 (BP Location: Left Arm, Patient Position: Sitting, Cuff Size: Normal)   Pulse 84   Temp 97.6 F (36.4 C) (Tympanic)   Wt 218 lb (98.9 kg)   BMI 35.19 kg/m   Filed Weights   04/22/18 1055  Weight: 218 lb (98.9 kg)    Physical Exam  Constitutional: She is oriented to person, place, and time and well-developed, well-nourished, and in no distress.  Obese.  She is walking herself.  Alone.  HENT:  Head: Normocephalic and atraumatic.  Mouth/Throat: Oropharynx is clear and moist. No oropharyngeal exudate.  Eyes: Pupils are equal, round, and reactive to light.  Neck: Normal range of motion. Neck supple.  Cardiovascular: Normal rate and regular rhythm.  Pulmonary/Chest: No respiratory distress. She has no wheezes.  Abdominal: Soft. Bowel sounds are normal. She exhibits no distension and no mass. There is no abdominal tenderness. There is no rebound and no guarding.  Musculoskeletal: Normal range of motion.        General: No tenderness or edema.  Neurological: She is alert and oriented to person, place, and time.  Skin: Skin is warm.  Psychiatric: Affect normal.       LABORATORY DATA:  I have reviewed the data as listed    Component Value Date/Time   NA 136 04/22/2018 1030   NA 142 03/01/2014 0846   K 4.1  04/22/2018 1030   K 4.3 03/01/2014 0846   CL 104 04/22/2018 1030   CL 102 03/01/2014 0846   CO2 26 04/22/2018 1030   CO2 28 03/01/2014 0846   GLUCOSE 93 04/22/2018 1030   GLUCOSE 219 (  H) 03/01/2014 0846   BUN 22 04/22/2018 1030   BUN 12 03/01/2014 0846   CREATININE 1.01 (H) 04/22/2018 1030   CREATININE 0.95 03/01/2014 0846   CALCIUM 8.8 (L) 04/22/2018 1030   CALCIUM 8.6 03/01/2014 0846   PROT 7.3 04/22/2018 1030   PROT 7.5 03/01/2014 0846   ALBUMIN 3.5 04/22/2018 1030   ALBUMIN 3.4 03/01/2014 0846   AST 18 04/22/2018 1030   AST 20 03/01/2014 0846   ALT 15 04/22/2018 1030   ALT 28 03/01/2014 0846   ALKPHOS 90 04/22/2018 1030   ALKPHOS 122 (H) 03/01/2014 0846   BILITOT 0.3 04/22/2018 1030   BILITOT 0.2 03/01/2014 0846   GFRNONAA 56 (L) 04/22/2018 1030   GFRNONAA >60 03/01/2014 0846   GFRNONAA >60 08/24/2013 1015   GFRAA >60 04/22/2018 1030   GFRAA >60 03/01/2014 0846   GFRAA >60 08/24/2013 1015    No results found for: SPEP, UPEP  Lab Results  Component Value Date   WBC 5.1 04/22/2018   NEUTROABS 4.2 04/22/2018   HGB 10.4 (L) 04/22/2018   HCT 33.4 (L) 04/22/2018   MCV 87.0 04/22/2018   PLT 193 04/22/2018      Chemistry      Component Value Date/Time   NA 136 04/22/2018 1030   NA 142 03/01/2014 0846   K 4.1 04/22/2018 1030   K 4.3 03/01/2014 0846   CL 104 04/22/2018 1030   CL 102 03/01/2014 0846   CO2 26 04/22/2018 1030   CO2 28 03/01/2014 0846   BUN 22 04/22/2018 1030   BUN 12 03/01/2014 0846   CREATININE 1.01 (H) 04/22/2018 1030   CREATININE 0.95 03/01/2014 0846      Component Value Date/Time   CALCIUM 8.8 (L) 04/22/2018 1030   CALCIUM 8.6 03/01/2014 0846   ALKPHOS 90 04/22/2018 1030   ALKPHOS 122 (H) 03/01/2014 0846   AST 18 04/22/2018 1030   AST 20 03/01/2014 0846   ALT 15 04/22/2018 1030   ALT 28 03/01/2014 0846   BILITOT 0.3 04/22/2018 1030   BILITOT 0.2 03/01/2014 0846       RADIOGRAPHIC STUDIES: I have personally reviewed the  radiological images as listed and agreed with the findings in the report. No results found.   ASSESSMENT & PLAN:  Carcinoma of upper-outer quadrant of left breast in female, estrogen receptor negative ([place]) # Left breast cancer stage III  ER/PR/her 2 Neu NEG. s/p neoadjuvant chemotherapy-currently under surveillance.  Stable.  # No clinical evidence of progression or recurrence.   #Try cough for the last month; recommend Mucinex if not improved then chest x-ray.  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 ]  # Mild anemia secondary to bone marrow suppression from previous chemotherapy. Hb-10-11 stable  # Hypomagnesemia- 1.5; stable  # DISPOSITION: # follow up with MD/labs-cbc/cmp/ca-27-29-Dr.B   No orders of the defined types were placed in this encounter.  All questions were answered. The patient knows to call the clinic with any problems, questions or concerns.      Cammie Sickle, MD 04/28/2018 7:35 PM

## 2018-04-22 NOTE — Assessment & Plan Note (Addendum)
#   Left breast cancer stage III  ER/PR/her 2 Neu NEG. s/p neoadjuvant chemotherapy-currently under surveillance.  Stable.  # No clinical evidence of progression or recurrence.   #Try cough for the last month; recommend Mucinex if not improved then chest x-ray.  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 ]  # Mild anemia secondary to bone marrow suppression from previous chemotherapy. Hb-10-11 stable  # Hypomagnesemia- 1.5; stable  # DISPOSITION: # follow up with MD/labs-cbc/cmp/ca-27-29-Dr.B

## 2018-04-22 NOTE — Telephone Encounter (Signed)
Pt said she was just seen on 04/14/18(UTI) and request tussionex sent to  [facility] [address] at [employer]. Pt started non prod cough on 04/20/18, some chest congestion, no fever. Offered pt appt at [facility] but pt said she could not do that and to see if Dr Glori Bickers would refill the tussionex. tussionex last filled 12/17/17. Please advise. Pt request cb.

## 2018-04-23 LAB — CANCER ANTIGEN 27.29: CA 27.29: 6.7 U/mL (ref 0.0–38.6)

## 2018-04-25 ENCOUNTER — Ambulatory Visit (INDEPENDENT_AMBULATORY_CARE_PROVIDER_SITE_OTHER): Payer: PPO | Admitting: Family Medicine

## 2018-04-25 ENCOUNTER — Encounter: Payer: Self-pay | Admitting: Family Medicine

## 2018-04-25 VITALS — BP 126/64 | HR 83 | Temp 98.1°F | Ht 66.0 in | Wt 218.1 lb

## 2018-04-25 DIAGNOSIS — J209 Acute bronchitis, unspecified: Secondary | ICD-10-CM | POA: Diagnosis not present

## 2018-04-25 MED ORDER — PREDNISONE 10 MG PO TABS: ORAL_TABLET | ORAL | 0 refills | Status: DC

## 2018-04-25 MED ORDER — ALBUTEROL SULFATE HFA 108 (90 BASE) MCG/ACT IN AERS: 2.0000 | INHALATION_SPRAY | RESPIRATORY_TRACT | 0 refills | Status: DC | PRN

## 2018-04-25 NOTE — Progress Notes (Signed)
Subjective:    Patient ID: Suzanne Strong, female    DOB: 07-Jun-1945, 73 y.o.   MRN: [id number]  HPI Here for cough and congestion   Wt Readings from Last 3 Encounters:  04/22/18 218 lb (98.9 kg)  04/20/18 217 lb 11.3 oz (98.8 kg)  04/14/18 217 lb 7 oz (98.6 kg)   35.19 kg/m   Had a little cough for a week -not bad Friday felt cold /chilled  Took her temp 100.7  Took 2 asa  Then cough worsened (non productive)  Saturday temp 100.5  Generally very tired and weak   No nasal symptoms  Ears- ? Pressure  Throat- hurt slt this weekend   Otc: aspirin Cough medicine at night -tussionex we sent in    Patient Active Problem List   Diagnosis Date Noted  . Acute bronchitis 04/25/2018  . UTI (urinary tract infection) 04/14/2018  . Pre-syncope 02/07/2018  . Orthostatic hypotension 01/26/2018  . Episodic weakness 01/26/2018  . Medicare annual wellness visit, subsequent 06/29/2017  . Preoperative examination 04/27/2017  . Carpal tunnel syndrome of right wrist 11/13/2016  . Obstructive sleep apnea 01/01/2016  . Carcinoma of upper-outer quadrant of left breast in female, estrogen receptor negative ([place]) 11/21/2015  . Hypomagnesemia 08/28/2015  . Initial Medicare annual wellness visit 10/16/2014  . Estrogen deficiency 10/16/2014  . Colon cancer screening 10/16/2014  . Controlled type 2 diabetes mellitus without complication ([place]) 93/26/7124  . RLS (restless legs syndrome) 02/21/2014  . Chronic neck pain 07/25/2010  . Depression 07/07/2010  . Routine general medical examination at a health care facility 06/26/2010  . B12 deficiency 09/04/2009  . Anemia, iron deficiency 08/29/2009  . Anxiety state 08/26/2009  . GASTROPARESIS 08/26/2009  . Vitamin D deficiency 07/19/2008  . Disorder of bone and cartilage 07/19/2007  . Hyperlipidemia 07/18/2007  . EDEMA 07/18/2007   Past Medical History:  Diagnosis Date  . Anemia    iron deficiency and B12 deficiency  . Anxiety   .  Breast cancer ([place]) 02/25/2015   upper-outer quadrant of left female breast, Triple Negative. Neo-adjuvant chemo with complete pathologic response.   . Cervical dysplasia    conization  . Diabetes mellitus without complication ([place])   . Diverticulosis   . Fever 04/11/2015  . Gastroparesis    related to previous radiation tx  . GERD (gastroesophageal reflux disease)   . Hyperlipidemia   . Hypertension   . Personal history of radiation therapy 2017   LEFT lumpectomy w/ radiation  . Shingles    Hx of  . 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 APPENDECTOMY    . BREAST BIOPSY Left 02/25/2015   INVASIVE MAMMARY CARCINOMA,Triple negative.  [name] BREAST CYST ASPIRATION    . BREAST EXCISIONAL BIOPSY Left 12/2015   surgery  . BREAST LUMPECTOMY WITH NEEDLE LOCALIZATION Left 10/02/2015   Procedure: BREAST LUMPECTOMY WITH NEEDLE LOCALIZATION;  Surgeon: [name], MD;  Location: [facility];  Service: General;  Laterality: Left;  . BREAST LUMPECTOMY WITH SENTINEL LYMPH NODE BIOPSY Left 10/02/2015   Procedure: LEFT BREAST WIDE EXCISION WITH SENTINEL LYMPH NODE BX;  Surgeon: [name], MD;  Location: [facility];  Service: General;  Laterality: Left;  . BREAST SURGERY     breast biopsy benign  . CARPAL TUNNEL RELEASE Right 05/19/2017   Procedure: CARPAL TUNNEL RELEASE;  Surgeon: Earnestine Leys, MD;  Location: [facility];  Service: Orthopedics;  Laterality: Right;  . CHOLECYSTECTOMY    .  Cologuard  07/22/2016   Negative  . ESOPHAGOGASTRODUODENOSCOPY  07/2009   normal with few gastric polyps  . MOLE REMOVAL    . PORT-A-CATH REMOVAL    . PORTACATH PLACEMENT    . PORTACATH PLACEMENT Right 03/12/2015   Procedure: INSERTION PORT-A-CATH;  Surgeon: [name], MD;  Location: [facility];  Service: General;  Laterality: Right;  . RADICAL NECK DISSECTION    . TONSILLECTOMY     cancer treated with chemo and radiation  . TRIGGER FINGER RELEASE Right  05/19/2017   Procedure: RELEASE TRIGGER FINGER/A-1 PULLEY ring finger;  Surgeon: Earnestine Leys, MD;  Location: [facility];  Service: Orthopedics;  Laterality: Right;   Social History   Tobacco Use  . Smoking status: Former Smoker    Packs/day: 0.50    Years: 6.00    Pack years: 3.00    Types: Cigarettes    Last attempt to quit: 03/10/1984    Years since quitting: 34.1  . Smokeless tobacco: Never Used  Substance Use Topics  . Alcohol use: No    Alcohol/week: 0.0 standard drinks  . Drug use: No   Family History  Problem Relation Age of Onset  . Osteoporosis Mother   . Hyperlipidemia Mother   . Depression Mother   . Cancer Mother        skin cancer ? basal cell and lung ca heavy smoker  . Alcohol abuse Father   . Cancer Father        skin CA ? basal cell  . Heart disease Father        CHF  . Hyperlipidemia Brother   . Hypertension Brother   . Breast cancer Neg Hx    Allergies  Allergen Reactions  . Amoxicillin Swelling    REACTION: rash, swelling Has patient had a PCN reaction causing immediate rash, facial/tongue/throat swelling, SOB or lightheadedness with hypotension: yes Has patient had a PCN reaction causing severe rash involving mucus membranes or skin necrosis: no Has patient had a PCN reaction that required hospitalization: no Has patient had a PCN reaction occurring within the last 10 years: yes If all of the above answers are "NO", then may proceed with Cephalosporin use.   [name] Fluconazole Rash    REACTION: hives  . Difuleron [Ferrous Fumarate-Dss]   . 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 ALPRAZolam (XANAX) 0.5 MG tablet Take 1 tablet (0.5 mg total) by mouth 2 (two) times daily as needed for anxiety or sleep. 60 tablet 1  . amitriptyline (ELAVIL) 50 MG tablet Take 1 tablet (50 mg total) by mouth at  bedtime. 90 tablet 3  . atorvastatin (LIPITOR) 10 MG tablet Take 1 tablet (10 mg total) by mouth daily. 90 tablet 3  . b complex vitamins tablet Take 1 tablet by mouth daily.      . chlorpheniramine-HYDROcodone (TUSSIONEX PENNKINETIC ER) 10-8 MG/5ML SUER Take 5 mLs by mouth every 12 (twelve) hours as needed for cough. Caution of sedation 50 mL 0  . cyanocobalamin (,VITAMIN B-12,) 1000 MCG/ML injection Inject 1,000 mcg into the muscle every 30 (thirty) days.      . fluticasone (FLONASE) 50 MCG/ACT nasal spray instill 2 sprays into each nostril once daily 16 g 11  . gabapentin (NEURONTIN) 300 MG capsule TAKE 2 CAPSULES BY MOUTH THREE TIMES DAILY (Patient taking  differently: 900 mg at bedtime. ) 180 capsule 0  . glipiZIDE (GLUCOTROL) 10 MG tablet Take 10 mg by mouth 2 (two) times daily before a meal.     . JANUMET XR 50-1000 MG TB24 Take 1 tablet by mouth 2 (two) times daily.   0  . lidocaine-prilocaine (EMLA) cream Apply 1 application topically as needed. Apply to port when going through Chemo    . omeprazole (PRILOSEC) 20 MG capsule Take 1 capsule (20 mg total) by mouth at bedtime. 90 capsule 3  . rOPINIRole (REQUIP) 1 MG tablet Take 3 tablets (3 mg total) by mouth See admin instructions. 270 tablet 3  . sertraline (ZOLOFT) 100 MG tablet Use 2 a day 60 tablet 2  . traZODone (DESYREL) 50 MG tablet 1-2 hs 60 tablet 2   Current Facility-Administered Medications on File Prior to Visit  Medication Dose Route Frequency Provider Last Rate Last Dose  . sodium chloride flush (NS) 0.9 % injection 10 mL  10 mL Intravenous PRN Cammie Sickle, MD   10 mL at 09/11/15 0845    Review of Systems  Constitutional: Positive for appetite change, fatigue and fever.       Fever is gone now   HENT: Positive for postnasal drip and sore throat. Negative for congestion, ear pain, rhinorrhea, sinus pressure, sneezing and voice change.   Eyes: Negative for pain and discharge.  Respiratory: Positive for cough and  chest tightness. Negative for shortness of breath, wheezing and stridor.   Cardiovascular: Negative for chest pain.  Gastrointestinal: Negative for diarrhea, nausea and vomiting.  Genitourinary: Negative for frequency, hematuria and urgency.  Musculoskeletal: Negative for arthralgias and myalgias.  Skin: Negative for rash.  Neurological: Negative for dizziness, weakness, light-headedness and headaches.  Psychiatric/Behavioral: Negative for confusion and dysphoric mood.       Objective:   Physical Exam Constitutional:      General: She is not in acute distress.    Appearance: Normal appearance. She is obese. She is not ill-appearing.  HENT:     Head: Normocephalic and atraumatic.     Comments: No sinus tenderness    Right Ear: Tympanic membrane and ear canal normal.     Left Ear: Tympanic membrane and ear canal normal.     Nose: Nose normal.     Mouth/Throat:     Mouth: Mucous membranes are moist.     Pharynx: Oropharynx is clear.     Comments: Stable post op changes noted  Eyes:     General:        Right eye: No discharge.        Left eye: No discharge.     Conjunctiva/sclera: Conjunctivae normal.     Pupils: Pupils are equal, round, and reactive to light.  Cardiovascular:     Rate and Rhythm: Normal rate and regular rhythm.     Heart sounds: Normal heart sounds.  Pulmonary:     Effort: Pulmonary effort is normal. No respiratory distress.     Breath sounds: No stridor. Wheezing and rhonchi present. No rales.     Comments: Wheeze on forced exp No prolonged exp time  Few scattered rhonchi No rales  Musculoskeletal:        General: Deformity present.  Skin:    General: Skin is warm and dry.     Coloration: Skin is not pale.     Findings: No erythema or rash.  Neurological:     Mental Status: She is alert.  Cranial Nerves: No cranial nerve deficit.  Psychiatric:        Mood and Affect: Mood normal.           Assessment & Plan:   Problem List Items  Addressed This Visit      Respiratory   Acute bronchitis - Primary    With mild wheezing  Disc symptomatic care - see instructions on AVS  tussionex- px for when not working or driving Can use guaif-DM otc of choice  Fluids/rest Tylenol for pain or temp  Px prednisone for wheezing (warned glucose will go up) Also albuterol mdi for prn use with inst for use Handouts given Update if not starting to improve in a week or if worsening

## 2018-04-25 NOTE — Assessment & Plan Note (Signed)
With mild wheezing  Disc symptomatic care - see instructions on AVS  tussionex- px for when not working or driving Can use guaif-DM otc of choice  Fluids/rest Tylenol for pain or temp  Px prednisone for wheezing (warned glucose will go up) Also albuterol mdi for prn use with inst for use Handouts given Update if not starting to improve in a week or if worsening

## 2018-04-25 NOTE — Patient Instructions (Addendum)
For bronchitis   Try mucinex DM or robitussin DM for cough and congestion  Tylenol for fever  Rest and fluids Take prednisone as directed to help lung inflammation and wheezing  tussionex for cough when not working or driving   Try the albuterol for wheezing as needed as well   Your blood glucose will go up on prednisone - be aware   Update if not starting to improve in a week or if worsening

## 2018-04-28 ENCOUNTER — Ambulatory Visit: Payer: PPO | Admitting: General Surgery

## 2018-04-28 ENCOUNTER — Other Ambulatory Visit: Payer: Self-pay

## 2018-04-28 ENCOUNTER — Encounter: Payer: Self-pay | Admitting: General Surgery

## 2018-04-28 VITALS — BP 128/70 | HR 90 | Temp 97.5°F | Resp 18 | Ht 66.0 in | Wt 218.8 lb

## 2018-04-28 DIAGNOSIS — L989 Disorder of the skin and subcutaneous tissue, unspecified: Secondary | ICD-10-CM

## 2018-04-28 DIAGNOSIS — C44519 Basal cell carcinoma of skin of other part of trunk: Secondary | ICD-10-CM | POA: Diagnosis not present

## 2018-04-28 NOTE — Progress Notes (Signed)
Patient ID: Suzanne Strong, female   DOB: 1945/05/14, 73 y.o.   MRN: [id number]  Chief Complaint  Patient presents with  . Procedure    Left supraclavicular biopsy    HPI Suzanne Strong is a 73 y.o. female.  Here today for supraclavicular skin lesion excision. HPI  Past Medical History:  Diagnosis Date  . Anemia    iron deficiency and B12 deficiency  . Anxiety   . Breast cancer ([place]) 02/25/2015   upper-outer quadrant of left female breast, Triple Negative. Neo-adjuvant chemo with complete pathologic response.   . Cervical dysplasia    conization  . Diabetes mellitus without complication ([place])   . Diverticulosis   . Fever 04/11/2015  . Gastroparesis    related to previous radiation tx  . GERD (gastroesophageal reflux disease)   . Hyperlipidemia   . Hypertension   . Personal history of radiation therapy 2017   LEFT lumpectomy w/ radiation  . Shingles    Hx of  . 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 APPENDECTOMY    . BREAST BIOPSY Left 02/25/2015   INVASIVE MAMMARY CARCINOMA,Triple negative.  Suzanne Strong BREAST CYST ASPIRATION    . BREAST EXCISIONAL BIOPSY Left 12/2015   surgery  . BREAST LUMPECTOMY WITH NEEDLE LOCALIZATION Left 10/02/2015   Procedure: BREAST LUMPECTOMY WITH NEEDLE LOCALIZATION;  Surgeon: [name], MD;  Location: [facility];  Service: General;  Laterality: Left;  . BREAST LUMPECTOMY WITH SENTINEL LYMPH NODE BIOPSY Left 10/02/2015   Procedure: LEFT BREAST WIDE EXCISION WITH SENTINEL LYMPH NODE BX;  Surgeon: [name], MD;  Location: [facility];  Service: General;  Laterality: Left;  . BREAST SURGERY     breast biopsy benign  . CARPAL TUNNEL RELEASE Right 05/19/2017   Procedure: CARPAL TUNNEL RELEASE;  Surgeon: Earnestine Leys, MD;  Location: [facility];  Service: Orthopedics;  Laterality: Right;  . CHOLECYSTECTOMY    . Cologuard  07/22/2016   Negative  . ESOPHAGOGASTRODUODENOSCOPY  07/2009   normal  with few gastric polyps  . MOLE REMOVAL    . PORT-A-CATH REMOVAL    . PORTACATH PLACEMENT    . PORTACATH PLACEMENT Right 03/12/2015   Procedure: INSERTION PORT-A-CATH;  Surgeon: [name], MD;  Location: [facility];  Service: General;  Laterality: Right;  . RADICAL NECK DISSECTION    . TONSILLECTOMY     cancer treated with chemo and radiation  . TRIGGER FINGER RELEASE Right 05/19/2017   Procedure: RELEASE TRIGGER FINGER/A-1 PULLEY ring finger;  Surgeon: Earnestine Leys, MD;  Location: [facility];  Service: Orthopedics;  Laterality: Right;    Family History  Problem Relation Age of Onset  . Osteoporosis Mother   . Hyperlipidemia Mother   . Depression Mother   . Cancer Mother        skin cancer ? basal cell and lung ca heavy smoker  . Alcohol abuse Father   . Cancer Father        skin CA ? basal cell  . Heart disease Father        CHF  . Hyperlipidemia Brother   . Hypertension Brother   . Breast cancer Neg Hx     Social History Social History   Tobacco Use  . Smoking status: Former Smoker    Packs/day: 0.50    Years: 6.00    Pack years: 3.00    Types: Cigarettes    Last attempt to quit: 03/10/1984  Years since quitting: 34.1  . Smokeless tobacco: Never Used  Substance Use Topics  . Alcohol use: No    Alcohol/week: 0.0 standard drinks  . Drug use: No    Allergies  Allergen Reactions  . Amoxicillin Swelling    REACTION: rash, swelling Has patient had a PCN reaction causing immediate rash, facial/tongue/throat swelling, SOB or lightheadedness with hypotension: yes Has patient had a PCN reaction causing severe rash involving mucus membranes or skin necrosis: no Has patient had a PCN reaction that required hospitalization: no Has patient had a PCN reaction occurring within the last 10 years: yes If all of the above answers are "NO", then may proceed with Cephalosporin use.   Suzanne Strong Fluconazole Rash    REACTION: hives  . Difuleron [Ferrous Fumarate-Dss]   . Other  Other (See Comments)    Pt has had tonsil cancer (on Left side) - pt may have difficulty swallowing    Current Outpatient Medications  Medication Sig Dispense Refill  . albuterol (PROVENTIL HFA;VENTOLIN HFA) 108 (90 Base) MCG/ACT inhaler Inhale 2 puffs into the lungs every 4 (four) hours as needed for wheezing or shortness of breath (tight chest). 1 Inhaler 0  . ALPRAZolam (XANAX) 0.5 MG tablet Take 1 tablet (0.5 mg total) by mouth 2 (two) times daily as needed for anxiety or sleep. 60 tablet 1  . amitriptyline (ELAVIL) 50 MG tablet Take 1 tablet (50 mg total) by mouth at bedtime. 90 tablet 3  . atorvastatin (LIPITOR) 10 MG tablet Take 1 tablet (10 mg total) by mouth daily. 90 tablet 3  . b complex vitamins tablet Take 1 tablet by mouth daily.      . chlorpheniramine-HYDROcodone (TUSSIONEX PENNKINETIC ER) 10-8 MG/5ML SUER Take 5 mLs by mouth every 12 (twelve) hours as needed for cough. Caution of sedation 50 mL 0  . cyanocobalamin (,VITAMIN B-12,) 1000 MCG/ML injection Inject 1,000 mcg into the muscle every 30 (thirty) days.      . fluticasone (FLONASE) 50 MCG/ACT nasal spray instill 2 sprays into each nostril once daily 16 g 11  . gabapentin (NEURONTIN) 300 MG capsule TAKE 2 CAPSULES BY MOUTH THREE TIMES DAILY (Patient taking differently: 900 mg at bedtime. ) 180 capsule 0  . glipiZIDE (GLUCOTROL) 10 MG tablet Take 10 mg by mouth 2 (two) times daily before a meal.     . JANUMET XR 50-1000 MG TB24 Take 1 tablet by mouth 2 (two) times daily.   0  . lidocaine-prilocaine (EMLA) cream Apply 1 application topically as needed. Apply to port when going through Chemo    . omeprazole (PRILOSEC) 20 MG capsule Take 1 capsule (20 mg total) by mouth at bedtime. 90 capsule 3  . predniSONE (DELTASONE) 10 MG tablet Take 3 pills once daily by mouth for 3 days, then 2 pills once daily for 3 days, then 1 pill once daily for 3 days and then stop 18 tablet 0  . rOPINIRole (REQUIP) 1 MG tablet Take 3 tablets (3 mg  total) by mouth See admin instructions. 270 tablet 3  . sertraline (ZOLOFT) 100 MG tablet Use 2 a day 60 tablet 2  . traZODone (DESYREL) 50 MG tablet 1-2 hs 60 tablet 2   No current facility-administered medications for this visit.    Facility-Administered Medications Ordered in Other Visits  Medication Dose Route Frequency Provider Last Rate Last Dose  . sodium chloride flush (NS) 0.9 % injection 10 mL  10 mL Intravenous PRN Cammie Sickle, MD   10  mL at 09/11/15 0845    Review of Systems Review of Systems  Constitutional: Negative.   Respiratory: Negative.   Cardiovascular: Negative.     Blood pressure 128/70, pulse 90, temperature (!) 97.5 F (36.4 C), temperature source Temporal, resp. rate 18, height 5\' 6"  (1.676 m), weight 218 lb 12.8 oz (99.2 kg), SpO2 90 %.  Physical Exam Physical Exam The area of irregular erythematous nonhealing tissue encompasses approximately a 1 cm area in the left supraclavicular fossa.  Data Reviewed Alcohol was applied to the skin followed by 10 cc of 0.5% Xylocaine with 0.25% Marcaine with 1-200,000 notes of epinephrine.  ChloraPrep was applied to the skin.  The lesion was excised through a anterior/posterior orientated elliptical incision.  A suture was placed at the anterior margin.  2 mm visual margins were maintained from the lesion.  The lesion was excised and sent in formalin for routine histology.  The skin defect was closed with a running 5-0 Prolene suture.  Telfa and Tegaderm dressing applied.  Ice pack provided.  The procedure was well-tolerated.  Assessment Irregular skin lesion of the left supraclavicular fossa.  Plan The patient will make use of ice for comfort and Tylenol if needed for soreness.    Suture removal in one week with the staff.    HPI, Physical Exam, Assessment and Plan have been scribed under the direction and in the presence of [name], MD. Jonnie Finner, CMA  I have completed the exam and  reviewed the above documentation for accuracy and completeness.  I agree with the above.  Haematologist has been used and any errors in dictation or transcription are unintentional.  Hervey Ard, M.D., F.A.C.S.  [name] Atalie Oros 04/28/2018, 1:44 PM

## 2018-04-28 NOTE — Patient Instructions (Addendum)
Suture removal in one week 

## 2018-05-03 ENCOUNTER — Other Ambulatory Visit: Payer: Self-pay | Admitting: Internal Medicine

## 2018-05-03 DIAGNOSIS — C50412 Malignant neoplasm of upper-outer quadrant of left female breast: Secondary | ICD-10-CM

## 2018-05-03 DIAGNOSIS — Z171 Estrogen receptor negative status [ER-]: Principal | ICD-10-CM

## 2018-05-04 ENCOUNTER — Ambulatory Visit: Payer: PPO | Admitting: *Deleted

## 2018-05-04 ENCOUNTER — Other Ambulatory Visit: Payer: Self-pay

## 2018-05-04 DIAGNOSIS — L989 Disorder of the skin and subcutaneous tissue, unspecified: Secondary | ICD-10-CM

## 2018-05-04 NOTE — Patient Instructions (Signed)
The patient is aware to call back for any questions or new concerns.  

## 2018-05-04 NOTE — Progress Notes (Signed)
Patient ID: Suzanne Strong, female   DOB: [birth year], 73 y.o.   MRN: [id number]    Patient came in today for a wound check.  The sutures were removed and steri strips applied. The wound is clean, with no signs of infection noted. Follow up pending.

## 2018-05-04 NOTE — Progress Notes (Signed)
Patient has been informed of her pathology results per Dr.Byrnett.

## 2018-05-07 ENCOUNTER — Other Ambulatory Visit: Payer: Self-pay | Admitting: Family Medicine

## 2018-05-10 ENCOUNTER — Encounter: Payer: Self-pay | Admitting: Family Medicine

## 2018-05-10 MED ORDER — ROPINIROLE HCL 1 MG PO TABS: 3.0000 mg | ORAL_TABLET | Freq: Every day | ORAL | 0 refills | Status: DC

## 2018-05-10 NOTE — Telephone Encounter (Signed)
Will route to Dr. Glori Bickers, pt was prescribed med on 07/05/17 #270 (3 month supply) with 3 refills, I called the pharmacy and it seems like pt has been getting med almost 1 month early her fill dates are:  08/01/17 10/07/17 (1 month early) 12/14/17 (1 month early) 03/05/18: last refill  So pharmacy said she is out of refills now, please advise  On Requip Rx

## 2018-05-10 NOTE — Telephone Encounter (Signed)
Please and call pt to see what the deal is  Is she taking more than indicated on the px ?   I don't remember inc her dose but it is possible Thanks

## 2018-05-10 NOTE — Telephone Encounter (Signed)
I sent the patient an email asking her to clarify and sent 30 pills (10 day supply) to her pharmacy

## 2018-05-10 NOTE — Telephone Encounter (Signed)
Called pharmacy and they did verify all of her pick ups the told me that they are pretty sure pt is taking more then prescribed because even when the looked at her whole profile she also got a few tablets on a discount card because it was to early and her insurance wouldn't pay for it. But the did verify the number of tablets she picked up every time and they have the dates but not the exact times  08/01/2017 #270 tablets  10/03/17 #9 tablets it was to early and insurance wouldn't pay so used discount card 10/07/17 #270 tablets 12/11/17 #12 tablets and again used a discount card 12/14/17 #270 tablets (3 days later)  03/05/18 #249 (only got 249 due to insurance not filling whole amount but still early)  I verified this with the pharmacist.

## 2018-05-10 NOTE — Addendum Note (Signed)
Addended by: [name] A on: 05/10/2018 08:38 PM   Modules accepted: Orders

## 2018-05-10 NOTE — Telephone Encounter (Signed)
Before I refill please ask her to call the pharmacy and verify her pick up times /dates as specified in the note to make sure they are correct and to ask if they may have shorted her pills  Then she or the pharmacy can get back to [place]  I just want to get to the bottom of the problem and do not want to continue using this pharmacy if they are making mistakes  Perhaps she will want to change to a different pharmacy ?

## 2018-05-10 NOTE — Telephone Encounter (Signed)
Patient called about her refill. She doesn't understand why it's too soon to refill the medication. Please call patient back at [phone].

## 2018-05-10 NOTE — Telephone Encounter (Signed)
Pt said she hasn't taken more then prescribed she only take 3 tabs daily max. She said she thinks the pharmacy "messed up" her refills because she is out of meds now. Pt doesn't know what to say about the early refills except that she is out of meds now and needs something for her RLS, pt said even if it's not the requip she needs something especially since she is out of meds now

## 2018-05-17 NOTE — Telephone Encounter (Addendum)
Patient left a voicemail stating that she is calling [facility] back to talk with her about her prescription.

## 2018-05-17 NOTE — Telephone Encounter (Signed)
Called pt and no answer and pt's VM box is full, I can see that she didn't read Dr. Marliss Coots comments on mychart she sent her regarding this medication issue

## 2018-05-23 NOTE — Telephone Encounter (Signed)
Patient called and said she needs to speak to [facility] or Dr.Tower about Requip.  Best number to reach her at is [phone].

## 2018-05-24 MED ORDER — ROPINIROLE HCL 1 MG PO TABS: 3.0000 mg | ORAL_TABLET | Freq: Every day | ORAL | 0 refills | Status: DC

## 2018-05-24 NOTE — Telephone Encounter (Signed)
Addressed in phone note.

## 2018-05-24 NOTE — Addendum Note (Signed)
Addended by: [name] A on: 05/24/2018 04:25 PM   Modules accepted: Orders

## 2018-05-24 NOTE — Telephone Encounter (Signed)
Pt is very sure it's pharmacy error. Pt said that they put her on auto refill so she just picked up the Rx when they called and told her it was ready. Pt is only taking 3 tabs daily max but due to this medication error she would like to change pharmacies to [place]. Pt said she is about to be out of meds an only have 2 tablets left. I did advise of of Dr. Hanley Hays message and she said that she has been and will continue to take 3 tabs max daily

## 2018-05-24 NOTE — Addendum Note (Signed)
Addended by: Tammi Sou on: 05/24/2018 03:55 PM   Modules accepted: Orders

## 2018-06-22 ENCOUNTER — Telehealth: Payer: Self-pay

## 2018-06-22 NOTE — Telephone Encounter (Signed)
She needs to be fever free for 3 days without fever lowering medicine, is this the case?

## 2018-06-22 NOTE — Telephone Encounter (Signed)
Done and in [facility]'s box

## 2018-06-22 NOTE — Telephone Encounter (Signed)
Spoken to patient and she stated that fever was on Friday. No fever since then.

## 2018-06-22 NOTE — Telephone Encounter (Signed)
Pt last seen 04/25/18. Pt said her work place is going back to work on 06/24/18 and pt would like to return to work. Pt said she has residual non prod cough and fever once or twice a month but pt thinks it is still come from bronchitis when pt was seen on 04/25/18. Pt wants to know if she can get letter to return to work on 06/24/18 if Dr Glori Bickers thinks OK. Pt usually works 20 -25 hrs per wk.Please advise.

## 2018-06-23 NOTE — Telephone Encounter (Signed)
Spoken to patient's husband and notified him (on [place]) that the work note is ready. Left in the front office.

## 2018-07-01 ENCOUNTER — Other Ambulatory Visit: Payer: Self-pay | Admitting: Family Medicine

## 2018-07-01 NOTE — Telephone Encounter (Signed)
Last filled 05/24/18 #90 tabs with 0 refills, CPE scheduled 07/22/18

## 2018-07-01 NOTE — Telephone Encounter (Signed)
Yes- unless someone else started her back and I don't know , she should not be on them

## 2018-07-01 NOTE — Telephone Encounter (Signed)
Both meds say that Dr. Glori Bickers d/c them at [facility] on 02/07/18, should pt be taking meds or would you like me to declined them, please advise

## 2018-07-08 ENCOUNTER — Other Ambulatory Visit: Payer: Self-pay | Admitting: Family Medicine

## 2018-07-14 ENCOUNTER — Telehealth: Payer: Self-pay | Admitting: Family Medicine

## 2018-07-14 DIAGNOSIS — Z0000 Encounter for general adult medical examination without abnormal findings: Secondary | ICD-10-CM

## 2018-07-14 DIAGNOSIS — E538 Deficiency of other specified B group vitamins: Secondary | ICD-10-CM

## 2018-07-14 DIAGNOSIS — E119 Type 2 diabetes mellitus without complications: Secondary | ICD-10-CM

## 2018-07-14 DIAGNOSIS — D509 Iron deficiency anemia, unspecified: Secondary | ICD-10-CM

## 2018-07-14 DIAGNOSIS — E559 Vitamin D deficiency, unspecified: Secondary | ICD-10-CM

## 2018-07-14 DIAGNOSIS — E7800 Pure hypercholesterolemia, unspecified: Secondary | ICD-10-CM

## 2018-07-14 NOTE — Telephone Encounter (Signed)
-----   Message from Cloyd Stagers, RT sent at 07/07/2018  1:30 PM EDT ----- Regarding: Lab Orders for Friday 5.29.2020 Please place lab orders for Friday 5.29.2020, Doxy[facility]me visit for physical on Friday 6.5.2020  Thank you, Dyke Maes RT(R)

## 2018-07-15 ENCOUNTER — Ambulatory Visit (INDEPENDENT_AMBULATORY_CARE_PROVIDER_SITE_OTHER): Payer: PPO

## 2018-07-15 ENCOUNTER — Other Ambulatory Visit: Payer: Self-pay

## 2018-07-15 ENCOUNTER — Other Ambulatory Visit (INDEPENDENT_AMBULATORY_CARE_PROVIDER_SITE_OTHER): Payer: PPO

## 2018-07-15 ENCOUNTER — Telehealth: Payer: Self-pay

## 2018-07-15 DIAGNOSIS — E7800 Pure hypercholesterolemia, unspecified: Secondary | ICD-10-CM

## 2018-07-15 DIAGNOSIS — E559 Vitamin D deficiency, unspecified: Secondary | ICD-10-CM

## 2018-07-15 DIAGNOSIS — Z0000 Encounter for general adult medical examination without abnormal findings: Secondary | ICD-10-CM

## 2018-07-15 DIAGNOSIS — D509 Iron deficiency anemia, unspecified: Secondary | ICD-10-CM | POA: Diagnosis not present

## 2018-07-15 DIAGNOSIS — E538 Deficiency of other specified B group vitamins: Secondary | ICD-10-CM | POA: Diagnosis not present

## 2018-07-15 LAB — CBC WITH DIFFERENTIAL/PLATELET
Basophils Absolute: 0 10*3/uL (ref 0.0–0.1)
Basophils Relative: 0.6 % (ref 0.0–3.0)
Eosinophils Absolute: 0.2 10*3/uL (ref 0.0–0.7)
Eosinophils Relative: 3.7 % (ref 0.0–5.0)
HCT: 32.6 % — ABNORMAL LOW (ref 36.0–46.0)
Hemoglobin: 11 g/dL — ABNORMAL LOW (ref 12.0–15.0)
Lymphocytes Relative: 22.6 % (ref 12.0–46.0)
Lymphs Abs: 1 10*3/uL (ref 0.7–4.0)
MCHC: 33.8 g/dL (ref 30.0–36.0)
MCV: 86.4 fl (ref 78.0–100.0)
Monocytes Absolute: 0.3 10*3/uL (ref 0.1–1.0)
Monocytes Relative: 7.1 % (ref 3.0–12.0)
Neutro Abs: 2.9 10*3/uL (ref 1.4–7.7)
Neutrophils Relative %: 66 % (ref 43.0–77.0)
Platelets: 206 10*3/uL (ref 150.0–400.0)
RBC: 3.77 Mil/uL — ABNORMAL LOW (ref 3.87–5.11)
RDW: 15.4 % (ref 11.5–15.5)
WBC: 4.4 10*3/uL (ref 4.0–10.5)

## 2018-07-15 LAB — COMPREHENSIVE METABOLIC PANEL
ALT: 14 U/L (ref 0–35)
AST: 17 U/L (ref 0–37)
Albumin: 3.7 g/dL (ref 3.5–5.2)
Alkaline Phosphatase: 74 U/L (ref 39–117)
BUN: 13 mg/dL (ref 6–23)
CO2: 28 mEq/L (ref 19–32)
Calcium: 9.3 mg/dL (ref 8.4–10.5)
Chloride: 105 mEq/L (ref 96–112)
Creatinine, Ser: 0.86 mg/dL (ref 0.40–1.20)
GFR: 65.12 mL/min (ref >60.00)
Glucose, Bld: 130 mg/dL — ABNORMAL HIGH (ref 70–99)
Potassium: 4.3 mEq/L (ref 3.5–5.1)
Sodium: 142 mEq/L (ref 135–145)
Total Bilirubin: 0.3 mg/dL (ref 0.2–1.2)
Total Protein: 6.8 g/dL (ref 6.0–8.3)

## 2018-07-15 LAB — VITAMIN D 25 HYDROXY (VIT D DEFICIENCY, FRACTURES): VITD: 29.74 ng/mL — ABNORMAL LOW (ref 30.00–100.00)

## 2018-07-15 LAB — LIPID PANEL
Cholesterol: 144 mg/dL (ref 0–200)
HDL: 44.4 mg/dL (ref >39.00)
LDL Cholesterol: 64 mg/dL (ref 0–99)
NonHDL: 99.66
Total CHOL/HDL Ratio: 3
Triglycerides: 180 mg/dL — ABNORMAL HIGH (ref 0.0–149.0)
VLDL: 36 mg/dL (ref 0.0–40.0)

## 2018-07-15 LAB — TSH: TSH: 2.5 u[IU]/mL (ref 0.35–4.50)

## 2018-07-15 LAB — FERRITIN: Ferritin: 46.7 ng/mL (ref 10.0–291.0)

## 2018-07-15 LAB — VITAMIN B12: Vitamin B-12: 429 pg/mL (ref 211–911)

## 2018-07-15 MED ORDER — ALBUTEROL SULFATE HFA 108 (90 BASE) MCG/ACT IN AERS: 2.0000 | INHALATION_SPRAY | Freq: Four times a day (QID) | RESPIRATORY_TRACT | 3 refills | Status: DC | PRN

## 2018-07-15 NOTE — Progress Notes (Signed)
PCP notes:   Health maintenance:  Foot exam - postponed until next office appt PPSV23 - booster needed per health maintenance  Abnormal screenings:   Fall risk - hx of multiple falls Fall Risk  07/15/2018 04/28/2018 04/12/2018 04/12/2018 04/11/2018  Falls in the past year? 1 1 0 0 1  Comment 4 falls due to weakness in legs - - - -  Number falls in past yr: 1 - - - 1  Injury with Fall? 0 1 - - 0  Risk for fall due to : History of fall(s);Impaired balance/gait - - - -  Follow up - - - - Falls evaluation completed   Patient concerns:   None  Nurse concerns:  None  Next PCP appt:   07/22/18 @ 0830  I reviewed health advisor's note, was available for consultation, and agree with documentation and plan. [name] MD

## 2018-07-15 NOTE — Patient Instructions (Signed)
Suzanne Strong , Thank you for taking time to come for your Medicare Wellness Visit. I appreciate your ongoing commitment to your health goals. Please review the following plan we discussed and let me know if I can assist you in the future.   These are the goals we discussed: Goals    . Patient Stated     Starting 07/15/18, I will continue to take medications as prescribed.        This is a list of the screening recommended for you and due dates:  Health Maintenance  Topic Date Due  . Complete foot exam   07/15/2019*  . Pneumonia vaccines (2 of 2 - PPSV23) 07/15/2019*  . Flu Shot  01/12/2021*  . Hemoglobin A1C  07/19/2018  . Eye exam for diabetics  09/15/2018  . Cologuard (Stool DNA test)  07/23/2019  . Mammogram  04/08/2020  . Tetanus Vaccine  06/02/2022  . DEXA scan (bone density measurement)  Completed  .  Hepatitis C: One time screening is recommended by Center for Disease Control  (CDC) for  adults born from [birth year] through 1963.   Completed  . Urine Protein Check  Discontinued  *Topic was postponed. The date shown is not the original due date.   Preventive Care for Adults  A healthy lifestyle and preventive care can promote health and wellness. Preventive health guidelines for adults include the following key practices.  . A routine yearly physical is a good way to check with your health care provider about your health and preventive screening. It is a chance to share any concerns and updates on your health and to receive a thorough exam.  . Visit your dentist for a routine exam and preventive care every 6 months. Brush your teeth twice a day and floss once a day. Good oral hygiene prevents tooth decay and gum disease.  . The frequency of eye exams is based on your age, health, family medical history, use  of contact lenses, and other factors. Follow your health care provider's recommendations for frequency of eye exams.  . Eat a healthy diet. Foods like vegetables, fruits,  whole grains, low-fat dairy products, and lean protein foods contain the nutrients you need without too many calories. Decrease your intake of foods high in solid fats, added sugars, and salt. Eat the right amount of calories for you. Get information about a proper diet from your health care provider, if necessary.  . Regular physical exercise is one of the most important things you can do for your health. Most adults should get at least 150 minutes of moderate-intensity exercise (any activity that increases your heart rate and causes you to sweat) each week. In addition, most adults need muscle-strengthening exercises on 2 or more days a week.  Silver Sneakers may be a benefit available to you. To determine eligibility, you may visit the website: www.silversneakers.com or contact program at 336-369-8955 Mon-Fri between 8AM-8PM.   . Maintain a healthy weight. The body mass index (BMI) is a screening tool to identify possible weight problems. It provides an estimate of body fat based on height and weight. Your health care provider can find your BMI and can help you achieve or maintain a healthy weight.   For adults 20 years and older: ? A BMI below 18.5 is considered underweight. ? A BMI of 18.5 to 24.9 is normal. ? A BMI of 25 to 29.9 is considered overweight. ? A BMI of 30 and above is considered obese.   . Maintain  normal blood lipids and cholesterol levels by exercising and minimizing your intake of saturated fat. Eat a balanced diet with plenty of fruit and vegetables. Blood tests for lipids and cholesterol should begin at age 76 and be repeated every 5 years. If your lipid or cholesterol levels are high, you are over 50, or you are at high risk for heart disease, you may need your cholesterol levels checked more frequently. Ongoing high lipid and cholesterol levels should be treated with medicines if diet and exercise are not working.  . If you smoke, find out from your health care provider  how to quit. If you do not use tobacco, please do not start.  . If you choose to drink alcohol, please do not consume more than 2 drinks per day. One drink is considered to be 12 ounces (355 mL) of beer, 5 ounces (148 mL) of wine, or 1.5 ounces (44 mL) of liquor.  . If you are 30-56 years old, ask your health care provider if you should take aspirin to prevent strokes.  . Use sunscreen. Apply sunscreen liberally and repeatedly throughout the day. You should seek shade when your shadow is shorter than you. Protect yourself by wearing long sleeves, pants, a wide-brimmed hat, and sunglasses year round, whenever you are outdoors.  . Once a month, do a whole body skin exam, using a mirror to look at the skin on your back. Tell your health care provider of new moles, moles that have irregular borders, moles that are larger than a pencil eraser, or moles that have changed in shape or color.

## 2018-07-15 NOTE — Progress Notes (Signed)
Subjective:   Suzanne Strong is a 73 y.o. female who presents for Medicare Annual (Subsequent) preventive examination.  Review of Systems:  N/A Cardiac Risk Factors include: advanced age (>32men, >48 women);obesity (BMI >30kg/m2);dyslipidemia;diabetes mellitus     Objective:     Vitals: There were no vitals taken for this visit.  There is no height or weight on file to calculate BMI.  Advanced Directives 07/15/2018 04/22/2018 04/20/2018 10/08/2017 10/08/2017 06/29/2017 05/13/2017  Does Patient Have a Medical Advance Directive? No No No No No Yes No  Type of Advance Directive - - - - - Press photographer -  Does patient want to make changes to medical advance directive? - No - Patient declined No - Patient declined No - Patient declined No - Patient declined - -  Copy of [place] in Chart? - - - - - No - copy requested -  Would patient like information on creating a medical advance directive? No - Patient declined - No - Patient declined No - Patient declined No - Patient declined - No - Patient declined    Tobacco Social History   Tobacco Use  Smoking Status Former Smoker  . Packs/day: 0.50  . Years: 6.00  . Pack years: 3.00  . Types: Cigarettes  . Last attempt to quit: 03/10/1984  . Years since quitting: 34.3  Smokeless Tobacco Never Used     Counseling given: No   Clinical Intake:  Pre-visit preparation completed: Yes  Pain : No/denies pain Pain Score: 0-No pain     Nutritional Status: BMI > 30  Obese Nutritional Risks: None Diabetes: No  How often do you need to have someone help you when you read instructions, pamphlets, or other written materials from your doctor or pharmacy?: 1 - Never What is the last grade level you completed in school?: 12th grade + 2 yrs college  Interpreter Needed?: No  Comments: pt lives with spouse Information entered by :: LPinson, LPN  Past Medical History:  Diagnosis Date  . Anemia    iron  deficiency and B12 deficiency  . Anxiety   . Breast cancer ([facility]) 02/25/2015   upper-outer quadrant of left female breast, Triple Negative. Neo-adjuvant chemo with complete pathologic response.   . Cervical dysplasia    conization  . Diabetes mellitus without complication ([facility])   . Diverticulosis   . Fever 04/11/2015  . Gastroparesis    related to previous radiation tx  . GERD (gastroesophageal reflux disease)   . Hyperlipidemia   . Hypertension   . Personal history of radiation therapy 2017   LEFT lumpectomy w/ radiation  . Shingles    Hx of  . 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 APPENDECTOMY    . BREAST BIOPSY Left 02/25/2015   INVASIVE MAMMARY CARCINOMA,Triple negative.  [name] BREAST CYST ASPIRATION    . BREAST EXCISIONAL BIOPSY Left 12/2015   surgery  . BREAST LUMPECTOMY WITH NEEDLE LOCALIZATION Left 10/02/2015   Procedure: BREAST LUMPECTOMY WITH NEEDLE LOCALIZATION;  Surgeon: [name], MD;  Location: [facility] ORS;  Service: General;  Laterality: Left;  . BREAST LUMPECTOMY WITH SENTINEL LYMPH NODE BIOPSY Left 10/02/2015   Procedure: LEFT BREAST WIDE EXCISION WITH SENTINEL LYMPH NODE BX;  Surgeon: [name], MD;  Location: [facility] ORS;  Service: General;  Laterality: Left;  . BREAST SURGERY     breast biopsy benign  . CARPAL TUNNEL RELEASE Right 05/19/2017  Procedure: CARPAL TUNNEL RELEASE;  Surgeon: Earnestine Leys, MD;  Location: [facility] ORS;  Service: Orthopedics;  Laterality: Right;  . CHOLECYSTECTOMY    . Cologuard  07/22/2016   Negative  . ESOPHAGOGASTRODUODENOSCOPY  07/2009   normal with few gastric polyps  . MOLE REMOVAL    . PORT-A-CATH REMOVAL    . PORTACATH PLACEMENT    . PORTACATH PLACEMENT Right 03/12/2015   Procedure: INSERTION PORT-A-CATH;  Surgeon: [name], MD;  Location: [facility] ORS;  Service: General;  Laterality: Right;  . RADICAL NECK DISSECTION    . TONSILLECTOMY     cancer treated with chemo and  radiation  . TRIGGER FINGER RELEASE Right 05/19/2017   Procedure: RELEASE TRIGGER FINGER/A-1 PULLEY ring finger;  Surgeon: Earnestine Leys, MD;  Location: [facility] ORS;  Service: Orthopedics;  Laterality: Right;   Family History  Problem Relation Age of Onset  . Osteoporosis Mother   . Hyperlipidemia Mother   . Depression Mother   . Cancer Mother        skin cancer ? basal cell and lung ca heavy smoker  . Alcohol abuse Father   . Cancer Father        skin CA ? basal cell  . Heart disease Father        CHF  . Hyperlipidemia Brother   . Hypertension Brother   . Breast cancer Neg Hx    Social History   Socioeconomic History  . Marital status: Married    Spouse name: Not on file  . Number of children: 0  . Years of education: 69  . Highest education level: Not on file  Occupational History  . Occupation: Clinical cytogeneticist  Social Needs  . Financial resource strain: Not on file  . Food insecurity:    Worry: Not on file    Inability: Not on file  . Transportation needs:    Medical: Not on file    Non-medical: Not on file  Tobacco Use  . Smoking status: Former Smoker    Packs/day: 0.50    Years: 6.00    Pack years: 3.00    Types: Cigarettes    Last attempt to quit: 03/10/1984    Years since quitting: 34.3  . Smokeless tobacco: Never Used  Substance and Sexual Activity  . Alcohol use: No    Alcohol/week: 0.0 standard drinks  . Drug use: No  . Sexual activity: Not Currently  Lifestyle  . Physical activity:    Days per week: Not on file    Minutes per session: Not on file  . Stress: Not on file  Relationships  . Social connections:    Talks on phone: Not on file    Gets together: Not on file    Attends religious service: Not on file    Active member of club or organization: Not on file    Attends meetings of clubs or organizations: Not on file    Relationship status: Not on file  Other Topics Concern  . Not on file  Social History Narrative   Lives with husband in a one  story home.  No children.     Works as Radiation protection practitioner for [employer].     Education: college.     Outpatient Encounter Medications as of 07/15/2018  Medication Sig  . albuterol (VENTOLIN HFA) 108 (90 Base) MCG/ACT inhaler Inhale 2 puffs into the lungs every 6 (six) hours as needed for wheezing or shortness of breath (tight chest).  . ALPRAZolam (XANAX) 0.5  MG tablet Take 1 tablet (0.5 mg total) by mouth 2 (two) times daily as needed for anxiety or sleep.  [name] amitriptyline (ELAVIL) 50 MG tablet Take 1 tablet (50 mg total) by mouth at bedtime.  [name] atorvastatin (LIPITOR) 10 MG tablet TAKE 1 TABLET(10 MG) BY MOUTH DAILY  . b complex vitamins tablet Take 1 tablet by mouth daily.    . chlorpheniramine-HYDROcodone (TUSSIONEX PENNKINETIC ER) 10-8 MG/5ML SUER Take 5 mLs by mouth every 12 (twelve) hours as needed for cough. Caution of sedation  . cyanocobalamin (,VITAMIN B-12,) 1000 MCG/ML injection Inject 1,000 mcg into the muscle every 30 (thirty) days.    . fluticasone (FLONASE) 50 MCG/ACT nasal spray instill 2 sprays into each nostril once daily  . gabapentin (NEURONTIN) 300 MG capsule Take 3 capsules (900 mg total) by mouth at bedtime.  [name] glipiZIDE (GLUCOTROL) 10 MG tablet Take 10 mg by mouth 2 (two) times daily before a meal.   . JANUMET XR 50-1000 MG TB24 Take 1 tablet by mouth 2 (two) times daily.   [name] lidocaine-prilocaine (EMLA) cream Apply 1 application topically as needed. Apply to port when going through Chemo  . omeprazole (PRILOSEC) 20 MG capsule Take 1 capsule (20 mg total) by mouth at bedtime.  . predniSONE (DELTASONE) 10 MG tablet Take 3 pills once daily by mouth for 3 days, then 2 pills once daily for 3 days, then 1 pill once daily for 3 days and then stop  . rOPINIRole (REQUIP) 1 MG tablet TAKE 3 TABLETS BY MOUTH AT BEDTIME  . sertraline (ZOLOFT) 100 MG tablet Use 2 a day  . traZODone (DESYREL) 50 MG tablet 1-2 hs  . [DISCONTINUED] albuterol (PROVENTIL HFA;VENTOLIN HFA) 108 (90 Base)  MCG/ACT inhaler Inhale 2 puffs into the lungs every 4 (four) hours as needed for wheezing or shortness of breath (tight chest).   Facility-Administered Encounter Medications as of 07/15/2018  Medication  . sodium chloride flush (NS) 0.9 % injection 10 mL    Activities of Daily Living In your present state of health, do you have any difficulty performing the following activities: 07/15/2018  Hearing? N  Vision? Y  Difficulty concentrating or making decisions? N  Walking or climbing stairs? N  Dressing or bathing? N  Doing errands, shopping? N  Preparing Food and eating ? N  Using the Toilet? N  In the past six months, have you accidently leaked urine? N  Do you have problems with loss of bowel control? N  Managing your Medications? N  Managing your Finances? N  Housekeeping or managing your Housekeeping? N  Some recent data might be hidden    Patient Care Team: Tower, [name], MD as PCP - General Byrnett, [name], MD (General Surgery) [name], MD (Inactive) (Oncology)    Assessment:   This is a routine wellness examination for Missey.  Vision Screening Comments: Vision exam in 2020 with Dr. Gloriann Loan  Exercise Activities and Dietary recommendations Current Exercise Habits: Home exercise routine, Type of exercise: walking, Time (Minutes): 30, Frequency (Times/Week): 2, Weekly Exercise (Minutes/Week): 60, Intensity: Mild, Exercise limited by: None identified  Goals    . Patient Stated     Starting 07/15/18, I will continue to take medications as prescribed.        Fall Risk Fall Risk  07/15/2018 04/28/2018 04/12/2018 04/12/2018 04/11/2018  Falls in the past year? 1 1 0 0 1  Comment 4 falls due to weakness in legs - - - -  Number falls in  past yr: 1 - - - 1  Injury with Fall? 0 1 - - 0  Risk for fall due to : History of fall(s);Impaired balance/gait - - - -  Follow up - - - - Falls evaluation completed   Depression Screen PHQ 2/9 Scores 07/15/2018 06/29/2017 06/25/2016  02/24/2016  PHQ - 2 Score 0 0 0 0  PHQ- 9 Score 0 0 - -     Cognitive Function MMSE - Mini Mental State Exam 07/15/2018 06/29/2017 06/25/2016  Orientation to time 5 5 5   Orientation to Place 5 5 5   Registration 3 3 3   Attention/ Calculation 0 0 0  Recall 3 3 3   Language- name 2 objects 0 0 0  Language- repeat 1 1 1   Language- follow 3 step command 0 3 3  Language- read & follow direction 0 0 0  Write a sentence 0 0 0  Copy design 0 0 0  Total score 17 20 20      PLEASE NOTE: A Mini-Cog screen was completed. Maximum score is 17. A value of 0 denotes this part of Folstein MMSE was not completed or the patient failed this part of the Mini-Cog screening.   Mini-Cog Screening Orientation to Time - Max 5 pts Orientation to Place - Max 5 pts Registration - Max 3 pts Recall - Max 3 pts Language Repeat - Max 1 pts      Immunization History  Administered Date(s) Administered  . Pneumococcal Conjugate-13 10/16/2014  . Pneumococcal Polysaccharide-23 08/31/2012  . Td 06/16/2001  . Tdap 06/01/2012    Screening Tests Health Maintenance  Topic Date Due  . FOOT EXAM  07/15/2019 (Originally 07/06/2018)  . PNA vac Low Risk Adult (2 of 2 - PPSV23) 07/15/2019 (Originally 08/31/2017)  . INFLUENZA VACCINE  01/12/2021 (Originally 09/17/2018)  . HEMOGLOBIN A1C  07/19/2018  . OPHTHALMOLOGY EXAM  09/15/2018  . Fecal DNA (Cologuard)  07/23/2019  . MAMMOGRAM  04/08/2020  . TETANUS/TDAP  06/02/2022  . DEXA SCAN  Completed  . Hepatitis C Screening  Completed  . URINE MICROALBUMIN  Discontinued       Plan:     I have personally reviewed, addressed, and noted the following in the patient's chart:  A. Medical and social history B. Use of alcohol, tobacco or illicit drugs  C. Current medications and supplements D. Functional ability and status E.  Nutritional status F.  Physical activity G. Advance directives H. List of other physicians I.  Hospitalizations, surgeries, and ER visits in  previous 12 months J.  Vitals (unless it is a telemedicine encounter) K. Screenings to include cognitive, depression, hearing, vision (NOTE: hearing and vision screenings not completed in telemedicine encounter) L. Referrals and appointments   In addition, I have reviewed and discussed with patient certain preventive protocols, quality metrics, and best practice recommendations. A written personalized care plan for preventive services and recommendations were provided to patient.  With patient's permission, we connected on 07/15/18 at  8:30 AM EDT by a video enabled telemedicine application. Two patient identifiers were used to ensure the encounter occurred with the correct person.    Patient was in home and writer was in office.   Signed,   Lindell Noe, MHA, BS, LPN Health Coach

## 2018-07-15 NOTE — Telephone Encounter (Signed)
During AWV, patient requested refill for inhaler.   Albuterol  Prescribed 04/25/18 No refills  [address]

## 2018-07-15 NOTE — Telephone Encounter (Signed)
Please refill times 3 

## 2018-07-15 NOTE — Telephone Encounter (Signed)
done

## 2018-07-16 ENCOUNTER — Other Ambulatory Visit: Payer: Self-pay | Admitting: Family Medicine

## 2018-07-21 ENCOUNTER — Ambulatory Visit (INDEPENDENT_AMBULATORY_CARE_PROVIDER_SITE_OTHER): Payer: PPO | Admitting: Psychiatry

## 2018-07-21 ENCOUNTER — Encounter: Payer: Self-pay | Admitting: Psychiatry

## 2018-07-21 ENCOUNTER — Other Ambulatory Visit: Payer: Self-pay

## 2018-07-21 ENCOUNTER — Ambulatory Visit: Payer: PPO | Admitting: Psychiatry

## 2018-07-21 DIAGNOSIS — G4700 Insomnia, unspecified: Secondary | ICD-10-CM

## 2018-07-21 DIAGNOSIS — F411 Generalized anxiety disorder: Secondary | ICD-10-CM | POA: Diagnosis not present

## 2018-07-21 DIAGNOSIS — E119 Type 2 diabetes mellitus without complications: Secondary | ICD-10-CM | POA: Diagnosis not present

## 2018-07-21 MED ORDER — TRAZODONE HCL 100 MG PO TABS: ORAL_TABLET | ORAL | 0 refills | Status: DC

## 2018-07-21 MED ORDER — SERTRALINE HCL 100 MG PO TABS: ORAL_TABLET | ORAL | 1 refills | Status: DC

## 2018-07-21 MED ORDER — ALPRAZOLAM 0.5 MG PO TABS: 0.5000 mg | ORAL_TABLET | Freq: Two times a day (BID) | ORAL | 1 refills | Status: DC | PRN

## 2018-07-21 NOTE — Progress Notes (Signed)
Suzanne Strong [id number] [birth year] 73 y.o.  Subjective:   Patient ID:  Suzanne Strong is a 73 y.o. (DOB 11/07/1945) female.  Chief Complaint:  Chief Complaint  Patient presents with  . Insomnia    HPI Suzanne Strong presents to the office today for follow-up of anixety and insomnia. Denies depressed mood or irritability. She reports that she has occ difficulty sleeping. Reports that she has difficulty falling asleep every night- "once I fall asleep, I'm good." She reports that she will feel tired and "it's almost like I fight sleep." She reports some restless legs and neuropathy that can interfere with her ability to fall asleep. Denies nightmares. Estimates sleeping about 5 hours a night. She reports "I've never required a lot of sleep." She reports some increase in anxiety which she reports may be situational. Notices some restlessness with anxiety. Denies excessive worry. Has h/o panic attacks when she was menopausal and no longer has panic attacks. She reports that her appetite has been "good." Denies low energy or motivation. Reports "I've never been one to just sit." Concentration is adequate. Denies SI.   Married x 50 years and reports that he is very supportive. Has not had any children. Enjoys cross-stitching, reading, and cooking. Works part-time. 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 Enjoys animals.   She reports taking Xanax prn about 3 times a week and occ when she is unable to fall asleep.   Past Psychiatric Medication Trials: Remeron Xanax Gabapentin- Unsure if this is helpful for RLS Ropinorole- Helpful for RLS Zoloft Trazodone  Review of Systems:  Review of Systems  HENT:       Reports dry mouth since having cancer  Musculoskeletal: Negative for gait problem.  Neurological: Negative for tremors.       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 Reports that she has had occ episodes of sudden weakness and has been evaluated for this.    Psychiatric/Behavioral:       Please refer to HPI   H/o Stage IV tonsil cancer. Has just "beat breast cancer."   Medications: I have reviewed the patient's current medications.  Current Outpatient Medications  Medication Sig Dispense Refill  . albuterol (VENTOLIN HFA) 108 (90 Base) MCG/ACT inhaler Inhale 2 puffs into the lungs every 6 (six) hours as needed for wheezing or shortness of breath (tight chest). 1 Inhaler 3  . [START ON 08/14/2018] ALPRAZolam (XANAX) 0.5 MG tablet Take 1 tablet (0.5 mg total) by mouth 2 (two) times daily as needed for anxiety or sleep. 60 tablet 1  . amitriptyline (ELAVIL) 50 MG tablet Take 1 tablet (50 mg total) by mouth at bedtime. 90 tablet 3  . atorvastatin (LIPITOR) 10 MG tablet TAKE 1 TABLET(10 MG) BY MOUTH DAILY 90 tablet 1  . b complex vitamins tablet Take 1 tablet by mouth daily.      . cyanocobalamin (,VITAMIN B-12,) 1000 MCG/ML injection Inject 1,000 mcg into the muscle every 30 (thirty) days.      Lyndle Herrlich SULFATE PO Take by mouth.    . gabapentin (NEURONTIN) 300 MG capsule Take 3 capsules (900 mg total) by mouth at bedtime. 180 capsule 3  . glipiZIDE (GLUCOTROL) 10 MG tablet Take 10 mg by mouth 2 (two) times daily before a meal.     . JANUMET XR 50-1000 MG TB24 Take 1 tablet by mouth 2 (two) times daily.   0  . Multiple Vitamins-Minerals (WOMENS MULTIVITAMIN PO) Take by mouth.    [name]  omeprazole (PRILOSEC) 20 MG capsule TAKE 1 CAPSULE(20 MG) BY MOUTH AT BEDTIME 90 capsule 1  . rOPINIRole (REQUIP) 1 MG tablet TAKE 3 TABLETS BY MOUTH AT BEDTIME 90 tablet 0  . sertraline (ZOLOFT) 100 MG tablet Use 2 a day 180 tablet 1  . fluticasone (FLONASE) 50 MCG/ACT nasal spray instill 2 sprays into each nostril once daily 16 g 11  . lidocaine-prilocaine (EMLA) cream Apply 1 application topically as needed. Apply to port when going through Chemo    . traZODone (DESYREL) 100 MG tablet Take 1/2-1 tablet po QHS prn insomnia 90 tablet 0   No current  facility-administered medications for this visit.    Facility-Administered Medications Ordered in Other Visits  Medication Dose Route Frequency Provider Last Rate Last Dose  . sodium chloride flush (NS) 0.9 % injection 10 mL  10 mL Intravenous PRN Cammie Sickle, MD   10 mL at 09/11/15 0845    Medication Side Effects: None  Allergies:  Allergies  Allergen Reactions  . Amoxicillin Swelling    REACTION: rash, swelling Has patient had a PCN reaction causing immediate rash, facial/tongue/throat swelling, SOB or lightheadedness with hypotension: yes Has patient had a PCN reaction causing severe rash involving mucus membranes or skin necrosis: no Has patient had a PCN reaction that required hospitalization: no Has patient had a PCN reaction occurring within the last 10 years: yes If all of the above answers are "NO", then may proceed with Cephalosporin use.   [name] Fluconazole Rash    REACTION: hives  . Difuleron [Ferrous Fumarate-Dss]   . 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 Anemia    iron deficiency and B12 deficiency  . Anxiety   . Breast cancer ([place]) 02/25/2015   upper-outer quadrant of left female breast, Triple Negative. Neo-adjuvant chemo with complete pathologic response.   . Cervical dysplasia    conization  . Diabetes mellitus without complication ([place])   . Diverticulosis   . Fever 04/11/2015  . Gastroparesis    related to previous radiation tx  . GERD (gastroesophageal reflux disease)   . Hyperlipidemia   . Hypertension   . Personal history of radiation therapy 2017   LEFT lumpectomy w/ radiation  . Shingles    Hx of  . 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[place]) 2006    Family History  Problem Relation Age of Onset  . Osteoporosis Mother   . Hyperlipidemia Mother   . Depression Mother   . Cancer Mother        skin cancer ? basal cell and lung ca heavy smoker  .  Alcohol abuse Father   . Cancer Father        skin CA ? basal cell  . Heart disease Father        CHF  . Hyperlipidemia Brother   . Hypertension Brother   . Breast cancer Neg Hx     Social History   Socioeconomic History  . Marital status: Married    Spouse name: Not on file  . Number of children: 0  . Years of education: 12  . Highest education level: Not on file  Occupational History  . Occupation: Clinical cytogeneticist  Social Needs  . Financial resource strain: Not on file  . Food insecurity:    Worry: Not on file    Inability: Not on file  . Transportation needs:  Medical: Not on file    Non-medical: Not on file  Tobacco Use  . Smoking status: Former Smoker    Packs/day: 0.50    Years: 6.00    Pack years: 3.00    Types: Cigarettes    Last attempt to quit: 03/10/1984    Years since quitting: 34.3  . Smokeless tobacco: Never Used  Substance and Sexual Activity  . Alcohol use: No    Alcohol/week: 0.0 standard drinks  . Drug use: No  . Sexual activity: Not Currently  Lifestyle  . Physical activity:    Days per week: Not on file    Minutes per session: Not on file  . Stress: Not on file  Relationships  . Social connections:    Talks on phone: Not on file    Gets together: Not on file    Attends religious service: Not on file    Active member of club or organization: Not on file    Attends meetings of clubs or organizations: Not on file    Relationship status: Not on file  . Intimate partner violence:    Fear of current or ex partner: Not on file    Emotionally abused: Not on file    Physically abused: Not on file    Forced sexual activity: Not on file  Other Topics Concern  . Not on file  Social History Narrative   Lives with husband in a one story home.  No children.     Works as Radiation protection practitioner for [employer].     Education: college.     Past Medical History, Surgical history, Social history, and Family history were reviewed and updated as appropriate.    Please see review of systems for further details on the patient's review from today.   Objective:   Physical Exam:  There were no vitals taken for this visit.  Physical Exam Constitutional:      General: She is not in acute distress.    Appearance: She is well-developed.  Musculoskeletal:        General: No deformity.  Neurological:     Mental Status: She is alert and oriented to person, place, and time.     Coordination: Coordination normal.  Psychiatric:        Mood and Affect: Mood is not anxious or depressed. Affect is not labile, blunt, angry or inappropriate.        Speech: Speech normal.        Behavior: Behavior normal.        Thought Content: Thought content normal. Thought content does not include homicidal or suicidal ideation. Thought content does not include homicidal or suicidal plan.        Judgment: Judgment normal.     Comments: Insight intact. No auditory or visual hallucinations. No delusions.      Lab Review:     Component Value Date/Time   NA 142 07/15/2018 0927   NA 142 03/01/2014 0846   K 4.3 07/15/2018 0927   K 4.3 03/01/2014 0846   CL 105 07/15/2018 0927   CL 102 03/01/2014 0846   CO2 28 07/15/2018 0927   CO2 28 03/01/2014 0846   GLUCOSE 130 (H) 07/15/2018 0927   GLUCOSE 219 (H) 03/01/2014 0846   BUN 13 07/15/2018 0927   BUN 12 03/01/2014 0846   CREATININE 0.86 07/15/2018 0927   CREATININE 0.95 03/01/2014 0846   CALCIUM 9.3 07/15/2018 0927   CALCIUM 8.6 03/01/2014 0846   PROT 6.8 07/15/2018 1610  PROT 7.5 03/01/2014 0846   ALBUMIN 3.7 07/15/2018 0927   ALBUMIN 3.4 03/01/2014 0846   AST 17 07/15/2018 0927   AST 20 03/01/2014 0846   ALT 14 07/15/2018 0927   ALT 28 03/01/2014 0846   ALKPHOS 74 07/15/2018 0927   ALKPHOS 122 (H) 03/01/2014 0846   BILITOT 0.3 07/15/2018 0927   BILITOT 0.2 03/01/2014 0846   GFRNONAA 56 (L) 04/22/2018 1030   GFRNONAA >60 03/01/2014 0846   GFRNONAA >60 08/24/2013 1015   GFRAA >60 04/22/2018 1030    GFRAA >60 03/01/2014 0846   GFRAA >60 08/24/2013 1015       Component Value Date/Time   WBC 4.4 07/15/2018 0927   RBC 3.77 (L) 07/15/2018 0927   HGB 11.0 (L) 07/15/2018 0927   HGB 11.5 (L) 03/01/2014 0846   HCT 32.6 (L) 07/15/2018 0927   HCT 36.4 03/01/2014 0846   PLT 206.0 07/15/2018 0927   PLT 254 03/01/2014 0846   MCV 86.4 07/15/2018 0927   MCV 81 03/01/2014 0846   MCH 27.1 04/22/2018 1030   MCHC 33.8 07/15/2018 0927   RDW 15.4 07/15/2018 0927   RDW 15.6 (H) 03/01/2014 0846   LYMPHSABS 1.0 07/15/2018 0927   LYMPHSABS 1.0 03/01/2014 0846   MONOABS 0.3 07/15/2018 0927   MONOABS 0.3 03/01/2014 0846   EOSABS 0.2 07/15/2018 0927   EOSABS 0.1 03/01/2014 0846   BASOSABS 0.0 07/15/2018 0927   BASOSABS 0.0 03/01/2014 0846    No results found for: POCLITH, LITHIUM   No results found for: PHENYTOIN, PHENOBARB, VALPROATE, CBMZ   .res Assessment: Plan:   Patient seen for 30 minutes and greater than 50% of visit spent counseling patient regarding treatment of insomnia to include addressing restless legs and bedtime routine. Recommended taking gabapentin 2 hours before bedtime to allow time for it to take effect and prevent restless legs occurring at time of sleep initiation. Discussed taking trazodone 100 mg at bedtime instead of taking 50 mg at bedtime and another 50 mg upon awakening since taking 100 mg at at bedtime may prevent middle of the night awakenings.  Reviewed potential benefits, risks, and side effects of trazodone to include that trazodone is not a controlled substance or associated with risk of dependence and is safe to take on a nightly basis.  Discussed that she may also wish to consider using trazodone 100 mg at bedtime for several consecutive nights to treat insomnia and then may consider taking lower dose if she is able to continue to sleep without difficulty. Continue to take alprazolam 0.5 mg twice daily as needed for anxiety or insomnia. Continue sertraline 200 mg  daily for anxiety. Patient to follow-up in 2 months or sooner if clinically indicated. Patient advised to contact office with any questions, adverse effects, or acute worsening in signs and symptoms.  Insomnia, unspecified type - Plan: traZODone (DESYREL) 100 MG tablet  Anxiety state - Plan: ALPRAZolam (XANAX) 0.5 MG tablet, sertraline (ZOLOFT) 100 MG tablet  Please see After Visit Summary for patient specific instructions.  Future Appointments  Date Time Provider [place]  08/03/2018  9:50 AM Narda Amber K, DO [facility] None  09/20/2018  9:30 AM Thayer Headings, PMHNP [facility] None  10/20/2018 10:30 AM [facility] None  10/20/2018 11:00 AM Cammie Sickle, MD [facility] None  05/03/2019  2:00 PM Chrystal, Eulas Post, MD [facility] None    No orders of the defined types were placed in this encounter.     -------------------------------

## 2018-07-22 ENCOUNTER — Encounter: Payer: Self-pay | Admitting: Family Medicine

## 2018-07-22 ENCOUNTER — Ambulatory Visit (INDEPENDENT_AMBULATORY_CARE_PROVIDER_SITE_OTHER): Payer: PPO | Admitting: Family Medicine

## 2018-07-22 DIAGNOSIS — D509 Iron deficiency anemia, unspecified: Secondary | ICD-10-CM | POA: Diagnosis not present

## 2018-07-22 DIAGNOSIS — E7800 Pure hypercholesterolemia, unspecified: Secondary | ICD-10-CM | POA: Diagnosis not present

## 2018-07-22 DIAGNOSIS — E119 Type 2 diabetes mellitus without complications: Secondary | ICD-10-CM | POA: Diagnosis not present

## 2018-07-22 DIAGNOSIS — E559 Vitamin D deficiency, unspecified: Secondary | ICD-10-CM | POA: Diagnosis not present

## 2018-07-22 DIAGNOSIS — C50412 Malignant neoplasm of upper-outer quadrant of left female breast: Secondary | ICD-10-CM

## 2018-07-22 DIAGNOSIS — M899 Disorder of bone, unspecified: Secondary | ICD-10-CM

## 2018-07-22 DIAGNOSIS — E538 Deficiency of other specified B group vitamins: Secondary | ICD-10-CM | POA: Diagnosis not present

## 2018-07-22 DIAGNOSIS — Z171 Estrogen receptor negative status [ER-]: Secondary | ICD-10-CM | POA: Diagnosis not present

## 2018-07-22 DIAGNOSIS — M949 Disorder of cartilage, unspecified: Secondary | ICD-10-CM

## 2018-07-22 NOTE — Progress Notes (Signed)
Virtual Visit via Video Note  I connected with Suzanne Strong on 07/22/18 at  8:30 AM EDT by a video enabled telemedicine application and verified that I am speaking with the correct person using two identifiers.  Location: Patient: home Provider: office   I discussed the limitations of evaluation and management by telemedicine and the availability of in person appointments. The patient expressed understanding and agreed to proceed.  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   Weight Wt Readings from Last 3 Encounters:  04/28/18 218 lb 12.8 oz (99.2 kg)  04/25/18 218 lb 2 oz (98.9 kg)  04/22/18 218 lb (98.9 kg)  has not checked her weight at home -does not feel it has changed   amw was 5/29 Noted 4 falls this year /aware No other concerns or care gaps  Colon cancer screening- had cologaurd 6//6/18  Negative  Mammogram 2/20  Personal h/o breast cancer  Had oncol f/u in march Self breast exam -no changes   dexa 9/16-normal bmd H/o osteopenia  D level is 29.7  mvi  Not taking additional vit D    Blood pressure BP Readings from Last 3 Encounters:  04/28/18 128/70  04/25/18 126/64  04/22/18 108/61   Had some orthostasis this winter No symptoms of that   Lab Results  Component Value Date   CREATININE 0.86 07/15/2018   BUN 13 07/15/2018   NA 142 07/15/2018   K 4.3 07/15/2018   CL 105 07/15/2018   CO2 28 07/15/2018   Lab Results  Component Value Date   ALT 14  07/15/2018   AST 17 07/15/2018   ALKPHOS 74 07/15/2018   BILITOT 0.3 07/15/2018   Lab Results  Component Value Date   TSH 2.50 07/15/2018    DM2 Sees endocrinology A1C of 6.9 yesterrday  When she checks her bs is 100-130 in am  Had blood work and u micro later microalb 7.3 ratio   Eats diabetic diet some of the time  She takes boost  Does eat sweets- Dr Gabriel Carina   Exercise -is limited-trying to walk some  Afraid due to the weak spells /neuropathy  Likes bike at [employer] - they are closed     Chronic iron def anemia -ever since her last cancer tx Lab Results  Component Value Date   WBC 4.4 07/15/2018   HGB 11.0 (L) 07/15/2018   HCT 32.6 (L) 07/15/2018   MCV 86.4 07/15/2018   PLT 206.0 07/15/2018  hemoglobin is up from 10.4 Lab Results  Component Value Date   FERRITIN 46.7 07/15/2018  taking iron  325 mg bid  Energy level is ok -gets tired in afternoons   Vit B12 def Lab Results  Component Value Date   VITAMINB12 429 07/15/2018   She takes injections for this /also oral Stable  Hyperlipidemia Lab Results  Component Value Date   CHOL 144 07/15/2018   CHOL 156 06/29/2017  CHOL 156 06/25/2016   Lab Results  Component Value Date   HDL 44.40 07/15/2018   HDL 42.70 06/29/2017   HDL 49.60 06/25/2016   Lab Results  Component Value Date   LDLCALC 64 07/15/2018   LDLCALC 70 06/25/2016   LDLCALC 94 09/18/2014   Lab Results  Component Value Date   TRIG 180.0 (H) 07/15/2018   TRIG 205.0 (H) 06/29/2017   TRIG 183.0 (H) 06/25/2016   Lab Results  Component Value Date   CHOLHDL 3 07/15/2018   CHOLHDL 4 06/29/2017   CHOLHDL 3 06/25/2016   Lab Results  Component Value Date   LDLDIRECT 97.0 06/29/2017   LDLDIRECT 116.1 05/25/2012   LDLDIRECT 114.0 06/30/2010   taking atorvastatin - very good control  Diet- less fat  Cooking more at home   Mood is fairly good - sees psychiatry and counselor  Not depressed Just anxious sometimes  Still struggling with  sleep Seeing counselor  Taking zoloft  Xanax Elavil  Trazodone   Takes requip for RLS Her prev pharmacy shorted her pills- she changed to Roff and has had one refill since (on 5/20)   Patient Active Problem List   Diagnosis Date Noted  . Pre-syncope 02/07/2018  . Orthostatic hypotension 01/26/2018  . Episodic weakness 01/26/2018  . Medicare annual wellness visit, subsequent 06/29/2017  . Preoperative examination 04/27/2017  . Carpal tunnel syndrome of right wrist 11/13/2016  . Obstructive sleep apnea 01/01/2016  . Carcinoma of upper-outer quadrant of left breast in female, estrogen receptor negative ([place]) 11/21/2015  . Hypomagnesemia 08/28/2015  . Initial Medicare annual wellness visit 10/16/2014  . Estrogen deficiency 10/16/2014  . Colon cancer screening 10/16/2014  . Controlled type 2 diabetes mellitus without complication ([place]) 48/54/6270  . RLS (restless legs syndrome) 02/21/2014  . Chronic neck pain 07/25/2010  . Depression 07/07/2010  . Routine general medical examination at a health care facility 06/26/2010  . B12 deficiency 09/04/2009  . Anemia, iron deficiency 08/29/2009  . Anxiety state 08/26/2009  . GASTROPARESIS 08/26/2009  . Vitamin D deficiency 07/19/2008  . Disorder of bone and cartilage 07/19/2007  . Hyperlipidemia 07/18/2007  . EDEMA 07/18/2007  . Skin lesion, superficial 06/02/2007   Past Medical History:  Diagnosis Date  . Anemia    iron deficiency and B12 deficiency  . Anxiety   . Breast cancer ([place]) 02/25/2015   upper-outer quadrant of left female breast, Triple Negative. Neo-adjuvant chemo with complete pathologic response.   . Cervical dysplasia    conization  . Diabetes mellitus without complication ([place])   . Diverticulosis   . Fever 04/11/2015  . Gastroparesis    related to previous radiation tx  . GERD (gastroesophageal reflux disease)   . Hyperlipidemia   . Hypertension   . Personal history of radiation therapy 2017   LEFT  lumpectomy w/ radiation  . Shingles    Hx of  . 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 APPENDECTOMY    . BREAST BIOPSY Left 02/25/2015   INVASIVE MAMMARY CARCINOMA,Triple negative.  [name] BREAST CYST ASPIRATION    . BREAST EXCISIONAL BIOPSY Left 12/2015   surgery  . BREAST LUMPECTOMY WITH NEEDLE LOCALIZATION Left 10/02/2015   Procedure: BREAST LUMPECTOMY WITH NEEDLE LOCALIZATION;  Surgeon: [name], MD;  Location: [facility];  Service: General;  Laterality: Left;  . BREAST LUMPECTOMY WITH SENTINEL LYMPH NODE BIOPSY Left 10/02/2015   Procedure: LEFT BREAST WIDE EXCISION WITH SENTINEL LYMPH NODE BX;  Surgeon: [name], MD;  Location: [facility];  Service: General;  Laterality: Left;  . BREAST SURGERY     breast biopsy benign  . CARPAL TUNNEL RELEASE Right 05/19/2017   Procedure: CARPAL TUNNEL RELEASE;  Surgeon: Earnestine Leys, MD;  Location: [facility];  Service: Orthopedics;  Laterality: Right;  . CHOLECYSTECTOMY    . Cologuard  07/22/2016   Negative  . ESOPHAGOGASTRODUODENOSCOPY  07/2009   normal with few gastric polyps  . MOLE REMOVAL    . PORT-A-CATH REMOVAL    . PORTACATH PLACEMENT    . PORTACATH PLACEMENT Right 03/12/2015   Procedure: INSERTION PORT-A-CATH;  Surgeon: [name], MD;  Location: [facility];  Service: General;  Laterality: Right;  . RADICAL NECK DISSECTION    . TONSILLECTOMY     cancer treated with chemo and radiation  . TRIGGER FINGER RELEASE Right 05/19/2017   Procedure: RELEASE TRIGGER FINGER/A-1 PULLEY ring finger;  Surgeon: Earnestine Leys, MD;  Location: [facility];  Service: Orthopedics;  Laterality: Right;   Social History   Tobacco Use  . Smoking status: Former Smoker    Packs/day: 0.50    Years: 6.00    Pack years: 3.00    Types: Cigarettes    Last attempt to quit: 03/10/1984    Years since quitting: 34.3  . Smokeless tobacco: Never Used  Substance Use Topics  . Alcohol use: No    Alcohol/week:  0.0 standard drinks  . Drug use: No   Family History  Problem Relation Age of Onset  . Osteoporosis Mother   . Hyperlipidemia Mother   . Depression Mother   . Cancer Mother        skin cancer ? basal cell and lung ca heavy smoker  . Alcohol abuse Father   . Cancer Father        skin CA ? basal cell  . Heart disease Father        CHF  . Hyperlipidemia Brother   . Hypertension Brother   . Breast cancer Neg Hx    Allergies  Allergen Reactions  . Amoxicillin Swelling    REACTION: rash, swelling Has patient had a PCN reaction causing immediate rash, facial/tongue/throat swelling, SOB or lightheadedness with hypotension: yes Has patient had a PCN reaction causing severe rash involving mucus membranes or skin necrosis: no Has patient had a PCN reaction that required hospitalization: no Has patient had a PCN reaction occurring within the last 10 years: yes If all of the above answers are "NO", then may proceed with Cephalosporin use.   [name] Fluconazole Rash    REACTION: hives  . Difuleron [Ferrous Fumarate-Dss]   . 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 albuterol (VENTOLIN HFA) 108 (90 Base) MCG/ACT inhaler Inhale 2 puffs into the lungs every 6 (six) hours as needed for wheezing or shortness of breath (tight chest). 1 Inhaler 3  . [START ON 08/14/2018] ALPRAZolam (XANAX) 0.5 MG tablet Take 1 tablet (0.5 mg total) by mouth 2 (two) times daily as needed for anxiety or sleep. 60 tablet 1  . amitriptyline (ELAVIL) 50 MG tablet Take 1 tablet (50 mg total) by mouth at bedtime. 90 tablet 3  . atorvastatin (LIPITOR) 10 MG tablet TAKE 1 TABLET(10 MG) BY MOUTH DAILY 90 tablet 1  . b complex vitamins tablet Take 1  tablet by mouth daily.      . cyanocobalamin (,VITAMIN B-12,) 1000 MCG/ML injection Inject 1,000 mcg into the muscle every 30  (thirty) days.      Lyndle Herrlich SULFATE PO Take by mouth.    . fluticasone (FLONASE) 50 MCG/ACT nasal spray instill 2 sprays into each nostril once daily 16 g 11  . gabapentin (NEURONTIN) 300 MG capsule Take 3 capsules (900 mg total) by mouth at bedtime. 180 capsule 3  . glipiZIDE (GLUCOTROL) 10 MG tablet Take 10 mg by mouth 2 (two) times daily before a meal.     . JANUMET XR 50-1000 MG TB24 Take 1 tablet by mouth 2 (two) times daily.   0  . lidocaine-prilocaine (EMLA) cream Apply 1 application topically as needed. Apply to port when going through Chemo    . Multiple Vitamins-Minerals (WOMENS MULTIVITAMIN PO) Take by mouth.    [name] omeprazole (PRILOSEC) 20 MG capsule TAKE 1 CAPSULE(20 MG) BY MOUTH AT BEDTIME 90 capsule 1  . rOPINIRole (REQUIP) 1 MG tablet TAKE 3 TABLETS BY MOUTH AT BEDTIME 90 tablet 0  . sertraline (ZOLOFT) 100 MG tablet Use 2 a day 180 tablet 1  . traZODone (DESYREL) 100 MG tablet Take 1/2-1 tablet po QHS prn insomnia 90 tablet 0   Current Facility-Administered Medications on File Prior to Visit  Medication Dose Route Frequency Provider Last Rate Last Dose  . sodium chloride flush (NS) 0.9 % injection 10 mL  10 mL Intravenous PRN Cammie Sickle, MD   10 mL at 09/11/15 0845    Observations/Objective: Patient appears well, in no distress Weight is baseline -obese No facial swelling or asymmetry (baseline post surgical changes from tonsil cancer are noted w/o change) Normal voice-not hoarse and no slurred speech No obvious tremor or mobility impairment Moving neck and UEs normally Able to hear the call well  No cough or shortness of breath during interview  Talkative and mentally sharp with no cognitive changes No skin changes on face or neck , no rash or pallor Affect is normal    Assessment and Plan: Please take 4000 iu of vitamin D daily  There is likely not enough in your multi vitamin  Exercise as tolerated Please talk to neurology about the leg weakness, it  is concerning  If shin pain is unrelated and persists let us know  Keep working on diet and exercise   Follow Up Instructions: Please take 4000 iu of vitamin D daily  There is likely not enough in your multi vitamin  Exercise as tolerated Please talk to neurology about the leg weakness, it is concerning  If shin pain is unrelated and persists let us know  Keep working on diet and exercise    I discussed the assessment and treatment plan with the patient. The patient was provided an opportunity to ask questions and all were answered. The patient agreed with the plan and demonstrated an understanding of the instructions.   The patient was advised to call back or seek an in-person evaluation if the symptoms worsen or if the condition fails to improve as anticipated.     [name], MD

## 2018-07-22 NOTE — Patient Instructions (Addendum)
Please take 4000 iu of vitamin D daily  There is likely not enough in your multi vitamin  Exercise as tolerated Please talk to neurology about the leg weakness, it is concerning  If shin pain is unrelated and persists let us know  Keep working on diet and exercise

## 2018-07-23 ENCOUNTER — Encounter: Payer: Self-pay | Admitting: Psychiatry

## 2018-07-24 ENCOUNTER — Encounter: Payer: Self-pay | Admitting: Family Medicine

## 2018-07-24 NOTE — Assessment & Plan Note (Signed)
Last A1C was 6.9 with endocrinology Doing better with that

## 2018-07-24 NOTE — Assessment & Plan Note (Signed)
Stable/improved with regular oral iron  Helping energy as well

## 2018-07-24 NOTE — Assessment & Plan Note (Signed)
Next f/u for the fall with oncology Continues to do well  Neuropathy and iron def anemia from past tx are both stable

## 2018-07-24 NOTE — Assessment & Plan Note (Signed)
Lab Results  Component Value Date   VITAMINB12 429 07/15/2018   Oral and inj therapy

## 2018-07-24 NOTE — Assessment & Plan Note (Signed)
Level is still low in the 20s  Not taking more than a mvi Asked her to add 4000 iu daily to that - disc in detail importance to bone and overall health

## 2018-07-24 NOTE — Assessment & Plan Note (Signed)
H/o osteopenia but last dexa in 2016 was in the normal range

## 2018-07-24 NOTE — Assessment & Plan Note (Signed)
Disc goals for lipids and reasons to control them Rev last labs with pt Rev low sat fat diet in detail Good control with atorvastatin

## 2018-07-26 DIAGNOSIS — G620 Drug-induced polyneuropathy: Secondary | ICD-10-CM | POA: Diagnosis not present

## 2018-07-26 DIAGNOSIS — E119 Type 2 diabetes mellitus without complications: Secondary | ICD-10-CM | POA: Diagnosis not present

## 2018-07-27 ENCOUNTER — Other Ambulatory Visit: Payer: Self-pay | Admitting: Family Medicine

## 2018-07-28 NOTE — Telephone Encounter (Signed)
CPE on 07/22/18, last filled on 07/01/18 #90 tabs with 0 refills

## 2018-08-03 ENCOUNTER — Telehealth (INDEPENDENT_AMBULATORY_CARE_PROVIDER_SITE_OTHER): Payer: PPO | Admitting: Neurology

## 2018-08-03 ENCOUNTER — Other Ambulatory Visit: Payer: Self-pay

## 2018-08-03 ENCOUNTER — Telehealth: Payer: Self-pay | Admitting: Radiology

## 2018-08-03 ENCOUNTER — Encounter: Payer: Self-pay | Admitting: Neurology

## 2018-08-03 VITALS — Ht 66.5 in | Wt 212.0 lb

## 2018-08-03 DIAGNOSIS — I951 Orthostatic hypotension: Secondary | ICD-10-CM

## 2018-08-03 DIAGNOSIS — R6889 Other general symptoms and signs: Secondary | ICD-10-CM | POA: Diagnosis not present

## 2018-08-03 DIAGNOSIS — R202 Paresthesia of skin: Secondary | ICD-10-CM

## 2018-08-03 DIAGNOSIS — R42 Dizziness and giddiness: Secondary | ICD-10-CM

## 2018-08-03 DIAGNOSIS — G5601 Carpal tunnel syndrome, right upper limb: Secondary | ICD-10-CM

## 2018-08-03 DIAGNOSIS — G620 Drug-induced polyneuropathy: Secondary | ICD-10-CM | POA: Diagnosis not present

## 2018-08-03 DIAGNOSIS — T451X5A Adverse effect of antineoplastic and immunosuppressive drugs, initial encounter: Secondary | ICD-10-CM

## 2018-08-03 DIAGNOSIS — M792 Neuralgia and neuritis, unspecified: Secondary | ICD-10-CM

## 2018-08-03 NOTE — Progress Notes (Signed)
   Virtual Visit via Video Note The purpose of this virtual visit is to provide medical care while limiting exposure to the novel coronavirus.    Consent was obtained for video visit:  Yes.   Answered questions that patient had about telehealth interaction:  Yes.   I discussed the limitations, risks, security and privacy concerns of performing an evaluation and management service by telemedicine. I also discussed with the patient that there may be a patient responsible charge related to this service. The patient expressed understanding and agreed to proceed.  Pt location: Home Physician Location: office Name of referring provider:  Tower, [name], MD I connected with Suzanne Strong at patients initiation/request on 08/03/2018 at  9:50 AM EDT by video enabled telemedicine application and verified that I am speaking with the correct person using two identifiers. Pt MRN:  [id number] Pt DOB:  1945/09/21 Video Participants:  Suzanne Strong   History of Present Illness: This is a 73 y.o. 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  Her neuropathy is well controlled on Lyrica 150 mg 3 times daily, which was switched from gabapentin.  She has fallen about 3 times since her last visit, some of which has been contributed by right leg buckling and hip pain.  She also reports right cough achy pain and soreness.  She has not reported this to her primary care doctor.  She continues to have episodic spells of lightheadedness with associated difficulty with focusing both with her vision and thought process.  There is no loss of consciousness or abnormal movements.  Observations/Objective:   Vitals:   08/03/18 0821  Weight: 212 lb (96.2 kg)  Height: 5' 6.5" (1.689 m)   Patient is awake, alert, and appears comfortable.  Oriented x 4.   Extraocular muscles are intact. No ptosis.  Face is symmetric.  Speech is not dysarthric. Antigravity in all extremities.      Assessment and Plan:  1.  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  2.  Left hand paresthesias, suggestive of carpal tunnel syndrome.  She will return to the office for NCS/EMG of the left upper extremity  3.  Spells of lightheadedness, inability to focus, and reduced concentration.  MRI brain and ultrasound carotids is normal.  I will check routine EEG and cardiac event monitor.  4.  Right hip pain, follow-up with PCP    Follow Up Instructions:   I discussed the assessment and treatment plan with the patient. The patient was provided an opportunity to ask questions and all were answered. The patient agreed with the plan and demonstrated an understanding of the instructions.   The patient was advised to call back or seek an in-person evaluation if the symptoms worsen or if the condition fails to improve as anticipated.  Further recommendations pending results.  Total time spent:  25 minutes     Alda Berthold, DO

## 2018-08-03 NOTE — Telephone Encounter (Signed)
Enolled patient for a 30 Day Preventcie Event monitor to be mailed. Brief instructions were gone over with the patient and she knows to expect the monitor to arrive in 3-4 days

## 2018-08-14 ENCOUNTER — Encounter (INDEPENDENT_AMBULATORY_CARE_PROVIDER_SITE_OTHER): Payer: PPO

## 2018-08-14 DIAGNOSIS — R42 Dizziness and giddiness: Secondary | ICD-10-CM

## 2018-08-14 DIAGNOSIS — I951 Orthostatic hypotension: Secondary | ICD-10-CM | POA: Diagnosis not present

## 2018-08-15 ENCOUNTER — Ambulatory Visit: Payer: PPO | Admitting: Neurology

## 2018-08-15 DIAGNOSIS — H2513 Age-related nuclear cataract, bilateral: Secondary | ICD-10-CM | POA: Diagnosis not present

## 2018-08-15 LAB — HM DIABETES EYE EXAM

## 2018-08-22 ENCOUNTER — Other Ambulatory Visit: Payer: Self-pay | Admitting: Family Medicine

## 2018-08-23 NOTE — Telephone Encounter (Signed)
I little early for refill  Please send back to me at the end of the week to refill  Let pt know  Thanks

## 2018-08-23 NOTE — Telephone Encounter (Signed)
Last filled 07/28/2018, last OV 07/22/2018 f/u--please advise

## 2018-08-29 ENCOUNTER — Other Ambulatory Visit: Payer: Self-pay | Admitting: Family Medicine

## 2018-08-29 DIAGNOSIS — H2511 Age-related nuclear cataract, right eye: Secondary | ICD-10-CM | POA: Diagnosis not present

## 2018-08-30 NOTE — Telephone Encounter (Signed)
Called pt back it was a pharmacy error, last month they rang up her Rx as 1 tabs daily  Instead of 3 tabs daily, they corrected their error and pt can pick up Rx today

## 2018-08-30 NOTE — Telephone Encounter (Signed)
Patient is calling back in regards to refill  She would like a call back to discussed a problem she is having        [facility] # [phone] until 2pm  Then after 2pm [facility] C/B #  [phone]

## 2018-08-31 ENCOUNTER — Encounter: Payer: Self-pay | Admitting: Family Medicine

## 2018-09-01 ENCOUNTER — Telehealth: Payer: Self-pay | Admitting: Family Medicine

## 2018-09-01 ENCOUNTER — Other Ambulatory Visit: Payer: Self-pay | Admitting: Family Medicine

## 2018-09-01 DIAGNOSIS — R6889 Other general symptoms and signs: Secondary | ICD-10-CM | POA: Diagnosis not present

## 2018-09-01 DIAGNOSIS — Z20822 Contact with and (suspected) exposure to covid-19: Secondary | ICD-10-CM

## 2018-09-01 DIAGNOSIS — R509 Fever, unspecified: Secondary | ICD-10-CM | POA: Insufficient documentation

## 2018-09-01 NOTE — Telephone Encounter (Signed)
Order right per Terri. Pt notified test is ordered and address given to pt

## 2018-09-01 NOTE — Telephone Encounter (Signed)
I ordered the test (it printed - did I do it right?) Please call her  Thanks

## 2018-09-01 NOTE — Telephone Encounter (Signed)
Pt's surgery has been cancelled, pt said  2 nights ago she had a fever, but it resolved today, but she is still real shaky  feeling and has no energy, no sinus sxs but given the fever and other sxs pt is requesting a Covid-19 test. She was going to have one at the hospital today but since they cancelled her surgery they cancelled the testing too. Pt lives in [place] and wants to get tested asap, please advise

## 2018-09-01 NOTE — Telephone Encounter (Signed)
Patient requested a call back It is In regards to the surgery she was suppose to have on Friday    [facility] # [phone]

## 2018-09-02 ENCOUNTER — Other Ambulatory Visit: Admission: RE | Admit: 2018-09-02 | Payer: PPO | Source: Ambulatory Visit

## 2018-09-03 LAB — NOVEL CORONAVIRUS, NAA: SARS-CoV-2, NAA: NOT DETECTED

## 2018-09-07 ENCOUNTER — Ambulatory Visit: Admission: RE | Admit: 2018-09-07 | Payer: PPO | Source: Home / Self Care | Admitting: Ophthalmology

## 2018-09-07 ENCOUNTER — Encounter: Admission: RE | Payer: Self-pay | Source: Home / Self Care

## 2018-09-07 SURGERY — PHACOEMULSIFICATION, CATARACT, WITH IOL INSERTION
Anesthesia: Topical | Laterality: Right

## 2018-09-14 ENCOUNTER — Other Ambulatory Visit: Payer: Self-pay | Admitting: Family Medicine

## 2018-09-16 ENCOUNTER — Other Ambulatory Visit: Payer: Self-pay

## 2018-09-20 ENCOUNTER — Encounter: Payer: Self-pay | Admitting: Psychiatry

## 2018-09-20 ENCOUNTER — Other Ambulatory Visit: Payer: Self-pay

## 2018-09-20 ENCOUNTER — Ambulatory Visit (INDEPENDENT_AMBULATORY_CARE_PROVIDER_SITE_OTHER): Payer: PPO | Admitting: Psychiatry

## 2018-09-20 VITALS — BP 119/68 | HR 76

## 2018-09-20 DIAGNOSIS — G4700 Insomnia, unspecified: Secondary | ICD-10-CM | POA: Diagnosis not present

## 2018-09-20 DIAGNOSIS — F411 Generalized anxiety disorder: Secondary | ICD-10-CM

## 2018-09-20 DIAGNOSIS — G2581 Restless legs syndrome: Secondary | ICD-10-CM | POA: Diagnosis not present

## 2018-09-20 MED ORDER — SERTRALINE HCL 100 MG PO TABS: ORAL_TABLET | ORAL | 1 refills | Status: DC

## 2018-09-20 MED ORDER — ALPRAZOLAM 0.5 MG PO TABS: 0.5000 mg | ORAL_TABLET | Freq: Two times a day (BID) | ORAL | 5 refills | Status: DC | PRN

## 2018-09-20 MED ORDER — TRAZODONE HCL 100 MG PO TABS: ORAL_TABLET | ORAL | 1 refills | Status: DC

## 2018-09-20 NOTE — Progress Notes (Signed)
Suzanne Strong [id number] [birth year] 73 y.o.  Subjective:   Patient ID:  Suzanne Strong is a 73 y.o. (DOB 03/26/1945) female.  Chief Complaint:  Chief Complaint  Patient presents with  . Follow-up    h/o Insomnia, RLS, and anxiety    HPI  Suzanne Strong presents to the office today for follow-up of insomnia and anxiety. Has been taking Trazodone 100 mg po QHS and this has been more effective for insomnia than the 50 mg po QHS. She reports that her neuropathy and RLS are better controlled since PCP changed Gabapentin to Lyrica. She reports sleeping from 11:30 until 6-7 am. She reports that her mood has been "good" and denies any recent depression. She reports that anxiety has been well controlled. Has noticed some situational stress on a few occasions when she has had multiple deadlines. Denies having any physical s/s with anxiety. Concentration has been adequate. Appetite has been stable. She reports that her energy and motivation have been good. Has been avoiding caffeine. Denies SI.   Reports taking Xanax prn once a week or less.   Works part-time doing record keeping at a [employer]  Past Psychiatric Medication Trials: Remeron Xanax Gabapentin- Unsure if this is helpful for RLS Ropinorole- Helpful for RLS Zoloft Trazodone  Review of Systems:  Review of Systems  Eyes:       Reports that she is awaiting cataract surgery  Cardiovascular:       Has worn a heart monitor.  Musculoskeletal: Negative for gait problem.  Neurological: Negative for tremors.       Improved neuropathy and RLS with Lyrica  Psychiatric/Behavioral:       Please refer to HPI  Reports that she has had 4 episodes in the last year where her legs become weak and she feels as if she may pass out. Denies LOC. Reports that she had brain MRI and heart monitor were ordered to r/o potential causes. Reports that last episode occurred in July.   Medications: I have reviewed the patient's current  medications.  Current Outpatient Medications  Medication Sig Dispense Refill  . albuterol (VENTOLIN HFA) 108 (90 Base) MCG/ACT inhaler Inhale 2 puffs into the lungs every 6 (six) hours as needed for wheezing or shortness of breath (tight chest). 1 Inhaler 3  . ALPRAZolam (XANAX) 0.5 MG tablet Take 1 tablet (0.5 mg total) by mouth 2 (two) times daily as needed for anxiety or sleep. 60 tablet 5  . amitriptyline (ELAVIL) 50 MG tablet Take 1 tablet (50 mg total) by mouth at bedtime. 90 tablet 3  . atorvastatin (LIPITOR) 10 MG tablet TAKE 1 TABLET(10 MG) BY MOUTH DAILY 90 tablet 1  . b complex vitamins tablet Take 1 tablet by mouth daily.      . Cholecalciferol (VITAMIN D-1000 MAX ST) 25 MCG (1000 UT) tablet Take 4,000 Units by mouth daily.    . cyanocobalamin (,VITAMIN B-12,) 1000 MCG/ML injection Inject 1,000 mcg into the muscle every 30 (thirty) days.      Lyndle Herrlich SULFATE PO Take by mouth.    [name] glipiZIDE (GLUCOTROL) 10 MG tablet Take 10 mg by mouth 2 (two) times daily before a meal.     . JANUMET XR 50-1000 MG TB24 Take 1 tablet by mouth 2 (two) times daily.   0  . Multiple Vitamins-Minerals (WOMENS MULTIVITAMIN PO) Take by mouth.    [name] omeprazole (PRILOSEC) 20 MG capsule TAKE 1 CAPSULE(20 MG) BY MOUTH AT BEDTIME 90 capsule 1  .  pregabalin (LYRICA) 150 MG capsule Take 1 capsule by mouth 2 (two) times a day.    [name] rOPINIRole (REQUIP) 1 MG tablet TAKE 3 TABLETS BY MOUTH AT BEDTIME 90 tablet 0  . sertraline (ZOLOFT) 100 MG tablet Use 2 a day 180 tablet 1  . traZODone (DESYREL) 100 MG tablet Take 1/2-1 tablet po QHS prn insomnia 90 tablet 1  . fluticasone (FLONASE) 50 MCG/ACT nasal spray instill 2 sprays into each nostril once daily (Patient not taking: Reported on 09/20/2018) 16 g 11  . lidocaine-prilocaine (EMLA) cream Apply 1 application topically as needed. Apply to port when going through Chemo     No current facility-administered medications for this visit.    Facility-Administered Medications  Ordered in Other Visits  Medication Dose Route Frequency Provider Last Rate Last Dose  . sodium chloride flush (NS) 0.9 % injection 10 mL  10 mL Intravenous PRN Cammie Sickle, MD   10 mL at 09/11/15 0845    Medication Side Effects: None  Allergies:  Allergies  Allergen Reactions  . Amoxicillin Swelling    REACTION: rash, swelling Has patient had a PCN reaction causing immediate rash, facial/tongue/throat swelling, SOB or lightheadedness with hypotension: yes Has patient had a PCN reaction causing severe rash involving mucus membranes or skin necrosis: no Has patient had a PCN reaction that required hospitalization: no Has patient had a PCN reaction occurring within the last 10 years: yes If all of the above answers are "NO", then may proceed with Cephalosporin use.   [name] Fluconazole Rash    REACTION: hives  . Difuleron [Ferrous Fumarate-Dss]   . 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 Anemia    iron deficiency and B12 deficiency  . Anxiety   . Breast cancer ([place]) 02/25/2015   upper-outer quadrant of left female breast, Triple Negative. Neo-adjuvant chemo with complete pathologic response.   . Cervical dysplasia    conization  . Diabetes mellitus without complication ([place])   . Diverticulosis   . Fever 04/11/2015  . Gastroparesis    related to previous radiation tx  . GERD (gastroesophageal reflux disease)   . Hyperlipidemia   . Hypertension   . Personal history of radiation therapy 2017   LEFT lumpectomy w/ radiation  . Shingles    Hx of  . 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 Osteoporosis Mother   . Hyperlipidemia Mother   . Depression Mother   . Cancer Mother        skin cancer ? basal cell and lung ca heavy smoker  . Alcohol abuse Father   . Cancer Father        skin CA ? basal cell  . Heart disease  Father        CHF  . Hyperlipidemia Brother   . Hypertension Brother   . Breast cancer Neg Hx     Social History   Socioeconomic History  . Marital status: Married    Spouse name: Not on file  . Number of children: 0  . Years of education: 33  . Highest education level: Not on file  Occupational History  . Occupation: Clinical cytogeneticist  Social Needs  . Financial resource strain: Not on file  . Food insecurity    Worry: Not on file    Inability:  Not on file  . Transportation needs    Medical: Not on file    Non-medical: Not on file  Tobacco Use  . Smoking status: Former Smoker    Packs/day: 0.50    Years: 6.00    Pack years: 3.00    Types: Cigarettes    Quit date: 03/10/1984    Years since quitting: 34.5  . Smokeless tobacco: Never Used  Substance and Sexual Activity  . Alcohol use: No    Alcohol/week: 0.0 standard drinks  . Drug use: No  . Sexual activity: Not Currently  Lifestyle  . Physical activity    Days per week: Not on file    Minutes per session: Not on file  . Stress: Not on file  Relationships  . Social Herbalist on phone: Not on file    Gets together: Not on file    Attends religious service: Not on file    Active member of club or organization: Not on file    Attends meetings of clubs or organizations: Not on file    Relationship status: Not on file  . Intimate partner violence    Fear of current or ex partner: Not on file    Emotionally abused: Not on file    Physically abused: Not on file    Forced sexual activity: Not on file  Other Topics Concern  . Not on file  Social History Narrative   Lives with husband in a one story home.  No children.        Works as Radiation protection practitioner for [employer].        Education: college.     Past Medical History, Surgical history, Social history, and Family history were reviewed and updated as appropriate.   Please see review of systems for further details on the patient's review from today.    Objective:   Physical Exam:  BP 119/68   Pulse 76   Physical Exam Constitutional:      General: She is not in acute distress.    Appearance: She is well-developed.  Musculoskeletal:        General: No deformity.  Neurological:     Mental Status: She is alert and oriented to person, place, and time.     Coordination: Coordination normal.  Psychiatric:        Attention and Perception: Attention and perception normal. She does not perceive auditory or visual hallucinations.        Mood and Affect: Mood normal. Mood is not anxious or depressed. Affect is not labile, blunt, angry or inappropriate.        Speech: Speech normal.        Behavior: Behavior normal.        Thought Content: Thought content normal. Thought content does not include homicidal or suicidal ideation. Thought content does not include homicidal or suicidal plan.        Cognition and Memory: Cognition and memory normal.        Judgment: Judgment normal.     Comments: Insight intact. No delusions.      Lab Review:     Component Value Date/Time   NA 142 07/15/2018 0927   NA 142 03/01/2014 0846   K 4.3 07/15/2018 0927   K 4.3 03/01/2014 0846   CL 105 07/15/2018 0927   CL 102 03/01/2014 0846   CO2 28 07/15/2018 0927   CO2 28 03/01/2014 0846   GLUCOSE 130 (H) 07/15/2018 0927   GLUCOSE  219 (H) 03/01/2014 0846   BUN 13 07/15/2018 0927   BUN 12 03/01/2014 0846   CREATININE 0.86 07/15/2018 0927   CREATININE 0.95 03/01/2014 0846   CALCIUM 9.3 07/15/2018 0927   CALCIUM 8.6 03/01/2014 0846   PROT 6.8 07/15/2018 0927   PROT 7.5 03/01/2014 0846   ALBUMIN 3.7 07/15/2018 0927   ALBUMIN 3.4 03/01/2014 0846   AST 17 07/15/2018 0927   AST 20 03/01/2014 0846   ALT 14 07/15/2018 0927   ALT 28 03/01/2014 0846   ALKPHOS 74 07/15/2018 0927   ALKPHOS 122 (H) 03/01/2014 0846   BILITOT 0.3 07/15/2018 0927   BILITOT 0.2 03/01/2014 0846   GFRNONAA 56 (L) 04/22/2018 1030   GFRNONAA >60 03/01/2014 0846   GFRNONAA >60  08/24/2013 1015   GFRAA >60 04/22/2018 1030   GFRAA >60 03/01/2014 0846   GFRAA >60 08/24/2013 1015       Component Value Date/Time   WBC 4.4 07/15/2018 0927   RBC 3.77 (L) 07/15/2018 0927   HGB 11.0 (L) 07/15/2018 0927   HGB 11.5 (L) 03/01/2014 0846   HCT 32.6 (L) 07/15/2018 0927   HCT 36.4 03/01/2014 0846   PLT 206.0 07/15/2018 0927   PLT 254 03/01/2014 0846   MCV 86.4 07/15/2018 0927   MCV 81 03/01/2014 0846   MCH 27.1 04/22/2018 1030   MCHC 33.8 07/15/2018 0927   RDW 15.4 07/15/2018 0927   RDW 15.6 (H) 03/01/2014 0846   LYMPHSABS 1.0 07/15/2018 0927   LYMPHSABS 1.0 03/01/2014 0846   MONOABS 0.3 07/15/2018 0927   MONOABS 0.3 03/01/2014 0846   EOSABS 0.2 07/15/2018 0927   EOSABS 0.1 03/01/2014 0846   BASOSABS 0.0 07/15/2018 0927   BASOSABS 0.0 03/01/2014 0846    No results found for: POCLITH, LITHIUM   No results found for: PHENYTOIN, PHENOBARB, VALPROATE, CBMZ   .res Assessment: Plan:   Will continue current plan of care since patient reports that target signs and symptoms are well controlled without any significant tolerability issues. Patient to follow-up in 6 months or sooner if clinically indicated. Patient advised to contact office with any questions, adverse effects, or acute worsening in signs and symptoms.   Suzanne Strong was seen today for follow-up.  Diagnoses and all orders for this visit:  Insomnia, unspecified type -     Discontinue: traZODone (DESYREL) 100 MG tablet; Take 1/2-1 tablet po QHS prn insomnia -     traZODone (DESYREL) 100 MG tablet; Take 1/2-1 tablet po QHS prn insomnia  Anxiety state -     ALPRAZolam (XANAX) 0.5 MG tablet; Take 1 tablet (0.5 mg total) by mouth 2 (two) times daily as needed for anxiety or sleep. -     sertraline (ZOLOFT) 100 MG tablet; Use 2 a day  RLS (restless legs syndrome)     Please see After Visit Summary for patient specific instructions.  Future Appointments  Date Time Provider [place]  10/20/2018  10:30 AM [facility] None  10/20/2018 11:00 AM Cammie Sickle, MD [facility] None  03/23/2019 10:00 AM Thayer Headings, PMHNP [facility] None  05/03/2019  2:00 PM Chrystal, Eulas Post, MD [facility] None    No orders of the defined types were placed in this encounter.   -------------------------------

## 2018-09-28 ENCOUNTER — Telehealth: Payer: Self-pay

## 2018-09-28 NOTE — Telephone Encounter (Signed)
Pt called back she was informed of results. She was waiting to for heart monitor results prior to contacting surgeon office. Pt was informed to have surgeon office contact our office for what they need for her clearance.

## 2018-09-28 NOTE — Telephone Encounter (Signed)
Called patient again no answer Also tried calling patient spouse no answer voice mail box not set up Will try again later

## 2018-09-28 NOTE — Telephone Encounter (Signed)
-----   Message from Alda Berthold, DO sent at 09/23/2018  4:47 PM EDT ----- Please inform patient that her heart monitor is normal and did not show any abnormal rhythms.  She was requesting surgical clearance - I have not received anything from her surgeon's office.  Thanks

## 2018-09-28 NOTE — Telephone Encounter (Signed)
Called patient no answer Phone rings then hangs up Will try back later

## 2018-09-29 ENCOUNTER — Telehealth: Payer: Self-pay | Admitting: Neurology

## 2018-09-29 NOTE — Telephone Encounter (Signed)
Pt is having surgery on 10-26-18 and they need a letter faxed to [phone] stating that she is clear to have surgery

## 2018-09-30 ENCOUNTER — Other Ambulatory Visit: Payer: Self-pay | Admitting: Family Medicine

## 2018-09-30 NOTE — Telephone Encounter (Signed)
OK to write letter stating that patient is neurologically cleared to undergo surgery.

## 2018-09-30 NOTE — Telephone Encounter (Signed)
Still have not receive anything from surgeon office Will you be witting a letter for clearance Do patient need a pre-op appt?

## 2018-09-30 NOTE — Telephone Encounter (Signed)
Noted Letter completed on provider desk to sign once she return to office Patient aware of this

## 2018-09-30 NOTE — Telephone Encounter (Signed)
CPE was on 07/22/18, last filled on 08/26/18 #90 tabs with 0 refills, please advise

## 2018-10-18 ENCOUNTER — Encounter: Payer: Self-pay | Admitting: *Deleted

## 2018-10-18 ENCOUNTER — Other Ambulatory Visit: Payer: Self-pay

## 2018-10-19 ENCOUNTER — Other Ambulatory Visit: Payer: Self-pay

## 2018-10-19 DIAGNOSIS — C50412 Malignant neoplasm of upper-outer quadrant of left female breast: Secondary | ICD-10-CM

## 2018-10-19 DIAGNOSIS — Z171 Estrogen receptor negative status [ER-]: Secondary | ICD-10-CM

## 2018-10-20 ENCOUNTER — Inpatient Hospital Stay: Payer: PPO | Attending: Internal Medicine

## 2018-10-20 ENCOUNTER — Other Ambulatory Visit: Payer: Self-pay

## 2018-10-20 ENCOUNTER — Inpatient Hospital Stay (HOSPITAL_BASED_OUTPATIENT_CLINIC_OR_DEPARTMENT_OTHER): Payer: PPO | Admitting: Internal Medicine

## 2018-10-20 DIAGNOSIS — D649 Anemia, unspecified: Secondary | ICD-10-CM | POA: Diagnosis not present

## 2018-10-20 DIAGNOSIS — Z85818 Personal history of malignant neoplasm of other sites of lip, oral cavity, and pharynx: Secondary | ICD-10-CM | POA: Insufficient documentation

## 2018-10-20 DIAGNOSIS — E785 Hyperlipidemia, unspecified: Secondary | ICD-10-CM | POA: Insufficient documentation

## 2018-10-20 DIAGNOSIS — Z87891 Personal history of nicotine dependence: Secondary | ICD-10-CM | POA: Diagnosis not present

## 2018-10-20 DIAGNOSIS — Z79899 Other long term (current) drug therapy: Secondary | ICD-10-CM | POA: Insufficient documentation

## 2018-10-20 DIAGNOSIS — R42 Dizziness and giddiness: Secondary | ICD-10-CM | POA: Insufficient documentation

## 2018-10-20 DIAGNOSIS — Z7984 Long term (current) use of oral hypoglycemic drugs: Secondary | ICD-10-CM | POA: Insufficient documentation

## 2018-10-20 DIAGNOSIS — E119 Type 2 diabetes mellitus without complications: Secondary | ICD-10-CM | POA: Diagnosis not present

## 2018-10-20 DIAGNOSIS — Z923 Personal history of irradiation: Secondary | ICD-10-CM | POA: Diagnosis not present

## 2018-10-20 DIAGNOSIS — Z9221 Personal history of antineoplastic chemotherapy: Secondary | ICD-10-CM | POA: Diagnosis not present

## 2018-10-20 DIAGNOSIS — Z791 Long term (current) use of non-steroidal anti-inflammatories (NSAID): Secondary | ICD-10-CM | POA: Insufficient documentation

## 2018-10-20 DIAGNOSIS — Z171 Estrogen receptor negative status [ER-]: Secondary | ICD-10-CM | POA: Insufficient documentation

## 2018-10-20 DIAGNOSIS — F419 Anxiety disorder, unspecified: Secondary | ICD-10-CM | POA: Diagnosis not present

## 2018-10-20 DIAGNOSIS — I10 Essential (primary) hypertension: Secondary | ICD-10-CM | POA: Diagnosis not present

## 2018-10-20 DIAGNOSIS — Z853 Personal history of malignant neoplasm of breast: Secondary | ICD-10-CM | POA: Diagnosis not present

## 2018-10-20 DIAGNOSIS — C50412 Malignant neoplasm of upper-outer quadrant of left female breast: Secondary | ICD-10-CM

## 2018-10-20 LAB — COMPREHENSIVE METABOLIC PANEL
ALT: 21 U/L (ref 0–44)
AST: 25 U/L (ref 15–41)
Albumin: 3.6 g/dL (ref 3.5–5.0)
Alkaline Phosphatase: 66 U/L (ref 38–126)
Anion gap: 8 (ref 5–15)
BUN: 18 mg/dL (ref 8–23)
CO2: 29 mmol/L (ref 22–32)
Calcium: 9.2 mg/dL (ref 8.9–10.3)
Chloride: 104 mmol/L (ref 98–111)
Creatinine, Ser: 0.9 mg/dL (ref 0.44–1.00)
GFR calc Af Amer: >60 mL/min (ref >60)
GFR calc non Af Amer: >60 mL/min (ref >60)
Glucose, Bld: 95 mg/dL (ref 70–99)
Potassium: 4.5 mmol/L (ref 3.5–5.1)
Sodium: 141 mmol/L (ref 135–145)
Total Bilirubin: 0.5 mg/dL (ref 0.3–1.2)
Total Protein: 7.3 g/dL (ref 6.5–8.1)

## 2018-10-20 LAB — CBC WITH DIFFERENTIAL/PLATELET
Abs Immature Granulocytes: 0.02 10*3/uL (ref 0.00–0.07)
Basophils Absolute: 0 10*3/uL (ref 0.0–0.1)
Basophils Relative: 0 %
Eosinophils Absolute: 0.2 10*3/uL (ref 0.0–0.5)
Eosinophils Relative: 4 %
HCT: 33.4 % — ABNORMAL LOW (ref 36.0–46.0)
Hemoglobin: 10.4 g/dL — ABNORMAL LOW (ref 12.0–15.0)
Immature Granulocytes: 0 %
Lymphocytes Relative: 22 %
Lymphs Abs: 1.1 10*3/uL (ref 0.7–4.0)
MCH: 28.6 pg (ref 26.0–34.0)
MCHC: 31.1 g/dL (ref 30.0–36.0)
MCV: 91.8 fL (ref 80.0–100.0)
Monocytes Absolute: 0.4 10*3/uL (ref 0.1–1.0)
Monocytes Relative: 8 %
Neutro Abs: 3.5 10*3/uL (ref 1.7–7.7)
Neutrophils Relative %: 66 %
Platelets: 185 10*3/uL (ref 150–400)
RBC: 3.64 MIL/uL — ABNORMAL LOW (ref 3.87–5.11)
RDW: 13.8 % (ref 11.5–15.5)
WBC: 5.2 10*3/uL (ref 4.0–10.5)
nRBC: 0 % (ref 0.0–0.2)

## 2018-10-20 NOTE — Progress Notes (Signed)
[place] OFFICE PROGRESS NOTE  Patient Care Team: Tower, [name], MD as PCP - General Byrnett, [name], MD (General Surgery) [name], MD (Inactive) (Oncology) Alda Berthold, DO as Consulting Physician (Neurology)  Cancer Staging No matching staging information was found for the patient.   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 5 CM TUMOR MASS BIOPSIES POSITIVE FOR INVASIVE MAMMARY CARCINOMA TRIPLE NEGATIVE DISEASE(jANUARY, 2017)stage IIIa. ER/PR/her2 Neu-NEGATIVE  # Cytoxan and Adriamycin from March 18, 2015; carbo-taxol x4 cycles  # AUg 18th s/p Lumpec & SLNBx- Complete path CR [ypT0 ypsn0] s/p RT [oct 2017; Dr.Crystal]  3. MUGA scan of the heart shows ejection fraction to be 61% (January, 2017) 4  ------------------------------------------------------------    DIAGNOSIS: BREAST CANCER  STAGE:  III       ;GOALS: curative  CURRENT/MOST RECENT THERAPY: surveillaince.     Carcinoma of upper-outer quadrant of left breast in female, estrogen receptor negative ([place])  11/21/2015 Initial Diagnosis   Carcinoma of upper-outer quadrant of left breast in female, estrogen receptor negative ([place])     INTERVAL HISTORY:  Suzanne Strong 73 y.o.  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  Patient denies any worsening cough but denies any shortness of breath.  Appetite is good.  Patient continues her chronic intermittent tingling and numbness in extremities.  Is not getting any worse.  No falls.  Patient continues to have intermittent dizzy spells status post evaluation with ENT neurology physical therapy.  Neither better nor worse stable.   Denies any headaches.  Review of Systems  Constitutional: Negative for chills, diaphoresis, fever, malaise/fatigue and weight loss.  HENT: Negative for nosebleeds and sore throat.   Eyes: Negative for double vision.  Respiratory: Negative for hemoptysis, shortness of breath and wheezing.   Cardiovascular: Negative for chest pain, palpitations, orthopnea and leg swelling.  Gastrointestinal: Negative for abdominal pain, blood in stool, constipation, diarrhea, heartburn, melena, nausea and vomiting.  Genitourinary: Negative for dysuria, frequency and urgency.  Musculoskeletal: Negative for back pain and joint pain.  Skin: Negative.  Negative for itching and rash.  Neurological: Positive for dizziness and tingling. Negative for focal weakness, weakness and headaches.  Endo/Heme/Allergies: Does not bruise/bleed easily.  Psychiatric/Behavioral: Negative for depression. The patient is not nervous/anxious and does not have insomnia.       PAST MEDICAL HISTORY :  Past Medical History:  Diagnosis Date  . Anemia    iron deficiency and B12 deficiency  . Anxiety   . Breast cancer ([place]) 02/25/2015   upper-outer quadrant of left female breast, Triple Negative. Neo-adjuvant chemo with complete pathologic response.   . Cervical dysplasia    conization  . Diabetes mellitus without complication ([place])   . Diverticulosis   . Fever 04/11/2015  . Gastroparesis    related to previous radiation tx  . GERD (gastroesophageal reflux disease)   . Hyperlipidemia   . Hypertension   . Neuropathy    legs/feet, S/P chemo meds  . Personal history of radiation therapy 2017   LEFT lumpectomy w/ radiation  . RLS (restless legs syndrome)   . Shingles    Hx of  . 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[place]) 2006  . Wears dentures    partial bottom  PAST SURGICAL HISTORY :   Past Surgical History:  Procedure Laterality Date  . APPENDECTOMY    . BREAST BIOPSY Left 02/25/2015   INVASIVE MAMMARY CARCINOMA,Triple negative.  [name]  BREAST CYST ASPIRATION    . BREAST EXCISIONAL BIOPSY Left 12/2015   surgery  . BREAST LUMPECTOMY WITH NEEDLE LOCALIZATION Left 10/02/2015   Procedure: BREAST LUMPECTOMY WITH NEEDLE LOCALIZATION;  Surgeon: [name], MD;  Location: [facility] ORS;  Service: General;  Laterality: Left;  . BREAST LUMPECTOMY WITH SENTINEL LYMPH NODE BIOPSY Left 10/02/2015   Procedure: LEFT BREAST WIDE EXCISION WITH SENTINEL LYMPH NODE BX;  Surgeon: [name], MD;  Location: [facility] ORS;  Service: General;  Laterality: Left;  . BREAST SURGERY     breast biopsy benign  . CARPAL TUNNEL RELEASE Right 05/19/2017   Procedure: CARPAL TUNNEL RELEASE;  Surgeon: Earnestine Leys, MD;  Location: [facility] ORS;  Service: Orthopedics;  Laterality: Right;  . CHOLECYSTECTOMY    . Cologuard  07/22/2016   Negative  . ESOPHAGOGASTRODUODENOSCOPY  07/2009   normal with few gastric polyps  . MOLE REMOVAL    . PORT-A-CATH REMOVAL    . PORTACATH PLACEMENT    . PORTACATH PLACEMENT Right 03/12/2015   Procedure: INSERTION PORT-A-CATH;  Surgeon: [name], MD;  Location: [facility] ORS;  Service: General;  Laterality: Right;  . RADICAL NECK DISSECTION    . TONSILLECTOMY     cancer treated with chemo and radiation  . TRIGGER FINGER RELEASE Right 05/19/2017   Procedure: RELEASE TRIGGER FINGER/A-1 PULLEY ring finger;  Surgeon: Earnestine Leys, MD;  Location: [facility] ORS;  Service: Orthopedics;  Laterality: Right;    FAMILY HISTORY :   Family History  Problem Relation Age of Onset  . Osteoporosis Mother   . Hyperlipidemia Mother   . Depression Mother   . Cancer Mother        skin cancer ? basal cell and lung ca heavy smoker  . Alcohol abuse Father   . Cancer Father        skin CA ? basal cell  . Heart disease Father        CHF  . Hyperlipidemia Brother   . Hypertension Brother   . Breast cancer Neg Hx     SOCIAL HISTORY:   Social History   Tobacco Use  . Smoking status: Former Smoker    Packs/day: 0.50    Years: 6.00     Pack years: 3.00    Types: Cigarettes    Quit date: 03/10/1984    Years since quitting: 34.6  . Smokeless tobacco: Never Used  Substance Use Topics  . Alcohol use: No    Alcohol/week: 0.0 standard drinks  . Drug use: No    ALLERGIES:  is allergic to amoxicillin; fluconazole; difuleron [ferrous fumarate-dss]; and other.  MEDICATIONS:  Current Outpatient Medications  Medication Sig Dispense Refill  . albuterol (VENTOLIN HFA) 108 (90 Base) MCG/ACT inhaler Inhale 2 puffs into the lungs every 6 (six) hours as needed for wheezing or shortness of breath (tight chest). 1 Inhaler 3  . ALPRAZolam (XANAX) 0.5 MG tablet Take 1 tablet (0.5 mg total) by mouth 2 (two) times daily as needed for anxiety or sleep. 60 tablet 5  . amitriptyline (ELAVIL) 50 MG tablet Take 1 tablet (50 mg total) by mouth at bedtime. 90 tablet 3  . atorvastatin (LIPITOR) 10 MG tablet TAKE 1 TABLET(10 MG) BY MOUTH DAILY 90 tablet 1  . b complex vitamins tablet Take 1 tablet by mouth  daily.      . Cholecalciferol (VITAMIN D-1000 MAX ST) 25 MCG (1000 UT) tablet Take 4,000 Units by mouth daily.    [name] FERROUS SULFATE PO Take by mouth.    . gabapentin (NEURONTIN) 300 MG capsule Take 900 mg by mouth at bedtime.    [name] glipiZIDE (GLUCOTROL) 10 MG tablet Take 10 mg by mouth 2 (two) times daily before a meal.     . JANUMET XR 50-1000 MG TB24 Take 1 tablet by mouth 2 (two) times daily.   0  . lidocaine-prilocaine (EMLA) cream Apply 1 application topically as needed. Apply to port when going through Chemo    . MELOXICAM PO Take by mouth as needed.    . Multiple Vitamins-Minerals (WOMENS MULTIVITAMIN PO) Take by mouth.    [name] omeprazole (PRILOSEC) 20 MG capsule TAKE 1 CAPSULE(20 MG) BY MOUTH AT BEDTIME 90 capsule 1  . pregabalin (LYRICA) 150 MG capsule Take 1 capsule by mouth 2 (two) times a day.    [name] rOPINIRole (REQUIP) 1 MG tablet TAKE 3 TABLETS BY MOUTH AT BEDTIME 90 tablet 0  . sertraline (ZOLOFT) 100 MG tablet Use 2 a day 180 tablet 1   . traZODone (DESYREL) 100 MG tablet Take 1/2-1 tablet po QHS prn insomnia 90 tablet 1  . fluticasone (FLONASE) 50 MCG/ACT nasal spray instill 2 sprays into each nostril once daily (Patient not taking: Reported on 09/20/2018) 16 g 11   No current facility-administered medications for this visit.    Facility-Administered Medications Ordered in Other Visits  Medication Dose Route Frequency Provider Last Rate Last Dose  . sodium chloride flush (NS) 0.9 % injection 10 mL  10 mL Intravenous PRN Cammie Sickle, MD   10 mL at 09/11/15 0845    PHYSICAL EXAMINATION: ECOG PERFORMANCE STATUS: 1 - Symptomatic but completely ambulatory  BP 108/69 (BP Location: Left Arm, Patient Position: Sitting, Cuff Size: Normal)   Pulse 83   Temp (!) 97.3 F (36.3 C) (Tympanic)   Resp 16   Wt 221 lb 6.4 oz (100.4 kg)   BMI 35.73 kg/m   Filed Weights   10/20/18 1103  Weight: 221 lb 6.4 oz (100.4 kg)    Physical Exam  Constitutional: She is oriented to person, place, and time and well-developed, well-nourished, and in no distress.  Obese.  She is walking herself.  Alone.  HENT:  Head: Normocephalic and atraumatic.  Mouth/Throat: Oropharynx is clear and moist. No oropharyngeal exudate.  Eyes: Pupils are equal, round, and reactive to light.  Neck: Normal range of motion. Neck supple.  Cardiovascular: Normal rate and regular rhythm.  Pulmonary/Chest: No respiratory distress. She has no wheezes.  Abdominal: Soft. Bowel sounds are normal. She exhibits no distension and no mass. There is no abdominal tenderness. There is no rebound and no guarding.  Musculoskeletal: Normal range of motion.        General: No tenderness or edema.  Neurological: She is alert and oriented to person, place, and time.  Skin: Skin is warm.  Psychiatric: Affect normal.    LABORATORY DATA:  I have reviewed the data as listed    Component Value Date/Time   NA 141 10/20/2018 1024   NA 142 03/01/2014 0846   K 4.5  10/20/2018 1024   K 4.3 03/01/2014 0846   CL 104 10/20/2018 1024   CL 102 03/01/2014 0846   CO2 29 10/20/2018 1024   CO2 28 03/01/2014 0846   GLUCOSE 95 10/20/2018 1024   GLUCOSE 219 (  H) 03/01/2014 0846   BUN 18 10/20/2018 1024   BUN 12 03/01/2014 0846   CREATININE 0.90 10/20/2018 1024   CREATININE 0.95 03/01/2014 0846   CALCIUM 9.2 10/20/2018 1024   CALCIUM 8.6 03/01/2014 0846   PROT 7.3 10/20/2018 1024   PROT 7.5 03/01/2014 0846   ALBUMIN 3.6 10/20/2018 1024   ALBUMIN 3.4 03/01/2014 0846   AST 25 10/20/2018 1024   AST 20 03/01/2014 0846   ALT 21 10/20/2018 1024   ALT 28 03/01/2014 0846   ALKPHOS 66 10/20/2018 1024   ALKPHOS 122 (H) 03/01/2014 0846   BILITOT 0.5 10/20/2018 1024   BILITOT 0.2 03/01/2014 0846   GFRNONAA >60 10/20/2018 1024   GFRNONAA >60 03/01/2014 0846   GFRNONAA >60 08/24/2013 1015   GFRAA >60 10/20/2018 1024   GFRAA >60 03/01/2014 0846   GFRAA >60 08/24/2013 1015    No results found for: SPEP, UPEP  Lab Results  Component Value Date   WBC 5.2 10/20/2018   NEUTROABS 3.5 10/20/2018   HGB 10.4 (L) 10/20/2018   HCT 33.4 (L) 10/20/2018   MCV 91.8 10/20/2018   PLT 185 10/20/2018      Chemistry      Component Value Date/Time   NA 141 10/20/2018 1024   NA 142 03/01/2014 0846   K 4.5 10/20/2018 1024   K 4.3 03/01/2014 0846   CL 104 10/20/2018 1024   CL 102 03/01/2014 0846   CO2 29 10/20/2018 1024   CO2 28 03/01/2014 0846   BUN 18 10/20/2018 1024   BUN 12 03/01/2014 0846   CREATININE 0.90 10/20/2018 1024   CREATININE 0.95 03/01/2014 0846      Component Value Date/Time   CALCIUM 9.2 10/20/2018 1024   CALCIUM 8.6 03/01/2014 0846   ALKPHOS 66 10/20/2018 1024   ALKPHOS 122 (H) 03/01/2014 0846   AST 25 10/20/2018 1024   AST 20 03/01/2014 0846   ALT 21 10/20/2018 1024   ALT 28 03/01/2014 0846   BILITOT 0.5 10/20/2018 1024   BILITOT 0.2 03/01/2014 0846       RADIOGRAPHIC STUDIES: I have personally reviewed the radiological images as  listed and agreed with the findings in the report. No results found.   ASSESSMENT & PLAN:  Carcinoma of upper-outer quadrant of left breast in female, estrogen receptor negative ([place]) # Left breast cancer stage III  TRIPLE NEGATIVE-  s/p neoadjuvant chemotherapy-currently under surveillance. Feb 2020- Mammo- WNL.  STABLE.   #Continue close surveillance evidence of recurrence.  # Mild anemia -hemoglobin 10-11 for the last 10 years; stable.  Discussed the etiology is unclear however since his chronic asymptomatic.  Hold off any bone marrow biopsy.  Patient agreeable.  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   # DISPOSITION: # follow up with MD/labs-cbc/cmp/ca-27-29-Dr.B   No orders of the defined types were placed in this encounter.  All questions were answered. The patient knows to call the clinic with any problems, questions or concerns.      Cammie Sickle, MD 10/20/2018 11:27 AM

## 2018-10-20 NOTE — Discharge Instructions (Signed)

## 2018-10-20 NOTE — Assessment & Plan Note (Addendum)
#  Left breast cancer stage III  TRIPLE NEGATIVE-  s/p neoadjuvant chemotherapy-currently under surveillance. Feb 2020- Mammo- WNL.  STABLE.   #Continue close surveillance evidence of recurrence.  # Mild anemia -hemoglobin 10-11 for the last 10 years; stable.  Discussed the etiology is unclear however since his chronic asymptomatic.  Hold off any bone marrow biopsy.  Patient agreeable.  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   # DISPOSITION: # follow up with MD/labs-cbc/cmp/ca-27-29-Dr.B

## 2018-10-21 ENCOUNTER — Other Ambulatory Visit
Admission: RE | Admit: 2018-10-21 | Discharge: 2018-10-21 | Disposition: A | Discharge status: Home / Self Care | Payer: PPO | Source: Ambulatory Visit | Attending: Ophthalmology | Admitting: Ophthalmology

## 2018-10-21 DIAGNOSIS — Z01812 Encounter for preprocedural laboratory examination: Secondary | ICD-10-CM | POA: Insufficient documentation

## 2018-10-21 DIAGNOSIS — Z20828 Contact with and (suspected) exposure to other viral communicable diseases: Secondary | ICD-10-CM | POA: Diagnosis not present

## 2018-10-21 LAB — CANCER ANTIGEN 27.29: CA 27.29: 13 U/mL (ref 0.0–38.6)

## 2018-10-21 LAB — SARS CORONAVIRUS 2 (TAT 6-24 HRS): SARS Coronavirus 2: NEGATIVE

## 2018-10-25 ENCOUNTER — Telehealth: Payer: Self-pay | Admitting: Internal Medicine

## 2018-10-25 NOTE — Telephone Encounter (Signed)
Left a message for the patient with my recommendation to repeat the tumor marker in 2 months.  Recommend call us if needed.

## 2018-10-25 NOTE — Anesthesia Preprocedure Evaluation (Addendum)
Anesthesia Evaluation  Patient identified by MRN, date of birth, ID band Patient awake    Reviewed: Allergy & Precautions, NPO status , Patient's Chart, lab work & pertinent test results  History of Anesthesia Complications Negative for: history of anesthetic complications  Airway Mallampati: IV   Neck ROM: Full    Dental  (+)    Pulmonary sleep apnea , former smoker (quit 1986),    Pulmonary exam normal breath sounds clear to auscultation       Cardiovascular hypertension, Normal cardiovascular exam Rhythm:Regular Rate:Normal     Neuro/Psych PSYCHIATRIC DISORDERS Anxiety Depression  Neuromuscular disease (neuropathy)    GI/Hepatic GERD  ,  Endo/Other  diabetes, Type 2  Renal/GU negative Renal ROS     Musculoskeletal   Abdominal   Peds  Hematology  (+) Blood dyscrasia, anemia , Hx breast and tonsillar CA   Anesthesia Other Findings   Reproductive/Obstetrics                            Anesthesia Physical Anesthesia Plan  ASA: II  Anesthesia Plan: MAC   Post-op Pain Management:    Induction: Intravenous  PONV Risk Score and Plan: 2 and TIVA and Midazolam  Airway Management Planned: Natural Airway  Additional Equipment:   Intra-op Plan:   Post-operative Plan:   Informed Consent: I have reviewed the patients History and Physical, chart, labs and discussed the procedure including the risks, benefits and alternatives for the proposed anesthesia with the patient or authorized representative who has indicated his/her understanding and acceptance.       Plan Discussed with: CRNA  Anesthesia Plan Comments:        Anesthesia Quick Evaluation

## 2018-10-26 ENCOUNTER — Ambulatory Visit: Payer: PPO | Admitting: Anesthesiology

## 2018-10-26 ENCOUNTER — Encounter
Admission: RE | Disposition: A | Discharge status: Home / Self Care | Payer: Self-pay | Source: Home / Self Care | Attending: Ophthalmology

## 2018-10-26 ENCOUNTER — Other Ambulatory Visit: Payer: Self-pay | Admitting: Family Medicine

## 2018-10-26 ENCOUNTER — Other Ambulatory Visit: Payer: Self-pay

## 2018-10-26 ENCOUNTER — Ambulatory Visit
Admission: RE | Admit: 2018-10-26 | Discharge: 2018-10-26 | Disposition: A | Discharge status: Home / Self Care | Payer: PPO | Attending: Ophthalmology | Admitting: Ophthalmology

## 2018-10-26 DIAGNOSIS — F329 Major depressive disorder, single episode, unspecified: Secondary | ICD-10-CM | POA: Insufficient documentation

## 2018-10-26 DIAGNOSIS — K219 Gastro-esophageal reflux disease without esophagitis: Secondary | ICD-10-CM | POA: Diagnosis not present

## 2018-10-26 DIAGNOSIS — Z87891 Personal history of nicotine dependence: Secondary | ICD-10-CM | POA: Diagnosis not present

## 2018-10-26 DIAGNOSIS — E1140 Type 2 diabetes mellitus with diabetic neuropathy, unspecified: Secondary | ICD-10-CM | POA: Diagnosis not present

## 2018-10-26 DIAGNOSIS — I10 Essential (primary) hypertension: Secondary | ICD-10-CM | POA: Insufficient documentation

## 2018-10-26 DIAGNOSIS — E7800 Pure hypercholesterolemia, unspecified: Secondary | ICD-10-CM | POA: Diagnosis not present

## 2018-10-26 DIAGNOSIS — H2511 Age-related nuclear cataract, right eye: Secondary | ICD-10-CM | POA: Insufficient documentation

## 2018-10-26 DIAGNOSIS — Z79899 Other long term (current) drug therapy: Secondary | ICD-10-CM | POA: Diagnosis not present

## 2018-10-26 DIAGNOSIS — H25811 Combined forms of age-related cataract, right eye: Secondary | ICD-10-CM | POA: Diagnosis not present

## 2018-10-26 DIAGNOSIS — D759 Disease of blood and blood-forming organs, unspecified: Secondary | ICD-10-CM | POA: Insufficient documentation

## 2018-10-26 DIAGNOSIS — Z880 Allergy status to penicillin: Secondary | ICD-10-CM | POA: Insufficient documentation

## 2018-10-26 DIAGNOSIS — G2581 Restless legs syndrome: Secondary | ICD-10-CM | POA: Diagnosis not present

## 2018-10-26 DIAGNOSIS — F419 Anxiety disorder, unspecified: Secondary | ICD-10-CM | POA: Diagnosis not present

## 2018-10-26 DIAGNOSIS — Z888 Allergy status to other drugs, medicaments and biological substances status: Secondary | ICD-10-CM | POA: Diagnosis not present

## 2018-10-26 DIAGNOSIS — D649 Anemia, unspecified: Secondary | ICD-10-CM | POA: Insufficient documentation

## 2018-10-26 DIAGNOSIS — G4730 Sleep apnea, unspecified: Secondary | ICD-10-CM | POA: Diagnosis not present

## 2018-10-26 HISTORY — PX: CATARACT EXTRACTION W/PHACO: SHX586

## 2018-10-26 HISTORY — DX: Presence of dental prosthetic device (complete) (partial): Z97.2

## 2018-10-26 HISTORY — DX: Polyneuropathy, unspecified: G62.9

## 2018-10-26 HISTORY — DX: Restless legs syndrome: G25.81

## 2018-10-26 LAB — GLUCOSE, CAPILLARY
Glucose-Capillary: 147 mg/dL — ABNORMAL HIGH (ref 70–99)
Glucose-Capillary: 74 mg/dL (ref 70–99)
Glucose-Capillary: 101 mg/dL — ABNORMAL HIGH (ref 70–99)

## 2018-10-26 SURGERY — PHACOEMULSIFICATION, CATARACT, WITH IOL INSERTION
Anesthesia: Monitor Anesthesia Care | Site: Eye | Laterality: Right

## 2018-10-26 MED ORDER — ACETAMINOPHEN 160 MG/5ML PO SOLN: 325.0000 mg | ORAL | Status: DC | PRN

## 2018-10-26 MED ORDER — BRIMONIDINE TARTRATE-TIMOLOL 0.2-0.5 % OP SOLN
OPHTHALMIC | Status: DC | PRN
  Administered 2018-10-26: 1 [drp] via OPHTHALMIC

## 2018-10-26 MED ORDER — EPINEPHRINE PF 1 MG/ML IJ SOLN
INTRAOCULAR | Status: DC | PRN
  Administered 2018-10-26: 10:00:00 56 mL via OPHTHALMIC

## 2018-10-26 MED ORDER — NA HYALUR & NA CHOND-NA HYALUR 0.4-0.35 ML IO KIT
PACK | INTRAOCULAR | Status: DC | PRN
  Administered 2018-10-26: 1 mL via INTRAOCULAR

## 2018-10-26 MED ORDER — ONDANSETRON HCL 4 MG/2ML IJ SOLN: 4.0000 mg | Freq: Once | INTRAMUSCULAR | Status: DC | PRN

## 2018-10-26 MED ORDER — MIDAZOLAM HCL 2 MG/2ML IJ SOLN
INTRAMUSCULAR | Status: DC | PRN
  Administered 2018-10-26: 2 mg via INTRAVENOUS

## 2018-10-26 MED ORDER — TETRACAINE HCL 0.5 % OP SOLN
1.0000 [drp] | OPHTHALMIC | Status: DC | PRN
  Administered 2018-10-26 (×3): 1 [drp] via OPHTHALMIC

## 2018-10-26 MED ORDER — LIDOCAINE HCL (PF) 2 % IJ SOLN
INTRAOCULAR | Status: DC | PRN
  Administered 2018-10-26: 10:00:00 1 mL

## 2018-10-26 MED ORDER — DEXTROSE 50 % IV SOLN
25.0000 mL | Freq: Once | INTRAVENOUS | Status: AC
  Administered 2018-10-26: 09:00:00 25 mL via INTRAVENOUS

## 2018-10-26 MED ORDER — ARMC OPHTHALMIC DILATING DROPS
1.0000 "application " | OPHTHALMIC | Status: DC | PRN
  Administered 2018-10-26 (×3): 1 via OPHTHALMIC

## 2018-10-26 MED ORDER — FENTANYL CITRATE (PF) 100 MCG/2ML IJ SOLN
INTRAMUSCULAR | Status: DC | PRN
  Administered 2018-10-26: 50 ug via INTRAVENOUS

## 2018-10-26 MED ORDER — ACETAMINOPHEN 325 MG PO TABS: 650.0000 mg | ORAL_TABLET | Freq: Once | ORAL | Status: DC | PRN

## 2018-10-26 MED ORDER — MOXIFLOXACIN HCL 0.5 % OP SOLN
OPHTHALMIC | Status: DC | PRN
  Administered 2018-10-26: 0.2 mL via OPHTHALMIC

## 2018-10-26 MED ORDER — MOXIFLOXACIN HCL 0.5 % OP SOLN
1.0000 [drp] | OPHTHALMIC | Status: DC | PRN
  Administered 2018-10-26 (×2): 1 [drp] via OPHTHALMIC

## 2018-10-26 SURGICAL SUPPLY — 26 items
CANNULA ANT/CHMB 27G (MISCELLANEOUS) ×1 IMPLANT
CANNULA ANT/CHMB 27GA (MISCELLANEOUS) ×3 IMPLANT
GLOVE SURG LX 7.5 STRW (GLOVE) ×4
GLOVE SURG LX STRL 7.5 STRW (GLOVE) ×1 IMPLANT
GLOVE SURG TRIUMPH 8.0 PF LTX (GLOVE) ×3 IMPLANT
GOWN STRL REUS W/ TWL LRG LVL3 (GOWN DISPOSABLE) ×2 IMPLANT
GOWN STRL REUS W/TWL LRG LVL3 (GOWN DISPOSABLE) ×6
LENS IOL TECNIS ITEC 21.5 (Intraocular Lens) ×2 IMPLANT
MARKER SKIN DUAL TIP RULER LAB (MISCELLANEOUS) ×3 IMPLANT
NDL FILTER BLUNT 18X1 1/2 (NEEDLE) IMPLANT
NDL RETROBULBAR .5 NSTRL (NEEDLE) IMPLANT
NEEDLE FILTER BLUNT 18X 1/2SAF (NEEDLE)
NEEDLE FILTER BLUNT 18X1 1/2 (NEEDLE) IMPLANT
PACK CATARACT BRASINGTON (MISCELLANEOUS) ×3 IMPLANT
PACK EYE AFTER SURG (MISCELLANEOUS) ×3 IMPLANT
PACK OPTHALMIC (MISCELLANEOUS) ×3 IMPLANT
RING MALYGIN 7.0 (MISCELLANEOUS) IMPLANT
SUT ETHILON 10-0 CS-B-6CS-B-6 (SUTURE)
SUT VICRYL  9 0 (SUTURE)
SUT VICRYL 9 0 (SUTURE) IMPLANT
SUTURE EHLN 10-0 CS-B-6CS-B-6 (SUTURE) IMPLANT
SYR 3ML LL SCALE MARK (SYRINGE) ×3 IMPLANT
SYR 5ML LL (SYRINGE) IMPLANT
SYR TB 1ML LUER SLIP (SYRINGE) ×3 IMPLANT
WATER STERILE IRR 500ML POUR (IV SOLUTION) ×3 IMPLANT
WIPE NON LINTING 3.25X3.25 (MISCELLANEOUS) ×3 IMPLANT

## 2018-10-26 NOTE — Op Note (Signed)
LOCATION:  [facility]   PREOPERATIVE DIAGNOSIS:    Nuclear sclerotic cataract right eye. H25.11   POSTOPERATIVE DIAGNOSIS:  Nuclear sclerotic cataract right eye.     PROCEDURE:  Phacoemusification with posterior chamber intraocular lens placement of the right eye   LENS:   Implant Name Type Inv. Item Serial No. Manufacturer Lot No. LRB No. Used Action  LENS IOL DIOP 21.5 - [id number] Intraocular Lens LENS IOL DIOP 21.5 [id number]  Right 1 Implanted      ULTRASOUND TIME: 15 % of 0 minutes, 49 seconds.  CDE 7.5   SURGEON:  Wyonia Hough, MD   ANESTHESIA:  Topical with tetracaine drops and 2% Xylocaine jelly, augmented with 1% preservative-free intracameral lidocaine.    COMPLICATIONS:  None.   DESCRIPTION OF PROCEDURE:  The patient was identified in the holding room and transported to the operating room and placed in the supine position under the operating microscope.  The right eye was identified as the operative eye and it was prepped and draped in the usual sterile ophthalmic fashion.   A 1 millimeter clear-corneal paracentesis was made at the 12:00 position.  0.5 ml of preservative-free 1% lidocaine was injected into the anterior chamber. The anterior chamber was filled with Viscoat viscoelastic.  A 2.4 millimeter keratome was used to make a near-clear corneal incision at the 9:00 position.  A curvilinear capsulorrhexis was made with a cystotome and capsulorrhexis forceps.  Balanced salt solution was used to hydrodissect and hydrodelineate the nucleus.   Phacoemulsification was then used in stop and chop fashion to remove the lens nucleus and epinucleus.  The remaining cortex was then removed using the irrigation and aspiration handpiece. Provisc was then placed into the capsular bag to distend it for lens placement.  A lens was then injected into the capsular bag.  The remaining viscoelastic was aspirated.   Wounds were hydrated with balanced salt solution.   The anterior chamber was inflated to a physiologic pressure with balanced salt solution.  No wound leaks were noted. Vigamox 0.2 ml of a 1mg  per ml solution was injected into the anterior chamber for a dose of 0.2 mg of intracameral antibiotic at the completion of the case.   Timolol and Brimonidine drops were applied to the eye.  The patient was taken to the recovery room in stable condition without complications of anesthesia or surgery.   Sireen Halk 10/26/2018, 10:10 AM

## 2018-10-26 NOTE — Anesthesia Procedure Notes (Signed)
Procedure Name: MAC Date/Time: 10/26/2018 9:56 AM Performed by: Georga Bora, CRNA Pre-anesthesia Checklist: Patient identified, Emergency Drugs available, Suction available, Patient being monitored and Timeout performed Oxygen Delivery Method: Nasal cannula

## 2018-10-26 NOTE — Transfer of Care (Signed)
Immediate Anesthesia Transfer of Care Note  Patient: Suzanne Strong  Procedure(s) Performed: CATARACT EXTRACTION PHACO AND INTRAOCULAR LENS PLACEMENT (IOC) right diabetic (Right Eye)  Patient Location: PACU  Anesthesia Type: MAC  Level of Consciousness: awake, alert  and patient cooperative  Airway and Oxygen Therapy: Patient Spontanous Breathing and Patient connected to supplemental oxygen  Post-op Assessment: Post-op Vital signs reviewed, Patient's Cardiovascular Status Stable, Respiratory Function Stable, Patent Airway and No signs of Nausea or vomiting  Post-op Vital Signs: Reviewed and stable  Complications: No apparent anesthesia complications

## 2018-10-26 NOTE — H&P (Signed)

## 2018-10-26 NOTE — Anesthesia Postprocedure Evaluation (Signed)
Anesthesia Post Note  Patient: Suzanne Strong  Procedure(s) Performed: CATARACT EXTRACTION PHACO AND INTRAOCULAR LENS PLACEMENT (IOC) right diabetic (Right Eye)  Patient location during evaluation: PACU Anesthesia Type: MAC Level of consciousness: awake and alert, oriented and patient cooperative Pain management: pain level controlled Vital Signs Assessment: post-procedure vital signs reviewed and stable Respiratory status: spontaneous breathing, nonlabored ventilation and respiratory function stable Cardiovascular status: blood pressure returned to baseline and stable Postop Assessment: adequate PO intake Anesthetic complications: no    Darrin Nipper

## 2018-10-27 ENCOUNTER — Encounter: Payer: Self-pay | Admitting: Ophthalmology

## 2018-11-04 DIAGNOSIS — I10 Essential (primary) hypertension: Secondary | ICD-10-CM | POA: Diagnosis not present

## 2018-11-04 DIAGNOSIS — H2512 Age-related nuclear cataract, left eye: Secondary | ICD-10-CM | POA: Diagnosis not present

## 2018-11-08 ENCOUNTER — Other Ambulatory Visit: Payer: Self-pay

## 2018-11-08 ENCOUNTER — Encounter: Payer: Self-pay | Admitting: *Deleted

## 2018-11-09 NOTE — Anesthesia Preprocedure Evaluation (Addendum)
Anesthesia Evaluation  Patient identified by MRN, date of birth, ID band Patient awake    Reviewed: Allergy & Precautions, H&P , NPO status , Patient's Chart, lab work & pertinent test results  History of Anesthesia Complications Negative for: history of anesthetic complications  Airway Mallampati: III  TM Distance: >3 FB Neck ROM: Full    Dental no notable dental hx. (+)    Pulmonary sleep apnea , former smoker,    Pulmonary exam normal breath sounds clear to auscultation       Cardiovascular hypertension, Normal cardiovascular exam Rhythm:regular Rate:Normal     Neuro/Psych PSYCHIATRIC DISORDERS Anxiety Depression  Neuromuscular disease (neuropathy)    GI/Hepatic GERD  ,  Endo/Other  diabetes, Type 2  Renal/GU negative Renal ROS     Musculoskeletal   Abdominal   Peds  Hematology  (+) Blood dyscrasia, anemia , Hx breast and tonsillar CA   Anesthesia Other Findings   Reproductive/Obstetrics                            Anesthesia Physical  Anesthesia Plan  ASA: III  Anesthesia Plan: MAC   Post-op Pain Management:    Induction:   PONV Risk Score and Plan: 2 and Treatment may vary due to age or medical condition, Midazolam and TIVA  Airway Management Planned:   Additional Equipment:   Intra-op Plan:   Post-operative Plan:   Informed Consent: I have reviewed the patients History and Physical, chart, labs and discussed the procedure including the risks, benefits and alternatives for the proposed anesthesia with the patient or authorized representative who has indicated his/her understanding and acceptance.       Plan Discussed with: CRNA  Anesthesia Plan Comments:         Anesthesia Quick Evaluation

## 2018-11-11 ENCOUNTER — Other Ambulatory Visit
Admission: RE | Admit: 2018-11-11 | Discharge: 2018-11-11 | Disposition: A | Discharge status: Home / Self Care | Payer: PPO | Source: Ambulatory Visit | Attending: Ophthalmology | Admitting: Ophthalmology

## 2018-11-11 DIAGNOSIS — Z20828 Contact with and (suspected) exposure to other viral communicable diseases: Secondary | ICD-10-CM | POA: Insufficient documentation

## 2018-11-11 DIAGNOSIS — Z01812 Encounter for preprocedural laboratory examination: Secondary | ICD-10-CM | POA: Diagnosis not present

## 2018-11-12 LAB — SARS CORONAVIRUS 2 (TAT 6-24 HRS): SARS Coronavirus 2: NEGATIVE

## 2018-11-15 NOTE — Discharge Instructions (Signed)

## 2018-11-16 ENCOUNTER — Ambulatory Visit: Payer: PPO | Admitting: Anesthesiology

## 2018-11-16 ENCOUNTER — Other Ambulatory Visit: Payer: Self-pay

## 2018-11-16 ENCOUNTER — Ambulatory Visit
Admission: RE | Admit: 2018-11-16 | Discharge: 2018-11-16 | Disposition: A | Discharge status: Home / Self Care | Payer: PPO | Attending: Ophthalmology | Admitting: Ophthalmology

## 2018-11-16 ENCOUNTER — Encounter
Admission: RE | Disposition: A | Discharge status: Home / Self Care | Payer: Self-pay | Source: Home / Self Care | Attending: Ophthalmology

## 2018-11-16 DIAGNOSIS — Z853 Personal history of malignant neoplasm of breast: Secondary | ICD-10-CM | POA: Insufficient documentation

## 2018-11-16 DIAGNOSIS — Z8589 Personal history of malignant neoplasm of other organs and systems: Secondary | ICD-10-CM | POA: Insufficient documentation

## 2018-11-16 DIAGNOSIS — G4730 Sleep apnea, unspecified: Secondary | ICD-10-CM | POA: Diagnosis not present

## 2018-11-16 DIAGNOSIS — E1140 Type 2 diabetes mellitus with diabetic neuropathy, unspecified: Secondary | ICD-10-CM | POA: Insufficient documentation

## 2018-11-16 DIAGNOSIS — I10 Essential (primary) hypertension: Secondary | ICD-10-CM | POA: Diagnosis not present

## 2018-11-16 DIAGNOSIS — Z87891 Personal history of nicotine dependence: Secondary | ICD-10-CM | POA: Diagnosis not present

## 2018-11-16 DIAGNOSIS — E1136 Type 2 diabetes mellitus with diabetic cataract: Secondary | ICD-10-CM | POA: Diagnosis not present

## 2018-11-16 DIAGNOSIS — H2512 Age-related nuclear cataract, left eye: Secondary | ICD-10-CM | POA: Diagnosis not present

## 2018-11-16 DIAGNOSIS — H25812 Combined forms of age-related cataract, left eye: Secondary | ICD-10-CM | POA: Diagnosis not present

## 2018-11-16 HISTORY — PX: CATARACT EXTRACTION W/PHACO: SHX586

## 2018-11-16 LAB — GLUCOSE, CAPILLARY
Glucose-Capillary: 86 mg/dL (ref 70–99)
Glucose-Capillary: 81 mg/dL (ref 70–99)

## 2018-11-16 SURGERY — PHACOEMULSIFICATION, CATARACT, WITH IOL INSERTION
Anesthesia: Monitor Anesthesia Care | Site: Eye | Laterality: Left

## 2018-11-16 MED ORDER — LIDOCAINE HCL (PF) 2 % IJ SOLN
INTRAOCULAR | Status: DC | PRN
  Administered 2018-11-16: 1 mL

## 2018-11-16 MED ORDER — MOXIFLOXACIN HCL 0.5 % OP SOLN
1.0000 [drp] | OPHTHALMIC | Status: DC | PRN
  Administered 2018-11-16 (×3): 1 [drp] via OPHTHALMIC

## 2018-11-16 MED ORDER — NA HYALUR & NA CHOND-NA HYALUR 0.4-0.35 ML IO KIT
PACK | INTRAOCULAR | Status: DC | PRN
  Administered 2018-11-16: 1 mL via INTRAOCULAR

## 2018-11-16 MED ORDER — ACETAMINOPHEN 325 MG PO TABS: 325.0000 mg | ORAL_TABLET | Freq: Once | ORAL | Status: DC

## 2018-11-16 MED ORDER — MOXIFLOXACIN HCL 0.5 % OP SOLN
OPHTHALMIC | Status: DC | PRN
  Administered 2018-11-16: 0.2 mL via OPHTHALMIC

## 2018-11-16 MED ORDER — TETRACAINE HCL 0.5 % OP SOLN
1.0000 [drp] | OPHTHALMIC | Status: DC | PRN
  Administered 2018-11-16 (×3): 1 [drp] via OPHTHALMIC

## 2018-11-16 MED ORDER — LACTATED RINGERS IV SOLN: INTRAVENOUS | Status: DC

## 2018-11-16 MED ORDER — ARMC OPHTHALMIC DILATING DROPS
1.0000 "application " | OPHTHALMIC | Status: DC | PRN
  Administered 2018-11-16 (×3): 1 via OPHTHALMIC

## 2018-11-16 MED ORDER — ACETAMINOPHEN 160 MG/5ML PO SOLN: 325.0000 mg | Freq: Once | ORAL | Status: DC

## 2018-11-16 MED ORDER — BRIMONIDINE TARTRATE-TIMOLOL 0.2-0.5 % OP SOLN
OPHTHALMIC | Status: DC | PRN
  Administered 2018-11-16: 1 [drp] via OPHTHALMIC

## 2018-11-16 MED ORDER — EPINEPHRINE PF 1 MG/ML IJ SOLN
INTRAOCULAR | Status: DC | PRN
  Administered 2018-11-16: 10:00:00 52 mL via OPHTHALMIC

## 2018-11-16 MED ORDER — MIDAZOLAM HCL 2 MG/2ML IJ SOLN
INTRAMUSCULAR | Status: DC | PRN
  Administered 2018-11-16 (×2): 1 mg via INTRAVENOUS

## 2018-11-16 MED ORDER — FENTANYL CITRATE (PF) 100 MCG/2ML IJ SOLN
INTRAMUSCULAR | Status: DC | PRN
  Administered 2018-11-16: 50 ug via INTRAVENOUS

## 2018-11-16 SURGICAL SUPPLY — 16 items
CANNULA ANT/CHMB 27G (MISCELLANEOUS) ×1 IMPLANT
CANNULA ANT/CHMB 27GA (MISCELLANEOUS) ×2 IMPLANT
GLOVE SURG LX 7.5 STRW (GLOVE) ×1
GLOVE SURG LX STRL 7.5 STRW (GLOVE) ×1 IMPLANT
GLOVE SURG TRIUMPH 8.0 PF LTX (GLOVE) ×2 IMPLANT
GOWN STRL REUS W/ TWL LRG LVL3 (GOWN DISPOSABLE) ×2 IMPLANT
GOWN STRL REUS W/TWL LRG LVL3 (GOWN DISPOSABLE) ×2
LENS IOL TECNIS ITEC 21.5 (Intraocular Lens) ×1 IMPLANT
MARKER SKIN DUAL TIP RULER LAB (MISCELLANEOUS) ×2 IMPLANT
PACK CATARACT BRASINGTON (MISCELLANEOUS) ×2 IMPLANT
PACK EYE AFTER SURG (MISCELLANEOUS) ×2 IMPLANT
PACK OPTHALMIC (MISCELLANEOUS) ×2 IMPLANT
SYR 3ML LL SCALE MARK (SYRINGE) ×2 IMPLANT
SYR TB 1ML LUER SLIP (SYRINGE) ×2 IMPLANT
WATER STERILE IRR 500ML POUR (IV SOLUTION) ×2 IMPLANT
WIPE NON LINTING 3.25X3.25 (MISCELLANEOUS) ×2 IMPLANT

## 2018-11-16 NOTE — Op Note (Signed)
OPERATIVE NOTE  Suzanne Strong [id number] 11/16/2018   PREOPERATIVE DIAGNOSIS:  Nuclear sclerotic cataract left eye. H25.12   POSTOPERATIVE DIAGNOSIS:    Nuclear sclerotic cataract left eye.     PROCEDURE:  Phacoemusification with posterior chamber intraocular lens placement of the left eye  Ultrasound time: Procedure(s) with comments: CATARACT EXTRACTION PHACO AND INTRAOCULAR LENS PLACEMENT (IOC) LEFT DIABETIC  01:01.0  17.0%  10.37 (Left) - Diabetic - oral meds Port-a-cath  LENS:   Implant Name Type Inv. Item Serial No. Manufacturer Lot No. LRB No. Used Action  LENS IOL DIOP 21.5 - [id number] Intraocular Lens LENS IOL DIOP 21.5 [id number]  Left 1 Implanted      SURGEON:  Wyonia Hough, MD   ANESTHESIA:  Topical with tetracaine drops and 2% Xylocaine jelly, augmented with 1% preservative-free intracameral lidocaine.    COMPLICATIONS:  None.   DESCRIPTION OF PROCEDURE:  The patient was identified in the holding room and transported to the operating room and placed in the supine position under the operating microscope.  The left eye was identified as the operative eye and it was prepped and draped in the usual sterile ophthalmic fashion.   A 1 millimeter clear-corneal paracentesis was made at the 1:30 position.  0.5 ml of preservative-free 1% lidocaine was injected into the anterior chamber.  The anterior chamber was filled with Viscoat viscoelastic.  A 2.4 millimeter keratome was used to make a near-clear corneal incision at the 10:30 position.  .  A curvilinear capsulorrhexis was made with a cystotome and capsulorrhexis forceps.  Balanced salt solution was used to hydrodissect and hydrodelineate the nucleus.   Phacoemulsification was then used in stop and chop fashion to remove the lens nucleus and epinucleus.  The remaining cortex was then removed using the irrigation and aspiration handpiece. Provisc was then placed into the capsular bag to distend it for lens  placement.  A lens was then injected into the capsular bag.  The remaining viscoelastic was aspirated.   Wounds were hydrated with balanced salt solution.  The anterior chamber was inflated to a physiologic pressure with balanced salt solution.  No wound leaks were noted. Cefuroxime 0.1 ml of a 10mg /ml solution was injected into the anterior chamber for a dose of 1 mg of intracameral antibiotic at the completion of the case.   Timolol and Brimonidine drops were applied to the eye.  The patient was taken to the recovery room in stable condition without complications of anesthesia or surgery.  [name] 11/16/2018, 10:03 AM

## 2018-11-16 NOTE — H&P (Signed)

## 2018-11-16 NOTE — Anesthesia Postprocedure Evaluation (Signed)
Anesthesia Post Note  Patient: Suzanne Strong  Procedure(s) Performed: CATARACT EXTRACTION PHACO AND INTRAOCULAR LENS PLACEMENT (IOC) LEFT DIABETIC  01:01.0  17.0%  10.37 (Left Eye)  Patient location during evaluation: PACU Anesthesia Type: MAC Level of consciousness: awake and alert and oriented Pain management: satisfactory to patient Vital Signs Assessment: post-procedure vital signs reviewed and stable Respiratory status: spontaneous breathing, nonlabored ventilation and respiratory function stable Cardiovascular status: blood pressure returned to baseline and stable Postop Assessment: Adequate PO intake and No signs of nausea or vomiting Anesthetic complications: no    Raliegh Ip

## 2018-11-16 NOTE — Transfer of Care (Signed)
Immediate Anesthesia Transfer of Care Note  Patient: Suzanne Strong  Procedure(s) Performed: CATARACT EXTRACTION PHACO AND INTRAOCULAR LENS PLACEMENT (IOC) LEFT DIABETIC  01:01.0  17.0%  10.37 (Left Eye)  Patient Location: PACU  Anesthesia Type: MAC  Level of Consciousness: awake, alert  and patient cooperative  Airway and Oxygen Therapy: Patient Spontanous Breathing and Patient connected to supplemental oxygen  Post-op Assessment: Post-op Vital signs reviewed, Patient's Cardiovascular Status Stable, Respiratory Function Stable, Patent Airway and No signs of Nausea or vomiting  Post-op Vital Signs: Reviewed and stable  Complications: No apparent anesthesia complications

## 2018-12-01 ENCOUNTER — Other Ambulatory Visit: Payer: Self-pay | Admitting: *Deleted

## 2018-12-01 MED ORDER — GABAPENTIN 300 MG PO CAPS: 900.0000 mg | ORAL_CAPSULE | Freq: Every day | ORAL | 0 refills | Status: DC

## 2018-12-19 DIAGNOSIS — Z961 Presence of intraocular lens: Secondary | ICD-10-CM | POA: Diagnosis not present

## 2019-01-13 ENCOUNTER — Other Ambulatory Visit: Payer: Self-pay | Admitting: Family Medicine

## 2019-01-20 ENCOUNTER — Other Ambulatory Visit: Payer: Self-pay | Admitting: Family Medicine

## 2019-01-25 DIAGNOSIS — E119 Type 2 diabetes mellitus without complications: Secondary | ICD-10-CM | POA: Diagnosis not present

## 2019-01-27 DIAGNOSIS — E119 Type 2 diabetes mellitus without complications: Secondary | ICD-10-CM | POA: Diagnosis not present

## 2019-01-30 ENCOUNTER — Encounter: Payer: Self-pay | Admitting: Internal Medicine

## 2019-02-01 ENCOUNTER — Other Ambulatory Visit: Payer: Self-pay

## 2019-02-01 ENCOUNTER — Inpatient Hospital Stay: Payer: PPO | Attending: Internal Medicine | Admitting: *Deleted

## 2019-02-01 ENCOUNTER — Other Ambulatory Visit: Payer: Self-pay | Admitting: *Deleted

## 2019-02-01 DIAGNOSIS — Z923 Personal history of irradiation: Secondary | ICD-10-CM | POA: Insufficient documentation

## 2019-02-01 DIAGNOSIS — Z171 Estrogen receptor negative status [ER-]: Secondary | ICD-10-CM | POA: Diagnosis not present

## 2019-02-01 DIAGNOSIS — C50412 Malignant neoplasm of upper-outer quadrant of left female breast: Secondary | ICD-10-CM | POA: Diagnosis not present

## 2019-02-01 DIAGNOSIS — Z9221 Personal history of antineoplastic chemotherapy: Secondary | ICD-10-CM | POA: Insufficient documentation

## 2019-02-01 DIAGNOSIS — Z85818 Personal history of malignant neoplasm of other sites of lip, oral cavity, and pharynx: Secondary | ICD-10-CM | POA: Insufficient documentation

## 2019-02-01 DIAGNOSIS — Z95828 Presence of other vascular implants and grafts: Secondary | ICD-10-CM

## 2019-02-01 LAB — CBC WITH DIFFERENTIAL/PLATELET
Abs Immature Granulocytes: 0.02 10*3/uL (ref 0.00–0.07)
Basophils Absolute: 0 10*3/uL (ref 0.0–0.1)
Basophils Relative: 0 %
Eosinophils Absolute: 0.2 10*3/uL (ref 0.0–0.5)
Eosinophils Relative: 3 %
HCT: 34.5 % — ABNORMAL LOW (ref 36.0–46.0)
Hemoglobin: 10.8 g/dL — ABNORMAL LOW (ref 12.0–15.0)
Immature Granulocytes: 0 %
Lymphocytes Relative: 19 %
Lymphs Abs: 0.9 10*3/uL (ref 0.7–4.0)
MCH: 28.9 pg (ref 26.0–34.0)
MCHC: 31.3 g/dL (ref 30.0–36.0)
MCV: 92.2 fL (ref 80.0–100.0)
Monocytes Absolute: 0.3 10*3/uL (ref 0.1–1.0)
Monocytes Relative: 7 %
Neutro Abs: 3.4 10*3/uL (ref 1.7–7.7)
Neutrophils Relative %: 71 %
Platelets: 176 10*3/uL (ref 150–400)
RBC: 3.74 MIL/uL — ABNORMAL LOW (ref 3.87–5.11)
RDW: 13.9 % (ref 11.5–15.5)
WBC: 4.8 10*3/uL (ref 4.0–10.5)
nRBC: 0 % (ref 0.0–0.2)

## 2019-02-01 LAB — COMPREHENSIVE METABOLIC PANEL
ALT: 21 U/L (ref 0–44)
AST: 22 U/L (ref 15–41)
Albumin: 3.5 g/dL (ref 3.5–5.0)
Alkaline Phosphatase: 80 U/L (ref 38–126)
Anion gap: 6 (ref 5–15)
BUN: 15 mg/dL (ref 8–23)
CO2: 30 mmol/L (ref 22–32)
Calcium: 8.9 mg/dL (ref 8.9–10.3)
Chloride: 103 mmol/L (ref 98–111)
Creatinine, Ser: 0.84 mg/dL (ref 0.44–1.00)
GFR calc Af Amer: >60 mL/min (ref >60)
GFR calc non Af Amer: >60 mL/min (ref >60)
Glucose, Bld: 181 mg/dL — ABNORMAL HIGH (ref 70–99)
Potassium: 4.5 mmol/L (ref 3.5–5.1)
Sodium: 139 mmol/L (ref 135–145)
Total Bilirubin: 0.4 mg/dL (ref 0.3–1.2)
Total Protein: 7.1 g/dL (ref 6.5–8.1)

## 2019-02-01 MED ORDER — SODIUM CHLORIDE 0.9% FLUSH
10.0000 mL | Freq: Once | INTRAVENOUS | Status: AC
  Administered 2019-02-01: 10 mL via INTRAVENOUS
  Filled 2019-02-01: qty 10

## 2019-02-01 MED ORDER — HEPARIN SOD (PORK) LOCK FLUSH 100 UNIT/ML IV SOLN
500.0000 [IU] | Freq: Once | INTRAVENOUS | Status: AC
  Administered 2019-02-01: 12:00:00 500 [IU] via INTRAVENOUS
  Filled 2019-02-01: qty 5

## 2019-02-02 LAB — CANCER ANTIGEN 27.29: CA 27.29: 9.5 U/mL (ref 0.0–38.6)

## 2019-02-08 ENCOUNTER — Other Ambulatory Visit: Payer: Self-pay | Admitting: General Surgery

## 2019-02-08 DIAGNOSIS — C50412 Malignant neoplasm of upper-outer quadrant of left female breast: Secondary | ICD-10-CM

## 2019-02-21 ENCOUNTER — Other Ambulatory Visit: Payer: Self-pay

## 2019-02-21 ENCOUNTER — Encounter: Payer: Self-pay | Admitting: Family Medicine

## 2019-02-21 ENCOUNTER — Ambulatory Visit (INDEPENDENT_AMBULATORY_CARE_PROVIDER_SITE_OTHER): Payer: PPO | Admitting: Family Medicine

## 2019-02-21 DIAGNOSIS — R05 Cough: Secondary | ICD-10-CM | POA: Diagnosis not present

## 2019-02-21 DIAGNOSIS — R059 Cough, unspecified: Secondary | ICD-10-CM | POA: Insufficient documentation

## 2019-02-21 DIAGNOSIS — R053 Chronic cough: Secondary | ICD-10-CM | POA: Insufficient documentation

## 2019-02-21 MED ORDER — HYDROCOD POLST-CPM POLST ER 10-8 MG/5ML PO SUER: 5.0000 mL | Freq: Two times a day (BID) | ORAL | 0 refills | Status: DC | PRN

## 2019-02-21 MED ORDER — ALBUTEROL SULFATE HFA 108 (90 BASE) MCG/ACT IN AERS: 2.0000 | INHALATION_SPRAY | Freq: Four times a day (QID) | RESPIRATORY_TRACT | 3 refills | Status: DC | PRN

## 2019-02-21 MED ORDER — PREDNISONE 10 MG PO TABS: ORAL_TABLET | ORAL | 0 refills | Status: DC

## 2019-02-21 NOTE — Assessment & Plan Note (Addendum)
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

## 2019-02-21 NOTE — Progress Notes (Signed)
Virtual Visit via Video Note  I connected with Suzanne Strong on 02/21/19 at 10:00 AM EST by a video enabled telemedicine application and verified that I am speaking with the correct person using two identifiers.  Location: Patient: home Provider: office    I discussed the limitations of evaluation and management by telemedicine and the availability of in person appointments. The patient expressed understanding and agreed to proceed.  Parties involved in encounter  Patient: Suzanne Strong  Provider:  [name] MD    History of Present Illness: Pt presents with uri symptoms   Has had a nagging cough for at least 3 months  (after a bout of bronchitis)  Gets worse at night time  Also worse when she drinks fluids   Non productive cough  occ wheezing sound /squeak  Happens only with a very hard cough  No chest tightness or sob   She tried an albuterol inhaler - then one otc when that ran out (bronchodilator)   Throat is sore in general (mask makes it worse)   No fever  No malaise   No pnd No runny nose or allergies No heartburn  Takes omeprazole    Has been tested for covid 3 times - all neg  No exposures Being very careful - working from home    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 Cough 02/21/2019  . Fever 09/01/2018  . Pre-syncope 02/07/2018  . Orthostatic hypotension 01/26/2018  . Episodic weakness 01/26/2018  . Medicare annual wellness visit, subsequent 06/29/2017  . Preoperative examination 04/27/2017  . Carpal tunnel syndrome of right wrist 11/13/2016  . Obstructive sleep apnea 01/01/2016  . Carcinoma of  upper-outer quadrant of left breast in female, estrogen receptor negative ([place]) 11/21/2015  . Hypomagnesemia 08/28/2015  . Initial Medicare annual wellness visit 10/16/2014  . Estrogen deficiency 10/16/2014  . Colon cancer screening 10/16/2014  . Controlled type 2 diabetes mellitus without complication ([place]) [id number]  . RLS (restless legs syndrome) 02/21/2014  . Chronic neck pain 07/25/2010  . Depression 07/07/2010  . Routine general medical examination at a health care facility 06/26/2010  . B12 deficiency 09/04/2009  . Anemia, iron deficiency 08/29/2009  . Anxiety state 08/26/2009  . GASTROPARESIS 08/26/2009  . Vitamin D deficiency 07/19/2008  . Disorder of bone and cartilage 07/19/2007  . Hyperlipidemia 07/18/2007  . EDEMA 07/18/2007  . Skin lesion, superficial 06/02/2007   Past Medical History:  Diagnosis Date  . Anemia    iron deficiency and B12 deficiency  . Anxiety   . Breast cancer ([place]) 02/25/2015   upper-outer quadrant of left female breast, Triple Negative. Neo-adjuvant chemo with complete pathologic response.   . Cervical dysplasia    conization  . Diabetes mellitus without complication ([place])   . Diverticulosis   . Fever 04/11/2015  . Gastroparesis    related to previous radiation tx  . GERD (gastroesophageal reflux disease)   . Hyperlipidemia   . Hypertension   . Neuropathy    legs/feet, S/P chemo meds  . Personal history of radiation therapy 2017  LEFT lumpectomy w/ radiation  . RLS (restless legs syndrome)   . Shingles    Hx of  . 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 Wears dentures    partial bottom   Past Surgical History:  Procedure Laterality Date  . APPENDECTOMY    . BREAST BIOPSY Left 02/25/2015   INVASIVE MAMMARY CARCINOMA,Triple negative.  [name] BREAST CYST ASPIRATION    . BREAST EXCISIONAL BIOPSY Left 12/2015   surgery  . BREAST LUMPECTOMY WITH NEEDLE LOCALIZATION Left 10/02/2015   Procedure: BREAST LUMPECTOMY WITH NEEDLE LOCALIZATION;  Surgeon:  [name], MD;  Location: [facility];  Service: General;  Laterality: Left;  . BREAST LUMPECTOMY WITH SENTINEL LYMPH NODE BIOPSY Left 10/02/2015   Procedure: LEFT BREAST WIDE EXCISION WITH SENTINEL LYMPH NODE BX;  Surgeon: [name], MD;  Location: [facility];  Service: General;  Laterality: Left;  . BREAST SURGERY     breast biopsy benign  . CARPAL TUNNEL RELEASE Right 05/19/2017   Procedure: CARPAL TUNNEL RELEASE;  Surgeon: Earnestine Leys, MD;  Location: [facility];  Service: Orthopedics;  Laterality: Right;  . CATARACT EXTRACTION W/PHACO Right 10/26/2018   Procedure: CATARACT EXTRACTION PHACO AND INTRAOCULAR LENS PLACEMENT ([place]) right diabetic;  Surgeon: Leandrew Koyanagi, MD;  Location: [place];  Service: Ophthalmology;  Laterality: Right;  Diabetic - oral meds cancer center been notified and will come  . CATARACT EXTRACTION W/PHACO Left 11/16/2018   Procedure: CATARACT EXTRACTION PHACO AND INTRAOCULAR LENS PLACEMENT (IOC) LEFT DIABETIC  01:01.0  17.0%  10.37;  Surgeon: Leandrew Koyanagi, MD;  Location: [place];  Service: Ophthalmology;  Laterality: Left;  Diabetic - oral meds Port-a-cath  . CHOLECYSTECTOMY    . Cologuard  07/22/2016   Negative  . ESOPHAGOGASTRODUODENOSCOPY  07/2009   normal with few gastric polyps  . MOLE REMOVAL    . PORT-A-CATH REMOVAL    . PORTACATH PLACEMENT    . PORTACATH PLACEMENT Right 03/12/2015   Procedure: INSERTION PORT-A-CATH;  Surgeon: [name], MD;  Location: [facility];  Service: General;  Laterality: Right;  . RADICAL NECK DISSECTION    . TONSILLECTOMY     cancer treated with chemo and radiation  . TRIGGER FINGER RELEASE Right 05/19/2017   Procedure: RELEASE TRIGGER FINGER/A-1 PULLEY ring finger;  Surgeon: Earnestine Leys, MD;  Location: [facility];  Service: Orthopedics;  Laterality: Right;   Social History   Tobacco Use  . Smoking status: Former Smoker    Packs/day: 0.50    Years: 6.00    Pack years: 3.00     Types: Cigarettes    Quit date: 03/10/1984    Years since quitting: 34.9  . Smokeless tobacco: Never Used  Substance Use Topics  . Alcohol use: No    Alcohol/week: 0.0 standard drinks  . Drug use: No   Family History  Problem Relation Age of Onset  . Osteoporosis Mother   . Hyperlipidemia Mother   . Depression Mother   . Cancer Mother        skin cancer ? basal cell and lung ca heavy smoker  . Alcohol abuse Father   . Cancer Father        skin CA ? basal cell  . Heart disease Father        CHF  . Hyperlipidemia Brother   . Hypertension Brother   . Breast cancer Neg Hx    Allergies  Allergen Reactions  . Amoxicillin Swelling    REACTION: rash, swelling Has patient had a  PCN reaction causing immediate rash, facial/tongue/throat swelling, SOB or lightheadedness with hypotension: yes Has patient had a PCN reaction causing severe rash involving mucus membranes or skin necrosis: no Has patient had a PCN reaction that required hospitalization: no Has patient had a PCN reaction occurring within the last 10 years: yes If all of the above answers are "NO", then may proceed with Cephalosporin use.   [name] Fluconazole Rash    REACTION: hives  . Difuleron [Ferrous Fumarate-Dss]   . 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 ALPRAZolam (XANAX) 0.5 MG tablet Take 1 tablet (0.5 mg total) by mouth 2 (two) times daily as needed for anxiety or sleep. 60 tablet 5  . amitriptyline (ELAVIL) 50 MG tablet Take 1 tablet (50 mg total) by mouth at bedtime. 90 tablet 3  . atorvastatin (LIPITOR) 10 MG tablet TAKE 1 TABLET(10 MG) BY MOUTH DAILY 90 tablet 1  . b complex vitamins tablet Take 1 tablet by mouth daily.      . Cholecalciferol (VITAMIN D-1000 MAX ST) 25 MCG (1000 UT) tablet Take 4,000 Units by mouth daily.    [name] FERROUS SULFATE PO Take by  mouth.    . gabapentin (NEURONTIN) 300 MG capsule Take 3 capsules (900 mg total) by mouth at bedtime. 270 capsule 0  . glipiZIDE (GLUCOTROL) 10 MG tablet Take 10 mg by mouth 2 (two) times daily before a meal.     . JANUMET XR 50-1000 MG TB24 Take 1 tablet by mouth 2 (two) times daily.   0  . lidocaine-prilocaine (EMLA) cream Apply 1 application topically as needed. Apply to port when going through Chemo    . MELOXICAM PO Take by mouth as needed.    . Multiple Vitamins-Minerals (WOMENS MULTIVITAMIN PO) Take by mouth.    [name] omeprazole (PRILOSEC) 20 MG capsule TAKE 1 CAPSULE(20 MG) BY MOUTH AT BEDTIME 90 capsule 1  . pregabalin (LYRICA) 150 MG capsule Take 1 capsule by mouth 2 (two) times a day.    [name] rOPINIRole (REQUIP) 1 MG tablet TAKE 3 TABLETS BY MOUTH AT BEDTIME 90 tablet 5  . sertraline (ZOLOFT) 100 MG tablet Use 2 a day 180 tablet 1  . traZODone (DESYREL) 100 MG tablet Take 1/2-1 tablet po QHS prn insomnia 90 tablet 1   Current Facility-Administered Medications on File Prior to Visit  Medication Dose Route Frequency Provider Last Rate Last Admin  . sodium chloride flush (NS) 0.9 % injection 10 mL  10 mL Intravenous PRN Cammie Sickle, MD   10 mL at 09/11/15 0845   Review of Systems  Constitutional: Negative for chills, fever and malaise/fatigue.  HENT: Positive for sore throat. Negative for congestion, ear pain and sinus pain.   Eyes: Negative for blurred vision, discharge and redness.  Respiratory: Positive for cough and wheezing. Negative for hemoptysis, sputum production, shortness of breath and stridor.   Cardiovascular: Negative for chest pain, palpitations and leg swelling.  Gastrointestinal: Negative for abdominal pain, diarrhea, nausea and vomiting.  Musculoskeletal: Negative for myalgias.  Skin: Negative for rash.  Neurological: Negative for dizziness and headaches.    Observations/Objective: Patient appears well, in no distress Weight is baseline  No facial swelling  or asymmetry Normal voice-not hoarse and no slurred speech No obvious tremor or mobility impairment Moving neck and UEs normally Able to  hear the call well  No  shortness of breath during interview  Cough is dry with a tight/wheezy component to it  Talkative and mentally sharp with no cognitive changes No skin changes on face or neck , no rash or pallor Affect is normal    Assessment and Plan: Problem List Items Addressed This Visit      Other   Cough    Post viral cough syndrome (negative covid tests), with some ST in pt with 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 The patient was provided an opportunity to ask questions and all were answered. The patient agreed with the plan and demonstrated an understanding of the instructions.   The patient was advised to call back or seek an in-person evaluation if the symptoms worsen or if the condition fails to improve as anticipated.    [name], MD

## 2019-02-21 NOTE — Patient Instructions (Signed)
Take prednisone taper as directed  It will make glucose levels go up- be aware  Use albuterol inhaler as needed for wheeze/cough  Also tussionex for cough- with caution of sedation   Update if not starting to improve in a week or if worsening

## 2019-02-27 ENCOUNTER — Other Ambulatory Visit: Payer: Self-pay | Admitting: *Deleted

## 2019-02-27 MED ORDER — GABAPENTIN 300 MG PO CAPS: 900.0000 mg | ORAL_CAPSULE | Freq: Every day | ORAL | 0 refills | Status: DC

## 2019-03-08 ENCOUNTER — Encounter: Payer: Self-pay | Admitting: Radiation Oncology

## 2019-03-08 ENCOUNTER — Other Ambulatory Visit: Payer: Self-pay | Admitting: Psychiatry

## 2019-03-08 DIAGNOSIS — F411 Generalized anxiety disorder: Secondary | ICD-10-CM

## 2019-03-19 ENCOUNTER — Other Ambulatory Visit: Payer: Self-pay | Admitting: Psychiatry

## 2019-03-19 DIAGNOSIS — G4700 Insomnia, unspecified: Secondary | ICD-10-CM

## 2019-03-23 ENCOUNTER — Encounter: Payer: Self-pay | Admitting: Psychiatry

## 2019-03-23 ENCOUNTER — Ambulatory Visit (INDEPENDENT_AMBULATORY_CARE_PROVIDER_SITE_OTHER): Payer: PPO | Admitting: Psychiatry

## 2019-03-23 DIAGNOSIS — G4700 Insomnia, unspecified: Secondary | ICD-10-CM

## 2019-03-23 DIAGNOSIS — F411 Generalized anxiety disorder: Secondary | ICD-10-CM | POA: Diagnosis not present

## 2019-03-23 MED ORDER — SERTRALINE HCL 100 MG PO TABS: ORAL_TABLET | ORAL | 1 refills | Status: DC

## 2019-03-23 MED ORDER — TRAZODONE HCL 100 MG PO TABS: ORAL_TABLET | ORAL | 1 refills | Status: DC

## 2019-03-23 MED ORDER — ALPRAZOLAM 0.5 MG PO TABS: 0.5000 mg | ORAL_TABLET | Freq: Two times a day (BID) | ORAL | 5 refills | Status: DC | PRN

## 2019-03-23 NOTE — Progress Notes (Signed)
Suzanne Strong VN:[id number] 09-11-1945 74 y.o.  Virtual Visit via Telephone Note  I connected with pt on 03/23/19 at 10:00 AM EST by telephone and verified that I am speaking with the correct person using two identifiers.   I discussed the limitations, risks, security and privacy concerns of performing an evaluation and management service by telephone and the availability of in person appointments. I also discussed with the patient that there may be a patient responsible charge related to this service. The patient expressed understanding and agreed to proceed.   I discussed the assessment and treatment plan with the patient. The patient was provided an opportunity to ask questions and all were answered. The patient agreed with the plan and demonstrated an understanding of the instructions.   The patient was advised to call back or seek an in-person evaluation if the symptoms worsen or if the condition fails to improve as anticipated.  I provided 30 minutes of non-face-to-face time during this encounter.  The patient was located at home.  The provider was located at [place].   Thayer Headings, PMHNP   Subjective:   Patient ID:  Suzanne Strong is a 74 y.o. (DOB [birth year]) female.  Chief Complaint:  Chief Complaint  Patient presents with  . Follow-up    h/o anxiety and insomina    HPI Suzanne Strong presents for follow-up of anxiety and insomnia. She reports that her mood and anxiety has been well controlled overall. Denies any recent anxiety and depression. She reports that her mood has been good. She reports that she has been sleeping well. Reports very few middle of the night awakenings. Reports that RLS and neuropathy remain well controlled. Energy has been good overall. Motivation has been good. Appetite has been good. Concentration has not been impaired. Denies SI.  Reports that she usually will take Xanax one tab at bedtime and another as needed on  occasion.   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   Past Psychiatric Medication Trials: Remeron Xanax Gabapentin- Unsure if this is helpful for RLS Ropinorole- Helpful for RLS Zoloft Trazodone  Review of Systems:  Review of Systems  HENT: Positive for dental problem.        Has upcoming oral surgery   Eyes: Negative for visual disturbance.       Reports improved vision after cataract surgery  Musculoskeletal: Negative for gait problem.  Neurological: Negative for tremors.  Psychiatric/Behavioral:       Please refer to HPI    Medications: I have reviewed the patient's current medications.  Current Outpatient Medications  Medication Sig Dispense Refill  . albuterol (VENTOLIN HFA) 108 (90 Base) MCG/ACT inhaler Inhale 2 puffs into the lungs every 6 (six) hours as needed for wheezing or shortness of breath (tight chest). 18 g 3  . ALPRAZolam (XANAX) 0.5 MG tablet Take 1 tablet (0.5 mg total) by mouth 2 (two) times daily as needed for anxiety or sleep. 60 tablet 5  . amitriptyline (ELAVIL) 50 MG tablet Take 1 tablet (50 mg total) by mouth at bedtime. 90 tablet 3  . atorvastatin (LIPITOR) 10 MG tablet TAKE 1 TABLET(10 MG) BY MOUTH DAILY 90 tablet 1  . b complex vitamins tablet Take 1 tablet by mouth daily.      . Cholecalciferol (VITAMIN D-1000 MAX ST) 25 MCG (1000 UT) tablet Take 4,000 Units by mouth daily.    [name] FERROUS SULFATE PO Take by mouth.    . gabapentin (NEURONTIN) 300 MG capsule Take  3 capsules (900 mg total) by mouth at bedtime. 270 capsule 0  . glipiZIDE (GLUCOTROL) 10 MG tablet Take 10 mg by mouth 2 (two) times daily before a meal.     . JANUMET XR 50-1000 MG TB24 Take 1 tablet by mouth 2 (two) times daily.   0  . MELOXICAM PO Take by mouth as needed.    . Multiple Vitamins-Minerals (WOMENS MULTIVITAMIN PO) Take by mouth.    [name] omeprazole (PRILOSEC) 20 MG capsule TAKE 1 CAPSULE(20 MG) BY MOUTH AT BEDTIME 90 capsule 1  . rOPINIRole (REQUIP) 1 MG tablet TAKE 3  TABLETS BY MOUTH AT BEDTIME 90 tablet 5  . sertraline (ZOLOFT) 100 MG tablet TAKE 2 TABLETS BY MOUTH EVERY DAY 180 tablet 1  . traZODone (DESYREL) 100 MG tablet TAKE 1/2 TO 1 (ONE-HALF TO ONE) TABLET BY MOUTH EVERY DAY AT BEDTIME AS NEEDED FOR INSOMNIA 90 tablet 1  . chlorpheniramine-HYDROcodone (TUSSIONEX PENNKINETIC ER) 10-8 MG/5ML SUER Take 5 mLs by mouth every 12 (twelve) hours as needed for cough. Caution of sedation and habit 80 mL 0  . lidocaine-prilocaine (EMLA) cream Apply 1 application topically as needed. Apply to port when going through Chemo    . predniSONE (DELTASONE) 10 MG tablet Take 3 pills once daily by mouth for 3 days, then 2 pills once daily for 3 days, then 1 pill once daily for 3 days and then stop (Patient not taking: Reported on 03/23/2019) 18 tablet 0  . pregabalin (LYRICA) 150 MG capsule Take 1 capsule by mouth 2 (two) times a day.     No current facility-administered medications for this visit.   Facility-Administered Medications Ordered in Other Visits  Medication Dose Route Frequency Provider Last Rate Last Admin  . sodium chloride flush (NS) 0.9 % injection 10 mL  10 mL Intravenous PRN Cammie Sickle, MD   10 mL at 09/11/15 0845    Medication Side Effects: None  Allergies:  Allergies  Allergen Reactions  . Amoxicillin Swelling    REACTION: rash, swelling Has patient had a PCN reaction causing immediate rash, facial/tongue/throat swelling, SOB or lightheadedness with hypotension: yes Has patient had a PCN reaction causing severe rash involving mucus membranes or skin necrosis: no Has patient had a PCN reaction that required hospitalization: no Has patient had a PCN reaction occurring within the last 10 years: yes If all of the above answers are "NO", then may proceed with Cephalosporin use.   [name] Fluconazole Rash    REACTION: hives  . Difuleron [Ferrous Fumarate-Dss]   . 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 Anemia    iron deficiency and B12 deficiency  . Anxiety   . Breast cancer ([place]) 02/25/2015   upper-outer quadrant of left female breast, Triple Negative. Neo-adjuvant chemo with complete pathologic response.   . Cervical dysplasia    conization  . Diabetes mellitus without complication ([place])   . Diverticulosis   . Fever 04/11/2015  . Gastroparesis    related to previous radiation tx  . GERD (gastroesophageal reflux disease)   . Hyperlipidemia   . Hypertension   . Neuropathy    legs/feet, S/P chemo meds  . Personal history of radiation therapy 2017   LEFT lumpectomy w/ radiation  . RLS (restless legs syndrome)   . Shingles    Hx of  . 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 Wears dentures    partial bottom    Family History  Problem Relation Age of Onset  . Osteoporosis Mother   . Hyperlipidemia Mother   . Depression Mother   . Cancer Mother        skin cancer ? basal cell and lung ca heavy smoker  . Alcohol abuse Father   . Cancer Father        skin CA ? basal cell  . Heart disease Father        CHF  . Hyperlipidemia Brother   . Hypertension Brother   . Breast cancer Neg Hx     Social History   Socioeconomic History  . Marital status: Married    Spouse name: Not on file  . Number of children: 0  . Years of education: 24  . Highest education level: Not on file  Occupational History  . Occupation: Clinical cytogeneticist  Tobacco Use  . Smoking status: Former Smoker    Packs/day: 0.50    Years: 6.00    Pack years: 3.00    Types: Cigarettes    Quit date: 03/10/1984    Years since quitting: 35.0  . Smokeless tobacco: Never Used  Substance and Sexual Activity  . Alcohol use: No    Alcohol/week: 0.0 standard drinks  . Drug use: No  . Sexual activity: Not Currently  Other Topics Concern  . Not on file  Social History Narrative   Lives with husband in a one story home.  No children.        Works as  Radiation protection practitioner for [employer].        Education: college.    Social Determinants of Health   Financial Resource Strain:   . Difficulty of Paying Living Expenses: Not on file  Food Insecurity:   . Worried About Charity fundraiser in the Last Year: Not on file  . Ran Out of Food in the Last Year: Not on file  Transportation Needs:   . Lack of Transportation (Medical): Not on file  . Lack of Transportation (Non-Medical): Not on file  Physical Activity:   . Days of Exercise per Week: Not on file  . Minutes of Exercise per Session: Not on file  Stress:   . Feeling of Stress : Not on file  Social Connections:   . Frequency of Communication with Friends and Family: Not on file  . Frequency of Social Gatherings with Friends and Family: Not on file  . Attends Religious Services: Not on file  . Active Member of Clubs or Organizations: Not on file  . Attends Archivist Meetings: Not on file  . Marital Status: Not on file  Intimate Partner Violence:   . Fear of Current or Ex-Partner: Not on file  . Emotionally Abused: Not on file  . Physically Abused: Not on file  . Sexually Abused: Not on file    Past Medical History, Surgical history, Social history, and Family history were reviewed and updated as appropriate.   Please see review of systems for further details on the patient's review from today.   Objective:   Physical Exam:  BP 140/75   Wt 212 lb (96.2 kg)   BMI 34.22 kg/m   Physical Exam Neurological:     Mental Status: She is alert and oriented to person, place, and time.     Cranial Nerves: No dysarthria.  Psychiatric:        Attention and Perception: Attention and  perception normal.        Mood and Affect: Mood normal.        Speech: Speech normal.        Behavior: Behavior is cooperative.        Thought Content: Thought content normal. Thought content is not paranoid or delusional. Thought content does not include homicidal or suicidal ideation. Thought  content does not include homicidal or suicidal plan.        Cognition and Memory: Cognition and memory normal.        Judgment: Judgment normal.     Comments: Insight intact     Lab Review:     Component Value Date/Time   NA 139 02/01/2019 1131   NA 142 03/01/2014 0846   K 4.5 02/01/2019 1131   K 4.3 03/01/2014 0846   CL 103 02/01/2019 1131   CL 102 03/01/2014 0846   CO2 30 02/01/2019 1131   CO2 28 03/01/2014 0846   GLUCOSE 181 (H) 02/01/2019 1131   GLUCOSE 219 (H) 03/01/2014 0846   BUN 15 02/01/2019 1131   BUN 12 03/01/2014 0846   CREATININE 0.84 02/01/2019 1131   CREATININE 0.95 03/01/2014 0846   CALCIUM 8.9 02/01/2019 1131   CALCIUM 8.6 03/01/2014 0846   PROT 7.1 02/01/2019 1131   PROT 7.5 03/01/2014 0846   ALBUMIN 3.5 02/01/2019 1131   ALBUMIN 3.4 03/01/2014 0846   AST 22 02/01/2019 1131   AST 20 03/01/2014 0846   ALT 21 02/01/2019 1131   ALT 28 03/01/2014 0846   ALKPHOS 80 02/01/2019 1131   ALKPHOS 122 (H) 03/01/2014 0846   BILITOT 0.4 02/01/2019 1131   BILITOT 0.2 03/01/2014 0846   GFRNONAA >60 02/01/2019 1131   GFRNONAA >60 03/01/2014 0846   GFRNONAA >60 08/24/2013 1015   GFRAA >60 02/01/2019 1131   GFRAA >60 03/01/2014 0846   GFRAA >60 08/24/2013 1015       Component Value Date/Time   WBC 4.8 02/01/2019 1131   RBC 3.74 (L) 02/01/2019 1131   HGB 10.8 (L) 02/01/2019 1131   HGB 11.5 (L) 03/01/2014 0846   HCT 34.5 (L) 02/01/2019 1131   HCT 36.4 03/01/2014 0846   PLT 176 02/01/2019 1131   PLT 254 03/01/2014 0846   MCV 92.2 02/01/2019 1131   MCV 81 03/01/2014 0846   MCH 28.9 02/01/2019 1131   MCHC 31.3 02/01/2019 1131   RDW 13.9 02/01/2019 1131   RDW 15.6 (H) 03/01/2014 0846   LYMPHSABS 0.9 02/01/2019 1131   LYMPHSABS 1.0 03/01/2014 0846   MONOABS 0.3 02/01/2019 1131   MONOABS 0.3 03/01/2014 0846   EOSABS 0.2 02/01/2019 1131   EOSABS 0.1 03/01/2014 0846   BASOSABS 0.0 02/01/2019 1131   BASOSABS 0.0 03/01/2014 0846    No results found for:  POCLITH, LITHIUM   No results found for: PHENYTOIN, PHENOBARB, VALPROATE, CBMZ   .res Assessment: Plan:   Will continue current plan of care since target signs and symptoms are well controlled without any tolerability issues. Continue sertraline 200 mg daily for anxiety. Continue alprazolam 0.5 mg p.o. twice daily as needed anxiety. Continue trazodone 100 mg 1/2-1 tab nightly as needed insomnia. Patient to follow-up in 6 months or sooner if clinically indicated. Patient advised to contact office with any questions, adverse effects, or acute worsening in signs and symptoms.  Tylie was seen today for follow-up.  Diagnoses and all orders for this visit:  Insomnia, unspecified type -     traZODone (DESYREL) 100 MG tablet; TAKE  1/2 TO 1 (ONE-HALF TO ONE) TABLET BY MOUTH EVERY DAY AT BEDTIME AS NEEDED FOR INSOMNIA  Anxiety state -     sertraline (ZOLOFT) 100 MG tablet; TAKE 2 TABLETS BY MOUTH EVERY DAY -     ALPRAZolam (XANAX) 0.5 MG tablet; Take 1 tablet (0.5 mg total) by mouth 2 (two) times daily as needed for anxiety or sleep.    Please see After Visit Summary for patient specific instructions.  Future Appointments  Date Time Provider [place]  04/10/2019  1:40 PM [facility]  04/10/2019  2:00 PM [facility] [place] [facility]  04/10/2019  2:10 PM [facility] [place] [facility]  04/20/2019 10:30 AM [facility] LAB [facility] None  04/20/2019 11:00 AM Cammie Sickle, MD [facility] None  05/03/2019  2:00 PM Noreene Filbert, MD [facility] None  09/20/2019 10:00 AM Thayer Headings, PMHNP [facility] None    No orders of the defined types were placed in this encounter.     -------------------------------

## 2019-04-10 ENCOUNTER — Ambulatory Visit
Admission: RE | Admit: 2019-04-10 | Discharge: 2019-04-10 | Disposition: A | Discharge status: Home / Self Care | Payer: PPO | Source: Ambulatory Visit | Attending: General Surgery | Admitting: General Surgery

## 2019-04-10 DIAGNOSIS — Z171 Estrogen receptor negative status [ER-]: Secondary | ICD-10-CM | POA: Diagnosis not present

## 2019-04-10 DIAGNOSIS — C50412 Malignant neoplasm of upper-outer quadrant of left female breast: Secondary | ICD-10-CM | POA: Diagnosis not present

## 2019-04-10 DIAGNOSIS — R928 Other abnormal and inconclusive findings on diagnostic imaging of breast: Secondary | ICD-10-CM | POA: Diagnosis not present

## 2019-04-10 HISTORY — DX: Personal history of antineoplastic chemotherapy: Z92.21

## 2019-04-18 ENCOUNTER — Telehealth: Payer: Self-pay | Admitting: Family Medicine

## 2019-04-18 ENCOUNTER — Telehealth: Payer: Self-pay | Admitting: Psychiatry

## 2019-04-18 DIAGNOSIS — Z853 Personal history of malignant neoplasm of breast: Secondary | ICD-10-CM | POA: Diagnosis not present

## 2019-04-18 NOTE — Chronic Care Management (AMB) (Signed)
Chronic Care Management   Note  04/18/2019 Name: NITI LEISURE MRN: [id number] DOB: 02/01/1946  [name] is a 74 y.o. year old female who is a primary care patient of Tower, [name], MD. I reached out to [name] by phone today in response to a referral sent by Ms. Bosie Helper Moorman's PCP, Tower, [name], MD.   Ms. Ofarrell was given information about Chronic Care Management services today including:  1. CCM service includes personalized support from designated clinical staff supervised by her physician, including individualized plan of care and coordination with other care providers 2. 24/7 contact phone numbers for assistance for urgent and routine care needs. 3. Service will only be billed when office clinical staff spend 20 minutes or more in a month to coordinate care. 4. Only one practitioner may furnish and bill the service in a calendar month. 5. The patient may stop CCM services at any time (effective at the end of the month) by phone call to the office staff. 6. The patient will be responsible for cost sharing (co-pay) of up to 20% of the service fee (after annual deductible is met).  Patient agreed to services and verbal consent obtained.   Follow up plan:   Raynicia Dukes [employer] Scheduler

## 2019-04-18 NOTE — Telephone Encounter (Signed)
Patient called and said that her husband passed away suddenly last 2022/05/31. She is not sleeping at all. Please call her with suggestions to help her sleep. Her number is [phone]

## 2019-04-20 ENCOUNTER — Inpatient Hospital Stay (HOSPITAL_BASED_OUTPATIENT_CLINIC_OR_DEPARTMENT_OTHER): Payer: PPO | Admitting: Internal Medicine

## 2019-04-20 ENCOUNTER — Inpatient Hospital Stay: Payer: PPO | Attending: Internal Medicine

## 2019-04-20 ENCOUNTER — Encounter: Payer: Self-pay | Admitting: Internal Medicine

## 2019-04-20 ENCOUNTER — Other Ambulatory Visit: Payer: Self-pay

## 2019-04-20 ENCOUNTER — Other Ambulatory Visit: Payer: Self-pay | Admitting: *Deleted

## 2019-04-20 ENCOUNTER — Other Ambulatory Visit: Payer: Self-pay | Admitting: Family Medicine

## 2019-04-20 VITALS — BP 128/76 | HR 87 | Temp 96.5°F | Wt 227.0 lb

## 2019-04-20 DIAGNOSIS — Z95828 Presence of other vascular implants and grafts: Secondary | ICD-10-CM

## 2019-04-20 DIAGNOSIS — F419 Anxiety disorder, unspecified: Secondary | ICD-10-CM | POA: Diagnosis not present

## 2019-04-20 DIAGNOSIS — Z9221 Personal history of antineoplastic chemotherapy: Secondary | ICD-10-CM | POA: Insufficient documentation

## 2019-04-20 DIAGNOSIS — Z7984 Long term (current) use of oral hypoglycemic drugs: Secondary | ICD-10-CM | POA: Diagnosis not present

## 2019-04-20 DIAGNOSIS — C50412 Malignant neoplasm of upper-outer quadrant of left female breast: Secondary | ICD-10-CM | POA: Diagnosis not present

## 2019-04-20 DIAGNOSIS — Z87891 Personal history of nicotine dependence: Secondary | ICD-10-CM | POA: Insufficient documentation

## 2019-04-20 DIAGNOSIS — Z13820 Encounter for screening for osteoporosis: Secondary | ICD-10-CM | POA: Diagnosis not present

## 2019-04-20 DIAGNOSIS — I10 Essential (primary) hypertension: Secondary | ICD-10-CM | POA: Insufficient documentation

## 2019-04-20 DIAGNOSIS — D649 Anemia, unspecified: Secondary | ICD-10-CM | POA: Insufficient documentation

## 2019-04-20 DIAGNOSIS — E119 Type 2 diabetes mellitus without complications: Secondary | ICD-10-CM | POA: Insufficient documentation

## 2019-04-20 DIAGNOSIS — Z7952 Long term (current) use of systemic steroids: Secondary | ICD-10-CM | POA: Diagnosis not present

## 2019-04-20 DIAGNOSIS — K219 Gastro-esophageal reflux disease without esophagitis: Secondary | ICD-10-CM | POA: Diagnosis not present

## 2019-04-20 DIAGNOSIS — Z171 Estrogen receptor negative status [ER-]: Secondary | ICD-10-CM

## 2019-04-20 DIAGNOSIS — Z79899 Other long term (current) drug therapy: Secondary | ICD-10-CM | POA: Diagnosis not present

## 2019-04-20 DIAGNOSIS — Z923 Personal history of irradiation: Secondary | ICD-10-CM | POA: Insufficient documentation

## 2019-04-20 DIAGNOSIS — E785 Hyperlipidemia, unspecified: Secondary | ICD-10-CM | POA: Diagnosis not present

## 2019-04-20 DIAGNOSIS — Z791 Long term (current) use of non-steroidal anti-inflammatories (NSAID): Secondary | ICD-10-CM | POA: Insufficient documentation

## 2019-04-20 DIAGNOSIS — Z853 Personal history of malignant neoplasm of breast: Secondary | ICD-10-CM | POA: Insufficient documentation

## 2019-04-20 DIAGNOSIS — Z85818 Personal history of malignant neoplasm of other sites of lip, oral cavity, and pharynx: Secondary | ICD-10-CM | POA: Diagnosis not present

## 2019-04-20 LAB — CBC WITH DIFFERENTIAL/PLATELET
Abs Immature Granulocytes: 0.01 10*3/uL (ref 0.00–0.07)
Basophils Absolute: 0 10*3/uL (ref 0.0–0.1)
Basophils Relative: 0 %
Eosinophils Absolute: 0.3 10*3/uL (ref 0.0–0.5)
Eosinophils Relative: 6 %
HCT: 33.4 % — ABNORMAL LOW (ref 36.0–46.0)
Hemoglobin: 10.3 g/dL — ABNORMAL LOW (ref 12.0–15.0)
Immature Granulocytes: 0 %
Lymphocytes Relative: 19 %
Lymphs Abs: 1 10*3/uL (ref 0.7–4.0)
MCH: 28.1 pg (ref 26.0–34.0)
MCHC: 30.8 g/dL (ref 30.0–36.0)
MCV: 91 fL (ref 80.0–100.0)
Monocytes Absolute: 0.4 10*3/uL (ref 0.1–1.0)
Monocytes Relative: 7 %
Neutro Abs: 3.5 10*3/uL (ref 1.7–7.7)
Neutrophils Relative %: 68 %
Platelets: 184 10*3/uL (ref 150–400)
RBC: 3.67 MIL/uL — ABNORMAL LOW (ref 3.87–5.11)
RDW: 14.3 % (ref 11.5–15.5)
WBC: 5.1 10*3/uL (ref 4.0–10.5)
nRBC: 0 % (ref 0.0–0.2)

## 2019-04-20 LAB — COMPREHENSIVE METABOLIC PANEL
ALT: 22 U/L (ref 0–44)
AST: 28 U/L (ref 15–41)
Albumin: 3.6 g/dL (ref 3.5–5.0)
Alkaline Phosphatase: 88 U/L (ref 38–126)
Anion gap: 10 (ref 5–15)
BUN: 15 mg/dL (ref 8–23)
CO2: 26 mmol/L (ref 22–32)
Calcium: 8.8 mg/dL — ABNORMAL LOW (ref 8.9–10.3)
Chloride: 102 mmol/L (ref 98–111)
Creatinine, Ser: 0.84 mg/dL (ref 0.44–1.00)
GFR calc Af Amer: >60 mL/min (ref >60)
GFR calc non Af Amer: >60 mL/min (ref >60)
Glucose, Bld: 110 mg/dL — ABNORMAL HIGH (ref 70–99)
Potassium: 4.1 mmol/L (ref 3.5–5.1)
Sodium: 138 mmol/L (ref 135–145)
Total Bilirubin: 0.3 mg/dL (ref 0.3–1.2)
Total Protein: 7 g/dL (ref 6.5–8.1)

## 2019-04-20 MED ORDER — SODIUM CHLORIDE 0.9% FLUSH
10.0000 mL | Freq: Once | INTRAVENOUS | Status: AC
  Administered 2019-04-20: 10 mL via INTRAVENOUS
  Filled 2019-04-20: qty 10

## 2019-04-20 MED ORDER — HEPARIN SOD (PORK) LOCK FLUSH 100 UNIT/ML IV SOLN
500.0000 [IU] | Freq: Once | INTRAVENOUS | Status: AC
  Administered 2019-04-20: 500 [IU] via INTRAVENOUS
  Filled 2019-04-20: qty 5

## 2019-04-20 NOTE — Progress Notes (Signed)
[place] OFFICE PROGRESS NOTE  Patient Care Team: Tower, [name], MD as PCP - General Byrnett, [name], MD (General Surgery) [name], MD (Inactive) (Oncology) Alda Berthold, DO as Consulting Physician (Neurology) Cammie Sickle, MD as Consulting Physician (Oncology) [name], [facility] (Pharmacist)  Cancer Staging No matching staging information was found for the patient.   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  5 CM TUMOR MASS BIOPSIES POSITIVE FOR INVASIVE MAMMARY CARCINOMA TRIPLE NEGATIVE DISEASE(jANUARY, 2017)stage IIIa. ER/PR/her2 Neu-NEGATIVE  # Cytoxan and Adriamycin from March 18, 2015; carbo-taxol x4 cycles  # AUg 18th s/p Lumpec & SLNBx- Complete path CR [ypT0 ypsn0] s/p RT [oct 2017; Dr.Crystal]  3.  MUGA scan of the heart shows ejection fraction to be 61% (January, 2017)   ------------------------------------------------------------    DIAGNOSIS: BREAST CANCER  STAGE:  III       ;GOALS: curative  CURRENT/MOST RECENT THERAPY: surveillaince.     Carcinoma of upper-outer quadrant of left breast in female, estrogen receptor negative ([place])  11/21/2015 Initial Diagnosis   Carcinoma of upper-outer quadrant of left breast in female, estrogen receptor negative ([place])     INTERVAL HISTORY:  Suzanne Strong 74 y.o.  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  In the interim patient was evaluated by surgeon Dr. Tollie Pizza.  Denies any worsening cough or shortness of breath.  Denies any loss of appetite.  No weight loss.  Continues to complain of chronic tingling and numbness in the extremities.  Review of Systems   Constitutional: Negative for chills, diaphoresis, fever, malaise/fatigue and weight loss.  HENT: Negative for nosebleeds and sore throat.   Eyes: Negative for double vision.  Respiratory: Negative for hemoptysis, shortness of breath and wheezing.   Cardiovascular: Negative for chest pain, palpitations, orthopnea and leg swelling.  Gastrointestinal: Negative for abdominal pain, blood in stool, constipation, diarrhea, heartburn, melena, nausea and vomiting.  Genitourinary: Negative for dysuria, frequency and urgency.  Musculoskeletal: Negative for back pain and joint pain.  Skin: Negative.  Negative for itching and rash.  Neurological: Positive for tingling. Negative for focal weakness, weakness and headaches.  Endo/Heme/Allergies: Does not bruise/bleed easily.  Psychiatric/Behavioral: Negative for depression. The patient is not nervous/anxious and does not have insomnia.       PAST MEDICAL HISTORY :  Past Medical History:  Diagnosis Date  . Anemia    iron deficiency and B12 deficiency  . Anxiety   . Breast cancer ([place]) 02/25/2015   upper-outer quadrant of left female breast, Triple Negative. Neo-adjuvant chemo with complete pathologic response.   . Cervical dysplasia    conization  . Diabetes mellitus without complication ([place])   . Diverticulosis   . Fever 04/11/2015  . Gastroparesis    related to previous radiation tx  . GERD (gastroesophageal reflux disease)   . Hyperlipidemia   . Hypertension   . Neuropathy    legs/feet, S/P chemo meds  . Personal history of chemotherapy   . Personal history of radiation therapy 2017   LEFT lumpectomy w/ radiation  . RLS (restless legs syndrome)   . Shingles    Hx of  . 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 Wears dentures    partial bottom  PAST SURGICAL HISTORY :   Past Surgical History:  Procedure Laterality Date  . APPENDECTOMY    . BREAST BIOPSY Left 02/25/2015   INVASIVE MAMMARY CARCINOMA,Triple negative.  [name] BREAST CYST  ASPIRATION    . BREAST EXCISIONAL BIOPSY Left 12/2015   surgery  . BREAST LUMPECTOMY WITH NEEDLE LOCALIZATION Left 10/02/2015   Procedure: BREAST LUMPECTOMY WITH NEEDLE LOCALIZATION;  Surgeon: [name], MD;  Location: [facility];  Service: General;  Laterality: Left;  . BREAST LUMPECTOMY WITH SENTINEL LYMPH NODE BIOPSY Left 10/02/2015   Procedure: LEFT BREAST WIDE EXCISION WITH SENTINEL LYMPH NODE BX;  Surgeon: [name], MD;  Location: [facility];  Service: General;  Laterality: Left;  . BREAST SURGERY     breast biopsy benign  . CARPAL TUNNEL RELEASE Right 05/19/2017   Procedure: CARPAL TUNNEL RELEASE;  Surgeon: Earnestine Leys, MD;  Location: [facility];  Service: Orthopedics;  Laterality: Right;  . CATARACT EXTRACTION W/PHACO Right 10/26/2018   Procedure: CATARACT EXTRACTION PHACO AND INTRAOCULAR LENS PLACEMENT ([place]) right diabetic;  Surgeon: Leandrew Koyanagi, MD;  Location: [place];  Service: Ophthalmology;  Laterality: Right;  Diabetic - oral meds cancer center been notified and will come  . CATARACT EXTRACTION W/PHACO Left 11/16/2018   Procedure: CATARACT EXTRACTION PHACO AND INTRAOCULAR LENS PLACEMENT ([facility]) LEFT DIABETIC  01:01.0  17.0%  10.37;  Surgeon: Leandrew Koyanagi, MD;  Location: [place];  Service: Ophthalmology;  Laterality: Left;  Diabetic - oral meds Port-a-cath  . CHOLECYSTECTOMY    . Cologuard  07/22/2016   Negative  . ESOPHAGOGASTRODUODENOSCOPY  07/2009   normal with few gastric polyps  . MOLE REMOVAL    . PORT-A-CATH REMOVAL    . PORTACATH PLACEMENT    . PORTACATH PLACEMENT Right 03/12/2015   Procedure: INSERTION PORT-A-CATH;  Surgeon: [name], MD;  Location: [facility];  Service: General;  Laterality: Right;  . RADICAL NECK DISSECTION    . TONSILLECTOMY     cancer treated with chemo and radiation  . TRIGGER FINGER RELEASE Right 05/19/2017   Procedure: RELEASE TRIGGER FINGER/A-1 PULLEY ring finger;  Surgeon: Earnestine Leys,  MD;  Location: [facility];  Service: Orthopedics;  Laterality: Right;    FAMILY HISTORY :   Family History  Problem Relation Age of Onset  . Osteoporosis Mother   . Hyperlipidemia Mother   . Depression Mother   . Cancer Mother        skin cancer ? basal cell and lung ca heavy smoker  . Alcohol abuse Father   . Cancer Father        skin CA ? basal cell  . Heart disease Father        CHF  . Hyperlipidemia Brother   . Hypertension Brother   . Breast cancer Neg Hx     SOCIAL HISTORY:   Social History   Tobacco Use  . Smoking status: Former Smoker    Packs/day: 0.50    Years: 6.00    Pack years: 3.00    Types: Cigarettes    Quit date: 03/10/1984    Years since quitting: 35.1  . Smokeless tobacco: Never Used  Substance Use Topics  . Alcohol use: No    Alcohol/week: 0.0 standard drinks  . Drug use: No    ALLERGIES:  is allergic to amoxicillin; fluconazole; difuleron [ferrous fumarate-dss]; and other.  MEDICATIONS:  Current Outpatient Medications  Medication Sig Dispense Refill  . albuterol (VENTOLIN HFA) 108 (90 Base) MCG/ACT inhaler Inhale 2 puffs  into the lungs every 6 (six) hours as needed for wheezing or shortness of breath (tight chest). 18 g 3  . ALPRAZolam (XANAX) 0.5 MG tablet Take 1 tablet (0.5 mg total) by mouth 2 (two) times daily as needed for anxiety or sleep. 60 tablet 5  . amitriptyline (ELAVIL) 50 MG tablet Take 1 tablet (50 mg total) by mouth at bedtime. 90 tablet 3  . atorvastatin (LIPITOR) 10 MG tablet TAKE 1 TABLET(10 MG) BY MOUTH DAILY 90 tablet 1  . b complex vitamins tablet Take 1 tablet by mouth daily.      . chlorpheniramine-HYDROcodone (TUSSIONEX PENNKINETIC ER) 10-8 MG/5ML SUER Take 5 mLs by mouth every 12 (twelve) hours as needed for cough. Caution of sedation and habit 80 mL 0  . Cholecalciferol (VITAMIN D-1000 MAX ST) 25 MCG (1000 UT) tablet Take 4,000 Units by mouth daily.    [name] FERROUS SULFATE PO Take by mouth.    . gabapentin (NEURONTIN) 300  MG capsule Take 3 capsules (900 mg total) by mouth at bedtime. 270 capsule 0  . glipiZIDE (GLUCOTROL) 10 MG tablet Take 10 mg by mouth 2 (two) times daily before a meal.     . JANUMET XR 50-1000 MG TB24 Take 1 tablet by mouth 2 (two) times daily.   0  . lidocaine-prilocaine (EMLA) cream Apply 1 application topically as needed. Apply to port when going through Chemo    . MELOXICAM PO Take by mouth as needed.    . Multiple Vitamins-Minerals (WOMENS MULTIVITAMIN PO) Take by mouth.    [name] omeprazole (PRILOSEC) 20 MG capsule TAKE 1 CAPSULE(20 MG) BY MOUTH AT BEDTIME 90 capsule 1  . pregabalin (LYRICA) 150 MG capsule Take 1 capsule by mouth 2 (two) times a day.    [name] rOPINIRole (REQUIP) 1 MG tablet TAKE 3 TABLETS BY MOUTH AT BEDTIME 270 tablet 1  . sertraline (ZOLOFT) 100 MG tablet TAKE 2 TABLETS BY MOUTH EVERY DAY 180 tablet 1  . traZODone (DESYREL) 100 MG tablet TAKE 1/2 TO 1 (ONE-HALF TO ONE) TABLET BY MOUTH EVERY DAY AT BEDTIME AS NEEDED FOR INSOMNIA 90 tablet 1  . predniSONE (DELTASONE) 10 MG tablet Take 3 pills once daily by mouth for 3 days, then 2 pills once daily for 3 days, then 1 pill once daily for 3 days and then stop (Patient not taking: Reported on 03/23/2019) 18 tablet 0   No current facility-administered medications for this visit.   Facility-Administered Medications Ordered in Other Visits  Medication Dose Route Frequency Provider Last Rate Last Admin  . sodium chloride flush (NS) 0.9 % injection 10 mL  10 mL Intravenous PRN Cammie Sickle, MD   10 mL at 09/11/15 0845    PHYSICAL EXAMINATION: ECOG PERFORMANCE STATUS: 1 - Symptomatic but completely ambulatory  BP 128/76 (BP Location: Left Arm, Patient Position: Sitting, Cuff Size: Large)   Pulse 87   Temp (!) 96.5 F (35.8 C) (Tympanic)   Wt 227 lb (103 kg)   BMI 36.64 kg/m   Filed Weights   04/20/19 1129  Weight: 227 lb (103 kg)    Physical Exam  Constitutional: She is oriented to person, place, and time and  well-developed, well-nourished, and in no distress.  Obese.  She is walking herself.  Alone.  HENT:  Head: Normocephalic and atraumatic.  Mouth/Throat: Oropharynx is clear and moist. No oropharyngeal exudate.  Eyes: Pupils are equal, round, and reactive to light.  Cardiovascular: Normal rate and regular rhythm.  Pulmonary/Chest: No respiratory  distress. She has no wheezes.  Abdominal: Soft. Bowel sounds are normal. She exhibits no distension and no mass. There is no abdominal tenderness. There is no rebound and no guarding.  Musculoskeletal:        General: No tenderness or edema. Normal range of motion.     Cervical back: Normal range of motion and neck supple.  Neurological: She is alert and oriented to person, place, and time.  Skin: Skin is warm.  Psychiatric: Affect normal.    LABORATORY DATA:  I have reviewed the data as listed    Component Value Date/Time   NA 138 04/20/2019 1120   NA 142 03/01/2014 0846   K 4.1 04/20/2019 1120   K 4.3 03/01/2014 0846   CL 102 04/20/2019 1120   CL 102 03/01/2014 0846   CO2 26 04/20/2019 1120   CO2 28 03/01/2014 0846   GLUCOSE 110 (H) 04/20/2019 1120   GLUCOSE 219 (H) 03/01/2014 0846   BUN 15 04/20/2019 1120   BUN 12 03/01/2014 0846   CREATININE 0.84 04/20/2019 1120   CREATININE 0.95 03/01/2014 0846   CALCIUM 8.8 (L) 04/20/2019 1120   CALCIUM 8.6 03/01/2014 0846   PROT 7.0 04/20/2019 1120   PROT 7.5 03/01/2014 0846   ALBUMIN 3.6 04/20/2019 1120   ALBUMIN 3.4 03/01/2014 0846   AST 28 04/20/2019 1120   AST 20 03/01/2014 0846   ALT 22 04/20/2019 1120   ALT 28 03/01/2014 0846   ALKPHOS 88 04/20/2019 1120   ALKPHOS 122 (H) 03/01/2014 0846   BILITOT 0.3 04/20/2019 1120   BILITOT 0.2 03/01/2014 0846   GFRNONAA >60 04/20/2019 1120   GFRNONAA >60 03/01/2014 0846   GFRNONAA >60 08/24/2013 1015   GFRAA >60 04/20/2019 1120   GFRAA >60 03/01/2014 0846   GFRAA >60 08/24/2013 1015    No results found for: SPEP, UPEP  Lab Results   Component Value Date   WBC 5.1 04/20/2019   NEUTROABS 3.5 04/20/2019   HGB 10.3 (L) 04/20/2019   HCT 33.4 (L) 04/20/2019   MCV 91.0 04/20/2019   PLT 184 04/20/2019      Chemistry      Component Value Date/Time   NA 138 04/20/2019 1120   NA 142 03/01/2014 0846   K 4.1 04/20/2019 1120   K 4.3 03/01/2014 0846   CL 102 04/20/2019 1120   CL 102 03/01/2014 0846   CO2 26 04/20/2019 1120   CO2 28 03/01/2014 0846   BUN 15 04/20/2019 1120   BUN 12 03/01/2014 0846   CREATININE 0.84 04/20/2019 1120   CREATININE 0.95 03/01/2014 0846      Component Value Date/Time   CALCIUM 8.8 (L) 04/20/2019 1120   CALCIUM 8.6 03/01/2014 0846   ALKPHOS 88 04/20/2019 1120   ALKPHOS 122 (H) 03/01/2014 0846   AST 28 04/20/2019 1120   AST 20 03/01/2014 0846   ALT 22 04/20/2019 1120   ALT 28 03/01/2014 0846   BILITOT 0.3 04/20/2019 1120   BILITOT 0.2 03/01/2014 0846       RADIOGRAPHIC STUDIES: I have personally reviewed the radiological images as listed and agreed with the findings in the report. No results found.   ASSESSMENT & PLAN:  Carcinoma of upper-outer quadrant of left breast in female, estrogen receptor negative ([place]) # Left breast cancer stage III  TRIPLE NEGATIVE-  s/p neoadjuvant chemotherapy-currently under surveillance. Feb 2021- Mammo- WNL.  Stable.  #Continue close surveillance evidence of recurrence.  Discussed that since she is approximately 4 years out  diagnosis treatment of her triple negative breast cancer-hopefully she is cured.   # Mild anemia -hemoglobin 10-11 for the last 10 years; stable.  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   # medi-port/ IV access- ok to have the port taken out.  Discussed with Dr. Tollie Pizza.  #Screening for osteoporosis-last bone density 2016-T score 0.5; normal.  We will get a bone density test.   # DISPOSITION: # follow up  in 6 m with MD/labs-cbc/cmp/ca-27-29;BMD prior--Dr.B   Orders Placed This Encounter  Procedures  . DG Bone Density    Standing Status:   Future    Standing Expiration Date:   04/19/2020    Order Specific Question:   Reason for Exam (SYMPTOM  OR DIAGNOSIS REQUIRED)    Answer:   osteoporosis screening    Order Specific Question:   Preferred imaging location?    Answer:    [facility]  . CBC with Differential    Standing Status:   Future    Standing Expiration Date:   04/19/2020  . Comprehensive metabolic panel    Standing Status:   Future    Standing Expiration Date:   04/19/2020  . Cancer antigen 27.29    Standing Status:   Future    Standing Expiration Date:   04/19/2020  . Ambulatory referral to General Surgery    Referral Priority:   Routine    Referral Type:   Surgical    Referral Reason:   Specialty Services Required    Referred to Provider:   [name], MD    Requested Specialty:   General Surgery    Number of Visits Requested:   1   All questions were answered. The patient knows to call the clinic with any problems, questions or concerns.      Cammie Sickle, MD 04/21/2019 7:25 AM

## 2019-04-20 NOTE — Assessment & Plan Note (Addendum)
#   Left breast cancer stage III  TRIPLE NEGATIVE-  s/p neoadjuvant chemotherapy-currently under surveillance. Feb 2021- Mammo- WNL.  Stable.  #Continue close surveillance evidence of recurrence.  Discussed that since she is approximately 4 years out diagnosis treatment of her triple negative breast cancer-hopefully she is cured.   # Mild anemia -hemoglobin 10-11 for the last 10 years; stable.  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   # medi-port/ IV access- ok to have the port taken out.  Discussed with Dr. Tollie Pizza.  #Screening for osteoporosis-last bone density 2016-T score 0.5; normal.  We will get a bone density test.   # DISPOSITION: # follow up in 6 m with MD/labs-cbc/cmp/ca-27-29;BMD prior--Dr.B

## 2019-04-20 NOTE — Telephone Encounter (Signed)
Last OV was a doxy for a cough on 02/21/19, last filled 10/27/18 #90 tabs with 5 refills

## 2019-04-21 LAB — CANCER ANTIGEN 27.29: CA 27.29: 16.6 U/mL (ref 0.0–38.6)

## 2019-05-03 ENCOUNTER — Ambulatory Visit: Admission: RE | Admit: 2019-05-03 | Payer: PPO | Source: Ambulatory Visit | Admitting: Radiation Oncology

## 2019-05-08 ENCOUNTER — Telehealth: Payer: Self-pay

## 2019-05-08 DIAGNOSIS — E119 Type 2 diabetes mellitus without complications: Secondary | ICD-10-CM

## 2019-05-08 DIAGNOSIS — E7800 Pure hypercholesterolemia, unspecified: Secondary | ICD-10-CM

## 2019-05-08 NOTE — Telephone Encounter (Signed)
I would like to request a referral for Suzanne Strong to chronic care management pharmacy services for the following conditions:   Controlled type 2 diabetes mellitus without complication [id number]  Hyperlipidemia [E78.5]  [name], PharmD Clinical Pharmacist [facility] [phone]

## 2019-05-10 ENCOUNTER — Telehealth: Payer: PPO

## 2019-05-18 ENCOUNTER — Ambulatory Visit
Admission: RE | Admit: 2019-05-18 | Discharge: 2019-05-18 | Disposition: A | Discharge status: Home / Self Care | Payer: PPO | Source: Ambulatory Visit | Attending: Radiation Oncology | Admitting: Radiation Oncology

## 2019-05-18 ENCOUNTER — Other Ambulatory Visit: Payer: Self-pay

## 2019-05-18 ENCOUNTER — Encounter: Payer: Self-pay | Admitting: Radiation Oncology

## 2019-05-18 VITALS — BP 139/58 | HR 84 | Temp 95.2°F | Resp 18 | Wt 227.8 lb

## 2019-05-18 DIAGNOSIS — Z853 Personal history of malignant neoplasm of breast: Secondary | ICD-10-CM | POA: Insufficient documentation

## 2019-05-18 DIAGNOSIS — Z923 Personal history of irradiation: Secondary | ICD-10-CM | POA: Diagnosis not present

## 2019-05-18 DIAGNOSIS — C50412 Malignant neoplasm of upper-outer quadrant of left female breast: Secondary | ICD-10-CM

## 2019-05-18 DIAGNOSIS — Z171 Estrogen receptor negative status [ER-]: Secondary | ICD-10-CM

## 2019-05-18 NOTE — Progress Notes (Signed)
Radiation Oncology Follow up Note  Name: Suzanne Strong   Date:   05/18/2019 MRN:  [id number] DOB: 07/31/1945    This 74 y.o. female presents to the clinic today for 4-year follow-up status post whole breast radiation to her left breast for triple negative invasive mammary carcinoma.  REFERRING PROVIDER: [name], MD  HPI: Patient is a 74 year old female now out 4 years having completed whole breast radiation to her left breast for triple negative invasive mammary carcinoma seen today in routine follow-up she is doing well.  She specifically denies breast tenderness cough or bone pain.  She is not on antiestrogen therapy based on the triple negative nature of her disease..  She mammograms back in February which I have reviewed were BI-RADS 2 benign.  COMPLICATIONS OF TREATMENT: none  FOLLOW UP COMPLIANCE: keeps appointments   PHYSICAL EXAM:  BP (!) 139/58 (BP Location: Left Arm, Patient Position: Sitting)   Pulse 84   Temp (!) 95.2 F (35.1 C) (Tympanic)   Resp 18   Wt 227 lb 12.8 oz (103.3 kg)   BMI 36.77 kg/m  Lungs are clear to A&P cardiac examination essentially unremarkable with regular rate and rhythm. No dominant mass or nodularity is noted in either breast in 2 positions examined. Incision is well-healed. No axillary or supraclavicular adenopathy is appreciated. Cosmetic result is excellent.  Well-developed well-nourished patient in NAD. HEENT reveals PERLA, EOMI, discs not visualized.  Oral cavity is clear. No oral mucosal lesions are identified. Neck is clear without evidence of cervical or supraclavicular adenopathy. Lungs are clear to A&P. Cardiac examination is essentially unremarkable with regular rate and rhythm without murmur rub or thrill. Abdomen is benign with no organomegaly or masses noted. Motor sensory and DTR levels are equal and symmetric in the upper and lower extremities. Cranial nerves II through XII are grossly intact. Proprioception is intact. No  peripheral adenopathy or edema is identified. No motor or sensory levels are noted. Crude visual fields are within normal range.  RADIOLOGY RESULTS: Mammograms reviewed compatible with above-stated findings  PLAN: Present time she is now 4 years out with no evidence of disease.  I am pleased with her overall progress.  Of asked to see her back in 1 year and then will discontinue follow-up care.  She again remains off antiestrogen therapy based on the triple negative nature of her disease.  Patient knows to call with any concerns at any time.  I would like to take this opportunity to thank you for allowing me to participate in the care of your patient.Noreene Filbert, MD

## 2019-05-23 DIAGNOSIS — Z853 Personal history of malignant neoplasm of breast: Secondary | ICD-10-CM | POA: Diagnosis not present

## 2019-05-25 ENCOUNTER — Other Ambulatory Visit: Payer: Self-pay | Admitting: *Deleted

## 2019-05-26 MED ORDER — GABAPENTIN 300 MG PO CAPS: 900.0000 mg | ORAL_CAPSULE | Freq: Every day | ORAL | 0 refills | Status: DC

## 2019-05-31 ENCOUNTER — Other Ambulatory Visit: Payer: Self-pay

## 2019-05-31 ENCOUNTER — Ambulatory Visit: Payer: PPO

## 2019-05-31 DIAGNOSIS — E119 Type 2 diabetes mellitus without complications: Secondary | ICD-10-CM

## 2019-05-31 DIAGNOSIS — G8929 Other chronic pain: Secondary | ICD-10-CM

## 2019-05-31 DIAGNOSIS — F329 Major depressive disorder, single episode, unspecified: Secondary | ICD-10-CM

## 2019-05-31 DIAGNOSIS — E7800 Pure hypercholesterolemia, unspecified: Secondary | ICD-10-CM

## 2019-05-31 DIAGNOSIS — C50412 Malignant neoplasm of upper-outer quadrant of left female breast: Secondary | ICD-10-CM

## 2019-05-31 DIAGNOSIS — M542 Cervicalgia: Secondary | ICD-10-CM

## 2019-05-31 DIAGNOSIS — G2581 Restless legs syndrome: Secondary | ICD-10-CM

## 2019-05-31 DIAGNOSIS — F411 Generalized anxiety disorder: Secondary | ICD-10-CM

## 2019-05-31 DIAGNOSIS — F32A Depression, unspecified: Secondary | ICD-10-CM

## 2019-05-31 DIAGNOSIS — Z171 Estrogen receptor negative status [ER-]: Secondary | ICD-10-CM

## 2019-05-31 NOTE — Chronic Care Management (AMB) (Signed)
Chronic Care Management Pharmacy  Name: Suzanne Strong  MRN: [id number] DOB: 04-02-1945  Chief Complaint/ HPI  Suzanne Strong,  74 y.o., female presents for their Initial CCM visit with the clinical pharmacist via telephone.  PCP : [name], MD   Their chronic conditions include: hyperlipidemia, type 2 diabetes, chronic pain, restless leg syndrome, anxiety, depression, vitamin D deficiency, gastroparesis   Patient concerns: some concern with cost of [place]  Office Visits:  02/21/19: [facility] - cough, prednisone and cough syrup  Consult Visit:  03/23/19: Insomnia/behavioral health - note unavailable  01/27/19: endocrinology -diabetes controlled, continue low carb diet, check sugars daily, cont ACEI and statin  07/26/18: endocrinology - discontinue gabapentin, replace with Lyrica 150 mg BID   Allergies  Allergen Reactions  . Amoxicillin Swelling    REACTION: rash, swelling Has patient had a PCN reaction causing immediate rash, facial/tongue/throat swelling, SOB or lightheadedness with hypotension: yes Has patient had a PCN reaction causing severe rash involving mucus membranes or skin necrosis: no Has patient had a PCN reaction that required hospitalization: no Has patient had a PCN reaction occurring within the last 10 years: yes If all of the above answers are "NO", then may proceed with Cephalosporin use.   [name] Fluconazole Rash    REACTION: hives  . Difuleron [Ferrous Fumarate-Dss]   . 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 albuterol (VENTOLIN HFA) 108 (90 Base) MCG/ACT inhaler Inhale 2 puffs into the lungs every 6 (six) hours as needed for wheezing or shortness of breath (tight chest).   . ALPRAZolam (XANAX) 0.5 MG tablet Take 1 tablet (0.5 mg total) by mouth 2 (two) times daily as needed for anxiety or sleep.    [name] amitriptyline (ELAVIL) 50 MG tablet Take 1 tablet (50 mg total) by mouth at bedtime.   [name] atorvastatin (LIPITOR) 10 MG tablet TAKE 1 TABLET(10 MG) BY MOUTH DAILY   . b complex vitamins tablet Take 1 tablet by mouth daily.     . chlorpheniramine-HYDROcodone (TUSSIONEX PENNKINETIC ER) 10-8 MG/5ML SUER Take 5 mLs by mouth every 12 (twelve) hours as needed for cough. Caution of sedation and habit   . Cholecalciferol (VITAMIN D-1000 MAX ST) 25 MCG (1000 UT) tablet Take 4,000 Units by mouth daily.   [name] FERROUS SULFATE PO Take by mouth.   . gabapentin (NEURONTIN) 300 MG capsule Take 3 capsules (900 mg total) by mouth at bedtime.   [name] glipiZIDE (GLUCOTROL) 10 MG tablet Take 10 mg by mouth 2 (two) times daily before a meal.    . JANUMET XR 50-1000 MG TB24 Take 1 tablet by mouth 2 (two) times daily.    [name] lidocaine-prilocaine (EMLA) cream Apply 1 application topically as needed. Apply to port when going through Chemo   . MELOXICAM PO Take by mouth as needed.   . Multiple Vitamins-Minerals (WOMENS MULTIVITAMIN PO) Take by mouth.   [name] omeprazole (PRILOSEC) 20 MG capsule TAKE 1 CAPSULE(20 MG) BY MOUTH AT BEDTIME   . predniSONE (DELTASONE) 10 MG tablet Take 3 pills once daily by mouth for 3 days, then 2 pills once daily for 3 days, then 1 pill once daily for 3 days and then stop   . pregabalin (LYRICA) 150 MG capsule Take 1 capsule by mouth 2 (two) times a day. 04/20/2019: Currently taking 1 in the  am and 1 at night  . rOPINIRole (REQUIP) 1 MG tablet TAKE 3 TABLETS BY MOUTH AT BEDTIME   . sertraline (ZOLOFT) 100 MG tablet TAKE 2 TABLETS BY MOUTH EVERY DAY   . traZODone (DESYREL) 100 MG tablet TAKE 1/2 TO 1 (ONE-HALF TO ONE) TABLET BY MOUTH EVERY DAY AT BEDTIME AS NEEDED FOR INSOMNIA    Facility-Administered Encounter Medications as of 05/31/2019  Medication  . sodium chloride flush (NS) 0.9 % injection 10 mL   Current Diagnosis/Assessment: Goals    . Patient Stated     Starting 07/15/18, I will continue to  take medications as prescribed.     . Pharmacy Care Plan     CARE PLAN ENTRY  Current Barriers:  . Chronic Disease Management support, education, and care coordination needs related to diabetes, neuropathy, anxiety/sleep  Pharmacist Clinical Goal(s):  [name] Over the next 3 month, patient will work with PharmD and primary care provider to address the following goals: o Diabetes: Reduce cost burden with Janumet through patient assistance application; Work towards exercise goal of walking three days per week. o Anxiety/sleep: Improve time to sleep onset with sleep hygiene. May increase trazodone to 1 tablet 30 minutes before bedtime. o Neuropathy: maintain pain control and prevent adverse effects  Interventions: . Comprehensive medication review performed . Consult with oncology regarding potential duplicate therapy of gabapentin and Lyrica . Pharmacy coordination, adherence packaging, and delivery services  Patient Self Care Activities:  For the next 3 months until follow up visit:  . Continue to monitor fasting blood glucose daily . Continue to inspect feet for cuts, scrapes, and infection daily . Adhere to heart healthy diet such as the DASH diet (see handout) . Work towards increasing walking from 25 minutes, 2 days per week to 3 days per week . Continue good sleep hygiene including avoiding caffeine 4-5 hours prior to bed, avoid screens/television in the bedroom, use the bed only for sleep, if unable to fall asleep within 30 minutes, get out of bed and read or do an activity that will promote sleep. Avoid clock watching or lying in bed for extended periods of time as this can provoke anxiety.   Initial goal documentation      Hyperlipidemia  Lipid Panel     Component Value Date/Time   CHOL 144 07/15/2018 0927   TRIG 180.0 (H) 07/15/2018 0927   HDL 44.40 07/15/2018 0927   CHOLHDL 3 07/15/2018 0927   VLDL 36.0 07/15/2018 0927   LDLCALC 64 07/15/2018 0927   LDLDIRECT 97.0  06/29/2017 0902    The 10-year ASCVD risk score [name], et al., 2013) is: 21.4%   Values used to calculate the score:     Age: 72 years     Sex: Female     Is Non-Hispanic African American: No     Diabetic: Yes     Tobacco smoker: No     Systolic Blood Pressure: [id number] mmHg     Is BP treated: No     HDL Cholesterol: 44.4 mg/dL     Total Cholesterol: 144 mg/dL   LDL goal < 100 Patient has failed these meds in past: none Patient is currently controlled on the following medications:   Atorvastatin 10 mg - 1 tablet daily (AM)  Plan: Continue current medications  Diabetes   Followed by Dr. Gabriel Carina - pt reports last A1c 6.8% Recent Relevant Labs: Lab Results  Component Value Date/Time   HGBA1C 8.0 (H) 06/29/2017 09:02 AM   HGBA1C  7.3 12/09/2016 12:00 AM   HGBA1C 7.0 (H) 09/18/2014 08:40 AM    Checking BG: Daily (fasting)  Recent FBG Readings: 107-147 Denies hypoglycemia   Patient has failed these meds in past: metformin alone (nausea/vomiting) Patient is currently controlled on the following medications:   Glipizide 10 mg - 1 tablet BID (breakfast and dinner)  Janumet XR 50-1000 mg - 1 tablet BID (breakfast and dinner)  Last diabetic eye exam:  Lab Results  Component Value Date/Time   HMDIABEYEEXA No Retinopathy 08/15/2018 12:00 AM    Last diabetic foot exam: Dr. Gabriel Carina evaluates at each visit; pt checks her feet daily   Diet: limits red meat and usually only eats vegetarian diet; oatmeal for breakfast, stirfry with vegetables for dinner, limits bread; drinks: coffee with cream and tea ([place]on sugarless tea bag), drinks a lot of water, denies soda  Exercise: walks after work 2 days a week - 25 minute intervals  Plan: Continue current medications; Work on increasing walking to three days a week. Send Janumet patient assistance requirements.   Anxiety/Depression   Followed by Anderson Malta at [facility]  Patient has failed these meds in past: none Patient is  currently controlled on the following medications:   Sertraline 100 mg - 2 tablets daily (AM)  Alprazolam 0.5 mg - 1 tablet BID PRN anxiety/sleep  Trazodone 100 mg - 1/2 to 1 tablet qhs PRN sleep  We discussed: taking alprazolam once daily at bedtime for the past month due to increased anxiety with husband passing, taking trazodone 1/2 tablet every night for sleep - reports on good nights it takes her 10 minutes to fall asleep, about 2-3 days a week, may not fall asleep until 2-4 AM (takes an additional 1/2 tablet trazodone); neuropathy also impairs sleep  Plan: Continue current medications; May take 1 full tablet of trazodone before bed to improve time to sleep onset rather than 1/2 and an additional 1/2 later in the morning. Call or discuss with psychiatrist is sleep onset does not improve. Review sleep hygiene.   Chronic Pain/Neuropathy   Patient has failed these meds in past: none Patient is currently controlled on the following medications:   Gabapentin 300 mg - takes 3 caps qhs   Pregabalin 150 mg - 1 capsule BID (started 07/2018)  Meloxicam PRN (short term for carpel tunnel surgery, not taking)  We discussed: has noticed some improvement in pain with addition of Lyrica, denies OTC pain meds, also started alpha lipoic acid 200 mg OTC recently - 1 tablet qhs  Plan: Continue current medications; Consult with oncology/endo regarding potential duplicate therapy.   RLS   Patient has failed these meds in past: none Patient is currently controlled on the following medications:   Ropinirole 1 mg - 3 tabs qhs   Amitriptyline 50 mg - 1 tablet qhs  We discussed: well controlled; has occasional flares  Plan: Continue current medications  Gastroparesis   Patient has failed these meds in past: none Patient is currently controlled on the following medications:   Omeprazole 20 mg - 1 tablet daily at bedtime  We discussed: denies any acid reflux or indigestion  Plan: Continue  current medications  Vitamin D Deficiency    Vitamin D (07/15/2018): 29 - started vitamin D at this time  Patient has failed these meds in past: none Patient is currently controlled on the following medications:   Vitamin D 50 mcg - 1 capsule twice daily   We discussed: none  Plan: Continue current medications; Update dosing  in medication list.  Medication Management  Misc: Albuterol 90 mcg - PRN (still taking once or twice a week for cough lingering since bronchitis last year)  OTCs: B-complex, multivitamin, alpha lipoic acid 200 mg   Pharmacy/Benefits: [facility] ([place] - picks up, medications are not synchronized --> interested in switching to [facility]  Adherence: pillbox (7 day - morning/bedtime)  Affordability: Janumet - cost is high in donut hole   Verbal consent obtained for [facility] enhanced pharmacy services (medication synchronization, adherence packaging, delivery coordination). A medication sync plan was created to allow patient to get all medications delivered once every 30 to 90 days per patient preference. Patient understands they have freedom to choose pharmacy and clinical pharmacist will coordinate care between all prescribers and [facility].  CCM Follow Up: 08/31/19 at 8:30 AM (telephone)  [name], PharmD Clinical Pharmacist [facility] at [facility] [phone]

## 2019-06-01 ENCOUNTER — Telehealth: Payer: Self-pay

## 2019-06-01 ENCOUNTER — Other Ambulatory Visit: Payer: Self-pay | Admitting: Licensed Clinical Social Worker

## 2019-06-01 NOTE — Telephone Encounter (Signed)
Hi Suzanne Strong- Thanks for reaching out. I m not aware of pt being on Lyrica. I think she can come off gabapentin. Pt will need to continue Lyrica.  H/T- please call pt with above recommendations.  Thanks GB

## 2019-06-01 NOTE — Telephone Encounter (Signed)
Patient returned call and will stop Gabapentin and continue on Pregabalin. Patient agreed and understood this plan

## 2019-06-01 NOTE — Telephone Encounter (Signed)
Consult: Potential duplicate therapy  Patient is taking gabapentin 300 mg 3 tablets at bedtime (per Dr. Rogue Bussing) and Lyrica 150 mg twice daily (per Dr. Gabriel Carina) for neuropathy. Requesting confirmation of dual therapy or if you would like her to be on a single agent. She has been taking both since June 2020.   Reviewed last visit with Dr. Rogue Bussing and plan stated continue gabapentin (Lyrica not mentioned) for PN. Reviewed Dr. Joycie Peek note from 07/2018 and pt was to discontinue gabapentin and replace with Lyrica 150 mg BID.   Thank you!  [name], PharmD Clinical Pharmacist [place] [facility] at [facility] [phone]

## 2019-06-01 NOTE — Telephone Encounter (Signed)
Left vm for patient to return my phone call. 

## 2019-06-01 NOTE — Patient Instructions (Addendum)
Dear Suzanne Strong,  It was a pleasure meeting you during our initial appointment on May 31, 2019. Below is a summary of the goals we discussed and components of chronic care management. Please contact me anytime with questions or concerns.   Visit Information  Goals Addressed            This Visit's Progress   . Pharmacy Care Plan       CARE PLAN ENTRY  Current Barriers:  . Chronic Disease Management support, education, and care coordination needs related to diabetes, neuropathy, anxiety/sleep  Pharmacist Clinical Goal(s):  [name] Over the next 3 month, patient will work with PharmD and primary care provider to address the following goals: o Diabetes: Reduce cost burden with Janumet through patient assistance application; Work towards exercise goal of walking three days per week. o Anxiety/sleep: Improve time to sleep onset with sleep hygiene. May increase trazodone to 1 tablet 30 minutes before bedtime. o Neuropathy: maintain pain control and prevent adverse effects  Interventions: . Comprehensive medication review performed . Consult with oncology regarding potential duplicate therapy of gabapentin and Lyrica . Pharmacy coordination, adherence packaging, and delivery services  Patient Self Care Activities:  For the next 3 months until follow up visit:  . Continue to monitor fasting blood glucose daily . Continue to inspect feet for cuts, scrapes, and infection daily . Adhere to heart healthy diet such as the DASH diet (see handout) . Work towards increasing walking from 25 minutes, 2 days per week to 3 days per week . Continue good sleep hygiene including avoiding caffeine 4-5 hours prior to bed, avoid screens/television in the bedroom, use the bed only for sleep, if unable to fall asleep within 30 minutes, get out of bed and read or do an activity that will promote sleep. Avoid clock watching or lying in bed for extended periods of time as this can provoke anxiety.   Initial  goal documentation      [name] was given information about Chronic Care Management services today including:  1. CCM service includes personalized support from designated clinical staff supervised by her physician, including individualized plan of care and coordination with other care providers 2. 24/7 contact phone numbers for assistance for urgent and routine care needs. 3. Standard insurance, coinsurance, copays and deductibles apply for chronic care management only during months in which we provide at least 20 minutes of these services. Most insurances cover these services at 100%, however patients may be responsible for any copay, coinsurance and/or deductible if applicable. This service may help you avoid the need for more expensive face-to-face services. 4. Only one practitioner may furnish and bill the service in a calendar month. 5. The patient may stop CCM services at any time (effective at the end of the month) by phone call to the office staff.  Patient agreed to services and verbal consent obtained.   The patient verbalized understanding of instructions provided today and agreed to receive a mailed copy of patient instruction and/or educational materials. Telephone follow up appointment with pharmacy team member scheduled for: 08/31/19 at 8:30 AM (telephone)  [name], PharmD Clinical Pharmacist [place] at [facility] [phone]  [place] stands for "Dietary Approaches to Stop Hypertension." The DASH eating plan is a healthy eating plan that has been shown to reduce high blood pressure (hypertension). It may also reduce your risk for type 2 diabetes, heart disease, and stroke. The DASH eating plan may also help with weight  loss. What are tips for following this plan?  General guidelines  Avoid eating more than 2,300 mg (milligrams) of salt (sodium) a day. If you have hypertension, you may need to reduce your sodium intake to 1,500 mg a  day.  Limit alcohol intake to no more than 1 drink a day for nonpregnant women and 2 drinks a day for men. One drink equals 12 oz of beer, 5 oz of wine, or 1 oz of hard liquor.  Work with your health care provider to maintain a healthy body weight or to lose weight. Ask what an ideal weight is for you.  Get at least 30 minutes of exercise that causes your heart to beat faster (aerobic exercise) most days of the week. Activities may include walking, swimming, or biking.  Work with your health care provider or diet and nutrition specialist (dietitian) to adjust your eating plan to your individual calorie needs. Reading food labels   Check food labels for the amount of sodium per serving. Choose foods with less than 5 percent of the Daily Value of sodium. Generally, foods with less than 300 mg of sodium per serving fit into this eating plan.  To find whole grains, look for the word "whole" as the first word in the ingredient list. Shopping  Buy products labeled as "low-sodium" or "no salt added."  Buy fresh foods. Avoid canned foods and premade or frozen meals. Cooking  Avoid adding salt when cooking. Use salt-free seasonings or herbs instead of table salt or sea salt. Check with your health care provider or pharmacist before using salt substitutes.  Do not fry foods. Cook foods using healthy methods such as baking, boiling, grilling, and broiling instead.  Cook with heart-healthy oils, such as olive, canola, soybean, or sunflower oil. Meal planning  Eat a balanced diet that includes: ? 5 or more servings of fruits and vegetables each day. At each meal, try to fill half of your plate with fruits and vegetables. ? Up to 6-8 servings of whole grains each day. ? Less than 6 oz of lean meat, poultry, or fish each day. A 3-oz serving of meat is about the same size as a deck of cards. One egg equals 1 oz. ? 2 servings of low-fat dairy each day. ? A serving of nuts, seeds, or beans 5 times  each week. ? Heart-healthy fats. Healthy fats called Omega-3 fatty acids are found in foods such as flaxseeds and coldwater fish, like sardines, salmon, and mackerel.  Limit how much you eat of the following: ? Canned or prepackaged foods. ? Food that is high in trans fat, such as fried foods. ? Food that is high in saturated fat, such as fatty meat. ? Sweets, desserts, sugary drinks, and other foods with added sugar. ? Full-fat dairy products.  Do not salt foods before eating.  Try to eat at least 2 vegetarian meals each week.  Eat more home-cooked food and less restaurant, buffet, and fast food.  When eating at a restaurant, ask that your food be prepared with less salt or no salt, if possible. What foods are recommended? The items listed may not be a complete list. Talk with your dietitian about what dietary choices are best for you. Grains Whole-grain or whole-wheat bread. Whole-grain or whole-wheat pasta. Brown rice. [place]. Bulgur. Whole-grain and low-sodium cereals. Pita bread. Low-fat, low-sodium crackers. Whole-wheat flour tortillas. Vegetables Fresh or frozen vegetables (raw, steamed, roasted, or grilled). Low-sodium or reduced-sodium tomato and vegetable juice. Low-sodium or  reduced-sodium tomato sauce and tomato paste. Low-sodium or reduced-sodium canned vegetables. Fruits All fresh, dried, or frozen fruit. Canned fruit in natural juice (without added sugar). Meat and other protein foods Skinless chicken or [place]. Ground chicken or [place]. Pork with fat trimmed off. Fish and seafood. Egg whites. Dried beans, peas, or lentils. Unsalted nuts, nut butters, and seeds. Unsalted canned beans. Lean cuts of beef with fat trimmed off. Low-sodium, lean deli meat. Dairy Low-fat (1%) or fat-free (skim) milk. Fat-free, low-fat, or reduced-fat cheeses. Nonfat, low-sodium ricotta or cottage cheese. Low-fat or nonfat yogurt. Low-fat, low-sodium cheese. Fats and oils Soft margarine  without trans fats. Vegetable oil. Low-fat, reduced-fat, or light mayonnaise and salad dressings (reduced-sodium). Canola, safflower, olive, soybean, and sunflower oils. Avocado. Seasoning and other foods Herbs. Spices. Seasoning mixes without salt. Unsalted popcorn and pretzels. Fat-free sweets. What foods are not recommended? The items listed may not be a complete list. Talk with your dietitian about what dietary choices are best for you. Grains Baked goods made with fat, such as croissants, muffins, or some breads. Dry pasta or rice meal packs. Vegetables Creamed or fried vegetables. Vegetables in a cheese sauce. Regular canned vegetables (not low-sodium or reduced-sodium). Regular canned tomato sauce and paste (not low-sodium or reduced-sodium). Regular tomato and vegetable juice (not low-sodium or reduced-sodium). Angie Fava. Olives. Fruits Canned fruit in a light or heavy syrup. Fried fruit. Fruit in cream or butter sauce. Meat and other protein foods Fatty cuts of meat. Ribs. Fried meat. Berniece Salines. Sausage. Bologna and other processed lunch meats. Salami. Fatback. Hotdogs. Bratwurst. Salted nuts and seeds. Canned beans with added salt. Canned or smoked fish. Whole eggs or egg yolks. Chicken or [place] with skin. Dairy Whole or 2% milk, cream, and half-and-half. Whole or full-fat cream cheese. Whole-fat or sweetened yogurt. Full-fat cheese. Nondairy creamers. Whipped toppings. Processed cheese and cheese spreads. Fats and oils Butter. Stick margarine. Lard. Shortening. Ghee. Bacon fat. Tropical oils, such as coconut, palm kernel, or palm oil. Seasoning and other foods Salted popcorn and pretzels. Onion salt, garlic salt, seasoned salt, table salt, and sea salt. Worcestershire sauce. Tartar sauce. Barbecue sauce. Teriyaki sauce. Soy sauce, including reduced-sodium. Steak sauce. Canned and packaged gravies. Fish sauce. Oyster sauce. Cocktail sauce. Horseradish that you find on the shelf. Ketchup.  Mustard. Meat flavorings and tenderizers. Bouillon cubes. Hot sauce and Tabasco sauce. Premade or packaged marinades. Premade or packaged taco seasonings. Relishes. Regular salad dressings. Where to find more information:  National Heart, Lung, and [place]: https://wilson-eaton.com/  American Heart Association: www.heart.org Summary  The DASH eating plan is a healthy eating plan that has been shown to reduce high blood pressure (hypertension). It may also reduce your risk for type 2 diabetes, heart disease, and stroke.  With the DASH eating plan, you should limit salt (sodium) intake to 2,300 mg a day. If you have hypertension, you may need to reduce your sodium intake to 1,500 mg a day.  When on the DASH eating plan, aim to eat more fresh fruits and vegetables, whole grains, lean proteins, low-fat dairy, and heart-healthy fats.  Work with your health care provider or diet and nutrition specialist (dietitian) to adjust your eating plan to your individual calorie needs. This information is not intended to replace advice given to you by your health care provider. Make sure you discuss any questions you have with your health care provider. Document Revised: 01/15/2017 Document Reviewed: 01/27/2016 Elsevier Patient Education  2020 [employer].

## 2019-06-02 ENCOUNTER — Other Ambulatory Visit: Payer: Self-pay

## 2019-06-02 MED ORDER — ATORVASTATIN CALCIUM 10 MG PO TABS: ORAL_TABLET | ORAL | 3 refills | Status: DC

## 2019-06-02 MED ORDER — OMEPRAZOLE 20 MG PO CPDR: DELAYED_RELEASE_CAPSULE | ORAL | 3 refills | Status: DC

## 2019-06-02 MED ORDER — ROPINIROLE HCL 1 MG PO TABS: 3.0000 mg | ORAL_TABLET | Freq: Every day | ORAL | 0 refills | Status: DC

## 2019-06-02 NOTE — Telephone Encounter (Signed)
Suzanne Strong is transferring her preferred pharmacy to [facility]. Preferred pharmacy updated in chart. The following medication refills are needed:   Omeprazole, ropinirole, atorvastatin  Thank you!  [name], PharmD Clinical Pharmacist [place] [facility] at [facility] [phone]

## 2019-06-02 NOTE — Telephone Encounter (Signed)
Since Requip needs to be sent will pend the 3 Rxs for PCP approval

## 2019-06-19 DIAGNOSIS — E119 Type 2 diabetes mellitus without complications: Secondary | ICD-10-CM | POA: Diagnosis not present

## 2019-06-19 LAB — HM DIABETES EYE EXAM

## 2019-06-21 ENCOUNTER — Encounter: Payer: Self-pay | Admitting: Family Medicine

## 2019-07-13 ENCOUNTER — Other Ambulatory Visit: Payer: Self-pay

## 2019-07-13 NOTE — Telephone Encounter (Signed)
Pt left VM on triage asking for [place] to call her.  I called her back and she is asking for a refill of alprazolam be sent to [facility]. I advised her that a rx was sent to [facility] 03-23-19 #60/5. [facility] should have transferred that rx. Even if she loses 1 refill in a transfer, she should have 1. She will call [facility] to find out and have them contact us if she does not have any.

## 2019-07-18 ENCOUNTER — Telehealth (INDEPENDENT_AMBULATORY_CARE_PROVIDER_SITE_OTHER): Payer: PPO | Admitting: Family Medicine

## 2019-07-18 ENCOUNTER — Telehealth: Payer: Self-pay | Admitting: Family Medicine

## 2019-07-18 ENCOUNTER — Other Ambulatory Visit: Payer: Self-pay

## 2019-07-18 ENCOUNTER — Encounter: Payer: Self-pay | Admitting: Family Medicine

## 2019-07-18 VITALS — Ht 66.0 in | Wt 200.0 lb

## 2019-07-18 DIAGNOSIS — C099 Malignant neoplasm of tonsil, unspecified: Secondary | ICD-10-CM | POA: Diagnosis not present

## 2019-07-18 DIAGNOSIS — R1310 Dysphagia, unspecified: Secondary | ICD-10-CM | POA: Diagnosis not present

## 2019-07-18 DIAGNOSIS — R05 Cough: Secondary | ICD-10-CM | POA: Diagnosis not present

## 2019-07-18 DIAGNOSIS — T17908A Unspecified foreign body in respiratory tract, part unspecified causing other injury, initial encounter: Secondary | ICD-10-CM | POA: Diagnosis not present

## 2019-07-18 DIAGNOSIS — R059 Cough, unspecified: Secondary | ICD-10-CM

## 2019-07-18 MED ORDER — CLINDAMYCIN HCL 300 MG PO CAPS: 300.0000 mg | ORAL_CAPSULE | Freq: Three times a day (TID) | ORAL | 0 refills | Status: DC

## 2019-07-18 NOTE — Assessment & Plan Note (Signed)
Refer to ST for swallowing study.

## 2019-07-18 NOTE — Assessment & Plan Note (Signed)
Coughing for months with eating and drinking and lying down at nioght in pt with history of tonsil cancer and surgery... concerning for aspiration.

## 2019-07-18 NOTE — Telephone Encounter (Signed)
Please schedule a virtual visit

## 2019-07-18 NOTE — Patient Instructions (Addendum)
Have COVID testing ASAP, just in case.    Start antibiotics.  We will call to set up swallowing study: [place].  If fever, shortness of breath or worsening symptoms... got to ER.

## 2019-07-18 NOTE — Assessment & Plan Note (Signed)
Possible aspiration pneumonia given swallowing dysfunction history of possible aspiration.  pt stable.  Start with clindamycin TID.[name] close follow up in office after negative COVID test performed.. for possible CXR.  Pt with ER precautions if SOB, fever starting.

## 2019-07-18 NOTE — Telephone Encounter (Signed)
Pt c/o cough x 5 days, cough is dry. Denies congestion, SOB, chest tightness or wheezing.  Pt states that her cough is worse when she eats or drinks, pt states that she hears gurgling.   Pt states that she is lethargic - worse than her normal NO energy  Pt is requesting Tussionex cough syrup to [facility]  Allergies  Allergen Reactions  . Amoxicillin Swelling    REACTION: rash, swelling Has patient had a PCN reaction causing immediate rash, facial/tongue/throat swelling, SOB or lightheadedness with hypotension: yes Has patient had a PCN reaction causing severe rash involving mucus membranes or skin necrosis: no Has patient had a PCN reaction that required hospitalization: no Has patient had a PCN reaction occurring within the last 10 years: yes If all of the above answers are "NO", then may proceed with Cephalosporin use.   [name] Fluconazole Rash    REACTION: hives  . Difuleron [Ferrous Fumarate-Dss]   . Other Other (See Comments)    Pt has had tonsil cancer (on Left side) - pt may have difficulty swallowing

## 2019-07-18 NOTE — Progress Notes (Signed)
VIRTUAL VISIT Due to national recommendations of social distancing due to [place] 19, a virtual visit is felt to be most appropriate for this patient at this time.   I connected with the patient on 07/18/19 at 10:40 AM EDT by virtual telehealth platform and verified that I am speaking with the correct person using two identifiers.   Interactive audio and video telecommunications were attempted between this provider and patient, however failed, due to patient having technical difficulties OR patient did not have access to video capability.  We continued and completed visit with audio only.   I discussed the limitations, risks, security and privacy concerns of performing an evaluation and management service by  virtual telehealth platform and the availability of in person appointments. I also discussed with the patient that there may be a patient responsible charge related to this service. The patient expressed understanding and agreed to proceed.  Patient location: Home Provider Location: [facility] Participants: Eliezer Lofts and [name]   Chief Complaint  Patient presents with  . Cough    History of Present Illness: Cough This is a new problem. The current episode started more than 1 month ago (4-5 MONTHS, worse in last few days.[name] everything she eats she coughs. coughing even if not eating, coughing through night). The problem has been gradually worsening. Episode frequency: not every day occassional, noted more with swallowing, chocking with food and liquids. The cough is non-productive. Associated symptoms include wheezing. Pertinent negatives include no chest pain, chills, ear pain, fever, myalgias, nasal congestion, postnasal drip, rhinorrhea, sore throat or shortness of breath. Associated symptoms comments: fatigue. The symptoms are aggravated by lying down (worse with lying down or eating). Risk factors for lung disease include smoking/tobacco exposure (former smoker).  There is no history of asthma, COPD or environmental allergies.   Hx of DM, hx tonsil cancer 10 years ago surgery.. since she has not been able to swallow well.  last swallowing study 5-6 years ago. Lab Results  Component Value Date   HGBA1C 8.0 (H) 06/29/2017     COVID 19 screen No recent travel or known exposure to [place] The importance of social distancing was discussed today.  S/P COVID vaccine  Review of Systems  Constitutional: Negative for chills and fever.  HENT: Negative for ear pain, postnasal drip, rhinorrhea and sore throat.   Respiratory: Positive for cough and wheezing. Negative for shortness of breath.   Cardiovascular: Negative for chest pain.  Musculoskeletal: Negative for myalgias.  Endo/Heme/Allergies: Negative for environmental allergies.      Past Medical History:  Diagnosis Date  . Anemia    iron deficiency and B12 deficiency  . Anxiety   . Breast cancer ([place]) 02/25/2015   upper-outer quadrant of left female breast, Triple Negative. Neo-adjuvant chemo with complete pathologic response.   . Cervical dysplasia    conization  . Diabetes mellitus without complication ([place])   . Diverticulosis   . Fever 04/11/2015  . Gastroparesis    related to previous radiation tx  . GERD (gastroesophageal reflux disease)   . Hyperlipidemia   . Hypertension   . Neuropathy    legs/feet, S/P chemo meds  . Personal history of chemotherapy   . Personal history of radiation therapy 2017   LEFT lumpectomy w/ radiation  . RLS (restless legs syndrome)   . Shingles    Hx of  . 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 Wears dentures    partial bottom    reports that she quit  smoking about 35 years ago. Her smoking use included cigarettes. She has a 3.00 pack-year smoking history. She has never used smokeless tobacco. She reports that she does not drink alcohol or use drugs.   Current Outpatient Medications:  .  albuterol (VENTOLIN HFA) 108 (90 Base) MCG/ACT inhaler, Inhale 2  puffs into the lungs every 6 (six) hours as needed for wheezing or shortness of breath (tight chest)., Disp: 18 g, Rfl: 3 .  ALPRAZolam (XANAX) 0.5 MG tablet, Take 1 tablet (0.5 mg total) by mouth 2 (two) times daily as needed for anxiety or sleep., Disp: 60 tablet, Rfl: 5 .  amitriptyline (ELAVIL) 50 MG tablet, Take 1 tablet (50 mg total) by mouth at bedtime., Disp: 90 tablet, Rfl: 3 .  atorvastatin (LIPITOR) 10 MG tablet, TAKE 1 TABLET(10 MG) BY MOUTH DAILY, Disp: 90 tablet, Rfl: 3 .  b complex vitamins tablet, Take 1 tablet by mouth daily.  , Disp: , Rfl:  .  Cholecalciferol (VITAMIN D-1000 MAX ST) 25 MCG (1000 UT) tablet, Take 2,000 Units by mouth daily. , Disp: , Rfl:  .  glipiZIDE (GLUCOTROL) 10 MG tablet, Take 10 mg by mouth 2 (two) times daily before a meal. , Disp: , Rfl:  .  JANUMET XR 50-1000 MG TB24, Take 1 tablet by mouth 2 (two) times daily. , Disp: , Rfl: 0 .  lidocaine-prilocaine (EMLA) cream, Apply 1 application topically as needed. Apply to port when going through Chemo, Disp: , Rfl:  .  MELOXICAM PO, Take by mouth as needed., Disp: , Rfl:  .  Multiple Vitamin (MULTIVITAMIN) tablet, Take 1 tablet by mouth daily., Disp: , Rfl:  .  Multiple Vitamins-Minerals (WOMENS MULTIVITAMIN PO), Take by mouth., Disp: , Rfl:  .  omeprazole (PRILOSEC) 20 MG capsule, TAKE 1 CAPSULE(20 MG) BY MOUTH AT BEDTIME, Disp: 90 capsule, Rfl: 3 .  pregabalin (LYRICA) 150 MG capsule, Take 1 capsule by mouth 2 (two) times a day., Disp: , Rfl:  .  rOPINIRole (REQUIP) 1 MG tablet, Take 3 tablets (3 mg total) by mouth at bedtime., Disp: 270 tablet, Rfl: 0 .  sertraline (ZOLOFT) 100 MG tablet, TAKE 2 TABLETS BY MOUTH EVERY DAY, Disp: 180 tablet, Rfl: 1 .  traZODone (DESYREL) 100 MG tablet, TAKE 1/2 TO 1 (ONE-HALF TO ONE) TABLET BY MOUTH EVERY DAY AT BEDTIME AS NEEDED FOR INSOMNIA, Disp: 90 tablet, Rfl: 1 No current facility-administered medications for this visit.  Facility-Administered Medications Ordered in  Other Visits:  .  sodium chloride flush (NS) 0.9 % injection 10 mL, 10 mL, Intravenous, PRN, Charlaine Dalton R, MD, 10 mL at 09/11/15 0845   Observations/Objective: Height 5\' 6"  (1.676 m), weight 200 lb (90.7 kg).  Physical Exam  Physical Exam Constitutional:      General: The patient is not in acute distress. Frequent cough Pulmonary:     Effort: Pulmonary effort is normal. No respiratory distress.  Neurological:     Mental Status: The patient is alert and oriented to person, place, and time.  Psychiatric:        Mood and Affect: Mood normal.        Behavior: Behavior normal.   Assessment and Plan Aspiration into airway Coughing for months with eating and drinking and lying down at nioght in pt with history of tonsil cancer and surgery... concerning for aspiration.   Cough Possible aspiration pneumonia given swallowing dysfunction history of possible aspiration.  pt stable.  Start with clindamycin TID.[name] close follow up in office after  negative COVID test performed.. for possible CXR.  Pt with ER precautions if SOB, fever starting.  Swallowing dysfunction Refer to ST for swallowing study.  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[place]) History of  S/P treatment, not active but likly cause of  swallowing dysfunction.  Total visit time 20 minutes, > 50% spent counseling and coordinating patients care.    I discussed the assessment and treatment plan with the patient. The patient was provided an opportunity to ask questions and all were answered. The patient agreed with the plan and demonstrated an understanding of the instructions.   The patient was advised to call back or seek an in-person evaluation if the symptoms worsen or if the condition fails to improve as anticipated.     Eliezer Lofts, MD

## 2019-07-18 NOTE — Telephone Encounter (Signed)
Patient scheduled virtual visit with Dr.Bedsole at 10:40.

## 2019-07-18 NOTE — Progress Notes (Signed)
Thanks for seeing her-that sounds great

## 2019-07-18 NOTE — Assessment & Plan Note (Addendum)
S/P treatment, not active but likly cause of  swallowing dysfunction.

## 2019-07-19 ENCOUNTER — Telehealth: Payer: Self-pay | Admitting: Family Medicine

## 2019-07-19 ENCOUNTER — Ambulatory Visit: Payer: PPO | Attending: Internal Medicine

## 2019-07-19 DIAGNOSIS — Z20822 Contact with and (suspected) exposure to covid-19: Secondary | ICD-10-CM | POA: Diagnosis not present

## 2019-07-19 DIAGNOSIS — R1310 Dysphagia, unspecified: Secondary | ICD-10-CM

## 2019-07-19 DIAGNOSIS — R059 Cough, unspecified: Secondary | ICD-10-CM

## 2019-07-19 DIAGNOSIS — T17908A Unspecified foreign body in respiratory tract, part unspecified causing other injury, initial encounter: Secondary | ICD-10-CM

## 2019-07-19 DIAGNOSIS — C099 Malignant neoplasm of tonsil, unspecified: Secondary | ICD-10-CM

## 2019-07-19 NOTE — Telephone Encounter (Signed)
Please clarify that you want a Modified Barium swallow study test. If you do then the order needs to be changed to [id number] and choose [facility] as the location. The patient has a Medicare plan so it has to ordered this way. Call [phone] Specials Dept to schedule when order is placed.

## 2019-07-20 LAB — NOVEL CORONAVIRUS, NAA: SARS-CoV-2, NAA: NOT DETECTED

## 2019-07-20 LAB — SARS-COV-2, NAA 2 DAY TAT

## 2019-07-21 NOTE — Telephone Encounter (Signed)
Patient is scheduled for 07/27/19 at 12:30 and has been notified.

## 2019-07-21 NOTE — Telephone Encounter (Signed)
I have changed the order. Please schedule for patient ASAP.

## 2019-07-25 DIAGNOSIS — E119 Type 2 diabetes mellitus without complications: Secondary | ICD-10-CM | POA: Diagnosis not present

## 2019-07-27 ENCOUNTER — Telehealth: Payer: Self-pay

## 2019-07-27 ENCOUNTER — Ambulatory Visit
Admission: RE | Admit: 2019-07-27 | Discharge: 2019-07-27 | Disposition: A | Discharge status: Home / Self Care | Payer: PPO | Source: Ambulatory Visit | Attending: Family Medicine | Admitting: Family Medicine

## 2019-07-27 ENCOUNTER — Other Ambulatory Visit: Payer: Self-pay

## 2019-07-27 DIAGNOSIS — C099 Malignant neoplasm of tonsil, unspecified: Secondary | ICD-10-CM

## 2019-07-27 DIAGNOSIS — R1312 Dysphagia, oropharyngeal phase: Secondary | ICD-10-CM

## 2019-07-27 DIAGNOSIS — R059 Cough, unspecified: Secondary | ICD-10-CM

## 2019-07-27 DIAGNOSIS — R1310 Dysphagia, unspecified: Secondary | ICD-10-CM

## 2019-07-27 DIAGNOSIS — R05 Cough: Secondary | ICD-10-CM | POA: Insufficient documentation

## 2019-07-27 DIAGNOSIS — T17908A Unspecified foreign body in respiratory tract, part unspecified causing other injury, initial encounter: Secondary | ICD-10-CM

## 2019-07-27 NOTE — Telephone Encounter (Signed)
Suzanne Strong with [facility] radiology called report on swallow study medicare speech path. Report is in Epic and copy of report taken to Dr Rometta Emery area.

## 2019-07-27 NOTE — Therapy (Signed)
[place], [place], [address] Phone: [phone]   Fax:     Modified Barium Swallow  Patient Details  Name: Suzanne Strong MRN: [id number] Date of Birth: 11/12/1945 No data recorded  Encounter Date: 07/27/2019   End of Session - 07/27/19 1832    Visit Number 1    Number of Visits 1    Date for SLP Re-Evaluation 07/27/19    SLP Start Time 1255    SLP Stop Time  1400    SLP Time Calculation (min) 65 min    Activity Tolerance Patient tolerated treatment well           Past Medical History:  Diagnosis Date  . Anemia    iron deficiency and B12 deficiency  . Anxiety   . Breast cancer ([place]) 02/25/2015   upper-outer quadrant of left female breast, Triple Negative. Neo-adjuvant chemo with complete pathologic response.   . Cervical dysplasia    conization  . Diabetes mellitus without complication ([place])   . Diverticulosis   . Fever 04/11/2015  . Gastroparesis    related to previous radiation tx  . GERD (gastroesophageal reflux disease)   . Hyperlipidemia   . Hypertension   . Neuropathy    legs/feet, S/P chemo meds  . Personal history of chemotherapy   . Personal history of radiation therapy 2017   LEFT lumpectomy w/ radiation  . RLS (restless legs syndrome)   . Shingles    Hx of  . 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 Wears dentures    partial bottom    Past Surgical History:  Procedure Laterality Date  . APPENDECTOMY    . BREAST BIOPSY Left 02/25/2015   INVASIVE MAMMARY CARCINOMA,Triple negative.  [name] BREAST CYST ASPIRATION    . BREAST EXCISIONAL BIOPSY Left 12/2015   surgery  . BREAST LUMPECTOMY WITH NEEDLE LOCALIZATION Left 10/02/2015   Procedure: BREAST LUMPECTOMY WITH NEEDLE LOCALIZATION;  Surgeon: [name], MD;  Location: [facility];  Service: General;  Laterality: Left;  . BREAST LUMPECTOMY WITH SENTINEL LYMPH NODE BIOPSY Left 10/02/2015   Procedure: LEFT BREAST WIDE  EXCISION WITH SENTINEL LYMPH NODE BX;  Surgeon: [name], MD;  Location: [facility] OR[facility];  Service: General;  Laterality: Left;  . BREAST SURGERY     breast biopsy benign  . CARPAL TUNNEL RELEASE Right 05/19/2017   Procedure: CARPAL TUNNEL RELEASE;  Surgeon: Earnestine Leys, MD;  Location: [facility] OR[facility];  Service: Orthopedics;  Laterality: Right;  . CATARACT EXTRACTION W/PHACO Right 10/26/2018   Procedure: CATARACT EXTRACTION PHACO AND INTRAOCULAR LENS PLACEMENT (Interior) right diabetic;  Surgeon: Leandrew Koyanagi, MD;  Location: [place];  Service: Ophthalmology;  Laterality: Right;  Diabetic - oral meds cancer center been notified and will come  . CATARACT EXTRACTION W/PHACO Left 11/16/2018   Procedure: CATARACT EXTRACTION PHACO AND INTRAOCULAR LENS PLACEMENT (IOC) LEFT DIABETIC  01:01.0  17.0%  10.37;  Surgeon: Leandrew Koyanagi, MD;  Location: [place];  Service: Ophthalmology;  Laterality: Left;  Diabetic - oral meds Port-a-cath  . CHOLECYSTECTOMY    . Cologuard  07/22/2016   Negative  . ESOPHAGOGASTRODUODENOSCOPY  07/2009   normal with few gastric polyps  . MOLE REMOVAL    . PORT-A-CATH REMOVAL    . PORTACATH PLACEMENT    . PORTACATH PLACEMENT Right 03/12/2015   Procedure: INSERTION PORT-A-CATH;  Surgeon: [name], MD;  Location: [facility];  Service: General;  Laterality: Right;  . RADICAL  NECK DISSECTION    . TONSILLECTOMY     cancer treated with chemo and radiation  . TRIGGER FINGER RELEASE Right 05/19/2017   Procedure: RELEASE TRIGGER FINGER/A-1 PULLEY ring finger;  Surgeon: Earnestine Leys, MD;  Location: [facility];  Service: Orthopedics;  Laterality: Right;    There were no vitals filed for this visit.      Subjective: Patient behavior: (alertness, ability to follow instructions, etc.): pt has h/o R Tonsilar CA w/ Resection and XRT ~10 years ago. GERD on PPI. Pt endorsed in the last 4-5 months "more" Hoarseness episodes, Coughing with and without po's,  and Coughing at nighttime. She endorsed "bronchitis" episodes in which MD gives her an antibiotic. She denied CVA, no recent pneumonia. She has not changed her diet "except to cut foods small for easier chewing"; has not lost weight recently. She swallows her Pills w/ a Puree, "especially larger pills".  Chief complaint: dysphagia. She Cuts foods small and Carefully masticates foods b/f swallowing - ongoing for 10 years since CA. She alternates w/ sips of liquid to aid clearing - this has increased in recent months. She stated she feels the food/liquid "sometimes go down the wrong way". She has min reduced lingual ROM, strength post XRT baseline; min slower movement noted during OM exam. Native Dentition. Hoarse vocal quality present today.   Objective:  Radiological Procedure: A videoflouroscopic evaluation of oral-preparatory, reflex initiation, and pharyngeal phases of the swallow was performed; as well as a screening of the upper esophageal phase.  I. POSTURE: upright II. VIEW: lateral III. COMPENSATORY STRATEGIES: chin tuck; Dry swallows; alternating food/liquid; effortful swallow; cough to clear IV. BOLUSES ADMINISTERED:  Thin Liquid: 5 trials  Nectar-thick Liquid: 3 trials  Honey-thick Liquid: NT  Puree: 4 trials  Mechanical Soft: 2 trials V. RESULTS OF EVALUATION: A. ORAL PREPARATORY PHASE: (The lips, tongue, and velum are observed for strength and coordination)       **Overall Severity Rating: MILD-MOD. Decreased bolus control of thin liquids w/ premature spillage into the pharynx. Min more oral phase time for full mastication of small, moist boluses - adequate lingual control and sweeping to clear w/ f/u swallow.  B. SWALLOW INITIATION/REFLEX: (The reflex is normal if "triggered" by the time the bolus reached the base of the tongue)  **Overall Severity Rating: MOD. Delayed pharyngeal swallow initiation w/ thin liquids spilling to, and filling, the Pyriform Sinuses. Aspiration (SILENT)  was noted w/ thin liquids; Laryngeal Penetration (SILENT) noted w/ thin and nectar liquids(inconsistently) d/t reduced, tight airway closure during the swallow. Of note, pt exhibited a hesitation during the swallow w/ increased texture trials(puree/mech soft) - bolus trial hesitated in the mid-pharynx b/f pt completed the swallow.  C. PHARYNGEAL PHASE: (Pharyngeal function is normal if the bolus shows rapid, smooth, and continuous transit through the pharynx and there is no pharyngeal residue after the swallow)  **Overall Severity Rating: MOD. Residue remained in the pharynx post initial swallow - valleculae, pyriform sinuses. Of note, bolus trials of increased texture appeared "cut off" as they entered the UES - impaired timing and patency of UES for full bolus clearance during the swallow. The increased pharyngeal residue only reduced in amount w/ f/u, Dry swallows(and despite other swallowing strategies).  D. LARYNGEAL PENETRATION: (Material entering into the laryngeal inlet/vestibule but not aspirated): thin liquids: 1/5 trials; nectar liquids: 1/3 trials E. ASPIRATION: thin liquids: 3/5 trials (SILENT) F. 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 Aspiration (SILENT) of thin liquids was noted w/ majority of trials; less prevalent w/ nectar consistency liquids. During the oral phase, decreased bolus control of thin liquids w/ premature spillage into the pharynx. Min more oral phase time for full mastication of small, moist boluses - adequate lingual control and sweeping to clear w/ f/u swallow. During the pharyngeal phase, delayed pharyngeal swallow initiation w/ thin liquids spilling to, and  filling, the Pyriform Sinuses. Aspiration (SILENT) was noted w/ thin liquids; Laryngeal Penetration (SILENT) noted w/ thin and nectar liquids(inconsistently) d/t reduced, tight airway closure during the swallow. Reduced Epiglottic inversion noted - suspect impact from late-effects of XRT, and resection. Of note, pt exhibited a hesitation during the swallow w/ increased texture trials(puree/mech soft) - bolus trial hesitated in the mid-pharynx b/f pt completed the swallow. Residue remained in the pharynx post initial swallow - valleculae, pyriform sinuses, pharyngeal wall. Laryngeal excursion noted though decreased Epiglottic inversion; min reduced Anterior movement. Min decreased pharyngeal pressure during the swallow - suspect decreased base of tongue strength/contact secondary to late-effects of XRT. A f/u, Dry swallow appeared to reduce this residue somewhat. Other strategies were not helpful. Of note, bolus trials of increased texture appeared "cut off" as they entered the UES - impaired timing and patency of UES for full bolus clearance during the swallow. The increased pharyngeal residue only reduced in amount w/ f/u, Dry swallows(and despite other swallowing strategies). 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  Please see Radiologist's note re: thickening of the Hypopharynx. Recommend discussion w/ PCP re: these results and recommendations; referral to Outpatient ST services for Dysphagia tx/management/education.  PLAN/RECOMMENDATIONS:  A. Diet: mech soft diet w/ Minced meats, moistened foods; consider use of Nectar consistency liquids including use of cream soups in diet  B. Swallowing Precautions: aspiration precautions; Reflux precautions; swallowing strategies to include: f/u, Dry swallows, effortful swallow,  alternating food/liquid to aid clearing; monitoring of Pulmonary status for negative sequelae  C. Recommended consultation to: ENT for direct viewing of pharynx, UES. GI for direct viewing of cervical esophagus re: CP Bar, dysmotility and hopeful treatment to address suspected tightness  D. Therapy recommendations: recommend pt receive Outpatient skilled ST services for Dysphagia; education and implementation of swallowing strategies and exercises; diet consistency recommendations and education  E. Results and recommendations were discussed w/ pt; video viewed and questions answered; aspiration precautions practiced. Contact information was given to make Outpatient appt.          Patient will benefit from skilled therapeutic intervention in order to improve the following deficits and impairments:   Dysphagia, oropharyngeal phase  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[place]) - Plan: DG SWALLOW STUDY OP MEDICARE SPEECH PATH, DG SWALLOW STUDY OP MEDICARE SPEECH PATH  Cough - Plan: DG SWALLOW STUDY OP MEDICARE SPEECH PATH, DG SWALLOW STUDY OP MEDICARE SPEECH PATH  Swallowing dysfunction - Plan: DG SWALLOW STUDY OP MEDICARE SPEECH PATH, DG SWALLOW STUDY OP MEDICARE SPEECH PATH  Aspiration into airway, initial encounter - Plan: DG SWALLOW STUDY OP MEDICARE SPEECH PATH, DG SWALLOW STUDY OP MEDICARE SPEECH PATH        Problem List Patient Active Problem List   Diagnosis Date  Noted  . Swallowing dysfunction 07/18/2019  . Aspiration into airway 07/18/2019  . Cough 02/21/2019  . Fever 09/01/2018  . Pre-syncope 02/07/2018  . Orthostatic hypotension 01/26/2018  . Episodic weakness 01/26/2018  . Medicare annual wellness visit, subsequent 06/29/2017  . 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 Preoperative examination 04/27/2017  . Carpal tunnel syndrome of right wrist 11/13/2016  . 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[place]) History of  02/17/2016  . Obstructive sleep apnea 01/01/2016  . Carcinoma of upper-outer  quadrant of left breast in female, estrogen receptor negative ([place]) 11/21/2015  . Hypomagnesemia 08/28/2015  . Initial Medicare annual wellness visit 10/16/2014  . Estrogen deficiency 10/16/2014  . Colon cancer screening 10/16/2014  . Controlled type 2 diabetes mellitus without complication ([place]) 53/66/4403  . RLS (restless legs syndrome) 02/21/2014  . Chronic neck pain 07/25/2010  . Depression 07/07/2010  . Routine general medical examination at a health care facility 06/26/2010  . B12 deficiency 09/04/2009  . Anemia, iron deficiency 08/29/2009  . Anxiety state 08/26/2009  . GASTROPARESIS 08/26/2009  . Vitamin D deficiency 07/19/2008  . Disorder of bone and cartilage 07/19/2007  . Hyperlipidemia 07/18/2007  . EDEMA 07/18/2007  . Skin lesion, superficial 06/02/2007      Orinda Kenner, MS, CCC-SLP [place] 07/27/2019, 6:33 PM  [facility] [place], [place], [address] Phone: [phone]   Fax:     Name: Suzanne Strong MRN: [id number] Date of Birth: 1945-07-18

## 2019-07-27 NOTE — Telephone Encounter (Signed)
There are some thickened areas causing aspiration (? New vs scar tissue) and also CS changes that may interfere  I want to get her back to ENT if agreeable Who has she seen most recently?

## 2019-07-28 NOTE — Telephone Encounter (Signed)
Pt notified of swallow study results and Dr. Marliss Coots comments. Pt said that she use to see Dr. Nadeen Landau but he moved to [facility]. Pt said if Dr. Carlis Abbott is still seeing pt's she would like to see him because he knows her history, she is willing to drive to [facility] to see him but if that's not an option she said is okay seeing whoever replaced him at [facility], I advised pt our [facility] will call to schedule appt with her

## 2019-07-28 NOTE — Telephone Encounter (Signed)
Urgent ref to [facility]   Let me know how it goes please

## 2019-07-31 DIAGNOSIS — R682 Dry mouth, unspecified: Secondary | ICD-10-CM | POA: Diagnosis not present

## 2019-07-31 DIAGNOSIS — J9589 Other postprocedural complications and disorders of respiratory system, not elsewhere classified: Secondary | ICD-10-CM | POA: Diagnosis not present

## 2019-07-31 DIAGNOSIS — Z85818 Personal history of malignant neoplasm of other sites of lip, oral cavity, and pharynx: Secondary | ICD-10-CM | POA: Diagnosis not present

## 2019-07-31 NOTE — Telephone Encounter (Signed)
Patient will see Dr Tami Ribas today 07/31/19 at 3:45pm.

## 2019-08-01 ENCOUNTER — Telehealth: Payer: Self-pay | Admitting: Family Medicine

## 2019-08-01 DIAGNOSIS — E119 Type 2 diabetes mellitus without complications: Secondary | ICD-10-CM | POA: Diagnosis not present

## 2019-08-01 DIAGNOSIS — G2581 Restless legs syndrome: Secondary | ICD-10-CM | POA: Diagnosis not present

## 2019-08-01 NOTE — Telephone Encounter (Signed)
Please let pt know that I reviewed note from her visit with Dr Tami Ribas (ENT) and findings are very reassuring   Noted that her scope was clear  ? If the mouth dryness caused swallowing issues   How is she feeling? How is cough and how is her swallowing?

## 2019-08-01 NOTE — Telephone Encounter (Signed)
Called pt and advised her of Dr. Marliss Coots comments. Pt said that she is feeling about the same. She said the coughing is a little better but she is learning what foods to avoid and if she eats a trigger food she will have swallowing issues and then that's when she is coughing more. Pt said she is still having the swallowing issues every now and then but again she is learning what to foods to avoid to help her not aspirate as much.

## 2019-08-01 NOTE — Telephone Encounter (Signed)
I pended a ref to speech tx to help with recommendations for swallowing problem to avoid further aspiration  Does she pref [place] or [place]?

## 2019-08-02 NOTE — Telephone Encounter (Signed)
Thanks for clarifying - I'm not sure what the classes are so I may need more details.  She can probably just call the speech path office and get inst from them re: this    (most likely will not need referral since they were sending her to begin with)   Please keep me posted re progress and I will watch for notes as well   If not already done-can schedule f/u with me in office in the next month or so to check in

## 2019-08-02 NOTE — Telephone Encounter (Signed)
Pt advised of Dr. Marliss Coots comments. Pt wanted to make sure that Dr. Glori Bickers knew that she had already seen Speech Therapy. She had an initial eval with them they gave her guidelines to go by and advised her she should f/u with them to takes some "classes" at [facility]. Pt said she never followed through she just went to the ENT 1st. Pt said she's okay with Dr. Glori Bickers putting a referral in if she can go back to the same place, not sure what's it's called but she said the classes were going to be at [facility] hospital grounds

## 2019-08-02 NOTE — Telephone Encounter (Signed)
Pt notified of Dr. Marliss Coots comments. Pt said she can call [facility] directly and make an appt. F/u appt scheduled with PCP

## 2019-08-22 ENCOUNTER — Ambulatory Visit: Payer: Self-pay

## 2019-08-31 ENCOUNTER — Telehealth: Payer: PPO

## 2019-09-12 ENCOUNTER — Other Ambulatory Visit: Payer: Self-pay

## 2019-09-14 ENCOUNTER — Telehealth: Payer: Self-pay

## 2019-09-14 NOTE — Progress Notes (Addendum)
Reviewed chart for medication changes ahead of medication coordination call.  [name] PCP 09/18/19 - ropinirole dose increased, take an extra  of 1 mg at lunch time if needed - new rx sent to [facility] . Psychiatry 09/20/19 - started on buspirone 15 mg - as needed, [facility]   BP Readings from Last 3 Encounters:  09/18/19 (!) 142/84  05/18/19 (!) 139/58  04/20/19 128/76    Lab Results  Component Value Date   HGBA1C 8.0 (H) 06/29/2017    Per endocrinology, A1c 08/01/19 6.9%  Patient obtains medications through Adherence Packaging  30 Days   Last adherence delivery included: (delivered 08/25/19) Packs, 30 DS . Omeprazole 20 mg - 1 bedtime  . Amitriptyline 50 mg - 1 bedtime  . Ropinirole 1 mg - 3 bedtime  . Pregabalin 150 mg - 1 breakfast, 1 bedtime . Centrum Silver Multivitamin - 1 breakfast . Vitamin B-Complex - 1 breakfast . Alpha lipoic acid 200 mg - 1 bedtime . Vitamin D3 50 mcg - 1 breakfast, 1 bedtime Vials, 30 DS . Trazodone and Xanax delivered 08/23/19  Patient declined (meds) last month due to PRN use/additional supply on hand: . Albuterol (has adequate supply, uses about 2-3x/week) . Janumet XR 50/1000 mg - 1 breakfast, 1 evening meal (As of 7/6 has about 45 DS left)  . Atorvastatin 10 mg - 1 breakfast (As of 7/6 has about 30 days left) . Sertraline 100 mg - 2 breakfast (As of 7/6 has about 30 days left) . Glipizide 10 mg - 1 breakfast, 1 evening meal (As of 7/6 has 90 DS left)    Patient is due for next adherence delivery on: 09/22/19 Called patient and reviewed medications and coordinated delivery.  This delivery to include: Packs, 30 DS . Omeprazole 20 mg - 1 bedtime  . Amitriptyline 50 mg - 1 bedtime  . Ropinirole 1 mg -  tablet breakfast, 3 bedtime . Pregabalin 150 mg - 1 breakfast, 1 bedtime . Vitamin B-Complex - 1 breakfast . Alpha lipoic acid 200 mg - 1 bedtime  . Janumet XR 50/1000 mg - 1 breakfast, 1 evening meal . Sertraline 100 mg - 2  breakfast . Trazodone 100 mg - 1 bedtime  Vials: . Xanax 0.5 mg - 30 DS . Albuterol - 30 DS . Buspirone (new med would like vial this month to try it out) - 30 DS  Patient declined the following medications: . Atorvastatin 10 mg - 1 breakfast (As of 8/4 has about 65 days left, confirms she is still taking every day, has not filled elsewhere) . Glipizide 10 mg - 1 breakfast, 1 evening meal (As of 8/4 has 60 DS left, confirmed she is still taking every day, has not filled elsewhere) . Centrum Silver Multivitamin - 1 breakfast (pt has an energy metabolism and bone support vitamin she is taking now instead, will let us know when she would like the energy supplement added to packs) . Vitamin D3 50 mcg - 1 breakfast, 1 bedtime (pt has some of this at home and wanted to finish out, then add back to packs)  No refills needed prior to delivery  Confirmed delivery date of 09/22/19, advised patient that pharmacy will contact them the morning of delivery.  Martinique Uselman, [place] Clinical Pharmacist Assistant  [phone]  [name], PharmD Clinical Pharmacist [facility] at [facility] [phone]  Time spent: 1:30:00  (30 minutes chart review 7/29, 1 hour call completion and documentation 8/3)

## 2019-09-18 ENCOUNTER — Ambulatory Visit (INDEPENDENT_AMBULATORY_CARE_PROVIDER_SITE_OTHER): Payer: PPO | Admitting: Family Medicine

## 2019-09-18 ENCOUNTER — Encounter: Payer: Self-pay | Admitting: Family Medicine

## 2019-09-18 ENCOUNTER — Other Ambulatory Visit: Payer: Self-pay

## 2019-09-18 VITALS — BP 142/84 | HR 74 | Temp 97.0°F | Ht 66.0 in | Wt 220.3 lb

## 2019-09-18 DIAGNOSIS — G2581 Restless legs syndrome: Secondary | ICD-10-CM

## 2019-09-18 DIAGNOSIS — E538 Deficiency of other specified B group vitamins: Secondary | ICD-10-CM | POA: Diagnosis not present

## 2019-09-18 DIAGNOSIS — E119 Type 2 diabetes mellitus without complications: Secondary | ICD-10-CM

## 2019-09-18 DIAGNOSIS — R55 Syncope and collapse: Secondary | ICD-10-CM | POA: Diagnosis not present

## 2019-09-18 DIAGNOSIS — R1310 Dysphagia, unspecified: Secondary | ICD-10-CM

## 2019-09-18 DIAGNOSIS — D509 Iron deficiency anemia, unspecified: Secondary | ICD-10-CM

## 2019-09-18 DIAGNOSIS — C801 Malignant (primary) neoplasm, unspecified: Secondary | ICD-10-CM

## 2019-09-18 DIAGNOSIS — G63 Polyneuropathy in diseases classified elsewhere: Secondary | ICD-10-CM | POA: Diagnosis not present

## 2019-09-18 DIAGNOSIS — F4321 Adjustment disorder with depressed mood: Secondary | ICD-10-CM

## 2019-09-18 LAB — CBC WITH DIFFERENTIAL/PLATELET
Basophils Absolute: 0 10*3/uL (ref 0.0–0.1)
Basophils Relative: 0.5 % (ref 0.0–3.0)
Eosinophils Absolute: 0.2 10*3/uL (ref 0.0–0.7)
Eosinophils Relative: 3.3 % (ref 0.0–5.0)
HCT: 34.1 % — ABNORMAL LOW (ref 36.0–46.0)
Hemoglobin: 10.9 g/dL — ABNORMAL LOW (ref 12.0–15.0)
Lymphocytes Relative: 15.8 % (ref 12.0–46.0)
Lymphs Abs: 0.9 10*3/uL (ref 0.7–4.0)
MCHC: 32.1 g/dL (ref 30.0–36.0)
MCV: 87.2 fl (ref 78.0–100.0)
Monocytes Absolute: 0.4 10*3/uL (ref 0.1–1.0)
Monocytes Relative: 6.7 % (ref 3.0–12.0)
Neutro Abs: 4.1 10*3/uL (ref 1.4–7.7)
Neutrophils Relative %: 73.7 % (ref 43.0–77.0)
Platelets: 206 10*3/uL (ref 150.0–400.0)
RBC: 3.91 Mil/uL (ref 3.87–5.11)
RDW: 16.3 % — ABNORMAL HIGH (ref 11.5–15.5)
WBC: 5.6 10*3/uL (ref 4.0–10.5)

## 2019-09-18 LAB — VITAMIN B12: Vitamin B-12: 555 pg/mL (ref 211–911)

## 2019-09-18 MED ORDER — ROPINIROLE HCL 1 MG PO TABS: ORAL_TABLET | ORAL | 0 refills | Status: DC

## 2019-09-18 NOTE — Patient Instructions (Addendum)
Continue your requip 3 mg at night  Take 1/2 of the 1 mg pill at lunch time if needed   Since restless legs got worse- we need to make sure you are not more anemic Make note of how much iron you take -will discuss when we call with results   I wrote a letter for jury duty   Go forward with counseling for grief and try to take care of yourself

## 2019-09-18 NOTE — Progress Notes (Signed)
Subjective:    Patient ID: Suzanne Strong, female    DOB: 07/05/1945, 74 y.o.   MRN: [id number]  This visit occurred during the SARS-CoV-2 public health emergency.  Safety protocols were in place, including screening questions prior to the visit, additional usage of staff PPE, and extensive cleaning of exam room while observing appropriate contact time as indicated for disinfecting solutions.    HPI Pt presents for speech/swallowing issues as well as RLS and need for letter for ury duty   Wt Readings from Last 3 Encounters:  09/18/19 (!) 220 lb 5 oz (99.9 kg)  07/18/19 200 lb (90.7 kg)  05/18/19 227 lb 12.8 oz (103.3 kg)   35.56 kg/m   She had a swallowing study 6/21 IMPRESSION: 1. Thickening of the hypopharynx with significant residue noted at the pharyngo esophageal junction and suggestion of lesion in the area just above the cords. While this could relate to post radiation changes direct visualization of the larynx and upper esophagus may be helpful to exclude underlying mass. 2. Silent aspiration as discussed. Please see speech pathology report for further details. 3. Dense sclerosis, potentially all related to degenerative change but with more robust sclerosis at the level of C4-5. Worse than on the exam of 2017. Dedicated spinal imaging may be helpful. Dense sclerosis and degenerative process noted on previous imaging though worse than on previous exam.  She then saw ENT: Dr Tami Ribas for eval Laryngoscope was normal  Problem thought to be due to dry mouth from past XRT  She went to speech therapy to work on things -made a plan for this    Right now- has not made any changes  Some things are hard for her to swallow-she has to avoid/prepare for it  ? If she gets enough fluids (difficult with her schedule)  Does not choke on fluids   Bread and beef - has to avoid    She had her covid vaccines   She lost her husband to an car accident- heart related  This  has been tough  Going to [facility] this week for counseling  Was married 73 years No children  Has to make herself get up and go  Has good support in friends /family as well  Work has been great about it    Takes requip 3 mg at bedtime for RLS She used to only have trouble at night-then found herself needing to take medicine earlier   Would like to try a dose around lunchtime  Cannot stay still when sitting  Legs and arms jerk   Has a h/o anemia Lab Results  Component Value Date   WBC 5.1 04/20/2019   HGB 10.3 (L) 04/20/2019   HCT 33.4 (L) 04/20/2019   MCV 91.0 04/20/2019   PLT 184 04/20/2019   Lab Results  Component Value Date   FERRITIN 46.7 07/15/2018   Still taking iron  Unsure of dose- gets it in [facility]  Oncology is aware -has appt in the fall    Also elavil 50 mg at night  Also trazodone  And zoloft  Xanax prn   A1C is below 7 Doing well   Cannot sit for jury duty due to her RLS  Cannot sit for more than 60 minutes  Also needs to eat regularly with diabetes  Still has weak spells  No falls Worse with stress  Patient Active Problem List   Diagnosis Date Noted  . Grief reaction 09/18/2019  . Swallowing dysfunction 07/18/2019  .  Cough 02/21/2019  . Pre-syncope 02/07/2018  . Orthostatic hypotension 01/26/2018  . Episodic weakness 01/26/2018  . Medicare annual wellness visit, subsequent 06/29/2017  . Neuropathy associated with cancer ([place]) 06/16/2017  . Preoperative examination 04/27/2017  . Carpal tunnel syndrome of right wrist 11/13/2016  . 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 Obstructive sleep apnea 01/01/2016  . Carcinoma of upper-outer quadrant of left breast in female, estrogen receptor negative ([place]) 11/21/2015  . Hypomagnesemia 08/28/2015  . Initial Medicare annual wellness visit 10/16/2014  . Estrogen deficiency 10/16/2014  . Colon cancer screening 10/16/2014  . Controlled type 2 diabetes mellitus without  complication ([place]) 63/84/6659  . RLS (restless legs syndrome) 02/21/2014  . Chronic neck pain 07/25/2010  . Depression 07/07/2010  . Routine general medical examination at a health care facility 06/26/2010  . B12 deficiency 09/04/2009  . Anemia, iron deficiency 08/29/2009  . Anxiety state 08/26/2009  . GASTROPARESIS 08/26/2009  . Vitamin D deficiency 07/19/2008  . Disorder of bone and cartilage 07/19/2007  . Hyperlipidemia 07/18/2007  . EDEMA 07/18/2007  . Skin lesion, superficial 06/02/2007   Past Medical History:  Diagnosis Date  . Anemia    iron deficiency and B12 deficiency  . Anxiety   . Breast cancer ([place]) 02/25/2015   upper-outer quadrant of left female breast, Triple Negative. Neo-adjuvant chemo with complete pathologic response.   . Cervical dysplasia    conization  . Diabetes mellitus without complication ([place])   . Diverticulosis   . Fever 04/11/2015  . Gastroparesis    related to previous radiation tx  . GERD (gastroesophageal reflux disease)   . Hyperlipidemia   . Hypertension   . Neuropathy    legs/feet, S/P chemo meds  . Personal history of chemotherapy   . Personal history of radiation therapy 2017   LEFT lumpectomy w/ radiation  . RLS (restless legs syndrome)   . Shingles    Hx of  . 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 Wears dentures    partial bottom   Past Surgical History:  Procedure Laterality Date  . APPENDECTOMY    . BREAST BIOPSY Left 02/25/2015   INVASIVE MAMMARY CARCINOMA,Triple negative.  [name] BREAST CYST ASPIRATION    . BREAST EXCISIONAL BIOPSY Left 12/2015   surgery  . BREAST LUMPECTOMY WITH NEEDLE LOCALIZATION Left 10/02/2015   Procedure: BREAST LUMPECTOMY WITH NEEDLE LOCALIZATION;  Surgeon: [name], MD;  Location: [facility];  Service: General;  Laterality: Left;  . BREAST LUMPECTOMY WITH SENTINEL LYMPH NODE BIOPSY Left 10/02/2015   Procedure: LEFT BREAST WIDE EXCISION WITH SENTINEL LYMPH NODE BX;  Surgeon: [name], MD;   Location: [facility];  Service: General;  Laterality: Left;  . BREAST SURGERY     breast biopsy benign  . CARPAL TUNNEL RELEASE Right 05/19/2017   Procedure: CARPAL TUNNEL RELEASE;  Surgeon: Earnestine Leys, MD;  Location: [facility];  Service: Orthopedics;  Laterality: Right;  . CATARACT EXTRACTION W/PHACO Right 10/26/2018   Procedure: CATARACT EXTRACTION PHACO AND INTRAOCULAR LENS PLACEMENT (Onset) right diabetic;  Surgeon: Leandrew Koyanagi, MD;  Location: [place];  Service: Ophthalmology;  Laterality: Right;  Diabetic - oral meds cancer center been notified and will come  . CATARACT EXTRACTION W/PHACO Left 11/16/2018   Procedure: CATARACT EXTRACTION PHACO AND INTRAOCULAR LENS PLACEMENT (IOC) LEFT DIABETIC  01:01.0  17.0%  10.37;  Surgeon: Leandrew Koyanagi, MD;  Location: [place];  Service: Ophthalmology;  Laterality: Left;  Diabetic - oral meds Port-a-cath  . CHOLECYSTECTOMY    .  Cologuard  07/22/2016   Negative  . ESOPHAGOGASTRODUODENOSCOPY  07/2009   normal with few gastric polyps  . MOLE REMOVAL    . PORT-A-CATH REMOVAL    . PORTACATH PLACEMENT    . PORTACATH PLACEMENT Right 03/12/2015   Procedure: INSERTION PORT-A-CATH;  Surgeon: [name], MD;  Location: [facility];  Service: General;  Laterality: Right;  . RADICAL NECK DISSECTION    . TONSILLECTOMY     cancer treated with chemo and radiation  . TRIGGER FINGER RELEASE Right 05/19/2017   Procedure: RELEASE TRIGGER FINGER/A-1 PULLEY ring finger;  Surgeon: Earnestine Leys, MD;  Location: [facility];  Service: Orthopedics;  Laterality: Right;   Social History   Tobacco Use  . Smoking status: Former Smoker    Packs/day: 0.50    Years: 6.00    Pack years: 3.00    Types: Cigarettes    Quit date: 03/10/1984    Years since quitting: 35.5  . Smokeless tobacco: Never Used  Vaping Use  . Vaping Use: Never used  Substance Use Topics  . Alcohol use: No    Alcohol/week: 0.0 standard drinks  . Drug use: No    Family History  Problem Relation Age of Onset  . Osteoporosis Mother   . Hyperlipidemia Mother   . Depression Mother   . Cancer Mother        skin cancer ? basal cell and lung ca heavy smoker  . Alcohol abuse Father   . Cancer Father        skin CA ? basal cell  . Heart disease Father        CHF  . Hyperlipidemia Brother   . Hypertension Brother   . Breast cancer Neg Hx    Allergies  Allergen Reactions  . Amoxicillin Swelling    REACTION: rash, swelling Has patient had a PCN reaction causing immediate rash, facial/tongue/throat swelling, SOB or lightheadedness with hypotension: yes Has patient had a PCN reaction causing severe rash involving mucus membranes or skin necrosis: no Has patient had a PCN reaction that required hospitalization: no Has patient had a PCN reaction occurring within the last 10 years: yes If all of the above answers are "NO", then may proceed with Cephalosporin use.   [name] Fluconazole Rash    REACTION: hives  . Difuleron [Ferrous Fumarate-Dss]   . 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 albuterol (VENTOLIN HFA) 108 (90 Base) MCG/ACT inhaler Inhale 2 puffs into the lungs every 6 (six) hours as needed for wheezing or shortness of breath (tight chest). 18 g 3  . ALPRAZolam (XANAX) 0.5 MG tablet Take 1 tablet (0.5 mg total) by mouth 2 (two) times daily as needed for anxiety or sleep. 60 tablet 5  . amitriptyline (ELAVIL) 50 MG tablet Take 1 tablet (50 mg total) by mouth at bedtime. 90 tablet 3  . atorvastatin (LIPITOR) 10 MG tablet TAKE 1 TABLET(10 MG) BY MOUTH DAILY 90 tablet 3  . b complex vitamins tablet Take 1 tablet by mouth daily.      . Cholecalciferol (VITAMIN D-1000 MAX ST) 25 MCG (1000 UT) tablet Take 2,000 Units by mouth daily.     [name] glipiZIDE (GLUCOTROL) 10 MG tablet Take 10 mg by mouth 2  (two) times daily before a meal.     . JANUMET XR 50-1000  MG TB24 Take 1 tablet by mouth 2 (two) times daily.   0  . lidocaine-prilocaine (EMLA) cream Apply 1 application topically as needed. Apply to port when going through Chemo    . MELOXICAM PO Take by mouth as needed.    . Multiple Vitamin (MULTIVITAMIN) tablet Take 1 tablet by mouth daily.    . Multiple Vitamins-Minerals (WOMENS MULTIVITAMIN PO) Take by mouth.    [name] omeprazole (PRILOSEC) 20 MG capsule TAKE 1 CAPSULE(20 MG) BY MOUTH AT BEDTIME 90 capsule 3  . sertraline (ZOLOFT) 100 MG tablet TAKE 2 TABLETS BY MOUTH EVERY DAY 180 tablet 1  . traZODone (DESYREL) 100 MG tablet TAKE 1/2 TO 1 (ONE-HALF TO ONE) TABLET BY MOUTH EVERY DAY AT BEDTIME AS NEEDED FOR INSOMNIA 90 tablet 1  . pregabalin (LYRICA) 150 MG capsule Take 1 capsule by mouth 2 (two) times a day.     Current Facility-Administered Medications on File Prior to Visit  Medication Dose Route Frequency Provider Last Rate Last Admin  . sodium chloride flush (NS) 0.9 % injection 10 mL  10 mL Intravenous PRN Cammie Sickle, MD   10 mL at 09/11/15 0845    Review of Systems  Constitutional: Negative for activity change, appetite change, fatigue, fever and unexpected weight change.  HENT: Positive for trouble swallowing. Negative for congestion, ear pain, rhinorrhea, sinus pressure and sore throat.        Dry mouth and throat  Eyes: Negative for pain, redness and visual disturbance.  Respiratory: Negative for cough, shortness of breath and wheezing.   Cardiovascular: Negative for chest pain and palpitations.  Gastrointestinal: Negative for abdominal pain, blood in stool, constipation and diarrhea.  Endocrine: Negative for polydipsia and polyuria.  Genitourinary: Negative for dysuria, frequency and urgency.  Musculoskeletal: Negative for arthralgias, back pain and myalgias.  Skin: Negative for pallor and rash.  Allergic/Immunologic: Negative for environmental allergies.   Neurological: Negative for dizziness, syncope and headaches.       Restless legs   Tingling/neuropathy   Hematological: Negative for adenopathy. Does not bruise/bleed easily.  Psychiatric/Behavioral: Positive for dysphoric mood. Negative for decreased concentration. The patient is nervous/anxious.        Grief-lost husband unexpectedly       Objective:   Physical Exam Constitutional:      General: She is not in acute distress.    Appearance: Normal appearance. She is well-developed. She is obese. She is not ill-appearing.  HENT:     Head: Normocephalic and atraumatic.     Mouth/Throat:     Mouth: Mucous membranes are dry.  Eyes:     Conjunctiva/sclera: Conjunctivae normal.     Pupils: Pupils are equal, round, and reactive to light.  Neck:     Thyroid: No thyromegaly.     Vascular: No carotid bruit or JVD.  Cardiovascular:     Rate and Rhythm: Normal rate and regular rhythm.     Pulses: Normal pulses.     Heart sounds: Normal heart sounds. No gallop.   Pulmonary:     Effort: Pulmonary effort is normal. No respiratory distress.     Breath sounds: Normal breath sounds. No wheezing or rales.  Abdominal:     General: Bowel sounds are normal. There is no distension or abdominal bruit.     Palpations: Abdomen is soft. There is no mass.     Tenderness: There is no abdominal tenderness.  Musculoskeletal:        General: No tenderness.  Cervical back: Normal range of motion and neck supple.     Right lower leg: No edema.     Left lower leg: No edema.  Lymphadenopathy:     Cervical: No cervical adenopathy.  Skin:    General: Skin is warm and dry.     Findings: No rash.  Neurological:     Mental Status: She is alert.     Coordination: Coordination normal.     Deep Tendon Reflexes: Reflexes are normal and symmetric. Reflexes normal.  Psychiatric:        Mood and Affect: Mood is depressed.        Cognition and Memory: Cognition and memory normal.     Comments: Disc loss  of husband recently           Assessment & Plan:   Problem List Items Addressed This Visit      Cardiovascular and Mediastinum   Pre-syncope    Pt still c/o occasional weak spells without explanation  Is careful  [employer] duty excuse written        Endocrine   Controlled type 2 diabetes mellitus without complication ([place])    Continues under care of endocrinology        Nervous and Auditory   Neuropathy associated with cancer ([place])    Continues lyrica         Other   B12 deficiency    Check with labs today  More severe RLS lately      Relevant Orders   Vitamin B12 (Completed)   Anemia, iron deficiency    Oncology aware Has been stable in the past and takes oral iron  Re check cbc today in light of worse RLS lately      Relevant Orders   CBC with Differential/Platelet (Completed)   RLS (restless legs syndrome) - Primary    Worse lately-starting earlier in the day  Asks to add a day dose to see if helpful  Will try 1/2 mg at lunchtime- watching for side effects  Also check cbc to make sure anemia is not worse       Swallowing dysfunction    Pt had abn swallowing study then reassuring scope at ENT Dry mouth sited as cause (past XT from tonsil cancer, also on some anticholinergic medications) Learned some changes to make from speech tx-doing better      Grief reaction    Lost husband to [facility] after cardiac arrest Unexpected  Will start counseling soon

## 2019-09-18 NOTE — Assessment & Plan Note (Signed)
Continues under care of endocrinology

## 2019-09-18 NOTE — Assessment & Plan Note (Signed)
Continues lyrica

## 2019-09-18 NOTE — Assessment & Plan Note (Signed)
Lost husband to [facility] after cardiac arrest Unexpected  Will start counseling soon

## 2019-09-18 NOTE — Assessment & Plan Note (Signed)
Pt still c/o occasional weak spells without explanation  Is careful  Suzanne Strong duty excuse written

## 2019-09-18 NOTE — Assessment & Plan Note (Signed)
Pt had abn swallowing study then reassuring scope at ENT Dry mouth sited as cause (past XT from tonsil cancer, also on some anticholinergic medications) Learned some changes to make from speech tx-doing better

## 2019-09-18 NOTE — Assessment & Plan Note (Signed)
Check with labs today  More severe RLS lately

## 2019-09-18 NOTE — Assessment & Plan Note (Signed)
Oncology aware Has been stable in the past and takes oral iron  Re check cbc today in light of worse RLS lately

## 2019-09-18 NOTE — Assessment & Plan Note (Signed)
Worse lately-starting earlier in the day  Asks to add a day dose to see if helpful  Will try 1/2 mg at lunchtime- watching for side effects  Also check cbc to make sure anemia is not worse

## 2019-09-20 ENCOUNTER — Encounter: Payer: Self-pay | Admitting: Psychiatry

## 2019-09-20 ENCOUNTER — Ambulatory Visit (INDEPENDENT_AMBULATORY_CARE_PROVIDER_SITE_OTHER): Payer: PPO | Admitting: Psychiatry

## 2019-09-20 ENCOUNTER — Telehealth: Payer: Self-pay

## 2019-09-20 ENCOUNTER — Other Ambulatory Visit: Payer: Self-pay

## 2019-09-20 DIAGNOSIS — F411 Generalized anxiety disorder: Secondary | ICD-10-CM

## 2019-09-20 DIAGNOSIS — G4700 Insomnia, unspecified: Secondary | ICD-10-CM

## 2019-09-20 MED ORDER — SERTRALINE HCL 100 MG PO TABS: ORAL_TABLET | ORAL | 1 refills | Status: DC

## 2019-09-20 MED ORDER — TRAZODONE HCL 100 MG PO TABS: ORAL_TABLET | ORAL | 1 refills | Status: DC

## 2019-09-20 MED ORDER — BUSPIRONE HCL 15 MG PO TABS: ORAL_TABLET | ORAL | 1 refills | Status: DC

## 2019-09-20 MED ORDER — ALPRAZOLAM 0.5 MG PO TABS: 0.5000 mg | ORAL_TABLET | Freq: Two times a day (BID) | ORAL | 5 refills | Status: DC | PRN

## 2019-09-20 NOTE — Progress Notes (Signed)
Suzanne Strong [id number] 1945-11-08 74 y.o.  Subjective:   Patient ID:  Suzanne Strong is a 74 y.o. (DOB [birth year]) female.  Chief Complaint:  Chief Complaint  Patient presents with  . Anxiety  . Insomnia  . Other    Grief, RLS    HPI Suzanne Strong presents to the office today for follow-up of anxiety, insomnia, grief, and RLS.  Patient reports that her husband died suddenly approximately 6 months ago.  She reports that she talked to her husband at 5:15 pm and at 5:30 pm she had a feeling that something was wrong, and 10 minutes later the police arrived at her door. She reports that her husband had a cardiac event while driving. She reports that this loss has been difficult for her. Brother and friend have been calling her daily. She has been working daily and this has been helpful for her. She reports that she has had some sadness related to grief. Denies depressed mood. She denies anxiety or nervousness other that when she feels restless. She reports that she feels tired and lethargic when she gets home. She reports in the past she could work from 5 pm-8 pm and now she is unable to work in the evenings due to fatigue and restlessness. She reports that she now has anxiety and worry about her restlessness and sleep.   She reports that she has not been sleeping well since the death of her husband. She reports that around 2-3 pm she becomes restless. She reports that her legs and eyes become restless and she feels that she needs to walk around. PCP increased Ropinirole and that this has been helpful at times and other times is not noticing an improvement. She reports, "by the time I get ready to go to bed I am wired" and has difficulty falling asleep due to restlessness in her feet and legs. She reports awakening 1-3 times a night. She reports that she may go to bed at 11 pm and may awaken and feel rested at 2 am and then will try to return to sleep and then has light, restless sleep  for the remainder of the night. Estimates sleeping 4-5 hours a night at most.   Appetite has been "ok, not great." Energy has been lower. She reports that her motivation has been ok and has been keeping her commitments. Continues to be involved in her church. Concentration has been adequate. Denies SI.    She reports using Xanax prn about 2-3 times a week.  Past Psychiatric Medication Trials: Remeron Xanax Gabapentin- Unsure if this is helpful for RLS Lyrica Ropinorole- Helpful for RLS Zoloft Lyrica Trazodone   Mini-Mental     Clinical Support from 07/15/2018 in [place] at [place] from 06/29/2017 in [place] at [place] from 06/25/2016 in [place] at [facility]  Total Score (max 30 points ) 17 20 20     PHQ2-9     Clinical Support from 07/15/2018 in [place] at [place] from 06/29/2017 in [place] at [place] from 06/25/2016 in [place] at [place] Follow Up  from 02/24/2016 in [place] from 10/25/2015 in [facility] [facility]  PHQ-2 Total Score 0 0 0 0 0  PHQ-9 Total Score 0 0 -- -- --       Review of Systems:  Review of Systems  Musculoskeletal: Negative for gait problem.  Neurological: Negative for tremors.       RLS. Reports occasional jerking in upper extremity  Psychiatric/Behavioral:       Please refer to HPI    Medications: I have reviewed the patient's current medications.  Current Outpatient Medications  Medication Sig Dispense Refill  . albuterol (VENTOLIN HFA) 108 (90 Base) MCG/ACT inhaler Inhale 2 puffs into the lungs every 6 (six) hours as needed for wheezing or shortness of breath (tight chest). 18 g 3  . ALPRAZolam (XANAX) 0.5 MG tablet Take 1 tablet (0.5 mg total) by mouth 2 (two) times daily as needed for anxiety or sleep. 60 tablet 5  .  amitriptyline (ELAVIL) 50 MG tablet Take 1 tablet (50 mg total) by mouth at bedtime. 90 tablet 3  . atorvastatin (LIPITOR) 10 MG tablet TAKE 1 TABLET(10 MG) BY MOUTH DAILY 90 tablet 3  . b complex vitamins tablet Take 1 tablet by mouth daily.      . Cholecalciferol (VITAMIN D-1000 MAX ST) 25 MCG (1000 UT) tablet Take 2,000 Units by mouth daily.     [name] glipiZIDE (GLUCOTROL) 10 MG tablet Take 10 mg by mouth 2 (two) times daily before a meal.     . JANUMET XR 50-1000 MG TB24 Take 1 tablet by mouth 2 (two) times daily.   0  . MELOXICAM PO Take by mouth as needed.    . Multiple Vitamin (MULTIVITAMIN) tablet Take 1 tablet by mouth daily.    . Multiple Vitamins-Minerals (WOMENS MULTIVITAMIN PO) Take by mouth.    [name] omeprazole (PRILOSEC) 20 MG capsule TAKE 1 CAPSULE(20 MG) BY MOUTH AT BEDTIME 90 capsule 3  . rOPINIRole (REQUIP) 1 MG tablet Take 3 pills by mouth daily at bedtime and 1/2 pill at lunch 315 tablet 0  . sertraline (ZOLOFT) 100 MG tablet TAKE 2 TABLETS BY MOUTH EVERY DAY 180 tablet 1  . traZODone (DESYREL) 100 MG tablet TAKE 1/2 TO 1 (ONE-HALF TO ONE) TABLET BY MOUTH EVERY DAY AT BEDTIME AS NEEDED FOR INSOMNIA 90 tablet 1  . busPIRone (BUSPAR) 15 MG tablet Take 1/3 tablet p.o. twice daily for 1 week, then take 2/3 tablet p.o. twice daily for 1 week, then take 1 tablet p.o. twice daily 60 tablet 1  . lidocaine-prilocaine (EMLA) cream Apply 1 application topically as needed. Apply to port when going through Chemo    . pregabalin (LYRICA) 150 MG capsule Take 1 capsule by mouth 2 (two) times a day.     No current facility-administered medications for this visit.   Facility-Administered Medications Ordered in Other Visits  Medication Dose Route Frequency Provider Last Rate Last Admin  . sodium chloride flush (NS) 0.9 % injection 10 mL  10 mL Intravenous PRN Cammie Sickle, MD   10 mL at 09/11/15 0845    Medication Side Effects: Other: dry mouth  Allergies:  Allergies  Allergen  Reactions  . Amoxicillin Swelling    REACTION: rash, swelling Has patient had a PCN reaction causing immediate rash, facial/tongue/throat swelling, SOB or lightheadedness with hypotension: yes Has patient had a PCN reaction causing severe rash involving mucus membranes or skin necrosis: no Has patient had a PCN reaction that required hospitalization: no Has patient had a PCN reaction occurring within the last 10 years: yes If all of the above answers are "NO", then may proceed with Cephalosporin use.   [name] Fluconazole Rash    REACTION: hives  . Difuleron [Ferrous Fumarate-Dss]   . 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 Anemia    iron deficiency and B12 deficiency  . Anxiety   . Breast cancer ([place]) 02/25/2015   upper-outer quadrant of left female breast, Triple Negative. Neo-adjuvant chemo with complete pathologic response.   . Cervical dysplasia    conization  . Diabetes mellitus without complication ([place])   . Diverticulosis   . Fever 04/11/2015  . Gastroparesis    related to previous radiation tx  . GERD (gastroesophageal reflux disease)   . Hyperlipidemia   . Hypertension   . Neuropathy    legs/feet, S/P chemo meds  . Personal history of chemotherapy   . Personal history of radiation therapy 2017   LEFT lumpectomy w/ radiation  . RLS (restless legs syndrome)   . Shingles    Hx of  . 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 Wears dentures    partial bottom    Family History  Problem Relation Age of Onset  . Osteoporosis Mother   . Hyperlipidemia Mother   . Depression Mother   . Cancer Mother        skin cancer ? basal cell and lung ca heavy smoker  . Alcohol abuse Father   . Cancer Father        skin CA ? basal cell  . Heart disease Father        CHF  . Hyperlipidemia Brother   . Hypertension Brother   . Breast cancer Neg Hx     Social History    Socioeconomic History  . Marital status: Married    Spouse name: Not on file  . Number of children: 0  . Years of education: 51  . Highest education level: Not on file  Occupational History  . Occupation: Clinical cytogeneticist  Tobacco Use  . Smoking status: Former Smoker    Packs/day: 0.50    Years: 6.00    Pack years: 3.00    Types: Cigarettes    Quit date: 03/10/1984    Years since quitting: 35.5  . Smokeless tobacco: Never Used  Vaping Use  . Vaping Use: Never used  Substance and Sexual Activity  . Alcohol use: No    Alcohol/week: 0.0 standard drinks  . Drug use: No  . Sexual activity: Not Currently  Other Topics Concern  . Not on file  Social History Narrative   Lives with husband in a one story home.  No children.        Works as Radiation protection practitioner for [employer].        Education: college.    Social Determinants of Health   Financial Resource Strain: Low Risk   . Difficulty of Paying Living Expenses: Not very hard  Food Insecurity:   . Worried About Charity fundraiser in the Last Year:   . Arboriculturist in the Last Year:   Transportation Needs:   . Film/video editor (Medical):   [name] Lack of Transportation (Non-Medical):   Physical Activity:   . Days of Exercise per Week:   . Minutes of Exercise per Session:   Stress:   . Feeling of Stress :   Social Connections:   . Frequency of Communication with Friends and Family:   . Frequency of Social Gatherings with Friends and Family:   . Attends Religious Services:   . Active Member of Clubs or Organizations:   .  Attends Archivist Meetings:   [name] Marital Status:   Intimate Partner Violence:   . Fear of Current or Ex-Partner:   . Emotionally Abused:   [name] Physically Abused:   . Sexually Abused:     Past Medical History, Surgical history, Social history, and Family history were reviewed and updated as appropriate.   Please see review of systems for further details on the patient's review from today.    Objective:   Physical Exam:  There were no vitals taken for this visit.  Physical Exam Constitutional:      General: She is not in acute distress. Musculoskeletal:        General: No deformity.  Neurological:     Mental Status: She is alert and oriented to person, place, and time.     Coordination: Coordination normal.  Psychiatric:        Attention and Perception: Attention and perception normal. She does not perceive auditory or visual hallucinations.        Mood and Affect: Mood is anxious. Mood is not depressed. Affect is not labile, blunt, angry or inappropriate.        Speech: Speech normal.        Behavior: Behavior normal.        Thought Content: Thought content normal. Thought content is not paranoid or delusional. Thought content does not include homicidal or suicidal ideation. Thought content does not include homicidal or suicidal plan.        Cognition and Memory: Cognition and memory normal.        Judgment: Judgment normal.     Comments: Insight intact     Lab Review:     Component Value Date/Time   NA 138 04/20/2019 1120   NA 142 03/01/2014 0846   K 4.1 04/20/2019 1120   K 4.3 03/01/2014 0846   CL 102 04/20/2019 1120   CL 102 03/01/2014 0846   CO2 26 04/20/2019 1120   CO2 28 03/01/2014 0846   GLUCOSE 110 (H) 04/20/2019 1120   GLUCOSE 219 (H) 03/01/2014 0846   BUN 15 04/20/2019 1120   BUN 12 03/01/2014 0846   CREATININE 0.84 04/20/2019 1120   CREATININE 0.95 03/01/2014 0846   CALCIUM 8.8 (L) 04/20/2019 1120   CALCIUM 8.6 03/01/2014 0846   PROT 7.0 04/20/2019 1120   PROT 7.5 03/01/2014 0846   ALBUMIN 3.6 04/20/2019 1120   ALBUMIN 3.4 03/01/2014 0846   AST 28 04/20/2019 1120   AST 20 03/01/2014 0846   ALT 22 04/20/2019 1120   ALT 28 03/01/2014 0846   ALKPHOS 88 04/20/2019 1120   ALKPHOS 122 (H) 03/01/2014 0846   BILITOT 0.3 04/20/2019 1120   BILITOT 0.2 03/01/2014 0846   GFRNONAA >60 04/20/2019 1120   GFRNONAA >60 03/01/2014 0846   GFRNONAA  >60 08/24/2013 1015   GFRAA >60 04/20/2019 1120   GFRAA >60 03/01/2014 0846   GFRAA >60 08/24/2013 1015       Component Value Date/Time   WBC 5.6 09/18/2019 0941   RBC 3.91 09/18/2019 0941   HGB 10.9 (L) 09/18/2019 0941   HGB 11.5 (L) 03/01/2014 0846   HCT 34.1 (L) 09/18/2019 0941   HCT 36.4 03/01/2014 0846   PLT 206.0 09/18/2019 0941   PLT 254 03/01/2014 0846   MCV 87.2 09/18/2019 0941   MCV 81 03/01/2014 0846   MCH 28.1 04/20/2019 1120   MCHC 32.1 09/18/2019 0941   RDW 16.3 (H) 09/18/2019 0941   RDW 15.6 (H) 03/01/2014 9326  LYMPHSABS 0.9 09/18/2019 0941   LYMPHSABS 1.0 03/01/2014 0846   MONOABS 0.4 09/18/2019 0941   MONOABS 0.3 03/01/2014 0846   EOSABS 0.2 09/18/2019 0941   EOSABS 0.1 03/01/2014 0846   BASOSABS 0.0 09/18/2019 0941   BASOSABS 0.0 03/01/2014 0846    No results found for: POCLITH, LITHIUM   No results found for: PHENYTOIN, PHENOBARB, VALPROATE, CBMZ   .res Assessment: Plan:   Patient seen for 30 minutes and time spent counseling patient regarding anxiety and recent sleep disturbance.  Also discussed that RLS seems to be contributing to sleep disturbance.  Discussed that worsening of RLS seems to have started when gabapentin was changed to Lyrica since gabapentin was not helpful for her neuropathy.  Discussed that gabapentin and Lyrica can be used off label for treatment of RLS. Discussed taking Lyrica about 1-3 hours before bedtime instead of immediately before bedtime to allow it to be effective for RLS prior to going to sleep.  Also encouraged patient to continue with plan recommended by PCP to dose of ropinirole midday to improve RLS. Discussed that patient is currently on max dose of sertraline and therefore discussed starting BuSpar for augmentation of anxiety.  Discussed potential benefits, risks, and side effects of BuSpar.  Patient agrees to trial of BuSpar. Start BuSpar 15 mg 1/3 tablet twice daily for 1 week, then increase to 2/3 tablet twice daily  for 1 week, then increase to 1 tablet twice daily for anxiety. Continue sertraline 200 mg daily for anxiety. Continue trazodone 100 mg 1/2-1 tab at bedtime as needed for insomnia. Patient to follow-up in 6 weeks or sooner if clinically indicated. Discussed option to see therapist to process the loss of her husband and for grief support.  Patient declines and reports that she feels that she is managing grief at this time. Patient advised to contact office with any questions, adverse effects, or acute worsening in signs and symptoms. [place] was seen today for anxiety, insomnia and other.  Diagnoses and all orders for this visit:  Anxiety state -     ALPRAZolam (XANAX) 0.5 MG tablet; Take 1 tablet (0.5 mg total) by mouth 2 (two) times daily as needed for anxiety or sleep. -     sertraline (ZOLOFT) 100 MG tablet; TAKE 2 TABLETS BY MOUTH EVERY DAY  Insomnia, unspecified type -     traZODone (DESYREL) 100 MG tablet; TAKE 1/2 TO 1 (ONE-HALF TO ONE) TABLET BY MOUTH EVERY DAY AT BEDTIME AS NEEDED FOR INSOMNIA  Other orders -     busPIRone (BUSPAR) 15 MG tablet; Take 1/3 tablet p.o. twice daily for 1 week, then take 2/3 tablet p.o. twice daily for 1 week, then take 1 tablet p.o. twice daily     Please see After Visit Summary for patient specific instructions.  Future Appointments  Date Time Provider [place]  10/19/2019 11:00 AM [facility]  10/20/2019 10:00 AM [facility] None  10/20/2019 10:30 AM Cammie Sickle, MD [facility] None  11/01/2019  9:30 AM Thayer Headings, PMHNP [facility]-CP None  01/02/2020  8:30 AM [facility] PHARMACIST [facility]  05/23/2020 11:00 AM [place], Eulas Post, MD [facility] None    No orders of the defined types were placed in this encounter.   -------------------------------

## 2019-09-21 ENCOUNTER — Encounter: Payer: Self-pay | Admitting: *Deleted

## 2019-09-21 NOTE — Chronic Care Management (AMB) (Signed)
CCM coordination call completed 09/20/19. See note from 09/14/19.

## 2019-09-21 NOTE — Progress Notes (Signed)
I reviewed health advisor's note, was available for consultation, and agree with documentation and plan. Doyt Castellana MD  

## 2019-09-21 NOTE — Chronic Care Management (AMB) (Signed)
Reviewed chart for medication changes ahead of medication coordination call.   No medications change since last CCM visit   08/01/19: Endocrinology - stable, continue current medications    Per endocrinology, A1c 08/01/19 6.9% Patient obtains medications through Adherence Packaging  30 Days  Patient will begin first adherence packaging 08/25/19  This delivery to include: Packs, 30 DS  Omeprazole 20 mg - 1 bedtime   Amitriptyline 50 mg - 1 bedtime   Ropinirole 1 mg - 3 bedtime   Pregabalin 150 mg - 1 breakfast, 1 bedtime  Centrum Silver Multivitamin - 1 breakfast  Vitamin B-Complex - 1 breakfast  Alpha lipoic acid 200 mg - 1 bedtime  Vitamin D3 50 mcg - 1 breakfast, 1 bedtime Vials, 30 DS  Trazodone and Xanax delivery scheduled for 08/23/19  Patient declined (meds) last month due to PRN use/additional supply on hand:  Albuterol (has adequate supply, uses about 2-3x/week)  Janumet XR 50/1000 mg - 1 breakfast, 1 evening meal (As of 7/6 has about 45 DS left)   Atorvastatin 10 mg - 1 breakfast (As of 7/6 has about 30 days left)  Sertraline 100 mg - 2 breakfast (As of 7/6 has about 30 days left)  Glipizide 10 mg - 1 breakfast, 1 evening meal (As of 7/6 has 90 DS left)   Patient will be due for next adherence delivery on: 09/22/19  No refills needed prior to delivery  Confirmed delivery date of 08/25/19 for adherence packaging, advised patient that pharmacy will contact them the morning of delivery.  [name], PharmD Clinical Pharmacist [facility] at [facility] [phone]

## 2019-10-10 ENCOUNTER — Telehealth: Payer: Self-pay

## 2019-10-10 NOTE — Progress Notes (Unsigned)
Chronic Care Management Pharmacy Assistant   Name: Suzanne Strong  MRN: [id number] DOB: 10-May-1945  Reason for Encounter: Medication Review  Patient Questions:  1.  Have you seen any other providers since your last visit? No  2.  Any changes in your medicines or health? No     PCP : Tower, [name], MD  Allergies:   Allergies  Allergen Reactions  . Amoxicillin Swelling    REACTION: rash, swelling Has patient had a PCN reaction causing immediate rash, facial/tongue/throat swelling, SOB or lightheadedness with hypotension: yes Has patient had a PCN reaction causing severe rash involving mucus membranes or skin necrosis: no Has patient had a PCN reaction that required hospitalization: no Has patient had a PCN reaction occurring within the last 10 years: yes If all of the above answers are "NO", then may proceed with Cephalosporin use.   [name] Fluconazole Rash    REACTION: hives  . Difuleron [Ferrous Fumarate-Dss]   . 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 albuterol (VENTOLIN HFA) 108 (90 Base) MCG/ACT inhaler Inhale 2 puffs into the lungs every 6 (six) hours as needed for wheezing or shortness of breath (tight chest).   . ALPRAZolam (XANAX) 0.5 MG tablet Take 1 tablet (0.5 mg total) by mouth 2 (two) times daily as needed for anxiety or sleep.   [name] amitriptyline (ELAVIL) 50 MG tablet Take 1 tablet (50 mg total) by mouth at bedtime.   [name] atorvastatin (LIPITOR) 10 MG tablet TAKE 1 TABLET(10 MG) BY MOUTH DAILY   . b complex vitamins tablet Take 1 tablet by mouth daily.     . busPIRone (BUSPAR) 15 MG tablet Take 1/3 tablet p.o. twice daily for 1 week, then take 2/3 tablet p.o. twice daily for 1 week, then take 1 tablet p.o. twice daily   . Cholecalciferol (VITAMIN D-1000 MAX ST) 25 MCG (1000 UT) tablet Take 2,000 Units by mouth  daily.    [name] glipiZIDE (GLUCOTROL) 10 MG tablet Take 10 mg by mouth 2 (two) times daily before a meal.    . JANUMET XR 50-1000 MG TB24 Take 1 tablet by mouth 2 (two) times daily.    [name] lidocaine-prilocaine (EMLA) cream Apply 1 application topically as needed. Apply to port when going through Chemo   . MELOXICAM PO Take by mouth as needed.   . Multiple Vitamin (MULTIVITAMIN) tablet Take 1 tablet by mouth daily.   . Multiple Vitamins-Minerals (WOMENS MULTIVITAMIN PO) Take by mouth.   [name] omeprazole (PRILOSEC) 20 MG capsule TAKE 1 CAPSULE(20 MG) BY MOUTH AT BEDTIME   . pregabalin (LYRICA) 150 MG capsule Take 1 capsule by mouth 2 (two) times a day. 04/20/2019: Currently taking 1 in the am and 1 at night  . rOPINIRole (REQUIP) 1 MG tablet Take 3 pills by mouth daily at bedtime and 1/2 pill at lunch   . sertraline (ZOLOFT) 100 MG tablet TAKE 2 TABLETS BY MOUTH EVERY DAY   . traZODone (DESYREL) 100 MG tablet TAKE 1/2 TO 1 (ONE-HALF TO ONE) TABLET BY MOUTH EVERY DAY AT BEDTIME AS NEEDED FOR INSOMNIA    Facility-Administered Encounter Medications as of 10/10/2019  Medication  . sodium chloride flush (NS) 0.9 % injection 10 mL    Current Diagnosis: Patient Active Problem List   Diagnosis Date Noted  . Grief reaction  09/18/2019  . Swallowing dysfunction 07/18/2019  . Cough 02/21/2019  . Pre-syncope 02/07/2018  . Orthostatic hypotension 01/26/2018  . Episodic weakness 01/26/2018  . Medicare annual wellness visit, subsequent 06/29/2017  . 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 Preoperative examination 04/27/2017  . Carpal tunnel syndrome of right wrist 11/13/2016  . 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 Obstructive sleep apnea 01/01/2016  . Carcinoma of upper-outer quadrant of left breast in female, estrogen receptor negative ([place]) 11/21/2015  . Hypomagnesemia 08/28/2015  . Initial Medicare annual wellness visit 10/16/2014  . Estrogen deficiency 10/16/2014  . Colon cancer  screening 10/16/2014  . Controlled type 2 diabetes mellitus without complication ([place]) 81/85/6314  . RLS (restless legs syndrome) 02/21/2014  . Chronic neck pain 07/25/2010  . Depression 07/07/2010  . Routine general medical examination at a health care facility 06/26/2010  . B12 deficiency 09/04/2009  . Anemia, iron deficiency 08/29/2009  . Anxiety state 08/26/2009  . GASTROPARESIS 08/26/2009  . Vitamin D deficiency 07/19/2008  . Disorder of bone and cartilage 07/19/2007  . Hyperlipidemia 07/18/2007  . EDEMA 07/18/2007  . Skin lesion, superficial 06/02/2007    Goals Addressed   None     Follow-Up:  Coordination of Enhanced Pharmacy Services  Reviewed chart for medication changes ahead of medication coordination call.  No OVs, Consults, or hospital visits since last care coordination call/Pharmacist visit.  No medication changes indicated.  BP Readings from Last 3 Encounters:  09/18/19 (!) 142/84  05/18/19 (!) 139/58  04/20/19 128/76    Lab Results  Component Value Date   HGBA1C 8.0 (H) 06/29/2017     Patient obtains medications through Adherence Packaging  30 Days   Last adherence delivery included:  . Omeprazole 20 mg - 1 bedtime  . Amitriptyline 50 mg - 1 bedtime  . Ropinirole 1 mg -  tablet breakfast, 3 bedtime . Pregabalin 150 mg - 1 breakfast, 1 bedtime . Vitamin B-Complex - 1 breakfast . Alpha lipoic acid 200 mg - 1 bedtime  . Janumet XR 50/1000 mg - 1 breakfast, 1 evening meal . Sertraline 100 mg - 2 breakfast . Trazodone 100 mg - 1 bedtime           Vials: . Xanax 0.5 mg - 30 DS . Albuterol - 30 DS . Buspirone (new med would like vial this month to try it out) - 30 DS  Patient declined the following last month: [name] Atorvastatin 10 mg - 1 breakfast (As of 8/4 has about 65 days left, confirms she is still taking every day, has not filled elsewhere) . Glipizide 10 mg - 1 breakfast, 1 evening meal (As of 8/4 has 60 DS left, confirmed she is still taking  every day, has not filled elsewhere) . Centrum Silver Multivitamin - 1 breakfast (pt has an energy metabolism and bone support vitamin she is taking now instead, will let us know when she would like the energy supplement added to packs) . Vitamin D3 50 mcg - 1 breakfast, 1 bedtime (pt has some of this at home and wanted to finish out, then add back to packs)   Patient is due for next adherence delivery on: 10/21/2019. Called patient and reviewed medications and coordinated delivery.  This delivery to include: . Omeprazole 20 mg - 1 bedtime  . Amitriptyline 50 mg - 1 bedtime  . Ropinirole 1 mg -  tablet breakfast, 3 bedtime . Pregabalin 150 mg - 1 breakfast, 1 bedtime . Vitamin B-Complex -  1 breakfast . Alpha lipoic acid 200 mg - 1 bedtime  . Janumet XR 50/1000 mg - 1 breakfast, 1 evening meal . Sertraline 100 mg - 2 breakfast . Trazodone 100 mg - 1 bedtime           Vials: . Xanax 0.5 mg - 30 DS . Albuterol - 30 DS . Buspirone (new med would like vial this month to try it out) - 30 DS Patient declined the following medications: . Atorvastatin 10 mg - 1 breakfast (As of 8/4 has about 45 days left, confirms she is still taking every day, has not filled elsewhere) . Glipizide 10 mg - 1 breakfast, 1 evening meal (As of 8/4 has 40 DS left, confirmed she is still taking every day, has not filled elsewhere) . Centrum Silver Multivitamin - 1 breakfast (pt has an energy metabolism and bone support vitamin she is taking now instead, will let us know when she would like the energy supplement added to packs) . Vitamin D3 50 mcg - 1 breakfast, 1 bedtime (pt has some of this at home and wanted to finish out, then add back to packs) Patient needs refills for ***.  Confirmed delivery date of 10/21/2019, advised patient that pharmacy will contact them the morning of delivery.

## 2019-10-17 ENCOUNTER — Telehealth: Payer: Self-pay

## 2019-10-17 NOTE — Progress Notes (Addendum)
[id number]   Chronic Care Management Pharmacy Assistant   Name: Suzanne Strong  MRN: [id number] DOB: 06-05-1945  Reason for Encounter: Medication Review  .  PCP : [name], MD  Allergies:   Allergies  Allergen Reactions  . Amoxicillin Swelling    REACTION: rash, swelling Has patient had a PCN reaction causing immediate rash, facial/tongue/throat swelling, SOB or lightheadedness with hypotension: yes Has patient had a PCN reaction causing severe rash involving mucus membranes or skin necrosis: no Has patient had a PCN reaction that required hospitalization: no Has patient had a PCN reaction occurring within the last 10 years: yes If all of the above answers are "NO", then may proceed with Cephalosporin use.   [name] Fluconazole Rash    REACTION: hives  . Difuleron [Ferrous Fumarate-Dss]   . 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 albuterol (VENTOLIN HFA) 108 (90 Base) MCG/ACT inhaler Inhale 2 puffs into the lungs every 6 (six) hours as needed for wheezing or shortness of breath (tight chest).   . ALPRAZolam (XANAX) 0.5 MG tablet Take 1 tablet (0.5 mg total) by mouth 2 (two) times daily as needed for anxiety or sleep.   [name] amitriptyline (ELAVIL) 50 MG tablet Take 1 tablet (50 mg total) by mouth at bedtime.   [name] atorvastatin (LIPITOR) 10 MG tablet TAKE 1 TABLET(10 MG) BY MOUTH DAILY   . b complex vitamins tablet Take 1 tablet by mouth daily.     . busPIRone (BUSPAR) 15 MG tablet Take 1/3 tablet p.o. twice daily for 1 week, then take 2/3 tablet p.o. twice daily for 1 week, then take 1 tablet p.o. twice daily   . Cholecalciferol (VITAMIN D-1000 MAX ST) 25 MCG (1000 UT) tablet Take 2,000 Units by mouth daily.    [name] glipiZIDE (GLUCOTROL) 10 MG tablet Take 10 mg by mouth 2 (two) times daily before a meal.    . JANUMET XR 50-1000  MG TB24 Take 1 tablet by mouth 2 (two) times daily.    [name] lidocaine-prilocaine (EMLA) cream Apply 1 application topically as needed. Apply to port when going through Chemo   . MELOXICAM PO Take by mouth as needed.   . Multiple Vitamin (MULTIVITAMIN) tablet Take 1 tablet by mouth daily.   . Multiple Vitamins-Minerals (WOMENS MULTIVITAMIN PO) Take by mouth.   [name] omeprazole (PRILOSEC) 20 MG capsule TAKE 1 CAPSULE(20 MG) BY MOUTH AT BEDTIME   . pregabalin (LYRICA) 150 MG capsule Take 1 capsule by mouth 2 (two) times a day. 04/20/2019: Currently taking 1 in the am and 1 at night  . rOPINIRole (REQUIP) 1 MG tablet Take 3 pills by mouth daily at bedtime and 1/2 pill at lunch   . sertraline (ZOLOFT) 100 MG tablet TAKE 2 TABLETS BY MOUTH EVERY DAY   . traZODone (DESYREL) 100 MG tablet TAKE 1/2 TO 1 (ONE-HALF TO ONE) TABLET BY MOUTH EVERY DAY AT BEDTIME AS NEEDED FOR INSOMNIA    Facility-Administered Encounter Medications as of 10/17/2019  Medication  . sodium chloride flush (NS) 0.9 % injection 10 mL    Current Diagnosis: Patient Active Problem List   Diagnosis Date Noted  . Grief reaction 09/18/2019  . Swallowing dysfunction 07/18/2019  . Cough 02/21/2019  . Pre-syncope 02/07/2018  . Orthostatic hypotension 01/26/2018  . Episodic weakness 01/26/2018  . Medicare annual  wellness visit, subsequent 06/29/2017  . 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 Preoperative examination 04/27/2017  . Carpal tunnel syndrome of right wrist 11/13/2016  . 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 Obstructive sleep apnea 01/01/2016  . Carcinoma of upper-outer quadrant of left breast in female, estrogen receptor negative ([place]) 11/21/2015  . Hypomagnesemia 08/28/2015  . Initial Medicare annual wellness visit 10/16/2014  . Estrogen deficiency 10/16/2014  . Colon cancer screening 10/16/2014  . Controlled type 2 diabetes mellitus without complication ([place]) 44/31/5400  . RLS (restless legs  syndrome) 02/21/2014  . Chronic neck pain 07/25/2010  . Depression 07/07/2010  . Routine general medical examination at a health care facility 06/26/2010  . B12 deficiency 09/04/2009  . Anemia, iron deficiency 08/29/2009  . Anxiety state 08/26/2009  . GASTROPARESIS 08/26/2009  . Vitamin D deficiency 07/19/2008  . Disorder of bone and cartilage 07/19/2007  . Hyperlipidemia 07/18/2007  . EDEMA 07/18/2007  . Skin lesion, superficial 06/02/2007     Follow-Up:  Pharmacist Review   Reviewed chart for medication changes ahead of medication coordination call.  No OVs, Consults, or hospital visits since last care   No medication changes indicated   BP Readings from Last 3 Encounters:  09/18/19 (!) 142/84  05/18/19 (!) 139/58  04/20/19 128/76    Lab Results  Component Value Date   HGBA1C 8.0 (H) 06/29/2017     Patient obtains medications through Adherence Packaging  30 Days   Last adherence delivery included: (medication name and frequency)   Omeprazole 20 mg - 1 bedtime   Amitriptyline 50 mg - 1 bedtime   Ropinirole 1 mg -  tablet breakfast, 3 bedtime  Pregabalin 150 mg - 1 breakfast, 1 bedtime  Vitamin B-Complex - 1 breakfast  Alpha lipoic acid 200 mg - 1 bedtime   Janumet XR 50/1000 mg - 1 breakfast, 1 evening meal  Sertraline 100 mg - 2 breakfast  Trazodone 100 mg - 1 bedtime           Vials:  Xanax 0.5 mg - 30 DS  Albuterol - 30 DS Buspirone (new med would like vial this month to try it out) - 30 DS  Patient declined (meds) last month due to PRN use/additional supply on hand.  Atorvastatin 10 mg - 1 breakfast (As of 8/4 has about 45 days left, confirms she is still taking every day, has not filled elsewhere)  Glipizide 10 mg - 1 breakfast, 1 evening meal (As of 8/4 has 40 DS left, confirmed she is still taking every day, has not filled elsewhere)  Centrum Silver Multivitamin - 1 breakfast (pt has an energy metabolism and bone support vitamin she is  taking now instead, will let us know when she would like the energy supplement added to packs)  Vitamin D3 50 mcg - 1 breakfast, 1 bedtime (pt has some of this at home and wanted to finish out, then add back to packs)    Patient is due for next adherence delivery on: 9/03/021. Called patient and reviewed medications and coordinated delivery.  This delivery to include:                 Amitriptyline 50 mg - Daily bedtime  Atorvastatin 10 mg - Daily breakfast   Buspirone 15 mg -twice Daily Breakfast, bedtime  Glipizide 10 mg twice Daily -  breakfast,  evening meal   Janumet XR 50/1000 mg Twice Daily -  breakfast, evening meal  Lidocaine- Prilocaine EMLA cream-PRN  Omeprazole 20 mg Daily -  bedtime  Pregabalin 150 mg twice Daily -  breakfast,  Bedtime  Ropinirole 1 mg -  tablet breakfast, 3 bedtime  Sertraline 100 mg - 2 breakfast  Trazodone 100 mg Daily -  bedtime   Alpha lipoic acid 200 mg - 1 bedtime   One touch ultra test strips  Vials-       Xanax 0.5 mg -PRN   Patient declined the following medications (meds) due to (reason)  Albuterol - (adequate supply)  Vitamin B-Complex - no longer taking  Meloxicam -PRN (adequate supply)    Patient needs refills for None ID.  Confirmed delivery date of 10/20/2019, advised patient that pharmacy will contact them the morning of delivery.  [place] Pharmacist Assistant [phone]

## 2019-10-19 ENCOUNTER — Ambulatory Visit
Admission: RE | Admit: 2019-10-19 | Discharge: 2019-10-19 | Disposition: A | Discharge status: Home / Self Care | Payer: PPO | Source: Ambulatory Visit | Attending: Internal Medicine | Admitting: Internal Medicine

## 2019-10-19 ENCOUNTER — Other Ambulatory Visit: Payer: Self-pay

## 2019-10-19 DIAGNOSIS — R29890 Loss of height: Secondary | ICD-10-CM | POA: Diagnosis not present

## 2019-10-19 DIAGNOSIS — M8580 Other specified disorders of bone density and structure, unspecified site: Secondary | ICD-10-CM | POA: Diagnosis not present

## 2019-10-19 DIAGNOSIS — M8589 Other specified disorders of bone density and structure, multiple sites: Secondary | ICD-10-CM | POA: Diagnosis not present

## 2019-10-19 DIAGNOSIS — Z780 Asymptomatic menopausal state: Secondary | ICD-10-CM | POA: Diagnosis not present

## 2019-10-19 DIAGNOSIS — Z13820 Encounter for screening for osteoporosis: Secondary | ICD-10-CM

## 2019-10-19 DIAGNOSIS — Z853 Personal history of malignant neoplasm of breast: Secondary | ICD-10-CM | POA: Diagnosis not present

## 2019-10-19 DIAGNOSIS — E119 Type 2 diabetes mellitus without complications: Secondary | ICD-10-CM | POA: Diagnosis not present

## 2019-10-19 DIAGNOSIS — Z171 Estrogen receptor negative status [ER-]: Secondary | ICD-10-CM

## 2019-10-20 ENCOUNTER — Encounter: Payer: Self-pay | Admitting: Internal Medicine

## 2019-10-20 ENCOUNTER — Inpatient Hospital Stay: Payer: PPO | Admitting: Internal Medicine

## 2019-10-20 ENCOUNTER — Inpatient Hospital Stay: Payer: PPO | Attending: Internal Medicine

## 2019-10-20 ENCOUNTER — Other Ambulatory Visit: Payer: Self-pay | Admitting: *Deleted

## 2019-10-20 ENCOUNTER — Telehealth: Payer: Self-pay

## 2019-10-20 ENCOUNTER — Telehealth: Payer: Self-pay | Admitting: *Deleted

## 2019-10-20 DIAGNOSIS — Z8049 Family history of malignant neoplasm of other genital organs: Secondary | ICD-10-CM | POA: Diagnosis not present

## 2019-10-20 DIAGNOSIS — E119 Type 2 diabetes mellitus without complications: Secondary | ICD-10-CM | POA: Diagnosis not present

## 2019-10-20 DIAGNOSIS — E538 Deficiency of other specified B group vitamins: Secondary | ICD-10-CM | POA: Diagnosis not present

## 2019-10-20 DIAGNOSIS — M8580 Other specified disorders of bone density and structure, unspecified site: Secondary | ICD-10-CM | POA: Insufficient documentation

## 2019-10-20 DIAGNOSIS — Z171 Estrogen receptor negative status [ER-]: Secondary | ICD-10-CM | POA: Insufficient documentation

## 2019-10-20 DIAGNOSIS — Z923 Personal history of irradiation: Secondary | ICD-10-CM | POA: Diagnosis not present

## 2019-10-20 DIAGNOSIS — I10 Essential (primary) hypertension: Secondary | ICD-10-CM | POA: Insufficient documentation

## 2019-10-20 DIAGNOSIS — Z808 Family history of malignant neoplasm of other organs or systems: Secondary | ICD-10-CM | POA: Insufficient documentation

## 2019-10-20 DIAGNOSIS — Z87891 Personal history of nicotine dependence: Secondary | ICD-10-CM | POA: Insufficient documentation

## 2019-10-20 DIAGNOSIS — C50412 Malignant neoplasm of upper-outer quadrant of left female breast: Secondary | ICD-10-CM

## 2019-10-20 DIAGNOSIS — F418 Other specified anxiety disorders: Secondary | ICD-10-CM | POA: Diagnosis not present

## 2019-10-20 DIAGNOSIS — Z9221 Personal history of antineoplastic chemotherapy: Secondary | ICD-10-CM | POA: Insufficient documentation

## 2019-10-20 DIAGNOSIS — Z79899 Other long term (current) drug therapy: Secondary | ICD-10-CM | POA: Insufficient documentation

## 2019-10-20 DIAGNOSIS — D649 Anemia, unspecified: Secondary | ICD-10-CM | POA: Diagnosis not present

## 2019-10-20 DIAGNOSIS — E785 Hyperlipidemia, unspecified: Secondary | ICD-10-CM | POA: Insufficient documentation

## 2019-10-20 DIAGNOSIS — Z801 Family history of malignant neoplasm of trachea, bronchus and lung: Secondary | ICD-10-CM | POA: Diagnosis not present

## 2019-10-20 LAB — COMPREHENSIVE METABOLIC PANEL
ALT: 19 U/L (ref 0–44)
AST: 23 U/L (ref 15–41)
Albumin: 3.7 g/dL (ref 3.5–5.0)
Alkaline Phosphatase: 74 U/L (ref 38–126)
Anion gap: 10 (ref 5–15)
BUN: 19 mg/dL (ref 8–23)
CO2: 26 mmol/L (ref 22–32)
Calcium: 8.8 mg/dL — ABNORMAL LOW (ref 8.9–10.3)
Chloride: 103 mmol/L (ref 98–111)
Creatinine, Ser: 0.88 mg/dL (ref 0.44–1.00)
GFR calc Af Amer: >60 mL/min (ref >60)
GFR calc non Af Amer: >60 mL/min (ref >60)
Glucose, Bld: 127 mg/dL — ABNORMAL HIGH (ref 70–99)
Potassium: 4.2 mmol/L (ref 3.5–5.1)
Sodium: 139 mmol/L (ref 135–145)
Total Bilirubin: 0.3 mg/dL (ref 0.3–1.2)
Total Protein: 7.4 g/dL (ref 6.5–8.1)

## 2019-10-20 LAB — CBC WITH DIFFERENTIAL/PLATELET
Abs Immature Granulocytes: 0.02 10*3/uL (ref 0.00–0.07)
Basophils Absolute: 0 10*3/uL (ref 0.0–0.1)
Basophils Relative: 0 %
Eosinophils Absolute: 0.1 10*3/uL (ref 0.0–0.5)
Eosinophils Relative: 3 %
HCT: 32.4 % — ABNORMAL LOW (ref 36.0–46.0)
Hemoglobin: 10.5 g/dL — ABNORMAL LOW (ref 12.0–15.0)
Immature Granulocytes: 0 %
Lymphocytes Relative: 16 %
Lymphs Abs: 0.8 10*3/uL (ref 0.7–4.0)
MCH: 28.4 pg (ref 26.0–34.0)
MCHC: 32.4 g/dL (ref 30.0–36.0)
MCV: 87.6 fL (ref 80.0–100.0)
Monocytes Absolute: 0.3 10*3/uL (ref 0.1–1.0)
Monocytes Relative: 6 %
Neutro Abs: 4 10*3/uL (ref 1.7–7.7)
Neutrophils Relative %: 75 %
Platelets: 178 10*3/uL (ref 150–400)
RBC: 3.7 MIL/uL — ABNORMAL LOW (ref 3.87–5.11)
RDW: 15.1 % (ref 11.5–15.5)
WBC: 5.4 10*3/uL (ref 4.0–10.5)
nRBC: 0 % (ref 0.0–0.2)

## 2019-10-20 MED ORDER — GLUCOSE BLOOD VI STRP: ORAL_STRIP | 3 refills | Status: DC

## 2019-10-20 NOTE — Addendum Note (Signed)
Addended by: [name] A on: 10/20/2019 01:10 PM   Modules accepted: Orders

## 2019-10-20 NOTE — Telephone Encounter (Signed)
Opened in error

## 2019-10-20 NOTE — Chronic Care Management (AMB) (Signed)
Patient is using [facility] for packaging and delivery service.   Patient is out of refills on One Touch Ultra Test strips.   Please send prescription to [facility] in [place].   Thank you,  Sherre Poot, PharmD, [facility] Clinical Pharmacist [facility] [phone] (office) [phone] (mobile)

## 2019-10-20 NOTE — Assessment & Plan Note (Addendum)
#   Left breast cancer stage III  TRIPLE NEGATIVE-  s/p neoadjuvant chemotherapy-currently under surveillance. Feb 2021- Mammo- WNL.  STABLE.  #Continue close surveillance evidence of recurrence.    # Mild anemia -hemoglobin 10-11 for the last 10 years; stable.  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    # OSTEOPENIA- AUG 2021- T score -1.8; recommend taking ca 500 mg BID; along with Vit D. Discussed the potential risk factors for osteoporosis/ostepenia- age/gender/postmenopausal status/chemotherapy. Discussed multiple options including exercise/ calcium and vitamin D supplementation/ and also use of bisphosphonates. Discussed oral bisphosphonates versus parenteral bisphosphonate like Reclast/ RANK ligand inhibitors- desnosumab. Discussed the potential benefits and/side effects  Including but not limited to Osteonecrosis of jaw/ hypocalcemia. Pt in interested in reclast; will plan next week.  Recommend continue to take vitamin D 1000 units a day; and had calcium 500 mg twice a day.   # DISPOSITION: # Reclast infusion next week # follow up in 7 m with MD/labs-cbc/cmp/ca-27-29; Reclast--Dr.B

## 2019-10-20 NOTE — Telephone Encounter (Signed)
Patient requesting refill for EMLA cream order pended per MD approval.

## 2019-10-20 NOTE — Progress Notes (Signed)
Suzanne Strong OFFICE PROGRESS NOTE  Patient Care Team: Tower, [name], MD as PCP - General Byrnett, [name], MD (General Surgery) [name], MD (Inactive) (Oncology) Alda Berthold, DO as Consulting Physician (Neurology) Cammie Sickle, MD as Consulting Physician (Oncology) [name], [facility] (Pharmacist) Noreene Filbert, MD as Radiation Oncologist (Radiation Oncology)  Cancer Staging No matching staging information was found for the patient.   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  5 CM TUMOR MASS BIOPSIES POSITIVE FOR INVASIVE MAMMARY CARCINOMA TRIPLE NEGATIVE DISEASE(jANUARY, 2017)stage IIIa. ER/PR/her2 Neu-NEGATIVE  # Cytoxan and Adriamycin from March 18, 2015; carbo-taxol x4 cycles  # AUg 18th s/p Lumpec & SLNBx- Complete path CR [ypT0 ypsn0] s/p RT [oct 2017; Dr.Crystal]  3.  MUGA scan of the heart shows ejection fraction to be 61% (January, 2017)   ------------------------------------------------------------    DIAGNOSIS: BREAST CANCER  STAGE:  III       ;GOALS: curative  CURRENT/MOST RECENT THERAPY: surveillaince.     Carcinoma of upper-outer quadrant of left breast in female, estrogen receptor negative ([place])  11/21/2015 Initial Diagnosis   Carcinoma of upper-outer quadrant of left breast in female, estrogen receptor negative ([place])     INTERVAL HISTORY:  Suzanne Strong 74 y.o.  female pleasant patient above history of triple negative breast cancer stage III currently on surveillance is here for follow-up/review results of the bone density.  Patient appetite is good.  No weight loss.  Nausea vomiting.  Chronic mild tingling and numbness in extremities.  Chronic joint pains back pain not any worse.  No  headaches.  Review of Systems  Constitutional: Negative for chills, diaphoresis, fever, malaise/fatigue and weight loss.  HENT: Negative for nosebleeds and sore throat.   Eyes: Negative for double vision.  Respiratory: Negative for hemoptysis, shortness of breath and wheezing.   Cardiovascular: Negative for chest pain, palpitations, orthopnea and leg swelling.  Gastrointestinal: Negative for abdominal pain, blood in stool, constipation, diarrhea, heartburn, melena, nausea and vomiting.  Genitourinary: Negative for dysuria, frequency and urgency.  Musculoskeletal: Negative for back pain and joint pain.  Skin: Negative.  Negative for itching and rash.  Neurological: Positive for tingling. Negative for focal weakness, weakness and headaches.  Endo/Heme/Allergies: Does not bruise/bleed easily.  Psychiatric/Behavioral: Negative for depression. The patient is not nervous/anxious and does not have insomnia.       PAST MEDICAL HISTORY :  Past Medical History:  Diagnosis Date   Anemia    iron deficiency and B12 deficiency   Anxiety    Breast cancer ([place]) 02/25/2015   upper-outer quadrant of left female breast, Triple Negative. Neo-adjuvant chemo with complete pathologic response.    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   BREAST CYST ASPIRATION     BREAST EXCISIONAL BIOPSY Left 12/2015   surgery   BREAST LUMPECTOMY WITH NEEDLE LOCALIZATION Left 10/02/2015   Procedure: BREAST LUMPECTOMY WITH NEEDLE LOCALIZATION;  Surgeon: [name], MD;  Location: [facility];  Service: General;  Laterality: Left;   BREAST LUMPECTOMY WITH SENTINEL LYMPH NODE BIOPSY Left 10/02/2015   Procedure: LEFT BREAST WIDE EXCISION WITH SENTINEL LYMPH NODE BX;  Surgeon: [name], MD;  Location: [facility];  Service: General;  Laterality: Left;   BREAST SURGERY     breast biopsy benign   CARPAL TUNNEL RELEASE Right 05/19/2017   Procedure: CARPAL TUNNEL RELEASE;  Surgeon: Earnestine Leys, MD;  Location: [facility];  Service: Orthopedics;  Laterality: Right;   CATARACT EXTRACTION W/PHACO Right 10/26/2018   Procedure: CATARACT EXTRACTION PHACO AND INTRAOCULAR LENS PLACEMENT ([place]) right diabetic;  Surgeon: Leandrew Koyanagi, MD;  Location: [place];  Service: Ophthalmology;  Laterality: Right;  Diabetic - oral meds cancer center been notified and will come   CATARACT EXTRACTION W/PHACO Left 11/16/2018   Procedure: CATARACT EXTRACTION PHACO AND INTRAOCULAR LENS PLACEMENT ([facility]) LEFT DIABETIC  01:01.0  17.0%  10.37;  Surgeon: Leandrew Koyanagi, MD;  Location: [place];  Service: Ophthalmology;  Laterality: Left;  Diabetic - oral meds Port-a-cath   CHOLECYSTECTOMY     Cologuard  07/22/2016   Negative   ESOPHAGOGASTRODUODENOSCOPY  07/2009   normal with few gastric polyps   MOLE REMOVAL     PORT-A-CATH REMOVAL     PORTACATH PLACEMENT     PORTACATH PLACEMENT Right 03/12/2015   Procedure: INSERTION PORT-A-CATH;  Surgeon: [name], MD;  Location: [facility];  Service: General;  Laterality: Right;   RADICAL NECK DISSECTION     TONSILLECTOMY     cancer treated with chemo and radiation   TRIGGER FINGER RELEASE Right 05/19/2017   Procedure: RELEASE TRIGGER FINGER/A-1 PULLEY ring finger;   Surgeon: Earnestine Leys, MD;  Location: [facility];  Service: Orthopedics;  Laterality: Right;    FAMILY HISTORY :   Family History  Problem Relation Age of Onset   Osteoporosis Mother    Hyperlipidemia Mother    Depression Mother    Cancer Mother        skin cancer ? basal cell and lung ca heavy smoker   Alcohol abuse Father    Cancer Father        skin CA ? basal cell   Heart disease Father        CHF   Hyperlipidemia Brother    Hypertension Brother    Breast cancer Neg Hx     SOCIAL HISTORY:   Social History   Tobacco Use   Smoking status: Former Smoker    Packs/day: 0.50    Years: 6.00    Pack years: 3.00    Types: Cigarettes    Quit date: 03/10/1984    Years since quitting: 35.6   Smokeless tobacco: Never Used  Vaping Use   Vaping Use: Never used  Substance Use Topics   Alcohol use: No    Alcohol/week: 0.0 standard drinks   Drug use: No    ALLERGIES:  is allergic to amoxicillin, fluconazole, difuleron [ferrous fumarate-dss], and other.  MEDICATIONS:  Current Outpatient Medications  Medication Sig Dispense Refill  albuterol (VENTOLIN HFA) 108 (90 Base) MCG/ACT inhaler Inhale 2 puffs into the lungs every 6 (six) hours as needed for wheezing or shortness of breath (tight chest). 18 g 3   ALPRAZolam (XANAX) 0.5 MG tablet Take 1 tablet (0.5 mg total) by mouth 2 (two) times daily as needed for anxiety or sleep. 60 tablet 5   amitriptyline (ELAVIL) 50 MG tablet Take 1 tablet (50 mg total) by mouth at bedtime. 90 tablet 3   atorvastatin (LIPITOR) 10 MG tablet TAKE 1 TABLET(10 MG) BY MOUTH DAILY 90 tablet 3   b complex vitamins tablet Take 1 tablet by mouth daily.       busPIRone (BUSPAR) 15 MG tablet Take 1/3 tablet p.o. twice daily for 1 week, then take 2/3 tablet p.o. twice daily for 1 week, then take 1 tablet p.o. twice daily 60 tablet 1   Cholecalciferol (VITAMIN D-1000 MAX ST) 25 MCG (1000 UT) tablet Take 2,000 Units by mouth daily.       glipiZIDE (GLUCOTROL) 10 MG tablet Take 10 mg by mouth 2 (two) times daily before a meal.      JANUMET XR 50-1000 MG TB24 Take 1 tablet by mouth 2 (two) times daily.   0   lidocaine-prilocaine (EMLA) cream Apply 1 application topically as needed. Apply to port when going through Chemo     MELOXICAM PO Take by mouth as needed.     Multiple Vitamin (MULTIVITAMIN) tablet Take 1 tablet by mouth daily.     Multiple Vitamins-Minerals (WOMENS MULTIVITAMIN PO) Take by mouth.     omeprazole (PRILOSEC) 20 MG capsule TAKE 1 CAPSULE(20 MG) BY MOUTH AT BEDTIME 90 capsule 3   rOPINIRole (REQUIP) 1 MG tablet Take 3 pills by mouth daily at bedtime and 1/2 pill at lunch 315 tablet 0   sertraline (ZOLOFT) 100 MG tablet TAKE 2 TABLETS BY MOUTH EVERY DAY 180 tablet 1   traZODone (DESYREL) 100 MG tablet TAKE 1/2 TO 1 (ONE-HALF TO ONE) TABLET BY MOUTH EVERY DAY AT BEDTIME AS NEEDED FOR INSOMNIA 90 tablet 1   glucose blood test strip To check blood glucose once daily and prn for diabetes 100 each 3   pregabalin (LYRICA) 150 MG capsule Take 1 capsule by mouth 2 (two) times a day.     No current facility-administered medications for this visit.   Facility-Administered Medications Ordered in Other Visits  Medication Dose Route Frequency Provider Last Rate Last Admin   sodium chloride flush (NS) 0.9 % injection 10 mL  10 mL Intravenous PRN Cammie Sickle, MD   10 mL at 09/11/15 0845    PHYSICAL EXAMINATION: ECOG PERFORMANCE STATUS: 1 - Symptomatic but completely ambulatory  BP 131/78 (BP Location: Left Arm, Patient Position: Sitting, Cuff Size: Large)    Pulse 74    Temp 97.6 F (36.4 C) (Tympanic)    Resp 18    Ht _0  (1.676 m)    Wt 218 lb (98.9 kg)    SpO2 96%    BMI 35.19 kg/m   Filed Weights   10/20/19 1024  Weight: 218 lb (98.9 kg)    Physical Exam Constitutional:      Comments: Obese.  She is walking herself.  Alone.  HENT:     Head: Normocephalic and atraumatic.      Mouth/Throat:     Pharynx: No oropharyngeal exudate.  Eyes:     Pupils: Pupils are equal, round, and reactive to light.  Cardiovascular:     Rate and  Rhythm: Normal rate and regular rhythm.  Pulmonary:     Effort: No respiratory distress.     Breath sounds: No wheezing.  Abdominal:     General: Bowel sounds are normal. There is no distension.     Palpations: Abdomen is soft. There is no mass.     Tenderness: There is no abdominal tenderness. There is no guarding or rebound.  Musculoskeletal:        General: No tenderness. Normal range of motion.     Cervical back: Normal range of motion and neck supple.  Skin:    General: Skin is warm.  Neurological:     Mental Status: She is alert and oriented to person, place, and time.  Psychiatric:        Mood and Affect: Affect normal.     LABORATORY DATA:  I have reviewed the data as listed    Component Value Date/Time   NA 139 10/20/2019 0950   NA 142 03/01/2014 0846   K 4.2 10/20/2019 0950   K 4.3 03/01/2014 0846   CL 103 10/20/2019 0950   CL 102 03/01/2014 0846   CO2 26 10/20/2019 0950   CO2 28 03/01/2014 0846   GLUCOSE 127 (H) 10/20/2019 0950   GLUCOSE 219 (H) 03/01/2014 0846   BUN 19 10/20/2019 0950   BUN 12 03/01/2014 0846   CREATININE 0.88 10/20/2019 0950   CREATININE 0.95 03/01/2014 0846   CALCIUM 8.8 (L) 10/20/2019 0950   CALCIUM 8.6 03/01/2014 0846   PROT 7.4 10/20/2019 0950   PROT 7.5 03/01/2014 0846   ALBUMIN 3.7 10/20/2019 0950   ALBUMIN 3.4 03/01/2014 0846   AST 23 10/20/2019 0950   AST 20 03/01/2014 0846   ALT 19 10/20/2019 0950   ALT 28 03/01/2014 0846   ALKPHOS 74 10/20/2019 0950   ALKPHOS 122 (H) 03/01/2014 0846   BILITOT 0.3 10/20/2019 0950   BILITOT 0.2 03/01/2014 0846   GFRNONAA >60 10/20/2019 0950   GFRNONAA >60 03/01/2014 0846   GFRNONAA >60 08/24/2013 1015   GFRAA >60 10/20/2019 0950   GFRAA >60 03/01/2014 0846   GFRAA >60 08/24/2013 1015    No results found for: SPEP, UPEP  Lab Results   Component Value Date   WBC 5.4 10/20/2019   NEUTROABS 4.0 10/20/2019   HGB 10.5 (L) 10/20/2019   HCT 32.4 (L) 10/20/2019   MCV 87.6 10/20/2019   PLT 178 10/20/2019      Chemistry      Component Value Date/Time   NA 139 10/20/2019 0950   NA 142 03/01/2014 0846   K 4.2 10/20/2019 0950   K 4.3 03/01/2014 0846   CL 103 10/20/2019 0950   CL 102 03/01/2014 0846   CO2 26 10/20/2019 0950   CO2 28 03/01/2014 0846   BUN 19 10/20/2019 0950   BUN 12 03/01/2014 0846   CREATININE 0.88 10/20/2019 0950   CREATININE 0.95 03/01/2014 0846      Component Value Date/Time   CALCIUM 8.8 (L) 10/20/2019 0950   CALCIUM 8.6 03/01/2014 0846   ALKPHOS 74 10/20/2019 0950   ALKPHOS 122 (H) 03/01/2014 0846   AST 23 10/20/2019 0950   AST 20 03/01/2014 0846   ALT 19 10/20/2019 0950   ALT 28 03/01/2014 0846   BILITOT 0.3 10/20/2019 0950   BILITOT 0.2 03/01/2014 0846       RADIOGRAPHIC STUDIES: I have personally reviewed the radiological images as listed and agreed with the findings in the report. DG Bone Density  Result  Date: 10/19/2019 EXAM: DUAL X-RAY ABSORPTIOMETRY (DXA) FOR BONE MINERAL DENSITY IMPRESSION: Your patient Suzanne Strong completed a BMD test on 10/19/2019 using the [place] (software version: 14.10) manufactured by [employer]. The following summarizes the results of our evaluation. Technologist:VLM PATIENT BIOGRAPHICAL: Name: Suzanne Strong, Suzanne Strong Patient ID: [id number] Birth Date: July 19, 1945 Height:     64.0 in. Gender: Female Exam Date: 10/19/2019 Weight:     216.0 lbs. Indications: Caucasian, Diabetic, Height Loss, History of Breast Cancer Fractures: Left wrist Treatments: Albuterol, Multi-Vitamin, Vitamin D DENSITOMETRY RESULTS: Site         Region     Measured Date Measured Age WHO Classification Young Adult T-score BMD         %Change vs. Previous Significant Change (*) DualFemur Neck Right 10/19/2019 71.8 Normal 0.6 1.118 g/cm2 - - Left Forearm Radius 33%  10/19/2019 71.8 Osteopenia -1.8 0.722 g/cm2 - - ASSESSMENT: The BMD measured at Forearm Radius 33% is 0.722 g/cm2 with a T-score of -1.8. This patient is considered OSTEOPENIC according to [facility]) criteria. Lumbar spine was not utilized due to advanced degenerative changes. The scan quality is good. World Pharmacologist [facility]) criteria for post-menopausal, Caucasian Women: Normal:                   T-score at or above -1 SD Osteopenia/low bone mass: T-score between -1 and -2.5 SD Osteoporosis:             T-score at or below -2.5 SD RECOMMENDATIONS: 1. All patients should optimize calcium and vitamin D intake. 2. Consider FDA-approved medical therapies in postmenopausal women and men aged 29 years and older, based on the following: a. A hip or vertebral(clinical or morphometric) fracture b. T-score < -2.5 at the femoral neck or spine after appropriate evaluation to exclude secondary causes c. Low bone mass (T-score between -1.0 and -2.5 at the femoral neck or spine) and a 10-year probability of a hip fracture > 3% or a 10-year probability of a major osteoporosis-related fracture > 20% based on the [place]-adapted WHO algorithm 3. Clinician judgment and/or patient preferences may indicate treatment for people with 10-year fracture probabilities above or below these levels FOLLOW-UP: People with diagnosed cases of osteoporosis or at high risk for fracture should have regular bone mineral density tests. For patients eligible for Medicare, routine testing is allowed once every 2 years. The testing frequency can be increased to one year for patients who have rapidly progressing disease, those who are receiving or discontinuing medical therapy to restore bone mass, or have additional risk factors. I have reviewed this report, and agree with the above findings. Mark A. Thornton Papas, M.D. [facility] Dear Cammie Sickle, Your patient Suzanne Strong completed a FRAX assessment on  10/19/2019 using the [place] (analysis version: 14.10) manufactured by [employer]. The following summarizes the results of our evaluation. PATIENT BIOGRAPHICAL: Name: Suzanne Strong, Suzanne Strong Patient ID: [id number] Birth Date: 1945/06/09 Height:    64.0 in. Gender:     Female    [age]       Weight:    216.0 lbs. Ethnicity:  White                            Exam Date: 10/19/2019 FRAX* RESULTS:  (version: 3.5) 10-year Probability of Fracture1 Major Osteoporotic Fracture2 Hip Fracture 5.9% 0.2% Population: [place] (  Caucasian) Risk Factors: None Based on Femur (Right) Neck BMD 1 -The 10-year probability of fracture may be lower than reported if the patient has received treatment. 2 -Major Osteoporotic Fracture: Clinical Spine, Forearm, Hip or Shoulder *FRAX is a Materials engineer of the [employer] for Metabolic Bone Disease, a [place] (WHO) [facility]. ASSESSMENT: The probability of a major osteoporotic fracture is 5.9% within the next ten years. The probability of a hip fracture is 0.2% within the next ten years. I have reviewed this report and agree with the above findings. Mark A. Thornton Papas, M.D. [facility] Electronically Signed   By: Lavonia Dana M.D.   On: 10/19/2019 12:27     ASSESSMENT & PLAN:  Carcinoma of upper-outer quadrant of left breast in female, estrogen receptor negative ([place]) # Left breast cancer stage III  TRIPLE NEGATIVE-  s/p neoadjuvant chemotherapy-currently under surveillance. Feb 2021- Mammo- WNL.  STABLE.  #Continue close surveillance evidence of recurrence.    # Mild anemia -hemoglobin 10-11 for the last 10 years; stable.  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    # OSTEOPENIA- AUG 2021- T score -1.8; recommend taking ca 500 mg BID; along with Vit D. Discussed the potential risk  factors for osteoporosis/ostepenia- age/gender/postmenopausal status/chemotherapy. Discussed multiple options including exercise/ calcium and vitamin D supplementation/ and also use of bisphosphonates. Discussed oral bisphosphonates versus parenteral bisphosphonate like Reclast/ RANK ligand inhibitors- desnosumab. Discussed the potential benefits and/side effects  Including but not limited to Osteonecrosis of jaw/ hypocalcemia. Pt in interested in reclast; will plan next week.  Recommend continue to take vitamin D 1000 units a day; and had calcium 500 mg twice a day.   # DISPOSITION: # Reclast infusion next week # follow up in 7 m with MD/labs-cbc/cmp/ca-27-29; Reclast--Dr.B   Orders Placed This Encounter  Procedures   CBC with Differential/Platelet    Standing Status:   Future    Standing Expiration Date:   10/19/2020   Comprehensive metabolic panel    Standing Status:   Future    Standing Expiration Date:   10/19/2020   Cancer antigen 27.29    Standing Status:   Future    Standing Expiration Date:   10/19/2020   All questions were answered. The patient knows to call the clinic with any problems, questions or concerns.      Cammie Sickle, MD 10/20/2019 1:48 PM

## 2019-10-21 LAB — CANCER ANTIGEN 27.29: CA 27.29: 10.3 U/mL (ref 0.0–38.6)

## 2019-10-30 DIAGNOSIS — R682 Dry mouth, unspecified: Secondary | ICD-10-CM | POA: Diagnosis not present

## 2019-10-30 DIAGNOSIS — Z85818 Personal history of malignant neoplasm of other sites of lip, oral cavity, and pharynx: Secondary | ICD-10-CM | POA: Diagnosis not present

## 2019-10-31 ENCOUNTER — Inpatient Hospital Stay: Payer: PPO

## 2019-11-01 ENCOUNTER — Telehealth (INDEPENDENT_AMBULATORY_CARE_PROVIDER_SITE_OTHER): Payer: PPO | Admitting: Psychiatry

## 2019-11-01 ENCOUNTER — Encounter: Payer: Self-pay | Admitting: Psychiatry

## 2019-11-01 ENCOUNTER — Telehealth: Payer: Self-pay | Admitting: Psychiatry

## 2019-11-01 DIAGNOSIS — G4700 Insomnia, unspecified: Secondary | ICD-10-CM | POA: Diagnosis not present

## 2019-11-01 DIAGNOSIS — F411 Generalized anxiety disorder: Secondary | ICD-10-CM | POA: Diagnosis not present

## 2019-11-01 MED ORDER — BUSPIRONE HCL 15 MG PO TABS: 15.0000 mg | ORAL_TABLET | Freq: Two times a day (BID) | ORAL | 0 refills | Status: DC

## 2019-11-01 MED ORDER — BELSOMRA 20 MG PO TABS: 20.0000 mg | ORAL_TABLET | Freq: Every day | ORAL | 0 refills | Status: DC

## 2019-11-01 MED ORDER — BELSOMRA 15 MG PO TABS: 15.0000 mg | ORAL_TABLET | Freq: Every day | ORAL | 0 refills | Status: DC

## 2019-11-01 NOTE — Telephone Encounter (Signed)
RX for Belsomra 15mg  and 20mg  tabs and coupon card were faxed to [facility] per [facility]

## 2019-11-01 NOTE — Progress Notes (Signed)
Suzanne Strong [id number] Mar 01, 1945 74 y.o.  Virtual Visit via Telephone Note  I connected with pt on 11/01/19 at  9:30 AM EDT by telephone and verified that I am speaking with the correct person using two identifiers.   I discussed the limitations, risks, security and privacy concerns of performing an evaluation and management service by telephone and the availability of in person appointments. I also discussed with the patient that there may be a patient responsible charge related to this service. The patient expressed understanding and agreed to proceed.   I discussed the assessment and treatment plan with the patient. The patient was provided an opportunity to ask questions and all were answered. The patient agreed with the plan and demonstrated an understanding of the instructions.   The patient was advised to call back or seek an in-person evaluation if the symptoms worsen or if the condition fails to improve as anticipated.  I provided 30 minutes of non-face-to-face time during this encounter.  The patient was located at home.  The provider was located at [place].   Thayer Headings, PMHNP   Subjective:   Patient ID:  Suzanne Strong is a 74 y.o. (DOB 1945-08-18) female.  Chief Complaint:  Chief Complaint  Patient presents with  . Insomnia  . Follow-up    Anxiety    HPI Suzanne Strong presents for follow-up of insomnia and h/o depression and anxiety. She reports that she continues to have some difficulty falling asleep. She has had some middle of the night awakenings over the last couple of weeks and feels that she has to move around without feeling anxious. Typically goes to bed around 11 pm after taking medications around 10 pm. Often awakens around 2-3 am. She reports that Buspar seems to have helped some with anxiety. She reports that her restlessness has been less in the afternoons. She reports that she does not notice much anxiety. Rare anxious  thoughts. She reports that she occasionally has moments that she feels like she has to find her husband and then recalls he is deceased. Continues to grieve the loss of her husband. Denies depressed mood beyond grieving. Appetite has been ok. Enjoys cooking. Energy and motivation have been good. She reports that she has been doing some new things at work. Concentration has been good. Denies SI.   She reports that she has been having some difficulty with balance over the past 2-3 weeks.   She reports that she is receiving support from her church and friends.   Taking Xanax prn about 3-4 times a week as needed.  Past Psychiatric Medication Trials: Remeron Xanax Gabapentin- Unsure if this is helpful for RLS Lyrica Ropinorole- Helpful for RLS Zoloft Lyrica Trazodone-Ineffective Amitriptyline  Review of Systems:  Review of Systems  Constitutional: Negative for chills and fever.  HENT: Positive for voice change.   Respiratory: Positive for cough.   Endocrine:       Recent hypoglycemia upon awakening  Musculoskeletal: Negative for gait problem.  Neurological: Negative for tremors.       Difficulty with balance  Psychiatric/Behavioral:       Please refer to HPI    Medications: I have reviewed the patient's current medications.  Current Outpatient Medications  Medication Sig Dispense Refill  . albuterol (VENTOLIN HFA) 108 (90 Base) MCG/ACT inhaler Inhale 2 puffs into the lungs every 6 (six) hours as needed for wheezing or shortness of breath (tight chest). 18 g 3  . ALPRAZolam (XANAX) 0.5 MG tablet  Take 1 tablet (0.5 mg total) by mouth 2 (two) times daily as needed for anxiety or sleep. 60 tablet 5  . amitriptyline (ELAVIL) 50 MG tablet Take 1 tablet (50 mg total) by mouth at bedtime. 90 tablet 3  . atorvastatin (LIPITOR) 10 MG tablet TAKE 1 TABLET(10 MG) BY MOUTH DAILY 90 tablet 3  . b complex vitamins tablet Take 1 tablet by mouth daily.      . busPIRone (BUSPAR) 15 MG tablet Take  1 tablet (15 mg total) by mouth 2 (two) times daily. 180 tablet 0  . Cholecalciferol (VITAMIN D-1000 MAX ST) 25 MCG (1000 UT) tablet Take 2,000 Units by mouth daily.     [name] glipiZIDE (GLUCOTROL) 10 MG tablet Take 10 mg by mouth 2 (two) times daily before a meal.     . glucose blood test strip To check blood glucose once daily and prn for diabetes 100 each 3  . JANUMET XR 50-1000 MG TB24 Take 1 tablet by mouth 2 (two) times daily.   0  . lidocaine-prilocaine (EMLA) cream Apply 1 application topically as needed. Apply to port when going through Chemo    . MELOXICAM PO Take by mouth as needed.    . Multiple Vitamin (MULTIVITAMIN) tablet Take 1 tablet by mouth daily.    . Multiple Vitamins-Minerals (WOMENS MULTIVITAMIN PO) Take by mouth.    [name] omeprazole (PRILOSEC) 20 MG capsule TAKE 1 CAPSULE(20 MG) BY MOUTH AT BEDTIME 90 capsule 3  . pregabalin (LYRICA) 150 MG capsule Take 1 capsule by mouth 2 (two) times a day.    [name] rOPINIRole (REQUIP) 1 MG tablet Take 3 pills by mouth daily at bedtime and 1/2 pill at lunch 315 tablet 0  . sertraline (ZOLOFT) 100 MG tablet TAKE 2 TABLETS BY MOUTH EVERY DAY 180 tablet 1  . Suvorexant (BELSOMRA) 15 MG TABS Take 15 mg by mouth at bedtime. 10 tablet 0  . Suvorexant (BELSOMRA) 20 MG TABS Take 20 mg by mouth at bedtime for 10 days. 10 tablet 0   No current facility-administered medications for this visit.   Facility-Administered Medications Ordered in Other Visits  Medication Dose Route Frequency Provider Last Rate Last Admin  . sodium chloride flush (NS) 0.9 % injection 10 mL  10 mL Intravenous PRN Cammie Sickle, MD   10 mL at 09/11/15 0845    Medication Side Effects: Other: Possible ortostatic hypotension  Allergies:  Allergies  Allergen Reactions  . Amoxicillin Swelling    REACTION: rash, swelling Has patient had a PCN reaction causing immediate rash, facial/tongue/throat swelling, SOB or lightheadedness with hypotension: yes Has patient had a PCN  reaction causing severe rash involving mucus membranes or skin necrosis: no Has patient had a PCN reaction that required hospitalization: no Has patient had a PCN reaction occurring within the last 10 years: yes If all of the above answers are "NO", then may proceed with Cephalosporin use.   [name] Fluconazole Rash    REACTION: hives  . Difuleron [Ferrous Fumarate-Dss]   . 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 Anemia    iron deficiency and B12 deficiency  . Anxiety   . Breast cancer ([place]) 02/25/2015   upper-outer quadrant of left female breast, Triple Negative. Neo-adjuvant chemo with complete pathologic response.   . Cervical dysplasia    conization  . Diabetes mellitus without  complication ([place])   . Diverticulosis   . Fever 04/11/2015  . Gastroparesis    related to previous radiation tx  . GERD (gastroesophageal reflux disease)   . Hyperlipidemia   . Hypertension   . Neuropathy    legs/feet, S/P chemo meds  . Personal history of chemotherapy   . Personal history of radiation therapy 2017   LEFT lumpectomy w/ radiation  . RLS (restless legs syndrome)   . Shingles    Hx of  . 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 Wears dentures    partial bottom    Family History  Problem Relation Age of Onset  . Osteoporosis Mother   . Hyperlipidemia Mother   . Depression Mother   . Cancer Mother        skin cancer ? basal cell and lung ca heavy smoker  . Alcohol abuse Father   . Cancer Father        skin CA ? basal cell  . Heart disease Father        CHF  . Hyperlipidemia Brother   . Hypertension Brother   . Breast cancer Neg Hx     Social History   Socioeconomic History  . Marital status: Widowed    Spouse name: Not on file  . Number of children: 0  . Years of education: 32  . Highest education level: Not on file  Occupational History  . Occupation: Clinical cytogeneticist   Tobacco Use  . Smoking status: Former Smoker    Packs/day: 0.50    Years: 6.00    Pack years: 3.00    Types: Cigarettes    Quit date: 03/10/1984    Years since quitting: 35.6  . Smokeless tobacco: Never Used  Vaping Use  . Vaping Use: Never used  Substance and Sexual Activity  . Alcohol use: No    Alcohol/week: 0.0 standard drinks  . Drug use: No  . Sexual activity: Not Currently  Other Topics Concern  . Not on file  Social History Narrative   Lives with husband in a one story home.  No children.        Works as Radiation protection practitioner for [employer].        Education: college.    Social Determinants of Health   Financial Resource Strain: Low Risk   . Difficulty of Paying Living Expenses: Not very hard  Food Insecurity:   . Worried About Charity fundraiser in the Last Year: Not on file  . Ran Out of Food in the Last Year: Not on file  Transportation Needs:   . Lack of Transportation (Medical): Not on file  . Lack of Transportation (Non-Medical): Not on file  Physical Activity:   . Days of Exercise per Week: Not on file  . Minutes of Exercise per Session: Not on file  Stress:   . Feeling of Stress : Not on file  Social Connections:   . Frequency of Communication with Friends and Family: Not on file  . Frequency of Social Gatherings with Friends and Family: Not on file  . Attends Religious Services: Not on file  . Active Member of Clubs or Organizations: Not on file  . Attends Archivist Meetings: Not on file  . Marital Status: Not on file  Intimate Partner Violence:   . Fear of Current or Ex-Partner: Not on file  . Emotionally Abused: Not on file  . Physically Abused: Not on file  . Sexually Abused: Not on file  Past Medical History, Surgical history, Social history, and Family history were reviewed and updated as appropriate.   Please see review of systems for further details on the patient's review from today.   Objective:   Physical Exam:  There  were no vitals taken for this visit.  Physical Exam Neurological:     Mental Status: She is alert and oriented to person, place, and time.     Cranial Nerves: No dysarthria.  Psychiatric:        Attention and Perception: Attention and perception normal.        Speech: Speech normal.        Behavior: Behavior is cooperative.        Thought Content: Thought content normal. Thought content is not paranoid or delusional. Thought content does not include homicidal or suicidal ideation. Thought content does not include homicidal or suicidal plan.        Cognition and Memory: Cognition and memory normal.        Judgment: Judgment normal.     Comments: Insight intact Mood is appropriate to content. Continues to grieve loss of husband     Lab Review:     Component Value Date/Time   NA 139 10/20/2019 0950   NA 142 03/01/2014 0846   K 4.2 10/20/2019 0950   K 4.3 03/01/2014 0846   CL 103 10/20/2019 0950   CL 102 03/01/2014 0846   CO2 26 10/20/2019 0950   CO2 28 03/01/2014 0846   GLUCOSE 127 (H) 10/20/2019 0950   GLUCOSE 219 (H) 03/01/2014 0846   BUN 19 10/20/2019 0950   BUN 12 03/01/2014 0846   CREATININE 0.88 10/20/2019 0950   CREATININE 0.95 03/01/2014 0846   CALCIUM 8.8 (L) 10/20/2019 0950   CALCIUM 8.6 03/01/2014 0846   PROT 7.4 10/20/2019 0950   PROT 7.5 03/01/2014 0846   ALBUMIN 3.7 10/20/2019 0950   ALBUMIN 3.4 03/01/2014 0846   AST 23 10/20/2019 0950   AST 20 03/01/2014 0846   ALT 19 10/20/2019 0950   ALT 28 03/01/2014 0846   ALKPHOS 74 10/20/2019 0950   ALKPHOS 122 (H) 03/01/2014 0846   BILITOT 0.3 10/20/2019 0950   BILITOT 0.2 03/01/2014 0846   GFRNONAA >60 10/20/2019 0950   GFRNONAA >60 03/01/2014 0846   GFRNONAA >60 08/24/2013 1015   GFRAA >60 10/20/2019 0950   GFRAA >60 03/01/2014 0846   GFRAA >60 08/24/2013 1015       Component Value Date/Time   WBC 5.4 10/20/2019 0950   RBC 3.70 (L) 10/20/2019 0950   HGB 10.5 (L) 10/20/2019 0950   HGB 11.5 (L)  03/01/2014 0846   HCT 32.4 (L) 10/20/2019 0950   HCT 36.4 03/01/2014 0846   PLT 178 10/20/2019 0950   PLT 254 03/01/2014 0846   MCV 87.6 10/20/2019 0950   MCV 81 03/01/2014 0846   MCH 28.4 10/20/2019 0950   MCHC 32.4 10/20/2019 0950   RDW 15.1 10/20/2019 0950   RDW 15.6 (H) 03/01/2014 0846   LYMPHSABS 0.8 10/20/2019 0950   LYMPHSABS 1.0 03/01/2014 0846   MONOABS 0.3 10/20/2019 0950   MONOABS 0.3 03/01/2014 0846   EOSABS 0.1 10/20/2019 0950   EOSABS 0.1 03/01/2014 0846   BASOSABS 0.0 10/20/2019 0950   BASOSABS 0.0 03/01/2014 0846    No results found for: POCLITH, LITHIUM   No results found for: PHENYTOIN, PHENOBARB, VALPROATE, CBMZ   .res Assessment: Plan:   Will discontinue trazodone since this has not been effective for her insomnia and  could potentially be causing some orthostatic hypotension.  Discussed potential benefits, risks, and side effects of Belsomra for insomnia since patient has had continued insomnia with several medications.  Patient agrees to trial of Belsomra.  Discussed that there is a free trial off transfer that can be sent to her pharmacy.  Recommended taking Belsomra 15 mg at bedtime for 10 days and then increasing to 20 mg at bedtime if 15 mg dose is ineffective.  Encourage patient to contact office to request prescription to be sent to pharmacy if Belsomra is effective and well-tolerated and to notify office staff which dose is effective for her.  Also encouraged patient to notify office if insomnia does not improve with Belsomra. Continue BuSpar 15 mg twice daily for anxiety. Continue Xanax as needed for anxiety. Continue sertraline 200 mg daily for anxiety depression. Patient follow-up in 3 months or sooner if clinically indicated. Patient advised to contact office with any questions, adverse effects, or acute worsening in signs and symptoms.   Suzanne Strong was seen today for insomnia and follow-up.  Diagnoses and all orders for this visit:  Anxiety  state -     busPIRone (BUSPAR) 15 MG tablet; Take 1 tablet (15 mg total) by mouth 2 (two) times daily.  Insomnia, unspecified type -     Suvorexant (BELSOMRA) 15 MG TABS; Take 15 mg by mouth at bedtime. -     Suvorexant (BELSOMRA) 20 MG TABS; Take 20 mg by mouth at bedtime for 10 days.    Please see After Visit Summary for patient specific instructions.  Future Appointments  Date Time Provider [place]  01/02/2020  8:30 AM [facility] PHARMACIST [facility]  05/20/2020  1:00 PM [facility] None  05/20/2020  1:30 PM Cammie Sickle, MD [facility] None  05/23/2020 11:00 AM [place], Eulas Post, MD [facility] None    No orders of the defined types were placed in this encounter.     -------------------------------

## 2019-11-06 NOTE — Telephone Encounter (Signed)
Reviewed

## 2019-11-07 ENCOUNTER — Telehealth: Payer: Self-pay | Admitting: Psychiatry

## 2019-11-07 ENCOUNTER — Encounter: Payer: Self-pay | Admitting: *Deleted

## 2019-11-07 NOTE — Telephone Encounter (Signed)
It sounds like due to their packaging they can not give that.

## 2019-11-07 NOTE — Telephone Encounter (Signed)
Tried reaching [facility], someone put me on hold then I got hung up on. Tried calling back but it wouldn't ring. Will try back again later.

## 2019-11-07 NOTE — Telephone Encounter (Signed)
Pharmacy called stating they will only fill Belsomra for 30 not just 10. Pt was thinking she was to start with 10 mg. Please advise pt of dose. And notify pharmacy [phone]

## 2019-11-08 ENCOUNTER — Other Ambulatory Visit: Payer: Self-pay

## 2019-11-08 DIAGNOSIS — G4700 Insomnia, unspecified: Secondary | ICD-10-CM

## 2019-11-08 NOTE — Telephone Encounter (Signed)
Spoke with [facility] and they can not break the packaging, it has to be #30 of 15 mg. They can't give her just a quantity for trial.   Also the patient told them that you would be starting her off with a 10 mg tablet.

## 2019-11-08 NOTE — Telephone Encounter (Signed)
Pharmacist requesting updated Rx for Belsomra 15 mg 1 q hs #30, no refills.  Pended to be sent

## 2019-11-09 MED ORDER — BELSOMRA 15 MG PO TABS: 15.0000 mg | ORAL_TABLET | Freq: Every day | ORAL | 0 refills | Status: DC

## 2019-11-13 ENCOUNTER — Telehealth: Payer: Self-pay | Admitting: Psychiatry

## 2019-11-13 ENCOUNTER — Telehealth: Payer: Self-pay

## 2019-11-13 DIAGNOSIS — G4700 Insomnia, unspecified: Secondary | ICD-10-CM

## 2019-11-13 NOTE — Telephone Encounter (Signed)
Pt lm stating the medication that was suggested is too expensive($94). Would like to discuss other options.

## 2019-11-13 NOTE — Chronic Care Management (AMB) (Signed)
Chronic Care Management Pharmacy Assistant   Name: Suzanne Strong  MRN: [id number] DOB: May 21, 1945  Reason for Encounter: Medication Review/ Follow up on Belsomra  PCP : [name], MD  Allergies:   Allergies  Allergen Reactions  . Amoxicillin Swelling    REACTION: rash, swelling Has patient had a PCN reaction causing immediate rash, facial/tongue/throat swelling, SOB or lightheadedness with hypotension: yes Has patient had a PCN reaction causing severe rash involving mucus membranes or skin necrosis: no Has patient had a PCN reaction that required hospitalization: no Has patient had a PCN reaction occurring within the last 10 years: yes If all of the above answers are "NO", then may proceed with Cephalosporin use.   [name] Fluconazole Rash    REACTION: hives  . Difuleron [Ferrous Fumarate-Dss]   . 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 albuterol (VENTOLIN HFA) 108 (90 Base) MCG/ACT inhaler Inhale 2 puffs into the lungs every 6 (six) hours as needed for wheezing or shortness of breath (tight chest).   . ALPRAZolam (XANAX) 0.5 MG tablet Take 1 tablet (0.5 mg total) by mouth 2 (two) times daily as needed for anxiety or sleep.   [name] amitriptyline (ELAVIL) 50 MG tablet Take 1 tablet (50 mg total) by mouth at bedtime.   [name] atorvastatin (LIPITOR) 10 MG tablet TAKE 1 TABLET(10 MG) BY MOUTH DAILY   . b complex vitamins tablet Take 1 tablet by mouth daily.     . busPIRone (BUSPAR) 15 MG tablet Take 1 tablet (15 mg total) by mouth 2 (two) times daily.   . Cholecalciferol (VITAMIN D-1000 MAX ST) 25 MCG (1000 UT) tablet Take 2,000 Units by mouth daily.    [name] glipiZIDE (GLUCOTROL) 10 MG tablet Take 10 mg by mouth 2 (two) times daily before a meal.    . glucose blood test strip To check blood glucose once daily and prn for diabetes   .  JANUMET XR 50-1000 MG TB24 Take 1 tablet by mouth 2 (two) times daily.    [name] lidocaine-prilocaine (EMLA) cream Apply 1 application topically as needed. Apply to port when going through Chemo   . MELOXICAM PO Take by mouth as needed.   . Multiple Vitamin (MULTIVITAMIN) tablet Take 1 tablet by mouth daily.   . Multiple Vitamins-Minerals (WOMENS MULTIVITAMIN PO) Take by mouth.   [name] omeprazole (PRILOSEC) 20 MG capsule TAKE 1 CAPSULE(20 MG) BY MOUTH AT BEDTIME   . pregabalin (LYRICA) 150 MG capsule Take 1 capsule by mouth 2 (two) times a day. 04/20/2019: Currently taking 1 in the am and 1 at night  . rOPINIRole (REQUIP) 1 MG tablet Take 3 pills by mouth daily at bedtime and 1/2 pill at lunch   . sertraline (ZOLOFT) 100 MG tablet TAKE 2 TABLETS BY MOUTH EVERY DAY   . Suvorexant (BELSOMRA) 15 MG TABS Take 15 mg by mouth at bedtime.    Facility-Administered Encounter Medications as of 11/13/2019  Medication  . sodium chloride flush (NS) 0.9 % injection 10 mL    Current Diagnosis: Patient Active Problem List   Diagnosis Date Noted  . Grief reaction 09/18/2019  . Swallowing dysfunction 07/18/2019  . Cough 02/21/2019  . Pre-syncope 02/07/2018  . Orthostatic hypotension 01/26/2018  . Episodic weakness 01/26/2018  . Medicare annual wellness visit, subsequent 06/29/2017  . 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 Preoperative examination 04/27/2017  . Carpal tunnel syndrome of right wrist 11/13/2016  . 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[facility]) History of  02/17/2016  . Obstructive sleep apnea 01/01/2016  . Carcinoma of upper-outer quadrant of left breast in female, estrogen receptor negative ([facility]) 11/21/2015  . Hypomagnesemia 08/28/2015  . Initial Medicare annual wellness visit 10/16/2014  . Estrogen deficiency 10/16/2014  . Colon cancer screening 10/16/2014  . Controlled type 2 diabetes mellitus without complication ([facility]) 07/37/1062  . RLS (restless legs syndrome) 02/21/2014  . Chronic neck pain  07/25/2010  . Depression 07/07/2010  . Routine general medical examination at a health care facility 06/26/2010  . B12 deficiency 09/04/2009  . Anemia, iron deficiency 08/29/2009  . Anxiety state 08/26/2009  . GASTROPARESIS 08/26/2009  . Vitamin D deficiency 07/19/2008  . Disorder of bone and cartilage 07/19/2007  . Hyperlipidemia 07/18/2007  . EDEMA 07/18/2007  . Skin lesion, superficial 06/02/2007    Goals Addressed   None    11/13/2019- [facility] is assisting patient with Belsomra coupon but two different strengths were sent in by the provider, one for 10 mg and the other for 15 mg. Pharmacy was able to speak with providers office and they wanted her to start on the 15 mg for 1 week than increase to 20 mg.Unfortunately the coupon card will only cover 10 tablets of either strength but not both and needing clarification from patient. Insurance price is $92.44, pharmacy has reached out to patient, no answer, voicemail left.  Called patient, no answer, left message to return call.  11/14/2019- Patient returned call, informed patient that the pharmacy is only able to use the coupon card for one strength of her Belsomra and that it only cover #10 tablets, insurance price after will be $92.44. Patient aware and would like to hold on this, she will discuss with her provider from [facility] in changing medication to a lower priced medication.   Follow-Up:  Coordination of Enhanced Pharmacy Services and Pharmacist ReviewPatient will follow up with Provider at [place] office to change medication due to cost. Donette Larry, CPP aware.  Pattricia Boss, [facility] Pharmacist Assistant [phone]

## 2019-11-14 MED ORDER — BELSOMRA 15 MG PO TABS: 15.0000 mg | ORAL_TABLET | Freq: Every day | ORAL | 0 refills | Status: DC

## 2019-11-14 MED ORDER — BELSOMRA 20 MG PO TABS: 20.0000 mg | ORAL_TABLET | Freq: Every day | ORAL | 0 refills | Status: DC

## 2019-11-14 NOTE — Telephone Encounter (Signed)
Contacted pt. She reports that she has been having difficulty obtaining Belsomra from [facility] and was told her cost for #10 tablets would be $94. Discussed that script was intended to be used with free trial voucher. Will send scripts to [facility] for #10 of 15mg  and 20 mg tabs. Pt will use voucher from website. Pt advised to call back if she has any difficulty obtaining Belsomra, if Belsomra is ineffective, or to request a script if Belsomra is helpful.

## 2019-11-14 NOTE — Telephone Encounter (Signed)
Left patient message to call me back directly at [phone]  Just want to clarify medication, perhaps Belsomra

## 2019-11-15 ENCOUNTER — Inpatient Hospital Stay: Payer: PPO

## 2019-11-15 ENCOUNTER — Other Ambulatory Visit: Payer: Self-pay

## 2019-11-15 VITALS — BP 112/70 | HR 74 | Temp 96.5°F | Resp 18

## 2019-11-15 DIAGNOSIS — M8580 Other specified disorders of bone density and structure, unspecified site: Secondary | ICD-10-CM | POA: Diagnosis not present

## 2019-11-15 DIAGNOSIS — M899 Disorder of bone, unspecified: Secondary | ICD-10-CM

## 2019-11-15 DIAGNOSIS — Z171 Estrogen receptor negative status [ER-]: Secondary | ICD-10-CM

## 2019-11-15 MED ORDER — SODIUM CHLORIDE 0.9 % IV SOLN
Freq: Once | INTRAVENOUS | Status: AC
  Filled 2019-11-15: qty 250

## 2019-11-15 MED ORDER — ZOLEDRONIC ACID 5 MG/100ML IV SOLN
5.0000 mg | Freq: Once | INTRAVENOUS | Status: AC
  Administered 2019-11-15: 5 mg via INTRAVENOUS
  Filled 2019-11-15: qty 100

## 2019-11-15 NOTE — Progress Notes (Signed)
Ok to use labs from 9/3 and Calcium =8.8 per MD for reclast today

## 2019-11-15 NOTE — Progress Notes (Signed)
Per MD- ok to use labs from 9/3 for Reclast today. Calcium 8.8 ,

## 2019-11-17 ENCOUNTER — Telehealth: Payer: Self-pay

## 2019-11-17 NOTE — Chronic Care Management (AMB) (Signed)
Chronic Care Management Pharmacy Assistant   Name: Suzanne Strong  MRN: [id number] DOB: 1945/09/09  Reason for Encounter: Medication Review/ Monthly Dispensing Call  PCP : [name], MD  Allergies:   Allergies  Allergen Reactions  . Amoxicillin Swelling    REACTION: rash, swelling Has patient had a PCN reaction causing immediate rash, facial/tongue/throat swelling, SOB or lightheadedness with hypotension: yes Has patient had a PCN reaction causing severe rash involving mucus membranes or skin necrosis: no Has patient had a PCN reaction that required hospitalization: no Has patient had a PCN reaction occurring within the last 10 years: yes If all of the above answers are "NO", then may proceed with Cephalosporin use.   [name] Fluconazole Rash    REACTION: hives  . Difuleron [Ferrous Fumarate-Dss]   . 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 albuterol (VENTOLIN HFA) 108 (90 Base) MCG/ACT inhaler Inhale 2 puffs into the lungs every 6 (six) hours as needed for wheezing or shortness of breath (tight chest).   . ALPRAZolam (XANAX) 0.5 MG tablet Take 1 tablet (0.5 mg total) by mouth 2 (two) times daily as needed for anxiety or sleep.   [name] amitriptyline (ELAVIL) 50 MG tablet Take 1 tablet (50 mg total) by mouth at bedtime.   [name] atorvastatin (LIPITOR) 10 MG tablet TAKE 1 TABLET(10 MG) BY MOUTH DAILY   . b complex vitamins tablet Take 1 tablet by mouth daily.     . busPIRone (BUSPAR) 15 MG tablet Take 1 tablet (15 mg total) by mouth 2 (two) times daily.   . Cholecalciferol (VITAMIN D-1000 MAX ST) 25 MCG (1000 UT) tablet Take 2,000 Units by mouth daily.    [name] glipiZIDE (GLUCOTROL) 10 MG tablet Take 10 mg by mouth 2 (two) times daily before a meal.    . glucose blood test strip To check blood glucose once daily and prn for diabetes     . JANUMET XR 50-1000 MG TB24 Take 1 tablet by mouth 2 (two) times daily.    [name] lidocaine-prilocaine (EMLA) cream Apply 1 application topically as needed. Apply to port when going through Chemo   . MELOXICAM PO Take by mouth as needed.   . Multiple Vitamin (MULTIVITAMIN) tablet Take 1 tablet by mouth daily.   . Multiple Vitamins-Minerals (WOMENS MULTIVITAMIN PO) Take by mouth.   [name] omeprazole (PRILOSEC) 20 MG capsule TAKE 1 CAPSULE(20 MG) BY MOUTH AT BEDTIME   . pregabalin (LYRICA) 150 MG capsule Take 1 capsule by mouth 2 (two) times a day. 04/20/2019: Currently taking 1 in the am and 1 at night  . rOPINIRole (REQUIP) 1 MG tablet Take 3 pills by mouth daily at bedtime and 1/2 pill at lunch   . sertraline (ZOLOFT) 100 MG tablet TAKE 2 TABLETS BY MOUTH EVERY DAY   . Suvorexant (BELSOMRA) 15 MG TABS Take 15 mg by mouth at bedtime.   . Suvorexant (BELSOMRA) 20 MG TABS Take 20 mg by mouth at bedtime for 10 days.    Facility-Administered Encounter Medications as of 11/17/2019  Medication  . sodium chloride flush (NS) 0.9 % injection 10 mL    Current Diagnosis: Patient Active Problem List   Diagnosis Date Noted  . Grief reaction 09/18/2019  . Swallowing dysfunction 07/18/2019  . Cough 02/21/2019  . Pre-syncope 02/07/2018  . Orthostatic  hypotension 01/26/2018  . Episodic weakness 01/26/2018  . Medicare annual wellness visit, subsequent 06/29/2017  . Neuropathy associated with cancer ([place]) 06/16/2017  . Preoperative examination 04/27/2017  . Carpal tunnel syndrome of right wrist 11/13/2016  . 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[place]) History of  02/17/2016  . Obstructive sleep apnea 01/01/2016  . Carcinoma of upper-outer quadrant of left breast in female, estrogen receptor negative ([place]) 11/21/2015  . Hypomagnesemia 08/28/2015  . Initial Medicare annual wellness visit 10/16/2014  . Estrogen deficiency 10/16/2014  . Colon cancer screening 10/16/2014  . Controlled type 2 diabetes mellitus without complication  ([place]) 32/35/5732  . RLS (restless legs syndrome) 02/21/2014  . Chronic neck pain 07/25/2010  . Depression 07/07/2010  . Routine general medical examination at a health care facility 06/26/2010  . B12 deficiency 09/04/2009  . Anemia, iron deficiency 08/29/2009  . Anxiety state 08/26/2009  . GASTROPARESIS 08/26/2009  . Vitamin D deficiency 07/19/2008  . Disorder of bone and cartilage 07/19/2007  . Hyperlipidemia 07/18/2007  . EDEMA 07/18/2007  . Skin lesion, superficial 06/02/2007    Goals Addressed   None    Reviewed chart for medication changes ahead of medication coordination call.  OVs, Consults- 10/20/2019 Dr Rogue Bussing (Oncology), 11/01/2019- Thayer Headings, PMHNP [facility])  since last care coordination call/Pharmacist visit.  Medication changes indicated: Belsomra 15 mg and 20 mg was prescribed on 11/14/2019 by Thayer Headings, PMHNP.   BP Readings from Last 3 Encounters:  11/15/19 112/70  10/20/19 131/78  09/18/19 (!) 142/84    Lab Results  Component Value Date   HGBA1C 8.0 (H) 06/29/2017     Patient obtains medications through Adherence Packaging  90 Days   Last adherence delivery included:   Amitriptyline 50 mg -1 tablet daily (bedtime), Atorvastatin 10 mg -1 tablet daily (breakfast), Buspirone 15 mg - 1 tablet twice daily (Breakfast, bedtime), Glipizide 10 mg 1 tablet twice daily - (breakfast,  evening meal),  Janumet XR 50/1000 mg 1 tablet twice daily (breakfast, evening meal), Lidocaine- Prilocaine EMLA cream-PRN, Omeprazole 20 mg 1 tablet daily (bedtime), Pregabalin 150 mg 1 tablet twice daily ( breakfast,  bedtime), Ropinirole 1 mg - tablet in the morning and 3 tablets nightly ( breakfast, bedtime), Sertraline 100 mg -2 tablets daily ( breakfast), Trazodone 100 mg 1 tablet nightly ( bedtime), Alpha lipoic acid 200 mg -1 tablet nightly (bedtime), One touch ultra test strips to check blood sugars daily and as needed, Xanax 0.5 mg - 1 tablet two times a day  PRN.  Patient declined the following medications last month: Albuterol inhaler- 2 puffs every 6 hours as needed due to having an adequate supply on hand, Meloxicam uses as needed and has an adequate supply and Vitamin B Complex- patient is no longer using.       Patient is due for next adherence delivery on: 11/20/2019. 11/17/19- Patient called @ 9:19AM by Wendy Poet, [place].TM 11/17/19- Called patient @ 12:20PM [email or web address].TM 11/21/19- Called patient x 2- LMTRC. TM Patient returned call 11/21/19. TM  Spoke with patient and reviewed medications and coordinated delivery.  This delivery to include:   Amitriptyline 50 mg -1 tablet daily (bedtime), Atorvastatin 10 mg -1 tablet daily (breakfast), Buspirone 15 mg - 1 tablet twice daily (Breakfast, bedtime), Glipizide 10 mg 1 tablet twice daily - (breakfast,  evening meal),  Janumet XR 50/1000 mg 1 tablet twice daily (breakfast, evening meal),Omeprazole 20 mg 1 tablet daily (bedtime), Pregabalin 150 mg 1 tablet twice daily ( breakfast,  bedtime), Ropinirole 1 mg -  tablet in the morning and 3 tablets nightly ( breakfast, bedtime), Sertraline 100 mg -2 tablets daily ( breakfast), Trazodone 100 mg 1 tablet nightly ( bedtime), Alpha lipoic acid 200 mg -1 tablet nightly (bedtime),  Xanax 0.5 mg - 1 tablet two times a day PRN.  Patient declined the following medications:  One touch ultra test strips to check blood sugars daily and as needed due to receiving a 50 day supply on 10/23/2019. Patient states she has 1 full bottle left. Albuterol inhaler- 2 puffs every 6 hours as needed due to having an adequate supply on hand, Meloxicam uses as needed and has an adequate supply on hand, Vitamin D3 and Multivitamins- patient gets these OTC.   Patient needs refills for: Buspirone 15 mg, Amitriptyline 50 mg and Ropinirole 1 mg sent to [facility]..  Confirmed delivery date of 11/22/2019, due to delayed coordination call, advised patient that pharmacy will contact  them the morning of delivery. Patient understands and ok with delivery date, she is not out of any medications.  Follow-Up:  Coordination of Enhanced Pharmacy Services and Pharmacist Review- Patient mentioned she started an extra Calcium vitamin due to recent Bone Density levels, she gets this over the counter.  Donette Larry, CPP notified.  Pattricia Boss, [place] Pharmacist Assistant [phone]

## 2019-11-29 ENCOUNTER — Telehealth: Payer: Self-pay | Admitting: Psychiatry

## 2019-11-29 DIAGNOSIS — G4700 Insomnia, unspecified: Secondary | ICD-10-CM

## 2019-11-29 MED ORDER — BELSOMRA 20 MG PO TABS: 20.0000 mg | ORAL_TABLET | Freq: Every day | ORAL | 2 refills | Status: DC

## 2019-11-29 NOTE — Telephone Encounter (Signed)
Pt called and left a message stating that the belsomra 20 mg worked better and she would like a script to be sent into the [facility] on [address] in [place]

## 2019-11-29 NOTE — Telephone Encounter (Signed)
Script sent  

## 2019-11-29 NOTE — Telephone Encounter (Signed)
Please review

## 2019-12-01 ENCOUNTER — Telehealth: Payer: Self-pay | Admitting: Psychiatry

## 2019-12-01 NOTE — Telephone Encounter (Signed)
Pt lm requesting that her Belsomra Rx be filled at the [facility] on [address] instead of the one previously stated.

## 2019-12-04 ENCOUNTER — Other Ambulatory Visit: Payer: Self-pay

## 2019-12-04 ENCOUNTER — Telehealth: Payer: Self-pay | Admitting: Psychiatry

## 2019-12-04 ENCOUNTER — Telehealth: Payer: Self-pay | Admitting: *Deleted

## 2019-12-04 DIAGNOSIS — G4700 Insomnia, unspecified: Secondary | ICD-10-CM

## 2019-12-04 MED ORDER — BELSOMRA 20 MG PO TABS: 20.0000 mg | ORAL_TABLET | Freq: Every day | ORAL | 2 refills | Status: DC

## 2019-12-04 NOTE — Telephone Encounter (Signed)
Patient scheduled appointment on 12/06/19-3:30.

## 2019-12-04 NOTE — Telephone Encounter (Signed)
Pt called asking for Belsomra 20 mg generic Rx to go to [facility]. Pt ask to advise when Rx sent

## 2019-12-04 NOTE — Telephone Encounter (Signed)
Rx for Belsomra 20 mg 1 at hs called into [facility] [address], 2 refills

## 2019-12-04 NOTE — Telephone Encounter (Signed)
Please schedule a virtual visit wen able

## 2019-12-04 NOTE — Telephone Encounter (Signed)
Rx was called in this morning, needs to check with [facility].

## 2019-12-04 NOTE — Telephone Encounter (Signed)
Patient states she has been having a low grade fever in the afternoons, relieved with Tylenol.  No other symptoms except a little fatigue.  This doesn't even occur every day, maybe 2 days a week.   Covid test was negative 2 weeks ago,.  This does not prevent her from doing anything in her routine. Patient asks if she should be concerned or just give it more time?

## 2019-12-06 ENCOUNTER — Encounter: Payer: Self-pay | Admitting: Family Medicine

## 2019-12-06 ENCOUNTER — Telehealth (INDEPENDENT_AMBULATORY_CARE_PROVIDER_SITE_OTHER): Payer: PPO | Admitting: Family Medicine

## 2019-12-06 ENCOUNTER — Other Ambulatory Visit: Payer: Self-pay | Admitting: Family Medicine

## 2019-12-06 DIAGNOSIS — R509 Fever, unspecified: Secondary | ICD-10-CM

## 2019-12-06 DIAGNOSIS — R059 Cough, unspecified: Secondary | ICD-10-CM | POA: Diagnosis not present

## 2019-12-06 MED ORDER — HYDROCOD POLST-CPM POLST ER 10-8 MG/5ML PO SUER
5.0000 mL | Freq: Two times a day (BID) | ORAL | 0 refills | Status: DC | PRN
Start: 2019-12-06 — End: 2019-12-22

## 2019-12-06 NOTE — Progress Notes (Signed)
Virtual Visit via Telephone Note  I connected with Suzanne Strong on 12/06/19 at  3:30 PM EDT by telephone and verified that I am speaking with the correct person using two identifiers.  Location: Patient: home Provider: office   I discussed the limitations, risks, security and privacy concerns of performing an evaluation and management service by telephone and the availability of in person appointments. I also discussed with the patient that there may be a patient responsible charge related to this service. The patient expressed understanding and agreed to proceed.  Parties involved in encounter  Patient: Suzanne Strong  Provider:  [name] MD    History of Present Illness: Pt presents for low grade fever and fatigue  She has a h/o tonsillar cancer in the past  Had pna in June-consistent with aspiration   Going on for a while - well over a month  Low grade temp in the afternoons relieved with tylenol  Perhaps 1-2 days per week -more often after she exerts herself  Checks her temp after sitting down (feels achey) Highest it has been is 101  Tylenol takes it away   Still has a persistent cough since the pneumonia  Worse at night  A little wheezing  Uses her albuterol inhaler as needed - that helps a bit   Nose occ runs  Not congested No headache unless she coughs very hard  No sinus pain or pressure   No change in taste/smell overall (she lost some of it from cancer treatment in the past)   Some loose stool    covid test was neg 2 weeks ago   She had both covid vaccines and the booster  The booster gave her side effects that afternoon/pm   No rash  No tick bites that she knows of  occ finds a tick on her dog   She struggles with throat /swallowing (had speech path eval)  Has checked in with Dr Suzanne Strong- she had scope and all was ok    She has never had flu shot-onc did not want her to   otc Tylenol  mvi  No cough medicine   Patient Active Problem  List   Diagnosis Date Noted  . Grief reaction 09/18/2019  . Swallowing dysfunction 07/18/2019  . Cough 02/21/2019  . Fever, intermittent 09/01/2018  . Pre-syncope 02/07/2018  . Orthostatic hypotension 01/26/2018  . Episodic weakness 01/26/2018  . Medicare annual wellness visit, subsequent 06/29/2017  . 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 Preoperative examination 04/27/2017  . Carpal tunnel syndrome of right wrist 11/13/2016  . 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[place]) History of  02/17/2016  . Obstructive sleep apnea 01/01/2016  . Carcinoma of upper-outer quadrant of left breast in female, estrogen receptor negative ([place]) 11/21/2015  . Hypomagnesemia 08/28/2015  . Initial Medicare annual wellness visit 10/16/2014  . Estrogen deficiency 10/16/2014  . Colon cancer screening 10/16/2014  . Controlled type 2 diabetes mellitus without complication ([place]) 41/74/0814  . RLS (restless legs syndrome) 02/21/2014  . Chronic neck pain 07/25/2010  . Depression 07/07/2010  . Routine general medical examination at a health care facility 06/26/2010  . B12 deficiency 09/04/2009  . Anemia, iron deficiency 08/29/2009  . Anxiety state 08/26/2009  . GASTROPARESIS 08/26/2009  . Vitamin D deficiency 07/19/2008  . Disorder of bone and cartilage 07/19/2007  . Hyperlipidemia 07/18/2007  . EDEMA 07/18/2007  . Skin lesion, superficial 06/02/2007   Past Medical History:  Diagnosis Date  . Anemia    iron deficiency and  B12 deficiency  . Anxiety   . Breast cancer ([place]) 02/25/2015   upper-outer quadrant of left female breast, Triple Negative. Neo-adjuvant chemo with complete pathologic response.   . Cervical dysplasia    conization  . Diabetes mellitus without complication ([place])   . Diverticulosis   . Fever 04/11/2015  . Gastroparesis    related to previous radiation tx  . GERD (gastroesophageal reflux disease)   . Hyperlipidemia   . Hypertension   . Neuropathy    legs/feet, S/P chemo meds  .  Personal history of chemotherapy   . Personal history of radiation therapy 2017   LEFT lumpectomy w/ radiation  . RLS (restless legs syndrome)   . Shingles    Hx of  . 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[place]) 2006  . Wears dentures    partial bottom   Past Surgical History:  Procedure Laterality Date  . APPENDECTOMY    . BREAST BIOPSY Left 02/25/2015   INVASIVE MAMMARY CARCINOMA,Triple negative.  [name] BREAST CYST ASPIRATION    . BREAST EXCISIONAL BIOPSY Left 12/2015   surgery  . BREAST LUMPECTOMY WITH NEEDLE LOCALIZATION Left 10/02/2015   Procedure: BREAST LUMPECTOMY WITH NEEDLE LOCALIZATION;  Surgeon: Suzanne Bellow, MD;  Location: [facility];  Service: General;  Laterality: Left;  . BREAST LUMPECTOMY WITH SENTINEL LYMPH NODE BIOPSY Left 10/02/2015   Procedure: LEFT BREAST WIDE EXCISION WITH SENTINEL LYMPH NODE BX;  Surgeon: Suzanne Bellow, MD;  Location: [facility];  Service: General;  Laterality: Left;  . BREAST SURGERY     breast biopsy benign  . CARPAL TUNNEL RELEASE Right 05/19/2017   Procedure: CARPAL TUNNEL RELEASE;  Surgeon: Earnestine Leys, MD;  Location: [facility];  Service: Orthopedics;  Laterality: Right;  . CATARACT EXTRACTION W/PHACO Right 10/26/2018   Procedure: CATARACT EXTRACTION PHACO AND INTRAOCULAR LENS PLACEMENT ([place]) right diabetic;  Surgeon: Suzanne Koyanagi, MD;  Location: [facility];  Service: Ophthalmology;  Laterality: Right;  Diabetic - oral meds cancer center been notified and will come  . CATARACT EXTRACTION W/PHACO Left 11/16/2018   Procedure: CATARACT EXTRACTION PHACO AND INTRAOCULAR LENS PLACEMENT ([facility]) LEFT DIABETIC  01:01.0  17.0%  10.37;  Surgeon: Suzanne Koyanagi, MD;  Location: [facility];  Service: Ophthalmology;  Laterality: Left;  Diabetic - oral meds Port-a-cath  . CHOLECYSTECTOMY    . Cologuard  07/22/2016   Negative  . ESOPHAGOGASTRODUODENOSCOPY  07/2009   normal with few gastric polyps  . MOLE REMOVAL    . PORT-A-CATH REMOVAL    .  PORTACATH PLACEMENT    . PORTACATH PLACEMENT Right 03/12/2015   Procedure: INSERTION PORT-A-CATH;  Surgeon: Suzanne Bellow, MD;  Location: [facility];  Service: General;  Laterality: Right;  . RADICAL NECK DISSECTION    . TONSILLECTOMY     cancer treated with chemo and radiation  . TRIGGER FINGER RELEASE Right 05/19/2017   Procedure: RELEASE TRIGGER FINGER/A-1 PULLEY ring finger;  Surgeon: Earnestine Leys, MD;  Location: [facility];  Service: Orthopedics;  Laterality: Right;   Social History   Tobacco Use  . Smoking status: Former Smoker    Packs/day: 0.50    Years: 6.00    Pack years: 3.00    Types: Cigarettes    Quit date: 03/10/1984    Years since quitting: 35.7  . Smokeless tobacco: Never Used  Vaping Use  . Vaping Use: Never used  Substance Use Topics  . Alcohol use: No    Alcohol/week: 0.0 standard drinks  . Drug use: No   Family  History  Problem Relation Age of Onset  . Osteoporosis Mother   . Hyperlipidemia Mother   . Depression Mother   . Cancer Mother        skin cancer ? basal cell and lung ca heavy smoker  . Alcohol abuse Father   . Cancer Father        skin CA ? basal cell  . Heart disease Father        CHF  . Hyperlipidemia Brother   . Hypertension Brother   . Breast cancer Neg Hx    Allergies  Allergen Reactions  . Amoxicillin Swelling    REACTION: rash, swelling Has patient had a PCN reaction causing immediate rash, facial/tongue/throat swelling, SOB or lightheadedness with hypotension: yes Has patient had a PCN reaction causing severe rash involving mucus membranes or skin necrosis: no Has patient had a PCN reaction that required hospitalization: no Has patient had a PCN reaction occurring within the last 10 years: yes If all of the above answers are "NO", then may proceed with Cephalosporin use.   [name] Fluconazole Rash    REACTION: hives  . Difuleron [Ferrous Fumarate-Dss]   . 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 albuterol (VENTOLIN HFA) 108 (90 Base) MCG/ACT inhaler Inhale 2 puffs into the lungs every 6 (six) hours as needed for wheezing or shortness of breath (tight chest). 18 g 3  . ALPRAZolam (XANAX) 0.5 MG tablet Take 1 tablet (0.5 mg total) by mouth 2 (two) times daily as needed for anxiety or sleep. 60 tablet 5  . amitriptyline (ELAVIL) 50 MG tablet Take 1 tablet (50 mg total) by mouth at bedtime. 90 tablet 3  . atorvastatin (LIPITOR) 10 MG tablet TAKE 1 TABLET(10 MG) BY MOUTH DAILY 90 tablet 3  . b complex vitamins tablet Take 1 tablet by mouth daily.      . busPIRone (BUSPAR) 15 MG tablet Take 1 tablet (15 mg total) by mouth 2 (two) times daily. 180 tablet 0  . Cholecalciferol (VITAMIN D-1000 MAX ST) 25 MCG (1000 UT) tablet Take 2,000 Units by mouth daily.     [name] glipiZIDE (GLUCOTROL) 10 MG tablet Take 10 mg by mouth 2 (two) times daily before a meal.     . glucose blood test strip To check blood glucose once daily and prn for diabetes 100 each 3  . JANUMET XR 50-1000 MG TB24 Take 1 tablet by mouth 2 (two) times daily.   0  . lidocaine-prilocaine (EMLA) cream Apply 1 application topically as needed. Apply to port when going through Chemo    . MELOXICAM PO Take by mouth as needed.    . Multiple Vitamin (MULTIVITAMIN) tablet Take 1 tablet by mouth daily.    . Multiple Vitamins-Minerals (WOMENS MULTIVITAMIN PO) Take by mouth.    [name] omeprazole (PRILOSEC) 20 MG capsule TAKE 1 CAPSULE(20 MG) BY MOUTH AT BEDTIME 90 capsule 3  . rOPINIRole (REQUIP) 1 MG tablet Take 3 pills by mouth daily at bedtime and 1/2 pill at lunch 315 tablet 0  . sertraline (ZOLOFT) 100 MG tablet TAKE 2 TABLETS BY MOUTH EVERY DAY 180 tablet 1  . Suvorexant (BELSOMRA) 20 MG TABS Take 20 mg by mouth at bedtime. 30 tablet 2  . pregabalin (LYRICA) 150 MG capsule Take 1 capsule by mouth 2 (two) times  a day.      Current Facility-Administered Medications on File Prior to Visit  Medication Dose Route Frequency Provider Last Rate Last Admin  . sodium chloride flush (NS) 0.9 % injection 10 mL  10 mL Intravenous PRN Cammie Sickle, MD   10 mL at 09/11/15 0845    Review of Systems  Constitutional: Positive for fever. Negative for chills and malaise/fatigue.  HENT: Negative for congestion, ear pain, sinus pain and sore throat.   Eyes: Negative for blurred vision, discharge and redness.  Respiratory: Positive for cough. Negative for shortness of breath, wheezing and stridor.   Cardiovascular: Negative for chest pain, palpitations and leg swelling.  Gastrointestinal: Negative for abdominal pain, diarrhea, nausea and vomiting.       Loose stool  Musculoskeletal: Negative for myalgias.  Skin: Negative for rash.  Neurological: Negative for dizziness and headaches.      Observations/Objective: Pt sounds mildly hoarse (this is her baseline)  Not distressed Few dry coughs/raspy  No audible sob or wheeze  Good historian  Mood is good as well  Assessment and Plan: Problem List Items Addressed This Visit      Other   Fever, intermittent    Ongoing cough and intermittent fever in pt who had pneumonia in the summer (prone to aspiration)  covid test neg 2 wk ago  Will repeat this with a flu swab cxr planned       Cough - Primary    Ongoing cough/over a month with occ low grade temp in pt who had pna in June (prone to aspiration)  Neg covid test 2 wk ago -will repeat this and also do flu swab cxr ordered  Still struggled with swallowing (past h/o treated tonsillar cancer with scar tissue)  tussionex sent in for cough relief with caution of sedation and habit          Follow Up Instructions: The office will call you to schedule drive through tests (covid and flu) , I also ordered a chest xray at [facility]  Try tussionex for cough with caution of sedation  Drink fluids and get some rest      I discussed the assessment and treatment plan with the patient. The patient was provided an opportunity to ask questions and all were answered. The patient agreed with the plan and demonstrated an understanding of the instructions.   The patient was advised to call back or seek an in-person evaluation if the symptoms worsen or if the condition fails to improve as anticipated.  I provided 18 minutes of non-face-to-face time during this encounter.   [name], MD

## 2019-12-06 NOTE — Patient Instructions (Signed)
The office will call you to schedule drive through tests (covid and flu) , I also ordered a chest xray at [facility]  Try tussionex for cough with caution of sedation  Drink fluids and get some rest

## 2019-12-06 NOTE — Assessment & Plan Note (Signed)
Ongoing cough and intermittent fever in pt who had pneumonia in the summer (prone to aspiration)  covid test neg 2 wk ago  Will repeat this with a flu swab cxr planned

## 2019-12-06 NOTE — Assessment & Plan Note (Signed)
Ongoing cough/over a month with occ low grade temp in pt who had pna in June (prone to aspiration)  Neg covid test 2 wk ago -will repeat this and also do flu swab cxr ordered  Still struggled with swallowing (past h/o treated tonsillar cancer with scar tissue)  tussionex sent in for cough relief with caution of sedation and habit

## 2019-12-07 ENCOUNTER — Telehealth: Payer: Self-pay | Admitting: *Deleted

## 2019-12-07 ENCOUNTER — Other Ambulatory Visit (INDEPENDENT_AMBULATORY_CARE_PROVIDER_SITE_OTHER): Payer: PPO

## 2019-12-07 ENCOUNTER — Other Ambulatory Visit: Payer: Self-pay

## 2019-12-07 DIAGNOSIS — R059 Cough, unspecified: Secondary | ICD-10-CM

## 2019-12-07 DIAGNOSIS — R509 Fever, unspecified: Secondary | ICD-10-CM

## 2019-12-07 LAB — POC INFLUENZA A&B (BINAX/QUICKVUE)
Influenza A, POC: NEGATIVE
Influenza B, POC: NEGATIVE

## 2019-12-07 NOTE — Telephone Encounter (Signed)
Pt had a virtual appt with PCP yesterday afternoon, DR. Tower advised me after their appt that pt needs chest xray at [facility] and labs here for flu and covid. Called pt yesterday and no answer (kept ringing).  Called pt again now and still no answer (kept ringing), called pt's cell # and pt has a VM that hasn't been set up.  **if pt calls back please given instructions on how to get xray [facility] entrance of [facility]), and schedule a lab appt here for TEST- COVID/ FLU

## 2019-12-08 LAB — SPECIMEN STATUS REPORT

## 2019-12-08 LAB — NOVEL CORONAVIRUS, NAA: SARS-CoV-2, NAA: NOT DETECTED

## 2019-12-08 LAB — SARS-COV-2, NAA 2 DAY TAT

## 2019-12-08 NOTE — Telephone Encounter (Signed)
Pt did call back and scheduled appts

## 2019-12-11 ENCOUNTER — Ambulatory Visit
Admission: RE | Admit: 2019-12-11 | Discharge: 2019-12-11 | Disposition: A | Discharge status: Home / Self Care | Payer: PPO | Attending: Family Medicine | Admitting: Family Medicine

## 2019-12-11 ENCOUNTER — Ambulatory Visit
Admission: RE | Admit: 2019-12-11 | Discharge: 2019-12-11 | Disposition: A | Discharge status: Home / Self Care | Payer: PPO | Source: Ambulatory Visit | Attending: Family Medicine | Admitting: Family Medicine

## 2019-12-11 DIAGNOSIS — R509 Fever, unspecified: Secondary | ICD-10-CM | POA: Diagnosis not present

## 2019-12-11 DIAGNOSIS — R059 Cough, unspecified: Secondary | ICD-10-CM

## 2019-12-11 DIAGNOSIS — J811 Chronic pulmonary edema: Secondary | ICD-10-CM | POA: Diagnosis not present

## 2019-12-11 DIAGNOSIS — J9811 Atelectasis: Secondary | ICD-10-CM | POA: Diagnosis not present

## 2019-12-11 DIAGNOSIS — J189 Pneumonia, unspecified organism: Secondary | ICD-10-CM | POA: Diagnosis not present

## 2019-12-13 ENCOUNTER — Other Ambulatory Visit: Payer: Self-pay | Admitting: Family Medicine

## 2019-12-13 NOTE — Telephone Encounter (Signed)
On cxr there is a streaky opacity in L lung base  This could be some scarring or focal air loss (not unusual) but cannot rule out small pneumonia   I pended px for azithromycin to send to her pharmacy of choice  We will want to repeat this cxr in about 3-4 weeks  F/u with me then in office please

## 2019-12-14 MED ORDER — AZITHROMYCIN 250 MG PO TABS: ORAL_TABLET | ORAL | 0 refills | Status: DC

## 2019-12-14 NOTE — Telephone Encounter (Signed)
Pt notified of xray results and Dr. Marliss Coots comments Rx sent to pharmacy and f/u appt scheduled

## 2019-12-14 NOTE — Telephone Encounter (Signed)
Left VM requesting pt to call the office back 

## 2019-12-19 ENCOUNTER — Telehealth: Payer: Self-pay

## 2019-12-19 ENCOUNTER — Telehealth: Payer: Self-pay | Admitting: Psychiatry

## 2019-12-19 NOTE — Telephone Encounter (Signed)
This was filled on 12/04/2019 at [facility]. Will need to clarify if changing pharmacies?

## 2019-12-19 NOTE — Telephone Encounter (Signed)
Pharmacy called and said that they need a script of the belsomra 20 [facility]

## 2019-12-19 NOTE — Chronic Care Management (AMB) (Addendum)
Chronic Care Management Pharmacy Assistant   Name: JEROLYN FLENNIKEN  MRN: [id number] DOB: 10/29/1945  Reason for Encounter: Medication Review/ Monthly Dispensing Call  Patient Questions:  1.  Have you seen any other providers since your last visit? Yes, 12/06/2019- Dr. Glori Bickers (PCP)  2.  Any changes in your medicines or health? Yes, Hydrocod Polst-Chlorphen 10-8 mg/5 ml started 12/06/2019, Amitriptyline 50 mg instruction updated to 1 tablet nightly as needed for sleep on 12/12/19. Azithromycin 250 mg- 2 tablets by mouth today and 1 pill for 4 days started on 12/13/2019.  PCP : [name], MD  Allergies:   Allergies  Allergen Reactions   Amoxicillin Swelling    REACTION: rash, swelling Has patient had a PCN reaction causing immediate rash, facial/tongue/throat swelling, SOB or lightheadedness with hypotension: yes Has patient had a PCN reaction causing severe rash involving mucus membranes or skin necrosis: no Has patient had a PCN reaction that required hospitalization: no Has patient had a PCN reaction occurring within the last 10 years: yes If all of the above answers are "NO", then may proceed with Cephalosporin use.    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    ALPRAZolam (XANAX) 0.5 MG tablet Take 1 tablet (0.5 mg total) by mouth 2 (two) times daily as needed for anxiety or sleep.    amitriptyline (ELAVIL) 50 MG tablet Take 1 tablet (50 mg total) by mouth at bedtime.    atorvastatin (LIPITOR) 10 MG tablet TAKE 1 TABLET(10 MG) BY MOUTH DAILY    azithromycin (ZITHROMAX Z-PAK) 250  MG tablet Take 2 pills by mouth today and then 1 pill daily for 4 days    b complex vitamins tablet Take 1 tablet by mouth daily.      busPIRone (BUSPAR) 15 MG tablet Take 1 tablet (15 mg total) by mouth 2 (two) times daily.    chlorpheniramine-HYDROcodone (TUSSIONEX PENNKINETIC ER) 10-8 MG/5ML SUER Take 5 mLs by mouth every 12 (twelve) hours as needed for cough. Caution of sedation    Cholecalciferol (VITAMIN D-1000 MAX ST) 25 MCG (1000 UT) tablet Take 2,000 Units by mouth daily.     glipiZIDE (GLUCOTROL) 10 MG tablet Take 10 mg by mouth 2 (two) times daily before a meal.     glucose blood test strip To check blood glucose once daily and prn for diabetes    JANUMET XR 50-1000 MG TB24 Take 1 tablet by mouth 2 (two) times daily.     lidocaine-prilocaine (EMLA) cream Apply 1 application topically as needed. Apply to port when going through Chemo    MELOXICAM PO Take by mouth as needed.    Multiple Vitamin (MULTIVITAMIN) tablet Take 1 tablet by mouth daily.    Multiple Vitamins-Minerals (WOMENS MULTIVITAMIN PO) Take by mouth.    omeprazole (PRILOSEC) 20 MG capsule TAKE 1 CAPSULE(20 MG) BY MOUTH AT BEDTIME    pregabalin (LYRICA) 150 MG capsule Take 1 capsule by mouth 2 (two) times a day. 04/20/2019: Currently taking 1 in the am and 1 at night   rOPINIRole (REQUIP) 1 MG  tablet Take 3 pills by mouth daily at bedtime and 1/2 pill at lunch    sertraline (ZOLOFT) 100 MG tablet TAKE 2 TABLETS BY MOUTH EVERY DAY    Suvorexant (BELSOMRA) 20 MG TABS Take 20 mg by mouth at bedtime.    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   Preoperative examination 04/27/2017   Carpal tunnel syndrome of right wrist 11/13/2016   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  02/17/2016   Obstructive sleep apnea 01/01/2016   Carcinoma of upper-outer quadrant of left breast in female, estrogen receptor negative ([place]) 11/21/2015   Hypomagnesemia 08/28/2015   Initial Medicare annual wellness visit 10/16/2014   Estrogen deficiency 10/16/2014   Colon cancer screening 10/16/2014   Controlled type 2 diabetes mellitus without complication ([place]) 88/41/6606   RLS (restless legs syndrome) 02/21/2014   Chronic neck pain 07/25/2010   Depression 07/07/2010   Routine general medical examination at a health care facility 06/26/2010   B12 deficiency 09/04/2009   Anemia, iron deficiency 08/29/2009   Anxiety state 08/26/2009   GASTROPARESIS 08/26/2009   Vitamin D deficiency 07/19/2008   Disorder of bone and cartilage 07/19/2007   Hyperlipidemia 07/18/2007   EDEMA 07/18/2007   Skin lesion, superficial 06/02/2007   Reviewed chart for medication changes ahead of medication coordination call.  OVs- 12/06/2019- Dr. Glori Bickers (PCP) since last care coordination call/Pharmacist visit.  Medication changes indicated: Hydrocod Polst-Chlorphen 10-8 mg/5 ml started 12/06/2019, Amitriptyline 50 mg instruction updated to 1 tablet nightly as needed for sleep on 12/12/19. Azithromycin 250 mg- 2 tablets by mouth today and 1 pill for 4 days started on 12/13/2019.   Checking BG daily - reports fasting readings of 106, 83, 110 Checking blood pressure daily - 100/63, 110/73  BP Readings from Last 3 Encounters:  11/15/19 112/70  10/20/19 131/78  09/18/19 (!) 142/84    Lab Results  Component Value Date   HGBA1C 8.0 (H) 06/29/2017     Patient obtains medications through Adherence Packaging  30 Days   Last adherence delivery included:  Omeprazole 20 mg - 1 tablet daily  (bedtime) Amitriptyline 50 mg - 1 tablet daily (bedtime) Ropinirole 1 mg -  tablet once daily (breakfast) and 3 tablets once nightly ( bedtime) Pregabalin 150 mg - 1 twice daily (breakfast and bedtime) Alpha lipoic acid 200 mg - 1 tablet daily ( bedtime) Janumet XR 50/1000 mg - 1 tablet two times a day (breakfast and evening meal), Sertraline 100 mg - 2 tablet daily (breakfast) Trazodone 100 mg - 1 tablet daily ( bedtime) Xanax 0.5 mg - 1 tablet daily as needed Buspirone 15 mg- 1 tablet twice daily ( breakfast and bedtime)  Glipizide 10 mg- 1 tablet twice daily (breakfast and evening meal) Atorvastatin 10 mg- 1 tablet daily (breakfast)  Patient declined: Vitamin B-Complex - 1 tablet daily ( breakfast), Albuterol hfa- 2 puffs by mouth every 6 hours as needed last month due to PRN use and Centrum Silver Multivitamin - 1 tablet daily (breakfast),Vitamin D3 50 mcg - 1 tablet twice daily (breakfast and bedtime) due to  having an adequate supply on hand, will add to packs once finished home supply.   Patient is due for next adherence delivery on: 12/22/2019. Called patient and reviewed medications and coordinated delivery.  This delivery to include: Omeprazole 20 mg - 1 tablet daily (bedtime) Amitriptyline 50 mg - 1 tablet daily (bedtime) Ropinirole 1 mg -  tablet once daily (breakfast) and 3 tablets once nightly ( bedtime) Pregabalin 150 mg - 1 twice daily (breakfast and bedtime) Alpha lipoic acid 200 mg - 1 tablet daily ( bedtime) Janumet XR 50/1000 mg - 1 tablet two times a day (breakfast and evening meal), Sertraline 100 mg - 2 tablet daily (breakfast) Trazodone 100 mg - 1 tablet daily ( bedtime) Xanax 0.5 mg - 1 tablet daily as needed Buspirone 15 mg- 1 tablet twice daily ( breakfast and bedtime)  Atorvastatin 10 mg- 1 tablet daily (breakfast) Albuterol hfa- 2 puffs by mouth every 6 hours as needed  Short fill not needed.  Coordinated acute fill for Belsomra 20mg - 1 tablet daily at  bedtime to be delivered 01/01/2020.  Patient declined the following medications: Glipizide 10 mg- 1 tablet twice daily (breakfast and evening meal) due to having extra supply from [id number] on hand in one bottle and another bottle that has not been opened, patient is using Glipizide out of packaging system but would like to hold for a few months until she finishes medications from [place].  Centrum Silver Multivitamin - 1 tablet daily (breakfast), Vitamin B-Complex - 1 tablet daily ( breakfast) and Vitamin D3 50 mcg - 1 tablet twice daily (breakfast and bedtime) due to having an adequate supply on hand, will add to packs once finished home supply.    Patient needs refills for Belsomra 20 mg and Ropinirole 1 mg.  Confirmed delivery date of 12/22/2019, advised patient that pharmacy will contact them the morning of delivery.  Follow-Up:  Coordination of Enhanced Pharmacy Services and Pharmacist Review  Patient still using samples of Belsomra, has 14 days left, acute form filled out, requesting refill of [place] to be [facility].  Patient would like to hold Glipizide from packages for a few months, has 2 bottles from [place] she would like to use first. [name], CPP notified.  Called [facility], left message for nurse to send a new prescription for Belsomra 20 mg to [facility].   Pattricia Boss, [place] Pharmacist Assistant [phone]

## 2019-12-20 ENCOUNTER — Telehealth: Payer: Self-pay

## 2019-12-20 ENCOUNTER — Other Ambulatory Visit: Payer: Self-pay | Admitting: Family Medicine

## 2019-12-20 NOTE — Chronic Care Management (AMB) (Signed)
Requesting refills on the following medications be sent to [facility] pharmacy: ropinirole   Thanks!  [name], PharmD Clinical Pharmacist [facility] at [facility] [phone]

## 2019-12-20 NOTE — Telephone Encounter (Signed)
The pharmacy tech I spoke with was not sure who contacted the office about this Rx. I explained she did get Belsomra with [facility] on 12/04/2019. Informed her if patient does want to switch this medication as well as her others that would not be an issue just needed to know if that's what she is wanting to do. They will reach out to patient to confirm and call back if needing a new Rx.

## 2019-12-21 ENCOUNTER — Telehealth: Payer: Self-pay

## 2019-12-21 NOTE — Chronic Care Management (AMB) (Addendum)
Chronic Care Management Pharmacy Assistant   Name: Suzanne Strong  MRN: [id number] DOB: 12/20/1945  Reason for Encounter: Medication Review/ Refill Request/ Patient Assistance Coordination  PCP : [name], MD   12/20/2019- Called Dr Gabriel Carina office to request refill of Pregabalin 150 mg to be sent to [facility]. Spoke with receptionist, message was sent to nurse for refill request.   12/21/2019- [facility] received a denial on refill request for Lyrica. Called Dr Gabriel Carina office again, left message for nurse Janett Billow to return call to inquire about refill request and denial.   01/02/2020 - No return call from Dr Gabriel Carina office, patient has an upcoming appointment with Dr Gabriel Carina 01/30/2020.  Patient assistance form for Janumet XR 50/1000 mg with [employer] patient assistance program sent to [name], CPP for patient and provider signature.    Allergies:   Allergies  Allergen Reactions   Amoxicillin Swelling    REACTION: rash, swelling Has patient had a PCN reaction causing immediate rash, facial/tongue/throat swelling, SOB or lightheadedness with hypotension: yes Has patient had a PCN reaction causing severe rash involving mucus membranes or skin necrosis: no Has patient had a PCN reaction that required hospitalization: no Has patient had a PCN reaction occurring within the last 10 years: yes If all of the above answers are "NO", then may proceed with Cephalosporin use.    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    ALPRAZolam (XANAX) 0.5 MG tablet Take 1 tablet (0.5 mg  total) by mouth 2 (two) times daily as needed for anxiety or sleep.    amitriptyline (ELAVIL) 50 MG tablet Take 1 tablet (50 mg total) by mouth at bedtime.    atorvastatin (LIPITOR) 10 MG tablet TAKE 1 TABLET(10 MG) BY MOUTH DAILY    azithromycin (ZITHROMAX Z-PAK) 250 MG tablet Take 2 pills by mouth today and then 1 pill daily for 4 days    b complex vitamins tablet Take 1 tablet by mouth daily.      busPIRone (BUSPAR) 15 MG tablet Take 1 tablet (15 mg total) by mouth 2 (two) times daily.    chlorpheniramine-HYDROcodone (TUSSIONEX PENNKINETIC ER) 10-8 MG/5ML SUER Take 5 mLs by mouth every 12 (twelve) hours as needed for cough. Caution of sedation    Cholecalciferol (VITAMIN D-1000 MAX ST) 25 MCG (1000 UT) tablet Take 2,000 Units by mouth daily.     glipiZIDE (GLUCOTROL) 10 MG tablet Take 10 mg by mouth 2 (two) times daily before a meal.     glucose blood test strip To check blood glucose once daily and prn for diabetes    JANUMET XR 50-1000 MG TB24 Take 1 tablet by mouth 2 (two) times daily.     lidocaine-prilocaine (EMLA) cream Apply 1 application topically as needed. Apply to port when going through Chemo    MELOXICAM PO Take by mouth as needed.    Multiple Vitamin (MULTIVITAMIN) tablet Take 1 tablet by mouth daily.    Multiple Vitamins-Minerals (WOMENS MULTIVITAMIN PO) Take by mouth.    omeprazole (PRILOSEC) 20 MG capsule TAKE 1  CAPSULE(20 MG) BY MOUTH AT BEDTIME    pregabalin (LYRICA) 150 MG capsule Take 1 capsule by mouth 2 (two) times a day. 04/20/2019: Currently taking 1 in the am and 1 at night   rOPINIRole (REQUIP) 1 MG tablet TAKE 1/2 TABLET BY MOUTH EVERY MORNING and TAKE THREE TABLETS BY MOUTH EVERYDAY AT BEDTIME    sertraline (ZOLOFT) 100 MG tablet TAKE 2 TABLETS BY MOUTH EVERY DAY    Suvorexant (BELSOMRA) 20 MG TABS Take 20 mg by mouth at bedtime.    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   Preoperative examination 04/27/2017   Carpal tunnel syndrome of right wrist 11/13/2016   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 of  02/17/2016   Obstructive sleep apnea 01/01/2016   Carcinoma of upper-outer quadrant of left breast in female, estrogen receptor negative ([place]) 11/21/2015   Hypomagnesemia 08/28/2015   Initial Medicare annual wellness visit 10/16/2014   Estrogen deficiency 10/16/2014   Colon cancer screening 10/16/2014   Controlled type 2 diabetes mellitus without complication ([place]) 78/24/2353   RLS (restless legs syndrome) 02/21/2014   Chronic neck pain 07/25/2010   Depression 07/07/2010   Routine general medical examination at a health care facility 06/26/2010   B12 deficiency 09/04/2009   Anemia, iron deficiency 08/29/2009   Anxiety state 08/26/2009   GASTROPARESIS 08/26/2009   Vitamin D deficiency 07/19/2008   Disorder of bone and cartilage 07/19/2007   Hyperlipidemia 07/18/2007   EDEMA 07/18/2007   Skin lesion, superficial 06/02/2007    Follow-Up:  Care Coordination with Outside Provider, Coordination of Enhanced Pharmacy Services and Patient [place], [place] Pharmacist Assistant [phone]  I have reviewed the care management and care coordination activities outlined in this encounter and I am certifying that I agree with the content of this note. No further action required.  [name], PharmD Clinical Pharmacist [place] [facility] at [facility] [phone]

## 2019-12-22 ENCOUNTER — Telehealth: Payer: Self-pay | Admitting: *Deleted

## 2019-12-22 MED ORDER — HYDROCOD POLST-CPM POLST ER 10-8 MG/5ML PO SUER: 5.0000 mL | Freq: Two times a day (BID) | ORAL | 0 refills | Status: DC | PRN

## 2019-12-22 NOTE — Telephone Encounter (Signed)
Patient called stating that she has finished her antibiotic. Patient stated that she is out of her cough medication and feels that she needs a refill on it. Patient stated that she has a dry cough and the left side of her back is sore from coughing so much she thinks Patient stated that she had a low grade fever yesterday of 99.0 but tylenol took care of that. Patient denies a fever today. [place]   Patient wants to know when she needs to have another chest xray.

## 2019-12-22 NOTE — Telephone Encounter (Signed)
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

## 2019-12-22 NOTE — Telephone Encounter (Signed)
Pt notified of Dr. Marliss Coots instructions and that Rx was sent to pharmacy. Pt verbalized understanding

## 2020-01-02 ENCOUNTER — Other Ambulatory Visit: Payer: Self-pay

## 2020-01-02 ENCOUNTER — Ambulatory Visit: Payer: PPO

## 2020-01-02 DIAGNOSIS — G2581 Restless legs syndrome: Secondary | ICD-10-CM

## 2020-01-02 DIAGNOSIS — E119 Type 2 diabetes mellitus without complications: Secondary | ICD-10-CM

## 2020-01-02 NOTE — Chronic Care Management (AMB) (Addendum)
Chronic Care Management Pharmacy  Name: Suzanne Strong  MRN: [id number] DOB: 1945/12/17  Chief Complaint/ HPI  Suzanne Strong,  74 y.o., female presents for their Follow-Up CCM visit with the clinical pharmacist via telephone.  PCP : [name], MD   Their chronic conditions include: hyperlipidemia, type 2 diabetes, chronic pain, restless leg syndrome, anxiety, depression, vitamin D deficiency, gastroparesis   Patient concerns:  Reports primary health concern is her cough and restless legs   Office Visits:  09/18/19: [facility] - Continue your requip 3 mg at night. Take 1/2 of the 1 mg pill at lunch time if needed   02/21/19: [facility] - cough, prednisone and cough syrup  Consult Visit:  08/01/19: Endocrinology - Hgb A1c is now 6.9%, which is stable from prior. Her One Touch Ultra mini glucometer was reviewed. She checks a fasting sugar 4 days per week. Fasting sugars range 92 - 220 mg/dl over last 4 weeks. No hypoglycemia. Diabetes is controlled. 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 She is up to date on annual eye exams.  01/27/19: Endocrinology - diabetes controlled, continue low carb diet, check sugars daily, cont ACEI and statin  07/26/18: Endocrinology - discontinue gabapentin, replace with Lyrica 150 mg BID   Allergies  Allergen Reactions  . Amoxicillin Swelling    REACTION: rash, swelling Has patient had a PCN reaction causing immediate rash, facial/tongue/throat swelling, SOB or lightheadedness with hypotension: yes Has patient had a PCN reaction causing severe rash involving mucus membranes or skin necrosis: no Has patient had a PCN reaction that required hospitalization: no Has patient had a PCN reaction occurring within the last 10 years: yes If all of the above answers are "NO", then may proceed with Cephalosporin use.   [name] Fluconazole Rash    REACTION: hives  . Difuleron [Ferrous Fumarate-Dss]   . 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 albuterol (VENTOLIN HFA) 108 (90 Base) MCG/ACT inhaler Inhale 2 puffs into the lungs every 6 (six) hours as needed for wheezing or shortness of breath (tight chest).   . ALPRAZolam (XANAX) 0.5 MG tablet Take 1 tablet (0.5 mg total) by mouth 2 (two) times daily as needed for anxiety or sleep.   [name] amitriptyline (ELAVIL) 50 MG tablet Take 1 tablet (50 mg total) by mouth at bedtime.   [name] atorvastatin (LIPITOR) 10 MG tablet TAKE 1 TABLET(10 MG) BY MOUTH DAILY   . azithromycin (ZITHROMAX Z-PAK) 250 MG tablet Take 2 pills by mouth today and then 1 pill daily for 4 days   . b complex vitamins tablet Take 1 tablet by mouth daily.     . busPIRone (BUSPAR) 15 MG tablet Take 1 tablet (15 mg total) by mouth 2 (two) times daily.   . chlorpheniramine-HYDROcodone (TUSSIONEX PENNKINETIC ER) 10-8 MG/5ML SUER Take 5 mLs by mouth every 12 (twelve) hours as needed for cough. Caution of sedation   . Cholecalciferol (VITAMIN D-1000 MAX ST) 25 MCG (1000 UT) tablet Take 2,000 Units by mouth daily.    [name] glipiZIDE (GLUCOTROL) 10 MG tablet Take 10 mg by mouth 2 (two) times daily before a meal.    . glucose blood test strip To check blood glucose once daily and prn for diabetes   . JANUMET XR 50-1000 MG TB24 Take 1 tablet by mouth 2 (  two) times daily.    [name] lidocaine-prilocaine (EMLA) cream Apply 1 application topically as needed. Apply to port when going through Chemo   . MELOXICAM PO Take by mouth as needed.   . Multiple Vitamin (MULTIVITAMIN) tablet Take 1 tablet by mouth daily.   . Multiple Vitamins-Minerals (WOMENS MULTIVITAMIN PO) Take by mouth.   [name] omeprazole (PRILOSEC) 20 MG capsule TAKE 1 CAPSULE(20 MG) BY MOUTH AT BEDTIME   . pregabalin (LYRICA) 150 MG capsule Take 1 capsule by mouth 2 (two) times a day. 04/20/2019: Currently taking 1 in the am and 1 at night   . rOPINIRole (REQUIP) 1 MG tablet TAKE 1/2 TABLET BY MOUTH EVERY MORNING and TAKE THREE TABLETS BY MOUTH EVERYDAY AT BEDTIME   . sertraline (ZOLOFT) 100 MG tablet TAKE 2 TABLETS BY MOUTH EVERY DAY   . Suvorexant (BELSOMRA) 20 MG TABS Take 20 mg by mouth at bedtime.    Facility-Administered Encounter Medications as of 01/02/2020  Medication  . sodium chloride flush (NS) 0.9 % injection 10 mL   Current Diagnosis/Assessment: Goals    . Patient Stated     Starting 07/15/18, I will continue to take medications as prescribed.     . Pharmacy Care Plan     CARE PLAN ENTRY  Current Barriers:  . Chronic Disease Management support, education, and care coordination needs related to diabetes, RLS  Pharmacist Clinical Goal(s):  [name] Over the next 6 months, patient will work with PharmD and primary care provider to address the following goals: o Diabetes: Maintain A1c < 7%, work towards exercise goal of walking three days per week. o Restless Leg Syndrome: Improve symptoms of RLS  Interventions: . Comprehensive medication review performed . Reviewed home BG monitoring, diet and exercise . Reviewed lifestyle changes for restless leg syndrome  Patient Self Care Activities:  For the next 6 months until follow up visit:  . Continue to monitor fasting blood glucose daily . Continue to inspect feet for cuts, scrapes, and infection daily . Adhere to heart healthy diet such as the DASH diet (see handout) . Work towards walking 10 minutes, 2-3 days per week  Updated goal documentation      Hyperlipidemia  Lipid Panel     Component Value Date/Time   CHOL 144 07/15/2018 0927   TRIG 180.0 (H) 07/15/2018 0927   HDL 44.40 07/15/2018 0927   CHOLHDL 3 07/15/2018 0927   VLDL 36.0 07/15/2018 0927   LDLCALC 64 07/15/2018 0927   LDLDIRECT 97.0 06/29/2017 0902    The 10-year ASCVD risk score [name], et al., 2013) is: 16.2%   Values used to calculate the score:     Age: 69 years     Sex:  Female     Is Non-Hispanic African American: No     Diabetic: Yes     Tobacco smoker: No     Systolic Blood Pressure: 259 mmHg     Is BP treated: No     HDL Cholesterol: 44.4 mg/dL     Total Cholesterol: 144 mg/dL   LDL goal < 100 Patient has failed these meds in past: none Patient is currently controlled on the following medications:  Atorvastatin 10 mg - 1 tablet daily (AM)  Update 01/02/20: Refills timely < 5 day gap between refills, due for updated lipid panel   Plan: Continue current medications  Diabetes   Followed by Dr. Gabriel Carina - pt reports last A1c 6.8% Recent Relevant Labs: Lab Results  Component Value Date/Time  HGBA1C 8.0 (H) 06/29/2017 09:02 AM   HGBA1C 7.3 12/09/2016 12:00 AM   HGBA1C 7.0 (H) 09/18/2014 08:40 AM    Patient has failed these meds in past: metformin (nausea/vomiting) Patient is currently controlled on the following medications:   Glipizide 10 mg - 1 tablet BID (breakfast and dinner)  Janumet XR 50-1000 mg - 1 tablet BID (breakfast and dinner)  Last diabetic eye exam:  Lab Results  Component Value Date/Time   HMDIABEYEEXA No Retinopathy 06/19/2019 12:00 AM    Last diabetic foot exam: Dr. Gabriel Carina evaluates at each visit; pt checks her feet daily   Update 01/02/20: Reports she hasn't been eating as well lately. BG 120-140 first thing in the morning. Checking 3-4 days per week. Paying $147 monthly for Janumet. Has not applied for patient assistance. Exercise has been minimal. Reports diet was a lot better when she was cooking for her late husband. Has been difficult to cook for one. Eats a baked potato and salad. Not eating as much protein as she should. Has noticed her clothes are a little bit tighter around the waist. No longer walking after work but would like to get back into it.  Additional CV risk factors: Denies history of high BP. Encouraged her to resume walking as exercise can also help with RLS.   Plan: Continue current medications;  Encouraged her to resume walking as exercise can also help with RLS. Review income requirements for patient assistance. CMA will mail forms to patient.   Anxiety/Depression   Followed by Anderson Malta at [facility]  Patient has failed these meds in past: none Patient is currently controlled on the following medications:   Sertraline 100 mg - 2 tablets daily (AM)  Alprazolam 0.5 mg - 1 tablet BID PRN anxiety/sleep  Trazodone 100 mg - 1/2 to 1 tablet qhs PRN sleep  Belsomra 20 mg - 1 tablet qhs (started 11/15/19)  Update 01/02/20: Reports overall doing well, she is taking things one day at a time. Still grieving her husband's passing 03/2019.  Reports she is still taking all of the above medications. Started Belsomra 10/2019 and reports she falls asleep quickly and able to stay asleep longer. Reports sleeping 6-7 hours most nights and is satisfied, well rested.   Plan: Continue current medications  Chronic Pain/Neuropathy   Patient has failed these meds in past: gabapentin  Patient is currently controlled on the following medications:   Pregabalin 150 mg - 1 capsule BID  Alpha lipoic acid 200 mg OTC - 1 tablet qhs  Meloxicam 7.5 mg - 1 tablet daily  We discussed: At previous visit 05/2019, pt noticed some improvement in pain with addition of Lyrica, denies OTC pain meds, also started alpha lipoic acid 200 mg OTC recently - 1 tablet qhs  Update 01/02/20: Reports doing well on above medications, denies worsening in symptoms since gabapentin was discontinued (05/2019)   Plan: Continue current medications  RLS   Patient has failed these meds in past: none Patient is currently uncontrolled on the following medications:   Ropinirole 1 mg - 1/2 tablet every morning and 3 tablets at bedtime   Amitriptyline 50 mg - 1 tablet qhs  Update 01/02/20: Reports continues to worsen in afternoon and evening; Seen by PCP 09/2019, increased ropinirole by 1/2 tablet and evaluated for  anemia. F/u 01/05/20. Reports she was told to increase vitamin D and C levels per hematology.   Plan: Continue current medications; Please call if symptoms not improving or worsening after PCP visit this  week.   Gastroparesis   Patient has failed these meds in past: none Patient is currently controlled on the following medications:   Omeprazole 20 mg - 1 tablet daily at bedtime  Update 01/02/20: reports indigestion the last 3 days, continues omeprazole every evening, relieved with a Tum   Plan: Continue current medications  Vitamin D Deficiency    Vitamin D (07/15/2018): 29 - started vitamin D at this time  Patient has failed these meds in past: none Patient is currently controlled on the following medications:   Vitamin D 50 mcg - 3 capsules daily   Vitafusion energy metabolism and bone support  Updated 01/02/20: Increased vitamin D by 1 capsule per oncology, also added on vitamin C in the vitafusion supplement  Plan: Continue current medications  Chronic Cough   Reports cough is still lingering from bronchitis in 2020 Patient has failed these meds in past: multiple cough medications Patient is currently uncontrolled on the following medications:    Albuterol 90 mcg - PRN ( increased to 3-4 days per week the last few months from 1-2 days per week 05/2019)   Tussionex - 5 mL q12H  (taking 2 mL every 12 hours instead of 5 mL because it knocks her out)  We discussed: Chest x-ray completed recently, started on antibiotic and tussionex and will repeat chest x-ray in a few weeks. Unable to take the tussionex and drive so only taking at night. Reports the Tussinoex is the only cough medicine that has ever helped. Uses cough drops with honey in it. Stays hydrated. Finished antibiotic and pending repeat chest x-ray.   Plan: Continue current medications  Medication Management  Misc: OTCs: B-complex, multivitamin --> update 01/02/20 confirms still taking  Pharmacy/Benefits:   [facility] (would like to get Lyrica from [facility], last [facility])  Adherence: adherence packaging from [facility], refills timely   Affordability: Janumet, Belsomra (cost high, but able to afford)  CCM Follow Up: 3 months (telephone)  [name], PharmD Clinical Pharmacist [facility] at [facility] [phone]

## 2020-01-02 NOTE — Patient Instructions (Signed)
Dear [name],  Below is a summary of the goals we discussed during our follow up appointment on January 02, 2020. Please contact me anytime with questions or concerns.   Visit Information  Goals Addressed            This Visit's Progress   . Pharmacy Care Plan       CARE PLAN ENTRY  Current Barriers:  . Chronic Disease Management support, education, and care coordination needs related to diabetes, RLS  Pharmacist Clinical Goal(s):  [name] Over the next 6 months, patient will work with PharmD and primary care provider to address the following goals: o Diabetes: Maintain A1c < 7%, work towards exercise goal of walking three days per week. o Restless Leg Syndrome: Improve symptoms of RLS  Interventions: . Comprehensive medication review performed . Reviewed home BG monitoring, diet and exercise . Reviewed lifestyle changes for restless leg syndrome  Patient Self Care Activities:  For the next 6 months until follow up visit:  . Continue to monitor fasting blood glucose daily . Continue to inspect feet for cuts, scrapes, and infection daily . Adhere to heart healthy diet such as the DASH diet (see handout) . Work towards walking 10 minutes, 2-3 days per week  Updated goal documentation       The patient verbalized understanding of instructions, educational materials, and care plan provided today and agreed to receive a mailed copy of patient instructions, educational materials, and care plan.   The pharmacy team will reach out to the patient again over the next 30 days.   [name], PharmD Clinical Pharmacist [facility] at [facility] [phone]  Restless Legs Syndrome Restless legs syndrome is a condition that causes uncomfortable feelings or sensations in the legs, especially while sitting or lying down. The sensations usually cause an overwhelming urge to move the legs. The arms can also sometimes be affected. The condition can range from mild  to severe. The symptoms often interfere with a person's ability to sleep. What are the causes? The cause of this condition is not known. What increases the risk? The following factors may make you more likely to develop this condition:  Being older than 50.  Pregnancy.  Being a woman. In general, the condition is more common in women than in men.  A family history of the condition.  Having iron deficiency.  Overuse of caffeine, nicotine, or alcohol.  Certain medical conditions, such as kidney disease, Parkinson's disease, or nerve damage.  Certain medicines, such as those for high blood pressure, nausea, colds, allergies, depression, and some heart conditions. What are the signs or symptoms? The main symptom of this condition is uncomfortable sensations in the legs, such as:  Pulling.  Tingling.  Prickling.  Throbbing.  Crawling.  Burning. Usually, the sensations:  Affect both sides of the body.  Are worse when you sit or lie down.  Are worse at night. These may wake you up or make it difficult to fall asleep.  Make you have a strong urge to move your legs.  Are temporarily relieved by moving your legs. The arms can also be affected, but this is rare. People who have this condition often have tiredness during the day because of their lack of sleep at night. How is this diagnosed? This condition may be diagnosed based on:  Your symptoms.  Blood tests. In some cases, you may be monitored in a sleep lab by a specialist (a sleep study). This can detect any  disruptions in your sleep. How is this treated? This condition is treated by managing the symptoms. This may include:  Lifestyle changes, such as exercising, using relaxation techniques, and avoiding caffeine, alcohol, or tobacco.  Medicines. Anti-seizure medicines may be tried first. Follow these instructions at home:     General instructions  Take over-the-counter and prescription medicines only as  told by your health care provider.  Use methods to help relieve the uncomfortable sensations, such as: ? Massaging your legs. ? Walking or stretching. ? Taking a cold or hot bath.  Keep all follow-up visits as told by your health care provider. This is important. Lifestyle  Practice good sleep habits. For example, go to bed and get up at the same time every day. Most adults should get 7-9 hours of sleep each night.  Exercise regularly. Try to get at least 30 minutes of exercise most days of the week.  Practice ways of relaxing, such as yoga or meditation.  Avoid caffeine and alcohol.  Do not use any products that contain nicotine or tobacco, such as cigarettes and e-cigarettes. If you need help quitting, ask your health care provider. Contact a health care provider if:  Your symptoms get worse or they do not improve with treatment. Summary  Restless legs syndrome is a condition that causes uncomfortable feelings or sensations in the legs, especially while sitting or lying down.  The symptoms often interfere with a person's ability to sleep.  This condition is treated by managing the symptoms. You may need to make lifestyle changes or take medicines. This information is not intended to replace advice given to you by your health care provider. Make sure you discuss any questions you have with your health care provider. Document Revised: 02/22/2017 Document Reviewed: 02/22/2017 Elsevier Patient Education  [place].

## 2020-01-02 NOTE — Progress Notes (Signed)
I have collaborated with the care management provider regarding care management and care coordination activities outlined in this encounter and have reviewed this encounter including documentation in the note and care plan. I am certifying that I agree with the content of this note and encounter as supervising physician. [name] MD

## 2020-01-05 ENCOUNTER — Ambulatory Visit (INDEPENDENT_AMBULATORY_CARE_PROVIDER_SITE_OTHER)
Admission: RE | Admit: 2020-01-05 | Discharge: 2020-01-05 | Disposition: A | Discharge status: Home / Self Care | Payer: PPO | Source: Ambulatory Visit | Attending: Family Medicine | Admitting: Family Medicine

## 2020-01-05 ENCOUNTER — Ambulatory Visit (INDEPENDENT_AMBULATORY_CARE_PROVIDER_SITE_OTHER): Payer: PPO | Admitting: Family Medicine

## 2020-01-05 ENCOUNTER — Other Ambulatory Visit: Payer: Self-pay

## 2020-01-05 ENCOUNTER — Encounter: Payer: Self-pay | Admitting: Family Medicine

## 2020-01-05 VITALS — BP 128/70 | HR 88 | Temp 97.5°F | Ht 66.0 in | Wt 227.4 lb

## 2020-01-05 DIAGNOSIS — R509 Fever, unspecified: Secondary | ICD-10-CM | POA: Diagnosis not present

## 2020-01-05 DIAGNOSIS — E119 Type 2 diabetes mellitus without complications: Secondary | ICD-10-CM

## 2020-01-05 DIAGNOSIS — R3915 Urgency of urination: Secondary | ICD-10-CM | POA: Diagnosis not present

## 2020-01-05 DIAGNOSIS — R059 Cough, unspecified: Secondary | ICD-10-CM | POA: Diagnosis not present

## 2020-01-05 LAB — POC URINALSYSI DIPSTICK (AUTOMATED)
Bilirubin, UA: 1
Blood, UA: NEGATIVE
Glucose, UA: NEGATIVE
Ketones, UA: 5
Leukocytes, UA: NEGATIVE
Nitrite, UA: NEGATIVE
Protein, UA: POSITIVE — AB
Spec Grav, UA: >=1.03 — AB (ref 1.010–1.025)
Urobilinogen, UA: 0.2 E.U./dL
pH, UA: 6 (ref 5.0–8.0)

## 2020-01-05 NOTE — Progress Notes (Signed)
Subjective:    Patient ID: Suzanne Strong, female    DOB: 03/12/1945, 74 y.o.   MRN: [id number]  This visit occurred during the SARS-CoV-2 public health emergency.  Safety protocols were in place, including screening questions prior to the visit, additional usage of staff PPE, and extensive cleaning of exam room while observing appropriate contact time as indicated for disinfecting solutions.    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 Patient had pneumonia this summer. EXAM: CHEST - 2 VIEW COMPARISON:  Most recent comparison 01/19/2017 FINDINGS: Removal of prior right chest port. There is slight interstitial coarsening with ill-defined streaky opacity in the left lung base. No findings of pulmonary edema. No pleural fluid or pneumothorax. Thoracic spondylosis. No acute osseous abnormalities are seen. IMPRESSION: Ill-defined streaky opacity in the left lung base may be atelectasis or scarring. In the setting of fever, pneumonia is not entirely excluded, but felt less likely. Consider radiographic follow-up to assess for stability or resolution in 3-4 weeks. Electronically Signed   By:  Keith Rake M.D.   On: 12/13/2019 00:25   Had neg flu and covid swabs  Given tussionex   Did c/o intermittent elevated temp  DM 2  Under care of endocrinology janumet and glipizide  Dr Gabriel Carina  Highest glucose is 140 and last a1c below 7    Sees psychiatry  Fairly stable - still grieving from husband   [place] covid vaccines and booster   Lab Results  Component Value Date   CREATININE 0.88 10/20/2019   BUN 19 10/20/2019   NA 139 10/20/2019   K 4.2 10/20/2019   CL 103 10/20/2019   CO2 26 10/20/2019   Lab Results  Component Value Date   ALT 19 10/20/2019   AST 23 10/20/2019   ALKPHOS 74 10/20/2019   BILITOT 0.3 10/20/2019   ca was 8.8  Lab Results  Component Value Date   WBC 5.4 10/20/2019   HGB 10.5 (L) 10/20/2019   HCT 32.4 (L) 10/20/2019   MCV 87.6 10/20/2019   PLT 178 10/20/2019  followed by oncology  Also getting reclast infusions  Upon looking up -see that cough is a possible side eff 12-22% Can have fever as acute phase rxn  Had some urinary symptoms earlier in the week  Urgency -then not much urine to pass   Sees Dr Rogue Bussing for oncology  Patient Active Problem List   Diagnosis Date Noted  . Urinary urgency 01/05/2020  . Grief reaction 09/18/2019  . Swallowing dysfunction 07/18/2019  .  Cough 02/21/2019  . Fever, intermittent 09/01/2018  . Pre-syncope 02/07/2018  . Orthostatic hypotension 01/26/2018  . Episodic weakness 01/26/2018  . Medicare annual wellness visit, subsequent 06/29/2017  . 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 Preoperative examination 04/27/2017  . Carpal tunnel syndrome of right wrist 11/13/2016  . 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 Obstructive sleep apnea 01/01/2016  . Carcinoma of upper-outer quadrant of left breast in female, estrogen receptor negative ([place]) 11/21/2015  . Hypomagnesemia 08/28/2015  . Initial Medicare annual wellness visit 10/16/2014  . Estrogen deficiency 10/16/2014  .  Colon cancer screening 10/16/2014  . Controlled type 2 diabetes mellitus without complication ([place]) 35/57/3220  . RLS (restless legs syndrome) 02/21/2014  . Chronic neck pain 07/25/2010  . Depression 07/07/2010  . Routine general medical examination at a health care facility 06/26/2010  . B12 deficiency 09/04/2009  . Anemia, iron deficiency 08/29/2009  . Anxiety state 08/26/2009  . GASTROPARESIS 08/26/2009  . Vitamin D deficiency 07/19/2008  . Disorder of bone and cartilage 07/19/2007  . Hyperlipidemia 07/18/2007  . EDEMA 07/18/2007  . Skin lesion, superficial 06/02/2007   Past Medical History:  Diagnosis Date  . Anemia    iron deficiency and B12 deficiency  . Anxiety   . Breast cancer ([place]) 02/25/2015   upper-outer quadrant of left female breast, Triple Negative. Neo-adjuvant chemo with complete pathologic response.   . Cervical dysplasia    conization  . Diabetes mellitus without complication ([place])   . Diverticulosis   . Fever 04/11/2015  . Gastroparesis    related to previous radiation tx  . GERD (gastroesophageal reflux disease)   . Hyperlipidemia   . Hypertension   . Neuropathy    legs/feet, S/P chemo meds  . Personal history of chemotherapy   . Personal history of radiation therapy 2017   LEFT lumpectomy w/ radiation  . RLS (restless legs syndrome)   . Shingles    Hx of  . 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 Wears dentures    partial bottom   Past Surgical History:  Procedure Laterality Date  . APPENDECTOMY    . BREAST BIOPSY Left 02/25/2015   INVASIVE MAMMARY CARCINOMA,Triple negative.  [name] BREAST CYST ASPIRATION    . BREAST EXCISIONAL BIOPSY Left 12/2015   surgery  . BREAST LUMPECTOMY WITH NEEDLE LOCALIZATION Left 10/02/2015   Procedure: BREAST LUMPECTOMY WITH NEEDLE LOCALIZATION;  Surgeon: [name], MD;  Location: [facility];  Service: General;  Laterality: Left;  . BREAST LUMPECTOMY WITH SENTINEL LYMPH NODE BIOPSY Left 10/02/2015   Procedure: LEFT BREAST  WIDE EXCISION WITH SENTINEL LYMPH NODE BX;  Surgeon: [name], MD;  Location: [facility];  Service: General;  Laterality: Left;  . BREAST SURGERY     breast biopsy benign  . CARPAL TUNNEL RELEASE Right 05/19/2017   Procedure: CARPAL TUNNEL RELEASE;  Surgeon: Earnestine Leys, MD;  Location: [facility];  Service: Orthopedics;  Laterality: Right;  . CATARACT EXTRACTION W/PHACO Right 10/26/2018   Procedure: CATARACT EXTRACTION PHACO AND INTRAOCULAR LENS PLACEMENT ([place]) right diabetic;  Surgeon: Leandrew Koyanagi, MD;  Location: [place];  Service: Ophthalmology;  Laterality: Right;  Diabetic - oral meds cancer center been notified and will come  . CATARACT EXTRACTION W/PHACO Left 11/16/2018   Procedure: CATARACT EXTRACTION PHACO AND INTRAOCULAR LENS PLACEMENT ([facility]) LEFT DIABETIC  01:01.0  17.0%  10.37;  Surgeon: Leandrew Koyanagi, MD;  Location: [place];  Service: Ophthalmology;  Laterality: Left;  Diabetic - oral  meds Port-a-cath  . CHOLECYSTECTOMY    . Cologuard  07/22/2016   Negative  . ESOPHAGOGASTRODUODENOSCOPY  07/2009   normal with few gastric polyps  . MOLE REMOVAL    . PORT-A-CATH REMOVAL    . PORTACATH PLACEMENT    . PORTACATH PLACEMENT Right 03/12/2015   Procedure: INSERTION PORT-A-CATH;  Surgeon: [name], MD;  Location: [facility];  Service: General;  Laterality: Right;  . RADICAL NECK DISSECTION    . TONSILLECTOMY     cancer treated with chemo and radiation  . TRIGGER FINGER RELEASE Right 05/19/2017   Procedure: RELEASE TRIGGER FINGER/A-1 PULLEY ring finger;  Surgeon: Earnestine Leys, MD;  Location: [facility];  Service: Orthopedics;  Laterality: Right;   Social History   Tobacco Use  . Smoking status: Former Smoker    Packs/day: 0.50    Years: 6.00    Pack years: 3.00    Types: Cigarettes    Quit date: 03/10/1984    Years since quitting: 35.8  . Smokeless tobacco: Never Used  Vaping Use  . Vaping Use: Never used  Substance Use Topics  .  Alcohol use: No    Alcohol/week: 0.0 standard drinks  . Drug use: No   Family History  Problem Relation Age of Onset  . Osteoporosis Mother   . Hyperlipidemia Mother   . Depression Mother   . Cancer Mother        skin cancer ? basal cell and lung ca heavy smoker  . Alcohol abuse Father   . Cancer Father        skin CA ? basal cell  . Heart disease Father        CHF  . Hyperlipidemia Brother   . Hypertension Brother   . Breast cancer Neg Hx    Allergies  Allergen Reactions  . Amoxicillin Swelling    REACTION: rash, swelling Has patient had a PCN reaction causing immediate rash, facial/tongue/throat swelling, SOB or lightheadedness with hypotension: yes Has patient had a PCN reaction causing severe rash involving mucus membranes or skin necrosis: no Has patient had a PCN reaction that required hospitalization: no Has patient had a PCN reaction occurring within the last 10 years: yes If all of the above answers are "NO", then may proceed with Cephalosporin use.   [name] Fluconazole Rash    REACTION: hives  . Difuleron [Ferrous Fumarate-Dss]   . 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 albuterol (VENTOLIN HFA) 108 (90 Base) MCG/ACT inhaler Inhale 2 puffs into the lungs every 6 (six) hours as needed for wheezing or shortness of breath (tight chest). 18 g 3  . ALPRAZolam (XANAX) 0.5 MG tablet Take 1 tablet (0.5 mg total) by mouth 2 (two) times daily as needed for anxiety or sleep. 60 tablet 5  . amitriptyline (ELAVIL) 50 MG tablet Take 1 tablet (50 mg total) by mouth at bedtime. 90 tablet 3  . atorvastatin (LIPITOR) 10 MG tablet TAKE 1 TABLET(10 MG) BY MOUTH DAILY 90 tablet 3  . b complex vitamins tablet Take 1 tablet by mouth daily.      . busPIRone (BUSPAR) 15 MG tablet Take 1 tablet (15 mg total) by mouth 2 (two) times daily.  180 tablet 0  . chlorpheniramine-HYDROcodone (TUSSIONEX PENNKINETIC ER) 10-8 MG/5ML SUER Take 5 mLs by mouth every 12 (twelve)  hours as needed for cough. Caution of sedation 60 mL 0  . Cholecalciferol (VITAMIN D-1000 MAX ST) 25 MCG (1000 UT) tablet Take 2,000 Units by mouth daily.     [name] glipiZIDE (GLUCOTROL) 10 MG tablet Take 10 mg by mouth 2 (two) times daily before a meal.     . glucose blood test strip To check blood glucose once daily and prn for diabetes 100 each 3  . JANUMET XR 50-1000 MG TB24 Take 1 tablet by mouth 2 (two) times daily.   0  . lidocaine-prilocaine (EMLA) cream Apply 1 application topically as needed. Apply to port when going through Chemo    . MELOXICAM PO Take by mouth as needed.    . Multiple Vitamin (MULTIVITAMIN) tablet Take 1 tablet by mouth daily.    . Multiple Vitamins-Minerals (WOMENS MULTIVITAMIN PO) Take by mouth.    [name] omeprazole (PRILOSEC) 20 MG capsule TAKE 1 CAPSULE(20 MG) BY MOUTH AT BEDTIME 90 capsule 3  . rOPINIRole (REQUIP) 1 MG tablet TAKE 1/2 TABLET BY MOUTH EVERY MORNING and TAKE THREE TABLETS BY MOUTH EVERYDAY AT BEDTIME 315 tablet 5  . sertraline (ZOLOFT) 100 MG tablet TAKE 2 TABLETS BY MOUTH EVERY DAY 180 tablet 1  . pregabalin (LYRICA) 150 MG capsule Take 1 capsule by mouth 2 (two) times a day.    . Suvorexant (BELSOMRA) 20 MG TABS Take 20 mg by mouth at bedtime. 30 tablet 2   Current Facility-Administered Medications on File Prior to Visit  Medication Dose Route Frequency Provider Last Rate Last Admin  . sodium chloride flush (NS) 0.9 % injection 10 mL  10 mL Intravenous PRN Cammie Sickle, MD   10 mL at 09/11/15 0845    Review of Systems  Constitutional: Positive for fever. Negative for activity change, appetite change, fatigue and unexpected weight change.       Low grade temp elevation twice weekly  HENT: Positive for trouble swallowing. Negative for congestion, ear pain, rhinorrhea, sinus pressure and sore throat.        Dry mouth and  throat affects swallowing  Eyes: Negative for pain, redness and visual disturbance.  Respiratory: Positive for cough. Negative for apnea, choking, chest tightness, shortness of breath, wheezing and stridor.   Cardiovascular: Negative for chest pain and palpitations.  Gastrointestinal: Negative for abdominal pain, blood in stool, constipation and diarrhea.  Endocrine: Negative for polydipsia and polyuria.  Genitourinary: Negative for dysuria, frequency and urgency.  Musculoskeletal: Negative for arthralgias, back pain and myalgias.  Skin: Negative for pallor and rash.  Allergic/Immunologic: Negative for environmental allergies.  Neurological: Negative for dizziness, syncope and headaches.  Hematological: Negative for adenopathy. Does not bruise/bleed easily.  Psychiatric/Behavioral: Negative for decreased concentration and dysphoric mood. The patient is not nervous/anxious.        Objective:   Physical Exam Constitutional:      General: She is not in acute distress.    Appearance: Normal appearance. She is well-developed. She is obese. She is not ill-appearing or diaphoretic.  HENT:     Head: Normocephalic and atraumatic.     Right Ear: Tympanic membrane and ear canal normal.     Left Ear: Tympanic membrane and ear canal normal.     Mouth/Throat:     Mouth: Mucous membranes are dry.     Comments: Baseline dry MM Eyes:     General: No scleral icterus.       Right eye: No discharge.        Left eye:  No discharge.     Extraocular Movements: Extraocular movements intact.     Conjunctiva/sclera: Conjunctivae normal.     Pupils: Pupils are equal, round, and reactive to light.  Neck:     Thyroid: No thyromegaly.     Vascular: No carotid bruit or JVD.  Cardiovascular:     Rate and Rhythm: Normal rate and regular rhythm.     Heart sounds: Normal heart sounds. No gallop.   Pulmonary:     Effort: Pulmonary effort is normal. No respiratory distress.     Breath sounds: Normal breath  sounds. No stridor. No wheezing, rhonchi or rales.     Comments: Good air exch Chest:     Chest wall: No tenderness.  Abdominal:     General: Bowel sounds are normal. There is no distension or abdominal bruit.     Palpations: Abdomen is soft. There is no mass.     Tenderness: There is no abdominal tenderness.  Musculoskeletal:     Cervical back: Normal range of motion and neck supple.     Right lower leg: No edema.     Left lower leg: No edema.  Lymphadenopathy:     Cervical: No cervical adenopathy.  Skin:    General: Skin is warm and dry.     Coloration: Skin is not jaundiced or pale.     Findings: No erythema or rash.  Neurological:     Mental Status: She is alert.     Sensory: No sensory deficit.     Coordination: Coordination normal.     Deep Tendon Reflexes: Reflexes are normal and symmetric. Reflexes normal.  Psychiatric:        Mood and Affect: Mood normal.           Assessment & Plan:   Problem List Items Addressed This Visit      Endocrine   Controlled type 2 diabetes mellitus without complication (HCC)    Taking janumet and glipizide  Sees endocrinology  Last A1C below 7         Other   Fever, intermittent    cxr and ucx today Rev last labs  Has had problem since reclast infusion -? If possible side effect      Relevant Orders   Urine Culture (Completed)   POCT Urinalysis Dipstick (Automated) (Completed)   Cough - Primary    Ongoing for several months  Had aspiration pneumonia (with swallowing study) in June-that is resolved  occ heartburn-takes omeprazole  Does keep a dry mouth from past tx of tonsillar cancer  Also low grade temp twice weekly on avg  cxr today (also re checking a prev opacity)  Taking tussionex which helps  Disc poss of reclast side effect since symptoms started roughly then        Relevant Orders   DG Chest 2 View (Completed)   Urinary urgency    Urine is concentrated- enc to inc water intake  Pending culture       Relevant Orders   Urine Culture (Completed)   POCT Urinalysis Dipstick (Automated) (Completed)

## 2020-01-05 NOTE — Patient Instructions (Addendum)
I want to send your urine for a culture   Xray today   I will reach out to oncology to see if reclast infusion could cause any of your symptoms   Keep drinking lots of fluids    If your indigestion does not clear up please let us know

## 2020-01-06 LAB — URINE CULTURE
MICRO NUMBER:: 11226766
SPECIMEN QUALITY:: ADEQUATE

## 2020-01-07 NOTE — Assessment & Plan Note (Signed)
Ongoing for several months  Had aspiration pneumonia (with swallowing study) in June-that is resolved  occ heartburn-takes omeprazole  Does keep a dry mouth from past tx of tonsillar cancer  Also low grade temp twice weekly on avg  cxr today (also re checking a prev opacity)  Taking tussionex which helps  Disc poss of reclast side effect since symptoms started roughly then

## 2020-01-07 NOTE — Assessment & Plan Note (Signed)
cxr and ucx today Rev last labs  Has had problem since reclast infusion -? If possible side effect

## 2020-01-07 NOTE — Assessment & Plan Note (Signed)
Urine is concentrated- enc to inc water intake  Pending culture

## 2020-01-07 NOTE — Assessment & Plan Note (Signed)
Taking janumet and glipizide  Sees endocrinology  Last A1C below 7

## 2020-01-08 ENCOUNTER — Other Ambulatory Visit: Payer: Self-pay | Admitting: Psychiatry

## 2020-01-08 ENCOUNTER — Telehealth: Payer: Self-pay | Admitting: *Deleted

## 2020-01-08 ENCOUNTER — Telehealth: Payer: Self-pay | Admitting: Psychiatry

## 2020-01-08 ENCOUNTER — Telehealth: Payer: Self-pay

## 2020-01-08 DIAGNOSIS — G4700 Insomnia, unspecified: Secondary | ICD-10-CM

## 2020-01-08 MED ORDER — BELSOMRA 20 MG PO TABS: 20.0000 mg | ORAL_TABLET | Freq: Every day | ORAL | 0 refills | Status: DC

## 2020-01-08 NOTE — Progress Notes (Signed)
Chronic Care Management Pharmacy Assistant   Name: Suzanne Strong  MRN: [id number] DOB: 09/29/1945  Reason for Encounter: Medication Review  Patient Questions:  1.  Have you seen any other providers since your last visit? Yes, Suzanne Strong (PCP)  2.  Any changes in your medicines or health? No   Suzanne Strong,  74 y.o. , female presents for their Follow-Up CCM visit with the clinical pharmacist via telephone.  PCP : Suzanne Greenspan, MD  Allergies:   Allergies  Allergen Reactions  . Amoxicillin Swelling    REACTION: rash, swelling Has patient had a PCN reaction causing immediate rash, facial/tongue/throat swelling, SOB or lightheadedness with hypotension: yes Has patient had a PCN reaction causing severe rash involving mucus membranes or skin necrosis: no Has patient had a PCN reaction that required hospitalization: no Has patient had a PCN reaction occurring within the last 10 years: yes If all of the above answers are "NO", then may proceed with Cephalosporin use.   Suzanne Strong Fluconazole Rash    REACTION: hives  . Difuleron [Ferrous Fumarate-Dss]   . 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 albuterol (VENTOLIN HFA) 108 (90 Base) MCG/ACT inhaler Inhale 2 puffs into the lungs every 6 (six) hours as needed for wheezing or shortness of breath (tight chest).   . ALPRAZolam (XANAX) 0.5 MG tablet Take 1 tablet (0.5 mg total) by mouth 2 (two) times daily as needed for anxiety or sleep.   Suzanne Strong amitriptyline (ELAVIL) 50 MG tablet Take 1 tablet (50 mg total) by mouth at bedtime.   Suzanne Strong atorvastatin (LIPITOR) 10 MG tablet TAKE 1 TABLET(10 MG) BY MOUTH DAILY   . b complex vitamins tablet Take 1 tablet by mouth daily.     . busPIRone (BUSPAR) 15 MG tablet Take 1 tablet (15 mg total) by mouth 2 (two) times daily.   .  chlorpheniramine-HYDROcodone (TUSSIONEX PENNKINETIC ER) 10-8 MG/5ML SUER Take 5 mLs by mouth every 12 (twelve) hours as needed for cough. Caution of sedation   . Cholecalciferol (VITAMIN D-1000 MAX ST) 25 MCG (1000 UT) tablet Take 2,000 Units by mouth daily.    Suzanne Strong glipiZIDE (GLUCOTROL) 10 MG tablet Take 10 mg by mouth 2 (two) times daily before a meal.    . glucose blood test strip To check blood glucose once daily and prn for diabetes   . JANUMET XR 50-1000 MG TB24 Take 1 tablet by mouth 2 (two) times daily.    Suzanne Strong lidocaine-prilocaine (EMLA) cream Apply 1 application topically as needed. Apply to port when going through Chemo   . MELOXICAM PO Take by mouth as needed.   . Multiple Vitamin (MULTIVITAMIN) tablet Take 1 tablet by mouth daily.   . Multiple Vitamins-Minerals (WOMENS MULTIVITAMIN PO) Take by mouth.   Suzanne Strong omeprazole (PRILOSEC) 20 MG capsule TAKE 1 CAPSULE(20 MG) BY MOUTH AT BEDTIME   . pregabalin (LYRICA) 150 MG capsule Take 1 capsule by mouth 2 (two) times a day. 04/20/2019: Currently taking 1 in the am and 1 at night  . rOPINIRole (REQUIP) 1 MG tablet TAKE 1/2 TABLET BY MOUTH EVERY MORNING and TAKE THREE TABLETS BY MOUTH EVERYDAY AT BEDTIME   . sertraline (ZOLOFT) 100 MG tablet TAKE 2 TABLETS BY MOUTH EVERY DAY   . Suvorexant (BELSOMRA) 20 MG TABS Take 20 mg by  mouth at bedtime.    Facility-Administered Encounter Medications as of 01/08/2020  Medication  . sodium chloride flush (NS) 0.9 % injection 10 mL    Current Diagnosis: Patient Active Problem List   Diagnosis Date Noted  . Urinary urgency 01/05/2020  . Grief reaction 09/18/2019  . Swallowing dysfunction 07/18/2019  . Cough 02/21/2019  . Fever, intermittent 09/01/2018  . Pre-syncope 02/07/2018  . Orthostatic hypotension 01/26/2018  . Episodic weakness 01/26/2018  . Medicare annual wellness visit, subsequent 06/29/2017  . 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 Preoperative examination 04/27/2017  . Carpal  tunnel syndrome of right wrist 11/13/2016  . 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 Obstructive sleep apnea 01/01/2016  . Carcinoma of upper-outer quadrant of left breast in female, estrogen receptor negative ([place]) 11/21/2015  . Hypomagnesemia 08/28/2015  . Initial Medicare annual wellness visit 10/16/2014  . Estrogen deficiency 10/16/2014  . Colon cancer screening 10/16/2014  . Controlled type 2 diabetes mellitus without complication ([place]) 07/68/0881  . RLS (restless legs syndrome) 02/21/2014  . Chronic neck pain 07/25/2010  . Depression 07/07/2010  . Routine general medical examination at a health care facility 06/26/2010  . B12 deficiency 09/04/2009  . Anemia, iron deficiency 08/29/2009  . Anxiety state 08/26/2009  . GASTROPARESIS 08/26/2009  . Vitamin D deficiency 07/19/2008  . Disorder of bone and cartilage 07/19/2007  . Hyperlipidemia 07/18/2007  . EDEMA 07/18/2007  . Skin lesion, superficial 06/02/2007    Goals Addressed   None     Follow-Up:  Coordination of Enhanced Pharmacy Services   01/08/2020-Pt did not receive Belsomra 20mg  last month due to incorrect prescription being sent. Called [facility] and requested Belsomra 15mg  in a quantity of 41 to sync with future fill date. (11 for acute fill and qty 30 for December. .. Reviewed chart for medication changes ahead of medication coordination call.  No OVs, Consults, or hospital visits since last care coordination call/Pharmacist visit. (If appropriate, list visit date, provider name)  No medication changes indicated OR if recent visit, treatment plan here.  BP Readings from Last 3 Encounters:  01/05/20 128/70  11/15/19 112/70  10/20/19 131/78    Lab Results  Component Value Date   HGBA1C 8.0 (H) 06/29/2017    Pt is checking blood glucose most days. Reports forgetting some mornings. When taking TUSSIONEX PENNKINETIC ER 10-8 MG/5ML SUER her fasting blood glucose ran 140-150's.  01/07/2020  130. 01/08/2020 120.  Typically, blood glucose is between 90-110  Pt has been previously advised to check blood pressure when she feels "weird". Has not had any recent times where she felt that she needed to take medication.   Pt has ongoing cough and low-grade fevers couple times a week since Reclast infusion.  Discussed with Suzanne Strong (PCP) on 01/05/2020 about it being a possible side effect but mentioned it again today.    Patient obtains medications through Adherence Packaging  30 Days   Last adherence delivery included: Omeprazole 20 mg -1 tablet daily (bedtime) Amitriptyline 50 mg -1 tablet daily (bedtime) Ropinirole 1 mg - tablet once daily (breakfast) and 3 tablets once nightly ( bedtime) Pregabalin 150 mg -1 twice daily (breakfast and bedtime) Alpha lipoic acid 200 mg -1 tablet daily ( bedtime) Janumet XR 50/1000 mg -1 tablet two times a day (breakfast and evening meal), Sertraline 100 mg -2 tablet daily (breakfast) Trazodone 100 mg -1 tablet daily ( bedtime) Xanax 0.5 mg - 1 tablet daily as needed Buspirone 15 mg-  1 tablet twice daily ( breakfast and bedtime)  Atorvastatin 10 mg- 1 tablet daily (breakfast) Albuterol hfa- 2 puffs by mouth every 6 hours as needed  Patient declined: Glipizide 10 mg- 1 tablet twice daily (breakfast and evening meal) due to having extra supply from [facility] # on hand in one bottle and another bottle that has not been opened, patient is  would like to hold for a few months until she finishes medications from [place].  Centrum Silver Multivitamin - 1 tablet daily (breakfast), Vitamin B-Complex -1 tablet daily ( breakfast) and Vitamin D3 50 mcg -1 tablet twice daily (breakfast and bedtime) due to having an adequate supply on hand, will add to packs once finished home supply.    Patient is due for next adherence delivery on:01/19/2020. Called patient and reviewed medications and coordinated delivery.  This delivery to  include:PACKS: . Omeprazole 20 mg - 1 tablet daily (bedtime) . Amitriptyline 50 mg - 1 tablet daily (bedtime) . Ropinirole 1 mg -  tablet once daily (breakfast) and 3 tablets once nightly (bedtime . Pregabalin 150 mg - 1 twice daily (breakfast and bedtime) . Alpha lipoic acid 200 mg - 1 tablet daily (bedtime) . Janumet XR 50/1000 mg - 1 tablet two times a day (breakfast and evening meal) . Sertraline 100 mg - 2 tablets daily (breakfast) . Trazodone 100 mg - 1 tablet daily (bedtime) . Buspirone 15 mg- 1 tablet twice daily (breakfast and bedtime)  . Atorvastatin 10 mg- 1 tablet daily (breakfast) . Pregabalin 150 mg capsule-1 tablet bid (breakfast and bedtime) . Belsomra 20 mg tabs-1 tab daily at bedtime         VIALS: Xanax 0.5 mg - 1 tablet daily as needed   Patient will need a short fill of Belsomra 20 mg tablet- 1 tab prior to adherence delivery. (To align with sync date d)  Coordinated acute fill for Belsomra 20 mg tablet- 1 tab prior  to be delivered 01/10/2020.  Patient declined the following medications Glipizide 10 mg- 1 tablet twice daily (breakfast and evening meal) due to having extra supply from [facility] # on hand in one bottle and another bottle that has not been opened, patient would like to hold for a few months until she finishes medications from [place].  Centrum Silver Multivitamin - 1 tablet daily (breakfast), Vitamin B-Complex -1 tablet daily ( breakfast) and Vitamin D3 50 mcg -1 tablet twice daily (breakfast and bedtime) due to having an adequate supply on hand, will add to packs once finished home supply. Albuterol inhaler due to adequate on hand supply.   Patient needs refills for Belsomra 20 mg.  Confirmed delivery date of 01/19/2020, advised patient that pharmacy will contact them the morning of delivery.  [name] informed.

## 2020-01-08 NOTE — Telephone Encounter (Signed)
-----   Message from [name], MD sent at 01/07/2020  9:05 AM EST ----- Your chest xray looks good - the opacity seen previously is gone

## 2020-01-08 NOTE — Telephone Encounter (Signed)
-----   Message from [name], MD sent at 01/07/2020  1:05 PM EST ----- Insignificant amt of bacteria in urine  If symptoms return please let me know

## 2020-01-08 NOTE — Telephone Encounter (Signed)
Left VM requesting pt to call the office back 

## 2020-01-08 NOTE — Telephone Encounter (Signed)
Pt.notified

## 2020-01-08 NOTE — Telephone Encounter (Signed)
Script sent for 41-day supply of Belsomra 20 mg.

## 2020-01-08 NOTE — Telephone Encounter (Signed)
Last Rx is showing 20 mg Belsomra, not sure about the 15 mg.  Please review

## 2020-01-08 NOTE — Telephone Encounter (Signed)
[facility] called stating provider called in Belsomra 15 mg and Pt is telling pharmacy she takes 20 mg. Pharmacy needs clarification call [phone]. [facility] is asking for either 41 day Rx or 1 Rx for 11 days and I for 30 days. That will take care of this month and next.

## 2020-01-09 ENCOUNTER — Other Ambulatory Visit: Payer: Self-pay | Admitting: Family Medicine

## 2020-01-19 ENCOUNTER — Telehealth: Payer: Self-pay | Admitting: Psychiatry

## 2020-01-19 ENCOUNTER — Other Ambulatory Visit: Payer: Self-pay

## 2020-01-19 DIAGNOSIS — G4700 Insomnia, unspecified: Secondary | ICD-10-CM

## 2020-01-19 MED ORDER — BELSOMRA 20 MG PO TABS: 20.0000 mg | ORAL_TABLET | Freq: Every day | ORAL | 0 refills | Status: DC

## 2020-01-19 NOTE — Telephone Encounter (Signed)
[facility] pharmacy called to request refill of  [facility]'s belsomra.  She said they didn't have the prescription sent 01/08/20. Please send another one.  She said they deliver to the pt and need it this morning in order to get it to her.

## 2020-01-19 NOTE — Telephone Encounter (Signed)
Pended for Janett Billow to resend

## 2020-01-22 ENCOUNTER — Telehealth: Payer: Self-pay

## 2020-01-22 NOTE — Chronic Care Management (AMB) (Signed)
Chronic Care Management Pharmacy Assistant   Name: Suzanne Strong  MRN: [id number] DOB: 1945/06/08  Reason for Encounter: Medication Review   PCP : [name], MD  Allergies:   Allergies  Allergen Reactions   Amoxicillin Swelling    REACTION: rash, swelling Has patient had a PCN reaction causing immediate rash, facial/tongue/throat swelling, SOB or lightheadedness with hypotension: yes Has patient had a PCN reaction causing severe rash involving mucus membranes or skin necrosis: no Has patient had a PCN reaction that required hospitalization: no Has patient had a PCN reaction occurring within the last 10 years: yes If all of the above answers are "NO", then may proceed with Cephalosporin use.    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    amitriptyline (ELAVIL) 50 MG tablet Take 1 tablet (50 mg total) by mouth at bedtime.    atorvastatin (LIPITOR) 10 MG tablet TAKE 1 TABLET(10 MG) BY MOUTH DAILY    b complex vitamins tablet Take 1 tablet by mouth daily.      busPIRone (BUSPAR) 15 MG tablet Take 1 tablet (15 mg total) by mouth 2 (two) times daily.    chlorpheniramine-HYDROcodone (TUSSIONEX PENNKINETIC ER) 10-8 MG/5ML SUER Take 5 mLs by mouth every 12 (twelve) hours as needed for cough. Caution of sedation    Cholecalciferol (VITAMIN D-1000 MAX ST) 25 MCG (1000 UT) tablet Take 2,000 Units by mouth daily.     glipiZIDE (GLUCOTROL) 10 MG tablet Take 10 mg by mouth 2 (two) times daily before a meal.      glucose blood test strip To check blood glucose once daily and prn for diabetes    JANUMET XR 50-1000 MG TB24 Take 1 tablet by mouth 2 (two) times daily.     lidocaine-prilocaine (EMLA) cream Apply 1 application topically as needed. Apply to port when going through Chemo    MELOXICAM PO Take by mouth as needed.    Multiple Vitamin (MULTIVITAMIN) tablet Take 1 tablet by mouth daily.    Multiple Vitamins-Minerals (WOMENS MULTIVITAMIN PO) Take by mouth.    omeprazole (PRILOSEC) 20 MG capsule TAKE 1 CAPSULE(20 MG) BY MOUTH AT BEDTIME    pregabalin (LYRICA) 150 MG capsule Take 1 capsule by mouth 2 (two) times a day. 04/20/2019: Currently taking 1 in the am and 1 at night   rOPINIRole (REQUIP) 1 MG tablet TAKE 1/2 TABLET BY MOUTH EVERY MORNING and TAKE THREE TABLETS BY MOUTH EVERYDAY AT BEDTIME    sertraline (ZOLOFT) 100 MG tablet TAKE 2 TABLETS BY MOUTH EVERY DAY    Suvorexant (BELSOMRA) 20 MG TABS Take 20 mg by mouth at bedtime.    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   Preoperative examination 04/27/2017   Carpal tunnel syndrome of right wrist 11/13/2016   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[place]) History of  02/17/2016   Obstructive sleep apnea 01/01/2016   Carcinoma of upper-outer quadrant of left breast in female, estrogen receptor negative ([place]) 11/21/2015   Hypomagnesemia 08/28/2015   Initial Medicare annual wellness visit 10/16/2014   Estrogen deficiency 10/16/2014    Colon cancer screening 10/16/2014   Controlled type 2 diabetes mellitus without complication ([place]) 71/21/9758   RLS (restless legs syndrome) 02/21/2014   Chronic neck pain 07/25/2010   Depression 07/07/2010   Routine general medical examination at a health care facility 06/26/2010   B12 deficiency 09/04/2009   Anemia, iron deficiency 08/29/2009   Anxiety state 08/26/2009   GASTROPARESIS 08/26/2009   Vitamin D deficiency 07/19/2008   Disorder of bone and cartilage 07/19/2007   Hyperlipidemia 07/18/2007   EDEMA 07/18/2007   Skin lesion, superficial 06/02/2007   Dr. Gabriel Carina at [facility]'s office was contacted to request a refill of Lyrica 150 mg 1 tablet twice daily be sent to [facility]. A transfer was requested from [facility] however, this was never received. Message was sent from front desk to Dr. Joycie Peek nurse.     Follow-Up:  Pharmacist Review   [name], CPP notified  Margaretmary Dys, [place] Pharmacy Assistant [phone]

## 2020-01-24 ENCOUNTER — Telehealth: Payer: Self-pay

## 2020-01-24 NOTE — Chronic Care Management (AMB) (Signed)
Chronic Care Management Pharmacy Assistant   Name: Suzanne Strong  MRN: [id number] DOB: August 29, 1945  Reason for Encounter: Medication Review   PCP : [name], MD  Allergies:   Allergies  Allergen Reactions  . Amoxicillin Swelling    REACTION: rash, swelling Has patient had a PCN reaction causing immediate rash, facial/tongue/throat swelling, SOB or lightheadedness with hypotension: yes Has patient had a PCN reaction causing severe rash involving mucus membranes or skin necrosis: no Has patient had a PCN reaction that required hospitalization: no Has patient had a PCN reaction occurring within the last 10 years: yes If all of the above answers are "NO", then may proceed with Cephalosporin use.   [name] Fluconazole Rash    REACTION: hives  . Difuleron [Ferrous Fumarate-Dss]   . 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 albuterol (VENTOLIN HFA) 108 (90 Base) MCG/ACT inhaler INHALE TWO PUFFS BY MOUTH EVERY SIX HOURS AS NEEDED   . ALPRAZolam (XANAX) 0.5 MG tablet Take 1 tablet (0.5 mg total) by mouth 2 (two) times daily as needed for anxiety or sleep.   [name] amitriptyline (ELAVIL) 50 MG tablet Take 1 tablet (50 mg total) by mouth at bedtime.   [name] atorvastatin (LIPITOR) 10 MG tablet TAKE 1 TABLET(10 MG) BY MOUTH DAILY   . b complex vitamins tablet Take 1 tablet by mouth daily.     . busPIRone (BUSPAR) 15 MG tablet Take 1 tablet (15 mg total) by mouth 2 (two) times daily.   . chlorpheniramine-HYDROcodone (TUSSIONEX PENNKINETIC ER) 10-8 MG/5ML SUER Take 5 mLs by mouth every 12 (twelve) hours as needed for cough. Caution of sedation   . Cholecalciferol (VITAMIN D-1000 MAX ST) 25 MCG (1000 UT) tablet Take 2,000 Units by mouth daily.    [name] glipiZIDE (GLUCOTROL) 10 MG tablet Take 10 mg by mouth 2 (two) times daily before a meal.    .  glucose blood test strip To check blood glucose once daily and prn for diabetes   . JANUMET XR 50-1000 MG TB24 Take 1 tablet by mouth 2 (two) times daily.    [name] lidocaine-prilocaine (EMLA) cream Apply 1 application topically as needed. Apply to port when going through Chemo   . MELOXICAM PO Take by mouth as needed.   . Multiple Vitamin (MULTIVITAMIN) tablet Take 1 tablet by mouth daily.   . Multiple Vitamins-Minerals (WOMENS MULTIVITAMIN PO) Take by mouth.   [name] omeprazole (PRILOSEC) 20 MG capsule TAKE 1 CAPSULE(20 MG) BY MOUTH AT BEDTIME   . pregabalin (LYRICA) 150 MG capsule Take 1 capsule by mouth 2 (two) times a day. 04/20/2019: Currently taking 1 in the am and 1 at night  . rOPINIRole (REQUIP) 1 MG tablet TAKE 1/2 TABLET BY MOUTH EVERY MORNING and TAKE THREE TABLETS BY MOUTH EVERYDAY AT BEDTIME   . sertraline (ZOLOFT) 100 MG tablet TAKE 2 TABLETS BY MOUTH EVERY DAY   . Suvorexant (BELSOMRA) 20 MG TABS Take 20 mg by mouth at bedtime.    Facility-Administered Encounter Medications as of 01/24/2020  Medication  . sodium chloride flush (NS) 0.9 % injection 10 mL    Current Diagnosis: Patient Active Problem List   Diagnosis Date Noted  . Urinary urgency 01/05/2020  . Grief reaction 09/18/2019  . Swallowing dysfunction 07/18/2019  . Cough 02/21/2019  . Fever, intermittent  09/01/2018  . Pre-syncope 02/07/2018  . Orthostatic hypotension 01/26/2018  . Episodic weakness 01/26/2018  . Medicare annual wellness visit, subsequent 06/29/2017  . 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 Preoperative examination 04/27/2017  . Carpal tunnel syndrome of right wrist 11/13/2016  . 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 Obstructive sleep apnea 01/01/2016  . Carcinoma of upper-outer quadrant of left breast in female, estrogen receptor negative ([place]) 11/21/2015  . Hypomagnesemia 08/28/2015  . Initial Medicare annual wellness visit 10/16/2014  . Estrogen deficiency 10/16/2014  .  Colon cancer screening 10/16/2014  . Controlled type 2 diabetes mellitus without complication ([place]) 29/93/7169  . RLS (restless legs syndrome) 02/21/2014  . Chronic neck pain 07/25/2010  . Depression 07/07/2010  . Routine general medical examination at a health care facility 06/26/2010  . B12 deficiency 09/04/2009  . Anemia, iron deficiency 08/29/2009  . Anxiety state 08/26/2009  . GASTROPARESIS 08/26/2009  . Vitamin D deficiency 07/19/2008  . Disorder of bone and cartilage 07/19/2007  . Hyperlipidemia 07/18/2007  . EDEMA 07/18/2007  . Skin lesion, superficial 06/02/2007    Ms. Emberton was contacted regarding her Lyrica. She was informed that [facility] had not yet received the prescription from her provider. She stated that it was on automatic refill at [facility] and she just had it filled. She notes she will need it towards the end of December.   Follow-Up:  Pharmacist Review   [name], CPP notified  Margaretmary Dys, [place] Pharmacy Assistant [phone]

## 2020-02-07 ENCOUNTER — Telehealth: Payer: Self-pay

## 2020-02-07 ENCOUNTER — Telehealth: Payer: Self-pay | Admitting: Psychiatry

## 2020-02-07 DIAGNOSIS — F411 Generalized anxiety disorder: Secondary | ICD-10-CM

## 2020-02-07 MED ORDER — BUSPIRONE HCL 15 MG PO TABS: 15.0000 mg | ORAL_TABLET | Freq: Two times a day (BID) | ORAL | 5 refills | Status: DC

## 2020-02-07 NOTE — Chronic Care Management (AMB) (Addendum)
Chronic Care Management Pharmacy Assistant   Name: Suzanne Strong  MRN: [id number] DOB: May 10, 1945  Reason for Encounter: Medication Review  Patient Questions:  1.  Have you seen any other providers since your last visit? No  2.  Any changes in your medicines or health? No   PCP : Tower, [name], MD  Allergies:   Allergies  Allergen Reactions   Amoxicillin Swelling    REACTION: rash, swelling Has patient had a PCN reaction causing immediate rash, facial/tongue/throat swelling, SOB or lightheadedness with hypotension: yes Has patient had a PCN reaction causing severe rash involving mucus membranes or skin necrosis: no Has patient had a PCN reaction that required hospitalization: no Has patient had a PCN reaction occurring within the last 10 years: yes If all of the above answers are "NO", then may proceed with Cephalosporin use.    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    amitriptyline (ELAVIL) 50 MG tablet Take 1 tablet (50 mg total) by mouth at bedtime.    atorvastatin (LIPITOR) 10 MG tablet TAKE 1 TABLET(10 MG) BY MOUTH DAILY    b complex vitamins tablet Take 1 tablet by mouth daily.      busPIRone (BUSPAR) 15 MG tablet Take 1 tablet (15 mg total) by mouth 2 (two) times daily.    chlorpheniramine-HYDROcodone (TUSSIONEX PENNKINETIC ER) 10-8 MG/5ML SUER Take 5 mLs by mouth every 12 (twelve) hours as needed for cough. Caution of sedation    Cholecalciferol (VITAMIN D-1000 MAX ST) 25 MCG (1000 UT) tablet Take  2,000 Units by mouth daily.     glipiZIDE (GLUCOTROL) 10 MG tablet Take 10 mg by mouth 2 (two) times daily before a meal.     glucose blood test strip To check blood glucose once daily and prn for diabetes    JANUMET XR 50-1000 MG TB24 Take 1 tablet by mouth 2 (two) times daily.     lidocaine-prilocaine (EMLA) cream Apply 1 application topically as needed. Apply to port when going through Chemo    MELOXICAM PO Take by mouth as needed.    Multiple Vitamin (MULTIVITAMIN) tablet Take 1 tablet by mouth daily.    Multiple Vitamins-Minerals (WOMENS MULTIVITAMIN PO) Take by mouth.    omeprazole (PRILOSEC) 20 MG capsule TAKE 1 CAPSULE(20 MG) BY MOUTH AT BEDTIME    pregabalin (LYRICA) 150 MG capsule Take 1 capsule by mouth 2 (two) times a day. 04/20/2019: Currently taking 1 in the am and 1 at night   rOPINIRole (REQUIP) 1 MG tablet TAKE 1/2 TABLET BY MOUTH EVERY MORNING and TAKE THREE TABLETS BY MOUTH EVERYDAY AT BEDTIME    sertraline (ZOLOFT) 100 MG tablet TAKE 2 TABLETS BY MOUTH EVERY DAY    Suvorexant (BELSOMRA) 20 MG TABS Take 20 mg by mouth at bedtime.    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   Preoperative examination 04/27/2017   Carpal tunnel syndrome of right wrist 11/13/2016   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  02/17/2016   Obstructive sleep apnea 01/01/2016   Carcinoma of upper-outer quadrant of left breast in female, estrogen receptor negative ([place]) 11/21/2015   Hypomagnesemia  08/28/2015   Initial Medicare annual wellness visit 10/16/2014   Estrogen deficiency 10/16/2014   Colon cancer screening 10/16/2014   Controlled type 2 diabetes mellitus without complication ([place]) 17/61/6073   RLS (restless legs syndrome) 02/21/2014   Chronic neck pain 07/25/2010   Depression 07/07/2010   Routine general medical examination at a health care facility 06/26/2010   B12 deficiency 09/04/2009   Anemia, iron deficiency 08/29/2009   Anxiety state 08/26/2009   GASTROPARESIS 08/26/2009   Vitamin D deficiency 07/19/2008   Disorder of bone and cartilage 07/19/2007   Hyperlipidemia 07/18/2007   EDEMA 07/18/2007   Skin lesion, superficial 06/02/2007    Reviewed chart for medication changes ahead of medication coordination call.  No OVs, Consults, or hospital visits since last care coordination call/Pharmacist visit.   No medication changes indicated.  BP Readings from Last 3 Encounters:  01/05/20 128/70  11/15/19 112/70  10/20/19 131/78    Patient obtains medications through Adherence Packaging  30 Days   Last adherence delivery included:  Omeprazole 20 mg - 1 tablet daily (bedtime) Amitriptyline 50 mg - 1 tablet daily (bedtime) Ropinirole 1 mg -  tablet once daily (breakfast) and 3 tablets once nightly (bedtime) Pregabalin 150 mg - 1 twice daily (breakfast and bedtime) Alpha lipoic acid 200 mg - 1 tablet daily (bedtime) Janumet XR 50/1000 mg - 1 tablet two times a day (breakfast and evening meal) Sertraline 100 mg - 2 tablets daily (breakfast) Trazodone 100 mg - 1 tablet daily (bedtime) Buspirone 15 mg- 1 tablet twice daily (breakfast and bedtime)  Atorvastatin 10 mg- 1 tablet daily (breakfast) Pregabalin 150 mg capsule-1 tablet bid (breakfast and bedtime) Belsomra 20 mg tabs-1 tab daily at bedtime   VIALS: Xanax 0.5 mg - 1 tablet daily as needed   Patient declined the following medications last month: Glipizide 10 mg- 1 tablet twice daily (breakfast and  evening meal) due to having extra supply from [facility] # on hand in one bottle and another bottle that has not been opened, patient would like to hold for a few months until she finishes medications from [facility].  Centrum Silver Multivitamin - 1 tablet daily (breakfast) OTC Vitamin B-Complex - 1 tablet daily ( breakfast) OTC Vitamin D3 50 mcg - 1 tablet twice daily (breakfast and bedtime) due to having an adequate supply on hand, will add to packs once finished home supply. Albuterol inhaler due to adequate on hand supply.   Patient is due for next adherence delivery on: 02/18/2020 . Called patient and reviewed medications and coordinated delivery.  This delivery to include: Albuterol inhaler - VIALS Alpha lipoic acid 200 mg - 1 tablet daily (bedtime) Alprazolam 0.5 mg - 1 tablet daily as needed- VIALS Amitriptyline 50 mg - 1 tablet daily (bedtime) Atorvastatin 10 mg- 1 tablet daily (breakfast) Belsomra 20 mg tabs-1 tab daily at bedtime Buspirone 15 mg- 1 tablet twice  daily (breakfast and bedtime)  Glipizide 10 mg- 1 tablet twice daily (breakfast and evening meal) Janumet XR 50/1000 mg - 1 tablet two times a day (breakfast and evening meal)  Omeprazole 20 mg - 1 tablet daily (bedtime) OneTouch ultra test strips - use to check blood sugar once daily- VIALS Ropinirole 1 mg -  tablet once daily (breakfast) and 3 tablets once nightly (bedtime) Sertraline 100 mg - 2 tablets daily (breakfast) Trazodone 100 mg - 1 tablet daily (bedtime)  Patient will not need a short or actue fill of any medications, prior to adherence delivery.   Patient declined the following medications: Centrum Silver Multivitamin - 1 tablet daily (breakfast)- OTC patient buys Pregabalin 150 mg - 1 twice daily (breakfast and bedtime) - Gets from [facility] Vitamin B-Complex - 1 tablet daily ( breakfast) Vitamin D3 50 mcg - 1 tablet twice daily (breakfast and bedtime)  Patient needs refills for: Buspirone 15mg  - Requested  02/07/20 - [facility]  Confirmed delivery date of 02/15/2020, advised patient that pharmacy will contact them the morning of delivery.  Follow-Up:  Coordination of Enhanced Pharmacy Services and Pharmacist Review   [name], CPP notified  Margaretmary Dys, [facility] Pharmacy Assistant [phone]  I have reviewed the care management and care coordination activities outlined in this encounter and I am certifying that I agree with the content of this note. No further action required.  [name], PharmD Clinical Pharmacist [facility] at [facility] [phone]

## 2020-02-07 NOTE — Telephone Encounter (Signed)
Pharmacy called requesting a refill for Buspar. Please send to [facility].

## 2020-02-07 NOTE — Telephone Encounter (Signed)
Script sent  

## 2020-02-12 DIAGNOSIS — E119 Type 2 diabetes mellitus without complications: Secondary | ICD-10-CM | POA: Diagnosis not present

## 2020-02-14 ENCOUNTER — Encounter: Payer: Self-pay | Admitting: *Deleted

## 2020-02-14 ENCOUNTER — Other Ambulatory Visit: Payer: Self-pay

## 2020-02-14 ENCOUNTER — Emergency Department: Payer: PPO

## 2020-02-14 DIAGNOSIS — I10 Essential (primary) hypertension: Secondary | ICD-10-CM | POA: Diagnosis present

## 2020-02-14 DIAGNOSIS — R1314 Dysphagia, pharyngoesophageal phase: Secondary | ICD-10-CM | POA: Diagnosis present

## 2020-02-14 DIAGNOSIS — Z79899 Other long term (current) drug therapy: Secondary | ICD-10-CM

## 2020-02-14 DIAGNOSIS — E1140 Type 2 diabetes mellitus with diabetic neuropathy, unspecified: Secondary | ICD-10-CM | POA: Diagnosis present

## 2020-02-14 DIAGNOSIS — Z8249 Family history of ischemic heart disease and other diseases of the circulatory system: Secondary | ICD-10-CM

## 2020-02-14 DIAGNOSIS — Z83438 Family history of other disorder of lipoprotein metabolism and other lipidemia: Secondary | ICD-10-CM

## 2020-02-14 DIAGNOSIS — F419 Anxiety disorder, unspecified: Secondary | ICD-10-CM | POA: Diagnosis not present

## 2020-02-14 DIAGNOSIS — G2581 Restless legs syndrome: Secondary | ICD-10-CM | POA: Diagnosis present

## 2020-02-14 DIAGNOSIS — E785 Hyperlipidemia, unspecified: Secondary | ICD-10-CM | POA: Diagnosis present

## 2020-02-14 DIAGNOSIS — Z87410 Personal history of cervical dysplasia: Secondary | ICD-10-CM

## 2020-02-14 DIAGNOSIS — Z923 Personal history of irradiation: Secondary | ICD-10-CM

## 2020-02-14 DIAGNOSIS — J9601 Acute respiratory failure with hypoxia: Secondary | ICD-10-CM | POA: Diagnosis not present

## 2020-02-14 DIAGNOSIS — I248 Other forms of acute ischemic heart disease: Secondary | ICD-10-CM | POA: Diagnosis present

## 2020-02-14 DIAGNOSIS — R509 Fever, unspecified: Secondary | ICD-10-CM | POA: Diagnosis not present

## 2020-02-14 DIAGNOSIS — G9341 Metabolic encephalopathy: Secondary | ICD-10-CM | POA: Diagnosis not present

## 2020-02-14 DIAGNOSIS — E1165 Type 2 diabetes mellitus with hyperglycemia: Secondary | ICD-10-CM | POA: Diagnosis not present

## 2020-02-14 DIAGNOSIS — Z85818 Personal history of malignant neoplasm of other sites of lip, oral cavity, and pharynx: Secondary | ICD-10-CM | POA: Diagnosis not present

## 2020-02-14 DIAGNOSIS — E119 Type 2 diabetes mellitus without complications: Secondary | ICD-10-CM | POA: Diagnosis not present

## 2020-02-14 DIAGNOSIS — Z7984 Long term (current) use of oral hypoglycemic drugs: Secondary | ICD-10-CM

## 2020-02-14 DIAGNOSIS — R778 Other specified abnormalities of plasma proteins: Secondary | ICD-10-CM | POA: Diagnosis not present

## 2020-02-14 DIAGNOSIS — Z9049 Acquired absence of other specified parts of digestive tract: Secondary | ICD-10-CM | POA: Diagnosis not present

## 2020-02-14 DIAGNOSIS — Z880 Allergy status to penicillin: Secondary | ICD-10-CM | POA: Diagnosis not present

## 2020-02-14 DIAGNOSIS — Z853 Personal history of malignant neoplasm of breast: Secondary | ICD-10-CM

## 2020-02-14 DIAGNOSIS — N179 Acute kidney failure, unspecified: Secondary | ICD-10-CM | POA: Diagnosis present

## 2020-02-14 DIAGNOSIS — Z9841 Cataract extraction status, right eye: Secondary | ICD-10-CM | POA: Diagnosis not present

## 2020-02-14 DIAGNOSIS — F32A Depression, unspecified: Secondary | ICD-10-CM | POA: Diagnosis not present

## 2020-02-14 DIAGNOSIS — K219 Gastro-esophageal reflux disease without esophagitis: Secondary | ICD-10-CM | POA: Diagnosis not present

## 2020-02-14 DIAGNOSIS — J690 Pneumonitis due to inhalation of food and vomit: Secondary | ICD-10-CM | POA: Diagnosis not present

## 2020-02-14 DIAGNOSIS — Z20822 Contact with and (suspected) exposure to covid-19: Secondary | ICD-10-CM | POA: Diagnosis present

## 2020-02-14 DIAGNOSIS — Z961 Presence of intraocular lens: Secondary | ICD-10-CM | POA: Diagnosis present

## 2020-02-14 DIAGNOSIS — Z87891 Personal history of nicotine dependence: Secondary | ICD-10-CM

## 2020-02-14 DIAGNOSIS — R1312 Dysphagia, oropharyngeal phase: Secondary | ICD-10-CM | POA: Diagnosis not present

## 2020-02-14 DIAGNOSIS — Z811 Family history of alcohol abuse and dependence: Secondary | ICD-10-CM

## 2020-02-14 DIAGNOSIS — Z9221 Personal history of antineoplastic chemotherapy: Secondary | ICD-10-CM

## 2020-02-14 DIAGNOSIS — E860 Dehydration: Secondary | ICD-10-CM | POA: Diagnosis present

## 2020-02-14 DIAGNOSIS — Z818 Family history of other mental and behavioral disorders: Secondary | ICD-10-CM

## 2020-02-14 DIAGNOSIS — Z9981 Dependence on supplemental oxygen: Secondary | ICD-10-CM | POA: Diagnosis not present

## 2020-02-14 DIAGNOSIS — R0602 Shortness of breath: Secondary | ICD-10-CM | POA: Diagnosis not present

## 2020-02-14 DIAGNOSIS — R059 Cough, unspecified: Secondary | ICD-10-CM | POA: Diagnosis not present

## 2020-02-14 DIAGNOSIS — Z791 Long term (current) use of non-steroidal anti-inflammatories (NSAID): Secondary | ICD-10-CM

## 2020-02-14 DIAGNOSIS — R0902 Hypoxemia: Secondary | ICD-10-CM | POA: Diagnosis not present

## 2020-02-14 DIAGNOSIS — Z801 Family history of malignant neoplasm of trachea, bronchus and lung: Secondary | ICD-10-CM

## 2020-02-14 DIAGNOSIS — Z9842 Cataract extraction status, left eye: Secondary | ICD-10-CM | POA: Diagnosis not present

## 2020-02-14 DIAGNOSIS — J189 Pneumonia, unspecified organism: Secondary | ICD-10-CM | POA: Diagnosis not present

## 2020-02-14 DIAGNOSIS — R4182 Altered mental status, unspecified: Secondary | ICD-10-CM | POA: Diagnosis not present

## 2020-02-14 DIAGNOSIS — A419 Sepsis, unspecified organism: Secondary | ICD-10-CM | POA: Diagnosis not present

## 2020-02-14 DIAGNOSIS — Z888 Allergy status to other drugs, medicaments and biological substances status: Secondary | ICD-10-CM | POA: Diagnosis not present

## 2020-02-14 DIAGNOSIS — Z8262 Family history of osteoporosis: Secondary | ICD-10-CM

## 2020-02-14 DIAGNOSIS — F418 Other specified anxiety disorders: Secondary | ICD-10-CM | POA: Diagnosis not present

## 2020-02-14 LAB — BASIC METABOLIC PANEL
Anion gap: 11 (ref 5–15)
BUN: 23 mg/dL (ref 8–23)
CO2: 24 mmol/L (ref 22–32)
Calcium: 9.7 mg/dL (ref 8.9–10.3)
Chloride: 101 mmol/L (ref 98–111)
Creatinine, Ser: 1.16 mg/dL — ABNORMAL HIGH (ref 0.44–1.00)
GFR, Estimated: 50 mL/min — ABNORMAL LOW (ref >60)
Glucose, Bld: 133 mg/dL — ABNORMAL HIGH (ref 70–99)
Potassium: 4.2 mmol/L (ref 3.5–5.1)
Sodium: 136 mmol/L (ref 135–145)

## 2020-02-14 LAB — CBC
HCT: 33.8 % — ABNORMAL LOW (ref 36.0–46.0)
Hemoglobin: 10.8 g/dL — ABNORMAL LOW (ref 12.0–15.0)
MCH: 27.9 pg (ref 26.0–34.0)
MCHC: 32 g/dL (ref 30.0–36.0)
MCV: 87.3 fL (ref 80.0–100.0)
Platelets: 209 10*3/uL (ref 150–400)
RBC: 3.87 MIL/uL (ref 3.87–5.11)
RDW: 14.4 % (ref 11.5–15.5)
WBC: 11.7 10*3/uL — ABNORMAL HIGH (ref 4.0–10.5)
nRBC: 0 % (ref 0.0–0.2)

## 2020-02-14 LAB — CBG MONITORING, ED: Glucose-Capillary: 124 mg/dL — ABNORMAL HIGH (ref 70–99)

## 2020-02-14 LAB — POC SARS CORONAVIRUS 2 AG -  ED: SARS Coronavirus 2 Ag: NEGATIVE

## 2020-02-14 LAB — RESP PANEL BY RT-PCR (FLU A&B, COVID) ARPGX2
Influenza A by PCR: NEGATIVE
Influenza B by PCR: NEGATIVE
SARS Coronavirus 2 by RT PCR: NEGATIVE

## 2020-02-14 NOTE — ED Triage Notes (Signed)
Pt placed on 2 liters oxygen, saturations mid 90's

## 2020-02-14 NOTE — ED Triage Notes (Signed)
Fever (100.9), cough, shortness of breath since yesterday. Said she was feeling confused today. Weakness. Saturations 85-88% on RA in triage.

## 2020-02-15 ENCOUNTER — Inpatient Hospital Stay
Admission: EM | Admit: 2020-02-15 | Discharge: 2020-02-17 | DRG: 177 | Disposition: A | Discharge status: Home / Self Care | Payer: PPO | Attending: Internal Medicine | Admitting: Internal Medicine

## 2020-02-15 ENCOUNTER — Inpatient Hospital Stay: Payer: PPO

## 2020-02-15 DIAGNOSIS — F411 Generalized anxiety disorder: Secondary | ICD-10-CM

## 2020-02-15 DIAGNOSIS — N179 Acute kidney failure, unspecified: Secondary | ICD-10-CM | POA: Diagnosis not present

## 2020-02-15 DIAGNOSIS — E119 Type 2 diabetes mellitus without complications: Secondary | ICD-10-CM

## 2020-02-15 DIAGNOSIS — R4182 Altered mental status, unspecified: Secondary | ICD-10-CM | POA: Diagnosis not present

## 2020-02-15 DIAGNOSIS — F32A Depression, unspecified: Secondary | ICD-10-CM | POA: Diagnosis present

## 2020-02-15 DIAGNOSIS — Z9842 Cataract extraction status, left eye: Secondary | ICD-10-CM | POA: Diagnosis not present

## 2020-02-15 DIAGNOSIS — E1140 Type 2 diabetes mellitus with diabetic neuropathy, unspecified: Secondary | ICD-10-CM | POA: Diagnosis present

## 2020-02-15 DIAGNOSIS — Z8701 Personal history of pneumonia (recurrent): Secondary | ICD-10-CM | POA: Diagnosis present

## 2020-02-15 DIAGNOSIS — R778 Other specified abnormalities of plasma proteins: Secondary | ICD-10-CM | POA: Diagnosis not present

## 2020-02-15 DIAGNOSIS — K219 Gastro-esophageal reflux disease without esophagitis: Secondary | ICD-10-CM | POA: Diagnosis not present

## 2020-02-15 DIAGNOSIS — J9601 Acute respiratory failure with hypoxia: Secondary | ICD-10-CM | POA: Diagnosis not present

## 2020-02-15 DIAGNOSIS — F418 Other specified anxiety disorders: Secondary | ICD-10-CM | POA: Diagnosis present

## 2020-02-15 DIAGNOSIS — Z853 Personal history of malignant neoplasm of breast: Secondary | ICD-10-CM | POA: Diagnosis not present

## 2020-02-15 DIAGNOSIS — Z961 Presence of intraocular lens: Secondary | ICD-10-CM | POA: Diagnosis present

## 2020-02-15 DIAGNOSIS — R1314 Dysphagia, pharyngoesophageal phase: Secondary | ICD-10-CM | POA: Diagnosis present

## 2020-02-15 DIAGNOSIS — I10 Essential (primary) hypertension: Secondary | ICD-10-CM | POA: Diagnosis present

## 2020-02-15 DIAGNOSIS — Z923 Personal history of irradiation: Secondary | ICD-10-CM | POA: Diagnosis not present

## 2020-02-15 DIAGNOSIS — Z20822 Contact with and (suspected) exposure to covid-19: Secondary | ICD-10-CM | POA: Diagnosis present

## 2020-02-15 DIAGNOSIS — G2581 Restless legs syndrome: Secondary | ICD-10-CM | POA: Diagnosis present

## 2020-02-15 DIAGNOSIS — Z9049 Acquired absence of other specified parts of digestive tract: Secondary | ICD-10-CM | POA: Diagnosis not present

## 2020-02-15 DIAGNOSIS — A419 Sepsis, unspecified organism: Secondary | ICD-10-CM | POA: Insufficient documentation

## 2020-02-15 DIAGNOSIS — G9341 Metabolic encephalopathy: Secondary | ICD-10-CM | POA: Diagnosis present

## 2020-02-15 DIAGNOSIS — E1165 Type 2 diabetes mellitus with hyperglycemia: Secondary | ICD-10-CM | POA: Diagnosis present

## 2020-02-15 DIAGNOSIS — Z880 Allergy status to penicillin: Secondary | ICD-10-CM | POA: Diagnosis not present

## 2020-02-15 DIAGNOSIS — E785 Hyperlipidemia, unspecified: Secondary | ICD-10-CM | POA: Diagnosis present

## 2020-02-15 DIAGNOSIS — I248 Other forms of acute ischemic heart disease: Secondary | ICD-10-CM | POA: Diagnosis present

## 2020-02-15 DIAGNOSIS — Z888 Allergy status to other drugs, medicaments and biological substances status: Secondary | ICD-10-CM | POA: Diagnosis not present

## 2020-02-15 DIAGNOSIS — J690 Pneumonitis due to inhalation of food and vomit: Principal | ICD-10-CM

## 2020-02-15 DIAGNOSIS — Z9841 Cataract extraction status, right eye: Secondary | ICD-10-CM | POA: Diagnosis not present

## 2020-02-15 DIAGNOSIS — F419 Anxiety disorder, unspecified: Secondary | ICD-10-CM | POA: Diagnosis present

## 2020-02-15 DIAGNOSIS — J189 Pneumonia, unspecified organism: Secondary | ICD-10-CM | POA: Insufficient documentation

## 2020-02-15 DIAGNOSIS — Z9221 Personal history of antineoplastic chemotherapy: Secondary | ICD-10-CM | POA: Diagnosis not present

## 2020-02-15 LAB — URINALYSIS, COMPLETE (UACMP) WITH MICROSCOPIC
Bilirubin Urine: NEGATIVE
Glucose, UA: NEGATIVE mg/dL
Hgb urine dipstick: NEGATIVE
Ketones, ur: NEGATIVE mg/dL
Nitrite: NEGATIVE
Protein, ur: NEGATIVE mg/dL
Specific Gravity, Urine: 1.005 (ref 1.005–1.030)
Squamous Epithelial / HPF: NONE SEEN (ref 0–5)
pH: 5 (ref 5.0–8.0)

## 2020-02-15 LAB — GLUCOSE, CAPILLARY
Glucose-Capillary: 91 mg/dL (ref 70–99)
Glucose-Capillary: 128 mg/dL — ABNORMAL HIGH (ref 70–99)
Glucose-Capillary: 111 mg/dL — ABNORMAL HIGH (ref 70–99)

## 2020-02-15 LAB — HIV ANTIBODY (ROUTINE TESTING W REFLEX): HIV Screen 4th Generation wRfx: NONREACTIVE

## 2020-02-15 LAB — TROPONIN I (HIGH SENSITIVITY)
Troponin I (High Sensitivity): 13 ng/L (ref <18)
Troponin I (High Sensitivity): 14 ng/L (ref <18)
Troponin I (High Sensitivity): 18 ng/L — ABNORMAL HIGH (ref <18)
Troponin I (High Sensitivity): 18 ng/L — ABNORMAL HIGH (ref <18)
Troponin I (High Sensitivity): 19 ng/L — ABNORMAL HIGH (ref <18)

## 2020-02-15 LAB — PROCALCITONIN: Procalcitonin: 1.81 ng/mL

## 2020-02-15 LAB — LACTIC ACID, PLASMA
Lactic Acid, Venous: 1.4 mmol/L (ref 0.5–1.9)
Lactic Acid, Venous: 1.8 mmol/L (ref 0.5–1.9)

## 2020-02-15 LAB — HEMOGLOBIN A1C
Hgb A1c MFr Bld: 6.1 % — ABNORMAL HIGH (ref 4.8–5.6)
Mean Plasma Glucose: 128.37 mg/dL

## 2020-02-15 LAB — STREP PNEUMONIAE URINARY ANTIGEN: Strep Pneumo Urinary Antigen: NEGATIVE

## 2020-02-15 MED ORDER — VITAMIN D 25 MCG (1000 UNIT) PO TABS
2000.0000 [IU] | ORAL_TABLET | Freq: Every day | ORAL | Status: DC
  Administered 2020-02-15 – 2020-02-17 (×3): 2000 [IU] via ORAL
  Filled 2020-02-15 (×3): qty 2

## 2020-02-15 MED ORDER — INSULIN ASPART 100 UNIT/ML ~~LOC~~ SOLN
0.0000 [IU] | Freq: Three times a day (TID) | SUBCUTANEOUS | Status: DC
  Administered 2020-02-15 – 2020-02-16 (×2): 1 [IU] via SUBCUTANEOUS
  Administered 2020-02-16: 2 [IU] via SUBCUTANEOUS
  Administered 2020-02-17: 08:00:00 1 [IU] via SUBCUTANEOUS
  Filled 2020-02-15 (×4): qty 1

## 2020-02-15 MED ORDER — ASPIRIN EC 81 MG PO TBEC
81.0000 mg | DELAYED_RELEASE_TABLET | Freq: Every day | ORAL | Status: DC
  Administered 2020-02-15 – 2020-02-17 (×3): 81 mg via ORAL
  Filled 2020-02-15 (×3): qty 1

## 2020-02-15 MED ORDER — ACETAMINOPHEN 500 MG PO TABS
1000.0000 mg | ORAL_TABLET | Freq: Once | ORAL | Status: AC
  Administered 2020-02-15: 06:00:00 1000 mg via ORAL
  Filled 2020-02-15: qty 2

## 2020-02-15 MED ORDER — LACTATED RINGERS IV BOLUS
1000.0000 mL | Freq: Once | INTRAVENOUS | Status: AC
  Administered 2020-02-15: 07:00:00 1000 mL via INTRAVENOUS

## 2020-02-15 MED ORDER — INSULIN ASPART 100 UNIT/ML ~~LOC~~ SOLN: 0.0000 [IU] | Freq: Every day | SUBCUTANEOUS | Status: DC

## 2020-02-15 MED ORDER — IPRATROPIUM-ALBUTEROL 0.5-2.5 (3) MG/3ML IN SOLN
3.0000 mL | Freq: Four times a day (QID) | RESPIRATORY_TRACT | Status: DC
  Administered 2020-02-15 – 2020-02-17 (×7): 3 mL via RESPIRATORY_TRACT
  Filled 2020-02-15 (×7): qty 3

## 2020-02-15 MED ORDER — METRONIDAZOLE 500 MG PO TABS: 500.0000 mg | ORAL_TABLET | Freq: Three times a day (TID) | ORAL | Status: DC

## 2020-02-15 MED ORDER — ONDANSETRON HCL 4 MG/2ML IJ SOLN: 4.0000 mg | Freq: Three times a day (TID) | INTRAMUSCULAR | Status: DC | PRN

## 2020-02-15 MED ORDER — ENOXAPARIN SODIUM 40 MG/0.4ML ~~LOC~~ SOLN
40.0000 mg | SUBCUTANEOUS | Status: DC
  Administered 2020-02-15: 10:00:00 40 mg via SUBCUTANEOUS

## 2020-02-15 MED ORDER — BUSPIRONE HCL 15 MG PO TABS: 15.0000 mg | ORAL_TABLET | Freq: Two times a day (BID) | ORAL | Status: DC

## 2020-02-15 MED ORDER — ALPRAZOLAM 0.5 MG PO TABS: 0.5000 mg | ORAL_TABLET | Freq: Two times a day (BID) | ORAL | Status: DC | PRN

## 2020-02-15 MED ORDER — ACETAMINOPHEN 650 MG RE SUPP: 650.0000 mg | Freq: Four times a day (QID) | RECTAL | Status: DC | PRN

## 2020-02-15 MED ORDER — PREGABALIN 75 MG PO CAPS: 150.0000 mg | ORAL_CAPSULE | Freq: Two times a day (BID) | ORAL | Status: DC

## 2020-02-15 MED ORDER — ALBUTEROL SULFATE (2.5 MG/3ML) 0.083% IN NEBU: 2.5000 mg | INHALATION_SOLUTION | RESPIRATORY_TRACT | Status: DC | PRN

## 2020-02-15 MED ORDER — SODIUM CHLORIDE 0.9 % IV SOLN
1.0000 g | Freq: Once | INTRAVENOUS | Status: AC
  Administered 2020-02-16: 06:00:00 1 g via INTRAVENOUS
  Filled 2020-02-15: qty 1

## 2020-02-15 MED ORDER — ENOXAPARIN SODIUM 60 MG/0.6ML ~~LOC~~ SOLN
0.5000 mg/kg | SUBCUTANEOUS | Status: DC
  Administered 2020-02-15 – 2020-02-16 (×2): 50 mg via SUBCUTANEOUS
  Filled 2020-02-15 (×2): qty 0.6

## 2020-02-15 MED ORDER — DM-GUAIFENESIN ER 30-600 MG PO TB12: 1.0000 | ORAL_TABLET | Freq: Two times a day (BID) | ORAL | Status: DC | PRN

## 2020-02-15 MED ORDER — AZITHROMYCIN 500 MG PO TABS
500.0000 mg | ORAL_TABLET | Freq: Every day | ORAL | Status: DC
  Administered 2020-02-16: 500 mg via ORAL
  Filled 2020-02-15: qty 1

## 2020-02-15 MED ORDER — PANTOPRAZOLE SODIUM 40 MG PO TBEC
40.0000 mg | DELAYED_RELEASE_TABLET | Freq: Every day | ORAL | Status: DC
  Administered 2020-02-15 – 2020-02-17 (×3): 40 mg via ORAL
  Filled 2020-02-15 (×3): qty 1

## 2020-02-15 MED ORDER — ATORVASTATIN CALCIUM 20 MG PO TABS
10.0000 mg | ORAL_TABLET | Freq: Every day | ORAL | Status: DC
  Administered 2020-02-15 – 2020-02-17 (×3): 10 mg via ORAL
  Filled 2020-02-15 (×3): qty 1

## 2020-02-15 MED ORDER — ADULT MULTIVITAMIN W/MINERALS CH
1.0000 | ORAL_TABLET | Freq: Every day | ORAL | Status: DC
  Administered 2020-02-15 – 2020-02-17 (×3): 1 via ORAL
  Filled 2020-02-15 (×3): qty 1

## 2020-02-15 MED ORDER — AZITHROMYCIN 500 MG PO TABS
500.0000 mg | ORAL_TABLET | Freq: Once | ORAL | Status: AC
  Administered 2020-02-15: 06:00:00 500 mg via ORAL
  Filled 2020-02-15: qty 1

## 2020-02-15 MED ORDER — METRONIDAZOLE IN NACL 5-0.79 MG/ML-% IV SOLN
500.0000 mg | Freq: Three times a day (TID) | INTRAVENOUS | Status: DC
  Administered 2020-02-15 – 2020-02-16 (×2): 500 mg via INTRAVENOUS
  Filled 2020-02-15 (×5): qty 100

## 2020-02-15 MED ORDER — TRAZODONE HCL 50 MG PO TABS: 100.0000 mg | ORAL_TABLET | Freq: Every day | ORAL | Status: DC

## 2020-02-15 MED ORDER — ROPINIROLE HCL 1 MG PO TABS: 0.5000 mg | ORAL_TABLET | Freq: Two times a day (BID) | ORAL | Status: DC

## 2020-02-15 MED ORDER — AMITRIPTYLINE HCL 25 MG PO TABS: 50.0000 mg | ORAL_TABLET | Freq: Every day | ORAL | Status: DC

## 2020-02-15 MED ORDER — HYDRALAZINE HCL 20 MG/ML IJ SOLN: 5.0000 mg | INTRAMUSCULAR | Status: DC | PRN

## 2020-02-15 MED ORDER — ACETAMINOPHEN 325 MG PO TABS: 650.0000 mg | ORAL_TABLET | Freq: Four times a day (QID) | ORAL | Status: DC | PRN

## 2020-02-15 MED ORDER — RENA-VITE PO TABS
1.0000 | ORAL_TABLET | Freq: Every day | ORAL | Status: DC
  Administered 2020-02-15 – 2020-02-17 (×3): 1 via ORAL
  Filled 2020-02-15 (×3): qty 1

## 2020-02-15 MED ORDER — SODIUM CHLORIDE 0.9 % IV SOLN
1.0000 g | Freq: Once | INTRAVENOUS | Status: AC
  Administered 2020-02-15: 06:00:00 1 g via INTRAVENOUS
  Filled 2020-02-15: qty 10

## 2020-02-15 MED ORDER — METRONIDAZOLE 500 MG PO TABS
500.0000 mg | ORAL_TABLET | Freq: Once | ORAL | Status: AC
  Administered 2020-02-15: 07:00:00 500 mg via ORAL
  Filled 2020-02-15: qty 1

## 2020-02-15 MED ORDER — SERTRALINE HCL 50 MG PO TABS: 200.0000 mg | ORAL_TABLET | Freq: Every day | ORAL | Status: DC

## 2020-02-15 MED ORDER — SUVOREXANT 20 MG PO TABS: 20.0000 mg | ORAL_TABLET | Freq: Every day | ORAL | Status: DC

## 2020-02-15 NOTE — Progress Notes (Signed)
CODE SEPSIS - PHARMACY COMMUNICATION  **Broad Spectrum Antibiotics should be administered within 1 hour of Sepsis diagnosis**  Time Code Sepsis Called/Page Received: 0601  Antibiotics Ordered: Azithromycin, Ceftriaxone, Flagyl  Time of 1st antibiotic administration: 0603  Otelia Sergeant, PharmD, [facility] 02/15/2020 6:15 AM

## 2020-02-15 NOTE — ED Notes (Signed)
First set of blood cultures obtained previous to first abx. After first abx started, this RN saw order change to two set of blood cultures. Rocephin paused and second set of blood cultures collected.

## 2020-02-15 NOTE — ED Notes (Addendum)
First set of blood cultures obtained at this time.

## 2020-02-15 NOTE — Consult Note (Deleted)
CODE SEPSIS - PHARMACY COMMUNICATION  **Broad Spectrum Antibiotics should be administered within 1 hour of Sepsis diagnosis**  Time Code Sepsis Called/Page Received: 6629  Antibiotics Ordered: ceftriaxone 1 g, azithromycin 500 mg, metronidazole 500 mg  Time of 1st antibiotic administration: 0603  Additional action taken by pharmacy: none  If necessary, Name of Provider/Nurse Contacted: n/a    Sharen Hones ,PharmD,BCPS Clinical Pharmacist  02/15/2020  11:17 AM

## 2020-02-15 NOTE — ED Notes (Signed)
Oxygen sat is 88-89%. She is on 2 liters [place] and it is working.   Increased it to 3 liters and sat came up to 90-91%.  Instructed to hold her arm straight so IVF will run in faster.  Family member at bedside and is fanning patient.

## 2020-02-15 NOTE — ED Provider Notes (Signed)
[facility] Emergency Department Provider Note  ____________________________________________  Time seen: Approximately 5:50 AM  I have reviewed the triage vital signs and the nursing notes.   HISTORY  Chief Complaint Shortness of Breath   HPI Suzanne Strong is a 74 y.o. 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 Patient reports that she has been feeling sick for the last 4 to 5 days. Fevers at home. Started feeling short of breath yesterday. Has had a cough but she is not sure if this is different than her chronic cough. Has been feeling extremely fatigued and weak with difficulty walking today. Has had some diarrhea but no vomiting. No sore throat, no body aches, no abdominal pain, no chest pain. She is complaining of sore upper back pain that started while in the waiting room. No personal or family history of PE or DVT, no recent travel immobilization, no leg pain or swelling, no hemoptysis, no exogenous hormones. Patient denies any known exposures to Covid. She is fully vaccinated.  Past Medical History:  Diagnosis Date  . Anemia    iron deficiency and B12 deficiency  . Anxiety   . Breast cancer ([place]) 02/25/2015   upper-outer quadrant of left female breast, Triple Negative. Neo-adjuvant chemo with complete pathologic response.   . Cervical dysplasia    conization  . Diabetes mellitus without complication ([place])   . Diverticulosis   . Fever 04/11/2015  . Gastroparesis    related to previous radiation tx  . GERD (gastroesophageal reflux disease)   . Hyperlipidemia   . Hypertension   . Neuropathy    legs/feet, S/P chemo meds  . Personal history of chemotherapy   . Personal history of radiation therapy 2017   LEFT lumpectomy w/ radiation  . RLS (restless legs  syndrome)   . Shingles    Hx of  . 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 Wears dentures    partial bottom    Patient Active Problem List   Diagnosis Date Noted  . Urinary urgency 01/05/2020  . Grief reaction 09/18/2019  . Swallowing dysfunction 07/18/2019  . Cough 02/21/2019  . Fever, intermittent 09/01/2018  . Pre-syncope 02/07/2018  . Orthostatic hypotension 01/26/2018  . Episodic weakness 01/26/2018  . Medicare annual wellness visit, subsequent 06/29/2017  . 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 Preoperative examination 04/27/2017  . Carpal tunnel syndrome of right wrist 11/13/2016  . 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[place]) History of  02/17/2016  . Obstructive sleep apnea 01/01/2016  . Carcinoma of upper-outer quadrant of left breast in female, estrogen receptor negative ([place]) 11/21/2015  . Hypomagnesemia 08/28/2015  . Initial Medicare annual wellness visit 10/16/2014  . Estrogen deficiency 10/16/2014  . Colon cancer screening 10/16/2014  . Controlled type 2 diabetes mellitus without complication ([place]) 13/24/4010  . RLS (restless legs syndrome) 02/21/2014  . Chronic neck pain 07/25/2010  . Depression 07/07/2010  . Routine general medical examination at a health care facility 06/26/2010  . B12 deficiency 09/04/2009  . Anemia, iron deficiency 08/29/2009  . Anxiety state 08/26/2009  . GASTROPARESIS 08/26/2009  . Vitamin D deficiency 07/19/2008  . Disorder of bone and cartilage 07/19/2007  . Hyperlipidemia 07/18/2007  . EDEMA 07/18/2007  . Skin lesion, superficial 06/02/2007    Past Surgical History:  Procedure Laterality Date  . APPENDECTOMY    . BREAST BIOPSY Left  02/25/2015   INVASIVE MAMMARY CARCINOMA,Triple negative.  [name] BREAST CYST ASPIRATION    . BREAST EXCISIONAL BIOPSY Left 12/2015   surgery  . BREAST LUMPECTOMY WITH NEEDLE LOCALIZATION Left 10/02/2015   Procedure: BREAST LUMPECTOMY WITH NEEDLE LOCALIZATION;  Surgeon: Earline Mayotte, MD;  Location: [facility] ORS;   Service: General;  Laterality: Left;  . BREAST LUMPECTOMY WITH SENTINEL LYMPH NODE BIOPSY Left 10/02/2015   Procedure: LEFT BREAST WIDE EXCISION WITH SENTINEL LYMPH NODE BX;  Surgeon: Earline Mayotte, MD;  Location: [facility] OR[facility];  Service: General;  Laterality: Left;  . BREAST SURGERY     breast biopsy benign  . CARPAL TUNNEL RELEASE Right 05/19/2017   Procedure: CARPAL TUNNEL RELEASE;  Surgeon: Deeann Saint, MD;  Location: [facility] OR[facility];  Service: Orthopedics;  Laterality: Right;  . CATARACT EXTRACTION W/PHACO Right 10/26/2018   Procedure: CATARACT EXTRACTION PHACO AND INTRAOCULAR LENS PLACEMENT (IOC) right diabetic;  Surgeon: Lockie Mola, MD;  Location: [facility];  Service: Ophthalmology;  Laterality: Right;  Diabetic - oral meds cancer center been notified and will come  . CATARACT EXTRACTION W/PHACO Left 11/16/2018   Procedure: CATARACT EXTRACTION PHACO AND INTRAOCULAR LENS PLACEMENT (IOC) LEFT DIABETIC  01:01.0  17.0%  10.37;  Surgeon: Lockie Mola, MD;  Location: [facility];  Service: Ophthalmology;  Laterality: Left;  Diabetic - oral meds Port-a-cath  . CHOLECYSTECTOMY    . Cologuard  07/22/2016   Negative  . ESOPHAGOGASTRODUODENOSCOPY  07/2009   normal with few gastric polyps  . MOLE REMOVAL    . PORT-A-CATH REMOVAL    . PORTACATH PLACEMENT    . PORTACATH PLACEMENT Right 03/12/2015   Procedure: INSERTION PORT-A-CATH;  Surgeon: Earline Mayotte, MD;  Location: [facility];  Service: General;  Laterality: Right;  . RADICAL NECK DISSECTION    . TONSILLECTOMY     cancer treated with chemo and radiation  . TRIGGER FINGER RELEASE Right 05/19/2017   Procedure: RELEASE TRIGGER FINGER/A-1 PULLEY ring finger;  Surgeon: Deeann Saint, MD;  Location: [facility];  Service: Orthopedics;  Laterality: Right;    Prior to Admission medications   Medication Sig Start Date End Date Taking? Authorizing Provider  albuterol (VENTOLIN HFA) 108 (90 Base) MCG/ACT inhaler INHALE TWO  PUFFS BY MOUTH EVERY SIX HOURS AS NEEDED 01/09/20   Tower, [name], MD  ALPRAZolam Prudy Feeler) 0.5 MG tablet Take 1 tablet (0.5 mg total) by mouth 2 (two) times daily as needed for anxiety or sleep. 09/20/19   Corie Chiquito, PMHNP  amitriptyline (ELAVIL) 50 MG tablet Take 1 tablet (50 mg total) by mouth at bedtime. 12/07/16   Earna Coder, MD  atorvastatin (LIPITOR) 10 MG tablet TAKE 1 TABLET(10 MG) BY MOUTH DAILY 06/02/19   Tower, [name], MD  b complex vitamins tablet Take 1 tablet by mouth daily.      [provider]  busPIRone (BUSPAR) 15 MG tablet Take 1 tablet (15 mg total) by mouth 2 (two) times daily. 02/07/20 03/08/20  Corie Chiquito, PMHNP  chlorpheniramine-HYDROcodone (TUSSIONEX PENNKINETIC ER) 10-8 MG/5ML SUER Take 5 mLs by mouth every 12 (twelve) hours as needed for cough. Caution of sedation 12/22/19   Tower, [name], MD  Cholecalciferol (VITAMIN D-1000 MAX ST) 25 MCG (1000 UT) tablet Take 2,000 Units by mouth daily.     [provider]  glipiZIDE (GLUCOTROL) 10 MG tablet Take 10 mg by mouth 2 (two) times daily before a meal.  03/13/15   [provider]  glucose blood test strip To  check blood glucose once daily and prn for diabetes 10/20/19   Tower, [name], MD  JANUMET XR 50-1000 MG TB24 Take 1 tablet by mouth 2 (two) times daily.  03/13/15   [provider]  lidocaine-prilocaine (EMLA) cream Apply 1 application topically as needed. Apply to port when going through Chemo    [provider]  MELOXICAM PO Take by mouth as needed.    [provider]  Multiple Vitamin (MULTIVITAMIN) tablet Take 1 tablet by mouth daily.    [provider]  Multiple Vitamins-Minerals (WOMENS MULTIVITAMIN PO) Take by mouth.    [provider]  omeprazole (PRILOSEC) 20 MG capsule TAKE 1 CAPSULE(20 MG) BY MOUTH AT BEDTIME 06/02/19   Tower, Kennedy A, MD  pregabalin (LYRICA) 150 MG capsule Take 1 capsule by mouth 2 (two) times a day. 07/26/18  07/26/19  [provider]  rOPINIRole (REQUIP) 1 MG tablet TAKE 1/2 TABLET BY MOUTH EVERY MORNING and TAKE THREE TABLETS BY MOUTH EVERYDAY AT BEDTIME 12/21/19   Tower, Marne A, MD  sertraline (ZOLOFT) 100 MG tablet TAKE 2 TABLETS BY MOUTH EVERY DAY 09/20/19   Corie Chiquito, PMHNP  Suvorexant (BELSOMRA) 20 MG TABS Take 20 mg by mouth at bedtime. 01/19/20 02/18/20  Corie Chiquito, PMHNP    Allergies Amoxicillin, Fluconazole, Difuleron [ferrous fumarate-dss], and Other  Family History  Problem Relation Age of Onset  . Osteoporosis Mother   . Hyperlipidemia Mother   . Depression Mother   . Cancer Mother        skin cancer ? basal cell and lung ca heavy smoker  . Alcohol abuse Father   . Cancer Father        skin CA ? basal cell  . Heart disease Father        CHF  . Hyperlipidemia Brother   . Hypertension Brother   . Breast cancer Neg Hx     Social History Social History   Tobacco Use  . Smoking status: Former Smoker    Packs/day: 0.50    Years: 6.00    Pack years: 3.00    Types: Cigarettes    Quit date: 03/10/1984    Years since quitting: 35.9  . Smokeless tobacco: Never Used  Vaping Use  . Vaping Use: Never used  Substance Use Topics  . Alcohol use: No    Alcohol/week: 0.0 standard drinks  . Drug use: No    Review of Systems  Constitutional: + fever, fatigue Eyes: Negative for visual changes. ENT: Negative for sore throat. Neck: No neck pain  Cardiovascular: Negative for chest pain. Respiratory: + shortness of breath, cough Gastrointestinal: Negative for abdominal pain, vomiting. + diarrhea. Genitourinary: Negative for dysuria. Musculoskeletal: + upper back pain. Skin: Negative for rash. Neurological: Negative for headaches, weakness or numbness. Psych: No SI or HI  ____________________________________________   PHYSICAL EXAM:  VITAL SIGNS: Vitals:   02/14/20 2347 02/15/20 0129  BP: (!) 125/47 (!) 129/47  Pulse: 91 90  Resp: 19 20  Temp: 99.7 F  (37.6 C) 100 F (37.8 C)  SpO2: 98% 95%    Constitutional: Alert and oriented. Well appearing and in no apparent distress. HEENT:      Head: Normocephalic and atraumatic.         Eyes: Conjunctivae are normal. Sclera is non-icteric.       Mouth/Throat: Mucous membranes are moist.       Neck: Supple with no signs of meningismus. Cardiovascular: Regular rate and rhythm. No murmurs, gallops, or  rubs. 2+ symmetrical distal pulses are present in all extremities.  Respiratory: Normal respiratory effort. Hypoxic to 86% RA which improved on 2 L [place]. No wheezing or crackles Gastrointestinal: Soft, non tender, and non distended. Musculoskeletal: Nontender with normal range of motion in all extremities. No edema, cyanosis, or erythema of extremities. Neurologic: Normal speech and language. Face is symmetric. Moving all extremities. No gross focal neurologic deficits are appreciated. Skin: Skin is warm, dry and intact. No rash noted. Psychiatric: Mood and affect are normal. Speech and behavior are normal.  ____________________________________________   LABS (all labs ordered are listed, but only abnormal results are displayed)  Labs Reviewed  BASIC METABOLIC PANEL - Abnormal; Notable for the following components:      Result Value   Glucose, Bld 133 (*)    Creatinine, Ser 1.16 (*)    GFR, Estimated 50 (*)    All other components within normal limits  CBC - Abnormal; Notable for the following components:   WBC 11.7 (*)    Hemoglobin 10.8 (*)    HCT 33.8 (*)    All other components within normal limits  CBG MONITORING, ED - Abnormal; Notable for the following components:   Glucose-Capillary 124 (*)    All other components within normal limits  TROPONIN I (HIGH SENSITIVITY) - Abnormal; Notable for the following components:   Troponin I (High Sensitivity) 19 (*)    All other components within normal limits  TROPONIN I (HIGH SENSITIVITY) - Abnormal; Notable for the following components:    Troponin I (High Sensitivity) 18 (*)    All other components within normal limits  RESP PANEL BY RT-PCR (FLU A&B, COVID) ARPGX2  CULTURE, BLOOD (ROUTINE X 2)  CULTURE, BLOOD (ROUTINE X 2)  PROCALCITONIN  LACTIC ACID, PLASMA  LACTIC ACID, PLASMA  POC SARS CORONAVIRUS 2 AG -  ED   ____________________________________________  EKG  ED ECG REPORT I, Nita Sickle, the attending physician, personally viewed and interpreted this ECG.  Sinus tachycardia, rate of 108, occasional PVCs, normal axis, normal intervals, no ST elevations or depressions. Unchanged from prior from 2019. ____________________________________________  RADIOLOGY  I have personally reviewed the images performed during this visit and I agree with the Radiologist's read.   Interpretation by Radiologist:  DG Chest 2 View  Result Date: 02/14/2020 CLINICAL DATA:  Cough, shortness of breath, hypoxia, and fever since yesterday. EXAM: CHEST - 2 VIEW COMPARISON:  01/05/2020 FINDINGS: Heart size and pulmonary vascularity are normal. Patchy airspace disease suggested in the left mid and lower lung with peribronchial thickening. Changes may represent bronchitis or multifocal pneumonia. No pleural effusions. No pneumothorax. Mediastinal contours appear intact. Calcification of the aorta. IMPRESSION: Patchy airspace disease in the left mid and lower lung with peribronchial thickening suggesting bronchitis or multifocal pneumonia. Electronically Signed   By: Burman Nieves M.D.   On: 02/14/2020 21:31     ____________________________________________   PROCEDURES  Procedure(s) performed:yes .1-3 Lead EKG Interpretation Performed by: Nita Sickle, MD Authorized by: Nita Sickle, MD     Interpretation: non-specific     ECG rate assessment: normal     Rhythm: sinus rhythm     Ectopy: none     Critical Care performed: yes  CRITICAL CARE Performed by: Nita Sickle  ?  Total critical care time: 30  min  Critical care time was exclusive of separately billable procedures and treating other patients.  Critical care was necessary to treat or prevent imminent or life-threatening deterioration.  Critical care was time spent  personally by me on the following activities: development of treatment plan with patient and/or surrogate as well as nursing, discussions with consultants, evaluation of patient's response to treatment, examination of patient, obtaining history from patient or surrogate, ordering and performing treatments and interventions, ordering and review of laboratory studies, ordering and review of radiographic studies, pulse oximetry and re-evaluation of patient's condition.  ____________________________________________   INITIAL IMPRESSION / ASSESSMENT AND PLAN / ED COURSE   74 y.o. 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 Patient found to be hypoxic satting 86% on room air which improved on 2 L nasal cannula. Chest x-ray visualized by me consistent with pneumonia, confirmed by radiology. Patient endorses several weekly episodes of aspiration. No recent hospitalizations. Will cover for community-acquired pneumonia and aspiration pneumonia with Rocephin, azithromycin and Flagyl. Patient meets sepsis criteria with fever, tachycardia, leukocytosis, and source of infection. Sepsis protocol initiated. Will give IV fluids. Tylenol for body aches. Old medical records reviewed. History gathered from patient and her friend who was at bedside, plan discussed with both of them. Will consult hospitalist for admission      _____________________________________________ Please note:  Patient was evaluated in Emergency Department today for the symptoms  described in the history of present illness. Patient was evaluated in the context of the global COVID-19 pandemic, which necessitated consideration that the patient might be at risk for infection with the SARS-CoV-2 virus that causes COVID-19. Institutional protocols and algorithms that pertain to the evaluation of patients at risk for COVID-19 are in a state of rapid change based on information released by regulatory bodies including the CDC and federal and state organizations. These policies and algorithms were followed during the patient's care in the ED.  Some ED evaluations and interventions may be delayed as a result of limited staffing during the pandemic.   [place] Controlled Substance Database was reviewed by me. ____________________________________________   FINAL CLINICAL IMPRESSION(S) / ED DIAGNOSES   Final diagnoses:  Acute respiratory failure with hypoxia (HCC)  Pneumonia due to infectious organism, unspecified laterality, unspecified part of lung      NEW MEDICATIONS STARTED DURING THIS VISIT:  ED Discharge Orders    None       Note:  This document was prepared using Dragon voice recognition software and may include unintentional dictation errors.    Nita Sickle, MD 02/15/20 [phone]

## 2020-02-15 NOTE — Progress Notes (Signed)
Objective Swallowing Evaluation: Type of Study: MBS-Modified Barium Swallow Study   Patient Details  Name: Suzanne Strong MRN: [id number] Date of Birth: 25-Oct-1945  Today's Date: 02/15/2020 Time: SLP Start Time (ACUTE ONLY): 11 -SLP Stop Time (ACUTE ONLY): 1545  SLP Time Calculation (min) (ACUTE ONLY): 30 min   Past Medical History:  Past Medical History:  Diagnosis Date   Anemia    iron deficiency and B12 deficiency   Anxiety    Breast cancer ([name]) 02/25/2015   upper-outer quadrant of left female breast, Triple Negative. Neo-adjuvant chemo with complete pathologic response.    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   BREAST CYST ASPIRATION     BREAST EXCISIONAL BIOPSY Left 12/2015   surgery   BREAST LUMPECTOMY WITH NEEDLE LOCALIZATION Left 10/02/2015   Procedure: BREAST LUMPECTOMY WITH NEEDLE LOCALIZATION;  Surgeon: [name], MD;  Location: [facility];  Service: General;  Laterality: Left;   BREAST LUMPECTOMY WITH SENTINEL LYMPH NODE BIOPSY Left 10/02/2015   Procedure: LEFT BREAST WIDE EXCISION WITH SENTINEL LYMPH NODE BX;  Surgeon: [name], MD;  Location: [facility];  Service: General;  Laterality: Left;   BREAST SURGERY     breast biopsy benign   CARPAL TUNNEL RELEASE Right 05/19/2017   Procedure: CARPAL  TUNNEL RELEASE;  Surgeon: Earnestine Leys, MD;  Location: [facility];  Service: Orthopedics;  Laterality: Right;   CATARACT EXTRACTION W/PHACO Right 10/26/2018   Procedure: CATARACT EXTRACTION PHACO AND INTRAOCULAR LENS PLACEMENT ([place]) right diabetic;  Surgeon: Leandrew Koyanagi, MD;  Location: [place];  Service: Ophthalmology;  Laterality: Right;  Diabetic - oral meds cancer center been notified and will come   CATARACT EXTRACTION W/PHACO Left 11/16/2018   Procedure: CATARACT EXTRACTION PHACO AND INTRAOCULAR LENS PLACEMENT ([facility]) LEFT DIABETIC  01:01.0  17.0%  10.37;  Surgeon: Leandrew Koyanagi, MD;  Location: [place];  Service: Ophthalmology;  Laterality: Left;  Diabetic - oral meds Port-a-cath   CHOLECYSTECTOMY     Cologuard  07/22/2016   Negative   ESOPHAGOGASTRODUODENOSCOPY  07/2009   normal with few gastric polyps   MOLE REMOVAL     PORT-A-CATH REMOVAL     PORTACATH PLACEMENT     PORTACATH PLACEMENT Right 03/12/2015   Procedure: INSERTION PORT-A-CATH;  Surgeon: [name], MD;  Location: [facility];  Service: General;  Laterality: Right;   RADICAL NECK DISSECTION     TONSILLECTOMY     cancer treated with chemo and radiation   TRIGGER FINGER RELEASE Right 05/19/2017   Procedure: RELEASE TRIGGER FINGER/A-1 PULLEY ring finger;  Surgeon: Earnestine Leys, MD;  Location: [facility];  Service: Orthopedics;  Laterality: Right;   HPI: Pt is a 74 y.o. 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  Pt's recent status is complicated by chronic cough and several weekly episodes of aspiration (chronic per MBSS in 07/2019 which revealed aspiration w/ all consistencies, also impacted by pharyngeal residue), and she presents for evaluation of shortness of breath. Pt reports that she has been feeling sick for the last 4 to  5 days. Fevers at home. Started feeling short of breath yesterday. Has had a cough but she is not sure if this is different than her chronic cough. Has been feeling extremely fatigued and weak with difficulty walking today.  Pt self reported episodes of Regurgitation d/t feelings of foods "getting stuck here"(pointed to her breastbone area).   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 Overall function appears somewhat improved compared with MBS in June 2021, which showed silent aspiration of thin and nectar thick liquids, intermittently. Patient did aspirate after the swallow with larger sips of thin and nectar, and when performing liquid wash of solid, however these aspiration events elicited a protective cough response. Oral phase is note for xerostomia, and impaired bolus control with premature spillage of liquids to the pyriform sinuses before swallow initiation. Pharyngeal stage is characterized by reduced base of tongue retraction, resulting in residue (mild valleculae with thin, moderate with thicker liquids and solids in the valleculae, pyriforms, and along the posterior pharyngeal wall). Epiglottic inversion and airway closure is incomplete. With larger sips, liquid fills the pyriforms and spills into the laryngeal vestibule during and after the swallow, with eventual aspiration, x2. Cough response cleared the airway.   Hyolaryngeal excursion is reduced, which appears to contribute to reduced amplitude and duration of cricopharyngeal opening. Presence of CP bar noted; combined with possibly altered pharyngeal pressures, also impacts pharyngeal stripping of the bolus. Several compensatory maneuvers were attempted including dry swallow,  liquids wash, chin tuck with dry swallow, and super-supraglottic maneuver. Aspiration risk is judged to be moderate at this time, given active pneumonia. The safest diet at this time appears to be thin liquids and puree, and meds crushed in puree with use of compensations. Recommend small bites and sips. Pt should alternate solids with liquids. Super-supraglottic maneuver is recommended (breath hold, swallow, immediate cough, swallow again), particularly with larger sips and when following a solid bolus, multiple swallows, throat clearing intermittently throughout meal. With resolution of acute illness it would be reasonable for patient to advance solids with continued use of the above precautions. Patient was educated regarding these findings and recommendations for strict, thorough oral care to minimize oral bacteria load. SLP to follow.  SLP Visit Diagnosis Dysphagia, oropharyngeal phase (R13.12);Dysphagia, pharyngeal phase (R13.13);Dysphagia, pharyngoesophageal phase (R13.14)  Attention and concentration deficit following --  Frontal lobe and executive function deficit following --  Impact on safety and function Moderate aspiration risk;Risk for inadequate nutrition/hydration      CHL IP TREATMENT RECOMMENDATION 02/15/2020  Treatment Recommendations Therapy as outlined in treatment plan below     Prognosis 02/15/2020  Prognosis for Safe Diet Advancement Fair  Barriers to Reach Goals Time post onset;Severity of deficits  Barriers/Prognosis Comment --    CHL IP DIET RECOMMENDATION 02/15/2020  SLP Diet Recommendations Dysphagia 1 (Puree) solids;Thin liquid  Liquid Administration via Cup  Medication Administration Crushed with puree  Compensations Slow rate;Small sips/bites;Follow solids with liquid;Multiple dry swallows after each bite/sip;Hard cough after swallow;Effortful swallow;Clear throat intermittently  Postural Changes Seated upright at 90 degrees;Remain semi-upright after after  feeds/meals (Comment)      CHL IP OTHER RECOMMENDATIONS 02/15/2020  Recommended Consults Consider esophageal assessment  Oral Care Recommendations Oral care QID  Other Recommendations (No Data)      CHL IP FOLLOW UP RECOMMENDATIONS 02/15/2020  Follow up Recommendations Other (comment)      CHL IP FREQUENCY AND DURATION 02/15/2020  Speech Therapy Frequency (ACUTE ONLY) min 2x/week  Treatment Duration 2 weeks           CHL IP ORAL PHASE 02/15/2020  Oral Phase Impaired  Oral - Pudding Teaspoon --  Oral - Pudding Cup --  Oral - Honey Teaspoon --  Oral - Honey Cup --  Oral - Nectar Teaspoon --  Oral - Nectar Cup Premature spillage  Oral - Nectar Straw --  Oral - Thin Teaspoon Premature spillage  Oral - Thin Cup Premature spillage  Oral - Thin Straw --  Oral - Puree Premature spillage  Oral - Mech Soft Impaired mastication;Weak lingual manipulation  Oral - Regular --  Oral - Multi-Consistency --  Oral - Pill --  Oral Phase - Comment --    CHL IP PHARYNGEAL PHASE 02/15/2020  Pharyngeal Phase Impaired  Pharyngeal- Pudding Teaspoon --  Pharyngeal --  Pharyngeal- Pudding Cup --  Pharyngeal --  Pharyngeal- Honey Teaspoon --  Pharyngeal --  Pharyngeal- Honey Cup --  Pharyngeal --  Pharyngeal- Nectar Teaspoon --  Pharyngeal --  Pharyngeal- Nectar Cup Penetration/Aspiration during swallow;Penetration/Apiration after swallow;Moderate aspiration;Pharyngeal residue - valleculae;Pharyngeal residue - pyriform;Pharyngeal residue - posterior pharnyx;Delayed swallow initiation-pyriform sinuses;Reduced epiglottic inversion;Reduced anterior laryngeal mobility;Reduced airway/laryngeal closure;Reduced tongue base retraction  Pharyngeal Material enters airway, CONTACTS cords and then ejected out;Material enters airway, passes BELOW cords then ejected out  Pharyngeal- Nectar Straw --  Pharyngeal --  Pharyngeal- Thin Teaspoon Delayed swallow initiation-pyriform sinuses;Reduced epiglottic  inversion;Reduced anterior laryngeal mobility;Reduced airway/laryngeal closure;Reduced tongue base retraction;Pharyngeal residue - valleculae  Pharyngeal Material does not enter airway  Pharyngeal- Thin Cup Delayed swallow initiation-pyriform sinuses;Reduced epiglottic inversion;Reduced anterior laryngeal mobility;Reduced airway/laryngeal closure;Reduced tongue base retraction;Penetration/Aspiration during swallow;Penetration/Apiration after swallow;Trace aspiration;Pharyngeal residue - valleculae  Pharyngeal Material does not enter airway;Material enters airway, CONTACTS cords and then ejected out;Material enters airway, passes BELOW cords then ejected out  Pharyngeal- Thin Straw --  Pharyngeal --  Pharyngeal- Puree Reduced epiglottic inversion;Reduced anterior laryngeal mobility;Reduced airway/laryngeal closure;Reduced tongue base retraction;Penetration/Apiration after swallow;Pharyngeal residue - valleculae;Pharyngeal residue - pyriform;Pharyngeal residue - posterior pharnyx;Pharyngeal residue - cp segment  Pharyngeal Material enters airway, passes BELOW cords then ejected out;Material does not enter airway  Pharyngeal- Mechanical Soft Reduced epiglottic inversion;Reduced anterior laryngeal mobility;Reduced airway/laryngeal closure;Reduced tongue base retraction;Penetration/Apiration after swallow;Moderate aspiration;Pharyngeal residue - valleculae;Pharyngeal residue - pyriform;Pharyngeal residue - posterior pharnyx;Pharyngeal residue - cp segment  Pharyngeal Material enters airway, passes BELOW cords then ejected out;Material enters airway, remains ABOVE vocal cords then ejected out  Pharyngeal- Regular --  Pharyngeal --  Pharyngeal- Multi-consistency --  Pharyngeal --  Pharyngeal- Pill --  Pharyngeal --  Pharyngeal Comment --     CHL IP CERVICAL ESOPHAGEAL PHASE 02/15/2020  Cervical Esophageal Phase Impaired  Pudding Teaspoon --  Pudding Cup --  Honey Teaspoon --  Honey Cup --  Nectar  Teaspoon --  Nectar Cup Reduced cricopharyngeal relaxation;Prominent cricopharyngeal segment  Nectar Straw --  Thin  Teaspoon Reduced cricopharyngeal relaxation;Prominent cricopharyngeal segment  Thin Cup Reduced cricopharyngeal relaxation;Prominent cricopharyngeal segment  Thin Straw --  Puree Reduced cricopharyngeal relaxation;Prominent cricopharyngeal segment  Mechanical Soft Reduced cricopharyngeal relaxation;Prominent cricopharyngeal segment  Regular --  Multi-consistency --  Pill --  Cervical Esophageal Comment --    Rondel Baton, MS, CCC-SLP Speech-Language Pathologist  Arlana Lindau 02/15/2020, 5:12 PM

## 2020-02-15 NOTE — Evaluation (Signed)
Clinical/Bedside Swallow Evaluation Patient Details  Name: Suzanne Strong MRN: [id number] Date of Birth: 1945-05-07  Today's Date: 02/15/2020 Time: SLP Start Time (ACUTE ONLY): 1330 SLP Stop Time (ACUTE ONLY): 1430 SLP Time Calculation (min) (ACUTE ONLY): 60 min  Past Medical History:  Past Medical History:  Diagnosis Date  . Anemia    iron deficiency and B12 deficiency  . Anxiety   . Breast cancer ([facility]) 02/25/2015   upper-outer quadrant of left female breast, Triple Negative. Neo-adjuvant chemo with complete pathologic response.   . Cervical dysplasia    conization  . Diabetes mellitus without complication ([facility])   . Diverticulosis   . Fever 04/11/2015  . Gastroparesis    related to previous radiation tx  . GERD (gastroesophageal reflux disease)   . Hyperlipidemia   . Hypertension   . Neuropathy    legs/feet, S/P chemo meds  . Personal history of chemotherapy   . Personal history of radiation therapy 2017   LEFT lumpectomy w/ radiation  . RLS (restless legs syndrome)   . Shingles    Hx of  . 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[facility]) 2006  . Wears dentures    partial bottom   Past Surgical History:  Past Surgical History:  Procedure Laterality Date  . APPENDECTOMY    . BREAST BIOPSY Left 02/25/2015   INVASIVE MAMMARY CARCINOMA,Triple negative.  [name] BREAST CYST ASPIRATION    . BREAST EXCISIONAL BIOPSY Left 12/2015   surgery  . BREAST LUMPECTOMY WITH NEEDLE LOCALIZATION Left 10/02/2015   Procedure: BREAST LUMPECTOMY WITH NEEDLE LOCALIZATION;  Surgeon: Earline Mayotte, MD;  Location: [facility];  Service: General;  Laterality: Left;  . BREAST LUMPECTOMY WITH SENTINEL LYMPH NODE BIOPSY Left 10/02/2015   Procedure: LEFT BREAST WIDE EXCISION WITH SENTINEL LYMPH NODE BX;  Surgeon: Earline Mayotte, MD;  Location: [facility];  Service: General;  Laterality: Left;  . BREAST SURGERY     breast biopsy benign  . CARPAL TUNNEL RELEASE Right 05/19/2017   Procedure: CARPAL TUNNEL RELEASE;  Surgeon:  Deeann Saint, MD;  Location: [facility];  Service: Orthopedics;  Laterality: Right;  . CATARACT EXTRACTION W/PHACO Right 10/26/2018   Procedure: CATARACT EXTRACTION PHACO AND INTRAOCULAR LENS PLACEMENT ([facility]) right diabetic;  Surgeon: Lockie Mola, MD;  Location: [facility];  Service: Ophthalmology;  Laterality: Right;  Diabetic - oral meds cancer center been notified and will come  . CATARACT EXTRACTION W/PHACO Left 11/16/2018   Procedure: CATARACT EXTRACTION PHACO AND INTRAOCULAR LENS PLACEMENT ([facility]) LEFT DIABETIC  01:01.0  17.0%  10.37;  Surgeon: Lockie Mola, MD;  Location: [facility];  Service: Ophthalmology;  Laterality: Left;  Diabetic - oral meds Port-a-cath  . CHOLECYSTECTOMY    . Cologuard  07/22/2016   Negative  . ESOPHAGOGASTRODUODENOSCOPY  07/2009   normal with few gastric polyps  . MOLE REMOVAL    . PORT-A-CATH REMOVAL    . PORTACATH PLACEMENT    . PORTACATH PLACEMENT Right 03/12/2015   Procedure: INSERTION PORT-A-CATH;  Surgeon: Earline Mayotte, MD;  Location: [facility];  Service: General;  Laterality: Right;  . RADICAL NECK DISSECTION    . TONSILLECTOMY     cancer treated with chemo and radiation  . TRIGGER FINGER RELEASE Right 05/19/2017   Procedure: RELEASE TRIGGER FINGER/A-1 PULLEY ring finger;  Surgeon: Deeann Saint, MD;  Location: [facility];  Service: Orthopedics;  Laterality: Right;   HPI:  Pt is a 74 y.o. female with a history of remote breast cancer, remote tonsillar cancer in 2006 w/  XRT and chronic PMH including: GERD, cervical dysplasia, Gastroparesis, HTN, DM, anemia, anxiety, RLS, hyperlipidemia, Obesity.  Pt's recent status is complicated by chronic cough and several weekly episodes of aspiration (chronic per MBSS in 07/2019 which revealed aspiration w/ all consistencies, also impacted by pharyngeal residue), and she presents for evaluation of shortness of breath. Pt reports that she has been feeling sick for the last 4 to 5 days.  Fevers at home. Started feeling short of breath yesterday. Has had a cough but she is not sure if this is different than her chronic cough. Has been feeling extremely fatigued and weak with difficulty walking today.  Pt self reported episodes of Regurgitation d/t feelings of foods "getting stuck here"(pointed to her breastbone area).   Assessment / Plan / Recommendation Clinical Impression  Pt appears to present w/ oropharyngeal phase dysphagia; Baseline h/o pharyngeal phase deficits per [facility] 07/2019. Pt endorses "weekly" episodes of aspriation at home. Pt also endorses Esophageal phase Dysmotility which can impact the pharyngeal phase of swallowing and increase risk for Retrograde flow of material thus increasing risk for aspiration of Reflux material. ANY pharyngeal and/or Esophageal phase deficits/dysphagia can increase risk for aspiration thus Pulmonary impact from the negative sequelae of aspiration. Pt has documented aspiration in the MBSS performed 07/2019 and self-reports of such at home; her PCP/MDs are aware of this dx/reports. Pt stated she has been "doing ok" at home; however, she presents today w/ admission for Pneumonia. During this BSE, pt appeared to exhibit overt, clinical s/s of aspiration moreso w/ thin liquid trials; decreased laryngeal excursion noted during the swallows. No immediate, overt s/s of aspiration noted w/ trials of Nectar liquids, purees, and softened solids. No sustained decline in respiratory presentation or coughing noted post thin liquid trials. Would be concerned for Silent aspiration of po's during/post intake. Min increased oral phase deficits noted w/ increased textured foods suspect related to pt's generalized weakness. Pt appeared to best manage trials of Nectar liquids and purees/broken down foods w/ timely, effective manipulation, A-P transfer, and oral clearing. Alternating foods/liquids appeared to aid final oral clearing. OM exam revealed weak posterior/protrusion  lingual strength; no other significant oral motor weakness or ROM deficits noted -- suspect lingual weakness is related to tonsillar Ca, XRT in 2006 as generalized weakness and atrophy can occur post tx. Pt fed self w/ support d/t generalized weakness.  Recommend NPO status until MBSS is completed to objectively assess pt's swallowing function/status -- in order to make best recommendation of an oral diet; oral medications. Recommend frequent oral care for hygiene and stimulation of swallowing. NSG and MD consulted, agreed w/ POC. SLP Visit Diagnosis: Dysphagia, pharyngoesophageal phase (R13.14);Dysphagia, oropharyngeal phase (R13.12)    Aspiration Risk  Moderate aspiration risk;Risk for inadequate nutrition/hydration    Diet Recommendation  NPO until completion of MBSS; frequent oral care for hygiene and stimulation of swallowing  Medication Administration: Via alternative means    Other  Recommendations Recommended Consults: Consider GI evaluation;Consider esophageal assessment (Dietician f/u) Oral Care Recommendations: Oral care QID;Staff/trained caregiver to provide oral care Other Recommendations:  (TBD)   Follow up Recommendations  (TBD)      Frequency and Duration  (TBD)   (TBD)       Prognosis Prognosis for Safe Diet Advancement: Guarded Barriers to Reach Goals: Time post onset;Severity of deficits (dysphagia)      Swallow Study   General Date of Onset: 02/14/20 HPI: Pt is a 74 y.o. female with a history of remote breast cancer, remote  tonsillar cancer in 2006 w/ XRT and chronic PMH including: GERD, cervical dysplasia, Gastroparesis, HTN, DM, anemia, anxiety, RLS, hyperlipidemia, Obesity.  Pt's recent status is complicated by chronic cough and several weekly episodes of aspiration (chronic per MBSS in 07/2019 which revealed aspiration w/ all consistencies, also impacted by pharyngeal residue), and she presents for evaluation of shortness of breath. Pt reports that she has been  feeling sick for the last 4 to 5 days. Fevers at home. Started feeling short of breath yesterday. Has had a cough but she is not sure if this is different than her chronic cough. Has been feeling extremely fatigued and weak with difficulty walking today.  Pt self reported episodes of Regurgitation d/t feelings of foods "getting stuck here"(pointed to her breastbone area). Type of Study: Bedside Swallow Evaluation Previous Swallow Assessment: MBSS 07/2019 -- dysphagia diet recommended then Diet Prior to this Study: NPO (per pt report - regular diet w/ thin liquids at home) Temperature Spikes Noted: No (wbc 11.7) Respiratory Status: Nasal cannula (2 L) History of Recent Intubation: No Behavior/Cognition: Alert;Cooperative;Pleasant mood;Distractible;Requires cueing (min drowsy) Oral Cavity Assessment: Dry;Dried secretions (sticky) Oral Care Completed by SLP: Yes Oral Cavity - Dentition: Adequate natural dentition (partial on bottom) Vision: Functional for self-feeding Self-Feeding Abilities: Able to feed self;Needs assist;Needs set up (generalized weakness) Patient Positioning: Upright in bed (needed positioning) Baseline Vocal Quality: Low vocal intensity (Dysphonia) Volitional Cough: Strong;Congested Volitional Swallow: Able to elicit    Oral/Motor/Sensory Function Overall Oral Motor/Sensory Function: Mild impairment (++) Facial ROM: Within Functional Limits Facial Symmetry: Within Functional Limits Facial Strength: Within Functional Limits Facial Sensation: Within Functional Limits Lingual ROM: Within Functional Limits (grossly) Lingual Symmetry: Within Functional Limits Lingual Strength: Reduced (posterior, protrusion) Lingual Sensation: Within Functional Limits Mandible: Within Functional Limits   Ice Chips Ice chips: Within functional limits Presentation: Spoon (fed; 4 trials)   Thin Liquid Thin Liquid: Impaired Presentation: Cup;Self Fed (supported; 4 trials) Oral Phase  Impairments:  (wfl) Oral Phase Functional Implications:  (wfl) Pharyngeal  Phase Impairments: Suspected delayed Swallow;Cough - Delayed (x2)    Nectar Thick Nectar Thick Liquid: Within functional limits (appeared) Presentation: Cup;Self Fed (supported; ~2-3 ozs total)   Honey Thick Honey Thick Liquid: Not tested   Puree Puree: Within functional limits (appeared) Presentation: Spoon (fed; 10 trials)   Solid     Solid: Impaired Presentation: Spoon (fed; 5 trials) Oral Phase Impairments: Impaired mastication (reduced efforrt) Oral Phase Functional Implications: Impaired mastication;Prolonged oral transit;Oral residue (slight-min) Pharyngeal Phase Impairments:  (wfl)        Orinda Kenner, MS, CCC-SLP Speech Language Pathologist Rehab Services [phone] Danyel Tobey 02/15/2020,3:35 PM

## 2020-02-15 NOTE — Progress Notes (Signed)
Notified bedside nurse of need to draw lactic acid.  

## 2020-02-15 NOTE — ED Notes (Signed)
Pt transported to 117-1C at this time by RN Sam

## 2020-02-15 NOTE — Progress Notes (Signed)
PHARMACIST - PHYSICIAN COMMUNICATION  CONCERNING:  Enoxaparin (Lovenox) for DVT Prophylaxis    RECOMMENDATION: Patient was prescribed enoxaprin 40mg  q24 hours for VTE prophylaxis.   Filed Weights   02/15/20 0930  Weight: 101 kg (222 lb 10.6 oz)    Body mass index is 37.05 kg/m.  Estimated Creatinine Clearance: 51.6 mL/min (A) (by C-G formula based on SCr of 1.16 mg/dL (H)).   Based on [facility] policy patient is candidate for enoxaparin 0.5mg /kg TBW SQ every 24 hours based on BMI being >30.   DESCRIPTION: Pharmacy has adjusted enoxaparin dose per [facility] policy.  Patient is now receiving enoxaparin 50 mg every 24 hours    [facility], PharmD Clinical Pharmacist  02/15/2020 10:43 AM

## 2020-02-15 NOTE — H&P (Signed)
History and Physical    MATASHA Strong [id number] DOB: 1945/05/23 DOA: 02/15/2020  Referring MD/NP/PA:   PCP: [name], MD   Patient coming from:  The patient is coming from home.  At baseline, pt is independent for most of ADL.        Chief Complaint: cough, SOB, fever  HPI: Suzanne Strong is a 74 y.o. female with medical history significant of hypertension, hyperlipidemia, diabetes mellitus, GERD, depression, anxiety, tonsillar cancer, RLS, left breast cancer (s/p of lumpectomy, radiation and chemotherapy), gastroparesis, anemia, who presents with cough, shortness breath, fever.  Pt is drowsy and mildly confused, but arousable. She answered most questions appropriately. Patient states that she has been feeling sick in the past several days.  She has fever and chills with body temperature 100.9 at home.  She states that she has chronic cough and started having shortness breath since yesterday, which has been progressively worsening. She states that she has several week history of episodic aspiration whenever she eats at home. She has been feeling extremely fatigued and weak with difficulty walking.  Denies unilateral numbness or tingling in extremities, no facial droop or slurred speech. Per her friend (I called her friend by phone), patient has intermittent chronic diarrhea, but no active nausea, vomiting, diarrhea or abdominal pain currently. No symptoms of UTI. Patient was found to have oxygen desaturation to 85-88% on room air, which improved to 99% on 2 L oxygen in ED.  ED Course: pt was found to have negative COVID-19 PCR, AKI with creatinine 1.16, BUN 23 (creatinine 0.88 on 10/20/2019), GFR 50, WBC 11.7, lactic acid 1.4, troponin level 18, 19, blood pressure 118/54, heart rate 104, RR 20.  Chest x-ray showed a patchy infiltrate in the left middle lobe and left lower lobe.  CT head negative for acute intracranial abnormalities.  Patient is admitted to [facility] bed as  inpatient.   Review of Systems:   General: has fevers, chills, no body weight gain, has fatigue HEENT: no blurry vision, hearing changes or sore throat Respiratory: has dyspnea, coughing, no wheezing CV: no chest pain, no palpitations GI: no nausea, vomiting, abdominal pain, has diarrhea, no constipation GU: no dysuria, burning on urination, increased urinary frequency, hematuria  Ext: no leg edema Neuro: no unilateral weakness, numbness, or tingling, no vision change or hearing loss. Has confusion Skin: no rash, no skin tear. MSK: No muscle spasm, no deformity, no limitation of range of movement in spin Heme: No easy bruising.  Travel history: No recent long distant travel.  Allergy:  Allergies  Allergen Reactions   Amoxicillin Swelling    REACTION: rash, swelling Has patient had a PCN reaction causing immediate rash, facial/tongue/throat swelling, SOB or lightheadedness with hypotension: yes Has patient had a PCN reaction causing severe rash involving mucus membranes or skin necrosis: no Has patient had a PCN reaction that required hospitalization: no Has patient had a PCN reaction occurring within the last 10 years: yes If all of the above answers are "NO", then may proceed with Cephalosporin use.    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 Neo-adjuvant chemo with complete pathologic response.    Cervical dysplasia    conization   Diabetes mellitus without complication ([place])    Diverticulosis    Fever 04/11/2015   Gastroparesis    related to previous radiation tx   GERD (gastroesophageal reflux disease)    Hyperlipidemia     Hypertension    Neuropathy    legs/feet, S/P chemo meds   Personal history of chemotherapy    Personal history of radiation therapy 2017   LEFT lumpectomy w/ radiation   RLS (restless legs syndrome)    Shingles    Hx of   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[place]) 2006   Wears dentures    partial bottom    Past Surgical History:  Procedure Laterality Date   APPENDECTOMY     BREAST BIOPSY Left 02/25/2015   INVASIVE MAMMARY CARCINOMA,Triple negative.   BREAST CYST ASPIRATION     BREAST EXCISIONAL BIOPSY Left 12/2015   surgery   BREAST LUMPECTOMY WITH NEEDLE LOCALIZATION Left 10/02/2015   Procedure: BREAST LUMPECTOMY WITH NEEDLE LOCALIZATION;  Surgeon: [name], MD;  Location: [facility];  Service: General;  Laterality: Left;   BREAST LUMPECTOMY WITH SENTINEL LYMPH NODE BIOPSY Left 10/02/2015   Procedure: LEFT BREAST WIDE EXCISION WITH SENTINEL LYMPH NODE BX;  Surgeon: [name], MD;  Location: [facility];  Service: General;  Laterality: Left;   BREAST SURGERY     breast biopsy benign   CARPAL TUNNEL RELEASE Right 05/19/2017   Procedure: CARPAL TUNNEL RELEASE;  Surgeon: Earnestine Leys, MD;  Location: [facility];  Service: Orthopedics;  Laterality: Right;   CATARACT EXTRACTION W/PHACO Right 10/26/2018   Procedure: CATARACT EXTRACTION PHACO AND INTRAOCULAR LENS PLACEMENT ([place]) right diabetic;  Surgeon: Leandrew Koyanagi, MD;  Location: [facility];  Service: Ophthalmology;  Laterality: Right;  Diabetic - oral meds cancer center been notified and will come   CATARACT EXTRACTION W/PHACO Left 11/16/2018   Procedure: CATARACT EXTRACTION PHACO AND INTRAOCULAR LENS PLACEMENT ([facility]) LEFT DIABETIC  01:01.0  17.0%  10.37;  Surgeon: Leandrew Koyanagi, MD;  Location: [facility];  Service: Ophthalmology;  Laterality: Left;  Diabetic - oral meds Port-a-cath   CHOLECYSTECTOMY     Cologuard  07/22/2016   Negative   ESOPHAGOGASTRODUODENOSCOPY  07/2009   normal with  few gastric polyps   MOLE REMOVAL     PORT-A-CATH REMOVAL     PORTACATH PLACEMENT     PORTACATH PLACEMENT Right 03/12/2015   Procedure: INSERTION PORT-A-CATH;  Surgeon: [name], MD;  Location: [facility];  Service: General;  Laterality: Right;   RADICAL NECK DISSECTION     TONSILLECTOMY     cancer treated with chemo and radiation   TRIGGER FINGER RELEASE Right 05/19/2017   Procedure: RELEASE TRIGGER FINGER/A-1 PULLEY ring finger;  Surgeon: Earnestine Leys, MD;  Location: [facility];  Service: Orthopedics;  Laterality: Right;    Social History:  reports that she quit smoking about 35 years ago. Her smoking use included cigarettes. She has a 3.00 pack-year smoking history. She has never used smokeless tobacco. She reports that she does not drink alcohol and does not use drugs.  Family History:  Family History  Problem Relation Age of Onset   Osteoporosis Mother    Hyperlipidemia Mother    Depression Mother    Cancer Mother        skin cancer ? basal cell and lung ca heavy smoker   Alcohol abuse Father    Cancer Father  skin CA ? basal cell   Heart disease Father        CHF   Hyperlipidemia Brother    Hypertension Brother    Breast cancer Neg Hx      Prior to Admission medications   Medication Sig Start Date End Date Taking? Authorizing Provider  albuterol (VENTOLIN HFA) 108 (90 Base) MCG/ACT inhaler INHALE TWO PUFFS BY MOUTH EVERY SIX HOURS AS NEEDED 01/09/20   Tower, [name], MD  ALPRAZolam Duanne Moron) 0.5 MG tablet Take 1 tablet (0.5 mg total) by mouth 2 (two) times daily as needed for anxiety or sleep. 09/20/19   Thayer Headings, PMHNP  amitriptyline (ELAVIL) 50 MG tablet Take 1 tablet (50 mg total) by mouth at bedtime. 12/07/16   Cammie Sickle, MD  atorvastatin (LIPITOR) 10 MG tablet TAKE 1 TABLET(10 MG) BY MOUTH DAILY 06/02/19   Tower, [name], MD  b complex vitamins tablet Take 1 tablet by mouth daily.      [provider]  busPIRone  (BUSPAR) 15 MG tablet Take 1 tablet (15 mg total) by mouth 2 (two) times daily. 02/07/20 03/08/20  Thayer Headings, PMHNP  chlorpheniramine-HYDROcodone (TUSSIONEX PENNKINETIC ER) 10-8 MG/5ML SUER Take 5 mLs by mouth every 12 (twelve) hours as needed for cough. Caution of sedation 12/22/19   Tower, [name], MD  Cholecalciferol (VITAMIN D-1000 MAX ST) 25 MCG (1000 UT) tablet Take 2,000 Units by mouth daily.     [provider]  glipiZIDE (GLUCOTROL) 10 MG tablet Take 10 mg by mouth 2 (two) times daily before a meal.  03/13/15   [provider]  glucose blood test strip To check blood glucose once daily and prn for diabetes 10/20/19   Tower, [name], MD  JANUMET XR 50-1000 MG TB24 Take 1 tablet by mouth 2 (two) times daily.  03/13/15   [provider]  lidocaine-prilocaine (EMLA) cream Apply 1 application topically as needed. Apply to port when going through Chemo    [provider]  MELOXICAM PO Take by mouth as needed.    [provider]  Multiple Vitamin (MULTIVITAMIN) tablet Take 1 tablet by mouth daily.    [provider]  Multiple Vitamins-Minerals (WOMENS MULTIVITAMIN PO) Take by mouth.    [provider]  omeprazole (PRILOSEC) 20 MG capsule TAKE 1 CAPSULE(20 MG) BY MOUTH AT BEDTIME 06/02/19   Tower, [place] A, MD  pregabalin (LYRICA) 150 MG capsule Take 1 capsule by mouth 2 (two) times a day. 07/26/18 07/26/19  [provider]  rOPINIRole (REQUIP) 1 MG tablet TAKE 1/2 TABLET BY MOUTH EVERY MORNING and TAKE THREE TABLETS BY MOUTH EVERYDAY AT BEDTIME 12/21/19   Tower, [place] A, MD  sertraline (ZOLOFT) 100 MG tablet TAKE 2 TABLETS BY MOUTH EVERY DAY 09/20/19   Thayer Headings, PMHNP  Suvorexant (BELSOMRA) 20 MG TABS Take 20 mg by mouth at bedtime. 01/19/20 02/18/20  Thayer Headings, PMHNP    Physical Exam: Vitals:   02/15/20 0652 02/15/20 0756 02/15/20 0930 02/15/20 1044  BP: (!) 118/54 111/63  (!) 116/47  Pulse: 95 93  90  Resp: 18 18  17    Temp:    98 F (36.7 C)  TempSrc:      SpO2: 92% 93%  96%  Weight:   101 kg   Height:   5\' 5"  (1.651 m)    General: Not in acute distress HEENT:       Eyes: PERRL, EOMI, no scleral icterus.  ENT: No discharge from the ears and nose, no pharynx injection, no tonsillar enlargement.        Neck: No JVD, no bruit, no mass felt. Heme: No neck lymph node enlargement. Cardiac: S1/S2, RRR, No murmurs, No gallops or rubs. Respiratory: Has coarse breathing sound bilaterally GI: Soft, nondistended, nontender, no rebound pain, no organomegaly, BS present. GU: No hematuria Ext: No pitting leg edema bilaterally. 1+DP/PT pulse bilaterally. Musculoskeletal: No joint deformities, No joint redness or warmth, no limitation of ROM in spin. Skin: No rashes.  Neuro: drowsy, mildly confused, but arousable, oriented X3, cranial nerves II-XII grossly intact, moves all extremities. Psych: Patient is not psychotic, no suicidal or hemocidal ideation.  Labs on Admission: I have personally reviewed following labs and imaging studies  CBC: Recent Labs  Lab 02/14/20 2111  WBC 11.7*  HGB 10.8*  HCT 33.8*  MCV 87.3  PLT [id number]   Basic Metabolic Panel: Recent Labs  Lab 02/14/20 2111  NA 136  K 4.2  CL 101  CO2 24  GLUCOSE 133*  BUN 23  CREATININE 1.16*  CALCIUM 9.7   GFR: Estimated Creatinine Clearance: 51.6 mL/min (A) (by C-G formula based on SCr of 1.16 mg/dL (H)). Liver Function Tests: No results for input(s): AST, ALT, ALKPHOS, BILITOT, PROT, ALBUMIN in the last 168 hours. No results for input(s): LIPASE, AMYLASE in the last 168 hours. No results for input(s): AMMONIA in the last 168 hours. Coagulation Profile: No results for input(s): INR, PROTIME in the last 168 hours. Cardiac Enzymes: No results for input(s): CKTOTAL, CKMB, CKMBINDEX, TROPONINI in the last 168 hours. BNP (last 3 results) No results for input(s): PROBNP in the last 8760 hours. HbA1C: No results for input(s):  HGBA1C in the last 72 hours. CBG: Recent Labs  Lab 02/14/20 2058  GLUCAP 124*   Lipid Profile: No results for input(s): CHOL, HDL, LDLCALC, TRIG, CHOLHDL, LDLDIRECT in the last 72 hours. Thyroid Function Tests: No results for input(s): TSH, T4TOTAL, FREET4, T3FREE, THYROIDAB in the last 72 hours. Anemia Panel: No results for input(s): VITAMINB12, FOLATE, FERRITIN, TIBC, IRON, RETICCTPCT in the last 72 hours. Urine analysis:    Component Value Date/Time   COLORURINE Yellow 11/02/2011 1708   COLORURINE yellow 11/26/2009 1200   APPEARANCEUR Hazy 11/02/2011 1708   LABSPEC 1.025 11/02/2011 1708   PHURINE 6.0 11/02/2011 1708   PHURINE 6.0 11/26/2009 1200   GLUCOSEU 50 mg/dL 11/02/2011 1708   HGBUR Negative 11/02/2011 1708   HGBUR negative 11/26/2009 1200   BILIRUBINUR 1 mg/dL 01/05/2020 0921   BILIRUBINUR Negative 11/02/2011 1708   KETONESUR Trace 11/02/2011 1708   PROTEINUR Positive (A) 01/05/2020 0921   PROTEINUR Negative 11/02/2011 1708   UROBILINOGEN 0.2 01/05/2020 0921   UROBILINOGEN 0.2 11/26/2009 1200   NITRITE Negative 01/05/2020 0921   NITRITE Negative 11/02/2011 1708   NITRITE negative 11/26/2009 1200   LEUKOCYTESUR Negative 01/05/2020 0921   LEUKOCYTESUR Trace 11/02/2011 1708   Sepsis Labs: @LABRCNTIP (procalcitonin:4,lacticidven:4) ) Recent Results (from the past 240 hour(s))  Resp Panel by RT-PCR (Flu A&B, Covid) Nasopharyngeal Swab     Status: None   Collection Time: 02/14/20  9:59 PM   Specimen: Nasopharyngeal Swab; Nasopharyngeal(NP) swabs in vial transport medium  Result Value Ref Range Status   SARS Coronavirus 2 by RT PCR NEGATIVE NEGATIVE Final    Comment: (NOTE) SARS-CoV-2 target nucleic acids are NOT DETECTED.  The SARS-CoV-2 RNA is generally detectable in upper respiratory specimens during the acute phase of infection. The lowest concentration of SARS-CoV-2  viral copies this assay can detect is 138 copies/mL. A negative result does not preclude  SARS-Cov-2 infection and should not be used as the sole basis for treatment or other patient management decisions. A negative result may occur with  improper specimen collection/handling, submission of specimen other than nasopharyngeal swab, presence of viral mutation(s) within the areas targeted by this assay, and inadequate number of viral copies(<138 copies/mL). A negative result must be combined with clinical observations, patient history, and epidemiological information. The expected result is Negative.  Fact Sheet for Patients:  EntrepreneurPulse.com.au  Fact Sheet for Healthcare Providers:  IncredibleEmployment.be  This test is no t yet approved or cleared by the [place] FDA and  has been authorized for detection and/or diagnosis of SARS-CoV-2 by FDA under an Emergency Use Authorization (EUA). This EUA will remain  in effect (meaning this test can be used) for the duration of the COVID-19 declaration under Section 564(b)(1) of the Act, 21 U.S.C.section 360bbb-3(b)(1), unless the authorization is terminated  or revoked sooner.       Influenza A by PCR NEGATIVE NEGATIVE Final   Influenza B by PCR NEGATIVE NEGATIVE Final    Comment: (NOTE) The Xpert Xpress SARS-CoV-2/FLU/RSV plus assay is intended as an aid in the diagnosis of influenza from Nasopharyngeal swab specimens and should not be used as a sole basis for treatment. Nasal washings and aspirates are unacceptable for Xpert Xpress SARS-CoV-2/FLU/RSV testing.  Fact Sheet for Patients: EntrepreneurPulse.com.au  Fact Sheet for Healthcare Providers: IncredibleEmployment.be  This test is not yet approved or cleared by the [place] FDA and has been authorized for detection and/or diagnosis of SARS-CoV-2 by FDA under an Emergency Use Authorization (EUA). This EUA will remain in effect (meaning this test can be used) for the duration of  the COVID-19 declaration under Section 564(b)(1) of the Act, 21 U.S.C. section 360bbb-3(b)(1), unless the authorization is terminated or revoked.  Performed at [facility], [address] [place], [place] [address]   Blood culture (routine x 2)     Status: None (Preliminary result)   Collection Time: 02/15/20  6:06 AM   Specimen: BLOOD  Result Value Ref Range Status   Specimen Description BLOOD RAV  Final   Special Requests   Final    BOTTLES DRAWN AEROBIC AND ANAEROBIC Blood Culture adequate volume   Culture   Final    NO GROWTH <12 HOURS Performed at [facility], [address] [place], [place] [address]    Report Status PENDING  Incomplete     Radiological Exams on Admission: DG Chest 2 View  Result Date: 02/14/2020 CLINICAL DATA:  Cough, shortness of breath, hypoxia, and fever since yesterday. EXAM: CHEST - 2 VIEW COMPARISON:  01/05/2020 FINDINGS: Heart size and pulmonary vascularity are normal. Patchy airspace disease suggested in the left mid and lower lung with peribronchial thickening. Changes may represent bronchitis or multifocal pneumonia. No pleural effusions. No pneumothorax. Mediastinal contours appear intact. Calcification of the aorta. IMPRESSION: Patchy airspace disease in the left mid and lower lung with peribronchial thickening suggesting bronchitis or multifocal pneumonia. Electronically Signed   By: Lucienne Capers M.D.   On: 02/14/2020 21:31     EKG: I have personally reviewed.  Sinus rhythm, QTC 431, low voltage, tachycardia, Q waves in lead III/aVF  Assessment/Plan Principal Problem:   Aspiration pneumonia (HCC) Active Problems:   Hyperlipidemia   Depression with anxiety   Diabetes mellitus without complication (HCC)   GERD (gastroesophageal reflux disease)   Hypertension  AKI (acute kidney injury) (HCC)   Elevated troponin   Sepsis (HCC)   Acute respiratory failure with hypoxia (HCC)   Acute metabolic  encephalopathy   Acute respiratory failure with hypoxia and sepsis due to aspiration pneumonia [name]): Patient admits critical for sepsis with tachycardia, fever of 100.9.  Patient also has leukocytosis.  Lactic acid is normal.  Currently hemodynamically stable.  - Will admit to med-surg bed as inpt - IV Rocephin and azithromycin, flagyl - Mucinex for cough  - Bronchodilators - Urine legionella and S. pneumococcal antigen - Follow up blood culture x2, sputum culture - will get Procalcitonin and trend lactic acid level per sepsis protocol - IVF: 2L of LRNS bolus in ED -SLP --> speech specialist recommended to consult GI, Dr. Tobi Bastos is consulted.  Hyperlipidemia: -Lipitor  Depression with anxiety: Patient is on Xanax, amitriptyline, BuSpar, Requip, Zoloft, trazodone.  Patient is also on Suvorezant and Lyric. Patient has altered mental status.  I suspect patient has polypharmacy. -Will hold all sedative medications now  Diabetes mellitus without complication (HCC): A1c 8.0 recently, poorly controlled.  Patient is taking glipizide and Janumet -SSI  GERD (gastroesophageal reflux disease) -Protonix  Hypertension -IV hydralazine as needed  AKI (acute kidney injury) (HCC): Baseline creatinine 0.88 on 10/20/2019, her creatinine is 1.16, BUN 23, GFR 50, likely due to dehydration and continuation for Mobic -IV fluid as above -Hold Mobic  Elevated troponin: Troponin 18, 19.  Likely due to demand ischemia -aspirin, Lipitor -Trend troponin -Check A1c, FLP  Acute metabolic encephalopathy: Etiology is not clear.  CT head is negative for acute intracranial abnormalities.  I suspect patient has polypharmacy -Frequent neuro check -Hold all sedative medications         DVT ppx: SQ Lovenox Code Status: Full code Family Communication:  Yes, patient's friend by phone Disposition Plan:  Anticipate discharge back to previous environment Consults called: Dr. Tobi Bastos of GI Admission status:  Med-surg bed as inpt      Status is: Inpatient  Remains inpatient appropriate because:Inpatient level of care appropriate due to severity of illness.  Patient has multiple comorbidities, now presents with acute respiratory failure with hypoxia due to possible aspiration pneumonia versus CAP.  Patient also has sepsis, AKI, elevated troponin.  Her presentation is highly complicated.  Patient is at high risk of deteriorating.  Need to be treated in hospital for at least 2 days.   Dispo: The patient is from: Home              Anticipated d/c is to: Home              Anticipated d/c date is: 2 days              Patient currently is not medically stable to d/c.           Date of Service 02/15/2020    Lorretta Harp [facility]   If 7PM-7AM, please contact night-coverage [email or web address] 02/15/2020, 11:29 AM

## 2020-02-15 NOTE — ED Notes (Signed)
Pt up to bathroom in wheelchair with 1x assist at this time. Pt tolerated well.

## 2020-02-15 NOTE — ED Notes (Signed)
Meal tray placed at bedside at this time

## 2020-02-16 DIAGNOSIS — J690 Pneumonitis due to inhalation of food and vomit: Secondary | ICD-10-CM

## 2020-02-16 LAB — CBC
HCT: 29.8 % — ABNORMAL LOW (ref 36.0–46.0)
Hemoglobin: 9.2 g/dL — ABNORMAL LOW (ref 12.0–15.0)
MCH: 27.4 pg (ref 26.0–34.0)
MCHC: 30.9 g/dL (ref 30.0–36.0)
MCV: 88.7 fL (ref 80.0–100.0)
Platelets: 171 10*3/uL (ref 150–400)
RBC: 3.36 MIL/uL — ABNORMAL LOW (ref 3.87–5.11)
RDW: 14.9 % (ref 11.5–15.5)
WBC: 6.9 10*3/uL (ref 4.0–10.5)
nRBC: 0 % (ref 0.0–0.2)

## 2020-02-16 LAB — BASIC METABOLIC PANEL
Anion gap: 10 (ref 5–15)
BUN: 20 mg/dL (ref 8–23)
CO2: 29 mmol/L (ref 22–32)
Calcium: 8.8 mg/dL — ABNORMAL LOW (ref 8.9–10.3)
Chloride: 98 mmol/L (ref 98–111)
Creatinine, Ser: 0.95 mg/dL (ref 0.44–1.00)
GFR, Estimated: >60 mL/min (ref >60)
Glucose, Bld: 193 mg/dL — ABNORMAL HIGH (ref 70–99)
Potassium: 3.6 mmol/L (ref 3.5–5.1)
Sodium: 137 mmol/L (ref 135–145)

## 2020-02-16 LAB — LIPID PANEL
Cholesterol: 121 mg/dL (ref 0–200)
HDL: 48 mg/dL (ref >40)
LDL Cholesterol: 57 mg/dL (ref 0–99)
Total CHOL/HDL Ratio: 2.5 RATIO
Triglycerides: 78 mg/dL (ref <150)
VLDL: 16 mg/dL (ref 0–40)

## 2020-02-16 LAB — GLUCOSE, CAPILLARY
Glucose-Capillary: 121 mg/dL — ABNORMAL HIGH (ref 70–99)
Glucose-Capillary: 169 mg/dL — ABNORMAL HIGH (ref 70–99)
Glucose-Capillary: 119 mg/dL — ABNORMAL HIGH (ref 70–99)
Glucose-Capillary: 132 mg/dL — ABNORMAL HIGH (ref 70–99)

## 2020-02-16 LAB — LEGIONELLA PNEUMOPHILA SEROGP 1 UR AG: L. pneumophila Serogp 1 Ur Ag: NEGATIVE

## 2020-02-16 MED ORDER — BUSPIRONE HCL 15 MG PO TABS
15.0000 mg | ORAL_TABLET | Freq: Two times a day (BID) | ORAL | Status: DC
  Administered 2020-02-16 – 2020-02-17 (×2): 15 mg via ORAL
  Filled 2020-02-16 (×4): qty 1

## 2020-02-16 MED ORDER — BUSPIRONE HCL 15 MG PO TABS: 15.0000 mg | ORAL_TABLET | Freq: Three times a day (TID) | ORAL | Status: DC

## 2020-02-16 MED ORDER — ROPINIROLE HCL 1 MG PO TABS
1.0000 mg | ORAL_TABLET | Freq: Every day | ORAL | Status: DC
  Administered 2020-02-16: 1 mg via ORAL
  Filled 2020-02-16: qty 1

## 2020-02-16 MED ORDER — AMITRIPTYLINE HCL 25 MG PO TABS
25.0000 mg | ORAL_TABLET | Freq: Every day | ORAL | Status: DC
  Administered 2020-02-16: 23:00:00 25 mg via ORAL
  Filled 2020-02-16: qty 1

## 2020-02-16 MED ORDER — AZITHROMYCIN 500 MG PO TABS
500.0000 mg | ORAL_TABLET | Freq: Every day | ORAL | Status: DC
Start: 2020-02-17 — End: 2020-02-17
  Administered 2020-02-17: 08:00:00 500 mg via ORAL
  Filled 2020-02-16: qty 1

## 2020-02-16 MED ORDER — SODIUM CHLORIDE 0.9 % IV SOLN
1.0000 g | INTRAVENOUS | Status: DC
  Administered 2020-02-17: 09:00:00 1 g via INTRAVENOUS
  Filled 2020-02-16: qty 1

## 2020-02-16 NOTE — Progress Notes (Signed)
PROGRESS NOTE    Suzanne Strong  MWU:[id number] DOB: 07/26/1945 DOA: 02/15/2020 PCP: Judy Pimple, MD   Chief Complain: Cough, shortness of breath, fever  Brief Narrative: Patient is a 74 year old female with history of hypertension, hyperlipidemia, diabetes type 2, GERD, depression/anxiety, tonsillar cancer, restless leg syndrome, left breast cancer, gastroparesis, anemia who presents with complaints of cough, shortness of breath and fever. She was feeling very weak, also complained of intermittent chronic diarrhea. On presentation she was hypoxic on room air which improved with 2 L of oxygen. Covid patient negative. She had mild elevated leukocytes on presentation. Chest x-ray showed patchy infiltrate in the left middle lobe and left lower lobe. Patient was admitted for management of multifocal pneumonia most likely from aspiration. Speech therapy consulted.  Assessment & Plan:   Principal Problem:   Aspiration pneumonia (HCC) Active Problems:   Hyperlipidemia   Depression with anxiety   Diabetes mellitus without complication (HCC)   GERD (gastroesophageal reflux disease)   Hypertension   AKI (acute kidney injury) (HCC)   Elevated troponin   Sepsis (HCC)   Acute respiratory failure with hypoxia (HCC)   Acute metabolic encephalopathy   Acute respiratory with hypoxia: Presented with dyspnea, weakness, hypoxia. Needed oxygen supplementation at 2L.min for maintenance of saturation. Continue to try to wean the oxygen. She is not on oxygen at home  Aspiration pneumonia: Presented with fever, cough. Chest x-ray as above. Started on ceftriaxone, azithromycin and Flagyl. Continue bronchodilators, cough medications. Blood cultures, sputum cultures have not shown any growth. Speech therapy following and recommended dysphagia 1 diet. GI was also consulted during this hospitalization. GI wants to follow her as an outpatient for possible EGD for evaluation of dysphagia  Altered mental  status: Most likely from acute metabolic encepaholpathy from pneumonia. CT head did not show  any acute intracranial normalities. Polypharmacy could also have contributed. Currently she is alert and oriented.  Tonsillar cancer/breast cancer: Status post lumpectomy, radiation and chemotherapy. We recommend follow-up with oncology as an outpatient  Hyperlipidemia: Continue Lipitor  Depression/anxiety: On Xanax, amitriptyline, BuSpar, Requip, Zoloft, trazodone at home .Also on Suvorezant and Lyrica. Patient was confused on presentation. Could be from polypharmacy. All sedative medications held  Diabetes type 2: A1c of 8, poorly controlled. On glipizide, Janumet at home. Continue sliding scale insulin. Monitor blood sugars  GERD: Continue Protonix  Hypertension: Continue to monitor blood pressure. Continue anxiety medications  AKI: Resolved.Mild elevated creatinine on presentation. Her baseline creatinine is normal.   Mild elevated troponin: Denies any chest pain. Likely due to demand ischemia. No intervention planned         DVT prophylaxis:Lovenox  Code Status: Full Family Communication: None at bedside Status is: Inpatient  Remains inpatient appropriate because: critical illness  Dispo: The patient is from: Home              Anticipated d/c is to: Home              Anticipated d/c date is: 2 days              Patient currently is not medically stable to d/c.    Consultants: None  Procedures:None  Antimicrobials:  Anti-infectives (From admission, onward)   Start     Dose/Rate Route Frequency Ordered Stop   02/16/20 0600  cefTRIAXone (ROCEPHIN) 1 g in sodium chloride 0.9 % 100 mL IVPB        1 g 200 mL/hr over 30 Minutes Intravenous  Once 02/15/20 0807 02/16/20 7253  02/16/20 0500  azithromycin (ZITHROMAX) tablet 500 mg        500 mg Oral Daily 02/15/20 0807     02/15/20 1600  metroNIDAZOLE (FLAGYL) IVPB 500 mg        500 mg 100 mL/hr over 60 Minutes Intravenous  Every 8 hours 02/15/20 1447     02/15/20 1400  metroNIDAZOLE (FLAGYL) tablet 500 mg  Status:  Discontinued        500 mg Oral Every 8 hours 02/15/20 0807 02/15/20 1447   02/15/20 0600  metroNIDAZOLE (FLAGYL) tablet 500 mg        500 mg Oral  Once 02/15/20 0550 02/15/20 0631   02/15/20 0545  cefTRIAXone (ROCEPHIN) 1 g in sodium chloride 0.9 % 100 mL IVPB        1 g 200 mL/hr over 30 Minutes Intravenous  Once 02/15/20 0541 02/15/20 0652   02/15/20 0545  azithromycin (ZITHROMAX) tablet 500 mg        500 mg Oral  Once 02/15/20 0541 02/15/20 0603      Subjective:  Patient seen and examined the bedside this morning. Looks much better today. Sitting on the chair. Still requiring 2 L of oxygen per minute. Denies shortness of breath. Eager to go home  Objective: Vitals:   02/15/20 1728 02/15/20 1950 02/15/20 2317 02/16/20 0727  BP: (!) 125/54 (!) 118/48 (!) 128/52 131/65  Pulse: 84 91 88 75  Resp: 16 18 18 18   Temp: 98.4 F (36.9 C) 98.4 F (36.9 C) 98.4 F (36.9 C) 98.3 F (36.8 C)  TempSrc:  Oral Oral   SpO2: 95% 95% 98% 95%  Weight:      Height:       No intake or output data in the 24 hours ending 02/16/20 0753 Filed Weights   02/15/20 0930  Weight: 101 kg    Examination:  General exam: Appears calm and comfortable ,Not in distress,obese HEENT:PERRL,Oral mucosa moist, Ear/Nose normal on gross exam Respiratory system: Bilateral diminished breath sounds, no wheezes or crackles  Cardiovascular system: S1 & S2 heard, RRR. No JVD, murmurs, rubs, gallops or clicks. No pedal edema. Gastrointestinal system: Abdomen is nondistended, soft and nontender. No organomegaly or masses felt. Normal bowel sounds heard. Central nervous system: Alert and oriented. No focal neurological deficits. Extremities: No edema, no clubbing ,no cyanosis Skin: No rashes, lesions or ulcers,no icterus ,no pallor  Data Reviewed: I have personally reviewed following labs and imaging studies  CBC: Recent  Labs  Lab 02/14/20 2111 02/16/20 0457  WBC 11.7* 6.9  HGB 10.8* 9.2*  HCT 33.8* 29.8*  MCV 87.3 88.7  PLT 209 [id number]   Basic Metabolic Panel: Recent Labs  Lab 02/14/20 2111 02/16/20 0457  NA 136 137  K 4.2 3.6  CL 101 98  CO2 24 29  GLUCOSE 133* 193*  BUN 23 20  CREATININE 1.16* 0.95  CALCIUM 9.7 8.8*   GFR: Estimated Creatinine Clearance: 63 mL/min (by C-G formula based on SCr of 0.95 mg/dL). Liver Function Tests: No results for input(s): AST, ALT, ALKPHOS, BILITOT, PROT, ALBUMIN in the last 168 hours. No results for input(s): LIPASE, AMYLASE in the last 168 hours. No results for input(s): AMMONIA in the last 168 hours. Coagulation Profile: No results for input(s): INR, PROTIME in the last 168 hours. Cardiac Enzymes: No results for input(s): CKTOTAL, CKMB, CKMBINDEX, TROPONINI in the last 168 hours. BNP (last 3 results) No results for input(s): PROBNP in the last 8760 hours. HbA1C: Recent Labs  02/15/20 0927  HGBA1C 6.1*   CBG: Recent Labs  Lab 02/14/20 2058 02/15/20 1316 02/15/20 1730 02/15/20 1954 02/16/20 0724  GLUCAP 124* 91 128* 111* 121*   Lipid Profile: Recent Labs    02/16/20 0457  CHOL 121  HDL 48  LDLCALC 57  TRIG 78  CHOLHDL 2.5   Thyroid Function Tests: No results for input(s): TSH, T4TOTAL, FREET4, T3FREE, THYROIDAB in the last 72 hours. Anemia Panel: No results for input(s): VITAMINB12, FOLATE, FERRITIN, TIBC, IRON, RETICCTPCT in the last 72 hours. Sepsis Labs: Recent Labs  Lab 02/15/20 0606 02/15/20 1052  PROCALCITON 1.81  --   LATICACIDVEN 1.4 1.8    Recent Results (from the past 240 hour(s))  Resp Panel by RT-PCR (Flu A&B, Covid) Nasopharyngeal Swab     Status: None   Collection Time: 02/14/20  9:59 PM   Specimen: Nasopharyngeal Swab; Nasopharyngeal(NP) swabs in vial transport medium  Result Value Ref Range Status   SARS Coronavirus 2 by RT PCR NEGATIVE NEGATIVE Final    Comment: (NOTE) SARS-CoV-2 target nucleic acids  are NOT DETECTED.  The SARS-CoV-2 RNA is generally detectable in upper respiratory specimens during the acute phase of infection. The lowest concentration of SARS-CoV-2 viral copies this assay can detect is 138 copies/mL. A negative result does not preclude SARS-Cov-2 infection and should not be used as the sole basis for treatment or other patient management decisions. A negative result may occur with  improper specimen collection/handling, submission of specimen other than nasopharyngeal swab, presence of viral mutation(s) within the areas targeted by this assay, and inadequate number of viral copies(<138 copies/mL). A negative result must be combined with clinical observations, patient history, and epidemiological information. The expected result is Negative.  Fact Sheet for Patients:  EntrepreneurPulse.com.au  Fact Sheet for Healthcare Providers:  IncredibleEmployment.be  This test is no t yet approved or cleared by the [place] FDA and  has been authorized for detection and/or diagnosis of SARS-CoV-2 by FDA under an Emergency Use Authorization (EUA). This EUA will remain  in effect (meaning this test can be used) for the duration of the COVID-19 declaration under Section 564(b)(1) of the Act, 21 U.S.C.section 360bbb-3(b)(1), unless the authorization is terminated  or revoked sooner.       Influenza A by PCR NEGATIVE NEGATIVE Final   Influenza B by PCR NEGATIVE NEGATIVE Final    Comment: (NOTE) The Xpert Xpress SARS-CoV-2/FLU/RSV plus assay is intended as an aid in the diagnosis of influenza from Nasopharyngeal swab specimens and should not be used as a sole basis for treatment. Nasal washings and aspirates are unacceptable for Xpert Xpress SARS-CoV-2/FLU/RSV testing.  Fact Sheet for Patients: EntrepreneurPulse.com.au  Fact Sheet for Healthcare Providers: IncredibleEmployment.be  This test is  not yet approved or cleared by the [place] FDA and has been authorized for detection and/or diagnosis of SARS-CoV-2 by FDA under an Emergency Use Authorization (EUA). This EUA will remain in effect (meaning this test can be used) for the duration of the COVID-19 declaration under Section 564(b)(1) of the Act, 21 U.S.C. section 360bbb-3(b)(1), unless the authorization is terminated or revoked.  Performed at [facility], [address] [place], [place] [address]   Blood culture (routine x 2)     Status: None (Preliminary result)   Collection Time: 02/15/20  6:06 AM   Specimen: BLOOD  Result Value Ref Range Status   Specimen Description BLOOD RAV  Final   Special Requests   Final    BOTTLES DRAWN AEROBIC  AND ANAEROBIC Blood Culture adequate volume   Culture   Final    NO GROWTH 1 DAY Performed at [facility], [address] [place], [place] [address]    Report Status PENDING  Incomplete  Blood culture (routine x 2)     Status: None (Preliminary result)   Collection Time: 02/15/20  6:07 AM   Specimen: BLOOD  Result Value Ref Range Status   Specimen Description BLOOD LEFT ARM  Final   Special Requests   Final    BOTTLES DRAWN AEROBIC AND ANAEROBIC Blood Culture adequate volume   Culture   Final    NO GROWTH < 24 HOURS Performed at [facility], [address] [place], [place] [address]    Report Status PENDING  Incomplete         Radiology Studies: DG Chest 2 View  Result Date: 02/14/2020 CLINICAL DATA:  Cough, shortness of breath, hypoxia, and fever since yesterday. EXAM: CHEST - 2 VIEW COMPARISON:  01/05/2020 FINDINGS: Heart size and pulmonary vascularity are normal. Patchy airspace disease suggested in the left mid and lower lung with peribronchial thickening. Changes may represent bronchitis or multifocal pneumonia. No pleural effusions. No pneumothorax. Mediastinal contours appear intact. Calcification of the aorta. IMPRESSION:  Patchy airspace disease in the left mid and lower lung with peribronchial thickening suggesting bronchitis or multifocal pneumonia. Electronically Signed   By: Lucienne Capers M.D.   On: 02/14/2020 21:31   CT HEAD WO CONTRAST  Result Date: 02/15/2020 CLINICAL DATA:  Mental status change, pneumonia EXAM: CT HEAD WITHOUT CONTRAST TECHNIQUE: Contiguous axial images were obtained from the base of the skull through the vertex without intravenous contrast. COMPARISON:  Correlation made with 2019 MRI brain FINDINGS: Brain: There is no acute intracranial hemorrhage, mass effect, or edema. Gray-white differentiation is preserved. There is no extra-axial fluid collection. Ventricles and sulci are within normal limits in size and configuration. Vascular: There is atherosclerotic calcification at the skull base. Skull: Calvarium is unremarkable. Sinuses/Orbits: Evidence of prior sinonasal surgery with primarily ethmoid and sphenoid sinus mucosal thickening. Orbits are unremarkable. Other: None. IMPRESSION: No acute intracranial abnormality. Electronically Signed   By: Macy Mis M.D.   On: 02/15/2020 12:17   DG Swallowing Func-Speech Pathology  Result Date: 02/15/2020 Objective Swallowing Evaluation: Type of Study: MBS-Modified Barium Swallow Study  Patient Details Name: Suzanne Strong MRN: [id number] Date of Birth: 09-16-1945 Today's Date: 02/15/2020 Time: SLP Start Time (ACUTE ONLY): 1515 -SLP Stop Time (ACUTE ONLY): 1545 SLP Time Calculation (min) (ACUTE ONLY): 30 min Past Medical History: Past Medical History: Diagnosis Date . Anemia   iron deficiency and B12 deficiency . Anxiety  . Breast cancer ([place]) 02/25/2015  upper-outer quadrant of left female breast, Triple Negative. Neo-adjuvant chemo with complete pathologic response.  . Cervical dysplasia   conization . Diabetes mellitus without complication ([place])  . Diverticulosis  . Fever 04/11/2015 . Gastroparesis   related to previous radiation tx . GERD  (gastroesophageal reflux disease)  . Hyperlipidemia  . Hypertension  . Neuropathy   legs/feet, S/P chemo meds . Personal history of chemotherapy  . Personal history of radiation therapy 2017  LEFT lumpectomy w/ radiation . RLS (restless legs syndrome)  . Shingles   Hx of . 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[place]) 2006 . Wears dentures   partial bottom Past Surgical History: Past Surgical History: Procedure Laterality Date . APPENDECTOMY   . BREAST BIOPSY Left 02/25/2015  INVASIVE MAMMARY CARCINOMA,Triple negative. [name] BREAST CYST ASPIRATION   . BREAST EXCISIONAL  BIOPSY Left 12/2015  surgery . BREAST LUMPECTOMY WITH NEEDLE LOCALIZATION Left 10/02/2015  Procedure: BREAST LUMPECTOMY WITH NEEDLE LOCALIZATION;  Surgeon: [name], MD;  Location: [facility];  Service: General;  Laterality: Left; . BREAST LUMPECTOMY WITH SENTINEL LYMPH NODE BIOPSY Left 10/02/2015  Procedure: LEFT BREAST WIDE EXCISION WITH SENTINEL LYMPH NODE BX;  Surgeon: [name], MD;  Location: [facility];  Service: General;  Laterality: Left; . BREAST SURGERY    breast biopsy benign . CARPAL TUNNEL RELEASE Right 05/19/2017  Procedure: CARPAL TUNNEL RELEASE;  Surgeon: Earnestine Leys, MD;  Location: [facility];  Service: Orthopedics;  Laterality: Right; . CATARACT EXTRACTION W/PHACO Right 10/26/2018  Procedure: CATARACT EXTRACTION PHACO AND INTRAOCULAR LENS PLACEMENT ([place]) right diabetic;  Surgeon: Leandrew Koyanagi, MD;  Location: [place];  Service: Ophthalmology;  Laterality: Right;  Diabetic - oral meds cancer center been notified and will come . CATARACT EXTRACTION W/PHACO Left 11/16/2018  Procedure: CATARACT EXTRACTION PHACO AND INTRAOCULAR LENS PLACEMENT ([facility]) LEFT DIABETIC  01:01.0  17.0%  10.37;  Surgeon: Leandrew Koyanagi, MD;  Location: [place];  Service: Ophthalmology;  Laterality: Left;  Diabetic - oral meds Port-a-cath . CHOLECYSTECTOMY   . Cologuard  07/22/2016  Negative . ESOPHAGOGASTRODUODENOSCOPY  07/2009  normal with few  gastric polyps . MOLE REMOVAL   . PORT-A-CATH REMOVAL   . PORTACATH PLACEMENT   . PORTACATH PLACEMENT Right 03/12/2015  Procedure: INSERTION PORT-A-CATH;  Surgeon: [name], MD;  Location: [facility];  Service: General;  Laterality: Right; . RADICAL NECK DISSECTION   . TONSILLECTOMY    cancer treated with chemo and radiation . TRIGGER FINGER RELEASE Right 05/19/2017  Procedure: RELEASE TRIGGER FINGER/A-1 PULLEY ring finger;  Surgeon: Earnestine Leys, MD;  Location: [facility];  Service: Orthopedics;  Laterality: Right; HPI: Pt is a 74 y.o. 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  Pt's recent status is complicated by chronic cough and several weekly episodes of aspiration (chronic per MBSS in 07/2019 which revealed aspiration w/ all consistencies, also impacted by pharyngeal residue), and she presents for evaluation of shortness of breath. Pt reports that she has been feeling sick for the last 4 to 5 days. Fevers at home. Started feeling short of breath yesterday. Has had a cough but she is not sure if this is different than her chronic cough. Has been feeling extremely fatigued and weak with difficulty walking today.  Pt self reported episodes of Regurgitation d/t feelings of foods "getting stuck here"(pointed to her breastbone area).  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 Overall function appears somewhat improved compared with MBS in June 2021, which showed silent aspiration of thin and nectar thick liquids, intermittently. Patient did aspirate after the swallow with larger sips of thin  and nectar, and when performing liquid wash of solid, however these aspiration events elicited a protective cough response. Oral phase is note for xerostomia, and impaired bolus control with premature spillage of liquids to the pyriform sinuses before swallow initiation. Pharyngeal stage is characterized by reduced base of tongue retraction, resulting in residue (mild valleculae with thin, moderate with thicker liquids and solids in the valleculae, pyriforms, and along the posterior pharyngeal wall).  Epiglottic inversion and airway closure is incomplete. With larger sips, liquid fills the pyriforms and spills into the laryngeal vestibule during and after the swallow, with eventual aspiration, x2. Cough response cleared the airway.   Hyolaryngeal excursion is reduced, which appears to contribute to reduced amplitude and duration of cricopharyngeal opening. Presence of CP bar noted; combined with possibly altered pharyngeal pressures, also impacts pharyngeal stripping of the bolus. Several compensatory maneuvers were attempted including dry swallow, liquids wash, chin tuck with dry swallow, and super-supraglottic maneuver. Aspiration risk is judged to be moderate at this time, given active pneumonia. The safest diet at this time appears to be thin liquids and puree, and meds crushed in puree with use of compensations. Recommend small bites and sips. Pt should alternate solids with liquids. Super-supraglottic maneuver is recommended (breath hold, swallow, immediate cough, swallow again), particularly with larger sips and when following a solid bolus, multiple swallows, throat clearing intermittently throughout meal. With resolution of acute illness it would be reasonable for patient to advance solids with continued use of the above precautions. Patient was educated regarding these findings and recommendations for strict, thorough oral care to minimize oral bacteria load. SLP to follow. SLP Visit Diagnosis Dysphagia,  oropharyngeal phase (R13.12);Dysphagia, pharyngeal phase (R13.13);Dysphagia, pharyngoesophageal phase (R13.14) Attention and concentration deficit following -- Frontal lobe and executive function deficit following -- Impact on safety and function Moderate aspiration risk;Risk for inadequate nutrition/hydration   CHL IP TREATMENT RECOMMENDATION 02/15/2020 Treatment Recommendations Therapy as outlined in treatment plan below   Prognosis 02/15/2020 Prognosis for Safe Diet Advancement Fair Barriers to Reach Goals Time post onset;Severity of deficits Barriers/Prognosis Comment -- CHL IP DIET RECOMMENDATION 02/15/2020 SLP Diet Recommendations Dysphagia 1 (Puree) solids;Thin liquid Liquid Administration via Cup Medication Administration Crushed with puree Compensations Slow rate;Small sips/bites;Follow solids with liquid;Multiple dry swallows after each bite/sip;Hard cough after swallow;Effortful swallow;Clear throat intermittently Postural Changes Seated upright at 90 degrees;Remain semi-upright after after feeds/meals (Comment)   CHL IP OTHER RECOMMENDATIONS 02/15/2020 Recommended Consults Consider esophageal assessment Oral Care Recommendations Oral care QID Other Recommendations (No Data)   CHL IP FOLLOW UP RECOMMENDATIONS 02/15/2020 Follow up Recommendations Other (comment)   CHL IP FREQUENCY AND DURATION 02/15/2020 Speech Therapy Frequency (ACUTE ONLY) min 2x/week Treatment Duration 2 weeks      CHL IP ORAL PHASE 02/15/2020 Oral Phase Impaired Oral - Pudding Teaspoon -- Oral - Pudding Cup -- Oral - Honey Teaspoon -- Oral - Honey Cup -- Oral - Nectar Teaspoon -- Oral - Nectar Cup Premature spillage Oral - Nectar Straw -- Oral - Thin Teaspoon Premature spillage Oral - Thin Cup Premature spillage Oral - Thin Straw -- Oral - Puree Premature spillage Oral - Mech Soft Impaired mastication;Weak lingual manipulation Oral - Regular -- Oral - Multi-Consistency -- Oral - Pill -- Oral Phase - Comment --  CHL IP PHARYNGEAL PHASE  02/15/2020 Pharyngeal Phase Impaired Pharyngeal- Pudding Teaspoon -- Pharyngeal -- Pharyngeal- Pudding Cup -- Pharyngeal -- Pharyngeal- Honey Teaspoon -- Pharyngeal -- Pharyngeal- Honey Cup -- Pharyngeal -- Pharyngeal- Nectar Teaspoon -- Pharyngeal -- Pharyngeal- Nectar Cup Penetration/Aspiration during swallow;Penetration/Apiration after swallow;Moderate aspiration;Pharyngeal residue - valleculae;Pharyngeal residue - pyriform;Pharyngeal residue - posterior pharnyx;Delayed swallow initiation-pyriform sinuses;Reduced epiglottic inversion;Reduced anterior laryngeal mobility;Reduced airway/laryngeal closure;Reduced tongue base retraction Pharyngeal Material enters airway, CONTACTS cords and then ejected out;Material enters airway, passes BELOW cords then ejected out Pharyngeal- Nectar Straw -- Pharyngeal -- Pharyngeal- Thin Teaspoon Delayed swallow initiation-pyriform sinuses;Reduced epiglottic inversion;Reduced anterior laryngeal mobility;Reduced airway/laryngeal closure;Reduced tongue base retraction;Pharyngeal residue - valleculae Pharyngeal  Material does not enter airway Pharyngeal- Thin Cup Delayed swallow initiation-pyriform sinuses;Reduced epiglottic inversion;Reduced anterior laryngeal mobility;Reduced airway/laryngeal closure;Reduced tongue base retraction;Penetration/Aspiration during swallow;Penetration/Apiration after swallow;Trace aspiration;Pharyngeal residue - valleculae Pharyngeal Material does not enter airway;Material enters airway, CONTACTS cords and then ejected out;Material enters airway, passes BELOW cords then ejected out Pharyngeal- Thin Straw -- Pharyngeal -- Pharyngeal- Puree Reduced epiglottic inversion;Reduced anterior laryngeal mobility;Reduced airway/laryngeal closure;Reduced tongue base retraction;Penetration/Apiration after swallow;Pharyngeal residue - valleculae;Pharyngeal residue - pyriform;Pharyngeal residue - posterior pharnyx;Pharyngeal residue - cp segment Pharyngeal Material  enters airway, passes BELOW cords then ejected out;Material does not enter airway Pharyngeal- Mechanical Soft Reduced epiglottic inversion;Reduced anterior laryngeal mobility;Reduced airway/laryngeal closure;Reduced tongue base retraction;Penetration/Apiration after swallow;Moderate aspiration;Pharyngeal residue - valleculae;Pharyngeal residue - pyriform;Pharyngeal residue - posterior pharnyx;Pharyngeal residue - cp segment Pharyngeal Material enters airway, passes BELOW cords then ejected out;Material enters airway, remains ABOVE vocal cords then ejected out Pharyngeal- Regular -- Pharyngeal -- Pharyngeal- Multi-consistency -- Pharyngeal -- Pharyngeal- Pill -- Pharyngeal -- Pharyngeal Comment --  CHL IP CERVICAL ESOPHAGEAL PHASE 02/15/2020 Cervical Esophageal Phase Impaired Pudding Teaspoon -- Pudding Cup -- Honey Teaspoon -- Honey Cup -- Nectar Teaspoon -- Nectar Cup Reduced cricopharyngeal relaxation;Prominent cricopharyngeal segment Nectar Straw -- Thin Teaspoon Reduced cricopharyngeal relaxation;Prominent cricopharyngeal segment Thin Cup Reduced cricopharyngeal relaxation;Prominent cricopharyngeal segment Thin Straw -- Puree Reduced cricopharyngeal relaxation;Prominent cricopharyngeal segment Mechanical Soft Reduced cricopharyngeal relaxation;Prominent cricopharyngeal segment Regular -- Multi-consistency -- Pill -- Cervical Esophageal Comment -- Deneise Lever, MS, CCC-SLP Speech-Language Pathologist Aliene Altes 02/15/2020, 5:12 PM                   Scheduled Meds: . aspirin EC  81 mg Oral Daily  . atorvastatin  10 mg Oral Daily  . azithromycin  500 mg Oral Daily  . cholecalciferol  2,000 Units Oral Daily  . enoxaparin (LOVENOX) injection  0.5 mg/kg Subcutaneous Q24H  . insulin aspart  0-5 Units Subcutaneous QHS  . insulin aspart  0-9 Units Subcutaneous TID WC  . ipratropium-albuterol  3 mL Nebulization Q6H  . multivitamin  1 tablet Oral Daily  . multivitamin with minerals  1 tablet Oral  Daily  . pantoprazole  40 mg Oral Daily   Continuous Infusions: . metronidazole 500 mg (02/16/20 0026)     LOS: 1 day    Time spent: 25 mins.More than 50% of that time was spent in counseling and/or coordination of care.      Shelly Coss, MD [facility] P12/31/2021, 7:53 AM

## 2020-02-16 NOTE — Consult Note (Signed)
Consultation  Referring Provider: Hospitalist Admit date: 12/30 Consult date: 12/31         Reason for Consultation:   History of aspiration         HPI:   Suzanne Strong is a 74 y.o. lady with history of head and neck cancer with issues with aspiration on going since surgery/chemotherapy over 10 years ago. She presents with a pneumonia likely secondary to aspiration. She is being treated by speech therapy. GI consult requested to evaluate for any esophageal component. Patient states when food goes down she does not have any dysphagia although she does have to avoid certain foods like certain meats. She had EGD several years ago that was overall unremarkable. No odynophagia and no blood thinners. She is currently on a new oxygen requirement due to the pneumonia which is being treated with IV antibiotics. She is tolerating diet as recommended with speech therapy.  Past Medical History:  Diagnosis Date  . Anemia    iron deficiency and B12 deficiency  . Anxiety   . Breast cancer ([place]) 02/25/2015   upper-outer quadrant of left female breast, Triple Negative. Neo-adjuvant chemo with complete pathologic response.   . Cervical dysplasia    conization  . Diabetes mellitus without complication ([place])   . Diverticulosis   . Fever 04/11/2015  . Gastroparesis    related to previous radiation tx  . GERD (gastroesophageal reflux disease)   . Hyperlipidemia   . Hypertension   . Neuropathy    legs/feet, S/P chemo meds  . Personal history of chemotherapy   . Personal history of radiation therapy 2017   LEFT lumpectomy w/ radiation  . RLS (restless legs syndrome)   . Shingles    Hx of  . 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 Wears dentures    partial bottom    Past Surgical History:  Procedure Laterality Date  . APPENDECTOMY    . BREAST BIOPSY Left 02/25/2015   INVASIVE MAMMARY CARCINOMA,Triple negative.  [name] BREAST CYST ASPIRATION    . BREAST EXCISIONAL BIOPSY Left 12/2015   surgery  . BREAST  LUMPECTOMY WITH NEEDLE LOCALIZATION Left 10/02/2015   Procedure: BREAST LUMPECTOMY WITH NEEDLE LOCALIZATION;  Surgeon: [name], MD;  Location: [facility];  Service: General;  Laterality: Left;  . BREAST LUMPECTOMY WITH SENTINEL LYMPH NODE BIOPSY Left 10/02/2015   Procedure: LEFT BREAST WIDE EXCISION WITH SENTINEL LYMPH NODE BX;  Surgeon: [name], MD;  Location: [facility];  Service: General;  Laterality: Left;  . BREAST SURGERY     breast biopsy benign  . CARPAL TUNNEL RELEASE Right 05/19/2017   Procedure: CARPAL TUNNEL RELEASE;  Surgeon: Earnestine Leys, MD;  Location: [facility];  Service: Orthopedics;  Laterality: Right;  . CATARACT EXTRACTION W/PHACO Right 10/26/2018   Procedure: CATARACT EXTRACTION PHACO AND INTRAOCULAR LENS PLACEMENT ([place]) right diabetic;  Surgeon: Leandrew Koyanagi, MD;  Location: [place];  Service: Ophthalmology;  Laterality: Right;  Diabetic - oral meds cancer center been notified and will come  . CATARACT EXTRACTION W/PHACO Left 11/16/2018   Procedure: CATARACT EXTRACTION PHACO AND INTRAOCULAR LENS PLACEMENT ([facility]) LEFT DIABETIC  01:01.0  17.0%  10.37;  Surgeon: Leandrew Koyanagi, MD;  Location: [place];  Service: Ophthalmology;  Laterality: Left;  Diabetic - oral meds Port-a-cath  . CHOLECYSTECTOMY    . Cologuard  07/22/2016   Negative  . ESOPHAGOGASTRODUODENOSCOPY  07/2009   normal with few gastric polyps  . MOLE REMOVAL    . PORT-A-CATH REMOVAL    .  PORTACATH PLACEMENT    . PORTACATH PLACEMENT Right 03/12/2015   Procedure: INSERTION PORT-A-CATH;  Surgeon: [name], MD;  Location: [facility];  Service: General;  Laterality: Right;  . RADICAL NECK DISSECTION    . TONSILLECTOMY     cancer treated with chemo and radiation  . TRIGGER FINGER RELEASE Right 05/19/2017   Procedure: RELEASE TRIGGER FINGER/A-1 PULLEY ring finger;  Surgeon: Earnestine Leys, MD;  Location: [facility];  Service: Orthopedics;  Laterality: Right;     Family History  Problem Relation Age of Onset  . Osteoporosis Mother   . Hyperlipidemia Mother   . Depression Mother   . Cancer Mother        skin cancer ? basal cell and lung ca heavy smoker  . Alcohol abuse Father   . Cancer Father        skin CA ? basal cell  . Heart disease Father        CHF  . Hyperlipidemia Brother   . Hypertension Brother   . Breast cancer Neg Hx     Social History   Tobacco Use  . Smoking status: Former Smoker    Packs/day: 0.50    Years: 6.00    Pack years: 3.00    Types: Cigarettes    Quit date: 03/10/1984    Years since quitting: 35.9  . Smokeless tobacco: Never Used  Vaping Use  . Vaping Use: Never used  Substance Use Topics  . Alcohol use: No    Alcohol/week: 0.0 standard drinks  . Drug use: No    Prior to Admission medications   Medication Sig Start Date End Date Taking? Authorizing Provider  ALPRAZolam Duanne Moron) 0.5 MG tablet Take 1 tablet (0.5 mg total) by mouth 2 (two) times daily as needed for anxiety or sleep. 09/20/19  Yes Thayer Headings, PMHNP  amitriptyline (ELAVIL) 50 MG tablet Take 1 tablet (50 mg total) by mouth at bedtime. 12/07/16  Yes Cammie Sickle, MD  atorvastatin (LIPITOR) 10 MG tablet TAKE 1 TABLET(10 MG) BY MOUTH DAILY 06/02/19  Yes Tower, Marne A, MD  b complex vitamins tablet Take 1 tablet by mouth daily.   Yes [provider]  busPIRone (BUSPAR) 15 MG tablet Take 1 tablet (15 mg total) by mouth 2 (two) times daily. 02/07/20 03/08/20 Yes Thayer Headings, PMHNP  Cholecalciferol (VITAMIN D-1000 MAX ST) 25 MCG (1000 UT) tablet Take 2,000 Units by mouth daily.    Yes [provider]  glipiZIDE (GLUCOTROL) 10 MG tablet Take 10 mg by mouth 2 (two) times daily before a meal.  03/13/15  Yes [provider]  JANUMET XR 50-1000 MG TB24 Take 1 tablet by mouth 2 (two) times daily.  03/13/15  Yes [provider]  Multiple Vitamin (MULTIVITAMIN) tablet Take 1 tablet by mouth daily.   Yes  [provider]  omeprazole (PRILOSEC) 20 MG capsule TAKE 1 CAPSULE(20 MG) BY MOUTH AT BEDTIME 06/02/19  Yes Tower, Marne A, MD  rOPINIRole (REQUIP) 1 MG tablet TAKE 1/2 TABLET BY MOUTH EVERY MORNING and TAKE THREE TABLETS BY MOUTH EVERYDAY AT BEDTIME Patient taking differently: Take 0.5-3 mg by mouth 2 (two) times daily. TAKE 1/2 TABLET BY MOUTH EVERY MORNING and TAKE THREE TABLETS BY MOUTH EVERYDAY AT BEDTIME 12/21/19  Yes Tower, Marne A, MD  sertraline (ZOLOFT) 100 MG tablet TAKE 2 TABLETS BY MOUTH EVERY DAY Patient taking differently: Take 200 mg by mouth daily. TAKE 2 TABLETS BY MOUTH EVERY DAY 09/20/19  Yes Thayer Headings,  PMHNP  Suvorexant (BELSOMRA) 20 MG TABS Take 20 mg by mouth at bedtime. 01/19/20 02/18/20 Yes Corie Chiquito, PMHNP  traZODone (DESYREL) 100 MG tablet Take 100 mg by mouth at bedtime. 01/18/20  Yes [provider]  albuterol (VENTOLIN HFA) 108 (90 Base) MCG/ACT inhaler INHALE TWO PUFFS BY MOUTH EVERY SIX HOURS AS NEEDED 01/09/20   Tower, [name], MD  chlorpheniramine-HYDROcodone (TUSSIONEX PENNKINETIC ER) 10-8 MG/5ML SUER Take 5 mLs by mouth every 12 (twelve) hours as needed for cough. Caution of sedation Patient not taking: No sig reported 12/22/19   Tower, Idamae Schuller A, MD  glucose blood test strip To check blood glucose once daily and prn for diabetes 10/20/19   Tower, [name], MD  lidocaine-prilocaine (EMLA) cream Apply 1 application topically as needed. Apply to port when going through Chemo    [provider]  MELOXICAM PO Take by mouth as needed. Patient not taking: No sig reported    [provider]  Multiple Vitamins-Minerals (WOMENS MULTIVITAMIN PO) Take by mouth. Patient not taking: No sig reported    [provider]  pregabalin (LYRICA) 150 MG capsule Take 1 capsule by mouth 2 (two) times a day. 07/26/18 07/26/19  [provider]    Current Facility-Administered Medications  Medication Dose Route Frequency Provider Last  Rate Last Admin  . acetaminophen (TYLENOL) suppository 650 mg  650 mg Rectal Q6H PRN Lorretta Harp, MD      . acetaminophen (TYLENOL) tablet 650 mg  650 mg Oral Q6H PRN Lorretta Harp, MD      . albuterol (PROVENTIL) (2.5 MG/3ML) 0.083% nebulizer solution 2.5 mg  2.5 mg Nebulization Q4H PRN Lorretta Harp, MD      . aspirin EC tablet 81 mg  81 mg Oral Daily Lorretta Harp, MD   81 mg at 02/16/20 0855  . atorvastatin (LIPITOR) tablet 10 mg  10 mg Oral Daily Lorretta Harp, MD   10 mg at 02/16/20 0855  . [START ON 02/17/2020] azithromycin (ZITHROMAX) tablet 500 mg  500 mg Oral Daily Burnadette Pop, MD      . Melene Muller ON 02/17/2020] cefTRIAXone (ROCEPHIN) 1 g in sodium chloride 0.9 % 100 mL IVPB  1 g Intravenous Q24H Burnadette Pop, MD      . cholecalciferol (VITAMIN D3) tablet 2,000 Units  2,000 Units Oral Daily Lorretta Harp, MD   2,000 Units at 02/16/20 0855  . dextromethorphan-guaiFENesin (MUCINEX DM) 30-600 MG per 12 hr tablet 1 tablet  1 tablet Oral BID PRN Lorretta Harp, MD      . enoxaparin (LOVENOX) injection 50 mg  0.5 mg/kg Subcutaneous Q24H Lorretta Harp, MD   50 mg at 02/15/20 2129  . hydrALAZINE (APRESOLINE) injection 5 mg  5 mg Intravenous Q2H PRN Lorretta Harp, MD      . insulin aspart (novoLOG) injection 0-5 Units  0-5 Units Subcutaneous QHS Lorretta Harp, MD      . insulin aspart (novoLOG) injection 0-9 Units  0-9 Units Subcutaneous TID WC Lorretta Harp, MD   1 Units at 02/16/20 [phone]  . ipratropium-albuterol (DUONEB) 0.5-2.5 (3) MG/3ML nebulizer solution 3 mL  3 mL Nebulization Q6H Lorretta Harp, MD   3 mL at 02/16/20 0752  . multivitamin (RENA-VIT) tablet 1 tablet  1 tablet Oral Daily Lorretta Harp, MD   1 tablet at 02/16/20 0855  . multivitamin with minerals tablet 1 tablet  1 tablet Oral Daily Lorretta Harp, MD   1 tablet at 02/16/20 0855  . ondansetron (ZOFRAN) injection 4 mg  4 mg Intravenous Q8H PRN Ivor Costa, MD      . pantoprazole (PROTONIX) EC tablet 40 mg  40 mg Oral Daily Ivor Costa, MD   40 mg at 02/16/20 [id number]    Facility-Administered Medications Ordered in Other Encounters  Medication Dose Route Frequency Provider Last Rate Last Admin  . sodium chloride flush (NS) 0.9 % injection 10 mL  10 mL Intravenous PRN Cammie Sickle, MD   10 mL at 09/11/15 0845    Allergies as of 02/14/2020 - Review Complete 01/05/2020  Allergen Reaction Noted  . Amoxicillin Swelling 07/18/2007  . Fluconazole Rash   . Difuleron [ferrous fumarate-dss]  12/08/2017  . Other Other (See Comments) 05/10/2017     Review of Systems:    All systems reviewed and negative except where noted in HPI.  Review of Systems  Constitutional: Positive for malaise/fatigue.  Respiratory: Positive for cough.   Cardiovascular: Negative for chest pain.  Gastrointestinal: Negative for abdominal pain, nausea and vomiting.  Skin: Negative for rash.  Psychiatric/Behavioral: Negative for substance abuse.  All other systems reviewed and are negative.     Physical Exam:  Vital signs in last 24 hours: Temp:  [98 F (36.7 C)-98.4 F (36.9 C)] 98.3 F (36.8 C) (12/31 0727) Pulse Rate:  [75-91] 75 (12/31 0727) Resp:  [16-18] 18 (12/31 0727) BP: (116-131)/(47-65) 131/65 (12/31 0727) SpO2:  [95 %-98 %] 95 % (12/31 0727) FiO2 (%):  [2 %] 2 % (12/31 0120) Last BM Date: 02/16/20 (per pt report) General:   Pleasant in NAD Head:  Normocephalic and atraumatic. Eyes:   No icterus.   Conjunctiva pink. Mouth: Mucosa pink moist, no lesions. Neck:  Supple; no masses felt Lungs:  No respiratory distress Abdomen:   Flat, soft, nondistended, nontender Msk:  MAEW x4, No clubbing or cyanosis. Neurologic:  Alert and  oriented x4;  Cranial nerves II-XII intact.  Skin:  Warm, dry, pink without significant lesions or rashes. Psych:  Alert and cooperative. Normal affect.  LAB RESULTS: Recent Labs    02/14/20 2111 02/16/20 0457  WBC 11.7* 6.9  HGB 10.8* 9.2*  HCT 33.8* 29.8*  PLT 209 171   BMET Recent Labs    02/14/20 2111  02/16/20 0457  NA 136 137  K 4.2 3.6  CL 101 98  CO2 24 29  GLUCOSE 133* 193*  BUN 23 20  CREATININE 1.16* 0.95  CALCIUM 9.7 8.8*   LFT No results for input(s): PROT, ALBUMIN, AST, ALT, ALKPHOS, BILITOT, BILIDIR, IBILI in the last 72 hours. PT/INR No results for input(s): LABPROT, INR in the last 72 hours.  STUDIES: DG Chest 2 View  Result Date: 02/14/2020 CLINICAL DATA:  Cough, shortness of breath, hypoxia, and fever since yesterday. EXAM: CHEST - 2 VIEW COMPARISON:  01/05/2020 FINDINGS: Heart size and pulmonary vascularity are normal. Patchy airspace disease suggested in the left mid and lower lung with peribronchial thickening. Changes may represent bronchitis or multifocal pneumonia. No pleural effusions. No pneumothorax. Mediastinal contours appear intact. Calcification of the aorta. IMPRESSION: Patchy airspace disease in the left mid and lower lung with peribronchial thickening suggesting bronchitis or multifocal pneumonia. Electronically Signed   By: Lucienne Capers M.D.   On: 02/14/2020 21:31   CT HEAD WO CONTRAST  Result Date: 02/15/2020 CLINICAL DATA:  Mental status change, pneumonia EXAM: CT HEAD WITHOUT CONTRAST TECHNIQUE: Contiguous axial images were obtained from the base of the skull through the vertex without intravenous contrast. COMPARISON:  Correlation made with 2019 MRI brain  FINDINGS: Brain: There is no acute intracranial hemorrhage, mass effect, or edema. Gray-white differentiation is preserved. There is no extra-axial fluid collection. Ventricles and sulci are within normal limits in size and configuration. Vascular: There is atherosclerotic calcification at the skull base. Skull: Calvarium is unremarkable. Sinuses/Orbits: Evidence of prior sinonasal surgery with primarily ethmoid and sphenoid sinus mucosal thickening. Orbits are unremarkable. Other: None. IMPRESSION: No acute intracranial abnormality. Electronically Signed   By: Guadlupe SpanishPraneil  Patel M.D.   On: 02/15/2020  12:17   DG Swallowing Func-Speech Pathology  Result Date: 02/15/2020 Objective Swallowing Evaluation: Type of Study: MBS-Modified Barium Swallow Study  Patient Details Name: Pearletha AlfredDeborah H Ferdig MRN: [id number] Date of Birth: [birth year] Today's Date: 02/15/2020 Time: SLP Start Time (ACUTE ONLY): 1515 -SLP Stop Time (ACUTE ONLY): 1545 SLP Time Calculation (min) (ACUTE ONLY): 30 min Past Medical History: Past Medical History: Diagnosis Date . Anemia   iron deficiency and B12 deficiency . Anxiety  . Breast cancer ([facility]) 02/25/2015  upper-outer quadrant of left female breast, Triple Negative. Neo-adjuvant chemo with complete pathologic response.  . Cervical dysplasia   conization . Diabetes mellitus without complication ([facility])  . Diverticulosis  . Fever 04/11/2015 . Gastroparesis   related to previous radiation tx . GERD (gastroesophageal reflux disease)  . Hyperlipidemia  . Hypertension  . Neuropathy   legs/feet, S/P chemo meds . Personal history of chemotherapy  . Personal history of radiation therapy 2017  LEFT lumpectomy w/ radiation . RLS (restless legs syndrome)  . Shingles   Hx of . 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[facility]) 2006 . Wears dentures   partial bottom Past Surgical History: Past Surgical History: Procedure Laterality Date . APPENDECTOMY   . BREAST BIOPSY Left 02/25/2015  INVASIVE MAMMARY CARCINOMA,Triple negative. [name]. BREAST CYST ASPIRATION   . BREAST EXCISIONAL BIOPSY Left 12/2015  surgery . BREAST LUMPECTOMY WITH NEEDLE LOCALIZATION Left 10/02/2015  Procedure: BREAST LUMPECTOMY WITH NEEDLE LOCALIZATION;  Surgeon: Earline MayotteJeffrey W Byrnett, MD;  Location: [facility];  Service: General;  Laterality: Left; . BREAST LUMPECTOMY WITH SENTINEL LYMPH NODE BIOPSY Left 10/02/2015  Procedure: LEFT BREAST WIDE EXCISION WITH SENTINEL LYMPH NODE BX;  Surgeon: Earline MayotteJeffrey W Byrnett, MD;  Location: [facility];  Service: General;  Laterality: Left; . BREAST SURGERY    breast biopsy benign . CARPAL TUNNEL RELEASE Right 05/19/2017  Procedure: CARPAL TUNNEL  RELEASE;  Surgeon: Deeann SaintMiller, Howard, MD;  Location: [facility];  Service: Orthopedics;  Laterality: Right; . CATARACT EXTRACTION W/PHACO Right 10/26/2018  Procedure: CATARACT EXTRACTION PHACO AND INTRAOCULAR LENS PLACEMENT ([facility]) right diabetic;  Surgeon: Lockie MolaBrasington, Chadwick, MD;  [facility];  Service: Ophthalmology;  Laterality: Right;  Diabetic - oral meds cancer center been notified and will come . CATARACT EXTRACTION W/PHACO Left 11/16/2018  Procedure: CATARACT EXTRACTION PHACO AND INTRAOCULAR LENS PLACEMENT ([facility]) LEFT DIABETIC  01:01.0  17.0%  10.37;  Surgeon: Lockie MolaBrasington, Chadwick, MD;  [facility];  Service: Ophthalmology;  Laterality: Left;  Diabetic - oral meds Port-a-cath . CHOLECYSTECTOMY   . Cologuard  07/22/2016  Negative . ESOPHAGOGASTRODUODENOSCOPY  07/2009  normal with few gastric polyps . MOLE REMOVAL   . PORT-A-CATH REMOVAL   . PORTACATH PLACEMENT   . PORTACATH PLACEMENT Right 03/12/2015  Procedure: INSERTION PORT-A-CATH;  Surgeon: Earline MayotteJeffrey W Byrnett, MD;  Location: [facility];  Service: General;  Laterality: Right; . RADICAL NECK DISSECTION   . TONSILLECTOMY    cancer treated with chemo and radiation . TRIGGER FINGER RELEASE Right 05/19/2017  Procedure: RELEASE TRIGGER FINGER/A-1 PULLEY ring finger;  Surgeon: Hyacinth MeekerMiller,  Nadara Mustard, MD;  Location: [facility];  Service: Orthopedics;  Laterality: Right; HPI: Pt is a 74 y.o. 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  Pt's recent status is complicated by chronic cough and several weekly episodes of aspiration (chronic per MBSS in 07/2019 which revealed aspiration w/ all consistencies, also impacted by pharyngeal residue), and she presents for evaluation of shortness of breath. Pt reports that she has been feeling sick for the last 4 to 5 days. Fevers at home. Started feeling  short of breath yesterday. Has had a cough but she is not sure if this is different than her chronic cough. Has been feeling extremely fatigued and weak with difficulty walking today.  Pt self reported episodes of Regurgitation d/t feelings of foods "getting stuck here"(pointed to her breastbone area).  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 Overall function appears somewhat improved compared with MBS in June 2021, which showed silent aspiration of thin and nectar thick liquids, intermittently. Patient did aspirate after the swallow with larger sips of thin and nectar, and when performing liquid wash of solid, however these aspiration events elicited a protective cough response. Oral phase is note for xerostomia, and impaired bolus control with premature spillage of liquids to the pyriform sinuses before swallow initiation. Pharyngeal stage is characterized by reduced base of tongue retraction, resulting in residue (mild valleculae with thin, moderate with thicker liquids and solids in the valleculae, pyriforms, and along the posterior pharyngeal wall). Epiglottic inversion and airway closure is incomplete. With larger sips, liquid fills the pyriforms and spills into the laryngeal vestibule during and after the swallow, with eventual aspiration, x2. Cough response cleared the airway.   Hyolaryngeal excursion is reduced, which appears to contribute to reduced amplitude and duration of cricopharyngeal opening. Presence of CP bar noted; combined with possibly altered pharyngeal pressures, also impacts pharyngeal stripping of the bolus. Several compensatory maneuvers were attempted including dry swallow, liquids wash, chin tuck with dry swallow, and  super-supraglottic maneuver. Aspiration risk is judged to be moderate at this time, given active pneumonia. The safest diet at this time appears to be thin liquids and puree, and meds crushed in puree with use of compensations. Recommend small bites and sips. Pt should alternate solids with liquids. Super-supraglottic maneuver is recommended (breath hold, swallow, immediate cough, swallow again), particularly with larger sips and when following a solid bolus, multiple swallows, throat clearing intermittently throughout meal. With resolution of acute illness it would be reasonable for patient to advance solids with continued use of the above precautions. Patient was educated regarding these findings and recommendations for strict, thorough oral care to minimize oral bacteria load. SLP to follow. SLP Visit Diagnosis Dysphagia, oropharyngeal phase (R13.12);Dysphagia, pharyngeal phase (R13.13);Dysphagia, pharyngoesophageal phase (R13.14) Attention and concentration deficit following -- Frontal lobe and executive function deficit following -- Impact on safety and function Moderate aspiration risk;Risk for inadequate nutrition/hydration   CHL IP TREATMENT RECOMMENDATION 02/15/2020 Treatment Recommendations Therapy as outlined in treatment plan below   Prognosis 02/15/2020 Prognosis for Safe Diet Advancement Fair Barriers to Reach Goals Time post onset;Severity of deficits Barriers/Prognosis Comment -- CHL IP DIET RECOMMENDATION 02/15/2020 SLP Diet Recommendations Dysphagia 1 (Puree) solids;Thin liquid Liquid  Administration via Cup Medication Administration Crushed with puree Compensations Slow rate;Small sips/bites;Follow solids with liquid;Multiple dry swallows after each bite/sip;Hard cough after swallow;Effortful swallow;Clear throat intermittently Postural Changes Seated upright at 90 degrees;Remain semi-upright after after feeds/meals (Comment)   CHL IP OTHER RECOMMENDATIONS 02/15/2020 Recommended Consults Consider  esophageal assessment Oral Care Recommendations Oral care QID Other Recommendations (No Data)   CHL IP FOLLOW UP RECOMMENDATIONS 02/15/2020 Follow up Recommendations Other (comment)   CHL IP FREQUENCY AND DURATION 02/15/2020 Speech Therapy Frequency (ACUTE ONLY) min 2x/week Treatment Duration 2 weeks      CHL IP ORAL PHASE 02/15/2020 Oral Phase Impaired Oral - Pudding Teaspoon -- Oral - Pudding Cup -- Oral - Honey Teaspoon -- Oral - Honey Cup -- Oral - Nectar Teaspoon -- Oral - Nectar Cup Premature spillage Oral - Nectar Straw -- Oral - Thin Teaspoon Premature spillage Oral - Thin Cup Premature spillage Oral - Thin Straw -- Oral - Puree Premature spillage Oral - Mech Soft Impaired mastication;Weak lingual manipulation Oral - Regular -- Oral - Multi-Consistency -- Oral - Pill -- Oral Phase - Comment --  CHL IP PHARYNGEAL PHASE 02/15/2020 Pharyngeal Phase Impaired Pharyngeal- Pudding Teaspoon -- Pharyngeal -- Pharyngeal- Pudding Cup -- Pharyngeal -- Pharyngeal- Honey Teaspoon -- Pharyngeal -- Pharyngeal- Honey Cup -- Pharyngeal -- Pharyngeal- Nectar Teaspoon -- Pharyngeal -- Pharyngeal- Nectar Cup Penetration/Aspiration during swallow;Penetration/Apiration after swallow;Moderate aspiration;Pharyngeal residue - valleculae;Pharyngeal residue - pyriform;Pharyngeal residue - posterior pharnyx;Delayed swallow initiation-pyriform sinuses;Reduced epiglottic inversion;Reduced anterior laryngeal mobility;Reduced airway/laryngeal closure;Reduced tongue base retraction Pharyngeal Material enters airway, CONTACTS cords and then ejected out;Material enters airway, passes BELOW cords then ejected out Pharyngeal- Nectar Straw -- Pharyngeal -- Pharyngeal- Thin Teaspoon Delayed swallow initiation-pyriform sinuses;Reduced epiglottic inversion;Reduced anterior laryngeal mobility;Reduced airway/laryngeal closure;Reduced tongue base retraction;Pharyngeal residue - valleculae Pharyngeal Material does not enter airway Pharyngeal- Thin Cup  Delayed swallow initiation-pyriform sinuses;Reduced epiglottic inversion;Reduced anterior laryngeal mobility;Reduced airway/laryngeal closure;Reduced tongue base retraction;Penetration/Aspiration during swallow;Penetration/Apiration after swallow;Trace aspiration;Pharyngeal residue - valleculae Pharyngeal Material does not enter airway;Material enters airway, CONTACTS cords and then ejected out;Material enters airway, passes BELOW cords then ejected out Pharyngeal- Thin Straw -- Pharyngeal -- Pharyngeal- Puree Reduced epiglottic inversion;Reduced anterior laryngeal mobility;Reduced airway/laryngeal closure;Reduced tongue base retraction;Penetration/Apiration after swallow;Pharyngeal residue - valleculae;Pharyngeal residue - pyriform;Pharyngeal residue - posterior pharnyx;Pharyngeal residue - cp segment Pharyngeal Material enters airway, passes BELOW cords then ejected out;Material does not enter airway Pharyngeal- Mechanical Soft Reduced epiglottic inversion;Reduced anterior laryngeal mobility;Reduced airway/laryngeal closure;Reduced tongue base retraction;Penetration/Apiration after swallow;Moderate aspiration;Pharyngeal residue - valleculae;Pharyngeal residue - pyriform;Pharyngeal residue - posterior pharnyx;Pharyngeal residue - cp segment Pharyngeal Material enters airway, passes BELOW cords then ejected out;Material enters airway, remains ABOVE vocal cords then ejected out Pharyngeal- Regular -- Pharyngeal -- Pharyngeal- Multi-consistency -- Pharyngeal -- Pharyngeal- Pill -- Pharyngeal -- Pharyngeal Comment --  CHL IP CERVICAL ESOPHAGEAL PHASE 02/15/2020 Cervical Esophageal Phase Impaired Pudding Teaspoon -- Pudding Cup -- Honey Teaspoon -- Honey Cup -- Nectar Teaspoon -- Nectar Cup Reduced cricopharyngeal relaxation;Prominent cricopharyngeal segment Nectar Straw -- Thin Teaspoon Reduced cricopharyngeal relaxation;Prominent cricopharyngeal segment Thin Cup Reduced cricopharyngeal relaxation;Prominent  cricopharyngeal segment Thin Straw -- Puree Reduced cricopharyngeal relaxation;Prominent cricopharyngeal segment Mechanical Soft Reduced cricopharyngeal relaxation;Prominent cricopharyngeal segment Regular -- Multi-consistency -- Pill -- Cervical Esophageal Comment -- Rondel Baton, MS, CCC-SLP Speech-Language Pathologist Arlana Lindau 02/15/2020, 5:12 PM                  Impression / Plan:   74 y/o lady with history of a head and neck cancer with oropharyngeal  dysphagia after treatment here with pneumonia possibly from aspiration. We have been asked for esophageal evaluation which is reasonable but given new oxygen requirement and ongoing treatment for pneumonia will defer EGD at this time.   - if patient is still here early next week then can consider EGD at that time but it's also completely reasonable to perform EGD as an outpatient after completion of treatment for her pneumonia  Will follow peripherally. Please call with any questions or concerns.  Raylene Miyamoto MD, MPH [place]

## 2020-02-16 NOTE — Consult Note (Signed)
Pharmacy Antibiotic Note  Suzanne Strong is a 74 y.o. female admitted on 02/15/2020  Pharmacy has been consulted for Unasyn dosing for aspiration PNA/CAP  Plan:  Given patient severe allergy to amoxicillin, will continue to use higher generation cephalopsorin ceftriaxone since documented tolerance to this medication  Resume ceftriaxone 1 gram daily    Height: 5\' 5"  (165.1 cm) Weight: 101 kg (222 lb 10.6 oz) IBW/kg (Calculated) : 57  Temp (24hrs), Avg:98.4 F (36.9 C), Min:98.3 F (36.8 C), Max:98.4 F (36.9 C)  Recent Labs  Lab 02/14/20 2111 02/15/20 0606 02/15/20 1052 02/16/20 0457  WBC 11.7*  --   --  6.9  CREATININE 1.16*  --   --  0.95  LATICACIDVEN  --  1.4 1.8  --     Estimated Creatinine Clearance: 63 mL/min (by C-G formula based on SCr of 0.95 mg/dL).    Allergies  Allergen Reactions  . Amoxicillin Swelling    REACTION: rash, swelling Has patient had a PCN reaction causing immediate rash, facial/tongue/throat swelling, SOB or lightheadedness with hypotension: yes Has patient had a PCN reaction causing severe rash involving mucus membranes or skin necrosis: no Has patient had a PCN reaction that required hospitalization: no Has patient had a PCN reaction occurring within the last 10 years: yes If all of the above answers are "NO", then may proceed with Cephalosporin use.   02/18/20 Fluconazole Rash    REACTION: hives  . Difuleron [Ferrous Fumarate-Dss]   . 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  1/31, PharmD, BCPS Clinical Pharmacist  02/16/2020 1:10 PM

## 2020-02-16 NOTE — Evaluation (Signed)
Physical Therapy Evaluation Patient Details Name: Suzanne Strong MRN: [id number] DOB: March 11, 1945 Today's Date: 02/16/2020   History of Present Illness  Pt is a 74 y/o F who was admitted on 02/15/20 after presenting from home with cough, SOB & fever. PMH: HTN, HLD, DM, GERD, depression, anxiety, tonsillar CA, RLS, L breast CA (s/p lumpectomy, radiation & chemo), gastroparesis, anemia, neuropathy  Clinical Impression  Prior to admission pt was independent without AD. Pt seen for PT evaluation today & initially requires CGA for gait without but progresses to mod I without AD while ambulating increased distances. Pt completes toilet transfer & hand hygiene during session without assistance. Pt requires supplemental oxygen to maintain >90% oxygen saturation during session, as noted below. At this time pt is mod I with transfers & gait without AD & pt reports she's at her baseline level of function. Pt does not require PT services at this time.  At rest on 2L/min: 98% At rest on room air: 94% During ambulation on room air 91%, but after ambulating a total of 170 ft SpO2 dropped to 85% (pt denies SOB) & PT educates pt on pursed lip breathing & pt placed back on 2L/min via nasal cannula. Pt ambulates 330 ft on 2L/min & SpO2 94% or greater throughout.  Pt left on 2L/min.     Follow Up Recommendations No PT follow up    Equipment Recommendations  None recommended by PT    Recommendations for Other Services       Precautions / Restrictions Precautions Precautions: None Restrictions Weight Bearing Restrictions: No      Mobility  Bed Mobility               General bed mobility comments: not observed, pt received & left sitting in recliner    Transfers Overall transfer level: Modified independent               General transfer comment: sit<>stand from recliner & toilet mod I (armrests, grab bar)  Ambulation/Gait Ambulation/Gait assistance:  (CGA progress to mod I) Gait  Distance (Feet):  (170 ft + 330 ft) Assistive device: None Gait Pattern/deviations: Decreased step length - left;Decreased step length - right;Decreased stride length Gait velocity: decreasd      Stairs            Wheelchair Mobility    Modified Rankin (Stroke Patients Only)       Balance Overall balance assessment: Needs assistance         Standing balance support: No upper extremity supported;During functional activity Standing balance-Leahy Scale: Good Standing balance comment: mod I gait without AD without overt LOB noted                             Pertinent Vitals/Pain Pain Assessment: No/denies pain    Home Living Family/patient expects to be discharged to:: Private residence Living Arrangements: Alone Available Help at Discharge: Family;Friend(s);Available PRN/intermittently Type of Home: House Home Access: Level entry (small threshold step to enter home)     Home Layout: One level Home Equipment: None      Prior Function Level of Independence: Independent         Comments: driving     Hand Dominance        Extremity/Trunk Assessment   Upper Extremity Assessment Upper Extremity Assessment: Overall WFL for tasks assessed    Lower Extremity Assessment Lower Extremity Assessment: Overall WFL for tasks assessed  Communication   Communication: No difficulties  Cognition Arousal/Alertness: Awake/alert Behavior During Therapy: WFL for tasks assessed/performed Overall Cognitive Status: Within Functional Limits for tasks assessed                                        General Comments      Exercises     Assessment/Plan    PT Assessment Patent does not need any further PT services  PT Problem List         PT Treatment Interventions      PT Goals (Current goals can be found in the Care Plan section)  Acute Rehab PT Goals Patient Stated Goal: go home PT Goal Formulation: With patient Time  For Goal Achievement: 03/01/20 Potential to Achieve Goals: Good    Frequency     Barriers to discharge        Co-evaluation               AM-PAC PT "6 Clicks" Mobility  Outcome Measure Help needed turning from your back to your side while in a flat bed without using bedrails?: None Help needed moving from lying on your back to sitting on the side of a flat bed without using bedrails?: None Help needed moving to and from a bed to a chair (including a wheelchair)?: None Help needed standing up from a chair using your arms (e.g., wheelchair or bedside chair)?: None Help needed to walk in hospital room?: None Help needed climbing 3-5 steps with a railing? : None 6 Click Score: 24    End of Session Equipment Utilized During Treatment: Gait belt;Oxygen Activity Tolerance: Patient tolerated treatment well Patient left: in chair;with call bell/phone within reach Nurse Communication: Mobility status (O2)      Time: 8937-3428 PT Time Calculation (min) (ACUTE ONLY): 23 min   Charges:   PT Evaluation $PT Eval Low Complexity: 1 Low PT Treatments $Therapeutic Activity: 8-22 mins        Aleda Grana, PT, DPT 02/16/20, 10:13 AM   Sandi Mariscal 02/16/2020, 10:11 AM

## 2020-02-16 NOTE — Progress Notes (Signed)
OT Cancellation Note  Patient Details Name: Suzanne Strong MRN: [id number] DOB: 03-14-1945   Cancelled Treatment:    Reason Eval/Treat Not Completed: OT screened, no needs identified, will sign off. Order received and chart reviewed. Pt seen in her room, she reports feeling at or near baseline level of functional independence for ADL management despite ongoing need for supplemental O2. Per PT note pt performs functional mobility without a device. 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 OT reviews energy conservation handout with pt and discusses considerations for O2 safety in the home. Pt return verbalizes understanding of information provided. No further skilled OT needs identified. Will sign off at this time. No charge. Please re-consult if additional OT needs arise during this admission.   Rockney Ghee, M.S., OTR/L Ascom: [phone] 02/16/20, 1:09 PM

## 2020-02-17 DIAGNOSIS — J690 Pneumonitis due to inhalation of food and vomit: Secondary | ICD-10-CM | POA: Diagnosis not present

## 2020-02-17 LAB — CBC WITH DIFFERENTIAL/PLATELET
Abs Immature Granulocytes: 0.02 10*3/uL (ref 0.00–0.07)
Basophils Absolute: 0 10*3/uL (ref 0.0–0.1)
Basophils Relative: 0 %
Eosinophils Absolute: 0.1 10*3/uL (ref 0.0–0.5)
Eosinophils Relative: 2 %
HCT: 32.9 % — ABNORMAL LOW (ref 36.0–46.0)
Hemoglobin: 10.5 g/dL — ABNORMAL LOW (ref 12.0–15.0)
Immature Granulocytes: 0 %
Lymphocytes Relative: 15 %
Lymphs Abs: 0.8 10*3/uL (ref 0.7–4.0)
MCH: 28 pg (ref 26.0–34.0)
MCHC: 31.9 g/dL (ref 30.0–36.0)
MCV: 87.7 fL (ref 80.0–100.0)
Monocytes Absolute: 0.4 10*3/uL (ref 0.1–1.0)
Monocytes Relative: 7 %
Neutro Abs: 4.3 10*3/uL (ref 1.7–7.7)
Neutrophils Relative %: 76 %
Platelets: 231 10*3/uL (ref 150–400)
RBC: 3.75 MIL/uL — ABNORMAL LOW (ref 3.87–5.11)
RDW: 14.6 % (ref 11.5–15.5)
WBC: 5.7 10*3/uL (ref 4.0–10.5)
nRBC: 0 % (ref 0.0–0.2)

## 2020-02-17 LAB — BASIC METABOLIC PANEL
Anion gap: 12 (ref 5–15)
BUN: 14 mg/dL (ref 8–23)
CO2: 27 mmol/L (ref 22–32)
Calcium: 9.1 mg/dL (ref 8.9–10.3)
Chloride: 100 mmol/L (ref 98–111)
Creatinine, Ser: 0.8 mg/dL (ref 0.44–1.00)
GFR, Estimated: >60 mL/min (ref >60)
Glucose, Bld: 148 mg/dL — ABNORMAL HIGH (ref 70–99)
Potassium: 3.9 mmol/L (ref 3.5–5.1)
Sodium: 139 mmol/L (ref 135–145)

## 2020-02-17 LAB — GLUCOSE, CAPILLARY
Glucose-Capillary: 133 mg/dL — ABNORMAL HIGH (ref 70–99)
Glucose-Capillary: 158 mg/dL — ABNORMAL HIGH (ref 70–99)

## 2020-02-17 MED ORDER — DM-GUAIFENESIN ER 30-600 MG PO TB12: 1.0000 | ORAL_TABLET | Freq: Two times a day (BID) | ORAL | 0 refills | Status: AC | PRN

## 2020-02-17 MED ORDER — CEFDINIR 300 MG PO CAPS: 300.0000 mg | ORAL_CAPSULE | Freq: Two times a day (BID) | ORAL | 0 refills | Status: AC

## 2020-02-17 MED ORDER — PREGABALIN 75 MG PO CAPS: 75.0000 mg | ORAL_CAPSULE | Freq: Every day | ORAL | 1 refills | Status: DC

## 2020-02-17 MED ORDER — AMLODIPINE BESYLATE 5 MG PO TABS: 5.0000 mg | ORAL_TABLET | Freq: Every day | ORAL | 1 refills | Status: DC

## 2020-02-17 MED ORDER — AMLODIPINE BESYLATE 5 MG PO TABS
5.0000 mg | ORAL_TABLET | Freq: Every day | ORAL | Status: DC
  Administered 2020-02-17: 08:00:00 5 mg via ORAL
  Filled 2020-02-17: qty 1

## 2020-02-17 MED ORDER — PREGABALIN 75 MG PO CAPS: 150.0000 mg | ORAL_CAPSULE | Freq: Every day | ORAL | 1 refills | Status: DC

## 2020-02-17 NOTE — Progress Notes (Signed)
SATURATION QUALIFICATIONS: (This note is used to comply with regulatory documentation for home oxygen)  Patient Saturations on Room Air at Rest =96%  Patient Saturations on Room Air while Ambulating = 91%  Patient Saturations on 0 Liters of oxygen while Ambulating = 91%  Please briefly explain why patient needs home oxygen: Does not qualify for home oxygen at this time.

## 2020-02-17 NOTE — Discharge Summary (Addendum)
Physician Discharge Summary  Suzanne Strong [id number] DOB: 09/04/1945 DOA: 02/15/2020  PCP: [name], MD  Admit date: 02/15/2020 Discharge date: 03/03/2020  Admitted From: Home Disposition:  Home  Discharge Condition:Stable CODE STATUS:FULL Diet recommendation:  Dysphagia 1  Brief/Interim Summary:  Patient is a 75 year old female with history of hypertension, hyperlipidemia, diabetes type 2, GERD, depression/anxiety, tonsillar cancer, restless leg syndrome, left breast cancer, gastroparesis, anemia who presents with complaints of cough, shortness of breath and fever. She was feeling very weak, also complained of intermittent chronic diarrhea. On presentation she was hypoxic on room air which improved with 2 L of oxygen. Covid PCR negative. She had mild elevated leukocytes on presentation. Chest x-ray showed patchy infiltrate in the left middle lobe and left lower lobe. Patient was admitted for management of multifocal pneumonia most likely from aspiration. Speech therapy consulted. Recommended dysphagia diet. Her respiratory status has improved and she is maintaining her saturation on room air. She is medically stable for discharge to home today.  Following problems were addressed during hospitalization:   Acute respiratory with hypoxia: Presented with dyspnea, weakness, hypoxia. Currently respiratory status stable. Maintaining saturation on room air.  Aspiration pneumonia: Presented with fever, cough. Chest x-ray as above. Started on ceftriaxone, azithromycin and Flagyl.Blood cultures, sputum cultures have not shown any growth. Speech therapy following and recommended dysphagia 1 diet. GI was also consulted during this hospitalization. GI wants to follow her as an outpatient for possible EGD for evaluation of dysphagia Abx changed to oral. Sepsis ruled out.  Altered mental status: Most likely from acute metabolic encepaholpathy from pneumonia. CT head did not show  any  acute intracranial normalities. Polypharmacy could also have contributed. Currently she is alert and oriented.Dose of lyrica reduced  Tonsillar cancer/breast cancer: Status post lumpectomy, radiation and chemotherapy. We recommend follow-up with oncology as an outpatient  Hyperlipidemia: Continue Lipitor  Depression/anxiety: On Xanax, amitriptyline, BuSpar, Requip, Zoloft, trazodone at home .Also on Suvorezant and Lyrica. Patient was confused on presentation. Could be from polypharmacy. Dose of Lyrica reduced  Diabetes type 2: A1c of 8, poorly controlled. On glipizide, Janumet at home. Monitor blood sugars at home. Resume home regimen on discharge  GERD: Continue Protonix  Hypertension:  Started on amlodipine. As per her, her PCP has started on losartan.  AKI: Resolved.Mild elevated creatinine on presentation. Her baseline creatinine is normal.   Mild elevated troponin: Denies any chest pain. Likely due to demand ischemia. No intervention planned   Discharge Diagnoses:  Principal Problem:   Aspiration pneumonia ([place]) Active Problems:   Hyperlipidemia   Depression with anxiety   Diabetes mellitus without complication (HCC)   GERD (gastroesophageal reflux disease)   Hypertension   AKI (acute kidney injury) ([place])   Elevated troponin   Acute respiratory failure with hypoxia (HCC)   Acute metabolic encephalopathy    Discharge Instructions  Discharge Instructions    Diet general   Complete by: As directed    Dysphagia 1 diet   Discharge instructions   Complete by: As directed    1)Please follow up with your PCP in a week 2)Take prescribed medications as instructed 3) while eating, take small bites.  Always stay upright on eating and stay upright for next 30 minutes to 1 hour after eating. 4)Monitor your blood pressure at home   Increase activity slowly   Complete by: As directed      Allergies as of 02/17/2020      Reactions   Amoxicillin Swelling   REACTION:  rash, swelling Has patient had a PCN reaction causing immediate rash, facial/tongue/throat swelling, SOB or lightheadedness with hypotension: yes Has patient had a PCN reaction causing severe rash involving mucus membranes or skin necrosis: no Has patient had a PCN reaction that required hospitalization: no Has patient had a PCN reaction occurring within the last 10 years: yes If all of the above answers are "NO", then may proceed with Cephalosporin use.   Fluconazole Rash   REACTION: hives   Difuleron [ferrous Fumarate-dss]    Other Other (See Comments)   Pt has had tonsil cancer (on Left side) - pt may have difficulty swallowing      Medication List    STOP taking these medications   chlorpheniramine-HYDROcodone 10-8 MG/5ML Suer Commonly known as: Tussionex Pennkinetic ER   MELOXICAM PO     TAKE these medications   albuterol 108 (90 Base) MCG/ACT inhaler Commonly known as: VENTOLIN HFA INHALE TWO PUFFS BY MOUTH EVERY SIX HOURS AS NEEDED   ALPRAZolam 0.5 MG tablet Commonly known as: XANAX Take 1 tablet (0.5 mg total) by mouth 2 (two) times daily as needed for anxiety or sleep.   amitriptyline 50 MG tablet Commonly known as: ELAVIL Take 1 tablet (50 mg total) by mouth at bedtime.   amLODipine 5 MG tablet Commonly known as: NORVASC Take 1 tablet (5 mg total) by mouth daily.   atorvastatin 10 MG tablet Commonly known as: LIPITOR TAKE 1 TABLET(10 MG) BY MOUTH DAILY   b complex vitamins tablet Take 1 tablet by mouth daily.   Belsomra 20 MG Tabs Generic drug: Suvorexant Take 20 mg by mouth at bedtime.   busPIRone 15 MG tablet Commonly known as: BUSPAR Take 1 tablet (15 mg total) by mouth 2 (two) times daily.   glipiZIDE 10 MG tablet Commonly known as: GLUCOTROL Take 10 mg by mouth 2 (two) times daily before a meal.   glucose blood test strip To check blood glucose once daily and prn for diabetes   Janumet XR 50-1000 MG Tb24 Generic drug: SitaGLIPtin-MetFORMIN  HCl Take 1 tablet by mouth 2 (two) times daily.   lidocaine-prilocaine cream Commonly known as: EMLA Apply 1 application topically as needed. Apply to port when going through Chemo   multivitamin tablet Take 1 tablet by mouth daily.   omeprazole 20 MG capsule Commonly known as: PRILOSEC TAKE 1 CAPSULE(20 MG) BY MOUTH AT BEDTIME   pregabalin 75 MG capsule Commonly known as: LYRICA Take 1 capsule (75 mg total) by mouth daily. What changed:   medication strength  how much to take  when to take this   rOPINIRole 1 MG tablet Commonly known as: REQUIP TAKE 1/2 TABLET BY MOUTH EVERY MORNING and TAKE THREE TABLETS BY MOUTH EVERYDAY AT BEDTIME What changed:   how much to take  how to take this  when to take this   sertraline 100 MG tablet Commonly known as: ZOLOFT TAKE 2 TABLETS BY MOUTH EVERY DAY What changed:   how much to take  how to take this  when to take this   traZODone 100 MG tablet Commonly known as: DESYREL Take 100 mg by mouth at bedtime.   Vitamin D-1000 Max St 25 MCG (1000 UT) tablet Generic drug: Cholecalciferol Take 2,000 Units by mouth daily.   WOMENS MULTIVITAMIN PO Take by mouth.     ASK your doctor about these medications   cefdinir 300 MG capsule Commonly known as: OMNICEF Take 1 capsule (300 mg total) by mouth 2 (two) times  daily for 4 days. Ask about: Should I take this medication?   dextromethorphan-guaiFENesin 30-600 MG 12hr tablet Commonly known as: MUCINEX DM Take 1 tablet by mouth 2 (two) times daily as needed for up to 5 days for cough. Ask about: Should I take this medication?       Follow-up Information    Tower, [name], MD. Schedule an appointment as soon as possible for a visit in 1 week.   Specialties: Family Medicine, Radiology Why: Patient to make own follow up appt Contact information: [place] [address] [phone]              Allergies  Allergen Reactions  . Amoxicillin  Swelling    REACTION: rash, swelling Has patient had a PCN reaction causing immediate rash, facial/tongue/throat swelling, SOB or lightheadedness with hypotension: yes Has patient had a PCN reaction causing severe rash involving mucus membranes or skin necrosis: no Has patient had a PCN reaction that required hospitalization: no Has patient had a PCN reaction occurring within the last 10 years: yes If all of the above answers are "NO", then may proceed with Cephalosporin use.   [name] Fluconazole Rash    REACTION: hives  . Difuleron [Ferrous Fumarate-Dss]   . Other Other (See Comments)    Pt has had tonsil cancer (on Left side) - pt may have difficulty swallowing    Consultations:  GI   Procedures/Studies: DG Chest 2 View  Result Date: 02/14/2020 CLINICAL DATA:  Cough, shortness of breath, hypoxia, and fever since yesterday. EXAM: CHEST - 2 VIEW COMPARISON:  01/05/2020 FINDINGS: Heart size and pulmonary vascularity are normal. Patchy airspace disease suggested in the left mid and lower lung with peribronchial thickening. Changes may represent bronchitis or multifocal pneumonia. No pleural effusions. No pneumothorax. Mediastinal contours appear intact. Calcification of the aorta. IMPRESSION: Patchy airspace disease in the left mid and lower lung with peribronchial thickening suggesting bronchitis or multifocal pneumonia. Electronically Signed   By: Lucienne Capers M.D.   On: 02/14/2020 21:31   CT HEAD WO CONTRAST  Result Date: 02/15/2020 CLINICAL DATA:  Mental status change, pneumonia EXAM: CT HEAD WITHOUT CONTRAST TECHNIQUE: Contiguous axial images were obtained from the base of the skull through the vertex without intravenous contrast. COMPARISON:  Correlation made with 2019 MRI brain FINDINGS: Brain: There is no acute intracranial hemorrhage, mass effect, or edema. Gray-white differentiation is preserved. There is no extra-axial fluid collection. Ventricles and sulci are within normal  limits in size and configuration. Vascular: There is atherosclerotic calcification at the skull base. Skull: Calvarium is unremarkable. Sinuses/Orbits: Evidence of prior sinonasal surgery with primarily ethmoid and sphenoid sinus mucosal thickening. Orbits are unremarkable. Other: None. IMPRESSION: No acute intracranial abnormality. Electronically Signed   By: Macy Mis M.D.   On: 02/15/2020 12:17   DG Swallowing Func-Speech Pathology  Result Date: 02/15/2020 Objective Swallowing Evaluation: Type of Study: MBS-Modified Barium Swallow Study  Patient Details Name: DANICE STUVE MRN: [id number] Date of Birth: 1945-10-15 Today's Date: 02/15/2020 Time: SLP Start Time (ACUTE ONLY): 1515 -SLP Stop Time (ACUTE ONLY): 1545 SLP Time Calculation (min) (ACUTE ONLY): 30 min Past Medical History: Past Medical History: Diagnosis Date . Anemia   iron deficiency and B12 deficiency . Anxiety  . Breast cancer ([place]) 02/25/2015  upper-outer quadrant of left female breast, Triple Negative. Neo-adjuvant chemo with complete pathologic response.  . Cervical dysplasia   conization . Diabetes mellitus without complication ([place])  . Diverticulosis  . Fever 04/11/2015 .  Gastroparesis   related to previous radiation tx . GERD (gastroesophageal reflux disease)  . Hyperlipidemia  . Hypertension  . Neuropathy   legs/feet, S/P chemo meds . Personal history of chemotherapy  . Personal history of radiation therapy 2017  LEFT lumpectomy w/ radiation . RLS (restless legs syndrome)  . Shingles   Hx of . 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[place]) 2006 . Wears dentures   partial bottom Past Surgical History: Past Surgical History: Procedure Laterality Date . APPENDECTOMY   . BREAST BIOPSY Left 02/25/2015  INVASIVE MAMMARY CARCINOMA,Triple negative. [name] BREAST CYST ASPIRATION   . BREAST EXCISIONAL BIOPSY Left 12/2015  surgery . BREAST LUMPECTOMY WITH NEEDLE LOCALIZATION Left 10/02/2015  Procedure: BREAST LUMPECTOMY WITH NEEDLE LOCALIZATION;  Surgeon: [name],  MD;  Location: [facility];  Service: General;  Laterality: Left; . BREAST LUMPECTOMY WITH SENTINEL LYMPH NODE BIOPSY Left 10/02/2015  Procedure: LEFT BREAST WIDE EXCISION WITH SENTINEL LYMPH NODE BX;  Surgeon: [name], MD;  Location: [facility];  Service: General;  Laterality: Left; . BREAST SURGERY    breast biopsy benign . CARPAL TUNNEL RELEASE Right 05/19/2017  Procedure: CARPAL TUNNEL RELEASE;  Surgeon: Earnestine Leys, MD;  Location: [facility];  Service: Orthopedics;  Laterality: Right; . CATARACT EXTRACTION W/PHACO Right 10/26/2018  Procedure: CATARACT EXTRACTION PHACO AND INTRAOCULAR LENS PLACEMENT ([place]) right diabetic;  Surgeon: Leandrew Koyanagi, MD;  Location: [place];  Service: Ophthalmology;  Laterality: Right;  Diabetic - oral meds cancer center been notified and will come . CATARACT EXTRACTION W/PHACO Left 11/16/2018  Procedure: CATARACT EXTRACTION PHACO AND INTRAOCULAR LENS PLACEMENT ([facility]) LEFT DIABETIC  01:01.0  17.0%  10.37;  Surgeon: Leandrew Koyanagi, MD;  Location: [place];  Service: Ophthalmology;  Laterality: Left;  Diabetic - oral meds Port-a-cath . CHOLECYSTECTOMY   . Cologuard  07/22/2016  Negative . ESOPHAGOGASTRODUODENOSCOPY  07/2009  normal with few gastric polyps . MOLE REMOVAL   . PORT-A-CATH REMOVAL   . PORTACATH PLACEMENT   . PORTACATH PLACEMENT Right 03/12/2015  Procedure: INSERTION PORT-A-CATH;  Surgeon: [name], MD;  Location: [facility];  Service: General;  Laterality: Right; . RADICAL NECK DISSECTION   . TONSILLECTOMY    cancer treated with chemo and radiation . TRIGGER FINGER RELEASE Right 05/19/2017  Procedure: RELEASE TRIGGER FINGER/A-1 PULLEY ring finger;  Surgeon: Earnestine Leys, MD;  Location: [facility];  Service: Orthopedics;  Laterality: Right; HPI: Pt is a 75 y.o. female with a history of remote breast cancer, remote tonsillar cancer in 2006 w/ XRT and chronic PMH including: GERD, cervical dysplasia, Gastroparesis, HTN, DM, anemia,  anxiety, RLS, hyperlipidemia, Obesity.  Pt's recent status is complicated by chronic cough and several weekly episodes of aspiration (chronic per MBSS in 07/2019 which revealed aspiration w/ all consistencies, also impacted by pharyngeal residue), and she presents for evaluation of shortness of breath. Pt reports that she has been feeling sick for the last 4 to 5 days. Fevers at home. Started feeling short of breath yesterday. Has had a cough but she is not sure if this is different than her chronic cough. Has been feeling extremely fatigued and weak with difficulty walking today.  Pt self reported episodes of Regurgitation d/t feelings of foods "getting stuck here"(pointed to her breastbone area).  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 Overall function appears somewhat  improved compared with MBS in June 2021, which showed silent aspiration of thin and nectar thick liquids, intermittently. Patient did aspirate after the swallow with larger sips of thin and nectar, and when performing liquid wash of solid, however these aspiration events elicited a protective cough response. Oral phase is note for xerostomia, and impaired bolus control with premature spillage of liquids to the pyriform sinuses before swallow initiation. Pharyngeal stage is characterized by reduced base of tongue retraction, resulting in residue (mild valleculae with thin, moderate with thicker liquids and solids in the valleculae, pyriforms, and along the posterior pharyngeal wall). Epiglottic inversion and airway closure is incomplete. With larger sips, liquid fills the pyriforms and spills into the laryngeal vestibule during and after the swallow, with eventual aspiration, x2. Cough  response cleared the airway.   Hyolaryngeal excursion is reduced, which appears to contribute to reduced amplitude and duration of cricopharyngeal opening. Presence of CP bar noted; combined with possibly altered pharyngeal pressures, also impacts pharyngeal stripping of the bolus. Several compensatory maneuvers were attempted including dry swallow, liquids wash, chin tuck with dry swallow, and super-supraglottic maneuver. Aspiration risk is judged to be moderate at this time, given active pneumonia. The safest diet at this time appears to be thin liquids and puree, and meds crushed in puree with use of compensations. Recommend small bites and sips. Pt should alternate solids with liquids. Super-supraglottic maneuver is recommended (breath hold, swallow, immediate cough, swallow again), particularly with larger sips and when following a solid bolus, multiple swallows, throat clearing intermittently throughout meal. With resolution of acute illness it would be reasonable for patient to advance solids with continued use of the above precautions. Patient was educated regarding these findings and recommendations for strict, thorough oral care to minimize oral bacteria load. SLP to follow. SLP Visit Diagnosis Dysphagia, oropharyngeal phase (R13.12);Dysphagia, pharyngeal phase (R13.13);Dysphagia, pharyngoesophageal phase (R13.14) Attention and concentration deficit following -- Frontal lobe and executive function deficit following -- Impact on safety and function Moderate aspiration risk;Risk for inadequate nutrition/hydration   CHL IP TREATMENT RECOMMENDATION 02/15/2020 Treatment Recommendations Therapy as outlined in treatment plan below   Prognosis 02/15/2020 Prognosis for Safe Diet Advancement Fair Barriers to Reach Goals Time post onset;Severity of deficits Barriers/Prognosis Comment -- CHL IP DIET RECOMMENDATION 02/15/2020 SLP Diet Recommendations Dysphagia 1 (Puree) solids;Thin liquid Liquid Administration via Cup  Medication Administration Crushed with puree Compensations Slow rate;Small sips/bites;Follow solids with liquid;Multiple dry swallows after each bite/sip;Hard cough after swallow;Effortful swallow;Clear throat intermittently Postural Changes Seated upright at 90 degrees;Remain semi-upright after after feeds/meals (Comment)   CHL IP OTHER RECOMMENDATIONS 02/15/2020 Recommended Consults Consider esophageal assessment Oral Care Recommendations Oral care QID Other Recommendations (No Data)   CHL IP FOLLOW UP RECOMMENDATIONS 02/15/2020 Follow up Recommendations Other (comment)   CHL IP FREQUENCY AND DURATION 02/15/2020 Speech Therapy Frequency (ACUTE ONLY) min 2x/week Treatment Duration 2 weeks      CHL IP ORAL PHASE 02/15/2020 Oral Phase Impaired Oral - Pudding Teaspoon -- Oral - Pudding Cup -- Oral - Honey Teaspoon -- Oral - Honey Cup -- Oral - Nectar Teaspoon -- Oral - Nectar Cup Premature spillage Oral - Nectar Straw -- Oral - Thin Teaspoon Premature spillage Oral - Thin Cup Premature spillage Oral - Thin Straw -- Oral - Puree Premature spillage Oral - Mech Soft Impaired mastication;Weak lingual manipulation Oral - Regular -- Oral - Multi-Consistency -- Oral - Pill -- Oral Phase - Comment --  CHL IP PHARYNGEAL PHASE 02/15/2020 Pharyngeal Phase Impaired Pharyngeal- Pudding Teaspoon -- Pharyngeal --  Pharyngeal- Pudding Cup -- Pharyngeal -- Pharyngeal- Honey Teaspoon -- Pharyngeal -- Pharyngeal- Honey Cup -- Pharyngeal -- Pharyngeal- Nectar Teaspoon -- Pharyngeal -- Pharyngeal- Nectar Cup Penetration/Aspiration during swallow;Penetration/Apiration after swallow;Moderate aspiration;Pharyngeal residue - valleculae;Pharyngeal residue - pyriform;Pharyngeal residue - posterior pharnyx;Delayed swallow initiation-pyriform sinuses;Reduced epiglottic inversion;Reduced anterior laryngeal mobility;Reduced airway/laryngeal closure;Reduced tongue base retraction Pharyngeal Material enters airway, CONTACTS cords and then ejected  out;Material enters airway, passes BELOW cords then ejected out Pharyngeal- Nectar Straw -- Pharyngeal -- Pharyngeal- Thin Teaspoon Delayed swallow initiation-pyriform sinuses;Reduced epiglottic inversion;Reduced anterior laryngeal mobility;Reduced airway/laryngeal closure;Reduced tongue base retraction;Pharyngeal residue - valleculae Pharyngeal Material does not enter airway Pharyngeal- Thin Cup Delayed swallow initiation-pyriform sinuses;Reduced epiglottic inversion;Reduced anterior laryngeal mobility;Reduced airway/laryngeal closure;Reduced tongue base retraction;Penetration/Aspiration during swallow;Penetration/Apiration after swallow;Trace aspiration;Pharyngeal residue - valleculae Pharyngeal Material does not enter airway;Material enters airway, CONTACTS cords and then ejected out;Material enters airway, passes BELOW cords then ejected out Pharyngeal- Thin Straw -- Pharyngeal -- Pharyngeal- Puree Reduced epiglottic inversion;Reduced anterior laryngeal mobility;Reduced airway/laryngeal closure;Reduced tongue base retraction;Penetration/Apiration after swallow;Pharyngeal residue - valleculae;Pharyngeal residue - pyriform;Pharyngeal residue - posterior pharnyx;Pharyngeal residue - cp segment Pharyngeal Material enters airway, passes BELOW cords then ejected out;Material does not enter airway Pharyngeal- Mechanical Soft Reduced epiglottic inversion;Reduced anterior laryngeal mobility;Reduced airway/laryngeal closure;Reduced tongue base retraction;Penetration/Apiration after swallow;Moderate aspiration;Pharyngeal residue - valleculae;Pharyngeal residue - pyriform;Pharyngeal residue - posterior pharnyx;Pharyngeal residue - cp segment Pharyngeal Material enters airway, passes BELOW cords then ejected out;Material enters airway, remains ABOVE vocal cords then ejected out Pharyngeal- Regular -- Pharyngeal -- Pharyngeal- Multi-consistency -- Pharyngeal -- Pharyngeal- Pill -- Pharyngeal -- Pharyngeal Comment --  CHL IP  CERVICAL ESOPHAGEAL PHASE 02/15/2020 Cervical Esophageal Phase Impaired Pudding Teaspoon -- Pudding Cup -- Honey Teaspoon -- Honey Cup -- Nectar Teaspoon -- Nectar Cup Reduced cricopharyngeal relaxation;Prominent cricopharyngeal segment Nectar Straw -- Thin Teaspoon Reduced cricopharyngeal relaxation;Prominent cricopharyngeal segment Thin Cup Reduced cricopharyngeal relaxation;Prominent cricopharyngeal segment Thin Straw -- Puree Reduced cricopharyngeal relaxation;Prominent cricopharyngeal segment Mechanical Soft Reduced cricopharyngeal relaxation;Prominent cricopharyngeal segment Regular -- Multi-consistency -- Pill -- Cervical Esophageal Comment -- Deneise Lever, MS, CCC-SLP Speech-Language Pathologist Aliene Altes 02/15/2020, 5:12 PM                 Subjective: Patient seen and examined at bedside this morning. Hemodynamically stable for discharge today.  Discharge Exam: Vitals:   02/17/20 0751 02/17/20 1121  BP: (!) 169/79 (!) 173/83  Pulse: 85 97  Resp: 17 20  Temp: 98 F (36.7 C) 98.1 F (36.7 C)  SpO2: 95% 94%   Vitals:   02/16/20 2333 02/17/20 0425 02/17/20 0751 02/17/20 1121  BP: (!) 144/58 (!) 167/68 (!) 169/79 (!) 173/83  Pulse: 85 81 85 97  Resp: 18 17 17 20   Temp: 97.8 F (36.6 C) 98 F (36.7 C) 98 F (36.7 C) 98.1 F (36.7 C)  TempSrc:    Oral  SpO2: 94% 95% 95% 94%  Weight:      Height:        General: Pt is alert, awake, not in acute distress Cardiovascular: RRR, S1/S2 +, no rubs, no gallops Respiratory: CTA bilaterally, no wheezing, no rhonchi Abdominal: Soft, NT, ND, bowel sounds + Extremities: no edema, no cyanosis    The results of significant diagnostics from this hospitalization (including imaging, microbiology, ancillary and laboratory) are listed below for reference.     Microbiology: No results found for this or any previous visit (from the past 240 hour(s)).   Labs: BNP (last 3 results) No results for input(s): BNP in the  last 8760  hours. Basic Metabolic Panel: No results for input(s): NA, K, CL, CO2, GLUCOSE, BUN, CREATININE, CALCIUM, MG, PHOS in the last 168 hours. Liver Function Tests: No results for input(s): AST, ALT, ALKPHOS, BILITOT, PROT, ALBUMIN in the last 168 hours. No results for input(s): LIPASE, AMYLASE in the last 168 hours. No results for input(s): AMMONIA in the last 168 hours. CBC: No results for input(s): WBC, NEUTROABS, HGB, HCT, MCV, PLT in the last 168 hours. Cardiac Enzymes: No results for input(s): CKTOTAL, CKMB, CKMBINDEX, TROPONINI in the last 168 hours. BNP: Invalid input(s): POCBNP CBG: No results for input(s): GLUCAP in the last 168 hours. D-Dimer No results for input(s): DDIMER in the last 72 hours. Hgb A1c No results for input(s): HGBA1C in the last 72 hours. Lipid Profile No results for input(s): CHOL, HDL, LDLCALC, TRIG, CHOLHDL, LDLDIRECT in the last 72 hours. Thyroid function studies No results for input(s): TSH, T4TOTAL, T3FREE, THYROIDAB in the last 72 hours.  Invalid input(s): FREET3 Anemia work up No results for input(s): VITAMINB12, FOLATE, FERRITIN, TIBC, IRON, RETICCTPCT in the last 72 hours. Urinalysis    Component Value Date/Time   COLORURINE STRAW (A) 02/15/2020 1417   APPEARANCEUR CLEAR (A) 02/15/2020 1417   APPEARANCEUR Hazy 11/02/2011 1708   LABSPEC 1.005 02/15/2020 1417   LABSPEC 1.025 11/02/2011 1708   PHURINE 5.0 02/15/2020 1417   GLUCOSEU NEGATIVE 02/15/2020 1417   GLUCOSEU 50 mg/dL 11/02/2011 1708   HGBUR NEGATIVE 02/15/2020 1417   HGBUR negative 11/26/2009 1200   BILIRUBINUR NEGATIVE 02/15/2020 1417   BILIRUBINUR 1 mg/dL 01/05/2020 0921   BILIRUBINUR Negative 11/02/2011 1708   KETONESUR NEGATIVE 02/15/2020 1417   PROTEINUR NEGATIVE 02/15/2020 1417   UROBILINOGEN 0.2 01/05/2020 0921   UROBILINOGEN 0.2 11/26/2009 1200   NITRITE NEGATIVE 02/15/2020 1417   LEUKOCYTESUR TRACE (A) 02/15/2020 1417   LEUKOCYTESUR Trace 11/02/2011 1708   Sepsis  Labs Invalid input(s): PROCALCITONIN,  WBC,  LACTICIDVEN Microbiology No results found for this or any previous visit (from the past 240 hour(s)).  Please note: You were cared for by a hospitalist during your hospital stay. Once you are discharged, your primary care physician will handle any further medical issues. Please note that NO REFILLS for any discharge medications will be authorized once you are discharged, as it is imperative that you return to your primary care physician (or establish a relationship with a primary care physician if you do not have one) for your post hospital discharge needs so that they can reassess your need for medications and monitor your lab values.    Time coordinating discharge: 40 minutes  SIGNED:   Shelly Coss, MD  [facility] 03/03/2020, 6:00 PM Pager [phone]  If 7PM-7AM, please contact night-coverage [email or web address] Password [email or web address]

## 2020-02-17 NOTE — Progress Notes (Signed)
Speech Language Pathology Treatment: Dysphagia  Patient Details Name: Suzanne Strong MRN: [id number] DOB: 1945-07-18 Today's Date: 02/17/2020 Time: 1700-1749 SLP Time Calculation (min) (ACUTE ONLY): 210 min  Assessment / Plan / Recommendation Clinical Impression  Suzanne Strong seen today for ongoing assessment of swallowing; toleration of diet and trials to upgrade as tolerates. Suzanne Strong sitting in chair in room; on RA and ready to d/c home today per NSG. Suzanne Strong was verbally conversive; followed all instructions. She was insightful as to her dysphagia, swallowing. She has been tolerating a pureed diet w/ thin liquids using her swallowing strategies and aspiration precautions per her report, NSG notes.  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 Overall function appears somewhat improved compared with MBS in June 2021, which showed silent aspiration of thin and nectar thick liquids, intermittently. Patient did aspirate after the swallow with larger sips of thin and nectar, and when performing liquid wash of solid, however these aspiration events elicited a protective cough response. Oral phase is note for xerostomia, and impaired bolus control with premature spillage of liquids to the pyriform sinuses before swallow initiation. Pharyngeal stage is characterized by reduced base of tongue retraction, resulting in residue (mild valleculae with thin, moderate with thicker liquids and solids in the valleculae, pyriforms, and along the posterior pharyngeal wall). Epiglottic inversion and airway closure is incomplete. With larger sips, liquid fills the pyriforms and spills into the laryngeal vestibule during and after the swallow, with eventual aspiration, x2. Cough response cleared the airway.   Hyolaryngeal excursion is reduced, which appears to contribute to reduced amplitude and duration  of cricopharyngeal opening. Presence of CP bar noted; combined with possibly altered pharyngeal pressures, also impacts pharyngeal stripping of the bolus. Several compensatory maneuvers were attempted including dry swallow, liquids wash, chin tuck with dry swallow, and super-supraglottic maneuver. Aspiration risk is judged to be moderate at this time, given active pneumonia. The safest diet at this time appears to be thin liquids and puree, and meds crushed in puree with use of compensations. Recommend small bites and sips. Suzanne Strong should alternate solids with liquids. Super-supraglottic maneuver is recommended (breath hold, swallow, immediate cough, swallow again), particularly with larger sips and when following a solid bolus, multiple swallows, throat clearing intermittently throughout meal. With resolution of acute illness it would be reasonable for patient to advance solids with continued use of the above precautions.".  Suzanne Strong was able to demonstrate the swallowing strategies as learned/recommended from the MBSS; she stated "they seem to help me swallow better". We discussed food consistencies and Prep to include adding Minced foods to purees; no deficits noted w/ Minced foods trialed. Discussed the grinder she uses at home to prepare some foods such as chicken salad and egg salad -- Minced foods beneficial for management of swallowing/clearing of the pharynx/UES w/ use of strategies. It was noted that Suzanne Strong has a prominent CP bar on the MBSS -- recommend f/u w/ ENT/GI for further evaluation of this as this will Continue to Impact the effectiveness and safety of her swallowing as well as ability to tolerate soft/minced solid foods for her diet/nutritional needs. Urged Suzanne Strong to talk w/ Dietician/MD re: drink supplement to support her nutrition also.  Education and handouts given on general aspiration and Reflux precautions; swallowing strategies including supraglottic swallowing; food/diet consistencies to include upgrade  to a dys. Level 2 Minced foods -- moistened well w/ condiments. Recommended Suzanne Strong f/u w/  PCP in monitoring of her Pulmonary status for any negative sequelae of aspiration; ie. Pulmonary change/decline at home then act quickly, not wait d/t her chronic dysphagia/aspiration risk. Suzanne Strong agreed w/ above discussion and recommendations. NSG present to update.     HPI HPI: Suzanne Strong is a 75 y.o. 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  Suzanne Strong's recent status is complicated by chronic cough and several weekly episodes of aspiration (chronic per MBSS in 07/2019 which revealed aspiration w/ all consistencies, also impacted by pharyngeal residue), and she presents for evaluation of shortness of breath. Suzanne Strong reports that she has been feeling sick for the last 4 to 5 days. Fevers at home. Started feeling short of breath yesterday. Has had a cough but she is not sure if this is different than her chronic cough. Has been feeling extremely fatigued and weak with difficulty walking today.  Suzanne Strong self reported episodes of Regurgitation d/t feelings of foods "getting stuck here"(pointed to her breastbone area).      SLP Plan  All goals met       Recommendations  Diet recommendations: Dysphagia 2 (fine chop);Thin liquid (continue w/ diet upgrade to level 2 minced foods) Liquids provided via: Cup;No straw Medication Administration: Crushed with puree (baseline) Supervision: Patient able to self feed Compensations: Minimize environmental distractions;Slow rate;Small sips/bites;Lingual sweep for clearance of pocketing;Multiple dry swallows after each bite/sip;Follow solids with liquid;Effortful swallow;Hard cough after swallow (Hold Breath during swallow) Postural Changes and/or Swallow Maneuvers: Seated upright 90 degrees;Upright 30-60 min after meal;Hold breath before and  during swallow (Supraglottic swallow) (REFLUX precautions)                General recommendations:  (Dietician f/u as needed) Oral Care Recommendations: Oral care BID;Oral care before and after PO;Patient independent with oral care Follow up Recommendations:  (can consider Home Health f/u for ed/support as needed) SLP Visit Diagnosis: Dysphagia, oropharyngeal phase (R13.12);Dysphagia, pharyngoesophageal phase (R13.14) (chronic) Plan: All goals met       GO                 Orinda Kenner, MS, CCC-SLP Speech Language Pathologist [facility] [phone] [facility] 02/17/2020, 1:10 PM

## 2020-02-17 NOTE — Discharge Instructions (Signed)
Acute Respiratory Failure, Adult  Acute respiratory failure occurs when there is not enough oxygen passing from your lungs to your body. When this happens, your lungs have trouble removing carbon dioxide from the blood. This causes your blood oxygen level to drop too low as carbon dioxide builds up. Acute respiratory failure is a medical emergency. It can develop quickly, but it is temporary if treated promptly. Your lung capacity, or how much air your lungs can hold, may improve with time, exercise, and treatment. What are the causes? There are many possible causes of acute respiratory failure, including:  Lung injury.  Chest injury or damage to the ribs or tissues near the lungs.  Lung conditions that affect the flow of air and blood into and out of the lungs, such as pneumonia, acute respiratory distress syndrome, and cystic fibrosis.  Medical conditions, such as strokes or spinal cord injuries, that affect the muscles and nerves that control breathing.  Blood infection (sepsis).  Inflammation of the pancreas (pancreatitis).  A blood clot in the lungs (pulmonary embolism).  A large-volume blood transfusion.  Burns.  Near-drowning.  Seizure.  Smoke inhalation.  Reaction to medicines.  Alcohol or drug overdose. What increases the risk? This condition is more likely to develop in people who have:  A blocked airway.  Asthma.  A condition or disease that damages or weakens the muscles, nerves, bones, or tissues that are involved in breathing.  A serious infection.  A health problem that blocks the unconscious reflex that is involved in breathing, such as hypothyroidism or sleep apnea.  A lung injury or trauma. What are the signs or symptoms? Trouble breathing is the main symptom of acute respiratory failure. Symptoms may also include:  Rapid breathing.  Restlessness or anxiety.  Skin, lips, or fingernails that appear blue (cyanosis).  Rapid heart  rate.  Abnormal heart rhythms (arrhythmias).  Confusion or changes in behavior.  Tiredness or loss of energy.  Feeling sleepy or having a loss of consciousness. How is this diagnosed? Your health care provider can diagnose acute respiratory failure with a medical history and physical exam. During the exam, your health care provider will listen to your heart and check for crackling or wheezing sounds in your lungs. Your may also have tests to confirm the diagnosis and determine what is causing respiratory failure. These tests may include:  Measuring the amount of oxygen in your blood (pulse oximetry). The measurement comes from a small device that is placed on your finger, earlobe, or toe.  Other blood tests to measure blood gases and to look for signs of infection.  Sampling your cerebral spinal fluid or tracheal fluid to check for infections.  Chest X-ray to look for fluid in spaces that should be filled with air.  Electrocardiogram (ECG) to look at the heart's electrical activity. How is this treated? Treatment for this condition usually takes places in a hospital intensive care unit (ICU). Treatment depends on what is causing the condition. It may include one or more treatments until your symptoms improve. Treatment may include:  Supplemental oxygen. Extra oxygen is given through a tube in the nose, a face mask, or a hood.  A device such as a continuous positive airway pressure (CPAP) or bi-level positive airway pressure (BiPAP or BPAP) machine. This treatment uses mild air pressure to keep the airways open. A mask or other device will be placed over your nose or mouth. A tube that is connected to a motor will deliver oxygen through the   mask.  Ventilator. This treatment helps move air into and out of the lungs. This may be done with a bag and mask or a machine. For this treatment, a tube is placed in your windpipe (trachea) so air and oxygen can flow to the lungs.  Extracorporeal  membrane oxygenation (ECMO). This treatment temporarily takes over the function of the heart and lungs, supplying oxygen and removing carbon dioxide. ECMO gives the lungs a chance to recover. It may be used if a ventilator is not effective.  Tracheostomy. This is a procedure that creates a hole in the neck to insert a breathing tube.  Receiving fluids and medicines.  Rocking the bed to help breathing. Follow these instructions at home:  Take over-the-counter and prescription medicines only as told by your health care provider.  Return to normal activities as told by your health care provider. Ask your health care provider what activities are safe for you.  Keep all follow-up visits as told by your health care provider. This is important. How is this prevented? Treating infections and medical conditions that may lead to acute respiratory failure can help prevent the condition from developing. Contact a health care provider if:  You have a fever.  Your symptoms do not improve or they get worse. Get help right away if:  You are having trouble breathing.  You lose consciousness.  Your have cyanosis or turn blue.  You develop a rapid heart rate.  You are confused. These symptoms may represent a serious problem that is an emergency. Do not wait to see if the symptoms will go away. Get medical help right away. Call your local emergency services (911 in the [place] Do not drive yourself to the hospital. This information is not intended to replace advice given to you by your health care provider. Make sure you discuss any questions you have with your health care provider. Document Revised: 01/15/2017 Document Reviewed: 08/21/2015 Elsevier Patient Education  [place] Pneumonia, Adult Pneumonia is a type of lung infection that causes swelling in the airways of the lungs. Mucus and fluid may also build up inside the airways. This may cause coughing and  difficulty breathing. There are different types of pneumonia. One type can develop while a person is in a hospital. A different type is called community-acquired pneumonia. It develops in people who are not, and have not recently been, in the hospital or another type of health care facility. What are the causes? This condition may be caused by:  Viruses. This is the most common cause of pneumonia.  Bacteria. Community-acquired pneumonia is often caused by Streptococcus pneumoniae bacteria. These bacteria are often passed from one person to another by breathing in droplets from the cough or sneeze of an infected person.  Fungi. This is the least common cause of pneumonia. What increases the risk? The following factors may make you more likely to develop this condition:  Having a chronic disease, such as chronic obstructive pulmonary disease (COPD), asthma, congestive heart failure, cystic fibrosis, diabetes, or kidney disease.  Having early-stage or late-stage HIV.  Having sickle cell disease.  Having had your spleen removed (splenectomy).  Having poor dental hygiene.  Having a medical condition that increases the risk of breathing in (aspirating) secretions from your own mouth and nose.  Having a weakened body defense system (immune system).  Being a smoker.  Traveling to areas where pneumonia-causing germs commonly exist.  Being around animal habitats or animals that have pneumonia-causing germs,  including birds, bats, rabbits, cats, and farm animals. What are the signs or symptoms? Symptoms of this condition include:  A dry cough.  A wet (productive) cough.  Fever.  Sweating.  Chest pain, especially when breathing deeply or coughing.  Rapid breathing or difficulty breathing.  Shortness of breath.  Shaking chills.  Fatigue.  Muscle aches. How is this diagnosed? This condition may be diagnosed based on:  Your medical history.  A physical exam. You may also  have tests, including:  Chest X-rays.  Tests of your blood oxygen level and other blood gases.  Tests on blood, mucus (sputum), fluid around your lungs (pleural fluid), and urine. If your pneumonia is severe, other tests may be done to find the exact cause of your illness. How is this treated? Treatment for this condition depends on many factors, such as the cause of your pneumonia, the medicines you take, and other medical conditions that you have. For most adults, treatment and recovery from pneumonia may occur at home. In some cases, treatment must happen in a hospital. Treatment may include:  Medicines that are given by mouth or through an IV, including: ? Antibiotic medicines, if the pneumonia was caused by bacteria. ? Antiviral medicines, if the pneumonia was caused by a virus.  Being given extra oxygen.  Respiratory therapy. Although rare, treating severe pneumonia may include:  Using a machine to help you breathe (mechanical ventilation). This is done if you are not breathing well on your own and you cannot maintain a safe blood oxygen level.  Thoracentesis. This is a procedure to remove fluid from around one lung or both lungs to help you breathe better. Follow these instructions at home:  Medicines  Take over-the-counter and prescription medicines only as told by your health care provider. ? Only take cough medicine if you are losing sleep. Be aware that cough medicine can prevent your body's natural ability to remove mucus from your lungs.  If you were prescribed an antibiotic medicine, take it as told by your health care provider. Do not stop taking the antibiotic even if you start to feel better. General instructions  Sleep in a semi-upright position at night. Try sleeping in a reclining chair, or place a few pillows under your head.  Rest as needed and get at least 8 hours of sleep each night.  Drink enough water to keep your urine pale yellow. This will help to  thin out mucus secretions in your lungs.  Eat a healthy diet that includes plenty of vegetables, fruits, whole grains, low-fat dairy products, and lean protein.  Do not use any products that contain nicotine or tobacco, such as cigarettes, e-cigarettes, and chewing tobacco. If you need help quitting, ask your health care provider.  Keep all follow-up visits as told by your health care provider. This is important. How is this prevented? You can lower your risk of developing community-acquired pneumonia by:  Getting a pneumococcal vaccine. There are different types and schedules of pneumococcal vaccines. Ask your health care provider which option is best for you. Consider getting the vaccine if: ? You are older than 74 years of age. ? You are older than 73 years of age and are undergoing cancer treatment, have chronic lung disease, or have other medical conditions that affect your immune system. Ask your health care provider if this applies to you.  Getting an influenza vaccine every year. Ask your health care provider which type of vaccine is best for you.  Getting regular checkups  from your dentist.  Washing your hands often. If soap and water are not available, use hand sanitizer. Contact a health care provider if:  You have a fever.  You are losing sleep because you cannot control your cough with cough medicine. Get help right away if:  You have worsening shortness of breath.  You have increased chest pain.  Your sickness becomes worse, especially if you are an older adult or have a weakened immune system.  You cough up blood. Summary  Pneumonia is an infection of the lungs.  Community-acquired pneumonia develops in people who have not been in the hospital. It can be caused by bacteria, viruses, or fungi.  This condition may be treated with antibiotics or antiviral medicines.  Severe cases may require hospitalization, mechanical ventilation, and other procedures to drain  fluid from the lungs. This information is not intended to replace advice given to you by your health care provider. Make sure you discuss any questions you have with your health care provider. Document Revised: 09/30/2017 Document Reviewed: 09/30/2017 Elsevier Patient Education  [place].   Healthcare-Associated Pneumonia  Healthcare-associated pneumonia is a lung infection that a person can get when in a health care setting or during certain procedures. The infection causes air sacs inside the lungs to fill with pus or fluid. Healthcare-associated pneumonia is usually caused by bacteria that are common in health care settings. These bacteria may be resistant to some antibiotic medicines. What are the causes? This condition is caused by bacteria that get into your lungs. You can get this condition if you:  Breathe in droplets from an infected person's cough or sneeze.  Touch something that an infected person coughed or sneezed on and then touch your mouth, nose, or eyes.  Have a bacterial infection somewhere else in your body, if the bacteria spread to your lungs through your blood. What increases the risk? This condition is more likely to develop in people who:  Have a disease that weakens their body's defense system (immune system) or their ability to cough out germs.  Are older than age 85.  Having trouble swallowing.  Use a feeding or breathing tube.  Have a cold or the flu.  Have an IV tube inserted in a vein.  Have surgery.  Have a bed sore.  Live in a long-term care facility, such as a nursing home.  Were in the hospital for two or more days in the past 3 months.  Received hemodialysis in the past 30 days. What are the signs or symptoms? Symptoms of this condition include:  Fever.  Chills.  Cough.  Shortness of breath.  Wheezing or crackling sounds when breathing. How is this diagnosed? This condition may be diagnosed based on:  Your  symptoms.  A chest X-ray.  A measurement of the amount of oxygen in your blood. How is this treated? This condition is treated with antibiotics. Your health care provider may take a sample of cells (culture) from your throat to determine what type of bacteria is in your lungs and change your antibiotic based on the results. If you have bacteria in your blood, trouble breathing, or a low oxygen level, you may need to be treated at the hospital. At the hospital, you will be given antibiotics through an IV tube. You may also be given oxygen or breathing treatments. Follow these instructions at home: Medicine  Take your antibiotic medicine as told by your health care provider. Do not stop taking the antibiotic even if  you start to feel better.  Take over-the-counter and other prescription medicines only as told by your health care provider. Activity  Rest at home until you feel better.  Return to your normal activities as told by your health care provider. Ask your health care provider what activities are safe for you. General instructions   Drink enough fluid to keep your urine clear or pale yellow.  Do not use any products that contain nicotine or tobacco, such as cigarettes and e-cigarettes. If you need help quitting, ask your health care provider.  Limit alcohol intake to no more than 1 drink per day for nonpregnant women and 2 drinks per day for men. One drink equals 12 oz of beer, 5 oz of wine, or 1 oz of hard liquor.  Keep all follow-up visits as told by your health care provider. This is important. How is this prevented? Actions that I can take To lower your risk of getting this condition again:  Do not smoke. This includes e-cigarettes.  Do not drink too much alcohol.  Keep your immune system healthy by eating well and getting enough sleep.  Get a flu shot every year (annually).  Get a pneumonia vaccination if: ? You are older than age 23. ? You smoke. ? You have a  long-lasting condition like lung disease.  Exercise your lungs by taking deep breaths, walking, and using an incentive spirometer as directed.  Wash your hands often with soap and water. If you cannot get to a sink to wash your hands, use an alcohol-based hand cleaner.  Make sure your health care providers are washing their hands. If you do not see them wash their hands, ask them to do so.  When you are in a health care facility, avoid touching your eyes, nose, and mouth.  Avoid touching any surface near where people have coughed or sneezed.  Stand away from sick people when they are coughing or sneezing.  Wear a mask if you cannot avoid exposure to people who are sick.  Clean all surfaces often with a disinfectant cleaner, especially if someone is sick at home or work.  Precautions of my health care team Hospitals, nursing homes, and other health care facilities take special care to try to prevent healthcare-associated pneumonia. To do this, your health care team may:  Clean their hands with soap and water or with alcohol-based hand sanitizer before and after seeing patients.  Wear gloves or masks during treatment.  Sanitize medical instruments, tubes, other equipment, and surfaces in patient rooms.  Raise (elevate) the head of your hospital bed so you are not lying flat. The head of the bed may be elevated 30 degrees or more.  Have you sit up and move around as soon as possible after surgery.  Only insert a breathing tube if needed.  Do these things for you if you have a breathing tube: ? Clean the inside of your mouth regularly. ? Remove the breathing tube as soon as it is no longer needed. Contact a health care provider if:  Your symptoms do not get better or they get worse.  Your symptoms come back after you have finished taking your antibiotics. Get help right away if:  You have trouble breathing.  You have confusion or difficulty thinking. This information is not  intended to replace advice given to you by your health care provider. Make sure you discuss any questions you have with your health care provider. Document Revised: 01/15/2017 Document Reviewed: 11/01/2015 Elsevier Patient  Education  [place] Pneumonia, Adult Pneumonia is an infection of the lungs. It causes swelling in the airways of the lungs. Mucus and fluid may also build up inside the airways. One type of pneumonia can happen while a person is in a hospital. A different type can happen when a person is not in a hospital (community-acquired pneumonia).  What are the causes?  This condition is caused by germs (viruses, bacteria, or fungi). Some types of germs can be passed from one person to another. This can happen when you breathe in droplets from the cough or sneeze of an infected person. What increases the risk? You are more likely to develop this condition if you:  Have a long-term (chronic) disease, such as: ? Chronic obstructive pulmonary disease (COPD). ? Asthma. ? Cystic fibrosis. ? Congestive heart failure. ? Diabetes. ? Kidney disease.  Have HIV.  Have sickle cell disease.  Have had your spleen removed.  Do not take good care of your teeth and mouth (poor dental hygiene).  Have a medical condition that increases the risk of breathing in droplets from your own mouth and nose.  Have a weakened body defense system (immune system).  Are a smoker.  Travel to areas where the germs that cause this illness are common.  Are around certain animals or the places they live. What are the signs or symptoms?  A dry cough.  A wet (productive) cough.  Fever.  Sweating.  Chest pain. This often happens when breathing deeply or coughing.  Fast breathing or trouble breathing.  Shortness of breath.  Shaking chills.  Feeling tired (fatigue).  Muscle aches. How is this treated? Treatment for this condition depends on many things. Most  adults can be treated at home. In some cases, treatment must happen in a hospital. Treatment may include:  Medicines given by mouth or through an IV tube.  Being given extra oxygen.  Respiratory therapy. In rare cases, treatment for very bad pneumonia may include:  Using a machine to help you breathe.  Having a procedure to remove fluid from around your lungs. Follow these instructions at home: Medicines  Take over-the-counter and prescription medicines only as told by your doctor. ? Only take cough medicine if you are losing sleep.  If you were prescribed an antibiotic medicine, take it as told by your doctor. Do not stop taking the antibiotic even if you start to feel better. General instructions   Sleep with your head and neck raised (elevated). You can do this by sleeping in a recliner or by putting a few pillows under your head.  Rest as needed. Get at least 8 hours of sleep each night.  Drink enough water to keep your pee (urine) pale yellow.  Eat a healthy diet that includes plenty of vegetables, fruits, whole grains, low-fat dairy products, and lean protein.  Do not use any products that contain nicotine or tobacco. These include cigarettes, e-cigarettes, and chewing tobacco. If you need help quitting, ask your doctor.  Keep all follow-up visits as told by your doctor. This is important. How is this prevented? A shot (vaccine) can help prevent pneumonia. Shots are often suggested for:  People older than 73 years of age.  People older than 73 years of age who: ? Are having cancer treatment. ? Have long-term (chronic) lung disease. ? Have problems with their body's defense system. You may also prevent pneumonia if you take these actions:  Get the flu (influenza) shot every  year.  Go to the dentist as often as told.  Wash your hands often. If you cannot use soap and water, use hand sanitizer. Contact a doctor if:  You have a fever.  You lose sleep because  your cough medicine does not help. Get help right away if:  You are short of breath and it gets worse.  You have more chest pain.  Your sickness gets worse. This is very serious if: ? You are an older adult. ? Your body's defense system is weak.  You cough up blood. Summary  Pneumonia is an infection of the lungs.  Most adults can be treated at home. Some will need treatment in a hospital.  Drink enough water to keep your pee pale yellow.  Get at least 8 hours of sleep each night. This information is not intended to replace advice given to you by your health care provider. Make sure you discuss any questions you have with your health care provider. Document Revised: 05/25/2018 Document Reviewed: 09/30/2017 Elsevier Patient Education  [place].   Generalized Anxiety Disorder, Adult Generalized anxiety disorder (GAD) is a mental health disorder. People with this condition constantly worry about everyday events. Unlike normal anxiety, worry related to GAD is not triggered by a specific event. These worries also do not fade or get better with time. GAD interferes with life functions, including relationships, work, and school. GAD can vary from mild to severe. People with severe GAD can have intense waves of anxiety with physical symptoms (panic attacks). What are the causes? The exact cause of GAD is not known. What increases the risk? This condition is more likely to develop in:  Women.  People who have a family history of anxiety disorders.  People who are very shy.  People who experience very stressful life events, such as the death of a loved one.  People who have a very stressful family environment. What are the signs or symptoms? People with GAD often worry excessively about many things in their lives, such as their health and family. They may also be overly concerned about:  Doing well at work.  Being on time.  Natural  disasters.  Friendships. Physical symptoms of GAD include:  Fatigue.  Muscle tension or having muscle twitches.  Trembling or feeling shaky.  Being easily startled.  Feeling like your heart is pounding or racing.  Feeling out of breath or like you cannot take a deep breath.  Having trouble falling asleep or staying asleep.  Sweating.  Nausea, diarrhea, or irritable bowel syndrome (IBS).  Headaches.  Trouble concentrating or remembering facts.  Restlessness.  Irritability. How is this diagnosed? Your health care provider can diagnose GAD based on your symptoms and medical history. You will also have a physical exam. The health care provider will ask specific questions about your symptoms, including how severe they are, when they started, and if they come and go. Your health care provider may ask you about your use of alcohol or drugs, including prescription medicines. Your health care provider may refer you to a mental health specialist for further evaluation. Your health care provider will do a thorough examination and may perform additional tests to rule out other possible causes of your symptoms. To be diagnosed with GAD, a person must have anxiety that:  Is out of his or her control.  Affects several different aspects of his or her life, such as work and relationships.  Causes distress that makes him or her unable to take  part in normal activities.  Includes at least three physical symptoms of GAD, such as restlessness, fatigue, trouble concentrating, irritability, muscle tension, or sleep problems. Before your health care provider can confirm a diagnosis of GAD, these symptoms must be present more days than they are not, and they must last for six months or longer. How is this treated? The following therapies are usually used to treat GAD:  Medicine. Antidepressant medicine is usually prescribed for long-term daily control. Antianxiety medicines may be added in  severe cases, especially when panic attacks occur.  Talk therapy (psychotherapy). Certain types of talk therapy can be helpful in treating GAD by providing support, education, and guidance. Options include: ? Cognitive behavioral therapy (CBT). People learn coping skills and techniques to ease their anxiety. They learn to identify unrealistic or negative thoughts and behaviors and to replace them with positive ones. ? Acceptance and commitment therapy (ACT). This treatment teaches people how to be mindful as a way to cope with unwanted thoughts and feelings. ? Biofeedback. This process trains you to manage your body's response (physiological response) through breathing techniques and relaxation methods. You will work with a therapist while machines are used to monitor your physical symptoms.  Stress management techniques. These include yoga, meditation, and exercise. A mental health specialist can help determine which treatment is best for you. Some people see improvement with one type of therapy. However, other people require a combination of therapies. Follow these instructions at home:  Take over-the-counter and prescription medicines only as told by your health care provider.  Try to maintain a normal routine.  Try to anticipate stressful situations and allow extra time to manage them.  Practice any stress management or self-calming techniques as taught by your health care provider.  Do not punish yourself for setbacks or for not making progress.  Try to recognize your accomplishments, even if they are small.  Keep all follow-up visits as told by your health care provider. This is important. Contact a health care provider if:  Your symptoms do not get better.  Your symptoms get worse.  You have signs of depression, such as: ? A persistently sad, cranky, or irritable mood. ? Loss of enjoyment in activities that used to bring you joy. ? Change in weight or eating. ? Changes in  sleeping habits. ? Avoiding friends or family members. ? Loss of energy for normal tasks. ? Feelings of guilt or worthlessness. Get help right away if:  You have serious thoughts about hurting yourself or others. If you ever feel like you may hurt yourself or others, or have thoughts about taking your own life, get help right away. You can go to your nearest emergency department or call:  Your local emergency services (911 in the [place]  A suicide crisis helpline, such as the [facility] at 228-656-3316. This is open 24 hours a day. Summary  Generalized anxiety disorder (GAD) is a mental health disorder that involves worry that is not triggered by a specific event.  People with GAD often worry excessively about many things in their lives, such as their health and family.  GAD may cause physical symptoms such as restlessness, trouble concentrating, sleep problems, frequent sweating, nausea, diarrhea, headaches, and trembling or muscle twitching.  A mental health specialist can help determine which treatment is best for you. Some people see improvement with one type of therapy. However, other people require a combination of therapies. This information is not intended to replace advice given to you  by your health care provider. Make sure you discuss any questions you have with your health care provider. Document Revised: 01/15/2017 Document Reviewed: 12/24/2015 Elsevier Patient Education  2020 Elsevier Inc.   Managing Anxiety, Adult After being diagnosed with an anxiety disorder, you may be relieved to know why you have felt or behaved a certain way. You may also feel overwhelmed about the treatment ahead and what it will mean for your life. With care and support, you can manage this condition and recover from it. How to manage lifestyle changes Managing stress and anxiety  Stress is your body's reaction to life changes and events, both good and bad. Most  stress will last just a few hours, but stress can be ongoing and can lead to more than just stress. Although stress can play a major role in anxiety, it is not the same as anxiety. Stress is usually caused by something external, such as a deadline, test, or competition. Stress normally passes after the triggering event has ended.  Anxiety is caused by something internal, such as imagining a terrible outcome or worrying that something will go wrong that will devastate you. Anxiety often does not go away even after the triggering event is over, and it can become long-term (chronic) worry. It is important to understand the differences between stress and anxiety and to manage your stress effectively so that it does not lead to an anxious response. Talk with your health care provider or a counselor to learn more about reducing anxiety and stress. He or she may suggest tension reduction techniques, such as:  Music therapy. This can include creating or listening to music that you enjoy and that inspires you.  Mindfulness-based meditation. This involves being aware of your normal breaths while not trying to control your breathing. It can be done while sitting or walking.  Centering prayer. This involves focusing on a word, phrase, or sacred image that means something to you and brings you peace.  Deep breathing. To do this, expand your stomach and inhale slowly through your nose. Hold your breath for 3-5 seconds. Then exhale slowly, letting your stomach muscles relax.  Self-talk. This involves identifying thought patterns that lead to anxiety reactions and changing those patterns.  Muscle relaxation. This involves tensing muscles and then relaxing them. Choose a tension reduction technique that suits your lifestyle and personality. These techniques take time and practice. Set aside 5-15 minutes a day to do them. Therapists can offer counseling and training in these techniques. The training to help with  anxiety may be covered by some insurance plans. Other things you can do to manage stress and anxiety include:  Keeping a stress/anxiety diary. This can help you learn what triggers your reaction and then learn ways to manage your response.  Thinking about how you react to certain situations. You may not be able to control everything, but you can control your response.  Making time for activities that help you relax and not feeling guilty about spending your time in this way.  Visual imagery and yoga can help you stay calm and relax.  Medicines Medicines can help ease symptoms. Medicines for anxiety include:  Anti-anxiety drugs.  Antidepressants. Medicines are often used as a primary treatment for anxiety disorder. Medicines will be prescribed by a health care provider. When used together, medicines, psychotherapy, and tension reduction techniques may be the most effective treatment. Relationships Relationships can play a big part in helping you recover. Try to spend more time connecting with trusted  friends and family members. Consider going to couples counseling, taking family education classes, or going to family therapy. Therapy can help you and others better understand your condition. How to recognize changes in your anxiety Everyone responds differently to treatment for anxiety. Recovery from anxiety happens when symptoms decrease and stop interfering with your daily activities at home or work. This may mean that you will start to:  Have better concentration and focus. Worry will interfere less in your daily thinking.  Sleep better.  Be less irritable.  Have more energy.  Have improved memory. It is important to recognize when your condition is getting worse. Contact your health care provider if your symptoms interfere with home or work and you feel like your condition is not improving. Follow these instructions at home: Activity  Exercise. Most adults should do the  following: ? Exercise for at least 150 minutes each week. The exercise should increase your heart rate and make you sweat (moderate-intensity exercise). ? Strengthening exercises at least twice a week.  Get the right amount and quality of sleep. Most adults need 7-9 hours of sleep each night. Lifestyle   Eat a healthy diet that includes plenty of vegetables, fruits, whole grains, low-fat dairy products, and lean protein. Do not eat a lot of foods that are high in solid fats, added sugars, or salt.  Make choices that simplify your life.  Do not use any products that contain nicotine or tobacco, such as cigarettes, e-cigarettes, and chewing tobacco. If you need help quitting, ask your health care provider.  Avoid caffeine, alcohol, and certain over-the-counter cold medicines. These may make you feel worse. Ask your pharmacist which medicines to avoid. General instructions  Take over-the-counter and prescription medicines only as told by your health care provider.  Keep all follow-up visits as told by your health care provider. This is important. Where to find support You can get help and support from these sources:  Self-help groups.  Online and [employer].  A trusted spiritual leader.  Couples counseling.  Family education classes.  Family therapy. Where to find more information You may find that joining a support group helps you deal with your anxiety. The following sources can help you locate counselors or support groups near you:  [place]: www.mentalhealthamerica.net  Anxiety and Depression Association of [place] (ADAA): https://www.clark.net/  National Alliance on Mental Illness (NAMI): www.nami.org Contact a health care provider if you:  Have a hard time staying focused or finishing daily tasks.  Spend many hours a day feeling worried about everyday life.  Become exhausted by worry.  Start to have headaches, feel tense, or have nausea.  Urinate  more than normal.  Have diarrhea. Get help right away if you have:  A racing heart and shortness of breath.  Thoughts of hurting yourself or others. If you ever feel like you may hurt yourself or others, or have thoughts about taking your own life, get help right away. You can go to your nearest emergency department or call:  Your local emergency services (911 in the [place]  A suicide crisis helpline, such as the [place] at (856)030-6384. This is open 24 hours a day. Summary  Taking steps to learn and use tension reduction techniques can help calm you and help prevent triggering an anxiety reaction.  When used together, medicines, psychotherapy, and tension reduction techniques may be the most effective treatment.  Family, friends, and partners can play a big part in helping you recover from  an anxiety disorder. This information is not intended to replace advice given to you by your health care provider. Make sure you discuss any questions you have with your health care provider. Document Revised: 07/05/2018 Document Reviewed: 07/05/2018 Elsevier Patient Education  [place].

## 2020-02-19 ENCOUNTER — Telehealth: Payer: Self-pay

## 2020-02-19 NOTE — Telephone Encounter (Signed)
Transition Care Management Follow-up Telephone Call  Date of discharge and from where: 02/17/2020, [facility]  How have you been since you were released from the hospital? Patient states that she is feeling better now.   Any questions or concerns? No  Items Reviewed:  Did the pt receive and understand the discharge instructions provided? Yes   Medications obtained and verified? Yes   Other? No   Any new allergies since your discharge? No   Dietary orders reviewed? Yes  Do you have support at home? Yes   Home Care and Equipment/Supplies: Were home health services ordered? not applicable If so, what is the name of the agency? N/A  Has the agency set up a time to come to the patient's home? not applicable Were any new equipment or medical supplies ordered?  No What is the name of the medical supply agency? N/A Were you able to get the supplies/equipment? not applicable Do you have any questions related to the use of the equipment or supplies? No  Functional Questionnaire: (I = Independent and D = Dependent) ADLs: I  Bathing/Dressing- I  Meal Prep- I  Eating- I  Maintaining continence- I  Transferring/Ambulation- I  Managing Meds- I  Follow up appointments reviewed:   PCP Hospital f/u appt confirmed? Yes  Scheduled to see Dr. Milinda Antis on 02/27/2020 @ 10 am.  Specialist Hospital f/u appt confirmed? N/A   Are transportation arrangements needed? No   If their condition worsens, is the pt aware to call PCP or go to the Emergency Dept.? Yes  Was the patient provided with contact information for the PCP's office or ED? Yes  Was to pt encouraged to call back with questions or concerns? Shona Needles

## 2020-02-20 ENCOUNTER — Telehealth: Payer: Self-pay

## 2020-02-20 LAB — CULTURE, BLOOD (ROUTINE X 2)
Culture: NO GROWTH
Culture: NO GROWTH
Special Requests: ADEQUATE
Special Requests: ADEQUATE

## 2020-02-20 NOTE — Chronic Care Management (AMB) (Signed)
Chronic Care Management Pharmacy Assistant   Name: MCKYNLEE LUSE  MRN: [id number] DOB: 1945/10/22  Reason for Encounter: Disease State  Patient Questions:  1.  Have you seen any other providers since your last visit? Yes 02/12/20- Dr. Carlena Sax- Endocrinologist 02/15/20- ED visit due to shortness of breath.   2.  Any changes in your medicines or health? Yes 12/27/2- Dr. Tedd Sias reduced glipizide to 5 mg twice a day due to low blood sugars.    PCP : Judy Pimple, MD  Allergies:   Allergies  Allergen Reactions  . Amoxicillin Swelling    REACTION: rash, swelling Has patient had a PCN reaction causing immediate rash, facial/tongue/throat swelling, SOB or lightheadedness with hypotension: yes Has patient had a PCN reaction causing severe rash involving mucus membranes or skin necrosis: no Has patient had a PCN reaction that required hospitalization: no Has patient had a PCN reaction occurring within the last 10 years: yes If all of the above answers are "NO", then may proceed with Cephalosporin use.   [name] Fluconazole Rash    REACTION: hives  . Difuleron [Ferrous Fumarate-Dss]   . 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 albuterol (VENTOLIN HFA) 108 (90 Base) MCG/ACT inhaler INHALE TWO PUFFS BY MOUTH EVERY SIX HOURS AS NEEDED   . ALPRAZolam (XANAX) 0.5 MG tablet Take 1 tablet (0.5 mg total) by mouth 2 (two) times daily as needed for anxiety or sleep.   [name] amitriptyline (ELAVIL) 50 MG tablet Take 1 tablet (50 mg total) by mouth at bedtime.   [name] amLODipine (NORVASC) 5 MG tablet Take 1 tablet (5 mg total) by mouth daily.   [name] atorvastatin (LIPITOR) 10 MG tablet TAKE 1 TABLET(10 MG) BY MOUTH DAILY   . b complex vitamins tablet Take 1 tablet by mouth daily.   . busPIRone (BUSPAR) 15 MG tablet Take 1 tablet (15 mg total) by  mouth 2 (two) times daily.   . cefdinir (OMNICEF) 300 MG capsule Take 1 capsule (300 mg total) by mouth 2 (two) times daily for 4 days.   . Cholecalciferol (VITAMIN D-1000 MAX ST) 25 MCG (1000 UT) tablet Take 2,000 Units by mouth daily.    [name] dextromethorphan-guaiFENesin (MUCINEX DM) 30-600 MG 12hr tablet Take 1 tablet by mouth 2 (two) times daily as needed for up to 5 days for cough.   [name] glipiZIDE (GLUCOTROL) 10 MG tablet Take 10 mg by mouth 2 (two) times daily before a meal.  02/15/2020: Patient takes 5 mg twice daily  . glucose blood test strip To check blood glucose once daily and prn for diabetes   . JANUMET XR 50-1000 MG TB24 Take 1 tablet by mouth 2 (two) times daily.    [name] lidocaine-prilocaine (EMLA) cream Apply 1 application topically as needed. Apply to port when going through Chemo   . Multiple Vitamin (MULTIVITAMIN) tablet Take 1 tablet by mouth daily.   . Multiple Vitamins-Minerals (WOMENS MULTIVITAMIN PO) Take by mouth. (Patient not taking: No sig reported)   . omeprazole (PRILOSEC) 20 MG capsule TAKE 1 CAPSULE(20 MG) BY MOUTH AT BEDTIME   . pregabalin (LYRICA) 75 MG capsule Take 1 capsule (75 mg total) by mouth daily.   [name] rOPINIRole (REQUIP) 1 MG tablet TAKE 1/2 TABLET BY MOUTH EVERY MORNING and TAKE THREE TABLETS BY MOUTH EVERYDAY AT  BEDTIME (Patient taking differently: Take 0.5-3 mg by mouth 2 (two) times daily. TAKE 1/2 TABLET BY MOUTH EVERY MORNING and TAKE THREE TABLETS BY MOUTH EVERYDAY AT BEDTIME)   . sertraline (ZOLOFT) 100 MG tablet TAKE 2 TABLETS BY MOUTH EVERY DAY (Patient taking differently: Take 200 mg by mouth daily. TAKE 2 TABLETS BY MOUTH EVERY DAY)   . Suvorexant (BELSOMRA) 20 MG TABS Take 20 mg by mouth at bedtime.   . traZODone (DESYREL) 100 MG tablet Take 100 mg by mouth at bedtime.    Facility-Administered Encounter Medications as of 02/20/2020  Medication  . sodium chloride flush (NS) 0.9 % injection 10 mL    Current Diagnosis: Patient Active Problem List    Diagnosis Date Noted  . Aspiration pneumonia ([place]) 02/15/2020  . CAP (community acquired pneumonia) 02/15/2020  . Acute metabolic encephalopathy [id number]  . Diabetes mellitus without complication ([place])   . GERD (gastroesophageal reflux disease)   . Hypertension   . AKI (acute kidney injury) ([place])   . Elevated troponin   . Sepsis ([place])   . Acute respiratory failure with hypoxia ([place])   . Urinary urgency 01/05/2020  . Grief reaction 09/18/2019  . Swallowing dysfunction 07/18/2019  . Cough 02/21/2019  . Fever, intermittent 09/01/2018  . Pre-syncope 02/07/2018  . Orthostatic hypotension 01/26/2018  . Episodic weakness 01/26/2018  . Medicare annual wellness visit, subsequent 06/29/2017  . 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 Preoperative examination 04/27/2017  . Carpal tunnel syndrome of right wrist 11/13/2016  . 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 Obstructive sleep apnea 01/01/2016  . Carcinoma of upper-outer quadrant of left breast in female, estrogen receptor negative ([place]) 11/21/2015  . Hypomagnesemia 08/28/2015  . Initial Medicare annual wellness visit 10/16/2014  . Estrogen deficiency 10/16/2014  . Colon cancer screening 10/16/2014  . Controlled type 2 diabetes mellitus without complication ([place]) [id number]  . RLS (restless legs syndrome) 02/21/2014  . Chronic neck pain 07/25/2010  . Depression with anxiety 07/07/2010  . Routine general medical examination at a health care facility 06/26/2010  . B12 deficiency 09/04/2009  . Anemia, iron deficiency 08/29/2009  . Anxiety state 08/26/2009  . GASTROPARESIS 08/26/2009  . Vitamin D deficiency 07/19/2008  . Disorder of bone and cartilage 07/19/2007  . Hyperlipidemia 07/18/2007  . EDEMA 07/18/2007  . Skin lesion, superficial 06/02/2007   Called patient to get an update on her BG readings. She states that she does not know how to read or work her machine and has not been writing the readings down.  She was also recently released from the hospital due to aspiration pneumonia and is having a hard time swallowing the Janumet. She agreed to write down her BGL and I will call her back to review on 02/27/20.    Follow-Up:  Pharmacist Review   [name], CPP notified  Margaretmary Dys, [place] Pharmacy Assistant [phone]

## 2020-02-27 ENCOUNTER — Telehealth: Payer: Self-pay

## 2020-02-27 ENCOUNTER — Ambulatory Visit: Payer: PPO | Admitting: Family Medicine

## 2020-02-27 DIAGNOSIS — Z0289 Encounter for other administrative examinations: Secondary | ICD-10-CM

## 2020-02-27 NOTE — Chronic Care Management (AMB) (Addendum)
Chronic Care Management Pharmacy Assistant   Name: Suzanne Strong  MRN: [id number] DOB: December 02, 1945  Reason for Encounter: Disease State  Patient Questions:  1.  Have you seen any other providers since your last visit? No  2.  Any changes in your medicines or health? No   PCP : Tower, [name], MD  Allergies:   Allergies  Allergen Reactions   Amoxicillin Swelling    REACTION: rash, swelling Has patient had a PCN reaction causing immediate rash, facial/tongue/throat swelling, SOB or lightheadedness with hypotension: yes Has patient had a PCN reaction causing severe rash involving mucus membranes or skin necrosis: no Has patient had a PCN reaction that required hospitalization: no Has patient had a PCN reaction occurring within the last 10 years: yes If all of the above answers are "NO", then may proceed with Cephalosporin use.    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    amitriptyline (ELAVIL) 50 MG tablet Take 1 tablet (50 mg total) by mouth at bedtime.    amLODipine (NORVASC) 5 MG tablet Take 1 tablet (5 mg total) by mouth daily.    atorvastatin (LIPITOR) 10 MG tablet TAKE 1 TABLET(10 MG) BY MOUTH DAILY    b complex vitamins tablet Take 1 tablet by mouth daily.    busPIRone (BUSPAR) 15 MG tablet Take 1 tablet (15 mg total) by mouth 2 (two) times daily.    Cholecalciferol (VITAMIN D-1000 MAX ST) 25 MCG (1000 UT) tablet Take 2,000 Units by mouth daily.     glipiZIDE (GLUCOTROL) 10 MG tablet Take 10 mg by mouth 2  (two) times daily before a meal.  02/15/2020: Patient takes 5 mg twice daily   glucose blood test strip To check blood glucose once daily and prn for diabetes    JANUMET XR 50-1000 MG TB24 Take 1 tablet by mouth 2 (two) times daily.     lidocaine-prilocaine (EMLA) cream Apply 1 application topically as needed. Apply to port when going through Chemo    Multiple Vitamin (MULTIVITAMIN) tablet Take 1 tablet by mouth daily.    Multiple Vitamins-Minerals (WOMENS MULTIVITAMIN PO) Take by mouth. (Patient not taking: No sig reported)    omeprazole (PRILOSEC) 20 MG capsule TAKE 1 CAPSULE(20 MG) BY MOUTH AT BEDTIME    pregabalin (LYRICA) 75 MG capsule Take 1 capsule (75 mg total) by mouth daily.    rOPINIRole (REQUIP) 1 MG tablet TAKE 1/2 TABLET BY MOUTH EVERY MORNING and TAKE THREE TABLETS BY MOUTH EVERYDAY AT BEDTIME (Patient taking differently: Take 0.5-3 mg by mouth 2 (two) times daily. TAKE 1/2 TABLET BY MOUTH EVERY MORNING and TAKE THREE TABLETS BY MOUTH EVERYDAY AT BEDTIME)    sertraline (ZOLOFT) 100 MG tablet TAKE 2 TABLETS BY MOUTH EVERY DAY (Patient taking differently: Take 200 mg by mouth daily. TAKE 2 TABLETS BY MOUTH EVERY DAY)    Suvorexant (BELSOMRA) 20 MG TABS Take 20 mg by mouth at bedtime.    traZODone (DESYREL) 100 MG  tablet Take 100 mg by mouth at bedtime.    Facility-Administered Encounter Medications as of 02/27/2020  Medication   sodium chloride flush (NS) 0.9 % injection 10 mL    Current Diagnosis: Patient Active Problem List   Diagnosis Date Noted   Aspiration pneumonia ([place]) 02/15/2020   CAP (community acquired pneumonia) 14/97/0263   Acute metabolic encephalopathy 78/58/8502   Diabetes mellitus without complication (HCC)    GERD (gastroesophageal reflux disease)    Hypertension    AKI (acute kidney injury) ([place])    Elevated troponin    Sepsis ([place])    Acute respiratory failure with hypoxia ([place])    Urinary urgency 01/05/2020   Grief reaction 09/18/2019   Swallowing  dysfunction 07/18/2019   Cough 02/21/2019   Fever, intermittent 09/01/2018   Pre-syncope 02/07/2018   Orthostatic hypotension 01/26/2018   Episodic weakness 01/26/2018   Medicare annual wellness visit, subsequent 06/29/2017   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   Preoperative examination 04/27/2017   Carpal tunnel syndrome of right wrist 11/13/2016   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[place]) History of  02/17/2016   Obstructive sleep apnea 01/01/2016   Carcinoma of upper-outer quadrant of left breast in female, estrogen receptor negative ([place]) 11/21/2015   Hypomagnesemia 08/28/2015   Initial Medicare annual wellness visit 10/16/2014   Estrogen deficiency 10/16/2014   Colon cancer screening 10/16/2014   Controlled type 2 diabetes mellitus without complication ([place]) 77/41/2878   RLS (restless legs syndrome) 02/21/2014   Chronic neck pain 07/25/2010   Depression with anxiety 07/07/2010   Routine general medical examination at a health care facility 06/26/2010   B12 deficiency 09/04/2009   Anemia, iron deficiency 08/29/2009   Anxiety state 08/26/2009   GASTROPARESIS 08/26/2009   Vitamin D deficiency 07/19/2008   Disorder of bone and cartilage 07/19/2007   Hyperlipidemia 07/18/2007   EDEMA 07/18/2007   Skin lesion, superficial 06/02/2007   Followed up with patient regarding blood sugar readings. All the following readings were fasting. 02/21/20 - 163 02/22/20 - 132 02/23/20 - 94 02/24/20 -86 02/25/20 - 74 02/26/20 - 95 02/27/20 -97  Patient states that she felt very lethargic when her blood sugar was 74. She states once she ate she did feel better.  Follow-Up:  Pharmacist Review   [name], CPP notified  Margaretmary Dys, [place] [phone]  Reviewed BG log - borderline low readings. We could consider cutting back on medications further. Scheduled CCM visit 03/22/20 to discuss changes.  [name], PharmD Clinical Pharmacist [facility] at  [facility] [phone]

## 2020-03-04 ENCOUNTER — Ambulatory Visit: Payer: PPO | Admitting: Family Medicine

## 2020-03-05 ENCOUNTER — Encounter: Payer: Self-pay | Admitting: Family Medicine

## 2020-03-05 ENCOUNTER — Telehealth (INDEPENDENT_AMBULATORY_CARE_PROVIDER_SITE_OTHER): Payer: PPO | Admitting: Family Medicine

## 2020-03-05 VITALS — Ht 66.0 in

## 2020-03-05 DIAGNOSIS — G63 Polyneuropathy in diseases classified elsewhere: Secondary | ICD-10-CM | POA: Diagnosis not present

## 2020-03-05 DIAGNOSIS — E119 Type 2 diabetes mellitus without complications: Secondary | ICD-10-CM

## 2020-03-05 DIAGNOSIS — Z87448 Personal history of other diseases of urinary system: Secondary | ICD-10-CM | POA: Diagnosis not present

## 2020-03-05 DIAGNOSIS — R1310 Dysphagia, unspecified: Secondary | ICD-10-CM | POA: Diagnosis not present

## 2020-03-05 DIAGNOSIS — I10 Essential (primary) hypertension: Secondary | ICD-10-CM | POA: Diagnosis not present

## 2020-03-05 DIAGNOSIS — N179 Acute kidney failure, unspecified: Secondary | ICD-10-CM

## 2020-03-05 DIAGNOSIS — J690 Pneumonitis due to inhalation of food and vomit: Secondary | ICD-10-CM | POA: Diagnosis not present

## 2020-03-05 DIAGNOSIS — C801 Malignant (primary) neoplasm, unspecified: Secondary | ICD-10-CM

## 2020-03-05 NOTE — Assessment & Plan Note (Signed)
This occurred in hospital due to dehydration during pneumonia and then resolved Nl cr at d/c

## 2020-03-05 NOTE — Assessment & Plan Note (Signed)
Pt was inst to start checking bp at home Tolerates amlodipine well  No side effects  Will let us know if bp rises above 140/90 inst to eat lower salt/less processed food

## 2020-03-05 NOTE — Assessment & Plan Note (Signed)
Doing better with dysphagia 1 diet  Ref made to GI to consider egd as well  Has worked with speech tx

## 2020-03-05 NOTE — Assessment & Plan Note (Signed)
Had recent endoc f/u and working on diet

## 2020-03-05 NOTE — Assessment & Plan Note (Signed)
Due to encephalopathy with most recent hospitalization - her lyrica dose was reduced  Some trouble sleeping Balance is better Will continue to watch

## 2020-03-05 NOTE — Progress Notes (Signed)
Virtual Visit via Video Note  I connected with Suzanne Strong on 03/05/20 at  2:00 PM EST by a video enabled telemedicine application and verified that I am speaking with the correct person using two identifiers.  Location: Patient: home Provider: office    I discussed the limitations of evaluation and management by telemedicine and the availability of in person appointments. The patient expressed understanding and agreed to proceed.  Parties involved in encounter  Patient: Suzanne Strong   Provider:  [name] MD   History of Present Illness: Pt presents for f/u of hospitalization from 12/30 to 03/03/20 for aspiration and pneumonia  She presented with sob/ cough/fever and weakness Has hypoxic and put on 2L 02 covid pcr negative  cxr showed patchy infiltrate in LML and LLL  Speech tx recommended dysphagia 1 diet for recurren aspiration   hosp course Admitted From: Home Disposition:  Home  Discharge Condition:Stable CODE STATUS:FULL Diet recommendation:  Dysphagia 1  Brief/Interim Summary:  Patient is a 75 year old female with history of hypertension, hyperlipidemia, diabetes type 2, GERD, depression/anxiety, tonsillar cancer, restless leg syndrome, left breast cancer, gastroparesis, anemia who presents with complaints of cough, shortness of breath and fever. She was feeling very weak, also complained of intermittent chronic diarrhea. On presentation she was hypoxic on room air which improved with 2 L of oxygen. Covid PCR negative. She had mild elevated leukocytes on presentation. Chest x-ray showed patchy infiltrate in the left middle lobe and left lower lobe. Patient was admitted for management of multifocal pneumonia most likely from aspiration. Speech therapy consulted. Recommended dysphagia diet. Her respiratory status has improved and she is maintaining her saturation on room air. She is medically stable for discharge to home today.  Following problems were addressed  during hospitalization:   Acute respiratory with hypoxia:Presented with dyspnea, weakness, hypoxia. Currently respiratory status stable. Maintaining saturation on room air.  Aspiration pneumonia:Presented with fever, cough. Chest x-ray as above. Started on ceftriaxone, azithromycin and Flagyl.Blood cultures, sputum cultures have not shown any growth. Speech therapy following and recommended dysphagia 1 diet. GI was also consulted during this hospitalization. GI wants to follow her as an outpatient for possible EGD for evaluation of dysphagia Abx changed to oral. Sepsis ruled out.  Per pt-given list of foods not to have  Avoids carbonation as well  She is also ruling things out  Like beef   Altered mental status: Most likely from acutemetabolic encepaholpathyfrom pneumonia. CT head did not showany acute intracranial normalities. Polypharmacy could also have contributed. Currently she is alert and oriented.Dose of lyrica reduced  Having trouble sleeping now   Tonsillar cancer/breast cancer:Status post lumpectomy, radiation and chemotherapy. We recommend follow-up with oncology as an outpatient  Hyperlipidemia: Continue Lipitor  Depression/anxiety:On Xanax, amitriptyline, BuSpar, Requip, Zoloft, trazodone at home .Also on Suvorezant and Lyrica.Patient was confused on presentation. Could be from polypharmacy. Dose of Lyrica reduced  Diabetes type 2:A1c of 8, poorly controlled. On glipizide, Janumet at home. Monitor blood sugars at home. Resume home regimen on discharge  GERD:Continue Protonix  Hypertension: Started on amlodipine. As per her, her PCP has started on losartan.   [id number].Mild elevated creatinine on presentation. Her baseline creatinine is normal.   Mild elevated troponin:Denies any chest pain. Likely due to demand ischemia. No intervention planned    Lab Results  Component Value Date   WBC 5.7 02/17/2020   HGB 10.5 (L) 02/17/2020   HCT  32.9 (L) 02/17/2020   MCV 87.7 02/17/2020   PLT 231 02/17/2020  Lab Results  Component Value Date   CREATININE 0.80 02/17/2020   BUN 14 02/17/2020   NA 139 02/17/2020   K 3.9 02/17/2020   CL 100 02/17/2020   CO2 27 02/17/2020   Hb is baseline  Since she came home has done pretty well  Tired very easily   No longer has a cough  Is eating/swallowing a different way (holds her breath to swallow and repeats and then coughs on purpose)  This seems to be helping  Not sob  No chest tightness  No wheezing or gurgling  Her speech is improved also   Trouble sleeping -only 2 h at a time  She takes belsorma for sleep   Amlodipine for HTN-is tolerating  Diet is fair (is avoiding extra salt)   Not a lot of sugar since the episode    Balance is better since she came home   Not back to work yet- not ready to drive   Patient Active Problem List   Diagnosis Date Noted  . Aspiration pneumonia ([place]) 02/15/2020  . Diabetes mellitus without complication ([place])   . GERD (gastroesophageal reflux disease)   . Hypertension   . Elevated troponin   . Urinary urgency 01/05/2020  . Grief reaction 09/18/2019  . Swallowing dysfunction 07/18/2019  . Fever, intermittent 09/01/2018  . Pre-syncope 02/07/2018  . Orthostatic hypotension 01/26/2018  . Episodic weakness 01/26/2018  . Medicare annual wellness visit, subsequent 06/29/2017  . 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 Preoperative examination 04/27/2017  . Carpal tunnel syndrome of right wrist 11/13/2016  . 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 Obstructive sleep apnea 01/01/2016  . Carcinoma of upper-outer quadrant of left breast in female, estrogen receptor negative ([place]) 11/21/2015  . Hypomagnesemia 08/28/2015  . Initial Medicare annual wellness visit 10/16/2014  . Estrogen deficiency 10/16/2014  . Colon cancer screening 10/16/2014  . Controlled type 2 diabetes mellitus without complication ([place])  [id number]  . RLS (restless legs syndrome) 02/21/2014  . Chronic neck pain 07/25/2010  . Depression with anxiety 07/07/2010  . Routine general medical examination at a health care facility 06/26/2010  . B12 deficiency 09/04/2009  . Anemia, iron deficiency 08/29/2009  . Anxiety state 08/26/2009  . GASTROPARESIS 08/26/2009  . Vitamin D deficiency 07/19/2008  . Disorder of bone and cartilage 07/19/2007  . Hyperlipidemia 07/18/2007  . EDEMA 07/18/2007  . Skin lesion, superficial 06/02/2007   Past Medical History:  Diagnosis Date  . Anemia    iron deficiency and B12 deficiency  . Anxiety   . Breast cancer ([place]) 02/25/2015   upper-outer quadrant of left female breast, Triple Negative. Neo-adjuvant chemo with complete pathologic response.   . Cervical dysplasia    conization  . Diabetes mellitus without complication ([place])   . Diverticulosis   . Fever 04/11/2015  . Gastroparesis    related to previous radiation tx  . GERD (gastroesophageal reflux disease)   . Hyperlipidemia   . Hypertension   . Neuropathy    legs/feet, S/P chemo meds  . Personal history of chemotherapy   . Personal history of radiation therapy 2017   LEFT lumpectomy w/ radiation  . RLS (restless legs syndrome)   . Shingles    Hx of  . 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[place]) 2006  . Wears dentures    partial bottom   Past Surgical History:  Procedure Laterality Date  . APPENDECTOMY    . BREAST BIOPSY Left 02/25/2015   INVASIVE MAMMARY CARCINOMA,Triple negative.  [name] BREAST  CYST ASPIRATION    . BREAST EXCISIONAL BIOPSY Left 12/2015   surgery  . BREAST LUMPECTOMY WITH NEEDLE LOCALIZATION Left 10/02/2015   Procedure: BREAST LUMPECTOMY WITH NEEDLE LOCALIZATION;  Surgeon: [name], MD;  Location: [facility];  Service: General;  Laterality: Left;  . BREAST LUMPECTOMY WITH SENTINEL LYMPH NODE BIOPSY Left 10/02/2015   Procedure: LEFT BREAST WIDE EXCISION WITH SENTINEL LYMPH NODE BX;  Surgeon: [name], MD;  Location:  [facility];  Service: General;  Laterality: Left;  . BREAST SURGERY     breast biopsy benign  . CARPAL TUNNEL RELEASE Right 05/19/2017   Procedure: CARPAL TUNNEL RELEASE;  Surgeon: Earnestine Leys, MD;  Location: [facility];  Service: Orthopedics;  Laterality: Right;  . CATARACT EXTRACTION W/PHACO Right 10/26/2018   Procedure: CATARACT EXTRACTION PHACO AND INTRAOCULAR LENS PLACEMENT ([place]) right diabetic;  Surgeon: Leandrew Koyanagi, MD;  Location: [facility];  Service: Ophthalmology;  Laterality: Right;  Diabetic - oral meds cancer center been notified and will come  . CATARACT EXTRACTION W/PHACO Left 11/16/2018   Procedure: CATARACT EXTRACTION PHACO AND INTRAOCULAR LENS PLACEMENT ([facility]) LEFT DIABETIC  01:01.0  17.0%  10.37;  Surgeon: Leandrew Koyanagi, MD;  Location: [facility];  Service: Ophthalmology;  Laterality: Left;  Diabetic - oral meds Port-a-cath  . CHOLECYSTECTOMY    . Cologuard  07/22/2016   Negative  . ESOPHAGOGASTRODUODENOSCOPY  07/2009   normal with few gastric polyps  . MOLE REMOVAL    . PORT-A-CATH REMOVAL    . PORTACATH PLACEMENT    . PORTACATH PLACEMENT Right 03/12/2015   Procedure: INSERTION PORT-A-CATH;  Surgeon: [name], MD;  Location: [facility];  Service: General;  Laterality: Right;  . RADICAL NECK DISSECTION    . TONSILLECTOMY     cancer treated with chemo and radiation  . TRIGGER FINGER RELEASE Right 05/19/2017   Procedure: RELEASE TRIGGER FINGER/A-1 PULLEY ring finger;  Surgeon: Earnestine Leys, MD;  Location: [facility];  Service: Orthopedics;  Laterality: Right;   Social History   Tobacco Use  . Smoking status: Former Smoker    Packs/day: 0.50    Years: 6.00    Pack years: 3.00    Types: Cigarettes    Quit date: 03/10/1984    Years since quitting: 36.0  . Smokeless tobacco: Never Used  Vaping Use  . Vaping Use: Never used  Substance Use Topics  . Alcohol use: No    Alcohol/week: 0.0 standard drinks  . Drug use: No   Family  History  Problem Relation Age of Onset  . Osteoporosis Mother   . Hyperlipidemia Mother   . Depression Mother   . Cancer Mother        skin cancer ? basal cell and lung ca heavy smoker  . Alcohol abuse Father   . Cancer Father        skin CA ? basal cell  . Heart disease Father        CHF  . Hyperlipidemia Brother   . Hypertension Brother   . Breast cancer Neg Hx    Allergies  Allergen Reactions  . Amoxicillin Swelling    REACTION: rash, swelling Has patient had a PCN reaction causing immediate rash, facial/tongue/throat swelling, SOB or lightheadedness with hypotension: yes Has patient had a PCN reaction causing severe rash involving mucus membranes or skin necrosis: no Has patient had a PCN reaction that required hospitalization: no Has patient had a PCN reaction occurring within the last 10 years: yes If all  of the above answers are "NO", then may proceed with Cephalosporin use.   [name] Fluconazole Rash    REACTION: hives  . Difuleron [Ferrous Fumarate-Dss]   . 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 albuterol (VENTOLIN HFA) 108 (90 Base) MCG/ACT inhaler INHALE TWO PUFFS BY MOUTH EVERY SIX HOURS AS NEEDED 8.5 g 3  . ALPRAZolam (XANAX) 0.5 MG tablet Take 1 tablet (0.5 mg total) by mouth 2 (two) times daily as needed for anxiety or sleep. 60 tablet 5  . amitriptyline (ELAVIL) 50 MG tablet Take 1 tablet (50 mg total) by mouth at bedtime. 90 tablet 3  . amLODipine (NORVASC) 5 MG tablet Take 1 tablet (5 mg total) by mouth daily. 30 tablet 1  . atorvastatin (LIPITOR) 10 MG tablet TAKE 1 TABLET(10 MG) BY MOUTH DAILY 90 tablet 3  . b complex vitamins tablet Take 1 tablet by mouth daily.    . busPIRone (BUSPAR) 15 MG tablet Take 1 tablet (15 mg total) by mouth 2 (two) times daily. 60 tablet 5  . Cholecalciferol (VITAMIN D-1000 MAX  ST) 25 MCG (1000 UT) tablet Take 2,000 Units by mouth daily.     [name] glipiZIDE (GLUCOTROL) 10 MG tablet Take 10 mg by mouth 2 (two) times daily before a meal.     . glucose blood test strip To check blood glucose once daily and prn for diabetes 100 each 3  . JANUMET XR 50-1000 MG TB24 Take 1 tablet by mouth 2 (two) times daily.   0  . lidocaine-prilocaine (EMLA) cream Apply 1 application topically as needed. Apply to port when going through Chemo    . Multiple Vitamin (MULTIVITAMIN) tablet Take 1 tablet by mouth daily.    . Multiple Vitamins-Minerals (WOMENS MULTIVITAMIN PO) Take by mouth. (Patient not taking: No sig reported)    . omeprazole (PRILOSEC) 20 MG capsule TAKE 1 CAPSULE(20 MG) BY MOUTH AT BEDTIME 90 capsule 3  . pregabalin (LYRICA) 75 MG capsule Take 1 capsule (75 mg total) by mouth daily. 30 capsule 1  . rOPINIRole (REQUIP) 1 MG tablet TAKE 1/2 TABLET BY MOUTH EVERY MORNING and TAKE THREE TABLETS BY MOUTH EVERYDAY AT BEDTIME (Patient taking differently: Take 0.5-3 mg by mouth 2 (two) times daily. TAKE 1/2 TABLET BY MOUTH EVERY MORNING and TAKE THREE TABLETS BY MOUTH EVERYDAY AT BEDTIME) 315 tablet 5  . sertraline (ZOLOFT) 100 MG tablet TAKE 2 TABLETS BY MOUTH EVERY DAY (Patient taking differently: Take 200 mg by mouth daily. TAKE 2 TABLETS BY MOUTH EVERY DAY) 180 tablet 1  . Suvorexant (BELSOMRA) 20 MG TABS Take 20 mg by mouth at bedtime. 41 tablet 0  . traZODone (DESYREL) 100 MG tablet Take 100 mg by mouth at bedtime.     Current Facility-Administered Medications on File Prior to Visit  Medication Dose Route Frequency Provider Last Rate Last Admin  . sodium chloride flush (NS) 0.9 % injection 10 mL  10 mL Intravenous PRN Cammie Sickle, MD   10 mL at 09/11/15 0845   Review of Systems  Constitutional: Negative for chills, fever and malaise/fatigue.       Fatigue-improving   HENT: Negative for congestion, ear pain, sinus pain and sore throat.   Eyes: Negative for blurred vision,  discharge and redness.  Respiratory: Negative for cough, sputum production, shortness of  breath, wheezing and stridor.   Cardiovascular: Negative for chest pain, palpitations and leg swelling.  Gastrointestinal: Negative for abdominal pain, diarrhea, nausea and vomiting.  Musculoskeletal: Negative for myalgias.  Skin: Negative for rash.  Neurological: Negative for dizziness and headaches.    Observations/Objective: Patient appears well, in no distress Weight is baseline  No facial swelling or asymmetry Hoarse voice -baseline  No obvious tremor or mobility impairment Moving neck and UEs normally Able to hear the call well  No cough or shortness of breath during interview  Talkative and mentally sharp with no cognitive changes No skin changes on face or neck , no rash or pallor Affect is normal    Assessment and Plan: Problem List Items Addressed This Visit      Cardiovascular and Mediastinum   Hypertension    Pt was inst to start checking bp at home Tolerates amlodipine well  No side effects  Will let us know if bp rises above 140/90 inst to eat lower salt/less processed food        Respiratory   Aspiration pneumonia ([place]) - Primary    Much improved clinically  Reviewed hospital records, lab results and studies in detail  No longer hypoxic or sob at all Now on dysphagia 1 diet and has a new swallowing technique and thinks it is working  Was seen by GI in hospital, will ref to GI to consider EGD        Endocrine   Controlled type 2 diabetes mellitus without complication ([place])    Had recent endoc f/u and working on diet         Nervous and Auditory   Neuropathy associated with cancer ([place])    Due to encephalopathy with most recent hospitalization - her lyrica dose was reduced  Some trouble sleeping Balance is better Will continue to watch        Genitourinary   RESOLVED: AKI (acute kidney injury) ([place])    This occurred in hospital due to dehydration during  pneumonia and then resolved Nl cr at d/c        Other   Swallowing dysfunction    Doing better with dysphagia 1 diet  Ref made to GI to consider egd as well  Has worked with speech tx      Relevant Orders   Ambulatory referral to Gastroenterology       Follow Up Instructions: Goal for blood pressure is less than 140 on top and 90 on the bottom   (let us know if blood pressure is above that)  Drink lots of fluids  Gradually increase your activity within your home If cough or shortness of breath return please let us know  Follow the guidelines for diet and swallowing as you have Glad you are improving!   I discussed the assessment and treatment plan with the patient. The patient was provided an opportunity to ask questions and all were answered. The patient agreed with the plan and demonstrated an understanding of the instructions.   The patient was advised to call back or seek an in-person evaluation if the symptoms worsen or if the condition fails to improve as anticipated.     [name], MD

## 2020-03-05 NOTE — Assessment & Plan Note (Addendum)
Much improved clinically  Reviewed hospital records, lab results and studies in detail  No longer hypoxic or sob at all Now on dysphagia 1 diet and has a new swallowing technique and thinks it is working  Was seen by GI in hospital, will ref to GI to consider EGD

## 2020-03-05 NOTE — Patient Instructions (Addendum)
Goal for blood pressure is less than 140 on top and 90 on the bottom   (let us know if blood pressure is above that)  Drink lots of fluids  Gradually increase your activity within your home If cough or shortness of breath return please let us know  Follow the guidelines for diet and swallowing as you have Glad you are improving!

## 2020-03-06 ENCOUNTER — Other Ambulatory Visit: Payer: Self-pay | Admitting: Psychiatry

## 2020-03-06 DIAGNOSIS — F411 Generalized anxiety disorder: Secondary | ICD-10-CM

## 2020-03-06 DIAGNOSIS — G4700 Insomnia, unspecified: Secondary | ICD-10-CM

## 2020-03-06 NOTE — Telephone Encounter (Signed)
I left a message with patient to call to schedule appointment

## 2020-03-12 ENCOUNTER — Telehealth: Payer: Self-pay

## 2020-03-12 NOTE — Chronic Care Management (AMB) (Addendum)
Chronic Care Management Pharmacy Assistant   Name: SHAUN RUBEL  MRN: [id number] DOB: 12/27/1945  Reason for Encounter: Medication Review- Medication adherence and delivery coordination  Patient Questions:  1.  Have you seen any other providers since your last visit? Yes 03/05/20- Video Visit with Dr. [name]- PCP   2.  Any changes in your medicines or health? Yes - Lyrica reduced from 150 mg to 75 mg BID  PCP : [name], MD  Allergies:   Allergies  Allergen Reactions   Amoxicillin Swelling    REACTION: rash, swelling Has patient had a PCN reaction causing immediate rash, facial/tongue/throat swelling, SOB or lightheadedness with hypotension: yes Has patient had a PCN reaction causing severe rash involving mucus membranes or skin necrosis: no Has patient had a PCN reaction that required hospitalization: no Has patient had a PCN reaction occurring within the last 10 years: yes If all of the above answers are "NO", then may proceed with Cephalosporin use.    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    amLODipine (NORVASC) 5 MG tablet Take 1 tablet (5 mg total) by mouth daily.    atorvastatin (LIPITOR) 10 MG tablet TAKE 1 TABLET(10 MG) BY MOUTH DAILY    b complex vitamins tablet Take 1 tablet by mouth daily.    busPIRone (BUSPAR) 15 MG tablet Take 1 tablet (15 mg total) by mouth 2 (two) times daily.    Cholecalciferol (VITAMIN D-1000 MAX ST)  25 MCG (1000 UT) tablet Take 2,000 Units by mouth daily.     glipiZIDE (GLUCOTROL) 10 MG tablet Take 10 mg by mouth 2 (two) times daily before a meal.  02/15/2020: Patient takes 5 mg twice daily   glucose blood test strip To check blood glucose once daily and prn for diabetes    JANUMET XR 50-1000 MG TB24 Take 1 tablet by mouth 2 (two) times daily.     lidocaine-prilocaine (EMLA) cream Apply 1 application topically as needed. Apply to port when going through Chemo    Multiple Vitamin (MULTIVITAMIN) tablet Take 1 tablet by mouth daily.    Multiple Vitamins-Minerals (WOMENS MULTIVITAMIN PO) Take by mouth. (Patient not taking: No sig reported)    omeprazole (PRILOSEC) 20 MG capsule TAKE 1 CAPSULE(20 MG) BY MOUTH AT BEDTIME    pregabalin (LYRICA) 75 MG capsule Take 1 capsule (75 mg total) by mouth daily.    rOPINIRole (REQUIP) 1 MG tablet TAKE 1/2 TABLET BY MOUTH EVERY MORNING and TAKE THREE TABLETS BY MOUTH EVERYDAY AT BEDTIME (Patient taking differently: Take 0.5-3 mg by mouth 2 (two) times daily. TAKE 1/2 TABLET BY MOUTH EVERY MORNING and TAKE THREE TABLETS BY MOUTH EVERYDAY AT BEDTIME)    sertraline (ZOLOFT) 100 MG tablet TAKE TWO TABLETS BY MOUTH EVERY MORNING    Suvorexant (BELSOMRA) 20 MG TABS Take 20 mg by mouth at bedtime.    traZODone (DESYREL)  100 MG tablet TAKE ONE TABLET BY MOUTH EVERYDAY AT BEDTIME    Facility-Administered Encounter Medications as of 03/12/2020  Medication   sodium chloride flush (NS) 0.9 % injection 10 mL    Current Diagnosis: Patient Active Problem List   Diagnosis Date Noted   Aspiration pneumonia ([place]) 02/15/2020   Diabetes mellitus without complication (HCC)    GERD (gastroesophageal reflux disease)    Hypertension    Elevated troponin    Urinary urgency 01/05/2020   Grief reaction 09/18/2019   Swallowing dysfunction 07/18/2019   Fever, intermittent 09/01/2018   Pre-syncope 02/07/2018   Orthostatic hypotension 01/26/2018   Episodic weakness 01/26/2018    Medicare annual wellness visit, subsequent 06/29/2017   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   Preoperative examination 04/27/2017   Carpal tunnel syndrome of right wrist 11/13/2016   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  02/17/2016   Obstructive sleep apnea 01/01/2016   Carcinoma of upper-outer quadrant of left breast in female, estrogen receptor negative ([place]) 11/21/2015   Hypomagnesemia 08/28/2015   Initial Medicare annual wellness visit 10/16/2014   Estrogen deficiency 10/16/2014   Colon cancer screening 10/16/2014   Controlled type 2 diabetes mellitus without complication ([place]) 37/16/9678   RLS (restless legs syndrome) 02/21/2014   Chronic neck pain 07/25/2010   Depression with anxiety 07/07/2010   Routine general medical examination at a health care facility 06/26/2010   B12 deficiency 09/04/2009   Anemia, iron deficiency 08/29/2009   Anxiety state 08/26/2009   GASTROPARESIS 08/26/2009   Vitamin D deficiency 07/19/2008   Disorder of bone and cartilage 07/19/2007   Hyperlipidemia 07/18/2007   EDEMA 07/18/2007   Skin lesion, superficial 06/02/2007   Reviewed chart for medication changes ahead of medication coordination call.  03/05/20- Video Visit with Dr. [name]- PCP. No consults or hospital visits since last care coordination call/Pharmacist visit.  No medication changes indicated.  BP Readings from Last 3 Encounters:  02/17/20 (!) 173/83  01/05/20 128/70  11/15/19 112/70    Lab Results  Component Value Date   HGBA1C 6.1 (H) 02/15/2020     Patient obtains medications through Adherence Packaging  30 Days   Last adherence delivery included: 30 DS packs and vials 02/19/20 Packs  Alpha lipoic acid 200 mg - 1 tablet daily (bedtime) Amitriptyline 50 mg - 1 tablet daily (bedtime) Atorvastatin 10 mg- 1 tablet daily (breakfast) Belsomra 20 mg tabs-1 tab daily at bedtime (bedtime) Buspirone 15 mg- 1 tablet twice daily (breakfast and bedtime)  Janumet XR  50/1000 mg - 1 tablet two times a day (breakfast and evening meal)  Omeprazole 20 mg - 1 tablet daily (bedtime) Ropinirole 1 mg -  tablet once daily (breakfast) and 3 tablets once nightly (bedtime) Sertraline 100 mg - 2 tablets daily (breakfast) Trazodone 100 mg - 1 tablet daily (bedtime) Vials Albuterol inhaler -Inhale 2 puffs every 6 hours as needed-  Alprazolam 0.5 mg - 1 tablet daily as needed OneTouch ultra test strips - use to check blood sugar once daily  Patient declined the following medications last month: Centrum Silver Multivitamin - 1 tablet daily (breakfast)- OTC patient buys Pregabalin 150 mg - 1 twice daily (breakfast and bedtime) - Rx was sent to [facility] Vitamin B-Complex - 1 tablet daily ( breakfast)- Pt buys OTC Vitamin D3 50 mcg - 1 tablet twice daily (breakfast and bedtime) Pt buys OTC Glipizide 5 mg BID - Dr. Gabriel Carina reduced dose to 5mg  BID so she has been cutting her 10mg  tablets in half. She  states she has about 45 pills left as of 02/14/20. Dose changed 02/12/20.   Patient is due for next adherence delivery on: 03/19/20  Called patient and reviewed medications and coordinated delivery. Patient states that she is checking her blood sugar periodically and has not been checking her blood pressure. States fasting blood glucose readings are as follows:  03/11/20- 100 03/07/20- 70- states she was able to eat yogurt and blood sugar came up. 02/29/20- 110 02/26/20- 120  This delivery to include: Alpha lipoic acid 200 mg - 1 tablet daily (bedtime) Amitriptyline 50 mg - 1 tablet daily (bedtime) Atorvastatin 10 mg- 1 tablet daily (breakfast) Belsomra 20 mg tabs-1 tab daily at bedtime (bedtime) Buspirone 15 mg- 1 tablet twice daily (breakfast and bedtime)  Janumet XR 50/1000 mg - 1 tablet two times a day (breakfast and evening meal)  Omeprazole 20 mg - 1 tablet daily (bedtime) Ropinirole 1 mg -  tablet once daily (breakfast) and 3 tablets once nightly (bedtime) Sertraline  100 mg - 2 tablets daily (breakfast) Trazodone 100 mg - 1 tablet daily (bedtime) Vials Alprazolam 0.5 mg - 1 tablet daily as needed Lyrica 75 mg - 1 tablet BID  Patient will not need a short or acute fill  prior to adherence delivery.   Patient declined the following medications : Albuterol inhaler -Inhale 2 puffs every 6 hours as needed- Only uses PRN has 1 left Centrum Silver Multivitamin - 1 tablet daily (breakfast)- OTC patient buys Glipizide 5 mg BID - Dr. Gabriel Carina reduced dose to 5mg  BID so she has been cutting her 10mg  tablets in half. She states she has over 30 tablets left on 03/12/20 OneTouch ultra test strips - use to check blood sugar once daily Vitamin B-Complex - 1 tablet daily ( breakfast)- Pt buys OTC Vitamin D3 50 mcg - 1 tablet twice daily (breakfast and bedtime) Pt buys OTC  Patient needs refills for Belsomra- Requested 03/12/20 from Thayer Headings, NP  Confirmed delivery date of 03/19/20, advised patient that pharmacy will contact them the morning of delivery.  Follow-Up:  Care Coordination with Outside Provider, Coordination of Enhanced Pharmacy Services and Pharmacist Review   [name], CPP notified  Margaretmary Dys, [place] Assistant [phone]  I have reviewed the care management and care coordination activities outlined in this encounter and I am certifying that I agree with the content of this note. Will contact patient to review rule of 15 for hypoglycemia.   [name], PharmD Clinical Pharmacist [facility] at [facility] [phone]

## 2020-03-18 ENCOUNTER — Other Ambulatory Visit: Payer: Self-pay | Admitting: Psychiatry

## 2020-03-18 DIAGNOSIS — G4700 Insomnia, unspecified: Secondary | ICD-10-CM

## 2020-03-18 NOTE — Telephone Encounter (Signed)
Fasting BG ranging 75-100 this month after her glipizide was reduced from 10 gm BID to 5 mg per endocrinology on 02/12/20 due to hypoglycemia. She was to continue Janumet as prescribed. She reports difficulty swallowing Janumet this month. Would like to have a CCM visit to make sure pt is not having any further hypoglycemia and able to swallow tablets. Scheduled for Friday 03/22/20.   Lab Results  Component Value Date/Time   HGBA1C 6.1 (H) 02/15/2020 09:27 AM   HGBA1C 8.0 (H) 06/29/2017 09:02 AM   HGBA1C 7.3 12/09/2016 12:00 AM   [name], PharmD Clinical Pharmacist [facility] at [facility] [phone]

## 2020-03-19 ENCOUNTER — Telehealth: Payer: Self-pay

## 2020-03-19 NOTE — Chronic Care Management (AMB) (Addendum)
Chronic Care Management Pharmacy Assistant   Name: Suzanne Strong  MRN: [id number] DOB: [birth year]  Reason for Encounter: Medication Adherence and Delivery Coordination  PCP : [name], MD  Allergies:   Allergies  Allergen Reactions   Amoxicillin Swelling    REACTION: rash, swelling Has patient had a PCN reaction causing immediate rash, facial/tongue/throat swelling, SOB or lightheadedness with hypotension: yes Has patient had a PCN reaction causing severe rash involving mucus membranes or skin necrosis: no Has patient had a PCN reaction that required hospitalization: no Has patient had a PCN reaction occurring within the last 10 years: yes If all of the above answers are "NO", then may proceed with Cephalosporin use.    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    amLODipine (NORVASC) 5 MG tablet Take 1 tablet (5 mg total) by mouth daily.    atorvastatin (LIPITOR) 10 MG tablet TAKE 1 TABLET(10 MG) BY MOUTH DAILY    b complex vitamins tablet Take 1 tablet by mouth daily.    BELSOMRA 20 MG TABS TAKE ONE TABLET BY MOUTH AT bedtime    busPIRone (BUSPAR) 15 MG tablet Take 1 tablet (15 mg total) by mouth 2 (two) times daily.    Cholecalciferol (VITAMIN D-1000 MAX ST) 25 MCG (1000 UT) tablet Take 2,000 Units by mouth daily.     glipiZIDE (GLUCOTROL) 10 MG tablet Take 10 mg by mouth 2 (two) times daily before a meal.  02/15/2020: Patient takes 5 mg twice  daily   glucose blood test strip To check blood glucose once daily and prn for diabetes    JANUMET XR 50-1000 MG TB24 Take 1 tablet by mouth 2 (two) times daily.     lidocaine-prilocaine (EMLA) cream Apply 1 application topically as needed. Apply to port when going through Chemo    Multiple Vitamin (MULTIVITAMIN) tablet Take 1 tablet by mouth daily.    Multiple Vitamins-Minerals (WOMENS MULTIVITAMIN PO) Take by mouth. (Patient not taking: No sig reported)    omeprazole (PRILOSEC) 20 MG capsule TAKE 1 CAPSULE(20 MG) BY MOUTH AT BEDTIME    pregabalin (LYRICA) 75 MG capsule Take 1 capsule (75 mg total) by mouth daily.    rOPINIRole (REQUIP) 1 MG tablet TAKE 1/2 TABLET BY MOUTH EVERY MORNING and TAKE THREE TABLETS BY MOUTH EVERYDAY AT BEDTIME (Patient taking differently: Take 0.5-3 mg by mouth 2 (two) times daily. TAKE 1/2 TABLET BY MOUTH EVERY MORNING and TAKE THREE TABLETS BY MOUTH EVERYDAY AT BEDTIME)    sertraline (ZOLOFT) 100 MG tablet TAKE TWO TABLETS BY MOUTH EVERY MORNING    traZODone (DESYREL) 100 MG tablet TAKE ONE TABLET BY MOUTH EVERYDAY AT BEDTIME    Facility-Administered Encounter Medications as of 03/19/2020  Medication   sodium chloride flush (NS) 0.9 % injection 10 mL    Current Diagnosis: Patient Active Problem List   Diagnosis Date Noted   Aspiration  pneumonia ([place]) 02/15/2020   Diabetes mellitus without complication ([place])    GERD (gastroesophageal reflux disease)    Hypertension    Elevated troponin    Urinary urgency 01/05/2020   Grief reaction 09/18/2019   Swallowing dysfunction 07/18/2019   Fever, intermittent 09/01/2018   Pre-syncope 02/07/2018   Orthostatic hypotension 01/26/2018   Episodic weakness 01/26/2018   Medicare annual wellness visit, subsequent 06/29/2017   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   Preoperative examination 04/27/2017   Carpal tunnel syndrome of right wrist 11/13/2016   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  02/17/2016    Obstructive sleep apnea 01/01/2016   Carcinoma of upper-outer quadrant of left breast in female, estrogen receptor negative ([place]) 11/21/2015   Hypomagnesemia 08/28/2015   Initial Medicare annual wellness visit 10/16/2014   Estrogen deficiency 10/16/2014   Colon cancer screening 10/16/2014   Controlled type 2 diabetes mellitus without complication ([place]) 32/67/1245   RLS (restless legs syndrome) 02/21/2014   Chronic neck pain 07/25/2010   Depression with anxiety 07/07/2010   Routine general medical examination at a health care facility 06/26/2010   B12 deficiency 09/04/2009   Anemia, iron deficiency 08/29/2009   Anxiety state 08/26/2009   GASTROPARESIS 08/26/2009   Vitamin D deficiency 07/19/2008   Disorder of bone and cartilage 07/19/2007   Hyperlipidemia 07/18/2007   EDEMA 07/18/2007   Skin lesion, superficial 06/02/2007   Contacted Ms. Baltz due to [facility] not being able to reach patient for her delivery. She has an adherence delivery including all of her medications in packaging due 03/19/20 but the pharmacy has been unable to reach patient to coordinate delivery. Coordinated delivery and verified pharmacy will call to deliver today 03/20/20.  Follow-Up:  Comptroller and Pharmacist Review   [name], CPP notified  Margaretmary Dys, [place] Pharmacy Assistant [phone]

## 2020-03-22 ENCOUNTER — Other Ambulatory Visit: Payer: Self-pay

## 2020-03-22 ENCOUNTER — Telehealth: Payer: Self-pay

## 2020-03-22 ENCOUNTER — Ambulatory Visit (INDEPENDENT_AMBULATORY_CARE_PROVIDER_SITE_OTHER): Payer: PPO

## 2020-03-22 DIAGNOSIS — E119 Type 2 diabetes mellitus without complications: Secondary | ICD-10-CM | POA: Diagnosis not present

## 2020-03-22 DIAGNOSIS — I10 Essential (primary) hypertension: Secondary | ICD-10-CM | POA: Diagnosis not present

## 2020-03-22 NOTE — Patient Instructions (Addendum)
Dear Suzanne Strong,  Below is a summary of the goals we discussed during our follow up appointment on March 22, 2020. Please contact me anytime with questions or concerns.   Visit Information  Goals Addressed            This Visit's Progress   . Pharmacy Care Plan       CARE PLAN ENTRY  Current Barriers:  . Chronic Disease Management support, education, and care coordination needs related to diabetes, RLS  Pharmacist Clinical Goal(s):  [name] Over the next 6 months, patient will work with PharmD and primary care provider to address the following goals: o Diabetes: Maintain A1c < 7% and prevent hypoglycemia o Restless Leg Syndrome: Improve symptoms of RLS o Elevated BP: Check BP at home to ensure within goal < 140/90  Interventions: . Comprehensive medication review performed . Reviewed home BG monitoring - check daily and keep log  . Reviewed home BP monitoring - check 3-4 days per week and keep log . Consult Dr. Gabriel Carina to consider increasing Lyrica back to 150 mg BID  Patient Self Care Activities:  For the next 6 months until follow up visit:  . Continue to monitor fasting blood glucose daily and keep log . Complete Janumet paperwork and bring back to the office . Begin monitoring BP 3-4 days per week and keep log. Call if consistently above 140/90.  [name] Continue to inspect feet for cuts, scrapes, and infection daily . Work towards walking 10 minutes, 2-3 days per week  Updated goal documentation       The patient verbalized understanding of instructions, educational materials, and care plan provided today and declined offer to receive copy of patient instructions, educational materials, and care plan.   Telephone follow up appointment with pharmacy team member scheduled for: Jul 02, 2020 8:30 AM (phone)  [name], PharmD Clinical Pharmacist [facility] at [facility] [phone]   Hypoglycemia Hypoglycemia is when the sugar (glucose) level in  your blood is too low. Low blood sugar can happen to people who have diabetes and people who do not have diabetes. Low blood sugar can happen quickly, and it can be an emergency. What are the causes? This condition happens most often in people who have diabetes and may be caused by:  Diabetes medicine.  Not eating enough, or not eating often enough.  Doing more physical activity.  Drinking alcohol on an empty stomach. If you do not have diabetes, hypoglycemia may be caused by:  A tumor in the pancreas.  Not eating enough, or not eating for long periods at a time (fasting).  A very bad infection or illness.  Problems after having weight loss (bariatric) surgery.  Kidney failure or liver failure.  Certain medicines. What increases the risk? This condition is more likely to develop in people who:  Have diabetes and take medicines to lower their blood sugar.  Abuse alcohol.  Have a very bad illness. What are the signs or symptoms? Symptoms depend on whether your low blood sugar is mild, moderate, or very low. Mild  Hunger.  Feeling worried or nervous (anxious).  Sweating and feeling clammy.  Feeling dizzy or light-headed.  Being sleepy or having trouble sleeping.  Feeling like you may vomit (nauseous).  A fast heartbeat.  A headache.  Blurry vision.  Being irritable or grouchy.  Tingling or loss of feeling (numbness) around your mouth, lips, or tongue.  Trouble with moving (coordination). Moderate  Confusion and poor judgment.  Behavior changes.  Weakness.  Uneven heartbeats. Very low Very low blood sugar (severe hypoglycemia) is a medical emergency. It can cause:  Fainting.  Jerky movements that you cannot control (seizure).  Loss of consciousness (coma).  Death. How is this treated? Treating low blood sugar Low blood sugar is often treated by eating or drinking something sugary right away. The snack should contain 15 grams of a fast-acting  carb (carbohydrate). Options include:  4 oz (120 mL) of fruit juice.  4-6 oz (120-150 mL) of regular soda (not diet soda).  8 oz (240 mL) of low-fat milk.  Several pieces of hard candy. Check food labels to find out how many to eat for 15 grams.  1 Tbsp (15 mL) of sugar or honey. Treating low blood sugar if you have diabetes If you can think clearly and swallow safely, follow the 15:15 rule:  Take 15 grams of a fast-acting carb. Talk with your doctor about how much you should take.  Always keep a source of fast-acting carb with you, such as: ? Sugar tablets (glucose pills). Take 4 pills. ? Several pieces of hard candy. Check food labels to see how many pieces to eat for 15 grams. ? 4 oz (120 mL) of fruit juice. ? 4-6 oz (120-150 mL) of regular (not diet) soda. ? 1 Tbsp (15 mL) of honey or sugar.  Check your blood sugar 15 minutes after you take the carb.  If your blood sugar is still at or below 70 mg/dL (3.9 mmol/L), take 15 grams of a carb again.  If your blood sugar does not go above 70 mg/dL (3.9 mmol/L) after 3 tries, get help right away.  After your blood sugar goes back to normal, eat a meal or a snack within 1 hour.   Treating very low blood sugar If your blood sugar is at or below 54 mg/dL (3 mmol/L), you have very low blood sugar, or severe hypoglycemia. This is an emergency. Get medical help right away. If you have very low blood sugar and you cannot eat or drink, you will need to be given a hormone called glucagon. A family member or friend should learn how to check your blood sugar and how to give you glucagon. Ask your doctor if you need to have an emergency glucagon kit at home. Very low blood sugar may also need to be treated in a hospital. Follow these instructions at home: General instructions  Take over-the-counter and prescription medicines only as told by your doctor.  Stay aware of your blood sugar as told by your doctor.  If you drink alcohol: ? Limit  how much you use to:  0-1 drink a day for nonpregnant women.  0-2 drinks a day for men. ? Be aware of how much alcohol is in your drink. In the [place] one drink equals one 12 oz bottle of beer (355 mL), one 5 oz glass of wine (148 mL), or one 1 oz glass of hard liquor (44 mL).  Keep all follow-up visits as told by your doctor. This is important. If you have diabetes:  Always have a rapid-acting carb (15 grams) option with you to treat low blood sugar.  Follow your diabetes care plan as told by your doctor. Make sure you: ? Know the symptoms of low blood sugar. ? Check your blood sugar as often as told by your doctor. Always check it before and after exercise. ? Always check your blood sugar before you drive. ? Take your medicines  as told. ? Follow your meal plan. ? Eat on time. Do not skip meals.  Share your diabetes care plan with: ? Your work or school. ? People you live with.  Carry a card or wear jewelry that says you have diabetes.   Contact a doctor if:  You have trouble keeping your blood sugar in your target range.  You have low blood sugar often. Get help right away if:  You still have symptoms after you eat or drink something that contains 15 grams of fast-acting carb and you cannot get your blood sugar above 70 mg/dL by following the 15:15 rule.  Your blood sugar is at or below 54 mg/dL (3 mmol/L).  You have a seizure.  You faint. These symptoms may be an emergency. Do not wait to see if the symptoms will go away. Get medical help right away. Call your local emergency services (911 in the [place] Do not drive yourself to the hospital. Summary  Hypoglycemia happens when the level of sugar (glucose) in your blood is too low.  Low blood sugar can happen to people who have diabetes and people who do not have diabetes. Low blood sugar can happen quickly, and it can be an emergency.  Make sure you know the symptoms of low blood sugar and know how to treat  it.  Always keep a source of sugar (fast-acting carb) with you to treat low blood sugar. This information is not intended to replace advice given to you by your health care provider. Make sure you discuss any questions you have with your health care provider. Document Revised: 12/28/2018 Document Reviewed: 12/28/2018 Elsevier Patient Education  2021 [employer].

## 2020-03-22 NOTE — Chronic Care Management (AMB) (Addendum)
Chronic Care Management Pharmacy Assistant   Name: Suzanne Strong  MRN: [id number] DOB: 04-13-1945  Reason for Encounter: Medication Adjustment Request - Lyrica  PCP : [name], MD  Allergies:   Allergies  Allergen Reactions   Amoxicillin Swelling    REACTION: rash, swelling Has patient had a PCN reaction causing immediate rash, facial/tongue/throat swelling, SOB or lightheadedness with hypotension: yes Has patient had a PCN reaction causing severe rash involving mucus membranes or skin necrosis: no Has patient had a PCN reaction that required hospitalization: no Has patient had a PCN reaction occurring within the last 10 years: yes If all of the above answers are "NO", then may proceed with Cephalosporin use.    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   amLODipine (NORVASC) 5 MG tablet Take 1 tablet (5 mg total) by mouth daily.   atorvastatin (LIPITOR) 10 MG tablet TAKE 1 TABLET(10 MG) BY MOUTH DAILY   b complex vitamins tablet Take 1 tablet by mouth daily.   BELSOMRA 20 MG TABS TAKE ONE TABLET BY MOUTH AT bedtime   busPIRone (BUSPAR) 15 MG tablet Take 1 tablet (15 mg total) by mouth 2 (two) times daily.   Cholecalciferol (VITAMIN D-1000 MAX ST) 25 MCG (1000 UT) tablet Take 2,000 Units by mouth daily. Takes three 50 mcg tablets, 3 capsules daily   ferrous sulfate 325 (65 FE) MG tablet Take 325 mg by mouth daily with breakfast.   glipiZIDE (GLUCOTROL) 5  MG tablet Take 5 mg by mouth 2 (two) times daily.   glucose blood test strip To check blood glucose once daily and prn for diabetes   JANUMET XR 50-1000 MG TB24 Take 1 tablet by mouth 2 (two) times daily.    lidocaine-prilocaine (EMLA) cream Apply 1 application topically as needed. Apply to port when going through Chemo   Multiple Vitamins-Minerals (WOMENS MULTIVITAMIN PO) Take by mouth.   omeprazole (PRILOSEC) 20 MG capsule TAKE 1 CAPSULE(20 MG) BY MOUTH AT BEDTIME   pregabalin (LYRICA) 75 MG capsule Take 1 capsule (75 mg total) by mouth daily.   rOPINIRole (REQUIP) 1 MG tablet TAKE 1/2 TABLET BY MOUTH EVERY MORNING and TAKE THREE TABLETS BY MOUTH EVERYDAY AT BEDTIME   sertraline (ZOLOFT) 100 MG tablet TAKE TWO TABLETS BY MOUTH EVERY MORNING   traZODone (DESYREL) 100 MG tablet TAKE ONE TABLET BY MOUTH EVERYDAY AT BEDTIME   Facility-Administered Encounter Medications as of 03/22/2020  Medication   sodium chloride flush (NS) 0.9 % injection 10 mL    Current Diagnosis: Patient Active Problem List   Diagnosis Date Noted   Aspiration pneumonia ([place]) 02/15/2020   Diabetes mellitus without complication (HCC)    GERD (gastroesophageal reflux disease)    Hypertension    Elevated troponin    Urinary urgency 01/05/2020   Grief reaction 09/18/2019   Swallowing dysfunction 07/18/2019   Fever, intermittent 09/01/2018   Pre-syncope 02/07/2018  Orthostatic hypotension 01/26/2018   Episodic weakness 01/26/2018   Medicare annual wellness visit, subsequent 06/29/2017   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   Preoperative examination 04/27/2017   Carpal tunnel syndrome of right wrist 11/13/2016   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  02/17/2016   Obstructive sleep apnea 01/01/2016   Carcinoma of upper-outer quadrant of left breast in female, estrogen receptor negative ([place]) 11/21/2015   Hypomagnesemia 08/28/2015   Initial Medicare annual wellness visit 10/16/2014   Estrogen deficiency  10/16/2014   Colon cancer screening 10/16/2014   Controlled type 2 diabetes mellitus without complication ([place]) 76/16/0737   RLS (restless legs syndrome) 02/21/2014   Chronic neck pain 07/25/2010   Depression with anxiety 07/07/2010   Routine general medical examination at a health care facility 06/26/2010   B12 deficiency 09/04/2009   Anemia, iron deficiency 08/29/2009   Anxiety state 08/26/2009   GASTROPARESIS 08/26/2009   Vitamin D deficiency 07/19/2008   Disorder of bone and cartilage 07/19/2007   Hyperlipidemia 07/18/2007   EDEMA 07/18/2007   Skin lesion, superficial 06/02/2007    Contacted Dr. Joycie Peek office to inform her that Florita Nitsch is not doing well on 75 mg BID for restless leg syndrome, dose reduced by hospitalist. Patient would like to go back up to 150 mg twice daily if Dr. Gabriel Carina feels this is appropriate. Left message with nurse.  Follow-Up:  Care Coordination with Outside Provider and Pharmacist Review  [name], CPP notified  Margaretmary Dys, [place] Assistant [phone]  I have reviewed the care management and care coordination activities outlined in this encounter and I am certifying that I agree with the content of this note. No further action required.  [name], PharmD Clinical Pharmacist [facility] at [facility] [phone]

## 2020-03-22 NOTE — Chronic Care Management (AMB) (Signed)
Chronic Care Management Pharmacy  Name: Suzanne Strong  MRN: [id number] DOB: 1945-08-18  Chief Complaint/ HPI  Suzanne Strong,  75 y.o., female presents for their Follow-Up CCM visit with the clinical pharmacist via telephone.  PCP : [name], MD   Their chronic conditions include: hyperlipidemia, type 2 diabetes, chronic pain, restless leg syndrome, anxiety, depression, vitamin D deficiency, gastroparesis   Patient concerns:  Contacted patient to ensure she is not having any more hypoglycemia and review rule of 15  Office Visits:  03/05/20: [facility] - check BP at home, goal < 140/90  03/12/20: CCM call for medication adherence - Fasting BG ranging 75-100 this month after her glipizide was reduced from 10 gm BID to 5 mg per endocrinology on 02/12/20 due to hypoglycemia. She was to continue Janumet as prescribed. She reports difficulty swallowing Janumet this month. Would like to have a CCM visit to make sure pt is not having any further hypoglycemia and able to swallow tablets. Scheduled for Friday 03/22/20.   09/18/19: [facility] - Continue your requip 3 mg at night. Take 1/2 of the 1 mg pill at lunch time if needed   02/21/19: [facility] - cough, prednisone and cough syrup  Consult Visit:  02/15/20: ED Pneumonia - Lyrica reduced from 150 mg to 75 mg BID  02/12/20: Endocrinology - reduce glipizide to 5 mg BID   08/01/19: Endocrinology - Hgb A1c is now 6.9%, which is stable from prior. Her One Touch Ultra mini glucometer was reviewed. She checks a fasting sugar 4 days per week. Fasting sugars range 92 - 220 mg/dl over last 4 weeks. No hypoglycemia. Diabetes is controlled. 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 She is up to date on annual eye exams.  01/27/19: Endocrinology - diabetes controlled, continue low carb diet, check sugars daily, cont ACEI and statin  07/26/18: Endocrinology - discontinue gabapentin, replace with Lyrica 150 mg BID   Allergies   Allergen Reactions  . Amoxicillin Swelling    REACTION: rash, swelling Has patient had a PCN reaction causing immediate rash, facial/tongue/throat swelling, SOB or lightheadedness with hypotension: yes Has patient had a PCN reaction causing severe rash involving mucus membranes or skin necrosis: no Has patient had a PCN reaction that required hospitalization: no Has patient had a PCN reaction occurring within the last 10 years: yes If all of the above answers are "NO", then may proceed with Cephalosporin use.   [name] Fluconazole Rash    REACTION: hives  . Difuleron [Ferrous Fumarate-Dss]   . 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 ALPRAZolam (XANAX) 0.5 MG tablet TAKE ONE TABLET BY MOUTH twice daily AS NEEDED FOR ANXIETY OR SLEEP   . amitriptyline (ELAVIL) 50 MG tablet Take 1 tablet (50 mg total) by mouth at bedtime.   [name] amLODipine (NORVASC) 5 MG tablet Take 1 tablet (5 mg total) by mouth daily.   [name] atorvastatin (LIPITOR) 10 MG tablet TAKE 1 TABLET(10 MG) BY MOUTH DAILY   . BELSOMRA 20 MG TABS TAKE ONE TABLET BY MOUTH AT bedtime   . busPIRone (BUSPAR) 15 MG tablet Take 1 tablet (15 mg total) by mouth 2 (two) times daily.   . Cholecalciferol (VITAMIN D-1000 MAX ST) 25 MCG (1000 UT) tablet Take 2,000 Units by mouth daily. Takes three 50 mcg tablets, 3 capsules  daily   . ferrous sulfate 325 (65 FE) MG tablet Take 325 mg by mouth daily with breakfast.   . glipiZIDE (GLUCOTROL) 5 MG tablet Take 5 mg by mouth 2 (two) times daily.   [name] glucose blood test strip To check blood glucose once daily and prn for diabetes   . JANUMET XR 50-1000 MG TB24 Take 1 tablet by mouth 2 (two) times daily.    [name] lidocaine-prilocaine (EMLA) cream Apply 1 application topically as needed. Apply to port when going through Chemo   . Multiple Vitamins-Minerals (WOMENS  MULTIVITAMIN PO) Take by mouth.   [name] omeprazole (PRILOSEC) 20 MG capsule TAKE 1 CAPSULE(20 MG) BY MOUTH AT BEDTIME   . pregabalin (LYRICA) 75 MG capsule Take 1 capsule (75 mg total) by mouth daily.   [name] rOPINIRole (REQUIP) 1 MG tablet TAKE 1/2 TABLET BY MOUTH EVERY MORNING and TAKE THREE TABLETS BY MOUTH EVERYDAY AT BEDTIME   . sertraline (ZOLOFT) 100 MG tablet TAKE TWO TABLETS BY MOUTH EVERY MORNING   . traZODone (DESYREL) 100 MG tablet TAKE ONE TABLET BY MOUTH EVERYDAY AT BEDTIME   . [DISCONTINUED] Multiple Vitamin (MULTIVITAMIN) tablet Take 1 tablet by mouth daily.   [name] albuterol (VENTOLIN HFA) 108 (90 Base) MCG/ACT inhaler INHALE TWO PUFFS BY MOUTH EVERY SIX HOURS AS NEEDED   . b complex vitamins tablet Take 1 tablet by mouth daily.   . [DISCONTINUED] glipiZIDE (GLUCOTROL) 10 MG tablet Take 10 mg by mouth 2 (two) times daily before a meal.  02/15/2020: Patient takes 5 mg twice daily   Facility-Administered Encounter Medications as of 03/22/2020  Medication  . sodium chloride flush (NS) 0.9 % injection 10 mL   Current Diagnosis/Assessment: Goals    . Patient Stated     Starting 07/15/18, I will continue to take medications as prescribed.     . Pharmacy Care Plan     CARE PLAN ENTRY  Current Barriers:  . Chronic Disease Management support, education, and care coordination needs related to diabetes, RLS  Pharmacist Clinical Goal(s):  [name] Over the next 6 months, patient will work with PharmD and primary care provider to address the following goals: o Diabetes: Maintain A1c < 7% and prevent hypoglycemia o Restless Leg Syndrome: Improve symptoms of RLS o Elevated BP: Check BP at home to ensure within goal < 140/90  Interventions: . Comprehensive medication review performed . Reviewed home BG monitoring - check daily and keep log  . Reviewed home BP monitoring - check 3-4 days per week and keep log . Consult Dr. Gabriel Carina to consider increasing Lyrica back to 150 mg BID  Patient Self Care  Activities:  For the next 6 months until follow up visit:  . Continue to monitor fasting blood glucose daily and keep log . Complete Janumet paperwork and bring back to the office . Begin monitoring BP 3-4 days per week and keep log. Call if consistently above 140/90.  [name] Continue to inspect feet for cuts, scrapes, and infection daily . Work towards walking 10 minutes, 2-3 days per week  Updated goal documentation      Hypertension   CMP Latest Ref Rng & Units 02/17/2020 02/16/2020 02/14/2020  Glucose 70 - 99 mg/dL 148(H) 193(H) 133(H)  BUN 8 - 23 mg/dL 14 20 23   Creatinine 0.44 - 1.00 mg/dL 0.80 0.95 1.16(H)  Sodium 135 - 145 mmol/L 139 137 136  Potassium 3.5 - 5.1 mmol/L 3.9 3.6 4.2  Chloride 98 - 111 mmol/L 100 98 101  CO2  22 - 32 mmol/L 27 29 24   Calcium 8.9 - 10.3 mg/dL 9.1 8.8(L) 9.7  Total Protein 6.5 - 8.1 g/dL - - -  Total Bilirubin 0.3 - 1.2 mg/dL - - -  Alkaline Phos 38 - 126 U/L - - -  AST 15 - 41 U/L - - -  ALT 0 - 44 U/L - - -   Office blood pressures are: BP Readings from Last 3 Encounters:  02/17/20 (!) 173/83  01/05/20 128/70  11/15/19 112/70   Goal < 140/90 mmHg Patient is currently uncontrolled on the following medications:   No pharamcotherapy  03/22/20: Isolated elevated BP at last visit - PCP asked patient to start home monitoring. Pt purchased a home monitor but has not started checking. She will begin checking and keep a log for monthly adherence calls through [facility].  Plan: Begin monitoring BP 3-4 days per week and keep a log. Call if BP consistently above 140/90 mmHg.  Hyperlipidemia  Lipid Panel     Component Value Date/Time   CHOL 121 02/16/2020 0457   TRIG 78 02/16/2020 0457   HDL 48 02/16/2020 0457   CHOLHDL 2.5 02/16/2020 0457   VLDL 16 02/16/2020 0457   LDLCALC 57 02/16/2020 0457   LDLDIRECT 97.0 06/29/2017 0902    The ASCVD Risk score (Goff DC Jr., et al., 2013) failed to calculate for the following reasons:   The valid total  cholesterol range is 130 to 320 mg/dL   LDL goal < 100 Patient has failed these meds in past: none Patient is currently controlled on the following medications:   Atorvastatin 10 mg - 1 tablet daily (AM)  Update 03/22/20: LDL within goal on current therapy.   Plan: Continue current medications  Diabetes   Lab Results  Component Value Date   CREATININE 0.80 02/17/2020   BUN 14 02/17/2020   GFR 65.12 07/15/2018   GFRNONAA >60 02/17/2020   GFRAA >60 10/20/2019   NA 139 02/17/2020   K 3.9 02/17/2020   CALCIUM 9.1 02/17/2020   CO2 27 02/17/2020    Recent Relevant Labs: Lab Results  Component Value Date/Time   HGBA1C 6.1 (H) 02/15/2020 09:27 AM   HGBA1C 8.0 (H) 06/29/2017 09:02 AM   HGBA1C 7.3 12/09/2016 12:00 AM    A1c goal < 7% Patient has failed these meds in past: Jardiance (yeast infections) Patient is currently controlled on the following medications:   Glipizide 5 mg - 1 tablet BID (breakfast and dinner)  Janumet XR 50-1000 mg - 1 tablet BID (breakfast and dinner)  Last diabetic eye exam:  Lab Results  Component Value Date/Time   HMDIABEYEEXA No Retinopathy 06/19/2019 12:00 AM    Last diabetic foot exam: Dr. Gabriel Carina evaluates at each visit; pt checks her feet daily   Update 03/22/20:  Dr. Gabriel Carina reduced glipizide to 5 mg BID Dec 2021 due to hypoglycemia. Pt is checking BG daily. Denies any hypoglycemia this week.  Reports fasting BG from memory: 80, 110, 124. She will begin keeping a log. Previously reported some lows early January but this seems to be improving. Patient did not receive assistance forms for Janumet, she would like [place] to email these.  Plan: Continue current medications; Email Janumet patient assistance forms.  Anxiety/Depression   Followed by Thayer Headings, NP Patient has failed these meds in past: none Patient is currently controlled on the following medications:   Sertraline 100 mg - 2 tablets daily (AM)  Alprazolam 0.5 mg - 1 tablet BID PRN  anxiety/sleep  Trazodone 100 mg -  1 tablet qhs  Belsomra 20 mg - 1 tablet qhs (started 11/15/19)  Update 01/02/20: Reports overall doing well, she is taking things one day at a time. Still grieving her husband's passing 03/2019.  Reports she is still taking all of the above medications. Started Belsomra 10/2019 and reports she falls asleep quickly and able to stay asleep longer. Reports sleeping 6-7 hours most nights and is satisfied, well rested.   Update 03/22/20: Reports sleep has worsened recently. Takes Xanax 1-2 tablet once daily at bedtime but does not notice improvement in sleep with this. Takes the Belsomra every night. Trazodone 1 tablet every night. Next appt with psychiatry is in March. We discussed RLS could be impacting sleep, will try to adjust therapy to better control her RLS.   Plan: Continue current medications Encouraged patient to call and see if her psych appointment can be moved up since she is having difficulty sleeping.   Chronic Pain/Neuropathy   Patient has failed these meds in past: gabapentin, Cymbalta Patient is currently controlled on the following medications:   Pregabalin 75 mg - 1 capsule BID  Amitriptyline 50 mg - 1 tablet qhs  Alpha lipoic acid 200 mg OTC - 1 tablet qhs  Meloxicam 7.5 mg - 1 tablet daily  Update 03/22/20: RLS and neuropathy pain overlap for patient. Pain has been worse lately with lower dose Lyrica.    Plan: Continue current medications  RLS   B12 - 09/18/19 555  CBC Latest Ref Rng & Units 02/17/2020 02/16/2020 02/14/2020  WBC 4.0 - 10.5 K/uL 5.7 6.9 11.7(H)  Hemoglobin 12.0 - 15.0 g/dL 10.5(L) 9.2(L) 10.8(L)  Hematocrit 36.0 - 46.0 % 32.9(L) 29.8(L) 33.8(L)  Platelets 150 - 400 K/uL 231 171 209   Iron/TIBC/Ferritin/ %Sat    Component Value Date/Time   IRON 50 08/29/2009 0813   FERRITIN 46.7 07/15/2018 0927   IRONPCTSAT 11.6 (L) 08/29/2009 0813   Patient has failed these meds in past: none Patient is currently uncontrolled on  the following medications:   Ropinirole 1 mg - 1/2 tablet every morning and 3 tablets at bedtime   Lyrica 75 mg - 1 BID  Seen by PCP 09/2019, increased ropinirole by 1/2 tablet and evaluated for anemia, B12. Anemia stable and B12 normal.   Update 03/22/20: Pt reports symptoms have worsened since Lyrica dose decrease 01/2020 and impacting sleep. She is near max dose ropinirole 4mg /day. Iron levels WNL, B12 WNL. Continues iron supplement and b-complex daily. Vit D WNL.   Plan: Continue current medications; Consult Dr. Gabriel Carina - see if Lyrica can be increased back to previous dose 150 mg BID.   Gastroparesis   Patient has failed these meds in past: none Patient is currently controlled on the following medications:   Omeprazole 20 mg - 1 tablet daily at bedtime  Update 03/22/20: Continues daily without concerns  Plan: Continue current medications  Medication Management  OTCs: B-complex, multivitamin, vitamin D3 50 mcg - 3 capsules daily, Vitafusion energy and bone support  Pharmacy/Benefits:  [facility]  Adherence: adherence packaging from [facility], refills timely   Affordability: Janumet, Belsomra (cost high, but able to afford)  CCM Follow Up: 3 months  [name], PharmD Clinical Pharmacist [facility] at [facility] [phone]

## 2020-03-24 NOTE — Progress Notes (Signed)
I have personally reviewed this encounter including the documentation in this note and have collaborated with the care management provider regarding care management and care coordination activities to include development and update of the comprehensive care plan. I am certifying that I agree with the content of this note and encounter as supervising physician. ? ?Sharbel Sahagun MD  ?

## 2020-03-25 ENCOUNTER — Telehealth: Payer: Self-pay

## 2020-03-25 NOTE — Telephone Encounter (Signed)
In IN box to fax Thanks

## 2020-03-25 NOTE — Telephone Encounter (Signed)
[place],   The application for Janumet is in Dr. Marliss Coots box in the main office. Would you mind having her sign off and faxing the provider portion (page 2 only) to [employer] patient assistance program?  Thanks!  [name], PharmD Clinical Pharmacist [facility] at [facility] [phone]

## 2020-03-25 NOTE — Chronic Care Management (AMB) (Addendum)
Chronic Care Management Pharmacy Assistant   Name: NATALIN BIBLE  MRN: [id number] DOB: 1945/10/02  Reason for Encounter: Patient Assistance Application - Janumet  PCP : [name], MD  Allergies:   Allergies  Allergen Reactions   Amoxicillin Swelling    REACTION: rash, swelling Has patient had a PCN reaction causing immediate rash, facial/tongue/throat swelling, SOB or lightheadedness with hypotension: yes Has patient had a PCN reaction causing severe rash involving mucus membranes or skin necrosis: no Has patient had a PCN reaction that required hospitalization: no Has patient had a PCN reaction occurring within the last 10 years: yes If all of the above answers are "NO", then may proceed with Cephalosporin use.    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   amLODipine (NORVASC) 5 MG tablet Take 1 tablet (5 mg total) by mouth daily.   atorvastatin (LIPITOR) 10 MG tablet TAKE 1 TABLET(10 MG) BY MOUTH DAILY   b complex vitamins tablet Take 1 tablet by mouth daily.   BELSOMRA 20 MG TABS TAKE ONE TABLET BY MOUTH AT bedtime   busPIRone (BUSPAR) 15 MG tablet Take 1 tablet (15 mg total) by mouth 2 (two) times daily.   Cholecalciferol (VITAMIN D-1000 MAX ST) 25 MCG (1000 UT) tablet Take 2,000 Units by mouth daily. Takes three 50 mcg tablets, 3 capsules daily   ferrous sulfate 325 (65 FE) MG tablet Take 325 mg by mouth daily with breakfast.   glipiZIDE (GLUCOTROL)  5 MG tablet Take 5 mg by mouth 2 (two) times daily.   glucose blood test strip To check blood glucose once daily and prn for diabetes   JANUMET XR 50-1000 MG TB24 Take 1 tablet by mouth 2 (two) times daily.    lidocaine-prilocaine (EMLA) cream Apply 1 application topically as needed. Apply to port when going through Chemo   Multiple Vitamins-Minerals (WOMENS MULTIVITAMIN PO) Take by mouth.   omeprazole (PRILOSEC) 20 MG capsule TAKE 1 CAPSULE(20 MG) BY MOUTH AT BEDTIME   pregabalin (LYRICA) 75 MG capsule Take 1 capsule (75 mg total) by mouth daily.   rOPINIRole (REQUIP) 1 MG tablet TAKE 1/2 TABLET BY MOUTH EVERY MORNING and TAKE THREE TABLETS BY MOUTH EVERYDAY AT BEDTIME   sertraline (ZOLOFT) 100 MG tablet TAKE TWO TABLETS BY MOUTH EVERY MORNING   traZODone (DESYREL) 100 MG tablet TAKE ONE TABLET BY MOUTH EVERYDAY AT BEDTIME   Facility-Administered Encounter Medications as of 03/25/2020  Medication   sodium chloride flush (NS) 0.9 % injection 10 mL    Current Diagnosis: Patient Active Problem List   Diagnosis Date Noted   Aspiration pneumonia ([employer]) 02/15/2020   Diabetes mellitus without complication (HCC)    GERD (gastroesophageal reflux disease)    Hypertension    Elevated troponin    Urinary urgency 01/05/2020   Grief reaction 09/18/2019   Swallowing dysfunction 07/18/2019   Fever, intermittent 09/01/2018   Pre-syncope 02/07/2018  Orthostatic hypotension 01/26/2018   Episodic weakness 01/26/2018   Medicare annual wellness visit, subsequent 06/29/2017   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   Preoperative examination 04/27/2017   Carpal tunnel syndrome of right wrist 11/13/2016   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  02/17/2016   Obstructive sleep apnea 01/01/2016   Carcinoma of upper-outer quadrant of left breast in female, estrogen receptor negative ([place]) 11/21/2015   Hypomagnesemia 08/28/2015   Initial Medicare annual wellness visit 10/16/2014   Estrogen deficiency  10/16/2014   Colon cancer screening 10/16/2014   Controlled type 2 diabetes mellitus without complication ([place]) 92/42/6834   RLS (restless legs syndrome) 02/21/2014   Chronic neck pain 07/25/2010   Depression with anxiety 07/07/2010   Routine general medical examination at a health care facility 06/26/2010   B12 deficiency 09/04/2009   Anemia, iron deficiency 08/29/2009   Anxiety state 08/26/2009   GASTROPARESIS 08/26/2009   Vitamin D deficiency 07/19/2008   Disorder of bone and cartilage 07/19/2007   Hyperlipidemia 07/18/2007   EDEMA 07/18/2007   Skin lesion, superficial 06/02/2007   Per patients request Patient Assistance Application has been filled out and emailed to patient for her to complete her part. Will follow up in March to see if approved.  Sharyn Lull provided application to front desk and it was placed in Dr. Marliss Coots box for provider signature/fax.  Follow-Up:  Patient Assistance Coordination and Pharmacist Review  [name], CPP notified  Margaretmary Dys, [place] Pharmacy Assistant [phone]

## 2020-03-25 NOTE — Telephone Encounter (Signed)
See prev comment, please review page 2 and sign, I will fax it back

## 2020-03-26 NOTE — Telephone Encounter (Addendum)
Called [employer]'s assistance program to get fax # and they said that Janumet XR form can't be faxed back you have to mail it. Form Mailed, and copy sent for scanning  FYI to pharmacist

## 2020-03-29 ENCOUNTER — Other Ambulatory Visit: Payer: Self-pay | Admitting: General Surgery

## 2020-03-29 DIAGNOSIS — C50412 Malignant neoplasm of upper-outer quadrant of left female breast: Secondary | ICD-10-CM

## 2020-03-29 DIAGNOSIS — Z171 Estrogen receptor negative status [ER-]: Secondary | ICD-10-CM

## 2020-04-11 ENCOUNTER — Telehealth: Payer: Self-pay

## 2020-04-11 NOTE — Chronic Care Management (AMB) (Addendum)
Chronic Care Management Pharmacy Assistant   Name: Suzanne Strong  MRN: [id number] DOB: 1945/10/30  Reason for Encounter: Medication Review- Medication Adherence and Delivery Coordination  Patient Question:  1.  Have you seen any other providers since your last visit? No  PCP : Tower, [name], MD  Allergies:   Allergies  Allergen Reactions   Amoxicillin Swelling    REACTION: rash, swelling Has patient had a PCN reaction causing immediate rash, facial/tongue/throat swelling, SOB or lightheadedness with hypotension: yes Has patient had a PCN reaction causing severe rash involving mucus membranes or skin necrosis: no Has patient had a PCN reaction that required hospitalization: no Has patient had a PCN reaction occurring within the last 10 years: yes If all of the above answers are "NO", then may proceed with Cephalosporin use.    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   amLODipine (NORVASC) 5 MG tablet Take 1 tablet (5 mg total) by mouth daily.   atorvastatin (LIPITOR) 10 MG tablet TAKE 1 TABLET(10 MG) BY MOUTH DAILY   b complex vitamins tablet Take 1 tablet by mouth daily.   BELSOMRA 20 MG TABS TAKE ONE TABLET BY MOUTH AT bedtime   busPIRone (BUSPAR) 15 MG tablet Take 1 tablet (15 mg total) by mouth 2 (two) times daily.   Cholecalciferol (VITAMIN D-1000 MAX ST) 25 MCG (1000 UT) tablet Take 2,000 Units by mouth daily. Takes three 50 mcg tablets, 3 capsules  daily   ferrous sulfate 325 (65 FE) MG tablet Take 325 mg by mouth daily with breakfast.   glipiZIDE (GLUCOTROL) 5 MG tablet Take 5 mg by mouth 2 (two) times daily.   glucose blood test strip To check blood glucose once daily and prn for diabetes   JANUMET XR 50-1000 MG TB24 Take 1 tablet by mouth 2 (two) times daily.    lidocaine-prilocaine (EMLA) cream Apply 1 application topically as needed. Apply to port when going through Chemo   Multiple Vitamins-Minerals (WOMENS MULTIVITAMIN PO) Take by mouth.   omeprazole (PRILOSEC) 20 MG capsule TAKE 1 CAPSULE(20 MG) BY MOUTH AT BEDTIME   pregabalin (LYRICA) 75 MG capsule Take 1 capsule (75 mg total) by mouth daily.   rOPINIRole (REQUIP) 1 MG tablet TAKE 1/2 TABLET BY MOUTH EVERY MORNING and TAKE THREE TABLETS BY MOUTH EVERYDAY AT BEDTIME   sertraline (ZOLOFT) 100 MG tablet TAKE TWO TABLETS BY MOUTH EVERY MORNING   traZODone (DESYREL) 100 MG tablet TAKE ONE TABLET BY MOUTH EVERYDAY AT BEDTIME   Facility-Administered Encounter Medications as of 04/11/2020  Medication   sodium chloride flush (NS) 0.9 % injection 10 mL    Current Diagnosis: Patient Active Problem List   Diagnosis Date Noted   Aspiration pneumonia ([place]) 02/15/2020   Diabetes mellitus without complication (HCC)    GERD (gastroesophageal reflux disease)    Hypertension    Elevated troponin    Urinary urgency 01/05/2020   Grief  reaction 09/18/2019   Swallowing dysfunction 07/18/2019   Fever, intermittent 09/01/2018   Pre-syncope 02/07/2018   Orthostatic hypotension 01/26/2018   Episodic weakness 01/26/2018   Medicare annual wellness visit, subsequent 06/29/2017   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   Preoperative examination 04/27/2017   Carpal tunnel syndrome of right wrist 11/13/2016   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 of  02/17/2016   Obstructive sleep apnea 01/01/2016   Carcinoma of upper-outer quadrant of left breast in female, estrogen receptor negative ([place])  11/21/2015   Hypomagnesemia 08/28/2015   Initial Medicare annual wellness visit 10/16/2014   Estrogen deficiency 10/16/2014   Colon cancer screening 10/16/2014   Controlled type 2 diabetes mellitus without complication ([place]) 61/44/3154   RLS (restless legs syndrome) 02/21/2014   Chronic neck pain 07/25/2010   Depression with anxiety 07/07/2010   Routine general medical examination at a health care facility 06/26/2010   B12 deficiency 09/04/2009   Anemia, iron deficiency 08/29/2009   Anxiety state 08/26/2009   GASTROPARESIS 08/26/2009   Vitamin D deficiency 07/19/2008   Disorder of bone and cartilage 07/19/2007   Hyperlipidemia 07/18/2007   EDEMA 07/18/2007   Skin lesion, superficial 06/02/2007    Reviewed chart for medication changes ahead of medication coordination call.  Identified patient has been off amlodipine for about 2 weeks, unknowingly.  No OVs, Consults, or hospital visits since last care coordination call / Pharmacist visit. No medication changes indicated  BP Readings from Last 3 Encounters:  02/17/20 (!) 173/83  01/05/20 128/70  11/15/19 112/70    Lab Results  Component Value Date   HGBA1C 6.1 (H) 02/15/2020    Patient obtains medications through Adherence Packaging  30 Days   Last adherence delivery date:  03/20/20, 30 DS Alpha lipoic acid 200 mg - 1 tablet daily (bedtime) Amitriptyline 50 mg - 1 tablet daily (bedtime) Atorvastatin 10 mg- 1 tablet daily (breakfast) Belsomra 20 mg tabs-1 tab daily at bedtime (bedtime) Buspirone 15 mg- 1 tablet twice daily (breakfast and bedtime)  Janumet XR 50/1000 mg - 1 tablet two times a day (breakfast and evening meal)  Omeprazole 20 mg - 1 tablet daily (bedtime) Ropinirole 1 mg -  tablet once daily (breakfast) and 3 tablets once nightly (bedtime) Sertraline 100 mg - 2 tablets daily (breakfast) Trazodone 100 mg - 1 tablet daily (bedtime) VIALS, 30 DS: Alprazolam 0.5 mg - 1 tablet daily as needed Pregabalin 75 mg -  1 BID   Patient declined the following medications last delivery: Albuterol inhaler -Inhale 2 puffs every 6 hours as needed- Only uses PRN has 1 left Centrum Silver Multivitamin - 1 tablet daily (breakfast)- OTC patient buys Glipizide 5 mg BID - Dr. Gabriel Carina reduced dose to 5mg  BID so she has been cutting her 10mg  tablets in half. She states she has over 30 tablets left      on 03/12/20 OneTouch ultra test strips - use to check blood sugar once daily- states she still has one unopened box and half box of another Pregabalin 150 mg - 1 twice daily (breakfast and bedtime) - dose reduced to 75 mg  Vitamin B-Complex - 1 tablet daily ( breakfast)- Pt buys OTC Vitamin D3 50 mcg - 1 tablet twice daily (breakfast and bedtime) Pt buys OTC   Patient is due for next adherence delivery on: 04/18/20, 30 DS  Contacted patient and reviewed medications and coordinated delivery.  This delivery to include: Adherence Packaging  30 Days  Alpha lipoic acid 200 mg - 1 tablet daily (  bedtime) Amitriptyline 50 mg - 1 tablet daily (bedtime) Amlodipine 5 mg- 1 tablet daily (breakfast) - lapse in adherence, restarting 3/3 Atorvastatin 10 mg- 1 tablet daily (breakfast) Belsomra 20 mg tabs-1 tab daily at bedtime (bedtime) Buspirone 15 mg- 1 tablet twice daily (breakfast and bedtime)  Glipizide 5 mg BID 1 tablet twice daily (breakfast and evening meal)  Janumet XR 50/1000 mg - 1 tablet two times a day (breakfast and evening meal)  Omeprazole 20 mg - 1 tablet daily (bedtime) Ropinirole 1 mg -  tablet once daily (breakfast) and 3 tablets once nightly (bedtime) Sertraline 100 mg - 2 tablets daily (breakfast) Trazodone 100 mg - 1 tablet daily (bedtime)  PRN/VIAL medications: Alprazolam 0.5 mg- Take 1 tablet twice daily PRN Pregabalin 150 mg - 1 twice daily (breakfast and bedtime) - dose increased per Dr. Gabriel Carina   Patient declined the following medications this month: Albuterol inhaler -Inhale 2 puffs every 6 hours as  needed- Only uses PRN has 1 left as of 04/15/20 OneTouch ultra test strips - use to check blood sugar once daily- states she still has one unopened box   Patient need refills on the following medications: Belsomra- [facility]- requested 04/15/20  Confirmed delivery date of 04/18/20, advised patient that pharmacy will contact her the morning of delivery.  Blood pressure and blood sugar readings are as follows:  Date:  Fasting BG BP    Pulse 04/08/20 96  154/86      85 04/09/20 91  139/95      81 04/12/20 86  175/87      79 04/13/20 102  127/52      85 04/14/20 95  150/83      87 04/15/20 105  158/82      76  Lowest - 78 on 04/02/20 Highest - 186 on 04/12/20 Most have been 80-100  She has not heard anything regarding her Janumet PAP. Will contact PAP to get an update.  Follow-Up:  Pharmacist Review  [name], CPP notified  Margaretmary Dys, [place] Pharmacy Assistant [phone]  Reviewed content of note. Pt has been off amlodipine for unclear reason. Called to discuss. She never refilled bc she was not sure is she should continue it or not. Reports it was prescribed at the hospital. Due to elevated BP, suggested she resume this medication. Will put in her packaging. Pt confirms she has been taking higher dose pregabalin 150 mg BID and it is working much better for her than 75 mg for neuropathy. Inquired about any hypoglycemia. She reports none since she started logging her readings. The lowest was 78 fasting on 04/02/20. Highest 186 fasting on 04/12/20. She reports Belsomra is too expensive, $100 per month. She plans to discuss alternatives at follow up with psych - Next follow up is 05/03/20.   [name], PharmD Clinical Pharmacist [name] [phone]

## 2020-04-15 ENCOUNTER — Telehealth: Payer: Self-pay | Admitting: Psychiatry

## 2020-04-15 NOTE — Chronic Care Management (AMB) (Addendum)
Contacted [employer] to get status update on patients Janumet XR application. They state that the provider portion and patient portion must be mailed in together. They do not have a record of the patient at the time due to this.    Follow-Up:  Patient Assistance Coordination and Pharmacist Review  [name], CPP notified  Margaretmary Dys, [place] Pharmacy Assistant [phone]

## 2020-04-15 NOTE — Telephone Encounter (Signed)
Ria Comment, please ask Malene to bring her application (page 1) to the office, so we can re-mail the entire application. She should have this in her email account. Please apologize for the trouble on my behalf.  Thanks,  [name], PharmD Clinical Pharmacist [facility] [phone]

## 2020-04-15 NOTE — Chronic Care Management (AMB) (Addendum)
Contacted patient regarding her patient assistance application. She states that she has not yet mailed her portion off. She will bring this into the office to leave for [name] as soon as she gets it completed.    Follow-Up:  Patient Assistance Coordination and Pharmacist Review  [name], CPP notified  Margaretmary Dys, [place] Pharmacy Assistant [phone]

## 2020-04-16 ENCOUNTER — Telehealth: Payer: Self-pay | Admitting: Psychiatry

## 2020-04-16 DIAGNOSIS — G4700 Insomnia, unspecified: Secondary | ICD-10-CM

## 2020-04-16 MED ORDER — BELSOMRA 20 MG PO TABS
1.0000 | ORAL_TABLET | Freq: Every day | ORAL | 0 refills | Status: DC
Start: 2020-04-16 — End: 2020-05-03

## 2020-04-16 NOTE — Telephone Encounter (Signed)
Sent!

## 2020-04-16 NOTE — Addendum Note (Signed)
Addended by: Sharyl Nimrod on: 04/16/2020 11:39 AM   Modules accepted: Orders

## 2020-04-16 NOTE — Telephone Encounter (Signed)
Next visit is 05/03/20. Requesting refill on Belsomva called to [facility], phone # is [phone].

## 2020-04-17 ENCOUNTER — Telehealth: Payer: Self-pay | Admitting: Gastroenterology

## 2020-04-17 ENCOUNTER — Telehealth (INDEPENDENT_AMBULATORY_CARE_PROVIDER_SITE_OTHER): Payer: PPO | Admitting: Gastroenterology

## 2020-04-17 DIAGNOSIS — R1310 Dysphagia, unspecified: Secondary | ICD-10-CM

## 2020-04-17 DIAGNOSIS — C760 Malignant neoplasm of head, face and neck: Secondary | ICD-10-CM | POA: Diagnosis not present

## 2020-04-17 MED ORDER — NA SULFATE-K SULFATE-MG SULF 17.5-3.13-1.6 GM/177ML PO SOLN: ORAL | 0 refills | Status: DC

## 2020-04-17 NOTE — Telephone Encounter (Signed)
Patient called to ask that we call her home number when calling to schedule [phone]

## 2020-04-17 NOTE — Telephone Encounter (Signed)
Error

## 2020-04-18 NOTE — Progress Notes (Signed)
Suzanne Strong [address]  [place], [place] [address]  Main: [phone]  Fax: [phone]   Gastroenterology Consultation  Referring Provider:     [name], MD Primary Care Physician:  Tower, [name], MD Reason for Consultation:     Dysphagia        HPI:   Virtual Visit via Video Note  I connected with patient on 04/18/20 at  3:00 PM EST by video and verified that I am speaking with the correct person using two identifiers.   I discussed the limitations, risks, security and privacy concerns of performing an evaluation and management service by video and the availability of in person appointments. I also discussed with the patient that there may be a patient responsible charge related to this service. The patient expressed understanding and agreed to proceed.  Location of the patient: Home Location of provider: Home Participating persons: Patient and provider only (Nursing staff checked in patient via phone but were not physically involved in the video interaction - see their notes)   History of Present Illness: No chief complaint on file.   Suzanne Strong is a 75 y.o. y/o female referred for consultation & management  by Dr. Glori Bickers, [name], MD.  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  Has not had any recent upper endoscopy.  Upper endoscopy in 2011 with gastric polyps seen otherwise normal.  Patient has undergone speech and swallow evaluation with further GI evaluation recommended.  Patient was recently evaluated in the hospital by Dr. Haig Prophet and an EGD was recommended as an inpatient or outpatient.  Patient was requiring supplemental oxygen as an inpatient but is not at this time  She had a Cologuard in 2018 and none since then.  Cologuard at that time was negative  Past Medical History:  Diagnosis Date  . Anemia     iron deficiency and B12 deficiency  . Anxiety   . Breast cancer ([place]) 02/25/2015   upper-outer quadrant of left female breast, Triple Negative. Neo-adjuvant chemo with complete pathologic response.   . Cervical dysplasia    conization  . Diabetes mellitus without complication ([place])   . Diverticulosis   . Fever 04/11/2015  . Gastroparesis    related to previous radiation tx  . GERD (gastroesophageal reflux disease)   . Hyperlipidemia   . Hypertension   . Neuropathy    legs/feet, S/P chemo meds  . Personal history of chemotherapy   . Personal history of radiation therapy 2017   LEFT lumpectomy w/ radiation  . RLS (restless legs syndrome)   . Shingles    Hx of  . 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 Wears dentures    partial bottom    Past Surgical History:  Procedure Laterality Date  . APPENDECTOMY    . BREAST BIOPSY Left 02/25/2015   INVASIVE MAMMARY CARCINOMA,Triple negative.  [name] BREAST CYST ASPIRATION    . BREAST EXCISIONAL BIOPSY Left 12/2015   surgery  . BREAST LUMPECTOMY WITH NEEDLE LOCALIZATION Left 10/02/2015   Procedure: BREAST LUMPECTOMY WITH NEEDLE LOCALIZATION;  Surgeon: [name], MD;  Location: [facility];  Service: General;  Laterality: Left;  . BREAST LUMPECTOMY WITH SENTINEL LYMPH NODE BIOPSY Left 10/02/2015   Procedure: LEFT BREAST WIDE EXCISION WITH SENTINEL LYMPH NODE BX;  Surgeon: [name], MD;  Location: [facility];  Service: General;  Laterality: Left;  .  BREAST SURGERY     breast biopsy benign  . CARPAL TUNNEL RELEASE Right 05/19/2017   Procedure: CARPAL TUNNEL RELEASE;  Surgeon: Earnestine Leys, MD;  Location: [facility];  Service: Orthopedics;  Laterality: Right;  . CATARACT EXTRACTION W/PHACO Right 10/26/2018   Procedure: CATARACT EXTRACTION PHACO AND INTRAOCULAR LENS PLACEMENT ([place]) right diabetic;  Surgeon: Leandrew Koyanagi, MD;  Location: [place];  Service: Ophthalmology;  Laterality: Right;  Diabetic - oral meds cancer center been  notified and will come  . CATARACT EXTRACTION W/PHACO Left 11/16/2018   Procedure: CATARACT EXTRACTION PHACO AND INTRAOCULAR LENS PLACEMENT ([facility]) LEFT DIABETIC  01:01.0  17.0%  10.37;  Surgeon: Leandrew Koyanagi, MD;  Location: [place];  Service: Ophthalmology;  Laterality: Left;  Diabetic - oral meds Port-a-cath  . CHOLECYSTECTOMY    . Cologuard  07/22/2016   Negative  . ESOPHAGOGASTRODUODENOSCOPY  07/2009   normal with few gastric polyps  . MOLE REMOVAL    . PORT-A-CATH REMOVAL    . PORTACATH PLACEMENT    . PORTACATH PLACEMENT Right 03/12/2015   Procedure: INSERTION PORT-A-CATH;  Surgeon: [name], MD;  Location: [facility];  Service: General;  Laterality: Right;  . RADICAL NECK DISSECTION    . TONSILLECTOMY     cancer treated with chemo and radiation  . TRIGGER FINGER RELEASE Right 05/19/2017   Procedure: RELEASE TRIGGER FINGER/A-1 PULLEY ring finger;  Surgeon: Earnestine Leys, MD;  Location: [facility];  Service: Orthopedics;  Laterality: Right;    Prior to Admission medications   Medication Sig Start Date End Date Taking? Authorizing Provider  albuterol (VENTOLIN HFA) 108 (90 Base) MCG/ACT inhaler INHALE TWO PUFFS BY MOUTH EVERY SIX HOURS AS NEEDED 01/09/20   Tower, [name], MD  ALPRAZolam Duanne Moron) 0.5 MG tablet TAKE ONE TABLET BY MOUTH twice daily AS NEEDED FOR ANXIETY OR SLEEP 03/06/20   Thayer Headings, PMHNP  amitriptyline (ELAVIL) 50 MG tablet Take 1 tablet (50 mg total) by mouth at bedtime. 12/07/16   Cammie Sickle, MD  amLODipine (NORVASC) 5 MG tablet Take 1 tablet (5 mg total) by mouth daily. 02/18/20 04/18/20  Shelly Coss, MD  atorvastatin (LIPITOR) 10 MG tablet TAKE 1 TABLET(10 MG) BY MOUTH DAILY 06/02/19   Tower, [name], MD  b complex vitamins tablet Take 1 tablet by mouth daily.    [provider]  busPIRone (BUSPAR) 15 MG tablet Take 1 tablet (15 mg total) by mouth 2 (two) times daily. 02/07/20 03/08/20  Thayer Headings, PMHNP   Cholecalciferol (VITAMIN D-1000 MAX ST) 25 MCG (1000 UT) tablet Take 2,000 Units by mouth daily. Takes three 50 mcg tablets, 3 capsules daily    [provider]  ferrous sulfate 325 (65 FE) MG tablet Take 325 mg by mouth daily with breakfast.    [provider]  glipiZIDE (GLUCOTROL) 5 MG tablet Take 5 mg by mouth 2 (two) times daily. 03/22/20   [provider]  glucose blood test strip To check blood glucose once daily and prn for diabetes 10/20/19   Tower, [name], MD  JANUMET XR 50-1000 MG TB24 Take 1 tablet by mouth 2 (two) times daily.  03/13/15   [provider]  lidocaine-prilocaine (EMLA) cream Apply 1 application topically as needed. Apply to port when going through Chemo    [provider]  Multiple Vitamins-Minerals (WOMENS MULTIVITAMIN PO) Take by mouth.    [provider]  Na Sulfate-K Sulfate-Mg Sulf 17.5-3.13-1.6 GM/177ML SOLN At 5 PM the day before  procedure take 1 bottle and 5 hours before procedure take 1 bottle. 04/17/20   Virgel Manifold, MD  omeprazole (PRILOSEC) 20 MG capsule TAKE 1 CAPSULE(20 MG) BY MOUTH AT BEDTIME 06/02/19   Tower, [name], MD  pregabalin (LYRICA) 75 MG capsule Take 1 capsule (75 mg total) by mouth daily. 02/17/20 04/17/20  Shelly Coss, MD  rOPINIRole (REQUIP) 1 MG tablet TAKE 1/2 TABLET BY MOUTH EVERY MORNING and TAKE THREE TABLETS BY MOUTH EVERYDAY AT BEDTIME 12/21/19   Tower, [name], MD  sertraline (ZOLOFT) 100 MG tablet TAKE TWO TABLETS BY MOUTH EVERY MORNING 03/06/20   Thayer Headings, PMHNP  Suvorexant (BELSOMRA) 20 MG TABS Take 1 tablet by mouth at bedtime. 04/16/20   Thayer Headings, PMHNP  traZODone (DESYREL) 100 MG tablet TAKE ONE TABLET BY MOUTH EVERYDAY AT BEDTIME 03/06/20   Thayer Headings, PMHNP    Family History  Problem Relation Age of Onset  . Osteoporosis Mother   . Hyperlipidemia Mother   . Depression Mother   . Cancer Mother        skin cancer ? basal cell and lung ca heavy smoker  .  Alcohol abuse Father   . Cancer Father        skin CA ? basal cell  . Heart disease Father        CHF  . Hyperlipidemia Brother   . Hypertension Brother   . Breast cancer Neg Hx      Social History   Tobacco Use  . Smoking status: Former Smoker    Packs/day: 0.50    Years: 6.00    Pack years: 3.00    Types: Cigarettes    Quit date: 03/10/1984    Years since quitting: 36.1  . Smokeless tobacco: Never Used  Vaping Use  . Vaping Use: Never used  Substance Use Topics  . Alcohol use: No    Alcohol/week: 0.0 standard drinks  . Drug use: No    Allergies as of 04/17/2020 - Review Complete 03/05/2020  Allergen Reaction Noted  . Amoxicillin Swelling 07/18/2007  . Fluconazole Rash   . Difuleron [ferrous fumarate-dss]  12/08/2017  . Other Other (See Comments) 05/10/2017    Review of Systems:    All systems reviewed and negative except where noted in HPI.   Observations/Objective:  Labs: CBC    Component Value Date/Time   WBC 5.7 02/17/2020 0600   RBC 3.75 (L) 02/17/2020 0600   HGB 10.5 (L) 02/17/2020 0600   HGB 11.5 (L) 03/01/2014 0846   HCT 32.9 (L) 02/17/2020 0600   HCT 36.4 03/01/2014 0846   PLT 231 02/17/2020 0600   PLT 254 03/01/2014 0846   MCV 87.7 02/17/2020 0600   MCV 81 03/01/2014 0846   MCH 28.0 02/17/2020 0600   MCHC 31.9 02/17/2020 0600   RDW 14.6 02/17/2020 0600   RDW 15.6 (H) 03/01/2014 0846   LYMPHSABS 0.8 02/17/2020 0600   LYMPHSABS 1.0 03/01/2014 0846   MONOABS 0.4 02/17/2020 0600   MONOABS 0.3 03/01/2014 0846   EOSABS 0.1 02/17/2020 0600   EOSABS 0.1 03/01/2014 0846   BASOSABS 0.0 02/17/2020 0600   BASOSABS 0.0 03/01/2014 0846   CMP     Component Value Date/Time   NA 139 02/17/2020 0600   NA 142 03/01/2014 0846   K 3.9 02/17/2020 0600   K 4.3 03/01/2014 0846   CL 100 02/17/2020 0600   CL 102 03/01/2014 0846   CO2 27 02/17/2020 0600   CO2 28 03/01/2014 0846  GLUCOSE 148 (H) 02/17/2020 0600   GLUCOSE 219 (H) 03/01/2014 0846   BUN  14 02/17/2020 0600   BUN 12 03/01/2014 0846   CREATININE 0.80 02/17/2020 0600   CREATININE 0.95 03/01/2014 0846   CALCIUM 9.1 02/17/2020 0600   CALCIUM 8.6 03/01/2014 0846   PROT 7.4 10/20/2019 0950   PROT 7.5 03/01/2014 0846   ALBUMIN 3.7 10/20/2019 0950   ALBUMIN 3.4 03/01/2014 0846   AST 23 10/20/2019 0950   AST 20 03/01/2014 0846   ALT 19 10/20/2019 0950   ALT 28 03/01/2014 0846   ALKPHOS 74 10/20/2019 0950   ALKPHOS 122 (H) 03/01/2014 0846   BILITOT 0.3 10/20/2019 0950   BILITOT 0.2 03/01/2014 0846   GFRNONAA >60 02/17/2020 0600   GFRNONAA >60 03/01/2014 0846   GFRNONAA >60 08/24/2013 1015   GFRAA >60 10/20/2019 0950   GFRAA >60 03/01/2014 0846   GFRAA >60 08/24/2013 1015    Imaging Studies: No results found.  Assessment and Plan:   Suzanne Strong is a 75 y.o. y/o female has been referred for dysphagia  Assessment and Plan: Patient has ongoing dysphagia issues and has previous history of tonsillar cancer with chemoradiation EGD would help evaluate for any underlying strictures or lesions  EGD would need to wait for a few weeks as per [place] Protocol for Covid positive patients as patient states she was positive 4 days ago  Soft diet recommended until then  CRC  screening colonoscopy  I have discussed alternative options, risks & benefits,  which include, but are not limited to, bleeding, infection, perforation,respiratory complication & drug reaction.  The patient agrees with this plan & written consent will be obtained.    Alternative options of conservative management were discussed in detail, including but not limited to medication management, foregoing endoscopic procedures at this time and others.    Follow Up Instructions:   I discussed the assessment and treatment plan with the patient. The patient was provided an opportunity to ask questions and all were answered. The patient agreed with the plan and demonstrated an understanding of the  instructions.   The patient was advised to call back or seek an in-person evaluation if the symptoms worsen or if the condition fails to improve as anticipated.  I provided 15 minutes of face-to-face time via video software during this encounter.  Additional time was spent in reviewing patient's chart, placing orders etc.   Virgel Manifold, MD  Speech recognition software was used to dictate the above note.

## 2020-04-18 NOTE — Progress Notes (Signed)
Suzanne Strong [address]  [place], [place] [address]  Main: [phone]  Fax: [phone]   Gastroenterology Consultation  Referring Provider:     [name], MD Primary Care Physician:  Tower, [name], MD Reason for Consultation:     Dysphagia        HPI:   Chief complaint: Dysphagia  Suzanne Strong is a 75 y.o. y/o female referred for consultation & management  by Dr. Glori Bickers, [name], MD.  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  Has not had any recent upper endoscopy.  Upper endoscopy in 2011 with gastric polyps seen otherwise normal.  Patient has undergone speech and swallow evaluation with further GI evaluation recommended.  Patient was recently evaluated in the hospital by Dr. Haig Prophet and an EGD was recommended as an inpatient or outpatient.  Patient was requiring supplemental oxygen as an inpatient but is not at this time  She had a Cologuard in 2018 and none since then.  Cologuard at that time was negative  Past Medical History:  Diagnosis Date  . Anemia    iron deficiency and B12 deficiency  . Anxiety   . Breast cancer ([place]) 02/25/2015   upper-outer quadrant of left female breast, Triple Negative. Neo-adjuvant chemo with complete pathologic response.   . Cervical dysplasia    conization  . Diabetes mellitus without complication ([place])   . Diverticulosis   . Fever 04/11/2015  . Gastroparesis    related to previous radiation tx  . GERD (gastroesophageal reflux disease)   . Hyperlipidemia   . Hypertension   . Neuropathy    legs/feet, S/P chemo meds  . Personal history of chemotherapy   . Personal history of radiation therapy 2017   LEFT lumpectomy w/ radiation  . RLS (restless legs syndrome)   . Shingles    Hx of  . 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[place]) 2006  . Wears dentures    partial bottom    Past Surgical History:   Procedure Laterality Date  . APPENDECTOMY    . BREAST BIOPSY Left 02/25/2015   INVASIVE MAMMARY CARCINOMA,Triple negative.  [name] BREAST CYST ASPIRATION    . BREAST EXCISIONAL BIOPSY Left 12/2015   surgery  . BREAST LUMPECTOMY WITH NEEDLE LOCALIZATION Left 10/02/2015   Procedure: BREAST LUMPECTOMY WITH NEEDLE LOCALIZATION;  Surgeon: [name], MD;  Location: [facility];  Service: General;  Laterality: Left;  . BREAST LUMPECTOMY WITH SENTINEL LYMPH NODE BIOPSY Left 10/02/2015   Procedure: LEFT BREAST WIDE EXCISION WITH SENTINEL LYMPH NODE BX;  Surgeon: [name], MD;  Location: [facility];  Service: General;  Laterality: Left;  . BREAST SURGERY     breast biopsy benign  . CARPAL TUNNEL RELEASE Right 05/19/2017   Procedure: CARPAL TUNNEL RELEASE;  Surgeon: Earnestine Leys, MD;  Location: [facility];  Service: Orthopedics;  Laterality: Right;  . CATARACT EXTRACTION W/PHACO Right 10/26/2018   Procedure: CATARACT EXTRACTION PHACO AND INTRAOCULAR LENS PLACEMENT ([place]) right diabetic;  Surgeon: Leandrew Koyanagi, MD;  Location: [facility];  Service: Ophthalmology;  Laterality: Right;  Diabetic - oral meds cancer center been notified and will come  . CATARACT EXTRACTION W/PHACO Left 11/16/2018   Procedure: CATARACT EXTRACTION PHACO AND INTRAOCULAR LENS PLACEMENT ([facility]) LEFT DIABETIC  01:01.0  17.0%  10.37;  Surgeon: Leandrew Koyanagi, MD;  Location: [facility];  Service: Ophthalmology;  Laterality: Left;  Diabetic -  oral meds Port-a-cath  . CHOLECYSTECTOMY    . Cologuard  07/22/2016   Negative  . ESOPHAGOGASTRODUODENOSCOPY  07/2009   normal with few gastric polyps  . MOLE REMOVAL    . PORT-A-CATH REMOVAL    . PORTACATH PLACEMENT    . PORTACATH PLACEMENT Right 03/12/2015   Procedure: INSERTION PORT-A-CATH;  Surgeon: [name], MD;  Location: [facility];  Service: General;  Laterality: Right;  . RADICAL NECK DISSECTION    . TONSILLECTOMY     cancer treated with chemo  and radiation  . TRIGGER FINGER RELEASE Right 05/19/2017   Procedure: RELEASE TRIGGER FINGER/A-1 PULLEY ring finger;  Surgeon: Earnestine Leys, MD;  Location: [facility];  Service: Orthopedics;  Laterality: Right;    Prior to Admission medications   Medication Sig Start Date End Date Taking? Authorizing Provider  albuterol (VENTOLIN HFA) 108 (90 Base) MCG/ACT inhaler INHALE TWO PUFFS BY MOUTH EVERY SIX HOURS AS NEEDED 01/09/20   Tower, [name], MD  ALPRAZolam Duanne [place]on) 0.5 MG tablet TAKE ONE TABLET BY MOUTH twice daily AS NEEDED FOR ANXIETY OR SLEEP 03/06/20   Thayer Headings, PMHNP  amitriptyline (ELAVIL) 50 MG tablet Take 1 tablet (50 mg total) by mouth at bedtime. 12/07/16   Cammie Sickle, MD  amLODipine (NORVASC) 5 MG tablet Take 1 tablet (5 mg total) by mouth daily. 02/18/20 04/18/20  Shelly Coss, MD  atorvastatin (LIPITOR) 10 MG tablet TAKE 1 TABLET(10 MG) BY MOUTH DAILY 06/02/19   Tower, [name], MD  b complex vitamins tablet Take 1 tablet by mouth daily.    [provider]  busPIRone (BUSPAR) 15 MG tablet Take 1 tablet (15 mg total) by mouth 2 (two) times daily. 02/07/20 03/08/20  Thayer Headings, PMHNP  Cholecalciferol (VITAMIN D-1000 MAX ST) 25 MCG (1000 UT) tablet Take 2,000 Units by mouth daily. Takes three 50 mcg tablets, 3 capsules daily    [provider]  ferrous sulfate 325 (65 FE) MG tablet Take 325 mg by mouth daily with breakfast.    [provider]  glipiZIDE (GLUCOTROL) 5 MG tablet Take 5 mg by mouth 2 (two) times daily. 03/22/20   [provider]  glucose blood test strip To check blood glucose once daily and prn for diabetes 10/20/19   Tower, [name], MD  JANUMET XR 50-1000 MG TB24 Take 1 tablet by mouth 2 (two) times daily.  03/13/15   [provider]  lidocaine-prilocaine (EMLA) cream Apply 1 application topically as needed. Apply to port when going through Chemo    [provider]  Multiple Vitamins-Minerals (WOMENS  MULTIVITAMIN PO) Take by mouth.    [provider]  Na Sulfate-K Sulfate-Mg Sulf 17.5-3.13-1.6 GM/177ML SOLN At 5 PM the day before procedure take 1 bottle and 5 hours before procedure take 1 bottle. 04/17/20   Virgel Manifold, MD  omeprazole (PRILOSEC) 20 MG capsule TAKE 1 CAPSULE(20 MG) BY MOUTH AT BEDTIME 06/02/19   Tower, [name], MD  pregabalin (LYRICA) 75 MG capsule Take 1 capsule (75 mg total) by mouth daily. 02/17/20 04/17/20  Shelly Coss, MD  rOPINIRole (REQUIP) 1 MG tablet TAKE 1/2 TABLET BY MOUTH EVERY MORNING and TAKE THREE TABLETS BY MOUTH EVERYDAY AT BEDTIME 12/21/19   Tower, [name], MD  sertraline (ZOLOFT) 100 MG tablet TAKE TWO TABLETS BY MOUTH EVERY MORNING 03/06/20   Thayer Headings, PMHNP  Suvorexant (BELSOMRA) 20 MG TABS Take 1 tablet by mouth at bedtime. 04/16/20   Thayer Headings, PMHNP  traZODone (  DESYREL) 100 MG tablet TAKE ONE TABLET BY MOUTH EVERYDAY AT BEDTIME 03/06/20   Thayer Headings, PMHNP    Family History  Problem Relation Age of Onset  . Osteoporosis Mother   . Hyperlipidemia Mother   . Depression Mother   . Cancer Mother        skin cancer ? basal cell and lung ca heavy smoker  . Alcohol abuse Father   . Cancer Father        skin CA ? basal cell  . Heart disease Father        CHF  . Hyperlipidemia Brother   . Hypertension Brother   . Breast cancer Neg Hx      Social History   Tobacco Use  . Smoking status: Former Smoker    Packs/day: 0.50    Years: 6.00    Pack years: 3.00    Types: Cigarettes    Quit date: 03/10/1984    Years since quitting: 36.1  . Smokeless tobacco: Never Used  Vaping Use  . Vaping Use: Never used  Substance Use Topics  . Alcohol use: No    Alcohol/week: 0.0 standard drinks  . Drug use: No    Allergies as of 04/17/2020 - Review Complete 03/05/2020  Allergen Reaction Noted  . Amoxicillin Swelling 07/18/2007  . Fluconazole Rash   . Difuleron [ferrous fumarate-dss]  12/08/2017  . Other Other (See Comments)  05/10/2017    Review of Systems:    All systems reviewed and negative except where noted in HPI.   Physical Exam:  There were no vitals taken for this visit. No LMP recorded. Patient is postmenopausal. Psych:  Alert and cooperative. Normal mood and affect. General:   Alert,  Well-developed, well-nourished, pleasant and cooperative in NAD Head:  Normocephalic and atraumatic. Eyes:  Sclera clear, no icterus.   Conjunctiva pink. Ears:  Normal auditory acuity. Nose:  No deformity, discharge, or lesions. Mouth:  No deformity or lesions,oropharynx pink & moist. Neck:  Supple; no masses or thyromegaly. Abdomen:  Normal bowel sounds.  No bruits.  Soft, non-tender and non-distended without masses, hepatosplenomegaly or hernias noted.  No guarding or rebound tenderness.    Msk:  Symmetrical without gross deformities. Good, equal movement & strength bilaterally. Pulses:  Normal pulses noted. Extremities:  No clubbing or edema.  No cyanosis. Neurologic:  Alert and oriented x3;  grossly normal neurologically. Skin:  Intact without significant lesions or rashes. No jaundice. Lymph Nodes:  No significant cervical adenopathy. Psych:  Alert and cooperative. Normal mood and affect.   Labs: CBC    Component Value Date/Time   WBC 5.7 02/17/2020 0600   RBC 3.75 (L) 02/17/2020 0600   HGB 10.5 (L) 02/17/2020 0600   HGB 11.5 (L) 03/01/2014 0846   HCT 32.9 (L) 02/17/2020 0600   HCT 36.4 03/01/2014 0846   PLT 231 02/17/2020 0600   PLT 254 03/01/2014 0846   MCV 87.7 02/17/2020 0600   MCV 81 03/01/2014 0846   MCH 28.0 02/17/2020 0600   MCHC 31.9 02/17/2020 0600   RDW 14.6 02/17/2020 0600   RDW 15.6 (H) 03/01/2014 0846   LYMPHSABS 0.8 02/17/2020 0600   LYMPHSABS 1.0 03/01/2014 0846   MONOABS 0.4 02/17/2020 0600   MONOABS 0.3 03/01/2014 0846   EOSABS 0.1 02/17/2020 0600   EOSABS 0.1 03/01/2014 0846   BASOSABS 0.0 02/17/2020 0600   BASOSABS 0.0 03/01/2014 0846   CMP     Component Value  Date/Time   NA 139 02/17/2020 0600  NA 142 03/01/2014 0846   K 3.9 02/17/2020 0600   K 4.3 03/01/2014 0846   CL 100 02/17/2020 0600   CL 102 03/01/2014 0846   CO2 27 02/17/2020 0600   CO2 28 03/01/2014 0846   GLUCOSE 148 (H) 02/17/2020 0600   GLUCOSE 219 (H) 03/01/2014 0846   BUN 14 02/17/2020 0600   BUN 12 03/01/2014 0846   CREATININE 0.80 02/17/2020 0600   CREATININE 0.95 03/01/2014 0846   CALCIUM 9.1 02/17/2020 0600   CALCIUM 8.6 03/01/2014 0846   PROT 7.4 10/20/2019 0950   PROT 7.5 03/01/2014 0846   ALBUMIN 3.7 10/20/2019 0950   ALBUMIN 3.4 03/01/2014 0846   AST 23 10/20/2019 0950   AST 20 03/01/2014 0846   ALT 19 10/20/2019 0950   ALT 28 03/01/2014 0846   ALKPHOS 74 10/20/2019 0950   ALKPHOS 122 (H) 03/01/2014 0846   BILITOT 0.3 10/20/2019 0950   BILITOT 0.2 03/01/2014 0846   GFRNONAA >60 02/17/2020 0600   GFRNONAA >60 03/01/2014 0846   GFRNONAA >60 08/24/2013 1015   GFRAA >60 10/20/2019 0950   GFRAA >60 03/01/2014 0846   GFRAA >60 08/24/2013 1015    Imaging Studies: No results found.  Assessment and Plan:   HENRYETTA CORRIVEAU is a 75 y.o. y/o female has been referred for dysphagia  Patient has ongoing dysphagia issues and has previous history of tonsillar cancer with chemoradiation EGD would help evaluate for any underlying strictures or lesions  EGD would need to wait for a few weeks as per [place] Protocol for Covid positive patients as patient states she was positive 4 days ago  Soft diet recommended until then  CRC  screening colonoscopy  I have discussed alternative options, risks & benefits,  which include, but are not limited to, bleeding, infection, perforation,respiratory complication & drug reaction.  The patient agrees with this plan & written consent will be obtained.    Alternative options of conservative management were discussed in detail, including but not limited to medication management, foregoing endoscopic procedures at this time  and others.     Dr Suzanne Strong  Speech recognition software was used to dictate the above note.

## 2020-04-19 NOTE — Telephone Encounter (Signed)
I will reply back to the patient about her procedure, prep and date.

## 2020-04-23 NOTE — Telephone Encounter (Signed)
Patient called LVM to call her back to reschedule her EGD & Colonosocpy

## 2020-04-24 ENCOUNTER — Other Ambulatory Visit: Payer: Self-pay

## 2020-04-24 DIAGNOSIS — Z1211 Encounter for screening for malignant neoplasm of colon: Secondary | ICD-10-CM

## 2020-04-24 DIAGNOSIS — R1310 Dysphagia, unspecified: Secondary | ICD-10-CM

## 2020-04-24 NOTE — Telephone Encounter (Signed)
Called patient and left her a detailed message letting her know that I will change her procedure date to March 28th as requested by her. I also let her know that she will have it done in [place]. I will also send her a new paperwork by MyChart letting her know that I did what she requested.

## 2020-05-01 ENCOUNTER — Telehealth: Payer: Self-pay

## 2020-05-01 NOTE — Chronic Care Management (AMB) (Signed)
Chronic Care Management Pharmacy Assistant   Name: Suzanne Strong  MRN: [id number] DOB: 1945/04/04  Reason for Encounter: Patient Assistance Application- [place]   Recent office visits:  N/A  Recent consult visits:  04/17/20- Dr. [name]- Gastroenterology  [facility] visits:  Medication Reconciliation was completed by comparing discharge summary, patient's EMR and Pharmacy list, and upon discussion with patient.  Admitted to the hospital on 02/14/20 due to Hypoxemia. Discharge date was 02/17/20. Discharged from [facility].    New?Medications Started at [facility] Discharge:?? -Unable to locate any new medications  Medication Changes at Hospital Discharge: -Unable to locate any changes  Medications Discontinued at Hospital Discharge: -Unable to locate any discontinued medications  Medications that remain the same after Hospital Discharge:??  -All other medications will remain the same.    Medications: Outpatient Encounter Medications as of 05/01/2020  Medication Sig  . albuterol (VENTOLIN HFA) 108 (90 Base) MCG/ACT inhaler INHALE TWO PUFFS BY MOUTH EVERY SIX HOURS AS NEEDED  . ALPRAZolam (XANAX) 0.5 MG tablet TAKE ONE TABLET BY MOUTH twice daily AS NEEDED FOR ANXIETY OR SLEEP  . amitriptyline (ELAVIL) 50 MG tablet Take 1 tablet (50 mg total) by mouth at bedtime.  [name] amLODipine (NORVASC) 5 MG tablet Take 1 tablet (5 mg total) by mouth daily.  [name] atorvastatin (LIPITOR) 10 MG tablet TAKE 1 TABLET(10 MG) BY MOUTH DAILY  . b complex vitamins tablet Take 1 tablet by mouth daily.  . busPIRone (BUSPAR) 15 MG tablet Take 1 tablet (15 mg total) by mouth 2 (two) times daily.  . Cholecalciferol (VITAMIN D-1000 MAX ST) 25 MCG (1000 UT) tablet Take 2,000 Units by mouth daily. Takes three 50 mcg tablets, 3 capsules daily  . ferrous sulfate 325 (65 FE) MG tablet Take 325 mg by mouth daily with breakfast.  . glipiZIDE (GLUCOTROL) 5 MG tablet Take 5 mg by mouth 2 (two) times daily.   [name] glucose blood test strip To check blood glucose once daily and prn for diabetes  . JANUMET XR 50-1000 MG TB24 Take 1 tablet by mouth 2 (two) times daily.   [name] lidocaine-prilocaine (EMLA) cream Apply 1 application topically as needed. Apply to port when going through Chemo  . Multiple Vitamins-Minerals (WOMENS MULTIVITAMIN PO) Take by mouth.  . Na Sulfate-K Sulfate-Mg Sulf 17.5-3.13-1.6 GM/177ML SOLN At 5 PM the day before procedure take 1 bottle and 5 hours before procedure take 1 bottle.  [name] omeprazole (PRILOSEC) 20 MG capsule TAKE 1 CAPSULE(20 MG) BY MOUTH AT BEDTIME  . pregabalin (LYRICA) 75 MG capsule Take 1 capsule (75 mg total) by mouth daily.  [name] rOPINIRole (REQUIP) 1 MG tablet TAKE 1/2 TABLET BY MOUTH EVERY MORNING and TAKE THREE TABLETS BY MOUTH EVERYDAY AT BEDTIME  . sertraline (ZOLOFT) 100 MG tablet TAKE TWO TABLETS BY MOUTH EVERY MORNING  . Suvorexant (BELSOMRA) 20 MG TABS Take 1 tablet by mouth at bedtime.  . traZODone (DESYREL) 100 MG tablet TAKE ONE TABLET BY MOUTH EVERYDAY AT BEDTIME   Facility-Administered Encounter Medications as of 05/01/2020  Medication  . sodium chloride flush (NS) 0.9 % injection 10 mL   Spoke with patient regarding application for Janumet. She states that she was speaking with someone about filling out an application for Belsomra as well but can not remember who. Both medications are [employer] medications. She is going to bring both applications by Dr. Marliss Coots office once completed on 05/03/20. Will follow up 05/15/20 to get status update from [employer].  Star Rating Drugs: Atorvastatin  10 mg 04/17/20  30 DS Glipizide 5 mg  04/17/20 30 DS Janumet Xr 50-1000 mg  04/17/20 30 DS   Follow-Up:  Patient Assistance Coordination and Pharmacist Review  [name], CPP notified  Margaretmary Dys, [place] [phone]  Total time spent for month: 20

## 2020-05-02 DIAGNOSIS — H26491 Other secondary cataract, right eye: Secondary | ICD-10-CM | POA: Diagnosis not present

## 2020-05-02 LAB — HM DIABETES EYE EXAM

## 2020-05-03 ENCOUNTER — Other Ambulatory Visit: Payer: Self-pay

## 2020-05-03 ENCOUNTER — Ambulatory Visit (INDEPENDENT_AMBULATORY_CARE_PROVIDER_SITE_OTHER): Payer: PPO | Admitting: Psychiatry

## 2020-05-03 ENCOUNTER — Encounter: Payer: Self-pay | Admitting: Psychiatry

## 2020-05-03 DIAGNOSIS — G4700 Insomnia, unspecified: Secondary | ICD-10-CM | POA: Diagnosis not present

## 2020-05-03 DIAGNOSIS — F411 Generalized anxiety disorder: Secondary | ICD-10-CM | POA: Diagnosis not present

## 2020-05-03 MED ORDER — BELSOMRA 20 MG PO TABS: 1.0000 | ORAL_TABLET | Freq: Every day | ORAL | 2 refills | Status: DC

## 2020-05-03 MED ORDER — SERTRALINE HCL 100 MG PO TABS: ORAL_TABLET | ORAL | 5 refills | Status: DC

## 2020-05-03 MED ORDER — TRAZODONE HCL 50 MG PO TABS
ORAL_TABLET | ORAL | 2 refills | Status: DC
Start: 2020-05-03 — End: 2020-05-28

## 2020-05-03 MED ORDER — ALPRAZOLAM 0.5 MG PO TABS
ORAL_TABLET | ORAL | 2 refills | Status: DC
Start: 2020-05-15 — End: 2020-05-09
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
## 2020-05-07 ENCOUNTER — Other Ambulatory Visit: Payer: Self-pay | Admitting: Psychiatry

## 2020-05-07 ENCOUNTER — Ambulatory Visit
Admission: RE | Admit: 2020-05-07 | Discharge: 2020-05-07 | Disposition: A | Discharge status: Home / Self Care | Payer: PPO | Source: Ambulatory Visit | Attending: General Surgery | Admitting: General Surgery

## 2020-05-07 ENCOUNTER — Other Ambulatory Visit: Payer: Self-pay

## 2020-05-07 DIAGNOSIS — Z853 Personal history of malignant neoplasm of breast: Secondary | ICD-10-CM | POA: Insufficient documentation

## 2020-05-07 DIAGNOSIS — F411 Generalized anxiety disorder: Secondary | ICD-10-CM

## 2020-05-07 DIAGNOSIS — Z1231 Encounter for screening mammogram for malignant neoplasm of breast: Secondary | ICD-10-CM | POA: Insufficient documentation

## 2020-05-07 DIAGNOSIS — C50412 Malignant neoplasm of upper-outer quadrant of left female breast: Secondary | ICD-10-CM

## 2020-05-07 DIAGNOSIS — Z171 Estrogen receptor negative status [ER-]: Secondary | ICD-10-CM | POA: Diagnosis present

## 2020-05-07 DIAGNOSIS — G4700 Insomnia, unspecified: Secondary | ICD-10-CM

## 2020-05-09 ENCOUNTER — Other Ambulatory Visit: Admission: RE | Admit: 2020-05-09 | Payer: PPO | Source: Ambulatory Visit

## 2020-05-09 ENCOUNTER — Telehealth: Payer: Self-pay

## 2020-05-09 ENCOUNTER — Other Ambulatory Visit: Payer: Self-pay | Admitting: General Surgery

## 2020-05-09 DIAGNOSIS — N6489 Other specified disorders of breast: Secondary | ICD-10-CM

## 2020-05-09 DIAGNOSIS — R928 Other abnormal and inconclusive findings on diagnostic imaging of breast: Secondary | ICD-10-CM

## 2020-05-09 NOTE — Telephone Encounter (Signed)
Please review

## 2020-05-09 NOTE — Chronic Care Management (AMB) (Addendum)
Chronic Care Management Pharmacy Assistant   Name: Suzanne Strong  MRN: [id number] DOB: 10-Apr-1945  Reason for Encounter: Medication Review- Medication Adherence and Delivery Coordination   Recent office visits:  No recent office visits  Recent consult visits:  05/03/20- Thayer Headings, [place] Hospital visits:  Admitted to the hospital on 02/14/20 due to Hypoxemia. Discharge date was 02/17/20. Discharged from [facility].      Medications: Outpatient Encounter Medications as of 05/09/2020  Medication Sig   albuterol (VENTOLIN HFA) 108 (90 Base) MCG/ACT inhaler INHALE TWO PUFFS BY MOUTH EVERY SIX HOURS AS NEEDED   [START ON 05/15/2020] ALPRAZolam (XANAX) 0.5 MG tablet TAKE ONE TABLET BY MOUTH twice daily AS NEEDED FOR ANXIETY OR SLEEP   amitriptyline (ELAVIL) 50 MG tablet Take 1 tablet (50 mg total) by mouth at bedtime.   amLODipine (NORVASC) 5 MG tablet Take 1 tablet (5 mg total) by mouth daily.   atorvastatin (LIPITOR) 10 MG tablet TAKE 1 TABLET(10 MG) BY MOUTH DAILY   b complex vitamins tablet Take 1 tablet by mouth daily.   busPIRone (BUSPAR) 15 MG tablet Take 1 tablet (15 mg total) by mouth 2 (two) times daily.   Cholecalciferol (VITAMIN D-1000 MAX ST) 25 MCG (1000 UT) tablet Take 2,000 Units by mouth daily. Takes three 50 mcg tablets, 3 capsules daily   ferrous sulfate 325 (65 FE) MG tablet Take 325 mg by mouth daily with breakfast.   glipiZIDE (GLUCOTROL) 5 MG tablet Take 5 mg by mouth 2 (two) times daily.   glucose blood test strip To check blood glucose once daily and prn for diabetes   JANUMET XR 50-1000 MG TB24 Take 1 tablet by mouth 2 (two) times daily.    lidocaine-prilocaine (EMLA) cream Apply 1 application topically as needed. Apply to port when going through Chemo   Multiple Vitamins-Minerals (WOMENS MULTIVITAMIN PO) Take by mouth.   Na Sulfate-K Sulfate-Mg Sulf 17.5-3.13-1.6 GM/177ML SOLN At 5 PM the day before procedure take 1 bottle and 5  hours before procedure take 1 bottle.   omeprazole (PRILOSEC) 20 MG capsule TAKE 1 CAPSULE(20 MG) BY MOUTH AT BEDTIME   pregabalin (LYRICA) 75 MG capsule Take 1 capsule (75 mg total) by mouth daily.   rOPINIRole (REQUIP) 1 MG tablet TAKE 1/2 TABLET BY MOUTH EVERY MORNING and TAKE THREE TABLETS BY MOUTH EVERYDAY AT BEDTIME   sertraline (ZOLOFT) 100 MG tablet TAKE TWO TABLETS BY MOUTH EVERY MORNING   Suvorexant (BELSOMRA) 20 MG TABS Take 1 tablet by mouth at bedtime.   traZODone (DESYREL) 50 MG tablet Take 1/2-2 tabs po QHS prn   Facility-Administered Encounter Medications as of 05/09/2020  Medication   sodium chloride flush (NS) 0.9 % injection 10 mL   BP Readings from Last 3 Encounters:  02/17/20 (!) 173/83  01/05/20 128/70  11/15/19 112/70    Lab Results  Component Value Date   HGBA1C 6.1 (H) 02/15/2020   Multiple attempts were made to reach patient 05/09/20, 05/13/20, 05/14/20. Unsuccessful outreach.  Recent OV, Consult or Hospital visit: Thayer Headings, PMHNP- Behavior health (unable to access note) No medication changes indicated  Patient obtains medications through Adherence Packaging  30 Days   Last adherence delivery date: 04/18/20   Patient is due for next adherence delivery on: 05/17/20  Attempted to contact patient to review medications and coordinate delivery. Unsuccessful outreach  This delivery to include: Adherence Packaging  30 Days  Packs: Alpha lipoic acid 200 mg - 1 tablet daily (  bedtime) Amitriptyline 50 mg - 1 tablet daily (bedtime) Amlodipine 5 mg- 1 tablet daily (breakfast)  Atorvastatin 10 mg- 1 tablet daily (breakfast) Buspirone 15 mg- 1 tablet twice daily (breakfast and bedtime)  Glipizide 5 mg BID 1 tablet twice daily (breakfast and evening meal)  Janumet XR 50/1000 mg - 1 tablet two times a day (breakfast and evening meal)  Omeprazole 20 mg - 1 tablet daily (bedtime) Ropinirole 1 mg -  tablet once daily (breakfast) and 3 tablets once nightly  (bedtime) Sertraline 100 mg - 2 tablets daily (breakfast) Trazodone 100 mg - 1 tablet daily (bedtime)  PRN/VIAL medications: Alprazolam 0.5 mg- Take 1 tablet twice daily PRN Pregabalin 150 mg - 1 twice daily (breakfast and bedtime)   Note due for following medications this month: Belsomra 20 mg tabs-1 tab daily at bedtime (bedtime) [employer]CERNS - Unable to speak with patient to discuss if she wanted this included. Albuterol inhaler -Inhale 2 puffs every 6 hours as needed- PRN Centrum Silver Multivitamin - 1 tablet daily (breakfast)- OTC patient buys OneTouch ultra test strips - use to check blood sugar once daily - Unable to speak with patient to discuss if she wanted this included. Vitamin B-Complex - 1 tablet daily ( breakfast)- Pt buys OTC Vitamin D3 50 mcg - 1 tablet twice daily (breakfast and bedtime) Pt buys OTC  Patient needs refills on the following medications:  Amlodipine - Dr.Amrit Adhikkari- Hospitalist- requested from Dr. Glori Bickers 05/14/20  Confirmed delivery date of 05/17/20, pharmacy will contact her the morning of delivery.  Unable to speak with patient regarding blood pressure or blood sugar readings  Contacted [employer] to follow up on patients application for Janumet. Per representative they have not received anything at this time.   Follow-Up:  Comptroller, Patient Assistance Coordination and Pharmacist Review  [name], CPP notified  Margaretmary Dys, [place] Assistant [phone]  I have reviewed the care management and care coordination activities outlined in this encounter and I am certifying that I agree with the content of this note. No further action required.  [name], PharmD Clinical Pharmacist [facility] at [facility] [phone]

## 2020-05-10 ENCOUNTER — Encounter: Payer: Self-pay | Admitting: Family Medicine

## 2020-05-13 ENCOUNTER — Ambulatory Visit: Admission: RE | Admit: 2020-05-13 | Payer: PPO | Source: Home / Self Care | Admitting: Gastroenterology

## 2020-05-13 ENCOUNTER — Encounter: Admission: RE | Payer: Self-pay | Source: Home / Self Care

## 2020-05-13 SURGERY — COLONOSCOPY WITH PROPOFOL
Anesthesia: Choice

## 2020-05-14 ENCOUNTER — Other Ambulatory Visit: Payer: Self-pay | Admitting: *Deleted

## 2020-05-14 DIAGNOSIS — Z853 Personal history of malignant neoplasm of breast: Secondary | ICD-10-CM | POA: Diagnosis not present

## 2020-05-14 NOTE — Telephone Encounter (Signed)
Pended to send to pharmacy of choice   Plan f/u in the next 1-2 mo please

## 2020-05-14 NOTE — Telephone Encounter (Signed)
-----   Message from [place] sent at 05/14/2020  9:08 AM EDT ----- Regarding: Refill Would Dr. Glori Bickers be willing to refill Amlodipine. It was last prescribed by a hospitalist. She is on 5 mg daily.   Thank you, Margaretmary Dys

## 2020-05-15 ENCOUNTER — Telehealth: Payer: Self-pay | Admitting: Gastroenterology

## 2020-05-15 MED ORDER — AMLODIPINE BESYLATE 5 MG PO TABS: 5.0000 mg | ORAL_TABLET | Freq: Every day | ORAL | 1 refills | Status: DC

## 2020-05-15 NOTE — Telephone Encounter (Signed)
Called patient back to her home and cell phone and she did not answer nor I was able to leave her a voicemail since her phone is full. I will send her a message by MyChart to ask her when she would like to reschedule her procedures.

## 2020-05-15 NOTE — Telephone Encounter (Signed)
Patient called Suzanne Strong - please call to  reschedule her procedure.

## 2020-05-15 NOTE — Telephone Encounter (Signed)
Med refilled and Morey Hummingbird will reach out to get appt scheduled

## 2020-05-16 NOTE — Telephone Encounter (Signed)
Called patient back again and she did not answer. However, I will send her a patient message and hopefully she could answer back that way.

## 2020-05-17 ENCOUNTER — Other Ambulatory Visit: Payer: Self-pay

## 2020-05-17 ENCOUNTER — Ambulatory Visit
Admission: RE | Admit: 2020-05-17 | Discharge: 2020-05-17 | Disposition: A | Discharge status: Home / Self Care | Payer: PPO | Source: Ambulatory Visit | Attending: General Surgery | Admitting: General Surgery

## 2020-05-17 DIAGNOSIS — R1310 Dysphagia, unspecified: Secondary | ICD-10-CM

## 2020-05-17 DIAGNOSIS — Z853 Personal history of malignant neoplasm of breast: Secondary | ICD-10-CM | POA: Diagnosis not present

## 2020-05-17 DIAGNOSIS — R922 Inconclusive mammogram: Secondary | ICD-10-CM | POA: Diagnosis not present

## 2020-05-17 DIAGNOSIS — Z1211 Encounter for screening for malignant neoplasm of colon: Secondary | ICD-10-CM

## 2020-05-17 DIAGNOSIS — R928 Other abnormal and inconclusive findings on diagnostic imaging of breast: Secondary | ICD-10-CM | POA: Diagnosis not present

## 2020-05-17 DIAGNOSIS — N6489 Other specified disorders of breast: Secondary | ICD-10-CM | POA: Insufficient documentation

## 2020-05-20 ENCOUNTER — Ambulatory Visit: Payer: PPO | Admitting: Radiation Oncology

## 2020-05-20 ENCOUNTER — Other Ambulatory Visit: Payer: PPO

## 2020-05-20 ENCOUNTER — Ambulatory Visit: Payer: PPO

## 2020-05-20 ENCOUNTER — Telehealth: Payer: Self-pay

## 2020-05-20 ENCOUNTER — Ambulatory Visit: Payer: PPO | Admitting: Internal Medicine

## 2020-05-20 NOTE — Telephone Encounter (Signed)
Patient Suzanne Strong she has questions regarding her procedures schedule with Dr. Bonna Gains on 05/24/20.  Please return call to patient.  Thanks,  [place], [place]

## 2020-05-21 NOTE — Telephone Encounter (Signed)
Called patient and left her a voicemail to call me back. 

## 2020-05-22 ENCOUNTER — Other Ambulatory Visit
Admission: RE | Admit: 2020-05-22 | Discharge: 2020-05-22 | Disposition: A | Discharge status: Home / Self Care | Payer: PPO | Source: Ambulatory Visit | Attending: Gastroenterology | Admitting: Gastroenterology

## 2020-05-22 ENCOUNTER — Other Ambulatory Visit: Payer: Self-pay

## 2020-05-22 DIAGNOSIS — Z20822 Contact with and (suspected) exposure to covid-19: Secondary | ICD-10-CM | POA: Insufficient documentation

## 2020-05-22 DIAGNOSIS — Z01812 Encounter for preprocedural laboratory examination: Secondary | ICD-10-CM | POA: Diagnosis not present

## 2020-05-22 LAB — SARS CORONAVIRUS 2 (TAT 6-24 HRS): SARS Coronavirus 2: NEGATIVE

## 2020-05-22 NOTE — Telephone Encounter (Signed)
Patient called LVM asking for call back

## 2020-05-22 NOTE — Telephone Encounter (Signed)
Called patient this morning again and she was able to finally answer my call. Patient wanted to know where she would need to call so she knows at what time to arrive on Friday morning. Therefore, instructions were given to patient as well as where to call. Patient then understood and had no further questions.

## 2020-05-23 ENCOUNTER — Ambulatory Visit: Payer: PPO | Admitting: Radiation Oncology

## 2020-05-24 ENCOUNTER — Encounter
Admission: RE | Disposition: A | Discharge status: Home / Self Care | Payer: Self-pay | Source: Home / Self Care | Attending: Gastroenterology

## 2020-05-24 ENCOUNTER — Encounter: Payer: Self-pay | Admitting: Gastroenterology

## 2020-05-24 ENCOUNTER — Ambulatory Visit
Admission: RE | Admit: 2020-05-24 | Discharge: 2020-05-24 | Disposition: A | Discharge status: Home / Self Care | Payer: PPO | Attending: Gastroenterology | Admitting: Gastroenterology

## 2020-05-24 ENCOUNTER — Other Ambulatory Visit: Payer: Self-pay

## 2020-05-24 ENCOUNTER — Ambulatory Visit: Payer: PPO | Admitting: Registered Nurse

## 2020-05-24 DIAGNOSIS — D122 Benign neoplasm of ascending colon: Secondary | ICD-10-CM | POA: Diagnosis not present

## 2020-05-24 DIAGNOSIS — Z888 Allergy status to other drugs, medicaments and biological substances status: Secondary | ICD-10-CM | POA: Diagnosis not present

## 2020-05-24 DIAGNOSIS — Z79899 Other long term (current) drug therapy: Secondary | ICD-10-CM | POA: Diagnosis not present

## 2020-05-24 DIAGNOSIS — Z883 Allergy status to other anti-infective agents status: Secondary | ICD-10-CM | POA: Diagnosis not present

## 2020-05-24 DIAGNOSIS — K3189 Other diseases of stomach and duodenum: Secondary | ICD-10-CM | POA: Diagnosis not present

## 2020-05-24 DIAGNOSIS — K2950 Unspecified chronic gastritis without bleeding: Secondary | ICD-10-CM | POA: Insufficient documentation

## 2020-05-24 DIAGNOSIS — G4733 Obstructive sleep apnea (adult) (pediatric): Secondary | ICD-10-CM | POA: Diagnosis not present

## 2020-05-24 DIAGNOSIS — K648 Other hemorrhoids: Secondary | ICD-10-CM | POA: Insufficient documentation

## 2020-05-24 DIAGNOSIS — Z7984 Long term (current) use of oral hypoglycemic drugs: Secondary | ICD-10-CM | POA: Diagnosis not present

## 2020-05-24 DIAGNOSIS — Z1211 Encounter for screening for malignant neoplasm of colon: Secondary | ICD-10-CM

## 2020-05-24 DIAGNOSIS — Z87891 Personal history of nicotine dependence: Secondary | ICD-10-CM | POA: Insufficient documentation

## 2020-05-24 DIAGNOSIS — K5790 Diverticulosis of intestine, part unspecified, without perforation or abscess without bleeding: Secondary | ICD-10-CM | POA: Diagnosis not present

## 2020-05-24 DIAGNOSIS — K635 Polyp of colon: Secondary | ICD-10-CM | POA: Diagnosis not present

## 2020-05-24 DIAGNOSIS — R1310 Dysphagia, unspecified: Secondary | ICD-10-CM | POA: Diagnosis not present

## 2020-05-24 DIAGNOSIS — K319 Disease of stomach and duodenum, unspecified: Secondary | ICD-10-CM

## 2020-05-24 DIAGNOSIS — K5730 Diverticulosis of large intestine without perforation or abscess without bleeding: Secondary | ICD-10-CM | POA: Insufficient documentation

## 2020-05-24 DIAGNOSIS — K317 Polyp of stomach and duodenum: Secondary | ICD-10-CM

## 2020-05-24 DIAGNOSIS — Z880 Allergy status to penicillin: Secondary | ICD-10-CM | POA: Diagnosis not present

## 2020-05-24 DIAGNOSIS — K219 Gastro-esophageal reflux disease without esophagitis: Secondary | ICD-10-CM | POA: Diagnosis not present

## 2020-05-24 HISTORY — PX: ESOPHAGOGASTRODUODENOSCOPY (EGD) WITH PROPOFOL: SHX5813

## 2020-05-24 HISTORY — PX: COLONOSCOPY WITH PROPOFOL: SHX5780

## 2020-05-24 LAB — GLUCOSE, CAPILLARY: Glucose-Capillary: 108 mg/dL — ABNORMAL HIGH (ref 70–99)

## 2020-05-24 SURGERY — COLONOSCOPY WITH PROPOFOL
Anesthesia: General

## 2020-05-24 MED ORDER — LIDOCAINE HCL (CARDIAC) PF 100 MG/5ML IV SOSY
PREFILLED_SYRINGE | INTRAVENOUS | Status: DC | PRN
  Administered 2020-05-24: 40 mg via INTRAVENOUS

## 2020-05-24 MED ORDER — PROPOFOL 10 MG/ML IV BOLUS
INTRAVENOUS | Status: AC
  Filled 2020-05-24: qty 20

## 2020-05-24 MED ORDER — SODIUM CHLORIDE 0.9 % IV SOLN: INTRAVENOUS | Status: DC

## 2020-05-24 MED ORDER — PROPOFOL 10 MG/ML IV BOLUS
INTRAVENOUS | Status: DC | PRN
  Administered 2020-05-24: 20 mg via INTRAVENOUS
  Administered 2020-05-24: 80 mg via INTRAVENOUS
  Administered 2020-05-24: 20 mg via INTRAVENOUS

## 2020-05-24 MED ORDER — PROPOFOL 500 MG/50ML IV EMUL
INTRAVENOUS | Status: AC
  Filled 2020-05-24: qty 50

## 2020-05-24 MED ORDER — PROPOFOL 500 MG/50ML IV EMUL
INTRAVENOUS | Status: DC | PRN
  Administered 2020-05-24: 150 ug/kg/min via INTRAVENOUS

## 2020-05-24 NOTE — Transfer of Care (Signed)
Immediate Anesthesia Transfer of Care Note  Patient: Suzanne Strong  Procedure(s) Performed: Procedure(s): COLONOSCOPY WITH PROPOFOL (N/A) ESOPHAGOGASTRODUODENOSCOPY (EGD) WITH PROPOFOL (N/A)  Patient Location: PACU and Endoscopy Unit  Anesthesia Type:General  Level of Consciousness: sedated  Airway & Oxygen Therapy: Patient Spontanous Breathing and Patient connected to nasal cannula oxygen  Post-op Assessment: Report given to RN and Post -op Vital signs reviewed and stable  Post vital signs: Reviewed and stable  Last Vitals:  Vitals:   05/24/20 0829 05/24/20 1004  BP: 140/78 134/61  Pulse: 82 85  Resp: 18 15  Temp: (!) 36.4 C   SpO2: 72% 89%    Complications: No apparent anesthesia complications

## 2020-05-24 NOTE — Progress Notes (Signed)
No risk at this time. 

## 2020-05-24 NOTE — Anesthesia Preprocedure Evaluation (Signed)
Anesthesia Evaluation  Patient identified by MRN, date of birth, ID band Patient awake    Reviewed: Allergy & Precautions, NPO status , Patient's Chart, lab work & pertinent test results  History of Anesthesia Complications Negative for: history of anesthetic complications  Airway Mallampati: IV  TM Distance: >3 FB Neck ROM: Full    Dental  (+) Partial Lower   Pulmonary sleep apnea , neg COPD, former smoker,    breath sounds clear to auscultation- rhonchi (-) wheezing      Cardiovascular hypertension, Pt. on medications (-) CAD, (-) Past MI, (-) Cardiac Stents and (-) CABG  Rhythm:Regular Rate:Normal - Systolic murmurs and - Diastolic murmurs    Neuro/Psych neg Seizures PSYCHIATRIC DISORDERS Anxiety Depression    GI/Hepatic Neg liver ROS, GERD  ,  Endo/Other  diabetes, Oral Hypoglycemic Agents  Renal/GU negative Renal ROS     Musculoskeletal negative musculoskeletal ROS (+)   Abdominal (+) + obese,   Peds  Hematology  (+) anemia ,   Anesthesia Other Findings Past Medical History: No date: Anemia     Comment:  iron deficiency and B12 deficiency No date: Anxiety 02/25/2015: Breast cancer ([place])     Comment:  upper-outer quadrant of left female breast, Triple               Negative. Neo-adjuvant chemo with complete pathologic               response.  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[employer]) No date: Wears dentures     Comment:  partial bottom   Reproductive/Obstetrics                             Anesthesia Physical Anesthesia Plan  ASA: III  Anesthesia Plan: General   Post-op Pain Management:    Induction: Intravenous  PONV Risk Score and Plan: 2 and Propofol infusion  Airway Management Planned: Natural Airway  Additional Equipment:   Intra-op Plan:   Post-operative Plan:   Informed Consent: I have reviewed the patients History and Physical, chart, labs and discussed the procedure including the risks, benefits and alternatives for the proposed anesthesia with the patient or authorized representative who has indicated his/her understanding and acceptance.     Dental advisory given  Plan Discussed with: CRNA and Anesthesiologist  Anesthesia Plan Comments:         Anesthesia Quick Evaluation

## 2020-05-24 NOTE — Op Note (Signed)
[facility] Patient Name: Suzanne Strong Procedure Date: 05/24/2020 9:02 AM MRN: [id number] Account #: [id number] Date of Birth: 05-20-1945 Admit Type: Outpatient Age: 75 Room: [facility] ENDO ROOM 2 Gender: Female Note Status: Finalized Procedure:             Upper GI endoscopy Indications:           Dysphagia Providers:             Jaydenn Boccio B. Bonna Gains MD, MD Referring MD:          [name]. Tower (Referring MD) Medicines:             Monitored Anesthesia Care Complications:         No immediate complications. Procedure:             Pre-Anesthesia Assessment:                        - Prior to the procedure, a History and Physical was                         performed, and patient medications, allergies and                         sensitivities were reviewed. The patient's tolerance                         of previous anesthesia was reviewed.                        - The risks and benefits of the procedure and the                         sedation options and risks were discussed with the                         patient. All questions were answered and informed                         consent was obtained.                        - Patient identification and proposed procedure were                         verified prior to the procedure by the physician, the                         nurse, the anesthesiologist, the anesthetist and the                         technician. The procedure was verified in the                         procedure room.                        - ASA Grade Assessment: II - A patient with mild                         systemic disease.  After obtaining informed consent, the endoscope was                         passed under direct vision. Throughout the procedure,                         the patient's blood pressure, pulse, and oxygen                         saturations were monitored continuously. The Endoscope                          was introduced through the mouth, and advanced to the                         second part of duodenum. The upper GI endoscopy was                         accomplished with ease. The patient tolerated the                         procedure well. Findings:      The examined esophagus was normal.      There is no endoscopic evidence of stenosis or stricture in the entire       esophagus. Biopsies were obtained from the proximal and distal esophagus       with cold forceps for histology of suspected eosinophilic esophagitis.      Patchy mildly erythematous mucosa without bleeding was found in the       gastric antrum. Biopsies were taken with a cold forceps for histology.       Biopsies were obtained in the gastric body, at the incisura and in the       gastric antrum with cold forceps for histology.      Multiple 3 to 5 mm sessile polyps with no stigmata of recent bleeding       were found in the gastric fundus and in the gastric body. Biopsies were       taken with a cold forceps for histology. Imaging was performed using       narrow band imaging to visualize the mucosa. A polyp in the fundus and       one in the body were biopsied. The one in the fundus may represent a       xanthoma      The exam of the stomach was otherwise normal.      The duodenal bulb, second portion of the duodenum and examined duodenum       were normal. Impression:            - Normal esophagus.                        - Erythematous mucosa in the antrum. Biopsied.                        - Multiple gastric polyps. Biopsied.                        - Normal duodenal bulb, second portion of the duodenum  and examined duodenum.                        - Biopsies were obtained in the gastric body, at the                         incisura and in the gastric antrum. Recommendation:        - Await pathology results.                        - Discharge patient to home (with  escort).                        - Advance diet as tolerated.                        - Continue present medications.                        - Patient has a contact number available for                         emergencies. The signs and symptoms of potential                         delayed complications were discussed with the patient.                         Return to normal activities tomorrow. Written                         discharge instructions were provided to the patient.                        - Discharge patient to home (with escort).                        - The findings and recommendations were discussed with                         the patient.                        - The findings and recommendations were discussed with                         the patient's family. Procedure Code(s):     --- Professional ---                        [phone], Esophagogastroduodenoscopy, flexible,                         transoral; with biopsy, single or multiple Diagnosis Code(s):     --- Professional ---                        K31.89, Other diseases of stomach and duodenum                        K31.7, Polyp of stomach and duodenum  R13.10, Dysphagia, unspecified CPT copyright 2019 American Medical Association. All rights reserved. The codes documented in this report are preliminary and upon coder review may  be revised to meet current compliance requirements.  [name], MD Margretta Sidle B. Bonna Gains MD, MD 05/24/2020 9:31:15 AM This report has been signed electronically. Number of Addenda: 0 Note Initiated On: 05/24/2020 9:02 AM Estimated Blood Loss:  Estimated blood loss: none.      [facility]

## 2020-05-24 NOTE — H&P (Signed)
[name], MD [address], [place], [place], [place], [address] [address], [place], [place], [place], [address] Phone: [phone]  Fax: [phone]  Primary Care Physician:  Tower, [name], MD   Pre-Procedure History & Physical: HPI:  Suzanne Strong is a 75 y.o. female is here for a colonoscopy and EGD.   Past Medical History:  Diagnosis Date  . Anemia    iron deficiency and B12 deficiency  . Anxiety   . Breast cancer ([place]) 02/25/2015   upper-outer quadrant of left female breast, Triple Negative. Neo-adjuvant chemo with complete pathologic response.   . Cervical dysplasia    conization  . Diabetes mellitus without complication ([place])   . Diverticulosis   . Fever 04/11/2015  . Gastroparesis    related to previous radiation tx  . GERD (gastroesophageal reflux disease)   . Hyperlipidemia   . Hypertension   . Neuropathy    legs/feet, S/P chemo meds  . Personal history of chemotherapy   . Personal history of radiation therapy 2017   LEFT lumpectomy w/ radiation  . RLS (restless legs syndrome)   . Shingles    Hx of  . 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 Wears dentures    partial bottom    Past Surgical History:  Procedure Laterality Date  . APPENDECTOMY    . BREAST BIOPSY Left 02/25/2015   INVASIVE MAMMARY CARCINOMA,Triple negative.  [name] BREAST CYST ASPIRATION    . BREAST EXCISIONAL BIOPSY Left 12/2015   surgery  . BREAST LUMPECTOMY WITH NEEDLE LOCALIZATION Left 10/02/2015   Procedure: BREAST LUMPECTOMY WITH NEEDLE LOCALIZATION;  Surgeon: [name], MD;  Location: [facility] ORS;  Service: General;  Laterality: Left;  . BREAST LUMPECTOMY WITH SENTINEL LYMPH NODE BIOPSY Left 10/02/2015   Procedure: LEFT BREAST WIDE EXCISION WITH SENTINEL LYMPH NODE BX;  Surgeon: [name], MD;  Location: [facility] OR[facility];  Service: General;  Laterality: Left;  . BREAST SURGERY     breast biopsy benign  . CARPAL TUNNEL RELEASE Right 05/19/2017   Procedure: CARPAL TUNNEL  RELEASE;  Surgeon: Earnestine Leys, MD;  Location: [facility];  Service: Orthopedics;  Laterality: Right;  . CATARACT EXTRACTION W/PHACO Right 10/26/2018   Procedure: CATARACT EXTRACTION PHACO AND INTRAOCULAR LENS PLACEMENT ([place]) right diabetic;  Surgeon: Leandrew Koyanagi, MD;  Location: [place];  Service: Ophthalmology;  Laterality: Right;  Diabetic - oral meds cancer center been notified and will come  . CATARACT EXTRACTION W/PHACO Left 11/16/2018   Procedure: CATARACT EXTRACTION PHACO AND INTRAOCULAR LENS PLACEMENT ([facility]) LEFT DIABETIC  01:01.0  17.0%  10.37;  Surgeon: Leandrew Koyanagi, MD;  Location: [place];  Service: Ophthalmology;  Laterality: Left;  Diabetic - oral meds Port-a-cath  . CHOLECYSTECTOMY    . Cologuard  07/22/2016   Negative  . ESOPHAGOGASTRODUODENOSCOPY  07/2009   normal with few gastric polyps  . MOLE REMOVAL    . PORT-A-CATH REMOVAL    . PORTACATH PLACEMENT    . PORTACATH PLACEMENT Right 03/12/2015   Procedure: INSERTION PORT-A-CATH;  Surgeon: [name], MD;  Location: [facility];  Service: General;  Laterality: Right;  . RADICAL NECK DISSECTION    . TONSILLECTOMY     cancer treated with chemo and radiation  . TRIGGER FINGER RELEASE Right 05/19/2017   Procedure: RELEASE TRIGGER FINGER/A-1 PULLEY ring finger;  Surgeon: Earnestine Leys, MD;  Location: [facility];  Service: Orthopedics;  Laterality: Right;    Prior to Admission medications   Medication Sig Start Date  End Date Taking? Authorizing Provider  amitriptyline (ELAVIL) 50 MG tablet Take 1 tablet (50 mg total) by mouth at bedtime. 12/07/16  Yes Cammie Sickle, MD  amLODipine (NORVASC) 5 MG tablet Take 1 tablet (5 mg total) by mouth daily. 05/15/20  Yes Tower, [name], MD  atorvastatin (LIPITOR) 10 MG tablet TAKE 1 TABLET(10 MG) BY MOUTH DAILY 06/02/19  Yes Tower, Marne A, MD  b complex vitamins tablet Take 1 tablet by mouth daily.   Yes [provider]  busPIRone  (BUSPAR) 15 MG tablet Take 1 tablet (15 mg total) by mouth 2 (two) times daily. 02/07/20 03/08/20 Yes Thayer Headings, PMHNP  Cholecalciferol (VITAMIN D-1000 MAX ST) 25 MCG (1000 UT) tablet Take 2,000 Units by mouth daily. Takes three 50 mcg tablets, 3 capsules daily   Yes [provider]  ferrous sulfate 325 (65 FE) MG tablet Take 325 mg by mouth daily with breakfast.   Yes [provider]  glipiZIDE (GLUCOTROL) 5 MG tablet Take 5 mg by mouth 2 (two) times daily. 03/22/20  Yes [provider]  JANUMET XR 50-1000 MG TB24 Take 1 tablet by mouth 2 (two) times daily.  03/13/15  Yes [provider]  Multiple Vitamins-Minerals (WOMENS MULTIVITAMIN PO) Take by mouth.   Yes [provider]  omeprazole (PRILOSEC) 20 MG capsule TAKE 1 CAPSULE(20 MG) BY MOUTH AT BEDTIME 06/02/19  Yes Tower, Marne A, MD  pregabalin (LYRICA) 75 MG capsule Take 1 capsule (75 mg total) by mouth daily. 02/17/20 04/17/20 Yes Adhikari, Tamsen Meek, MD  rOPINIRole (REQUIP) 1 MG tablet TAKE 1/2 TABLET BY MOUTH EVERY MORNING and TAKE THREE TABLETS BY MOUTH EVERYDAY AT BEDTIME 12/21/19  Yes Tower, Marne A, MD  sertraline (ZOLOFT) 100 MG tablet TAKE TWO TABLETS BY MOUTH EVERY MORNING 05/09/20  Yes Thayer Headings, PMHNP  Suvorexant (BELSOMRA) 20 MG TABS Take 1 tablet by mouth at bedtime. 05/03/20  Yes Thayer Headings, PMHNP  albuterol (VENTOLIN HFA) 108 (90 Base) MCG/ACT inhaler INHALE TWO PUFFS BY MOUTH EVERY SIX HOURS AS NEEDED 01/09/20   Tower, [name], MD  ALPRAZolam Duanne Moron) 0.5 MG tablet TAKE ONE TABLET BY MOUTH twice daily AS NEEDED FOR ANXIETY OR SLEEP 05/09/20   Thayer Headings, PMHNP  glucose blood test strip To check blood glucose once daily and prn for diabetes 10/20/19   Tower, [name], MD  lidocaine-prilocaine (EMLA) cream Apply 1 application topically as needed. Apply to port when going through Chemo    [provider]  Na Sulfate-K Sulfate-Mg Sulf 17.5-3.13-1.6 GM/177ML SOLN At 5 PM the day  before procedure take 1 bottle and 5 hours before procedure take 1 bottle. 04/17/20   Virgel Manifold, MD  traZODone (DESYREL) 50 MG tablet Take 1/2-2 tabs po QHS prn 05/03/20   Thayer Headings, PMHNP  traZODone (DESYREL) 50 MG tablet Take 1/2-2 tabs po QHS prn insomnia 05/09/20   Thayer Headings, PMHNP    Allergies as of 05/17/2020 - Review Complete 05/03/2020  Allergen Reaction Noted  . Amoxicillin Swelling 07/18/2007  . Fluconazole Rash   . Difuleron [ferrous fumarate-dss]  12/08/2017  . Other Other (See Comments) 05/10/2017    Family History  Problem Relation Age of Onset  . Osteoporosis Mother   . Hyperlipidemia Mother   . Depression Mother   . Cancer Mother        skin cancer ? basal cell and lung ca heavy smoker  . Alcohol abuse Father   . Cancer Father  skin CA ? basal cell  . Heart disease Father        CHF  . Hyperlipidemia Brother   . Hypertension Brother   . Breast cancer Neg Hx     Social History   Socioeconomic History  . Marital status: Widowed    Spouse name: Not on file  . Number of children: 0  . Years of education: 40  . Highest education level: Not on file  Occupational History  . Occupation: Clinical cytogeneticist  Tobacco Use  . Smoking status: Former Smoker    Packs/day: 0.50    Years: 6.00    Pack years: 3.00    Types: Cigarettes    Quit date: 03/10/1984    Years since quitting: 36.2  . Smokeless tobacco: Never Used  Vaping Use  . Vaping Use: Never used  Substance and Sexual Activity  . Alcohol use: No    Alcohol/week: 0.0 standard drinks  . Drug use: No  . Sexual activity: Not Currently  Other Topics Concern  . Not on file  Social History Narrative   Lives with husband in a one story home.  No children.        Works as Radiation protection practitioner for [employer].        Education: college.    Social Determinants of Health   Financial Resource Strain: Low Risk   . Difficulty of Paying Living Expenses: Not very hard  Food Insecurity: Not on  file  Transportation Needs: Not on file  Physical Activity: Not on file  Stress: Not on file  Social Connections: Not on file  Intimate Partner Violence: Not on file    Review of Systems: See HPI, otherwise negative ROS  Physical Exam: BP 140/78   Pulse 82   Temp (!) 97.5 F (36.4 C) (Temporal)   Resp 18   Ht 5\' 6"  (1.676 m)   Wt 99.8 kg   SpO2 95%   BMI 35.51 kg/m  General:   Alert,  pleasant and cooperative in NAD Head:  Normocephalic and atraumatic. Neck:  Supple; no masses or thyromegaly. Lungs:  Clear throughout to auscultation, normal respiratory effort.    Heart:  +S1, +S2, Regular rate and rhythm, No edema. Abdomen:  Soft, nontender and nondistended. Normal bowel sounds, without guarding, and without rebound.   Neurologic:  Alert and  oriented x4;  grossly normal neurologically.  Impression/Plan: Suzanne Strong is here for a colonoscopy to be performed for average risk screening and EGD for dysphagia.  Risks, benefits, limitations, and alternatives regarding the procedures have been reviewed with the patient.  Questions have been answered.  All parties agreeable.   Virgel Manifold, MD  05/24/2020, 8:44 AM

## 2020-05-24 NOTE — Anesthesia Postprocedure Evaluation (Signed)
Anesthesia Post Note  Patient: Suzanne Strong  Procedure(s) Performed: COLONOSCOPY WITH PROPOFOL (N/A ) ESOPHAGOGASTRODUODENOSCOPY (EGD) WITH PROPOFOL (N/A )  Patient location during evaluation: Endoscopy Anesthesia Type: General Level of consciousness: awake and alert and oriented Pain management: pain level controlled Vital Signs Assessment: post-procedure vital signs reviewed and stable Respiratory status: spontaneous breathing, nonlabored ventilation and respiratory function stable Cardiovascular status: blood pressure returned to baseline and stable Postop Assessment: no signs of nausea or vomiting Anesthetic complications: no   No complications documented.   Last Vitals:  Vitals:   05/24/20 1014 05/24/20 1024  BP: (!) 152/60 (!) 151/67  Pulse: 84 79  Resp: (!) 22 17  Temp:    SpO2: 94% 95%    Last Pain:  Vitals:   05/24/20 1024  TempSrc:   PainSc: 0-No pain                 Adonus Uselman

## 2020-05-24 NOTE — Anesthesia Procedure Notes (Signed)
Performed by: Doreen Salvage, CRNA Pre-anesthesia Checklist: Patient identified, Emergency Drugs available, Suction available and Patient being monitored Patient Re-evaluated:Patient Re-evaluated prior to induction Oxygen Delivery Method: Supernova nasal CPAP Induction Type: IV induction Dental Injury: Teeth and Oropharynx as per pre-operative assessment  Comments: Nasal cannula with etCO2 monitoring

## 2020-05-24 NOTE — Op Note (Signed)
[facility] Gastroenterology Patient Name: Suzanne Strong Procedure Date: 05/24/2020 9:00 AM MRN: [id number] Account #: [id number] Date of Birth: 04/28/1945 Admit Type: Outpatient Age: 75 Room: [facility] ENDO ROOM 2 Gender: Female Note Status: Finalized Procedure:             Colonoscopy Indications:           Screening for colorectal malignant neoplasm Providers:             Juletta Berhe B. Bonna Gains MD, MD Referring MD:          [name], MD Medicines:             Monitored Anesthesia Care Complications:         No immediate complications. Procedure:             Pre-Anesthesia Assessment:                        - ASA Grade Assessment: II - A patient with mild                         systemic disease.                        - Prior to the procedure, a History and Physical was                         performed, and patient medications, allergies and                         sensitivities were reviewed. The patient's tolerance                         of previous anesthesia was reviewed.                        - The risks and benefits of the procedure and the                         sedation options and risks were discussed with the                         patient. All questions were answered and informed                         consent was obtained.                        - Patient identification and proposed procedure were                         verified prior to the procedure by the physician, the                         nurse, the anesthesiologist, the anesthetist and the                         technician. The procedure was verified in the                         procedure room.  After obtaining informed consent, the colonoscope was                         passed under direct vision. Throughout the procedure,                         the patient's blood pressure, pulse, and oxygen                         saturations were monitored continuously.  The                         Colonoscope was introduced through the anus and                         advanced to the the cecum, identified by appendiceal                         orifice and ileocecal valve. The colonoscopy was                         performed with ease. The patient tolerated the                         procedure well. The quality of the bowel preparation                         was fair to poor, with solid stool and seeds present                         especially in the right colon that could not be                         cleared with suctioning. Findings:      The perianal and digital rectal examinations were normal.      A 7 mm polyp was found in the ascending colon. The polyp was sessile.       The polyp was removed with a cold snare. Resection and retrieval were       complete.      Multiple diverticula were found in the sigmoid colon.      The exam was otherwise without abnormality.      The rectum, sigmoid colon, descending colon, transverse colon, ascending       colon and cecum appeared normal.      Non-bleeding internal hemorrhoids were found during retroflexion. Impression:            - Preparation of the colon was poor.                        - One 7 mm polyp in the ascending colon, removed with                         a cold snare. Resected and retrieved.                        - Diverticulosis in the sigmoid colon.                        -  The examination was otherwise normal.                        - The rectum, sigmoid colon, descending colon,                         transverse colon, ascending colon and cecum are normal.                        - Non-bleeding internal hemorrhoids. Recommendation:        - Discharge patient to home (with escort).                        - Advance diet as tolerated.                        - Continue present medications.                        - Await pathology results.                        - Repeat colonoscopy with 6  months, with low fiber                         diet 5 days prior and 2 day prep.                        - The findings and recommendations were discussed with                         the patient.                        - The findings and recommendations were discussed with                         the patient's family.                        - Return to primary care physician as previously                         scheduled.                        - High fiber diet. Procedure Code(s):     --- Professional ---                        [phone], Colonoscopy, flexible; with removal of                         tumor(s), polyp(s), or other lesion(s) by snare                         technique Diagnosis Code(s):     --- Professional ---                        Z12.11, Encounter for screening for malignant neoplasm  of colon                        K63.5, Polyp of colon CPT copyright 2019 [employer]. All rights reserved. The codes documented in this report are preliminary and upon coder review may  be revised to meet current compliance requirements.  [name], MD Margretta Sidle B. Bonna Gains MD, MD 05/24/2020 10:15:47 AM This report has been signed electronically. Number of Addenda: 0 Note Initiated On: 05/24/2020 9:00 AM Scope Withdrawal Time: 0 hours 19 minutes 36 seconds  Total Procedure Duration: 0 hours 26 minutes 37 seconds  Estimated Blood Loss:  Estimated blood loss: none.      [facility]

## 2020-05-27 ENCOUNTER — Encounter: Payer: Self-pay | Admitting: Gastroenterology

## 2020-05-27 LAB — SURGICAL PATHOLOGY

## 2020-05-28 ENCOUNTER — Emergency Department: Payer: PPO

## 2020-05-28 ENCOUNTER — Ambulatory Visit
Admission: RE | Admit: 2020-05-28 | Discharge: 2020-05-28 | Disposition: A | Discharge status: Home / Self Care | Payer: PPO | Source: Ambulatory Visit | Attending: Radiation Oncology | Admitting: Radiation Oncology

## 2020-05-28 ENCOUNTER — Encounter: Payer: Self-pay | Admitting: Internal Medicine

## 2020-05-28 ENCOUNTER — Inpatient Hospital Stay (HOSPITAL_BASED_OUTPATIENT_CLINIC_OR_DEPARTMENT_OTHER): Payer: PPO | Admitting: Internal Medicine

## 2020-05-28 ENCOUNTER — Inpatient Hospital Stay
Admission: EM | Admit: 2020-05-28 | Discharge: 2020-05-31 | DRG: 871 | Disposition: A | Discharge status: Home / Self Care | Payer: PPO | Attending: Internal Medicine | Admitting: Internal Medicine

## 2020-05-28 ENCOUNTER — Encounter: Payer: Self-pay | Admitting: Radiation Oncology

## 2020-05-28 ENCOUNTER — Other Ambulatory Visit: Payer: Self-pay

## 2020-05-28 ENCOUNTER — Encounter: Payer: Self-pay | Admitting: *Deleted

## 2020-05-28 ENCOUNTER — Inpatient Hospital Stay: Payer: PPO | Attending: Internal Medicine

## 2020-05-28 DIAGNOSIS — Z7984 Long term (current) use of oral hypoglycemic drugs: Secondary | ICD-10-CM

## 2020-05-28 DIAGNOSIS — Z853 Personal history of malignant neoplasm of breast: Secondary | ICD-10-CM | POA: Diagnosis not present

## 2020-05-28 DIAGNOSIS — Z8249 Family history of ischemic heart disease and other diseases of the circulatory system: Secondary | ICD-10-CM

## 2020-05-28 DIAGNOSIS — R1310 Dysphagia, unspecified: Secondary | ICD-10-CM | POA: Diagnosis present

## 2020-05-28 DIAGNOSIS — R000 Tachycardia, unspecified: Secondary | ICD-10-CM | POA: Diagnosis not present

## 2020-05-28 DIAGNOSIS — I10 Essential (primary) hypertension: Secondary | ICD-10-CM | POA: Diagnosis present

## 2020-05-28 DIAGNOSIS — F32A Depression, unspecified: Secondary | ICD-10-CM | POA: Diagnosis not present

## 2020-05-28 DIAGNOSIS — F419 Anxiety disorder, unspecified: Secondary | ICD-10-CM | POA: Diagnosis present

## 2020-05-28 DIAGNOSIS — R6520 Severe sepsis without septic shock: Secondary | ICD-10-CM

## 2020-05-28 DIAGNOSIS — H26492 Other secondary cataract, left eye: Secondary | ICD-10-CM | POA: Diagnosis not present

## 2020-05-28 DIAGNOSIS — Z923 Personal history of irradiation: Secondary | ICD-10-CM

## 2020-05-28 DIAGNOSIS — J690 Pneumonitis due to inhalation of food and vomit: Secondary | ICD-10-CM | POA: Diagnosis present

## 2020-05-28 DIAGNOSIS — C50412 Malignant neoplasm of upper-outer quadrant of left female breast: Secondary | ICD-10-CM

## 2020-05-28 DIAGNOSIS — Z20822 Contact with and (suspected) exposure to covid-19: Secondary | ICD-10-CM | POA: Diagnosis present

## 2020-05-28 DIAGNOSIS — Z818 Family history of other mental and behavioral disorders: Secondary | ICD-10-CM

## 2020-05-28 DIAGNOSIS — Z9221 Personal history of antineoplastic chemotherapy: Secondary | ICD-10-CM

## 2020-05-28 DIAGNOSIS — E785 Hyperlipidemia, unspecified: Secondary | ICD-10-CM | POA: Diagnosis present

## 2020-05-28 DIAGNOSIS — R6521 Severe sepsis with septic shock: Secondary | ICD-10-CM | POA: Diagnosis present

## 2020-05-28 DIAGNOSIS — I517 Cardiomegaly: Secondary | ICD-10-CM | POA: Diagnosis not present

## 2020-05-28 DIAGNOSIS — A419 Sepsis, unspecified organism: Secondary | ICD-10-CM | POA: Diagnosis not present

## 2020-05-28 DIAGNOSIS — I248 Other forms of acute ischemic heart disease: Secondary | ICD-10-CM | POA: Diagnosis present

## 2020-05-28 DIAGNOSIS — Z79899 Other long term (current) drug therapy: Secondary | ICD-10-CM

## 2020-05-28 DIAGNOSIS — J984 Other disorders of lung: Secondary | ICD-10-CM | POA: Diagnosis not present

## 2020-05-28 DIAGNOSIS — Z791 Long term (current) use of non-steroidal anti-inflammatories (NSAID): Secondary | ICD-10-CM | POA: Diagnosis not present

## 2020-05-28 DIAGNOSIS — R4182 Altered mental status, unspecified: Secondary | ICD-10-CM | POA: Diagnosis not present

## 2020-05-28 DIAGNOSIS — J189 Pneumonia, unspecified organism: Secondary | ICD-10-CM | POA: Diagnosis not present

## 2020-05-28 DIAGNOSIS — C50919 Malignant neoplasm of unspecified site of unspecified female breast: Secondary | ICD-10-CM | POA: Diagnosis not present

## 2020-05-28 DIAGNOSIS — K117 Disturbances of salivary secretion: Secondary | ICD-10-CM | POA: Diagnosis present

## 2020-05-28 DIAGNOSIS — Z888 Allergy status to other drugs, medicaments and biological substances status: Secondary | ICD-10-CM

## 2020-05-28 DIAGNOSIS — D509 Iron deficiency anemia, unspecified: Secondary | ICD-10-CM | POA: Diagnosis not present

## 2020-05-28 DIAGNOSIS — K219 Gastro-esophageal reflux disease without esophagitis: Secondary | ICD-10-CM | POA: Diagnosis present

## 2020-05-28 DIAGNOSIS — K5790 Diverticulosis of intestine, part unspecified, without perforation or abscess without bleeding: Secondary | ICD-10-CM | POA: Diagnosis not present

## 2020-05-28 DIAGNOSIS — Z171 Estrogen receptor negative status [ER-]: Secondary | ICD-10-CM | POA: Diagnosis not present

## 2020-05-28 DIAGNOSIS — R8281 Pyuria: Secondary | ICD-10-CM

## 2020-05-28 DIAGNOSIS — Z85818 Personal history of malignant neoplasm of other sites of lip, oral cavity, and pharynx: Secondary | ICD-10-CM | POA: Diagnosis not present

## 2020-05-28 DIAGNOSIS — Z08 Encounter for follow-up examination after completed treatment for malignant neoplasm: Secondary | ICD-10-CM | POA: Diagnosis not present

## 2020-05-28 DIAGNOSIS — K317 Polyp of stomach and duodenum: Secondary | ICD-10-CM | POA: Diagnosis present

## 2020-05-28 DIAGNOSIS — R778 Other specified abnormalities of plasma proteins: Secondary | ICD-10-CM | POA: Diagnosis not present

## 2020-05-28 DIAGNOSIS — R0602 Shortness of breath: Secondary | ICD-10-CM | POA: Diagnosis not present

## 2020-05-28 DIAGNOSIS — E1142 Type 2 diabetes mellitus with diabetic polyneuropathy: Secondary | ICD-10-CM | POA: Diagnosis present

## 2020-05-28 DIAGNOSIS — G2581 Restless legs syndrome: Secondary | ICD-10-CM | POA: Diagnosis present

## 2020-05-28 DIAGNOSIS — Z8262 Family history of osteoporosis: Secondary | ICD-10-CM

## 2020-05-28 DIAGNOSIS — Z87891 Personal history of nicotine dependence: Secondary | ICD-10-CM

## 2020-05-28 DIAGNOSIS — J90 Pleural effusion, not elsewhere classified: Secondary | ICD-10-CM | POA: Diagnosis not present

## 2020-05-28 DIAGNOSIS — Z83438 Family history of other disorder of lipoprotein metabolism and other lipidemia: Secondary | ICD-10-CM

## 2020-05-28 DIAGNOSIS — Z811 Family history of alcohol abuse and dependence: Secondary | ICD-10-CM

## 2020-05-28 DIAGNOSIS — Z880 Allergy status to penicillin: Secondary | ICD-10-CM

## 2020-05-28 LAB — CBC WITH DIFFERENTIAL/PLATELET
Abs Immature Granulocytes: 0.05 10*3/uL (ref 0.00–0.07)
Abs Immature Granulocytes: 0.01 10*3/uL (ref 0.00–0.07)
Basophils Absolute: 0 10*3/uL (ref 0.0–0.1)
Basophils Absolute: 0 10*3/uL (ref 0.0–0.1)
Basophils Relative: 0 %
Basophils Relative: 0 %
Eosinophils Absolute: 0.1 10*3/uL (ref 0.0–0.5)
Eosinophils Absolute: 0 10*3/uL (ref 0.0–0.5)
Eosinophils Relative: 1 %
Eosinophils Relative: 0 %
HCT: 35.1 % — ABNORMAL LOW (ref 36.0–46.0)
HCT: 32.7 % — ABNORMAL LOW (ref 36.0–46.0)
Hemoglobin: 10.6 g/dL — ABNORMAL LOW (ref 12.0–15.0)
Hemoglobin: 10.2 g/dL — ABNORMAL LOW (ref 12.0–15.0)
Immature Granulocytes: 1 %
Immature Granulocytes: 0 %
Lymphocytes Relative: 9 %
Lymphocytes Relative: 3 %
Lymphs Abs: 0.8 10*3/uL (ref 0.7–4.0)
Lymphs Abs: 0.2 10*3/uL — ABNORMAL LOW (ref 0.7–4.0)
MCH: 26.9 pg (ref 26.0–34.0)
MCH: 26.9 pg (ref 26.0–34.0)
MCHC: 30.2 g/dL (ref 30.0–36.0)
MCHC: 31.2 g/dL (ref 30.0–36.0)
MCV: 89.1 fL (ref 80.0–100.0)
MCV: 86.3 fL (ref 80.0–100.0)
Monocytes Absolute: 0.3 10*3/uL (ref 0.1–1.0)
Monocytes Absolute: 0.3 10*3/uL (ref 0.1–1.0)
Monocytes Relative: 4 %
Monocytes Relative: 4 %
Neutro Abs: 7.7 10*3/uL (ref 1.7–7.7)
Neutro Abs: 7.4 10*3/uL (ref 1.7–7.7)
Neutrophils Relative %: 85 %
Neutrophils Relative %: 93 %
Platelets: 233 10*3/uL (ref 150–400)
Platelets: 220 10*3/uL (ref 150–400)
RBC: 3.94 MIL/uL (ref 3.87–5.11)
RBC: 3.79 MIL/uL — ABNORMAL LOW (ref 3.87–5.11)
RDW: 15.5 % (ref 11.5–15.5)
RDW: 15.4 % (ref 11.5–15.5)
WBC: 9 10*3/uL (ref 4.0–10.5)
WBC: 8 10*3/uL (ref 4.0–10.5)
nRBC: 0 % (ref 0.0–0.2)
nRBC: 0 % (ref 0.0–0.2)

## 2020-05-28 LAB — COMPREHENSIVE METABOLIC PANEL
ALT: 11 U/L (ref 0–44)
ALT: 12 U/L (ref 0–44)
AST: 22 U/L (ref 15–41)
AST: 25 U/L (ref 15–41)
Albumin: 3.9 g/dL (ref 3.5–5.0)
Albumin: 3.2 g/dL — ABNORMAL LOW (ref 3.5–5.0)
Alkaline Phosphatase: 77 U/L (ref 38–126)
Alkaline Phosphatase: 67 U/L (ref 38–126)
Anion gap: 12 (ref 5–15)
Anion gap: 11 (ref 5–15)
BUN: 14 mg/dL (ref 8–23)
BUN: 15 mg/dL (ref 8–23)
CO2: 25 mmol/L (ref 22–32)
CO2: 23 mmol/L (ref 22–32)
Calcium: 8.5 mg/dL — ABNORMAL LOW (ref 8.9–10.3)
Calcium: 8.5 mg/dL — ABNORMAL LOW (ref 8.9–10.3)
Chloride: 103 mmol/L (ref 98–111)
Chloride: 104 mmol/L (ref 98–111)
Creatinine, Ser: 1.05 mg/dL — ABNORMAL HIGH (ref 0.44–1.00)
Creatinine, Ser: 1.22 mg/dL — ABNORMAL HIGH (ref 0.44–1.00)
GFR, Estimated: 56 mL/min — ABNORMAL LOW (ref >60)
GFR, Estimated: 47 mL/min — ABNORMAL LOW (ref >60)
Glucose, Bld: 181 mg/dL — ABNORMAL HIGH (ref 70–99)
Glucose, Bld: 183 mg/dL — ABNORMAL HIGH (ref 70–99)
Potassium: 3.9 mmol/L (ref 3.5–5.1)
Potassium: 4.1 mmol/L (ref 3.5–5.1)
Sodium: 140 mmol/L (ref 135–145)
Sodium: 138 mmol/L (ref 135–145)
Total Bilirubin: 0.5 mg/dL (ref 0.3–1.2)
Total Bilirubin: 0.4 mg/dL (ref 0.3–1.2)
Total Protein: 8.3 g/dL — ABNORMAL HIGH (ref 6.5–8.1)
Total Protein: 7.3 g/dL (ref 6.5–8.1)

## 2020-05-28 LAB — LACTIC ACID, PLASMA
Lactic Acid, Venous: 3.3 mmol/L (ref 0.5–1.9)
Lactic Acid, Venous: 4.1 mmol/L (ref 0.5–1.9)
Lactic Acid, Venous: 2.9 mmol/L (ref 0.5–1.9)

## 2020-05-28 LAB — BRAIN NATRIURETIC PEPTIDE: B Natriuretic Peptide: 62.5 pg/mL (ref 0.0–100.0)

## 2020-05-28 LAB — URINALYSIS, COMPLETE (UACMP) WITH MICROSCOPIC
Bacteria, UA: NONE SEEN
Bilirubin Urine: NEGATIVE
Glucose, UA: 150 mg/dL — AB
Hgb urine dipstick: NEGATIVE
Ketones, ur: 5 mg/dL — AB
Nitrite: NEGATIVE
Protein, ur: NEGATIVE mg/dL
Specific Gravity, Urine: 1.016 (ref 1.005–1.030)
pH: 6 (ref 5.0–8.0)

## 2020-05-28 LAB — RESP PANEL BY RT-PCR (FLU A&B, COVID) ARPGX2
Influenza A by PCR: NEGATIVE
Influenza B by PCR: NEGATIVE
SARS Coronavirus 2 by RT PCR: NEGATIVE

## 2020-05-28 LAB — TROPONIN I (HIGH SENSITIVITY)
Troponin I (High Sensitivity): 134 ng/L (ref <18)
Troponin I (High Sensitivity): 207 ng/L (ref <18)

## 2020-05-28 LAB — MRSA PCR SCREENING: MRSA by PCR: NEGATIVE

## 2020-05-28 LAB — GLUCOSE, CAPILLARY: Glucose-Capillary: 138 mg/dL — ABNORMAL HIGH (ref 70–99)

## 2020-05-28 LAB — MAGNESIUM: Magnesium: 1.3 mg/dL — ABNORMAL LOW (ref 1.7–2.4)

## 2020-05-28 LAB — D-DIMER, QUANTITATIVE: D-Dimer, Quant: 1.94 ug/mL-FEU — ABNORMAL HIGH (ref 0.00–0.50)

## 2020-05-28 MED ORDER — SODIUM CHLORIDE 0.9 % IV BOLUS
1000.0000 mL | Freq: Once | INTRAVENOUS | Status: AC
  Administered 2020-05-28: 1000 mL via INTRAVENOUS

## 2020-05-28 MED ORDER — FERROUS SULFATE 325 (65 FE) MG PO TABS
325.0000 mg | ORAL_TABLET | Freq: Every day | ORAL | Status: DC
  Administered 2020-05-29 – 2020-05-31 (×3): 325 mg via ORAL
  Filled 2020-05-28 (×3): qty 1

## 2020-05-28 MED ORDER — SODIUM CHLORIDE 0.9 % IV SOLN
250.0000 mL | INTRAVENOUS | Status: DC
  Administered 2020-05-28: 250 mL via INTRAVENOUS

## 2020-05-28 MED ORDER — ATORVASTATIN CALCIUM 20 MG PO TABS
10.0000 mg | ORAL_TABLET | Freq: Every day | ORAL | Status: DC
  Administered 2020-05-29 – 2020-05-31 (×3): 10 mg via ORAL
  Filled 2020-05-28 (×3): qty 1

## 2020-05-28 MED ORDER — LORAZEPAM 2 MG/ML IJ SOLN: 0.2500 mg | Freq: Two times a day (BID) | INTRAMUSCULAR | Status: DC | PRN

## 2020-05-28 MED ORDER — MAGNESIUM SULFATE 4 GM/100ML IV SOLN
4.0000 g | INTRAVENOUS | Status: AC
  Administered 2020-05-28: 4 g via INTRAVENOUS
  Filled 2020-05-28: qty 100

## 2020-05-28 MED ORDER — ORAL CARE MOUTH RINSE
15.0000 mL | Freq: Two times a day (BID) | OROMUCOSAL | Status: DC
  Administered 2020-05-28 – 2020-05-29 (×2): 15 mL via OROMUCOSAL

## 2020-05-28 MED ORDER — PANTOPRAZOLE SODIUM 40 MG PO TBEC: 40.0000 mg | DELAYED_RELEASE_TABLET | Freq: Every day | ORAL | Status: DC

## 2020-05-28 MED ORDER — ROPINIROLE HCL 1 MG PO TABS
3.0000 mg | ORAL_TABLET | Freq: Every day | ORAL | Status: DC
  Administered 2020-05-29 – 2020-05-30 (×2): 3 mg via ORAL
  Filled 2020-05-28 (×4): qty 3

## 2020-05-28 MED ORDER — IOHEXOL 350 MG/ML SOLN
60.0000 mL | Freq: Once | INTRAVENOUS | Status: AC | PRN
  Administered 2020-05-28: 60 mL via INTRAVENOUS

## 2020-05-28 MED ORDER — NOREPINEPHRINE 4 MG/250ML-% IV SOLN
2.0000 ug/min | INTRAVENOUS | Status: DC
  Administered 2020-05-28: 2 ug/min via INTRAVENOUS
  Administered 2020-05-29: 4 ug/min via INTRAVENOUS
  Administered 2020-05-29: 5 ug/min via INTRAVENOUS
  Filled 2020-05-28 (×2): qty 250

## 2020-05-28 MED ORDER — ROPINIROLE HCL 1 MG PO TABS: 1.0000 mg | ORAL_TABLET | ORAL | Status: DC

## 2020-05-28 MED ORDER — LEVOFLOXACIN IN D5W 750 MG/150ML IV SOLN: 750.0000 mg | INTRAVENOUS | Status: DC

## 2020-05-28 MED ORDER — SUVOREXANT 20 MG PO TABS: 1.0000 | ORAL_TABLET | Freq: Every day | ORAL | Status: DC

## 2020-05-28 MED ORDER — LEVOFLOXACIN IN D5W 750 MG/150ML IV SOLN: 750.0000 mg | Freq: Once | INTRAVENOUS | Status: DC

## 2020-05-28 MED ORDER — B COMPLEX-C PO TABS
1.0000 | ORAL_TABLET | Freq: Every day | ORAL | Status: DC
  Administered 2020-05-29 – 2020-05-31 (×3): 1 via ORAL
  Filled 2020-05-28 (×3): qty 1

## 2020-05-28 MED ORDER — SODIUM CHLORIDE 0.9 % IV SOLN
500.0000 mg | INTRAVENOUS | Status: DC
  Administered 2020-05-28 – 2020-05-30 (×2): 500 mg via INTRAVENOUS
  Filled 2020-05-28 (×4): qty 500

## 2020-05-28 MED ORDER — ROPINIROLE HCL 1 MG PO TABS
0.5000 mg | ORAL_TABLET | Freq: Every day | ORAL | Status: DC
  Administered 2020-05-29 – 2020-05-31 (×3): 0.5 mg via ORAL
  Filled 2020-05-28: qty 1
  Filled 2020-05-28: qty 2
  Filled 2020-05-28: qty 1

## 2020-05-28 MED ORDER — ALPRAZOLAM 0.5 MG PO TABS
0.5000 mg | ORAL_TABLET | Freq: Two times a day (BID) | ORAL | Status: DC | PRN
  Administered 2020-05-29 – 2020-05-30 (×3): 0.5 mg via ORAL
  Filled 2020-05-28 (×3): qty 1

## 2020-05-28 MED ORDER — ACETAMINOPHEN 650 MG RE SUPP
650.0000 mg | Freq: Once | RECTAL | Status: AC
  Administered 2020-05-28: 650 mg via RECTAL
  Filled 2020-05-28: qty 1

## 2020-05-28 MED ORDER — SITAGLIP PHOS-METFORMIN HCL ER 50-1000 MG PO TB24: 1.0000 | ORAL_TABLET | Freq: Two times a day (BID) | ORAL | Status: DC

## 2020-05-28 MED ORDER — SODIUM CHLORIDE 0.9 % IV SOLN
1.0000 g | INTRAVENOUS | Status: DC
  Administered 2020-05-28 – 2020-05-30 (×2): 1 g via INTRAVENOUS
  Filled 2020-05-28 (×4): qty 10

## 2020-05-28 MED ORDER — ONDANSETRON HCL 4 MG/2ML IJ SOLN: 4.0000 mg | Freq: Four times a day (QID) | INTRAMUSCULAR | Status: DC | PRN

## 2020-05-28 MED ORDER — ENOXAPARIN SODIUM 60 MG/0.6ML ~~LOC~~ SOLN
50.0000 mg | SUBCUTANEOUS | Status: DC
  Administered 2020-05-28 – 2020-05-30 (×3): 50 mg via SUBCUTANEOUS
  Filled 2020-05-28 (×4): qty 0.6

## 2020-05-28 MED ORDER — IPRATROPIUM-ALBUTEROL 0.5-2.5 (3) MG/3ML IN SOLN
3.0000 mL | Freq: Four times a day (QID) | RESPIRATORY_TRACT | Status: DC
  Administered 2020-05-28 – 2020-05-29 (×2): 3 mL via RESPIRATORY_TRACT
  Filled 2020-05-28 (×2): qty 3

## 2020-05-28 MED ORDER — CHLORHEXIDINE GLUCONATE CLOTH 2 % EX PADS
6.0000 | MEDICATED_PAD | Freq: Every day | CUTANEOUS | Status: DC
  Administered 2020-05-30: 10:00:00 6 via TOPICAL

## 2020-05-28 MED ORDER — TRAZODONE HCL 50 MG PO TABS: 50.0000 mg | ORAL_TABLET | Freq: Every evening | ORAL | Status: DC | PRN

## 2020-05-28 MED ORDER — SERTRALINE HCL 50 MG PO TABS
200.0000 mg | ORAL_TABLET | Freq: Every morning | ORAL | Status: DC
  Administered 2020-05-29 – 2020-05-31 (×3): 200 mg via ORAL
  Filled 2020-05-28 (×3): qty 4

## 2020-05-28 MED ORDER — GLIPIZIDE 5 MG PO TABS: 5.0000 mg | ORAL_TABLET | Freq: Two times a day (BID) | ORAL | Status: DC

## 2020-05-28 MED ORDER — METHOCARBAMOL 1000 MG/10ML IJ SOLN
500.0000 mg | Freq: Four times a day (QID) | INTRAVENOUS | Status: DC | PRN
  Filled 2020-05-28: qty 5

## 2020-05-28 MED ORDER — VITAMIN D 25 MCG (1000 UNIT) PO TABS
2000.0000 [IU] | ORAL_TABLET | Freq: Every day | ORAL | Status: DC
Start: 2020-05-29 — End: 2020-05-31
  Administered 2020-05-29 – 2020-05-31 (×3): 2000 [IU] via ORAL
  Filled 2020-05-28 (×3): qty 2

## 2020-05-28 MED ORDER — PREGABALIN 75 MG PO CAPS
75.0000 mg | ORAL_CAPSULE | Freq: Every day | ORAL | Status: DC
  Administered 2020-05-29 – 2020-05-30 (×2): 75 mg via ORAL
  Filled 2020-05-28 (×3): qty 1

## 2020-05-28 MED ORDER — ROPINIROLE HCL 1 MG PO TABS
3.0000 mg | ORAL_TABLET | Freq: Every day | ORAL | Status: DC
  Filled 2020-05-28: qty 3

## 2020-05-28 MED ORDER — METRONIDAZOLE IN NACL 5-0.79 MG/ML-% IV SOLN
500.0000 mg | Freq: Once | INTRAVENOUS | Status: AC
  Administered 2020-05-28: 500 mg via INTRAVENOUS
  Filled 2020-05-28: qty 100

## 2020-05-28 MED ORDER — ACETAMINOPHEN 650 MG RE SUPP: 650.0000 mg | Freq: Four times a day (QID) | RECTAL | Status: DC | PRN

## 2020-05-28 MED ORDER — BUSPIRONE HCL 15 MG PO TABS
15.0000 mg | ORAL_TABLET | Freq: Two times a day (BID) | ORAL | Status: DC
  Administered 2020-05-29 – 2020-05-31 (×5): 15 mg via ORAL
  Filled 2020-05-28: qty 1
  Filled 2020-05-28: qty 3
  Filled 2020-05-28: qty 1
  Filled 2020-05-28: qty 3
  Filled 2020-05-28 (×2): qty 1

## 2020-05-28 MED ORDER — ACETAMINOPHEN 325 MG PO TABS: 650.0000 mg | ORAL_TABLET | Freq: Four times a day (QID) | ORAL | Status: DC | PRN

## 2020-05-28 MED ORDER — TRAZODONE HCL 50 MG PO TABS: 25.0000 mg | ORAL_TABLET | Freq: Every evening | ORAL | Status: DC | PRN

## 2020-05-28 MED ORDER — SODIUM CHLORIDE 0.9 % IV SOLN: INTRAVENOUS | Status: DC

## 2020-05-28 MED ORDER — MAGNESIUM HYDROXIDE 400 MG/5ML PO SUSP
30.0000 mL | Freq: Every day | ORAL | Status: DC | PRN
  Filled 2020-05-28: qty 30

## 2020-05-28 MED ORDER — INSULIN ASPART 100 UNIT/ML ~~LOC~~ SOLN
0.0000 [IU] | Freq: Three times a day (TID) | SUBCUTANEOUS | Status: DC
  Administered 2020-05-28 – 2020-05-30 (×3): 2 [IU] via SUBCUTANEOUS
  Filled 2020-05-28 (×3): qty 1

## 2020-05-28 MED ORDER — PANTOPRAZOLE SODIUM 40 MG IV SOLR
40.0000 mg | INTRAVENOUS | Status: DC
  Administered 2020-05-28 – 2020-05-29 (×2): 40 mg via INTRAVENOUS
  Filled 2020-05-28 (×2): qty 40

## 2020-05-28 MED ORDER — ONDANSETRON HCL 4 MG PO TABS: 4.0000 mg | ORAL_TABLET | Freq: Four times a day (QID) | ORAL | Status: DC | PRN

## 2020-05-28 MED ORDER — AMLODIPINE BESYLATE 5 MG PO TABS: 5.0000 mg | ORAL_TABLET | Freq: Every day | ORAL | Status: DC
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
## 2020-05-28 NOTE — Assessment & Plan Note (Addendum)
#   Left breast cancer [2017] stage III  TRIPLE NEGATIVE-  s/p neoadjuvant chemotherapy-currently under surveillance. Feb 2022- Mammo- WNL.  Stable.  # Continue close surveillance evidence of recurrence; will switch over to annual surveillance starting next visit.    # Mild anemia -hemoglobin 10-11 for the last 10 years; stable.  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  Defer to GI regarding follow-up.  # DISPOSITION: # follow up in 6 m with MD/labs-cbc/cmp/ca-27-29; reclast- -Dr.B

## 2020-05-28 NOTE — ED Provider Notes (Signed)
[facility] Emergency Department Provider Note  ____________________________________________   Event Date/Time   First MD Initiated Contact with Patient 05/28/20 1652     (approximate)  I have reviewed the triage vital signs and the nursing notes.   HISTORY  Chief Complaint Shortness of Breath  History is limited by the patient as she is presenting with some AMS secondary to code sepsis.  History is provided by the patient's neighbor.  HPI Suzanne Strong is a 75 y.o. 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  Patient was with her neighbor, make an eye doctor appointment today.  Patient apparently got weak and had some subjective and confirmed fevers at the doctor's office.  She apparently fell without subsequent head injury, and presents to the ED now for evaluation of symptoms.  She presented to the ED and was found to be tachycardic, tachypneic and hypoxic at 82% on room air.  Patient was admitted immediately worked up with bedside evaluation, and stat labs for sepsis protocol.  She was found to be alert and oriented person and place, but was not able to answer questions clearly as she was unsure as to where she had been today and specifically what occurred.  Patient is currently under the care of her oncologist and had blood work done this morning, when she was last known to be well.  Patient assumed she was having some weakness secondary to blood draw she received this morning for routine labs.  The patient's friend also relates that the patient has significant dysphagia and xerostomia secondary to her cancer treatment.  She had a previous aspiration pneumonia, and according to the patient had a similar presentation including fever, "talking out of her head", and weakness.  Past Medical History:  Diagnosis Date  . Anemia    iron deficiency and B12  deficiency  . Anxiety   . Breast cancer ([place]) 02/25/2015   upper-outer quadrant of left female breast, Triple Negative. Neo-adjuvant chemo with complete pathologic response.   . Cervical dysplasia    conization  . Diabetes mellitus without complication ([place])   . Diverticulosis   . Fever 04/11/2015  . Gastroparesis    related to previous radiation tx  . GERD (gastroesophageal reflux disease)   . Hyperlipidemia   . Hypertension   . Neuropathy    legs/feet, S/P chemo meds  . Personal history of chemotherapy   . Personal history of radiation therapy 2017   LEFT lumpectomy w/ radiation  . RLS (restless legs syndrome)   . Shingles    Hx of  . 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 Wears dentures    partial bottom    Patient Active Problem List   Diagnosis Date Noted  . CAP (community acquired pneumonia) 05/28/2020  . Dysphagia   . Gastric erythema   . Gastric polyp   . Encounter for screening colonoscopy   . Polyp of colon   . Aspiration pneumonia ([place]) 02/15/2020  . Diabetes mellitus without complication ([place])   . GERD (gastroesophageal reflux disease)   . Hypertension   . Elevated troponin   . Urinary urgency 01/05/2020  . Grief reaction 09/18/2019  . Swallowing dysfunction 07/18/2019  . Fever, intermittent 09/01/2018  . Pre-syncope 02/07/2018  . Orthostatic hypotension 01/26/2018  . Episodic weakness 01/26/2018  . Medicare annual wellness visit, subsequent 06/29/2017  . 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  Preoperative examination 04/27/2017  . Carpal tunnel syndrome of right wrist 11/13/2016  . 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[place]) History of  02/17/2016  . Obstructive sleep apnea 01/01/2016  . Carcinoma of upper-outer quadrant of left breast in female, estrogen receptor negative ([place]) 11/21/2015  . Hypomagnesemia 08/28/2015  . Initial Medicare annual wellness visit 10/16/2014  . Estrogen deficiency 10/16/2014  . Colon cancer screening 10/16/2014  . Controlled type 2  diabetes mellitus without complication ([place]) 79/89/2119  . RLS (restless legs syndrome) 02/21/2014  . Chronic neck pain 07/25/2010  . Depression with anxiety 07/07/2010  . Routine general medical examination at a health care facility 06/26/2010  . B12 deficiency 09/04/2009  . Anemia, iron deficiency 08/29/2009  . Anxiety state 08/26/2009  . GASTROPARESIS 08/26/2009  . Vitamin D deficiency 07/19/2008  . Disorder of bone and cartilage 07/19/2007  . Hyperlipidemia 07/18/2007  . EDEMA 07/18/2007  . Skin lesion, superficial 06/02/2007    Past Surgical History:  Procedure Laterality Date  . APPENDECTOMY    . BREAST BIOPSY Left 02/25/2015   INVASIVE MAMMARY CARCINOMA,Triple negative.  [name] BREAST CYST ASPIRATION    . BREAST EXCISIONAL BIOPSY Left 12/2015   surgery  . BREAST LUMPECTOMY WITH NEEDLE LOCALIZATION Left 10/02/2015   Procedure: BREAST LUMPECTOMY WITH NEEDLE LOCALIZATION;  Surgeon: [name], MD;  Location: [facility];  Service: General;  Laterality: Left;  . BREAST LUMPECTOMY WITH SENTINEL LYMPH NODE BIOPSY Left 10/02/2015   Procedure: LEFT BREAST WIDE EXCISION WITH SENTINEL LYMPH NODE BX;  Surgeon: [name], MD;  Location: [facility];  Service: General;  Laterality: Left;  . BREAST SURGERY     breast biopsy benign  . CARPAL TUNNEL RELEASE Right 05/19/2017   Procedure: CARPAL TUNNEL RELEASE;  Surgeon: Earnestine Leys, MD;  Location: [facility];  Service: Orthopedics;  Laterality: Right;  . CATARACT EXTRACTION W/PHACO Right 10/26/2018   Procedure: CATARACT EXTRACTION PHACO AND INTRAOCULAR LENS PLACEMENT ([place]) right diabetic;  Surgeon: Leandrew Koyanagi, MD;  Location: [place];  Service: Ophthalmology;  Laterality: Right;  Diabetic - oral meds cancer center been notified and will come  . CATARACT EXTRACTION W/PHACO Left 11/16/2018   Procedure: CATARACT EXTRACTION PHACO AND INTRAOCULAR LENS PLACEMENT ([facility]) LEFT DIABETIC  01:01.0  17.0%  10.37;  Surgeon: Leandrew Koyanagi, MD;  Location: [place];  Service: Ophthalmology;  Laterality: Left;  Diabetic - oral meds Port-a-cath  . CHOLECYSTECTOMY    . Cologuard  07/22/2016   Negative  . COLONOSCOPY WITH PROPOFOL N/A 05/24/2020   Procedure: COLONOSCOPY WITH PROPOFOL;  Surgeon: Virgel Manifold, MD;  Location: [facility];  Service: Endoscopy;  Laterality: N/A;  . ESOPHAGOGASTRODUODENOSCOPY  07/2009   normal with few gastric polyps  . ESOPHAGOGASTRODUODENOSCOPY (EGD) WITH PROPOFOL N/A 05/24/2020   Procedure: ESOPHAGOGASTRODUODENOSCOPY (EGD) WITH PROPOFOL;  Surgeon: Virgel Manifold, MD;  Location: [facility];  Service: Endoscopy;  Laterality: N/A;  . MOLE REMOVAL    . PORT-A-CATH REMOVAL    . PORTACATH PLACEMENT    . PORTACATH PLACEMENT Right 03/12/2015   Procedure: INSERTION PORT-A-CATH;  Surgeon: [name], MD;  Location: [facility];  Service: General;  Laterality: Right;  . RADICAL NECK DISSECTION    . TONSILLECTOMY     cancer treated with chemo and radiation  . TRIGGER FINGER RELEASE Right 05/19/2017   Procedure: RELEASE TRIGGER FINGER/A-1 PULLEY ring finger;  Surgeon: Earnestine Leys, MD;  Location: [facility];  Service: Orthopedics;  Laterality: Right;    Prior to Admission medications   Medication  Sig Start Date End Date Taking? Authorizing Provider  albuterol (VENTOLIN HFA) 108 (90 Base) MCG/ACT inhaler INHALE TWO PUFFS BY MOUTH EVERY SIX HOURS AS NEEDED 01/09/20   Tower, [name], MD  ALPRAZolam Duanne Moron) 0.5 MG tablet TAKE ONE TABLET BY MOUTH twice daily AS NEEDED FOR ANXIETY OR SLEEP 05/09/20   Thayer Headings, PMHNP  amitriptyline (ELAVIL) 50 MG tablet Take 1 tablet (50 mg total) by mouth at bedtime. 12/07/16   Cammie Sickle, MD  amLODipine (NORVASC) 5 MG tablet Take 1 tablet (5 mg total) by mouth daily. 05/15/20   Tower, [name], MD  atorvastatin (LIPITOR) 10 MG tablet TAKE 1 TABLET(10 MG) BY MOUTH DAILY 06/02/19   Tower, [name], MD  b complex vitamins tablet Take  1 tablet by mouth daily.    [provider]  busPIRone (BUSPAR) 15 MG tablet Take 1 tablet (15 mg total) by mouth 2 (two) times daily. 02/07/20 03/08/20  Thayer Headings, PMHNP  Cholecalciferol (VITAMIN D-1000 MAX ST) 25 MCG (1000 UT) tablet Take 2,000 Units by mouth daily. Takes three 50 mcg tablets, 3 capsules daily    [provider]  ferrous sulfate 325 (65 FE) MG tablet Take 325 mg by mouth daily with breakfast.    [provider]  glipiZIDE (GLUCOTROL) 5 MG tablet Take 5 mg by mouth 2 (two) times daily. 03/22/20   [provider]  glucose blood test strip To check blood glucose once daily and prn for diabetes 10/20/19   Tower, [name], MD  JANUMET XR 50-1000 MG TB24 Take 1 tablet by mouth 2 (two) times daily.  03/13/15   [provider]  lidocaine-prilocaine (EMLA) cream Apply 1 application topically as needed. Apply to port when going through Chemo    [provider]  meloxicam (MOBIC) 7.5 MG tablet Take 7.5 mg by mouth daily. Patient not taking: Reported on 05/28/2020 05/02/20   [provider]  Multiple Vitamins-Minerals (WOMENS MULTIVITAMIN PO) Take by mouth.    [provider]  Na Sulfate-K Sulfate-Mg Sulf 17.5-3.13-1.6 GM/177ML SOLN At 5 PM the day before procedure take 1 bottle and 5 hours before procedure take 1 bottle. Patient not taking: Reported on 05/28/2020 04/17/20   Virgel Manifold, MD  omeprazole (PRILOSEC) 20 MG capsule TAKE 1 CAPSULE(20 MG) BY MOUTH AT BEDTIME 06/02/19   Tower, [name], MD  pregabalin (LYRICA) 75 MG capsule Take 1 capsule (75 mg total) by mouth daily. 02/17/20 04/17/20  Shelly Coss, MD  rOPINIRole (REQUIP) 1 MG tablet TAKE 1/2 TABLET BY MOUTH EVERY MORNING and TAKE THREE TABLETS BY MOUTH EVERYDAY AT BEDTIME 12/21/19   Tower, [name], MD  sertraline (ZOLOFT) 100 MG tablet TAKE TWO TABLETS BY MOUTH EVERY MORNING 05/09/20   Thayer Headings, PMHNP  Suvorexant (BELSOMRA) 20 MG TABS Take 1 tablet by  mouth at bedtime. 05/03/20   Thayer Headings, PMHNP  traZODone (DESYREL) 50 MG tablet Take 1/2-2 tabs po QHS prn 05/03/20   Thayer Headings, PMHNP  traZODone (DESYREL) 50 MG tablet Take 1/2-2 tabs po QHS prn insomnia 05/09/20   Thayer Headings, PMHNP    Allergies Amoxicillin, Penicillin g, Fluconazole, Difuleron [ferrous fumarate-dss], and Other  Family History  Problem Relation Age of Onset  . Osteoporosis Mother   . Hyperlipidemia Mother   . Depression Mother   . Cancer Mother        skin cancer ? basal cell and lung ca heavy smoker  . Alcohol abuse Father   . Cancer Father  skin CA ? basal cell  . Heart disease Father        CHF  . Hyperlipidemia Brother   . Hypertension Brother   . Breast cancer Neg Hx     Social History Social History   Tobacco Use  . Smoking status: Former Smoker    Packs/day: 0.50    Years: 6.00    Pack years: 3.00    Types: Cigarettes    Quit date: 03/10/1984    Years since quitting: 36.2  . Smokeless tobacco: Never Used  Vaping Use  . Vaping Use: Never used  Substance Use Topics  . Alcohol use: No    Alcohol/week: 0.0 standard drinks  . Drug use: No    Review of Systems  Constitutional: Subjective fever & shaking chills Eyes: No visual changes. ENT: No sore throat. Cardiovascular: Denies chest pain. Respiratory: Denies shortness of breath. Gastrointestinal: No abdominal pain.  No nausea, no vomiting.  No diarrhea.  No constipation. Genitourinary: Negative for dysuria. Musculoskeletal: Negative for back pain. Skin: Negative for rash. Neurological: Negative for headaches, focal weakness or numbness. ____________________________________________   PHYSICAL EXAM:  VITAL SIGNS: ED Triage Vitals  Enc Vitals Group     BP 05/28/20 1648 (!) 129/51     Pulse Rate 05/28/20 1645 (!) 109     Resp 05/28/20 1645 20     Temp 05/28/20 1648 99.5 F (37.5 C)     Temp Source 05/28/20 1645 Oral     SpO2 05/28/20 1639 (!) 82 %     Weight  05/28/20 1646 222 lb (100.7 kg)     Height 05/28/20 1646 5\' 6"  (1.676 m)     Head Circumference --      Peak Flow --      Pain Score 05/28/20 1645 0     Pain Loc --      Pain Edu? --      Excl. in [place]? --    Constitutional: Alert and oriented. Well appearing and in no acute distress. Eyes: Conjunctivae are normal. PERRL. EOMI. Head: Atraumatic. Nose: No congestion/rhinnorhea. Mouth/Throat: Mucous membranes are moist.  Oropharynx non-erythematous. Neck: No stridor.   Cardiovascular: Normal rate, regular rhythm. Grossly normal heart sounds.  Good peripheral circulation. Respiratory: Normal respiratory effort.  No retractions. Lungs CTAB. Gastrointestinal: Soft and nontender. No distention. No abdominal bruits. No CVA tenderness. Musculoskeletal: No lower extremity tenderness nor edema.  No joint effusions. Neurologic:  Normal speech and language. No gross focal neurologic deficits are appreciated. No gait instability. Skin:  Skin is warm, dry and intact. No rash noted. Psychiatric: Mood and affect are normal. Speech and behavior are normal.  ____________________________________________   LABS (all labs ordered are listed, but only abnormal results are displayed)  Labs Reviewed  CBC WITH DIFFERENTIAL/PLATELET - Abnormal; Notable for the following components:      Result Value   RBC 3.79 (*)    Hemoglobin 10.2 (*)    HCT 32.7 (*)    Lymphs Abs 0.2 (*)    All other components within normal limits  URINALYSIS, COMPLETE (UACMP) WITH MICROSCOPIC - Abnormal; Notable for the following components:   Color, Urine YELLOW (*)    APPearance HAZY (*)    Glucose, UA 150 (*)    Ketones, ur 5 (*)    Leukocytes,Ua TRACE (*)    Crystals PRESENT (*)    All other components within normal limits  COMPREHENSIVE METABOLIC PANEL - Abnormal; Notable for the following components:   Glucose, Bld 183 (*)  Creatinine, Ser 1.22 (*)    Calcium 8.5 (*)    Albumin 3.2 (*)    GFR, Estimated 47 (*)     All other components within normal limits  LACTIC ACID, PLASMA - Abnormal; Notable for the following components:   Lactic Acid, Venous 4.1 (*)    All other components within normal limits  LACTIC ACID, PLASMA - Abnormal; Notable for the following components:   Lactic Acid, Venous 3.3 (*)    All other components within normal limits  D-DIMER, QUANTITATIVE - Abnormal; Notable for the following components:   D-Dimer, Quant 1.94 (*)    All other components within normal limits  MAGNESIUM - Abnormal; Notable for the following components:   Magnesium 1.3 (*)    All other components within normal limits  TROPONIN I (HIGH SENSITIVITY) - Abnormal; Notable for the following components:   Troponin I (High Sensitivity) 134 (*)    All other components within normal limits  TROPONIN I (HIGH SENSITIVITY) - Abnormal; Notable for the following components:   Troponin I (High Sensitivity) 207 (*)    All other components within normal limits  RESP PANEL BY RT-PCR (FLU A&B, COVID) ARPGX2  CULTURE, BLOOD (ROUTINE X 2)  CULTURE, BLOOD (ROUTINE X 2)  EXPECTORATED SPUTUM ASSESSMENT W GRAM STAIN, RFLX TO RESP C  BRAIN NATRIURETIC PEPTIDE  BASIC METABOLIC PANEL  CBC  HIV ANTIBODY (ROUTINE TESTING W REFLEX)  LEGIONELLA PNEUMOPHILA SEROGP 1 UR AG  STREP PNEUMONIAE URINARY ANTIGEN   ____________________________________________  EKG  ____________________________________________  RADIOLOGY I, Melvenia Needles, personally viewed and evaluated these images (plain radiographs) as part of my medical decision making, as well as reviewing the written report by the radiologist.  ED MD interpretation:  Agree with report  Official radiology report(s): CT Angio Chest PE W and/or Wo Contrast  Result Date: 05/28/2020 CLINICAL DATA:  Acute shortness of breath, elevated D-dimer, history of left breast cancer EXAM: CT ANGIOGRAPHY CHEST WITH CONTRAST TECHNIQUE: Multidetector CT imaging of the chest was performed  using the standard protocol during bolus administration of intravenous contrast. Multiplanar CT image reconstructions and MIPs were obtained to evaluate the vascular anatomy. CONTRAST:  15mL OMNIPAQUE IOHEXOL 350 MG/ML SOLN COMPARISON:  05/28/2020 FINDINGS: Cardiovascular: This is a technically adequate evaluation of the pulmonary vasculature. No filling defects or pulmonary emboli. The heart is unremarkable without pericardial effusion. No evidence of thoracic aortic aneurysm or dissection. Moderate atherosclerosis of the aortic arch and descending thoracic aorta. Mediastinum/Nodes: Mediastinal and bilateral hilar adenopathy are seen. Subcarinal adenopathy and precarinal adenopathy measure up to 12 mm in short axis. Thyroid, trachea, and esophagus are unremarkable. Lungs/Pleura: There is bibasilar airspace disease greatest within the lower lobes. Trace left pleural fluid. No pneumothorax. Central airways are patent. Upper Abdomen: No acute abnormality. Musculoskeletal: No acute or destructive bony lesions. Reconstructed images demonstrate no additional findings. Review of the MIP images confirms the above findings. IMPRESSION: 1. No evidence of pulmonary embolus. 2. Bibasilar airspace disease and trace left pleural effusion, favor pneumonia over edema. 3. Mediastinal and bilateral hilar adenopathy, likely reactive. 4.  Aortic Atherosclerosis (ICD10-I70.0). Electronically Signed   By: Randa Ngo M.D.   On: 05/28/2020 18:51   DG Chest Portable 1 View  Result Date: 05/28/2020 CLINICAL DATA:  Shortness of breath EXAM: PORTABLE CHEST 1 VIEW COMPARISON:  02/14/2020 FINDINGS: Cardiomegaly with vascular congestion and hazy pulmonary airspace disease, possible edema. More patchy confluent airspace disease at the bases. No pleural effusion or pneumothorax. IMPRESSION: Cardiomegaly with vascular congestion  and hazy pulmonary airspace disease, possible edema. Patchy confluent airspace disease at the bases may reflect  atelectasis or pneumonia. Electronically Signed   By: Donavan Foil M.D.   On: 05/28/2020 18:09  ____________________________________________   PROCEDURES  Procedure(s) performed (including Critical Care):  Procedures NS 1000 ml bolus x 2 Metronidazole 500 gm IVPB  ____________________________________________   INITIAL IMPRESSION / ASSESSMENT AND PLAN / ED COURSE  As part of my medical decision making, I reviewed the following data within the [place] History obtained from family, Labs reviewed elevated trop, lactic, dimer. Notes from prior ED visits and [place] Controlled Substance Database  Differential diagnosis includes, but is not limited to, alcohol, illicit or prescription medications, or other toxic ingestion; intracranial pathology such as stroke or intracerebral hemorrhage; fever or infectious causes including sepsis; hypoxemia and/or hypercarbia; uremia; trauma; endocrine related disorders such as diabetes, hypoglycemia, and thyroid-related diseases; hypertensive encephalopathy; etc.  ----------------------------------------- 8:04 PM on 05/28/2020 ----------------------------------------- S/w Dr. [name] Ace: he will evaluate the patient and admit.  Patient ED evaluation and management of altered mental status, chills, and near syncope.  She was evaluated for her symptoms, and found to meet sepsis protocol on initial evaluation.  Patient was found to be tachycardic, tachypneic, and hypotensive.  Labs showed an elevated D-dimer, troponin, and lactic.  She was evaluated with Belenda Cruise CT imaging which revealed a bilateral pneumonia without signs of PE.  Patient started on empiric antibiotics in the ED and fluid hydration.  Patient is a agreeable to the plan of admission at this time for continued IV antibiotic management of her bilateral pneumonia.  ____________________________________________   FINAL CLINICAL IMPRESSION(S) / ED DIAGNOSES  Final diagnoses:   Aspiration pneumonia, unspecified aspiration pneumonia type, unspecified laterality, unspecified part of lung [facility])     ED Discharge Orders    None      *Please note:  Suzanne Strong was evaluated in Emergency Department on 05/28/2020 for the symptoms described in the history of present illness. She was evaluated in the context of the global COVID-19 pandemic, which necessitated consideration that the patient might be at risk for infection with the SARS-CoV-2 virus that causes COVID-19. Institutional protocols and algorithms that pertain to the evaluation of patients at risk for COVID-19 are in a state of rapid change based on information released by regulatory bodies including the CDC and federal and state organizations. These policies and algorithms were followed during the patient's care in the ED.  Some ED evaluations and interventions may be delayed as a result of limited staffing during and the pandemic.*   Note:  This document was prepared using Dragon voice recognition software and may include unintentional dictation errors.    Melvenia Needles, PA-C 05/28/20 2009    Naaman Plummer, MD 05/28/20 2213

## 2020-05-28 NOTE — Progress Notes (Signed)
Patient here for oncology follow-up appointment, expresses  concerns of chronic neuropathy

## 2020-05-28 NOTE — Progress Notes (Signed)
[facility] OFFICE PROGRESS NOTE  Patient Care Team: Tower, [name], MD as PCP - General Byrnett, [name], MD (General Surgery) [name], MD (Inactive) (Oncology) Alda Berthold, DO as Consulting Physician (Neurology) Cammie Sickle, MD as Consulting Physician (Oncology) [name], [facility] (Pharmacist) Noreene Filbert, MD as Radiation Oncologist (Radiation Oncology)  Cancer Staging No matching staging information was found for the patient.   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  5 CM TUMOR MASS BIOPSIES POSITIVE FOR INVASIVE MAMMARY CARCINOMA TRIPLE NEGATIVE DISEASE(jANUARY, 2017)stage IIIa. ER/PR/her2 Neu-NEGATIVE  # Cytoxan and Adriamycin from March 18, 2015; carbo-taxol x4 cycles  # AUg 18th s/p Lumpec & SLNBx- Complete path CR [ypT0 ypsn0] s/p RT [oct 2017; Dr.Crystal]  3.  MUGA scan of the heart shows ejection fraction to be 61% (January, 2017)   EGD/colonoscopy- Suzanne Strong 2022]-  ------------------------------------------------------------    DIAGNOSIS: BREAST CANCER  STAGE:  III       ;GOALS: curative  CURRENT/MOST RECENT THERAPY: surveillaince.     Carcinoma of upper-outer quadrant of left breast in female, estrogen receptor negative ([place])  11/21/2015 Initial Diagnosis   Carcinoma of upper-outer quadrant of left breast in female, estrogen receptor negative ([place])     INTERVAL HISTORY:  Suzanne Strong 75 y.o.  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  In the interim patient also underwent EGD/colonoscopy.  Patient admits to good appetite.  No weight loss.  No nausea no vomiting pain no headache.  Continues to  complain of chronic tingling and numbness in activities.  Is not getting any better.  She continues to be on Lyrica/ Amitriptyline.   Review of Systems  Constitutional: Negative for chills, diaphoresis, fever, malaise/fatigue and weight loss.  HENT: Negative for nosebleeds and sore throat.   Eyes: Negative for double vision.  Respiratory: Negative for hemoptysis, shortness of breath and wheezing.   Cardiovascular: Negative for chest pain, palpitations, orthopnea and leg swelling.  Gastrointestinal: Negative for abdominal pain, blood in stool, constipation, diarrhea, heartburn, melena, nausea and vomiting.  Genitourinary: Negative for dysuria, frequency and urgency.  Musculoskeletal: Negative for back pain and joint pain.  Skin: Negative.  Negative for itching and rash.  Neurological: Positive for tingling. Negative for focal weakness, weakness and headaches.  Endo/Heme/Allergies: Does not bruise/bleed easily.  Psychiatric/Behavioral: Negative for depression. The patient is not nervous/anxious and does not have insomnia.       PAST MEDICAL HISTORY :  Past Medical History:  Diagnosis Date  . Anemia    iron deficiency and B12 deficiency  . Anxiety   . Breast cancer ([name]) 02/25/2015   upper-outer quadrant of left female breast, Triple Negative. Neo-adjuvant chemo with complete pathologic response.   . Cervical dysplasia    conization  . Diabetes mellitus without complication ([place])   . Diverticulosis   . Fever 04/11/2015  . Gastroparesis    related to previous radiation tx  . GERD (gastroesophageal reflux disease)   . Hyperlipidemia   . Hypertension   . Neuropathy    legs/feet, S/P chemo meds  . Personal history of chemotherapy   . Personal history of radiation therapy 2017   LEFT lumpectomy w/ radiation  . RLS (restless legs syndrome)   . Shingles  Hx of  . 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 Wears dentures    partial bottom    PAST SURGICAL HISTORY :   Past Surgical  History:  Procedure Laterality Date  . APPENDECTOMY    . BREAST BIOPSY Left 02/25/2015   INVASIVE MAMMARY CARCINOMA,Triple negative.  [name] BREAST CYST ASPIRATION    . BREAST EXCISIONAL BIOPSY Left 12/2015   surgery  . BREAST LUMPECTOMY WITH NEEDLE LOCALIZATION Left 10/02/2015   Procedure: BREAST LUMPECTOMY WITH NEEDLE LOCALIZATION;  Surgeon: [name], MD;  Location: [facility];  Service: General;  Laterality: Left;  . BREAST LUMPECTOMY WITH SENTINEL LYMPH NODE BIOPSY Left 10/02/2015   Procedure: LEFT BREAST WIDE EXCISION WITH SENTINEL LYMPH NODE BX;  Surgeon: [name], MD;  Location: [facility];  Service: General;  Laterality: Left;  . BREAST SURGERY     breast biopsy benign  . CARPAL TUNNEL RELEASE Right 05/19/2017   Procedure: CARPAL TUNNEL RELEASE;  Surgeon: Earnestine Leys, MD;  Location: [facility];  Service: Orthopedics;  Laterality: Right;  . CATARACT EXTRACTION W/PHACO Right 10/26/2018   Procedure: CATARACT EXTRACTION PHACO AND INTRAOCULAR LENS PLACEMENT ([place]) right diabetic;  Surgeon: Leandrew Koyanagi, MD;  Location: [facility];  Service: Ophthalmology;  Laterality: Right;  Diabetic - oral meds cancer center been notified and will come  . CATARACT EXTRACTION W/PHACO Left 11/16/2018   Procedure: CATARACT EXTRACTION PHACO AND INTRAOCULAR LENS PLACEMENT ([facility]) LEFT DIABETIC  01:01.0  17.0%  10.37;  Surgeon: Leandrew Koyanagi, MD;  Location: [facility];  Service: Ophthalmology;  Laterality: Left;  Diabetic - oral meds Port-a-cath  . CHOLECYSTECTOMY    . Cologuard  07/22/2016   Negative  . COLONOSCOPY WITH PROPOFOL N/A 05/24/2020   Procedure: COLONOSCOPY WITH PROPOFOL;  Surgeon: Virgel Manifold, MD;  Location: [facility];  Service: Endoscopy;  Laterality: N/A;  . ESOPHAGOGASTRODUODENOSCOPY  07/2009   normal with few gastric polyps  . ESOPHAGOGASTRODUODENOSCOPY (EGD) WITH PROPOFOL N/A 05/24/2020   Procedure: ESOPHAGOGASTRODUODENOSCOPY (EGD) WITH  PROPOFOL;  Surgeon: Virgel Manifold, MD;  Location: [facility];  Service: Endoscopy;  Laterality: N/A;  . MOLE REMOVAL    . PORT-A-CATH REMOVAL    . PORTACATH PLACEMENT    . PORTACATH PLACEMENT Right 03/12/2015   Procedure: INSERTION PORT-A-CATH;  Surgeon: [name], MD;  Location: [facility];  Service: General;  Laterality: Right;  . RADICAL NECK DISSECTION    . TONSILLECTOMY     cancer treated with chemo and radiation  . TRIGGER FINGER RELEASE Right 05/19/2017   Procedure: RELEASE TRIGGER FINGER/A-1 PULLEY ring finger;  Surgeon: Earnestine Leys, MD;  Location: [facility];  Service: Orthopedics;  Laterality: Right;    FAMILY HISTORY :   Family History  Problem Relation Age of Onset  . Osteoporosis Mother   . Hyperlipidemia Mother   . Depression Mother   . Cancer Mother        skin cancer ? basal cell and lung ca heavy smoker  . Alcohol abuse Father   . Cancer Father        skin CA ? basal cell  . Heart disease Father        CHF  . Hyperlipidemia Brother   . Hypertension Brother   . Breast cancer Neg Hx     SOCIAL HISTORY:   Social History   Tobacco Use  . Smoking status: Former Smoker    Packs/day: 0.50    Years: 6.00    Pack years: 3.00    Types:  Cigarettes    Quit date: 03/10/1984    Years since quitting: 36.2  . Smokeless tobacco: Never Used  Vaping Use  . Vaping Use: Never used  Substance Use Topics  . Alcohol use: No    Alcohol/week: 0.0 standard drinks  . Drug use: No    ALLERGIES:  is allergic to amoxicillin, penicillin g, fluconazole, difuleron [ferrous fumarate-dss], and other.  MEDICATIONS:  Current Outpatient Medications  Medication Sig Dispense Refill  . albuterol (VENTOLIN HFA) 108 (90 Base) MCG/ACT inhaler INHALE TWO PUFFS BY MOUTH EVERY SIX HOURS AS NEEDED 8.5 g 3  . ALPRAZolam (XANAX) 0.5 MG tablet TAKE ONE TABLET BY MOUTH twice daily AS NEEDED FOR ANXIETY OR SLEEP 60 tablet 1  . amitriptyline (ELAVIL) 50 MG tablet Take 1 tablet (50  mg total) by mouth at bedtime. 90 tablet 3  . amLODipine (NORVASC) 5 MG tablet Take 1 tablet (5 mg total) by mouth daily. 90 tablet 1  . atorvastatin (LIPITOR) 10 MG tablet TAKE 1 TABLET(10 MG) BY MOUTH DAILY 90 tablet 3  . b complex vitamins tablet Take 1 tablet by mouth daily.    . busPIRone (BUSPAR) 15 MG tablet Take 1 tablet (15 mg total) by mouth 2 (two) times daily. 60 tablet 5  . Cholecalciferol (VITAMIN D-1000 MAX ST) 25 MCG (1000 UT) tablet Take 2,000 Units by mouth daily. Takes three 50 mcg tablets, 3 capsules daily    . ferrous sulfate 325 (65 FE) MG tablet Take 325 mg by mouth daily with breakfast.    . glipiZIDE (GLUCOTROL) 5 MG tablet Take 5 mg by mouth 2 (two) times daily.    [name] glucose blood test strip To check blood glucose once daily and prn for diabetes 100 each 3  . JANUMET XR 50-1000 MG TB24 Take 1 tablet by mouth 2 (two) times daily.   0  . lidocaine-prilocaine (EMLA) cream Apply 1 application topically as needed. Apply to port when going through Chemo    . Multiple Vitamins-Minerals (WOMENS MULTIVITAMIN PO) Take by mouth.    [name] omeprazole (PRILOSEC) 20 MG capsule TAKE 1 CAPSULE(20 MG) BY MOUTH AT BEDTIME 90 capsule 3  . pregabalin (LYRICA) 75 MG capsule Take 1 capsule (75 mg total) by mouth daily. 30 capsule 1  . rOPINIRole (REQUIP) 1 MG tablet TAKE 1/2 TABLET BY MOUTH EVERY MORNING and TAKE THREE TABLETS BY MOUTH EVERYDAY AT BEDTIME 315 tablet 5  . sertraline (ZOLOFT) 100 MG tablet TAKE TWO TABLETS BY MOUTH EVERY MORNING 60 tablet 1  . Suvorexant (BELSOMRA) 20 MG TABS Take 1 tablet by mouth at bedtime. 30 tablet 2  . traZODone (DESYREL) 50 MG tablet Take 1/2-2 tabs po QHS prn 60 tablet 2  . traZODone (DESYREL) 50 MG tablet Take 1/2-2 tabs po QHS prn insomnia 60 tablet 1  . meloxicam (MOBIC) 7.5 MG tablet Take 7.5 mg by mouth daily. (Patient not taking: Reported on 05/28/2020)    . Na Sulfate-K Sulfate-Mg Sulf 17.5-3.13-1.6 GM/177ML SOLN At 5 PM the day before procedure take 1  bottle and 5 hours before procedure take 1 bottle. (Patient not taking: Reported on 05/28/2020) 354 mL 0   No current facility-administered medications for this visit.   Facility-Administered Medications Ordered in Other Visits  Medication Dose Route Frequency Provider Last Rate Last Admin  . sodium chloride flush (NS) 0.9 % injection 10 mL  10 mL Intravenous PRN Cammie Sickle, MD   10 mL at 09/11/15 0845    PHYSICAL EXAMINATION: ECOG  PERFORMANCE STATUS: 1 - Symptomatic but completely ambulatory  There were no vitals taken for this visit.  There were no vitals filed for this visit.  Physical Exam Constitutional:      Comments: Obese.  She is walking herself.  Alone.  HENT:     Head: Normocephalic and atraumatic.     Mouth/Throat:     Pharynx: No oropharyngeal exudate.  Eyes:     Pupils: Pupils are equal, round, and reactive to light.  Cardiovascular:     Rate and Rhythm: Normal rate and regular rhythm.  Pulmonary:     Effort: No respiratory distress.     Breath sounds: No wheezing.  Abdominal:     General: Bowel sounds are normal. There is no distension.     Palpations: Abdomen is soft. There is no mass.     Tenderness: There is no abdominal tenderness. There is no guarding or rebound.  Musculoskeletal:        General: No tenderness. Normal range of motion.     Cervical back: Normal range of motion and neck supple.  Skin:    General: Skin is warm.  Neurological:     Mental Status: She is alert and oriented to person, place, and time.  Psychiatric:        Mood and Affect: Affect normal.     LABORATORY DATA:  I have reviewed the data as listed    Component Value Date/Time   NA 140 05/28/2020 1129   NA 142 03/01/2014 0846   K 3.9 05/28/2020 1129   K 4.3 03/01/2014 0846   CL 103 05/28/2020 1129   CL 102 03/01/2014 0846   CO2 25 05/28/2020 1129   CO2 28 03/01/2014 0846   GLUCOSE 181 (H) 05/28/2020 1129   GLUCOSE 219 (H) 03/01/2014 0846   BUN 14 05/28/2020  1129   BUN 12 03/01/2014 0846   CREATININE 1.05 (H) 05/28/2020 1129   CREATININE 0.95 03/01/2014 0846   CALCIUM 8.5 (L) 05/28/2020 1129   CALCIUM 8.6 03/01/2014 0846   PROT 8.3 (H) 05/28/2020 1129   PROT 7.5 03/01/2014 0846   ALBUMIN 3.9 05/28/2020 1129   ALBUMIN 3.4 03/01/2014 0846   AST 22 05/28/2020 1129   AST 20 03/01/2014 0846   ALT 11 05/28/2020 1129   ALT 28 03/01/2014 0846   ALKPHOS 77 05/28/2020 1129   ALKPHOS 122 (H) 03/01/2014 0846   BILITOT 0.5 05/28/2020 1129   BILITOT 0.2 03/01/2014 0846   GFRNONAA 56 (L) 05/28/2020 1129   GFRNONAA >60 03/01/2014 0846   GFRNONAA >60 08/24/2013 1015   GFRAA >60 10/20/2019 0950   GFRAA >60 03/01/2014 0846   GFRAA >60 08/24/2013 1015    No results found for: SPEP, UPEP  Lab Results  Component Value Date   WBC 9.0 05/28/2020   NEUTROABS 7.7 05/28/2020   HGB 10.6 (L) 05/28/2020   HCT 35.1 (L) 05/28/2020   MCV 89.1 05/28/2020   PLT 233 05/28/2020      Chemistry      Component Value Date/Time   NA 140 05/28/2020 1129   NA 142 03/01/2014 0846   K 3.9 05/28/2020 1129   K 4.3 03/01/2014 0846   CL 103 05/28/2020 1129   CL 102 03/01/2014 0846   CO2 25 05/28/2020 1129   CO2 28 03/01/2014 0846   BUN 14 05/28/2020 1129   BUN 12 03/01/2014 0846   CREATININE 1.05 (H) 05/28/2020 1129   CREATININE 0.95 03/01/2014 0846      Component  Value Date/Time   CALCIUM 8.5 (L) 05/28/2020 1129   CALCIUM 8.6 03/01/2014 0846   ALKPHOS 77 05/28/2020 1129   ALKPHOS 122 (H) 03/01/2014 0846   AST 22 05/28/2020 1129   AST 20 03/01/2014 0846   ALT 11 05/28/2020 1129   ALT 28 03/01/2014 0846   BILITOT 0.5 05/28/2020 1129   BILITOT 0.2 03/01/2014 0846       RADIOGRAPHIC STUDIES: I have personally reviewed the radiological images as listed and agreed with the findings in the report. No results found.   ASSESSMENT & PLAN:  Carcinoma of upper-outer quadrant of left breast in female, estrogen receptor negative ([place]) # Left breast cancer  [2017] stage III  TRIPLE NEGATIVE-  s/p neoadjuvant chemotherapy-currently under surveillance. Feb 2022- Mammo- WNL.  Stable.  # Continue close surveillance evidence of recurrence; will switch over to annual surveillance starting next visit.    # Mild anemia -hemoglobin 10-11 for the last 10 years; stable.  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  Defer to GI regarding follow-up.  # DISPOSITION: # follow up in 6 m with MD/labs-cbc/cmp/ca-27-29; reclast- -Dr.B   Orders Placed This Encounter  Procedures  . CBC with Differential    Standing Status:   Future    Standing Expiration Date:   05/28/2021  . Comprehensive metabolic panel    Standing Status:   Future    Standing Expiration Date:   05/28/2021  . Cancer antigen 27.29    Standing Status:   Future    Standing Expiration Date:   05/28/2021   All questions were answered. The patient knows to call the clinic with any problems, questions or concerns.      Cammie Sickle, MD 05/28/2020 1:04 PM

## 2020-05-28 NOTE — H&P (Signed)
[place]   PATIENT NAME: Suzanne Strong    MR#:  [id number]  DATE OF BIRTH:  1945-03-10  DATE OF ADMISSION:  05/28/2020  PRIMARY CARE PHYSICIAN: Tower, [name], MD   Patient is coming from: Home.  REQUESTING/REFERRING PHYSICIAN: Menshew, Dannielle Karvonen, PA-C  CHIEF COMPLAINT:   Chief Complaint  Patient presents with  . Shortness of Breath    HISTORY OF PRESENT ILLNESS:  Suzanne Strong is a 75 y.o. 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  She was with her friend having an primary care appointment today when this happened prior to entering the office.  When her friend went to take her to eye laser surgery appointment this afternoon she was fairly unsteady more than her usual and had slight altered mental status with confusion per her friend who was with her in the ER.  She had the laser therapy and had to be placed in a wheelchair to get to the car before she came to the ER.  She was tachycardic tachypneic and hypoxic to 82% on room air when she came to the ER.  No reported chest pain or palpitations, nausea or vomiting or abdominal pain.  No dysuria, oliguria or hematuria or flank pain.  The patient admits to choking on food lately.  She apparently has some sort of a chronic dysphagia.  She admits to dry cough and mild dyspnea without wheezing.  She had fever and chills 2 nights ago.  She denies any headache or dizziness or blurred vision.    ED Course: Blood pressure was 152/74 with heart rate of 102 and temperature of 96 and pulse currently was 80 to 82% on  room air and was up to 92% on 4 L of O2 by nasal cannula.  Respiratory rate was normal and later on was 27. Labs revealed mild anemia close to baseline and CMP was remarkable for blood glucose of 183 and magnesium of 1.3 with potassium of 4.1 and albumin 3.2.  D-dimer came back 1.94.  Influenza antigens and COVID-19 PCR came back negative.  UA showed 6-10 WBCs with no nitrites or bacteria.  Blood culture were drawn.  Imaging: Chest x-ray showed cardiomegaly with vascular congestion and hazy pulmonary airspace disease, possibly edema with patchy confluent airspace disease at the bases that may reflect atelectasis or pneumonia.  Chest CTA revealed no evidence for PE.  It showed bibasilar airspace disease and trace left pleural effusion favoring pneumonia over edema as well as mediastinal and bilateral hilar adenopathy likely reactive and aortic atherosclerosis.  The patient was given IV Levaquin and Flagyl as well as ordered 2 L bolus of IV normal saline.  The patient was slightly hypotensive after another 500 mL IV normal saline bolus.  She will receive a liter bolus wide open will be admitted to a stepdown unit bed for further evaluation and management. PAST MEDICAL HISTORY:   Past Medical History:  Diagnosis Date  . Anemia    iron deficiency and B12 deficiency  . Anxiety   . Breast cancer ([place]) 02/25/2015   upper-outer quadrant of left female breast, Triple Negative. Neo-adjuvant chemo with complete pathologic response.   [name]  Cervical dysplasia    conization  . Diabetes mellitus without complication ([place])   . Diverticulosis   . Fever 04/11/2015  . Gastroparesis    related to previous radiation tx  . GERD (gastroesophageal reflux disease)   . Hyperlipidemia   . Hypertension   . Neuropathy    legs/feet, S/P chemo meds  . Personal history of chemotherapy   . Personal history of radiation therapy 2017   LEFT lumpectomy w/ radiation  . RLS (restless legs syndrome)   . Shingles    Hx of  .  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[place]) 2006  . Wears dentures    partial bottom    PAST SURGICAL HISTORY:   Past Surgical History:  Procedure Laterality Date  . APPENDECTOMY    . BREAST BIOPSY Left 02/25/2015   INVASIVE MAMMARY CARCINOMA,Triple negative.  [name] BREAST CYST ASPIRATION    . BREAST EXCISIONAL BIOPSY Left 12/2015   surgery  . BREAST LUMPECTOMY WITH NEEDLE LOCALIZATION Left 10/02/2015   Procedure: BREAST LUMPECTOMY WITH NEEDLE LOCALIZATION;  Surgeon: [name], MD;  Location: [facility];  Service: General;  Laterality: Left;  . BREAST LUMPECTOMY WITH SENTINEL LYMPH NODE BIOPSY Left 10/02/2015   Procedure: LEFT BREAST WIDE EXCISION WITH SENTINEL LYMPH NODE BX;  Surgeon: [name], MD;  Location: [facility];  Service: General;  Laterality: Left;  . BREAST SURGERY     breast biopsy benign  . CARPAL TUNNEL RELEASE Right 05/19/2017   Procedure: CARPAL TUNNEL RELEASE;  Surgeon: Earnestine Leys, MD;  Location: [facility];  Service: Orthopedics;  Laterality: Right;  . CATARACT EXTRACTION W/PHACO Right 10/26/2018   Procedure: CATARACT EXTRACTION PHACO AND INTRAOCULAR LENS PLACEMENT ([place]) right diabetic;  Surgeon: Leandrew Koyanagi, MD;  Location: [place];  Service: Ophthalmology;  Laterality: Right;  Diabetic - oral meds cancer center been notified and will come  . CATARACT EXTRACTION W/PHACO Left 11/16/2018   Procedure: CATARACT EXTRACTION PHACO AND INTRAOCULAR LENS PLACEMENT ([facility]) LEFT DIABETIC  01:01.0  17.0%  10.37;  Surgeon: Leandrew Koyanagi, MD;  Location: [place];  Service: Ophthalmology;  Laterality: Left;  Diabetic - oral meds Port-a-cath  . CHOLECYSTECTOMY    . Cologuard  07/22/2016   Negative  . COLONOSCOPY WITH PROPOFOL N/A 05/24/2020   Procedure: COLONOSCOPY WITH PROPOFOL;  Surgeon: Virgel Manifold, MD;  Location: [facility];  Service: Endoscopy;  Laterality: N/A;  . ESOPHAGOGASTRODUODENOSCOPY  07/2009   normal with few gastric polyps  .  ESOPHAGOGASTRODUODENOSCOPY (EGD) WITH PROPOFOL N/A 05/24/2020   Procedure: ESOPHAGOGASTRODUODENOSCOPY (EGD) WITH PROPOFOL;  Surgeon: Virgel Manifold, MD;  Location: [facility];  Service: Endoscopy;  Laterality: N/A;  . MOLE REMOVAL    . PORT-A-CATH REMOVAL    . PORTACATH PLACEMENT    . PORTACATH PLACEMENT Right 03/12/2015   Procedure: INSERTION PORT-A-CATH;  Surgeon: [name], MD;  Location: [facility];  Service: General;  Laterality: Right;  . RADICAL NECK DISSECTION    . TONSILLECTOMY     cancer treated with chemo and radiation  . TRIGGER FINGER RELEASE Right 05/19/2017   Procedure: RELEASE TRIGGER FINGER/A-1 PULLEY ring finger;  Surgeon: Earnestine Leys, MD;  Location: [facility];  Service: Orthopedics;  Laterality: Right;    SOCIAL HISTORY:   Social History   Tobacco Use  . Smoking status: Former Smoker    Packs/day: 0.50    Years: 6.00    Pack years: 3.00    Types: Cigarettes    Quit date: 03/10/1984    Years since quitting:  36.2  . Smokeless tobacco: Never Used  Substance Use Topics  . Alcohol use: No    Alcohol/week: 0.0 standard drinks    FAMILY HISTORY:   Family History  Problem Relation Age of Onset  . Osteoporosis Mother   . Hyperlipidemia Mother   . Depression Mother   . Cancer Mother        skin cancer ? basal cell and lung ca heavy smoker  . Alcohol abuse Father   . Cancer Father        skin CA ? basal cell  . Heart disease Father        CHF  . Hyperlipidemia Brother   . Hypertension Brother   . Breast cancer Neg Hx     DRUG ALLERGIES:   Allergies  Allergen Reactions  . Amoxicillin Swelling    REACTION: rash, swelling Has patient had a PCN reaction causing immediate rash, facial/tongue/throat swelling, SOB or lightheadedness with hypotension: yes Has patient had a PCN reaction causing severe rash involving mucus membranes or skin necrosis: no Has patient had a PCN reaction that required hospitalization: no Has patient had a PCN reaction  occurring within the last 10 years: yes If all of the above answers are "NO", then may proceed with Cephalosporin use.   [name] Penicillin G Anaphylaxis  . Fluconazole Rash    REACTION: hives  . Difuleron [Ferrous Fumarate-Dss]   . Other Other (See Comments)    Pt has had tonsil cancer (on Left side) - pt may have difficulty swallowing    REVIEW OF SYSTEMS:   As per history of present illness. All pertinent systems were reviewed above. Constitutional, HEENT, cardiovascular, respiratory, GI, GU, musculoskeletal, neuro, psychiatric, endocrine, integumentary and hematologic systems were reviewed and are otherwise negative/unremarkable except for positive findings mentioned above in the HPI.   MEDICATIONS AT HOME:   Prior to Admission medications   Medication Sig Start Date End Date Taking? Authorizing Provider  albuterol (VENTOLIN HFA) 108 (90 Base) MCG/ACT inhaler INHALE TWO PUFFS BY MOUTH EVERY SIX HOURS AS NEEDED 01/09/20   Tower, [name], MD  ALPRAZolam Duanne Moron) 0.5 MG tablet TAKE ONE TABLET BY MOUTH twice daily AS NEEDED FOR ANXIETY OR SLEEP 05/09/20   Thayer Headings, PMHNP  amitriptyline (ELAVIL) 50 MG tablet Take 1 tablet (50 mg total) by mouth at bedtime. 12/07/16   Cammie Sickle, MD  amLODipine (NORVASC) 5 MG tablet Take 1 tablet (5 mg total) by mouth daily. 05/15/20   Tower, [name], MD  atorvastatin (LIPITOR) 10 MG tablet TAKE 1 TABLET(10 MG) BY MOUTH DAILY 06/02/19   Tower, [name], MD  b complex vitamins tablet Take 1 tablet by mouth daily.    [provider]  busPIRone (BUSPAR) 15 MG tablet Take 1 tablet (15 mg total) by mouth 2 (two) times daily. 02/07/20 03/08/20  Thayer Headings, PMHNP  Cholecalciferol (VITAMIN D-1000 MAX ST) 25 MCG (1000 UT) tablet Take 2,000 Units by mouth daily. Takes three 50 mcg tablets, 3 capsules daily    [provider]  ferrous sulfate 325 (65 FE) MG tablet Take 325 mg by mouth daily with breakfast.    [provider]   glipiZIDE (GLUCOTROL) 5 MG tablet Take 5 mg by mouth 2 (two) times daily. 03/22/20   [provider]  glucose blood test strip To check blood glucose once daily and prn for diabetes 10/20/19   Tower, [name], MD  JANUMET XR 50-1000 MG TB24 Take 1 tablet by mouth 2 (two)  times daily.  03/13/15   [provider]  lidocaine-prilocaine (EMLA) cream Apply 1 application topically as needed. Apply to port when going through Chemo    [provider]  meloxicam (MOBIC) 7.5 MG tablet Take 7.5 mg by mouth daily. Patient not taking: Reported on 05/28/2020 05/02/20   [provider]  Multiple Vitamins-Minerals (WOMENS MULTIVITAMIN PO) Take by mouth.    [provider]  Na Sulfate-K Sulfate-Mg Sulf 17.5-3.13-1.6 GM/177ML SOLN At 5 PM the day before procedure take 1 bottle and 5 hours before procedure take 1 bottle. Patient not taking: Reported on 05/28/2020 04/17/20   Virgel Manifold, MD  omeprazole (PRILOSEC) 20 MG capsule TAKE 1 CAPSULE(20 MG) BY MOUTH AT BEDTIME 06/02/19   Tower, [name], MD  pregabalin (LYRICA) 75 MG capsule Take 1 capsule (75 mg total) by mouth daily. 02/17/20 04/17/20  Shelly Coss, MD  rOPINIRole (REQUIP) 1 MG tablet TAKE 1/2 TABLET BY MOUTH EVERY MORNING and TAKE THREE TABLETS BY MOUTH EVERYDAY AT BEDTIME 12/21/19   Tower, [name], MD  sertraline (ZOLOFT) 100 MG tablet TAKE TWO TABLETS BY MOUTH EVERY MORNING 05/09/20   Thayer Headings, PMHNP  Suvorexant (BELSOMRA) 20 MG TABS Take 1 tablet by mouth at bedtime. 05/03/20   Thayer Headings, PMHNP  traZODone (DESYREL) 50 MG tablet Take 1/2-2 tabs po QHS prn 05/03/20   Thayer Headings, PMHNP  traZODone (DESYREL) 50 MG tablet Take 1/2-2 tabs po QHS prn insomnia 05/09/20   Thayer Headings, PMHNP      VITAL SIGNS:  Blood pressure (!) 102/35, pulse (!) 102, temperature 99.5 F (37.5 C), temperature source Oral, resp. rate (!) 21, height 5\' 6"  (1.676 m), weight 100.7 kg, SpO2 91 %.  PHYSICAL EXAMINATION:   Physical Exam  GENERAL:  75 y.o.-year-old Caucasian female patient lying in the bed with mild respiratory distress with conversational dyspnea. EYES: Pupils equal, round, reactive to light and accommodation. No scleral icterus. Extraocular muscles intact.  HEENT: Head atraumatic, normocephalic. Oropharynx and nasopharynx clear.  NECK:  Supple, no jugular venous distention. No thyroid enlargement, no tenderness.  LUNGS: Diminished bibasal breath sounds with bibasal crackles.   CARDIOVASCULAR: Regular rate and rhythm, S1, S2 normal. No murmurs, rubs, or gallops.  ABDOMEN: Soft, nondistended, nontender. Bowel sounds present. No organomegaly or mass.  EXTREMITIES: No pedal edema, cyanosis, or clubbing.  NEUROLOGIC: Cranial nerves II through XII are intact. Muscle strength 5/5 in all extremities. Sensation intact. Gait not checked.  PSYCHIATRIC: The patient is alert and oriented x 3.  Normal affect and good eye contact. SKIN: No obvious rash, lesion, or ulcer.   LABORATORY PANEL:   CBC Recent Labs  Lab 05/28/20 1700  WBC 8.0  HGB 10.2*  HCT 32.7*  PLT 220   ------------------------------------------------------------------------------------------------------------------  Chemistries  Recent Labs  Lab 05/28/20 1700  NA 138  K 4.1  CL 104  CO2 23  GLUCOSE 183*  BUN 15  CREATININE 1.22*  CALCIUM 8.5*  MG 1.3*  AST 25  ALT 12  ALKPHOS 67  BILITOT 0.4   ------------------------------------------------------------------------------------------------------------------  Cardiac Enzymes No results for input(s): TROPONINI in the last 168 hours. ------------------------------------------------------------------------------------------------------------------  RADIOLOGY:  CT Angio Chest PE W and/or Wo Contrast  Result Date: 05/28/2020 CLINICAL DATA:  Acute shortness of breath, elevated D-dimer, history of left breast cancer EXAM: CT ANGIOGRAPHY CHEST WITH CONTRAST TECHNIQUE:  Multidetector CT imaging of the chest was performed using the standard protocol during bolus administration of intravenous contrast. Multiplanar CT image reconstructions and MIPs were  obtained to evaluate the vascular anatomy. CONTRAST:  55mL OMNIPAQUE IOHEXOL 350 MG/ML SOLN COMPARISON:  05/28/2020 FINDINGS: Cardiovascular: This is a technically adequate evaluation of the pulmonary vasculature. No filling defects or pulmonary emboli. The heart is unremarkable without pericardial effusion. No evidence of thoracic aortic aneurysm or dissection. Moderate atherosclerosis of the aortic arch and descending thoracic aorta. Mediastinum/Nodes: Mediastinal and bilateral hilar adenopathy are seen. Subcarinal adenopathy and precarinal adenopathy measure up to 12 mm in short axis. Thyroid, trachea, and esophagus are unremarkable. Lungs/Pleura: There is bibasilar airspace disease greatest within the lower lobes. Trace left pleural fluid. No pneumothorax. Central airways are patent. Upper Abdomen: No acute abnormality. Musculoskeletal: No acute or destructive bony lesions. Reconstructed images demonstrate no additional findings. Review of the MIP images confirms the above findings. IMPRESSION: 1. No evidence of pulmonary embolus. 2. Bibasilar airspace disease and trace left pleural effusion, favor pneumonia over edema. 3. Mediastinal and bilateral hilar adenopathy, likely reactive. 4.  Aortic Atherosclerosis (ICD10-I70.0). Electronically Signed   By: Randa Ngo M.D.   On: 05/28/2020 18:51   DG Chest Portable 1 View  Result Date: 05/28/2020 CLINICAL DATA:  Shortness of breath EXAM: PORTABLE CHEST 1 VIEW COMPARISON:  02/14/2020 FINDINGS: Cardiomegaly with vascular congestion and hazy pulmonary airspace disease, possible edema. More patchy confluent airspace disease at the bases. No pleural effusion or pneumothorax. IMPRESSION: Cardiomegaly with vascular congestion and hazy pulmonary airspace disease, possible edema. Patchy  confluent airspace disease at the bases may reflect atelectasis or pneumonia. Electronically Signed   By: Donavan Foil M.D.   On: 05/28/2020 18:09      IMPRESSION AND PLAN:  Active Problems:   CAP (community acquired pneumonia)  1.  Community-acquired pneumonia possibly aspiration with subsequent acute hypoxic respiratory failure. -The patient will be admitted to a stepdown unit bed -We will continue IV Levaquin as the patient had previous anaphylaxis to penicillin G that would make [place] reluctant to give cephalosporins. -Mucolytic therapy will be provided. -Bronchodilator therapy will be provided as needed and scheduled basis. -We will follow sputum and blood cultures. -O2 protocol will be followed. -Speech therapy consult to be obtained. -Oral medications will be given only as tolerated otherwise will be converted to IV.  2.  Sepsis secondary to pneumonia as manifested by tachycardia and tachypnea.  The patient meets criteria for severe sepsis given elevated lactic acid, hypoxia and hypotension. -The patient will be aggressively hydrated with IV normal saline.  Should she not respond she may need IV pressor therapy.   -We will continue IV Levaquin as mentioned above. -We will follow blood and sputum cultures as well as obtain pneumonia antigens.  3.  Elevated troponin I. -I suspect that this is related to demand ischemia. -We will follow serial troponin I's.  4.  Elevated D-dimer with negative PE on chest CT. -I believe this like secondary to her pneumonia. -We will obtain bilateral lower extremity venous Doppler.  5.  Mild pyuria without strong evidence for UTI. -We will obtain urine culture sensitivity.  6.  Hypomagnesemia. -Magnesium will be aggressively replaced.  7.  Type 2 diabetes mellitus with peripheral neuropathy.. -The patient will be placed on supplement coverage with NovoLog and continue glipizide. -We will continue Lyrica.  8.  Dyslipidemia. -We will continue  statin therapy.  9.  History of essential hypertension. -We will hold off her antihypertensives given hypotension.  10.  Anxiety and depression. -We will continue BuSpar and as needed Xanax as well as continue Zoloft and trazodone.  Given  her swallowing difficulty should be placed on as needed IV Ativan.  11.  Restless leg syndrome. -We will continue Requip.  This will be switched to IV muscle relaxant therapy when she is not able to swallow.  DVT prophylaxis: Lovenox. Code Status: full code. Family Communication:  The plan of care was discussed in details with the patient (and family). I answered all questions. The patient agreed to proceed with the above mentioned plan. Further management will depend upon hospital course. Disposition Plan: Back to previous home environment Consults called: none.   All the records are reviewed and case discussed with ED provider.  Status is: Inpatient  Remains inpatient appropriate because:Ongoing diagnostic testing needed not appropriate for outpatient work up, Unsafe d/c plan, IV treatments appropriate due to intensity of illness or inability to take PO and Inpatient level of care appropriate due to severity of illness   Dispo: The patient is from: Home              Anticipated d/c is to: Home              Patient currently is not medically stable to d/c.   Difficult to place patient No   TOTAL TIME TAKING CARE OF THIS PATIENT: 55 minutes.    Christel Mormon M.D on 05/28/2020 at 7:48 PM  [facility]   From 7 PM-7 AM, contact night-coverage [email or web address]  CC: Primary care physician; Tower, [name], MD

## 2020-05-28 NOTE — ED Notes (Signed)
MP Suzanne Strong at bedside to reassess pt as well as change PO status for medication and diet, due to difficulty swallowing post tonsillary cancer with radiation treatment. Pt able to drink fluids but no straw or ice chips.

## 2020-05-29 DIAGNOSIS — J690 Pneumonitis due to inhalation of food and vomit: Secondary | ICD-10-CM

## 2020-05-29 LAB — EXPECTORATED SPUTUM ASSESSMENT W GRAM STAIN, RFLX TO RESP C

## 2020-05-29 LAB — BASIC METABOLIC PANEL
Anion gap: 8 (ref 5–15)
BUN: 17 mg/dL (ref 8–23)
CO2: 23 mmol/L (ref 22–32)
Calcium: 7.1 mg/dL — ABNORMAL LOW (ref 8.9–10.3)
Chloride: 108 mmol/L (ref 98–111)
Creatinine, Ser: 1.06 mg/dL — ABNORMAL HIGH (ref 0.44–1.00)
GFR, Estimated: 56 mL/min — ABNORMAL LOW (ref >60)
Glucose, Bld: 156 mg/dL — ABNORMAL HIGH (ref 70–99)
Potassium: 4.4 mmol/L (ref 3.5–5.1)
Sodium: 139 mmol/L (ref 135–145)

## 2020-05-29 LAB — MAGNESIUM: Magnesium: 2.6 mg/dL — ABNORMAL HIGH (ref 1.7–2.4)

## 2020-05-29 LAB — GLUCOSE, CAPILLARY
Glucose-Capillary: 117 mg/dL — ABNORMAL HIGH (ref 70–99)
Glucose-Capillary: 125 mg/dL — ABNORMAL HIGH (ref 70–99)
Glucose-Capillary: 92 mg/dL (ref 70–99)
Glucose-Capillary: 93 mg/dL (ref 70–99)

## 2020-05-29 LAB — CANCER ANTIGEN 27.29: CA 27.29: 11.5 U/mL (ref 0.0–38.6)

## 2020-05-29 LAB — HEMOGLOBIN A1C
Hgb A1c MFr Bld: 6.7 % — ABNORMAL HIGH (ref 4.8–5.6)
Mean Plasma Glucose: 145.59 mg/dL

## 2020-05-29 LAB — CBC
HCT: 27.6 % — ABNORMAL LOW (ref 36.0–46.0)
Hemoglobin: 8.4 g/dL — ABNORMAL LOW (ref 12.0–15.0)
MCH: 26.7 pg (ref 26.0–34.0)
MCHC: 30.4 g/dL (ref 30.0–36.0)
MCV: 87.6 fL (ref 80.0–100.0)
Platelets: 188 10*3/uL (ref 150–400)
RBC: 3.15 MIL/uL — ABNORMAL LOW (ref 3.87–5.11)
RDW: 15.8 % — ABNORMAL HIGH (ref 11.5–15.5)
WBC: 13.5 10*3/uL — ABNORMAL HIGH (ref 4.0–10.5)
nRBC: 0 % (ref 0.0–0.2)

## 2020-05-29 LAB — PROCALCITONIN
Procalcitonin: 0.92 ng/mL
Procalcitonin: 6.2 ng/mL

## 2020-05-29 LAB — HIV ANTIBODY (ROUTINE TESTING W REFLEX): HIV Screen 4th Generation wRfx: NONREACTIVE

## 2020-05-29 LAB — LACTIC ACID, PLASMA: Lactic Acid, Venous: 1.5 mmol/L (ref 0.5–1.9)

## 2020-05-29 LAB — STREP PNEUMONIAE URINARY ANTIGEN: Strep Pneumo Urinary Antigen: NEGATIVE

## 2020-05-29 MED ORDER — METRONIDAZOLE IN NACL 5-0.79 MG/ML-% IV SOLN
500.0000 mg | Freq: Three times a day (TID) | INTRAVENOUS | Status: DC
  Administered 2020-05-29: 500 mg via INTRAVENOUS
  Filled 2020-05-29 (×2): qty 100

## 2020-05-29 MED ORDER — IPRATROPIUM-ALBUTEROL 0.5-2.5 (3) MG/3ML IN SOLN
3.0000 mL | Freq: Two times a day (BID) | RESPIRATORY_TRACT | Status: DC
  Administered 2020-05-29 – 2020-05-31 (×4): 3 mL via RESPIRATORY_TRACT
  Filled 2020-05-29 (×4): qty 3

## 2020-05-29 MED ORDER — LACTATED RINGERS IV BOLUS
500.0000 mL | Freq: Once | INTRAVENOUS | Status: AC
  Administered 2020-05-29: 500 mL via INTRAVENOUS

## 2020-05-29 MED ORDER — TRAZODONE HCL 50 MG PO TABS
50.0000 mg | ORAL_TABLET | Freq: Every day | ORAL | Status: DC
  Administered 2020-05-30 (×2): 50 mg via ORAL
  Filled 2020-05-29 (×2): qty 1

## 2020-05-29 NOTE — Progress Notes (Signed)
  While rounding on the Unit, this Chaplain On-Call visited with the patient.  Chaplain provided listening support as the patient described her illness of pneumonia, and as she stated how it has life-altering effects in her life.  Chaplain also offered time for life review with the patient, who was a high school classmate of this Chaplain.  The patient stated that her Doristine Bosworth at [facility] is aware of her hospitalization.  Chaplain provided spiritual and emotional support and prayer.  Chaplain Pollyann Samples M.Div., [facility]

## 2020-05-29 NOTE — Evaluation (Signed)
Clinical/Bedside Swallow Evaluation Patient Details  Name: ALECE KOPPEL MRN: [id number] Date of Birth: 07-08-1945  Today's Date: 05/29/2020 Time: SLP Start Time (ACUTE ONLY): 1320 SLP Stop Time (ACUTE ONLY): 1420 SLP Time Calculation (min) (ACUTE ONLY): 60 min  Past Medical History:  Past Medical History:  Diagnosis Date  . Anemia    iron deficiency and B12 deficiency  . Anxiety   . Breast cancer ([place]) 02/25/2015   upper-outer quadrant of left female breast, Triple Negative. Neo-adjuvant chemo with complete pathologic response.   . Cervical dysplasia    conization  . Diabetes mellitus without complication ([place])   . Diverticulosis   . Fever 04/11/2015  . Gastroparesis    related to previous radiation tx  . GERD (gastroesophageal reflux disease)   . Hyperlipidemia   . Hypertension   . Neuropathy    legs/feet, S/P chemo meds  . Personal history of chemotherapy   . Personal history of radiation therapy 2017   LEFT lumpectomy w/ radiation  . RLS (restless legs syndrome)   . Shingles    Hx of  . 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 Wears dentures    partial bottom   Past Surgical History:  Past Surgical History:  Procedure Laterality Date  . APPENDECTOMY    . BREAST BIOPSY Left 02/25/2015   INVASIVE MAMMARY CARCINOMA,Triple negative.  [name] BREAST CYST ASPIRATION    . BREAST EXCISIONAL BIOPSY Left 12/2015   surgery  . BREAST LUMPECTOMY WITH NEEDLE LOCALIZATION Left 10/02/2015   Procedure: BREAST LUMPECTOMY WITH NEEDLE LOCALIZATION;  Surgeon: [name], MD;  Location: [facility];  Service: General;  Laterality: Left;  . BREAST LUMPECTOMY WITH SENTINEL LYMPH NODE BIOPSY Left 10/02/2015   Procedure: LEFT BREAST WIDE EXCISION WITH SENTINEL LYMPH NODE BX;  Surgeon: [name], MD;  Location: [facility];  Service: General;  Laterality: Left;  . BREAST SURGERY     breast biopsy benign  . CARPAL TUNNEL RELEASE Right 05/19/2017   Procedure: CARPAL TUNNEL RELEASE;  Surgeon:  Earnestine Leys, MD;  Location: [facility];  Service: Orthopedics;  Laterality: Right;  . CATARACT EXTRACTION W/PHACO Right 10/26/2018   Procedure: CATARACT EXTRACTION PHACO AND INTRAOCULAR LENS PLACEMENT ([place]) right diabetic;  Surgeon: Leandrew Koyanagi, MD;  Location: [facility];  Service: Ophthalmology;  Laterality: Right;  Diabetic - oral meds cancer center been notified and will come  . CATARACT EXTRACTION W/PHACO Left 11/16/2018   Procedure: CATARACT EXTRACTION PHACO AND INTRAOCULAR LENS PLACEMENT ([facility]) LEFT DIABETIC  01:01.0  17.0%  10.37;  Surgeon: Leandrew Koyanagi, MD;  Location: [place];  Service: Ophthalmology;  Laterality: Left;  Diabetic - oral meds Port-a-cath  . CHOLECYSTECTOMY    . Cologuard  07/22/2016   Negative  . COLONOSCOPY WITH PROPOFOL N/A 05/24/2020   Procedure: COLONOSCOPY WITH PROPOFOL;  Surgeon: Virgel Manifold, MD;  Location: [facility];  Service: Endoscopy;  Laterality: N/A;  . ESOPHAGOGASTRODUODENOSCOPY  07/2009   normal with few gastric polyps  . ESOPHAGOGASTRODUODENOSCOPY (EGD) WITH PROPOFOL N/A 05/24/2020   Procedure: ESOPHAGOGASTRODUODENOSCOPY (EGD) WITH PROPOFOL;  Surgeon: Virgel Manifold, MD;  Location: [facility];  Service: Endoscopy;  Laterality: N/A;  . MOLE REMOVAL    . PORT-A-CATH REMOVAL    . PORTACATH PLACEMENT    . PORTACATH PLACEMENT Right 03/12/2015   Procedure: INSERTION PORT-A-CATH;  Surgeon: [name], MD;  Location: [facility];  Service: General;  Laterality: Right;  . RADICAL NECK DISSECTION    . TONSILLECTOMY  cancer treated with chemo and radiation  . TRIGGER FINGER RELEASE Right 05/19/2017   Procedure: RELEASE TRIGGER FINGER/A-1 PULLEY ring finger;  Surgeon: Earnestine Leys, MD;  Location: [facility];  Service: Orthopedics;  Laterality: Right;   HPI:  Pt is a 75 y.o. Caucasian female with medical history significant for breast cancer status post chemotherapy and radiotherapy 5 years ago and  tonsillar cancer status post resection and radiotherapy, type 2 diabetes mellitus, hypertension, dyslipidemia, GERD, anxiety, diverticulosis and restless leg syndrome, who presented to the emergency room with acute onset of generalized weakness as well as shaking chills and presyncope today with subsequent mechanical fall without injuries.  She was with her friend having an primary care appointment today when this happened prior to entering the office.  When her friend went to take her to eye laser surgery appointment this afternoon she was fairly unsteady more than her usual and had slight altered mental status with confusion per her friend who was with her in the ER.  She had the laser therapy and had to be placed in a wheelchair to get to the car before she came to the ER.  She was tachycardic tachypneic and hypoxic to 82% on room air when she came to the ER.  No reported chest pain or palpitations, nausea or vomiting or abdominal pain.  No dysuria, oliguria or hematuria or flank pain.  She admits to dry cough and mild dyspnea without wheezing.  Pt has chronic Dysphagia seen 01/2020-02/2020 w/ MBSSs x2 recommending swallowing precautions and stragegies.  Pt has been maintaining a fairly Regular diet w/ thin liquids w/ no recent Pulmonary issues nor weight loss per [facility] MD notes, and pt report.  Chest CT: Bibasilar airspace disease and trace left pleural effusion, favor  pneumonia over edema.  3. Mediastinal and bilateral hilar adenopathy, likely reactive.  4.  Aortic Atherosclerosis.   Assessment / Plan / Recommendation Clinical Impression  Pt seen today for assessment of swallowing. Pt sitting in chair in room; on [place] O2 support of 2-3L. Pt was verbally conversive and engaged easily w/ this SLP giving details of events leading up to this admit -- described a Fall at the [facility] then fatigue prior to attempting an Eye appt but not feeling well so she was brought to the ED. Pt is known to this SLP and  service; she was seen in 02/15/2020-02/17/2020 including MBSS then. She was insightful as to her dysphagia, swallowing and following her swallowing strategies and precautions as instructed during that MBSS/admit. She has been tolerating a Regular diet w/ thin liquids using her swallowing strategies and aspiration precautions per her report, and [facility] MD notes.  Reviewed results of MBSS from 02/15/2020: "chronic moderate oropharyngeal and pharyngoesophageal dysphagia secondary to late effects of XRT/resection for tonsilar cancer ~10 years ago. Overall function appears somewhat improved compared with MBS in June 2021, which showed silent aspiration of thin and nectar thick liquids, intermittently. Patient did aspirate after the swallow with larger sips of thin and nectar, and when performing liquid wash of solid, however these aspiration events elicited a protective cough response. Pharyngeal stage is characterized by reduced base of tongue retraction, resulting in residue (mild valleculae with thin, moderate with thicker liquids and solids in the valleculae, pyriforms, and along the posterior pharyngeal wall). Epiglottic inversion and airway closure is incomplete. With larger sips, liquid fills the pyriforms and spills into the laryngeal vestibule during and after the swallow, with eventual aspiration, x2. Cough response cleared the  airway. Hyolaryngeal excursion is reduced, which appears to contribute to reduced amplitude and duration of cricopharyngeal opening. Presence of CP bar noted; combined with possibly altered pharyngeal pressures, also impacts pharyngeal stripping of the bolus. Several compensatory maneuvers were attempted including dry swallow, liquids wash, chin tuck with dry swallow, and super-supraglottic maneuver.  Pt was able to demonstrate the swallowing strategies as learned/recommended from the MBSS; she stated "they have helped me swallow better". We discussed food consistencies and Prep to  include adding Minced foods esp. meats; pt stated she had no deficits the soft, cut/chopped foods she prepares at home. Discussed the grinder she uses at home to prepare some foods. Pt stated she did f/u w/ ENT/GI for further evaluation of the potential CP bar as noted on recent MBSS; discussed its possible impact on the effectiveness and safety of her swallowing as well as ability to tolerate soft/minced solid foods for her diet/nutritional needs. Encouraged pt to talk w/ Dietician as needed re: drink supplement to support her nutrition also -- she currently uses Ensure. Education and Practice using the general aspiration precautions and swallowing strategies including supraglottic swallowing during this session. No overt clinical s/s of aspiration were noted during trials of thin liquids, purees. No decline in respiratory status noted and O2 sats remaiened 97-98%. Vocal quality clear b/t trials. Pt followed her strategies appropriately. Oral phase appeared [facility] Oral cavity appeared WFL; min reduced lingual ROM. Pt fed self I. Recommend diet upgrade to [employer] w/ Minced meats -- foods moistened well w/ condiments. Thin liquids via CUP. Swallowing precautions and strategies as is Baseline for pt. Pills in Puree as is Baseline for pt(she stated she manages Pills well in puree). GERD precautions. Recommended pt f/u w/ PCP in monitoring of her Pulmonary status for any negative sequelae of aspiration; ie. Pulmonary change/decline at home then act quickly, not wait d/t her chronic dysphagia/aspiration risk. Pt denied needs for MBSS at this time stating she feels no new changes in her swallowing or speech or overall status. Dietician support as needed. Pt agreed w/ above discussion and recommendations. NSG present to update. SLP Visit Diagnosis: Dysphagia, pharyngeal phase (R13.13) (GERD)    Aspiration Risk  Mild aspiration risk;Moderate aspiration risk;Risk for inadequate nutrition/hydration    Diet  Recommendation  Mech Soft diet w/ meats Minced w/ gravies added to moisten; Thin liquids VIA CUP. Supraglottic swallowing strategy w/ f/u cough/throat clear; general aspiration precautions; GERD precautions  Medication Administration: Whole meds with puree (for safer swallowing)    Other  Recommendations Recommended Consults:  (Dietician f/u) Oral Care Recommendations: Oral care BID;Oral care before and after PO;Patient independent with oral care Other Recommendations:  (n/a)   Follow up Recommendations None      Frequency and Duration min 1 x/week  1 week       Prognosis Prognosis for Safe Diet Advancement: Fair (-Good) Barriers to Reach Goals: Time post onset;Severity of deficits Barriers/Prognosis Comment: baseline dysphagia but manages quite well      Swallow Study   General Date of Onset: 05/28/20 HPI: Pt is a 75 y.o. Caucasian female with medical history significant for breast cancer status post chemotherapy and radiotherapy 5 years ago and tonsillar cancer status post resection and radiotherapy, type 2 diabetes mellitus, hypertension, dyslipidemia, GERD, anxiety, diverticulosis and restless leg syndrome, who presented to the emergency room with acute onset of generalized weakness as well as shaking chills and presyncope today with subsequent mechanical fall without injuries.  She was with her friend  having an primary care appointment today when this happened prior to entering the office.  When her friend went to take her to eye laser surgery appointment this afternoon she was fairly unsteady more than her usual and had slight altered mental status with confusion per her friend who was with her in the ER.  She had the laser therapy and had to be placed in a wheelchair to get to the car before she came to the ER.  She was tachycardic tachypneic and hypoxic to 82% on room air when she came to the ER.  No reported chest pain or palpitations, nausea or vomiting or abdominal pain.  No  dysuria, oliguria or hematuria or flank pain.  She admits to dry cough and mild dyspnea without wheezing.  Pt has chronic Dysphagia seen 01/2020-02/2020 w/ MBSSs x2 recommending swallowing precautions and stragegies.  Pt has been maintaining a fairly Regular diet w/ thin liquids w/ no recent Pulmonary issues nor weight loss per [facility] MD notes, and pt report.  Chest CT: Bibasilar airspace disease and trace left pleural effusion, favor  pneumonia over edema.  3. Mediastinal and bilateral hilar adenopathy, likely reactive.  4.  Aortic Atherosclerosis. Type of Study: Bedside Swallow Evaluation Previous Swallow Assessment: 01/2020-02/2020; 07/2019 Diet Prior to this Study: Thin liquids;Dysphagia 1 (puree) (cut/minced regular foods when needed; moistened) Temperature Spikes Noted: No (wbc min elevated) Respiratory Status: Nasal cannula (2-3L) History of Recent Intubation: No Behavior/Cognition: Alert;Cooperative;Pleasant mood Oral Cavity Assessment: Within Functional Limits Oral Care Completed by SLP: Recent completion by staff Oral Cavity - Dentition: Adequate natural dentition (partial) Vision: Functional for self-feeding Self-Feeding Abilities: Able to feed self Patient Positioning: Upright in chair Baseline Vocal Quality: Hoarse;Low vocal intensity (min each; slight dysarthria baseline s/p lingual Ca, tx) Volitional Cough: Strong Volitional Swallow: Able to elicit    Oral/Motor/Sensory Function Overall Oral Motor/Sensory Function: Within functional limits (grossly WFL)   Ice Chips Ice chips: Not tested   Thin Liquid Thin Liquid: Within functional limits Presentation: Cup;Self Fed (7 trials) Other Comments: pt uses her swallowing strategies of breath hold, firm swallow w/ throat clear after if needed (supraglottic swallow strat.)    Nectar Thick Nectar Thick Liquid: Not tested   Honey Thick Honey Thick Liquid: Not tested   Puree Puree: Within functional limits Presentation: Self Fed;Spoon (4  trials) Other Comments: swallowing strategies; precautions   Solid     Solid: Not tested Other Comments: pt being transferred to another room/floor       Orinda Kenner, [place], [place] [phone] [facility] 05/29/2020,6:17 PM

## 2020-05-29 NOTE — Progress Notes (Signed)
Patient was admitted with septic shock due to bilateral pneumonia, requiring low-dose vasopressors.  PCCM was consulted for vasopressor requirement.  This morning she is off vasopressors, with a stable vital signs.  On antibiotics   PCCM will sign off.  Please call with questions    Jacky Kindle MD [facility] See Amion for pager If no response to pager, please call [phone] until 7pm After 7pm, Please call E-link [phone]

## 2020-05-29 NOTE — Consult Note (Signed)
NAME:  Suzanne Strong, MRN:  [id number], DOB:  1945/03/22, LOS: 1 ADMISSION DATE:  05/28/2020, CONSULTATION DATE:  4/13 REFERRING MD:  [name] Ace, CHIEF COMPLAINT:  Fall   History of Present Illness:  75 y/o female admitted with severe community acquired pneumonia developed worsening hypotension after admission so PCCM consulted for further evaluation. She has a history of breast cancer and tonsillar cancer and received radiation therapy 5 years ago.  She was going to the doctor today when she fell walking in.  She was noted to be hypoxemic and hypotensive so they sent her to the ER.  In the ER she was diagnosed with pneumonia.  A CT angiogram did not show a PE, but showed bilateral airspace disease. She says that for 1-2 days prior to admission she didn't "feel right" but can't specifically say what she thought was wrong. She knows that food frequently goes into her windpipe, she has never worked with speech therapy on this. She received 3 L IV fluids in the ER, her blood pressure remained low so norepinephrine was started.  Repeat lactic acid was down trending around the time the levophed was started.  She made urine in the ER just prior to arrival to the ICU this evening.  Pertinent  Medical History  Breast cancer treated with XRT and medical therapy DM2 Diverticulosis Gastroparesis Hypertension Hyperlipidemia Tonsillar cancer  Significant Hospital Events: Including procedures, antibiotic start and stop dates in addition to other pertinent events   . 4/12 admission to step down  Interim History / Subjective:  As above.  Objective   Blood pressure (!) 99/51, pulse 78, temperature 98.3 F (36.8 C), temperature source Oral, resp. rate (!) 24, height 5\' 6"  (1.676 m), weight 100.7 kg, SpO2 94 %.        Intake/Output Summary (Last 24 hours) at 05/29/2020 0034 Last data filed at 05/29/2020 0000 Gross per 24 hour  Intake 1542.28 ml  Output --  Net 1542.28 ml   Filed Weights    05/28/20 1646  Weight: 100.7 kg    Examination: General: Obese female, resting comfortably, hoarse voice HENT: NCAT, OP clear, neck supple Lungs: coarse crackles in bases bilaterally Cardiovascular: RRR, no mgr, pulses strong Abdomen: BS +, soft nontender Extremities: normal bulk and tone of musculature Neuro: awake, alert, moves all four extremities well Derm: no mottling, extremities are warm and well perfused GU: deferred  Labs/imaging that I havepersonally reviewed  (right click and "Reselect all SmartList Selections" daily)  Trop slightly elevated 207 Lactic acid 3.3 > 4.1 > 2.9 WBC 8 Hgb 10.2 gm/dL Urinalysis with mild pyuria, slight ketosis Cr slightly elevated at 1.22  Resolved Hospital Problem list     Assessment & Plan:  Critically ill due to Septic shock due to aspiration pneumonia> now with improved hemodynamics on low dose levophed; does not appear to be volume replete or have findings worrisome for cardiogenic shock Admit to ICU Wean off levophed for MAP > 65 Continue gentle IV fluids Agree with ceftriaxone and azithromycin  Aspiration pneumonia, hoarseness> 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 and dysphagia SLP evaluation in AM NPO  Rest as per [facility] service  Best practice (right click and "Reselect all SmartList Selections" daily)  Diet:  NPO Pain/Anxiety/Delirium protocol (if indicated): No VAP protocol (if indicated): Not indicated DVT prophylaxis: LMWH GI prophylaxis: N/A Glucose control:  SSI No Central venous access:  N/A Arterial line:  N/A Foley:  N/A Mobility:  bed rest  PT consulted: N/A Last date  of multidisciplinary goals of care discussion [4/12 per [facility]] Code Status:  full code Disposition: remain in ICU  Labs   CBC: Recent Labs  Lab 05/28/20 1129 05/28/20 1700  WBC 9.0 8.0  NEUTROABS 7.7 7.4  HGB 10.6* 10.2*  HCT 35.1* 32.7*  MCV 89.1 86.3  PLT 233 638    Basic Metabolic Panel: Recent Labs  Lab 05/28/20 1129  05/28/20 1700  NA 140 138  K 3.9 4.1  CL 103 104  CO2 25 23  GLUCOSE 181* 183*  BUN 14 15  CREATININE 1.05* 1.22*  CALCIUM 8.5* 8.5*  MG  --  1.3*   GFR: Estimated Creatinine Clearance: 49.9 mL/min (A) (by C-G formula based on SCr of 1.22 mg/dL (H)). Recent Labs  Lab 05/28/20 1129 05/28/20 1650 05/28/20 1700 05/28/20 2238  PROCALCITON  --   --  0.92  --   WBC 9.0  --  8.0  --   LATICACIDVEN  --  3.3* 4.1* 2.9*    Liver Function Tests: Recent Labs  Lab 05/28/20 1129 05/28/20 1700  AST 22 25  ALT 11 12  ALKPHOS 77 67  BILITOT 0.5 0.4  PROT 8.3* 7.3  ALBUMIN 3.9 3.2*   No results for input(s): LIPASE, AMYLASE in the last 168 hours. No results for input(s): AMMONIA in the last 168 hours.  ABG No results found for: PHART, PCO2ART, PO2ART, HCO3, TCO2, ACIDBASEDEF, O2SAT   Coagulation Profile: No results for input(s): INR, PROTIME in the last 168 hours.  Cardiac Enzymes: No results for input(s): CKTOTAL, CKMB, CKMBINDEX, TROPONINI in the last 168 hours.  HbA1C: Hemoglobin A1C  Date/Time Value Ref Range Status  12/09/2016 12:00 AM 7.3  Final    Comment:    Dr Jaye Beagle everywhere   Hgb A1c MFr Bld  Date/Time Value Ref Range Status  02/15/2020 09:27 AM 6.1 (H) 4.8 - 5.6 % Final    Comment:    (NOTE) Pre diabetes:          5.7%-6.4%  Diabetes:              >6.4%  Glycemic control for   <7.0% adults with diabetes   06/29/2017 09:02 AM 8.0 (H) 4.6 - 6.5 % Final    Comment:    Glycemic Control Guidelines for People with Diabetes:Non Diabetic:  <6%Goal of Therapy: <7%Additional Action Suggested:  >8%     CBG: Recent Labs  Lab 05/24/20 0845 05/28/20 2212  GLUCAP 108* 138*    Review of Systems:   Gen: + Fever, denies chills, weight change, fatigue, night sweats HEENT: Denies blurred vision, double vision, hearing loss, tinnitus, sinus congestion, rhinorrhea, sore throat, neck stiffness, dysphagia PULM: per HPI CV: Denies chest pain, edema,  orthopnea, paroxysmal nocturnal dyspnea, palpitations GI: Denies abdominal pain, nausea, vomiting, diarrhea, hematochezia, melena, constipation, change in bowel habits GU: Denies dysuria, hematuria, polyuria, oliguria, urethral discharge Endocrine: Denies hot or cold intolerance, polyuria, polyphagia or appetite change Derm: Denies rash, dry skin, scaling or peeling skin change Heme: Denies easy bruising, bleeding, bleeding gums Neuro: Denies headache, numbness, weakness, slurred speech, loss of memory or consciousness   Past Medical History:  She,  has a past medical history of Anemia, Anxiety, Breast cancer ([place]) (02/25/2015), Cervical dysplasia, Diabetes mellitus without complication ([place]), Diverticulosis, Fever (04/11/2015), Gastroparesis, GERD (gastroesophageal reflux disease), Hyperlipidemia, Hypertension, Neuropathy, Personal history of chemotherapy, Personal history of radiation therapy (2017), RLS (restless legs syndrome), Shingles, 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   Surgical History:  Past Surgical History:  Procedure Laterality Date  . APPENDECTOMY    . BREAST BIOPSY Left 02/25/2015   INVASIVE MAMMARY CARCINOMA,Triple negative.  [name] BREAST CYST ASPIRATION    . BREAST EXCISIONAL BIOPSY Left 12/2015   surgery  . BREAST LUMPECTOMY WITH NEEDLE LOCALIZATION Left 10/02/2015   Procedure: BREAST LUMPECTOMY WITH NEEDLE LOCALIZATION;  Surgeon: [name], MD;  Location: [facility];  Service: General;  Laterality: Left;  . BREAST LUMPECTOMY WITH SENTINEL LYMPH NODE BIOPSY Left 10/02/2015   Procedure: LEFT BREAST WIDE EXCISION WITH SENTINEL LYMPH NODE BX;  Surgeon: [name], MD;  Location: [facility];  Service: General;  Laterality: Left;  . BREAST SURGERY     breast biopsy benign  . CARPAL TUNNEL RELEASE Right 05/19/2017   Procedure: CARPAL TUNNEL RELEASE;  Surgeon: Earnestine Leys, MD;  Location: [facility];  Service: Orthopedics;  Laterality: Right;  . CATARACT  EXTRACTION W/PHACO Right 10/26/2018   Procedure: CATARACT EXTRACTION PHACO AND INTRAOCULAR LENS PLACEMENT ([place]) right diabetic;  Surgeon: Leandrew Koyanagi, MD;  Location: [place];  Service: Ophthalmology;  Laterality: Right;  Diabetic - oral meds cancer center been notified and will come  . CATARACT EXTRACTION W/PHACO Left 11/16/2018   Procedure: CATARACT EXTRACTION PHACO AND INTRAOCULAR LENS PLACEMENT ([facility]) LEFT DIABETIC  01:01.0  17.0%  10.37;  Surgeon: Leandrew Koyanagi, MD;  Location: [place];  Service: Ophthalmology;  Laterality: Left;  Diabetic - oral meds Port-a-cath  . CHOLECYSTECTOMY    . Cologuard  07/22/2016   Negative  . COLONOSCOPY WITH PROPOFOL N/A 05/24/2020   Procedure: COLONOSCOPY WITH PROPOFOL;  Surgeon: Virgel Manifold, MD;  Location: [facility];  Service: Endoscopy;  Laterality: N/A;  . ESOPHAGOGASTRODUODENOSCOPY  07/2009   normal with few gastric polyps  . ESOPHAGOGASTRODUODENOSCOPY (EGD) WITH PROPOFOL N/A 05/24/2020   Procedure: ESOPHAGOGASTRODUODENOSCOPY (EGD) WITH PROPOFOL;  Surgeon: Virgel Manifold, MD;  Location: [facility];  Service: Endoscopy;  Laterality: N/A;  . MOLE REMOVAL    . PORT-A-CATH REMOVAL    . PORTACATH PLACEMENT    . PORTACATH PLACEMENT Right 03/12/2015   Procedure: INSERTION PORT-A-CATH;  Surgeon: [name], MD;  Location: [facility];  Service: General;  Laterality: Right;  . RADICAL NECK DISSECTION    . TONSILLECTOMY     cancer treated with chemo and radiation  . TRIGGER FINGER RELEASE Right 05/19/2017   Procedure: RELEASE TRIGGER FINGER/A-1 PULLEY ring finger;  Surgeon: Earnestine Leys, MD;  Location: [facility];  Service: Orthopedics;  Laterality: Right;     Social History:   reports that she quit smoking about 36 years ago. Her smoking use included cigarettes. She has a 3.00 pack-year smoking history. She has never used smokeless tobacco. She reports that she does not drink alcohol and does not use  drugs.   Family History:  Her family history includes Alcohol abuse in her father; Cancer in her father and mother; Depression in her mother; Heart disease in her father; Hyperlipidemia in her brother and mother; Hypertension in her brother; Osteoporosis in her mother. There is no history of Breast cancer.   Allergies Allergies  Allergen Reactions  . Amoxicillin Swelling    REACTION: rash, swelling Has patient had a PCN reaction causing immediate rash, facial/tongue/throat swelling, SOB or lightheadedness with hypotension: yes Has patient had a PCN reaction causing severe rash involving mucus membranes or skin necrosis: no Has patient had a PCN reaction that required hospitalization: no Has patient had a PCN reaction occurring within the last 10  years: yes If all of the above answers are "NO", then may proceed with Cephalosporin use.  Has tolerated ceftriaxone  . Penicillin G Anaphylaxis    Has tolerated ceftriaxone on multiple occasions   . Fluconazole Rash    REACTION: hives  . Difuleron [Ferrous Fumarate-Dss]   . Other Other (See Comments)    Pt has had tonsil cancer (on Left side) - pt may have difficulty swallowing     Home Medications  Prior to Admission medications   Medication Sig Start Date End Date Taking? Authorizing Provider  albuterol (VENTOLIN HFA) 108 (90 Base) MCG/ACT inhaler INHALE TWO PUFFS BY MOUTH EVERY SIX HOURS AS NEEDED 01/09/20  Yes Tower, [name], MD  ALPRAZolam Duanne Moron) 0.5 MG tablet TAKE ONE TABLET BY MOUTH twice daily AS NEEDED FOR ANXIETY OR SLEEP 05/09/20  Yes Thayer Headings, PMHNP  amitriptyline (ELAVIL) 50 MG tablet Take 1 tablet (50 mg total) by mouth at bedtime. 12/07/16  Yes Cammie Sickle, MD  amLODipine (NORVASC) 5 MG tablet Take 1 tablet (5 mg total) by mouth daily. 05/15/20  Yes Tower, [name], MD  atorvastatin (LIPITOR) 10 MG tablet TAKE 1 TABLET(10 MG) BY MOUTH DAILY 06/02/19  Yes Tower, Marne A, MD  b complex vitamins tablet Take 1  tablet by mouth daily.   Yes [provider]  busPIRone (BUSPAR) 15 MG tablet Take 1 tablet (15 mg total) by mouth 2 (two) times daily. 02/07/20 03/08/20 Yes Thayer Headings, PMHNP  Cholecalciferol (VITAMIN D-1000 MAX ST) 25 MCG (1000 UT) tablet Take 2,000 Units by mouth daily. Takes three 50 mcg tablets, 3 capsules daily   Yes [provider]  ferrous sulfate 325 (65 FE) MG tablet Take 325 mg by mouth daily with breakfast.   Yes [provider]  glipiZIDE (GLUCOTROL) 5 MG tablet Take 5 mg by mouth 2 (two) times daily. 03/22/20  Yes [provider]  glucose blood test strip To check blood glucose once daily and prn for diabetes 10/20/19  Yes Tower, Marne A, MD  JANUMET XR 50-1000 MG TB24 Take 1 tablet by mouth 2 (two) times daily.  03/13/15  Yes [provider]  lidocaine-prilocaine (EMLA) cream Apply 1 application topically as needed. Apply to port when going through Chemo   Yes [provider]  Multiple Vitamins-Minerals (WOMENS MULTIVITAMIN PO) Take by mouth.   Yes [provider]  omeprazole (PRILOSEC) 20 MG capsule TAKE 1 CAPSULE(20 MG) BY MOUTH AT BEDTIME 06/02/19  Yes Tower, Marne A, MD  pregabalin (LYRICA) 75 MG capsule Take 1 capsule (75 mg total) by mouth daily. 02/17/20 04/17/20 Yes Adhikari, Tamsen Meek, MD  rOPINIRole (REQUIP) 1 MG tablet TAKE 1/2 TABLET BY MOUTH EVERY MORNING and TAKE THREE TABLETS BY MOUTH EVERYDAY AT BEDTIME 12/21/19  Yes Tower, Marne A, MD  sertraline (ZOLOFT) 100 MG tablet TAKE TWO TABLETS BY MOUTH EVERY MORNING 05/09/20  Yes Thayer Headings, PMHNP  Suvorexant (BELSOMRA) 20 MG TABS Take 1 tablet by mouth at bedtime. 05/03/20  Yes Thayer Headings, PMHNP  traZODone (DESYREL) 50 MG tablet Take 1/2-2 tabs po QHS prn insomnia 05/09/20  Yes Thayer Headings, PMHNP  meloxicam (MOBIC) 7.5 MG tablet Take 7.5 mg by mouth daily. Patient not taking: No sig reported 05/02/20   [provider]  Na Sulfate-K Sulfate-Mg Sulf  17.5-3.13-1.6 GM/177ML SOLN At 5 PM the day before procedure take 1 bottle and 5 hours before procedure take 1 bottle. Patient not taking: No sig reported 04/17/20   Virgel Manifold,  MD     Critical care time: 30 minutes    Roselie Awkward, MD [place] [facility] Pager: [phone] Cell: [phone] If no response, please call [phone] until 7pm After 7:00 pm call Elink  [phone]

## 2020-05-29 NOTE — Progress Notes (Addendum)
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 -In the emergency room she was tachycardic and tachypneic, hypoxic with sats of 82%, labs revealed leukocytosis, chest x-ray noted hazy pulmonary airspace disease, CT chest noted by bibasilar airspace disease favoring pneumonia with reactive adenopathy   Assessment & Plan:   Aspiration pneumonia Septic shock poa -Clinically improving on ceftriaxone/zithromax -Continue duo nebs, mucolytic's -Follow-up blood and sputum cultures -SLP evaluation -Off pressors, transfer out of ICU today  Elevated troponin -Secondary to demand ischemia, clinically do not suspect ACS  Acute on chronic anemia -Continue to trend  Asymptomatic pyuria -Follow-up urine culture  Type 2 diabetes mellitus -Currently on glipizide and NovoLog sliding scale  Essential hypertension -Antihypertensives on hold, resume in 1 to 2 days  Anxiety, depression -Continued home regimen of BuSpar, Xanax, Zoloft and trazodone  DVT prophylaxis: Lovenox Code Status: Full code Family Communication: No family at bedside Disposition Plan:  Status is: Inpatient  Remains inpatient appropriate because:Inpatient level of care appropriate due to severity of illness   Dispo: The patient is from: Home              Anticipated d/c is to: Home              Patient currently is not medically stable to d/c.   Difficult to place patient No  Consultants:   PCCM   Procedures:   Antimicrobials:    Subjective: -Feels better today,  has productive cough today  Objective: Vitals:   05/29/20 1155 05/29/20 1200 05/29/20 1300 05/29/20 1516  BP:  135/63 (!) 114/53 (!) 150/66  Pulse:  81 78 84  Resp:  17 18 16   Temp: (!) 96.9 F (36.1 C)   98.7 F (37.1 C)  TempSrc: Axillary   Oral  SpO2:  98% 96% 90%  Weight:      Height:        Intake/Output Summary (Last 24 hours) at 05/29/2020 1738 Last data filed at 05/29/2020 1300 Gross per 24 hour  Intake 3713.08 ml  Output 950 ml  Net 2763.08 ml   Filed Weights   05/28/20 1646  Weight: 100.7 kg    Examination:  General exam: Pleasant elderly female sitting up in bed, awake alert oriented x3, no distress HEENT: No JVD CVS: S1-S2, regular rate rhythm Lungs: Scattered rhonchi at both bases Abdomen: Soft, nontender, bowel sounds present Extremities: No edema Skin: No rashes on exposed skin   Data Reviewed:   CBC: Recent Labs  Lab 05/28/20 1129 05/28/20 1700 05/29/20 0300  WBC 9.0 8.0 13.5*  NEUTROABS 7.7 7.4  --   HGB 10.6* 10.2* 8.4*  HCT 35.1* 32.7* 27.6*  MCV 89.1 86.3 87.6  PLT 233 220 449   Basic Metabolic Panel: Recent Labs  Lab 05/28/20 1129 05/28/20 1700 05/29/20 0300  NA 140 138 139  K 3.9 4.1 4.4  CL 103 104 108  CO2 25 23 23   GLUCOSE 181* 183* 156*  BUN 14 15 17   CREATININE 1.05* 1.22* 1.06*  CALCIUM 8.5* 8.5* 7.1*  MG  --  1.3* 2.6*   GFR: Estimated Creatinine Clearance:  57.5 mL/min (A) (by C-G formula based on SCr of 1.06 mg/dL (H)). Liver Function Tests: Recent Labs  Lab 05/28/20 1129 05/28/20 1700  AST 22 25  ALT 11 12  ALKPHOS 77 67  BILITOT 0.5 0.4  PROT 8.3* 7.3  ALBUMIN 3.9 3.2*   No results for input(s): LIPASE, AMYLASE in the last 168 hours. No results for input(s): AMMONIA in the last 168 hours. Coagulation Profile: No results for input(s): INR, PROTIME in the last 168 hours. Cardiac Enzymes: No results for input(s): CKTOTAL, CKMB, CKMBINDEX, TROPONINI in the last 168 hours. BNP (last 3 results) No  results for input(s): PROBNP in the last 8760 hours. HbA1C: Recent Labs    05/28/20 2238  HGBA1C 6.7*   CBG: Recent Labs  Lab 05/24/20 0845 05/28/20 2212 05/29/20 0722 05/29/20 1143 05/29/20 1615  GLUCAP 108* 138* 117* 125* 92   Lipid Profile: No results for input(s): CHOL, HDL, LDLCALC, TRIG, CHOLHDL, LDLDIRECT in the last 72 hours. Thyroid Function Tests: No results for input(s): TSH, T4TOTAL, FREET4, T3FREE, THYROIDAB in the last 72 hours. Anemia Panel: No results for input(s): VITAMINB12, FOLATE, FERRITIN, TIBC, IRON, RETICCTPCT in the last 72 hours. Urine analysis:    Component Value Date/Time   COLORURINE YELLOW (A) 05/28/2020 1650   APPEARANCEUR HAZY (A) 05/28/2020 1650   APPEARANCEUR Hazy 11/02/2011 1708   LABSPEC 1.016 05/28/2020 1650   LABSPEC 1.025 11/02/2011 1708   PHURINE 6.0 05/28/2020 1650   GLUCOSEU 150 (A) 05/28/2020 1650   GLUCOSEU 50 mg/dL 11/02/2011 1708   HGBUR NEGATIVE 05/28/2020 1650   HGBUR negative 11/26/2009 1200   BILIRUBINUR NEGATIVE 05/28/2020 1650   BILIRUBINUR 1 mg/dL 01/05/2020 0921   BILIRUBINUR Negative 11/02/2011 1708   KETONESUR 5 (A) 05/28/2020 1650   PROTEINUR NEGATIVE 05/28/2020 1650   UROBILINOGEN 0.2 01/05/2020 0921   UROBILINOGEN 0.2 11/26/2009 1200   NITRITE NEGATIVE 05/28/2020 1650   LEUKOCYTESUR TRACE (A) 05/28/2020 1650   LEUKOCYTESUR Trace 11/02/2011 1708   Sepsis Labs: @LABRCNTIP (procalcitonin:4,lacticidven:4)  ) Recent Results (from the past 240 hour(s))  SARS CORONAVIRUS 2 (TAT 6-24 HRS) Nasopharyngeal Nasopharyngeal Swab     Status: None   Collection Time: 05/22/20 10:48 AM   Specimen: Nasopharyngeal Swab  Result Value Ref Range Status   SARS Coronavirus 2 NEGATIVE NEGATIVE Final    Comment: (NOTE) SARS-CoV-2 target nucleic acids are NOT DETECTED.  The SARS-CoV-2 RNA is generally detectable in upper and lower respiratory specimens during the acute phase of infection. Negative results do not preclude  SARS-CoV-2 infection, do not rule out co-infections with other pathogens, and should not be used as the sole basis for treatment or other patient management decisions. Negative results must be combined with clinical observations, patient history, and epidemiological information. The expected result is Negative.  Fact Sheet for Patients: SugarRoll.be  Fact Sheet for Healthcare Providers: https://www.woods-mathews.com/  This test is not yet approved or cleared by the [place] FDA and  has been authorized for detection and/or diagnosis of SARS-CoV-2 by FDA under an Emergency Use Authorization (EUA). This EUA will remain  in effect (meaning this test can be used) for the duration of the COVID-19 declaration under Se ction 564(b)(1) of the Act, 21 U.S.C. section 360bbb-3(b)(1), unless the authorization is terminated or revoked sooner.  Performed at [facility], [place] [address], [place], [place] [address]   Culture, sputum-assessment     Status: None   Collection Time: 05/28/20  1:35 PM   Specimen: Expectorated Sputum  Result Value Ref  Range Status   Specimen Description EXPECTORATED SPUTUM  Final   Special Requests NONE  Final   Sputum evaluation   Final    THIS SPECIMEN IS ACCEPTABLE FOR SPUTUM CULTURE Performed at [facility], [address] [place], [place] [address]    Report Status 05/29/2020 FINAL  Final  Culture, blood (routine x 2)     Status: None (Preliminary result)   Collection Time: 05/28/20  5:00 PM   Specimen: BLOOD  Result Value Ref Range Status   Specimen Description BLOOD RIGHT ANTECUBITAL  Final   Special Requests   Final    BOTTLES DRAWN AEROBIC AND ANAEROBIC Blood Culture adequate volume   Culture   Final    NO GROWTH < 12 HOURS Performed at [facility], [address] [place], [place] [address]    Report Status PENDING  Incomplete  Culture, blood (routine x 2)     Status: None  (Preliminary result)   Collection Time: 05/28/20  5:00 PM   Specimen: BLOOD  Result Value Ref Range Status   Specimen Description BLOOD LEFT ANTECUBITAL  Final   Special Requests   Final    BOTTLES DRAWN AEROBIC AND ANAEROBIC Blood Culture adequate volume   Culture   Final    NO GROWTH < 12 HOURS Performed at [facility], [address], [place], [place] [address]    Report Status PENDING  Incomplete  Resp Panel by RT-PCR (Flu A&B, Covid) Nasopharyngeal Swab     Status: None   Collection Time: 05/28/20  5:00 PM   Specimen: Nasopharyngeal Swab; Nasopharyngeal(NP) swabs in vial transport medium  Result Value Ref Range Status   SARS Coronavirus 2 by RT PCR NEGATIVE NEGATIVE Final    Comment: (NOTE) SARS-CoV-2 target nucleic acids are NOT DETECTED.  The SARS-CoV-2 RNA is generally detectable in upper respiratory specimens during the acute phase of infection. The lowest concentration of SARS-CoV-2 viral copies this assay can detect is 138 copies/mL. A negative result does not preclude SARS-Cov-2 infection and should not be used as the sole basis for treatment or other patient management decisions. A negative result may occur with  improper specimen collection/handling, submission of specimen other than nasopharyngeal swab, presence of viral mutation(s) within the areas targeted by this assay, and inadequate number of viral copies(<138 copies/mL). A negative result must be combined with clinical observations, patient history, and epidemiological information. The expected result is Negative.  Fact Sheet for Patients:  EntrepreneurPulse.com.au  Fact Sheet for Healthcare Providers:  IncredibleEmployment.be  This test is no t yet approved or cleared by the [place] FDA and  has been authorized for detection and/or diagnosis of SARS-CoV-2 by FDA under an Emergency Use Authorization (EUA). This EUA will remain  in effect (meaning this  test can be used) for the duration of the COVID-19 declaration under Section 564(b)(1) of the Act, 21 U.S.C.section 360bbb-3(b)(1), unless the authorization is terminated  or revoked sooner.       Influenza A by PCR NEGATIVE NEGATIVE Final   Influenza B by PCR NEGATIVE NEGATIVE Final    Comment: (NOTE) The Xpert Xpress SARS-CoV-2/FLU/RSV plus assay is intended as an aid in the diagnosis of influenza from Nasopharyngeal swab specimens and should not be used as a sole basis for treatment. Nasal washings and aspirates are unacceptable for Xpert Xpress SARS-CoV-2/FLU/RSV testing.  Fact Sheet for Patients: EntrepreneurPulse.com.au  Fact Sheet for Healthcare Providers: IncredibleEmployment.be  This test is not yet approved or cleared by the [place]  FDA and has been authorized for detection and/or diagnosis of SARS-CoV-2 by FDA under an Emergency Use Authorization (EUA). This EUA will remain in effect (meaning this test can be used) for the duration of the COVID-19 declaration under Section 564(b)(1) of the Act, 21 U.S.C. section 360bbb-3(b)(1), unless the authorization is terminated or revoked.  Performed at [facility], [address] [place], [place] [address]   MRSA PCR Screening     Status: None   Collection Time: 05/28/20 10:09 PM   Specimen: Nasopharyngeal  Result Value Ref Range Status   MRSA by PCR NEGATIVE NEGATIVE Final    Comment:        The GeneXpert MRSA Assay (FDA approved for NASAL specimens only), is one component of a comprehensive MRSA colonization surveillance program. It is not intended to diagnose MRSA infection nor to guide or monitor treatment for MRSA infections. Performed at [facility], [address] [place], [place] [address]          Radiology Studies: CT Angio Chest PE W and/or Wo Contrast  Result Date: 05/28/2020 CLINICAL DATA:  Acute shortness of breath, elevated  D-dimer, history of left breast cancer EXAM: CT ANGIOGRAPHY CHEST WITH CONTRAST TECHNIQUE: Multidetector CT imaging of the chest was performed using the standard protocol during bolus administration of intravenous contrast. Multiplanar CT image reconstructions and MIPs were obtained to evaluate the vascular anatomy. CONTRAST:  72mL OMNIPAQUE IOHEXOL 350 MG/ML SOLN COMPARISON:  05/28/2020 FINDINGS: Cardiovascular: This is a technically adequate evaluation of the pulmonary vasculature. No filling defects or pulmonary emboli. The heart is unremarkable without pericardial effusion. No evidence of thoracic aortic aneurysm or dissection. Moderate atherosclerosis of the aortic arch and descending thoracic aorta. Mediastinum/Nodes: Mediastinal and bilateral hilar adenopathy are seen. Subcarinal adenopathy and precarinal adenopathy measure up to 12 mm in short axis. Thyroid, trachea, and esophagus are unremarkable. Lungs/Pleura: There is bibasilar airspace disease greatest within the lower lobes. Trace left pleural fluid. No pneumothorax. Central airways are patent. Upper Abdomen: No acute abnormality. Musculoskeletal: No acute or destructive bony lesions. Reconstructed images demonstrate no additional findings. Review of the MIP images confirms the above findings. IMPRESSION: 1. No evidence of pulmonary embolus. 2. Bibasilar airspace disease and trace left pleural effusion, favor pneumonia over edema. 3. Mediastinal and bilateral hilar adenopathy, likely reactive. 4.  Aortic Atherosclerosis (ICD10-I70.0). Electronically Signed   By: Randa Ngo M.D.   On: 05/28/2020 18:51   DG Chest Portable 1 View  Result Date: 05/28/2020 CLINICAL DATA:  Shortness of breath EXAM: PORTABLE CHEST 1 VIEW COMPARISON:  02/14/2020 FINDINGS: Cardiomegaly with vascular congestion and hazy pulmonary airspace disease, possible edema. More patchy confluent airspace disease at the bases. No pleural effusion or pneumothorax. IMPRESSION:  Cardiomegaly with vascular congestion and hazy pulmonary airspace disease, possible edema. Patchy confluent airspace disease at the bases may reflect atelectasis or pneumonia. Electronically Signed   By: Donavan Foil M.D.   On: 05/28/2020 18:09        Scheduled Meds: . atorvastatin  10 mg Oral Daily  . B-complex with vitamin C  1 tablet Oral Daily  . busPIRone  15 mg Oral BID  . Chlorhexidine Gluconate Cloth  6 each Topical Daily  . cholecalciferol  2,000 Units Oral Daily  . enoxaparin (LOVENOX) injection  50 mg Subcutaneous Q24H  . ferrous sulfate  325 mg Oral Q breakfast  . insulin aspart  0-15 Units Subcutaneous TID AC & HS  . ipratropium-albuterol  3 mL Nebulization BID  .  mouth rinse  15 mL Mouth Rinse BID  . pantoprazole (PROTONIX) IV  40 mg Intravenous Q24H  . pregabalin  75 mg Oral Daily  . rOPINIRole  0.5 mg Oral Daily  . rOPINIRole  3 mg Oral QHS  . sertraline  200 mg Oral q morning  . Suvorexant  1 tablet Oral QHS   Continuous Infusions: . sodium chloride Stopped (05/28/20 2338)  . azithromycin Stopped (05/28/20 2246)  . cefTRIAXone (ROCEPHIN)  IV Stopped (05/28/20 2115)  . methocarbamol (ROBAXIN) IV       LOS: 1 day    Time spent: 22min  Domenic Polite, MD [facility] 05/29/2020, 5:38 PM

## 2020-05-30 LAB — BASIC METABOLIC PANEL
Anion gap: 9 (ref 5–15)
BUN: 12 mg/dL (ref 8–23)
CO2: 23 mmol/L (ref 22–32)
Calcium: 8 mg/dL — ABNORMAL LOW (ref 8.9–10.3)
Chloride: 105 mmol/L (ref 98–111)
Creatinine, Ser: 0.79 mg/dL (ref 0.44–1.00)
GFR, Estimated: >60 mL/min (ref >60)
Glucose, Bld: 124 mg/dL — ABNORMAL HIGH (ref 70–99)
Potassium: 4.3 mmol/L (ref 3.5–5.1)
Sodium: 137 mmol/L (ref 135–145)

## 2020-05-30 LAB — IRON AND TIBC
Iron: 23 ug/dL — ABNORMAL LOW (ref 28–170)
Saturation Ratios: 7 % — ABNORMAL LOW (ref 10.4–31.8)
TIBC: 322 ug/dL (ref 250–450)
UIBC: 299 ug/dL

## 2020-05-30 LAB — CBC
HCT: 25.9 % — ABNORMAL LOW (ref 36.0–46.0)
Hemoglobin: 8.1 g/dL — ABNORMAL LOW (ref 12.0–15.0)
MCH: 26.9 pg (ref 26.0–34.0)
MCHC: 31.3 g/dL (ref 30.0–36.0)
MCV: 86 fL (ref 80.0–100.0)
Platelets: 187 10*3/uL (ref 150–400)
RBC: 3.01 MIL/uL — ABNORMAL LOW (ref 3.87–5.11)
RDW: 16 % — ABNORMAL HIGH (ref 11.5–15.5)
WBC: 8.5 10*3/uL (ref 4.0–10.5)
nRBC: 0 % (ref 0.0–0.2)

## 2020-05-30 LAB — FOLATE: Folate: 12.7 ng/mL (ref >5.9)

## 2020-05-30 LAB — GLUCOSE, CAPILLARY
Glucose-Capillary: 111 mg/dL — ABNORMAL HIGH (ref 70–99)
Glucose-Capillary: 129 mg/dL — ABNORMAL HIGH (ref 70–99)
Glucose-Capillary: 88 mg/dL (ref 70–99)
Glucose-Capillary: 105 mg/dL — ABNORMAL HIGH (ref 70–99)

## 2020-05-30 LAB — RETICULOCYTES
Immature Retic Fract: 22.8 % — ABNORMAL HIGH (ref 2.3–15.9)
RBC.: 2.99 MIL/uL — ABNORMAL LOW (ref 3.87–5.11)
Retic Count, Absolute: 61 10*3/uL (ref 19.0–186.0)
Retic Ct Pct: 2 % (ref 0.4–3.1)

## 2020-05-30 LAB — FERRITIN: Ferritin: 73 ng/mL (ref 11–307)

## 2020-05-30 LAB — PROCALCITONIN: Procalcitonin: 4.01 ng/mL

## 2020-05-30 LAB — LEGIONELLA PNEUMOPHILA SEROGP 1 UR AG: L. pneumophila Serogp 1 Ur Ag: NEGATIVE

## 2020-05-30 LAB — VITAMIN B12: Vitamin B-12: 218 pg/mL (ref 180–914)

## 2020-05-30 MED ORDER — AMLODIPINE BESYLATE 5 MG PO TABS
5.0000 mg | ORAL_TABLET | Freq: Every day | ORAL | Status: DC
  Administered 2020-05-30 – 2020-05-31 (×2): 5 mg via ORAL
  Filled 2020-05-30 (×2): qty 1

## 2020-05-30 MED ORDER — FERROUS SULFATE 325 (65 FE) MG PO TABS: 325.0000 mg | ORAL_TABLET | Freq: Two times a day (BID) | ORAL | Status: DC

## 2020-05-30 MED ORDER — ACETAMINOPHEN 325 MG PO TABS
650.0000 mg | ORAL_TABLET | Freq: Four times a day (QID) | ORAL | Status: AC | PRN
Start: 2020-05-30 — End: ?

## 2020-05-30 MED ORDER — SODIUM CHLORIDE 0.9 % IV SOLN
510.0000 mg | Freq: Once | INTRAVENOUS | Status: AC
  Administered 2020-05-30: 19:00:00 510 mg via INTRAVENOUS
  Filled 2020-05-30: qty 17

## 2020-05-30 MED ORDER — PANTOPRAZOLE SODIUM 40 MG PO TBEC
40.0000 mg | DELAYED_RELEASE_TABLET | Freq: Every day | ORAL | Status: DC
  Administered 2020-05-30: 40 mg via ORAL
  Filled 2020-05-30: qty 1

## 2020-05-30 MED ORDER — CEFDINIR 300 MG PO CAPS
300.0000 mg | ORAL_CAPSULE | Freq: Two times a day (BID) | ORAL | Status: DC
  Administered 2020-05-30 – 2020-05-31 (×2): 300 mg via ORAL
  Filled 2020-05-30 (×2): qty 1

## 2020-05-30 MED ORDER — CEFDINIR 300 MG PO CAPS
300.0000 mg | ORAL_CAPSULE | Freq: Two times a day (BID) | ORAL | 0 refills | Status: DC
Start: 2020-05-30 — End: 2020-05-31

## 2020-05-30 NOTE — Progress Notes (Signed)
PHARMACIST - PHYSICIAN COMMUNICATION  DR:   Broadus John  CONCERNING: IV to Oral Route Change Policy  RECOMMENDATION: This patient is receiving pantoprazole by the intravenous route.  Based on criteria approved by the Pharmacy and Therapeutics Committee, the intravenous medication(s) is/are being converted to the equivalent oral dose form(s).   DESCRIPTION: These criteria include:  The patient is eating (either orally or via tube) and/or has been taking other orally administered medications for a least 24 hours  The patient has no evidence of active gastrointestinal bleeding or impaired GI absorption (gastrectomy, short bowel, patient on TNA or NPO).  If you have questions about this conversion, please contact the Pharmacy Department  []   [phone] )  [facility] [x]   [phone] )  [facility] []   [phone] )  [facility] []   [phone] )  [facility] []   [phone] )  [place], [facility] 05/30/2020 10:33 AM

## 2020-05-30 NOTE — Progress Notes (Signed)
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 -In the emergency room she was tachycardic and tachypneic, hypoxic with sats of 82%, labs revealed leukocytosis, chest x-ray noted hazy pulmonary airspace disease, CT chest noted by bibasilar airspace disease favoring pneumonia with reactive adenopathy   Assessment & Plan:   Aspiration pneumonia Septic shock poa -Clinically improving on ceftriaxone/zithromax -Will switch to oral cefdinir today -SLP evaluation completed, dysphagia felt to be stable, recommended dysphagia 3 diet -Ambulate, PT OT, discharge planning  Elevated troponin -Secondary to demand ischemia, clinically do not suspect ACS  Iron deficiency anemia -Give IV iron x1 now  Asymptomatic pyuria -On antibiotics regardless for above  Type 2 diabetes mellitus -CBGs are controlled, currently on glipizide and NovoLog sliding scale  Essential hypertension -Antihypertensives on hold, resume amlodipine  Anxiety, depression -Continued home regimen of BuSpar, Xanax, Zoloft and trazodone  DVT prophylaxis: Lovenox Code Status: Full code Family Communication: No family at bedside Disposition Plan:  Status is: Inpatient  Remains inpatient appropriate because:Inpatient level of care appropriate due to severity of illness   Dispo: The patient is from: Home              Anticipated d/c is to: Home tomorrow              Patient currently is not medically stable to d/c.   Difficult to place patient No  Consultants:    PCCM   Procedures:   Antimicrobials:    Subjective: -Feels better this morning, denies any specific complaints, breathing is improving, mild intermittent cough  Objective: Vitals:   05/29/20 2142 05/30/20 0408 05/30/20 0821 05/30/20 1244  BP: (!) 157/69 133/64 (!) 149/66 (!) 140/124  Pulse: 93 89 87 86  Resp: 14 18 18 17   Temp: 98.2 F (36.8 C) 98.2 F (36.8 C) 98.2 F (36.8 C) (!) 97.3 F (36.3 C)  TempSrc:   Oral   SpO2: 92% 93% (!) 87% 90%  Weight:      Height:        Intake/Output Summary (Last 24 hours) at 05/30/2020 1558 Last data filed at 05/30/2020 0115 Gross per 24 hour  Intake 690 ml  Output --  Net 690 ml   Filed Weights   05/28/20 1646  Weight: 100.7 kg    Examination:  General exam: Pleasant elderly female sitting up in bed, awake alert oriented x3, no distress HEENT: No JVD CVS: S1-S2, regular rate rhythm Lungs: Scattered rhonchi at both bases Abdomen: Soft, nontender, bowel sounds present Extremities: No edema Skin: No rashes on exposed skin   Data Reviewed:   CBC: Recent Labs  Lab 05/28/20 1129 05/28/20 1700 05/29/20 0300 05/30/20 0425  WBC 9.0 8.0 13.5* 8.5  NEUTROABS 7.7 7.4  --   --   HGB 10.6* 10.2* 8.4* 8.1*  HCT 35.1* 32.7* 27.6* 25.9*  MCV 89.1 86.3 87.6 86.0  PLT 233 220 188 426   Basic Metabolic Panel: Recent Labs  Lab 05/28/20 1129 05/28/20 1700 05/29/20 0300 05/30/20 0425  NA 140 138 139 137  K 3.9 4.1 4.4 4.3  CL 103 104 108 105  CO2 25 23 23 23   GLUCOSE 181* 183* 156* 124*  BUN 14 15 17 12   CREATININE 1.05* 1.22* 1.06* 0.79  CALCIUM 8.5* 8.5* 7.1* 8.0*  MG  --  1.3* 2.6*  --    GFR: Estimated Creatinine Clearance: 76.2 mL/min (by C-G formula based on SCr of 0.79 mg/dL). Liver Function Tests: Recent Labs  Lab 05/28/20 1129 05/28/20 1700  AST 22 25  ALT 11 12  ALKPHOS 77 67  BILITOT 0.5 0.4  PROT 8.3* 7.3  ALBUMIN 3.9 3.2*   No results for input(s): LIPASE, AMYLASE in the last 168 hours. No  results for input(s): AMMONIA in the last 168 hours. Coagulation Profile: No results for input(s): INR, PROTIME in the last 168 hours. Cardiac Enzymes: No results for input(s): CKTOTAL, CKMB, CKMBINDEX, TROPONINI in the last 168 hours. BNP (last 3 results) No results for input(s): PROBNP in the last 8760 hours. HbA1C: Recent Labs    05/28/20 2238  HGBA1C 6.7*   CBG: Recent Labs  Lab 05/29/20 1143 05/29/20 1615 05/29/20 2246 05/30/20 0819 05/30/20 1242  GLUCAP 125* 92 93 111* 129*   Lipid Profile: No results for input(s): CHOL, HDL, LDLCALC, TRIG, CHOLHDL, LDLDIRECT in the last 72 hours. Thyroid Function Tests: No results for input(s): TSH, T4TOTAL, FREET4, T3FREE, THYROIDAB in the last 72 hours. Anemia Panel: Recent Labs    05/30/20 0425  FOLATE 12.7  FERRITIN 73  TIBC 322  IRON 23*  RETICCTPCT 2.0   Urine analysis:    Component Value Date/Time   COLORURINE YELLOW (A) 05/28/2020 1650   APPEARANCEUR HAZY (A) 05/28/2020 1650   APPEARANCEUR Hazy 11/02/2011 1708   LABSPEC 1.016 05/28/2020 1650   LABSPEC 1.025 11/02/2011 1708   PHURINE 6.0 05/28/2020 1650   GLUCOSEU 150 (A) 05/28/2020 1650   GLUCOSEU 50 mg/dL 11/02/2011 1708   HGBUR NEGATIVE 05/28/2020 1650   HGBUR negative 11/26/2009 1200   BILIRUBINUR NEGATIVE 05/28/2020 1650   BILIRUBINUR 1 mg/dL 01/05/2020 0921   BILIRUBINUR Negative 11/02/2011 1708   KETONESUR 5 (A) 05/28/2020 1650   PROTEINUR NEGATIVE 05/28/2020 1650   UROBILINOGEN 0.2 01/05/2020 0921   UROBILINOGEN 0.2 11/26/2009 1200   NITRITE NEGATIVE 05/28/2020 1650   LEUKOCYTESUR TRACE (A) 05/28/2020 1650   LEUKOCYTESUR Trace 11/02/2011 1708   Sepsis Labs: @LABRCNTIP (procalcitonin:4,lacticidven:4)  ) Recent Results (from the past 240 hour(s))  SARS CORONAVIRUS 2 (TAT 6-24 HRS) Nasopharyngeal Nasopharyngeal Swab     Status: None   Collection Time: 05/22/20 10:48 AM   Specimen: Nasopharyngeal Swab  Result Value Ref Range Status   SARS  Coronavirus 2 NEGATIVE NEGATIVE Final    Comment: (NOTE) SARS-CoV-2 target nucleic acids are NOT DETECTED.  The SARS-CoV-2 RNA is generally detectable in upper and lower respiratory specimens during the acute phase of infection. Negative results do not preclude SARS-CoV-2 infection, do not rule out co-infections with other pathogens, and should not be used as the sole basis for treatment or other patient management decisions. Negative results must be combined with clinical observations, patient history, and epidemiological information. The expected result is Negative.  Fact Sheet for Patients: SugarRoll.be  Fact Sheet for Healthcare Providers: https://www.woods-mathews.com/  This test is not yet approved or cleared by the [place] FDA and  has been authorized for detection and/or diagnosis of SARS-CoV-2 by FDA under an Emergency Use Authorization (EUA). This EUA will remain  in effect (meaning this test can be used) for the duration of the COVID-19 declaration under Se ction 564(b)(1) of the Act, 21  U.S.C. section 360bbb-3(b)(1), unless the authorization is terminated or revoked sooner.  Performed at [facility], [place] [address], [place], [place] [address]   Culture, sputum-assessment     Status: None   Collection Time: 05/28/20  1:35 PM   Specimen: Expectorated Sputum  Result Value Ref Range Status   Specimen Description EXPECTORATED SPUTUM  Final   Special Requests NONE  Final   Sputum evaluation   Final    THIS SPECIMEN IS ACCEPTABLE FOR SPUTUM CULTURE Performed at [facility], [address], [place], [place] [address]    Report Status 05/29/2020 FINAL  Final  Culture, Respiratory w Gram Stain     Status: None (Preliminary result)   Collection Time: 05/28/20  1:35 PM  Result Value Ref Range Status   Specimen Description   Final    EXPECTORATED SPUTUM Performed at [facility], [address] [place], [place] [address]    Special Requests   Final    NONE Reflexed from [id number] Performed at [facility], [address] [place], [place] [address]    Gram Stain PENDING  Incomplete   Culture   Final    FEW STREPTOCOCCUS PNEUMONIAE CULTURE REINCUBATED FOR BETTER GROWTH Performed at [facility], [place] [address] [place], [place] [address]    Report Status PENDING  Incomplete  Culture, blood (routine x 2)     Status: None (Preliminary result)   Collection Time: 05/28/20  5:00 PM   Specimen: BLOOD  Result Value Ref Range Status   Specimen Description BLOOD RIGHT ANTECUBITAL  Final   Special Requests   Final    BOTTLES DRAWN AEROBIC AND ANAEROBIC Blood Culture adequate volume   Culture   Final    NO GROWTH 2 DAYS Performed at [facility], [address], [place], [place] [address]    Report Status PENDING  Incomplete  Culture, blood (routine x 2)     Status: None (Preliminary result)   Collection Time: 05/28/20  5:00 PM   Specimen: BLOOD  Result Value Ref Range Status   Specimen Description BLOOD LEFT ANTECUBITAL  Final   Special Requests   Final    BOTTLES DRAWN AEROBIC AND ANAEROBIC Blood Culture adequate volume   Culture   Final    NO GROWTH 2 DAYS Performed at [facility], [address], [place],  [address]    Report Status PENDING  Incomplete  Resp Panel by RT-PCR (Flu A&B, Covid) Nasopharyngeal Swab     Status: None   Collection Time: 05/28/20  5:00 PM   Specimen: Nasopharyngeal Swab; Nasopharyngeal(NP) swabs in vial transport medium  Result Value Ref Range Status   SARS Coronavirus 2 by RT PCR NEGATIVE NEGATIVE Final    Comment: (NOTE) SARS-CoV-2 target nucleic acids are NOT DETECTED.  The SARS-CoV-2 RNA is generally detectable in upper respiratory specimens during the acute phase of infection. The lowest concentration of SARS-CoV-2 viral copies this assay can detect is 138 copies/mL. A negative result does not  preclude SARS-Cov-2 infection and should not be used as the sole basis for treatment or other patient management decisions. A negative result may occur with  improper specimen collection/handling, submission of specimen other than nasopharyngeal swab, presence of viral mutation(s) within the areas targeted by this assay, and inadequate number of viral copies(<138 copies/mL). A negative result must be combined with clinical observations, patient history, and epidemiological information. The expected result is Negative.  Fact Sheet for Patients:  EntrepreneurPulse.com.au  Fact Sheet for Healthcare Providers:  IncredibleEmployment.be  This test is no t yet approved or cleared by the [place] FDA and  has been authorized for detection and/or diagnosis of SARS-CoV-2 by FDA under an Emergency Use Authorization (EUA). This EUA will remain  in effect (meaning this test can be used) for the duration of the COVID-19 declaration under Section 564(b)(1) of the Act, 21 U.S.C.section 360bbb-3(b)(1), unless the authorization is terminated  or revoked sooner.       Influenza A by PCR NEGATIVE NEGATIVE Final   Influenza B by PCR NEGATIVE NEGATIVE Final    Comment: (NOTE) The Xpert Xpress SARS-CoV-2/FLU/RSV plus assay is intended as an aid in the diagnosis of influenza from Nasopharyngeal swab specimens and should not be used as a sole basis for treatment. Nasal washings and aspirates are unacceptable for Xpert Xpress SARS-CoV-2/FLU/RSV testing.  Fact Sheet for Patients: EntrepreneurPulse.com.au  Fact Sheet for Healthcare Providers: IncredibleEmployment.be  This test is not yet approved or cleared by the [place] FDA and has been authorized for detection and/or diagnosis of SARS-CoV-2 by FDA under an Emergency Use Authorization (EUA). This EUA will remain in effect (meaning this test can be used) for the  duration of the COVID-19 declaration under Section 564(b)(1) of the Act, 21 U.S.C. section 360bbb-3(b)(1), unless the authorization is terminated or revoked.  Performed at [facility], [place] [address] [place], [place] [address]   MRSA PCR Screening     Status: None   Collection Time: 05/28/20 10:09 PM   Specimen: Nasopharyngeal  Result Value Ref Range Status   MRSA by PCR NEGATIVE NEGATIVE Final    Comment:        The GeneXpert MRSA Assay (FDA approved for NASAL specimens only), is one component of a comprehensive MRSA colonization surveillance program. It is not intended to diagnose MRSA infection nor to guide or monitor treatment for MRSA infections. Performed at [facility], [address] [place], [place] [address]          Radiology Studies: CT Angio Chest PE W and/or Wo Contrast  Result Date: 05/28/2020 CLINICAL DATA:  Acute shortness of breath, elevated D-dimer, history of left breast cancer EXAM: CT ANGIOGRAPHY CHEST WITH CONTRAST TECHNIQUE: Multidetector CT imaging of the chest was performed using the standard protocol during bolus administration of intravenous contrast. Multiplanar CT image reconstructions and MIPs were obtained to evaluate the vascular anatomy. CONTRAST:  3mL OMNIPAQUE IOHEXOL 350 MG/ML SOLN COMPARISON:  05/28/2020 FINDINGS: Cardiovascular: This is a technically adequate evaluation of the pulmonary vasculature. No filling defects or pulmonary emboli. The heart is unremarkable without pericardial effusion. No evidence of thoracic aortic aneurysm or dissection. Moderate atherosclerosis of the aortic arch and descending thoracic aorta. Mediastinum/Nodes: Mediastinal and bilateral hilar adenopathy are seen. Subcarinal adenopathy and precarinal adenopathy measure up to 12 mm in short axis. Thyroid, trachea, and esophagus are unremarkable. Lungs/Pleura: There is bibasilar airspace disease greatest within the lower lobes. Trace left  pleural fluid. No pneumothorax. Central airways are patent. Upper Abdomen: No acute abnormality. Musculoskeletal: No acute or destructive bony lesions. Reconstructed images demonstrate no additional findings. Review of the MIP images confirms the above findings. IMPRESSION: 1. No evidence of pulmonary embolus. 2. Bibasilar airspace disease and trace left pleural effusion, favor pneumonia over edema. 3. Mediastinal and bilateral hilar adenopathy, likely reactive. 4.  Aortic Atherosclerosis (ICD10-I70.0). Electronically Signed   By: Randa Ngo M.D.   On: 05/28/2020 18:51        Scheduled Meds: .  atorvastatin  10 mg Oral Daily  . B-complex with vitamin C  1 tablet Oral Daily  . busPIRone  15 mg Oral BID  . Chlorhexidine Gluconate Cloth  6 each Topical Daily  . cholecalciferol  2,000 Units Oral Daily  . enoxaparin (LOVENOX) injection  50 mg Subcutaneous Q24H  . ferrous sulfate  325 mg Oral Q breakfast  . insulin aspart  0-15 Units Subcutaneous TID AC & HS  . ipratropium-albuterol  3 mL Nebulization BID  . mouth rinse  15 mL Mouth Rinse BID  . pantoprazole  40 mg Oral QHS  . pregabalin  75 mg Oral Daily  . rOPINIRole  0.5 mg Oral Daily  . rOPINIRole  3 mg Oral QHS  . sertraline  200 mg Oral q morning  . traZODone  50 mg Oral QHS   Continuous Infusions: . sodium chloride Stopped (05/28/20 2338)  . azithromycin 500 mg (05/30/20 0006)  . cefTRIAXone (ROCEPHIN)  IV 1 g (05/30/20 0008)  . ferumoxytol    . methocarbamol (ROBAXIN) IV       LOS: 2 days    Time spent: 3min  Domenic Polite, MD [facility] 05/30/2020, 3:58 PM

## 2020-05-30 NOTE — TOC Initial Note (Signed)
Transition of Care [facility]) - Initial/Assessment Note    Patient Details  Name: Suzanne Strong MRN: [id number] Date of Birth: 1945-03-08  Transition of Care [facility]) CM/SW Contact:    Shelbie Hutching, RN Phone Number: 05/30/2020, 4:03 PM  Clinical Narrative:                 Patient admitted to the hospital with community acquired pneumonia.  RNCM met with patient at the bedside.  Patient is from home where she lives alone.  Patient is independent and still works part time at [employer] office.  No discharge needs identified.    Expected Discharge Plan: Home/Self Care Barriers to Discharge: Continued Medical Work up   Patient Goals and CMS Choice Patient states their goals for this hospitalization and ongoing recovery are:: Hoping to go home today      Expected Discharge Plan and Services Expected Discharge Plan: Home/Self Care       Living arrangements for the past 2 months: Single Family Home                 DME Arranged: N/A DME Agency: NA       HH Arranged: NA          Prior Living Arrangements/Services Living arrangements for the past 2 months: Single Family Home Lives with:: Self Patient language and need for interpreter reviewed:: Yes Do you feel safe going back to the place where you live?: Yes      Need for Family Participation in Patient Care: Yes (Comment) (pneumonia) Care giver support system in place?: Yes (comment) (friends)   Criminal Activity/Legal Involvement Pertinent to Current Situation/Hospitalization: No - Comment as needed  Activities of Daily Living Home Assistive Devices/Equipment: None ADL Screening (condition at time of admission) Patient's cognitive ability adequate to safely complete daily activities?: Yes Is the patient deaf or have difficulty hearing?: No Does the patient have difficulty seeing, even when wearing glasses/contacts?: No Does the patient have difficulty concentrating, remembering, or making decisions?:  No Patient able to express need for assistance with ADLs?: Yes Does the patient have difficulty dressing or bathing?: Yes Independently performs ADLs?: No Communication: Independent Dressing (OT): Needs assistance Is this a change from baseline?: Change from baseline, expected to last <3days Grooming: Independent Feeding: Independent Bathing: Needs assistance Is this a change from baseline?: Change from baseline, expected to last <3 days Toileting: Needs assistance Is this a change from baseline?: Change from baseline, expected to last <3 days In/Out Bed: Needs assistance Is this a change from baseline?: Change from baseline, expected to last <3 days Walks in Home: Needs assistance Is this a change from baseline?: Change from baseline, expected to last <3 days Does the patient have difficulty walking or climbing stairs?: Yes Weakness of Legs: Both Weakness of Arms/Hands: None  Permission Sought/Granted Permission sought to share information with : Case Manager Permission granted to share information with : Yes, Verbal Permission Granted              Emotional Assessment Appearance:: Appears stated age Attitude/Demeanor/Rapport: Engaged Affect (typically observed): Accepting Orientation: : Oriented to Self,Oriented to Place,Oriented to  Time,Oriented to Situation Alcohol / Substance Use: Not Applicable Psych Involvement: No (comment)  Admission diagnosis:  CAP (community acquired pneumonia) [J18.9] Aspiration pneumonia, unspecified aspiration pneumonia type, unspecified laterality, unspecified part of lung ([place]) [J69.0] Patient Active Problem List   Diagnosis Date Noted  . CAP (community acquired pneumonia) 05/28/2020  . Dysphagia   . Gastric erythema   .  Gastric polyp   . Encounter for screening colonoscopy   . Polyp of colon   . Aspiration pneumonia ([place]) 02/15/2020  . Diabetes mellitus without complication ([place])   . GERD (gastroesophageal reflux disease)   .  Hypertension   . Elevated troponin   . Urinary urgency 01/05/2020  . Grief reaction 09/18/2019  . Swallowing dysfunction 07/18/2019  . Fever, intermittent 09/01/2018  . Pre-syncope 02/07/2018  . Orthostatic hypotension 01/26/2018  . Episodic weakness 01/26/2018  . Medicare annual wellness visit, subsequent 06/29/2017  . 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 Preoperative examination 04/27/2017  . Carpal tunnel syndrome of right wrist 11/13/2016  . 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[place]) History of  02/17/2016  . Obstructive sleep apnea 01/01/2016  . Carcinoma of upper-outer quadrant of left breast in female, estrogen receptor negative ([place]) 11/21/2015  . Hypomagnesemia 08/28/2015  . Initial Medicare annual wellness visit 10/16/2014  . Estrogen deficiency 10/16/2014  . Colon cancer screening 10/16/2014  . Controlled type 2 diabetes mellitus without complication ([place]) 64/15/8309  . RLS (restless legs syndrome) 02/21/2014  . Chronic neck pain 07/25/2010  . Depression with anxiety 07/07/2010  . Routine general medical examination at a health care facility 06/26/2010  . B12 deficiency 09/04/2009  . Anemia, iron deficiency 08/29/2009  . Anxiety state 08/26/2009  . GASTROPARESIS 08/26/2009  . Vitamin D deficiency 07/19/2008  . Disorder of bone and cartilage 07/19/2007  . Hyperlipidemia 07/18/2007  . EDEMA 07/18/2007  . Skin lesion, superficial 06/02/2007   PCP:  [name], MD Pharmacy:   [facility], [place] - [address] [place] [address] Phone: [phone] Fax: [phone]  [facility] [address] [place] - [place] [address] Phone: [phone] Fax: [phone]     Social Determinants of Health (SDOH) Interventions    Readmission Risk Interventions No flowsheet data found.

## 2020-05-30 NOTE — Plan of Care (Signed)
End of Shift Summary:  Alert and oriented x4. VSS. Remained on room air, sats >92%. Denies pain or n/v. Denies SOB. Ambulated to bathroom with standby, no contact assist. IV abx given per [facility]. Takes meds whole in yogurt (has some in nutrition room refrigerator labeled with her name from dietary). Trazadone ordered at bedtime to assist with sleep in place of Belsomra (non-formulary that pt did not bring with her). (+) BM and urine output adequate. Remained free from falls or injury. Call bell within reach and able to use.    Problem: Education: Goal: Knowledge of General Education information will improve Description: Including pain rating scale, medication(s)/side effects and non-pharmacologic comfort measures 05/30/2020 0624 by Wende Crease, RN Outcome: Progressing 05/30/2020 0047 by Wende Crease, RN Outcome: Progressing   Problem: Health Behavior/Discharge Planning: Goal: Ability to manage health-related needs will improve 05/30/2020 0624 by Wende Crease, RN Outcome: Progressing 05/30/2020 0047 by Wende Crease, RN Outcome: Progressing   Problem: Clinical Measurements: Goal: Ability to maintain clinical measurements within normal limits will improve 05/30/2020 0624 by Wende Crease, RN Outcome: Progressing 05/30/2020 0047 by Wende Crease, RN Outcome: Progressing Goal: Will remain free from infection 05/30/2020 0624 by Wende Crease, RN Outcome: Progressing 05/30/2020 0047 by Wende Crease, RN Outcome: Progressing Goal: Diagnostic test results will improve 05/30/2020 0624 by Wende Crease, RN Outcome: Progressing 05/30/2020 0047 by Wende Crease, RN Outcome: Progressing Goal: Respiratory complications will improve 05/30/2020 0624 by Wende Crease, RN Outcome: Progressing 05/30/2020 0047 by Wende Crease, RN Outcome: Progressing Goal: Cardiovascular complication will be avoided 05/30/2020 0624 by Wende Crease,  RN Outcome: Progressing 05/30/2020 0047 by Wende Crease, RN Outcome: Progressing   Problem: Activity: Goal: Risk for activity intolerance will decrease 05/30/2020 0624 by Wende Crease, RN Outcome: Progressing 05/30/2020 0047 by Wende Crease, RN Outcome: Progressing   Problem: Nutrition: Goal: Adequate nutrition will be maintained 05/30/2020 0624 by Wende Crease, RN Outcome: Progressing 05/30/2020 0047 by Wende Crease, RN Outcome: Progressing   Problem: Coping: Goal: Level of anxiety will decrease 05/30/2020 0624 by Wende Crease, RN Outcome: Progressing 05/30/2020 0047 by Wende Crease, RN Outcome: Progressing   Problem: Elimination: Goal: Will not experience complications related to bowel motility 05/30/2020 0624 by Wende Crease, RN Outcome: Progressing 05/30/2020 0047 by Wende Crease, RN Outcome: Progressing Goal: Will not experience complications related to urinary retention 05/30/2020 0624 by Wende Crease, RN Outcome: Progressing 05/30/2020 0047 by Wende Crease, RN Outcome: Progressing   Problem: Pain Managment: Goal: General experience of comfort will improve 05/30/2020 0624 by Wende Crease, RN Outcome: Progressing 05/30/2020 0047 by Wende Crease, RN Outcome: Progressing   Problem: Safety: Goal: Ability to remain free from injury will improve 05/30/2020 0624 by Wende Crease, RN Outcome: Progressing 05/30/2020 0047 by Wende Crease, RN Outcome: Progressing   Problem: Skin Integrity: Goal: Risk for impaired skin integrity will decrease 05/30/2020 0624 by Wende Crease, RN Outcome: Progressing 05/30/2020 0047 by Wende Crease, RN Outcome: Progressing

## 2020-05-31 LAB — BASIC METABOLIC PANEL
Anion gap: 11 (ref 5–15)
BUN: 9 mg/dL (ref 8–23)
CO2: 23 mmol/L (ref 22–32)
Calcium: 8.7 mg/dL — ABNORMAL LOW (ref 8.9–10.3)
Chloride: 103 mmol/L (ref 98–111)
Creatinine, Ser: 0.78 mg/dL (ref 0.44–1.00)
GFR, Estimated: >60 mL/min (ref >60)
Glucose, Bld: 118 mg/dL — ABNORMAL HIGH (ref 70–99)
Potassium: 3.4 mmol/L — ABNORMAL LOW (ref 3.5–5.1)
Sodium: 137 mmol/L (ref 135–145)

## 2020-05-31 LAB — CBC
HCT: 27.5 % — ABNORMAL LOW (ref 36.0–46.0)
Hemoglobin: 8.9 g/dL — ABNORMAL LOW (ref 12.0–15.0)
MCH: 27.2 pg (ref 26.0–34.0)
MCHC: 32.4 g/dL (ref 30.0–36.0)
MCV: 84.1 fL (ref 80.0–100.0)
Platelets: 214 10*3/uL (ref 150–400)
RBC: 3.27 MIL/uL — ABNORMAL LOW (ref 3.87–5.11)
RDW: 15.8 % — ABNORMAL HIGH (ref 11.5–15.5)
WBC: 6 10*3/uL (ref 4.0–10.5)
nRBC: 0 % (ref 0.0–0.2)

## 2020-05-31 LAB — GLUCOSE, CAPILLARY: Glucose-Capillary: 108 mg/dL — ABNORMAL HIGH (ref 70–99)

## 2020-05-31 MED ORDER — POTASSIUM CHLORIDE CRYS ER 10 MEQ PO TBCR
EXTENDED_RELEASE_TABLET | ORAL | Status: AC
  Filled 2020-05-31: qty 1

## 2020-05-31 MED ORDER — CEFDINIR 300 MG PO CAPS: 300.0000 mg | ORAL_CAPSULE | Freq: Two times a day (BID) | ORAL | 0 refills | Status: DC

## 2020-05-31 MED ORDER — POTASSIUM CHLORIDE CRYS ER 20 MEQ PO TBCR
40.0000 meq | EXTENDED_RELEASE_TABLET | Freq: Once | ORAL | Status: AC
  Administered 2020-05-31: 40 meq via ORAL
  Filled 2020-05-31: qty 2

## 2020-05-31 NOTE — Plan of Care (Signed)
End of Shift Summary:   Alert and oriented x4. VSS. Denies pain, n/v or SOB. Ambulating in room and up to bathroom independently. Tolerated po abx well. Remained free from falls or injury. Call bell within reach and able to use.    Problem: Health Behavior/Discharge Planning: Goal: Ability to manage health-related needs will improve Outcome: Progressing   Problem: Clinical Measurements: Goal: Ability to maintain clinical measurements within normal limits will improve Outcome: Progressing Goal: Will remain free from infection Outcome: Progressing Goal: Diagnostic test results will improve Outcome: Progressing Goal: Respiratory complications will improve Outcome: Progressing Goal: Cardiovascular complication will be avoided Outcome: Progressing

## 2020-05-31 NOTE — Care Management Important Message (Signed)
Important Message  Patient Details  Name: Suzanne Strong MRN: [id number] Date of Birth: 10/14/1945   Medicare Important Message Given:  N/A - LOS <3 / Initial given by admissions     Juliann Pulse A Zalman Hull 05/31/2020, 7:53 AM

## 2020-06-01 LAB — CULTURE, RESPIRATORY W GRAM STAIN

## 2020-06-01 NOTE — Discharge Summary (Signed)
Physician Discharge Summary  Suzanne Strong ZOX:[id number] DOB: 11/18/1945 DOA: 05/28/2020  PCP: [name], MD  Admit date: 05/28/2020 Discharge date: 05/31/2020  Time spent: 35 minutes  Recommendations for Outpatient Follow-up:  1. 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 -In the emergency room she was tachycardic and tachypneic, hypoxic with sats of 82%, labs revealed leukocytosis, chest x-ray noted hazy pulmonary airspace disease, CT chest noted by bibasilar airspace disease favoring pneumonia with reactive adenopathy  Hospital Course:   Aspiration pneumonia Septic shock poa -Treated with fluid resuscitation and IV antibiotics with good clinical improvement  -All cultures were negative -Transitioned from IV antibiotics to oral cefdinir to complete antibiotic course -SLP evaluation completed, has chronic dysphagia which was felt to be stable, recommended dysphagia 3 diet and aspiration precautions -Discharged home in a stable condition, advised to follow-up with PCP in 1 week  Elevated troponin -Secondary to demand ischemia, clinically do not suspect  ACS  Iron deficiency anemia -Chronic issue, given IV iron x1 this admission, continue oral iron, increased dose to twice daily  Remote history of tonsillar cancer and breast cancer -Reportedly in remission, follow-up with oncology  Type 2 diabetes mellitus -Stable, home regimen of glipizide, Januvia/Metformin resumed  Essential hypertension -Amlodipine resumed at discharge  Anxiety, depression -Continued home regimen of BuSpar, Xanax, Zoloft and trazodone  Discharge Exam: Vitals:   05/31/20 0802 05/31/20 0813  BP:  (!) 172/74  Pulse: 93 84  Resp: 16 18  Temp:  98.5 F (36.9 C)  SpO2: (!) 88% 98%    General: AAOx3 Cardiovascular: S1-S2, regular rate rhythm Respiratory: Few basilar rhonchi  Discharge Instructions   Discharge Instructions    Diet - low sodium heart healthy   Complete by: As directed    Increase activity slowly   Complete by: As directed      Allergies as of 05/31/2020      Reactions   Amoxicillin Swelling   REACTION: rash, swelling Has patient had a PCN reaction causing immediate rash, facial/tongue/throat swelling, SOB or lightheadedness with hypotension: yes Has patient had a PCN reaction causing severe rash involving mucus membranes or skin necrosis: no Has patient had a PCN reaction that required hospitalization: no Has patient had a PCN reaction occurring within the last 10 years: yes If all of the above answers are "NO", then may proceed with Cephalosporin use. Has tolerated ceftriaxone   Penicillin G Anaphylaxis   Has tolerated ceftriaxone on multiple occasions    Fluconazole Rash   REACTION: hives   Difuleron [ferrous Fumarate-dss]    Other Other (See Comments)   Pt has had tonsil cancer (on Left side) - pt may have difficulty swallowing      Medication List    STOP taking these medications   meloxicam  7.5 MG tablet Commonly known as: MOBIC   Na Sulfate-K Sulfate-Mg Sulf 17.5-3.13-1.6 GM/177ML Soln     TAKE these  medications   acetaminophen 325 MG tablet Commonly known as: TYLENOL Take 2 tablets (650 mg total) by mouth every 6 (six) hours as needed for mild pain (or Fever >/= 101).   albuterol 108 (90 Base) MCG/ACT inhaler Commonly known as: VENTOLIN HFA INHALE TWO PUFFS BY MOUTH EVERY SIX HOURS AS NEEDED   ALPRAZolam 0.5 MG tablet Commonly known as: XANAX TAKE ONE TABLET BY MOUTH twice daily AS NEEDED FOR ANXIETY OR SLEEP   amitriptyline 50 MG tablet Commonly known as: ELAVIL Take 1 tablet (50 mg total) by mouth at bedtime.   amLODipine 5 MG tablet Commonly known as: NORVASC Take 1 tablet (5 mg total) by mouth daily.   atorvastatin 10 MG tablet Commonly known as: LIPITOR TAKE 1 TABLET(10 MG) BY MOUTH DAILY   b complex vitamins tablet Take 1 tablet by mouth daily.   Belsomra 20 MG Tabs Generic drug: Suvorexant Take 1 tablet by mouth at bedtime.   busPIRone 15 MG tablet Commonly known as: BUSPAR Take 1 tablet (15 mg total) by mouth 2 (two) times daily.   cefdinir 300 MG capsule Commonly known as: OMNICEF Take 1 capsule (300 mg total) by mouth every 12 (twelve) hours.   ferrous sulfate 325 (65 FE) MG tablet Take 1 tablet (325 mg total) by mouth 2 (two) times daily with a meal. What changed: when to take this   glipiZIDE 5 MG tablet Commonly known as: GLUCOTROL Take 5 mg by mouth 2 (two) times daily.   glucose blood test strip To check blood glucose once daily and prn for diabetes   Janumet XR 50-1000 MG Tb24 Generic drug: SitaGLIPtin-MetFORMIN HCl Take 1 tablet by mouth 2 (two) times daily.   lidocaine-prilocaine cream Commonly known as: EMLA Apply 1 application topically as needed. Apply to port when going through Chemo   omeprazole 20 MG capsule Commonly known as: PRILOSEC TAKE 1 CAPSULE(20 MG) BY MOUTH AT BEDTIME   pregabalin 75 MG capsule Commonly known as: LYRICA Take 1 capsule (75 mg total) by mouth daily.   rOPINIRole 1 MG tablet Commonly known as:  REQUIP TAKE 1/2 TABLET BY MOUTH EVERY MORNING and TAKE THREE TABLETS BY MOUTH EVERYDAY AT BEDTIME   sertraline 100 MG tablet Commonly known as: ZOLOFT TAKE TWO TABLETS BY MOUTH EVERY MORNING   traZODone 50 MG tablet Commonly known as: DESYREL Take 1/2-2 tabs po QHS prn insomnia   Vitamin D-1000 Max St 25 MCG (1000 UT) tablet Generic drug: Cholecalciferol Take 2,000 Units by mouth daily. Takes three 50 mcg tablets, 3 capsules daily   WOMENS MULTIVITAMIN PO Take by mouth.      Allergies  Allergen Reactions  . Amoxicillin Swelling    REACTION: rash, swelling Has patient had a PCN reaction causing immediate rash, facial/tongue/throat swelling, SOB or lightheadedness with hypotension: yes Has patient had a PCN reaction causing severe rash involving mucus membranes or skin necrosis: no Has patient had a PCN reaction that required hospitalization: no Has patient had a PCN reaction occurring within the last 10 years: yes If all of the above answers are "NO", then may proceed with Cephalosporin use.  Has tolerated ceftriaxone  . Penicillin G Anaphylaxis    Has tolerated ceftriaxone on multiple occasions   . Fluconazole Rash    REACTION: hives  . Difuleron [Ferrous Fumarate-Dss]   . Other Other (See Comments)  Pt has had tonsil cancer (on Left side) - pt may have difficulty swallowing    Follow-up Information    Tower, [name], MD. Schedule an appointment as soon as possible for a visit in 1 week.   Specialties: Family Medicine, Radiology Why: office closed due to holiday; patient needs to make own follow-up appointment. Contact information: [place] [address] [phone]                The results of significant diagnostics from this hospitalization (including imaging, microbiology, ancillary and laboratory) are listed below for reference.    Significant Diagnostic Studies: CT Angio Chest PE W and/or Wo Contrast  Result Date:  05/28/2020 CLINICAL DATA:  Acute shortness of breath, elevated D-dimer, history of left breast cancer EXAM: CT ANGIOGRAPHY CHEST WITH CONTRAST TECHNIQUE: Multidetector CT imaging of the chest was performed using the standard protocol during bolus administration of intravenous contrast. Multiplanar CT image reconstructions and MIPs were obtained to evaluate the vascular anatomy. CONTRAST:  68mL OMNIPAQUE IOHEXOL 350 MG/ML SOLN COMPARISON:  05/28/2020 FINDINGS: Cardiovascular: This is a technically adequate evaluation of the pulmonary vasculature. No filling defects or pulmonary emboli. The heart is unremarkable without pericardial effusion. No evidence of thoracic aortic aneurysm or dissection. Moderate atherosclerosis of the aortic arch and descending thoracic aorta. Mediastinum/Nodes: Mediastinal and bilateral hilar adenopathy are seen. Subcarinal adenopathy and precarinal adenopathy measure up to 12 mm in short axis. Thyroid, trachea, and esophagus are unremarkable. Lungs/Pleura: There is bibasilar airspace disease greatest within the lower lobes. Trace left pleural fluid. No pneumothorax. Central airways are patent. Upper Abdomen: No acute abnormality. Musculoskeletal: No acute or destructive bony lesions. Reconstructed images demonstrate no additional findings. Review of the MIP images confirms the above findings. IMPRESSION: 1. No evidence of pulmonary embolus. 2. Bibasilar airspace disease and trace left pleural effusion, favor pneumonia over edema. 3. Mediastinal and bilateral hilar adenopathy, likely reactive. 4.  Aortic Atherosclerosis (ICD10-I70.0). Electronically Signed   By: Randa Ngo M.D.   On: 05/28/2020 18:51   DG Chest Portable 1 View  Result Date: 05/28/2020 CLINICAL DATA:  Shortness of breath EXAM: PORTABLE CHEST 1 VIEW COMPARISON:  02/14/2020 FINDINGS: Cardiomegaly with vascular congestion and hazy pulmonary airspace disease, possible edema. More patchy confluent airspace disease at the  bases. No pleural effusion or pneumothorax. IMPRESSION: Cardiomegaly with vascular congestion and hazy pulmonary airspace disease, possible edema. Patchy confluent airspace disease at the bases may reflect atelectasis or pneumonia. Electronically Signed   By: Donavan Foil M.D.   On: 05/28/2020 18:09   Microbiology: Recent Results (from the past 240 hour(s))  Culture, sputum-assessment     Status: None   Collection Time: 05/28/20  1:35 PM   Specimen: Expectorated Sputum  Result Value Ref Range Status   Specimen Description EXPECTORATED SPUTUM  Final   Special Requests NONE  Final   Sputum evaluation   Final    THIS SPECIMEN IS ACCEPTABLE FOR SPUTUM CULTURE Performed at [facility], [address] [place], [place] [address]    Report Status 05/29/2020 FINAL  Final  Culture, Respiratory w Gram Stain     Status: None   Collection Time: 05/28/20  1:35 PM  Result Value Ref Range Status   Specimen Description   Final    EXPECTORATED SPUTUM Performed at [facility], [address], [place], [place] [address]    Special Requests   Final    NONE Reflexed from [id number] Performed at [facility],  [place], [place] [address]    Gram Stain   Final    ABUNDANT WBC PRESENT, PREDOMINANTLY MONONUCLEAR FEW GRAM POSITIVE COCCI IN PAIRS FEW BUDDING YEAST SEEN FEW GRAM VARIABLE ROD Performed at [facility], [place] [address], [place], [place] [address]    Culture FEW STREPTOCOCCUS PNEUMONIAE  Final   Report Status 06/01/2020 FINAL  Final   Organism ID, Bacteria STREPTOCOCCUS PNEUMONIAE  Final      Susceptibility   Streptococcus pneumoniae - MIC*    ERYTHROMYCIN >=8 RESISTANT Resistant     LEVOFLOXACIN 0.5 SENSITIVE Sensitive     VANCOMYCIN 0.5 SENSITIVE Sensitive     PENO - penicillin 1      PENICILLIN (non-meningitis) 1 SENSITIVE Sensitive     PENICILLIN (oral) 1 INTERMEDIATE Intermediate     CEFTRIAXONE (non-meningitis) 1 SENSITIVE Sensitive      * FEW STREPTOCOCCUS PNEUMONIAE  Culture, blood (routine x 2)     Status: None (Preliminary result)   Collection Time: 05/28/20  5:00 PM   Specimen: BLOOD  Result Value Ref Range Status   Specimen Description BLOOD RIGHT ANTECUBITAL  Final   Special Requests   Final    BOTTLES DRAWN AEROBIC AND ANAEROBIC Blood Culture adequate volume   Culture   Final    NO GROWTH 4 DAYS Performed at [facility], [address] [place], [place] [address]    Report Status PENDING  Incomplete  Culture, blood (routine x 2)     Status: None (Preliminary result)   Collection Time: 05/28/20  5:00 PM   Specimen: BLOOD  Result Value Ref Range Status   Specimen Description BLOOD LEFT ANTECUBITAL  Final   Special Requests   Final    BOTTLES DRAWN AEROBIC AND ANAEROBIC Blood Culture adequate volume   Culture   Final    NO GROWTH 4 DAYS Performed at [facility], [address] [place], [place] [address]    Report Status PENDING  Incomplete  Resp Panel by RT-PCR (Flu A&B, Covid) Nasopharyngeal Swab     Status: None   Collection Time: 05/28/20  5:00 PM   Specimen: Nasopharyngeal Swab; Nasopharyngeal(NP) swabs in vial transport medium  Result Value Ref Range Status   SARS Coronavirus 2 by RT PCR NEGATIVE NEGATIVE Final    Comment: (NOTE) SARS-CoV-2 target nucleic acids are NOT DETECTED.  The SARS-CoV-2 RNA is generally detectable in upper respiratory specimens during the acute phase of infection. The lowest concentration of SARS-CoV-2 viral copies this assay can detect is 138 copies/mL. A negative result does not preclude SARS-Cov-2 infection and should not be used as the sole basis for treatment or other patient management decisions. A negative result may occur with  improper specimen collection/handling, submission of specimen other than nasopharyngeal swab, presence of viral mutation(s) within the areas targeted by this assay, and inadequate number of viral copies(<138  copies/mL). A negative result must be combined with clinical observations, patient history, and epidemiological information. The expected result is Negative.  Fact Sheet for Patients:  EntrepreneurPulse.com.au  Fact Sheet for Healthcare Providers:  IncredibleEmployment.be  This test is no t yet approved or cleared by the [place] FDA and  has been authorized for detection and/or diagnosis of SARS-CoV-2 by FDA under an Emergency Use Authorization (EUA). This EUA will remain  in effect (meaning this test can be used) for the duration of the COVID-19 declaration under Section 564(b)(1) of the Act, 21 U.S.C.section 360bbb-3(b)(1), unless the authorization is terminated  or revoked sooner.       Influenza A by PCR NEGATIVE NEGATIVE Final   Influenza B by PCR NEGATIVE NEGATIVE Final    Comment: (NOTE) The Xpert Xpress SARS-CoV-2/FLU/RSV plus assay is intended as an aid in the diagnosis of influenza from Nasopharyngeal swab specimens and should not be used as a sole basis for treatment. Nasal washings and aspirates are unacceptable for Xpert Xpress SARS-CoV-2/FLU/RSV testing.  Fact Sheet for Patients: EntrepreneurPulse.com.au  Fact Sheet for Healthcare Providers: IncredibleEmployment.be  This test is not yet approved or cleared by the [place] FDA and has been authorized for detection and/or diagnosis of SARS-CoV-2 by FDA under an Emergency Use Authorization (EUA). This EUA will remain in effect (meaning this test can be used) for the duration of the COVID-19 declaration under Section 564(b)(1) of the Act, 21 U.S.C. section 360bbb-3(b)(1), unless the authorization is terminated or revoked.  Performed at [facility], [address] [place], [place] [address]   MRSA PCR Screening     Status: None   Collection Time: 05/28/20 10:09 PM   Specimen: Nasopharyngeal  Result Value Ref Range  Status   MRSA by PCR NEGATIVE NEGATIVE Final    Comment:        The GeneXpert MRSA Assay (FDA approved for NASAL specimens only), is one component of a comprehensive MRSA colonization surveillance program. It is not intended to diagnose MRSA infection nor to guide or monitor treatment for MRSA infections. Performed at [facility], [address] [place], [place] [address]      Labs: Basic Metabolic Panel: Recent Labs  Lab 05/28/20 1129 05/28/20 1700 05/29/20 0300 05/30/20 0425 05/31/20 0456  NA 140 138 139 137 137  K 3.9 4.1 4.4 4.3 3.4*  CL 103 104 108 105 103  CO2 25 23 23 23 23   GLUCOSE 181* 183* 156* 124* 118*  BUN 14 15 17 12 9   CREATININE 1.05* 1.22* 1.06* 0.79 0.78  CALCIUM 8.5* 8.5* 7.1* 8.0* 8.7*  MG  --  1.3* 2.6*  --   --    Liver Function Tests: Recent Labs  Lab 05/28/20 1129 05/28/20 1700  AST 22 25  ALT 11 12  ALKPHOS 77 67  BILITOT 0.5 0.4  PROT 8.3* 7.3  ALBUMIN 3.9 3.2*   No results for input(s): LIPASE, AMYLASE in the last 168 hours. No results for input(s): AMMONIA in the last 168 hours. CBC: Recent Labs  Lab 05/28/20 1129 05/28/20 1700 05/29/20 0300 05/30/20 0425 05/31/20 0456  WBC 9.0 8.0 13.5* 8.5 6.0  NEUTROABS 7.7 7.4  --   --   --   HGB 10.6* 10.2* 8.4* 8.1* 8.9*  HCT 35.1* 32.7* 27.6* 25.9* 27.5*  MCV 89.1 86.3 87.6 86.0 84.1  PLT 233 220 188 187 214   Cardiac Enzymes: No results for input(s): CKTOTAL, CKMB, CKMBINDEX, TROPONINI in the last 168 hours. BNP: BNP (last 3 results) Recent Labs    05/28/20 1700  BNP 62.5    ProBNP (last 3 results) No results for input(s): PROBNP in the last 8760 hours.  CBG: Recent Labs  Lab 05/30/20 0819 05/30/20 1242 05/30/20 1721 05/30/20 2121 05/31/20 0819  GLUCAP 111* 129* 88 105* 108*       Signed:  Domenic Polite MD.  [facility] 06/01/2020, 4:00 PM

## 2020-06-02 LAB — CULTURE, BLOOD (ROUTINE X 2)
Culture: NO GROWTH
Culture: NO GROWTH
Special Requests: ADEQUATE
Special Requests: ADEQUATE

## 2020-06-03 ENCOUNTER — Telehealth: Payer: Self-pay

## 2020-06-03 NOTE — Telephone Encounter (Signed)
Transition Care Management Follow-up Telephone Call  Date of discharge and from where: 05/31/2020, [facility]  How have you been since you were released from the hospital? Patient is doing fine. Resting at home comfortably.  Any questions or concerns? No  Items Reviewed:  Did the pt receive and understand the discharge instructions provided? Yes   Medications obtained and verified? Yes   Other? No   Any new allergies since your discharge? No   Dietary orders reviewed? Yes  Do you have support at home? Yes   Home Care and Equipment/Supplies: Were home health services ordered? not applicable If so, what is the name of the agency? N/A  Has the agency set up a time to come to the patient's home? not applicable Were any new equipment or medical supplies ordered?  No What is the name of the medical supply agency? N/A Were you able to get the supplies/equipment? not applicable Do you have any questions related to the use of the equipment or supplies? No  Functional Questionnaire: (I = Independent and D = Dependent) ADLs: I  Bathing/Dressing- I  Meal Prep- I  Eating- I  Maintaining continence- I  Transferring/Ambulation- I  Managing Meds- I  Follow up appointments reviewed:   PCP Hospital f/u appt confirmed? Yes  Scheduled to see Dr. Glori Bickers on 06/11/2020 @ 2:30 pm.  [facility] f/u appt confirmed? N/A   Are transportation arrangements needed? No   If their condition worsens, is the pt aware to call PCP or go to the Emergency Dept.? Yes  Was the patient provided with contact information for the PCP's office or ED? Yes  Was to pt encouraged to call back with questions or concerns? Yes

## 2020-06-05 ENCOUNTER — Other Ambulatory Visit: Payer: Self-pay | Admitting: Family Medicine

## 2020-06-10 ENCOUNTER — Telehealth: Payer: Self-pay

## 2020-06-10 NOTE — Chronic Care Management (AMB) (Addendum)
Chronic Care Management Pharmacy Assistant   Name: Suzanne Strong  MRN: [id number] DOB: 1945-10-20   Reason for Encounter: Medication Review - Medication Adherence and Delivery Coordination   Recent office visits:  06/11/2020      Dr.Marne Tower PCP    Continue oral iron BID, labs ordered.  Completed course of Cefdinir 300mg .  Recent consult visits:  05/28/2020 [facility] ordered 05/24/2020 [facility] 05/14/2020 [facility] visits:  Medication Reconciliation was completed by comparing discharge summary and patient's EMR and Pharmacy list. Admitted to the hospital on 05/28/2020 due to  Pneumonia Discharge date was 05/31/2020  Discharged from  [facility]?Medications Started at [facility] Discharge:??   Acetominophen 650 mg. 1 tablet every 6 hours prn mild pain or fever.   Cefdinir 300 mg. 1 tabletg every 12 hours.   Ferrous Sulfate 325 mg. 1 tablet 2 times daily with meals  Medications that remain the same after Hospital Discharge:??All other medications will remain the same.    Medications: Outpatient Encounter Medications as of 06/10/2020  Medication Sig   acetaminophen (TYLENOL) 325 MG tablet Take 2 tablets (650 mg total) by mouth every 6 (six) hours as needed for mild pain (or Fever >/= 101).   albuterol (VENTOLIN HFA) 108 (90 Base) MCG/ACT inhaler INHALE TWO PUFFS BY MOUTH EVERY SIX HOURS AS NEEDED   ALPRAZolam (XANAX) 0.5 MG tablet TAKE ONE TABLET BY MOUTH twice daily AS NEEDED FOR ANXIETY OR SLEEP   amitriptyline (ELAVIL) 50 MG tablet Take 1 tablet (50 mg total) by mouth at bedtime.   amLODipine (NORVASC) 5 MG tablet Take 1 tablet (5 mg total) by mouth daily.   atorvastatin (LIPITOR) 10 MG tablet TAKE 1 TABLET(10 MG) BY MOUTH DAILY   b complex vitamins tablet Take 1 tablet by mouth daily.   busPIRone (BUSPAR) 15 MG tablet Take 1 tablet (15 mg total) by mouth 2 (two) times daily.    cefdinir (OMNICEF) 300 MG capsule Take 1 capsule (300 mg total) by mouth every 12 (twelve) hours.   Cholecalciferol (VITAMIN D-1000 MAX ST) 25 MCG (1000 UT) tablet Take 2,000 Units by mouth daily. Takes three 50 mcg tablets, 3 capsules daily   ferrous sulfate 325 (65 FE) MG tablet Take 1 tablet (325 mg total) by mouth 2 (two) times daily with a meal.   glipiZIDE (GLUCOTROL) 5 MG tablet Take 5 mg by mouth 2 (two) times daily.   glucose blood test strip To check blood glucose once daily and prn for diabetes   JANUMET XR 50-1000 MG TB24 Take 1 tablet by mouth 2 (two) times daily.    lidocaine-prilocaine (EMLA) cream Apply 1 application topically as needed. Apply to port when going through Chemo   Multiple Vitamins-Minerals (WOMENS MULTIVITAMIN PO) Take by mouth.   omeprazole (PRILOSEC) 20 MG capsule TAKE ONE CAPSULE BY MOUTH EVERYDAY AT BEDTIME   pregabalin (LYRICA) 75 MG capsule Take 1 capsule (75 mg total) by mouth daily.   rOPINIRole (REQUIP) 1 MG tablet TAKE 1/2 TABLET BY MOUTH EVERY MORNING and TAKE THREE TABLETS BY MOUTH EVERYDAY AT BEDTIME   sertraline (ZOLOFT) 100 MG tablet TAKE TWO TABLETS BY MOUTH EVERY MORNING   Suvorexant (BELSOMRA) 20 MG TABS Take 1 tablet by mouth at bedtime.   traZODone (DESYREL) 50 MG tablet Take 1/2-2 tabs po QHS prn insomnia   Facility-Administered Encounter Medications as of 06/10/2020  Medication   sodium chloride  flush (NS) 0.9 % injection 10 mL    BP Readings from Last 3 Encounters:  05/31/20 (!) 172/74  05/28/20 (!) (P) 152/74  05/24/20 (!) 151/67    Lab Results  Component Value Date   HGBA1C 6.7 (H) 05/28/2020    Left message to return call on 4/25,4/26,4/27 - Unsuccessful outreach. Reviewed chart for medication changes and coordinated pharmacy delivery.  Patient obtains medications through Adherence Packaging  30 Days   Last adherence delivery date:  05/17/2020  PACKS, 30 DS  Alpha lipoic acid 200 mg - 1 tablet daily (bedtime)  Amitriptyline  50 mg - 1 tablet daily (bedtime)  Amlodipine 5 mg- 1 tablet daily (breakfast)   Atorvastatin 10 mg- 1 tablet daily (breakfast)  Buspirone 15 mg- 1 tablet twice daily (breakfast and bedtime)   Glipizide 5 mg BID 1 tablet twice daily (breakfast and evening meal)   Janumet XR 50/1000 mg - 1 tablet two times a day (breakfast and evening meal)   Omeprazole 20 mg - 1 tablet daily (bedtime)  Ropinirole 1 mg -  tablet once daily (breakfast) and 3 tablets once nightly (bedtime)  Sertraline 100 mg - 2 tablets daily (breakfast)  Trazodone 100 mg - 1 tablet daily (bedtime)   PRN/VIAL, 30 DS  Alprazolam 0.5 mg- Take 1 tablet twice daily PRN  Pregabalin 150 mg - 1 twice daily (breakfast and bedtime)    Patient is due for next adherence delivery on: 06/17/2020     This delivery to include: Adherence Packaging  30 Days    PACKS, 30 DS: Alpha lipoic acid 200 mg - 1 tablet daily (bedtime) Amitriptyline 50 mg - 1 tablet daily (bedtime) Amlodipine 5 mg- 1 tablet daily (breakfast)  Atorvastatin 10 mg- 1 tablet daily (breakfast) Buspirone 15 mg- 1 tablet twice daily (breakfast and bedtime)  Glipizide 5 mg BID 1 tablet twice daily (breakfast and evening meal)  Janumet XR 50/1000 mg - 1 tablet two times a day (breakfast and evening meal)  Omeprazole 20 mg - 1 tablet daily (bedtime) Ropinirole 1 mg -  tablet once daily (breakfast) and 3 tablets once nightly (bedtime) Sertraline 100 mg - 2 tablets daily (breakfast) Trazodone 100 mg - 1 tablet daily (bedtime)  PRN/VIAL medications: Alprazolam 0.5 mg - take 1 tablet twice daily PRN Pregabalin 150 mg - take 1 tablet twice daily  Refills needed on: atorvastatin  Note - Patient to buy iron OTC. Unable to reach patient to determine if she wants this filled from [facility] and in her packaging.  Patient declined the following medications this month: Belsomra 20 mg tabs-1 tab daily at bedtime (bedtime) COST CONCERN - Unable to speak with patient to discuss if  she wanted this included. Albuterol inhaler -Inhale 2 puffs every 6 hours as needed- PRN - Unable to speak with patient to discuss if she wanted this included. OTC Centrum Silver Multivitamin - 1 tablet daily (breakfast)- Pt buys OTC OneTouch ultra test strips - use to check blood sugar once daily - Unable to speak with patient to discuss if she wanted this included. OTC Vitamin B-Complex - 1 tablet daily ( breakfast)- Pt buys OTC OTC Vitamin D3 50 mcg - 1 tablet twice daily (breakfast and bedtime) Pt buys OTC  Follow-Up:  Coordination of Enhanced Pharmacy Services and Pharmacist Review  [name], CPP notified  Avel Sensor, [place] Pharmacy Assistant [phone]  I have reviewed the care management and care coordination activities outlined in this encounter and I am certifying that I  agree with the content of this note. No further action required.  [name], PharmD Clinical Pharmacist [facility] at [facility] [phone]

## 2020-06-11 ENCOUNTER — Encounter: Payer: Self-pay | Admitting: Family Medicine

## 2020-06-11 ENCOUNTER — Other Ambulatory Visit: Payer: Self-pay

## 2020-06-11 ENCOUNTER — Ambulatory Visit (INDEPENDENT_AMBULATORY_CARE_PROVIDER_SITE_OTHER): Payer: PPO | Admitting: Family Medicine

## 2020-06-11 VITALS — BP 136/78 | HR 80 | Temp 96.9°F | Ht 66.0 in | Wt 217.1 lb

## 2020-06-11 DIAGNOSIS — I10 Essential (primary) hypertension: Secondary | ICD-10-CM | POA: Diagnosis not present

## 2020-06-11 DIAGNOSIS — D509 Iron deficiency anemia, unspecified: Secondary | ICD-10-CM

## 2020-06-11 DIAGNOSIS — J690 Pneumonitis due to inhalation of food and vomit: Secondary | ICD-10-CM

## 2020-06-11 DIAGNOSIS — R1310 Dysphagia, unspecified: Secondary | ICD-10-CM | POA: Diagnosis not present

## 2020-06-11 DIAGNOSIS — R1312 Dysphagia, oropharyngeal phase: Secondary | ICD-10-CM

## 2020-06-11 DIAGNOSIS — H26491 Other secondary cataract, right eye: Secondary | ICD-10-CM | POA: Diagnosis not present

## 2020-06-11 NOTE — Assessment & Plan Note (Addendum)
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

## 2020-06-11 NOTE — Progress Notes (Signed)
Subjective:    Patient ID: Suzanne Strong, female    DOB: [birth year], 75 y.o.   MRN: [id number]  This visit occurred during the SARS-CoV-2 public health emergency.  Safety protocols were in place, including screening questions prior to the visit, additional usage of staff PPE, and extensive cleaning of exam room while observing appropriate contact time as indicated for disinfecting solutions.    HPI Pt presents for f/u after hospitalization for aspiration pna and sepsis 4/12 through 4/15  Wt Readings from Last 3 Encounters:  06/11/20 217 lb 2 oz (98.5 kg)  05/28/20 222 lb (100.7 kg)  05/28/20 (P) 224 lb 6.4 oz (101.8 kg)   35.04 kg/m  Was on the way to have a laser eye surgery  Got sick very fast and was hypoxic  hosp course as follows   Hospital Course:   Aspiration pneumonia Septic shockpoa -Treated with fluid resuscitation and IV antibiotics with good clinical improvement  -All cultures were negative -Transitioned from IV antibiotics to oral cefdinir to complete antibiotic course -SLP evaluation completed, has chronic dysphagia which was felt to be stable, recommended dysphagia 3 diet and aspiration precautions -Discharged home in a stable condition, advised to follow-up with PCP in 1 week  Following the dyphagia 3 diet at home  Mashing food and chopping it fine  Avoids things cannot do that with   Does breath hold/swallow/cough if she has an episode    Elevated troponin -Secondary to demand ischemia, clinically do not suspect ACS  Iron deficiency anemia -Chronic issue, given IV iron x1 this admission, continue oral iron, increased dose to twice daily  She had EGD and colonoscopy - neg for bleeding sources (small colon polyp)   Remote history of tonsillar cancer and breast cancer -Reportedly in remission, follow-up with oncology  Type 2 diabetes mellitus -Stable, home regimen of glipizide, Januvia/Metformin resumed  Per pt blood glucose checks have  been ok  Has appt with endocrinology upcoming    Essential hypertension -Amlodipine resumed at discharge  Stable today  BP Readings from Last 3 Encounters:  06/11/20 136/78  05/31/20 (!) 172/74  05/28/20 (!) (P) 152/74     Anxiety, depression -Continued home regimen of BuSpar, Xanax, Zoloft and trazodone  Per pt -mood is good  Still has trouble sleeping   Studies: CT Angio Chest PE W and/or Wo Contrast  Result Date: 05/28/2020 CLINICAL DATA:  Acute shortness of breath, elevated D-dimer, history of left breast cancer EXAM: CT ANGIOGRAPHY CHEST WITH CONTRAST TECHNIQUE: Multidetector CT imaging of the chest was performed using the standard protocol during bolus administration of intravenous contrast. Multiplanar CT image reconstructions and MIPs were obtained to evaluate the vascular anatomy. CONTRAST:  66mL OMNIPAQUE IOHEXOL 350 MG/ML SOLN COMPARISON:  05/28/2020 FINDINGS: Cardiovascular: This is a technically adequate evaluation of the pulmonary vasculature. No filling defects or pulmonary emboli. The heart is unremarkable without pericardial effusion. No evidence of thoracic aortic aneurysm or dissection. Moderate atherosclerosis of the aortic arch and descending thoracic aorta. Mediastinum/Nodes: Mediastinal and bilateral hilar adenopathy are seen. Subcarinal adenopathy and precarinal adenopathy measure up to 12 mm in short axis. Thyroid, trachea, and esophagus are unremarkable. Lungs/Pleura: There is bibasilar airspace disease greatest within the lower lobes. Trace left pleural fluid. No pneumothorax. Central airways are patent. Upper Abdomen: No acute abnormality. Musculoskeletal: No acute or destructive bony lesions. Reconstructed images demonstrate no additional findings. Review of the MIP images confirms the above findings. IMPRESSION: 1. No evidence of pulmonary embolus. 2. Bibasilar airspace disease  and trace left pleural effusion, favor pneumonia over edema. 3. Mediastinal and  bilateral hilar adenopathy, likely reactive. 4.  Aortic Atherosclerosis (ICD10-I70.0). Electronically Signed   By: Sharlet SalinaMichael  Brown M.D.   On: 05/28/2020 18:51   DG Chest Portable 1 View  Result Date: 05/28/2020 CLINICAL DATA:  Shortness of breath EXAM: PORTABLE CHEST 1 VIEW COMPARISON:  02/14/2020 FINDINGS: Cardiomegaly with vascular congestion and hazy pulmonary airspace disease, possible edema. More patchy confluent airspace disease at the bases. No pleural effusion or pneumothorax. IMPRESSION: Cardiomegaly with vascular congestion and hazy pulmonary airspace disease, possible edema. Patchy confluent airspace disease at the bases may reflect atelectasis or pneumonia. Electronically Signed   By: Jasmine PangKim  Fujinaga M.D.   On: 05/28/2020 18:09    Sputum grew out strep pna resistant to emycin  Negative blood cultures  Neg flu and covid tests Neg mrsa screen   Lab Results  Component Value Date   CREATININE 0.78 05/31/2020   BUN 9 05/31/2020   NA 137 05/31/2020   K 3.4 (L) 05/31/2020   CL 103 05/31/2020   CO2 23 05/31/2020   Lab Results  Component Value Date   WBC 6.0 05/31/2020   HGB 8.9 (L) 05/31/2020   HCT 27.5 (L) 05/31/2020   MCV 84.1 05/31/2020   PLT 214 05/31/2020   Lab Results  Component Value Date   ALT 12 05/28/2020   AST 25 05/28/2020   ALKPHOS 67 05/28/2020   BILITOT 0.4 05/28/2020   Taking her oral iron  Has hematology f/u upcoming   Energy level is improved but not 100%  Able to ambulate and take care of herself   Patient Active Problem List   Diagnosis Date Noted  . Dysphagia   . Gastric erythema   . Gastric polyp   . Encounter for screening colonoscopy   . Polyp of colon   . Aspiration pneumonia (HCC) 02/15/2020  . Diabetes mellitus without complication (HCC)   . GERD (gastroesophageal reflux disease)   . Hypertension   . Elevated troponin   . Urinary urgency 01/05/2020  . Grief reaction 09/18/2019  . Swallowing dysfunction 07/18/2019  . Fever,  intermittent 09/01/2018  . Pre-syncope 02/07/2018  . Orthostatic hypotension 01/26/2018  . Episodic weakness 01/26/2018  . Medicare annual wellness visit, subsequent 06/29/2017  . 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 Preoperative examination 04/27/2017  . Carpal tunnel syndrome of right wrist 11/13/2016  . 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HCC) History of  02/17/2016  . Obstructive sleep apnea 01/01/2016  . Carcinoma of upper-outer quadrant of left breast in female, estrogen receptor negative (HCC) 11/21/2015  . Hypomagnesemia 08/28/2015  . Initial Medicare annual wellness visit 10/16/2014  . Estrogen deficiency 10/16/2014  . Colon cancer screening 10/16/2014  . Controlled type 2 diabetes mellitus without complication (HCC) 03/28/2014  . RLS (restless legs syndrome) 02/21/2014  . Chronic neck pain 07/25/2010  . Depression with anxiety 07/07/2010  . Routine general medical examination at a health care facility 06/26/2010  . B12 deficiency 09/04/2009  . Anemia, iron deficiency 08/29/2009  . Anxiety state 08/26/2009  . GASTROPARESIS 08/26/2009  . Vitamin D deficiency 07/19/2008  . Disorder of bone and cartilage 07/19/2007  . Hyperlipidemia 07/18/2007  . EDEMA 07/18/2007  . Skin lesion, superficial 06/02/2007   Past Medical History:  Diagnosis Date  . Anemia    iron deficiency and B12 deficiency  . Anxiety   . Breast cancer (HCC) 02/25/2015   upper-outer quadrant of left female breast, Triple Negative. Neo-adjuvant chemo with complete  pathologic response.   . Cervical dysplasia    conization  . Diabetes mellitus without complication ([place])   . Diverticulosis   . Fever 04/11/2015  . Gastroparesis    related to previous radiation tx  . GERD (gastroesophageal reflux disease)   . Hyperlipidemia   . Hypertension   . Neuropathy    legs/feet, S/P chemo meds  . Personal history of chemotherapy   . Personal history of radiation therapy 2017   LEFT lumpectomy w/ radiation  . RLS  (restless legs syndrome)   . Shingles    Hx of  . 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[place]) 2006  . Wears dentures    partial bottom   Past Surgical History:  Procedure Laterality Date  . APPENDECTOMY    . BREAST BIOPSY Left 02/25/2015   INVASIVE MAMMARY CARCINOMA,Triple negative.  [name] BREAST CYST ASPIRATION    . BREAST EXCISIONAL BIOPSY Left 12/2015   surgery  . BREAST LUMPECTOMY WITH NEEDLE LOCALIZATION Left 10/02/2015   Procedure: BREAST LUMPECTOMY WITH NEEDLE LOCALIZATION;  Surgeon: [name], MD;  Location: [facility];  Service: General;  Laterality: Left;  . BREAST LUMPECTOMY WITH SENTINEL LYMPH NODE BIOPSY Left 10/02/2015   Procedure: LEFT BREAST WIDE EXCISION WITH SENTINEL LYMPH NODE BX;  Surgeon: [name], MD;  Location: [facility];  Service: General;  Laterality: Left;  . BREAST SURGERY     breast biopsy benign  . CARPAL TUNNEL RELEASE Right 05/19/2017   Procedure: CARPAL TUNNEL RELEASE;  Surgeon: Earnestine Leys, MD;  Location: [facility];  Service: Orthopedics;  Laterality: Right;  . CATARACT EXTRACTION W/PHACO Right 10/26/2018   Procedure: CATARACT EXTRACTION PHACO AND INTRAOCULAR LENS PLACEMENT ([place]) right diabetic;  Surgeon: Leandrew Koyanagi, MD;  Location: [place];  Service: Ophthalmology;  Laterality: Right;  Diabetic - oral meds cancer center been notified and will come  . CATARACT EXTRACTION W/PHACO Left 11/16/2018   Procedure: CATARACT EXTRACTION PHACO AND INTRAOCULAR LENS PLACEMENT ([facility]) LEFT DIABETIC  01:01.0  17.0%  10.37;  Surgeon: Leandrew Koyanagi, MD;  Location: [place];  Service: Ophthalmology;  Laterality: Left;  Diabetic - oral meds Port-a-cath  . CHOLECYSTECTOMY    . Cologuard  07/22/2016   Negative  . COLONOSCOPY WITH PROPOFOL N/A 05/24/2020   Procedure: COLONOSCOPY WITH PROPOFOL;  Surgeon: Virgel Manifold, MD;  Location: [facility];  Service: Endoscopy;  Laterality: N/A;  . ESOPHAGOGASTRODUODENOSCOPY  07/2009   normal with few  gastric polyps  . ESOPHAGOGASTRODUODENOSCOPY (EGD) WITH PROPOFOL N/A 05/24/2020   Procedure: ESOPHAGOGASTRODUODENOSCOPY (EGD) WITH PROPOFOL;  Surgeon: Virgel Manifold, MD;  Location: [facility];  Service: Endoscopy;  Laterality: N/A;  . MOLE REMOVAL    . PORT-A-CATH REMOVAL    . PORTACATH PLACEMENT    . PORTACATH PLACEMENT Right 03/12/2015   Procedure: INSERTION PORT-A-CATH;  Surgeon: [name], MD;  Location: [facility];  Service: General;  Laterality: Right;  . RADICAL NECK DISSECTION    . TONSILLECTOMY     cancer treated with chemo and radiation  . TRIGGER FINGER RELEASE Right 05/19/2017   Procedure: RELEASE TRIGGER FINGER/A-1 PULLEY ring finger;  Surgeon: Earnestine Leys, MD;  Location: [facility];  Service: Orthopedics;  Laterality: Right;   Social History   Tobacco Use  . Smoking status: Former Smoker    Packs/day: 0.50    Years: 6.00    Pack years: 3.00    Types: Cigarettes    Quit date: 03/10/1984    Years since quitting: 36.2  . Smokeless tobacco: Never  Used  Vaping Use  . Vaping Use: Never used  Substance Use Topics  . Alcohol use: No    Alcohol/week: 0.0 standard drinks  . Drug use: No   Family History  Problem Relation Age of Onset  . Osteoporosis Mother   . Hyperlipidemia Mother   . Depression Mother   . Cancer Mother        skin cancer ? basal cell and lung ca heavy smoker  . Alcohol abuse Father   . Cancer Father        skin CA ? basal cell  . Heart disease Father        CHF  . Hyperlipidemia Brother   . Hypertension Brother   . Breast cancer Neg Hx    Allergies  Allergen Reactions  . Amoxicillin Swelling    REACTION: rash, swelling Has patient had a PCN reaction causing immediate rash, facial/tongue/throat swelling, SOB or lightheadedness with hypotension: yes Has patient had a PCN reaction causing severe rash involving mucus membranes or skin necrosis: no Has patient had a PCN reaction that required hospitalization: no Has patient had a  PCN reaction occurring within the last 10 years: yes If all of the above answers are "NO", then may proceed with Cephalosporin use.  Has tolerated ceftriaxone  . Penicillin G Anaphylaxis    Has tolerated ceftriaxone on multiple occasions   . Fluconazole Rash    REACTION: hives  . Difuleron [Ferrous Fumarate-Dss]   . 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 acetaminophen (TYLENOL) 325 MG tablet Take 2 tablets (650 mg total) by mouth every 6 (six) hours as needed for mild pain (or Fever >/= 101).    [name] albuterol (VENTOLIN HFA) 108 (90 Base) MCG/ACT inhaler INHALE TWO PUFFS BY MOUTH EVERY SIX HOURS AS NEEDED 8.5 g 3  . ALPRAZolam (XANAX) 0.5 MG tablet TAKE ONE TABLET BY MOUTH twice daily AS NEEDED FOR ANXIETY OR SLEEP 60 tablet 1  . amitriptyline (ELAVIL) 50 MG tablet Take 1 tablet (50 mg total) by mouth at bedtime. 90 tablet 3  . amLODipine (NORVASC) 5 MG tablet Take 1 tablet (5 mg total) by mouth daily. 90 tablet 1  . atorvastatin (LIPITOR) 10 MG tablet TAKE 1 TABLET(10 MG) BY MOUTH DAILY 90 tablet 3  . b complex vitamins tablet Take 1 tablet by mouth daily.    . Cholecalciferol (VITAMIN D-1000 MAX ST) 25 MCG (1000 UT) tablet Take 2,000 Units by mouth daily. Takes three 50 mcg tablets, 3 capsules daily    . ferrous sulfate 325 (65 FE) MG tablet Take 1 tablet (325 mg total) by mouth 2 (two) times daily with a meal.    . glipiZIDE (GLUCOTROL) 5 MG tablet Take 5 mg by mouth 2 (two) times daily.    [name] glucose blood test strip To check blood glucose once daily and prn for diabetes 100 each 3  . JANUMET XR 50-1000 MG TB24 Take 1 tablet by mouth 2 (two) times daily.   0  . lidocaine-prilocaine (EMLA) cream Apply 1 application topically as needed. Apply to port when going through Chemo    . Multiple Vitamins-Minerals (WOMENS MULTIVITAMIN PO) Take  by mouth.    [name] omeprazole (PRILOSEC) 20 MG capsule TAKE ONE CAPSULE BY MOUTH EVERYDAY AT BEDTIME 90 capsule 0  . rOPINIRole (REQUIP) 1  MG tablet TAKE 1/2 TABLET BY MOUTH EVERY MORNING and TAKE THREE TABLETS BY MOUTH EVERYDAY AT BEDTIME 315 tablet 5  . sertraline (ZOLOFT) 100 MG tablet TAKE TWO TABLETS BY MOUTH EVERY MORNING 60 tablet 1  . Suvorexant (BELSOMRA) 20 MG TABS Take 1 tablet by mouth at bedtime. 30 tablet 2  . traZODone (DESYREL) 50 MG tablet Take 1/2-2 tabs po QHS prn insomnia 60 tablet 1  . busPIRone (BUSPAR) 15 MG tablet Take 1 tablet (15 mg total) by mouth 2 (two) times daily. 60 tablet 5  . pregabalin (LYRICA) 75 MG capsule Take 1 capsule (75 mg total) by mouth daily. 30 capsule 1   Current Facility-Administered Medications on File Prior to Visit  Medication Dose Route Frequency Provider Last Rate Last Admin  . sodium chloride flush (NS) 0.9 % injection 10 mL  10 mL Intravenous PRN Cammie Sickle, MD   10 mL at 09/11/15 0845     Review of Systems  Constitutional: Positive for fatigue. Negative for activity change, appetite change, fever and unexpected weight change.  HENT: Negative for congestion, ear pain, rhinorrhea, sinus pressure and sore throat.   Eyes: Negative for pain, redness and visual disturbance.  Respiratory: Positive for cough. Negative for chest tightness, shortness of breath, wheezing and stridor.   Cardiovascular: Negative for chest pain and palpitations.  Gastrointestinal: Negative for abdominal pain, blood in stool, constipation and diarrhea.  Endocrine: Negative for polydipsia and polyuria.  Genitourinary: Negative for dysuria, frequency and urgency.  Musculoskeletal: Negative for arthralgias, back pain and myalgias.  Skin: Negative for pallor and rash.  Allergic/Immunologic: Negative for environmental allergies.  Neurological: Negative for dizziness, syncope and headaches.  Hematological: Negative for adenopathy. Does not bruise/bleed easily.   Psychiatric/Behavioral: Positive for sleep disturbance. Negative for decreased concentration and dysphoric mood. The patient is not nervous/anxious.        Objective:   Physical Exam Constitutional:      General: She is not in acute distress.    Appearance: Normal appearance. She is well-developed. She is obese. She is not ill-appearing or diaphoretic.  HENT:     Head: Normocephalic and atraumatic.  Eyes:     Conjunctiva/sclera: Conjunctivae normal.     Pupils: Pupils are equal, round, and reactive to light.  Neck:     Thyroid: No thyromegaly.     Vascular: No carotid bruit or JVD.  Cardiovascular:     Rate and Rhythm: Normal rate and regular rhythm.     Heart sounds: Normal heart sounds. No gallop.   Pulmonary:     Effort: Pulmonary effort is normal. No respiratory distress.     Breath sounds: Normal breath sounds. No stridor. No wheezing, rhonchi or rales.     Comments: Mildly distant bs at bases  No stridor or rales or crackles  Good air exch No wheeze even on forced exp Abdominal:     General: Bowel sounds are normal. There is no distension or abdominal bruit.     Palpations: Abdomen is soft. There is no mass.     Tenderness: There is no abdominal tenderness.  Musculoskeletal:     Cervical back: Normal range of motion and neck supple.     Right lower leg: No edema.     Left lower leg: No edema.  Lymphadenopathy:     Cervical: No cervical adenopathy.  Skin:    General: Skin is warm and dry.     Coloration: Skin is not pale.     Findings: No  erythema or rash.  Neurological:     Mental Status: She is alert.     Deep Tendon Reflexes: Reflexes are normal and symmetric.  Psychiatric:        Mood and Affect: Mood normal.           Assessment & Plan:   Problem List Items Addressed This Visit      Cardiovascular and Mediastinum   Hypertension    Stable bp on amlodipine s/p hosp for CAP bp in fair control at this time  BP Readings from Last 1 Encounters:   06/11/20 136/78   No changes needed Most recent labs reviewed  Disc lifstyle change with low sodium diet and exercise          Respiratory   Aspiration pneumonia ([place]) - Primary    S/p hospitalization for this with sepsis Reviewed hospital records, lab results and studies in detail   Much clinical improvement and done with abx  Planning re check of cxr in 2 weeks  Watch for sob or cough or malaise  Stressed importance of aspiration precautions and dysphagia 3 diet         Digestive   Dysphagia    Likely caused by past tonsillar cancer treatment  Pt inst re: dysphagia 3 diet and is being careful/has had speech tx eval  2 episodes of aspiration pna and discussed what to watch for in the future  Recent EGD -no esoph issues        Other   Anemia, iron deficiency    This worsened recently in the hospital for pna /sepsis  No cause found on recent colonoscopy and EGD rev with pt today  One iron inf inpatient  Will re check cbc in 2 wk  Continues oral iron bid  Will f/u with oncology early if needed       Swallowing dysfunction    Caused by prev cancer treatment  2 episodes of aspiration pna  Need to watch carefully  Plan to continue dysphagia 3 diet

## 2020-06-11 NOTE — Assessment & Plan Note (Signed)
This worsened recently in the hospital for pna /sepsis  No cause found on recent colonoscopy and EGD rev with pt today  One iron inf inpatient  Will re check cbc in 2 wk  Continues oral iron bid  Will f/u with oncology early if needed

## 2020-06-11 NOTE — Patient Instructions (Addendum)
Continue your iron  Follow up with hematology for anemia   Let's check labs for cbc and another chest xray in 2 weeks  Stop up front to schedule that   Keep following the dysphagia diet  If symptoms return go to the ER

## 2020-06-11 NOTE — Assessment & Plan Note (Signed)
Stable bp on amlodipine s/p hosp for CAP bp in fair control at this time  BP Readings from Last 1 Encounters:  06/11/20 136/78   No changes needed Most recent labs reviewed  Disc lifstyle change with low sodium diet and exercise

## 2020-06-11 NOTE — Assessment & Plan Note (Signed)
Caused by prev cancer treatment  2 episodes of aspiration pna  Need to watch carefully  Plan to continue dysphagia 3 diet

## 2020-06-11 NOTE — Assessment & Plan Note (Signed)
S/p hospitalization for this with sepsis Reviewed hospital records, lab results and studies in detail   Much clinical improvement and done with abx  Planning re check of cxr in 2 weeks  Watch for sob or cough or malaise  Stressed importance of aspiration precautions and dysphagia 3 diet

## 2020-06-13 ENCOUNTER — Telehealth: Payer: Self-pay | Admitting: *Deleted

## 2020-06-13 MED ORDER — ATORVASTATIN CALCIUM 10 MG PO TABS: ORAL_TABLET | ORAL | 0 refills | Status: DC

## 2020-06-13 NOTE — Progress Notes (Signed)
Refill for atorvastatin requested from Dr. Alba Cory office 06/13/20.

## 2020-06-13 NOTE — Telephone Encounter (Signed)
-----   Message from [facility] sent at 06/13/2020 10:10 AM EDT ----- Regarding: Refill Patient needs refill on Atorvastatin. If refill appropriate please send to [facility].  Thank you,  Margaretmary Dys, [facility] Assistant [phone]

## 2020-06-18 ENCOUNTER — Encounter: Payer: Self-pay | Admitting: Family Medicine

## 2020-06-18 ENCOUNTER — Other Ambulatory Visit: Payer: Self-pay

## 2020-06-18 ENCOUNTER — Ambulatory Visit (INDEPENDENT_AMBULATORY_CARE_PROVIDER_SITE_OTHER): Payer: PPO | Admitting: Family Medicine

## 2020-06-18 ENCOUNTER — Ambulatory Visit (INDEPENDENT_AMBULATORY_CARE_PROVIDER_SITE_OTHER)
Admission: RE | Admit: 2020-06-18 | Discharge: 2020-06-18 | Disposition: A | Discharge status: Home / Self Care | Payer: PPO | Source: Ambulatory Visit | Attending: Family Medicine | Admitting: Family Medicine

## 2020-06-18 VITALS — BP 124/60 | HR 83 | Temp 96.9°F | Ht 66.0 in | Wt 224.0 lb

## 2020-06-18 DIAGNOSIS — Z8701 Personal history of pneumonia (recurrent): Secondary | ICD-10-CM | POA: Diagnosis not present

## 2020-06-18 DIAGNOSIS — E538 Deficiency of other specified B group vitamins: Secondary | ICD-10-CM

## 2020-06-18 DIAGNOSIS — D509 Iron deficiency anemia, unspecified: Secondary | ICD-10-CM | POA: Diagnosis not present

## 2020-06-18 DIAGNOSIS — K219 Gastro-esophageal reflux disease without esophagitis: Secondary | ICD-10-CM | POA: Diagnosis not present

## 2020-06-18 DIAGNOSIS — E559 Vitamin D deficiency, unspecified: Secondary | ICD-10-CM | POA: Diagnosis not present

## 2020-06-18 DIAGNOSIS — M949 Disorder of cartilage, unspecified: Secondary | ICD-10-CM | POA: Diagnosis not present

## 2020-06-18 DIAGNOSIS — I10 Essential (primary) hypertension: Secondary | ICD-10-CM

## 2020-06-18 DIAGNOSIS — J690 Pneumonitis due to inhalation of food and vomit: Secondary | ICD-10-CM

## 2020-06-18 DIAGNOSIS — E7800 Pure hypercholesterolemia, unspecified: Secondary | ICD-10-CM | POA: Diagnosis not present

## 2020-06-18 DIAGNOSIS — M899 Disorder of bone, unspecified: Secondary | ICD-10-CM | POA: Diagnosis not present

## 2020-06-18 DIAGNOSIS — I700 Atherosclerosis of aorta: Secondary | ICD-10-CM | POA: Diagnosis not present

## 2020-06-18 LAB — CBC WITH DIFFERENTIAL/PLATELET
Basophils Absolute: 0 10*3/uL (ref 0.0–0.1)
Basophils Relative: 0.8 % (ref 0.0–3.0)
Eosinophils Absolute: 0.1 10*3/uL (ref 0.0–0.7)
Eosinophils Relative: 3.6 % (ref 0.0–5.0)
HCT: 33.3 % — ABNORMAL LOW (ref 36.0–46.0)
Hemoglobin: 10.6 g/dL — ABNORMAL LOW (ref 12.0–15.0)
Lymphocytes Relative: 22.3 % (ref 12.0–46.0)
Lymphs Abs: 0.9 10*3/uL (ref 0.7–4.0)
MCHC: 31.8 g/dL (ref 30.0–36.0)
MCV: 86.5 fl (ref 78.0–100.0)
Monocytes Absolute: 0.2 10*3/uL (ref 0.1–1.0)
Monocytes Relative: 6.3 % (ref 3.0–12.0)
Neutro Abs: 2.6 10*3/uL (ref 1.4–7.7)
Neutrophils Relative %: 67 % (ref 43.0–77.0)
Platelets: 186 10*3/uL (ref 150.0–400.0)
RBC: 3.85 Mil/uL — ABNORMAL LOW (ref 3.87–5.11)
RDW: 18.5 % — ABNORMAL HIGH (ref 11.5–15.5)
WBC: 3.8 10*3/uL — ABNORMAL LOW (ref 4.0–10.5)

## 2020-06-18 LAB — COMPREHENSIVE METABOLIC PANEL
ALT: 13 U/L (ref 0–35)
AST: 16 U/L (ref 0–37)
Albumin: 3.6 g/dL (ref 3.5–5.2)
Alkaline Phosphatase: 91 U/L (ref 39–117)
BUN: 14 mg/dL (ref 6–23)
CO2: 28 mEq/L (ref 19–32)
Calcium: 8.5 mg/dL (ref 8.4–10.5)
Chloride: 102 mEq/L (ref 96–112)
Creatinine, Ser: 0.81 mg/dL (ref 0.40–1.20)
GFR: 72.46 mL/min (ref >60.00)
Glucose, Bld: 176 mg/dL — ABNORMAL HIGH (ref 70–99)
Potassium: 4.5 mEq/L (ref 3.5–5.1)
Sodium: 139 mEq/L (ref 135–145)
Total Bilirubin: 0.2 mg/dL (ref 0.2–1.2)
Total Protein: 6.8 g/dL (ref 6.0–8.3)

## 2020-06-18 LAB — VITAMIN D 25 HYDROXY (VIT D DEFICIENCY, FRACTURES): VITD: 38.72 ng/mL (ref 30.00–100.00)

## 2020-06-18 LAB — IRON: Iron: 68 ug/dL (ref 42–145)

## 2020-06-18 LAB — TSH: TSH: 2.57 u[IU]/mL (ref 0.35–4.50)

## 2020-06-18 MED ORDER — OMEPRAZOLE 20 MG PO CPDR: DELAYED_RELEASE_CAPSULE | ORAL | 3 refills | Status: DC

## 2020-06-18 MED ORDER — ATORVASTATIN CALCIUM 10 MG PO TABS: ORAL_TABLET | ORAL | 3 refills | Status: DC

## 2020-06-18 MED ORDER — ALBUTEROL SULFATE HFA 108 (90 BASE) MCG/ACT IN AERS: INHALATION_SPRAY | RESPIRATORY_TRACT | 3 refills | Status: DC

## 2020-06-18 MED ORDER — ROPINIROLE HCL 1 MG PO TABS: ORAL_TABLET | ORAL | 5 refills | Status: DC

## 2020-06-18 MED ORDER — CYANOCOBALAMIN 1000 MCG/ML IJ SOLN
1000.0000 ug | Freq: Once | INTRAMUSCULAR | Status: AC
  Administered 2020-06-18: 1000 ug via INTRAMUSCULAR

## 2020-06-18 MED ORDER — AMLODIPINE BESYLATE 5 MG PO TABS: 5.0000 mg | ORAL_TABLET | Freq: Every day | ORAL | 3 refills | Status: DC

## 2020-06-18 NOTE — Assessment & Plan Note (Signed)
Now taking ferrous sulfate bid  Cbc and iron level today  If not imp will need earlier f/u with heme/onc

## 2020-06-18 NOTE — Assessment & Plan Note (Signed)
Lab Results  Component Value Date   VITAMINB12 218 05/29/2020   This has drifted down  B12 shot now and then every 2 mo in addn to oral supplementation

## 2020-06-18 NOTE — Assessment & Plan Note (Signed)
bp in fair control at this time  BP Readings from Last 1 Encounters:  06/18/20 124/60   No changes needed Most recent labs reviewed  Disc lifstyle change with low sodium diet and exercise  Plan to continue amlodipine 5 mg daily

## 2020-06-18 NOTE — Progress Notes (Signed)
Subjective:    Patient ID: Suzanne Strong, female    DOB: 1945/10/14, 75 y.o.   MRN: [id number]  This visit occurred during the SARS-CoV-2 public health emergency.  Safety protocols were in place, including screening questions prior to the visit, additional usage of staff PPE, and extensive cleaning of exam room while observing appropriate contact time as indicated for disinfecting solutions.    HPI Pt presents for f/u of HTN and other chronic health problems   Wt Readings from Last 3 Encounters:  06/18/20 224 lb (101.6 kg)  06/11/20 217 lb 2 oz (98.5 kg)  05/28/20 222 lb (100.7 kg)   36.15 kg/m Still tired   HTN bp is stable today  No cp or palpitations or headaches or edema  No side effects to medicines  BP Readings from Last 3 Encounters:  06/18/20 124/60  06/11/20 136/78  05/31/20 (!) 172/74    Taking amlodipine 5 mg daily   Recent hosp for aspiration pna  Due for cxr at 2 wk Not coughing today / did yesterday  Still tired  Sob improving gradually  Uses incentive spirometry- doing well with getting to level desired    Takes omeprazole 20 mg for GERD Watches swallowing closely  Dysphagia diet   Sees endocrinology for DM2  Iron def anemia Lab Results  Component Value Date   WBC 6.0 05/31/2020   HGB 8.9 (L) 05/31/2020   HCT 27.5 (L) 05/31/2020   MCV 84.1 05/31/2020   PLT 214 05/31/2020   Sees oncology  Due for re check  Taking iron bid (ferrous sulfate)   B12 def Lab Results  Component Value Date   VITAMINB12 218 05/29/2020  takes B complex vitamin  Stopped shots when level was   Hyperlipidemia Lab Results  Component Value Date   CHOL 121 02/16/2020   HDL 48 02/16/2020   LDLCALC 57 02/16/2020   LDLDIRECT 97.0 06/29/2017   TRIG 78 02/16/2020   CHOLHDL 2.5 02/16/2020   Taking atorvastatin 10 mg daily   Vit D def  Taking mvi /no extra D  Osteopenia 9/21 dexa   Patient Active Problem List   Diagnosis Date Noted  . Dysphagia   .  Gastric erythema   . Gastric polyp   . Encounter for screening colonoscopy   . Polyp of colon   . Aspiration pneumonia ([place]) 02/15/2020  . Diabetes mellitus without complication ([place])   . GERD (gastroesophageal reflux disease)   . Hypertension   . Elevated troponin   . Urinary urgency 01/05/2020  . Grief reaction 09/18/2019  . Swallowing dysfunction 07/18/2019  . Fever, intermittent 09/01/2018  . Pre-syncope 02/07/2018  . Orthostatic hypotension 01/26/2018  . Episodic weakness 01/26/2018  . Medicare annual wellness visit, subsequent 06/29/2017  . 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 Preoperative examination 04/27/2017  . Carpal tunnel syndrome of right wrist 11/13/2016  . 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 Obstructive sleep apnea 01/01/2016  . Carcinoma of upper-outer quadrant of left breast in female, estrogen receptor negative ([place]) 11/21/2015  . Hypomagnesemia 08/28/2015  . Initial Medicare annual wellness visit 10/16/2014  . Estrogen deficiency 10/16/2014  . Colon cancer screening 10/16/2014  . Controlled type 2 diabetes mellitus without complication ([place]) 29/52/8413  . RLS (restless legs syndrome) 02/21/2014  . Chronic neck pain 07/25/2010  . Depression with anxiety 07/07/2010  . Routine general medical examination at a health care facility 06/26/2010  . B12 deficiency 09/04/2009  . Anemia, iron deficiency 08/29/2009  .  Anxiety state 08/26/2009  . GASTROPARESIS 08/26/2009  . Vitamin D deficiency 07/19/2008  . Disorder of bone and cartilage 07/19/2007  . Hyperlipidemia 07/18/2007  . EDEMA 07/18/2007  . Skin lesion, superficial 06/02/2007   Past Medical History:  Diagnosis Date  . Anemia    iron deficiency and B12 deficiency  . Anxiety   . Breast cancer (HCC) 02/25/2015   upper-outer quadrant of left female breast, Triple Negative. Neo-adjuvant chemo with complete pathologic response.   . Cervical dysplasia    conization  . Diabetes  mellitus without complication (HCC)   . Diverticulosis   . Fever 04/11/2015  . Gastroparesis    related to previous radiation tx  . GERD (gastroesophageal reflux disease)   . Hyperlipidemia   . Hypertension   . Neuropathy    legs/feet, S/P chemo meds  . Personal history of chemotherapy   . Personal history of radiation therapy 2017   LEFT lumpectomy w/ radiation  . RLS (restless legs syndrome)   . Shingles    Hx of  . 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HCC) 2006  . Wears dentures    partial bottom   Past Surgical History:  Procedure Laterality Date  . APPENDECTOMY    . BREAST BIOPSY Left 02/25/2015   INVASIVE MAMMARY CARCINOMA,Triple negative.  [name] BREAST CYST ASPIRATION    . BREAST EXCISIONAL BIOPSY Left 12/2015   surgery  . BREAST LUMPECTOMY WITH NEEDLE LOCALIZATION Left 10/02/2015   Procedure: BREAST LUMPECTOMY WITH NEEDLE LOCALIZATION;  Surgeon: Earline Mayotte, MD;  Location: [facility];  Service: General;  Laterality: Left;  . BREAST LUMPECTOMY WITH SENTINEL LYMPH NODE BIOPSY Left 10/02/2015   Procedure: LEFT BREAST WIDE EXCISION WITH SENTINEL LYMPH NODE BX;  Surgeon: Earline Mayotte, MD;  Location: [facility];  Service: General;  Laterality: Left;  . BREAST SURGERY     breast biopsy benign  . CARPAL TUNNEL RELEASE Right 05/19/2017   Procedure: CARPAL TUNNEL RELEASE;  Surgeon: Deeann Saint, MD;  Location: [facility];  Service: Orthopedics;  Laterality: Right;  . CATARACT EXTRACTION W/PHACO Right 10/26/2018   Procedure: CATARACT EXTRACTION PHACO AND INTRAOCULAR LENS PLACEMENT (IOC) right diabetic;  Surgeon: Lockie Mola, MD;  Location: [facility];  Service: Ophthalmology;  Laterality: Right;  Diabetic - oral meds cancer center been notified and will come  . CATARACT EXTRACTION W/PHACO Left 11/16/2018   Procedure: CATARACT EXTRACTION PHACO AND INTRAOCULAR LENS PLACEMENT (IOC) LEFT DIABETIC  01:01.0  17.0%  10.37;  Surgeon: Lockie Mola, MD;  Location: [facility];   Service: Ophthalmology;  Laterality: Left;  Diabetic - oral meds Port-a-cath  . CHOLECYSTECTOMY    . Cologuard  07/22/2016   Negative  . COLONOSCOPY WITH PROPOFOL N/A 05/24/2020   Procedure: COLONOSCOPY WITH PROPOFOL;  Surgeon: Pasty Spillers, MD;  Location: [facility];  Service: Endoscopy;  Laterality: N/A;  . ESOPHAGOGASTRODUODENOSCOPY  07/2009   normal with few gastric polyps  . ESOPHAGOGASTRODUODENOSCOPY (EGD) WITH PROPOFOL N/A 05/24/2020   Procedure: ESOPHAGOGASTRODUODENOSCOPY (EGD) WITH PROPOFOL;  Surgeon: Pasty Spillers, MD;  Location: [facility];  Service: Endoscopy;  Laterality: N/A;  . MOLE REMOVAL    . PORT-A-CATH REMOVAL    . PORTACATH PLACEMENT    . PORTACATH PLACEMENT Right 03/12/2015   Procedure: INSERTION PORT-A-CATH;  Surgeon: Earline Mayotte, MD;  Location: [facility];  Service: General;  Laterality: Right;  . RADICAL NECK DISSECTION    . TONSILLECTOMY     cancer treated with chemo and radiation  . TRIGGER FINGER RELEASE Right  05/19/2017   Procedure: RELEASE TRIGGER FINGER/A-1 PULLEY ring finger;  Surgeon: Earnestine Leys, MD;  Location: [facility];  Service: Orthopedics;  Laterality: Right;   Social History   Tobacco Use  . Smoking status: Former Smoker    Packs/day: 0.50    Years: 6.00    Pack years: 3.00    Types: Cigarettes    Quit date: 03/10/1984    Years since quitting: 36.2  . Smokeless tobacco: Never Used  Vaping Use  . Vaping Use: Never used  Substance Use Topics  . Alcohol use: No    Alcohol/week: 0.0 standard drinks  . Drug use: No   Family History  Problem Relation Age of Onset  . Osteoporosis Mother   . Hyperlipidemia Mother   . Depression Mother   . Cancer Mother        skin cancer ? basal cell and lung ca heavy smoker  . Alcohol abuse Father   . Cancer Father        skin CA ? basal cell  . Heart disease Father        CHF  . Hyperlipidemia Brother   . Hypertension Brother   . Breast cancer Neg Hx    Allergies  Allergen  Reactions  . Amoxicillin Swelling    REACTION: rash, swelling Has patient had a PCN reaction causing immediate rash, facial/tongue/throat swelling, SOB or lightheadedness with hypotension: yes Has patient had a PCN reaction causing severe rash involving mucus membranes or skin necrosis: no Has patient had a PCN reaction that required hospitalization: no Has patient had a PCN reaction occurring within the last 10 years: yes If all of the above answers are "NO", then may proceed with Cephalosporin use.  Has tolerated ceftriaxone  . Penicillin G Anaphylaxis    Has tolerated ceftriaxone on multiple occasions   . Fluconazole Rash    REACTION: hives  . Difuleron [Ferrous Fumarate-Dss]   . 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 acetaminophen (TYLENOL) 325 MG tablet Take 2 tablets (650 mg total) by mouth every 6 (six) hours as needed for mild pain (or Fever >/= 101).    [name] ALPRAZolam (XANAX) 0.5 MG tablet TAKE ONE TABLET BY MOUTH twice daily AS NEEDED FOR ANXIETY OR SLEEP 60 tablet 1  . amitriptyline (ELAVIL) 50 MG tablet Take 1 tablet (50 mg total) by mouth at bedtime. 90 tablet 3  . b complex vitamins tablet Take 1 tablet by mouth daily.    . Cholecalciferol (VITAMIN D-1000 MAX ST) 25 MCG (1000 UT) tablet Take 2,000 Units by mouth daily. Takes three 50 mcg tablets, 3 capsules daily    . ferrous sulfate 325 (65 FE) MG tablet Take 1 tablet (325 mg total) by mouth 2 (two) times daily with a meal.    . glipiZIDE (GLUCOTROL) 5 MG tablet Take 5 mg by mouth 2 (two) times daily.    [name] glucose blood test strip To check blood glucose once daily and prn for diabetes 100 each 3  . JANUMET XR 50-1000 MG TB24 Take 1 tablet by mouth 2 (two) times daily.   0  . lidocaine-prilocaine (EMLA) cream Apply 1 application topically as needed. Apply to port when  going through Chemo    . Multiple Vitamins-Minerals (WOMENS MULTIVITAMIN PO) Take by mouth.    . sertraline (ZOLOFT)  100 MG tablet TAKE TWO TABLETS BY MOUTH EVERY MORNING 60 tablet 1  . Suvorexant (BELSOMRA) 20 MG TABS Take 1 tablet by mouth at bedtime. 30 tablet 2  . traZODone (DESYREL) 50 MG tablet Take 1/2-2 tabs po QHS prn insomnia 60 tablet 1  . busPIRone (BUSPAR) 15 MG tablet Take 1 tablet (15 mg total) by mouth 2 (two) times daily. 60 tablet 5  . pregabalin (LYRICA) 75 MG capsule Take 1 capsule (75 mg total) by mouth daily. 30 capsule 1   Current Facility-Administered Medications on File Prior to Visit  Medication Dose Route Frequency Provider Last Rate Last Admin  . sodium chloride flush (NS) 0.9 % injection 10 mL  10 mL Intravenous PRN Cammie Sickle, MD   10 mL at 09/11/15 0845    Review of Systems  Constitutional: Positive for fatigue. Negative for activity change, appetite change, fever and unexpected weight change.  HENT: Positive for trouble swallowing. Negative for congestion, ear pain, rhinorrhea, sinus pressure and sore throat.        Dry mouth   Eyes: Negative for pain, redness and visual disturbance.  Respiratory: Positive for cough. Negative for chest tightness, shortness of breath, wheezing and stridor.   Cardiovascular: Negative for chest pain and palpitations.  Gastrointestinal: Negative for abdominal pain, blood in stool, constipation and diarrhea.  Endocrine: Negative for polydipsia and polyuria.  Genitourinary: Negative for dysuria, frequency and urgency.  Musculoskeletal: Negative for arthralgias, back pain, joint swelling and myalgias.  Skin: Negative for pallor and rash.  Allergic/Immunologic: Negative for environmental allergies.  Neurological: Negative for dizziness, syncope and headaches.  Hematological: Negative for adenopathy. Does not bruise/bleed easily.  Psychiatric/Behavioral: Positive for dysphoric mood. Negative for decreased concentration.  The patient is nervous/anxious.        Objective:   Physical Exam Constitutional:      General: She is not in acute distress.    Appearance: Normal appearance. She is well-developed. She is obese. She is not ill-appearing or diaphoretic.  HENT:     Head: Normocephalic and atraumatic.  Eyes:     Conjunctiva/sclera: Conjunctivae normal.     Pupils: Pupils are equal, round, and reactive to light.  Neck:     Thyroid: No thyromegaly.     Vascular: No carotid bruit or JVD.  Cardiovascular:     Rate and Rhythm: Normal rate and regular rhythm.     Heart sounds: Normal heart sounds. No gallop.   Pulmonary:     Effort: Pulmonary effort is normal. No respiratory distress.     Breath sounds: Normal breath sounds. No stridor. No wheezing, rhonchi or rales.     Comments: Some faint crackles at both bases  No wheeze or stridor Abdominal:     General: Bowel sounds are normal. There is no distension or abdominal bruit.     Palpations: Abdomen is soft. There is no mass.     Tenderness: There is no abdominal tenderness.  Musculoskeletal:     Cervical back: Normal range of motion and neck supple.  Lymphadenopathy:     Cervical: No cervical adenopathy.  Skin:    General: Skin is warm and dry.     Coloration: Skin is not jaundiced.     Findings: No erythema or rash.     Comments: Fair complexion w/o pallor  Neurological:     Mental Status: She is alert.     Deep Tendon Reflexes: Reflexes are normal and symmetric.  Psychiatric:  Mood and Affect: Mood is anxious.     Comments: Mildly anxious            Assessment & Plan:   Problem List Items Addressed This Visit      Cardiovascular and Mediastinum   Hypertension    bp in fair control at this time  BP Readings from Last 1 Encounters:  06/18/20 124/60   No changes needed Most recent labs reviewed  Disc lifstyle change with low sodium diet and exercise  Plan to continue amlodipine 5 mg daily      Relevant Medications    amLODipine (NORVASC) 5 MG tablet   atorvastatin (LIPITOR) 10 MG tablet   Other Relevant Orders   Comprehensive metabolic panel   TSH     Respiratory   Aspiration pneumonia ([place])    Continues to improve clinically  Some fatigue and poor exercise tolerance Xray chest today        Relevant Medications   albuterol (VENTOLIN HFA) 108 (90 Base) MCG/ACT inhaler   Other Relevant Orders   DG Chest 2 View     Digestive   GERD (gastroesophageal reflux disease)    Continues nexium Will need to supplement B12       Relevant Medications   omeprazole (PRILOSEC) 20 MG capsule     Musculoskeletal and Integument   Disorder of bone and cartilage    dexa 9/21 utd  Vit d checked today         Other   B12 deficiency    Lab Results  Component Value Date   VITAMINB12 218 05/29/2020   This has drifted down  B12 shot now and then every 2 mo in addn to oral supplementation       Vitamin D deficiency    Level today  Taking mvi and no extra D  Recommendation to follow Disc imp to bone and overall health      Relevant Orders   VITAMIN D 25 Hydroxy (Vit-D Deficiency, Fractures)   Hyperlipidemia   Relevant Medications   amLODipine (NORVASC) 5 MG tablet   atorvastatin (LIPITOR) 10 MG tablet   Anemia, iron deficiency - Primary    Now taking ferrous sulfate bid  Cbc and iron level today  If not imp will need earlier f/u with heme/onc      Relevant Orders   CBC with Differential/Platelet   Iron

## 2020-06-18 NOTE — Assessment & Plan Note (Signed)
Continues nexium Will need to supplement B12

## 2020-06-18 NOTE — Patient Instructions (Addendum)
B12 shot today  Then every 2 months  Take your B complex vitamin   Lab today  Chest xray today   Will advise further with results

## 2020-06-18 NOTE — Assessment & Plan Note (Signed)
dexa 9/21 utd  Vit d checked today

## 2020-06-18 NOTE — Assessment & Plan Note (Signed)
Continues to improve clinically  Some fatigue and poor exercise tolerance Xray chest today

## 2020-06-18 NOTE — Assessment & Plan Note (Signed)
Level today  Taking mvi and no extra D  Recommendation to follow Disc imp to bone and overall health

## 2020-06-25 ENCOUNTER — Other Ambulatory Visit: Payer: PPO

## 2020-06-25 ENCOUNTER — Other Ambulatory Visit: Payer: Self-pay

## 2020-06-25 ENCOUNTER — Telehealth: Payer: Self-pay

## 2020-06-25 NOTE — Chronic Care Management (AMB) (Addendum)
Chronic Care Management Pharmacy Assistant   Name: KORALYN PRESTAGE  MRN: [id number] DOB: [birth year]   Reason for Encounter: Reminder call   Recent office visits:  06/18/2020 - Dr. [name], PCP - Labs ordered, no medication changes. F/u 2 months  Recent consult visits:  None since last CCM contact  Hospital visits:  None in previous 6 months  Medications: Outpatient Encounter Medications as of 06/25/2020  Medication Sig   acetaminophen (TYLENOL) 325 MG tablet Take 2 tablets (650 mg total) by mouth every 6 (six) hours as needed for mild pain (or Fever >/= 101).   albuterol (VENTOLIN HFA) 108 (90 Base) MCG/ACT inhaler INHALE TWO PUFFS BY MOUTH EVERY SIX HOURS AS NEEDED   ALPRAZolam (XANAX) 0.5 MG tablet TAKE ONE TABLET BY MOUTH twice daily AS NEEDED FOR ANXIETY OR SLEEP   amitriptyline (ELAVIL) 50 MG tablet Take 1 tablet (50 mg total) by mouth at bedtime.   amLODipine (NORVASC) 5 MG tablet Take 1 tablet (5 mg total) by mouth daily.   atorvastatin (LIPITOR) 10 MG tablet TAKE 1 TABLET(10 MG) BY MOUTH DAILY   b complex vitamins tablet Take 1 tablet by mouth daily.   busPIRone (BUSPAR) 15 MG tablet Take 1 tablet (15 mg total) by mouth 2 (two) times daily.   Cholecalciferol (VITAMIN D-1000 MAX ST) 25 MCG (1000 UT) tablet Take 2,000 Units by mouth daily. Takes three 50 mcg tablets, 3 capsules daily   ferrous sulfate 325 (65 FE) MG tablet Take 1 tablet (325 mg total) by mouth 2 (two) times daily with a meal.   glipiZIDE (GLUCOTROL) 5 MG tablet Take 5 mg by mouth 2 (two) times daily.   glucose blood test strip To check blood glucose once daily and prn for diabetes   JANUMET XR 50-1000 MG TB24 Take 1 tablet by mouth 2 (two) times daily.    lidocaine-prilocaine (EMLA) cream Apply 1 application topically as needed. Apply to port when going through Chemo   Multiple Vitamins-Minerals (WOMENS MULTIVITAMIN PO) Take by mouth.   omeprazole (PRILOSEC) 20 MG capsule TAKE ONE CAPSULE BY MOUTH  EVERYDAY AT BEDTIME   pregabalin (LYRICA) 75 MG capsule Take 1 capsule (75 mg total) by mouth daily.   rOPINIRole (REQUIP) 1 MG tablet TAKE 1/2 TABLET BY MOUTH EVERY MORNING and TAKE THREE TABLETS BY MOUTH EVERYDAY AT BEDTIME   sertraline (ZOLOFT) 100 MG tablet TAKE TWO TABLETS BY MOUTH EVERY MORNING   Suvorexant (BELSOMRA) 20 MG TABS Take 1 tablet by mouth at bedtime.   traZODone (DESYREL) 50 MG tablet Take 1/2-2 tabs po QHS prn insomnia   Facility-Administered Encounter Medications as of 06/25/2020  Medication   sodium chloride flush (NS) 0.9 % injection 10 mL    [name] was contacted to remind her of her upcoming telephone visit with [name] on 07/02/2020 at 8:30AM. She was reminded to have all medications, supplements and any blood glucose and blood pressure readings available for review at appointment.   Are you having any problems with your medications? The patient states no problems with the medications  What concerns would you like to discuss with the pharmacist? The patient reports the cost of medication is of concern.   Star Rating Drugs: Medication:  Last Fill: Day Supply Atorvastatin 10mg  06/13/2020 30ds Glipizide 5mg   06/13/2020 30ds  janumet 50-1000mg  06/13/2020 30ds   [name], CPP notified  Avel Sensor, [place] Assistant [phone]  I have reviewed the care management and care coordination activities  outlined in this encounter and I am certifying that I agree with the content of this note. No further action required.  [name], PharmD Clinical Pharmacist [facility] [phone]

## 2020-06-28 ENCOUNTER — Telehealth: Payer: Self-pay | Admitting: Family Medicine

## 2020-06-28 NOTE — Chronic Care Management (AMB) (Signed)
Patient called requesting appointment on 07/02/20 with Sharyn Lull be moved to a later time or another day due to work schedule. Appointment was rescheduled from 8:30 am on 07/02/20 to 4:00 pm on 07/02/20.   Follow-Up:  Scheduled Follow-Up With Clinical Pharmacist  [name], CPP notified  Margaretmary Dys, [place] Pharmacy Assistant [phone]

## 2020-06-28 NOTE — Telephone Encounter (Signed)
Suzanne Strong called in wanted to know about changing her appointment to a later time that day or change it all together.  Please advise

## 2020-07-02 ENCOUNTER — Telehealth: Payer: PPO

## 2020-07-02 NOTE — Progress Notes (Deleted)
Chronic Care Management Pharmacy Note  07/02/2020 Name:  Suzanne Strong MRN:  [id number] DOB:  05/16/1945  Subjective: [name] is an 75 y.o. year old female who is a primary patient of Tower, [name], MD.  The CCM team was consulted for assistance with disease management and care coordination needs.    {CCMTELEPHONEFACETOFACE:21091510} for {CCMINITIALFOLLOWUPCHOICE:21091511} in response to provider referral for pharmacy case management and/or care coordination services.   Consent to Services:  {CCMCONSENTOPTIONS:25074}  Patient Care Team: Tower, [name], MD as PCP - General Byrnett, [name], MD (General Surgery) [name], MD (Inactive) (Oncology) Alda Berthold, DO as Consulting Physician (Neurology) Cammie Sickle, MD as Consulting Physician (Oncology) [name], [facility] (Pharmacist) Noreene Filbert, MD as Radiation Oncologist (Radiation Oncology)  Recent office visits: ***  Recent consult visits: [facility] visits: {Hospital DC Yes/No:25215}  Objective:  Lab Results  Component Value Date   CREATININE 0.81 06/18/2020   BUN 14 06/18/2020   GFR 72.46 06/18/2020   GFRNONAA >60 05/31/2020   GFRAA >60 10/20/2019   NA 139 06/18/2020   K 4.5 06/18/2020   CALCIUM 8.5 06/18/2020   CO2 28 06/18/2020   GLUCOSE 176 (H) 06/18/2020    Lab Results  Component Value Date/Time   HGBA1C 6.7 (H) 05/28/2020 10:38 PM   HGBA1C 6.1 (H) 02/15/2020 09:27 AM   HGBA1C 7.3 12/09/2016 12:00 AM   GFR 72.46 06/18/2020 11:04 AM   GFR 65.12 07/15/2018 09:27 AM    Last diabetic Eye exam:  Lab Results  Component Value Date/Time   HMDIABEYEEXA No Retinopathy 05/02/2020 12:00 AM    Last diabetic Foot exam: No results found for: HMDIABFOOTEX   Lab Results  Component Value Date   CHOL 121 02/16/2020   HDL 48 02/16/2020   LDLCALC 57 02/16/2020   LDLDIRECT 97.0 06/29/2017   TRIG 78 02/16/2020   CHOLHDL 2.5 02/16/2020    Hepatic Function Latest Ref  Rng & Units 06/18/2020 05/28/2020 05/28/2020  Total Protein 6.0 - 8.3 g/dL 6.8 7.3 8.3(H)  Albumin 3.5 - 5.2 g/dL 3.6 3.2(L) 3.9  AST 0 - 37 U/L _0 ALT 0 - 35 U/L _1 Alk Phosphatase 39 - 117 U/L 91 67 77  Total Bilirubin 0.2 - 1.2 mg/dL 0.2 0.4 0.5  Bilirubin, Direct 0.0 - 0.3 mg/dL - - -    Lab Results  Component Value Date/Time   TSH 2.57 06/18/2020 11:04 AM   TSH 2.50 07/15/2018 09:27 AM    CBC Latest Ref Rng & Units 06/18/2020 05/31/2020 05/30/2020  WBC 4.0 - 10.5 K/uL 3.8(L) 6.0 8.5  Hemoglobin 12.0 - 15.0 g/dL 10.6(L) 8.9(L) 8.1(L)  Hematocrit 36.0 - 46.0 % 33.3(L) 27.5(L) 25.9(L)  Platelets 150.0 - 400.0 K/uL 186.0 214 187    Lab Results  Component Value Date/Time   VD25OH 38.72 06/18/2020 11:04 AM   VD25OH 29.74 (L) 07/15/2018 09:27 AM    Clinical ASCVD: {YES/NO:21197} The ASCVD Risk score [name], et al., 2013) failed to calculate for the following reasons:   The valid total cholesterol range is 130 to 320 mg/dL    Depression screen [facility] 2/9 07/15/2018 06/29/2017 06/25/2016  Decreased Interest 0 0 0  Down, Depressed, Hopeless 0 0 0  PHQ - 2 Score 0 0 0  Altered sleeping 0 0 -  Tired, decreased energy 0 0 -  Change in appetite 0 0 -  Feeling bad or failure about yourself  0 0 -  Trouble concentrating 0 0 -  Moving slowly or fidgety/restless 0 0 -  Suicidal thoughts 0 0 -  PHQ-9 Score 0 0 -  Difficult doing work/chores Not difficult at all Not difficult at all -  Some recent data might be hidden     ***Other: (CHADS2VASc if Afib, MMRC or CAT for COPD, ACT, DEXA)  Social History   Tobacco Use  Smoking Status Former Smoker  . Packs/day: 0.50  . Years: 6.00  . Pack years: 3.00  . Types: Cigarettes  . Quit date: 03/10/1984  . Years since quitting: 36.3  Smokeless Tobacco Never Used   BP Readings from Last 3 Encounters:  06/18/20 124/60  06/11/20 136/78  05/31/20 (!) 172/74   Pulse Readings from Last 3 Encounters:  06/18/20 83  06/11/20 80   05/31/20 84   Wt Readings from Last 3 Encounters:  06/18/20 224 lb (101.6 kg)  06/11/20 217 lb 2 oz (98.5 kg)  05/28/20 222 lb (100.7 kg)   BMI Readings from Last 3 Encounters:  06/18/20 36.15 kg/m  06/11/20 35.04 kg/m  05/28/20 35.83 kg/m    Assessment/Interventions: Review of patient past medical history, allergies, medications, health status, including review of consultants reports, laboratory and other test data, was performed as part of comprehensive evaluation and provision of chronic care management services.   SDOH:  (Social Determinants of Health) assessments and interventions performed: {yes/no:20286}  SDOH Screenings   Alcohol Screen: Not on file  Depression (ZSW1-0): Not on file  Financial Resource Strain: Not on file  Food Insecurity: Not on file  Housing: Not on file  Physical Activity: Not on file  Social Connections: Not on file  Stress: Not on file  Tobacco Use: Medium Risk  . Smoking Tobacco Use: Former Smoker  . Smokeless Tobacco Use: Never Used  Transportation Needs: Not on file    CCM Care Plan  Allergies  Allergen Reactions  . Amoxicillin Swelling    REACTION: rash, swelling Has patient had a PCN reaction causing immediate rash, facial/tongue/throat swelling, SOB or lightheadedness with hypotension: yes Has patient had a PCN reaction causing severe rash involving mucus membranes or skin necrosis: no Has patient had a PCN reaction that required hospitalization: no Has patient had a PCN reaction occurring within the last 10 years: yes If all of the above answers are "NO", then may proceed with Cephalosporin use.  Has tolerated ceftriaxone  . Penicillin G Anaphylaxis    Has tolerated ceftriaxone on multiple occasions   . Fluconazole Rash    REACTION: hives  . Difuleron [Ferrous Fumarate-Dss]   . 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 Sig Documenting Provider Last Dose Status Informant  acetaminophen (TYLENOL) 325 MG tablet [id number] Yes Take 2 tablets (650 mg total) by mouth every 6 (six) hours as needed for mild pain (or Fever >/= 101). Domenic Polite, MD Taking Active   albuterol (VENTOLIN HFA) 108 [phone] Base) MCG/ACT inhaler [id number] Yes INHALE TWO PUFFS BY MOUTH EVERY SIX HOURS AS NEEDED Tower, [name], MD Taking Active Multiple Informants  ALPRAZolam (XANAX) 0.5 MG tablet [id number] Yes TAKE ONE TABLET BY MOUTH twice daily AS NEEDED FOR ANXIETY OR SLEEP Thayer Headings, PMHNP Taking Active Multiple Informants  amitriptyline (ELAVIL) 50 MG tablet [id number]  Yes Take 1 tablet (50 mg total) by mouth at bedtime. Cammie Sickle, MD Taking Active Multiple Informants  amLODipine (NORVASC) 5 MG tablet [id number] Yes Take 1 tablet (5 mg total) by mouth daily. Tower, [name], MD Taking Active Multiple Informants  atorvastatin (LIPITOR) 10 MG tablet [id number] Yes TAKE 1 TABLET(10 MG) BY MOUTH DAILY Tower, [name], MD Taking Active   b complex vitamins tablet [id number] Yes Take 1 tablet by mouth daily. [provider] Taking Active Multiple Informants  busPIRone (BUSPAR) 15 MG tablet [id number]  Take 1 tablet (15 mg total) by mouth 2 (two) times daily. Thayer Headings, [place]  Expired 03/08/20 2359 Multiple Informants  Cholecalciferol (VITAMIN D-1000 MAX ST) 25 MCG (1000 UT) tablet [id number] Yes Take 2,000 Units by mouth daily. Takes three 50 mcg tablets, 3 capsules daily [provider] Taking Active Multiple Informants  ferrous sulfate 325 (65 FE) MG tablet [id number] Yes Take 1 tablet (325 mg total) by mouth 2 (two) times daily with a meal. Domenic Polite, MD Taking Active   glipiZIDE (GLUCOTROL) 5 MG tablet [id number] Yes Take 5 mg by mouth 2 (two) times daily. [provider] Taking Active  Multiple Informants  glucose blood test strip [id number] Yes To check blood glucose once daily and prn for diabetes Tower, [name], MD Taking Active Multiple Informants  JANUMET XR 50-1000 MG TB24 [id number] Yes Take 1 tablet by mouth 2 (two) times daily.  [provider] Taking Active Multiple Informants           Med Note Arvella Nigh, [place] A   Wed Sep 18, 2015 10:18 AM)    lidocaine-prilocaine (EMLA) cream [id number] Yes Apply 1 application topically as needed. Apply to port when going through Chemo [provider] Taking Active Multiple Informants  Multiple Vitamins-Minerals (WOMENS MULTIVITAMIN PO) [id number] Yes Take by mouth. [provider] Taking Active Multiple Informants  omeprazole (PRILOSEC) 20 MG capsule [id number] Yes TAKE ONE CAPSULE BY MOUTH EVERYDAY AT BEDTIME Tower, [name], MD Taking Active   pregabalin (LYRICA) 75 MG capsule [id number]  Take 1 capsule (75 mg total) by mouth daily. Shelly Coss, MD  Expired 04/17/20 2359 Multiple Informants  rOPINIRole (REQUIP) 1 MG tablet [id number] Yes TAKE 1/2 TABLET BY MOUTH EVERY MORNING and TAKE THREE TABLETS BY MOUTH EVERYDAY AT BEDTIME Tower, [name], MD Taking Active Multiple Informants  sertraline (ZOLOFT) 100 MG tablet [id number] Yes TAKE TWO TABLETS BY MOUTH EVERY MORNING Thayer Headings, PMHNP Taking Active Multiple Informants  sodium chloride flush (NS) 0.9 % injection 10 mL [id number]   Cammie Sickle, MD  Active   Suvorexant (BELSOMRA) 20 MG TABS [id number] Yes Take 1 tablet by mouth at bedtime. Thayer Headings, PMHNP Taking Active Multiple Informants  traZODone (DESYREL) 50 MG tablet [id number] Yes Take 1/2-2 tabs po QHS prn insomnia Thayer Headings, PMHNP Taking Active Multiple Informants          Patient Active Problem List   Diagnosis Date Noted  . Dysphagia   . Gastric erythema   . Gastric polyp   . Encounter for screening colonoscopy   . Polyp of colon   . Aspiration pneumonia ([place]) 02/15/2020   . Diabetes mellitus without complication ([place])   . GERD (gastroesophageal reflux disease)   . Hypertension   . Elevated troponin   . Urinary urgency 01/05/2020  . Grief reaction 09/18/2019  . Swallowing dysfunction 07/18/2019  . Fever, intermittent 09/01/2018  . Pre-syncope 02/07/2018  . Orthostatic hypotension 01/26/2018  . Episodic weakness  01/26/2018  . Medicare annual wellness visit, subsequent 06/29/2017  . 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 Preoperative examination 04/27/2017  . Carpal tunnel syndrome of right wrist 11/13/2016  . 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 Obstructive sleep apnea 01/01/2016  . Carcinoma of upper-outer quadrant of left breast in female, estrogen receptor negative ([place]) 11/21/2015  . Hypomagnesemia 08/28/2015  . Initial Medicare annual wellness visit 10/16/2014  . Estrogen deficiency 10/16/2014  . Colon cancer screening 10/16/2014  . Controlled type 2 diabetes mellitus without complication ([place]) 32/41/9914  . RLS (restless legs syndrome) 02/21/2014  . Chronic neck pain 07/25/2010  . Depression with anxiety 07/07/2010  . Routine general medical examination at a health care facility 06/26/2010  . B12 deficiency 09/04/2009  . Anemia, iron deficiency 08/29/2009  . Anxiety state 08/26/2009  . GASTROPARESIS 08/26/2009  . Vitamin D deficiency 07/19/2008  . Disorder of bone and cartilage 07/19/2007  . Hyperlipidemia 07/18/2007  . EDEMA 07/18/2007  . Skin lesion, superficial 06/02/2007    Immunization History  Administered Date(s) Administered  . PFIZER(Purple Top)SARS-COV-2 Vaccination 03/30/2019, 04/20/2019, 11/11/2019  . Pneumococcal Conjugate-13 10/16/2014  . Pneumococcal Polysaccharide-23 08/31/2012  . Td 06/16/2001  . Tdap 06/01/2012    Conditions to be addressed/monitored:  {USCCMDZASSESSMENTOPTIONS:23563}  There are no care plans that you recently modified to display for this patient.    Medication  Assistance: {MEDASSISTANCEINFO:25044}  Patient's preferred pharmacy is:  [facility] - [place], [place] - [address] [place] [address] Phone: [phone] Fax: [phone]  [place] [address] [place] [address] Phone: [phone] Fax: [phone]  Uses pill box? {Yes or If no, why not?:20788} Pt endorses ***% compliance  We discussed: {Pharmacy options:24294} Patient decided to: {US Pharmacy Plan:23885}  Care Plan and Follow Up Patient Decision:  {FOLLOWUP:24991}  Plan: {CM FOLLOW UP PLAN:25073}  ***

## 2020-07-03 ENCOUNTER — Other Ambulatory Visit: Payer: Self-pay | Admitting: Psychiatry

## 2020-07-03 DIAGNOSIS — F411 Generalized anxiety disorder: Secondary | ICD-10-CM

## 2020-07-05 ENCOUNTER — Other Ambulatory Visit: Payer: Self-pay

## 2020-07-05 DIAGNOSIS — F411 Generalized anxiety disorder: Secondary | ICD-10-CM

## 2020-07-05 MED ORDER — ALPRAZOLAM 0.5 MG PO TABS
ORAL_TABLET | ORAL | 1 refills | Status: DC
Start: 2020-07-05 — End: 2020-07-15

## 2020-07-05 NOTE — Telephone Encounter (Signed)
Xanax last filled 06/13/20.appt due in June

## 2020-07-08 ENCOUNTER — Ambulatory Visit: Payer: PPO | Admitting: Gastroenterology

## 2020-07-08 ENCOUNTER — Other Ambulatory Visit: Payer: Self-pay

## 2020-07-08 VITALS — BP 115/60 | HR 79 | Wt 220.0 lb

## 2020-07-08 DIAGNOSIS — R1312 Dysphagia, oropharyngeal phase: Secondary | ICD-10-CM | POA: Diagnosis not present

## 2020-07-08 NOTE — Progress Notes (Signed)
Suzanne Antigua, MD [address]  [place]  [place], [place] [address]  Main: [phone]  Fax: [phone]   Primary Care Physician: Tower, [name], MD   Chief complaint: Dysphagia  HPI: Suzanne Strong is a 75 y.o. female here for follow-up of dysphagia.  Patient underwent upper endoscopy that did not show any obstructive lesions and biopsies were negative for EOE.  Speech and swallow pathology evaluation does show oropharyngeal dysphagia.  Patient reports symptoms are better when she follows their recommendations.  No episodes of food impaction.  Colonoscopy showed poor prep and repeat colonoscopy is recommended in 6 months with 2-day prep and low fiber diet for 5 days prior to the procedure.  Current Outpatient Medications  Medication Sig Dispense Refill  . acetaminophen (TYLENOL) 325 MG tablet Take 2 tablets (650 mg total) by mouth every 6 (six) hours as needed for mild pain (or Fever >/= 101).    [name] albuterol (VENTOLIN HFA) 108 (90 Base) MCG/ACT inhaler INHALE TWO PUFFS BY MOUTH EVERY SIX HOURS AS NEEDED 8.5 g 3  . ALPRAZolam (XANAX) 0.5 MG tablet TAKE ONE TABLET BY MOUTH twice daily AS NEEDED FOR ANXIETY OR SLEEP 60 tablet 1  . amitriptyline (ELAVIL) 50 MG tablet Take 1 tablet (50 mg total) by mouth at bedtime. 90 tablet 3  . amLODipine (NORVASC) 5 MG tablet Take 1 tablet (5 mg total) by mouth daily. 90 tablet 3  . atorvastatin (LIPITOR) 10 MG tablet TAKE 1 TABLET(10 MG) BY MOUTH DAILY 90 tablet 3  . b complex vitamins tablet Take 1 tablet by mouth daily.    . Cholecalciferol (VITAMIN D-1000 MAX ST) 25 MCG (1000 UT) tablet Take 2,000 Units by mouth daily. Takes three 50 mcg tablets, 3 capsules daily    . ferrous sulfate 325 (65 FE) MG tablet Take 1 tablet (325 mg total) by mouth 2 (two) times daily with a meal.    . glipiZIDE (GLUCOTROL) 5 MG tablet Take 5 mg by mouth 2 (two) times daily.    [name] glucose blood test strip To check blood glucose once daily and prn for  diabetes 100 each 3  . JANUMET XR 50-1000 MG TB24 Take 1 tablet by mouth 2 (two) times daily.   0  . lidocaine-prilocaine (EMLA) cream Apply 1 application topically as needed. Apply to port when going through Chemo    . Multiple Vitamins-Minerals (WOMENS MULTIVITAMIN PO) Take by mouth.    [name] omeprazole (PRILOSEC) 20 MG capsule TAKE ONE CAPSULE BY MOUTH EVERYDAY AT BEDTIME 90 capsule 3  . rOPINIRole (REQUIP) 1 MG tablet TAKE 1/2 TABLET BY MOUTH EVERY MORNING and TAKE THREE TABLETS BY MOUTH EVERYDAY AT BEDTIME 315 tablet 5  . sertraline (ZOLOFT) 100 MG tablet TAKE TWO TABLETS BY MOUTH EVERY MORNING 60 tablet 1  . Suvorexant (BELSOMRA) 20 MG TABS Take 1 tablet by mouth at bedtime. 30 tablet 2  . traZODone (DESYREL) 50 MG tablet Take 1/2-2 tabs po QHS prn insomnia 60 tablet 1  . busPIRone (BUSPAR) 15 MG tablet Take 1 tablet (15 mg total) by mouth 2 (two) times daily. 60 tablet 5  . pregabalin (LYRICA) 75 MG capsule Take 1 capsule (75 mg total) by mouth daily. 30 capsule 1   No current facility-administered medications for this visit.   Facility-Administered Medications Ordered in Other Visits  Medication Dose Route Frequency Provider Last Rate Last Admin  . sodium chloride flush (NS) 0.9 % injection 10 mL  10 mL Intravenous PRN  Cammie Sickle, MD   10 mL at 09/11/15 0845    Allergies as of 07/08/2020 - Review Complete 07/08/2020  Allergen Reaction Noted  . Amoxicillin Swelling 07/18/2007  . Penicillin g Anaphylaxis 04/08/2020  . Fluconazole Rash   . Difuleron [ferrous fumarate-dss]  12/08/2017  . Other Other (See Comments) 05/10/2017    ROS:  General: Negative for anorexia, weight loss, fever, chills, fatigue, weakness. ENT: Negative for hoarseness, difficulty swallowing , nasal congestion. CV: Negative for chest pain, angina, palpitations, dyspnea on exertion, peripheral edema.  Respiratory: Negative for dyspnea at rest, dyspnea on exertion, cough, sputum, wheezing.  GI: See  history of present illness. GU:  Negative for dysuria, hematuria, urinary incontinence, urinary frequency, nocturnal urination.  Endo: Negative for unusual weight change.    Physical Examination:   BP 115/60   Pulse 79   Wt 220 lb (99.8 kg)   BMI 35.51 kg/m   General: Well-nourished, well-developed in no acute distress.  Eyes: No icterus. Conjunctivae pink. Mouth: Oropharyngeal mucosa moist and pink , no lesions erythema or exudate. Neck: Supple, Trachea midline Abdomen: Bowel sounds are normal, nontender, nondistended, no hepatosplenomegaly or masses, no abdominal bruits or hernia , no rebound or guarding.   Extremities: No lower extremity edema. No clubbing or deformities. Neuro: Alert and oriented x 3.  Grossly intact. Skin: Warm and dry, no jaundice.   Psych: Alert and cooperative, normal mood and affect.   Labs: CMP     Component Value Date/Time   NA 139 06/18/2020 1104   NA 142 03/01/2014 0846   K 4.5 06/18/2020 1104   K 4.3 03/01/2014 0846   CL 102 06/18/2020 1104   CL 102 03/01/2014 0846   CO2 28 06/18/2020 1104   CO2 28 03/01/2014 0846   GLUCOSE 176 (H) 06/18/2020 1104   GLUCOSE 219 (H) 03/01/2014 0846   BUN 14 06/18/2020 1104   BUN 12 03/01/2014 0846   CREATININE 0.81 06/18/2020 1104   CREATININE 0.95 03/01/2014 0846   CALCIUM 8.5 06/18/2020 1104   CALCIUM 8.6 03/01/2014 0846   PROT 6.8 06/18/2020 1104   PROT 7.5 03/01/2014 0846   ALBUMIN 3.6 06/18/2020 1104   ALBUMIN 3.4 03/01/2014 0846   AST 16 06/18/2020 1104   AST 20 03/01/2014 0846   ALT 13 06/18/2020 1104   ALT 28 03/01/2014 0846   ALKPHOS 91 06/18/2020 1104   ALKPHOS 122 (H) 03/01/2014 0846   BILITOT 0.2 06/18/2020 1104   BILITOT 0.2 03/01/2014 0846   GFRNONAA >60 05/31/2020 0456   GFRNONAA >60 03/01/2014 0846   GFRNONAA >60 08/24/2013 1015   GFRAA >60 10/20/2019 0950   GFRAA >60 03/01/2014 0846   GFRAA >60 08/24/2013 1015   Lab Results  Component Value Date   WBC 3.8 (L) 06/18/2020    HGB 10.6 (L) 06/18/2020   HCT 33.3 (L) 06/18/2020   MCV 86.5 06/18/2020   PLT 186.0 06/18/2020    Imaging Studies: DG Chest 2 View  Result Date: 06/19/2020 CLINICAL DATA:  74 year old female with history of pneumonia. EXAM: CHEST - 2 VIEW COMPARISON:  Chest x-ray 05/28/2020. FINDINGS: Diffuse peribronchial cuffing. Lung volumes are normal. No consolidative airspace disease. No pleural effusions. No pneumothorax. No pulmonary nodule or mass noted. Pulmonary vasculature and the cardiomediastinal silhouette are within normal limits. Atherosclerosis in the thoracic aorta. IMPRESSION: 1. Diffuse peribronchial cuffing, concerning for an acute bronchitis. 2. Aortic atherosclerosis. Electronically Signed   By: Vinnie Langton M.D.   On: 06/19/2020 20:02  Assessment and Plan:   Suzanne Strong is a 75 y.o. y/o female here for follow-up of dysphagia  No obstructive lesions or strictures from previous radiation were seen on her upper endoscopy Patient does have oropharyngeal dysphagia and speech and swallow pathology evaluation  She is not interested in any further work-up for this, and referral to ENT versus motility study was discussed with her but she declined these at this time  Continue follow-up with speech and swallow pathology  Repeat colonoscopy in 6 months with 2-day prep  Patient is having any new symptoms prior to 29-month, or any other questions, patient was advised to call us back     Dr Suzanne Strong

## 2020-07-09 ENCOUNTER — Telehealth: Payer: Self-pay

## 2020-07-09 NOTE — Chronic Care Management (AMB) (Addendum)
Chronic Care Management Pharmacy Assistant   Name: Suzanne Strong  MRN: [id number] DOB: 25-Jun-1945  Reason for Encounter: Medication Adherence and Delivery Coordination   Recent office visits:  06/18/20- Dr. Glori Bickers- PCP- Ordered CBC, CMP, Iron, TSH, Vitamin D, Chest X-ray. B12 shot at 5/3 appointment and then every 2 mo in addition to oral supplementation. Continue BID iron and re check cbc in 4-6 weeks 06/11/20- Dr. Glori Bickers- PCP - Completed course of Cefdinir. Recheck chest xray and CBC in 2 weeks.   Recent consult visits:  None since last CCM contact  Hospital visits:  05/28/20 Hospital Admission for Aspiration Pneumonia.  Medications: Outpatient Encounter Medications as of 07/09/2020  Medication Sig   acetaminophen (TYLENOL) 325 MG tablet Take 2 tablets (650 mg total) by mouth every 6 (six) hours as needed for mild pain (or Fever >/= 101).   albuterol (VENTOLIN HFA) 108 (90 Base) MCG/ACT inhaler INHALE TWO PUFFS BY MOUTH EVERY SIX HOURS AS NEEDED   ALPRAZolam (XANAX) 0.5 MG tablet TAKE ONE TABLET BY MOUTH twice daily AS NEEDED FOR ANXIETY OR SLEEP   amitriptyline (ELAVIL) 50 MG tablet Take 1 tablet (50 mg total) by mouth at bedtime.   amLODipine (NORVASC) 5 MG tablet Take 1 tablet (5 mg total) by mouth daily.   atorvastatin (LIPITOR) 10 MG tablet TAKE 1 TABLET(10 MG) BY MOUTH DAILY   b complex vitamins tablet Take 1 tablet by mouth daily.   busPIRone (BUSPAR) 15 MG tablet Take 1 tablet (15 mg total) by mouth 2 (two) times daily.   Cholecalciferol (VITAMIN D-1000 MAX ST) 25 MCG (1000 UT) tablet Take 2,000 Units by mouth daily. Takes three 50 mcg tablets, 3 capsules daily   ferrous sulfate 325 (65 FE) MG tablet Take 1 tablet (325 mg total) by mouth 2 (two) times daily with a meal.   glipiZIDE (GLUCOTROL) 5 MG tablet Take 5 mg by mouth 2 (two) times daily.   glucose blood test strip To check blood glucose once daily and prn for diabetes   JANUMET XR 50-1000 MG TB24 Take 1 tablet by  mouth 2 (two) times daily.    lidocaine-prilocaine (EMLA) cream Apply 1 application topically as needed. Apply to port when going through Chemo   Multiple Vitamins-Minerals (WOMENS MULTIVITAMIN PO) Take by mouth.   omeprazole (PRILOSEC) 20 MG capsule TAKE ONE CAPSULE BY MOUTH EVERYDAY AT BEDTIME   pregabalin (LYRICA) 75 MG capsule Take 1 capsule (75 mg total) by mouth daily.   rOPINIRole (REQUIP) 1 MG tablet TAKE 1/2 TABLET BY MOUTH EVERY MORNING and TAKE THREE TABLETS BY MOUTH EVERYDAY AT BEDTIME   sertraline (ZOLOFT) 100 MG tablet TAKE TWO TABLETS BY MOUTH EVERY MORNING   Suvorexant (BELSOMRA) 20 MG TABS Take 1 tablet by mouth at bedtime.   traZODone (DESYREL) 50 MG tablet Take 1/2-2 tabs po QHS prn insomnia   Facility-Administered Encounter Medications as of 07/09/2020  Medication   sodium chloride flush (NS) 0.9 % injection 10 mL    BP Readings from Last 3 Encounters:  07/08/20 115/60  06/18/20 124/60  06/11/20 136/78    Lab Results  Component Value Date   HGBA1C 6.7 (H) 05/28/2020     Attempted contact with Suzanne Strong 3 times on 07/09/20, 07/10/20, 07/11/20. Unsuccessful outreach. Will base medication delivery off of last month's medications.   Recent OV, Consult or Hospital visit:  Recent medication changes indicated:  06/18/20- Dr. Glori Bickers- PCP- Ordered CBC, CMP, Iron, TSH, Vitamin D, Chest X-ray. B12 shot  at 5/3 appointment and then every 2 mo in addition to oral supplementation. Continue BID iron and re check cbc in 4-6 weeks 06/11/20- Dr. Glori Bickers- PCP - Completed course of Cefdinir. Recheck chest xray and CBC in 2 weeks.   Last adherence delivery date: 06/17/20   Patient is due for next adherence delivery on: 07/18/20  This delivery to include: Adherence Packaging  30 Days  Packs: Sertraline 100mg - 2 tablets daily (2-breakfast)  Ropinirole 1mg  -  tablet (breakfast) and 3 tablets once nightly (bedtime)  Atorvastatin 10mg - 1 tablet daily (breakfast)  Amlodipine 5mg - 1 tablet  daily (breakfast)   Omeprazole 20mg - 1 tablet daily (bedtime)  Glipizide 5mg - 1 tablet twice daily (1-breakfast and 1-evening meal)   Janumet 50-100mg - 1 tablet two times a day (1-breakfast and 1-evening meal)   Pregabalin 150mg - 1 twice daily (breakfast and bedtime)   Buspirone 15mg - 1 tablet twice daily (breakfast and bedtime)  Trazodone 50mg - 1 tablet daily (bedtime)  Amitriptyline 50mg - 1 tablet daily (bedtime)  Alpha lipoic acid 200mg - 1 tablet daily (bedtime)   PRN/VIAL medications: Alprazolam 0.5mg -Take 1 tablet twice daily PRN   Patient declined the following medications this month: Belsomra 20 mg tabs-1 tab daily at bedtime (bedtime) COST CONCERN - Unable to speak with patient to discuss if she wanted this included. Albuterol inhaler -Inhale 2 puffs every 6 hours as needed- PRN - Unable to speak with patient to discuss if she wanted this included. OTC Centrum Silver Multivitamin - 1 tablet daily (breakfast)- Pt buys OTC OneTouch ultra test strips - use to check blood sugar once daily - Unable to speak with patient to discuss if she wanted this included. OTC Vitamin B-Complex - 1 tablet daily ( breakfast)- Pt buys OTC OTC Vitamin D3 50 mcg - 1 tablet twice daily (breakfast and bedtime) Pt buys OTC  No refill request needed from PCP.  Patient will need refill from Suzanne Strong on her Alprazolam.   Delivery date of 07/18/20.  Recent blood pressure readings are as follows: N/A unable to reach patient  Recent blood glucose readings are as follows: N/A unable to reach patient  Follow-Up:  Pharmacist Review  Suzanne Strong, CPP notified  Suzanne Strong, [place] Assistant [phone]  I have reviewed the care management and care coordination activities outlined in this encounter and I am certifying that I agree with the content of this note. No further action required.  Suzanne Strong, PharmD Clinical Pharmacist [facility] at [facility] [phone]

## 2020-07-12 ENCOUNTER — Other Ambulatory Visit: Payer: Self-pay | Admitting: Psychiatry

## 2020-07-12 DIAGNOSIS — F411 Generalized anxiety disorder: Secondary | ICD-10-CM

## 2020-07-12 NOTE — Telephone Encounter (Signed)
Last filled 06/13/20

## 2020-07-15 ENCOUNTER — Telehealth: Payer: Self-pay | Admitting: Psychiatry

## 2020-07-15 DIAGNOSIS — F411 Generalized anxiety disorder: Secondary | ICD-10-CM

## 2020-07-15 MED ORDER — ALPRAZOLAM 0.5 MG PO TABS
ORAL_TABLET | ORAL | 1 refills | Status: DC
Start: 2020-07-15 — End: 2020-07-18

## 2020-07-16 ENCOUNTER — Telehealth: Payer: Self-pay | Admitting: *Deleted

## 2020-07-16 MED ORDER — CEFDINIR 300 MG PO CAPS: 300.0000 mg | ORAL_CAPSULE | Freq: Two times a day (BID) | ORAL | 0 refills | Status: DC

## 2020-07-16 NOTE — Telephone Encounter (Signed)
I pended [facility]ir [facility] She has pcn allergy - but had this in April w/o problems (please confirm with her before sending) She has a h/o recurrent aspiration pneumonia and tends to get sick very quickly  Start medication  If worse tonight-go to ER (do not delay!) and have someone check in with her frequently if possible  I cannot r/o covid-do a home test if she has one also  Please put her in my 12:30 slot tomorrow for virtual visit  If at any point today or tonight worse- go to UC or ER and we will cancel that appt  I don't want her to wait if condition worsens

## 2020-07-16 NOTE — Telephone Encounter (Signed)
Patient advised. RX sent to [facility], per patient she did well with this medication before. Patient took [place] home test yesterday and it was negative. Appointment made for tomorrow as requested.

## 2020-07-16 NOTE — Telephone Encounter (Signed)
Patient called stating that she has had pneumonia several times in the past. Patient stated last night her fever got up to 102.0 and she was having chills. Patient stated that she does have a cough. Patient stated that her temperature is normal today. Patient stated that she does feel better today. Patient denies a fever today, SOB, body aches or chills. Patient wants to know with her history if she should be put on an antibiotic to prevent pneumonia because of her history. Patient stated that she just wanted Dr. Glori Bickers to be aware of her symptoms last night. Patient was given ER precautions and she verbalized understanding. [facility]

## 2020-07-17 ENCOUNTER — Other Ambulatory Visit: Payer: Self-pay

## 2020-07-17 ENCOUNTER — Encounter: Payer: Self-pay | Admitting: Family Medicine

## 2020-07-17 ENCOUNTER — Other Ambulatory Visit (INDEPENDENT_AMBULATORY_CARE_PROVIDER_SITE_OTHER): Payer: PPO

## 2020-07-17 ENCOUNTER — Telehealth (INDEPENDENT_AMBULATORY_CARE_PROVIDER_SITE_OTHER): Payer: PPO | Admitting: Family Medicine

## 2020-07-17 ENCOUNTER — Ambulatory Visit
Admission: RE | Admit: 2020-07-17 | Discharge: 2020-07-17 | Disposition: A | Discharge status: Home / Self Care | Payer: PPO | Source: Ambulatory Visit | Attending: Family Medicine | Admitting: Family Medicine

## 2020-07-17 ENCOUNTER — Other Ambulatory Visit: Payer: Self-pay | Admitting: Family Medicine

## 2020-07-17 ENCOUNTER — Other Ambulatory Visit
Admission: RE | Admit: 2020-07-17 | Discharge: 2020-07-17 | Disposition: A | Discharge status: Home / Self Care | Payer: PPO | Source: Ambulatory Visit | Attending: Family Medicine | Admitting: Family Medicine

## 2020-07-17 ENCOUNTER — Other Ambulatory Visit: Payer: Self-pay | Admitting: Psychiatry

## 2020-07-17 DIAGNOSIS — I10 Essential (primary) hypertension: Secondary | ICD-10-CM

## 2020-07-17 DIAGNOSIS — R059 Cough, unspecified: Secondary | ICD-10-CM | POA: Diagnosis not present

## 2020-07-17 DIAGNOSIS — R609 Edema, unspecified: Secondary | ICD-10-CM

## 2020-07-17 DIAGNOSIS — R509 Fever, unspecified: Secondary | ICD-10-CM

## 2020-07-17 DIAGNOSIS — Z8701 Personal history of pneumonia (recurrent): Secondary | ICD-10-CM

## 2020-07-17 DIAGNOSIS — R8290 Unspecified abnormal findings in urine: Secondary | ICD-10-CM

## 2020-07-17 DIAGNOSIS — R300 Dysuria: Secondary | ICD-10-CM

## 2020-07-17 DIAGNOSIS — J189 Pneumonia, unspecified organism: Secondary | ICD-10-CM | POA: Diagnosis not present

## 2020-07-17 DIAGNOSIS — F411 Generalized anxiety disorder: Secondary | ICD-10-CM

## 2020-07-17 LAB — POC URINALSYSI DIPSTICK (AUTOMATED)
Bilirubin, UA: 1
Blood, UA: NEGATIVE
Glucose, UA: NEGATIVE
Ketones, UA: 5
Nitrite, UA: NEGATIVE
Protein, UA: POSITIVE — AB
Spec Grav, UA: >=1.03 — AB (ref 1.010–1.025)
Urobilinogen, UA: 0.2 E.U./dL
pH, UA: 6 (ref 5.0–8.0)

## 2020-07-17 LAB — POC INFLUENZA A&B (BINAX/QUICKVUE)
Influenza A, POC: NEGATIVE
Influenza B, POC: NEGATIVE

## 2020-07-17 NOTE — Assessment & Plan Note (Signed)
Several episodes that escalate quickly  tx with cefdinir for this episode of cough/fever  Watch for sob  ER parameters reviewed cxr ordered at [facility]  covid and flu tests ordered  Fortunately she is feeling better

## 2020-07-17 NOTE — Assessment & Plan Note (Signed)
With cough in setting of recurrent asp pna  Cefdinir px cxr ordered  Covid/flu tests ordered  ER parameters discussed

## 2020-07-17 NOTE — Addendum Note (Signed)
Addended by: Tammi Sou on: 07/17/2020 03:45 PM   Modules accepted: Orders

## 2020-07-17 NOTE — Assessment & Plan Note (Signed)
ua ordered for drop off Pending result  Enc good fluid intake

## 2020-07-17 NOTE — Progress Notes (Signed)
Virtual Visit via Video Note  I connected with Suzanne Strong on 07/17/20 at 12:30 PM EDT by a video enabled telemedicine application and verified that I am speaking with the correct person using two identifiers.  Location: Patient: home Provider: office   I discussed the limitations of evaluation and management by telemedicine and the availability of in person appointments. The patient expressed understanding and agreed to proceed.  Parties involved in encounter  Patient: Suzanne Strong  Provider:  [name] MD    Video was completed by phone due to video problems    History of Present Illness: Pt presents with fever and cough (in setting of h/o asp pneumonia)   Feels a bit better today  Has not started the abx yet-going to pick it up  Pt called yesterday  Had fever of 102 on 5/30 with chills and achy  By end of the day yesterday -came back to normal  Down with tylenol  Some cough  These are symptoms she had in the past with pna  Did home covid test and it was nl (today)  Some cough  Sounds junky but nothing comes up No nasal symptoms  Had a headache and ears bothered her  That is better now   She is not urinating that much (despite drinking a lot)  Stool was loose on 5/30 - better now  No nausea or vomiting   Some urgency to urinate-not much volume occ burns a bit to urinate  occ low pelvic pain /crampy (not now)    Lower legs swell at end of day (not new)    Yesterday we sent in cefdiir -going to pick up now  Pt has hx of recurrent aspiration pna and gets sick very quickly   No otc meds   Is covid immunized   Last cxr 5/4 DG Chest 2 View  Result Date: 06/19/2020 CLINICAL DATA:  75 year old female with history of pneumonia. EXAM: CHEST - 2 VIEW COMPARISON:  Chest x-ray 05/28/2020. FINDINGS: Diffuse peribronchial cuffing. Lung volumes are normal. No consolidative airspace disease. No pleural effusions. No pneumothorax. No pulmonary nodule or mass  noted. Pulmonary vasculature and the cardiomediastinal silhouette are within normal limits. Atherosclerosis in the thoracic aorta. IMPRESSION: 1. Diffuse peribronchial cuffing, concerning for an acute bronchitis. 2. Aortic atherosclerosis. Electronically Signed   By: Vinnie Langton M.D.   On: 06/19/2020 20:02   Patient Active Problem List   Diagnosis Date Noted  . Dysuria 07/17/2020  . Dysphagia   . Gastric erythema   . Gastric polyp   . Encounter for screening colonoscopy   . Polyp of colon   . History of aspiration pneumonia 02/15/2020  . Diabetes mellitus without complication ([place])   . GERD (gastroesophageal reflux disease)   . Hypertension   . Urinary urgency 01/05/2020  . Grief reaction 09/18/2019  . Swallowing dysfunction 07/18/2019  . Cough 02/21/2019  . Fever, intermittent 09/01/2018  . Pre-syncope 02/07/2018  . Orthostatic hypotension 01/26/2018  . Episodic weakness 01/26/2018  . Medicare annual wellness visit, subsequent 06/29/2017  . 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 Preoperative examination 04/27/2017  . Carpal tunnel syndrome of right wrist 11/13/2016  . 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 Obstructive sleep apnea 01/01/2016  . Carcinoma of upper-outer quadrant of left breast in female, estrogen receptor negative ([place]) 11/21/2015  . Hypomagnesemia 08/28/2015  . Fever 04/11/2015  . Initial Medicare annual wellness visit 10/16/2014  . Estrogen deficiency 10/16/2014  . Colon cancer  screening 10/16/2014  . Controlled type 2 diabetes mellitus without complication ([place]) 38/75/6433  . RLS (restless legs syndrome) 02/21/2014  . Chronic neck pain 07/25/2010  . Depression with anxiety 07/07/2010  . Routine general medical examination at a health care facility 06/26/2010  . B12 deficiency 09/04/2009  . Anemia, iron deficiency 08/29/2009  . Anxiety state 08/26/2009  . GASTROPARESIS 08/26/2009  . Vitamin D deficiency 07/19/2008  . Disorder  of bone and cartilage 07/19/2007  . Hyperlipidemia 07/18/2007  . EDEMA 07/18/2007  . Skin lesion, superficial 06/02/2007   Past Medical History:  Diagnosis Date  . Anemia    iron deficiency and B12 deficiency  . Anxiety   . Breast cancer ([place]) 02/25/2015   upper-outer quadrant of left female breast, Triple Negative. Neo-adjuvant chemo with complete pathologic response.   . Cervical dysplasia    conization  . Diabetes mellitus without complication ([place])   . Diverticulosis   . Fever 04/11/2015  . Gastroparesis    related to previous radiation tx  . GERD (gastroesophageal reflux disease)   . Hyperlipidemia   . Hypertension   . Neuropathy    legs/feet, S/P chemo meds  . Personal history of chemotherapy   . Personal history of radiation therapy 2017   LEFT lumpectomy w/ radiation  . RLS (restless legs syndrome)   . Shingles    Hx of  . 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 Wears dentures    partial bottom   Past Surgical History:  Procedure Laterality Date  . APPENDECTOMY    . BREAST BIOPSY Left 02/25/2015   INVASIVE MAMMARY CARCINOMA,Triple negative.  [name] BREAST CYST ASPIRATION    . BREAST EXCISIONAL BIOPSY Left 12/2015   surgery  . BREAST LUMPECTOMY WITH NEEDLE LOCALIZATION Left 10/02/2015   Procedure: BREAST LUMPECTOMY WITH NEEDLE LOCALIZATION;  Surgeon: [name], MD;  Location: [facility];  Service: General;  Laterality: Left;  . BREAST LUMPECTOMY WITH SENTINEL LYMPH NODE BIOPSY Left 10/02/2015   Procedure: LEFT BREAST WIDE EXCISION WITH SENTINEL LYMPH NODE BX;  Surgeon: [name], MD;  Location: [facility];  Service: General;  Laterality: Left;  . BREAST SURGERY     breast biopsy benign  . CARPAL TUNNEL RELEASE Right 05/19/2017   Procedure: CARPAL TUNNEL RELEASE;  Surgeon: Earnestine Leys, MD;  Location: [facility];  Service: Orthopedics;  Laterality: Right;  . CATARACT EXTRACTION W/PHACO Right 10/26/2018   Procedure: CATARACT EXTRACTION PHACO AND INTRAOCULAR LENS  PLACEMENT ([place]) right diabetic;  Surgeon: Leandrew Koyanagi, MD;  Location: [place] [place];  Service: Ophthalmology;  Laterality: Right;  Diabetic - oral meds cancer center been notified and will come  . CATARACT EXTRACTION W/PHACO Left 11/16/2018   Procedure: CATARACT EXTRACTION PHACO AND INTRAOCULAR LENS PLACEMENT ([facility]) LEFT DIABETIC  01:01.0  17.0%  10.37;  Surgeon: Leandrew Koyanagi, MD;  Location: [place];  Service: Ophthalmology;  Laterality: Left;  Diabetic - oral meds Port-a-cath  . CHOLECYSTECTOMY    . Cologuard  07/22/2016   Negative  . COLONOSCOPY WITH PROPOFOL N/A 05/24/2020   Procedure: COLONOSCOPY WITH PROPOFOL;  Surgeon: Virgel Manifold, MD;  Location: [facility];  Service: Endoscopy;  Laterality: N/A;  . ESOPHAGOGASTRODUODENOSCOPY  07/2009   normal with few gastric polyps  . ESOPHAGOGASTRODUODENOSCOPY (EGD) WITH PROPOFOL N/A 05/24/2020   Procedure: ESOPHAGOGASTRODUODENOSCOPY (EGD) WITH PROPOFOL;  Surgeon: Virgel Manifold, MD;  Location: [facility];  Service: Endoscopy;  Laterality: N/A;  . MOLE REMOVAL    . PORT-A-CATH REMOVAL    .  PORTACATH PLACEMENT    . PORTACATH PLACEMENT Right 03/12/2015   Procedure: INSERTION PORT-A-CATH;  Surgeon: [name], MD;  Location: [facility];  Service: General;  Laterality: Right;  . RADICAL NECK DISSECTION    . TONSILLECTOMY     cancer treated with chemo and radiation  . TRIGGER FINGER RELEASE Right 05/19/2017   Procedure: RELEASE TRIGGER FINGER/A-1 PULLEY ring finger;  Surgeon: Earnestine Leys, MD;  Location: [facility];  Service: Orthopedics;  Laterality: Right;   Social History   Tobacco Use  . Smoking status: Former Smoker    Packs/day: 0.50    Years: 6.00    Pack years: 3.00    Types: Cigarettes    Quit date: 03/10/1984    Years since quitting: 36.3  . Smokeless tobacco: Never Used  Vaping Use  . Vaping Use: Never used  Substance Use Topics  . Alcohol use: No    Alcohol/week: 0.0 standard  drinks  . Drug use: No   Family History  Problem Relation Age of Onset  . Osteoporosis Mother   . Hyperlipidemia Mother   . Depression Mother   . Cancer Mother        skin cancer ? basal cell and lung ca heavy smoker  . Alcohol abuse Father   . Cancer Father        skin CA ? basal cell  . Heart disease Father        CHF  . Hyperlipidemia Brother   . Hypertension Brother   . Breast cancer Neg Hx    Allergies  Allergen Reactions  . Amoxicillin Swelling    REACTION: rash, swelling Has patient had a PCN reaction causing immediate rash, facial/tongue/throat swelling, SOB or lightheadedness with hypotension: yes Has patient had a PCN reaction causing severe rash involving mucus membranes or skin necrosis: no Has patient had a PCN reaction that required hospitalization: no Has patient had a PCN reaction occurring within the last 10 years: yes If all of the above answers are "NO", then may proceed with Cephalosporin use.  Has tolerated ceftriaxone  . Penicillin G Anaphylaxis    Has tolerated ceftriaxone on multiple occasions   . Fluconazole Rash    REACTION: hives  . Difuleron [Ferrous Fumarate-Dss]   . 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 acetaminophen (TYLENOL) 325 MG tablet Take 2 tablets (650 mg total) by mouth every 6 (six) hours as needed for mild pain (or Fever >/= 101).    [name] albuterol (VENTOLIN HFA) 108 (90 Base) MCG/ACT inhaler INHALE TWO PUFFS BY MOUTH EVERY SIX HOURS AS NEEDED 8.5 g 3  . ALPRAZolam (XANAX) 0.5 MG tablet TAKE ONE TABLET BY MOUTH twice daily AS NEEDED FOR ANXIETY OR SLEEP 60 tablet 1  . amitriptyline (ELAVIL) 50 MG tablet Take 1 tablet (50 mg total) by mouth at bedtime. 90 tablet 3  . amLODipine (NORVASC) 5 MG tablet Take 1 tablet (5 mg total) by mouth daily. 90 tablet 3  . atorvastatin (LIPITOR)  10 MG tablet TAKE 1 TABLET(10 MG) BY MOUTH DAILY 90 tablet 3  . b complex vitamins tablet Take 1 tablet by mouth daily.    . cefdinir (OMNICEF) 300 MG capsule Take 1 capsule (300 mg total) by mouth 2 (two) times daily. 20 capsule 0  . Cholecalciferol (VITAMIN D-1000 MAX ST) 25  MCG (1000 UT) tablet Take 2,000 Units by mouth daily. Takes three 50 mcg tablets, 3 capsules daily    . ferrous sulfate 325 (65 FE) MG tablet Take 1 tablet (325 mg total) by mouth 2 (two) times daily with a meal.    . glipiZIDE (GLUCOTROL) 5 MG tablet Take 5 mg by mouth 2 (two) times daily.    [name] glucose blood test strip To check blood glucose once daily and prn for diabetes 100 each 3  . JANUMET XR 50-1000 MG TB24 Take 1 tablet by mouth 2 (two) times daily.   0  . lidocaine-prilocaine (EMLA) cream Apply 1 application topically as needed. Apply to port when going through Chemo    . Multiple Vitamins-Minerals (WOMENS MULTIVITAMIN PO) Take by mouth.    [name] omeprazole (PRILOSEC) 20 MG capsule TAKE ONE CAPSULE BY MOUTH EVERYDAY AT BEDTIME 90 capsule 3  . rOPINIRole (REQUIP) 1 MG tablet TAKE 1/2 TABLET BY MOUTH EVERY MORNING and TAKE THREE TABLETS BY MOUTH EVERYDAY AT BEDTIME 315 tablet 5  . sertraline (ZOLOFT) 100 MG tablet TAKE TWO TABLETS BY MOUTH EVERY MORNING 60 tablet 1  . Suvorexant (BELSOMRA) 20 MG TABS Take 1 tablet by mouth at bedtime. 30 tablet 2  . traZODone (DESYREL) 50 MG tablet Take 1/2-2 tabs po QHS prn insomnia 60 tablet 1  . busPIRone (BUSPAR) 15 MG tablet Take 1 tablet (15 mg total) by mouth 2 (two) times daily. 60 tablet 5  . pregabalin (LYRICA) 75 MG capsule Take 1 capsule (75 mg total) by mouth daily. 30 capsule 1   Current Facility-Administered Medications on File Prior to Visit  Medication Dose Route Frequency Provider Last Rate Last Admin  . sodium chloride flush (NS) 0.9 % injection 10 mL  10 mL Intravenous PRN Cammie Sickle, MD   10 mL at 09/11/15 0845   Review of Systems  Constitutional:  Positive for fever and malaise/fatigue. Negative for chills.  HENT: Negative for congestion, ear pain, sinus pain and sore throat.   Eyes: Negative for blurred vision, discharge and redness.  Respiratory: Negative for cough, sputum production, shortness of breath and stridor.   Cardiovascular: Negative for chest pain, palpitations and leg swelling.  Gastrointestinal: Negative for abdominal pain, diarrhea, nausea and vomiting.  Musculoskeletal: Negative for myalgias.  Skin: Negative for rash.  Neurological: Positive for headaches. Negative for dizziness.      Observations/Objective: Pt sounds hoarse (but this is her baseline) No audible cough but does clear her throat Not sob and no wheezing heard  Nl cognition- good historian Nl mood   Assessment and Plan: Problem List Items Addressed This Visit      Cardiovascular and Mediastinum   Hypertension    Amlodipine may be causing pedal edema Pt will try holding 2 d and update  May have to consider change to ace/arb or adding one        Other   EDEMA    Possibly due to amlodipine Will hold 2 d and call with response      Fever - Primary    With cough in setting of recurrent asp pna  Cefdinir px cxr ordered  Covid/flu tests ordered  ER parameters discussed      History of aspiration pneumonia    Several episodes that escalate quickly  tx with cefdinir for this episode of cough/fever  Watch for sob  ER parameters reviewed cxr ordered at [facility]  covid and flu tests ordered  Fortunately she is feeling better  Dysuria    ua ordered for drop off Pending result  Enc good fluid intake          Follow Up Instructions:   Drink fluids and rest Tylenol for fever -keep tracking it  If suddenly short of breath or worse- go to the ER   The office will call you to set up covid/flu testing and a urinalysis and chest xray  Take the cefdinir as prescribed I discussed the assessment and treatment plan with the patient.  The patient was provided an opportunity to ask questions and all were answered. The patient agreed with the plan and demonstrated an understanding of the instructions.   The patient was advised to call back or seek an in-person evaluation if the symptoms worsen or if the condition fails to improve as anticipated.  I provided 18 minutes of non-face-to-face time during this encounter.   [name], MD

## 2020-07-17 NOTE — Assessment & Plan Note (Signed)
Amlodipine may be causing pedal edema Pt will try holding 2 d and update  May have to consider change to ace/arb or adding one

## 2020-07-17 NOTE — Telephone Encounter (Signed)
Tried to reach pharmacy but already closed. I did go ahead and call it in and instructed them to call me with any questions. I do not think it went through.    I can try to figure out what's wrong if you like.

## 2020-07-17 NOTE — Assessment & Plan Note (Signed)
Possibly due to amlodipine Will hold 2 d and call with response

## 2020-07-17 NOTE — Telephone Encounter (Signed)
Please contact pharmacy to confirm they received prescription since it keeps indicating that it was printed after the order is signed without asking for multi-factor authentication like usual with it being sent electronically.

## 2020-07-17 NOTE — Patient Instructions (Signed)
Drink fluids and rest Tylenol for fever -keep tracking it  If suddenly short of breath or worse- go to the ER   The office will call you to set up covid/flu testing and a urinalysis and chest xray  Take the cefdinir as prescribed

## 2020-07-18 LAB — SARS-COV-2, NAA 2 DAY TAT

## 2020-07-18 LAB — NOVEL CORONAVIRUS, NAA: SARS-CoV-2, NAA: NOT DETECTED

## 2020-07-18 NOTE — Telephone Encounter (Signed)
Please try to resend. If prints out, I will fax to pharmacy.

## 2020-07-18 NOTE — Telephone Encounter (Signed)
[facility] sent refill request for ALPRAZOLAM and now calling for refill approval. Please contact pharmacy @ [phone]

## 2020-07-19 ENCOUNTER — Telehealth: Payer: Self-pay

## 2020-07-19 LAB — URINE CULTURE
MICRO NUMBER:: 11956058
SPECIMEN QUALITY:: ADEQUATE

## 2020-07-19 NOTE — Telephone Encounter (Signed)
Yes, infectious pneumonia is contagious  I would re schedule what she has planned  Good question

## 2020-07-19 NOTE — Telephone Encounter (Signed)
Spoke to pt and relayed Dr. Alba Cory note. Pt will cancel her plans.

## 2020-07-19 NOTE — Telephone Encounter (Signed)
Rx resubmitted

## 2020-07-19 NOTE — Telephone Encounter (Signed)
Pt left VM in reference to her recent chest XR. Pt states that she was told she had pneumonia but a negative covid test. Pt would like to know if she is contagious with pneumonia because she has an obligation tomorrow and needs to let someone know if she can't be there, ASAP. Sending this to pt's PCP and Dr. Darnell Level as PCP is out of office.

## 2020-07-26 ENCOUNTER — Other Ambulatory Visit (INDEPENDENT_AMBULATORY_CARE_PROVIDER_SITE_OTHER): Payer: PPO

## 2020-07-26 ENCOUNTER — Other Ambulatory Visit: Payer: Self-pay

## 2020-07-26 DIAGNOSIS — D509 Iron deficiency anemia, unspecified: Secondary | ICD-10-CM | POA: Diagnosis not present

## 2020-07-26 DIAGNOSIS — I10 Essential (primary) hypertension: Secondary | ICD-10-CM

## 2020-07-26 LAB — BASIC METABOLIC PANEL
BUN: 15 mg/dL (ref 6–23)
CO2: 29 mEq/L (ref 19–32)
Calcium: 8.8 mg/dL (ref 8.4–10.5)
Chloride: 103 mEq/L (ref 96–112)
Creatinine, Ser: 0.89 mg/dL (ref 0.40–1.20)
GFR: 64.67 mL/min (ref >60.00)
Glucose, Bld: 92 mg/dL (ref 70–99)
Potassium: 4.1 mEq/L (ref 3.5–5.1)
Sodium: 141 mEq/L (ref 135–145)

## 2020-07-26 LAB — CBC WITH DIFFERENTIAL/PLATELET
Basophils Absolute: 0 10*3/uL (ref 0.0–0.1)
Basophils Relative: 0.6 % (ref 0.0–3.0)
Eosinophils Absolute: 0.2 10*3/uL (ref 0.0–0.7)
Eosinophils Relative: 3.7 % (ref 0.0–5.0)
HCT: 34.5 % — ABNORMAL LOW (ref 36.0–46.0)
Hemoglobin: 11.4 g/dL — ABNORMAL LOW (ref 12.0–15.0)
Lymphocytes Relative: 30.7 % (ref 12.0–46.0)
Lymphs Abs: 1.5 10*3/uL (ref 0.7–4.0)
MCHC: 32.9 g/dL (ref 30.0–36.0)
MCV: 86 fl (ref 78.0–100.0)
Monocytes Absolute: 0.3 10*3/uL (ref 0.1–1.0)
Monocytes Relative: 6.1 % (ref 3.0–12.0)
Neutro Abs: 2.9 10*3/uL (ref 1.4–7.7)
Neutrophils Relative %: 58.9 % (ref 43.0–77.0)
Platelets: 214 10*3/uL (ref 150.0–400.0)
RBC: 4.01 Mil/uL (ref 3.87–5.11)
RDW: 18.1 % — ABNORMAL HIGH (ref 11.5–15.5)
WBC: 4.9 10*3/uL (ref 4.0–10.5)

## 2020-07-29 ENCOUNTER — Telehealth: Payer: Self-pay | Admitting: *Deleted

## 2020-07-29 NOTE — Telephone Encounter (Signed)
-----   Message from [name], MD sent at 07/28/2020 12:38 PM EDT ----- Cbc is further improved  Take iron dosing down from bid to daily  Re check in 6-8 weeks -cbc/iron  F/u with heme/onc as planned

## 2020-07-29 NOTE — Telephone Encounter (Signed)
Called pt and no answer (kept ringing), no VM

## 2020-08-01 NOTE — Telephone Encounter (Signed)
Left VM requesting pt to call the office back 

## 2020-08-01 NOTE — Telephone Encounter (Signed)
Patient advised and follow up lab scheduled.

## 2020-08-02 ENCOUNTER — Ambulatory Visit: Payer: PPO | Admitting: Psychiatry

## 2020-08-05 ENCOUNTER — Other Ambulatory Visit: Payer: Self-pay | Admitting: Psychiatry

## 2020-08-05 DIAGNOSIS — F411 Generalized anxiety disorder: Secondary | ICD-10-CM

## 2020-08-05 DIAGNOSIS — E119 Type 2 diabetes mellitus without complications: Secondary | ICD-10-CM | POA: Diagnosis not present

## 2020-08-05 DIAGNOSIS — G4700 Insomnia, unspecified: Secondary | ICD-10-CM

## 2020-08-07 ENCOUNTER — Telehealth: Payer: Self-pay

## 2020-08-07 NOTE — Chronic Care Management (AMB) (Addendum)
Chronic Care Management Pharmacy Assistant   Name: Suzanne Strong  MRN: [id number] DOB: 18-Apr-1945  Reason for Encounter: Medication Adherence and Delivery Coordination  Recent office visits:  07/28/20- PCP Telephone encounter- Decrease Iron from BID to 1 daily.  07/17/20- PCP- Fever- Ordered urinalysis. Held amlodipine x 2 days to see if it is causing pedal edema.  07/16/20- PCP telephone encounter- Cefdinir 300 mg x10 days started due to fever, chills and cough.   Recent consult visits:  None since last CCM contact  Hospital visits:  05/28/20- Admitted to [facility] for 3 days due to aspiration pneumonia. 02/15/20- Admitted to [facility] for 2 days from aspiration pneumonia. 02/13/21- ED due to cough.    Medications: Outpatient Encounter Medications as of 08/07/2020  Medication Sig   acetaminophen (TYLENOL) 325 MG tablet Take 2 tablets (650 mg total) by mouth every 6 (six) hours as needed for mild pain (or Fever >/= 101).   albuterol (VENTOLIN HFA) 108 (90 Base) MCG/ACT inhaler INHALE TWO PUFFS BY MOUTH EVERY SIX HOURS AS NEEDED   ALPRAZolam (XANAX) 0.5 MG tablet TAKE ONE TABLET BY MOUTH TWICE DAILY AS NEEDED FOR ANXIETY OR SLEEP   amitriptyline (ELAVIL) 50 MG tablet Take 1 tablet (50 mg total) by mouth at bedtime.   amLODipine (NORVASC) 5 MG tablet Take 1 tablet (5 mg total) by mouth daily.   atorvastatin (LIPITOR) 10 MG tablet TAKE 1 TABLET(10 MG) BY MOUTH DAILY   b complex vitamins tablet Take 1 tablet by mouth daily.   busPIRone (BUSPAR) 15 MG tablet TAKE ONE TABLET BY MOUTH EVERY MORNING and TAKE ONE TABLET BY MOUTH EVERYDAY AT BEDTIME   cefdinir (OMNICEF) 300 MG capsule Take 1 capsule (300 mg total) by mouth 2 (two) times daily.   Cholecalciferol (VITAMIN D-1000 MAX ST) 25 MCG (1000 UT) tablet Take 2,000 Units by mouth daily. Takes three 50 mcg tablets, 3 capsules daily   ferrous sulfate 325 (65 FE) MG tablet Take 1 tablet (325 mg total) by mouth 2 (two) times daily with a meal.    glipiZIDE (GLUCOTROL) 5 MG tablet Take 5 mg by mouth 2 (two) times daily.   glucose blood test strip To check blood glucose once daily and prn for diabetes   JANUMET XR 50-1000 MG TB24 Take 1 tablet by mouth 2 (two) times daily.    lidocaine-prilocaine (EMLA) cream Apply 1 application topically as needed. Apply to port when going through Chemo   Multiple Vitamins-Minerals (WOMENS MULTIVITAMIN PO) Take by mouth.   omeprazole (PRILOSEC) 20 MG capsule TAKE ONE CAPSULE BY MOUTH EVERYDAY AT BEDTIME   pregabalin (LYRICA) 75 MG capsule Take 1 capsule (75 mg total) by mouth daily.   rOPINIRole (REQUIP) 1 MG tablet TAKE 1/2 TABLET BY MOUTH EVERY MORNING and TAKE THREE TABLETS BY MOUTH EVERYDAY AT BEDTIME   sertraline (ZOLOFT) 100 MG tablet TAKE TWO TABLETS BY MOUTH EVERY MORNING   Suvorexant (BELSOMRA) 20 MG TABS Take 1 tablet by mouth at bedtime.   traZODone (DESYREL) 50 MG tablet TAKE ONE TABLET BY MOUTH EVERYDAY AT BEDTIME   Facility-Administered Encounter Medications as of 08/07/2020  Medication   sodium chloride flush (NS) 0.9 % injection 10 mL    BP Readings from Last 3 Encounters:  07/08/20 115/60  06/18/20 124/60  06/11/20 136/78    Lab Results  Component Value Date   HGBA1C 6.7 (H) 05/28/2020      Last adherence delivery date: 07/18/20    Patient is due for next adherence  delivery on: 08/16/20  Spoke with patient on 08/08/20 and reviewed medications and coordinated delivery.  This delivery to include: Adherence Packaging  30 Days  Packs: Ropinirole 1mg  -  tablet (breakfast) and 3 tablets once nightly (bedtime)  Atorvastatin 10mg - 1 tablet daily (breakfast)  Amlodipine 5mg - 1 tablet daily (breakfast)   Omeprazole 20mg - 1 tablet daily (bedtime)  Amitriptyline 50mg - 1 tablet daily (bedtime)  Sertraline 100mg - 2 tablets daily (2-breakfast)  Pregabalin 150mg - 1 twice daily (*wants both at bedtime) Trazodone 50mg - 1 tablet daily (bedtime)  Alpha lipoic acid 200mg - 1 tablet daily  (bedtime)  Buspirone 15mg - 1 tablet twice daily (breakfast and bedtime)   PRN/VIAL medications: Alprazolam 0.5mg -Take 1 tablet twice daily PRN  Glipizide 5mg - 1 tablet twice daily (1-breakfast and 1-evening meal)     Patient declined the following medications this month: Belsomra 20 mg tabs-1 tab daily at bedtime (bedtime) - states she has some samples due to cost Albuterol inhaler -Inhale 2 puffs every 6 hours as needed- PRN  OTC Centrum Silver Multivitamin - 1 tablet daily (breakfast)- Pt buys OTC OneTouch ultra test strips - use to check blood sugar once daily- has enough on hand x 1 month OTC Vitamin B-Complex - 1 tablet daily ( breakfast)- Pt buys OTC OTC Vitamin D3 50 mcg - 1 tablet twice daily (breakfast and bedtime) Pt buys OTC Janumet 50-100mg - 1 tablet two times a day (1-breakfast and 1-evening meal) - has about 30 days remaining due to change in medication and past hospitalization  Any concerns about your medications? No  How often do you forget or accidentally miss a dose? Never  Do you use a pillbox? Yes  Is patient in packaging Yes  If yes  What is the date on your next pill pack?   Any concerns or issues with your packaging? No concerns- requests to take both pregabalin tablets at bedtime    No refill request needed from PCP.  Confirmed delivery date of 08/16/20, advised patient that pharmacy will contact her the morning of delivery.  Recent blood pressure readings are as follows: 130/85, 74 154/70, 80 133/85, 79 158/82, 76  Recent blood glucose readings are as follows:  Fasting: 80, 60, 105, 53 - states when BG gets below 70 she drinks orange juice and rechecks 10-15 minutes later BG is usually over 100 at that time  [name], CPP notified  Margaretmary Dys, [place] [phone]  I have reviewed the care management and care coordination activities outlined in this encounter and I am certifying that I agree with the content  of this note. Given her hypoglycemia, I would suggest we hold the glipizide until endo visit next week. Will take out her packaging.   [name], PharmD Clinical Pharmacist [facility] at [facility] [phone]

## 2020-08-12 DIAGNOSIS — E11649 Type 2 diabetes mellitus with hypoglycemia without coma: Secondary | ICD-10-CM | POA: Diagnosis not present

## 2020-08-12 NOTE — Chronic Care Management (AMB) (Signed)
Contacted patient to verify remaining Janumet. Unable to reach patient. Left message asking for patient to return call.    [name], CPP notified  Margaretmary Dys, [place] Pharmacy Assistant [phone]

## 2020-09-03 ENCOUNTER — Other Ambulatory Visit: Payer: Self-pay | Admitting: Psychiatry

## 2020-09-03 DIAGNOSIS — F411 Generalized anxiety disorder: Secondary | ICD-10-CM

## 2020-09-03 NOTE — Telephone Encounter (Signed)
Last refill for alprazolam was 6/29 I'm guessing [facility] fills them early to be mailed

## 2020-09-04 ENCOUNTER — Telehealth: Payer: Self-pay

## 2020-09-04 NOTE — Chronic Care Management (AMB) (Addendum)
Chronic Care Management Pharmacy Assistant   Name: Suzanne Strong  MRN: [id number] DOB: 04-04-1945  Reason for Encounter: Medication Adherence and Delivery Coordination   Recent office visits:  None since the last CCM contact  Recent consult visits:  08/12/20 - Endocrinology - Stop glipizide, continue Janumet.  Hospital visits:  None in previous 6 months  Medications: Outpatient Encounter Medications as of 09/04/2020  Medication Sig   acetaminophen (TYLENOL) 325 MG tablet Take 2 tablets (650 mg total) by mouth every 6 (six) hours as needed for mild pain (or Fever >/= 101).   albuterol (VENTOLIN HFA) 108 (90 Base) MCG/ACT inhaler INHALE TWO PUFFS BY MOUTH EVERY SIX HOURS AS NEEDED   ALPRAZolam (XANAX) 0.5 MG tablet TAKE ONE TABLET BY MOUTH twice daily AS NEEDED FOR ANXIETY OR SLEEP   amitriptyline (ELAVIL) 50 MG tablet Take 1 tablet (50 mg total) by mouth at bedtime.   amLODipine (NORVASC) 5 MG tablet Take 1 tablet (5 mg total) by mouth daily.   atorvastatin (LIPITOR) 10 MG tablet TAKE 1 TABLET(10 MG) BY MOUTH DAILY   b complex vitamins tablet Take 1 tablet by mouth daily.   busPIRone (BUSPAR) 15 MG tablet TAKE ONE TABLET BY MOUTH EVERY MORNING and TAKE ONE TABLET BY MOUTH EVERYDAY AT BEDTIME   cefdinir (OMNICEF) 300 MG capsule Take 1 capsule (300 mg total) by mouth 2 (two) times daily.   Cholecalciferol (VITAMIN D-1000 MAX ST) 25 MCG (1000 UT) tablet Take 2,000 Units by mouth daily. Takes three 50 mcg tablets, 3 capsules daily   ferrous sulfate 325 (65 FE) MG tablet Take 1 tablet (325 mg total) by mouth 2 (two) times daily with a meal.   glipiZIDE (GLUCOTROL) 5 MG tablet Take 5 mg by mouth 2 (two) times daily.   glucose blood test strip To check blood glucose once daily and prn for diabetes   JANUMET XR 50-1000 MG TB24 Take 1 tablet by mouth 2 (two) times daily.    lidocaine-prilocaine (EMLA) cream Apply 1 application topically as needed. Apply to port when going through Chemo    Multiple Vitamins-Minerals (WOMENS MULTIVITAMIN PO) Take by mouth.   omeprazole (PRILOSEC) 20 MG capsule TAKE ONE CAPSULE BY MOUTH EVERYDAY AT BEDTIME   pregabalin (LYRICA) 75 MG capsule Take 1 capsule (75 mg total) by mouth daily.   rOPINIRole (REQUIP) 1 MG tablet TAKE 1/2 TABLET BY MOUTH EVERY MORNING and TAKE THREE TABLETS BY MOUTH EVERYDAY AT BEDTIME   sertraline (ZOLOFT) 100 MG tablet TAKE TWO TABLETS BY MOUTH EVERY MORNING   Suvorexant (BELSOMRA) 20 MG TABS Take 1 tablet by mouth at bedtime.   traZODone (DESYREL) 50 MG tablet TAKE ONE TABLET BY MOUTH EVERYDAY AT BEDTIME   Facility-Administered Encounter Medications as of 09/04/2020  Medication   sodium chloride flush (NS) 0.9 % injection 10 mL   BP Readings from Last 3 Encounters:  07/08/20 115/60  06/18/20 124/60  06/11/20 136/78    Lab Results  Component Value Date   HGBA1C 6.7 (H) 05/28/2020    Left message 7/20 7/26   Recent OV, Consult or Hospital visit:  Recent medication changes indicated: 08/12/20 - Endocrinology -Due to occasional hypoglycemia, we elected to stop glipizide.  Patient obtains medications through Adherence Packaging  30 Days   Last adherence delivery date: 08/16/2020      Patient is due for next adherence delivery on: 09/17/2020  Multiple attempts made to reach patient on 09/04/20,09/10/20. Unsuccessful outreach. Will refill based off of last adherence  fill.   This delivery to include: Adherence Packaging  30 Days  Packs: Ropinirole 1mg  -  tablet (breakfast) and 3 tablets nightly (bedtime)  Atorvastatin 10mg - 1 tablet daily (breakfast)  Amlodipine 5mg - 1 tablet daily (breakfast)   Omeprazole 20mg - 1 tablet daily (bedtime)  Amitriptyline 50mg - 1 tablet daily (bedtime)  Sertraline 100mg - 2 tablets daily (2-breakfast)  Pregabalin 150mg - 2 daily (2 bedtime) Trazodone 50mg - 1 tablet daily (bedtime)  OTC Alpha lipoic acid 200mg - 1 tablet daily (bedtime)  Buspirone 15mg - 1 tablet twice daily (breakfast  and bedtime) Janumet XR 50-1000mg  take 2 tablets daily (breakfast and evening meal)  PRN/VIAL medications: Alprazolam 0.5mg -Take 1 tablet twice daily PRN   Patient declined the following medications this month: Belsomra 20 mg tabs-1 tab daily at bedtime (bedtime) - using samples due to cost Albuterol inhaler -Inhale 2 puffs every 6 hours as needed- PRN  OTC Centrum Silver Multivitamin - 1 tablet daily (breakfast)- Pt buys OTC OneTouch ultra test strips - use to check blood sugar once daily - unable to reach patient to ask OTC Vitamin B-Complex - 1 tablet daily ( breakfast)- Pt buys OTC OTC Vitamin D3 50 mcg - 1 tablet twice daily (breakfast and bedtime) Pt buys OTC Glipizide 5mg  08/12/20  Endocrinology stopped  Refills requested from PCP include: None  Follow-Up:  Coordination of Enhanced Pharmacy Services and Pharmacist Review  [name], CPP notified  Avel Sensor, [place] Assistant [phone]  I have reviewed the care management and care coordination activities outlined in this encounter and I am certifying that I agree with the content of this note. No further action required.  [name], PharmD Clinical Pharmacist [facility] at [facility] [phone]

## 2020-09-11 NOTE — Progress Notes (Signed)
The patient called back with some changes in the refill process that she does not need Janumet at this time due to having 1 month supply on hand and she gave me the following blood pressure readings  Readings are in the morning before medications  09/08/20 -148/67  78P 09/09/20 -154/76  86P 09/10/20 -150/72  81P 09/11/20 -133/85  79P  [name], CPP notified  Avel Sensor, [place] Assistant [phone]  Total time spent for month CPA: 10 min

## 2020-09-17 ENCOUNTER — Telehealth: Payer: Self-pay | Admitting: Family Medicine

## 2020-09-17 DIAGNOSIS — D509 Iron deficiency anemia, unspecified: Secondary | ICD-10-CM

## 2020-09-17 NOTE — Telephone Encounter (Signed)
-----   Message from Cloyd Stagers, RT sent at 09/02/2020  9:59 AM EDT ----- Regarding: Lab Orders for Wednesday 8.3.2022 Please place lab orders for Wednesday 8.3.2022, appt notes state "recheck CBC and iron" Thank you, Dyke Maes RT(R)

## 2020-09-18 ENCOUNTER — Other Ambulatory Visit: Payer: Self-pay

## 2020-09-18 ENCOUNTER — Other Ambulatory Visit (INDEPENDENT_AMBULATORY_CARE_PROVIDER_SITE_OTHER): Payer: PPO

## 2020-09-18 DIAGNOSIS — D509 Iron deficiency anemia, unspecified: Secondary | ICD-10-CM | POA: Diagnosis not present

## 2020-09-18 LAB — CBC WITH DIFFERENTIAL/PLATELET
Basophils Absolute: 0 10*3/uL (ref 0.0–0.1)
Basophils Relative: 0.6 % (ref 0.0–3.0)
Eosinophils Absolute: 0.1 10*3/uL (ref 0.0–0.7)
Eosinophils Relative: 1.9 % (ref 0.0–5.0)
HCT: 34.5 % — ABNORMAL LOW (ref 36.0–46.0)
Hemoglobin: 11.2 g/dL — ABNORMAL LOW (ref 12.0–15.0)
Lymphocytes Relative: 18.1 % (ref 12.0–46.0)
Lymphs Abs: 1.1 10*3/uL (ref 0.7–4.0)
MCHC: 32.6 g/dL (ref 30.0–36.0)
MCV: 88.8 fl (ref 78.0–100.0)
Monocytes Absolute: 0.3 10*3/uL (ref 0.1–1.0)
Monocytes Relative: 5.3 % (ref 3.0–12.0)
Neutro Abs: 4.5 10*3/uL (ref 1.4–7.7)
Neutrophils Relative %: 74.1 % (ref 43.0–77.0)
Platelets: 182 10*3/uL (ref 150.0–400.0)
RBC: 3.88 Mil/uL (ref 3.87–5.11)
RDW: 16.6 % — ABNORMAL HIGH (ref 11.5–15.5)
WBC: 6 10*3/uL (ref 4.0–10.5)

## 2020-09-18 LAB — IRON: Iron: 65 ug/dL (ref 42–145)

## 2020-09-18 LAB — FERRITIN: Ferritin: 43.6 ng/mL (ref 10.0–291.0)

## 2020-09-20 ENCOUNTER — Encounter: Payer: Self-pay | Admitting: *Deleted

## 2020-10-02 ENCOUNTER — Other Ambulatory Visit: Payer: Self-pay | Admitting: Psychiatry

## 2020-10-02 DIAGNOSIS — F411 Generalized anxiety disorder: Secondary | ICD-10-CM

## 2020-10-03 NOTE — Telephone Encounter (Signed)
Please schedule appt

## 2020-10-04 NOTE — Telephone Encounter (Signed)
Left message for pt to call and schedule

## 2020-10-07 ENCOUNTER — Telehealth: Payer: Self-pay

## 2020-10-07 NOTE — Chronic Care Management (AMB) (Addendum)
Chronic Care Management Pharmacy Assistant   Name: Suzanne Strong  MRN: [id number] DOB: 10-Sep-1945   Reason for Encounter: Medication Adherence and Delivery Coordination   Recent office visits:  09/18/20- PCP -CBC ordered labs normal continue oral iron  Recent consult visits:  None since  last CCM contact  Hospital visits:  None in previous 6 months  Medications: Outpatient Encounter Medications as of 10/07/2020  Medication Sig   acetaminophen (TYLENOL) 325 MG tablet Take 2 tablets (650 mg total) by mouth every 6 (six) hours as needed for mild pain (or Fever >/= 101).   albuterol (VENTOLIN HFA) 108 (90 Base) MCG/ACT inhaler INHALE TWO PUFFS BY MOUTH EVERY SIX HOURS AS NEEDED   ALPRAZolam (XANAX) 0.5 MG tablet TAKE ONE TABLET BY MOUTH twice daily AS NEEDED FOR ANXIETY OR SLEEP   amitriptyline (ELAVIL) 50 MG tablet Take 1 tablet (50 mg total) by mouth at bedtime.   amLODipine (NORVASC) 5 MG tablet Take 1 tablet (5 mg total) by mouth daily.   atorvastatin (LIPITOR) 10 MG tablet TAKE 1 TABLET(10 MG) BY MOUTH DAILY   b complex vitamins tablet Take 1 tablet by mouth daily.   busPIRone (BUSPAR) 15 MG tablet TAKE ONE TABLET BY MOUTH EVERY MORNING and TAKE ONE TABLET BY MOUTH EVERYDAY AT BEDTIME   cefdinir (OMNICEF) 300 MG capsule Take 1 capsule (300 mg total) by mouth 2 (two) times daily.   Cholecalciferol (VITAMIN D-1000 MAX ST) 25 MCG (1000 UT) tablet Take 2,000 Units by mouth daily. Takes three 50 mcg tablets, 3 capsules daily   ferrous sulfate 325 (65 FE) MG tablet Take 1 tablet (325 mg total) by mouth 2 (two) times daily with a meal.   glipiZIDE (GLUCOTROL) 5 MG tablet Take 5 mg by mouth 2 (two) times daily.   glucose blood test strip To check blood glucose once daily and prn for diabetes   JANUMET XR 50-1000 MG TB24 Take 1 tablet by mouth 2 (two) times daily.    lidocaine-prilocaine (EMLA) cream Apply 1 application topically as needed. Apply to port when going through Chemo    Multiple Vitamins-Minerals (WOMENS MULTIVITAMIN PO) Take by mouth.   omeprazole (PRILOSEC) 20 MG capsule TAKE ONE CAPSULE BY MOUTH EVERYDAY AT BEDTIME   pregabalin (LYRICA) 75 MG capsule Take 1 capsule (75 mg total) by mouth daily.   rOPINIRole (REQUIP) 1 MG tablet TAKE 1/2 TABLET BY MOUTH EVERY MORNING and TAKE THREE TABLETS BY MOUTH EVERYDAY AT BEDTIME   sertraline (ZOLOFT) 100 MG tablet TAKE TWO TABLETS BY MOUTH EVERY MORNING   Suvorexant (BELSOMRA) 20 MG TABS Take 1 tablet by mouth at bedtime.   traZODone (DESYREL) 50 MG tablet TAKE ONE TABLET BY MOUTH EVERYDAY AT BEDTIME   Facility-Administered Encounter Medications as of 10/07/2020  Medication   sodium chloride flush (NS) 0.9 % injection 10 mL    BP Readings from Last 3 Encounters:  07/08/20 115/60  06/18/20 124/60  06/11/20 136/78    Lab Results  Component Value Date   HGBA1C 6.7 (H) 05/28/2020      No OVs, Consults, or hospital visits since last care coordination call / Pharmacist visit. No medication changes indicated   Last adherence delivery date:09/17/20      Patient is due for next adherence delivery on: 10/16/20  Multiple attempts made to reach patient on 10/07/20,10/08/20,10/09/20,. Unsuccessful outreach. Will refill based off of last adherence fill.   This delivery to include: Adherence Packaging  30 Days  Packs: Ropinirole '1mg'$  -  tablet (breakfast) and 3 tablets nightly (bedtime)  Atorvastatin '10mg'$ - 1 tablet daily (breakfast)  Amlodipine '5mg'$ - 1 tablet daily (breakfast)   Omeprazole '20mg'$ - 1 tablet daily (bedtime)  Amitriptyline '50mg'$ - 1 tablet daily (bedtime)  Sertraline '100mg'$ - 2 tablets daily (2-breakfast)  Pregabalin '150mg'$ - 2 daily (2 bedtime) Trazodone '50mg'$ - 1 tablet daily (bedtime)  OTC Alpha lipoic acid '200mg'$ - 1 tablet daily (bedtime)  Buspirone '15mg'$ - 1 tablet twice daily (breakfast and bedtime)  VIAL medications: Alprazolam 0.'5mg'$ -Take 1 tablet twice daily PRN   Declined/not due: Janumet XR 50-'1000mg'$   take 2 tablets daily (breakfast and evening meal) (filled 10/03/20 30 DS) Albuterol inhaler (filled 10/03/20 30 DS)  Unable to verify if Xanax needed.  [name], CPP notified  Avel Sensor, [place] Assistant [phone]  I have reviewed the care management and care coordination activities outlined in this encounter and I am certifying that I agree with the content of this note. Buspirone refill request declined by MD due to appt needed. Attempted to contact patient to let her know, left VM.  [name], PharmD Clinical Pharmacist [place] [facility] at [facility] [phone]

## 2020-10-10 ENCOUNTER — Telehealth: Payer: Self-pay | Admitting: Psychiatry

## 2020-10-10 ENCOUNTER — Other Ambulatory Visit: Payer: Self-pay | Admitting: Family Medicine

## 2020-10-10 DIAGNOSIS — F411 Generalized anxiety disorder: Secondary | ICD-10-CM

## 2020-10-10 MED ORDER — BUSPIRONE HCL 15 MG PO TABS: 15.0000 mg | ORAL_TABLET | Freq: Two times a day (BID) | ORAL | 0 refills | Status: DC

## 2020-10-10 NOTE — Telephone Encounter (Signed)
Suzanne Strong called and is currently at [employer] in [place], [place]. She forgot to bring her Buspirone pills with her on vacation. She is waiting for [facility] to mail her refills. Could she please get 4 pills sent to [facility], [address] [place], [place]

## 2020-10-10 NOTE — Addendum Note (Signed)
Addended by: [name] on: 10/10/2020 10:01 AM   Modules accepted: Orders

## 2020-10-10 NOTE — Telephone Encounter (Signed)
Please review

## 2020-10-10 NOTE — Telephone Encounter (Signed)
Script sent  

## 2020-10-10 NOTE — Progress Notes (Signed)
Patient called stating that she went on vacation and forgot 4 days of her evening dose of medications. She would like these sent to [facility] in [place], [place]. [facility] agreed to send but informed us that patient will need new prescription of Pregabalin as they will not be able to send it back. Contacted Dr. Gabriel Carina to request she send in new prescription to [facility] and explain situation.    [name], CPP notified  Margaretmary Dys, [place] Pharmacy Assistant [phone]

## 2020-10-11 NOTE — Telephone Encounter (Signed)
Both meds filled on 06/18/20 and given a years worth of refills

## 2020-10-24 ENCOUNTER — Other Ambulatory Visit: Payer: Self-pay | Admitting: General Surgery

## 2020-10-24 DIAGNOSIS — R920 Mammographic microcalcification found on diagnostic imaging of breast: Secondary | ICD-10-CM

## 2020-10-30 ENCOUNTER — Ambulatory Visit
Admission: RE | Admit: 2020-10-30 | Discharge: 2020-10-30 | Disposition: A | Discharge status: Home / Self Care | Payer: PPO | Source: Ambulatory Visit | Attending: General Surgery | Admitting: General Surgery

## 2020-10-30 ENCOUNTER — Other Ambulatory Visit: Payer: Self-pay | Admitting: Psychiatry

## 2020-10-30 ENCOUNTER — Other Ambulatory Visit: Payer: Self-pay

## 2020-10-30 DIAGNOSIS — R920 Mammographic microcalcification found on diagnostic imaging of breast: Secondary | ICD-10-CM | POA: Insufficient documentation

## 2020-10-30 DIAGNOSIS — R922 Inconclusive mammogram: Secondary | ICD-10-CM | POA: Diagnosis not present

## 2020-10-30 DIAGNOSIS — F411 Generalized anxiety disorder: Secondary | ICD-10-CM

## 2020-11-01 NOTE — Telephone Encounter (Signed)
Both filled 8/25 but she has apt on 9/19 please send then

## 2020-11-04 ENCOUNTER — Other Ambulatory Visit: Payer: Self-pay

## 2020-11-04 ENCOUNTER — Encounter: Payer: Self-pay | Admitting: Psychiatry

## 2020-11-04 ENCOUNTER — Ambulatory Visit (INDEPENDENT_AMBULATORY_CARE_PROVIDER_SITE_OTHER): Payer: PPO | Admitting: Psychiatry

## 2020-11-04 DIAGNOSIS — F411 Generalized anxiety disorder: Secondary | ICD-10-CM

## 2020-11-04 DIAGNOSIS — F5101 Primary insomnia: Secondary | ICD-10-CM | POA: Diagnosis not present

## 2020-11-04 MED ORDER — BELSOMRA 20 MG PO TABS: 20.0000 mg | ORAL_TABLET | Freq: Every evening | ORAL | 0 refills | Status: DC | PRN

## 2020-11-04 MED ORDER — SERTRALINE HCL 100 MG PO TABS: 200.0000 mg | ORAL_TABLET | Freq: Every morning | ORAL | 1 refills | Status: DC

## 2020-11-04 MED ORDER — BUSPIRONE HCL 15 MG PO TABS: ORAL_TABLET | ORAL | 1 refills | Status: DC

## 2020-11-04 MED ORDER — TRAZODONE HCL 50 MG PO TABS: ORAL_TABLET | ORAL | 1 refills | Status: DC

## 2020-11-04 MED ORDER — ALPRAZOLAM 0.5 MG PO TABS: ORAL_TABLET | ORAL | 5 refills | Status: DC

## 2020-11-04 NOTE — Progress Notes (Signed)
Suzanne Strong [id number] [birth year] 75 y.o.  Subjective:   Patient ID:  Suzanne Strong is a 75 y.o. (DOB 29-Oct-1945) female.  Chief Complaint:  Chief Complaint  Patient presents with   Anxiety   Insomnia    HPI Suzanne Strong presents to the office today for follow-up of insomnia, anxiety, and depression. Suzanne Strong has had pneumonia twice, January and May. Suzanne Strong reports that Suzanne Strong may continue to be susceptible to recurrent pneumonia.   Suzanne Strong started taking Melatonin 10 mg po QHS. Suzanne Strong reports that it helps with sleep initiation and often does not sleep through the night. Sleeping about 6 hours a night.  Suzanne Strong reports that Belsomra was helpful for Suzanne Strong insomnia but was cost prohibitive. Denies nightmares. Suzanne Strong reports increased RLS. Suzanne Strong reports that Suzanne Strong has had chronic insomnia since high school or college. Suzanne Strong reports that Suzanne Strong mother told Suzanne Strong   Suzanne Strong reports that Suzanne Strong may be taking Buspar in the morning only.   Suzanne Strong denies persistent sad mood. Has been missing husband and the things that he did to help Suzanne Strong. Suzanne Strong reports that Suzanne Strong has been more tired since having pneumonias. Suzanne Strong reports adequate concentration. Denies SI.   Suzanne Strong reports occasional anxiety. Suzanne Strong denies worry upon awakening. Some worry about finances since husband's death.   Enjoys reading. Continues to work part-time and reports that work is supportive. Suzanne Strong works some on-site and some remotely.   Takes Xanax prn around lunch or the afternoon and occasionally one in the evening.   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 Reports that this pastor helped with Suzanne Strong husband getting back into church. Suzanne Strong reports that Suzanne Strong journals about things and writes as if Suzanne Strong were communicating to Suzanne Strong deceased husband.     Past Psychiatric Medication Trials: Remeron Xanax Gabapentin- Unsure if this is helpful for RLS Lyrica Ropinorole- Helpful for  RLS Zoloft Buspar Lyrica Trazodone-Ineffective Amitriptyline Belsomra    Mini-Mental    Flowsheet Row Clinical Support from 07/15/2018 in [place] at [place] from 06/29/2017 in [place] at [place] from 06/25/2016 in [place] at [facility]  Total Score (max 30 points ) 17 20 20       PHQ2-9    Flowsheet Row Clinical Support from 07/15/2018 in [place] at [place] from 06/29/2017 in [place] at [place] from 06/25/2016 in [place] at [place] Follow Up  from 02/24/2016 in [place] from 10/25/2015 in [place] [facility]  PHQ-2 Total Score 0 0 0 0 0  PHQ-9 Total Score 0 0 -- -- --      Flowsheet Row ED to Hosp-Admission (Discharged) from 05/28/2020 in [place] (1C) Admission (Discharged) from 05/24/2020 in [place] No Risk No Risk        Review of Systems:  Review of Systems  Constitutional:  Positive for fatigue.  Musculoskeletal:  Negative for gait problem.  Neurological:        Restless legs  Psychiatric/Behavioral:         Please refer to HPI   Medications: I have reviewed the patient's current medications.  Current Outpatient Medications  Medication Sig Dispense Refill   acetaminophen (TYLENOL) 325 MG tablet Take 2 tablets (650 mg total) by mouth every 6 (six) hours as needed  for mild pain (or Fever >/= 101).     albuterol (VENTOLIN HFA) 108 (90 Base) MCG/ACT inhaler INHALE TWO PUFFS BY MOUTH EVERY SIX HOURS AS NEEDED 8.5 g 3   amitriptyline (ELAVIL) 50 MG tablet Take 1 tablet (50 mg total) by mouth at bedtime. 90 tablet 3   amLODipine (NORVASC) 5 MG tablet Take 1 tablet (5 mg total) by mouth daily. 90 tablet 3   atorvastatin (LIPITOR) 10 MG tablet TAKE 1 TABLET(10  MG) BY MOUTH DAILY 90 tablet 3   b complex vitamins tablet Take 1 tablet by mouth daily.     Cholecalciferol (VITAMIN D-1000 MAX ST) 25 MCG (1000 UT) tablet Take 2,000 Units by mouth daily. Takes three 50 mcg tablets, 3 capsules daily     ferrous sulfate 325 (65 FE) MG tablet Take 1 tablet (325 mg total) by mouth 2 (two) times daily with a meal.     JANUMET XR 50-1000 MG TB24 Take 1 tablet by mouth 2 (two) times daily.   0   MELATONIN PO Take 10 mg by mouth.     Multiple Vitamins-Minerals (WOMENS MULTIVITAMIN PO) Take by mouth.     omeprazole (PRILOSEC) 20 MG capsule TAKE ONE CAPSULE BY MOUTH EVERYDAY AT BEDTIME 90 capsule 3   pregabalin (LYRICA) 150 MG capsule Take 300 mg by mouth at bedtime.     rOPINIRole (REQUIP) 1 MG tablet TAKE 1/2 TABLET BY MOUTH EVERY MORNING and TAKE THREE TABLETS BY MOUTH EVERYDAY AT BEDTIME 315 tablet 5   ALPRAZolam (XANAX) 0.5 MG tablet Take ONE tablet by MOUTH twice daily as needed FOR anxiety OR SLEEP 60 tablet 5   busPIRone (BUSPAR) 15 MG tablet Take 1 tablet in the morning and 1/2 tablet in the evening for one week, then increase to 1 tablet twice daily 180 tablet 1   cefdinir (OMNICEF) 300 MG capsule Take 1 capsule (300 mg total) by mouth 2 (two) times daily. (Patient not taking: Reported on 11/04/2020) 20 capsule 0   glucose blood test strip To check blood glucose once daily and prn for diabetes 100 each 3   lidocaine-prilocaine (EMLA) cream Apply 1 application topically as needed. Apply to port when going through Chemo     sertraline (ZOLOFT) 100 MG tablet Take 2 tablets (200 mg total) by mouth every morning. 180 tablet 1   traZODone (DESYREL) 50 MG tablet TAKE ONE TABLET BY MOUTH EVERYDAY AT BEDTIME 90 tablet 1   No current facility-administered medications for this visit.   Facility-Administered Medications Ordered in Other Visits  Medication Dose Route Frequency Provider Last Rate Last Admin   sodium chloride flush (NS) 0.9 % injection 10 mL  10 mL  Intravenous PRN Cammie Sickle, MD   10 mL at 09/11/15 0845    Medication Side Effects: None  Allergies:  Allergies  Allergen Reactions   Amoxicillin Swelling    REACTION: rash, swelling Has patient had a PCN reaction causing immediate rash, facial/tongue/throat swelling, SOB or lightheadedness with hypotension: yes Has patient had a PCN reaction causing severe rash involving mucus membranes or skin necrosis: no Has patient had a PCN reaction that required hospitalization: no Has patient had a PCN reaction occurring within the last 10 years: yes If all of the above answers are "NO", then may proceed with Cephalosporin use.  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 Neo-adjuvant chemo with complete pathologic response.    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   Please see review of systems for further details on the patient's review  from today.   Objective:   Physical Exam:  There were no vitals taken for this visit.  Physical Exam Constitutional:      General: Suzanne Strong is not in acute distress. Musculoskeletal:        General: No deformity.  Neurological:     Mental Status: Suzanne Strong is alert and oriented to person, place, and time.     Coordination: Coordination normal.  Psychiatric:        Attention and Perception: Attention and perception normal. Suzanne Strong does not perceive auditory or visual hallucinations.        Mood and Affect: Affect is not labile, blunt, angry or inappropriate.        Speech: Speech normal.        Behavior: Behavior normal.        Thought Content: Thought content normal. Thought content is not paranoid or delusional. Thought content does not include homicidal or suicidal ideation. Thought content does not include homicidal or suicidal plan.        Cognition and Memory: Cognition and memory normal.        Judgment: Judgment normal.     Comments: Insight intact Mood is appropriate and affect is congruent    Lab Review:     Component Value Date/Time   NA 141 07/26/2020 0806   NA 142 03/01/2014 0846   K 4.1 07/26/2020 0806   K 4.3 03/01/2014 0846   CL 103 07/26/2020 0806   CL 102 03/01/2014 0846   CO2 29 07/26/2020 0806   CO2 28 03/01/2014 0846   GLUCOSE 92 07/26/2020 0806   GLUCOSE 219 (H) 03/01/2014 0846   BUN 15 07/26/2020 0806   BUN 12 03/01/2014 0846   CREATININE 0.89 07/26/2020 0806   CREATININE 0.95 03/01/2014 0846   CALCIUM 8.8 07/26/2020 0806   CALCIUM 8.6 03/01/2014 0846   PROT 6.8 06/18/2020 1104   PROT 7.5 03/01/2014 0846   ALBUMIN 3.6 06/18/2020 1104   ALBUMIN 3.4 03/01/2014 0846   AST 16 06/18/2020 1104   AST 20 03/01/2014 0846   ALT 13 06/18/2020 1104   ALT 28 03/01/2014 0846   ALKPHOS 91 06/18/2020 1104   ALKPHOS 122 (H) 03/01/2014 0846   BILITOT 0.2 06/18/2020 1104  BILITOT 0.2 03/01/2014 0846   GFRNONAA >60 05/31/2020 0456   GFRNONAA >60 03/01/2014 0846    GFRNONAA >60 08/24/2013 1015   GFRAA >60 10/20/2019 0950   GFRAA >60 03/01/2014 0846   GFRAA >60 08/24/2013 1015       Component Value Date/Time   WBC 6.0 09/18/2020 1014   RBC 3.88 09/18/2020 1014   HGB 11.2 (L) 09/18/2020 1014   HGB 11.5 (L) 03/01/2014 0846   HCT 34.5 (L) 09/18/2020 1014   HCT 36.4 03/01/2014 0846   PLT 182.0 09/18/2020 1014   PLT 254 03/01/2014 0846   MCV 88.8 09/18/2020 1014   MCV 81 03/01/2014 0846   MCH 27.2 05/31/2020 0456   MCHC 32.6 09/18/2020 1014   RDW 16.6 (H) 09/18/2020 1014   RDW 15.6 (H) 03/01/2014 0846   LYMPHSABS 1.1 09/18/2020 1014   LYMPHSABS 1.0 03/01/2014 0846   MONOABS 0.3 09/18/2020 1014   MONOABS 0.3 03/01/2014 0846   EOSABS 0.1 09/18/2020 1014   EOSABS 0.1 03/01/2014 0846   BASOSABS 0.0 09/18/2020 1014   BASOSABS 0.0 03/01/2014 0846    No results found for: POCLITH, LITHIUM   No results found for: PHENYTOIN, PHENOBARB, VALPROATE, CBMZ   .res Assessment: Plan:   Pt seen for 30 minutes and time spent counseling pt regarding treatment options. Suzanne Strong reports that Suzanne Strong has been taking Buspar once daily and recommended taking it BID since this may be more effective for anxiety, particularly in the  evening, which may contribute to sleep disturbance. Recommend increasing Buspar to 1 tablet in the morning and 1/2 tablet in the evening for one week, then increasing to 1 tablet twice daily.  Suggested that Suzanne Strong may wish to try taking Lyrica slightly earlier, 1-2 hours prior to bedtime instead of 30 minutes before bedtime to determine if this is helpful for RLS.  Pt provided with samples of Belsomra 20 mg po QHS for insomnia and pt assistance application since Suzanne Strong reports Belsomra has been most effective for Suzanne Strong insomnia.  Continue Sertraline 200 mg po qd for anxiety and depression.  Continue Trazodone 50 mg po QHS for insomnia.  Continue Xanax 0.5 mg po BID prn anxiety.  Pt to follow-up in 6 months or sooner if clinically indicated.  Patient  advised to contact office with any questions, adverse effects, or acute worsening in signs and symptoms.  Suzanne Strong was seen today for anxiety and insomnia.  Diagnoses and all orders for this visit:  Anxiety state -     busPIRone (BUSPAR) 15 MG tablet; Take 1 tablet in the morning and 1/2 tablet in the evening for one week, then increase to 1 tablet twice daily -     ALPRAZolam (XANAX) 0.5 MG tablet; Take ONE tablet by MOUTH twice daily as needed FOR anxiety OR SLEEP -     sertraline (ZOLOFT) 100 MG tablet; Take 2 tablets (200 mg total) by mouth every morning.  Insomnia, unspecified -     traZODone (DESYREL) 50 MG tablet; TAKE ONE TABLET BY MOUTH EVERYDAY AT BEDTIME    Please see After Visit Summary for patient specific instructions.  Future Appointments  Date Time Provider [place]  11/09/2020 11:00 AM [facility] NURSE HEALTH ADVISOR [facility] PEC  11/26/2020 12:45 PM [facility] None  11/26/2020  1:15 PM Cammie Sickle, MD [facility] None  11/26/2020  1:45 PM [facility] INFUSION CHAIR 17 [facility] None  05/05/2021  9:30 AM Thayer Headings, PMHNP [facility] None    No orders of the defined  types were placed in this encounter.   -------------------------------

## 2020-11-04 NOTE — Patient Instructions (Signed)
Take Buspirone (Buspar) 1 tablet in the morning and 1/2 tablet in the evening for one week, then increase to 1 tablet twice daily.

## 2020-11-05 ENCOUNTER — Other Ambulatory Visit: Payer: Self-pay | Admitting: General Surgery

## 2020-11-05 ENCOUNTER — Telehealth: Payer: Self-pay

## 2020-11-05 DIAGNOSIS — M79662 Pain in left lower leg: Secondary | ICD-10-CM

## 2020-11-05 NOTE — Chronic Care Management (AMB) (Addendum)
Chronic Care Management Pharmacy Assistant   Name: LAHELA WOODIN  MRN: [id number] DOB: Jul 08, 1945  Reason for Encounter: Medication Adherence and Delivery Coordination   Recent office visits:  None since last CCM contact  Recent consult visits:  11/04/20-Behavioral Health-no data found  Hospital visits:  None in previous 6 months  Medications: Outpatient Encounter Medications as of 11/05/2020  Medication Sig   acetaminophen (TYLENOL) 325 MG tablet Take 2 tablets (650 mg total) by mouth every 6 (six) hours as needed for mild pain (or Fever >/= 101).   albuterol (VENTOLIN HFA) 108 (90 Base) MCG/ACT inhaler INHALE TWO PUFFS BY MOUTH EVERY SIX HOURS AS NEEDED   ALPRAZolam (XANAX) 0.5 MG tablet Take ONE tablet by MOUTH twice daily as needed FOR anxiety OR SLEEP   amitriptyline (ELAVIL) 50 MG tablet Take 1 tablet (50 mg total) by mouth at bedtime.   amLODipine (NORVASC) 5 MG tablet Take 1 tablet (5 mg total) by mouth daily.   atorvastatin (LIPITOR) 10 MG tablet TAKE 1 TABLET(10 MG) BY MOUTH DAILY   b complex vitamins tablet Take 1 tablet by mouth daily.   busPIRone (BUSPAR) 15 MG tablet Take 1 tablet in the morning and 1/2 tablet in the evening for one week, then increase to 1 tablet twice daily   cefdinir (OMNICEF) 300 MG capsule Take 1 capsule (300 mg total) by mouth 2 (two) times daily. (Patient not taking: Reported on 11/04/2020)   Cholecalciferol (VITAMIN D-1000 MAX ST) 25 MCG (1000 UT) tablet Take 2,000 Units by mouth daily. Takes three 50 mcg tablets, 3 capsules daily   ferrous sulfate 325 (65 FE) MG tablet Take 1 tablet (325 mg total) by mouth 2 (two) times daily with a meal.   glucose blood test strip To check blood glucose once daily and prn for diabetes   JANUMET XR 50-1000 MG TB24 Take 1 tablet by mouth 2 (two) times daily.    lidocaine-prilocaine (EMLA) cream Apply 1 application topically as needed. Apply to port when going through Chemo   MELATONIN PO Take 10 mg by  mouth.   Multiple Vitamins-Minerals (WOMENS MULTIVITAMIN PO) Take by mouth.   omeprazole (PRILOSEC) 20 MG capsule TAKE ONE CAPSULE BY MOUTH EVERYDAY AT BEDTIME   pregabalin (LYRICA) 150 MG capsule Take 300 mg by mouth at bedtime.   rOPINIRole (REQUIP) 1 MG tablet TAKE 1/2 TABLET BY MOUTH EVERY MORNING and TAKE THREE TABLETS BY MOUTH EVERYDAY AT BEDTIME   sertraline (ZOLOFT) 100 MG tablet Take 2 tablets (200 mg total) by mouth every morning.   Suvorexant (BELSOMRA) 20 MG TABS Take 20 mg by mouth at bedtime as needed.   traZODone (DESYREL) 50 MG tablet TAKE ONE TABLET BY MOUTH EVERYDAY AT BEDTIME   Facility-Administered Encounter Medications as of 11/05/2020  Medication   sodium chloride flush (NS) 0.9 % injection 10 mL   BP Readings from Last 3 Encounters:  07/08/20 115/60  06/18/20 124/60  06/11/20 136/78    Lab Results  Component Value Date   HGBA1C 6.7 (H) 05/28/2020     Recent OV, Consult or Hospital visit:  11/04/20-Behavioral Health-no data found  Last adherence delivery date:10/16/20      Patient is due for next adherence delivery on: 11/14/20  Spoke with patient on 11/06/20 reviewed medications and coordinated delivery.  This delivery to include: Adherence Packaging  30 Days  Packs: Ropinirole 1mg  -  tablet (breakfast) and 3 tablets nightly (bedtime)  Atorvastatin 10mg - 1 tablet daily (breakfast)  Amlodipine 5mg -  1 tablet daily (breakfast)   Omeprazole 20mg - 1 tablet daily (bedtime)  Amitriptyline 50mg - 1 tablet daily (bedtime)  Sertraline 100mg - 2 tablets daily (2-breakfast)  Pregabalin 150mg - 2 daily (2 bedtime) Trazodone 50mg - 1 tablet daily (bedtime)  OTC Alpha lipoic acid 200mg - 1 tablet daily (bedtime)   VIAL medications: Alprazolam 0.5mg -Take 1 tablet twice daily PRN  Janumet XR 50-1000mg   take 1 tablet 2 times daily (patient wants to know cost prior to delivery)  Patient declined the following medications this month: Melatonin 10mg  take 1 tablet daily  (patient has OTC product on hand) Albuterol HFA ( patient does not need this at this time)  Any concerns about your medications? No  How often do you forget or accidentally miss a dose? Rarely  Is patient in packaging Yes  If yes  What is the date on your next pill pack? 10/28/20  Any concerns or issues with your packaging? no  Refills requested from providers include:  N/A  Confirmed delivery date of 11/14/20, advised patient that pharmacy will contact them the morning of delivery.  Recent blood pressure readings are as follows: 133/85  79-P 154/80  80-P 130/85  72-P  Annual wellness visit in last year? Yes Most Recent BP reading: 115/60  79-P  07/08/20  If Diabetic: Most recent A1C reading:  6.5  08/05/20 Last eye exam / retinopathy screening: up to date per [facility] 08/12/20 Last diabetic foot exam: 08/12/20  [name], CPP notified  Avel Sensor, [place] Assistant [phone]  I have reviewed the care management and care coordination activities outlined in this encounter and I am certifying that I agree with the content of this note. No further action required.  [name], PharmD Clinical Pharmacist [facility] [phone]

## 2020-11-06 ENCOUNTER — Encounter: Payer: Self-pay | Admitting: Family Medicine

## 2020-11-06 MED ORDER — CEFDINIR 300 MG PO CAPS: 300.0000 mg | ORAL_CAPSULE | Freq: Two times a day (BID) | ORAL | 0 refills | Status: DC

## 2020-11-09 ENCOUNTER — Ambulatory Visit (INDEPENDENT_AMBULATORY_CARE_PROVIDER_SITE_OTHER): Payer: PPO

## 2020-11-09 DIAGNOSIS — Z0000 Encounter for general adult medical examination without abnormal findings: Secondary | ICD-10-CM | POA: Diagnosis not present

## 2020-11-09 NOTE — Progress Notes (Signed)
Subjective:  I connected with  Suzanne Strong on 11/09/20 by an audio only telemedicine application and verified that I am speaking with the correct person using two identifiers.   I discussed the limitations, risks, security and privacy concerns of performing an evaluation and management service by telephone and the availability of in person appointments. I also discussed with the patient that there may be a patient responsible charge related to this service. The patient expressed understanding and verbally consented to this telephonic visit.  Location of Patient: Home  Location of Provider: Office  List any persons and their role that are participating in the visit with the patient. None   Review of Systems    Defer to PCP       Objective:    There were no vitals filed for this visit. There is no height or weight on file to calculate BMI.  Advanced Directives 11/09/2020 05/29/2020 05/28/2020 05/28/2020 05/24/2020 02/15/2020 05/18/2019  Does Patient Have a Medical Advance Directive? No - No No No No No  Type of Advance Directive - - - - - - -  Does patient want to make changes to medical advance directive? - - - No - Patient declined - - No - Patient declined  Copy of [place] in Chart? - - - - - - -  Would patient like information on creating a medical advance directive? Yes (Inpatient - patient defers creating a medical advance directive at this time - Information given) No - Patient declined - No - Patient declined No - Patient declined No - Patient declined No - Patient declined    Current Medications (verified) Outpatient Encounter Medications as of 11/09/2020  Medication Sig   glipiZIDE (GLUCOTROL) 5 MG tablet Take 5 mg by mouth daily before breakfast.   acetaminophen (TYLENOL) 325 MG tablet Take 2 tablets (650 mg total) by mouth every 6 (six) hours as needed for mild pain (or Fever >/= 101).   albuterol (VENTOLIN HFA) 108 (90 Base) MCG/ACT inhaler INHALE  TWO PUFFS BY MOUTH EVERY SIX HOURS AS NEEDED   ALPRAZolam (XANAX) 0.5 MG tablet Take ONE tablet by MOUTH twice daily as needed FOR anxiety OR SLEEP   amitriptyline (ELAVIL) 50 MG tablet Take 1 tablet (50 mg total) by mouth at bedtime.   amLODipine (NORVASC) 5 MG tablet Take 1 tablet (5 mg total) by mouth daily.   atorvastatin (LIPITOR) 10 MG tablet TAKE 1 TABLET(10 MG) BY MOUTH DAILY   b complex vitamins tablet Take 1 tablet by mouth daily.   busPIRone (BUSPAR) 15 MG tablet Take 1 tablet in the morning and 1/2 tablet in the evening for one week, then increase to 1 tablet twice daily   cefdinir (OMNICEF) 300 MG capsule Take 1 capsule (300 mg total) by mouth 2 (two) times daily.   Cholecalciferol (VITAMIN D-1000 MAX ST) 25 MCG (1000 UT) tablet Take 2,000 Units by mouth daily. Takes three 50 mcg tablets, 3 capsules daily   ferrous sulfate 325 (65 FE) MG tablet Take 1 tablet (325 mg total) by mouth 2 (two) times daily with a meal.   glucose blood test strip To check blood glucose once daily and prn for diabetes   JANUMET XR 50-1000 MG TB24 Take 1 tablet by mouth 2 (two) times daily.    lidocaine-prilocaine (EMLA) cream Apply 1 application topically as needed. Apply to port when going through Chemo   MELATONIN PO Take 10 mg by mouth.   Multiple Vitamins-Minerals (WOMENS  MULTIVITAMIN PO) Take by mouth.   omeprazole (PRILOSEC) 20 MG capsule TAKE ONE CAPSULE BY MOUTH EVERYDAY AT BEDTIME   pregabalin (LYRICA) 150 MG capsule Take 300 mg by mouth at bedtime.   rOPINIRole (REQUIP) 1 MG tablet TAKE 1/2 TABLET BY MOUTH EVERY MORNING and TAKE THREE TABLETS BY MOUTH EVERYDAY AT BEDTIME   sertraline (ZOLOFT) 100 MG tablet Take 2 tablets (200 mg total) by mouth every morning.   Suvorexant (BELSOMRA) 20 MG TABS Take 20 mg by mouth at bedtime as needed.   traZODone (DESYREL) 50 MG tablet TAKE ONE TABLET BY MOUTH EVERYDAY AT BEDTIME   Facility-Administered Encounter Medications as of 11/09/2020  Medication    sodium chloride flush (NS) 0.9 % injection 10 mL    Allergies (verified) Amoxicillin, Penicillin g, Fluconazole, Difuleron [ferrous fumarate-dss], and Other   History: Past Medical History:  Diagnosis Date   Anemia    iron deficiency and B12 deficiency   Anxiety    Breast cancer ([place]) 02/25/2015   upper-outer quadrant of left female breast, Triple Negative. Neo-adjuvant chemo with complete pathologic response.    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   BREAST CYST ASPIRATION     BREAST EXCISIONAL BIOPSY Left 12/2015   surgery   BREAST LUMPECTOMY WITH NEEDLE LOCALIZATION Left 10/02/2015   Procedure: BREAST LUMPECTOMY WITH NEEDLE LOCALIZATION;  Surgeon: [name], MD;  Location: [facility];  Service: General;  Laterality: Left;   BREAST LUMPECTOMY WITH SENTINEL LYMPH NODE BIOPSY Left 10/02/2015   Procedure: LEFT BREAST WIDE EXCISION WITH SENTINEL LYMPH NODE BX;  Surgeon: [name], MD;  Location: [facility];  Service: General;  Laterality: Left;   BREAST SURGERY     breast biopsy benign   CARPAL TUNNEL RELEASE Right 05/19/2017   Procedure: CARPAL TUNNEL RELEASE;  Surgeon: Earnestine Leys, MD;  Location: [facility];  Service: Orthopedics;  Laterality: Right;   CATARACT EXTRACTION W/PHACO Right 10/26/2018   Procedure: CATARACT EXTRACTION PHACO AND  INTRAOCULAR LENS PLACEMENT ([place]) right diabetic;  Surgeon: Leandrew Koyanagi, MD;  Location: [place];  Service: Ophthalmology;  Laterality: Right;  Diabetic - oral meds cancer center been notified and will come   CATARACT EXTRACTION W/PHACO Left 11/16/2018   Procedure: CATARACT EXTRACTION PHACO AND INTRAOCULAR LENS PLACEMENT ([facility]) LEFT DIABETIC  01:01.0  17.0%  10.37;  Surgeon: Leandrew Koyanagi, MD;  Location: [place];  Service: Ophthalmology;  Laterality: Left;  Diabetic - oral meds Port-a-cath   CHOLECYSTECTOMY     Cologuard  07/22/2016   Negative   COLONOSCOPY WITH PROPOFOL N/A 05/24/2020   Procedure: COLONOSCOPY WITH PROPOFOL;  Surgeon: Virgel Manifold, MD;  Location: [facility];  Service: Endoscopy;  Laterality: N/A;   ESOPHAGOGASTRODUODENOSCOPY  07/2009   normal  with few gastric polyps   ESOPHAGOGASTRODUODENOSCOPY (EGD) WITH PROPOFOL N/A 05/24/2020   Procedure: ESOPHAGOGASTRODUODENOSCOPY (EGD) WITH PROPOFOL;  Surgeon: Virgel Manifold, MD;  Location: [facility];  Service: Endoscopy;  Laterality: N/A;   MOLE REMOVAL     PORT-A-CATH REMOVAL     PORTACATH PLACEMENT     PORTACATH PLACEMENT Right 03/12/2015   Procedure: INSERTION PORT-A-CATH;  Surgeon: [name], MD;  Location: [facility];  Service: General;  Laterality: Right;   RADICAL NECK DISSECTION     TONSILLECTOMY     cancer treated with chemo and radiation   TRIGGER FINGER RELEASE Right 05/19/2017   Procedure: RELEASE TRIGGER FINGER/A-1 PULLEY ring finger;  Surgeon: Earnestine Leys, MD;  Location: [facility];  Service: Orthopedics;  Laterality: Right;   Family History  Problem Relation Age of Onset   Osteoporosis Mother    Hyperlipidemia Mother    Depression Mother    Cancer Mother        skin cancer ? basal cell and lung ca heavy smoker   Alcohol abuse Father    Cancer Father        skin CA ? basal cell   Heart disease Father        CHF   Hyperlipidemia Brother    Hypertension  Brother    Breast cancer Neg Hx    Social History   Socioeconomic History   Marital status: Widowed    Spouse name: Not on file   Number of children: 0   Years of education: 14   Highest education level: Not on file  Occupational History   Occupation: book Therapist, nutritional  Tobacco Use   Smoking status: Former    Packs/day: 0.50    Years: 6.00    Pack years: 3.00    Types: Cigarettes    Quit date: 03/10/1984    Years since quitting: 36.6   Smokeless tobacco: Never  Vaping Use   Vaping Use: Never used  Substance and Sexual Activity   Alcohol use: No    Alcohol/week: 0.0 standard drinks   Drug use: No   Sexual activity: Not Currently  Other Topics Concern   Not on file  Social History Narrative   Lives with husband in a one story home.  No children.        Works as Radiation protection practitioner for [employer].        Education: college.    Social Determinants of Health   Financial Resource Strain: Low Risk    Difficulty of Paying Living Expenses: Not very hard  Food Insecurity: No Food Insecurity   Worried About Charity fundraiser in the Last Year: Never true   Ran Out of Food in the Last Year: Never true  Transportation Needs: No Transportation Needs   Lack of Transportation (Medical): No   Lack of Transportation (Non-Medical): No  Physical Activity: Sufficiently Active   Days of Exercise per Week: 4 days   Minutes of Exercise per Session: 40 min  Stress: No Stress Concern Present   Feeling of Stress : Not at all  Social Connections: Moderately Integrated   Frequency of Communication with Friends and Family: Not on file   Frequency of Social Gatherings with Friends and Family: More than three times a week   Attends Religious Services: More than 4 times per year   Active Member of [employer] or Organizations: Yes   Attends Archivist Meetings: More than 4 times per year   Marital Status: Widowed  Tobacco Counseling Counseling given: Not Answered   Clinical  Intake:  Pre-visit preparation completed: Yes  Pain : No/denies pain     Diabetes: Yes CBG done?: No Did pt. bring in CBG monitor from home?: No  How often do you need to have someone help you when you read instructions, pamphlets, or other written materials from your doctor or pharmacy?: 1 - Never  Diabetic?Yes  Interpreter Needed?: No      Activities of Daily Living In your present state of health, do you have any difficulty performing the following activities: 11/09/2020 05/29/2020  Hearing? N N  Vision? N N  Difficulty concentrating or making decisions? N N  Comment Pt expressed experiencing senior moments. -  Walking or climbing stairs? N Y  Dressing or bathing? N Y  Doing errands, shopping? N N  Preparing Food and eating ? N -  Using the Toilet? N -  In the past six months, have you accidently leaked urine? N -  Do you have problems with loss of bowel control? N -  Managing your Medications? N -  Managing your Finances? N -  Housekeeping or managing your Housekeeping? N -  Some recent data might be hidden    Patient Care Team: Tower, [name], MD as PCP - General Byrnett, [name], MD (General Surgery) [name], MD (Inactive) (Oncology) Alda Berthold, DO as Consulting Physician (Neurology) Cammie Sickle, MD as Consulting Physician (Oncology) [name], [facility] (Pharmacist) Noreene Filbert, MD as Radiation Oncologist (Radiation Oncology)  Indicate any recent Medical Services you may have received from other than [facility] providers in the past year (date may be approximate).     Assessment:   This is a routine wellness examination for Suzanne Strong.  Hearing/Vision screen No results found.  Dietary issues and exercise activities discussed:     Goals Addressed   None    Depression Screen PHQ 2/9 Scores 11/09/2020 07/15/2018 06/29/2017 06/25/2016 02/24/2016 10/25/2015 10/16/2014  PHQ - 2 Score 0 0 0 0 0 0 0  PHQ- 9 Score - 0 0 - - - -    Fall  Risk Fall Risk  11/09/2020 09/12/2019 08/03/2018 07/15/2018 04/28/2018  Falls in the past year? 1 1 1 1 1   Comment - Emmi Telephone Survey: data to providers prior to load - 4 falls due to weakness in legs -  Number falls in past yr: 1 1 1 1  -  Comment - Emmi Telephone Survey Actual Response = 4 - - -  Injury with Fall? 0 0 0 0 1  Risk for fall due to : - - Impaired balance/gait History of fall(s);Impaired balance/gait -  Follow up - - Falls evaluation completed - -    FALL RISK PREVENTION PERTAINING TO THE HOME:  Any stairs in or around the home? No  If so, are there any without handrails? No  Home free of loose throw rugs in walkways, pet beds, electrical cords, etc? Yes  Adequate lighting in your home to reduce risk of falls? Yes   ASSISTIVE DEVICES UTILIZED TO PREVENT FALLS:  Life alert? No  Use of a cane, walker or w/c? No  Grab bars in the bathroom? Yes  Shower chair or bench in shower? Yes  Elevated toilet seat or a handicapped toilet? Yes   TIMED UP AND GO:  Was the test performed?  N/A .  Length of time to ambulate 10 feet: N/A sec.   These questions cannot be answered via Telephone Encounter.  Cognitive  Function: MMSE - Mini Mental State Exam 07/15/2018 06/29/2017 06/25/2016  Orientation to time 5 5 5   Orientation to Place 5 5 5   Registration 3 3 3   Attention/ Calculation 0 0 0  Recall 3 3 3   Language- name 2 objects 0 0 0  Language- repeat 1 1 1   Language- follow 3 step command 0 3 3  Language- read & follow direction 0 0 0  Write a sentence 0 0 0  Copy design 0 0 0  Total score 17 20 20         Immunizations Immunization History  Administered Date(s) Administered   PFIZER(Purple Top)SARS-COV-2 Vaccination 03/30/2019, 04/20/2019, 11/11/2019   Pneumococcal Conjugate-13 10/16/2014   Pneumococcal Polysaccharide-23 08/31/2012   Td 06/16/2001   Tdap 06/01/2012    TDAP status: Up to date  Flu Vaccine status: Declined, Education has been provided regarding  the importance of this vaccine but patient still declined. Advised may receive this vaccine at local pharmacy or Health Dept. Aware to provide a copy of the vaccination record if obtained from local pharmacy or Health Dept. Verbalized acceptance and understanding.  Pneumococcal vaccine status: Due, Education has been provided regarding the importance of this vaccine. Advised may receive this vaccine at local pharmacy or Health Dept. Aware to provide a copy of the vaccination record if obtained from local pharmacy or Health Dept. Verbalized acceptance and understanding.  Covid-19 vaccine status: Completed vaccines  Qualifies for Shingles Vaccine? Yes   Zostavax completed No   Shingrix Completed?: Yes  Screening Tests Health Maintenance  Topic Date Due   Zoster Vaccines- Shingrix (1 of 2) Never done   FOOT EXAM  07/06/2018   Fecal DNA (Cologuard)  07/23/2019   COVID-19 Vaccine (4 - Booster for Pfizer series) 02/03/2020   INFLUENZA VACCINE  Never done   HEMOGLOBIN A1C  11/27/2020   OPHTHALMOLOGY EXAM  05/02/2021   MAMMOGRAM  05/08/2022   TETANUS/TDAP  06/02/2022   DEXA SCAN  Completed   Hepatitis C Screening  Completed   HPV VACCINES  Aged Out   URINE MICROALBUMIN  Discontinued    Health Maintenance  Health Maintenance Due  Topic Date Due   Zoster Vaccines- Shingrix (1 of 2) Never done   FOOT EXAM  07/06/2018   Fecal DNA (Cologuard)  07/23/2019   COVID-19 Vaccine (4 - Booster for Pfizer series) 02/03/2020   INFLUENZA VACCINE  Never done    Colorectal cancer screening: Type of screening: Colonoscopy. Completed 2022. Repeat every 1 years  Mammogram status: Completed 2022. Repeat every year  Bone Density status: Completed 2021. Results reflect: Bone density results: OSTEOPENIA. Repeat every 2 years.  Lung Cancer Screening: (Low Dose CT Chest recommended if Age 71-80 years, 30 pack-year currently smoking OR have quit w/in 15years.) does not qualify.   Lung Cancer Screening  Referral: N/A  Additional Screening:  Hepatitis C Screening: does qualify; Completed 2018  Vision Screening: Recommended annual ophthalmology exams for early detection of glaucoma and other disorders of the eye. Is the patient up to date with their annual eye exam?  Yes  Who is the provider or what is the name of the office in which the patient attends annual eye exams? Dr Leroy Sea If pt is not established with a provider, would they like to be referred to a provider to establish care? No .   Dental Screening: Recommended annual dental exams for proper oral hygiene  Community Resource Referral / Chronic Care Management: CRR required this visit?  No  CCM required this visit?  No      Plan:     I have personally reviewed and noted the following in the patient's chart:   Medical and social history Use of alcohol, tobacco or illicit drugs  Current medications and supplements including opioid prescriptions.  Functional ability and status Nutritional status Physical activity Advanced directives List of other physicians Hospitalizations, surgeries, and ER visits in previous 12 months Vitals Screenings to include cognitive, depression, and falls Referrals and appointments  In addition, I have reviewed and discussed with patient certain preventive protocols, quality metrics, and best practice recommendations. A written personalized care plan for preventive services as well as general preventive health recommendations were provided to patient.     Lauralyn Primes, RMA   11/09/2020   Nurse Notes: Non Face to Face 45 minute visit encounter   Ms. Cypert , Thank you for taking time to come for your Medicare Wellness Visit. I appreciate your ongoing commitment to your health goals. Please review the following plan we discussed and let me know if I can assist you in the future.   These are the goals we discussed:  Goals      Patient Stated     Starting 07/15/18, I will continue to  take medications as prescribed.      Pharmacy Care Plan     CARE PLAN ENTRY  Current Barriers:  Chronic Disease Management support, education, and care coordination needs related to diabetes, RLS  Pharmacist Clinical Goal(s):  Over the next 6 months, patient will work with PharmD and primary care provider to address the following goals: Diabetes: Maintain A1c < 7% and prevent hypoglycemia Restless Leg Syndrome: Improve symptoms of RLS Elevated BP: Check BP at home to ensure within goal < 140/90  Interventions: Comprehensive medication review performed Reviewed home BG monitoring - check daily and keep log  Reviewed home BP monitoring - check 3-4 days per week and keep log Consult Dr. Gabriel Carina to consider increasing Lyrica back to 150 mg BID  Patient Self Care Activities:  For the next 6 months until follow up visit:  Continue to monitor fasting blood glucose daily and keep log Complete Janumet paperwork and bring back to the office Begin monitoring BP 3-4 days per week and keep log. Call if consistently above 140/90.  Continue to inspect feet for cuts, scrapes, and infection daily Work towards walking 10 minutes, 2-3 days per week  Updated goal documentation        This is a list of the screening recommended for you and due dates:  Health Maintenance  Topic Date Due   Zoster (Shingles) Vaccine (1 of 2) Never done   Complete foot exam   07/06/2018   Cologuard (Stool DNA test)  07/23/2019   COVID-19 Vaccine (4 - Booster for Pfizer series) 02/03/2020   Flu Shot  Never done   Hemoglobin A1C  11/27/2020   Eye exam for diabetics  05/02/2021   Mammogram  05/08/2022   Tetanus Vaccine  06/02/2022   DEXA scan (bone density measurement)  Completed   Hepatitis C Screening: USPSTF Recommendation to screen - Ages 30-79 yo.  Completed   HPV Vaccine  Aged Out   Urine Protein Check  Discontinued

## 2020-11-12 ENCOUNTER — Other Ambulatory Visit: Payer: Self-pay

## 2020-11-12 ENCOUNTER — Ambulatory Visit
Admission: RE | Admit: 2020-11-12 | Discharge: 2020-11-12 | Disposition: A | Discharge status: Home / Self Care | Payer: PPO | Source: Ambulatory Visit | Attending: Family Medicine | Admitting: Family Medicine

## 2020-11-12 ENCOUNTER — Ambulatory Visit
Admission: RE | Admit: 2020-11-12 | Discharge: 2020-11-12 | Disposition: A | Discharge status: Home / Self Care | Payer: PPO | Source: Home / Self Care | Attending: Family Medicine | Admitting: Family Medicine

## 2020-11-12 ENCOUNTER — Ambulatory Visit
Admission: RE | Admit: 2020-11-12 | Discharge: 2020-11-12 | Disposition: A | Discharge status: Home / Self Care | Payer: PPO | Source: Ambulatory Visit | Attending: General Surgery | Admitting: General Surgery

## 2020-11-12 DIAGNOSIS — J189 Pneumonia, unspecified organism: Secondary | ICD-10-CM | POA: Diagnosis not present

## 2020-11-12 DIAGNOSIS — J690 Pneumonitis due to inhalation of food and vomit: Secondary | ICD-10-CM | POA: Insufficient documentation

## 2020-11-12 DIAGNOSIS — M7989 Other specified soft tissue disorders: Secondary | ICD-10-CM | POA: Diagnosis not present

## 2020-11-12 DIAGNOSIS — M79662 Pain in left lower leg: Secondary | ICD-10-CM | POA: Insufficient documentation

## 2020-11-12 DIAGNOSIS — M7122 Synovial cyst of popliteal space [Baker], left knee: Secondary | ICD-10-CM | POA: Diagnosis not present

## 2020-11-19 ENCOUNTER — Telehealth: Payer: Self-pay | Admitting: Family Medicine

## 2020-11-19 NOTE — Telephone Encounter (Signed)
Pt returned call about lab result . Would like a call back [phone]

## 2020-11-26 ENCOUNTER — Encounter: Payer: Self-pay | Admitting: Internal Medicine

## 2020-11-26 ENCOUNTER — Inpatient Hospital Stay: Payer: PPO | Attending: Internal Medicine | Admitting: Internal Medicine

## 2020-11-26 ENCOUNTER — Other Ambulatory Visit: Payer: Self-pay

## 2020-11-26 ENCOUNTER — Inpatient Hospital Stay: Payer: PPO

## 2020-11-26 VITALS — BP 148/70 | HR 71 | Temp 97.5°F | Resp 18 | Wt 216.4 lb

## 2020-11-26 DIAGNOSIS — Z23 Encounter for immunization: Secondary | ICD-10-CM | POA: Insufficient documentation

## 2020-11-26 DIAGNOSIS — C50412 Malignant neoplasm of upper-outer quadrant of left female breast: Secondary | ICD-10-CM

## 2020-11-26 DIAGNOSIS — Z853 Personal history of malignant neoplasm of breast: Secondary | ICD-10-CM | POA: Insufficient documentation

## 2020-11-26 DIAGNOSIS — Z171 Estrogen receptor negative status [ER-]: Secondary | ICD-10-CM

## 2020-11-26 DIAGNOSIS — Z923 Personal history of irradiation: Secondary | ICD-10-CM | POA: Diagnosis not present

## 2020-11-26 DIAGNOSIS — Z9221 Personal history of antineoplastic chemotherapy: Secondary | ICD-10-CM | POA: Diagnosis not present

## 2020-11-26 DIAGNOSIS — Z8589 Personal history of malignant neoplasm of other organs and systems: Secondary | ICD-10-CM | POA: Insufficient documentation

## 2020-11-26 LAB — CBC WITH DIFFERENTIAL/PLATELET
Abs Immature Granulocytes: 0.01 10*3/uL (ref 0.00–0.07)
Basophils Absolute: 0 10*3/uL (ref 0.0–0.1)
Basophils Relative: 1 %
Eosinophils Absolute: 0.1 10*3/uL (ref 0.0–0.5)
Eosinophils Relative: 2 %
HCT: 34.6 % — ABNORMAL LOW (ref 36.0–46.0)
Hemoglobin: 10.8 g/dL — ABNORMAL LOW (ref 12.0–15.0)
Immature Granulocytes: 0 %
Lymphocytes Relative: 27 %
Lymphs Abs: 1.1 10*3/uL (ref 0.7–4.0)
MCH: 28.6 pg (ref 26.0–34.0)
MCHC: 31.2 g/dL (ref 30.0–36.0)
MCV: 91.8 fL (ref 80.0–100.0)
Monocytes Absolute: 0.3 10*3/uL (ref 0.1–1.0)
Monocytes Relative: 8 %
Neutro Abs: 2.4 10*3/uL (ref 1.7–7.7)
Neutrophils Relative %: 62 %
Platelets: 201 10*3/uL (ref 150–400)
RBC: 3.77 MIL/uL — ABNORMAL LOW (ref 3.87–5.11)
RDW: 14.5 % (ref 11.5–15.5)
WBC: 3.9 10*3/uL — ABNORMAL LOW (ref 4.0–10.5)
nRBC: 0 % (ref 0.0–0.2)

## 2020-11-26 LAB — COMPREHENSIVE METABOLIC PANEL
ALT: 12 U/L (ref 0–44)
AST: 18 U/L (ref 15–41)
Albumin: 3.6 g/dL (ref 3.5–5.0)
Alkaline Phosphatase: 66 U/L (ref 38–126)
Anion gap: 8 (ref 5–15)
BUN: 18 mg/dL (ref 8–23)
CO2: 28 mmol/L (ref 22–32)
Calcium: 8.5 mg/dL — ABNORMAL LOW (ref 8.9–10.3)
Chloride: 102 mmol/L (ref 98–111)
Creatinine, Ser: 0.87 mg/dL (ref 0.44–1.00)
GFR, Estimated: >60 mL/min (ref >60)
Glucose, Bld: 76 mg/dL (ref 70–99)
Potassium: 4.4 mmol/L (ref 3.5–5.1)
Sodium: 138 mmol/L (ref 135–145)
Total Bilirubin: 0.1 mg/dL — ABNORMAL LOW (ref 0.3–1.2)
Total Protein: 7.3 g/dL (ref 6.5–8.1)

## 2020-11-26 MED ORDER — INFLUENZA VAC A&B SA ADJ QUAD 0.5 ML IM PRSY
0.5000 mL | PREFILLED_SYRINGE | Freq: Once | INTRAMUSCULAR | Status: DC
  Filled 2020-11-26: qty 0.5

## 2020-11-26 MED ORDER — INFLUENZA VAC A&B SA ADJ QUAD 0.5 ML IM PRSY
0.5000 mL | PREFILLED_SYRINGE | Freq: Once | INTRAMUSCULAR | Status: DC
  Administered 2020-11-26: 0.5 mL via INTRAMUSCULAR

## 2020-11-26 NOTE — Assessment & Plan Note (Addendum)
#   Left breast cancer [2017] stage III  TRIPLE NEGATIVE-  s/p neoadjuvant chemotherapy-currently under surveillance. SEP 2022- [Dr>Byrnett]- Mammo- WNL; awaiting repeat mammogram March 2023. STABLE.   # Continue close surveillance; with out evidence of recurrence; will switch over to annual surveillance starting next visit.    # Mild anemia -hemoglobin 10-11 for the last 10 years; stable.  Discussed the etiology is unclear however since his chronic asymptomatic-s/p EGD and colonoscopy 2022.  STABLE.   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 Plan reclast again in 6 months   # Vaccinations: Flu shot- today' s/p COVID vaccinations/ booster; PCP= re: pneumonia  Shot.   # DISPOSITION: # HOLD reclast- # Flu shot today # follow up in 6 m with MD/labs-cbc/cmp/ca-27-29; reclast- -Dr.B

## 2020-11-26 NOTE — Progress Notes (Signed)
[place] OFFICE PROGRESS NOTE  Patient Care Team: Tower, [name], MD as PCP - General Byrnett, [name], MD (General Surgery) [name], MD (Inactive) (Oncology) Alda Berthold, DO as Consulting Physician (Neurology) Cammie Sickle, MD as Consulting Physician (Oncology) [name], [facility] (Pharmacist) Noreene Filbert, MD as Radiation Oncologist (Radiation Oncology)  Cancer Staging No matching staging information was found for the patient.   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  5 CM TUMOR MASS BIOPSIES POSITIVE FOR INVASIVE MAMMARY CARCINOMA TRIPLE NEGATIVE DISEASE(jANUARY, 2017)stage IIIa. ER/PR/her2 Neu-NEGATIVE  # Cytoxan and Adriamycin from March 18, 2015; carbo-taxol x4 cycles  # AUg 18th s/p Lumpec & SLNBx- Complete path CR [ypT0 ypsn0] s/p RT [oct 2017; Dr.Crystal]  3.  MUGA scan of the heart shows ejection fraction to be 61% (January, 2017)   EGD/colonoscopy- Emmaline Kluver 2022]-  ------------------------------------------------------------    DIAGNOSIS: BREAST CANCER  STAGE:  III       ;GOALS: curative  CURRENT/MOST RECENT THERAPY: surveillaince.     Carcinoma of upper-outer quadrant of left breast in female, estrogen receptor negative ([place])  11/21/2015 Initial Diagnosis   Carcinoma of upper-outer quadrant of left breast in female, estrogen receptor negative ([place])     INTERVAL HISTORY: Ambulating independently.  Alone.  Bosie Helper Dumas 75 y.o.  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  No nausea no vomiting.  Appetite is good.  No headaches.  No weight loss.  Chronic tingling and numbness in extremities.  She  continues to be on Lyrica/ Amitriptyline.   Review of Systems  Constitutional:  Negative for chills, diaphoresis, fever, malaise/fatigue and weight loss.  HENT:  Negative for nosebleeds and sore throat.   Eyes:  Negative for double vision.  Respiratory:  Negative for hemoptysis, shortness of breath and wheezing.   Cardiovascular:  Negative for chest pain, palpitations, orthopnea and leg swelling.  Gastrointestinal:  Negative for abdominal pain, blood in stool, constipation, diarrhea, heartburn, melena, nausea and vomiting.  Genitourinary:  Negative for dysuria, frequency and urgency.  Musculoskeletal:  Negative for back pain and joint pain.  Skin: Negative.  Negative for itching and rash.  Neurological:  Positive for tingling. Negative for focal weakness, weakness and headaches.  Endo/Heme/Allergies:  Does not bruise/bleed easily.  Psychiatric/Behavioral:  Negative for depression. The patient is not nervous/anxious and does not have insomnia.      PAST MEDICAL HISTORY :  Past Medical History:  Diagnosis Date   Anemia    iron deficiency and B12 deficiency   Anxiety    Breast cancer ([place]) 02/25/2015   upper-outer quadrant of left female breast, Triple Negative. Neo-adjuvant chemo with complete pathologic response.    Cervical dysplasia    conization   Diabetes mellitus without complication ([place])    Diverticulosis    Fever 04/11/2015   Gastroparesis    related to previous radiation tx   GERD (gastroesophageal reflux disease)    Hyperlipidemia    Hypertension    Neuropathy    legs/feet, S/P chemo meds   Personal history of chemotherapy    Personal history of radiation therapy 2017   LEFT lumpectomy w/ radiation   RLS (restless legs syndrome)    Shingles    Hx of  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[place]) 2006   Wears dentures    partial bottom    PAST SURGICAL HISTORY :   Past Surgical History:  Procedure Laterality Date   APPENDECTOMY     BREAST BIOPSY Left 02/25/2015   INVASIVE  MAMMARY CARCINOMA,Triple negative.   BREAST CYST ASPIRATION     BREAST EXCISIONAL BIOPSY Left 12/2015   surgery   BREAST LUMPECTOMY WITH NEEDLE LOCALIZATION Left 10/02/2015   Procedure: BREAST LUMPECTOMY WITH NEEDLE LOCALIZATION;  Surgeon: [name], MD;  Location: [facility];  Service: General;  Laterality: Left;   BREAST LUMPECTOMY WITH SENTINEL LYMPH NODE BIOPSY Left 10/02/2015   Procedure: LEFT BREAST WIDE EXCISION WITH SENTINEL LYMPH NODE BX;  Surgeon: [name], MD;  Location: [facility];  Service: General;  Laterality: Left;   BREAST SURGERY     breast biopsy benign   CARPAL TUNNEL RELEASE Right 05/19/2017   Procedure: CARPAL TUNNEL RELEASE;  Surgeon: Earnestine Leys, MD;  Location: [facility];  Service: Orthopedics;  Laterality: Right;   CATARACT EXTRACTION W/PHACO Right 10/26/2018   Procedure: CATARACT EXTRACTION PHACO AND INTRAOCULAR LENS PLACEMENT ([place]) right diabetic;  Surgeon: Leandrew Koyanagi, MD;  Location: [place];  Service: Ophthalmology;  Laterality: Right;  Diabetic - oral meds cancer center been notified and will come   CATARACT EXTRACTION W/PHACO Left 11/16/2018   Procedure: CATARACT EXTRACTION PHACO AND INTRAOCULAR LENS PLACEMENT ([facility]) LEFT DIABETIC  01:01.0  17.0%  10.37;  Surgeon: Leandrew Koyanagi, MD;  Location: [place];  Service: Ophthalmology;  Laterality: Left;  Diabetic - oral meds Port-a-cath   CHOLECYSTECTOMY     Cologuard  07/22/2016   Negative   COLONOSCOPY WITH PROPOFOL N/A 05/24/2020   Procedure: COLONOSCOPY WITH PROPOFOL;  Surgeon: Virgel Manifold, MD;  Location: [facility];  Service: Endoscopy;  Laterality: N/A;   ESOPHAGOGASTRODUODENOSCOPY  07/2009   normal with few gastric polyps   ESOPHAGOGASTRODUODENOSCOPY (EGD) WITH PROPOFOL N/A 05/24/2020   Procedure: ESOPHAGOGASTRODUODENOSCOPY (EGD) WITH PROPOFOL;  Surgeon: Virgel Manifold, MD;  Location: [facility];  Service: Endoscopy;  Laterality: N/A;   MOLE  REMOVAL     PORT-A-CATH REMOVAL     PORTACATH PLACEMENT     PORTACATH PLACEMENT Right 03/12/2015   Procedure: INSERTION PORT-A-CATH;  Surgeon: [name], MD;  Location: [facility];  Service: General;  Laterality: Right;   RADICAL NECK DISSECTION     TONSILLECTOMY     cancer treated with chemo and radiation   TRIGGER FINGER RELEASE Right 05/19/2017   Procedure: RELEASE TRIGGER FINGER/A-1 PULLEY ring finger;  Surgeon: Earnestine Leys, MD;  Location: [facility];  Service: Orthopedics;  Laterality: Right;    FAMILY HISTORY :   Family History  Problem Relation Age of Onset   Osteoporosis Mother    Hyperlipidemia Mother    Depression Mother    Cancer Mother        skin cancer ? basal cell and lung ca heavy smoker   Alcohol abuse Father    Cancer Father        skin CA ? basal cell   Heart disease Father        CHF   Hyperlipidemia Brother    Hypertension Brother    Breast cancer Neg Hx     SOCIAL HISTORY:   Social History   Tobacco Use   Smoking status: Former    Packs/day: 0.50    Years: 6.00    Pack years: 3.00    Types: Cigarettes    Quit  date: 03/10/1984    Years since quitting: 36.7   Smokeless tobacco: Never  Vaping Use   Vaping Use: Never used  Substance Use Topics   Alcohol use: No    Alcohol/week: 0.0 standard drinks   Drug use: No    ALLERGIES:  is allergic to amoxicillin, penicillin g, fluconazole, difuleron [ferrous fumarate-dss], and other.  MEDICATIONS:  Current Outpatient Medications  Medication Sig Dispense Refill   acetaminophen (TYLENOL) 325 MG tablet Take 2 tablets (650 mg total) by mouth every 6 (six) hours as needed for mild pain (or Fever >/= 101).     albuterol (VENTOLIN HFA) 108 (90 Base) MCG/ACT inhaler INHALE TWO PUFFS BY MOUTH EVERY SIX HOURS AS NEEDED 8.5 g 3   ALPRAZolam (XANAX) 0.5 MG tablet Take ONE tablet by MOUTH twice daily as needed FOR anxiety OR SLEEP 60 tablet 5   amitriptyline (ELAVIL) 50 MG tablet Take 1 tablet (50 mg total) by  mouth at bedtime. 90 tablet 3   amLODipine (NORVASC) 5 MG tablet Take 1 tablet (5 mg total) by mouth daily. 90 tablet 3   atorvastatin (LIPITOR) 10 MG tablet TAKE 1 TABLET(10 MG) BY MOUTH DAILY 90 tablet 3   b complex vitamins tablet Take 1 tablet by mouth daily.     busPIRone (BUSPAR) 15 MG tablet Take 1 tablet in the morning and 1/2 tablet in the evening for one week, then increase to 1 tablet twice daily 180 tablet 1   Cholecalciferol (VITAMIN D-1000 MAX ST) 25 MCG (1000 UT) tablet Take 2,000 Units by mouth daily. Takes three 50 mcg tablets, 3 capsules daily     Emollient (COLLAGEN EX)      ferrous sulfate 325 (65 FE) MG tablet Take 1 tablet (325 mg total) by mouth 2 (two) times daily with a meal.     glipiZIDE (GLUCOTROL) 5 MG tablet Take 5 mg by mouth daily before breakfast.     glucose blood test strip To check blood glucose once daily and prn for diabetes 100 each 3   JANUMET XR 50-1000 MG TB24 Take 1 tablet by mouth 2 (two) times daily.   0   lidocaine-prilocaine (EMLA) cream Apply 1 application topically as needed. Apply to port when going through Chemo     MELATONIN PO Take 10 mg by mouth.     Multiple Vitamins-Minerals (WOMENS MULTIVITAMIN PO) Take by mouth.     omeprazole (PRILOSEC) 20 MG capsule TAKE ONE CAPSULE BY MOUTH EVERYDAY AT BEDTIME 90 capsule 3   pregabalin (LYRICA) 150 MG capsule Take 300 mg by mouth at bedtime.     rOPINIRole (REQUIP) 1 MG tablet TAKE 1/2 TABLET BY MOUTH EVERY MORNING and TAKE THREE TABLETS BY MOUTH EVERYDAY AT BEDTIME 315 tablet 5   sertraline (ZOLOFT) 100 MG tablet Take 2 tablets (200 mg total) by mouth every morning. 180 tablet 1   Suvorexant (BELSOMRA) 20 MG TABS Take 20 mg by mouth at bedtime as needed. 15 tablet 0   traZODone (DESYREL) 50 MG tablet TAKE ONE TABLET BY MOUTH EVERYDAY AT BEDTIME 90 tablet 1   No current facility-administered medications for this visit.   Facility-Administered Medications Ordered in Other Visits  Medication Dose  Route Frequency Provider Last Rate Last Admin   influenza vaccine adjuvanted (FLUAD) injection 0.5 mL  0.5 mL Intramuscular Once Charlaine Dalton R, MD       sodium chloride flush (NS) 0.9 % injection 10 mL  10 mL Intravenous PRN Cammie Sickle, MD  10 mL at 09/11/15 0845    PHYSICAL EXAMINATION: ECOG PERFORMANCE STATUS: 1 - Symptomatic but completely ambulatory  BP (!) 148/70   Pulse 71   Temp (!) 97.5 F (36.4 C)   Resp 18   Wt 216 lb 6.4 oz (98.2 kg)   SpO2 94%   BMI 34.93 kg/m   Filed Weights   11/26/20 1315  Weight: 216 lb 6.4 oz (98.2 kg)    Physical Exam Constitutional:      Comments: Obese.  She is walking herself.  Alone.  HENT:     Head: Normocephalic and atraumatic.     Mouth/Throat:     Pharynx: No oropharyngeal exudate.  Eyes:     Pupils: Pupils are equal, round, and reactive to light.  Cardiovascular:     Rate and Rhythm: Normal rate and regular rhythm.  Pulmonary:     Effort: No respiratory distress.     Breath sounds: No wheezing.  Abdominal:     General: Bowel sounds are normal. There is no distension.     Palpations: Abdomen is soft. There is no mass.     Tenderness: There is no abdominal tenderness. There is no guarding or rebound.  Musculoskeletal:        General: No tenderness. Normal range of motion.     Cervical back: Normal range of motion and neck supple.  Skin:    General: Skin is warm.  Neurological:     Mental Status: She is alert and oriented to person, place, and time.  Psychiatric:        Mood and Affect: Affect normal.    LABORATORY DATA:  I have reviewed the data as listed    Component Value Date/Time   NA 138 11/26/2020 1256   NA 142 03/01/2014 0846   K 4.4 11/26/2020 1256   K 4.3 03/01/2014 0846   CL 102 11/26/2020 1256   CL 102 03/01/2014 0846   CO2 28 11/26/2020 1256   CO2 28 03/01/2014 0846   GLUCOSE 76 11/26/2020 1256   GLUCOSE 219 (H) 03/01/2014 0846   BUN 18 11/26/2020 1256   BUN 12 03/01/2014 0846    CREATININE 0.87 11/26/2020 1256   CREATININE 0.95 03/01/2014 0846   CALCIUM 8.5 (L) 11/26/2020 1256   CALCIUM 8.6 03/01/2014 0846   PROT 7.3 11/26/2020 1256   PROT 7.5 03/01/2014 0846   ALBUMIN 3.6 11/26/2020 1256   ALBUMIN 3.4 03/01/2014 0846   AST 18 11/26/2020 1256   AST 20 03/01/2014 0846   ALT 12 11/26/2020 1256   ALT 28 03/01/2014 0846   ALKPHOS 66 11/26/2020 1256   ALKPHOS 122 (H) 03/01/2014 0846   BILITOT 0.1 (L) 11/26/2020 1256   BILITOT 0.2 03/01/2014 0846   GFRNONAA >60 11/26/2020 1256   GFRNONAA >60 03/01/2014 0846   GFRNONAA >60 08/24/2013 1015   GFRAA >60 10/20/2019 0950   GFRAA >60 03/01/2014 0846   GFRAA >60 08/24/2013 1015    No results found for: SPEP, UPEP  Lab Results  Component Value Date   WBC 3.9 (L) 11/26/2020   NEUTROABS 2.4 11/26/2020   HGB 10.8 (L) 11/26/2020   HCT 34.6 (L) 11/26/2020   MCV 91.8 11/26/2020   PLT 201 11/26/2020      Chemistry      Component Value Date/Time   NA 138 11/26/2020 1256   NA 142 03/01/2014 0846   K 4.4 11/26/2020 1256   K 4.3 03/01/2014 0846   CL 102 11/26/2020 1256   CL  102 03/01/2014 0846   CO2 28 11/26/2020 1256   CO2 28 03/01/2014 0846   BUN 18 11/26/2020 1256   BUN 12 03/01/2014 0846   CREATININE 0.87 11/26/2020 1256   CREATININE 0.95 03/01/2014 0846      Component Value Date/Time   CALCIUM 8.5 (L) 11/26/2020 1256   CALCIUM 8.6 03/01/2014 0846   ALKPHOS 66 11/26/2020 1256   ALKPHOS 122 (H) 03/01/2014 0846   AST 18 11/26/2020 1256   AST 20 03/01/2014 0846   ALT 12 11/26/2020 1256   ALT 28 03/01/2014 0846   BILITOT 0.1 (L) 11/26/2020 1256   BILITOT 0.2 03/01/2014 0846       RADIOGRAPHIC STUDIES: I have personally reviewed the radiological images as listed and agreed with the findings in the report. No results found.   ASSESSMENT & PLAN:  Carcinoma of upper-outer quadrant of left breast in female, estrogen receptor negative ([place]) # Left breast cancer [2017] stage III  TRIPLE NEGATIVE-   s/p neoadjuvant chemotherapy-currently under surveillance. SEP 2022- [Dr>Byrnett]- Mammo- WNL; awaiting repeat mammogram March 2023. STABLE.   # Continue close surveillance; with out evidence of recurrence; will switch over to annual surveillance starting next visit.    # Mild anemia -hemoglobin 10-11 for the last 10 years; stable.  Discussed the etiology is unclear however since his chronic asymptomatic-s/p EGD and colonoscopy 2022.  STABLE.   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 Plan reclast again in 6 months   # Vaccinations: Flu shot- today' s/p COVID vaccinations/ booster; PCP= re: pneumonia  Shot.   # DISPOSITION: # HOLD reclast- # Flu shot today # follow up in 6 m with MD/labs-cbc/cmp/ca-27-29; reclast- -Dr.B   Orders Placed This Encounter  Procedures   CBC with Differential/Platelet    Standing Status:   Future    Standing Expiration Date:   11/26/2021   Comprehensive metabolic panel    Standing Status:   Future    Standing Expiration Date:   11/26/2021   Cancer antigen 27.29    Standing Status:   Future    Standing Expiration Date:   11/26/2021   All questions were answered. The patient knows to call the clinic with any problems, questions or concerns.      Cammie Sickle, MD 11/26/2020 4:33 PM

## 2020-11-27 LAB — CANCER ANTIGEN 27.29: CA 27.29: 12.9 U/mL (ref 0.0–38.6)

## 2020-12-01 ENCOUNTER — Other Ambulatory Visit: Payer: Self-pay | Admitting: Psychiatry

## 2020-12-01 DIAGNOSIS — F5101 Primary insomnia: Secondary | ICD-10-CM

## 2020-12-03 ENCOUNTER — Other Ambulatory Visit: Payer: Self-pay | Admitting: Family Medicine

## 2020-12-04 ENCOUNTER — Telehealth: Payer: Self-pay

## 2020-12-04 NOTE — Progress Notes (Addendum)
Chronic Care Management Pharmacy Assistant   Name: Suzanne Strong  MRN: [id number] DOB: 06-17-1945  Reason for Encounter: CCM ([place] Adherence and Delivery Coordination)   Recent office visits:  11/09/2020 - Lauralyn Primes, RMA - Patient presented for Annual Wellness Visit; No medication changes.  11/06/2020 - [name], MD - Message - Patient stated she had a fever and cough. Start: cefdinir (OMNICEF) 300 MG capsule.  Recent consult visits:  11/26/2020 - Oncology - Patient presented for follow up Carcinoma of upper-outer quadrant of left breast. Orders: Cancer Antigen, CBC and CMP. Stop: cefdinir (OMNICEF) 300 MG capsule as completed course. Infusion given: Fluad Quad.  11/14/2020 - General Surgery - Telephone - No information noted.   Hospital visits:  None in previous 6 months  Medications: Outpatient Encounter Medications as of 12/04/2020  Medication Sig   acetaminophen (TYLENOL) 325 MG tablet Take 2 tablets (650 mg total) by mouth every 6 (six) hours as needed for mild pain (or Fever >/= 101).   albuterol (VENTOLIN HFA) 108 (90 Base) MCG/ACT inhaler INHALE TWO PUFFS BY MOUTH EVERY SIX HOURS AS NEEDED   ALPRAZolam (XANAX) 0.5 MG tablet Take ONE tablet by MOUTH twice daily as needed FOR anxiety OR SLEEP   amitriptyline (ELAVIL) 50 MG tablet Take 1 tablet (50 mg total) by mouth at bedtime.   amLODipine (NORVASC) 5 MG tablet Take 1 tablet (5 mg total) by mouth daily.   atorvastatin (LIPITOR) 10 MG tablet TAKE 1 TABLET(10 MG) BY MOUTH DAILY   b complex vitamins tablet Take 1 tablet by mouth daily.   busPIRone (BUSPAR) 15 MG tablet Take 1 tablet in the morning and 1/2 tablet in the evening for one week, then increase to 1 tablet twice daily   Cholecalciferol (VITAMIN D-1000 MAX ST) 25 MCG (1000 UT) tablet Take 2,000 Units by mouth daily. Takes three 50 mcg tablets, 3 capsules daily   Emollient (COLLAGEN EX)    ferrous sulfate 325 (65 FE) MG tablet Take 1 tablet (325 mg  total) by mouth 2 (two) times daily with a meal.   glipiZIDE (GLUCOTROL) 5 MG tablet Take 5 mg by mouth daily before breakfast.   glucose blood (ONETOUCH ULTRA) test strip USE TO check blood glucose ONCE DAILY AND AS NEEDED FOR DIABETES   JANUMET XR 50-1000 MG TB24 Take 1 tablet by mouth 2 (two) times daily.    lidocaine-prilocaine (EMLA) cream Apply 1 application topically as needed. Apply to port when going through Chemo   MELATONIN PO Take 10 mg by mouth.   Multiple Vitamins-Minerals (WOMENS MULTIVITAMIN PO) Take by mouth.   omeprazole (PRILOSEC) 20 MG capsule TAKE ONE CAPSULE BY MOUTH EVERYDAY AT BEDTIME   pregabalin (LYRICA) 150 MG capsule Take 300 mg by mouth at bedtime.   rOPINIRole (REQUIP) 1 MG tablet TAKE 1/2 TABLET BY MOUTH EVERY MORNING and TAKE THREE TABLETS BY MOUTH EVERYDAY AT BEDTIME   sertraline (ZOLOFT) 100 MG tablet Take 2 tablets (200 mg total) by mouth every morning.   Suvorexant (BELSOMRA) 20 MG TABS Take 20 mg by mouth at bedtime as needed.   traZODone (DESYREL) 50 MG tablet TAKE ONE TABLET BY MOUTH EVERYDAY AT BEDTIME   Facility-Administered Encounter Medications as of 12/04/2020  Medication   influenza vaccine adjuvanted (FLUAD) injection 0.5 mL   sodium chloride flush (NS) 0.9 % injection 10 mL    BP Readings from Last 3 Encounters:  11/26/20 (!) 148/70  07/08/20 115/60  06/18/20 124/60    Lab Results  Component Value Date   HGBA1C 6.7 (H) 05/28/2020    Recent OV, Consult or Hospital visit:  No medication changes indicated  Last adherence delivery date: 11/14/2020  Patient is due for next adherence delivery on: 12/16/2020  Multiple attempts made to reach patient. Unsuccessful outreach. Will refill based off of last adherence fill.   This delivery to include: Adherence Packaging  30 Days  Packs: Pregabalin 150mg - 2 daily (2 bedtime) Sertraline 100mg - 2 tablets daily (2-breakfast)  Ropinirole 1mg  -  tablet (breakfast) and 3 tablets nightly (bedtime)   Amitriptyline 50mg - 1 tablet daily (bedtime)  OTC Alpha lipoic acid 200mg - 1 tablet daily (bedtime)  Atorvastatin 10mg - 1 tablet daily (breakfast)  Amlodipine 5mg - 1 tablet daily (breakfast)   Omeprazole 20mg - 1 tablet daily (bedtime)   VIAL medications: Alprazolam 0.5mg -Take 1 tablet twice daily PRN  Janumet XR 50-1000mg   take 1 tablet 2 times daily Onetouch Ultra Trest Strips  Annual wellness visit in last year? Yes Most Recent BP reading: 148/70 on 11/26/2020  If Diabetic: Most recent A1C reading: 6.5 on 08/07/2020 Last eye exam / retinopathy screening: Up to date Last diabetic foot exam: 07/05/2017  [name], CPP notified  Marijean Niemann, [place] Assistant [phone]   I have reviewed the care management and care coordination activities outlined in this encounter and I am certifying that I agree with the content of this note. No further action required.  [name], PharmD Clinical Pharmacist [facility] at [facility] [phone]

## 2020-12-06 ENCOUNTER — Encounter: Payer: Self-pay | Admitting: *Deleted

## 2020-12-09 ENCOUNTER — Inpatient Hospital Stay
Admission: EM | Admit: 2020-12-09 | Discharge: 2020-12-11 | DRG: 871 | Disposition: A | Discharge status: Home / Self Care | Payer: PPO | Attending: Internal Medicine | Admitting: Internal Medicine

## 2020-12-09 ENCOUNTER — Emergency Department: Payer: PPO

## 2020-12-09 ENCOUNTER — Other Ambulatory Visit: Payer: Self-pay

## 2020-12-09 DIAGNOSIS — R000 Tachycardia, unspecified: Secondary | ICD-10-CM | POA: Diagnosis not present

## 2020-12-09 DIAGNOSIS — E1142 Type 2 diabetes mellitus with diabetic polyneuropathy: Secondary | ICD-10-CM | POA: Diagnosis present

## 2020-12-09 DIAGNOSIS — D509 Iron deficiency anemia, unspecified: Secondary | ICD-10-CM | POA: Diagnosis not present

## 2020-12-09 DIAGNOSIS — I10 Essential (primary) hypertension: Secondary | ICD-10-CM | POA: Diagnosis present

## 2020-12-09 DIAGNOSIS — Z9049 Acquired absence of other specified parts of digestive tract: Secondary | ICD-10-CM

## 2020-12-09 DIAGNOSIS — Z87410 Personal history of cervical dysplasia: Secondary | ICD-10-CM

## 2020-12-09 DIAGNOSIS — G9341 Metabolic encephalopathy: Secondary | ICD-10-CM | POA: Diagnosis not present

## 2020-12-09 DIAGNOSIS — M6282 Rhabdomyolysis: Secondary | ICD-10-CM | POA: Diagnosis present

## 2020-12-09 DIAGNOSIS — I700 Atherosclerosis of aorta: Secondary | ICD-10-CM | POA: Diagnosis present

## 2020-12-09 DIAGNOSIS — Z87891 Personal history of nicotine dependence: Secondary | ICD-10-CM

## 2020-12-09 DIAGNOSIS — Z20822 Contact with and (suspected) exposure to covid-19: Secondary | ICD-10-CM | POA: Diagnosis not present

## 2020-12-09 DIAGNOSIS — J690 Pneumonitis due to inhalation of food and vomit: Secondary | ICD-10-CM | POA: Diagnosis not present

## 2020-12-09 DIAGNOSIS — K3184 Gastroparesis: Secondary | ICD-10-CM | POA: Diagnosis not present

## 2020-12-09 DIAGNOSIS — E669 Obesity, unspecified: Secondary | ICD-10-CM | POA: Diagnosis present

## 2020-12-09 DIAGNOSIS — F32A Depression, unspecified: Secondary | ICD-10-CM | POA: Diagnosis present

## 2020-12-09 DIAGNOSIS — F419 Anxiety disorder, unspecified: Secondary | ICD-10-CM | POA: Diagnosis not present

## 2020-12-09 DIAGNOSIS — Z880 Allergy status to penicillin: Secondary | ICD-10-CM

## 2020-12-09 DIAGNOSIS — A419 Sepsis, unspecified organism: Secondary | ICD-10-CM | POA: Diagnosis not present

## 2020-12-09 DIAGNOSIS — E872 Acidosis, unspecified: Secondary | ICD-10-CM | POA: Diagnosis present

## 2020-12-09 DIAGNOSIS — N179 Acute kidney failure, unspecified: Secondary | ICD-10-CM | POA: Diagnosis not present

## 2020-12-09 DIAGNOSIS — W19XXXA Unspecified fall, initial encounter: Secondary | ICD-10-CM | POA: Diagnosis not present

## 2020-12-09 DIAGNOSIS — Z85819 Personal history of malignant neoplasm of unspecified site of lip, oral cavity, and pharynx: Secondary | ICD-10-CM

## 2020-12-09 DIAGNOSIS — Z923 Personal history of irradiation: Secondary | ICD-10-CM

## 2020-12-09 DIAGNOSIS — E538 Deficiency of other specified B group vitamins: Secondary | ICD-10-CM | POA: Diagnosis not present

## 2020-12-09 DIAGNOSIS — R531 Weakness: Secondary | ICD-10-CM

## 2020-12-09 DIAGNOSIS — Z713 Dietary counseling and surveillance: Secondary | ICD-10-CM

## 2020-12-09 DIAGNOSIS — Z7984 Long term (current) use of oral hypoglycemic drugs: Secondary | ICD-10-CM

## 2020-12-09 DIAGNOSIS — G2581 Restless legs syndrome: Secondary | ICD-10-CM | POA: Diagnosis not present

## 2020-12-09 DIAGNOSIS — Z888 Allergy status to other drugs, medicaments and biological substances status: Secondary | ICD-10-CM

## 2020-12-09 DIAGNOSIS — Z881 Allergy status to other antibiotic agents status: Secondary | ICD-10-CM

## 2020-12-09 DIAGNOSIS — E785 Hyperlipidemia, unspecified: Secondary | ICD-10-CM | POA: Diagnosis not present

## 2020-12-09 DIAGNOSIS — Z9221 Personal history of antineoplastic chemotherapy: Secondary | ICD-10-CM

## 2020-12-09 DIAGNOSIS — Z853 Personal history of malignant neoplasm of breast: Secondary | ICD-10-CM

## 2020-12-09 DIAGNOSIS — Z8249 Family history of ischemic heart disease and other diseases of the circulatory system: Secondary | ICD-10-CM

## 2020-12-09 DIAGNOSIS — K219 Gastro-esophageal reflux disease without esophagitis: Secondary | ICD-10-CM | POA: Diagnosis not present

## 2020-12-09 DIAGNOSIS — J9601 Acute respiratory failure with hypoxia: Secondary | ICD-10-CM | POA: Diagnosis not present

## 2020-12-09 DIAGNOSIS — Z8701 Personal history of pneumonia (recurrent): Secondary | ICD-10-CM

## 2020-12-09 DIAGNOSIS — I248 Other forms of acute ischemic heart disease: Secondary | ICD-10-CM | POA: Diagnosis not present

## 2020-12-09 DIAGNOSIS — Z6833 Body mass index (BMI) 33.0-33.9, adult: Secondary | ICD-10-CM

## 2020-12-09 DIAGNOSIS — E1143 Type 2 diabetes mellitus with diabetic autonomic (poly)neuropathy: Secondary | ICD-10-CM | POA: Diagnosis not present

## 2020-12-09 DIAGNOSIS — J189 Pneumonia, unspecified organism: Secondary | ICD-10-CM

## 2020-12-09 DIAGNOSIS — Z79899 Other long term (current) drug therapy: Secondary | ICD-10-CM

## 2020-12-09 DIAGNOSIS — Z79891 Long term (current) use of opiate analgesic: Secondary | ICD-10-CM

## 2020-12-09 LAB — CBC WITH DIFFERENTIAL/PLATELET
Abs Immature Granulocytes: 0.66 10*3/uL — ABNORMAL HIGH (ref 0.00–0.07)
Basophils Absolute: 0.1 10*3/uL (ref 0.0–0.1)
Basophils Relative: 0 %
Eosinophils Absolute: 0.1 10*3/uL (ref 0.0–0.5)
Eosinophils Relative: 0 %
HCT: 39 % (ref 36.0–46.0)
Hemoglobin: 12.7 g/dL (ref 12.0–15.0)
Immature Granulocytes: 3 %
Lymphocytes Relative: 5 %
Lymphs Abs: 1 10*3/uL (ref 0.7–4.0)
MCH: 29.8 pg (ref 26.0–34.0)
MCHC: 32.6 g/dL (ref 30.0–36.0)
MCV: 91.5 fL (ref 80.0–100.0)
Monocytes Absolute: 1 10*3/uL (ref 0.1–1.0)
Monocytes Relative: 5 %
Neutro Abs: 17.5 10*3/uL — ABNORMAL HIGH (ref 1.7–7.7)
Neutrophils Relative %: 87 %
Platelets: 213 10*3/uL (ref 150–400)
RBC: 4.26 MIL/uL (ref 3.87–5.11)
RDW: 14.6 % (ref 11.5–15.5)
Smear Review: NORMAL
WBC: 20.6 10*3/uL — ABNORMAL HIGH (ref 4.0–10.5)
nRBC: 0 % (ref 0.0–0.2)

## 2020-12-09 LAB — COMPREHENSIVE METABOLIC PANEL
ALT: 30 U/L (ref 0–44)
AST: 137 U/L — ABNORMAL HIGH (ref 15–41)
Albumin: 3.4 g/dL — ABNORMAL LOW (ref 3.5–5.0)
Alkaline Phosphatase: 48 U/L (ref 38–126)
Anion gap: 13 (ref 5–15)
BUN: 30 mg/dL — ABNORMAL HIGH (ref 8–23)
CO2: 23 mmol/L (ref 22–32)
Calcium: 8.5 mg/dL — ABNORMAL LOW (ref 8.9–10.3)
Chloride: 100 mmol/L (ref 98–111)
Creatinine, Ser: 1.75 mg/dL — ABNORMAL HIGH (ref 0.44–1.00)
GFR, Estimated: 30 mL/min — ABNORMAL LOW (ref >60)
Glucose, Bld: 152 mg/dL — ABNORMAL HIGH (ref 70–99)
Potassium: 4.5 mmol/L (ref 3.5–5.1)
Sodium: 136 mmol/L (ref 135–145)
Total Bilirubin: 1.3 mg/dL — ABNORMAL HIGH (ref 0.3–1.2)
Total Protein: 7.8 g/dL (ref 6.5–8.1)

## 2020-12-09 LAB — RESP PANEL BY RT-PCR (FLU A&B, COVID) ARPGX2
Influenza A by PCR: NEGATIVE
Influenza B by PCR: NEGATIVE
SARS Coronavirus 2 by RT PCR: NEGATIVE

## 2020-12-09 LAB — PROTIME-INR
INR: 1.3 — ABNORMAL HIGH (ref 0.8–1.2)
Prothrombin Time: 16.2 seconds — ABNORMAL HIGH (ref 11.4–15.2)

## 2020-12-09 LAB — TROPONIN I (HIGH SENSITIVITY)
Troponin I (High Sensitivity): 562 ng/L (ref <18)
Troponin I (High Sensitivity): 574 ng/L (ref <18)

## 2020-12-09 LAB — APTT: aPTT: 34 seconds (ref 24–36)

## 2020-12-09 LAB — LACTIC ACID, PLASMA: Lactic Acid, Venous: 3.1 mmol/L (ref 0.5–1.9)

## 2020-12-09 LAB — CK: Total CK: 6349 U/L — ABNORMAL HIGH (ref 38–234)

## 2020-12-09 MED ORDER — ENOXAPARIN SODIUM 60 MG/0.6ML IJ SOSY
0.5000 mg/kg | PREFILLED_SYRINGE | INTRAMUSCULAR | Status: DC
  Administered 2020-12-10 (×2): 47.5 mg via SUBCUTANEOUS
  Filled 2020-12-09 (×2): qty 0.6

## 2020-12-09 MED ORDER — BUSPIRONE HCL 15 MG PO TABS
15.0000 mg | ORAL_TABLET | Freq: Two times a day (BID) | ORAL | Status: DC
  Administered 2020-12-10 – 2020-12-11 (×3): 15 mg via ORAL
  Filled 2020-12-09: qty 1
  Filled 2020-12-09: qty 3
  Filled 2020-12-09 (×2): qty 1

## 2020-12-09 MED ORDER — ACETAMINOPHEN 650 MG RE SUPP: 650.0000 mg | Freq: Four times a day (QID) | RECTAL | Status: DC | PRN

## 2020-12-09 MED ORDER — SERTRALINE HCL 50 MG PO TABS
200.0000 mg | ORAL_TABLET | Freq: Every morning | ORAL | Status: DC
  Administered 2020-12-10: 200 mg via ORAL
  Filled 2020-12-09: qty 4

## 2020-12-09 MED ORDER — SUVOREXANT 20 MG PO TABS: 20.0000 mg | ORAL_TABLET | Freq: Every evening | ORAL | Status: DC | PRN

## 2020-12-09 MED ORDER — GLIPIZIDE 5 MG PO TABS: 5.0000 mg | ORAL_TABLET | Freq: Every day | ORAL | Status: DC

## 2020-12-09 MED ORDER — SODIUM CHLORIDE 0.9 % IV SOLN
500.0000 mg | INTRAVENOUS | Status: DC
  Administered 2020-12-10 (×2): 500 mg via INTRAVENOUS
  Filled 2020-12-09 (×3): qty 500

## 2020-12-09 MED ORDER — PANTOPRAZOLE SODIUM 40 MG PO TBEC
40.0000 mg | DELAYED_RELEASE_TABLET | Freq: Every day | ORAL | Status: DC
  Administered 2020-12-10 – 2020-12-11 (×2): 40 mg via ORAL
  Filled 2020-12-09 (×2): qty 1

## 2020-12-09 MED ORDER — ROPINIROLE HCL 1 MG PO TABS
1.0000 mg | ORAL_TABLET | Freq: Every day | ORAL | Status: DC
  Filled 2020-12-09: qty 1

## 2020-12-09 MED ORDER — SODIUM CHLORIDE 0.9 % IV SOLN
2.0000 g | Freq: Once | INTRAVENOUS | Status: AC
  Administered 2020-12-09: 2 g via INTRAVENOUS
  Filled 2020-12-09: qty 2

## 2020-12-09 MED ORDER — LEVOFLOXACIN IN D5W 750 MG/150ML IV SOLN: 750.0000 mg | INTRAVENOUS | Status: DC

## 2020-12-09 MED ORDER — FERROUS SULFATE 325 (65 FE) MG PO TABS
325.0000 mg | ORAL_TABLET | Freq: Two times a day (BID) | ORAL | Status: DC
  Administered 2020-12-10 – 2020-12-11 (×2): 325 mg via ORAL
  Filled 2020-12-09 (×2): qty 1

## 2020-12-09 MED ORDER — SITAGLIP PHOS-METFORMIN HCL ER 50-1000 MG PO TB24: 1.0000 | ORAL_TABLET | Freq: Two times a day (BID) | ORAL | Status: DC

## 2020-12-09 MED ORDER — ONDANSETRON HCL 4 MG PO TABS: 4.0000 mg | ORAL_TABLET | Freq: Four times a day (QID) | ORAL | Status: DC | PRN

## 2020-12-09 MED ORDER — METRONIDAZOLE 500 MG/100ML IV SOLN
500.0000 mg | Freq: Once | INTRAVENOUS | Status: AC
  Administered 2020-12-09: 500 mg via INTRAVENOUS
  Filled 2020-12-09: qty 100

## 2020-12-09 MED ORDER — ACETAMINOPHEN 325 MG PO TABS: 650.0000 mg | ORAL_TABLET | Freq: Four times a day (QID) | ORAL | Status: DC | PRN

## 2020-12-09 MED ORDER — PREGABALIN 75 MG PO CAPS: 300.0000 mg | ORAL_CAPSULE | Freq: Every day | ORAL | Status: DC

## 2020-12-09 MED ORDER — TRAZODONE HCL 50 MG PO TABS
50.0000 mg | ORAL_TABLET | Freq: Every evening | ORAL | Status: DC | PRN
  Administered 2020-12-10: 50 mg via ORAL
  Filled 2020-12-09: qty 1

## 2020-12-09 MED ORDER — ATORVASTATIN CALCIUM 20 MG PO TABS
10.0000 mg | ORAL_TABLET | Freq: Every day | ORAL | Status: DC
  Administered 2020-12-10: 10 mg via ORAL
  Filled 2020-12-09: qty 1

## 2020-12-09 MED ORDER — ONDANSETRON HCL 4 MG/2ML IJ SOLN: 4.0000 mg | Freq: Four times a day (QID) | INTRAMUSCULAR | Status: DC | PRN

## 2020-12-09 MED ORDER — SODIUM CHLORIDE 0.9 % IV SOLN: 2.0000 g | Freq: Once | INTRAVENOUS | Status: DC

## 2020-12-09 MED ORDER — SODIUM CHLORIDE 0.9 % IV BOLUS (SEPSIS)
1000.0000 mL | Freq: Once | INTRAVENOUS | Status: AC
  Administered 2020-12-09: 1000 mL via INTRAVENOUS

## 2020-12-09 MED ORDER — AMLODIPINE BESYLATE 5 MG PO TABS
5.0000 mg | ORAL_TABLET | Freq: Every day | ORAL | Status: DC
  Administered 2020-12-11: 10:00:00 5 mg via ORAL
  Filled 2020-12-09: qty 1

## 2020-12-09 MED ORDER — ALBUTEROL SULFATE (2.5 MG/3ML) 0.083% IN NEBU: 2.5000 mg | INHALATION_SOLUTION | RESPIRATORY_TRACT | Status: DC | PRN

## 2020-12-09 MED ORDER — B COMPLEX PO TABS: 1.0000 | ORAL_TABLET | Freq: Every day | ORAL | Status: DC

## 2020-12-09 MED ORDER — IPRATROPIUM-ALBUTEROL 0.5-2.5 (3) MG/3ML IN SOLN
3.0000 mL | Freq: Four times a day (QID) | RESPIRATORY_TRACT | Status: DC
  Administered 2020-12-10 – 2020-12-11 (×5): 3 mL via RESPIRATORY_TRACT
  Filled 2020-12-09 (×6): qty 3

## 2020-12-09 MED ORDER — SODIUM CHLORIDE 0.9 % IV SOLN: INTRAVENOUS | Status: DC

## 2020-12-09 MED ORDER — LACTATED RINGERS IV SOLN: INTRAVENOUS | Status: AC

## 2020-12-09 MED ORDER — MAGNESIUM HYDROXIDE 400 MG/5ML PO SUSP
30.0000 mL | Freq: Every day | ORAL | Status: DC | PRN
  Filled 2020-12-09: qty 30

## 2020-12-09 MED ORDER — ALPRAZOLAM 0.5 MG PO TABS: 0.5000 mg | ORAL_TABLET | Freq: Two times a day (BID) | ORAL | Status: DC | PRN

## 2020-12-09 MED ORDER — SODIUM CHLORIDE 0.9 % IV SOLN
1.0000 g | INTRAVENOUS | Status: DC
  Administered 2020-12-10 – 2020-12-11 (×2): 1 g via INTRAVENOUS
  Filled 2020-12-09: qty 1
  Filled 2020-12-09 (×2): qty 10

## 2020-12-09 MED ORDER — VITAMIN D 25 MCG (1000 UNIT) PO TABS
2000.0000 [IU] | ORAL_TABLET | Freq: Every day | ORAL | Status: DC
  Administered 2020-12-10 – 2020-12-11 (×2): 2000 [IU] via ORAL
  Filled 2020-12-09 (×2): qty 2

## 2020-12-09 MED ORDER — GUAIFENESIN ER 600 MG PO TB12
600.0000 mg | ORAL_TABLET | Freq: Two times a day (BID) | ORAL | Status: DC
  Administered 2020-12-10 – 2020-12-11 (×3): 600 mg via ORAL
  Filled 2020-12-09 (×3): qty 1

## 2020-12-09 MED ORDER — VANCOMYCIN HCL IN DEXTROSE 1-5 GM/200ML-% IV SOLN
1000.0000 mg | Freq: Once | INTRAVENOUS | Status: AC
  Administered 2020-12-09: 1000 mg via INTRAVENOUS
  Filled 2020-12-09: qty 200

## 2020-12-09 NOTE — Sepsis Progress Note (Signed)
Following for sepsis monitoring ?

## 2020-12-09 NOTE — ED Provider Notes (Signed)
Emergency Medicine Provider Triage Evaluation Note  Suzanne Strong , a 75 y.o. female  was evaluated in triage.  Pt complains of fevers and generalized weakness, had to lower herself to the ground last night as she was unable to walk back to her room from the bathroom.  She denies hitting her head or losing consciousness.  She does have a long history of aspiration following tonsillar cancer and subsequent radiation with chemotherapy.  She states current symptoms are similar to prior episodes of aspiration and she was so weak that she was unable to get herself up off of the ground.  Review of Systems  Positive: Generalized weakness, cough, nausea, fever. Negative: Chest pain, shortness of breath, vomiting, abdominal pain, headache, neck pain, numbness, or weakness.  Physical Exam  BP 110/60 (BP Location: Left Arm)   Pulse (!) 107   Temp 98.3 F (36.8 C) (Oral)   Resp 19   Ht 5\' 6"  (1.676 m)   Wt 93.4 kg   SpO2 95%   BMI 33.25 kg/m  Gen:   Awake, no distress  Resp:  Normal effort, clear to auscultation bilaterally. MSK:   Moves extremities without difficulty, no upper or lower extremity bony tenderness to palpation. Other:  No abdominal tenderness, 2+ radial pulses bilaterally.  Medical Decision Making  Medically screening exam initiated at 7:28 PM.  Appropriate orders placed.  Suzanne Strong was informed that the remainder of the evaluation will be completed by another provider, this initial triage assessment does not replace that evaluation, and the importance of remaining in the ED until their evaluation is complete.  Patient is much weaker than her baseline and is at risk for recurrent aspiration.  We will check EKG, chest x-ray, labs, and UA.  No evidence of traumatic injury to her head or neck, she did spend a long time on the floor and we will check CK level for rhabdomyolysis.   Suzanne Divine, MD 12/09/20 [phone]

## 2020-12-09 NOTE — ED Notes (Signed)
Lab reports troponin 562; acuity level changed; charge nurse notified and will take pt to next available bed

## 2020-12-09 NOTE — Progress Notes (Signed)
Anticoagulation monitoring(Lovenox):  75 yo female ordered Lovenox 40 mg Q24h    Filed Weights   12/09/20 1810  Weight: 93.4 kg (206 lb)   BMI 33   Lab Results  Component Value Date   CREATININE 1.75 (H) 12/09/2020   CREATININE 0.87 11/26/2020   CREATININE 0.89 07/26/2020   Estimated Creatinine Clearance: 32.9 mL/min (A) (by C-G formula based on SCr of 1.75 mg/dL (H)). Hemoglobin & Hematocrit     Component Value Date/Time   HGB 12.7 12/09/2020 2003   HGB 11.5 (L) 03/01/2014 0846   HCT 39.0 12/09/2020 2003   HCT 36.4 03/01/2014 0846     Per Protocol for Patient with estCrcl > 30 ml/min and BMI > 30, will transition to Lovenox 45 mg Q24h.

## 2020-12-09 NOTE — Progress Notes (Signed)
CODE SEPSIS - PHARMACY COMMUNICATION  **Broad Spectrum Antibiotics should be administered within 1 hour of Sepsis diagnosis**  Time Code Sepsis Called/Page Received: 21:33  Antibiotics Ordered: Cefepime and Vancomycin  Time of 1st antibiotic administration: Cefepime given at 22:11  Additional action taken by pharmacy: n/a  If necessary, Name of Provider/Nurse Contacted: n/a    Vira Blanco ,PharmD Clinical Pharmacist  12/09/2020  10:20 PM

## 2020-12-09 NOTE — ED Provider Notes (Signed)
[facility] Emergency Department Provider Note  Time seen: 9:33 PM  I have reviewed the triage vital signs and the nursing notes.   HISTORY  Chief Complaint Fall   HPI Suzanne Strong is a 75 y.o. female with a past medical history of anemia, anxiety, diabetes, gastric reflux, hypertension, hyperlipidemia, past history of throat cancer s/p radiation presents to the emergency department for fever confusion and weakness.  According to the friend patient has a history of aspiration pneumonia twice this year so far given her difficulty swallowing following radiation treatment for her throat cancer.  States last night the patient called her to tell her that she was running a low-grade fever of 99, around 3:00 in the morning she was feeling unwell checked her temperature and had gone up to 101.8.  However patient states she began feeling extremely weak and was too weak to get back in bed and laid on the ground until the friend came over this afternoon after trying multiple times to call the patient without an answer around 2 PM.  Friend states the patient has become somewhat confused continues to have intermittent fevers cough at times. Past Medical History:  Diagnosis Date   Anemia    iron deficiency and B12 deficiency   Anxiety    Breast cancer ([place]) 02/25/2015   upper-outer quadrant of left female breast, Triple Negative. Neo-adjuvant chemo with complete pathologic response.    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    Wears dentures    partial bottom    Patient Active Problem List   Diagnosis Date Noted   Dysuria 07/17/2020   Dysphagia    Gastric erythema    Gastric polyp    Encounter for screening colonoscopy    Polyp of colon    History of aspiration pneumonia 02/15/2020   Diabetes mellitus without complication ([place])    GERD (gastroesophageal reflux disease)    Hypertension    Urinary urgency 01/05/2020   Grief reaction 09/18/2019   Swallowing dysfunction 07/18/2019   Cough 02/21/2019   Fever, intermittent 09/01/2018   Pre-syncope 02/07/2018   Orthostatic hypotension 01/26/2018   Episodic weakness 01/26/2018   Medicare annual wellness visit, subsequent 06/29/2017   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   Preoperative examination 04/27/2017   Carpal tunnel syndrome of right wrist 11/13/2016   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[place]) History of  02/17/2016   Obstructive sleep apnea 01/01/2016   Carcinoma of upper-outer quadrant of left breast in female, estrogen receptor negative ([place]) 11/21/2015   Hypomagnesemia 08/28/2015   Fever 04/11/2015   Initial Medicare annual wellness visit 10/16/2014   Estrogen deficiency 10/16/2014   Colon cancer screening 10/16/2014   Controlled type 2 diabetes mellitus without complication ([place]) 54/62/7035   RLS (restless legs syndrome) 02/21/2014   Chronic neck pain 07/25/2010   Depression with anxiety 07/07/2010   Routine general medical examination at a health care facility 06/26/2010   B12 deficiency 09/04/2009  Anemia, iron deficiency 08/29/2009   Anxiety state 08/26/2009   GASTROPARESIS 08/26/2009   Vitamin D deficiency 07/19/2008   Disorder of bone and cartilage 07/19/2007   Hyperlipidemia 07/18/2007   EDEMA 07/18/2007   Skin lesion, superficial 06/02/2007    Past Surgical History:  Procedure Laterality Date   APPENDECTOMY     BREAST BIOPSY Left 02/25/2015   INVASIVE MAMMARY CARCINOMA,Triple negative.   BREAST CYST ASPIRATION     BREAST  EXCISIONAL BIOPSY Left 12/2015   surgery   BREAST LUMPECTOMY WITH NEEDLE LOCALIZATION Left 10/02/2015   Procedure: BREAST LUMPECTOMY WITH NEEDLE LOCALIZATION;  Surgeon: [name], MD;  Location: [facility];  Service: General;  Laterality: Left;   BREAST LUMPECTOMY WITH SENTINEL LYMPH NODE BIOPSY Left 10/02/2015   Procedure: LEFT BREAST WIDE EXCISION WITH SENTINEL LYMPH NODE BX;  Surgeon: [name], MD;  Location: [facility];  Service: General;  Laterality: Left;   BREAST SURGERY     breast biopsy benign   CARPAL TUNNEL RELEASE Right 05/19/2017   Procedure: CARPAL TUNNEL RELEASE;  Surgeon: Earnestine Leys, MD;  Location: [facility];  Service: Orthopedics;  Laterality: Right;   CATARACT EXTRACTION W/PHACO Right 10/26/2018   Procedure: CATARACT EXTRACTION PHACO AND INTRAOCULAR LENS PLACEMENT ([place]) right diabetic;  Surgeon: Leandrew Koyanagi, MD;  Location: [place];  Service: Ophthalmology;  Laterality: Right;  Diabetic - oral meds cancer center been notified and will come   CATARACT EXTRACTION W/PHACO Left 11/16/2018   Procedure: CATARACT EXTRACTION PHACO AND INTRAOCULAR LENS PLACEMENT ([facility]) LEFT DIABETIC  01:01.0  17.0%  10.37;  Surgeon: Leandrew Koyanagi, MD;  Location: [place];  Service: Ophthalmology;  Laterality: Left;  Diabetic - oral meds Port-a-cath   CHOLECYSTECTOMY     Cologuard  07/22/2016   Negative   COLONOSCOPY WITH PROPOFOL N/A 05/24/2020   Procedure: COLONOSCOPY WITH PROPOFOL;  Surgeon: Virgel Manifold, MD;  Location: [facility];  Service: Endoscopy;  Laterality: N/A;   ESOPHAGOGASTRODUODENOSCOPY  07/2009   normal with few gastric polyps   ESOPHAGOGASTRODUODENOSCOPY (EGD) WITH PROPOFOL N/A 05/24/2020   Procedure: ESOPHAGOGASTRODUODENOSCOPY (EGD) WITH PROPOFOL;  Surgeon: Virgel Manifold, MD;  Location: [facility];  Service: Endoscopy;  Laterality: N/A;   MOLE REMOVAL     PORT-A-CATH REMOVAL     PORTACATH PLACEMENT     PORTACATH  PLACEMENT Right 03/12/2015   Procedure: INSERTION PORT-A-CATH;  Surgeon: [name], MD;  Location: [facility];  Service: General;  Laterality: Right;   RADICAL NECK DISSECTION     TONSILLECTOMY     cancer treated with chemo and radiation   TRIGGER FINGER RELEASE Right 05/19/2017   Procedure: RELEASE TRIGGER FINGER/A-1 PULLEY ring finger;  Surgeon: Earnestine Leys, MD;  Location: [facility];  Service: Orthopedics;  Laterality: Right;    Prior to Admission medications   Medication Sig Start Date End Date Taking? Authorizing Provider  acetaminophen (TYLENOL) 325 MG tablet Take 2 tablets (650 mg total) by mouth every 6 (six) hours as needed for mild pain (or Fever >/= 101). 05/30/20   Domenic Polite, MD  albuterol (VENTOLIN HFA) 108 (90 Base) MCG/ACT inhaler INHALE TWO PUFFS BY MOUTH EVERY SIX HOURS AS NEEDED 06/18/20   Tower, [name], MD  ALPRAZolam Duanne Moron) 0.5 MG tablet Take ONE tablet by MOUTH twice daily as needed FOR anxiety OR SLEEP 11/04/20   Thayer Headings, PMHNP  amitriptyline (ELAVIL) 50 MG tablet Take 1 tablet (50 mg total) by mouth at bedtime. 12/07/16   Cammie Sickle, MD  amLODipine (NORVASC) 5 MG tablet Take 1 tablet (5 mg total) by mouth daily. 06/18/20   Tower, [name], MD  atorvastatin (LIPITOR) 10 MG tablet TAKE 1 TABLET(10 MG) BY MOUTH DAILY 06/18/20   Tower, [name], MD  b complex vitamins tablet Take 1 tablet by mouth daily.    [provider]  busPIRone (BUSPAR) 15 MG tablet Take 1 tablet in the morning and 1/2 tablet in the evening for one week, then increase to 1 tablet twice daily 11/04/20   Thayer Headings, PMHNP  Cholecalciferol (VITAMIN D-1000 MAX ST) 25 MCG (1000 UT) tablet Take 2,000 Units by mouth daily. Takes three 50 mcg tablets, 3 capsules daily    [provider]  Emollient (COLLAGEN EX)     [provider]  ferrous sulfate 325 (65 FE) MG tablet Take 1 tablet (325 mg total) by mouth 2 (two) times daily with a meal. 05/30/20   Domenic Polite, MD  glipiZIDE (GLUCOTROL) 5 MG tablet Take 5 mg by mouth daily before breakfast.    [provider]  glucose blood (ONETOUCH ULTRA) test strip USE TO check blood glucose ONCE DAILY AND AS NEEDED FOR DIABETES 12/03/20   Tower, [name], MD  JANUMET XR 50-1000 MG TB24 Take 1 tablet by mouth 2 (two) times daily.  03/13/15   [provider]  lidocaine-prilocaine (EMLA) cream Apply 1 application topically as needed. Apply to port when going through Chemo    [provider]  MELATONIN PO Take 10 mg by mouth.    [provider]  Multiple Vitamins-Minerals (WOMENS MULTIVITAMIN PO) Take by mouth.    [provider]  omeprazole (PRILOSEC) 20 MG capsule TAKE ONE CAPSULE BY MOUTH EVERYDAY AT BEDTIME 06/18/20   Tower, Flora A, MD  pregabalin (LYRICA) 150 MG capsule Take 300 mg by mouth at bedtime. 09/12/20   [provider]  rOPINIRole (REQUIP) 1 MG tablet TAKE 1/2 TABLET BY MOUTH EVERY MORNING and TAKE THREE TABLETS BY MOUTH EVERYDAY AT BEDTIME 06/18/20   Tower, [name], MD  sertraline (ZOLOFT) 100 MG tablet Take 2 tablets (200 mg total) by mouth every morning. 11/04/20 02/02/21  Thayer Headings, PMHNP  Suvorexant (BELSOMRA) 20 MG TABS Take 20 mg by mouth at bedtime as needed. 11/04/20   Thayer Headings, PMHNP  traZODone (DESYREL) 50 MG tablet TAKE ONE TABLET BY MOUTH EVERYDAY AT BEDTIME 12/02/20   Thayer Headings, PMHNP    Allergies  Allergen Reactions   Amoxicillin Swelling    REACTION: rash, swelling Has patient had a PCN reaction causing immediate rash, facial/tongue/throat swelling, SOB or lightheadedness with hypotension: yes Has patient had a PCN reaction causing severe rash involving mucus membranes or skin necrosis: no Has patient had a PCN reaction that required hospitalization: no Has patient had a PCN reaction occurring within the last 10 years: yes If all of the above answers are "NO", then may proceed with Cephalosporin use.  Has  tolerated ceftriaxone   Penicillin G Anaphylaxis    Has tolerated ceftriaxone on multiple occasions    Fluconazole Rash    REACTION: hives   Difuleron [Ferrous Fumarate-Dss]    Other Other (See Comments)    Pt has had tonsil cancer (on Left side) - pt may have difficulty swallowing    Family History  Problem Relation Age of Onset   Osteoporosis Mother    Hyperlipidemia Mother    Depression Mother    Cancer Mother        skin cancer ?  basal cell and lung ca heavy smoker   Alcohol abuse Father    Cancer Father        skin CA ? basal cell   Heart disease Father        CHF   Hyperlipidemia Brother    Hypertension Brother    Breast cancer Neg Hx     Social History Social History   Tobacco Use   Smoking status: Former    Packs/day: 0.50    Years: 6.00    Pack years: 3.00    Types: Cigarettes    Quit date: 03/10/1984    Years since quitting: 36.7   Smokeless tobacco: Never  Vaping Use   Vaping Use: Never used  Substance Use Topics   Alcohol use: No    Alcohol/week: 0.0 standard drinks   Drug use: No    Review of Systems Constitutional: Fever to 101.8 overnight.  Positive for generalized weakness Cardiovascular: Negative for chest pain. Respiratory: Negative for shortness of breath.  Occasional cough Gastrointestinal: Negative for abdominal pain, vomiting and diarrhea. Musculoskeletal: Negative for musculoskeletal complaints Neurological: Negative for headache All other ROS negative  ____________________________________________   PHYSICAL EXAM:  VITAL SIGNS: ED Triage Vitals  Enc Vitals Group     BP 12/09/20 1810 110/60     Pulse Rate 12/09/20 1810 (!) 107     Resp 12/09/20 1810 19     Temp 12/09/20 1810 98.3 F (36.8 C)     Temp Source 12/09/20 1810 Oral     SpO2 12/09/20 1810 95 %     Weight 12/09/20 1810 206 lb (93.4 kg)     Height 12/09/20 1810 5\' 6"  (1.676 m)     Head Circumference --      Peak Flow --      Pain Score 12/09/20 1806 0     Pain  Loc --      Pain Edu? --      Excl. in [place]? --    Constitutional: Alert and oriented. Well appearing and in no distress. Eyes: Normal exam ENT      Head: Normocephalic and atraumatic.      Mouth/Throat: Mucous membranes are moist. Cardiovascular: Normal rate, regular rhythm.  Respiratory: Normal respiratory effort without tachypnea nor retractions. Breath sounds are clear  Gastrointestinal: Soft and nontender. No distention.   Musculoskeletal: Nontender with normal range of motion in all extremities.  Neurologic:  Normal speech and language. No gross focal neurologic deficits  Skin:  Skin is warm, dry and intact.  Psychiatric: Mood and affect are normal.   ____________________________________________    EKG  EKG viewed and interpreted by myself shows sinus tachycardia 110 bpm with a narrow QRS, normal axis, normal intervals, no concerning ST changes.  ____________________________________________    RADIOLOGY  Chest x-ray concerning for left lower lobe consolidation likely pneumonia  ____________________________________________   INITIAL IMPRESSION / ASSESSMENT AND PLAN / ED COURSE  Pertinent labs & imaging results that were available during my care of the patient were reviewed by me and considered in my medical decision making (see chart for details).   Patient presents emergency department for weakness fever as high as 101.8 cough.  Patient has a history of aspiration pneumonia due to difficulty swallowing status post radiation treatments to the throat.  Patient's work-up today is significant for mild tachycardia, leukocytosis of 20,000, chest x-ray consistent with left lower lobe pneumonia, troponin of 562.  Patient denies any chest pain.  Troponin on record review was elevated 6  months ago to around 200.  Creatinine elevated 1.75 consistent with acute kidney injury compared to her baseline.  Given the patient's confusion and weakness leukocytosis tachycardia consistent with  sepsis and chest x-ray consistent with left lower lobe pneumonia we will start on broad-spectrum antibiotics, send cultures, dose IV fluids.  We will plan to admit to the hospital service for further work-up and treatment.  Patient agreeable to plan of care.  CK is elevated greater than 6000 consistent with rhabdomyolysis which could also explain troponin elevation.  Given the patient's sepsis, left lower lobe pneumonia and rhabdomyolysis patient will be admitted to the hospital service for further treatment and work-up.  Suzanne Strong was evaluated in Emergency Department on 12/09/2020 for the symptoms described in the history of present illness. She was evaluated in the context of the global COVID-19 pandemic, which necessitated consideration that the patient might be at risk for infection with the SARS-CoV-2 virus that causes COVID-19. Institutional protocols and algorithms that pertain to the evaluation of patients at risk for COVID-19 are in a state of rapid change based on information released by regulatory bodies including the CDC and federal and state organizations. These policies and algorithms were followed during the patient's care in the ED.  CRITICAL CARE Performed by: Harvest Dark   Total critical care time: 30 minutes  Critical care time was exclusive of separately billable procedures and treating other patients.  Critical care was necessary to treat or prevent imminent or life-threatening deterioration.  Critical care was time spent personally by me on the following activities: development of treatment plan with patient and/or surrogate as well as nursing, discussions with consultants, evaluation of patient's response to treatment, examination of patient, obtaining history from patient or surrogate, ordering and performing treatments and interventions, ordering and review of laboratory studies, ordering and review of radiographic studies, pulse oximetry and re-evaluation of  patient's condition.  ____________________________________________   FINAL CLINICAL IMPRESSION(S) / ED DIAGNOSES  Pneumonia Sepsis Elevated troponin Rhabdomyolysis   Harvest Dark, MD 12/09/20 2207

## 2020-12-09 NOTE — Progress Notes (Signed)
PHARMACY -  BRIEF ANTIBIOTIC NOTE   Pharmacy has received consult(s) for Aztreonam and Vancomycin from an ED provider.  The patient's profile has been reviewed for ht/wt/allergies/indication/available labs.    One time order(s) placed for Cefepime 2g IV (patient has tolerated cephalosporins several times in the past) and Vancomycin 1g  Further antibiotics/pharmacy consults should be ordered by admitting physician if indicated.                       Thank you, Vira Blanco 12/09/2020  9:43 PM

## 2020-12-09 NOTE — H&P (Signed)
[place]   PATIENT NAME: Suzanne Strong    MR#:  [id number]  DATE OF BIRTH:  1945/05/08  DATE OF ADMISSION:  12/09/2020  PRIMARY CARE PHYSICIAN: Tower, [name], MD   Patient is coming from: Home  REQUESTING/REFERRING PHYSICIAN: Harvest Dark, MD  CHIEF COMPLAINT:   Chief Complaint  Patient presents with   Fall    HISTORY OF PRESENT ILLNESS:  Suzanne Strong is a 75 y.o. Caucasian female with medical history significant for type 2 diabetes mellitus, GERD, hypertension, dyslipidemia, peripheral neuropathy, remote throat cancer status post radiotherapy and restless leg syndrome, who presented to the ER with acute onset of fever and associated chills.  She has been feeling significantly weak for the last few days.  She called her friend last night to tell her that she was running low-grade fever around TM in the morning and that she felt unwell especially when it went up to 101.8.  She was in the bathroom and then and told her that she was feeling too weak to go back and bad.  The patient laid on the ground until her friend came at 2 PM after trying to call her several times without answer.  Her friend noticed that she was confused somewhat.  The patient had a reported nausea and vomiting.  She admits to cough with expiratory wheezing and dyspnea.  No chest pain or palpitations.  No dysuria, oliguria or hematuria or flank pain.  No diarrhea or melena or bright red bleeding per rectum.  ED Course: When she came to the ER heart rate was 107 and vital signs were within normal.  Later respiratory rate was 36.  Pulse oximetry was 91% on 2 L of O2 by nasal cannula.  Labs revealed a BUN of 38 and creatinine 1.75 above previous levels this month and AST of 137 and albumin 3.4 with total bili 1.3.  CK was 6349 and troponin I 562 and later 574.  Lactic acid was 3.1.  CBC showed leukocytosis with 20.6 with neutrophilia UA is currently pending.  Influenza antigens and COVID-19 PCR came  back negative.  INR was 1.3 and PT 16.2.  Blood cultures were drawn.  Imaging: 2 view chest x-ray showed left lower lobe consolidation concerning for pneumonia.  The patient was given IV cefepime, vancomycin and Flagyl for broad-spectrum coverage initially, 1 L bolus of IV l normal saline followed 150 mill per hour of IV lactated Ringer.  She will be admitted to a medical monitored bed for further evaluation and management. PAST MEDICAL HISTORY:   Past Medical History:  Diagnosis Date   Anemia    iron deficiency and B12 deficiency   Anxiety    Breast cancer ([place]) 02/25/2015   upper-outer quadrant of left female breast, Triple Negative. Neo-adjuvant chemo with complete pathologic response.    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   BREAST CYST ASPIRATION     BREAST EXCISIONAL BIOPSY Left 12/2015   surgery   BREAST LUMPECTOMY WITH NEEDLE LOCALIZATION Left 10/02/2015   Procedure: BREAST LUMPECTOMY WITH NEEDLE LOCALIZATION;  Surgeon: [name], MD;  Location: [facility];  Service: General;  Laterality: Left;   BREAST LUMPECTOMY WITH SENTINEL LYMPH NODE BIOPSY Left 10/02/2015   Procedure: LEFT BREAST WIDE EXCISION WITH SENTINEL LYMPH NODE BX;  Surgeon: [name], MD;  Location: [facility];  Service: General;  Laterality:  Left;   BREAST SURGERY     breast biopsy benign   CARPAL TUNNEL RELEASE Right 05/19/2017   Procedure: CARPAL TUNNEL RELEASE;  Surgeon: Earnestine Leys, MD;  Location: [facility];  Service: Orthopedics;  Laterality: Right;   CATARACT EXTRACTION W/PHACO Right 10/26/2018   Procedure: CATARACT EXTRACTION PHACO AND INTRAOCULAR LENS PLACEMENT ([place]) right diabetic;  Surgeon: Leandrew Koyanagi, MD;  Location: [place];  Service: Ophthalmology;  Laterality: Right;  Diabetic - oral meds cancer center been notified and will come   CATARACT EXTRACTION W/PHACO Left 11/16/2018   Procedure: CATARACT EXTRACTION PHACO AND INTRAOCULAR LENS PLACEMENT (I[facility]) LEFT DIABETIC  01:01.0  17.0%  10.37;  Surgeon: Leandrew Koyanagi, MD;  Location: [facility];  Service: Ophthalmology;  Laterality: Left;  Diabetic - oral meds Port-a-cath   CHOLECYSTECTOMY     Cologuard  07/22/2016   Negative   COLONOSCOPY WITH PROPOFOL N/A 05/24/2020   Procedure: COLONOSCOPY WITH PROPOFOL;  Surgeon: Virgel Manifold, MD;  Location: [facility];  Service: Endoscopy;  Laterality: N/A;   ESOPHAGOGASTRODUODENOSCOPY  07/2009   normal with few gastric polyps   ESOPHAGOGASTRODUODENOSCOPY (EGD) WITH PROPOFOL N/A 05/24/2020   Procedure: ESOPHAGOGASTRODUODENOSCOPY (EGD) WITH PROPOFOL;  Surgeon: Virgel Manifold, MD;  Location: [facility];  Service: Endoscopy;  Laterality: N/A;   MOLE REMOVAL     PORT-A-CATH REMOVAL     PORTACATH PLACEMENT     PORTACATH PLACEMENT Right 03/12/2015   Procedure: INSERTION PORT-A-CATH;  Surgeon: [name], MD;  Location: [facility];  Service: General;  Laterality: Right;   RADICAL NECK DISSECTION     TONSILLECTOMY     cancer treated with chemo and radiation   TRIGGER FINGER RELEASE Right 05/19/2017   Procedure: RELEASE TRIGGER FINGER/A-1 PULLEY ring finger;  Surgeon: Earnestine Leys, MD;  Location: [facility];  Service: Orthopedics;  Laterality: Right;    SOCIAL HISTORY:   Social  History   Tobacco Use   Smoking status: Former    Packs/day: 0.50    Years: 6.00    Pack years: 3.00    Types: Cigarettes    Quit date: 03/10/1984    Years since quitting: 36.7   Smokeless tobacco: Never  Substance Use Topics   Alcohol use: No    Alcohol/week: 0.0 standard drinks    FAMILY HISTORY:   Family History  Problem Relation Age of Onset   Osteoporosis Mother    Hyperlipidemia Mother    Depression Mother    Cancer Mother        skin cancer ? basal cell and lung ca heavy smoker   Alcohol abuse Father    Cancer Father        skin CA ? basal cell   Heart disease Father        CHF   Hyperlipidemia Brother  Hypertension Brother    Breast cancer Neg Hx     DRUG ALLERGIES:   Allergies  Allergen Reactions   Amoxicillin Swelling    REACTION: rash, swelling Has patient had a PCN reaction causing immediate rash, facial/tongue/throat swelling, SOB or lightheadedness with hypotension: yes Has patient had a PCN reaction causing severe rash involving mucus membranes or skin necrosis: no Has patient had a PCN reaction that required hospitalization: no Has patient had a PCN reaction occurring within the last 10 years: yes If all of the above answers are "NO", then may proceed with Cephalosporin use.  Has tolerated ceftriaxone   Penicillin G Anaphylaxis    Has tolerated ceftriaxone on multiple occasions    Fluconazole Rash    REACTION: hives   Difuleron [Ferrous Fumarate-Dss]    Other Other (See Comments)    Pt has had tonsil cancer (on Left side) - pt may have difficulty swallowing    REVIEW OF SYSTEMS:   ROS As per history of present illness. All pertinent systems were reviewed above. Constitutional, HEENT, cardiovascular, respiratory, GI, GU, musculoskeletal, neuro, psychiatric, endocrine, integumentary and hematologic systems were reviewed and are otherwise negative/unremarkable except for positive findings mentioned above in the HPI.   MEDICATIONS AT HOME:    Prior to Admission medications   Medication Sig Start Date End Date Taking? Authorizing Provider  acetaminophen (TYLENOL) 325 MG tablet Take 2 tablets (650 mg total) by mouth every 6 (six) hours as needed for mild pain (or Fever >/= 101). 05/30/20   Domenic Polite, MD  albuterol (VENTOLIN HFA) 108 (90 Base) MCG/ACT inhaler INHALE TWO PUFFS BY MOUTH EVERY SIX HOURS AS NEEDED 06/18/20   Tower, [name], MD  ALPRAZolam Duanne Moron) 0.5 MG tablet Take ONE tablet by MOUTH twice daily as needed FOR anxiety OR SLEEP 11/04/20   Thayer Headings, PMHNP  amitriptyline (ELAVIL) 50 MG tablet Take 1 tablet (50 mg total) by mouth at bedtime. 12/07/16   Cammie Sickle, MD  amLODipine (NORVASC) 5 MG tablet Take 1 tablet (5 mg total) by mouth daily. 06/18/20   Tower, [name], MD  atorvastatin (LIPITOR) 10 MG tablet TAKE 1 TABLET(10 MG) BY MOUTH DAILY 06/18/20   Tower, [name], MD  b complex vitamins tablet Take 1 tablet by mouth daily.    [provider]  busPIRone (BUSPAR) 15 MG tablet Take 1 tablet in the morning and 1/2 tablet in the evening for one week, then increase to 1 tablet twice daily 11/04/20   Thayer Headings, PMHNP  Cholecalciferol (VITAMIN D-1000 MAX ST) 25 MCG (1000 UT) tablet Take 2,000 Units by mouth daily. Takes three 50 mcg tablets, 3 capsules daily    [provider]  Emollient (COLLAGEN EX)     [provider]  ferrous sulfate 325 (65 FE) MG tablet Take 1 tablet (325 mg total) by mouth 2 (two) times daily with a meal. 05/30/20   Domenic Polite, MD  glipiZIDE (GLUCOTROL) 5 MG tablet Take 5 mg by mouth daily before breakfast.    [provider]  glucose blood (ONETOUCH ULTRA) test strip USE TO check blood glucose ONCE DAILY AND AS NEEDED FOR DIABETES 12/03/20   Tower, [name], MD  JANUMET XR 50-1000 MG TB24 Take 1 tablet by mouth 2 (two) times daily.  03/13/15   [provider]  lidocaine-prilocaine (EMLA) cream Apply 1 application topically as needed. Apply  to port when going through Chemo    [provider]  MELATONIN PO Take 10 mg  by mouth.    [provider]  Multiple Vitamins-Minerals (WOMENS MULTIVITAMIN PO) Take by mouth.    [provider]  omeprazole (PRILOSEC) 20 MG capsule TAKE ONE CAPSULE BY MOUTH EVERYDAY AT BEDTIME 06/18/20   Tower, Newald A, MD  pregabalin (LYRICA) 150 MG capsule Take 300 mg by mouth at bedtime. 09/12/20   [provider]  rOPINIRole (REQUIP) 1 MG tablet TAKE 1/2 TABLET BY MOUTH EVERY MORNING and TAKE THREE TABLETS BY MOUTH EVERYDAY AT BEDTIME 06/18/20   Tower, [name], MD  sertraline (ZOLOFT) 100 MG tablet Take 2 tablets (200 mg total) by mouth every morning. 11/04/20 02/02/21  Thayer Headings, PMHNP  Suvorexant (BELSOMRA) 20 MG TABS Take 20 mg by mouth at bedtime as needed. 11/04/20   Thayer Headings, PMHNP  traZODone (DESYREL) 50 MG tablet TAKE ONE TABLET BY MOUTH EVERYDAY AT BEDTIME 12/02/20   Thayer Headings, PMHNP      VITAL SIGNS:  Blood pressure (!) 108/59, pulse (!) 101, temperature 98.6 F (37 C), temperature source Oral, resp. rate (!) 36, height 5\' 6"  (1.676 m), weight 93.4 kg, SpO2 91 %.  PHYSICAL EXAMINATION:  Physical Exam  GENERAL:  75 y.o.-year-old Caucasian female patient lying in the bed with mild conversational dyspnea.  EYES: Pupils equal, round, reactive to light and accommodation. No scleral icterus. Extraocular muscles intact.  HEENT: Head atraumatic, normocephalic. Oropharynx and nasopharynx clear.  NECK:  Supple, no jugular venous distention. No thyroid enlargement, no tenderness.  LUNGS: The patient has diminished bibasal breath sounds with left basal crackles.  CARDIOVASCULAR: Regular rate and rhythm, S1, S2 normal. No murmurs, rubs, or gallops.  ABDOMEN: Soft, nondistended, nontender. Bowel sounds present. No organomegaly or mass.  EXTREMITIES: No pedal edema, cyanosis, or clubbing.  NEUROLOGIC: Cranial nerves II through XII are intact. Muscle strength 5/5  in all extremities. Sensation intact. Gait not checked.  PSYCHIATRIC: The patient is alert and oriented x 3.  Normal affect and good eye contact. SKIN: No obvious rash, lesion, or ulcer.   LABORATORY PANEL:   CBC Recent Labs  Lab 12/09/20 2003  WBC 20.6*  HGB 12.7  HCT 39.0  PLT 213   ------------------------------------------------------------------------------------------------------------------  Chemistries  Recent Labs  Lab 12/09/20 2003  NA 136  K 4.5  CL 100  CO2 23  GLUCOSE 152*  BUN 30*  CREATININE 1.75*  CALCIUM 8.5*  AST 137*  ALT 30  ALKPHOS 48  BILITOT 1.3*   ------------------------------------------------------------------------------------------------------------------  Cardiac Enzymes No results for input(s): TROPONINI in the last 168 hours. ------------------------------------------------------------------------------------------------------------------  RADIOLOGY:  DG Chest 2 View  Result Date: 12/09/2020 CLINICAL DATA:  Weakness. EXAM: CHEST - 2 VIEW COMPARISON:  11/12/2020 FINDINGS: Consolidation in the left lower lobe compatible with pneumonia. No confluent opacity on the right. Heart is borderline in size. Aortic atherosclerosis. No acute bony abnormality. IMPRESSION: Left lower lobe consolidation concerning for pneumonia. Electronically Signed   By: Rolm Baptise M.D.   On: 12/09/2020 20:56      IMPRESSION AND PLAN:  Active Problems:   Aspiration pneumonia ([place])  1.  Left lower lobe [facility] pneumonia concerning for aspiration pneumonia. - The patient will be admitted to a medical monitored bed. - We will continue antibiotic therapy with IV Levaquin given previous anaphylaxis with penicillin. - Mucolytic therapy will be provided. - We will place her on bronchodilator therapy. - We will follow blood and sputum culture. - Speech therapy consult will be obtained  2.  Sepsis secondary to pneumonia.  This is manifested  by tachycardia,  tachypnea and fever with remarkable leukocytosis. - The patient be hydrated with IV normal saline. - We will follow blood and sputum cultures mentioned above. - We will check her UA. - Antibiotic therapy will be given with IV Levaquin.  3.  Acute rhabdomyolysis with elevated CK and troponin. - We will place on hydration normal saline and follow her muscle enzymes  4.  Essential hypertension. - We will continue Norvasc.  5.  Dyslipidemia. - We will continue statin therapy.  6.  Anxiety. - We will continue BuSpar.  7.  Type 2 diabetes mellitus. - The patient will be placed on supplement coverage with NovoLog. - We will continue glipizide and hold of Janumet XR.  8.  GERD. - PPI therapy will be resumed.   DVT prophylaxis: Lovenox. Code Status: full code. Family Communication:  The plan of care was discussed in details with the patient (and family). I answered all questions. The patient agreed to proceed with the above mentioned plan. Further management will depend upon hospital course. Disposition Plan: Back to previous home environment Consults called: none. All the records are reviewed and case discussed with ED provider.  Status is: Inpatient   Remains inpatient appropriate because:Ongoing diagnostic testing needed not appropriate for outpatient work up, Unsafe d/c plan, IV treatments appropriate due to intensity of illness or inability to take PO, and Inpatient level of care appropriate due to severity of illness   Dispo: The patient is from: Home              Anticipated d/c is to: Home              Patient currently is not medically stable to d/c.              Difficult to place patient: No  TOTAL TIME TAKING CARE OF THIS PATIENT: 55 minutes.     Christel Mormon M.D on 12/09/2020 at 10:33 PM  [facility]   From 7 PM-7 AM, contact night-coverage [email or web address]  CC: Primary care physician; Tower, [name], MD

## 2020-12-09 NOTE — ED Triage Notes (Addendum)
Pt comes via EMs with c/o fall. Pt denies any complaints but does want to be checked out. VSS per EMs  Pt denies any pain.

## 2020-12-10 ENCOUNTER — Encounter: Payer: Self-pay | Admitting: Family Medicine

## 2020-12-10 DIAGNOSIS — M6282 Rhabdomyolysis: Secondary | ICD-10-CM | POA: Diagnosis not present

## 2020-12-10 DIAGNOSIS — E872 Acidosis, unspecified: Secondary | ICD-10-CM | POA: Diagnosis not present

## 2020-12-10 DIAGNOSIS — J690 Pneumonitis due to inhalation of food and vomit: Secondary | ICD-10-CM | POA: Diagnosis not present

## 2020-12-10 DIAGNOSIS — J9601 Acute respiratory failure with hypoxia: Secondary | ICD-10-CM

## 2020-12-10 LAB — CBC
HCT: 38.2 % (ref 36.0–46.0)
Hemoglobin: 12.2 g/dL (ref 12.0–15.0)
MCH: 30.1 pg (ref 26.0–34.0)
MCHC: 31.9 g/dL (ref 30.0–36.0)
MCV: 94.3 fL (ref 80.0–100.0)
Platelets: 160 10*3/uL (ref 150–400)
RBC: 4.05 MIL/uL (ref 3.87–5.11)
RDW: 14.6 % (ref 11.5–15.5)
WBC: 15.7 10*3/uL — ABNORMAL HIGH (ref 4.0–10.5)
nRBC: 0 % (ref 0.0–0.2)

## 2020-12-10 LAB — URINALYSIS, COMPLETE (UACMP) WITH MICROSCOPIC
Bilirubin Urine: NEGATIVE
Glucose, UA: NEGATIVE mg/dL
Ketones, ur: 5 mg/dL — AB
Nitrite: NEGATIVE
Protein, ur: 30 mg/dL — AB
Specific Gravity, Urine: 1.017 (ref 1.005–1.030)
pH: 5 (ref 5.0–8.0)

## 2020-12-10 LAB — HEPATIC FUNCTION PANEL
ALT: 35 U/L (ref 0–44)
AST: 153 U/L — ABNORMAL HIGH (ref 15–41)
Albumin: 2.7 g/dL — ABNORMAL LOW (ref 3.5–5.0)
Alkaline Phosphatase: 43 U/L (ref 38–126)
Bilirubin, Direct: 0.2 mg/dL (ref 0.0–0.2)
Indirect Bilirubin: 0.8 mg/dL (ref 0.3–0.9)
Total Bilirubin: 1 mg/dL (ref 0.3–1.2)
Total Protein: 6.3 g/dL — ABNORMAL LOW (ref 6.5–8.1)

## 2020-12-10 LAB — BASIC METABOLIC PANEL
Anion gap: 12 (ref 5–15)
BUN: 32 mg/dL — ABNORMAL HIGH (ref 8–23)
CO2: 21 mmol/L — ABNORMAL LOW (ref 22–32)
Calcium: 7.8 mg/dL — ABNORMAL LOW (ref 8.9–10.3)
Chloride: 102 mmol/L (ref 98–111)
Creatinine, Ser: 1.6 mg/dL — ABNORMAL HIGH (ref 0.44–1.00)
GFR, Estimated: 34 mL/min — ABNORMAL LOW (ref >60)
Glucose, Bld: 128 mg/dL — ABNORMAL HIGH (ref 70–99)
Potassium: 4.5 mmol/L (ref 3.5–5.1)
Sodium: 135 mmol/L (ref 135–145)

## 2020-12-10 LAB — GLUCOSE, CAPILLARY: Glucose-Capillary: 75 mg/dL (ref 70–99)

## 2020-12-10 LAB — LACTIC ACID, PLASMA
Lactic Acid, Venous: 4.1 mmol/L (ref 0.5–1.9)
Lactic Acid, Venous: 2.2 mmol/L (ref 0.5–1.9)
Lactic Acid, Venous: 2.5 mmol/L (ref 0.5–1.9)
Lactic Acid, Venous: 2.1 mmol/L (ref 0.5–1.9)

## 2020-12-10 LAB — TROPONIN I (HIGH SENSITIVITY)
Troponin I (High Sensitivity): 389 ng/L (ref <18)
Troponin I (High Sensitivity): 288 ng/L (ref <18)

## 2020-12-10 LAB — CK: Total CK: 3940 U/L — ABNORMAL HIGH (ref 38–234)

## 2020-12-10 LAB — PROCALCITONIN: Procalcitonin: 16.2 ng/mL

## 2020-12-10 MED ORDER — INSULIN ASPART 100 UNIT/ML IJ SOLN
0.0000 [IU] | Freq: Three times a day (TID) | INTRAMUSCULAR | Status: DC
  Filled 2020-12-10: qty 1

## 2020-12-10 MED ORDER — LINAGLIPTIN 5 MG PO TABS
5.0000 mg | ORAL_TABLET | Freq: Every day | ORAL | Status: DC
  Administered 2020-12-10 – 2020-12-11 (×2): 5 mg via ORAL
  Filled 2020-12-10 (×2): qty 1

## 2020-12-10 MED ORDER — PREGABALIN 75 MG PO CAPS
150.0000 mg | ORAL_CAPSULE | Freq: Every day | ORAL | Status: DC
  Administered 2020-12-10: 21:00:00 150 mg via ORAL
  Filled 2020-12-10: qty 2

## 2020-12-10 MED ORDER — ROPINIROLE HCL 1 MG PO TABS
0.5000 mg | ORAL_TABLET | Freq: Every day | ORAL | Status: DC
  Administered 2020-12-10 – 2020-12-11 (×2): 0.5 mg via ORAL
  Filled 2020-12-10: qty 1
  Filled 2020-12-10: qty 2

## 2020-12-10 MED ORDER — METFORMIN HCL 500 MG PO TABS: 1000.0000 mg | ORAL_TABLET | Freq: Two times a day (BID) | ORAL | Status: DC

## 2020-12-10 MED ORDER — ROPINIROLE HCL 1 MG PO TABS
3.0000 mg | ORAL_TABLET | Freq: Every day | ORAL | Status: DC
  Administered 2020-12-10: 3 mg via ORAL
  Filled 2020-12-10 (×2): qty 3

## 2020-12-10 NOTE — Evaluation (Signed)
Clinical/Bedside Swallow Evaluation Patient Details  Name: Suzanne Strong MRN: [id number] Date of Birth: [birth year]  Today's Date: 12/10/2020 Time: SLP Start Time (ACUTE ONLY): 0840 SLP Stop Time (ACUTE ONLY): 0945 SLP Time Calculation (min) (ACUTE ONLY): 65 min  Past Medical History:  Past Medical History:  Diagnosis Date   Anemia    iron deficiency and B12 deficiency   Anxiety    Breast cancer ([place]) 02/25/2015   upper-outer quadrant of left female breast, Triple Negative. Neo-adjuvant chemo with complete pathologic response.    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   BREAST CYST ASPIRATION     BREAST EXCISIONAL BIOPSY Left 12/2015   surgery   BREAST LUMPECTOMY WITH NEEDLE LOCALIZATION Left 10/02/2015   Procedure: BREAST LUMPECTOMY WITH NEEDLE LOCALIZATION;  Surgeon: [name], MD;  Location: [facility];  Service: General;  Laterality: Left;   BREAST LUMPECTOMY WITH SENTINEL LYMPH NODE BIOPSY Left 10/02/2015   Procedure: LEFT BREAST WIDE EXCISION WITH SENTINEL LYMPH NODE BX;  Surgeon: [name], MD;  Location: [facility];  Service: General;  Laterality: Left;   BREAST SURGERY     breast biopsy benign   CARPAL TUNNEL RELEASE Right 05/19/2017   Procedure: CARPAL TUNNEL RELEASE;  Surgeon: Earnestine Leys, MD;   Location: [facility];  Service: Orthopedics;  Laterality: Right;   CATARACT EXTRACTION W/PHACO Right 10/26/2018   Procedure: CATARACT EXTRACTION PHACO AND INTRAOCULAR LENS PLACEMENT ([place]) right diabetic;  Surgeon: Leandrew Koyanagi, MD;  Location: [facility];  Service: Ophthalmology;  Laterality: Right;  Diabetic - oral meds cancer center been notified and will come   CATARACT EXTRACTION W/PHACO Left 11/16/2018   Procedure: CATARACT EXTRACTION PHACO AND INTRAOCULAR LENS PLACEMENT ([facility]) LEFT DIABETIC  01:01.0  17.0%  10.37;  Surgeon: Leandrew Koyanagi, MD;  Location: [facility];  Service: Ophthalmology;  Laterality: Left;  Diabetic - oral meds Port-a-cath   CHOLECYSTECTOMY     Cologuard  07/22/2016   Negative   COLONOSCOPY WITH PROPOFOL N/A 05/24/2020   Procedure: COLONOSCOPY WITH PROPOFOL;  Surgeon: Virgel Manifold, MD;  Location: [facility];  Service: Endoscopy;  Laterality: N/A;   ESOPHAGOGASTRODUODENOSCOPY  07/2009   normal with few gastric polyps   ESOPHAGOGASTRODUODENOSCOPY (EGD) WITH PROPOFOL N/A 05/24/2020   Procedure: ESOPHAGOGASTRODUODENOSCOPY (EGD) WITH PROPOFOL;  Surgeon: Virgel Manifold, MD;  Location: [facility];  Service: Endoscopy;  Laterality: N/A;   MOLE REMOVAL     PORT-A-CATH REMOVAL     PORTACATH PLACEMENT     PORTACATH PLACEMENT Right 03/12/2015   Procedure: INSERTION PORT-A-CATH;  Surgeon: [name], MD;  Location: [facility];  Service: General;  Laterality: Right;   RADICAL NECK DISSECTION     TONSILLECTOMY  cancer treated with chemo and radiation   TRIGGER FINGER RELEASE Right 05/19/2017   Procedure: RELEASE TRIGGER FINGER/A-1 PULLEY ring finger;  Surgeon: Earnestine Leys, MD;  Location: [facility];  Service: Orthopedics;  Laterality: Right;   HPI:  Pt is a 75 y.o. Caucasian female with medical history significant for type 2 diabetes mellitus, GERD, hypertension, dyslipidemia, peripheral neuropathy, remote throat cancer status  post radiotherapy and restless leg syndrome, who presented to the ER with acute onset of fever and associated chills.  Pt complains of fevers and generalized weakness, had to lower herself to the ground last night as she was unable to walk back to her room from the bathroom.  She denies hitting her head or losing consciousness.  She does have a long history of aspiration following tonsillar cancer and subsequent radiation with chemotherapy.  She states current symptoms are similar to prior episodes of aspiration and she was so weak that she was unable to get herself up off of the ground.   CXR at admit: Left lower lobe consolidation concerning for pneumonia.  Recent CXRs in May, June, and Sept: Similar appearance of the chest x-ray with mild reticulonodular  opacities, potentially chronic changes/sequela of prior atypical  infection.  Patient is much weaker than her baseline and is at risk for recurrent aspiration per MD note.    Assessment / Plan / Recommendation  Clinical Impression  Pt has Chronic Dysphagia and Aspiration per History and Chart notes. MBSS in 01/2020: Patient presents with chronic moderate oropharyngeal and pharyngoesophageal dysphagia secondary to late effects of XRT/resection for tonsilar cancer ~10 years ago. Overall function appears somewhat improved compared with MBS in June 2021, which showed silent aspiration of thin and nectar thick liquids, intermittently. Patient did aspirate after the swallow with larger sips of thin and nectar, and when performing liquid wash of solid, however these aspiration events elicited a protective cough response. Aspiration risk is judged to be Moderate at this time, given active pneumonia. The safest diet at this time appears to be thin liquids and puree, and meds crushed in puree with use of compensations and strategies; diet to upgrade to solid foods when ready. Pt stated she tries to follow the swallowing strategies using a Super Supraglottic  swallowing and Cough/Throat clear post swallowing. Pt acknowledges she has Dysphagia. She requested Regular foods that she will MINCE herself. She drinks thin liquids via Cup following precautions. She is able to feed self w/ setup.   At this evaluation, overt clinical s/s of aspiration were noted w/ all trial consistencies c/b immediate and delayed Coughing -- congested coughing. Trials were: ice chips, thin liquids, purees, and Minced food. Pt utilized strategy of strong cough after each trials w/ min verbal reminder/cue; also instructed on the supraglottic swallow(breath hold during swallow) to aid airway protection during swallowing. OM exam revealed lingual and palatal deviation to the Left side; decreased lingual strength (this Baseline per pt).   Discussed pt's presentation of Dysphagia and the recommendations of swallowing strategies w/ her; education on aspiration precautions. She demonstrated good follow through w/ recommendations, HOWEVER, appears to present w/ HIGH risk for Aspiration d/t the degree of her pharyngeal phase Dysphagia. Strategies cannot be considered 100% effective. Suspect pt could be having ongoing changes/worsening of her Dysphagia dt/ the chronic nature of the Dysphagia. This will continue to present risk for Pulmonary impact and PNA.  Recommend f/u w/ Palliative Care consult for [place] discussion to include alternative means of feeding. Recommend frequent oral care for hygiene  PRIOR to ANY oral intake. MD updated re: above and recommendations. Will await Diet order from MD for pt. NSG updated. ST services will f/u w/ ongoing education on precautions/strategies while admitted.  SLP Visit Diagnosis: Dysphagia, pharyngeal phase (R13.13);Dysphagia, pharyngoesophageal phase (R13.14) (chroinc, baseline)    Aspiration Risk  Moderate aspiration risk;Severe aspiration risk;Risk for inadequate nutrition/hydration    Diet Recommendation  MINCED foods and thin liquids are baseline for pt  at home; precautions and swallowing strategies  Medication Administration: Crushed with puree (for safer swallowing)    Other  Recommendations Recommended Consults: Consider GI evaluation (Palliative Care f/u for [place]. Dietician f/u) Oral Care Recommendations: Oral care BID;Oral care before and after PO;Patient independent with oral care Other Recommendations:  (n/a)    Recommendations for follow up therapy are one component of a multi-disciplinary discharge planning process, led by the attending physician.  Recommendations may be updated based on patient status, additional functional criteria and insurance authorization.  Follow up Recommendations  (TBD)      Frequency and Duration min 2x/week  1 week       Prognosis Prognosis for Safe Diet Advancement: Guarded Barriers to Reach Goals: Time post onset;Severity of deficits Barriers/Prognosis Comment: baseline Dysphagia      Swallow Study   General Date of Onset: 12/09/20 HPI: Pt is a 75 y.o. Caucasian female with medical history significant for type 2 diabetes mellitus, GERD, hypertension, dyslipidemia, peripheral neuropathy, remote throat cancer status post radiotherapy and restless leg syndrome, who presented to the ER with acute onset of fever and associated chills.  Pt complains of fevers and generalized weakness, had to lower herself to the ground last night as she was unable to walk back to her room from the bathroom.  She denies hitting her head or losing consciousness.  She does have a long history of aspiration following tonsillar cancer and subsequent radiation with chemotherapy.  She states current symptoms are similar to prior episodes of aspiration and she was so weak that she was unable to get herself up off of the ground.   CXR at admit: Left lower lobe consolidation concerning for pneumonia.  Recent CXRs in May, June, and Sept: Similar appearance of the chest x-ray with mild reticulonodular  opacities, potentially chronic  changes/sequela of prior atypical  infection.  Patient is much weaker than her baseline and is at risk for recurrent aspiration per MD note. Type of Study: Bedside Swallow Evaluation Previous Swallow Assessment: MBSS in 01/2020: Patient presents with chronic moderate oropharyngeal and pharyngoesophageal dysphagia secondary to late effects of XRT/resection for tonsilar cancer ~10 years ago. Overall function appears somewhat improved compared with MBS in June 2021, which showed silent aspiration of thin and nectar thick liquids, intermittently. Patient did aspirate after the swallow with larger sips of thin and nectar, and when performing liquid wash of solid, however these aspiration events elicited a protective cough response. Aspiration risk is judged to be Moderate at this time, given active pneumonia. The safest diet at this time appears to be thin liquids and puree, and meds crushed in puree with use of compensations and strategies; diet to upgrade to solid foods when she is ready. Diet Prior to this Study: NPO (MINCED foods at home she stated) Temperature Spikes Noted: No (wbc 15.7) Respiratory Status: Nasal cannula (4L) History of Recent Intubation: No Behavior/Cognition: Alert;Cooperative;Pleasant mood Oral Cavity Assessment: Dry (xerostomia baseline) Oral Care Completed by SLP: Yes Oral Cavity - Dentition: Adequate natural dentition Vision: Functional for self-feeding Self-Feeding Abilities: Able to  feed self;Needs set up Patient Positioning: Upright in bed (support given) Baseline Vocal Quality:  (adequate; slightly gravely) Volitional Cough: Strong Volitional Swallow: Able to elicit    Oral/Motor/Sensory Function Overall Oral Motor/Sensory Function: Moderate impairment Facial ROM: Within Functional Limits Facial Symmetry: Within Functional Limits Facial Strength: Within Functional Limits Lingual ROM: Within Functional Limits Lingual Symmetry: Abnormal symmetry left;Suspected CN XII  (hypoglossal) dysfunction Lingual Strength: Reduced;Suspected CN XII (hypoglossal) dysfunction Lingual Sensation: Within Functional Limits Velum: Impaired left;Suspected CN X (Vagus) dysfunction Mandible: Within Functional Limits   Ice Chips Ice chips: Impaired Presentation: Spoon (fed; 3 trials) Oral Phase Impairments:  (none) Pharyngeal Phase Impairments: Throat Clearing - Delayed   Thin Liquid Thin Liquid: Impaired Presentation: Cup;Self Fed (8 trials) Oral Phase Impairments:  (none) Pharyngeal  Phase Impairments: Suspected delayed Swallow;Cough - Immediate;Cough - Delayed    Nectar Thick Nectar Thick Liquid: Not tested   Honey Thick Honey Thick Liquid: Not tested   Puree Puree: Impaired Presentation: Self Fed;Spoon (3 trials) Oral Phase Impairments:  (none) Pharyngeal Phase Impairments: Throat Clearing - Delayed;Cough - Delayed;Cough - Immediate (pharyngeal phase deficits baseline)   Solid     Solid: Impaired Presentation: Spoon;Self Fed (3 trials) Oral Phase Impairments:  (none) Pharyngeal Phase Impairments: Throat Clearing - Delayed;Cough - Immediate;Cough - Delayed (pharyngeal phase deficits baseline)        Orinda Kenner, MS, CCC-SLP Speech Language Pathologist Rehab Services [phone] Shawnice Tilmon 12/10/2020,11:00 AM

## 2020-12-10 NOTE — ED Notes (Signed)
Pharmacy tech at bedside 

## 2020-12-10 NOTE — Sepsis Progress Note (Signed)
Notified bedside nurse of need to draw repeat lactic acid. 

## 2020-12-10 NOTE — ED Notes (Signed)
Patient repositioned in bed. Lunch tray at bedside.

## 2020-12-10 NOTE — ED Notes (Signed)
Patient sleeping at this time. NAD noted. Respirations even and unlabored. 

## 2020-12-10 NOTE — Sepsis Progress Note (Addendum)
Notified Dr [name] Ace of Lactic acid 4.1. Appropriate sepsis measures taken and repeat lactic acid ordered.

## 2020-12-10 NOTE — Evaluation (Signed)
Physical Therapy Evaluation Patient Details Name: Suzanne Strong MRN: [id number] DOB: April 17, 1945 Today's Date: 12/10/2020  History of Present Illness  Patient is a 75 y.o. female with medical history significant of hypertension, hyperlipidemia, diabetes mellitus, GERD, depression, anxiety, tonsillar cancer, RLS, left breast cancer (s/p of lumpectomy, radiation and chemotherapy), gastroparesis, anemia,  who presents to ED for fever and chills. Patient went to bathroom but felt too weak to get back into bed so laid on the gorund until her friend came at 2 pm due to them being worried she wouldn't answer her calls. Friend called EMS. Patient was found to be septic at ED.   Clinical Impression  Patient is a pleasant 74 year old female who presents with generalized weakness and unsteadiness. Patient was cleared by attending Alfredia Ferguson for PT evaluation and present in patient's room is friend and brother. Prior to hospital admission, pt was independent working at a vet's office and lives alone in a single level home with her dog.  Patient is independent at baseline without use of AD. Patient is able to transition to EOB without assistance. She ambulates without an AD with 4L of 02 via nasal cannula with PT (not on oxygen at baseline) to restroom. After toileting and standing to ambulate again patient RR elevated to 40 and multiple PVCs were noted requiring patient to be returned to bed. Nursing and physician notified. Patient situated with needs met in bed and family present.  Pt would benefit from skilled PT to address noted impairments and functional limitations (see below for any additional details).  Upon hospital discharge, pt would benefit from HHPT and intermittent supervision/assistance from an aide due to current loss of independence and risk for falling.        Recommendations for follow up therapy are one component of a multi-disciplinary discharge planning process, led by the attending physician.   Recommendations may be updated based on patient status, additional functional criteria and insurance authorization.  Follow Up Recommendations Home health PT    Assistance Recommended at Discharge Intermittent Supervision/Assistance  Functional Status Assessment Patient has had a recent decline in their functional status and demonstrates the ability to make significant improvements in function in a reasonable and predictable amount of time.  Equipment Recommendations  None recommended by PT    Recommendations for Other Services       Precautions / Restrictions Precautions Precautions: Fall Precaution Comments: due to PVC's and RR Restrictions Weight Bearing Restrictions: No      Mobility  Bed Mobility Overal bed mobility: Independent             General bed mobility comments: able to transition without use of railing without assistance.    Transfers Overall transfer level: Modified independent Equipment used: None               General transfer comment: CGA sit to stand from toilet and bed. cue for breathing to reduce risk of instability    Ambulation/Gait Ambulation/Gait assistance: Min guard;Supervision Gait Distance (Feet): 38 Feet (on 4 L of sp02) Assistive device: None Gait Pattern/deviations: Step-through pattern;Shuffle;Decreased stride length Gait velocity: decreased   General Gait Details: Patient gait limited by RR elevation to 40 and multiple PVCs.  Stairs            Wheelchair Mobility    Modified Rankin (Stroke Patients Only)       Balance Overall balance assessment: Needs assistance Sitting-balance support: No upper extremity supported;Feet unsupported Sitting balance-Leahy Scale: Good Sitting balance -  Comments: able to move LE's without LOB   Standing balance support: No upper extremity supported;During functional activity Standing balance-Leahy Scale: Fair Standing balance comment: able to ambulate without AD but has sway  with ambulation. unable to test further due to vitals requiring patient to return to supine position.                             Pertinent Vitals/Pain Pain Assessment: No/denies pain    Home Living Family/patient expects to be discharged to:: Private residence Living Arrangements: Alone Available Help at Discharge: Family;Friend(s) Type of Home: House Home Access: Level entry       Home Layout: One level Home Equipment: Shower seat;Grab bars - tub/shower Additional Comments: Patient is Ind at baseline    Prior Function Prior Level of Function : Independent/Modified Independent             Mobility Comments: Patient ambulates without AD at baseline ADLs Comments: ind at baseline     Hand Dominance        Extremity/Trunk Assessment   Upper Extremity Assessment Upper Extremity Assessment: Defer to OT evaluation    Lower Extremity Assessment Lower Extremity Assessment: Generalized weakness (grossly 4-/5 bilaterally.)    Cervical / Trunk Assessment Cervical / Trunk Assessment: Normal  Communication   Communication: No difficulties  Cognition Arousal/Alertness: Awake/alert Behavior During Therapy: WFL for tasks assessed/performed Overall Cognitive Status: Within Functional Limits for tasks assessed                                 General Comments: Patient A and O x 4.  Initially sleepy but improves with lights on.        General Comments General comments (skin integrity, edema, etc.): Patient appears well groomed and nourished    Exercises Other Exercises Other Exercises: Patient educated on role of PT in acute care setting, safe mobility and transfers. Safe toileting techniques.   Assessment/Plan    PT Assessment Patient needs continued PT services  PT Problem List Decreased strength;Decreased activity tolerance;Decreased balance;Decreased mobility;Cardiopulmonary status limiting activity       PT Treatment Interventions DME  instruction;Gait training;Stair training;Functional mobility training;Therapeutic activities;Therapeutic exercise;Manual techniques;Patient/family education;Neuromuscular re-education;Balance training    PT Goals (Current goals can be found in the Care Plan section)  Acute Rehab PT Goals Patient Stated Goal: to return home PT Goal Formulation: With patient Time For Goal Achievement: 12/24/20 Potential to Achieve Goals: Fair    Frequency Min 2X/week   Barriers to discharge Decreased caregiver support will need HHPT and an aide    Co-evaluation               AM-PAC PT "6 Clicks" Mobility  Outcome Measure Help needed turning from your back to your side while in a flat bed without using bedrails?: None Help needed moving from lying on your back to sitting on the side of a flat bed without using bedrails?: None Help needed moving to and from a bed to a chair (including a wheelchair)?: None Help needed standing up from a chair using your arms (e.g., wheelchair or bedside chair)?: A Little Help needed to walk in hospital room?: A Little Help needed climbing 3-5 steps with a railing? : A Lot 6 Click Score: 20    End of Session Equipment Utilized During Treatment: Gait belt;Oxygen (4 L via nasal cannula) Activity Tolerance: Patient limited by  fatigue;Other (comment) (RR 40, multiple PVCs) Patient left: in bed;with call bell/phone within reach;with family/visitor present Nurse Communication: Mobility status;Other (comment) (RR 40, PVCs) PT Visit Diagnosis: Unsteadiness on feet (R26.81);Other abnormalities of gait and mobility (R26.89);Muscle weakness (generalized) (M62.81);Difficulty in walking, not elsewhere classified (R26.2)    Time: 1916-6060 PT Time Calculation (min) (ACUTE ONLY): 31 min   Charges:   PT Evaluation $PT Eval Moderate Complexity: 1 Mod PT Treatments $Therapeutic Activity: 8-22 mins        Janna Arch, PT, DPT  12/10/2020, 4:34 PM

## 2020-12-10 NOTE — Progress Notes (Signed)
PROGRESS NOTE    Suzanne Strong  XQJ:[id number] DOB: [birth year] DOA: 12/09/2020 PCP: [name], MD   Brief Narrative:  The patient is a 75 year old obese Caucasian female with a past medical history significant for but not limited to type 2 diabetes mellitus, GERD, hypertension, hyperlipidemia, peripheral neuropathy, history of remote throat cancer status post radiotherapy, restless leg syndrome as well as other comorbidities including iron deficiency and B12 deficiency anemia anxiety and depression who presented to the ED with acute onset of fevers and associated chills.  She states that she had been feeling weak for the last few days and called her friend last night to tell her that she was running low-grade fever.  Around 10 in the morning she felt unwell and febrile trended up to 101.8.  Patient ambulated to the bathroom but felt too weak to get back into the bed so she laid on the ground until her friend came at 2 PM after her friend tried calling her several times without answer.  Friend noticed the patient was confused and EMS was called.  When she came to the ED she was found to be septic and had elevated BUN/creatinine as well as elevated CK and leukocytosis of 20.6.  Chest x-ray showed a left lower lobe consolidation concerning for pneumonia.  In the ED he was given IV cefepime, IV vancomycin and IV Flagyl given 1 L normal saline and then placed on maintenance IV fluids at 150 MLS per hour given her Rhabdomyolysis.  Assessment & Plan:   Active Problems:   Aspiration pneumonia (HCC)  Sepsis secondary to left lower lobe community-acquired pneumonia concern in the setting of recurrent aspiration -She is admitted to [place] inpatient -She met sepsis criteria as she was tachycardic, tachypneic, febrile and had remarkable leukocytosis -Urinalysis done and showed a cloudy appearance with yellow color urine, large hemoglobin, 5 ketones, small leukocytes, negative nitrites, rare bacteria,  0-5 RBCs per high-power field, 0-5 squamous epithelial cells, 6-10 WBCs, and urine cultures are still pending -Influenza A/B and SARS coronavirus 2 PCR were negative -She was initiated on antibiotics with IV levofloxacin given history of previous anaphylaxis with penicillin but will change to IV azithromycin and IV ceftriaxone -Continue with mucolytic's with guaifenesin 1200 p.o. twice daily -Blood and sputum culture have been ordered and pending -Speech therapy has been consulted and they work with the patient this morning and she does have a history of chronic aspiration of all oral intake.  She had most recent MBS in 02/05/2020 which revealed this again; per SLP there is not a consistency that would be safe for her especially liquids but for food she mentions all the foods are self.  SLP has instructed the patient about throat clearing cough to clear aspirated material and have discussed the prognosis of continued aspiration including recurrent pneumonias; there has been discussion about PEG tube placement and this can be done in outpatient setting -We will consult palliative for goals of care discussion given multiple aspiration events in the past -PCT was 16.20 -She is now on a dysphagia 3 diet with no straws and thin liquids -Continue empiric antibiotics with IV azithromycin and IV ceftriaxone -We will continue with supportive care and continue with IV fluids as above -Patient has been placed on duo nebs 3 mils nebs 4 times daily -Obtain PT/OT evaluation and Mobilization   Acute respiratory failure with hypoxia in the setting of aspiration pneumonia -Treatment As above; Does not wear Supplemental O2 at Home -SpO2: 92 % O2  Flow Rate (L/min): 4 L/min -Continuous pulse oximetry maintain O2 saturation greater 90% -Continue supplemental oxygen via nasal cannula and wean O2 as tolerated -She will need an ambulatory home O2 screen prior to discharge and repeat chest x-ray in a.m.  Lactic  Acidosis -In setting of sepsis -Patient's lactic acid level went from 3.1 -> 4.1 -> 2.2 -> 2.5 -IV fluid hydration as above -Continue monitor and trend carefully and repeat LA in the AM   AKI Metabolic Acidosis  -Patient's BUN/Cr went from 18/0.87 -> 30/1.75 -> 32/1.60 -Patient now has a small metabolic acidosis with a CO2 of 21, anion gap of 12, chloride level of 102 -C/w IVF Hydration as above with LR at 150 mL/hr -Avoid nephrotoxic medications, contrast dyes, hypotension and dehydration and renally dose medications -Continue to monitor and trend renal function closely and repeat CMP in a.m.  Acute Rhabdomyolysis  -Has elevated CK and Troponin on Admission -CK went from 6349 -> 3940 -C/w IVF Hydration as above -Continue to Monitor and Trend and Repeat CK in the AM   Elevated Troponin -In the setting of Rhabdomyolysis and not indicative of ACS -Trop went from 562 -> 574 -> 389  Essential HTN -C/w Amlodipine 5 mg po Daily -Continue to Monitor BP per Protocol -Last BP Reading was 96/80  Anixety and Depression -C/w Sertraline, Buspirone 15 mg po BID and with Alprazolam 0.5 mg po BID -C/w Trazodone 50 mg qHSprn Sleep  HLD -Holding her Statin given Abnormal AST from Rhabdomyolysis  Abnormal AST -In the setting of Rhabdomyolysis  -AST went from 137 -> 153 -ALT went from 30 -> 35  -Continue to Monitor and Trend LFTs carefully and if necessary will need a RUQ U/S and Acute Hepatitis Panel  -Repeat CMP in the AM  Iron Deficiency Anemia -Patient's Hgb/Hct went from 10.8/34.6 -> 12.7/39.0 -> 12.2/38.2 -Check Anemia Panel in the AM  -C/w Ferrous Sulfate 325 mg po BID -Continue to Monitor for S/Sx of Bleeding; No overt bleeding noted -Repeat CBC in the AM   Diabetes Mellitus Type 2 Complicated by Neuropathy and Hx of Gastroparesis -C/w Linaglptin 5 mg po Daily -Will place on a Sensitive Novolog SSI AC -Check HbA1c in the AM; Last HbA1c in our records was 6.7 -Continue to  Monitor CBG's per protocol; Blood Sugars ranging from 76-152 on Daily BMP/CMP -C/w Pregabalin 150 mg po qHS  Hx of Breast Cancer -Had 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   Weight as of this encounter: 93.4 kg. -Weight Loss and Dietary Counseling given   DVT prophylaxis: Enoxaparin 47.5 mg sq q24h Code Status: FULL CODE Family Communication: No family present at bedside  Disposition Plan: Pending further clinical improvement of her pneumonia and evaluation by PT and OT.  Patient is also to undergo a palliative care discussion given her recurrent aspirations  Status is: Inpatient  Remains inpatient appropriate because: She remains on oxygen does not wear oxygen at home and needs a PT OT as well as improvement in her pneumonia and evaluation by palliative prior to discharge.  Consultants:  Palliative Care   Procedures: None  Antimicrobials:  Anti-infectives (From admission, onward)  Start     Dose/Rate Route Frequency Ordered Stop   12/10/20 1000  cefTRIAXone (ROCEPHIN) 1 g in sodium chloride 0.9 % 100 mL IVPB        1 g 200 mL/hr over 30 Minutes Intravenous Every 24 hours 12/09/20 2240     12/09/20 2245  levofloxacin (LEVAQUIN) IVPB 750 mg  Status:  Discontinued        750 mg 100 mL/hr over 90 Minutes Intravenous Every 24 hours 12/09/20 2233 12/09/20 2239   12/09/20 2245  azithromycin (ZITHROMAX) 500 mg in sodium chloride 0.9 % 250 mL IVPB        500 mg 250 mL/hr over 60 Minutes Intravenous Every 24 hours 12/09/20 2240     12/09/20 2145  aztreonam (AZACTAM) 2 g in sodium chloride 0.9 % 100 mL IVPB  Status:  Discontinued         2 g 200 mL/hr over 30 Minutes Intravenous  Once 12/09/20 2133 12/09/20 2144   12/09/20 2145  metroNIDAZOLE (FLAGYL) IVPB 500 mg        500 mg 100 mL/hr over 60 Minutes Intravenous  Once 12/09/20 2133 12/09/20 2352   12/09/20 2145  vancomycin (VANCOCIN) IVPB 1000 mg/200 mL premix        1,000 mg 200 mL/hr over 60 Minutes Intravenous  Once 12/09/20 2133 12/09/20 2353   12/09/20 2145  ceFEPIme (MAXIPIME) 2 g in sodium chloride 0.9 % 100 mL IVPB        2 g 200 mL/hr over 30 Minutes Intravenous  Once 12/09/20 2144 12/09/20 2328        Subjective: Seen and examined at bedside and was a little short of breath and states that she has been coughing up a lot of sputum.  No chest pain or lightheadedness or dizziness.  No nausea or vomiting.  States that she feels very weak.  No other concerns or complaints this time.  Objective: Vitals:   12/10/20 0515 12/10/20 0530 12/10/20 0600 12/10/20 0800  BP:    (!) 115/52  Pulse: 99 (!) 102  93  Resp: (!) 25 (!) 22 (!) 24 (!) 22  Temp:  98.8 F (37.1 C)    TempSrc:  Oral    SpO2: (!) 89% 90%  92%  Weight:      Height:        Intake/Output Summary (Last 24 hours) at 12/10/2020 7681 Last data filed at 12/10/2020 1572 Gross per 24 hour  Intake 218.41 ml  Output --  Net 218.41 ml   Filed Weights   12/09/20 1810  Weight: 93.4 kg   Examination: Physical Exam:  Constitutional: WN/WD obese Caucasian female currently in no acute distress appears a little uncomfortable Eyes: Lids and conjunctivae normal, sclerae anicteric  ENMT: External Ears, Nose appear normal. Grossly normal hearing. Mucous membranes are moist.  Neck: Appears normal, supple, no cervical masses, normal ROM, no appreciable thyromegaly; no real appreciable JVD Respiratory: Diminished to auscultation bilaterally with coarse breath sounds at the bases and some rhonchi.  No appreciable crackles or rales. Normal respiratory effort and patient is not tachypenic. No accessory muscle  use.  She has unlabored breathing but she is wearing supplemental oxygen via nasal cannula Cardiovascular: RRR, no murmurs / rubs / gallops. S1 and S2 auscultated.  Mild 1+ lower from edema Abdomen: Soft, non-tender, distended secondary body habitus. Bowel sounds positive.  GU: Deferred. Musculoskeletal: No clubbing / cyanosis of digits/nails. No joint deformity upper and lower extremities.  Skin: No rashes, lesions,  ulcers on limited skin evaluation. No induration; Warm and dry.  Neurologic: CN 2-12 grossly intact with no focal deficits. Romberg sign and cerebellar reflexes not assessed.  Psychiatric: Normal judgment and insight. Alert and oriented x 3. Normal mood and appropriate affect.   Data Reviewed: I have personally reviewed following labs and imaging studies  CBC: Recent Labs  Lab 12/09/20 2003 12/10/20 0152  WBC 20.6* 15.7*  NEUTROABS 17.5*  --   HGB 12.7 12.2  HCT 39.0 38.2  MCV 91.5 94.3  PLT 213 284   Basic Metabolic Panel: Recent Labs  Lab 12/09/20 2003 12/10/20 0152  NA 136 135  K 4.5 4.5  CL 100 102  CO2 23 21*  GLUCOSE 152* 128*  BUN 30* 32*  CREATININE 1.75* 1.60*  CALCIUM 8.5* 7.8*   GFR: Estimated Creatinine Clearance: 36 mL/min (A) (by C-G formula based on SCr of 1.6 mg/dL (H)). Liver Function Tests: Recent Labs  Lab 12/09/20 2003  AST 137*  ALT 30  ALKPHOS 48  BILITOT 1.3*  PROT 7.8  ALBUMIN 3.4*   No results for input(s): LIPASE, AMYLASE in the last 168 hours. No results for input(s): AMMONIA in the last 168 hours. Coagulation Profile: Recent Labs  Lab 12/09/20 2145  INR 1.3*   Cardiac Enzymes: Recent Labs  Lab 12/09/20 2003  CKTOTAL 6,349*   BNP (last 3 results) No results for input(s): PROBNP in the last 8760 hours. HbA1C: No results for input(s): HGBA1C in the last 72 hours. CBG: No results for input(s): GLUCAP in the last 168 hours. Lipid Profile: No results for input(s): CHOL, HDL, LDLCALC, TRIG, CHOLHDL, LDLDIRECT in  the last 72 hours. Thyroid Function Tests: No results for input(s): TSH, T4TOTAL, FREET4, T3FREE, THYROIDAB in the last 72 hours. Anemia Panel: No results for input(s): VITAMINB12, FOLATE, FERRITIN, TIBC, IRON, RETICCTPCT in the last 72 hours. Sepsis Labs: Recent Labs  Lab 12/09/20 2145 12/10/20 0152 12/10/20 0447  LATICACIDVEN 3.1* 4.1* 2.2*    Recent Results (from the past 240 hour(s))  Resp Panel by RT-PCR (Flu A&B, Covid) Nasopharyngeal Swab     Status: None   Collection Time: 12/09/20  9:45 PM   Specimen: Nasopharyngeal Swab; Nasopharyngeal(NP) swabs in vial transport medium  Result Value Ref Range Status   SARS Coronavirus 2 by RT PCR NEGATIVE NEGATIVE Final    Comment: (NOTE) SARS-CoV-2 target nucleic acids are NOT DETECTED.  The SARS-CoV-2 RNA is generally detectable in upper respiratory specimens during the acute phase of infection. The lowest concentration of SARS-CoV-2 viral copies this assay can detect is 138 copies/mL. A negative result does not preclude SARS-Cov-2 infection and should not be used as the sole basis for treatment or other patient management decisions. A negative result may occur with  improper specimen collection/handling, submission of specimen other than nasopharyngeal swab, presence of viral mutation(s) within the areas targeted by this assay, and inadequate number of viral copies(<138 copies/mL). A negative result must be combined with clinical observations, patient history, and epidemiological information. The expected result is Negative.  Fact Sheet for Patients:  EntrepreneurPulse.com.au  Fact Sheet for Healthcare Providers:  IncredibleEmployment.be  This test is no t yet approved or cleared by the [place] FDA and  has been authorized for detection and/or diagnosis of SARS-CoV-2 by FDA under an Emergency Use Authorization (EUA). This EUA will remain  in effect (meaning this test can be used) for  the duration of the COVID-19 declaration under Section 564(b)(1) of the Act, 21 U.S.C.section 360bbb-3(b)(1), unless  the authorization is terminated  or revoked sooner.       Influenza A by PCR NEGATIVE NEGATIVE Final   Influenza B by PCR NEGATIVE NEGATIVE Final    Comment: (NOTE) The Xpert Xpress SARS-CoV-2/FLU/RSV plus assay is intended as an aid in the diagnosis of influenza from Nasopharyngeal swab specimens and should not be used as a sole basis for treatment. Nasal washings and aspirates are unacceptable for Xpert Xpress SARS-CoV-2/FLU/RSV testing.  Fact Sheet for Patients: EntrepreneurPulse.com.au  Fact Sheet for Healthcare Providers: IncredibleEmployment.be  This test is not yet approved or cleared by the [place] FDA and has been authorized for detection and/or diagnosis of SARS-CoV-2 by FDA under an Emergency Use Authorization (EUA). This EUA will remain in effect (meaning this test can be used) for the duration of the COVID-19 declaration under Section 564(b)(1) of the Act, 21 U.S.C. section 360bbb-3(b)(1), unless the authorization is terminated or revoked.  Performed at [facility], [address], [place], [place] [address]   Blood Culture (routine x 2)     Status: None (Preliminary result)   Collection Time: 12/09/20  9:45 PM   Specimen: BLOOD  Result Value Ref Range Status   Specimen Description BLOOD RIGHT ASSIST CONTROL  Final   Special Requests   Final    BOTTLES DRAWN AEROBIC AND ANAEROBIC Blood Culture results may not be optimal due to an inadequate volume of blood received in culture bottles   Culture   Final    NO GROWTH < 12 HOURS Performed at [facility], [address], [place], [place] [address]    Report Status PENDING  Incomplete  Blood Culture (routine x 2)     Status: None (Preliminary result)   Collection Time: 12/09/20  9:45 PM   Specimen: BLOOD  Result Value Ref Range Status    Specimen Description BLOOD RIGHT ASSIST CONTROL  Final   Special Requests   Final    BOTTLES DRAWN AEROBIC AND ANAEROBIC Blood Culture results may not be optimal due to an inadequate volume of blood received in culture bottles   Culture   Final    NO GROWTH < 12 HOURS Performed at [facility], [address], [place], [place] [address]    Report Status PENDING  Incomplete    RN Pressure Injury Documentation:     Estimated body mass index is 33.25 kg/m as calculated from the following:   Height as of this encounter: '5\' 6"'  (1.676 m).   Weight as of this encounter: 93.4 kg.  Malnutrition Type:   Malnutrition Characteristics:   Nutrition Interventions:    Radiology Studies: DG Chest 2 View  Result Date: 12/09/2020 CLINICAL DATA:  Weakness. EXAM: CHEST - 2 VIEW COMPARISON:  11/12/2020 FINDINGS: Consolidation in the left lower lobe compatible with pneumonia. No confluent opacity on the right. Heart is borderline in size. Aortic atherosclerosis. No acute bony abnormality. IMPRESSION: Left lower lobe consolidation concerning for pneumonia. Electronically Signed   By: Rolm Baptise M.D.   On: 12/09/2020 20:56    Scheduled Meds:  [START ON 12/11/2020] amLODipine  5 mg Oral Daily   atorvastatin  10 mg Oral Daily   b complex vitamins  1 tablet Oral Daily   busPIRone  15 mg Oral BID   Cholecalciferol  2,000 Units Oral Daily   enoxaparin (LOVENOX) injection  0.5 mg/kg Subcutaneous Q24H   ferrous sulfate  325 mg Oral BID WC   guaiFENesin  600 mg Oral BID   ipratropium-albuterol  3 mL Nebulization QID   linagliptin  5 mg Oral Daily   metFORMIN  1,000 mg Oral BID WC   pantoprazole  40 mg Oral Daily   pregabalin  300 mg Oral QHS   rOPINIRole  0.5 mg Oral Daily   rOPINIRole  3 mg Oral QHS   sertraline  200 mg Oral q morning   Continuous Infusions:  sodium chloride Stopped (12/10/20 0215)   azithromycin Stopped (12/10/20 0123)   cefTRIAXone (ROCEPHIN)  IV     lactated  ringers Stopped (12/10/20 0122)    LOS: 1 day   Kerney Elbe, DO [facility] PAGER is on [place]  If 7PM-7AM, please contact night-coverage [email or web address]

## 2020-12-11 DIAGNOSIS — J690 Pneumonitis due to inhalation of food and vomit: Secondary | ICD-10-CM | POA: Diagnosis not present

## 2020-12-11 LAB — PROCALCITONIN: Procalcitonin: 9.27 ng/mL

## 2020-12-11 LAB — URINE CULTURE: Culture: <10000 — AB

## 2020-12-11 LAB — CBC WITH DIFFERENTIAL/PLATELET
Abs Immature Granulocytes: 0.06 10*3/uL (ref 0.00–0.07)
Basophils Absolute: 0 10*3/uL (ref 0.0–0.1)
Basophils Relative: 0 %
Eosinophils Absolute: 0.1 10*3/uL (ref 0.0–0.5)
Eosinophils Relative: 1 %
HCT: 29.7 % — ABNORMAL LOW (ref 36.0–46.0)
Hemoglobin: 9.3 g/dL — ABNORMAL LOW (ref 12.0–15.0)
Immature Granulocytes: 1 %
Lymphocytes Relative: 10 %
Lymphs Abs: 0.8 10*3/uL (ref 0.7–4.0)
MCH: 28.4 pg (ref 26.0–34.0)
MCHC: 31.3 g/dL (ref 30.0–36.0)
MCV: 90.5 fL (ref 80.0–100.0)
Monocytes Absolute: 0.3 10*3/uL (ref 0.1–1.0)
Monocytes Relative: 4 %
Neutro Abs: 7.3 10*3/uL (ref 1.7–7.7)
Neutrophils Relative %: 84 %
Platelets: 159 10*3/uL (ref 150–400)
RBC: 3.28 MIL/uL — ABNORMAL LOW (ref 3.87–5.11)
RDW: 15.1 % (ref 11.5–15.5)
WBC: 8.7 10*3/uL (ref 4.0–10.5)
nRBC: 0 % (ref 0.0–0.2)

## 2020-12-11 LAB — GLUCOSE, CAPILLARY
Glucose-Capillary: 116 mg/dL — ABNORMAL HIGH (ref 70–99)
Glucose-Capillary: 160 mg/dL — ABNORMAL HIGH (ref 70–99)

## 2020-12-11 LAB — RETICULOCYTES
Immature Retic Fract: 10.6 % (ref 2.3–15.9)
RBC.: 3.15 MIL/uL — ABNORMAL LOW (ref 3.87–5.11)
Retic Count, Absolute: 36.5 10*3/uL (ref 19.0–186.0)
Retic Ct Pct: 1.2 % (ref 0.4–3.1)

## 2020-12-11 LAB — IRON AND TIBC
Iron: 15 ug/dL — ABNORMAL LOW (ref 28–170)
Saturation Ratios: 6 % — ABNORMAL LOW (ref 10.4–31.8)
TIBC: 252 ug/dL (ref 250–450)
UIBC: 237 ug/dL

## 2020-12-11 LAB — VITAMIN B12: Vitamin B-12: 208 pg/mL (ref 180–914)

## 2020-12-11 LAB — COMPREHENSIVE METABOLIC PANEL
ALT: 35 U/L (ref 0–44)
AST: 112 U/L — ABNORMAL HIGH (ref 15–41)
Albumin: 2.7 g/dL — ABNORMAL LOW (ref 3.5–5.0)
Alkaline Phosphatase: 43 U/L (ref 38–126)
Anion gap: 3 — ABNORMAL LOW (ref 5–15)
BUN: 30 mg/dL — ABNORMAL HIGH (ref 8–23)
CO2: 28 mmol/L (ref 22–32)
Calcium: 7.6 mg/dL — ABNORMAL LOW (ref 8.9–10.3)
Chloride: 107 mmol/L (ref 98–111)
Creatinine, Ser: 0.84 mg/dL (ref 0.44–1.00)
GFR, Estimated: >60 mL/min (ref >60)
Glucose, Bld: 105 mg/dL — ABNORMAL HIGH (ref 70–99)
Potassium: 3.9 mmol/L (ref 3.5–5.1)
Sodium: 138 mmol/L (ref 135–145)
Total Bilirubin: 0.8 mg/dL (ref 0.3–1.2)
Total Protein: 6.4 g/dL — ABNORMAL LOW (ref 6.5–8.1)

## 2020-12-11 LAB — FOLATE: Folate: 13.8 ng/mL

## 2020-12-11 LAB — FERRITIN: Ferritin: 187 ng/mL (ref 11–307)

## 2020-12-11 LAB — HEMOGLOBIN A1C
Hgb A1c MFr Bld: 6.5 % — ABNORMAL HIGH (ref 4.8–5.6)
Mean Plasma Glucose: 139.85 mg/dL

## 2020-12-11 LAB — MAGNESIUM: Magnesium: 1.5 mg/dL — ABNORMAL LOW (ref 1.7–2.4)

## 2020-12-11 LAB — PHOSPHORUS: Phosphorus: 3 mg/dL (ref 2.5–4.6)

## 2020-12-11 MED ORDER — CEFDINIR 300 MG PO CAPS: 300.0000 mg | ORAL_CAPSULE | Freq: Two times a day (BID) | ORAL | 0 refills | Status: AC

## 2020-12-11 MED ORDER — AZITHROMYCIN 500 MG PO TABS: 500.0000 mg | ORAL_TABLET | Freq: Every day | ORAL | 0 refills | Status: AC

## 2020-12-11 MED ORDER — IPRATROPIUM-ALBUTEROL 0.5-2.5 (3) MG/3ML IN SOLN
3.0000 mL | Freq: Three times a day (TID) | RESPIRATORY_TRACT | Status: DC
  Filled 2020-12-11: qty 3

## 2020-12-11 NOTE — Progress Notes (Signed)
DC tele monitoring per Dr [place]

## 2020-12-11 NOTE — TOC Transition Note (Signed)
Transition of Care [facility]) - CM/SW Discharge Note   Patient Details  Name: WINDY DUDEK MRN: [id number] Date of Birth: 07-16-1945  Transition of Care [facility]) CM/SW Contact:  Shelbie Hutching, RN Phone Number: 12/11/2020, 4:14 PM   Clinical Narrative:    Patient admitted to the hospital with aspiration pneumonia.  Patient is medically cleared for discharge home today.  RNCM met with patient at the bedside.  Patient's friend, Gregary Signs, is there and will be taking patient home today.  Patient declines home health services and says that she does not need them.  She drives and is able to get to her appointments.  Patient is current with her PCP.     Final next level of care: Home/Self Care Barriers to Discharge: Barriers Resolved   Patient Goals and CMS Choice Patient states their goals for this hospitalization and ongoing recovery are:: Patient glad to be going home      Discharge Placement                       Discharge Plan and Services   Discharge Planning Services: CM Consult            DME Arranged: N/A DME Agency: NA       HH Arranged: Patient Refused [place]          Social Determinants of Health (SDOH) Interventions     Readmission Risk Interventions No flowsheet data found.

## 2020-12-11 NOTE — Discharge Summary (Signed)
Physician Discharge Summary  [name] NAT:[id number] DOB: May 09, 1945 DOA: 12/09/2020  PCP: [name], MD  Admit date: 12/09/2020 Discharge date: 12/11/2020  Admitted From: Home Disposition: Home  Recommendations for Outpatient Follow-up:  Follow up with PCP in 1-2 weeks Continue antibiotics with azithromycin and cefdinir to complete treatment for pneumonia Continue to encourage and instruct on aspiration precautions as likely etiology to her pneumonia Obtain CMP 1 week to evaluate LFTs Follow-up finalized blood cultures which were pending at time of discharge.  Home Health: Patient declined home health services on discharge Equipment/Devices: None  Discharge Condition: Stable CODE STATUS: Full code Diet recommendation: Heart healthy/consistent carbohydrate diet  History of present illness:  TERECIA Strong is a 75 year old female with past medical history significant for type 2 diabetes mellitus, GERD, essential hypertension, hyperlipidemia, peripheral neuropathy, history of remote throat cancer s/p radiotherapy, restless leg syndrome, obesity, iron deficiency anemia, B12 deficiency, anxiety/depression who presented to [facility] ED on 12/09/2020 with fevers, chills, shortness of breath.  Patient also reports feeling weak over the last few days.  Patient reports fever up to 101.8.  Patient attempted to ambulate to her bathroom but felt too weak to get back to bed so she laid on the ground her friend came over.  When friend arrived, patient was noted to be confused and EMS was activated.  In the ED, patient was found to be septic with elevated BUN/creatinine, elevated CK level and leukocytosis of 20.6.  Chest x-ray with left lower lobe consolidation concerning for pneumonia.  Patient was started on IV cefepime, vancomycin and IV Flagyl and given 1 L NS bolus.  TRH consulted for further evaluation and management of acute metabolic encephalopathy secondary to community versus  aspiration pneumonia and rhabdomyolysis.   Hospital course:  Acute metabolic encephalopathy, POA: Resolved Patient presenting to the ED via EMS after being found confused by friend.  Etiology likely secondary to acute infectious process with pneumonia.  Patient's mental status now back at her normal baseline following treatment as below.  Sepsis, POA Community acquired versus aspiration pneumonia Acute respiratory failure with hypoxia, POA: Resolved Lactic acidosis, POA Patient met sepsis criteria on admission with tachypnea, fever, leukocytosis in the setting of end organ damage with acute metabolic encephalopathy, acute renal failure,  with source of infection identified within her lungs with left lower lobe consolidation.  Influenza A/B PCR negative.  COVID-19 PCR negative.  Lactic acid 3.1.  Patient started on empiric antibiotics with IV vancomycin/cefepime/Flagyl and transition to azithromycin and ceftriaxone.  WBC count improved from 20.6 to 8.7 at time of discharge.  Blood cultures x2 showed no growth x2 days while during hospitalization.  Urine culture with insignificant growth.  Patient was titrated off of supplemental oxygen.  We will continue azithromycin and cefdinir to complete antibiotic course after discharge.  Discussed with patient's regarding aspiration precautions at home.  Acute renal failure: Resolved Patient's creatinine trended up to high 1.75, started on IV fluid hydration with improvement of creatinine to 0.84 at time of discharge.  Acute rhabdomyolysis Troponin peaked at 574.  Patient was treated with IV fluid hydration.  Patient symptoms of muscle aches resolved.  Elevated troponin High sensitive troponin elevated at 562, followed by 574, and 389.  Etiology likely secondary to type II demand ischemia in the setting of sepsis and rhabdomyolysis as above.  Chest pain-free.  Not indicative of acute coronary syndrome.  Anxiety/depression Continue sertraline, buspirone,  alprazolam, trazodone nightly.  HLD: Resume statin on discharge.  Elevated LFTs  Etiology likely secondary to sepsis/rhabdomyolysis as above.  LFTs improved.  Repeat CMP 1 week.  Iron deficiency anemia Hemoglobin 9.3 at time of discharge.  Continue ferrous sulfate.  Type 2 diabetes mellitus Hemoglobin A1c 6.5, well controlled.  Continue home Sitagliptin-metformin 50-1000 mg p.o. twice daily.  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  Outpatient follow-up with oncology.  Restless leg syndrome: Ropinirole  GERD: Continue PPI  Obesity Body mass index is 33.25 kg/m.  Discussed with patient needs for aggressive lifestyle changes/weight loss as this complicates all facets of care.  Outpatient follow-up with PCP.  May benefit from bariatric evaluation outpatient.  Discharge Diagnoses:  Active Problems:   Aspiration pneumonia [facility])    Discharge Instructions  Discharge Instructions     Call MD for:  difficulty breathing, headache or visual disturbances   Complete by: As directed    Call MD for:  extreme fatigue   Complete by: As directed    Call MD for:  persistant dizziness or light-headedness   Complete by: As directed    Call MD for:  persistant nausea and vomiting   Complete by: As directed    Call MD for:  severe uncontrolled pain   Complete by: As directed    Call MD for:  temperature >100.4   Complete by: As directed    Diet - low sodium heart healthy   Complete by: As directed    Increase activity slowly   Complete by: As directed       Allergies as of 12/11/2020       Reactions   Amoxicillin Swelling   REACTION: rash, swelling Has patient had a PCN reaction causing immediate rash, facial/tongue/throat swelling, SOB or lightheadedness with hypotension: yes Has patient had a PCN reaction causing severe rash involving mucus membranes or skin necrosis: no Has patient had a PCN reaction that  required hospitalization: no Has patient had a PCN reaction occurring within the last 10 years: yes If all of the above answers are "NO", then may proceed with Cephalosporin use. 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   albuterol 108 (90 Base) MCG/ACT inhaler Commonly known as: VENTOLIN HFA INHALE TWO PUFFS BY MOUTH EVERY SIX HOURS AS NEEDED   ALPRAZolam 0.5 MG tablet Commonly known as: XANAX Take ONE tablet by MOUTH twice daily as needed FOR anxiety OR SLEEP   amitriptyline 50 MG tablet Commonly known as: ELAVIL Take 1 tablet (50 mg total) by mouth at bedtime.   amLODipine 5 MG tablet Commonly known as: NORVASC Take 1 tablet (5 mg total) by mouth daily.   atorvastatin 10 MG tablet Commonly known as: LIPITOR TAKE 1 TABLET(10 MG) BY MOUTH DAILY   azithromycin 500 MG tablet Commonly known as: Zithromax Take 1 tablet (500 mg total) by mouth daily for 4 days. Take 1 tablet daily for 3 days.   b complex vitamins tablet Take 1 tablet  by mouth daily.   Belsomra 20 MG Tabs Generic drug: Suvorexant Take 20 mg by mouth at bedtime as needed.   busPIRone 15 MG tablet Commonly known as: BUSPAR Take 1 tablet in the morning and 1/2 tablet in the evening for one week, then increase to 1 tablet twice daily   cefdinir 300 MG capsule Commonly known as: OMNICEF Take 1 capsule (300 mg total) by mouth 2 (two) times daily for 6 days.   COLLAGEN EX   ferrous sulfate 325 (65 FE) MG tablet Take 1 tablet (325 mg total) by mouth 2  (two) times daily with a meal.   glipiZIDE 5 MG tablet Commonly known as: GLUCOTROL Take 5 mg by mouth daily before breakfast.   lidocaine-prilocaine cream Commonly known as: EMLA Apply 1 application topically as needed. Apply to port when going through Chemo   MELATONIN PO Take 10 mg by mouth.   omeprazole 20 MG capsule Commonly known as: PRILOSEC TAKE ONE CAPSULE BY MOUTH EVERYDAY AT BEDTIME   OneTouch Ultra test strip Generic drug: glucose blood USE TO check blood glucose ONCE DAILY AND AS NEEDED FOR DIABETES   pregabalin 150 MG capsule Commonly known as: LYRICA Take 300 mg by mouth at bedtime.   rOPINIRole 1 MG tablet Commonly known as: REQUIP TAKE 1/2 TABLET BY MOUTH EVERY MORNING and TAKE THREE TABLETS BY MOUTH EVERYDAY AT BEDTIME   sertraline 100 MG tablet Commonly known as: ZOLOFT Take 2 tablets (200 mg total) by mouth every morning.   SitaGLIPtin-MetFORMIN HCl 50-1000 MG Tb24 Take 1 tablet by mouth 2 (two) times daily.   traZODone 50 MG tablet Commonly known as: DESYREL TAKE ONE TABLET BY MOUTH EVERYDAY AT BEDTIME   Vitamin D-1000 Max St 25 MCG (1000 UT) tablet Generic drug: Cholecalciferol Take 2,000 Units by mouth daily. Takes three 50 mcg tablets, 3 capsules daily   WOMENS MULTIVITAMIN PO Take by mouth.        Follow-up Information     Tower, [name], MD. Schedule an appointment as soon as possible for a visit in 1 week(s).   Specialties: Family Medicine, Radiology Contact information: [place] [address] [phone]                Allergies  Allergen Reactions   Amoxicillin Swelling    REACTION: rash, swelling Has patient had a PCN reaction causing immediate rash, facial/tongue/throat swelling, SOB or lightheadedness with hypotension: yes Has patient had a PCN reaction causing severe rash involving mucus membranes or skin necrosis: no Has patient had a PCN reaction that required hospitalization: no Has  patient had a PCN reaction occurring within the last 10 years: yes If all of the above answers are "NO", then may proceed with Cephalosporin use.  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 EXAM: CHEST - 2 VIEW COMPARISON:  11/12/2020 FINDINGS: Consolidation in the left lower lobe compatible with pneumonia. No confluent opacity on the right. Heart is borderline in size. Aortic atherosclerosis. No acute bony abnormality. IMPRESSION: Left lower lobe consolidation concerning for pneumonia. Electronically Signed   By:  Rolm Baptise M.D.   On: 12/09/2020 20:56   DG Chest 2 View  Result Date: 11/13/2020 CLINICAL DATA:  75 year old female with prior aspiration pneumonia EXAM: CHEST - 2 VIEW COMPARISON:  07/17/2020 FINDINGS: Cardiomediastinal silhouette unchanged in size and contour. No evidence of central vascular congestion. No interlobular septal thickening. Similar appearance of mild reticulonodular opacities of the lungs, relatively unchanged. No pneumothorax or pleural effusion. Coarsened interstitial markings, with no confluent airspace disease. No acute displaced fracture. Degenerative changes of the spine. IMPRESSION: Similar appearance of the chest x-ray with mild reticulonodular opacities, potentially chronic changes/sequela of prior atypical infection. Electronically Signed   By: Corrie Mckusick D.O.   On: 11/13/2020 09:44   US Venous Img Lower Unilateral Left (DVT)  Result Date: 11/12/2020 CLINICAL DATA:  75 year old female with a history of left lower extremity swelling EXAM: LEFT LOWER EXTREMITY VENOUS DOPPLER ULTRASOUND TECHNIQUE:  Gray-scale sonography with graded compression, as well as color Doppler and duplex ultrasound were performed to evaluate the lower extremity deep venous systems from the level of the common femoral vein and including the common femoral, femoral, profunda femoral, popliteal and calf veins including the posterior tibial, peroneal and gastrocnemius veins when visible. The superficial great saphenous vein was also interrogated. Spectral Doppler was utilized to evaluate flow at rest and with distal augmentation maneuvers in the common femoral, femoral and popliteal veins. COMPARISON:  None. FINDINGS: Contralateral Common Femoral Vein: Respiratory phasicity is normal and symmetric with the symptomatic side. No evidence of thrombus. Normal compressibility. Common Femoral Vein: No evidence of thrombus. Normal compressibility, respiratory phasicity and response to augmentation. Saphenofemoral Junction: No evidence of thrombus. Normal compressibility and flow on color Doppler imaging. Profunda Femoral Vein: No evidence of thrombus. Normal compressibility and flow on color Doppler imaging. Femoral Vein: No evidence of thrombus. Normal compressibility, respiratory phasicity and response to augmentation. Popliteal Vein: No evidence of thrombus. Normal compressibility, respiratory phasicity and response to augmentation. Calf Veins: No evidence of thrombus. Normal compressibility and flow on color Doppler imaging. Superficial Great Saphenous Vein: No evidence of thrombus. Normal compressibility and flow on color Doppler imaging. Other Findings: Lentiform anechoic fluid collection in the popliteal fossa measures 5.6 cm x 2.5 cm x 1.2 cm, with minimal internal echoes IMPRESSION: Sonographic survey of the left lower extremity negative for DVT. Left-sided Baker's cyst Electronically Signed   By: Corrie Mckusick D.O.   On: 11/12/2020 15:42     Subjective: Patient seen examined bedside, resting comfortably.  Lying in bed.  No  complaints this morning.  Has been off of oxygen since yesterday.  Worked with OT this morning with no desaturations on ambulation.  Patient ready for discharge home.  Patient declines home health on discharge.  No other questions or concerns at this time.  Denies headache, no fever/chills/night sweats, no nausea/vomiting/diarrhea, no chest pain, palpitations, no shortness of breath, no abdominal pain, no weakness, no fatigue, no paresthesias.  No acute events overnight per nursing staff.  Discharge Exam: Vitals:   12/11/20 0730 12/11/20 1203  BP: 135/66 109/73  Pulse: 86 90  Resp: 18 16  Temp: 97.7 F (36.5 C) 97.6 F (36.4 C)  SpO2: 91% 91%   Vitals:   12/11/20 0532 12/11/20 0709 12/11/20 0730 12/11/20 1203  BP: (!) 134/58  135/66 109/73  Pulse: 86  86 90  Resp:   18 16  Temp: 97.7 F (36.5 C)  97.7 F (36.5 C) 97.6 F (36.4 C)  TempSrc: Oral     SpO2:  91% 91% 91% 91%  Weight:      Height:        General: Pt is alert, awake, not in acute distress Cardiovascular: RRR, S1/S2 +, no rubs, no gallops Respiratory: CTA bilaterally, no wheezing, no rhonchi, on room air Abdominal: Soft, NT, ND, bowel sounds + Extremities: no edema, no cyanosis    The results of significant diagnostics from this hospitalization (including imaging, microbiology, ancillary and laboratory) are listed below for reference.     Microbiology: Recent Results (from the past 240 hour(s))  Resp Panel by RT-PCR (Flu A&B, Covid) Nasopharyngeal Swab     Status: None   Collection Time: 12/09/20  9:45 PM   Specimen: Nasopharyngeal Swab; Nasopharyngeal(NP) swabs in vial transport medium  Result Value Ref Range Status   SARS Coronavirus 2 by RT PCR NEGATIVE NEGATIVE Final    Comment: (NOTE) SARS-CoV-2 target nucleic acids are NOT DETECTED.  The SARS-CoV-2 RNA is generally detectable in upper respiratory specimens during the acute phase of infection. The lowest concentration of SARS-CoV-2 viral copies this  assay can detect is 138 copies/mL. A negative result does not preclude SARS-Cov-2 infection and should not be used as the sole basis for treatment or other patient management decisions. A negative result may occur with  improper specimen collection/handling, submission of specimen other than nasopharyngeal swab, presence of viral mutation(s) within the areas targeted by this assay, and inadequate number of viral copies(<138 copies/mL). A negative result must be combined with clinical observations, patient history, and epidemiological information. The expected result is Negative.  Fact Sheet for Patients:  EntrepreneurPulse.com.au  Fact Sheet for Healthcare Providers:  IncredibleEmployment.be  This test is no t yet approved or cleared by the [place] FDA and  has been authorized for detection and/or diagnosis of SARS-CoV-2 by FDA under an Emergency Use Authorization (EUA). This EUA will remain  in effect (meaning this test can be used) for the duration of the COVID-19 declaration under Section 564(b)(1) of the Act, 21 U.S.C.section 360bbb-3(b)(1), unless the authorization is terminated  or revoked sooner.       Influenza A by PCR NEGATIVE NEGATIVE Final   Influenza B by PCR NEGATIVE NEGATIVE Final    Comment: (NOTE) The Xpert Xpress SARS-CoV-2/FLU/RSV plus assay is intended as an aid in the diagnosis of influenza from Nasopharyngeal swab specimens and should not be used as a sole basis for treatment. Nasal washings and aspirates are unacceptable for Xpert Xpress SARS-CoV-2/FLU/RSV testing.  Fact Sheet for Patients: EntrepreneurPulse.com.au  Fact Sheet for Healthcare Providers: IncredibleEmployment.be  This test is not yet approved or cleared by the [place] FDA and has been authorized for detection and/or diagnosis of SARS-CoV-2 by FDA under an Emergency Use Authorization (EUA). This EUA will  remain in effect (meaning this test can be used) for the duration of the COVID-19 declaration under Section 564(b)(1) of the Act, 21 U.S.C. section 360bbb-3(b)(1), unless the authorization is terminated or revoked.  Performed at [facility], [address] [place], [place] [address]   Blood Culture (routine x 2)     Status: None (Preliminary result)   Collection Time: 12/09/20  9:45 PM   Specimen: BLOOD  Result Value Ref Range Status   Specimen Description BLOOD RIGHT ASSIST CONTROL  Final   Special Requests   Final    BOTTLES DRAWN AEROBIC AND ANAEROBIC Blood Culture results may not be optimal due to an inadequate volume of blood received in culture bottles   Culture   Final  NO GROWTH 2 DAYS Performed at [facility], [address] [place], [place] [address]    Report Status PENDING  Incomplete  Blood Culture (routine x 2)     Status: None (Preliminary result)   Collection Time: 12/09/20  9:45 PM   Specimen: BLOOD  Result Value Ref Range Status   Specimen Description BLOOD RIGHT ASSIST CONTROL  Final   Special Requests   Final    BOTTLES DRAWN AEROBIC AND ANAEROBIC Blood Culture results may not be optimal due to an inadequate volume of blood received in culture bottles   Culture   Final    NO GROWTH 2 DAYS Performed at [facility], [address], [place], [place] [address]    Report Status PENDING  Incomplete  Urine Culture     Status: Abnormal   Collection Time: 12/10/20  5:48 AM   Specimen: In/Out Cath Urine  Result Value Ref Range Status   Specimen Description   Final    IN/OUT CATH URINE Performed at [facility], [address], [place], [place] [address]    Special Requests   Final    NONE Performed at [facility], [address], [place], [place] [address]    Culture (A)  Final    <10,000 COLONIES/mL INSIGNIFICANT GROWTH Performed at [facility], [place] [address] [place], [place] [address]     Report Status 12/11/2020 FINAL  Final     Labs: BNP (last 3 results) Recent Labs    05/28/20 1700  BNP 29.7   Basic Metabolic Panel: Recent Labs  Lab 12/09/20 2003 12/10/20 0152 12/11/20 0342  NA 136 135 138  K 4.5 4.5 3.9  CL 100 102 107  CO2 23 21* 28  GLUCOSE 152* 128* 105*  BUN 30* 32* 30*  CREATININE 1.75* 1.60* 0.84  CALCIUM 8.5* 7.8* 7.6*  MG  --   --  1.5*  PHOS  --   --  3.0   Liver Function Tests: Recent Labs  Lab 12/09/20 2003 12/10/20 0844 12/11/20 0342  AST 137* 153* 112*  ALT 30 35 35  ALKPHOS 48 43 43  BILITOT 1.3* 1.0 0.8  PROT 7.8 6.3* 6.4*  ALBUMIN 3.4* 2.7* 2.7*   No results for input(s): LIPASE, AMYLASE in the last 168 hours. No results for input(s): AMMONIA in the last 168 hours. CBC: Recent Labs  Lab 12/09/20 2003 12/10/20 0152 12/11/20 0342  WBC 20.6* 15.7* 8.7  NEUTROABS 17.5*  --  7.3  HGB 12.7 12.2 9.3*  HCT 39.0 38.2 29.7*  MCV 91.5 94.3 90.5  PLT 213 160 159   Cardiac Enzymes: Recent Labs  Lab 12/09/20 2003 12/10/20 0844  CKTOTAL 6,349* 3,940*   BNP: Invalid input(s): POCBNP CBG: Recent Labs  Lab 12/10/20 2120 12/11/20 0733 12/11/20 1207  GLUCAP 75 116* 160*   D-Dimer No results for input(s): DDIMER in the last 72 hours. Hgb A1c Recent Labs    12/11/20 0342  HGBA1C 6.5*   Lipid Profile No results for input(s): CHOL, HDL, LDLCALC, TRIG, CHOLHDL, LDLDIRECT in the last 72 hours. Thyroid function studies No results for input(s): TSH, T4TOTAL, T3FREE, THYROIDAB in the last 72 hours.  Invalid input(s): FREET3 Anemia work up Recent Labs    12/11/20 0342  VITAMINB12 208  FOLATE 13.8  FERRITIN 187  TIBC 252  IRON 15*  RETICCTPCT 1.2   Urinalysis    Component Value Date/Time   COLORURINE YELLOW (A) 12/10/2020 0548   APPEARANCEUR CLOUDY (  A) 12/10/2020 0548   APPEARANCEUR Hazy 11/02/2011 1708   LABSPEC 1.017 12/10/2020 0548   LABSPEC 1.025 11/02/2011 1708   PHURINE 5.0 12/10/2020 0548   GLUCOSEU  NEGATIVE 12/10/2020 0548   GLUCOSEU 50 mg/dL 11/02/2011 1708   HGBUR LARGE (A) 12/10/2020 0548   HGBUR negative 11/26/2009 1200   BILIRUBINUR NEGATIVE 12/10/2020 0548   BILIRUBINUR 1 mg/dL 07/17/2020 1543   BILIRUBINUR Negative 11/02/2011 1708   KETONESUR 5 (A) 12/10/2020 0548   PROTEINUR 30 (A) 12/10/2020 0548   UROBILINOGEN 0.2 07/17/2020 1543   UROBILINOGEN 0.2 11/26/2009 1200   NITRITE NEGATIVE 12/10/2020 0548   LEUKOCYTESUR SMALL (A) 12/10/2020 0548   LEUKOCYTESUR Trace 11/02/2011 1708   Sepsis Labs Invalid input(s): PROCALCITONIN,  WBC,  LACTICIDVEN Microbiology Recent Results (from the past 240 hour(s))  Resp Panel by RT-PCR (Flu A&B, Covid) Nasopharyngeal Swab     Status: None   Collection Time: 12/09/20  9:45 PM   Specimen: Nasopharyngeal Swab; Nasopharyngeal(NP) swabs in vial transport medium  Result Value Ref Range Status   SARS Coronavirus 2 by RT PCR NEGATIVE NEGATIVE Final    Comment: (NOTE) SARS-CoV-2 target nucleic acids are NOT DETECTED.  The SARS-CoV-2 RNA is generally detectable in upper respiratory specimens during the acute phase of infection. The lowest concentration of SARS-CoV-2 viral copies this assay can detect is 138 copies/mL. A negative result does not preclude SARS-Cov-2 infection and should not be used as the sole basis for treatment or other patient management decisions. A negative result may occur with  improper specimen collection/handling, submission of specimen other than nasopharyngeal swab, presence of viral mutation(s) within the areas targeted by this assay, and inadequate number of viral copies(<138 copies/mL). A negative result must be combined with clinical observations, patient history, and epidemiological information. The expected result is Negative.  Fact Sheet for Patients:  EntrepreneurPulse.com.au  Fact Sheet for Healthcare Providers:  IncredibleEmployment.be  This test is no t yet  approved or cleared by the [place] FDA and  has been authorized for detection and/or diagnosis of SARS-CoV-2 by FDA under an Emergency Use Authorization (EUA). This EUA will remain  in effect (meaning this test can be used) for the duration of the COVID-19 declaration under Section 564(b)(1) of the Act, 21 U.S.C.section 360bbb-3(b)(1), unless the authorization is terminated  or revoked sooner.       Influenza A by PCR NEGATIVE NEGATIVE Final   Influenza B by PCR NEGATIVE NEGATIVE Final    Comment: (NOTE) The Xpert Xpress SARS-CoV-2/FLU/RSV plus assay is intended as an aid in the diagnosis of influenza from Nasopharyngeal swab specimens and should not be used as a sole basis for treatment. Nasal washings and aspirates are unacceptable for Xpert Xpress SARS-CoV-2/FLU/RSV testing.  Fact Sheet for Patients: EntrepreneurPulse.com.au  Fact Sheet for Healthcare Providers: IncredibleEmployment.be  This test is not yet approved or cleared by the [place] FDA and has been authorized for detection and/or diagnosis of SARS-CoV-2 by FDA under an Emergency Use Authorization (EUA). This EUA will remain in effect (meaning this test can be used) for the duration of the COVID-19 declaration under Section 564(b)(1) of the Act, 21 U.S.C. section 360bbb-3(b)(1), unless the authorization is terminated or revoked.  Performed at [facility], [address] [place], [place] [address]   Blood Culture (routine x 2)     Status: None (Preliminary result)   Collection Time: 12/09/20  9:45 PM   Specimen: BLOOD  Result Value Ref Range Status   Specimen Description BLOOD RIGHT  ASSIST CONTROL  Final   Special Requests   Final    BOTTLES DRAWN AEROBIC AND ANAEROBIC Blood Culture results may not be optimal due to an inadequate volume of blood received in culture bottles   Culture   Final    NO GROWTH 2 DAYS Performed at [facility], [address] [place], [place] [address]    Report Status PENDING  Incomplete  Blood Culture (routine x 2)     Status: None (Preliminary result)   Collection Time: 12/09/20  9:45 PM   Specimen: BLOOD  Result Value Ref Range Status   Specimen Description BLOOD RIGHT ASSIST CONTROL  Final   Special Requests   Final    BOTTLES DRAWN AEROBIC AND ANAEROBIC Blood Culture results may not be optimal due to an inadequate volume of blood received in culture bottles   Culture   Final    NO GROWTH 2 DAYS Performed at [facility], [address], [place], [place] [address]    Report Status PENDING  Incomplete  Urine Culture     Status: Abnormal   Collection Time: 12/10/20  5:48 AM   Specimen: In/Out Cath Urine  Result Value Ref Range Status   Specimen Description   Final    IN/OUT CATH URINE Performed at [facility], [address] [place], [place] [address]    Special Requests   Final    NONE Performed at [facility], [address], [place], [place] [address]    Culture (A)  Final    <10,000 COLONIES/mL INSIGNIFICANT GROWTH Performed at [facility], [place] [address], [place], [place] [address]    Report Status 12/11/2020 FINAL  Final     Time coordinating discharge: Over 30 minutes  SIGNED:   Mykelti Goldenstein J [place] DO  [facility] 12/11/2020, 12:17 PM

## 2020-12-11 NOTE — Progress Notes (Signed)
PT Cancellation Note  Patient Details Name: Suzanne Strong MRN: [id number] DOB: 1945/09/25   Cancelled Treatment:    Reason Eval/Treat Not Completed: Patient declined, no reason specified. Pt received upright on recliner. Reports about to d/c back home. Does not have any questions or concerns with her mobility.   Salem Caster. Fairly IV, PT, DPT Physical Therapist- [facility]  12/11/2020, 2:27 PM

## 2020-12-11 NOTE — Progress Notes (Addendum)
Palliative:  Consult received and chart reviewed. Went to patient's room for goals of care discussion - she is dressed and anticipates leaving the hospital shortly. I introduced palliative care and described reasoning for goals of care discussion - she is open to this but does not want to delay discharge for this discussion. She is agreeable to outpatient palliative referral. All questions addressed.  Referral for outpatient palliative given to liaison Loren Racer.   Juel Burrow, DNP, AGNP-C Palliative Medicine Team Team Phone # [phone]  Pager # [phone]  NO CHARGE

## 2020-12-11 NOTE — Evaluation (Signed)
Occupational Therapy Evaluation Patient Details Name: Suzanne Strong MRN: [id number] DOB: 19-Jan-1946 Today's Date: 12/11/2020   History of Present Illness Patient is a 75 y.o. female with medical history significant of hypertension, hyperlipidemia, diabetes mellitus, GERD, depression, anxiety, tonsillar cancer, RLS, left breast cancer (s/p of lumpectomy, radiation and chemotherapy), gastroparesis, anemia,  who presents to ED for fever and chills. Patient went to bathroom but felt too weak to get back into bed so laid on the gorund until her friend came at 2 pm due to them being worried she wouldn't answer her calls. Friend called EMS. Patient was found to be septic at ED.   Clinical Impression   Patient presenting with decreased Ind in self care, balance, functional mobility/transfers, endurance, and safety awareness. Patient reports living at home independently and alone PTA. She works part time at [employer] and performs all ADL and IADL tasks independently. Pt performing tasks within the room without use of AD at supervision level and no LOB this session. Patient will benefit from acute OT to increase overall independence in the areas of ADLs, functional mobility, and safety awareness in order to safely discharge home.      Recommendations for follow up therapy are one component of a multi-disciplinary discharge planning process, led by the attending physician.  Recommendations may be updated based on patient status, additional functional criteria and insurance authorization.   Follow Up Recommendations  Home health OT    Assistance Recommended at Discharge None  Functional Status Assessment  Patient has had a recent decline in their functional status and demonstrates the ability to make significant improvements in function in a reasonable and predictable amount of time.  Equipment Recommendations  None recommended by OT       Precautions / Restrictions Precautions Precautions:  Fall Precaution Comments: due to PVC's and RR      Mobility Bed Mobility Overal bed mobility: Independent             General bed mobility comments: no physical assist needed    Transfers Overall transfer level: Needs assistance Equipment used: None Transfers: Sit to/from Stand Sit to Stand: Supervision                  Balance Overall balance assessment: Needs assistance Sitting-balance support: No upper extremity supported;Feet unsupported Sitting balance-Leahy Scale: Good     Standing balance support: No upper extremity supported;During functional activity Standing balance-Leahy Scale: Fair                             ADL either performed or assessed with clinical judgement   ADL Overall ADL's : Needs assistance/impaired     Grooming: Wash/dry hands;Wash/dry face;Standing;Supervision/safety                   Toilet Transfer: Warehouse manager and Hygiene: Supervision/safety       Functional mobility during ADLs: Supervision/safety       Vision Patient Visual Report: No change from baseline              Pertinent Vitals/Pain Pain Assessment: No/denies pain     Hand Dominance Right   Extremity/Trunk Assessment Upper Extremity Assessment Upper Extremity Assessment: Overall WFL for tasks assessed   Lower Extremity Assessment Lower Extremity Assessment: Overall WFL for tasks assessed   Cervical / Trunk Assessment Cervical / Trunk Assessment: Normal   Communication Communication Communication: No difficulties   Cognition  Arousal/Alertness: Awake/alert Behavior During Therapy: WFL for tasks assessed/performed Overall Cognitive Status: Within Functional Limits for tasks assessed                                 General Comments: Patient A and O x 4.  .                Home Living Family/patient expects to be discharged to:: Private residence Living  Arrangements: Alone Available Help at Discharge: Family;Friend(s) Type of Home: House Home Access: Level entry     Home Layout: One level     Bathroom Shower/Tub: Walk-in shower         Home Equipment: Shower seat;Grab bars - tub/shower   Additional Comments: Patient is Ind at baseline      Prior Functioning/Environment Prior Level of Function : Independent/Modified Independent             Mobility Comments: Patient ambulates without AD at baseline ADLs Comments: ind at baseline and works part time at [employer]        OT Problem List: Decreased strength;Decreased activity tolerance;Impaired balance (sitting and/or standing);Decreased safety awareness      OT Treatment/Interventions: Self-care/ADL training;Therapeutic exercise;Therapeutic activities;Energy conservation;DME and/or AE instruction;Patient/family education;Manual therapy;Balance training    OT Goals(Current goals can be found in the care plan section) Acute Rehab OT Goals Patient Stated Goal: to go home OT Goal Formulation: With patient Time For Goal Achievement: 12/25/20 Potential to Achieve Goals: Fair ADL Goals Pt Will Perform Grooming: Independently Pt Will Perform Lower Body Dressing: Independently Pt Will Transfer to Toilet: Independently Pt Will Perform Toileting - Clothing Manipulation and hygiene: Independently  OT Frequency: Min 2X/week   Barriers to D/C:    none known at this time          AM-PAC OT "6 Clicks" Daily Activity     Outcome Measure Help from another person eating meals?: None Help from another person taking care of personal grooming?: None Help from another person toileting, which includes using toliet, bedpan, or urinal?: None Help from another person bathing (including washing, rinsing, drying)?: None Help from another person to put on and taking off regular upper body clothing?: None Help from another person to put on and taking off regular lower body clothing?: None 6  Click Score: 24   End of Session Nurse Communication: Mobility status  Activity Tolerance: Patient tolerated treatment well Patient left: in bed;with call bell/phone within reach  OT Visit Diagnosis: Unsteadiness on feet (R26.81);Muscle weakness (generalized) (M62.81)                Time: 1010-1030 OT Time Calculation (min): 20 min Charges:  OT General Charges $OT Visit: 1 Visit OT Evaluation $OT Eval Low Complexity: 1 Low OT Treatments $Self Care/Home Management : 8-22 mins  Darleen Crocker, MS, OTR/L , CBIS ascom [phone]  12/11/20, 1:01 PM

## 2020-12-12 ENCOUNTER — Telehealth: Payer: Self-pay | Admitting: Family Medicine

## 2020-12-12 ENCOUNTER — Telehealth: Payer: Self-pay

## 2020-12-12 NOTE — Telephone Encounter (Signed)
Spoke with Marzetta Board with [facility] and advised of the agreement. They just need a verbal. Per Marzetta Board the order came from the hospital and it looks like per notes from them that patient was d/c home.

## 2020-12-12 NOTE — Telephone Encounter (Signed)
That is fine, I think she was discharged- unsure if she is at home or in a rehab center.  Do they need an order?

## 2020-12-12 NOTE — Telephone Encounter (Signed)
Transition Care Management Unsuccessful Follow-up Telephone Call  Date of discharge and from where:  12/11/20 from [facility]  Attempts:  1st Attempt  Reason for unsuccessful TCM follow-up call:  Left voice message  TM

## 2020-12-12 NOTE — Telephone Encounter (Signed)
Suzanne Strong from [facility] called in stated pt needs approval for palliative care . Would like a call back # [phone]  option #2

## 2020-12-13 ENCOUNTER — Telehealth: Payer: Self-pay | Admitting: Nurse Practitioner

## 2020-12-13 ENCOUNTER — Telehealth: Payer: Self-pay

## 2020-12-13 NOTE — Telephone Encounter (Signed)
Transition Care Management Follow-up Telephone Call Date of discharge and from where: 12/11/20 from [facility]. How have you been since you were released from the hospital? Patient states she is well. Any questions or concerns? No  Items Reviewed: Did the pt receive and understand the discharge instructions provided? Yes  Medications obtained and verified? Yes  Other? No  Any new allergies since your discharge? No  Dietary orders reviewed? Yes Do you have support at home? No  , Patient is states she is widowed and has a friend that comes to help.  Home Care and Equipment/Supplies: Were home health services ordered? no If so, what is the name of the agency? N/A  Has the agency set up a time to come to the patient's home? not applicable Were any new equipment or medical supplies ordered?  No What is the name of the medical supply agency? N/A Were you able to get the supplies/equipment? not applicable Do you have any questions related to the use of the equipment or supplies? No  Functional Questionnaire: (I = Independent and D = Dependent) ADLs: I  Bathing/Dressing- I  Meal Prep- I  Eating- I  Maintaining continence- I  Transferring/Ambulation- I  Managing Meds- I  Follow up appointments reviewed:  PCP Hospital f/u appt confirmed? Yes  Scheduled to see Dr. Jerilynn Mages.Tower on 12/20/20 @ 11:30am. [facility] f/u appt confirmed? No  , Patient to F/U with PCP Are transportation arrangements needed? No  If their condition worsens, is the pt aware to call PCP or go to the Emergency Dept.? Yes Was the patient provided with contact information for the PCP's office or ED? Yes Was to pt encouraged to call back with questions or concerns? Yes

## 2020-12-13 NOTE — Telephone Encounter (Signed)
Attempted to contact patient to offer to schedule a Palliative Consult in the home, no answer - left message with reason for call along with my name and call back number.

## 2020-12-14 LAB — CULTURE, BLOOD (ROUTINE X 2)
Culture: NO GROWTH
Culture: NO GROWTH

## 2020-12-20 ENCOUNTER — Telehealth (INDEPENDENT_AMBULATORY_CARE_PROVIDER_SITE_OTHER): Payer: PPO | Admitting: Family Medicine

## 2020-12-20 ENCOUNTER — Telehealth: Payer: Self-pay | Admitting: Nurse Practitioner

## 2020-12-20 ENCOUNTER — Telehealth: Payer: Self-pay | Admitting: *Deleted

## 2020-12-20 ENCOUNTER — Encounter: Payer: Self-pay | Admitting: Family Medicine

## 2020-12-20 ENCOUNTER — Other Ambulatory Visit: Payer: Self-pay

## 2020-12-20 DIAGNOSIS — M6282 Rhabdomyolysis: Secondary | ICD-10-CM | POA: Insufficient documentation

## 2020-12-20 DIAGNOSIS — T796XXS Traumatic ischemia of muscle, sequela: Secondary | ICD-10-CM

## 2020-12-20 DIAGNOSIS — R1310 Dysphagia, unspecified: Secondary | ICD-10-CM | POA: Diagnosis not present

## 2020-12-20 DIAGNOSIS — E538 Deficiency of other specified B group vitamins: Secondary | ICD-10-CM | POA: Diagnosis not present

## 2020-12-20 DIAGNOSIS — E119 Type 2 diabetes mellitus without complications: Secondary | ICD-10-CM

## 2020-12-20 DIAGNOSIS — J690 Pneumonitis due to inhalation of food and vomit: Secondary | ICD-10-CM

## 2020-12-20 DIAGNOSIS — I10 Essential (primary) hypertension: Secondary | ICD-10-CM

## 2020-12-20 DIAGNOSIS — D509 Iron deficiency anemia, unspecified: Secondary | ICD-10-CM

## 2020-12-20 NOTE — Telephone Encounter (Signed)
LMTCB to schedule lab appt for [facility]

## 2020-12-20 NOTE — Assessment & Plan Note (Addendum)
After lying on floor with pna/sepsis Doing better now Reviewed hospital records, lab results and studies in detail  Renal dysfunction cleared Planning labs at the [facility] office for LFTs, ck , renal

## 2020-12-20 NOTE — Assessment & Plan Note (Signed)
bp in fair control at this time  BP Readings from Last 1 Encounters:  12/11/20 109/73  bp is stable after  No changes needed Most recent labs reviewed

## 2020-12-20 NOTE — Assessment & Plan Note (Signed)
Stable Lab Results  Component Value Date   HGBA1C 6.5 (H) 12/11/2020

## 2020-12-20 NOTE — Addendum Note (Signed)
Addended by: [name] A on: 12/20/2020 01:22 PM   Modules accepted: Orders

## 2020-12-20 NOTE — Assessment & Plan Note (Signed)
Lab Results  Component Value Date   VITAMINB12 208 12/11/2020

## 2020-12-20 NOTE — Assessment & Plan Note (Signed)
Stable in hospital Labs ordered  Continues iron suppl

## 2020-12-20 NOTE — Patient Instructions (Addendum)
I want to schedule labs when we can (we will call you)  Rest and stay hydrated  Follow swallowing precautions If suddenly worse alert [place] of go to ER (especially if short of breath or fever)   Check in with your friend frequently   Let us know if you want home care or physical therapy

## 2020-12-20 NOTE — Assessment & Plan Note (Signed)
Recurrent asp pneumonia  Pt uses care,thickens liquids Declines G tube

## 2020-12-20 NOTE — Telephone Encounter (Signed)
Please call her to schedule labs at [facility] station  Orders are in (doesn't have to fast) Thanks

## 2020-12-20 NOTE — Assessment & Plan Note (Addendum)
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

## 2020-12-20 NOTE — Telephone Encounter (Signed)
Attempted to contact patient on her home #, to offer to schedule an In-home Palliative Consult, with no answer and no VM.  I also attemted to contact patient on her cell with no answer and unable to leave a message due to mailbox was full.  I then attempted to contact patient's listed friend, Amelia Jo, with no answer and left message with reason for call and asked her to let the patient know that we are trying to get in contact with her to see if she is interested in starting Palliative services with [place] and to schedule a visit.  I left my name and contact information requesting a return call.

## 2020-12-20 NOTE — Progress Notes (Signed)
Virtual Visit via Video Note  I connected with Suzanne Strong on 12/20/20 at 11:30 AM EDT by a video enabled telemedicine application and verified that I am speaking with the correct person using two identifiers.  Location: Patient: home Provider: office   I discussed the limitations of evaluation and management by telemedicine and the availability of in person appointments. The patient expressed understanding and agreed to proceed.  Parties involved in encounter  Patient: Suzanne Strong  Provider:  [name] MD   History of Present Illness: Pt presents for f/u of hospitalization for pna (aspiration)  Pt was hospitalized from 10/24 to 12/01/20 for pneumonia  She declined home health services at the time of d/c   She presented on the day of admission to [facility] [place] ED with fever,chills and sob  Noted weakness for several days then fever 101.8 She laid on the floor of bathroom after becoming suddenly weak  Could not get back in bed (high bed)- had scabs on elbows and knees from trying to get up in bed  Then became confused and EMS was called  Found to be septic with elevated CK and wbc 20.6 Cxr showed LLL consolidation  Tx with IV fluids and cefepime, vanc and flagyl   Dx with encephalopathy 2nd to CAP vs aspiration pna and rhabdomyolysis   Hospital course Acute metabolic encephalopathy, POA: Resolved Patient presenting to the ED via EMS after being found confused by friend.  Etiology likely secondary to acute infectious process with pneumonia.  Patient's mental status now back at her normal baseline following treatment as below.  Sepsis, POA Community acquired versus aspiration pneumonia Acute respiratory failure with hypoxia, POA: Resolved Lactic acidosis, POA Patient met sepsis criteria on admission with tachypnea, fever, leukocytosis in the setting of end organ damage with acute metabolic encephalopathy, acute renal failure,  with source of infection identified within her  lungs with left lower lobe consolidation.  Influenza A/B PCR negative.  COVID-19 PCR negative.  Lactic acid 3.1.  Patient started on empiric antibiotics with IV vancomycin/cefepime/Flagyl and transition to azithromycin and ceftriaxone.  WBC count improved from 20.6 to 8.7 at time of discharge.  Blood cultures x2 showed no growth x2 days while during hospitalization.  Urine culture with insignificant growth.  Patient was titrated off of supplemental oxygen.  We will continue azithromycin and cefdinir to complete antibiotic course after discharge.  Discussed with patient's regarding aspiration precautions at home.  She was d/c on cefdinir and azithromycin  Last lab Lab Results  Component Value Date   WBC 8.7 12/11/2020   HGB 9.3 (L) 12/11/2020   HCT 29.7 (L) 12/11/2020   MCV 90.5 12/11/2020   PLT 159 12/11/2020      Acute renal failure: Resolved Patient's creatinine trended up to high 1.75, started on IV fluid hydration with improvement of creatinine to 0.84 at time of discharge.   Lab Results  Component Value Date   CREATININE 0.84 12/11/2020   BUN 30 (H) 12/11/2020   NA 138 12/11/2020   K 3.9 12/11/2020   CL 107 12/11/2020   CO2 28 12/11/2020     Acute rhabdomyolysis Troponin peaked at 574.  Patient was treated with IV fluid hydration.  Patient symptoms of muscle aches resolved.  Lab Results  Component Value Date   CKTOTAL 3,940 (H) 12/10/2020      Elevated troponin High sensitive troponin elevated at 562, followed by 574, and 389.  Etiology likely secondary to type II demand ischemia in the setting of sepsis  and rhabdomyolysis as above.  Chest pain-free.  Not indicative of acute coronary syndrome.     Anxiety/depression Continue sertraline, buspirone, alprazolam, trazodone nightly.   HLD: Resume statin on discharge.   Elevated LFTs Etiology likely secondary to sepsis/rhabdomyolysis as above.  LFTs improved.  Repeat CMP 1 week.   Lab Results  Component Value Date    ALT 35 12/11/2020   AST 112 (H) 12/11/2020   ALKPHOS 43 12/11/2020   BILITOT 0.8 12/11/2020   No abd pain  No focal weakness No fever  Appetite is ok    Iron deficiency anemia Hemoglobin 9.3 at time of discharge.  Continue ferrous sulfate.  Lab Results  Component Value Date   WBC 8.7 12/11/2020   HGB 9.3 (L) 12/11/2020   HCT 29.7 (L) 12/11/2020   MCV 90.5 12/11/2020   PLT 159 12/11/2020   Lab Results  Component Value Date   IRON 15 (L) 12/11/2020   TIBC 252 12/11/2020   FERRITIN 187 12/11/2020   Lab Results  Component Value Date   VITAMINB12 208 12/11/2020      Type 2 diabetes mellitus Hemoglobin A1c 6.5, well controlled.  Continue home Sitagliptin-metformin 50-1000 mg p.o. twice daily.  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  Outpatient follow-up with oncology.   Restless leg syndrome: Ropinirole   GERD: Continue PPI   Obesity Body mass index is 33.25 kg/m.  Discussed with patient needs for aggressive lifestyle changes/weight loss as this complicates all facets of care.  Outpatient follow-up with PCP.  May benefit from bariatric evaluation outpatient.  Dyphagia -still frustrates her  Numerous swallowing studies  She thickens liquids Does not want a feeding tube      Today  Doing better overall  Had to use inhaler more yesterday  Not today-better   Scent cough -not brining anything up  Finished the antibiotics  Occ pain behind shoulder /shoulder blade area on the L  Improved from when she came home   Swallowing is good now/no choking  Is carefully   No help at home, does not feel she needs it  No 02   Does not feel like she needs palliative care at this time  A friend checks in on her frequently  She found her -comes over if she does not answer the phone  Has life alert   She is still working  also  Not back yet due to fatigue   Making gradual progress in level of fatigue  Can do more each day Doing laundry and prepping food and bathing  Has shower chair   Thinks she bounced back pretty quick   Feet swell a little/no change in that  No changes in weight    Patient Active Problem List   Diagnosis Date Noted   Rhabdomyolysis 12/20/2020   Aspiration pneumonia ([place]) 12/09/2020   Dysuria 07/17/2020   Dysphagia    Gastric erythema    Gastric polyp    Encounter for screening colonoscopy    Polyp of colon    History of aspiration pneumonia 02/15/2020   Diabetes mellitus without complication ([place])    GERD (gastroesophageal reflux disease)    Hypertension    Urinary urgency 01/05/2020   Grief reaction 09/18/2019   Swallowing dysfunction 07/18/2019   Cough 02/21/2019   Fever, intermittent 09/01/2018   Pre-syncope 02/07/2018   Orthostatic hypotension  01/26/2018   Episodic weakness 01/26/2018   Medicare annual wellness visit, subsequent 06/29/2017   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   Preoperative examination 04/27/2017   Carpal tunnel syndrome of right wrist 11/13/2016   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/2016   Obstructive sleep apnea 01/01/2016   Carcinoma of upper-outer quadrant of left breast in female, estrogen receptor negative ([place]) 11/21/2015   Hypomagnesemia 08/28/2015   Fever 04/11/2015   Initial Medicare annual wellness visit 10/16/2014   Estrogen deficiency 10/16/2014   Colon cancer screening 10/16/2014   Controlled type 2 diabetes mellitus without complication ([place]) 97/98/9211   RLS (restless legs syndrome) 02/21/2014   Chronic neck pain 07/25/2010   Depression with anxiety 07/07/2010   Routine general medical examination at a health care facility 06/26/2010   B12 deficiency 09/04/2009   Anemia, iron deficiency 08/29/2009   Anxiety state 08/26/2009   GASTROPARESIS 08/26/2009   Vitamin D deficiency 07/19/2008   Disorder of  bone and cartilage 07/19/2007   Hyperlipidemia 07/18/2007   EDEMA 07/18/2007   Skin lesion, superficial 06/02/2007   Past Medical History:  Diagnosis Date   Anemia    iron deficiency and B12 deficiency   Anxiety    Breast cancer ([place]) 02/25/2015   upper-outer quadrant of left female breast, Triple Negative. Neo-adjuvant chemo with complete pathologic response.    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   BREAST CYST ASPIRATION     BREAST EXCISIONAL BIOPSY Left 12/2015   surgery   BREAST LUMPECTOMY WITH NEEDLE LOCALIZATION Left 10/02/2015   Procedure: BREAST LUMPECTOMY WITH NEEDLE LOCALIZATION;  Surgeon: [name], MD;  Location: [facility];  Service: General;  Laterality: Left;   BREAST LUMPECTOMY WITH SENTINEL LYMPH NODE BIOPSY Left 10/02/2015   Procedure: LEFT BREAST WIDE EXCISION WITH SENTINEL LYMPH NODE BX;  Surgeon: [name], MD;  Location: [facility];  Service: General;  Laterality: Left;   BREAST SURGERY     breast biopsy benign   CARPAL TUNNEL RELEASE Right 05/19/2017   Procedure: CARPAL TUNNEL RELEASE;  Surgeon: Earnestine Leys, MD;  Location: [facility];  Service: Orthopedics;  Laterality: Right;   CATARACT EXTRACTION W/PHACO Right 10/26/2018   Procedure: CATARACT EXTRACTION PHACO AND INTRAOCULAR LENS PLACEMENT ([place]) right diabetic;   Surgeon: Leandrew Koyanagi, MD;  Location: [place];  Service: Ophthalmology;  Laterality: Right;  Diabetic - oral meds cancer center been notified and will come   CATARACT EXTRACTION W/PHACO Left 11/16/2018   Procedure: CATARACT EXTRACTION PHACO AND INTRAOCULAR LENS PLACEMENT ([facility]) LEFT DIABETIC  01:01.0  17.0%  10.37;  Surgeon: Leandrew Koyanagi, MD;  Location: [place];  Service: Ophthalmology;  Laterality: Left;  Diabetic - oral meds Port-a-cath   CHOLECYSTECTOMY  Cologuard  07/22/2016   Negative   COLONOSCOPY WITH PROPOFOL N/A 05/24/2020   Procedure: COLONOSCOPY WITH PROPOFOL;  Surgeon: Virgel Manifold, MD;  Location: [facility];  Service: Endoscopy;  Laterality: N/A;   ESOPHAGOGASTRODUODENOSCOPY  07/2009   normal with few gastric polyps   ESOPHAGOGASTRODUODENOSCOPY (EGD) WITH PROPOFOL N/A 05/24/2020   Procedure: ESOPHAGOGASTRODUODENOSCOPY (EGD) WITH PROPOFOL;  Surgeon: Virgel Manifold, MD;  Location: [facility];  Service: Endoscopy;  Laterality: N/A;   MOLE REMOVAL     PORT-A-CATH REMOVAL     PORTACATH PLACEMENT     PORTACATH PLACEMENT Right 03/12/2015   Procedure: INSERTION PORT-A-CATH;  Surgeon: [name], MD;  Location: [facility];  Service: General;  Laterality: Right;   RADICAL NECK DISSECTION     TONSILLECTOMY     cancer treated with chemo and radiation   TRIGGER FINGER RELEASE Right 05/19/2017   Procedure: RELEASE TRIGGER FINGER/A-1 PULLEY ring finger;  Surgeon: Earnestine Leys, MD;  Location: [facility];  Service: Orthopedics;  Laterality: Right;   Social History   Tobacco Use   Smoking status: Former    Packs/day: 0.50    Years: 6.00    Pack years: 3.00    Types: Cigarettes    Quit date: 03/10/1984    Years since quitting: 36.8   Smokeless tobacco: Never  Vaping Use   Vaping Use: Never used  Substance Use Topics   Alcohol use: No    Alcohol/week: 0.0 standard drinks   Drug use: No   Family History  Problem Relation  Age of Onset   Osteoporosis Mother    Hyperlipidemia Mother    Depression Mother    Cancer Mother        skin cancer ? basal cell and lung ca heavy smoker   Alcohol abuse Father    Cancer Father        skin CA ? basal cell   Heart disease Father        CHF   Hyperlipidemia Brother    Hypertension Brother    Breast cancer Neg Hx    Allergies  Allergen Reactions   Amoxicillin Swelling    REACTION: rash, swelling Has patient had a PCN reaction causing immediate rash, facial/tongue/throat swelling, SOB or lightheadedness with hypotension: yes Has patient had a PCN reaction causing severe rash involving mucus membranes or skin necrosis: no Has patient had a PCN reaction that required hospitalization: no Has patient had a PCN reaction occurring within the last 10 years: yes If all of the above answers are "NO", then may proceed with Cephalosporin use.  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     albuterol (VENTOLIN HFA) 108 (90 Base) MCG/ACT inhaler INHALE TWO PUFFS BY MOUTH EVERY SIX HOURS AS NEEDED 8.5 g 3   ALPRAZolam (XANAX) 0.5 MG tablet Take ONE tablet by MOUTH twice daily as needed FOR anxiety OR SLEEP 60 tablet 5   amitriptyline (ELAVIL) 50 MG tablet Take 1 tablet (50  mg total) by mouth at bedtime. 90 tablet 3   amLODipine (NORVASC) 5 MG tablet Take 1 tablet (5 mg total) by mouth daily. 90 tablet 3   atorvastatin (LIPITOR) 10 MG tablet TAKE 1 TABLET(10 MG) BY MOUTH DAILY 90 tablet 3   b complex vitamins  tablet Take 1 tablet by mouth daily.     busPIRone (BUSPAR) 15 MG tablet Take 1 tablet in the morning and 1/2 tablet in the evening for one week, then increase to 1 tablet twice daily 180 tablet 1   Cholecalciferol (VITAMIN D-1000 MAX ST) 25 MCG (1000 UT) tablet Take 2,000 Units by mouth daily. Takes three 50 mcg tablets, 3 capsules daily     Emollient (COLLAGEN EX)      ferrous sulfate 325 (65 FE) MG tablet Take 1 tablet (325 mg total) by mouth 2 (two) times daily with a meal.     glipiZIDE (GLUCOTROL) 5 MG tablet Take 5 mg by mouth daily before breakfast.     glucose blood (ONETOUCH ULTRA) test strip USE TO check blood glucose ONCE DAILY AND AS NEEDED FOR DIABETES 100 strip 1   lidocaine-prilocaine (EMLA) cream Apply 1 application topically as needed. Apply to port when going through Chemo     MELATONIN PO Take 10 mg by mouth.     Multiple Vitamins-Minerals (WOMENS MULTIVITAMIN PO) Take by mouth.     omeprazole (PRILOSEC) 20 MG capsule TAKE ONE CAPSULE BY MOUTH EVERYDAY AT BEDTIME 90 capsule 3   pregabalin (LYRICA) 150 MG capsule Take 300 mg by mouth at bedtime.     rOPINIRole (REQUIP) 1 MG tablet TAKE 1/2 TABLET BY MOUTH EVERY MORNING and TAKE THREE TABLETS BY MOUTH EVERYDAY AT BEDTIME 315 tablet 5   sertraline (ZOLOFT) 100 MG tablet Take 2 tablets (200 mg total) by mouth every morning. 180 tablet 1   SitaGLIPtin-MetFORMIN HCl 50-1000 MG TB24 Take 1 tablet by mouth 2 (two) times daily.   0   Suvorexant (BELSOMRA) 20 MG TABS Take 20 mg by mouth at bedtime as needed. 15 tablet 0   traZODone (DESYREL) 50 MG tablet TAKE ONE TABLET BY MOUTH EVERYDAY AT BEDTIME 60 tablet 3   Current Facility-Administered Medications on File Prior to Visit  Medication Dose Route Frequency Provider Last Rate Last Admin   influenza vaccine adjuvanted (FLUAD) injection 0.5 mL  0.5 mL Intramuscular Once Charlaine Dalton R, MD       sodium chloride flush (NS) 0.9 % injection 10 mL  10 mL Intravenous PRN Cammie Sickle, MD   10 mL at 09/11/15 0845   Review of Systems  Constitutional:  Positive for malaise/fatigue. Negative for chills and fever.  HENT:  Negative for congestion, ear pain, sinus pain and sore throat.   Eyes:  Negative for blurred vision, discharge and redness.  Respiratory:  Positive for cough and sputum production. Negative for shortness of breath, wheezing and stridor.   Cardiovascular:  Negative for chest pain, palpitations and leg swelling.  Gastrointestinal:  Negative for abdominal pain, diarrhea, nausea and vomiting.  Musculoskeletal:  Negative for myalgias.  Skin:  Negative for rash.  Neurological:  Positive for weakness. Negative for dizziness and headaches.    Observations/Objective: Patient appears well, in no distress Weight is baseline  No facial swelling or asymmetry Baseline hoarse voice  No obvious tremor or mobility impairment Moving neck and UEs normally Able to hear the call well  Occ hacky cough. No audible wheeze. Not sob with speech Talkative and mentally  sharp with no cognitive changes No skin changes on face or neck , no rash or pallor Affect is normal    Assessment and Plan: Problem List Items Addressed This Visit       Cardiovascular and Mediastinum   Hypertension    bp in fair control at this time  BP Readings from Last 1 Encounters:  12/11/20 109/73  bp is stable after  No changes needed Most recent labs reviewed          Respiratory   Aspiration pneumonia ([place]) - Primary    Recurrent aspiration due to post radiation throat changes tx with IV abx in hospital and finished oral cefdinir and azithro at home Declines feeding tube Reviewed hospital records, lab results and studies in detail  Much clinical improvement so far, declines home care or PT Enc her to use care with eat/drink, thicken liquids  Rest as needed  Will need labs, plan at [facility] office        Endocrine   Controlled type 2 diabetes mellitus without  complication (HCC)    Stable Lab Results  Component Value Date   HGBA1C 6.5 (H) 12/11/2020           Musculoskeletal and Integument   Rhabdomyolysis    After lying on floor with pna/sepsis Doing better now Reviewed hospital records, lab results and studies in detail  Renal dysfunction cleared Planning labs at the [facility] office for LFTs, ck , renal        Other   B12 deficiency    Lab Results  Component Value Date   VITAMINB12 208 12/11/2020        Anemia, iron deficiency    Stable in hospital Labs ordered  Continues iron suppl      Swallowing dysfunction    Recurrent asp pneumonia  Pt uses care,thickens liquids Declines G tube           Follow Up Instructions: I want to schedule labs when we can (we will call you)  Rest and stay hydrated  Follow swallowing precautions If suddenly worse alert [place] of go to ER (especially if short of breath or fever)   Check in with your friend frequently   Let us know if you want home care or physical therapy   I discussed the assessment and treatment plan with the patient. The patient was provided an opportunity to ask questions and all were answered. The patient agreed with the plan and demonstrated an understanding of the instructions.   The patient was advised to call back or seek an in-person evaluation if the symptoms worsen or if the condition fails to improve as anticipated.     [name], MD

## 2020-12-23 NOTE — Telephone Encounter (Signed)
LVMTCB TO SCHEDULE LABS @ [facility]

## 2020-12-27 ENCOUNTER — Other Ambulatory Visit (INDEPENDENT_AMBULATORY_CARE_PROVIDER_SITE_OTHER): Payer: PPO

## 2020-12-27 ENCOUNTER — Other Ambulatory Visit: Payer: Self-pay

## 2020-12-27 DIAGNOSIS — T796XXA Traumatic ischemia of muscle, initial encounter: Secondary | ICD-10-CM

## 2020-12-27 DIAGNOSIS — D509 Iron deficiency anemia, unspecified: Secondary | ICD-10-CM | POA: Diagnosis not present

## 2020-12-27 DIAGNOSIS — I10 Essential (primary) hypertension: Secondary | ICD-10-CM

## 2020-12-27 DIAGNOSIS — T796XXS Traumatic ischemia of muscle, sequela: Secondary | ICD-10-CM

## 2020-12-27 LAB — COMPREHENSIVE METABOLIC PANEL
ALT: 9 U/L (ref 0–35)
AST: 18 U/L (ref 0–37)
Albumin: 3.5 g/dL (ref 3.5–5.2)
Alkaline Phosphatase: 77 U/L (ref 39–117)
BUN: 13 mg/dL (ref 6–23)
CO2: 30 mEq/L (ref 19–32)
Calcium: 8.4 mg/dL (ref 8.4–10.5)
Chloride: 102 mEq/L (ref 96–112)
Creatinine, Ser: 0.92 mg/dL (ref 0.40–1.20)
GFR: 61.97 mL/min (ref >60.00)
Glucose, Bld: 96 mg/dL (ref 70–99)
Potassium: 4.4 mEq/L (ref 3.5–5.1)
Sodium: 140 mEq/L (ref 135–145)
Total Bilirubin: 0.2 mg/dL (ref 0.2–1.2)
Total Protein: 7.1 g/dL (ref 6.0–8.3)

## 2020-12-27 LAB — CBC WITH DIFFERENTIAL/PLATELET
Basophils Absolute: 0.1 10*3/uL (ref 0.0–0.1)
Basophils Relative: 1.1 % (ref 0.0–3.0)
Eosinophils Absolute: 0.1 10*3/uL (ref 0.0–0.7)
Eosinophils Relative: 2.6 % (ref 0.0–5.0)
HCT: 31.8 % — ABNORMAL LOW (ref 36.0–46.0)
Hemoglobin: 10 g/dL — ABNORMAL LOW (ref 12.0–15.0)
Lymphocytes Relative: 22.7 % (ref 12.0–46.0)
Lymphs Abs: 1 10*3/uL (ref 0.7–4.0)
MCHC: 31.5 g/dL (ref 30.0–36.0)
MCV: 89.3 fl (ref 78.0–100.0)
Monocytes Absolute: 0.3 10*3/uL (ref 0.1–1.0)
Monocytes Relative: 6.7 % (ref 3.0–12.0)
Neutro Abs: 3.1 10*3/uL (ref 1.4–7.7)
Neutrophils Relative %: 66.9 % (ref 43.0–77.0)
Platelets: 316 10*3/uL (ref 150.0–400.0)
RBC: 3.56 Mil/uL — ABNORMAL LOW (ref 3.87–5.11)
RDW: 15.2 % (ref 11.5–15.5)
WBC: 4.6 10*3/uL (ref 4.0–10.5)

## 2020-12-27 LAB — CK: Total CK: 109 U/L (ref 7–177)

## 2020-12-29 ENCOUNTER — Other Ambulatory Visit: Payer: Self-pay | Admitting: Psychiatry

## 2020-12-29 DIAGNOSIS — F411 Generalized anxiety disorder: Secondary | ICD-10-CM

## 2021-01-01 ENCOUNTER — Telehealth: Payer: Self-pay

## 2021-01-01 ENCOUNTER — Other Ambulatory Visit: Payer: Self-pay | Admitting: Psychiatry

## 2021-01-01 DIAGNOSIS — F411 Generalized anxiety disorder: Secondary | ICD-10-CM

## 2021-01-01 NOTE — Chronic Care Management (AMB) (Addendum)
Chronic Care Management Pharmacy Assistant   Name: Suzanne Strong  MRN: [id number] DOB: August 26, 1945  Reason for Encounter: Medication Adherence and Delivery Coordination   Recent office visits:  12/20/20-PCP-Telemedicine-Patient presented for follow up after hospital visit for pneumonia.   Recent consult visits:  11/26/20-Oncology-Patient presented for follow up breast carcinoma.Flu shot given.follow up 6 months  Hospital visits:  12/09/20 thru 12/11/20-[facility]-Patient presented for confusion/fall. Labs ordered,Chest xray,The patient was given IV cefepime, vancomycin and Flagyl for broad-spectrum coverage initially, 1 L bolus of IV l normal saline followed 150 mill per hour of IV lactated Ringer.  She will be admitted to a medical monitored bed for further evaluation and management,hold Janumet XR.  Medications: Outpatient Encounter Medications as of 01/01/2021  Medication Sig   acetaminophen (TYLENOL) 325 MG tablet Take 2 tablets (650 mg total) by mouth every 6 (six) hours as needed for mild pain (or Fever >/= 101).   albuterol (VENTOLIN HFA) 108 (90 Base) MCG/ACT inhaler INHALE TWO PUFFS BY MOUTH EVERY SIX HOURS AS NEEDED   ALPRAZolam (XANAX) 0.5 MG tablet Take ONE tablet by MOUTH twice daily as needed FOR anxiety OR SLEEP   amitriptyline (ELAVIL) 50 MG tablet Take 1 tablet (50 mg total) by mouth at bedtime.   amLODipine (NORVASC) 5 MG tablet Take 1 tablet (5 mg total) by mouth daily.   atorvastatin (LIPITOR) 10 MG tablet TAKE 1 TABLET(10 MG) BY MOUTH DAILY   b complex vitamins tablet Take 1 tablet by mouth daily.   busPIRone (BUSPAR) 15 MG tablet Take 1 tablet in the morning and 1/2 tablet in the evening for one week, then increase to 1 tablet twice daily   Cholecalciferol (VITAMIN D-1000 MAX ST) 25 MCG (1000 UT) tablet Take 2,000 Units by mouth daily. Takes three 50 mcg tablets, 3 capsules daily   Emollient (COLLAGEN EX)    ferrous sulfate 325 (65 FE) MG tablet Take 1  tablet (325 mg total) by mouth 2 (two) times daily with a meal.   glipiZIDE (GLUCOTROL) 5 MG tablet Take 5 mg by mouth daily before breakfast.   glucose blood (ONETOUCH ULTRA) test strip USE TO check blood glucose ONCE DAILY AND AS NEEDED FOR DIABETES   lidocaine-prilocaine (EMLA) cream Apply 1 application topically as needed. Apply to port when going through Chemo   MELATONIN PO Take 10 mg by mouth.   Multiple Vitamins-Minerals (WOMENS MULTIVITAMIN PO) Take by mouth.   omeprazole (PRILOSEC) 20 MG capsule TAKE ONE CAPSULE BY MOUTH EVERYDAY AT BEDTIME   pregabalin (LYRICA) 150 MG capsule Take 300 mg by mouth at bedtime.   rOPINIRole (REQUIP) 1 MG tablet TAKE 1/2 TABLET BY MOUTH EVERY MORNING and TAKE THREE TABLETS BY MOUTH EVERYDAY AT BEDTIME   sertraline (ZOLOFT) 100 MG tablet Take 2 tablets (200 mg total) by mouth every morning.   SitaGLIPtin-MetFORMIN HCl 50-1000 MG TB24 Take 1 tablet by mouth 2 (two) times daily.    Suvorexant (BELSOMRA) 20 MG TABS Take 20 mg by mouth at bedtime as needed.   traZODone (DESYREL) 50 MG tablet TAKE ONE TABLET BY MOUTH EVERYDAY AT BEDTIME   Facility-Administered Encounter Medications as of 01/01/2021  Medication   influenza vaccine adjuvanted (FLUAD) injection 0.5 mL   sodium chloride flush (NS) 0.9 % injection 10 mL   BP Readings from Last 3 Encounters:  12/11/20 109/73  11/26/20 (!) 148/70  07/08/20 115/60    Lab Results  Component Value Date   HGBA1C 6.5 (H) 12/11/2020  Recent OV, Consult or Hospital visit:  12/20/20-Telemedicine follow up pneumonia after hospital stay , 11/26/20-Oncology, 12/09/20 thru 12/11/20-[facility]-Patient presented for confusion and a fall.Labs ordered,Chest xray,The patient was given IV cefepime, vancomycin and Flagyl for broad-spectrum coverage initially, 1 L bolus of IV l normal saline followed 150 mill per hour of IV lactated Ringer.  She will be admitted to a medical monitored bed for further evaluation and  management,hold Janumet XR.   Last adherence delivery date:12/16/20      Patient is due for next adherence delivery on: 01/14/21  Spoke with patient on 01/01/21 reviewed medications and coordinated delivery.  This delivery to include: Adherence Packaging  30 Days  Packs: Pregabalin 150mg - 2 daily (2 bedtime) Sertraline 100mg - 2 tablets daily (2-breakfast)  Ropinirole 1mg  -  tablet (breakfast) and 3 tablets nightly (bedtime)  Amitriptyline 50mg - 1 tablet daily (bedtime)  Atorvastatin 10mg - 1 tablet daily (breakfast)  Amlodipine 5mg - 1 tablet daily (breakfast)   Omeprazole 20mg - 1 tablet daily (bedtime)    VIAL medications: None requested    Patient declined the following medications this month: Alprazolam 0.5mg -Take 1 tablet twice daily PRN- has plenty only uses prn Janumet XR 50-1000mg   take 1 tablet 2 times daily-patient states she has supply on hand [employer] states she got some last month and has plenty OTC Alpha lipoic acid 200mg - 1 tablet daily (bedtime) Patient states she has plenty  Trazodone 50mg  take 1 tablet bedtime- patient states she has plenty  Any concerns about your medications? No  How often do you forget or accidentally miss a dose? Rarely Has been in hospital a few days and some medications were not given   Do you use a pillbox? No  Is patient in packaging Yes  What is the date on your next pill pack? 12/20/20  Any concerns or issues with your packaging? She likes the packaging   Refills requested from providers include: Sertraline  Confirmed delivery date of 01/14/21, advised patient that pharmacy will contact them the morning of delivery.  Recent blood pressure readings are as follows: No readings available   Recent blood glucose readings are as follows: Fasting: averages  105, 86,100  Annual wellness visit in last year? No Most Recent BP reading:148/70  71-P  If Diabetic: Most recent A1C reading:6.5  12/11/20 Last  eye exam / retinopathy screening:05/02/20  Last diabetic foot exam:2019  [name], CPP notified  Avel Sensor, [place] Assistant [phone]  I have reviewed the care management and care coordination activities outlined in this encounter and I am certifying that I agree with the content of this note. No further action required.  [name], PharmD Clinical Pharmacist [facility] at [facility] [phone]

## 2021-01-07 ENCOUNTER — Other Ambulatory Visit: Payer: Self-pay | Admitting: Psychiatry

## 2021-01-07 DIAGNOSIS — F411 Generalized anxiety disorder: Secondary | ICD-10-CM

## 2021-01-13 ENCOUNTER — Telehealth: Payer: Self-pay | Admitting: Psychiatry

## 2021-01-13 NOTE — Telephone Encounter (Signed)
Has she tried taking 1.5-2 tabs of Trazodone in place of the Belsomra?

## 2021-01-13 NOTE — Telephone Encounter (Signed)
Pt left a message that she is not able to sleep.at all. She would like something for sleep that is like belsomra but not as expensive. Please give her a call at [place]

## 2021-01-13 NOTE — Telephone Encounter (Signed)
Pt stated she does take trazodone and it does not help either

## 2021-01-13 NOTE — Telephone Encounter (Signed)
Please review

## 2021-01-14 NOTE — Telephone Encounter (Signed)
LVM to rtc 

## 2021-01-15 ENCOUNTER — Telehealth: Payer: Self-pay | Admitting: Psychiatry

## 2021-01-15 NOTE — Telephone Encounter (Signed)
Pt informed

## 2021-01-15 NOTE — Telephone Encounter (Signed)
Pt called back.  It was hard to understand her message, but she did seem to say she was calling back because she hadn't heard back from a previous call.  I see an encounter from 11/28 that looks like it might be what the call was referring to.  Next appt 3/20

## 2021-01-15 NOTE — Telephone Encounter (Signed)
Pt stated no she only takes 1 tab HS

## 2021-01-15 NOTE — Telephone Encounter (Signed)
Attempted to rtc,no answer on either number

## 2021-01-23 DIAGNOSIS — M1712 Unilateral primary osteoarthritis, left knee: Secondary | ICD-10-CM | POA: Diagnosis not present

## 2021-01-23 DIAGNOSIS — M545 Low back pain, unspecified: Secondary | ICD-10-CM | POA: Diagnosis not present

## 2021-01-31 ENCOUNTER — Telehealth: Payer: Self-pay

## 2021-01-31 NOTE — Chronic Care Management (AMB) (Addendum)
Chronic Care Management Pharmacy Assistant   Name: Suzanne Strong  MRN: [id number] DOB: 04/07/1945  Reason for Encounter: Medication Adherence and Delivery Coordination   Recent office visits:  None since last CCM contact  Recent consult visits:  None since last CCM contact  Hospital visits:  None in previous 6 months  Medications: Outpatient Encounter Medications as of 01/31/2021  Medication Sig   acetaminophen (TYLENOL) 325 MG tablet Take 2 tablets (650 mg total) by mouth every 6 (six) hours as needed for mild pain (or Fever >/= 101).   albuterol (VENTOLIN HFA) 108 (90 Base) MCG/ACT inhaler INHALE TWO PUFFS BY MOUTH EVERY SIX HOURS AS NEEDED   ALPRAZolam (XANAX) 0.5 MG tablet Take ONE tablet by MOUTH twice daily as needed FOR anxiety OR SLEEP   amitriptyline (ELAVIL) 50 MG tablet Take 1 tablet (50 mg total) by mouth at bedtime.   amLODipine (NORVASC) 5 MG tablet Take 1 tablet (5 mg total) by mouth daily.   atorvastatin (LIPITOR) 10 MG tablet TAKE 1 TABLET(10 MG) BY MOUTH DAILY   b complex vitamins tablet Take 1 tablet by mouth daily.   busPIRone (BUSPAR) 15 MG tablet Take 1 tablet in the morning and 1/2 tablet in the evening for one week, then increase to 1 tablet twice daily   Cholecalciferol (VITAMIN D-1000 MAX ST) 25 MCG (1000 UT) tablet Take 2,000 Units by mouth daily. Takes three 50 mcg tablets, 3 capsules daily   Emollient (COLLAGEN EX)    ferrous sulfate 325 (65 FE) MG tablet Take 1 tablet (325 mg total) by mouth 2 (two) times daily with a meal.   glipiZIDE (GLUCOTROL) 5 MG tablet Take 5 mg by mouth daily before breakfast.   glucose blood (ONETOUCH ULTRA) test strip USE TO check blood glucose ONCE DAILY AND AS NEEDED FOR DIABETES   lidocaine-prilocaine (EMLA) cream Apply 1 application topically as needed. Apply to port when going through Chemo   MELATONIN PO Take 10 mg by mouth.   Multiple Vitamins-Minerals (WOMENS MULTIVITAMIN PO) Take by mouth.   omeprazole  (PRILOSEC) 20 MG capsule TAKE ONE CAPSULE BY MOUTH EVERYDAY AT BEDTIME   pregabalin (LYRICA) 150 MG capsule Take 300 mg by mouth at bedtime.   rOPINIRole (REQUIP) 1 MG tablet TAKE 1/2 TABLET BY MOUTH EVERY MORNING and TAKE THREE TABLETS BY MOUTH EVERYDAY AT BEDTIME   sertraline (ZOLOFT) 100 MG tablet TAKE TWO TABLETS BY MOUTH EVERY MORNING   SitaGLIPtin-MetFORMIN HCl 50-1000 MG TB24 Take 1 tablet by mouth 2 (two) times daily.    Suvorexant (BELSOMRA) 20 MG TABS Take 20 mg by mouth at bedtime as needed.   traZODone (DESYREL) 50 MG tablet TAKE ONE TABLET BY MOUTH EVERYDAY AT BEDTIME   Facility-Administered Encounter Medications as of 01/31/2021  Medication   influenza vaccine adjuvanted (FLUAD) injection 0.5 mL   sodium chloride flush (NS) 0.9 % injection 10 mL   BP Readings from Last 3 Encounters:  12/11/20 109/73  11/26/20 (!) 148/70  07/08/20 115/60    Lab Results  Component Value Date   HGBA1C 6.5 (H) 12/11/2020      No OVs, Consults, or hospital visits since last care coordination call / Pharmacist visit. No medication changes indicated   Last adherence delivery date:01/14/21      Patient is due for next adherence delivery on: 02/13/21  Spoke with patient on 01/31/21 reviewed medications and coordinated delivery.  This delivery to include: Adherence Packaging  30 Days  Packs: Pregabalin 150mg - 2 daily (  2 bedtime) Sertraline 100mg - 2 tablets daily (2-breakfast)  Ropinirole 1mg  -  tablet (breakfast) and 3 tablets nightly (bedtime)  Amitriptyline 50mg - 1 tablet daily (bedtime)  Atorvastatin 10mg - 1 tablet daily (breakfast)  Amlodipine 5mg - 1 tablet daily (breakfast)   Omeprazole 20mg - 1 tablet daily (bedtime)   VIAL medications: Albuterol HFA 108 inhaler-inhale 2 puffs every 6 hours as needed Alprazolam 0.5mg  take 1 tablet 2 times daily as needed for anxiety   Patient declined the following medications this month: [employer]ouch [employer] Strips-Patient states has  plenty OTC Alpha lipoic acid 200mg - 1 tablet daily (bedtime) Patient states she has plenty  Trazodone 50mg  take 1 tablet bedtime- patient states she has plenty Janumet XR 50-1000mg   take 1 tablet 2 times daily-Patient will wait until next year due to donut hole  Any concerns about your medications? No  The patient reports she takes glipizide and has supply on hand.  How often do you forget or accidentally miss a dose? Rarely  Do you use a pillbox? No  Is patient in packaging Yes  What is the date on your next pill pack?01/20/21  Any concerns or issues with your packaging? The patient is happy with the packaging   Refills requested from providers include: Alprazolam 0.5mg    Confirmed delivery date of 02/13/21, advised patient that pharmacy will contact them the morning of delivery.  Recent blood pressure readings are as follows: no readings available    Recent blood glucose readings are as follows: Fasting:  110,123,170( cortisone injection knee day before) and  134    Annual wellness visit in last year? No Most Recent BP reading:148/70  71-P  11/26/20  If Diabetic: Most recent A1C reading:  6.5  12/11/20 Last eye exam / retinopathy screening: 05/02/20 Last diabetic foot exam: 2019  [name], CPP notified  Avel Sensor, [place] Assistant [phone]  I have reviewed the care management and care coordination activities outlined in this encounter and I am certifying that I agree with the content of this note. No further action required.  [name], PharmD Clinical Pharmacist [facility] at [facility] [phone]

## 2021-02-04 ENCOUNTER — Encounter: Payer: Self-pay | Admitting: Family Medicine

## 2021-02-05 ENCOUNTER — Other Ambulatory Visit: Payer: Self-pay | Admitting: Psychiatry

## 2021-02-05 DIAGNOSIS — F411 Generalized anxiety disorder: Secondary | ICD-10-CM

## 2021-02-12 DIAGNOSIS — E11649 Type 2 diabetes mellitus with hypoglycemia without coma: Secondary | ICD-10-CM | POA: Diagnosis not present

## 2021-02-12 DIAGNOSIS — E119 Type 2 diabetes mellitus without complications: Secondary | ICD-10-CM | POA: Diagnosis not present

## 2021-02-12 NOTE — Progress Notes (Addendum)
Spoke with the patient about pregabalin (pt receiving from [facility], does she want this filled at [facility] instead for med sync). Dr.Solum is prescribing this and they are slowly adjusting the dose. The patient is filling this at [place]. Due to this location is close to the patient if she needs to adjust the prescription while getting on a schedule with this medication. [facility] notified.  [name], CPP notified  Avel Sensor, [place] Assistant [phone]  Total time spent for month CPA: 20 min.

## 2021-02-28 ENCOUNTER — Telehealth: Payer: Self-pay

## 2021-02-28 ENCOUNTER — Other Ambulatory Visit: Payer: Self-pay | Admitting: Psychiatry

## 2021-02-28 DIAGNOSIS — F411 Generalized anxiety disorder: Secondary | ICD-10-CM

## 2021-02-28 NOTE — Progress Notes (Signed)
° ° °  Chronic Care Management Pharmacy Assistant   Name: Suzanne Strong  MRN: [id number] DOB: [birth year]  Reason for Encounter: CCM (Alprazolam Refill)   Patient called in to [facility] asking for Alprazolam to be refilled and delivered. [facility] shows patient got it filled at another pharmacy and was unable to fill it. I returned the patient's call. Patient stated she did in fact get it filled at [facility] last time and they have a refill waiting for her. Patient will go to [facility] today and pick up the medication.   [name], CPP notified  Marijean Niemann, [place] Clinical Pharmacy Assistant [phone]  Time Spent: 10 Minutes

## 2021-03-03 ENCOUNTER — Other Ambulatory Visit: Payer: Self-pay | Admitting: Psychiatry

## 2021-03-03 DIAGNOSIS — F411 Generalized anxiety disorder: Secondary | ICD-10-CM

## 2021-03-04 ENCOUNTER — Telehealth: Payer: Self-pay

## 2021-03-04 NOTE — Chronic Care Management (AMB) (Addendum)
Chronic Care Management Pharmacy Assistant   Name: Suzanne Strong  MRN: [id number] DOB: 1945-05-11  Reason for Encounter: Medication Adherence and Delivery Coordination  Recent office visits:  None since last CCM contact  Recent consult visits:  02/12/21 - Endocrinology, telemedicine - Patient presented for follow up diabetes. New A1c is 6.9%. Stop metformin, start Ozempic 0.25 mg weekly for 4 weeks, then increase to 0.5 mg weekly. Follow up 6 months.  Hospital visits:  None in previous 6 months  Medications: Outpatient Encounter Medications as of 03/04/2021  Medication Sig   acetaminophen (TYLENOL) 325 MG tablet Take 2 tablets (650 mg total) by mouth every 6 (six) hours as needed for mild pain (or Fever >/= 101).   albuterol (VENTOLIN HFA) 108 (90 Base) MCG/ACT inhaler INHALE TWO PUFFS BY MOUTH EVERY SIX HOURS AS NEEDED   ALPRAZolam (XANAX) 0.5 MG tablet TAKE ONE TABLET BY MOUTH twice daily AS NEEDED FOR ANXIETY OR SLEEP   amitriptyline (ELAVIL) 50 MG tablet Take 1 tablet (50 mg total) by mouth at bedtime.   amLODipine (NORVASC) 5 MG tablet Take 1 tablet (5 mg total) by mouth daily.   atorvastatin (LIPITOR) 10 MG tablet TAKE 1 TABLET(10 MG) BY MOUTH DAILY   b complex vitamins tablet Take 1 tablet by mouth daily.   busPIRone (BUSPAR) 15 MG tablet Take 1 tablet in the morning and 1/2 tablet in the evening for one week, then increase to 1 tablet twice daily   Cholecalciferol (VITAMIN D-1000 MAX ST) 25 MCG (1000 UT) tablet Take 2,000 Units by mouth daily. Takes three 50 mcg tablets, 3 capsules daily   Emollient (COLLAGEN EX)    ferrous sulfate 325 (65 FE) MG tablet Take 1 tablet (325 mg total) by mouth 2 (two) times daily with a meal.   glipiZIDE (GLUCOTROL) 5 MG tablet Take 5 mg by mouth daily before breakfast.   glucose blood (ONETOUCH ULTRA) test strip USE TO check blood glucose ONCE DAILY AND AS NEEDED FOR DIABETES   lidocaine-prilocaine (EMLA) cream Apply 1 application  topically as needed. Apply to port when going through Chemo   MELATONIN PO Take 10 mg by mouth.   Multiple Vitamins-Minerals (WOMENS MULTIVITAMIN PO) Take by mouth.   omeprazole (PRILOSEC) 20 MG capsule TAKE ONE CAPSULE BY MOUTH EVERYDAY AT BEDTIME   pregabalin (LYRICA) 150 MG capsule Take 300 mg by mouth at bedtime.   rOPINIRole (REQUIP) 1 MG tablet TAKE 1/2 TABLET BY MOUTH EVERY MORNING and TAKE THREE TABLETS BY MOUTH EVERYDAY AT BEDTIME   sertraline (ZOLOFT) 100 MG tablet TAKE TWO TABLETS BY MOUTH EVERY MORNING   SitaGLIPtin-MetFORMIN HCl 50-1000 MG TB24 Take 1 tablet by mouth 2 (two) times daily.    Suvorexant (BELSOMRA) 20 MG TABS Take 20 mg by mouth at bedtime as needed.   traZODone (DESYREL) 50 MG tablet TAKE ONE TABLET BY MOUTH EVERYDAY AT BEDTIME   Facility-Administered Encounter Medications as of 03/04/2021  Medication   influenza vaccine adjuvanted (FLUAD) injection 0.5 mL   sodium chloride flush (NS) 0.9 % injection 10 mL   BP Readings from Last 3 Encounters:  12/11/20 109/73  11/26/20 (!) 148/70  07/08/20 115/60    Lab Results  Component Value Date   HGBA1C 6.5 (H) 12/11/2020      Recent OV, Consult or Hospital visit:  Recent medication changes indicated: Stop metformin, start Ozempic 0.25 mg weekly x 4 weeks, then increase to 0.5 mg    Last adherence delivery date:02/13/21  Patient is due for next adherence delivery on: 03/14/21  Spoke with patient on 03/04/21 reviewed medications and coordinated delivery.  This delivery to include: Adherence Packaging  30 Days  Packs: Pregabalin 150mg - 2 daily (2 bedtime) Sertraline 100mg - 2 tablets daily (2-breakfast)  Ropinirole 1mg  -  tablet (breakfast) and 3 tablets nightly (bedtime)  Amitriptyline 50mg - 1 tablet daily (bedtime)  Atorvastatin 10mg - 1 tablet daily (breakfast)  Amlodipine 5mg - 1 tablet daily (breakfast)   Omeprazole 20mg - 1 tablet daily (bedtime)   VIAL medications: Alprazolam 0.5mg  take 1 tablet 2  times daily as needed for anxiety    Patient declined the following medications this month: Albuterol HFA 108 inhaler-inhale 2 puffs every 6 hours as needed [employer] states has plenty  Any concerns about your medications? Yes  Patient requested refill on alprazolam 0.5mg  take twice daily as needed. Dr.Solum changed the patient from [place] to [place]. Patient currently on 0.25 ozempic once a week, happy with results, will finish 0.25mg  and then increase to 0.50 mg weekly per Dr.Solum. [facility] (Ozempic 0.5 mg once weekly) [facility] has it for 24.99, 1 month supply.  How often do you forget or accidentally miss a dose? Rarely  Is patient in packaging Yes  What is the date on your next pill pack?02/20/21  Any concerns or issues with your packaging?  No concerns about packaging  Refills requested from providers include: pregabalin, sertraline   Confirmed delivery date of 03/14/21, advised patient that pharmacy will contact them the morning of delivery.  Recent blood pressure readings are as follows: none available  Recent blood glucose readings are as follows: Fasting:  03/04/21  108 After Meals:  highest reading -140  Annual wellness visit in last year? No Most Recent BP reading:148/70  71-P  11/26/20  If Diabetic: Most recent A1C reading: 6.9% 02/12/21 Last eye exam / retinopathy screening: UTD Last diabetic foot exam: UTD  [name], CPP notified  Avel Sensor, [place] Assistant [phone]  I have reviewed the care management and care coordination activities outlined in this encounter and I am certifying that I agree with the content of this note. No further action required.  [name], PharmD Clinical Pharmacist [facility] at [facility] [phone]

## 2021-03-11 NOTE — Telephone Encounter (Addendum)
Ozempic 0.5mg  is on back order at [facility] and [facility]. Rx has been transferred and on hold at [facility]. We will continue to search for supply and stay in touch with the patient.  [name], CPP notified  Avel Sensor, [place] Assistant [phone]  Total time spent for month CPA: 20 min.

## 2021-03-14 ENCOUNTER — Other Ambulatory Visit: Payer: Self-pay | Admitting: Psychiatry

## 2021-03-14 DIAGNOSIS — F5101 Primary insomnia: Secondary | ICD-10-CM

## 2021-03-17 ENCOUNTER — Telehealth: Payer: Self-pay | Admitting: Family Medicine

## 2021-03-17 ENCOUNTER — Telehealth: Payer: Self-pay | Admitting: Psychiatry

## 2021-03-17 ENCOUNTER — Other Ambulatory Visit: Payer: Self-pay

## 2021-03-17 DIAGNOSIS — F5101 Primary insomnia: Secondary | ICD-10-CM

## 2021-03-17 MED ORDER — TRAZODONE HCL 50 MG PO TABS: 100.0000 mg | ORAL_TABLET | Freq: Every day | ORAL | 0 refills | Status: DC

## 2021-03-17 NOTE — Telephone Encounter (Signed)
Pt called wanting to let Dr Glori Bickers know that she tested positive for covid. Please advise

## 2021-03-17 NOTE — Telephone Encounter (Signed)
Rx sent 

## 2021-03-17 NOTE — Telephone Encounter (Signed)
Please schedule a virtual visit with provider to discuss sxs

## 2021-03-17 NOTE — Telephone Encounter (Addendum)
Pt called reporting Trazodone dose is now 2 at bedtime 50 mg. Increased due to not taking Belsoma. Rx sent to [facility] was 1 @ bedtime 50 mg. Pt out. Pharmacy is asking for corrected dose Rx, before filling. Contact # pt [phone]

## 2021-03-17 NOTE — Telephone Encounter (Signed)
Called Suzanne Strong and got her scheduled for 1/31 @330 

## 2021-03-18 ENCOUNTER — Encounter: Payer: Self-pay | Admitting: Family Medicine

## 2021-03-18 ENCOUNTER — Other Ambulatory Visit: Payer: Self-pay

## 2021-03-18 ENCOUNTER — Telehealth: Payer: Self-pay | Admitting: Psychiatry

## 2021-03-18 ENCOUNTER — Other Ambulatory Visit: Payer: Self-pay | Admitting: Psychiatry

## 2021-03-18 ENCOUNTER — Telehealth (INDEPENDENT_AMBULATORY_CARE_PROVIDER_SITE_OTHER): Payer: PPO | Admitting: Family Medicine

## 2021-03-18 DIAGNOSIS — U071 COVID-19: Secondary | ICD-10-CM | POA: Insufficient documentation

## 2021-03-18 DIAGNOSIS — F5101 Primary insomnia: Secondary | ICD-10-CM

## 2021-03-18 MED ORDER — TRAZODONE HCL 50 MG PO TABS: 100.0000 mg | ORAL_TABLET | Freq: Every day | ORAL | 0 refills | Status: DC

## 2021-03-18 MED ORDER — NIRMATRELVIR/RITONAVIR (PAXLOVID)TABLET: 3.0000 | ORAL_TABLET | Freq: Two times a day (BID) | ORAL | 0 refills | Status: AC

## 2021-03-18 NOTE — Telephone Encounter (Signed)
Called patient. Dose has been increased from 50 mg to 100 mg. New Rx had been sent today for 100 mg.

## 2021-03-18 NOTE — Telephone Encounter (Signed)
Chart reviewed and Ozempic filled 03/14/21 28 day supply at [facility].

## 2021-03-18 NOTE — Telephone Encounter (Signed)
Suzanne Strong called and said that [facility] is telling her that it is too early for her to fill the Trazodone and she needs to wait until 04/07/21 to fill it. She is totally out now since last night. Could someone please call her at [phone].

## 2021-03-18 NOTE — Assessment & Plan Note (Addendum)
Mild to moderate symptoms in 76 yo pt with risk factors and h/o pneumonia  Will need to monitor closely  Disc antiviral option-plan to start paxlovid  Disc poss side eff and will update  Rev symptom care Reviewed ER precautions  Reviewed isolation protocols and pt voiced  understanding Handouts given Will continue albuterol mdi prn inst to call if she starts wheezing (to consider inhaler)  Update if not starting to improve in a week or if worsening

## 2021-03-18 NOTE — Patient Instructions (Addendum)
Take the paxlovid as directed  Drink fluids and rest   Drink fluids and rest  mucinex DM is good for cough and congestion  Nasal saline for congestion as needed  Tylenol for fever or pain or headache  Please alert [place] if symptoms worsen (if severe or short of breath please go to the ER)   If you start to wheeze please call and let me know, we may need to consider prednisone   Isolate for at least 5 days (longer if symptoms persist)  After that mask for 10 days

## 2021-03-18 NOTE — Progress Notes (Signed)
Virtual Visit via Video Note  I connected with Suzanne Strong on 03/18/21 at  3:30 PM EST by a video enabled telemedicine application and verified that I am speaking with the correct person using two identifiers.  Location: Patient: home Provider: office    I discussed the limitations of evaluation and management by telemedicine and the availability of in person appointments. The patient expressed understanding and agreed to proceed.  This visit occurred during the SARS-CoV-2 public health emergency.  Safety protocols were in place, including screening questions prior to the visit, additional usage of staff PPE, and extensive cleaning of exam room while observing appropriate contact time as indicated for disinfecting solutions.   Video failed today  Visit was done by phone   History   pt presents with c/o symptoms of covid 19  Last week she felt tired and ran a little temp  Took 3 covid tests -all negative   Fever-Sunday night /aches  Tired and weak all over  A little swimmy headed  Covid test yesterday was positive   Little cough  Mild if any sob with exertion (takes it all out of her) Using her inhaler and it helps  Does not hear any wheezing but noted rattle  Non prod cough   A little runny nose Not bad today  Some headache on sat/sun and Monday   -that is better now and was not severe  ST yesterday am- improved now  Ears are fine   Nausea and diarrhea last week / ? If related or not    She is covid immunized   Lab Results  Component Value Date   CREATININE 0.92 12/27/2020   BUN 13 12/27/2020   NA 140 12/27/2020   K 4.4 12/27/2020   CL 102 12/27/2020   CO2 30 12/27/2020   Taking tylenol  Drinking fluids   Patient Active Problem List   Diagnosis Date Noted   COVID-19 03/18/2021   Rhabdomyolysis 12/20/2020   Aspiration pneumonia ([place]) 12/09/2020   Dysuria 07/17/2020   Dysphagia    Gastric erythema    Gastric polyp    Encounter for screening  colonoscopy    Polyp of colon    History of aspiration pneumonia 02/15/2020   Diabetes mellitus without complication ([place])    GERD (gastroesophageal reflux disease)    Hypertension    Urinary urgency 01/05/2020   Grief reaction 09/18/2019   Swallowing dysfunction 07/18/2019   Cough 02/21/2019   Fever, intermittent 09/01/2018   Pre-syncope 02/07/2018   Orthostatic hypotension 01/26/2018   Episodic weakness 01/26/2018   Medicare annual wellness visit, subsequent 06/29/2017   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   Preoperative examination 04/27/2017   Carpal tunnel syndrome of right wrist 11/13/2016   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/2016   Obstructive sleep apnea 01/01/2016   Carcinoma of upper-outer quadrant of left breast in female, estrogen receptor negative ([place]) 11/21/2015   Hypomagnesemia 08/28/2015   Fever 04/11/2015   Initial Medicare annual wellness visit 10/16/2014   Estrogen deficiency 10/16/2014   Colon cancer screening 10/16/2014   Controlled type 2 diabetes mellitus without complication ([place]) 34/74/2595   RLS (restless legs syndrome) 02/21/2014   Chronic neck pain 07/25/2010   Depression with anxiety 07/07/2010   Routine general medical examination at a health care facility 06/26/2010   B12 deficiency 09/04/2009   Anemia, iron deficiency 08/29/2009   Anxiety state 08/26/2009   GASTROPARESIS 08/26/2009   Vitamin D deficiency 07/19/2008   Disorder of bone and  cartilage 07/19/2007   Hyperlipidemia 07/18/2007   EDEMA 07/18/2007   Skin lesion, superficial 06/02/2007   Past Medical History:  Diagnosis Date   Anemia    iron deficiency and B12 deficiency   Anxiety    Breast cancer ([place]) 02/25/2015   upper-outer quadrant of left female breast, Triple Negative. Neo-adjuvant chemo with complete pathologic response.    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   BREAST CYST ASPIRATION     BREAST EXCISIONAL BIOPSY Left 12/2015   surgery   BREAST LUMPECTOMY WITH NEEDLE LOCALIZATION Left 10/02/2015   Procedure: BREAST LUMPECTOMY WITH NEEDLE LOCALIZATION;  Surgeon: [name], MD;  Location: [facility];  Service: General;  Laterality: Left;   BREAST LUMPECTOMY WITH SENTINEL LYMPH NODE BIOPSY Left 10/02/2015   Procedure: LEFT BREAST WIDE EXCISION WITH SENTINEL LYMPH NODE BX;  Surgeon: [name], MD;  Location: [facility];  Service: General;  Laterality: Left;   BREAST SURGERY     breast biopsy benign   CARPAL TUNNEL RELEASE Right 05/19/2017   Procedure: CARPAL TUNNEL RELEASE;  Surgeon: Earnestine Leys, MD;  Location: [facility];  Service: Orthopedics;  Laterality: Right;   CATARACT EXTRACTION W/PHACO Right 10/26/2018   Procedure: CATARACT EXTRACTION PHACO AND INTRAOCULAR LENS PLACEMENT ([place]) right diabetic;  Surgeon: Leandrew Koyanagi, MD;  Location: [facility];  Service: Ophthalmology;  Laterality: Right;  Diabetic - oral meds cancer center been notified and will come   CATARACT EXTRACTION W/PHACO Left 11/16/2018   Procedure: CATARACT EXTRACTION PHACO AND INTRAOCULAR LENS PLACEMENT ([facility]) LEFT DIABETIC  01:01.0  17.0%  10.37;  Surgeon: Leandrew Koyanagi, MD;  Location: [facility];  Service: Ophthalmology;  Laterality: Left;  Diabetic - oral meds Port-a-cath   CHOLECYSTECTOMY      Cologuard  07/22/2016   Negative   COLONOSCOPY WITH PROPOFOL N/A 05/24/2020   Procedure: COLONOSCOPY WITH PROPOFOL;  Surgeon: Virgel Manifold, MD;  Location: [facility];  Service: Endoscopy;  Laterality: N/A;   ESOPHAGOGASTRODUODENOSCOPY  07/2009   normal with few gastric polyps   ESOPHAGOGASTRODUODENOSCOPY (EGD) WITH PROPOFOL N/A 05/24/2020   Procedure: ESOPHAGOGASTRODUODENOSCOPY (EGD) WITH PROPOFOL;  Surgeon: Virgel Manifold, MD;  Location: [facility];  Service: Endoscopy;  Laterality: N/A;   MOLE REMOVAL     PORT-A-CATH REMOVAL     PORTACATH PLACEMENT     PORTACATH PLACEMENT Right 03/12/2015   Procedure: INSERTION PORT-A-CATH;  Surgeon: [name], MD;  Location: [facility];  Service: General;  Laterality: Right;   RADICAL NECK DISSECTION     TONSILLECTOMY     cancer treated with chemo and radiation   TRIGGER FINGER RELEASE Right 05/19/2017   Procedure: RELEASE TRIGGER FINGER/A-1 PULLEY ring finger;  Surgeon: Earnestine Leys, MD;  Location: [facility];  Service: Orthopedics;  Laterality: Right;   Social History   Tobacco Use   Smoking status: Former    Packs/day: 0.50    Years: 6.00    Pack years: 3.00    Types: Cigarettes    Quit date: 03/10/1984    Years since quitting: 37.0   Smokeless tobacco: Never  Vaping Use   Vaping Use: Never used  Substance Use Topics   Alcohol use: No    Alcohol/week: 0.0 standard drinks   Drug use: No   Family History  Problem Relation Age of Onset   Osteoporosis Mother    Hyperlipidemia Mother    Depression Mother    Cancer Mother        skin cancer ? basal cell and lung ca heavy smoker   Alcohol abuse Father    Cancer Father        skin CA ? basal cell   Heart disease Father        CHF   Hyperlipidemia Brother    Hypertension Brother    Breast cancer Neg Hx    Allergies  Allergen Reactions   Amoxicillin Swelling    REACTION: rash, swelling Has patient had a PCN reaction causing immediate rash, facial/tongue/throat  swelling, SOB or lightheadedness with hypotension: yes Has patient had a PCN reaction causing severe rash involving mucus membranes or skin necrosis: no Has patient had a PCN reaction that required hospitalization: no Has patient had a PCN reaction occurring within the last 10 years: yes If all of the above answers are "NO", then may proceed with Cephalosporin use.  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     albuterol (VENTOLIN HFA) 108 (90 Base) MCG/ACT inhaler INHALE TWO PUFFS BY MOUTH EVERY SIX HOURS AS NEEDED 8.5 g 3   ALPRAZolam (XANAX) 0.5 MG tablet TAKE ONE TABLET BY MOUTH twice daily AS NEEDED FOR ANXIETY OR SLEEP 60 tablet 2   amitriptyline (ELAVIL) 50 MG tablet Take 1 tablet (50 mg total) by mouth at bedtime. 90 tablet 3   amLODipine (NORVASC) 5 MG tablet Take 1 tablet (5 mg total) by mouth daily. 90 tablet 3   atorvastatin (LIPITOR) 10 MG tablet TAKE 1 TABLET(10 MG) BY MOUTH DAILY 90 tablet 3   b complex vitamins tablet Take 1 tablet by mouth daily.     busPIRone (BUSPAR) 15 MG tablet Take 1 tablet in the morning and 1/2 tablet in the evening for one week, then increase to 1 tablet twice daily 180 tablet 1   Cholecalciferol (VITAMIN D-1000 MAX ST) 25 MCG (1000 UT) tablet Take 2,000 Units by mouth daily. Takes three 50 mcg tablets, 3 capsules daily     Emollient (COLLAGEN EX)      ferrous sulfate 325 (65 FE) MG tablet Take 1 tablet (325 mg total) by mouth 2 (two) times daily with a meal.     glipiZIDE  (GLUCOTROL) 5 MG tablet Take 5 mg by mouth daily before breakfast.  glucose blood (ONETOUCH ULTRA) test strip USE TO check blood glucose ONCE DAILY AND AS NEEDED FOR DIABETES 100 strip 1   MELATONIN PO Take 10 mg by mouth.     Multiple Vitamins-Minerals (WOMENS MULTIVITAMIN PO) Take by mouth.     omeprazole (PRILOSEC) 20 MG capsule TAKE ONE CAPSULE BY MOUTH EVERYDAY AT BEDTIME 90 capsule 3   pregabalin (LYRICA) 150 MG capsule Take 300 mg by mouth at bedtime.     rOPINIRole (REQUIP) 1 MG tablet TAKE 1/2 TABLET BY MOUTH EVERY MORNING and TAKE THREE TABLETS BY MOUTH EVERYDAY AT BEDTIME 315 tablet 5   sertraline (ZOLOFT) 100 MG tablet TAKE TWO TABLETS BY MOUTH EVERY MORNING 60 tablet 1   SitaGLIPtin-MetFORMIN HCl 50-1000 MG TB24 Take 1 tablet by mouth 2 (two) times daily.  (Patient not taking: Reported on 03/18/2021)  0   Current Facility-Administered Medications on File Prior to Visit  Medication Dose Route Frequency Provider Last Rate Last Admin   influenza vaccine adjuvanted (FLUAD) injection 0.5 mL  0.5 mL Intramuscular Once Charlaine Dalton R, MD       sodium chloride flush (NS) 0.9 % injection 10 mL  10 mL Intravenous PRN Cammie Sickle, MD   10 mL at 09/11/15 0845   Review of Systems  Constitutional:  Positive for malaise/fatigue. Negative for chills and fever.  HENT:  Positive for congestion and sore throat. Negative for ear pain and sinus pain.   Eyes:  Negative for blurred vision, discharge and redness.  Respiratory:  Positive for cough. Negative for sputum production, shortness of breath, wheezing and stridor.   Cardiovascular:  Negative for chest pain, palpitations and leg swelling.  Gastrointestinal:  Negative for abdominal pain, diarrhea, nausea and vomiting.  Musculoskeletal:  Negative for myalgias.  Skin:  Negative for rash.  Neurological:  Negative for dizziness and headaches.   Observations/Objective:  Pt sounds like her usual self  Not distressed  Voice is  hoarse (baseline)  Occ clears throat  No cough during interview  Not sob or wheezing with conversation  Nl cognition/good historian  Nl mood    Assessment and Plan: Problem List Items Addressed This Visit       Other   COVID-19    Mild to moderate symptoms in 76 yo pt with risk factors and h/o pneumonia  Will need to monitor closely  Disc antiviral option-plan to start paxlovid  Disc poss side eff and will update  Rev symptom care Reviewed ER precautions  Reviewed isolation protocols and pt voiced  understanding Handouts given Will continue albuterol mdi prn inst to call if she starts wheezing (to consider inhaler)  Update if not starting to improve in a week or if worsening        Relevant Medications   nirmatrelvir/ritonavir EUA (PAXLOVID) 20 x 150 MG & 10 x 100MG  TABS      Follow Up Instructions: Take the paxlovid as directed  Drink fluids and rest   Drink fluids and rest  mucinex DM is good for cough and congestion  Nasal saline for congestion as needed  Tylenol for fever or pain or headache  Please alert [place] if symptoms worsen (if severe or short of breath please go to the ER)   If you start to wheeze please call and let me know, we may need to consider prednisone   Isolate for at least 5 days (longer if symptoms persist)  After that mask for 10 days    I discussed the assessment and  treatment plan with the patient. The patient was provided an opportunity to ask questions and all were answered. The patient agreed with the plan and demonstrated an understanding of the instructions.   The patient was advised to call back or seek an in-person evaluation if the symptoms worsen or if the condition fails to improve as anticipated.  I provided 17 minutes of non-face-to-face time during this encounter.   [name], MD

## 2021-03-24 ENCOUNTER — Encounter: Payer: Self-pay | Admitting: Family Medicine

## 2021-03-24 NOTE — Telephone Encounter (Signed)
Appt scheduled for tomorrow in office with PCP

## 2021-03-25 ENCOUNTER — Ambulatory Visit (INDEPENDENT_AMBULATORY_CARE_PROVIDER_SITE_OTHER)
Admission: RE | Admit: 2021-03-25 | Discharge: 2021-03-25 | Disposition: A | Discharge status: Home / Self Care | Payer: PPO | Source: Ambulatory Visit | Attending: Family Medicine | Admitting: Family Medicine

## 2021-03-25 ENCOUNTER — Encounter: Payer: Self-pay | Admitting: Family Medicine

## 2021-03-25 ENCOUNTER — Ambulatory Visit (INDEPENDENT_AMBULATORY_CARE_PROVIDER_SITE_OTHER): Payer: PPO | Admitting: Family Medicine

## 2021-03-25 ENCOUNTER — Other Ambulatory Visit: Payer: Self-pay

## 2021-03-25 VITALS — BP 156/82 | HR 94 | Temp 97.3°F | Ht 66.0 in | Wt 208.4 lb

## 2021-03-25 DIAGNOSIS — U071 COVID-19: Secondary | ICD-10-CM

## 2021-03-25 DIAGNOSIS — R059 Cough, unspecified: Secondary | ICD-10-CM | POA: Diagnosis not present

## 2021-03-25 DIAGNOSIS — J9811 Atelectasis: Secondary | ICD-10-CM | POA: Diagnosis not present

## 2021-03-25 MED ORDER — PREDNISONE 10 MG PO TABS: ORAL_TABLET | ORAL | 0 refills | Status: DC

## 2021-03-25 MED ORDER — CEFDINIR 300 MG PO CAPS: 300.0000 mg | ORAL_CAPSULE | Freq: Two times a day (BID) | ORAL | 0 refills | Status: DC

## 2021-03-25 MED ORDER — AZITHROMYCIN 250 MG PO TABS: ORAL_TABLET | ORAL | 0 refills | Status: DC

## 2021-03-25 NOTE — Progress Notes (Signed)
Subjective:    Patient ID: Suzanne Strong, female    DOB: [birth year], 76 y.o.   MRN: [id number]  This visit occurred during the SARS-CoV-2 public health emergency.  Safety protocols were in place, including screening questions prior to the visit, additional usage of staff PPE, and extensive cleaning of exam room while observing appropriate contact time as indicated for disinfecting solutions.   HPI Pt presents for f/u of covid 19 with cough   Wt Readings from Last 3 Encounters:  03/25/21 208 lb 6 oz (94.5 kg)  12/09/20 206 lb (93.4 kg)  11/26/20 216 lb 6.4 oz (98.2 kg)   33.63 kg/m  Feeling very tired/wearing out  Wants to sleep a lot   Cough persists  No phlegm at all  Does not think she is wheezing  A little more sob on exertion /mild if any  Her inhaler helps with that  Some pain in R side of back when she coughs (cough is hard)   Does not feel as bad as she does with pneumonia   Had a temp 101 - then it went down and never came back   No ST Not a lot of nasal congestion  (it is worse at night)  Better this am   One headache with covid-now gone    Hoarse voice - a little improved now    She has a h/o pna (aspiration) recurrent in the past  This usually follows a choking event (she has had several in past few days)  It presents with fatigue and hypoxia Pulse ox here was 89% after ambulation then up to 94% with sitting  She was seen virtually for covid 19 on 03/18/21 and tx with paxlovid   Has albuterol mdi  Does not feel like she needs cough medicines  Cxr: DG Chest 2 View  Result Date: 03/25/2021 CLINICAL DATA:  Cough. EXAM: CHEST - 2 VIEW COMPARISON:  December 09, 2020. FINDINGS: The heart size and mediastinal contours are within normal limits. Mild bibasilar subsegmental atelectasis is noted. The visualized skeletal structures are unremarkable. IMPRESSION: Mild bibasilar subsegmental atelectasis. Electronically Signed   By: Marijo Conception M.D.   On:  03/25/2021 09:34    Patient Active Problem List   Diagnosis Date Noted   COVID-19 03/18/2021   Rhabdomyolysis 12/20/2020   Aspiration pneumonia ([place]) 12/09/2020   Dysuria 07/17/2020   Dysphagia    Gastric erythema    Gastric polyp    Encounter for screening colonoscopy    Polyp of colon    History of aspiration pneumonia 02/15/2020   Diabetes mellitus without complication ([place])    GERD (gastroesophageal reflux disease)    Hypertension    Urinary urgency 01/05/2020   Grief reaction 09/18/2019   Swallowing dysfunction 07/18/2019   Cough 02/21/2019   Fever, intermittent 09/01/2018   Pre-syncope 02/07/2018   Orthostatic hypotension 01/26/2018   Episodic weakness 01/26/2018   Medicare annual wellness visit, subsequent 06/29/2017   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   Preoperative examination 04/27/2017   Carpal tunnel syndrome of right wrist 11/13/2016   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  02/17/2016   Obstructive sleep apnea 01/01/2016   Carcinoma of upper-outer quadrant of left breast in female, estrogen receptor negative ([place]) 11/21/2015   Hypomagnesemia 08/28/2015   Fever 04/11/2015   Initial Medicare annual wellness visit 10/16/2014   Estrogen deficiency 10/16/2014   Colon cancer screening 10/16/2014   Controlled type 2 diabetes mellitus without complication ([place]) 30/16/0109  RLS (restless legs syndrome) 02/21/2014   Chronic neck pain 07/25/2010   Depression with anxiety 07/07/2010   Routine general medical examination at a health care facility 06/26/2010   B12 deficiency 09/04/2009   Anemia, iron deficiency 08/29/2009   Anxiety state 08/26/2009   GASTROPARESIS 08/26/2009   Vitamin D deficiency 07/19/2008   Disorder of bone and cartilage 07/19/2007   Hyperlipidemia 07/18/2007   EDEMA 07/18/2007   Skin lesion, superficial 06/02/2007   Past Medical History:  Diagnosis Date   Anemia    iron deficiency and B12 deficiency   Anxiety    Breast cancer  ([place]) 02/25/2015   upper-outer quadrant of left female breast, Triple Negative. Neo-adjuvant chemo with complete pathologic response.    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   BREAST CYST ASPIRATION     BREAST EXCISIONAL BIOPSY Left 12/2015   surgery   BREAST LUMPECTOMY WITH NEEDLE LOCALIZATION Left 10/02/2015   Procedure: BREAST LUMPECTOMY WITH NEEDLE LOCALIZATION;  Surgeon: [name], MD;  Location: [facility];  Service: General;  Laterality: Left;   BREAST LUMPECTOMY WITH SENTINEL LYMPH NODE BIOPSY Left 10/02/2015   Procedure: LEFT BREAST WIDE EXCISION WITH SENTINEL LYMPH NODE BX;  Surgeon: [name], MD;  Location: [facility];  Service: General;  Laterality: Left;   BREAST SURGERY     breast biopsy benign   CARPAL TUNNEL RELEASE Right 05/19/2017   Procedure: CARPAL TUNNEL RELEASE;  Surgeon: Earnestine Leys, MD;  Location: [facility];  Service: Orthopedics;  Laterality: Right;   CATARACT EXTRACTION W/PHACO Right 10/26/2018   Procedure: CATARACT EXTRACTION PHACO AND INTRAOCULAR LENS PLACEMENT ([place]) right diabetic;  Surgeon: Leandrew Koyanagi, MD;  Location: [place];  Service: Ophthalmology;  Laterality: Right;  Diabetic - oral meds cancer center been notified and will come   CATARACT EXTRACTION W/PHACO Left 11/16/2018   Procedure: CATARACT  EXTRACTION PHACO AND INTRAOCULAR LENS PLACEMENT ([facility]) LEFT DIABETIC  01:01.0  17.0%  10.37;  Surgeon: Leandrew Koyanagi, MD;  Location: [place];  Service: Ophthalmology;  Laterality: Left;  Diabetic - oral meds Port-a-cath   CHOLECYSTECTOMY     Cologuard  07/22/2016   Negative   COLONOSCOPY WITH PROPOFOL N/A 05/24/2020   Procedure: COLONOSCOPY WITH PROPOFOL;  Surgeon: Virgel Manifold, MD;  Location: [facility];  Service: Endoscopy;  Laterality: N/A;   ESOPHAGOGASTRODUODENOSCOPY  07/2009   normal with few gastric polyps   ESOPHAGOGASTRODUODENOSCOPY (EGD) WITH PROPOFOL N/A 05/24/2020   Procedure: ESOPHAGOGASTRODUODENOSCOPY (EGD) WITH PROPOFOL;  Surgeon: Virgel Manifold, MD;  Location: [facility];  Service: Endoscopy;  Laterality: N/A;   MOLE REMOVAL     PORT-A-CATH REMOVAL     PORTACATH PLACEMENT     PORTACATH PLACEMENT Right 03/12/2015   Procedure: INSERTION PORT-A-CATH;  Surgeon: [name], MD;  Location: [facility];  Service: General;  Laterality: Right;   RADICAL NECK DISSECTION     TONSILLECTOMY     cancer treated with chemo and radiation   TRIGGER FINGER RELEASE Right 05/19/2017   Procedure: RELEASE TRIGGER FINGER/A-1 PULLEY ring finger;  Surgeon: Earnestine Leys, MD;  Location: [facility];  Service: Orthopedics;  Laterality: Right;   Social History   Tobacco Use   Smoking status: Former    Packs/day: 0.50    Years: 6.00    Pack years: 3.00    Types: Cigarettes    Quit date: 03/10/1984    Years since quitting: 37.0   Smokeless tobacco: Never  Vaping Use   Vaping Use: Never used  Substance Use Topics   Alcohol use: No    Alcohol/week: 0.0 standard drinks   Drug use: No   Family History  Problem Relation Age of Onset   Osteoporosis Mother    Hyperlipidemia Mother    Depression Mother    Cancer Mother        skin cancer ? basal cell and lung ca heavy smoker   Alcohol abuse Father    Cancer Father        skin CA ? basal cell   Heart  disease Father        CHF   Hyperlipidemia Brother    Hypertension Brother    Breast cancer Neg Hx    Allergies  Allergen Reactions   Amoxicillin Swelling    REACTION: rash, swelling Has patient had a PCN reaction causing immediate rash, facial/tongue/throat swelling, SOB or lightheadedness with hypotension: yes Has patient had a PCN reaction causing severe rash involving mucus membranes or skin necrosis: no Has patient had a PCN reaction that required hospitalization: no Has patient had a PCN reaction occurring within the last 10 years: yes If all of the above answers are "NO", then may proceed with Cephalosporin use.  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     albuterol (VENTOLIN HFA) 108 (90 Base) MCG/ACT inhaler INHALE TWO PUFFS BY MOUTH EVERY SIX HOURS AS NEEDED 8.5 g 3   ALPRAZolam (XANAX) 0.5 MG tablet TAKE ONE TABLET BY MOUTH twice daily AS NEEDED FOR ANXIETY OR SLEEP 60 tablet 2   amitriptyline (ELAVIL) 50 MG tablet Take 1 tablet (50 mg total) by mouth at bedtime. 90 tablet 3   amLODipine (NORVASC) 5 MG tablet Take 1 tablet (5 mg total) by mouth daily. 90 tablet 3   atorvastatin (LIPITOR) 10 MG tablet TAKE 1 TABLET(10 MG) BY MOUTH DAILY 90 tablet 3   b complex vitamins tablet Take 1 tablet by mouth daily.     busPIRone (BUSPAR) 15 MG tablet Take 1 tablet in the morning and 1/2 tablet in the evening for one week, then increase to 1 tablet twice daily 180 tablet 1   Cholecalciferol (VITAMIN D-1000 MAX ST)  25 MCG (1000 UT) tablet Take 2,000 Units by  mouth daily. Takes three 50 mcg tablets, 3 capsules daily     Emollient (COLLAGEN EX)      ferrous sulfate 325 (65 FE) MG tablet Take 1 tablet (325 mg total) by mouth 2 (two) times daily with a meal.     glipiZIDE (GLUCOTROL) 5 MG tablet Take 5 mg by mouth daily before breakfast.     glucose blood (ONETOUCH ULTRA) test strip USE TO check blood glucose ONCE DAILY AND AS NEEDED FOR DIABETES 100 strip 1   MELATONIN PO Take 10 mg by mouth.     Multiple Vitamins-Minerals (WOMENS MULTIVITAMIN PO) Take by mouth.     omeprazole (PRILOSEC) 20 MG capsule TAKE ONE CAPSULE BY MOUTH EVERYDAY AT BEDTIME 90 capsule 3   pregabalin (LYRICA) 150 MG capsule Take 300 mg by mouth at bedtime.     rOPINIRole (REQUIP) 1 MG tablet TAKE 1/2 TABLET BY MOUTH EVERY MORNING and TAKE THREE TABLETS BY MOUTH EVERYDAY AT BEDTIME 315 tablet 5   sertraline (ZOLOFT) 100 MG tablet TAKE TWO TABLETS BY MOUTH EVERY MORNING 60 tablet 1   SitaGLIPtin-MetFORMIN HCl 50-1000 MG TB24 Take 1 tablet by mouth 2 (two) times daily.  0   traZODone (DESYREL) 50 MG tablet Take 2 tablets (100 mg total) by mouth at bedtime. 180 tablet 0   Current Facility-Administered Medications on File Prior to Visit  Medication Dose Route Frequency Provider Last Rate Last Admin   influenza vaccine adjuvanted (FLUAD) injection 0.5 mL  0.5 mL Intramuscular Once Charlaine Dalton R, MD       sodium chloride flush (NS) 0.9 % injection 10 mL  10 mL Intravenous PRN Cammie Sickle, MD   10 mL at 09/11/15 0845    Review of Systems  Constitutional:  Positive for fatigue. Negative for activity change, appetite change, fever and unexpected weight change.  HENT:  Positive for congestion. Negative for ear pain, rhinorrhea, sinus pressure and sore throat.   Eyes:  Negative for pain, redness and visual disturbance.  Respiratory:  Positive for cough. Negative for shortness of breath, wheezing and stridor.        Chest  tightness  Cardiovascular:  Negative for chest pain and palpitations.  Gastrointestinal:  Negative for abdominal pain, blood in stool, constipation and diarrhea.  Endocrine: Negative for polydipsia and polyuria.  Genitourinary:  Negative for dysuria, frequency and urgency.  Musculoskeletal:  Negative for arthralgias, back pain and myalgias.  Skin:  Negative for pallor and rash.  Allergic/Immunologic: Negative for environmental allergies.  Neurological:  Negative for dizziness, syncope and headaches.  Hematological:  Negative for adenopathy. Does not bruise/bleed easily.  Psychiatric/Behavioral:  Negative for decreased concentration and dysphoric mood. The patient is not nervous/anxious.       Objective:   Physical Exam Constitutional:      General: She is not in acute distress.    Appearance: Normal appearance. She is well-developed. She is obese. She is not ill-appearing, toxic-appearing or diaphoretic.  HENT:     Head: Normocephalic and atraumatic.     Comments: Nares are injected and congested      Right Ear: Tympanic membrane, ear canal and external ear normal.     Left Ear: Tympanic membrane, ear canal and external ear normal.     Nose: Congestion and rhinorrhea present.     Mouth/Throat:     Mouth: Mucous membranes are dry.     Pharynx: Oropharynx is clear. No oropharyngeal exudate or posterior oropharyngeal erythema.     Comments: Clear pnd  Voice is more hoarse than baseline  Dry MM (baseline for her)  Eyes:     General:        Right eye: No discharge.        Left eye: No discharge.     Conjunctiva/sclera: Conjunctivae normal.     Pupils: Pupils are equal, round, and reactive to light.  Cardiovascular:     Rate and Rhythm: Tachycardia present.     Heart sounds: Normal heart sounds.  Pulmonary:     Effort: Pulmonary effort is normal. No respiratory distress.     Breath sounds: Normal breath sounds. No stridor. No wheezing, rhonchi or rales.     Comments: Crackles  /rales heard at both bases louder and higher up on L side  Wheeze on forced exp only  Scattered rhonchi  Good effort   Chest:     Chest wall: No tenderness.  Musculoskeletal:     Cervical back: Normal range of motion and neck supple.     Right lower leg: No edema.     Left lower leg: No edema.  Lymphadenopathy:     Cervical: No cervical adenopathy.  Skin:    General: Skin is warm and dry.     Capillary Refill: Capillary refill takes less than 2 seconds.     Coloration: Skin is not jaundiced or pale.     Findings: No bruising, erythema or rash.  Neurological:     Mental Status: She is alert.     Cranial Nerves: No cranial nerve deficit.  Psychiatric:        Mood and Affect: Mood normal.          Assessment & Plan:   Problem List Items Addressed This Visit       Other   COVID-19 - Primary    S/p paxlovid treatment  Now persistent dry cough with exertional hypoxia and sob in pt with very high risk of pna (and aspiration)  Had temp last night, afebrile now  In the past- pneumonia usually comes on very quickly, heralded by fatigue and confusion (nl ms today) Some rales on exam cxr ordered stat  zpack and cefdinir ordered  Prednisone 30 mg taper for bronchitis  Update if not starting to improve in a week or if worsening   Will monitor closely        Relevant Medications   cefdinir (OMNICEF) 300 MG capsule   azithromycin (ZITHROMAX Z-PAK) 250 MG tablet   Other Relevant Orders   DG Chest 2 View (Completed)

## 2021-03-25 NOTE — Patient Instructions (Signed)
Stay hydrated and rest   Take the zpack and cefdinir and prednisone for coverage of pneumonia   The prednisone may make you feel jittery and hungry   Please keep [place] posted  Chest xray now  We will update you with a result   If you get worse let us know  If you have trouble breathing please call 911

## 2021-03-25 NOTE — Assessment & Plan Note (Signed)
S/p paxlovid treatment  Now persistent dry cough with exertional hypoxia and sob in pt with very high risk of pna (and aspiration)  Had temp last night, afebrile now  In the past- pneumonia usually comes on very quickly, heralded by fatigue and confusion (nl ms today) Some rales on exam cxr ordered stat  zpack and cefdinir ordered  Prednisone 30 mg taper for bronchitis  Update if not starting to improve in a week or if worsening   Will monitor closely

## 2021-03-31 ENCOUNTER — Other Ambulatory Visit: Payer: Self-pay | Admitting: General Surgery

## 2021-03-31 DIAGNOSIS — Z171 Estrogen receptor negative status [ER-]: Secondary | ICD-10-CM

## 2021-03-31 DIAGNOSIS — C50412 Malignant neoplasm of upper-outer quadrant of left female breast: Secondary | ICD-10-CM

## 2021-04-08 ENCOUNTER — Telehealth: Payer: Self-pay

## 2021-04-08 NOTE — Chronic Care Management (AMB) (Signed)
Chronic Care Management Pharmacy Assistant   Name: TITANIA GAULT  MRN: [id number] DOB: Jan 21, 1946   Reason for Encounter: Medication Adherence and Delivery Coordination  Recent office visits:  03/25/21- PCP- Hollice Espy- Patient presented for follow up covid-19.Chest xray,start cefdinir 300mg  take 1 capsule 2 times daily,zithromax 250mg  take 2 pills today then 1 a day for 4 days, prednisone 30mg  taper, 3 pills for 3 days then 2 pills for 3 days then 1 a day. 03/18/21- PCP- Roque Lias Tower,MD-Telemedicine-patient presented for cough and fever, covid 19 test positive.Start Paxlovid 100mg  tabs-take as directed- isolate and mask-monitor and update in a week   Recent consult visits:  None since last CCM contact  Hospital visits:  None in previous 6 months  Medications: Outpatient Encounter Medications as of 04/08/2021  Medication Sig   acetaminophen (TYLENOL) 325 MG tablet Take 2 tablets (650 mg total) by mouth every 6 (six) hours as needed for mild pain (or Fever >/= 101).   albuterol (VENTOLIN HFA) 108 (90 Base) MCG/ACT inhaler INHALE TWO PUFFS BY MOUTH EVERY SIX HOURS AS NEEDED   ALPRAZolam (XANAX) 0.5 MG tablet TAKE ONE TABLET BY MOUTH twice daily AS NEEDED FOR ANXIETY OR SLEEP   amitriptyline (ELAVIL) 50 MG tablet Take 1 tablet (50 mg total) by mouth at bedtime.   amLODipine (NORVASC) 5 MG tablet Take 1 tablet (5 mg total) by mouth daily.   atorvastatin (LIPITOR) 10 MG tablet TAKE 1 TABLET(10 MG) BY MOUTH DAILY   azithromycin (ZITHROMAX Z-PAK) 250 MG tablet Take 2 pills by mouth today and then 1 pill daily for 4 days   b complex vitamins tablet Take 1 tablet by mouth daily.   busPIRone (BUSPAR) 15 MG tablet Take 1 tablet in the morning and 1/2 tablet in the evening for one week, then increase to 1 tablet twice daily   cefdinir (OMNICEF) 300 MG capsule Take 1 capsule (300 mg total) by mouth 2 (two) times daily.   Cholecalciferol (VITAMIN D-1000 MAX ST) 25 MCG (1000 UT) tablet Take  2,000 Units by mouth daily. Takes three 50 mcg tablets, 3 capsules daily   Emollient (COLLAGEN EX)    ferrous sulfate 325 (65 FE) MG tablet Take 1 tablet (325 mg total) by mouth 2 (two) times daily with a meal.   glipiZIDE (GLUCOTROL) 5 MG tablet Take 5 mg by mouth daily before breakfast.   glucose blood (ONETOUCH ULTRA) test strip USE TO check blood glucose ONCE DAILY AND AS NEEDED FOR DIABETES   MELATONIN PO Take 10 mg by mouth.   Multiple Vitamins-Minerals (WOMENS MULTIVITAMIN PO) Take by mouth.   omeprazole (PRILOSEC) 20 MG capsule TAKE ONE CAPSULE BY MOUTH EVERYDAY AT BEDTIME   predniSONE (DELTASONE) 10 MG tablet Take 3 pills once daily by mouth for 3 days, then 2 pills once daily for 3 days, then 1 pill once daily for 3 days and then stop   pregabalin (LYRICA) 150 MG capsule Take 300 mg by mouth at bedtime.   rOPINIRole (REQUIP) 1 MG tablet TAKE 1/2 TABLET BY MOUTH EVERY MORNING and TAKE THREE TABLETS BY MOUTH EVERYDAY AT BEDTIME   sertraline (ZOLOFT) 100 MG tablet TAKE TWO TABLETS BY MOUTH EVERY MORNING   SitaGLIPtin-MetFORMIN HCl 50-1000 MG TB24 Take 1 tablet by mouth 2 (two) times daily.   traZODone (DESYREL) 50 MG tablet Take 2 tablets (100 mg total) by mouth at bedtime.   Facility-Administered Encounter Medications as of 04/08/2021  Medication   influenza vaccine adjuvanted (FLUAD) injection 0.5  mL   sodium chloride flush (NS) 0.9 % injection 10 mL    BP Readings from Last 3 Encounters:  03/25/21 (!) 156/82  12/11/20 109/73  11/26/20 (!) 148/70    Lab Results  Component Value Date   HGBA1C 6.5 (H) 12/11/2020      Recent OV, Consult or Hospital visit:  Recent medication changes indicated:  03/18/21 PCP- treatment for covid-19    Last adherence delivery date:03/14/21      Patient is due for next adherence delivery on: 04/15/21  Spoke with patient on 04/08/21 reviewed medications and coordinated delivery.  This delivery to include: Adherence Packaging  30 Days   Packs: Pregabalin 150mg - 2 daily (2 bedtime) Sertraline 100mg - 2 tablets daily (2-breakfast)  Ropinirole 1mg  -  tablet (breakfast) and 3 tablets nightly (bedtime)  Amitriptyline 50mg - 1 tablet daily (bedtime)  Atorvastatin 10mg - 1 tablet daily (breakfast)  Amlodipine 5mg - 1 tablet daily (breakfast)   Omeprazole 20mg - 1 tablet daily (bedtime)    VIAL medications: Alprazolam 0.5mg  take 1 tablet 2 times daily as needed for anxiety   Albuterol Inhaler- 2 puffs every 6 hours as needed  Patient declined the following medications this month: Onetouch ultra test strips- patient has supply on hand Ozempic 0.5mg  -inject once weekly(fridays)-patient has 1 month supply on hand from [facility]( has been ongoing supply issue )  Any concerns about your medications? No  How often do you forget or accidentally miss a dose? Never  Do you use a pillbox? No  Is patient in packaging Yes  What is the date on your next pill pack?   04/04/21  Any concerns or issues with your packaging?  Patient reports very happy with packaging process   No refill request needed.  Confirmed delivery date of 04/15/21, advised patient that pharmacy will contact them the morning of delivery.  Recent blood pressure readings are as follows: No readings available   Recent blood glucose readings are as follows: Fasting: ranging  90's 110, nothing higher than 120's    Annual wellness visit in last year? Yes Most Recent BP reading: 156/82 94-P  ( Patient was sick office 03/25/21)  If Diabetic: Most recent A1C reading:6.9 02/12/21 Last eye exam / retinopathy screening: UTD Last diabetic foot exam: UTD  Marjo Bicker CPP notified  Avel Sensor, [place] [phone]  Total time spent for month CPA: 40 min   Reminder Call  [name] was contacted to remind of upcoming telephone visit with Charlene Brooke on 04/10/21 at 3:45pm. Patient was reminded to have all medications,  supplements and any blood glucose and blood pressure readings available for review at appointment. If unable to reach, a voicemail was left for patient.    Are you having any problems with your medications? No   Do you have any concerns you like to discuss with the pharmacist? No    Star Rating Drugs: Medication:  Last Fill: Day Supply Glipizide 5mg   01/29/21 90 Atorvastatin 10mg  03/11/21 30 Ozempic 0.5mg   03/14/21 28 Metformin ER 500mg  02/06/21 [place] CPP notified  Avel Sensor, [place] Assistant [phone]  Total time spent for month CPA: 10 min

## 2021-04-10 ENCOUNTER — Other Ambulatory Visit: Payer: Self-pay

## 2021-04-10 ENCOUNTER — Ambulatory Visit (INDEPENDENT_AMBULATORY_CARE_PROVIDER_SITE_OTHER): Payer: PPO | Admitting: Pharmacist

## 2021-04-10 ENCOUNTER — Telehealth: Payer: PPO

## 2021-04-10 DIAGNOSIS — G2581 Restless legs syndrome: Secondary | ICD-10-CM

## 2021-04-10 DIAGNOSIS — I10 Essential (primary) hypertension: Secondary | ICD-10-CM

## 2021-04-10 DIAGNOSIS — G63 Polyneuropathy in diseases classified elsewhere: Secondary | ICD-10-CM

## 2021-04-10 DIAGNOSIS — C801 Malignant (primary) neoplasm, unspecified: Secondary | ICD-10-CM

## 2021-04-10 DIAGNOSIS — E119 Type 2 diabetes mellitus without complications: Secondary | ICD-10-CM

## 2021-04-10 DIAGNOSIS — E7800 Pure hypercholesterolemia, unspecified: Secondary | ICD-10-CM

## 2021-04-10 NOTE — Patient Instructions (Signed)
Visit Information  Phone number for Pharmacist: [phone]   Goals Addressed             This Visit's Progress    Track and Manage My Blood Pressure-Hypertension       Timeframe:  Long-Range Goal Priority:  Medium Start Date:      04/10/21                       Expected End Date:  04/10/22                     Follow Up Date August 2023   - check blood pressure 3 times per week - write blood pressure results in a log or diary    Why is this important?   You won't feel high blood pressure, but it can still hurt your blood vessels.  High blood pressure can cause heart or kidney problems. It can also cause a stroke.  Making lifestyle changes like losing a little weight or eating less salt will help.  Checking your blood pressure at home and at different times of the day can help to control blood pressure.  If the doctor prescribes medicine remember to take it the way the doctor ordered.  Call the office if you cannot afford the medicine or if there are questions about it.     Notes:         Care Plan : [place]  Updates made by Charlton Haws, RPH since 04/10/2021 12:00 AM     Problem: Hypertension, Hyperlipidemia, and Diabetes   Priority: High     Long-Range Goal: Disease mgmt   Start Date: 04/10/2021  Expected End Date: 04/10/2022  This Visit's Progress: On track  Priority: High  Note:   Current Barriers:  Unable to independently monitor therapeutic efficacy  Pharmacist Clinical Goal(s):  Patient will achieve adherence to monitoring guidelines and medication adherence to achieve therapeutic efficacy through collaboration with PharmD and provider.   Interventions: 1:1 collaboration with Tower, [name], MD regarding development and update of comprehensive plan of care as evidenced by provider attestation and co-signature Inter-disciplinary care team collaboration (see longitudinal plan of care) Comprehensive medication review performed;  medication list updated in electronic medical record  Hypertension (BP goal <130/80) -Unknown control - pt is not checking BP at home; BP was high in recent OV due to illness; Denies hypotensive/hypertensive symptoms -Current treatment: Amlodipine 5 mg daily - Appropriate, Effective, Safe, Accessible -Educated on BP goals and benefits of medications for prevention of heart attack, stroke and kidney damage; Importance of home blood pressure monitoring; -Counseled to monitor BP at home daily, document, and provide log at future appointments -Recommended to continue current medication  Hyperlipidemia: (LDL goal < 70) -Controlled - LDL is at goal; pt is compliant with pill packs -Current treatment: Atorvastatin 10 mg daily - Appropriate, Effective, Safe, Accessible -Educated on Cholesterol goals; Benefits of statin for ASCVD risk reduction; -Recommended to continue current medication  Diabetes (A1c goal <7%) -Controlled - A1c is at goal; pt endorses compliance with medications -Pt follows with [facility] -Current home glucose readings fasting glucose: 90-110s -Current medications: Glipizide 5 mg daily -Appropriate, Effective, Safe, Accessible Ozempic 0.5 mg weekly ([facility]) -Medications previously tried: Janumet (cost), metformin (diarrhea)  -Educated on A1c and blood sugar goals; Benefits of routine self-monitoring of blood sugar; -Counseled to check feet daily and get yearly eye exams -Recommended to continue current medication  Depression/Anxiety (Goal:  manage symptoms) -Controlled -PHQ9: 0 - minimal depression -Connected with Wayne Both, PMHNP for mental health support -Current treatment: Sertraline 100 mg - 2 tab daily Alprazolam 0.5 mg BID prn Buspirone 15 mg BID Amitriptyline 50 mg HS Trazodone 50 mg - 2 tab HS -Medications previously tried/failed: citalopram, duloxetine, mirtazapine -Educated on Benefits of medication for symptom control -Recommended to  continue current medication  Neuropathy / RLS (Goal: manage symptoms) -Controlled - per pt report -Current treatment  Pregabalin 150 mg - 2 cap HS Ropinirole 1 mg - 1/2 tab AM, 3 tab HS Ferrous sulfate 325 mg daily -Recommended to continue current medication  Health Maintenance -Vaccine gaps: Pneumovax, Shingrix, covid booster -Current therapy:  Zinc 50 mg  -Recommended to continue current medication  Patient Goals/Self-Care Activities Patient will:  - take medications as prescribed as evidenced by patient report and record review focus on medication adherence by pill packs check glucose daily, document, and provide at future appointments check blood pressure daily, document, and provide at future appointments       Patient verbalizes understanding of instructions and care plan provided today and agrees to view in [place]. Active MyChart status confirmed with patient.   Telephone follow up appointment with pharmacy team member scheduled for: 6 months  Charlene Brooke, PharmD, [facility] Clinical Pharmacist [facility] at [facility] [phone]

## 2021-04-10 NOTE — Progress Notes (Signed)
Chronic Care Management Pharmacy Note  04/10/2021 Name:  Suzanne Strong MRN:  [id number] DOB:  [birth year]  Summary: CCM F/U visit -Pt endorses compliances with medications as prescribed -Pt was taken off metformin and added Ozempic 01/2021 per endocrine. Fasting BG is 90-120 and she denies issues  Recommendations/Changes made from today's visit: -No med changes; updated med list with Ozempic  Plan: -[place] will call patient 3 months for BP log -Pharmacist follow up televisit scheduled for 6 months -AWV due 10/2021   Subjective: Suzanne Strong is an 76 y.o. year old female who is a primary patient of Tower, [name], MD.  The CCM team was consulted for assistance with disease management and care coordination needs.    Engaged with patient by telephone for follow up visit in response to provider referral for pharmacy case management and/or care coordination services.   Consent to Services:  The patient was given information about Chronic Care Management services, agreed to services, and gave verbal consent prior to initiation of services.  Please see initial visit note for detailed documentation.   Patient Care Team: Tower, [name], MD as PCP - General Byrnett, [name], MD (General Surgery) [name], MD (Oncology) Alda Berthold, DO as Consulting Physician (Neurology) Cammie Sickle, MD as Consulting Physician (Oncology) [name], [facility] (Pharmacist) Noreene Filbert, MD as Radiation Oncologist (Radiation Oncology)  Recent office visits: 03/25/21- PCP- Hollice Espy- Patient presented for follow up covid-19.Chest xray,start cefdinir 340m take 1 capsule 2 times daily,zithromax 2562mtake 2 pills today then 1 a day for 4 days, prednisone 3031maper, 3 pills for 3 days then 2 pills for 3 days then 1 a day. 03/18/21- PCP- MarRoque Liaswer,MD VV -patient presented for cough and fever, covid 19 test positive.Start Paxlovid 100m75mbs-take as directed-  isolate and mask-monitor and update in a week   Recent consult visits: 02/12/21 Dr SoluGabriel Carinadocrine): f/u DM. A1c 6.9%. Stop metformin. Start Ozempic 0.25 mg weekly.   Hospital visits: None in previous 6 months   Objective:  Lab Results  Component Value Date   CREATININE 0.92 12/27/2020   BUN 13 12/27/2020   GFR 61.97 12/27/2020   GFRNONAA >60 12/11/2020   GFRAA >60 10/20/2019   NA 140 12/27/2020   K 4.4 12/27/2020   CALCIUM 8.4 12/27/2020   CO2 30 12/27/2020   GLUCOSE 96 12/27/2020    Lab Results  Component Value Date/Time   HGBA1C 6.5 (H) 12/11/2020 03:42 AM   HGBA1C 6.7 (H) 05/28/2020 10:38 PM   HGBA1C 7.3 12/09/2016 12:00 AM   GFR 61.97 12/27/2020 01:58 PM   GFR 64.67 07/26/2020 08:06 AM    Last diabetic Eye exam:  Lab Results  Component Value Date/Time   HMDIABEYEEXA No Retinopathy 05/02/2020 12:00 AM    Last diabetic Foot exam: No results found for: HMDIABFOOTEX   Lab Results  Component Value Date   CHOL 121 02/16/2020   HDL 48 02/16/2020   LDLCALC 57 02/16/2020   LDLDIRECT 97.0 06/29/2017   TRIG 78 02/16/2020   CHOLHDL 2.5 02/16/2020    Hepatic Function Latest Ref Rng & Units 12/27/2020 12/11/2020 12/10/2020  Total Protein 6.0 - 8.3 g/dL 7.1 6.4(L) 6.3(L)  Albumin 3.5 - 5.2 g/dL 3.5 2.7(L) 2.7(L)  AST 0 - 37 U/L 18 112(H) 153(H)  ALT 0 - 35 U/L 9 35 35  Alk Phosphatase 39 - 117 U/L 77 43 43  Total Bilirubin 0.2 - 1.2 mg/dL 0.2 0.8 1.0  Bilirubin,  Direct 0.0 - 0.2 mg/dL - - 0.2    Lab Results  Component Value Date/Time   TSH 2.57 06/18/2020 11:04 AM   TSH 2.50 07/15/2018 09:27 AM    CBC Latest Ref Rng & Units 12/27/2020 12/11/2020 12/10/2020  WBC 4.0 - 10.5 K/uL 4.6 8.7 15.7(H)  Hemoglobin 12.0 - 15.0 g/dL 10.0(L) 9.3(L) 12.2  Hematocrit 36.0 - 46.0 % 31.8(L) 29.7(L) 38.2  Platelets 150.0 - 400.0 K/uL 316.0 159 160    Lab Results  Component Value Date/Time   VD25OH 38.72 06/18/2020 11:04 AM   VD25OH 29.74 (L) 07/15/2018 09:27 AM     Clinical ASCVD: No  The ASCVD Risk score (Arnett DK, et al., 2019) failed to calculate for the following reasons:   The valid total cholesterol range is 130 to 320 mg/dL    Depression screen [facility] 2/9 11/09/2020 07/15/2018 06/29/2017  Decreased Interest 0 0 0  Down, Depressed, Hopeless 0 0 0  PHQ - 2 Score 0 0 0  Altered sleeping - 0 0  Tired, decreased energy - 0 0  Change in appetite - 0 0  Feeling bad or failure about yourself  - 0 0  Trouble concentrating - 0 0  Moving slowly or fidgety/restless - 0 0  Suicidal thoughts - 0 0  PHQ-9 Score - 0 0  Difficult doing work/chores - Not difficult at all Not difficult at all  Some recent data might be hidden     Social History   Tobacco Use  Smoking Status Former   Packs/day: 0.50   Years: 6.00   Pack years: 3.00   Types: Cigarettes   Quit date: 03/10/1984   Years since quitting: 37.1  Smokeless Tobacco Never   BP Readings from Last 3 Encounters:  03/25/21 (!) 156/82  12/11/20 109/73  11/26/20 (!) 148/70   Pulse Readings from Last 3 Encounters:  03/25/21 94  12/11/20 90  11/26/20 71   Wt Readings from Last 3 Encounters:  03/25/21 208 lb 6 oz (94.5 kg)  12/09/20 206 lb (93.4 kg)  11/26/20 216 lb 6.4 oz (98.2 kg)   BMI Readings from Last 3 Encounters:  03/25/21 33.63 kg/m  12/09/20 33.25 kg/m  11/26/20 34.93 kg/m    Assessment/Interventions: Review of patient past medical history, allergies, medications, health status, including review of consultants reports, laboratory and other test data, was performed as part of comprehensive evaluation and provision of chronic care management services.   SDOH:  (Social Determinants of Health) assessments and interventions performed: No - assessed 10/2020 at [place]   Alcohol Screen: Low Risk    Last Alcohol Screening Score (AUDIT): 0  Depression (PHQ2-9): Low Risk    PHQ-2 Score: 0  Financial Resource Strain: Low Risk    Difficulty of Paying Living Expenses:  Not very hard  Food Insecurity: No Food Insecurity   Worried About Charity fundraiser in the Last Year: Never true   Ran Out of Food in the Last Year: Never true  Housing: Low Risk    Last Housing Risk Score: 0  Physical Activity: Sufficiently Active   Days of Exercise per Week: 4 days   Minutes of Exercise per Session: 40 min  Social Connections: Moderately Integrated   Frequency of Communication with Friends and Family: Not on file   Frequency of Social Gatherings with Friends and Family: More than three times a week   Attends Religious Services: More than 4 times per year   Active Member of Clubs or  Organizations: Yes   Attends Music therapist: More than 4 times per year   Marital Status: Widowed  Stress: No Stress Concern Present   Feeling of Stress : Not at all  Tobacco Use: Medium Risk   Smoking Tobacco Use: Former   Smokeless Tobacco Use: Never   Passive Exposure: Not on file  Transportation Needs: No Transportation Needs   Lack of Transportation (Medical): No   Lack of Transportation (Non-Medical): No    CCM Care Plan  Allergies  Allergen Reactions   Amoxicillin Swelling    REACTION: rash, swelling Has patient had a PCN reaction causing immediate rash, facial/tongue/throat swelling, SOB or lightheadedness with hypotension: yes Has patient had a PCN reaction causing severe rash involving mucus membranes or skin necrosis: no Has patient had a PCN reaction that required hospitalization: no Has patient had a PCN reaction occurring within the last 10 years: yes If all of the above answers are "NO", then may proceed with Cephalosporin use.  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 Sig Documenting Provider Last Dose Status Informant  acetaminophen (TYLENOL) 325 MG tablet [id number] Yes Take 2 tablets (650 mg total) by mouth every 6 (six) hours as needed for mild pain (or Fever >/= 101). Domenic Polite, MD Taking Active Self  albuterol (VENTOLIN HFA) 108 (90 Base) MCG/ACT inhaler [id number] Yes INHALE TWO PUFFS BY MOUTH EVERY SIX HOURS AS NEEDED Tower, [name], MD Taking Active Self  ALPRAZolam Duanne Moron) 0.5 MG tablet [id number] Yes TAKE ONE TABLET BY MOUTH twice daily AS NEEDED FOR ANXIETY OR SLEEP Thayer Headings, PMHNP Taking Active   amitriptyline (ELAVIL) 50 MG tablet [id number] Yes Take 1 tablet (50 mg total) by mouth at bedtime. Cammie Sickle, MD Taking Active Self  amLODipine (NORVASC) 5 MG tablet [id number] Yes Take 1 tablet (5 mg total) by mouth daily. Tower, [name], MD Taking Active Self  atorvastatin (LIPITOR) 10 MG tablet [id number] Yes TAKE 1 TABLET(10 MG) BY MOUTH DAILY Tower, [name], MD Taking Active Self  b complex vitamins tablet [id number] Yes Take 1 tablet by mouth daily. [provider] Taking Active Self  busPIRone (BUSPAR) 15 MG tablet [id number] Yes Take 1 tablet in the morning and 1/2 tablet in the evening for one week, then increase to 1 tablet twice daily Thayer Headings, PMHNP Taking Active Self  Cholecalciferol (VITAMIN D-1000 MAX ST) 25 MCG (1000 UT) tablet [id number] Yes Take 2,000 Units by mouth daily. Takes three 50 mcg tablets, 3 capsules daily [provider] Taking Active Self  Emollient (COLLAGEN EX) [id number] Yes  [provider] Taking Active Self  ferrous sulfate 325 (65 FE) MG tablet [id number] Yes Take 1 tablet (325 mg total) by mouth 2 (two) times daily with a meal. Domenic Polite, MD Taking Active Self  glipiZIDE (GLUCOTROL) 5 MG tablet [id number] Yes Take 5 mg by mouth daily before  breakfast. [provider] Taking Active Self  glucose blood (ONETOUCH ULTRA) test strip [id number] Yes USE TO check blood glucose ONCE DAILY AND AS  NEEDED FOR DIABETES Tower, [name], MD Taking Active Self  MELATONIN PO [id number] Yes Take 10 mg by mouth. [provider] Taking Active Self  Multiple Vitamins-Minerals (WOMENS MULTIVITAMIN PO) [id number] Yes Take by mouth. [provider] Taking Active Self  omeprazole (PRILOSEC) 20 MG capsule [id number] Yes TAKE ONE CAPSULE BY MOUTH EVERYDAY AT BEDTIME Tower, [name], MD Taking Active Self  pregabalin (LYRICA) 150 MG capsule [id number] Yes Take 300 mg by mouth at bedtime. [provider] Taking Active Self  rOPINIRole (REQUIP) 1 MG tablet [id number] Yes TAKE 1/2 TABLET BY MOUTH EVERY MORNING and TAKE THREE TABLETS BY MOUTH EVERYDAY AT BEDTIME Tower, [name], MD Taking Active Self  Semaglutide,0.25 or 0.5MG/DOS, (OZEMPIC, 0.25 OR 0.5 MG/DOSE,) 2 MG/1.5ML SOPN [id number] Yes Inject 0.5 mg into the skin once a week. [provider] Taking Active   sertraline (ZOLOFT) 100 MG tablet [id number] Yes TAKE TWO TABLETS BY MOUTH EVERY MORNING Thayer Headings, PMHNP Taking Active   traZODone (DESYREL) 50 MG tablet [id number] Yes Take 2 tablets (100 mg total) by mouth at bedtime. Thayer Headings, PMHNP Taking Active   zinc gluconate 50 MG tablet [id number] Yes Take 50 mg by mouth daily. [provider] Taking Active             Patient Active Problem List   Diagnosis Date Noted   COVID-19 03/18/2021   Rhabdomyolysis 12/20/2020   Aspiration pneumonia ([place]) 12/09/2020   Dysuria 07/17/2020   Dysphagia    Gastric erythema    Gastric polyp    Encounter for screening colonoscopy    Polyp of colon    History of aspiration pneumonia 02/15/2020   Diabetes mellitus without complication ([place])    GERD (gastroesophageal reflux disease)    Hypertension    Urinary urgency 01/05/2020   Grief reaction 09/18/2019    Swallowing dysfunction 07/18/2019   Cough 02/21/2019   Fever, intermittent 09/01/2018   Pre-syncope 02/07/2018   Orthostatic hypotension 01/26/2018   Episodic weakness 01/26/2018   Medicare annual wellness visit, subsequent 06/29/2017   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   Preoperative examination 04/27/2017   Carpal tunnel syndrome of right wrist 11/13/2016   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[place]) History of  02/17/2016   Obstructive sleep apnea 01/01/2016   Carcinoma of upper-outer quadrant of left breast in female, estrogen receptor negative ([place]) 11/21/2015   Hypomagnesemia 08/28/2015   Fever 04/11/2015   Initial Medicare annual wellness visit 10/16/2014   Estrogen deficiency 10/16/2014   Colon cancer screening 10/16/2014   Controlled type 2 diabetes mellitus without complication ([place]) 75/91/6384   RLS (restless legs syndrome) 02/21/2014   Chronic neck pain 07/25/2010   Depression with anxiety 07/07/2010   Routine general medical examination at a health care facility 06/26/2010   B12 deficiency 09/04/2009   Anemia, iron deficiency 08/29/2009   Anxiety state 08/26/2009   GASTROPARESIS 08/26/2009   Vitamin D deficiency 07/19/2008   Disorder of bone and cartilage 07/19/2007   Hyperlipidemia 07/18/2007   EDEMA 07/18/2007   Skin lesion, superficial 06/02/2007    Immunization History  Administered Date(s) Administered   Fluad Quad(high Dose 65+) 11/26/2020   PFIZER(Purple Top)SARS-COV-2 Vaccination 03/30/2019, 04/20/2019, 11/11/2019   Pneumococcal Conjugate-13 10/16/2014   Pneumococcal Polysaccharide-23 08/31/2012   Td 06/16/2001   Tdap 06/01/2012    Conditions to be addressed/monitored:  Hypertension, Hyperlipidemia, and Diabetes  Care Plan : [place]  Updates made by Charlton Haws, [place] since 04/10/2021 12:00 AM     Problem: Hypertension,  Hyperlipidemia, and Diabetes   Priority: High     Long-Range Goal: Disease mgmt   Start Date: 04/10/2021   Expected End Date: 04/10/2022  This Visit's Progress: On track  Priority: High  Note:   Current Barriers:  Unable to independently monitor therapeutic efficacy  Pharmacist Clinical Goal(s):  Patient will achieve adherence to monitoring guidelines and medication adherence to achieve therapeutic efficacy through collaboration with PharmD and provider.   Interventions: 1:1 collaboration with Tower, [name], MD regarding development and update of comprehensive plan of care as evidenced by provider attestation and co-signature Inter-disciplinary care team collaboration (see longitudinal plan of care) Comprehensive medication review performed; medication list updated in electronic medical record  Hypertension (BP goal <130/80) -Unknown control - pt is not checking BP at home; BP was high in recent OV due to illness; Denies hypotensive/hypertensive symptoms -Current treatment: Amlodipine 5 mg daily - Appropriate, Effective, Safe, Accessible -Educated on BP goals and benefits of medications for prevention of heart attack, stroke and kidney damage; Importance of home blood pressure monitoring; -Counseled to monitor BP at home daily, document, and provide log at future appointments -Recommended to continue current medication  Hyperlipidemia: (LDL goal < 70) -Controlled - LDL is at goal; pt is compliant with pill packs -Current treatment: Atorvastatin 10 mg daily - Appropriate, Effective, Safe, Accessible -Educated on Cholesterol goals; Benefits of statin for ASCVD risk reduction; -Recommended to continue current medication  Diabetes (A1c goal <7%) -Controlled - A1c is at goal; pt endorses compliance with medications -Pt follows with [facility] -Current home glucose readings fasting glucose: 90-110s -Current medications: Glipizide 5 mg daily -Appropriate, Effective, Safe, Accessible Ozempic 0.5 mg weekly ([facility]) -Medications previously tried: Janumet (cost), metformin (diarrhea)   -Educated on A1c and blood sugar goals; Benefits of routine self-monitoring of blood sugar; -Counseled to check feet daily and get yearly eye exams -Recommended to continue current medication  Depression/Anxiety (Goal: manage symptoms) -Controlled -PHQ9: 0 - minimal depression -Connected with Wayne Both, PMHNP for mental health support -Current treatment: Sertraline 100 mg - 2 tab daily Alprazolam 0.5 mg BID prn Buspirone 15 mg BID Amitriptyline 50 mg HS Trazodone 50 mg - 2 tab HS -Medications previously tried/failed: citalopram, duloxetine, mirtazapine -Educated on Benefits of medication for symptom control -Recommended to continue current medication  Neuropathy / RLS (Goal: manage symptoms) -Controlled - per pt report -Current treatment  Pregabalin 150 mg - 2 cap HS Ropinirole 1 mg - 1/2 tab AM, 3 tab HS Ferrous sulfate 325 mg daily -Recommended to continue current medication  Health Maintenance -Vaccine gaps: Pneumovax, Shingrix, covid booster -Current therapy:  Zinc 50 mg  -Recommended to continue current medication  Patient Goals/Self-Care Activities Patient will:  - take medications as prescribed as evidenced by patient report and record review focus on medication adherence by pill packs check glucose daily, document, and provide at future appointments check blood pressure daily, document, and provide at future appointments       Medication Assistance: None required.  Patient affirms current coverage meets needs.  Compliance/Adherence/Medication fill history: Care Gaps: Foot exam (due 07/06/18)  Star-Rating Drugs: Atorvastatin 10 mg - PDC 100% Janumet - PDC unavailable; LF 12/09/20 x 30 ds Glipizide - PDC 99%  Patient's preferred pharmacy is:  [facility] [address] [place] - [place] [address] Phone: [phone] Fax: [phone]  [facility] - [place], [place] - [address] [place] [address]  Phone: [phone] Fax: [phone]  Uses pill box? Yes - pill packs Pt endorses 100% compliance  We discussed: Current pharmacy is preferred with insurance plan and patient is satisfied with pharmacy services Patient decided to: Utilize [facility] for medication synchronization, packaging and delivery  Care Plan and Follow Up Patient Decision:  Patient agrees to Care Plan and Follow-up.  Plan: Telephone follow up appointment with care management team member scheduled for:  6 months  Charlene Brooke, PharmD, BCACP Clinical Pharmacist [facility] at [facility] [phone]

## 2021-04-14 ENCOUNTER — Telehealth: Payer: PPO

## 2021-04-15 DIAGNOSIS — E119 Type 2 diabetes mellitus without complications: Secondary | ICD-10-CM | POA: Diagnosis not present

## 2021-04-15 DIAGNOSIS — I10 Essential (primary) hypertension: Secondary | ICD-10-CM | POA: Diagnosis not present

## 2021-04-15 DIAGNOSIS — E7800 Pure hypercholesterolemia, unspecified: Secondary | ICD-10-CM

## 2021-04-23 ENCOUNTER — Encounter: Payer: Self-pay | Admitting: Family Medicine

## 2021-04-23 ENCOUNTER — Ambulatory Visit: Payer: PPO | Admitting: Family Medicine

## 2021-04-23 ENCOUNTER — Other Ambulatory Visit: Payer: Self-pay

## 2021-04-23 ENCOUNTER — Ambulatory Visit (INDEPENDENT_AMBULATORY_CARE_PROVIDER_SITE_OTHER): Payer: PPO | Admitting: Family Medicine

## 2021-04-23 DIAGNOSIS — R197 Diarrhea, unspecified: Secondary | ICD-10-CM | POA: Diagnosis not present

## 2021-04-23 DIAGNOSIS — Z9189 Other specified personal risk factors, not elsewhere classified: Secondary | ICD-10-CM | POA: Diagnosis not present

## 2021-04-23 DIAGNOSIS — R112 Nausea with vomiting, unspecified: Secondary | ICD-10-CM | POA: Insufficient documentation

## 2021-04-23 NOTE — Assessment & Plan Note (Signed)
This happened last week and is fully resolved ?Pt has a history of recurrent aspiration pneumonia, reviewed s/s to watch for  (increased cough/weakness) ?inst to keep wearing her med alert button  ?May have been viral vs rxn to her semaglutide ?Signs of dehydration (change in skin turgor and also tachycardia) ?Otherwise baseline exam  ?Discussed guidelines for re hydration  ?Will monitor closely  ?Update if not starting to improve in a week or if worsening   ?

## 2021-04-23 NOTE — Patient Instructions (Signed)
Keep your life alert on  ? ?Push fluids - I think you are a little low  ? ?Eat bland until you feel entirely normal  ? ?Watch closely for signs/symptoms of pneumonia and let us know immediately if that happens ? ?If the nausea/vomiting and diarrhea return let us know  ? ? ?

## 2021-04-23 NOTE — Assessment & Plan Note (Signed)
Due to radiation in the past  ? ?Pt had n/v/d last week  ?No signs of pna now but need to monitor very closely  ?She wears a medi alert button  ?

## 2021-04-23 NOTE — Progress Notes (Signed)
Subjective:    Patient ID: Suzanne Strong, female    DOB: 1945-11-29, 76 y.o.   MRN: [id number]  This visit occurred during the SARS-CoV-2 public health emergency.  Safety protocols were in place, including screening questions prior to the visit, additional usage of staff PPE, and extensive cleaning of exam room while observing appropriate contact time as indicated for disinfecting solutions.   HPI Pt presents with possible pneumonia  Wt Readings from Last 3 Encounters:  04/23/21 208 lb (94.3 kg)  03/25/21 208 lb 6 oz (94.5 kg)  12/09/20 206 lb (93.4 kg)   33.57 kg/m  She has h/o recurrent aspiration pneumonia (due to post radiation throat changes)   Last week had n/v/d For a week  No sick contacts that she knew of  Not very much vomiting but she did vomit  (dry heaves_ No abd pain  No blood in stool   Had temp for a few days    Her semaglutide dose went up- ? If that added   Began symptoms Sunday at 2 am -dry and then gurgly cough  Felt bad and it passed   Today she feels about nl but a little tired  No more nausea  No sore throat   A little of her chronic cough /always sounds like a gurgle  No wheeze  No sob   Patient Active Problem List   Diagnosis Date Noted   Nausea vomiting and diarrhea 04/23/2021   COVID-19 03/18/2021   Rhabdomyolysis 12/20/2020   Aspiration pneumonia ([place]) 12/09/2020   Dysuria 07/17/2020   Dysphagia    Gastric erythema    Gastric polyp    Encounter for screening colonoscopy    Polyp of colon    History of aspiration pneumonia 02/15/2020   Diabetes mellitus without complication (HCC)    GERD (gastroesophageal reflux disease)    Hypertension    Urinary urgency 01/05/2020   Grief reaction 09/18/2019   Swallowing dysfunction 07/18/2019   Cough 02/21/2019   Fever, intermittent 09/01/2018   Pre-syncope 02/07/2018   Orthostatic hypotension 01/26/2018   Episodic weakness 01/26/2018   Medicare annual wellness visit, subsequent  06/29/2017   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   Preoperative examination 04/27/2017   Carpal tunnel syndrome of right wrist 11/13/2016   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  02/17/2016   Obstructive sleep apnea 01/01/2016   Carcinoma of upper-outer quadrant of left breast in female, estrogen receptor negative ([place]) 11/21/2015   Hypomagnesemia 08/28/2015   Fever 04/11/2015   Initial Medicare annual wellness visit 10/16/2014   Estrogen deficiency 10/16/2014   Colon cancer screening 10/16/2014   Controlled type 2 diabetes mellitus without complication ([place]) 97/03/6376   RLS (restless legs syndrome) 02/21/2014   Chronic neck pain 07/25/2010   Depression with anxiety 07/07/2010   Routine general medical examination at a health care facility 06/26/2010   B12 deficiency 09/04/2009   Anemia, iron deficiency 08/29/2009   Anxiety state 08/26/2009   GASTROPARESIS 08/26/2009   Vitamin D deficiency 07/19/2008   Disorder of bone and cartilage 07/19/2007   Hyperlipidemia 07/18/2007   EDEMA 07/18/2007   Skin lesion, superficial 06/02/2007   Past Medical History:  Diagnosis Date   Anemia    iron deficiency and B12 deficiency   Anxiety    Breast cancer ([place]) 02/25/2015   upper-outer quadrant of left female breast, Triple Negative. Neo-adjuvant chemo with complete pathologic response.    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   BREAST CYST ASPIRATION     BREAST EXCISIONAL BIOPSY Left 12/2015   surgery   BREAST LUMPECTOMY WITH NEEDLE LOCALIZATION Left 10/02/2015   Procedure: BREAST LUMPECTOMY WITH NEEDLE LOCALIZATION;  Surgeon: [name], MD;  Location: [facility];  Service: General;  Laterality: Left;   BREAST LUMPECTOMY WITH SENTINEL LYMPH NODE BIOPSY Left 10/02/2015   Procedure: LEFT BREAST WIDE EXCISION WITH SENTINEL LYMPH NODE BX;  Surgeon: [name], MD;  Location: [facility];  Service: General;  Laterality: Left;   BREAST SURGERY     breast biopsy benign   CARPAL TUNNEL RELEASE Right 05/19/2017   Procedure: CARPAL TUNNEL RELEASE;  Surgeon: Earnestine Leys, MD;  Location: [facility];  Service: Orthopedics;  Laterality: Right;   CATARACT EXTRACTION W/PHACO Right 10/26/2018   Procedure: CATARACT EXTRACTION PHACO AND INTRAOCULAR LENS PLACEMENT ([place]) right diabetic;  Surgeon: Leandrew Koyanagi, MD;  Location: [facility];  Service: Ophthalmology;  Laterality: Right;  Diabetic - oral meds cancer center been notified and will come   CATARACT EXTRACTION W/PHACO Left 11/16/2018   Procedure: CATARACT EXTRACTION PHACO AND INTRAOCULAR LENS PLACEMENT ([facility]) LEFT DIABETIC  01:01.0  17.0%  10.37;  Surgeon: Leandrew Koyanagi, MD;  Location: [facility];  Service: Ophthalmology;  Laterality: Left;  Diabetic - oral meds Port-a-cath   CHOLECYSTECTOMY     Cologuard  07/22/2016   Negative   COLONOSCOPY WITH PROPOFOL N/A 05/24/2020   Procedure: COLONOSCOPY WITH PROPOFOL;  Surgeon: Virgel Manifold, MD;  Location: [facility];  Service: Endoscopy;  Laterality: N/A;   ESOPHAGOGASTRODUODENOSCOPY  07/2009   normal with few gastric polyps   ESOPHAGOGASTRODUODENOSCOPY (EGD) WITH PROPOFOL N/A 05/24/2020   Procedure: ESOPHAGOGASTRODUODENOSCOPY (EGD) WITH PROPOFOL;  Surgeon: Virgel Manifold, MD;  Location: [facility];   Service: Endoscopy;  Laterality: N/A;   MOLE REMOVAL     PORT-A-CATH REMOVAL     PORTACATH PLACEMENT     PORTACATH PLACEMENT Right 03/12/2015   Procedure: INSERTION PORT-A-CATH;  Surgeon: [name], MD;  Location: [facility];  Service: General;  Laterality: Right;   RADICAL NECK DISSECTION     TONSILLECTOMY     cancer treated with chemo and radiation   TRIGGER FINGER RELEASE Right 05/19/2017   Procedure: RELEASE TRIGGER FINGER/A-1 PULLEY ring finger;  Surgeon: Earnestine Leys, MD;  Location: [facility];  Service: Orthopedics;  Laterality: Right;   Social History   Tobacco Use   Smoking status: Former    Packs/day: 0.50    Years: 6.00    Pack years: 3.00    Types: Cigarettes    Quit date: 03/10/1984    Years since quitting: 37.1   Smokeless tobacco: Never  Vaping Use   Vaping Use: Never used  Substance Use Topics   Alcohol use: No    Alcohol/week:  0.0 standard drinks   Drug use: No   Family History  Problem Relation Age of Onset   Osteoporosis Mother    Hyperlipidemia Mother    Depression Mother    Cancer Mother        skin cancer ? basal cell and lung ca heavy smoker   Alcohol abuse Father    Cancer Father        skin CA ? basal cell   Heart disease Father        CHF   Hyperlipidemia Brother    Hypertension Brother    Breast cancer Neg Hx    Allergies  Allergen Reactions   Amoxicillin Swelling    REACTION: rash, swelling Has patient had a PCN reaction causing immediate rash, facial/tongue/throat swelling, SOB or lightheadedness with hypotension: yes Has patient had a PCN reaction causing severe rash involving mucus membranes or skin necrosis: no Has patient had a PCN reaction that required hospitalization: no Has patient had a PCN reaction occurring within the last 10 years: yes If all of the above answers are "NO", then may proceed with Cephalosporin use.  Has tolerated ceftriaxone   Penicillin G Anaphylaxis    Has tolerated ceftriaxone on multiple occasions     Fluconazole Rash    REACTION: hives   Difuleron [Ferrous Fumarate-Dss]    Other Other (See Comments)    Pt has had tonsil cancer (on Left side) - pt may have difficulty swallowing   Current Outpatient Medications on File Prior to Visit  Medication Sig Dispense Refill   acetaminophen (TYLENOL) 325 MG tablet Take 2 tablets (650 mg total) by mouth every 6 (six) hours as needed for mild pain (or Fever >/= 101).     albuterol (VENTOLIN HFA) 108 (90 Base) MCG/ACT inhaler INHALE TWO PUFFS BY MOUTH EVERY SIX HOURS AS NEEDED 8.5 g 3   ALPRAZolam (XANAX) 0.5 MG tablet TAKE ONE TABLET BY MOUTH twice daily AS NEEDED FOR ANXIETY OR SLEEP 60 tablet 2   amitriptyline (ELAVIL) 50 MG tablet Take 1 tablet (50 mg total) by mouth at bedtime. 90 tablet 3   amLODipine (NORVASC) 5 MG tablet Take 1 tablet (5 mg total) by mouth daily. 90 tablet 3   atorvastatin (LIPITOR) 10 MG tablet TAKE 1 TABLET(10 MG) BY MOUTH DAILY 90 tablet 3   b complex vitamins tablet Take 1 tablet by mouth daily.     busPIRone (BUSPAR) 15 MG tablet Take 1 tablet in the morning and 1/2 tablet in the evening for one week, then increase to 1 tablet twice daily 180 tablet 1   Cholecalciferol (VITAMIN D-1000 MAX ST) 25 MCG (1000 UT) tablet Take 2,000 Units by mouth daily. Takes three 50 mcg tablets, 3 capsules daily     Emollient (COLLAGEN EX)      ferrous sulfate 325 (65 FE) MG tablet Take 1 tablet (325 mg total) by mouth 2 (two) times daily with a meal.     glipiZIDE (GLUCOTROL) 5 MG tablet Take 5 mg by mouth daily before breakfast.     glucose blood (ONETOUCH ULTRA) test strip USE TO check blood glucose ONCE DAILY AND AS NEEDED FOR DIABETES 100 strip 1   MELATONIN PO Take 10 mg by mouth.     Multiple Vitamins-Minerals (WOMENS MULTIVITAMIN PO) Take by mouth.     omeprazole (PRILOSEC) 20 MG capsule TAKE ONE CAPSULE BY MOUTH EVERYDAY AT BEDTIME 90 capsule 3   pregabalin (LYRICA) 150 MG capsule Take 300 mg by mouth at bedtime.  rOPINIRole  (REQUIP) 1 MG tablet TAKE 1/2 TABLET BY MOUTH EVERY MORNING and TAKE THREE TABLETS BY MOUTH EVERYDAY AT BEDTIME 315 tablet 5   Semaglutide,0.25 or 0.'5MG'$ /DOS, (OZEMPIC, 0.25 OR 0.5 MG/DOSE,) 2 MG/1.5ML SOPN Inject 0.5 mg into the skin once a week.     sertraline (ZOLOFT) 100 MG tablet TAKE TWO TABLETS BY MOUTH EVERY MORNING 60 tablet 1   traZODone (DESYREL) 50 MG tablet Take 2 tablets (100 mg total) by mouth at bedtime. 180 tablet 0   zinc gluconate 50 MG tablet Take 50 mg by mouth daily.     Current Facility-Administered Medications on File Prior to Visit  Medication Dose Route Frequency Provider Last Rate Last Admin   influenza vaccine adjuvanted (FLUAD) injection 0.5 mL  0.5 mL Intramuscular Once Charlaine Dalton R, MD       sodium chloride flush (NS) 0.9 % injection 10 mL  10 mL Intravenous PRN Cammie Sickle, MD   10 mL at 09/11/15 0845     Review of Systems  Constitutional:  Positive for fatigue. Negative for activity change, appetite change, fever and unexpected weight change.  HENT:  Negative for congestion, ear pain, rhinorrhea, sinus pressure and sore throat.   Eyes:  Negative for pain, redness and visual disturbance.  Respiratory:  Positive for cough. Negative for choking, chest tightness, shortness of breath, wheezing and stridor.   Cardiovascular:  Negative for chest pain, palpitations and leg swelling.  Gastrointestinal:  Negative for abdominal pain, blood in stool, constipation, diarrhea and nausea.       N/v/d is entirely better   Endocrine: Negative for polydipsia and polyuria.  Genitourinary:  Negative for dysuria, frequency and urgency.  Musculoskeletal:  Negative for arthralgias, back pain and myalgias.  Skin:  Negative for pallor and rash.  Allergic/Immunologic: Negative for environmental allergies.  Neurological:  Negative for dizziness, syncope and headaches.  Hematological:  Negative for adenopathy. Does not bruise/bleed easily.  Psychiatric/Behavioral:   Negative for decreased concentration and dysphoric mood. The patient is not nervous/anxious.       Objective:   Physical Exam Constitutional:      General: She is not in acute distress.    Appearance: Normal appearance. She is well-developed. She is obese. She is not ill-appearing or diaphoretic.  HENT:     Head: Normocephalic and atraumatic.     Right Ear: Tympanic membrane, ear canal and external ear normal.     Left Ear: Tympanic membrane, ear canal and external ear normal.     Nose: Nose normal.     Mouth/Throat:     Pharynx: Oropharynx is clear. No oropharyngeal exudate or posterior oropharyngeal erythema.     Comments: Baseline dry mm    No changes from usual Eyes:     General: No scleral icterus.       Right eye: No discharge.        Left eye: No discharge.     Conjunctiva/sclera: Conjunctivae normal.     Pupils: Pupils are equal, round, and reactive to light.  Neck:     Thyroid: No thyromegaly.     Vascular: No carotid bruit or JVD.     Comments: Baseline surgical /radiation changes  Cardiovascular:     Rate and Rhythm: Regular rhythm. Tachycardia present.     Heart sounds: Normal heart sounds.    No gallop.  Pulmonary:     Effort: Pulmonary effort is normal. No respiratory distress.     Breath sounds: Normal breath sounds. No stridor.  No wheezing, rhonchi or rales.     Comments: No changes from baseline  No rhonchi or wheeze No crackles  Chest:     Chest wall: No tenderness.  Abdominal:     General: Bowel sounds are normal. There is no distension or abdominal bruit.     Palpations: Abdomen is soft. There is no mass.     Tenderness: There is no abdominal tenderness. There is no guarding or rebound.  Musculoskeletal:     Cervical back: Normal range of motion and neck supple. No tenderness.     Right lower leg: No edema.     Left lower leg: No edema.  Lymphadenopathy:     Cervical: No cervical adenopathy.  Skin:    General: Skin is warm and dry.      Coloration: Skin is not jaundiced or pale.     Findings: No rash.     Comments: Skin turgor indicates mild dehydration  Cap refill time is normal  Neurological:     Mental Status: She is alert.     Coordination: Coordination normal.     Deep Tendon Reflexes: Reflexes are normal and symmetric. Reflexes normal.  Psychiatric:        Mood and Affect: Mood normal.          Assessment & Plan:   Problem List Items Addressed This Visit       Digestive   Nausea vomiting and diarrhea    This happened last week and is fully resolved Pt has a history of recurrent aspiration pneumonia, reviewed s/s to watch for  (increased cough/weakness) inst to keep wearing her med alert button  May have been viral vs rxn to her semaglutide Signs of dehydration (change in skin turgor and also tachycardia) Otherwise baseline exam  Discussed guidelines for re hydration  Will monitor closely  Update if not starting to improve in a week or if worsening          Other   At high risk for aspiration    Due to radiation in the past   Pt had n/v/d last week  No signs of pna now but need to monitor very closely  She wears a medi alert button

## 2021-04-25 DIAGNOSIS — M1712 Unilateral primary osteoarthritis, left knee: Secondary | ICD-10-CM | POA: Diagnosis not present

## 2021-05-05 ENCOUNTER — Telehealth: Payer: Self-pay

## 2021-05-05 ENCOUNTER — Other Ambulatory Visit: Payer: Self-pay

## 2021-05-05 ENCOUNTER — Ambulatory Visit (INDEPENDENT_AMBULATORY_CARE_PROVIDER_SITE_OTHER): Payer: PPO | Admitting: Psychiatry

## 2021-05-05 ENCOUNTER — Encounter: Payer: Self-pay | Admitting: Psychiatry

## 2021-05-05 DIAGNOSIS — F411 Generalized anxiety disorder: Secondary | ICD-10-CM | POA: Diagnosis not present

## 2021-05-05 DIAGNOSIS — F5101 Primary insomnia: Secondary | ICD-10-CM | POA: Diagnosis not present

## 2021-05-05 MED ORDER — SERTRALINE HCL 100 MG PO TABS: 200.0000 mg | ORAL_TABLET | Freq: Every morning | ORAL | 1 refills | Status: DC

## 2021-05-05 MED ORDER — ALPRAZOLAM 0.5 MG PO TABS: ORAL_TABLET | ORAL | 5 refills | Status: DC

## 2021-05-05 MED ORDER — BELSOMRA 20 MG PO TABS: 20.0000 mg | ORAL_TABLET | Freq: Every evening | ORAL | 5 refills | Status: DC | PRN

## 2021-05-05 MED ORDER — BELSOMRA 20 MG PO TABS: 20.0000 mg | ORAL_TABLET | Freq: Every evening | ORAL | 0 refills | Status: DC | PRN

## 2021-05-05 MED ORDER — BUSPIRONE HCL 15 MG PO TABS: 15.0000 mg | ORAL_TABLET | Freq: Two times a day (BID) | ORAL | 1 refills | Status: DC

## 2021-05-05 NOTE — Progress Notes (Signed)
? ? ?Chronic Care Management ?Pharmacy Assistant  ? ?Name: Suzanne Strong  MRN: [id number] DOB: Jul 14, 1945 ? ?Reason for Encounter: CCM (Medication Adherence and Delivery Coordination) ?  ?Recent office visits:  ?04/23/2021 - [name], MD - Nausea, Vomiting, Diarrhea due to radiation. No med changes. ? ?Recent consult visits:  ?04/25/2021 - Marin Olp, PA - Osteoarthritis of left knee joint. No other information.  ? ?Hospital visits:  ?None since last CCM contact ? ?Medications: ?Outpatient Encounter Medications as of 05/05/2021  ?Medication Sig  ? acetaminophen (TYLENOL) 325 MG tablet Take 2 tablets (650 mg total) by mouth every 6 (six) hours as needed for mild pain (or Fever >/= 101).  ? albuterol (VENTOLIN HFA) 108 (90 Base) MCG/ACT inhaler INHALE TWO PUFFS BY MOUTH EVERY SIX HOURS AS NEEDED  ? [START ON 06/06/2021] ALPRAZolam (XANAX) 0.5 MG tablet TAKE ONE TABLET BY MOUTH twice daily AS NEEDED FOR ANXIETY OR SLEEP  ? amitriptyline (ELAVIL) 50 MG tablet Take 1 tablet (50 mg total) by mouth at bedtime.  ? amLODipine (NORVASC) 5 MG tablet Take 1 tablet (5 mg total) by mouth daily.  ? atorvastatin (LIPITOR) 10 MG tablet TAKE 1 TABLET(10 MG) BY MOUTH DAILY  ? b complex vitamins tablet Take 1 tablet by mouth daily.  ? BIOTIN PO Take by mouth.  ? busPIRone (BUSPAR) 15 MG tablet Take 1 tablet (15 mg total) by mouth 2 (two) times daily.  ? Cholecalciferol (VITAMIN D-1000 MAX ST) 25 MCG (1000 UT) tablet Take 2,000 Units by mouth daily. Takes three 50 mcg tablets, 3 capsules daily  ? Emollient (COLLAGEN EX)   ? ferrous sulfate 325 (65 FE) MG tablet Take 1 tablet (325 mg total) by mouth 2 (two) times daily with a meal.  ? glipiZIDE (GLUCOTROL) 5 MG tablet Take 5 mg by mouth daily before breakfast.  ? glucose blood (ONETOUCH ULTRA) test strip USE TO check blood glucose ONCE DAILY AND AS NEEDED FOR DIABETES  ? Multiple Vitamins-Minerals (WOMENS MULTIVITAMIN PO) Take by mouth.  ? omeprazole (PRILOSEC) 20 MG capsule TAKE  ONE CAPSULE BY MOUTH EVERYDAY AT BEDTIME  ? pregabalin (LYRICA) 150 MG capsule Take 300 mg by mouth at bedtime.  ? rOPINIRole (REQUIP) 1 MG tablet TAKE 1/2 TABLET BY MOUTH EVERY MORNING and TAKE THREE TABLETS BY MOUTH EVERYDAY AT BEDTIME  ? Semaglutide,0.25 or 0.'5MG'$ /DOS, (OZEMPIC, 0.25 OR 0.5 MG/DOSE,) 2 MG/1.5ML SOPN Inject 0.5 mg into the skin once a week.  ? sertraline (ZOLOFT) 100 MG tablet Take 2 tablets (200 mg total) by mouth every morning.  ? Suvorexant (BELSOMRA) 20 MG TABS Take 20 mg by mouth at bedtime as needed.  ? traZODone (DESYREL) 50 MG tablet Take 2 tablets (100 mg total) by mouth at bedtime.  ? zinc gluconate 50 MG tablet Take 50 mg by mouth daily.  ? ?Facility-Administered Encounter Medications as of 05/05/2021  ?Medication  ? influenza vaccine adjuvanted (FLUAD) injection 0.5 mL  ? sodium chloride flush (NS) 0.9 % injection 10 mL  ? ?BP Readings from Last 3 Encounters:  ?04/23/21 118/68  ?03/25/21 (!) 156/82  ?12/11/20 109/73  ?  ?Lab Results  ?Component Value Date  ? HGBA1C 6.5 (H) 12/11/2020  ?  ?Recent OV, Consult or Hospital visit:  ?No medication changes indicated ? ?Last adherence delivery date: 04/15/2021     ? ?Patient is due for next adherence delivery on: 05/14/2021 ? ?Spoke with patient on 05/05/2021 reviewed medications and coordinated delivery. ? ?This delivery to include: Adherence Packaging  30 Days  ?Packs: ?Pregabalin '150mg'$ - 2 daily (2 bedtime) ?Omeprazole '20mg'$ - 1 tablet daily (bedtime)  ?Ropinirole '1mg'$  - ? tablet (breakfast) and 3 tablets nightly (bedtime)  ?Atorvastatin '10mg'$ - 1 tablet daily (breakfast)  ?Amlodipine '5mg'$ - 1 tablet daily (breakfast)   ?Amitriptyline '50mg'$ - 1 tablet daily (bedtime)  ?  ?VIAL medications: ?No vial medications this delivery ?  ?Patient declined the following medications this month: ?Onetouch ultra test strips- patient has supply on hand ?Sertraline '100mg'$ - 2 tablets daily (2-breakfast) - Got from [facility] ?Ozempic 0.'5mg'$  - inject once weekly (fridays) -  Got from [place] ?Alprazolam 0.'5mg'$  take 1 tablet 2 times daily as needed for anxiety - Got from [facility] ?Albuterol Inhaler- 2 puffs every 6 hours as needed ? ?Any concerns about your medications? No ? ?How often do you forget or accidentally miss a dose? Never ? ?Do you use a pillbox? No ? ?Is patient in packaging Yes ? If yes ? What is the date on your next pill pack? 05/05/2021 bedtime ? Any concerns or issues with your packaging? No ? ?No refill request needed. ? ?Confirmed delivery date of 05/14/2021, advised patient that pharmacy will contact them the morning of delivery. ? ?Recent blood pressure readings are as follows: Patient does not take her blood pressure regularly. ? ?Recent blood glucose readings are as follows: 03/20 = 108 fasting. Patient stated the highest it has been is 153 and that was only one day.  ? ?Annual wellness visit in last year? Yes 11/09/2020 ?Most Recent BP reading: 118/68 on 04/23/2021 ? ?If Diabetic: ?Most recent A1C reading: 6.5 on 12/11/2020 ?Last eye exam / retinopathy screening: 05/02/2020 ?Last diabetic foot exam: 07/05/2017 ? ?Charlene Brooke, CPP notified ? ?Marijean Niemann, RMA ?Clinical Pharmacy Assistant ?[phone] ? ? ? ?

## 2021-05-05 NOTE — Chronic Care Management (AMB) (Signed)
? ? ?  Chronic Care Management ?Pharmacy Assistant  ? ? ? ? ? ?Entered in Error  ? ?  ? ?

## 2021-05-05 NOTE — Progress Notes (Signed)
Suzanne Strong ?[id number] ?[birth year] ?76 y.o. ? ?Subjective:  ? ?Patient ID:  Suzanne Strong is a 76 y.o. (DOB 11-07-1945) female. ? ?Chief Complaint:  ?Chief Complaint  ?Patient presents with  ? Insomnia  ? Follow-up  ?  Depression, anxiety  ? ? ?Insomnia ? ?[name] presents to the office today for follow-up of depression, anxiety, and insomnia. She reports that she has intermittent insomnia. She reports that she has difficulty falling asleep and then awakens every 2-3 hours to use the bathroom. She reports that Trazodone was effective for awhile and then she started having difficulty with sleep almost nightly. Estimates sleeping about 6 hours total. She reports that she stays up until 11- midnight so she will be tired. She reports taking sleep medication 30-45 minutes prior to bedtime. She has RLS that are more severe if she does not take Ropinirole. She reports that she takes naps on Saturdays and Sundays when she is not working.  ? ?She reports that she continues to adjust to the loss of husband of 47 years. She reports, "he and I did everything together." She denies depressed mood. She reports that she has never liked to drive and husband would drive her to appointments. She denies severe anxiety or anxiety with driving. Appetite has been good. She reports some weight loss after switching to Ozempic. She reports that her energy and motivation have been ok. Concentration has been good. Denies SI.  ? ?She reports taking Alprazolam prn usually once a day and twice on occasion.  ? ?Past Psychiatric Medication Trials: ?Remeron ?Xanax ?Gabapentin- Unsure if this is helpful for RLS ?Lyrica ?Ropinorole- Helpful for RLS ?Zoloft ?Buspar ?Lyrica ?Trazodone-Ineffective ?Amitriptyline ?Belsomra-Effective.  ?  ? ?Mini-Mental   ? ?Flowsheet Row Clinical Support from 07/15/2018 in [place] [facility] [place] from 06/29/2017 in [place] at [place] from  06/25/2016 in [place] at [place]  ?Total Score (max 30 points ) '17 20 20  '$ ? ?  ? ?PHQ2-9   ? ?Flowsheet Row Clinical Support from 11/09/2020 in [place] at [place] from 07/15/2018 in [place] at [place] from 06/29/2017 in [place] at [place] from 06/25/2016 in [place] at [facility] Follow Up  from 02/24/2016 in [place]  ?PHQ-2 Total Score 0 0 0 0 0  ?PHQ-9 Total Score -- 0 0 -- --  ? ?  ? ?Flowsheet Row ED to Hosp-Admission (Discharged) from 12/09/2020 in [place] ED to Hosp-Admission (Discharged) from 05/28/2020 in [place] Admission (Discharged) from 05/24/2020 in [place]  ?C-SSRS RISK CATEGORY No Risk No Risk No Risk  ? ?  ?  ? ?Review of Systems:  ?Review of Systems  ?Musculoskeletal:  Negative for gait problem.  ?Neurological:   ?     Occ difficulty with balance  ?Psychiatric/Behavioral:  The patient has insomnia.   ?     Please refer to HPI  ? ?Medications: I have reviewed the patient's current medications. ? ?Current Outpatient Medications  ?Medication Sig Dispense Refill  ? acetaminophen (TYLENOL) 325 MG tablet Take 2 tablets (650 mg total) by mouth every 6 (six) hours as needed for mild pain (or Fever >/= 101).    ? albuterol (VENTOLIN HFA) 108 (90 Base) MCG/ACT inhaler INHALE TWO PUFFS BY MOUTH EVERY SIX HOURS AS NEEDED 8.5 g  3  ? amLODipine (NORVASC) 5 MG tablet Take 1 tablet (5 mg total) by mouth daily. 90 tablet 3  ? atorvastatin (LIPITOR) 10 MG tablet TAKE 1 TABLET(10 MG) BY MOUTH DAILY 90 tablet 3  ? b complex vitamins tablet Take 1 tablet by mouth daily.    ? BIOTIN PO Take by mouth.    ? Cholecalciferol (VITAMIN D-1000 MAX ST) 25 MCG (1000 UT) tablet Take 2,000 Units by mouth daily. Takes three 50 mcg tablets, 3 capsules  daily    ? Emollient (COLLAGEN EX)     ? ferrous sulfate 325 (65 FE) MG tablet Take 1 tablet (325 mg total) by mouth 2 (two) times daily with a meal.    ? glipiZIDE (GLUCOTROL) 5 MG tablet Take 5 mg by mouth daily before breakfast.    ? Multiple Vitamins-Minerals (WOMENS MULTIVITAMIN PO) Take by mouth.    ? omeprazole (PRILOSEC) 20 MG capsule TAKE ONE CAPSULE BY MOUTH EVERYDAY AT BEDTIME 90 capsule 3  ? pregabalin (LYRICA) 150 MG capsule Take 300 mg by mouth at bedtime.    ? rOPINIRole (REQUIP) 1 MG tablet TAKE 1/2 TABLET BY MOUTH EVERY MORNING and TAKE THREE TABLETS BY MOUTH EVERYDAY AT BEDTIME 315 tablet 5  ? Semaglutide,0.25 or 0.'5MG'$ /DOS, (OZEMPIC, 0.25 OR 0.5 MG/DOSE,) 2 MG/1.5ML SOPN Inject 0.5 mg into the skin once a week.    ? traZODone (DESYREL) 50 MG tablet Take 2 tablets (100 mg total) by mouth at bedtime. 180 tablet 0  ? zinc gluconate 50 MG tablet Take 50 mg by mouth daily.    ? [START ON 06/06/2021] ALPRAZolam (XANAX) 0.5 MG tablet TAKE ONE TABLET BY MOUTH twice daily AS NEEDED FOR ANXIETY OR SLEEP 60 tablet 5  ? amitriptyline (ELAVIL) 50 MG tablet Take 1 tablet (50 mg total) by mouth at bedtime. 90 tablet 3  ? busPIRone (BUSPAR) 15 MG tablet Take 1 tablet (15 mg total) by mouth 2 (two) times daily. 180 tablet 1  ? glucose blood (ONETOUCH ULTRA) test strip USE TO check blood glucose ONCE DAILY AND AS NEEDED FOR DIABETES 100 strip 1  ? sertraline (ZOLOFT) 100 MG tablet Take 2 tablets (200 mg total) by mouth every morning. 180 tablet 1  ? Suvorexant (BELSOMRA) 20 MG TABS Take 20 mg by mouth at bedtime as needed. 30 tablet 5  ? ?No current facility-administered medications for this visit.  ? ?Facility-Administered Medications Ordered in Other Visits  ?Medication Dose Route Frequency Provider Last Rate Last Admin  ? influenza vaccine adjuvanted (FLUAD) injection 0.5 mL  0.5 mL Intramuscular Once Charlaine Dalton R, MD      ? sodium chloride flush (NS) 0.9 % injection 10 mL  10 mL Intravenous PRN  Cammie Sickle, MD   10 mL at 09/11/15 0845  ? ? ?Medication Side Effects: None ? ?Allergies:  ?Allergies  ?Allergen Reactions  ? Amoxicillin Swelling  ?  REACTION: rash, swelling ?Has patient had a PCN reaction causing immediate rash, facial/tongue/throat swelling, SOB or lightheadedness with hypotension: yes ?Has patient had a PCN reaction causing severe rash involving mucus membranes or skin necrosis: no ?Has patient had a PCN reaction that required hospitalization: no ?Has patient had a PCN reaction occurring within the last 10 years: yes ?If all of the above answers are "NO", then may proceed with Cephalosporin use. ? ?Has tolerated ceftriaxone  ? Penicillin G Anaphylaxis  ?  Has tolerated ceftriaxone on multiple occasions   ? Fluconazole Rash  ?  REACTION: hives  ?  Difuleron [Ferrous Fumarate-Dss]   ? Other Other (See Comments)  ?  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 ? ?Past Medical History:  ?Diagnosis Date  ? Anemia   ? iron deficiency and B12 deficiency  ? Anxiety   ? Breast cancer ([place]) 02/25/2015  ? upper-outer quadrant of left female breast, Triple Negative. Neo-adjuvant chemo with complete pathologic response.   ? Cervical dysplasia   ? conization  ? Diabetes mellitus without complication ([place])   ? Diverticulosis   ? Fever 04/11/2015  ? Gastroparesis   ? related to previous radiation tx  ? GERD (gastroesophageal reflux disease)   ? Hyperlipidemia   ? Hypertension   ? Neuropathy   ? legs/feet, S/P chemo meds  ? Personal history of chemotherapy   ? Personal history of radiation therapy 2017  ? LEFT lumpectomy w/ radiation  ? RLS (restless legs syndrome)   ? Shingles   ? Hx of  ? 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 Wears dentures   ? partial bottom  ? ? ?Past Medical History, Surgical history, Social history, and Family history were reviewed and updated as appropriate.  ? ?Please see review of systems for further details on the patient's review from today.   ? ?Objective:  ? ?Physical Exam:  ?There were no vitals taken for this visit. ? ?Physical Exam ?Constitutional:   ?   General: She is not in acute distress. ?Musculoskeletal:     ?   General: No deformity.  ?Neurologica

## 2021-05-14 ENCOUNTER — Ambulatory Visit
Admission: RE | Admit: 2021-05-14 | Discharge: 2021-05-14 | Disposition: A | Discharge status: Home / Self Care | Payer: PPO | Source: Ambulatory Visit | Attending: General Surgery | Admitting: General Surgery

## 2021-05-14 DIAGNOSIS — Z171 Estrogen receptor negative status [ER-]: Secondary | ICD-10-CM | POA: Diagnosis not present

## 2021-05-14 DIAGNOSIS — C50412 Malignant neoplasm of upper-outer quadrant of left female breast: Secondary | ICD-10-CM | POA: Insufficient documentation

## 2021-05-14 DIAGNOSIS — Z1231 Encounter for screening mammogram for malignant neoplasm of breast: Secondary | ICD-10-CM | POA: Insufficient documentation

## 2021-05-16 DIAGNOSIS — E119 Type 2 diabetes mellitus without complications: Secondary | ICD-10-CM | POA: Diagnosis not present

## 2021-05-16 LAB — HM DIABETES EYE EXAM

## 2021-05-27 ENCOUNTER — Inpatient Hospital Stay: Payer: PPO

## 2021-05-27 ENCOUNTER — Inpatient Hospital Stay: Payer: PPO | Attending: Internal Medicine

## 2021-05-27 ENCOUNTER — Encounter: Payer: Self-pay | Admitting: Internal Medicine

## 2021-05-27 ENCOUNTER — Inpatient Hospital Stay: Payer: PPO | Admitting: Internal Medicine

## 2021-05-27 VITALS — BP 142/82 | HR 77 | Temp 96.3°F | Ht 66.0 in | Wt 206.2 lb

## 2021-05-27 DIAGNOSIS — C50412 Malignant neoplasm of upper-outer quadrant of left female breast: Secondary | ICD-10-CM | POA: Insufficient documentation

## 2021-05-27 DIAGNOSIS — D649 Anemia, unspecified: Secondary | ICD-10-CM | POA: Diagnosis not present

## 2021-05-27 DIAGNOSIS — Z801 Family history of malignant neoplasm of trachea, bronchus and lung: Secondary | ICD-10-CM | POA: Insufficient documentation

## 2021-05-27 DIAGNOSIS — E1143 Type 2 diabetes mellitus with diabetic autonomic (poly)neuropathy: Secondary | ICD-10-CM | POA: Diagnosis not present

## 2021-05-27 DIAGNOSIS — M8580 Other specified disorders of bone density and structure, unspecified site: Secondary | ICD-10-CM | POA: Insufficient documentation

## 2021-05-27 DIAGNOSIS — I10 Essential (primary) hypertension: Secondary | ICD-10-CM | POA: Diagnosis not present

## 2021-05-27 DIAGNOSIS — Z87891 Personal history of nicotine dependence: Secondary | ICD-10-CM | POA: Insufficient documentation

## 2021-05-27 DIAGNOSIS — Z171 Estrogen receptor negative status [ER-]: Secondary | ICD-10-CM | POA: Diagnosis not present

## 2021-05-27 LAB — CBC WITH DIFFERENTIAL/PLATELET
Abs Immature Granulocytes: 0.01 10*3/uL (ref 0.00–0.07)
Basophils Absolute: 0 10*3/uL (ref 0.0–0.1)
Basophils Relative: 1 %
Eosinophils Absolute: 0.1 10*3/uL (ref 0.0–0.5)
Eosinophils Relative: 2 %
HCT: 36.6 % (ref 36.0–46.0)
Hemoglobin: 11.3 g/dL — ABNORMAL LOW (ref 12.0–15.0)
Immature Granulocytes: 0 %
Lymphocytes Relative: 20 %
Lymphs Abs: 1.2 10*3/uL (ref 0.7–4.0)
MCH: 27.4 pg (ref 26.0–34.0)
MCHC: 30.9 g/dL (ref 30.0–36.0)
MCV: 88.8 fL (ref 80.0–100.0)
Monocytes Absolute: 0.3 10*3/uL (ref 0.1–1.0)
Monocytes Relative: 6 %
Neutro Abs: 4.1 10*3/uL (ref 1.7–7.7)
Neutrophils Relative %: 71 %
Platelets: 208 10*3/uL (ref 150–400)
RBC: 4.12 MIL/uL (ref 3.87–5.11)
RDW: 15.7 % — ABNORMAL HIGH (ref 11.5–15.5)
WBC: 5.7 10*3/uL (ref 4.0–10.5)
nRBC: 0 % (ref 0.0–0.2)

## 2021-05-27 LAB — COMPREHENSIVE METABOLIC PANEL
ALT: 14 U/L (ref 0–44)
AST: 18 U/L (ref 15–41)
Albumin: 3.6 g/dL (ref 3.5–5.0)
Alkaline Phosphatase: 84 U/L (ref 38–126)
Anion gap: 10 (ref 5–15)
BUN: 15 mg/dL (ref 8–23)
CO2: 28 mmol/L (ref 22–32)
Calcium: 8.7 mg/dL — ABNORMAL LOW (ref 8.9–10.3)
Chloride: 101 mmol/L (ref 98–111)
Creatinine, Ser: 0.74 mg/dL (ref 0.44–1.00)
GFR, Estimated: >60 mL/min (ref >60)
Glucose, Bld: 102 mg/dL — ABNORMAL HIGH (ref 70–99)
Potassium: 3.9 mmol/L (ref 3.5–5.1)
Sodium: 139 mmol/L (ref 135–145)
Total Bilirubin: 0.3 mg/dL (ref 0.3–1.2)
Total Protein: 7.7 g/dL (ref 6.5–8.1)

## 2021-05-27 NOTE — Assessment & Plan Note (Addendum)
#   Left breast cancer [2017] stage III  TRIPLE NEGATIVE-  s/p neoadjuvant chemotherapy-currently under surveillance. SEP 2022- [Dr>Byrnett] BIL mammogram March 2023. STABLE.  ? ?# Continue close surveillance; with out evidence of recurrence; will switch over to annual surveillance starting next visit.   ? ?# Mild anemia -hemoglobin 10-11 for the last 10 years; stable.  Discussed the etiology is unclear however since his chronic asymptomatic-s/p EGD and colonoscopy 2022.  STABLE.  ? 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 ?# OSTEOPENIA- AUG 2021- T score -1.8; HOLD reclast- sec to calcium- 8.7.; on Vit D; recommend 500 mg BID; hold Reclast today because of upcoming possible dental extractions..   ? ?# DISPOSITION: ?# cancel reclast on 4/13 ?# follow up in 6 m with MD/labs-cbc/cmp/ca-27-29; reclast- -Dr.B ?

## 2021-05-27 NOTE — Progress Notes (Signed)
[facility] ?OFFICE PROGRESS NOTE ? ?Patient Care Team: ?Tower, [name], MD as PCP - General ?Bary Castilla [name], MD (General Surgery) ?[name], MD (Oncology) ?Alda Berthold, DO as Consulting Physician (Neurology) ?Cammie Sickle, MD as Consulting Physician (Oncology) ?[name], [facility] (Pharmacist) ?Noreene Filbert, MD as Radiation Oncologist (Radiation Oncology) ? ? Cancer Staging  ?No matching staging information was found for the patient. ? ? 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 ?# .jan 2017- ABNORMAL MAMMOGRAM OF THE LEFT BREAST.  5 CM TUMOR MASS BIOPSIES POSITIVE FOR INVASIVE MAMMARY CARCINOMA TRIPLE NEGATIVE DISEASE(jANUARY, 2017)stage IIIa. ER/PR/her2 Neu-NEGATIVE ? ?# Cytoxan and Adriamycin from March 18, 2015; carbo-taxol x4 cycles ? ?# AUg 18th s/p Lumpec & SLNBx- Complete path CR [ypT0 ypsn0] s/p RT [oct 2017; Dr.Crystal] ? ?3.  MUGA scan of the heart shows ejection fraction to be 61% (January, 2017)  ? ?EGD/colonoscopy- [April 7262]- ? ?------------------------------------------------------------   ? ?DIAGNOSIS: BREAST CANCER ? ?STAGE:  III       ;GOALS: curative ? ?CURRENT/MOST RECENT THERAPY: surveillaince.  ? ?  ?Carcinoma of upper-outer quadrant of left breast in female, estrogen receptor negative ([place])  ?11/21/2015 Initial Diagnosis  ? Carcinoma of upper-outer quadrant of left breast in female, estrogen receptor negative ([place]) ?  ? ? ?INTERVAL HISTORY: Ambulating independently.  Alone. ? ?Suzanne Strong 76 y.o.  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 ? ?Patient denies any new lumps or bumps.  Appetite is good.  No weight loss.  No headaches.  Chronic tingling and numbness in extremities. She  continues to be on Lyrica/ Amitriptyline. ? ? ?Review of Systems  ?Constitutional:  Negative for chills, diaphoresis, fever, malaise/fatigue and weight loss.  ?HENT:  Negative for nosebleeds and sore throat.   ?Eyes:  Negative for double vision.  ?Respiratory:  Negative for hemoptysis, shortness of breath and wheezing.   ?Cardiovascular:  Negative for chest pain, palpitations, orthopnea and leg swelling.  ?Gastrointestinal:  Negative for abdominal pain, blood in stool, constipation, diarrhea, heartburn, melena, nausea and vomiting.  ?Genitourinary:  Negative for dysuria, frequency and urgency.  ?Musculoskeletal:  Negative for back pain and joint pain.  ?Skin: Negative.  Negative for itching and rash.  ?Neurological:  Positive for tingling. Negative for focal weakness, weakness and headaches.  ?Endo/Heme/Allergies:  Does not bruise/bleed easily.  ?Psychiatric/Behavioral:  Negative for depression. The patient is not nervous/anxious and does not have insomnia.   ?  ? ?PAST MEDICAL HISTORY :  ?Past Medical History:  ?Diagnosis Date  ? Anemia   ? iron deficiency and B12 deficiency  ? Anxiety   ? Breast cancer ([place]) 02/25/2015  ? upper-outer quadrant of left female breast, Triple Negative. Neo-adjuvant chemo with complete pathologic response.   ? Cervical dysplasia   ? conization  ? Diabetes mellitus without complication ([place])   ? Diverticulosis   ? Fever 04/11/2015  ? Gastroparesis   ? related to previous radiation tx  ? GERD (gastroesophageal reflux disease)   ? Hyperlipidemia   ? Hypertension   ? Neuropathy   ? legs/feet, S/P chemo meds  ? Personal history of chemotherapy   ? Personal history of radiation therapy 2017  ? LEFT lumpectomy w/ radiation  ? RLS (restless legs syndrome)   ? Shingles   ?  Hx of  ? 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 Wears dentures   ? partial bottom  ? ? ?PAST SURGICAL HISTORY :   ?Past Surgical History:  ?Procedure Laterality Date  ? APPENDECTOMY    ? BREAST BIOPSY Left 02/25/2015  ? INVASIVE  MAMMARY CARCINOMA,Triple negative.  ? BREAST CYST ASPIRATION    ? BREAST EXCISIONAL BIOPSY Left 12/2015  ? surgery  ? BREAST LUMPECTOMY WITH NEEDLE LOCALIZATION Left 10/02/2015  ? Procedure: BREAST LUMPECTOMY WITH NEEDLE LOCALIZATION;  Surgeon: [name], MD;  Location: [facility];  Service: General;  Laterality: Left;  ? BREAST LUMPECTOMY WITH SENTINEL LYMPH NODE BIOPSY Left 10/02/2015  ? Procedure: LEFT BREAST WIDE EXCISION WITH SENTINEL LYMPH NODE BX;  Surgeon: [name], MD;  Location: [facility];  Service: General;  Laterality: Left;  ? BREAST SURGERY    ? breast biopsy benign  ? CARPAL TUNNEL RELEASE Right 05/19/2017  ? Procedure: CARPAL TUNNEL RELEASE;  Surgeon: Earnestine Leys, MD;  Location: [facility];  Service: Orthopedics;  Laterality: Right;  ? CATARACT EXTRACTION W/PHACO Right 10/26/2018  ? Procedure: CATARACT EXTRACTION PHACO AND INTRAOCULAR LENS PLACEMENT ([place]) right diabetic;  Surgeon: Leandrew Koyanagi, MD;  Location: [facility];  Service: Ophthalmology;  Laterality: Right;  Diabetic - oral meds ?cancer center been notified and will come  ? CATARACT EXTRACTION W/PHACO Left 11/16/2018  ? Procedure: CATARACT EXTRACTION PHACO AND INTRAOCULAR LENS PLACEMENT ([facility]) LEFT DIABETIC  01:01.0  17.0%  10.37;  Surgeon: Leandrew Koyanagi, MD;  Location: [facility];  Service: Ophthalmology;  Laterality: Left;  Diabetic - oral meds ?Port-a-cath  ? CHOLECYSTECTOMY    ? Cologuard  07/22/2016  ? Negative  ? COLONOSCOPY WITH PROPOFOL N/A 05/24/2020  ? Procedure: COLONOSCOPY WITH PROPOFOL;  Surgeon: Virgel Manifold, MD;  Location: [facility];  Service: Endoscopy;  Laterality: N/A;  ? ESOPHAGOGASTRODUODENOSCOPY  07/2009  ? normal with few gastric polyps  ? ESOPHAGOGASTRODUODENOSCOPY (EGD) WITH PROPOFOL N/A 05/24/2020  ? Procedure: ESOPHAGOGASTRODUODENOSCOPY (EGD) WITH PROPOFOL;  Surgeon: Virgel Manifold, MD;  Location: [facility];  Service: Endoscopy;  Laterality: N/A;  ? MOLE  REMOVAL    ? PORT-A-CATH REMOVAL    ? PORTACATH PLACEMENT    ? PORTACATH PLACEMENT Right 03/12/2015  ? Procedure: INSERTION PORT-A-CATH;  Surgeon: [name], MD;  Location: [facility];  Service: General;  Laterality: Right;  ? RADICAL NECK DISSECTION    ? TONSILLECTOMY    ? cancer treated with chemo and radiation  ? TRIGGER FINGER RELEASE Right 05/19/2017  ? Procedure: RELEASE TRIGGER FINGER/A-1 PULLEY ring finger;  Surgeon: Earnestine Leys, MD;  Location: [facility];  Service: Orthopedics;  Laterality: Right;  ? ? ?FAMILY HISTORY :   ?Family History  ?Problem Relation Age of Onset  ? Osteoporosis Mother   ? Hyperlipidemia Mother   ? Depression Mother   ? Cancer Mother   ?     skin cancer ? basal cell and lung ca heavy smoker  ? Alcohol abuse Father   ? Cancer Father   ?     skin CA ? basal cell  ? Heart disease Father   ?     CHF  ? Hyperlipidemia Brother   ? Hypertension Brother   ? Breast cancer Neg Hx   ? ? ?SOCIAL HISTORY:   ?Social History  ? ?Tobacco Use  ? Smoking status: Former  ?  Packs/day: 0.50  ?  Years: 6.00  ?  Pack years: 3.00  ?  Types: Cigarettes  ?  Quit date: 03/10/1984  ?  Years since quitting: 37.2  ? Smokeless tobacco: Never  ?Vaping Use  ? Vaping Use: Never used  ?Substance Use Topics  ? Alcohol use: No  ?  Alcohol/week: 0.0 standard drinks  ? Drug use: No  ? ? ?ALLERGIES:  is allergic to amoxicillin, penicillin g, fluconazole, difuleron [ferrous fumarate-dss], and other. ? ?MEDICATIONS:  ?Current Outpatient Medications  ?Medication Sig Dispense Refill  ? acetaminophen (TYLENOL) 325 MG tablet Take 2 tablets (650 mg total) by mouth every 6 (six) hours as needed for mild pain (or Fever >/= 101).    ? albuterol (VENTOLIN HFA) 108 (90 Base) MCG/ACT inhaler INHALE TWO PUFFS BY MOUTH EVERY SIX HOURS AS NEEDED 8.5 g 3  ? [START ON 06/06/2021] ALPRAZolam (XANAX) 0.5 MG tablet TAKE ONE TABLET BY MOUTH twice daily AS NEEDED FOR ANXIETY OR SLEEP 60 tablet 5  ? amitriptyline (ELAVIL) 50 MG tablet Take 1  tablet (50 mg total) by mouth at bedtime. 90 tablet 3  ? amLODipine (NORVASC) 5 MG tablet Take 1 tablet (5 mg total) by mouth daily. 90 tablet 3  ? atorvastatin (LIPITOR) 10 MG tablet TAKE 1 TABLET(10 MG) BY MOUT

## 2021-05-28 LAB — CANCER ANTIGEN 27.29: CA 27.29: <9 U/mL (ref 0.0–38.6)

## 2021-05-29 ENCOUNTER — Ambulatory Visit: Payer: PPO

## 2021-06-02 ENCOUNTER — Telehealth: Payer: Self-pay

## 2021-06-02 NOTE — Chronic Care Management (AMB) (Addendum)
? ? ?Chronic Care Management ?Pharmacy Assistant  ? ?Name: Suzanne Strong  MRN: [id number] DOB: Oct 30, 1945 ? ? ?Reason for Encounter: Medication Adherence and Delivery Coordination ? ? ?Recent office visits:  ?None since last CCM contact ? ?Recent consult visits:  ?05/27/21-Oncology-Govinda Brahmanday,MD-Breast cancer follow up-no medication changes follow up 6 months. ? ?Hospital visits:  ?None in previous 6 months ? ?Medications: ?Outpatient Encounter Medications as of 06/02/2021  ?Medication Sig  ? acetaminophen (TYLENOL) 325 MG tablet Take 2 tablets (650 mg total) by mouth every 6 (six) hours as needed for mild pain (or Fever >/= 101).  ? albuterol (VENTOLIN HFA) 108 (90 Base) MCG/ACT inhaler INHALE TWO PUFFS BY MOUTH EVERY SIX HOURS AS NEEDED  ? [START ON 06/06/2021] ALPRAZolam (XANAX) 0.5 MG tablet TAKE ONE TABLET BY MOUTH twice daily AS NEEDED FOR ANXIETY OR SLEEP  ? amitriptyline (ELAVIL) 50 MG tablet Take 1 tablet (50 mg total) by mouth at bedtime.  ? amLODipine (NORVASC) 5 MG tablet Take 1 tablet (5 mg total) by mouth daily.  ? atorvastatin (LIPITOR) 10 MG tablet TAKE 1 TABLET(10 MG) BY MOUTH DAILY  ? b complex vitamins tablet Take 1 tablet by mouth daily.  ? BIOTIN PO Take by mouth.  ? busPIRone (BUSPAR) 15 MG tablet Take 1 tablet (15 mg total) by mouth 2 (two) times daily.  ? Cholecalciferol (VITAMIN D-1000 MAX ST) 25 MCG (1000 UT) tablet Take 2,000 Units by mouth daily. Takes three 50 mcg tablets, 3 capsules daily  ? Emollient (COLLAGEN EX)   ? ferrous sulfate 325 (65 FE) MG tablet Take 1 tablet (325 mg total) by mouth 2 (two) times daily with a meal.  ? glipiZIDE (GLUCOTROL) 5 MG tablet Take 5 mg by mouth daily before breakfast.  ? glucose blood (ONETOUCH ULTRA) test strip USE TO check blood glucose ONCE DAILY AND AS NEEDED FOR DIABETES  ? Multiple Vitamins-Minerals (WOMENS MULTIVITAMIN PO) Take by mouth.  ? omeprazole (PRILOSEC) 20 MG capsule TAKE ONE CAPSULE BY MOUTH EVERYDAY AT BEDTIME  ? pregabalin  (LYRICA) 150 MG capsule Take 300 mg by mouth at bedtime.  ? rOPINIRole (REQUIP) 1 MG tablet TAKE 1/2 TABLET BY MOUTH EVERY MORNING and TAKE THREE TABLETS BY MOUTH EVERYDAY AT BEDTIME  ? Semaglutide,0.25 or 0.'5MG'$ /DOS, (OZEMPIC, 0.25 OR 0.5 MG/DOSE,) 2 MG/1.5ML SOPN Inject 0.5 mg into the skin once a week.  ? sertraline (ZOLOFT) 100 MG tablet Take 2 tablets (200 mg total) by mouth every morning.  ? Suvorexant (BELSOMRA) 20 MG TABS Take 20 mg by mouth at bedtime as needed.  ? zinc gluconate 50 MG tablet Take 50 mg by mouth daily.  ? ?Facility-Administered Encounter Medications as of 06/02/2021  ?Medication  ? influenza vaccine adjuvanted (FLUAD) injection 0.5 mL  ? sodium chloride flush (NS) 0.9 % injection 10 mL  ? ? ?BP Readings from Last 3 Encounters:  ?05/27/21 (!) 142/82  ?04/23/21 118/68  ?03/25/21 (!) 156/82  ?  ?Lab Results  ?Component Value Date  ? HGBA1C 6.5 (H) 12/11/2020  ?  ? ? ?Recent OV, Consult or Hospital visit:  Oncology ?No medication changes indicated ? ? ?Last adherence delivery date:05/14/21     ? ?Patient is due for next adherence delivery on: 06/12/21 ? ?Multiple attempts made to reach patient. Unsuccessful outreach. Will refill based off of last adherence fill.  ? ?This delivery to include: Adherence Packaging  30 Days  ?Packs: ?Pregabalin '150mg'$ - 2 daily (2 bedtime) ?Omeprazole '20mg'$ - 1 tablet daily (bedtime)  ?Ropinirole '1mg'$  - ?  tablet (breakfast) and 3 tablets nightly (bedtime)  ?Atorvastatin '10mg'$ - 1 tablet daily (breakfast)  ?Amlodipine '5mg'$ - 1 tablet daily (breakfast)   ?Amitriptyline '50mg'$ - 1 tablet daily (bedtime)  ? ?VIAL medications: ?N/a ? ? ?Patient declined the following medications this month: n/a ? ?Annual wellness visit in last year? Yes ?Most Recent BP reading:142/82  77-P  05/27/21 ? ?Charlene Brooke, CPP notified ? ?Amabel Stmarie, CCMA ?Health concierge  ?[phone]  ?

## 2021-06-10 DIAGNOSIS — Z853 Personal history of malignant neoplasm of breast: Secondary | ICD-10-CM | POA: Diagnosis not present

## 2021-06-11 ENCOUNTER — Telehealth: Payer: Self-pay

## 2021-06-11 NOTE — Progress Notes (Signed)
? ? ?  Chronic Care Management ?Pharmacy Assistant  ? ?Name: Suzanne Strong  MRN: [id number] DOB: 1945-10-16 ? ?Reason for Encounter: CCM (Glipizide Refill) ?  ?Glipizide 5 mg tablet refill has been requested from Dr. Gabriel Carina.  ? ?Charlene Brooke, CPP notified ? ?Marijean Niemann, RMA ?Clinical Pharmacy Assistant ?[phone] ? ? ?

## 2021-06-16 ENCOUNTER — Telehealth: Payer: Self-pay

## 2021-06-16 NOTE — Chronic Care Management (AMB) (Signed)
? ? ?Chronic Care Management ?Pharmacy Assistant  ? ?Name: JESSIE COWHER  MRN: [id number] DOB: 1945/09/01 ? ?Reason for Encounter: Hypertension Disease State ?  ?Recent office visits:  ?04/23/21-Marne Tower,MD(PCP)- Acute nausea and vomiting,diarrhea.Resolved-monitor close,no medication changes ? ?Recent consult visits:  ?06/10/21-Jeffrey Byrnett,MD(Gen Surg)- F/U mammogram, no medication changes  ? ?Hospital visits:  ?None in previous 6 months ? ?Medications: ?Outpatient Encounter Medications as of 06/16/2021  ?Medication Sig  ? acetaminophen (TYLENOL) 325 MG tablet Take 2 tablets (650 mg total) by mouth every 6 (six) hours as needed for mild pain (or Fever >/= 101).  ? albuterol (VENTOLIN HFA) 108 (90 Base) MCG/ACT inhaler INHALE TWO PUFFS BY MOUTH EVERY SIX HOURS AS NEEDED  ? ALPRAZolam (XANAX) 0.5 MG tablet TAKE ONE TABLET BY MOUTH twice daily AS NEEDED FOR ANXIETY OR SLEEP  ? amitriptyline (ELAVIL) 50 MG tablet Take 1 tablet (50 mg total) by mouth at bedtime.  ? amLODipine (NORVASC) 5 MG tablet Take 1 tablet (5 mg total) by mouth daily.  ? atorvastatin (LIPITOR) 10 MG tablet TAKE 1 TABLET(10 MG) BY MOUTH DAILY  ? b complex vitamins tablet Take 1 tablet by mouth daily.  ? BIOTIN PO Take by mouth.  ? busPIRone (BUSPAR) 15 MG tablet Take 1 tablet (15 mg total) by mouth 2 (two) times daily.  ? Cholecalciferol (VITAMIN D-1000 MAX ST) 25 MCG (1000 UT) tablet Take 2,000 Units by mouth daily. Takes three 50 mcg tablets, 3 capsules daily  ? Emollient (COLLAGEN EX)   ? ferrous sulfate 325 (65 FE) MG tablet Take 1 tablet (325 mg total) by mouth 2 (two) times daily with a meal.  ? glipiZIDE (GLUCOTROL) 5 MG tablet Take 5 mg by mouth daily before breakfast.  ? glucose blood (ONETOUCH ULTRA) test strip USE TO check blood glucose ONCE DAILY AND AS NEEDED FOR DIABETES  ? Multiple Vitamins-Minerals (WOMENS MULTIVITAMIN PO) Take by mouth.  ? omeprazole (PRILOSEC) 20 MG capsule TAKE ONE CAPSULE BY MOUTH EVERYDAY AT BEDTIME  ?  pregabalin (LYRICA) 150 MG capsule Take 300 mg by mouth at bedtime.  ? rOPINIRole (REQUIP) 1 MG tablet TAKE 1/2 TABLET BY MOUTH EVERY MORNING and TAKE THREE TABLETS BY MOUTH EVERYDAY AT BEDTIME  ? Semaglutide,0.25 or 0.'5MG'$ /DOS, (OZEMPIC, 0.25 OR 0.5 MG/DOSE,) 2 MG/1.5ML SOPN Inject 0.5 mg into the skin once a week.  ? sertraline (ZOLOFT) 100 MG tablet Take 2 tablets (200 mg total) by mouth every morning.  ? Suvorexant (BELSOMRA) 20 MG TABS Take 20 mg by mouth at bedtime as needed.  ? zinc gluconate 50 MG tablet Take 50 mg by mouth daily.  ? ?Facility-Administered Encounter Medications as of 06/16/2021  ?Medication  ? influenza vaccine adjuvanted (FLUAD) injection 0.5 mL  ? sodium chloride flush (NS) 0.9 % injection 10 mL  ? ? ?Recent Office Vitals: ?BP Readings from Last 3 Encounters:  ?05/27/21 (!) 142/82  ?04/23/21 118/68  ?03/25/21 (!) 156/82  ? ?Pulse Readings from Last 3 Encounters:  ?05/27/21 77  ?04/23/21 (!) 108  ?03/25/21 94  ?  ?Wt Readings from Last 3 Encounters:  ?05/27/21 206 lb 3.2 oz (93.5 kg)  ?04/23/21 208 lb (94.3 kg)  ?03/25/21 208 lb 6 oz (94.5 kg)  ?  ? ?Kidney Function ?Lab Results  ?Component Value Date/Time  ? CREATININE 0.74 05/27/2021 12:55 PM  ? CREATININE 0.92 12/27/2020 01:58 PM  ? CREATININE 0.95 03/01/2014 08:46 AM  ? CREATININE 0.81 08/24/2013 10:15 AM  ? GFR 61.97 12/27/2020 01:58 PM  ?  GFRNONAA >60 05/27/2021 12:55 PM  ? GFRNONAA >60 03/01/2014 08:46 AM  ? GFRNONAA >60 08/24/2013 10:15 AM  ? GFRAA >60 10/20/2019 09:50 AM  ? GFRAA >60 03/01/2014 08:46 AM  ? GFRAA >60 08/24/2013 10:15 AM  ? ? ? ?  Latest Ref Rng & Units 05/27/2021  ? 12:55 PM 12/27/2020  ?  1:58 PM 12/11/2020  ?  3:42 AM  ?BMP  ?Glucose 70 - 99 mg/dL 102   96   105    ?BUN 8 - 23 mg/dL '15   13   30    '$ ?Creatinine 0.44 - 1.00 mg/dL 0.74   0.92   0.84    ?Sodium 135 - 145 mmol/L 139   140   138    ?Potassium 3.5 - 5.1 mmol/L 3.9   4.4   3.9    ?Chloride 98 - 111 mmol/L 101   102   107    ?CO2 22 - 32 mmol/L '28   30   28     '$ ?Calcium 8.9 - 10.3 mg/dL 8.7   8.4   7.6    ? ? ? ?Contacted patient on 06/16/21 to discuss hypertension disease state ? ?Current antihypertensive regimen:  ? Amlodipine 5 mg - Take 1 tablet daily  ? ? Patient verbally confirms she is taking the above medications as directed. Yes ? ?How often are you checking your Blood Pressure? weekly ? ?she checks her blood pressure in the morning after taking her medication. ? ?Current home BP readings:  ? ?DATE:             BP               PULSE ? 06/16/21  128/76  - ? 06/10/21 128/58  76  (office setting) ? 06/05/21 132/70  - ?  ? ? ?Wrist or arm cuff:arm cuff ?Caffeine intake: decaff only ?Salt intake: limits adding with cooking of meals  ?Over the counter medications including pseudoephedrine or NSAIDs? Tylenol as needed  ? ?Any readings above 180/120? No ? ? ?What recent interventions/DTPs have been made by any provider to improve Blood Pressure control since last CPP Visit:  none identified  ? ?Any recent hospitalizations or ED visits since last visit with CPP? No ? ?What diet changes have been made to improve Blood Pressure Control?  ? Patient limits adding salt to cooking and not adding to meals  ? ?What exercise is being done to improve your Blood Pressure Control?  ?  Patient reports she walks the dog daily and she continues to work a job. ? ?Adherence Review: ?Is the patient currently on ACE/ARB medication? No ?Does the patient have >5 day gap between last estimated fill dates? No ? ? ?Star Rating Drugs:  ?Medication:  Last Fill: Day Supply ?Ozempic  06/10/21 28 ?Glipizide '5mg'$   05/06/21 90 ?Atorvastatin '10mg'$  05/08/21 30 ? ? ? ?Care Gaps: ?Annual wellness visit in last year? Yes ?Most Recent BP reading:128/58 76-P  06/10/21 ? ?If Diabetic: ?Most recent A1C reading:6.9 ?Last eye exam / retinopathy screening:04/2020 ?Last diabetic foot exam:2019 ? ?Upcoming appointments: ?CCM appointment on 10/10/21 ? ? ?Charlene Brooke, CPP notified ? ?Leandrew Keech, CCMA ?Health concierge   ?[phone]  ?

## 2021-06-19 ENCOUNTER — Other Ambulatory Visit: Payer: Self-pay | Admitting: Family Medicine

## 2021-06-20 ENCOUNTER — Telehealth: Payer: Self-pay

## 2021-06-20 NOTE — Chronic Care Management (AMB) (Signed)
? ? ?Chronic Care Management ?Pharmacy Assistant  ? ?Name: Suzanne Strong  MRN: [id number] DOB: 07/02/1945 ? ?Reason for Encounter: Medication Adherence and Delivery Coordination ? ?Recent office visits:  ?None since last CCM contact ? ?Recent consult visits:  ?06/10/21-Jeffrey Byrnett,MD(gen surgery)-f/u mammogram ? ?Hospital visits:  ?None in previous 6 months ? ?Medications: ?Outpatient Encounter Medications as of 06/20/2021  ?Medication Sig  ? acetaminophen (TYLENOL) 325 MG tablet Take 2 tablets (650 mg total) by mouth every 6 (six) hours as needed for mild pain (or Fever >/= 101).  ? albuterol (VENTOLIN HFA) 108 (90 Base) MCG/ACT inhaler INHALE TWO PUFFS BY MOUTH EVERY SIX HOURS AS NEEDED  ? ALPRAZolam (XANAX) 0.5 MG tablet TAKE ONE TABLET BY MOUTH twice daily AS NEEDED FOR ANXIETY OR SLEEP  ? amitriptyline (ELAVIL) 50 MG tablet Take 1 tablet (50 mg total) by mouth at bedtime.  ? amLODipine (NORVASC) 5 MG tablet TAKE ONE TABLET BY MOUTH ONCE DAILY  ? atorvastatin (LIPITOR) 10 MG tablet TAKE ONE TABLET BY MOUTH ONCE DAILY  ? b complex vitamins tablet Take 1 tablet by mouth daily.  ? BIOTIN PO Take by mouth.  ? busPIRone (BUSPAR) 15 MG tablet Take 1 tablet (15 mg total) by mouth 2 (two) times daily.  ? Cholecalciferol (VITAMIN D-1000 MAX ST) 25 MCG (1000 UT) tablet Take 2,000 Units by mouth daily. Takes three 50 mcg tablets, 3 capsules daily  ? Emollient (COLLAGEN EX)   ? ferrous sulfate 325 (65 FE) MG tablet Take 1 tablet (325 mg total) by mouth 2 (two) times daily with a meal.  ? glipiZIDE (GLUCOTROL) 5 MG tablet Take 5 mg by mouth daily before breakfast.  ? glucose blood (ONETOUCH ULTRA) test strip USE TO check blood glucose ONCE DAILY AND AS NEEDED FOR DIABETES  ? Multiple Vitamins-Minerals (WOMENS MULTIVITAMIN PO) Take by mouth.  ? omeprazole (PRILOSEC) 20 MG capsule TAKE ONE CAPSULE BY MOUTH EVERYDAY AT BEDTIME  ? pregabalin (LYRICA) 150 MG capsule Take 300 mg by mouth at bedtime.  ? rOPINIRole (REQUIP) 1  MG tablet TAKE 1/2 TABLET BY MOUTH EVERY MORNING and TAKE THREE TABLETS BY MOUTH EVERYDAY AT BEDTIME  ? Semaglutide,0.25 or 0.'5MG'$ /DOS, (OZEMPIC, 0.25 OR 0.5 MG/DOSE,) 2 MG/1.5ML SOPN Inject 0.5 mg into the skin once a week.  ? sertraline (ZOLOFT) 100 MG tablet Take 2 tablets (200 mg total) by mouth every morning.  ? Suvorexant (BELSOMRA) 20 MG TABS Take 20 mg by mouth at bedtime as needed.  ? zinc gluconate 50 MG tablet Take 50 mg by mouth daily.  ? ?Facility-Administered Encounter Medications as of 06/20/2021  ?Medication  ? influenza vaccine adjuvanted (FLUAD) injection 0.5 mL  ? sodium chloride flush (NS) 0.9 % injection 10 mL  ? ? ?BP Readings from Last 3 Encounters:  ?05/27/21 (!) 142/82  ?04/23/21 118/68  ?03/25/21 (!) 156/82  ?  ?Lab Results  ?Component Value Date  ? HGBA1C 6.5 (H) 12/11/2020  ?  ? ? ?Recent OV, Consult or Hospital visit:   Mammogram results  ?No medication changes indicated ? ? ?Last adherence delivery date:06/12/21     ? ?Patient is due for next adherence delivery on: 07/18/21 ? ?Spoke with patient on 06/25/21 and reviewed medications. Patient denied the need for any medications at this time. ? ?This delivery to include: Adherence Packaging  30 Days  ?Packs: ?Pregabalin '150mg'$ - 2 daily (2 bedtime) ?Omeprazole '20mg'$ - 1 tablet daily (bedtime)  ?Ropinirole '1mg'$  - ? tablet (breakfast) and 3 tablets nightly (bedtime)  ?  Atorvastatin '10mg'$ - 1 tablet daily (breakfast)  ?Amlodipine '5mg'$ - 1 tablet daily (breakfast)   ?Amitriptyline '50mg'$ - 1 tablet daily (bedtime)  ?Alpha lipoic acid '200mg'$  - 1 capsule at( bedtime) ? ?VIAL medications: ?Albuterol 108 Inhaler- inhale 2 puffs every 6 hours as needed ? ? ?Patient declined the following medications this month: ?Alprazolam 0.'5mg'$ -  (should get delivery 07/10/21) ? ? ?Any concerns about your medications? No  discussed amount of pills on hand  ? ?How often do you forget or accidentally miss a dose? Never  The patient has access of medications ? ?Do you use a pillbox?  No ? ?Is patient in packaging Yes ? What is the date on your next pill pack? 06/15/21   still has  27 packs on hand  ? Any concerns or issues with your packaging? No concerns ? ? ?Refills requested from providers include: amlodipine,atorvastatin  ? ? Calculated new delivery date of 07/18/21 ? ?Recent blood pressure readings are as follows:  home reading  126/72  ? ? ?Recent blood glucose readings are as follows:none available  ? ?Annual wellness visit in last year? Yes ?Most Recent BP reading:142/82  77-P ? ?If Diabetic: ?Most recent A1C reading:6.9 ?Last eye exam / retinopathy screening:04/2020 ?Last diabetic foot exam:2019 ? ?Charlene Brooke, CPP notified ? ?Maysen Bonsignore, CCMA ?Health concierge  ?[phone]  ?

## 2021-06-23 ENCOUNTER — Encounter: Payer: Self-pay | Admitting: Family Medicine

## 2021-06-25 ENCOUNTER — Encounter: Payer: Self-pay | Admitting: Family Medicine

## 2021-06-27 ENCOUNTER — Telehealth: Payer: Self-pay

## 2021-06-27 NOTE — Chronic Care Management (AMB) (Signed)
Contacted the patient and scheduled a follow up with CPP to discuss surplus of medications.  Tuesday 07/01/21 @ 2:00pm telephone call. ? ?Charlene Brooke, CPP notified ? ?Meira Wahba, CCMA ?Health concierge  ?[phone]  ?

## 2021-07-01 ENCOUNTER — Telehealth: Payer: Self-pay | Admitting: Pharmacist

## 2021-07-01 ENCOUNTER — Telehealth: Payer: PPO

## 2021-07-01 NOTE — Telephone Encounter (Signed)
?  Chronic Care Management  ? ?Outreach Note ? ?07/01/2021 ?Name: Suzanne Strong MRN: [id number] DOB: 03-Feb-1946 ? ?Referred by: Tower, [name], MD ? ?Patient had a phone appointment scheduled with clinical pharmacist today. ? ?An unsuccessful telephone outreach was attempted today. The patient was referred to the pharmacist for assistance with medications, care management and care coordination.  ? ?Patient will NOT be penalized in any way for missing a CCM appointment. The no-show fee does not apply. ? ?If possible, a message was left to return call to: [phone] or to [facility]. ? ?Reason for call: ?Patient declined [facility] pill pack delivery this month due to abundance of supply on hand, however [facility] has been filling packs monthly for several months without issue so it is unclear why patient has several weeks of extra packs. ? ?She also gets about 1/2 of her medications through [facility] and need to see if patient would like to consolidate medications at one pharmacy for simplicity. ? ?Packs - 30 ds - 06/12/21 ?Pregabalin '150mg'$ - 2 daily (2 bedtime) ?Omeprazole '20mg'$ - 1 tablet daily (bedtime)  ?Ropinirole '1mg'$  - ? tablet (breakfast) and 3 tablets nightly (bedtime)  ?Atorvastatin '10mg'$ - 1 tablet daily (breakfast)  ?Amlodipine '5mg'$ - 1 tablet daily (breakfast)   ?Amitriptyline '50mg'$ - 1 tablet daily (bedtime)  ?Alpha lipoic acid '200mg'$  - 1 capsule at( bedtime) ? ?[facility]: ?Sertraline - 03/01/21 X 90 DS ?Buspirone - 05/05/21 X 90 DS ?Ozempic - 06/10/21 X 28 DS ?Belsomra - 06/09/21 X 30 DS ?Glipizide - 05/06/21 X 90 DS ?Tramadol - 06/02/21 #18 ? ? ?Charlene Brooke, PharmD, BCACP ?Clinical Pharmacist ?[facility] ?[phone] ? ? ?

## 2021-07-01 NOTE — Progress Notes (Deleted)
Chronic Care Management Pharmacy Note  07/01/2021 Name:  Suzanne Strong MRN:  [id number] DOB:  10/04/1945  Summary: CCM F/U visit -Pt endorses compliances with medications as prescribed -Pt was taken off metformin and added Ozempic 01/2021 per endocrine. Fasting BG is 90-120 and she denies issues  Recommendations/Changes made from today's visit: -No med changes; updated med list with Ozempic  Plan: -[facility] will call patient 3 months for BP log -Pharmacist follow up televisit scheduled for 6 months -AWV due 10/2021    Subjective: Suzanne Strong is an 76 y.o. year old female who is a primary patient of Tower, [name], MD.  The CCM team was consulted for assistance with disease management and care coordination needs.    Engaged with patient by telephone for follow up visit in response to provider referral for pharmacy case management and/or care coordination services.   Consent to Services:  The patient was given information about Chronic Care Management services, agreed to services, and gave verbal consent prior to initiation of services.  Please see initial visit note for detailed documentation.   Patient Care Team: Tower, [name], MD as PCP - General Byrnett, [name], MD (General Surgery) [name], MD (Oncology) Alda Berthold, DO as Consulting Physician (Neurology) Cammie Sickle, MD as Consulting Physician (Oncology) [name], [facility] (Pharmacist) Noreene Filbert, MD as Radiation Oncologist (Radiation Oncology)  Recent office visits: 04/23/2021 - [name], MD - Nausea, Vomiting, Diarrhea due to radiation. No med changes. 03/25/21- PCP- Roque Lias Tower,MD- Patient presented for follow up covid-19.Chest xray,start cefdinir 300mg  take 1 capsule 2 times daily,zithromax 250mg  take 2 pills today then 1 a day for 4 days, prednisone 30mg  taper, 3 pills for 3 days then 2 pills for 3 days then 1 a day. 03/18/21- PCP- Roque Lias Tower,MD VV -patient  presented for cough and fever, covid 19 test positive.Start Paxlovid 100mg  tabs-take as directed- isolate and mask-monitor and update in a week   Recent consult visits: 05/27/21-Oncology-Govinda Brahmanday,MD-Breast cancer follow up-no medication changes follow up 6 months. 04/25/2021 - Marin Olp, PA - Osteoarthritis of left knee joint. No other information.  02/12/21 Dr Gabriel Carina (endocrine): f/u DM. A1c 6.9%. Stop metformin. Start Ozempic 0.25 mg weekly.  Hospital visits: None in previous 6 months   Objective:  Lab Results  Component Value Date   CREATININE 0.74 05/27/2021   BUN 15 05/27/2021   GFR 61.97 12/27/2020   GFRNONAA >60 05/27/2021   GFRAA >60 10/20/2019   NA 139 05/27/2021   K 3.9 05/27/2021   CALCIUM 8.7 (L) 05/27/2021   CO2 28 05/27/2021   GLUCOSE 102 (H) 05/27/2021    Lab Results  Component Value Date/Time   HGBA1C 6.5 (H) 12/11/2020 03:42 AM   HGBA1C 6.7 (H) 05/28/2020 10:38 PM   HGBA1C 7.3 12/09/2016 12:00 AM   GFR 61.97 12/27/2020 01:58 PM   GFR 64.67 07/26/2020 08:06 AM    Last diabetic Eye exam:  Lab Results  Component Value Date/Time   HMDIABEYEEXA No Retinopathy 05/16/2021 12:00 AM    Last diabetic Foot exam: No results found for: HMDIABFOOTEX   Lab Results  Component Value Date   CHOL 121 02/16/2020   HDL 48 02/16/2020   LDLCALC 57 02/16/2020   LDLDIRECT 97.0 06/29/2017   TRIG 78 02/16/2020   CHOLHDL 2.5 02/16/2020       Latest Ref Rng & Units 05/27/2021   12:55 PM 12/27/2020    1:58 PM 12/11/2020    3:42 AM  Hepatic Function  Total Protein 6.5 - 8.1 g/dL 7.7   7.1   6.4    Albumin 3.5 - 5.0 g/dL 3.6   3.5   2.7    AST 15 - 41 U/L 18   18   112    ALT 0 - 44 U/L 14   9   35    Alk Phosphatase 38 - 126 U/L 84   77   43    Total Bilirubin 0.3 - 1.2 mg/dL 0.3   0.2   0.8      Lab Results  Component Value Date/Time   TSH 2.57 06/18/2020 11:04 AM   TSH 2.50 07/15/2018 09:27 AM       Latest Ref Rng & Units 05/27/2021   12:55 PM  12/27/2020    1:58 PM 12/11/2020    3:42 AM  CBC  WBC 4.0 - 10.5 K/uL 5.7   4.6   8.7    Hemoglobin 12.0 - 15.0 g/dL 11.3   10.0   9.3    Hematocrit 36.0 - 46.0 % 36.6   31.8   29.7    Platelets 150 - 400 K/uL 208   316.0   159      Lab Results  Component Value Date/Time   VD25OH 38.72 06/18/2020 11:04 AM   VD25OH 29.74 (L) 07/15/2018 09:27 AM    Clinical ASCVD: No  The ASCVD Risk score (Arnett DK, et al., 2019) failed to calculate for the following reasons:   The valid total cholesterol range is 130 to 320 mg/dL       11/09/2020   10:33 AM 07/15/2018    8:46 AM 06/29/2017    8:54 AM  Depression screen PHQ 2/9  Decreased Interest 0 0 0  Down, Depressed, Hopeless 0 0 0  PHQ - 2 Score 0 0 0  Altered sleeping  0 0  Tired, decreased energy  0 0  Change in appetite  0 0  Feeling bad or failure about yourself   0 0  Trouble concentrating  0 0  Moving slowly or fidgety/restless  0 0  Suicidal thoughts  0 0  PHQ-9 Score  0 0  Difficult doing work/chores  Not difficult at all Not difficult at all     Social History   Tobacco Use  Smoking Status Former   Packs/day: 0.50   Years: 6.00   Pack years: 3.00   Types: Cigarettes   Quit date: 03/10/1984   Years since quitting: 37.3  Smokeless Tobacco Never   BP Readings from Last 3 Encounters:  05/27/21 (!) 142/82  04/23/21 118/68  03/25/21 (!) 156/82   Pulse Readings from Last 3 Encounters:  05/27/21 77  04/23/21 (!) 108  03/25/21 94   Wt Readings from Last 3 Encounters:  05/27/21 206 lb 3.2 oz (93.5 kg)  04/23/21 208 lb (94.3 kg)  03/25/21 208 lb 6 oz (94.5 kg)   BMI Readings from Last 3 Encounters:  05/27/21 33.28 kg/m  04/23/21 33.57 kg/m  03/25/21 33.63 kg/m    Assessment/Interventions: Review of patient past medical history, allergies, medications, health status, including review of consultants reports, laboratory and other test data, was performed as part of comprehensive evaluation and provision of  chronic care management services.   SDOH:  (Social Determinants of Health) assessments and interventions performed: No - assessed 10/2020 at [facility]   Alcohol Screen: Low Risk    Last Alcohol Screening Score (AUDIT): 0  Depression (PHQ2-9): Low Risk  PHQ-2 Score: 0  Financial Resource Strain: Low Risk    Difficulty of Paying Living Expenses: Not very hard  Food Insecurity: No Food Insecurity   Worried About Programme researcher, broadcasting/film/video in the Last Year: Never true   Ran Out of Food in the Last Year: Never true  Housing: Low Risk    Last Housing Risk Score: 0  Physical Activity: Sufficiently Active   Days of Exercise per Week: 4 days   Minutes of Exercise per Session: 40 min  Social Connections: Moderately Integrated   Frequency of Communication with Friends and Family: Not on file   Frequency of Social Gatherings with Friends and Family: More than three times a week   Attends Religious Services: More than 4 times per year   Active Member of [employer] or Organizations: Yes   Attends Banker Meetings: More than 4 times per year   Marital Status: Widowed  Stress: No Stress Concern Present   Feeling of Stress : Not at all  Tobacco Use: Medium Risk   Smoking Tobacco Use: Former   Smokeless Tobacco Use: Never   Passive Exposure: Not on file  Transportation Needs: No Transportation Needs   Lack of Transportation (Medical): No   Lack of Transportation (Non-Medical): No    CCM Care Plan  Allergies  Allergen Reactions   Amoxicillin Swelling    REACTION: rash, swelling Has patient had a PCN reaction causing immediate rash, facial/tongue/throat swelling, SOB or lightheadedness with hypotension: yes Has patient had a PCN reaction causing severe rash involving mucus membranes or skin necrosis: no Has patient had a PCN reaction that required hospitalization: no Has patient had a PCN reaction occurring within the last 10 years: yes If all of the above answers are "NO",  then may proceed with Cephalosporin use.  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 Sig Documenting Provider Last Dose Status Informant  acetaminophen (TYLENOL) 325 MG tablet [id number]  Take 2 tablets (650 mg total) by mouth every 6 (six) hours as needed for mild pain (or Fever >/= 101). Zannie Cove, MD  Active Self  albuterol (VENTOLIN HFA) 108 (90 Base) MCG/ACT inhaler [id number]  INHALE TWO PUFFS BY MOUTH EVERY SIX HOURS AS NEEDED Tower, [name], MD  Active Self  ALPRAZolam Prudy Feeler) 0.5 MG tablet [id number]  TAKE ONE TABLET BY MOUTH twice daily AS NEEDED FOR ANXIETY OR SLEEP Corie Chiquito, PMHNP  Active   amitriptyline (ELAVIL) 50 MG tablet [id number]  Take 1 tablet (50 mg total) by mouth at bedtime. Earna Coder, MD  Active Self  amLODipine (NORVASC) 5 MG tablet [id number]  Take 1 tablet (5 mg total) by mouth daily. Tower, [name], MD  Active Self  atorvastatin (LIPITOR) 10 MG tablet [id number]  TAKE 1 TABLET(10 MG) BY MOUTH DAILY Tower, [name], MD  Active Self  b complex vitamins tablet [id number]  Take 1 tablet by mouth daily. [provider]  Active Self  BIOTIN PO [id number]  Take by mouth. [provider]  Active  busPIRone (BUSPAR) 15 MG tablet [id number]  Take 1 tablet (15 mg total) by mouth 2 (two) times daily. Corie Chiquito, PMHNP  Active   Cholecalciferol (VITAMIN D-1000 MAX ST) 25 MCG (1000 UT) tablet [id number]  Take 2,000 Units by mouth daily. Takes three 50 mcg tablets, 3 capsules daily [provider]  Active Self  Emollient  (COLLAGEN EX) [id number]   [provider]  Active Self  ferrous sulfate 325 (65 FE) MG tablet [id number]  Take 1 tablet (325 mg total) by mouth 2 (two) times daily with a meal. Zannie Cove, MD  Active Self  glipiZIDE (GLUCOTROL) 5 MG tablet [id number]  Take 5 mg by mouth daily before breakfast. [provider]  Active Self  glucose blood (ONETOUCH ULTRA) test strip [id number]  USE TO check blood glucose ONCE DAILY AND AS NEEDED FOR DIABETES Tower, [name], MD  Active Self  Multiple Vitamins-Minerals (WOMENS MULTIVITAMIN PO) [id number]  Take by mouth. [provider]  Active Self  omeprazole (PRILOSEC) 20 MG capsule [id number]  TAKE ONE CAPSULE BY MOUTH EVERYDAY AT BEDTIME Tower, [name], MD  Active Self  pregabalin (LYRICA) 150 MG capsule [id number]  Take 300 mg by mouth at bedtime. [provider]  Active Self  rOPINIRole (REQUIP) 1 MG tablet [id number]  TAKE 1/2 TABLET BY MOUTH EVERY MORNING and TAKE THREE TABLETS BY MOUTH EVERYDAY AT BEDTIME Tower, [name], MD  Active Self  Semaglutide,0.25 or 0.5MG /DOS, (OZEMPIC, 0.25 OR 0.5 MG/DOSE,) 2 MG/1.5ML SOPN [id number]  Inject 0.5 mg into the skin once a week. [provider]  Active   sertraline (ZOLOFT) 100 MG tablet [id number]  Take 2 tablets (200 mg total) by mouth every morning. Corie Chiquito, PMHNP  Active   Suvorexant (BELSOMRA) 20 MG TABS [id number]  Take 20 mg by mouth at bedtime as needed. Corie Chiquito, PMHNP  Active   traZODone (DESYREL) 50 MG tablet [id number]  Take 2 tablets (100 mg total) by mouth at bedtime. Corie Chiquito, PMHNP  Active   zinc gluconate 50 MG tablet [id number]  Take 50 mg by mouth daily. [provider]  Active             Patient Active Problem List   Diagnosis Date Noted   Nausea vomiting and diarrhea 04/23/2021   At high risk for aspiration 04/23/2021   COVID-19 03/18/2021   Rhabdomyolysis 12/20/2020   Aspiration pneumonia (HCC) 12/09/2020   Dysuria  07/17/2020   Dysphagia    Gastric erythema    Gastric polyp    Encounter for screening colonoscopy    Polyp of colon    History of aspiration pneumonia 02/15/2020   Diabetes mellitus without complication (HCC)    GERD (gastroesophageal reflux disease)    Hypertension    Urinary urgency 01/05/2020   Grief reaction 09/18/2019   Swallowing dysfunction 07/18/2019   Cough 02/21/2019   Fever, intermittent 09/01/2018   Pre-syncope 02/07/2018   Orthostatic hypotension 01/26/2018   Episodic weakness 01/26/2018   Medicare annual wellness visit, subsequent 06/29/2017   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   Preoperative examination 04/27/2017   Carpal tunnel syndrome of right wrist 11/13/2016   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HCC) History of  02/17/2016   Obstructive sleep apnea 01/01/2016   Carcinoma of upper-outer quadrant of left breast in female, estrogen receptor negative (HCC) 11/21/2015   Hypomagnesemia 08/28/2015   Fever 04/11/2015   Initial Medicare annual wellness visit 10/16/2014   Estrogen deficiency 10/16/2014   Colon cancer screening 10/16/2014  Controlled type 2 diabetes mellitus without complication ([place]) 32/99/2426   RLS (restless legs syndrome) 02/21/2014   Chronic neck pain 07/25/2010   Depression with anxiety 07/07/2010   Routine general medical examination at a health care facility 06/26/2010   B12 deficiency 09/04/2009   Anemia, iron deficiency 08/29/2009   Anxiety state 08/26/2009   GASTROPARESIS 08/26/2009   Vitamin D deficiency 07/19/2008   Disorder of bone and cartilage 07/19/2007   Hyperlipidemia 07/18/2007   EDEMA 07/18/2007   Skin lesion, superficial 06/02/2007    Immunization History  Administered Date(s) Administered   Fluad Quad(high Dose 65+) 11/26/2020   PFIZER(Purple Top)SARS-COV-2 Vaccination 03/30/2019, 04/20/2019, 11/11/2019   Pfizer Covid-19 Vaccine Bivalent Booster 79yrs & up 09/25/2020   Pneumococcal Conjugate-13 10/16/2014    Pneumococcal Polysaccharide-23 08/31/2012   Td 06/16/2001   Tdap 06/01/2012    Conditions to be addressed/monitored:  Hypertension, Hyperlipidemia, and Diabetes  There are no care plans that you recently modified to display for this patient.     Medication Assistance: None required.  Patient affirms current coverage meets needs.  Compliance/Adherence/Medication fill history: Care Gaps: Foot exam (due 07/06/18)  Star-Rating Drugs: Atorvastatin 10 mg - PDC 100% Janumet - PDC unavailable; LF 12/09/20 x 30 ds Glipizide - PDC 99%  Patient's preferred pharmacy is:  [facility] [address], [place] [address] Phone: [phone] Fax: [phone]  [facility] - [place], [place] - [address] [place] [address] Phone: [phone] Fax: [phone]  Uses pill box? Yes - pill packs Pt endorses 100% compliance  We discussed: Current pharmacy is preferred with insurance plan and patient is satisfied with pharmacy services Patient decided to: Utilize [facility] for medication synchronization, packaging and delivery  Care Plan and Follow Up Patient Decision:  Patient agrees to Care Plan and Follow-up.  Plan: Telephone follow up appointment with care management team member scheduled for:  6 months  Charlene Brooke, PharmD, BCACP Clinical Pharmacist [facility] [phone]    Current Barriers:  Unable to independently monitor therapeutic efficacy  Pharmacist Clinical Goal(s):  Patient will achieve adherence to monitoring guidelines and medication adherence to achieve therapeutic efficacy through collaboration with PharmD and provider.   Interventions: 1:1 collaboration with Tower, [name], MD regarding development and update of comprehensive plan of care as evidenced by provider attestation and co-signature Inter-disciplinary  care team collaboration (see longitudinal plan of care) Comprehensive medication review performed; medication list updated in electronic medical record  Hypertension (BP goal <130/80) -Unknown control - pt is not checking BP at home; BP was high in recent OV due to illness; Denies hypotensive/hypertensive symptoms -Current treatment: Amlodipine 5 mg daily - Appropriate, Effective, Safe, Accessible -Educated on BP goals and benefits of medications for prevention of heart attack, stroke and kidney damage; Importance of home blood pressure monitoring; -Counseled to monitor BP at home daily, document, and provide log at future appointments -Recommended to continue current medication  Hyperlipidemia: (LDL goal < 70) -Controlled - LDL is at goal; pt is compliant with pill packs -Current treatment: Atorvastatin 10 mg daily - Appropriate, Effective, Safe, Accessible -Educated on Cholesterol goals; Benefits of statin for ASCVD risk reduction; -Recommended to continue current medication  Diabetes (A1c goal <7%) -Controlled - A1c is at goal; pt endorses compliance with medications -Pt follows with [facility] -Current home glucose readings fasting glucose: 90-110s -Current medications: Glipizide 5 mg daily ([facility]) -Appropriate,  Effective, Safe, Accessible Ozempic 0.5 mg weekly ([facility]) - Appropriate, Effective, Safe, Accessible -Medications previously tried: Janumet (cost), metformin (diarrhea)  -Educated on A1c and blood sugar goals; Benefits of routine self-monitoring of blood sugar; -Counseled to check feet daily and get yearly eye exams -Recommended to continue current medication  Depression/Anxiety (Goal: manage symptoms) -Controlled -PHQ9: 0 - minimal depression -Connected with Wayne Both, PMHNP for mental health support -Current treatment: Sertraline 100 mg - 2 tab daily -Appropriate, Effective, Safe, Accessible Alprazolam 0.5 mg BID prn -Appropriate, Effective, Safe,  Accessible Buspirone 15 mg BID -Appropriate, Effective, Safe, Accessible Amitriptyline 50 mg HS -Appropriate, Effective, Safe, Accessible Trazodone 50 mg - 2 tab HS -Appropriate, Effective, Safe, Accessible -Medications previously tried/failed: citalopram, duloxetine, mirtazapine -Educated on Benefits of medication for symptom control -Recommended to continue current medication  Neuropathy / RLS (Goal: manage symptoms) -Controlled - per pt report -Current treatment  Pregabalin 150 mg - 2 cap HS -Appropriate, Effective, Safe, Accessible Ropinirole 1 mg - 1/2 tab AM, 3 tab HS -Appropriate, Effective, Safe, Accessible Ferrous sulfate 325 mg daily -Appropriate, Effective, Safe, Accessible Tramadol 50 mg PRN -Recommended to continue current medication  Health Maintenance -Vaccine gaps: Pneumovax, Shingrix, covid booster  Packs - 30 ds - 06/12/21 Pregabalin 150mg - 2 daily (2 bedtime) Omeprazole 20mg - 1 tablet daily (bedtime)  Ropinirole 1mg  -  tablet (breakfast) and 3 tablets nightly (bedtime)  Atorvastatin 10mg - 1 tablet daily (breakfast)  Amlodipine 5mg - 1 tablet daily (breakfast)   Amitriptyline 50mg - 1 tablet daily (bedtime)  Alpha lipoic acid 200mg  - 1 capsule at( bedtime)  [facility]: Sertraline - 03/01/21 X 90 DS Buspirone - 05/05/21 X 90 DS Trazodone - 03/18/21 X 90 DS Ozempic - 06/10/21 X 28 DS Belsomra - 06/09/21 X 30 DS Glipizide - 05/06/21 X 90 DS Tramadol - 06/02/21 #18   Patient Goals/Self-Care Activities Patient will:  - take medications as prescribed as evidenced by patient report and record review focus on medication adherence by pill packs check glucose daily, document, and provide at future appointments check blood pressure daily, document, and provide at future appointments

## 2021-07-02 ENCOUNTER — Telehealth: Payer: PPO

## 2021-07-02 ENCOUNTER — Other Ambulatory Visit: Payer: Self-pay | Admitting: Psychiatry

## 2021-07-02 DIAGNOSIS — F411 Generalized anxiety disorder: Secondary | ICD-10-CM

## 2021-07-03 ENCOUNTER — Encounter: Payer: Self-pay | Admitting: Family Medicine

## 2021-07-03 NOTE — Telephone Encounter (Signed)
Last filled 4/25, due 5/23

## 2021-07-04 ENCOUNTER — Encounter: Payer: Self-pay | Admitting: Family Medicine

## 2021-07-04 ENCOUNTER — Telehealth (INDEPENDENT_AMBULATORY_CARE_PROVIDER_SITE_OTHER): Payer: PPO | Admitting: Family Medicine

## 2021-07-04 DIAGNOSIS — J690 Pneumonitis due to inhalation of food and vomit: Secondary | ICD-10-CM | POA: Diagnosis not present

## 2021-07-04 MED ORDER — HYDROCOD POLI-CHLORPHE POLI ER 10-8 MG/5ML PO SUER: 5.0000 mL | Freq: Two times a day (BID) | ORAL | 0 refills | Status: DC | PRN

## 2021-07-04 MED ORDER — CEFDINIR 300 MG PO CAPS: 300.0000 mg | ORAL_CAPSULE | Freq: Two times a day (BID) | ORAL | 0 refills | Status: DC

## 2021-07-04 MED ORDER — AZITHROMYCIN 250 MG PO TABS: ORAL_TABLET | ORAL | 0 refills | Status: DC

## 2021-07-04 NOTE — Telephone Encounter (Signed)
Pt viewed results on mychart and responded. I also called pt and scheduled her in 4pm virtual visit today. ER precautions given and pt will do covid test and have results at her 4pm appt

## 2021-07-04 NOTE — Progress Notes (Addendum)
Virtual Visit via Video Note  I connected with Suzanne Strong on 07/04/21 at  4:00 PM EDT by a video enabled telemedicine application and verified that I am speaking with the correct person using two identifiers.  Location: Patient: home Provider: office    I discussed the limitations of evaluation and management by telemedicine and the availability of in person appointments. The patient expressed understanding and agreed to proceed.  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)  Temp got up to 101 - down to 98.7 with tylenol  Temp 99.8 now   Woke up yesterday am  with scratchy throat and a cough/deep Cough progressed and then fever started   Really tired  A little achy today/not bad  Mucous -pale yellow   Some nasal congestion- pale yellow  No headache  Throat is better today   Not really short of breath  Did use her inhaler some , this helps wheeze (very mild) does not feel like she needs prednisone   Covid test is negative at home   Here lately she tends to choke more easily  She is adjusting to a bottom partial (trying to eat smaller bites)  Does not tend to choke on liquids but has to watch out for hot liquid  Does not feel she need to thicken liquids   Otc Tylenol  Lots of fluids  In light of h/o asp pna  Sent in zpak and cefdinir - plans to start  Also tussionex (only takes at night since it is sedating)   Has a life alert Keeps phone on her  Her friend calls her daily    Lab Results  Component Value Date   CREATININE 0.74 05/27/2021   BUN 15 05/27/2021   NA 139 05/27/2021   K 3.9 05/27/2021   CL 101 05/27/2021   CO2 28 05/27/2021   Lab Results  Component Value Date    WBC 5.7 05/27/2021   HGB 11.3 (L) 05/27/2021   HCT 36.6 05/27/2021   MCV 88.8 05/27/2021   PLT 208 05/27/2021      Patient Active Problem List   Diagnosis Date Noted   Nausea vomiting and diarrhea 04/23/2021   At high risk for aspiration 04/23/2021   COVID-19 03/18/2021   Rhabdomyolysis 12/20/2020   Aspiration pneumonia ([place]) 12/09/2020   Dysuria 07/17/2020   Dysphagia    Gastric erythema    Gastric polyp    Encounter for screening colonoscopy    Polyp of colon    History of aspiration pneumonia 02/15/2020   Diabetes mellitus without complication ([place])    GERD (gastroesophageal reflux disease)    Hypertension    Urinary urgency 01/05/2020   Grief reaction 09/18/2019   Swallowing dysfunction 07/18/2019   Cough 02/21/2019   Fever, intermittent 09/01/2018   Pre-syncope 02/07/2018   Orthostatic hypotension 01/26/2018   Episodic weakness 01/26/2018   Medicare annual wellness visit, subsequent 06/29/2017   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   Preoperative examination 04/27/2017   Carpal tunnel syndrome of right wrist 11/13/2016   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[place]) History of  02/17/2016   Obstructive sleep apnea 01/01/2016   Carcinoma of upper-outer quadrant of left breast in female, estrogen receptor negative ([place]) 11/21/2015  Hypomagnesemia 08/28/2015   Fever 04/11/2015   Initial Medicare annual wellness visit 10/16/2014   Estrogen deficiency 10/16/2014   Colon cancer screening 10/16/2014   Controlled type 2 diabetes mellitus without complication ([place]) 47/42/5956   RLS (restless legs syndrome) 02/21/2014   Chronic neck pain 07/25/2010   Depression with anxiety 07/07/2010   Routine general medical examination at a health care facility 06/26/2010   B12 deficiency 09/04/2009   Anemia, iron deficiency 08/29/2009   Anxiety state 08/26/2009   GASTROPARESIS 08/26/2009   Vitamin D deficiency 07/19/2008   Disorder of bone and cartilage 07/19/2007   Hyperlipidemia  07/18/2007   EDEMA 07/18/2007   Skin lesion, superficial 06/02/2007   Past Medical History:  Diagnosis Date   Anemia    iron deficiency and B12 deficiency   Anxiety    Breast cancer ([place]) 02/25/2015   upper-outer quadrant of left female breast, Triple Negative. Neo-adjuvant chemo with complete pathologic response.    Cervical dysplasia    conization   Diabetes mellitus without complication ([name])    Diverticulosis    Fever 04/11/2015   Gastroparesis    related to previous radiation tx   GERD (gastroesophageal reflux disease)    Hyperlipidemia    Hypertension    Neuropathy    legs/feet, S/P chemo meds   Personal history of chemotherapy    Personal history of radiation therapy 2017   LEFT lumpectomy w/ radiation   RLS (restless legs syndrome)    Shingles    Hx of   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[place]) 2006   Wears dentures    partial bottom   Past Surgical History:  Procedure Laterality Date   APPENDECTOMY     BREAST BIOPSY Left 02/25/2015   INVASIVE MAMMARY CARCINOMA,Triple negative.   BREAST CYST ASPIRATION     BREAST EXCISIONAL BIOPSY Left 12/2015   surgery   BREAST LUMPECTOMY WITH NEEDLE LOCALIZATION Left 10/02/2015   Procedure: BREAST LUMPECTOMY WITH NEEDLE LOCALIZATION;  Surgeon: [name], MD;  Location: [facility];  Service: General;  Laterality: Left;   BREAST LUMPECTOMY WITH SENTINEL LYMPH NODE BIOPSY Left 10/02/2015   Procedure: LEFT BREAST WIDE EXCISION WITH SENTINEL LYMPH NODE BX;  Surgeon: [name], MD;  Location: [facility];  Service: General;  Laterality: Left;   BREAST SURGERY     breast biopsy benign   CARPAL TUNNEL RELEASE Right 05/19/2017   Procedure: CARPAL TUNNEL RELEASE;  Surgeon: Earnestine Leys, MD;  Location: [facility];  Service: Orthopedics;  Laterality: Right;   CATARACT EXTRACTION W/PHACO Right 10/26/2018   Procedure: CATARACT EXTRACTION PHACO AND INTRAOCULAR LENS PLACEMENT ([place]) right diabetic;  Surgeon: Leandrew Koyanagi, MD;  Location: [place];  Service: Ophthalmology;  Laterality: Right;  Diabetic - oral meds cancer center been notified and will come   CATARACT EXTRACTION W/PHACO Left 11/16/2018   Procedure: CATARACT EXTRACTION PHACO AND INTRAOCULAR LENS PLACEMENT ([facility]) LEFT DIABETIC  01:01.0  17.0%  10.37;  Surgeon: Leandrew Koyanagi, MD;  Location: [place];  Service: Ophthalmology;  Laterality: Left;  Diabetic - oral meds Port-a-cath   CHOLECYSTECTOMY     Cologuard  07/22/2016   Negative   COLONOSCOPY WITH PROPOFOL N/A 05/24/2020   Procedure: COLONOSCOPY WITH PROPOFOL;  Surgeon: Virgel Manifold, MD;  Location: [facility];  Service: Endoscopy;  Laterality: N/A;   ESOPHAGOGASTRODUODENOSCOPY  07/2009   normal with few gastric polyps   ESOPHAGOGASTRODUODENOSCOPY (EGD) WITH PROPOFOL N/A 05/24/2020   Procedure: ESOPHAGOGASTRODUODENOSCOPY (EGD) WITH PROPOFOL;  Surgeon: Virgel Manifold, MD;  Location: [facility];  Service: Endoscopy;  Laterality: N/A;   MOLE REMOVAL     PORT-A-CATH REMOVAL     PORTACATH PLACEMENT     PORTACATH PLACEMENT Right 03/12/2015   Procedure: INSERTION PORT-A-CATH;  Surgeon: [name], MD;  Location: [facility];  Service: General;  Laterality: Right;   RADICAL NECK DISSECTION     TONSILLECTOMY     cancer treated with chemo and radiation   TRIGGER FINGER RELEASE Right 05/19/2017   Procedure: RELEASE TRIGGER FINGER/A-1 PULLEY ring finger;  Surgeon: Earnestine Leys, MD;  Location: [facility];  Service: Orthopedics;  Laterality: Right;   Social History   Tobacco Use   Smoking status: Former    Packs/day: 0.50    Years: 6.00    Pack years: 3.00    Types: Cigarettes    Quit date: 03/10/1984    Years since quitting: 37.3   Smokeless tobacco: Never  Vaping Use   Vaping Use: Never used  Substance Use Topics   Alcohol use: No    Alcohol/week: 0.0 standard drinks   Drug use: No   Family History  Problem Relation Age of Onset   Osteoporosis Mother     Hyperlipidemia Mother    Depression Mother    Cancer Mother        skin cancer ? basal cell and lung ca heavy smoker   Alcohol abuse Father    Cancer Father        skin CA ? basal cell   Heart disease Father        CHF   Hyperlipidemia Brother    Hypertension Brother    Breast cancer Neg Hx    Allergies  Allergen Reactions   Amoxicillin Swelling    REACTION: rash, swelling Has patient had a PCN reaction causing immediate rash, facial/tongue/throat swelling, SOB or lightheadedness with hypotension: yes Has patient had a PCN reaction causing severe rash involving mucus membranes or skin necrosis: no Has patient had a PCN reaction that required hospitalization: no Has patient had a PCN reaction occurring within the last 10 years: yes If all of the above answers are "NO", then may proceed with Cephalosporin use.  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     albuterol (VENTOLIN HFA) 108 (90 Base) MCG/ACT inhaler INHALE TWO PUFFS BY MOUTH EVERY SIX HOURS AS NEEDED 8.5 g 3   ALPRAZolam (XANAX) 0.5 MG tablet TAKE ONE TABLET BY MOUTH twice daily AS NEEDED FOR ANXIETY OR SLEEP 60 tablet 5   amitriptyline (ELAVIL) 50 MG tablet Take 1 tablet (50 mg total) by mouth at bedtime. 90 tablet 3   amLODipine (NORVASC) 5 MG tablet TAKE ONE TABLET BY MOUTH ONCE DAILY 90 tablet 3   atorvastatin (LIPITOR) 10 MG tablet TAKE ONE TABLET BY MOUTH ONCE DAILY 90 tablet 3   azithromycin (ZITHROMAX Z-PAK) 250 MG tablet Take 2 pills by mouth  today and then 1 pill daily for 4 days 6 tablet 0  b complex vitamins tablet Take 1 tablet by mouth daily.     BIOTIN PO Take by mouth.     busPIRone (BUSPAR) 15 MG tablet Take 1 tablet (15 mg total) by mouth 2 (two) times daily. 180 tablet 1   cefdinir (OMNICEF) 300 MG capsule Take 1 capsule (300 mg total) by mouth 2 (two) times daily. 14 capsule 0   chlorpheniramine-HYDROcodone (TUSSIONEX PENNKINETIC ER) 10-8 MG/5ML Take 5 mLs by mouth every 12 (twelve) hours as needed for cough. 80 mL 0   Cholecalciferol (VITAMIN D-1000 MAX ST) 25 MCG (1000 UT) tablet Take 2,000 Units by mouth daily. Takes three 50 mcg tablets, 3 capsules daily     Emollient (COLLAGEN EX)      ferrous sulfate 325 (65 FE) MG tablet Take 1 tablet (325 mg total) by mouth 2 (two) times daily with a meal.     glipiZIDE (GLUCOTROL) 5 MG tablet Take 5 mg by mouth daily before breakfast.     glucose blood (ONETOUCH ULTRA) test strip USE TO check blood glucose ONCE DAILY AND AS NEEDED FOR DIABETES 100 strip 1   Multiple Vitamins-Minerals (WOMENS MULTIVITAMIN PO) Take by mouth.     omeprazole (PRILOSEC) 20 MG capsule TAKE ONE CAPSULE BY MOUTH EVERYDAY AT BEDTIME 90 capsule 3   pregabalin (LYRICA) 150 MG capsule Take 300 mg by mouth at bedtime.     rOPINIRole (REQUIP) 1 MG tablet TAKE 1/2 TABLET BY MOUTH EVERY MORNING and TAKE THREE TABLETS BY MOUTH EVERYDAY AT BEDTIME 315 tablet 5   Semaglutide,0.25 or 0.'5MG'$ /DOS, (OZEMPIC, 0.25 OR 0.5 MG/DOSE,) 2 MG/1.5ML SOPN Inject 0.5 mg into the skin once a week.     sertraline (ZOLOFT) 100 MG tablet Take 2 tablets (200 mg total) by mouth every morning. 180 tablet 1   Suvorexant (BELSOMRA) 20 MG TABS Take 20 mg by mouth at bedtime as needed. 30 tablet 5   zinc gluconate 50 MG tablet Take 50 mg by mouth daily.     Current Facility-Administered Medications on File Prior to Visit  Medication Dose Route Frequency Provider Last Rate Last Admin   influenza vaccine adjuvanted (FLUAD) injection 0.5 mL   0.5 mL Intramuscular Once Charlaine Dalton R, MD       sodium chloride flush (NS) 0.9 % injection 10 mL  10 mL Intravenous PRN Cammie Sickle, MD   10 mL at 09/11/15 0845   Review of Systems  Constitutional:  Positive for fever and malaise/fatigue. Negative for chills.  HENT:  Positive for congestion and sore throat. Negative for ear pain and sinus pain.   Eyes:  Negative for blurred vision, discharge and redness.  Respiratory:  Positive for cough, sputum production and wheezing. Negative for shortness of breath and stridor.   Cardiovascular:  Negative for chest pain, palpitations and leg swelling.  Gastrointestinal:  Negative for abdominal pain, diarrhea, nausea and vomiting.  Musculoskeletal:  Negative for myalgias.  Skin:  Negative for rash.  Neurological:  Negative for dizziness and headaches.   Observations/Objective: Pt sounds well but fatigued  Baseline hoarseness  Clears throat frequently  Occ dry sounding cough No audible wheezing  Nl affect No cognition /good historian  Assessment and Plan: Problem List Items Addressed This Visit       Respiratory   Aspiration pneumonia ([place])    Suspect this new episode of cough /fever and scant wheezing is another asp pneumonia (at risk due to radiation damage to throat) Neg covid test Started on cefdinir and zpak (tolerates  well in past) with tussionex prn (caution) Rest/fluids/close supervision  Has life alert button (has been severe in the past so also has friend call her daily) ER parmeters discussed Symptom care discussed  F/u next week/will likely image           Follow Up Instructions: Drink fluids and rest  Keep life alert with you   Tylenol if needed for fever   Take the cefdinir and zpak as directed Tussionex with caution of sedation at night   Update if not starting to improve in a week or if worsening   If short of breath or severe symptoms go to the ER Hit the life alert if needed   Follow up  in the office next week for a re check   I discussed the assessment and treatment plan with the patient. The patient was provided an opportunity to ask questions and all were answered. The patient agreed with the plan and demonstrated an understanding of the instructions.   The patient was advised to call back or seek an in-person evaluation if the symptoms worsen or if the condition fails to improve as anticipated.  I provided 17 minutes of non-face-to-face time during this encounter.   [name], MD

## 2021-07-04 NOTE — Assessment & Plan Note (Signed)
Suspect this new episode of cough /fever and scant wheezing is another asp pneumonia (at risk due to radiation damage to throat) Neg covid test Started on cefdinir and zpak (tolerates well in past) with tussionex prn (caution) Rest/fluids/close supervision  Has life alert button (has been severe in the past so also has friend call her daily) ER parmeters discussed Symptom care discussed  F/u next week/will likely image

## 2021-07-04 NOTE — Telephone Encounter (Signed)
She gets recurrent aspiration pna and we always have to treat as early as possible   Sent cefdinir and azithro to her [facility] to start now as well as tussionex she has taken these all in the past  Please call her to check in and urge her to do a covid test also  If the 4 pm slot for virtual is open please put her in it  Give ER precautions

## 2021-07-04 NOTE — Patient Instructions (Signed)
Drink fluids and rest  Keep life alert with you   Tylenol if needed for fever   Take the cefdinir and zpak as directed Tussionex with caution of sedation at night   Update if not starting to improve in a week or if worsening   If short of breath or severe symptoms go to the ER Hit the life alert if needed   Follow up in the office next week for a re check

## 2021-07-10 ENCOUNTER — Telehealth: Payer: Self-pay

## 2021-07-10 ENCOUNTER — Ambulatory Visit (INDEPENDENT_AMBULATORY_CARE_PROVIDER_SITE_OTHER): Payer: PPO | Admitting: Family Medicine

## 2021-07-10 ENCOUNTER — Encounter: Payer: Self-pay | Admitting: Family Medicine

## 2021-07-10 ENCOUNTER — Ambulatory Visit (INDEPENDENT_AMBULATORY_CARE_PROVIDER_SITE_OTHER)
Admission: RE | Admit: 2021-07-10 | Discharge: 2021-07-10 | Disposition: A | Discharge status: Home / Self Care | Payer: PPO | Source: Ambulatory Visit | Attending: Family Medicine | Admitting: Family Medicine

## 2021-07-10 VITALS — BP 130/68 | HR 70 | Temp 97.8°F | Ht 66.0 in | Wt 209.4 lb

## 2021-07-10 DIAGNOSIS — J9811 Atelectasis: Secondary | ICD-10-CM | POA: Diagnosis not present

## 2021-07-10 DIAGNOSIS — J690 Pneumonitis due to inhalation of food and vomit: Secondary | ICD-10-CM

## 2021-07-10 DIAGNOSIS — J189 Pneumonia, unspecified organism: Secondary | ICD-10-CM | POA: Diagnosis not present

## 2021-07-10 DIAGNOSIS — R059 Cough, unspecified: Secondary | ICD-10-CM | POA: Diagnosis not present

## 2021-07-10 MED ORDER — PREDNISONE 10 MG PO TABS: ORAL_TABLET | ORAL | 0 refills | Status: DC

## 2021-07-10 NOTE — Progress Notes (Signed)
Subjective:    Patient ID: Suzanne Strong, female    DOB: 06-Nov-1945, 76 y.o.   MRN: [id number]  HPI Pt presents for f/u of respiratory illness/ poss pna   Wt Readings from Last 3 Encounters:  07/10/21 209 lb 6 oz (95 kg)  07/04/21 202 lb (91.6 kg)  05/27/21 206 lb 3.2 oz (93.5 kg)   33.79 kg/m  H/o recurrent aspiration pna  Seen last-was developing cough and fever   Px cefdinir and zpack and tussionex   Now fever resolved /none since Sunday  Cough is bad (dry-no productive)  Breathing is fair - a lot of wheezing  Sob at times   Very tired , trying to work  Drinking lots of fluids   No new otc medicines  No cough med since the weekend/ does not like how tussionex makes her feel   DG Chest 2 View  Result Date: 07/10/2021 CLINICAL DATA:  Cough.  History of aspiration pneumonia. EXAM: CHEST - 2 VIEW COMPARISON:  Chest two views 03/25/2021, 12/09/2020, and 11/12/2020 FINDINGS: Cardiac silhouette and mediastinal contours within normal limits with moderate calcification again seen within aortic arch. Mild bilateral chronic interstitial thickening is unchanged from 03/25/2021. Mild left basilar linear subsegmental atelectasis appears similar to multiple prior radiographs including 03/25/2021 and 11/12/2020. The left basilar pneumonia seen in this region on 12/09/2020 is again resolved. No definite pleural effusion.  No pneumothorax. Mild dextrocurvature of the mid to lower thoracic spine with moderate multilevel degenerative disc changes. IMPRESSION: Mild left basilar linear likely subsegmental atelectasis versus scarring appears very similar to the patient's baseline radiographs. Pneumonia versus aspiration in this region is considered but felt less likely. Electronically Signed   By: Yvonne Kendall M.D.   On: 07/10/2021 08:41     Patient Active Problem List   Diagnosis Date Noted   Nausea vomiting and diarrhea 04/23/2021   At high risk for aspiration 04/23/2021   COVID-19  03/18/2021   Rhabdomyolysis 12/20/2020   Aspiration pneumonia ([place]) 12/09/2020   Dysuria 07/17/2020   Dysphagia    Gastric erythema    Gastric polyp    Encounter for screening colonoscopy    Polyp of colon    History of aspiration pneumonia 02/15/2020   Diabetes mellitus without complication ([place])    GERD (gastroesophageal reflux disease)    Hypertension    Urinary urgency 01/05/2020   Grief reaction 09/18/2019   Swallowing dysfunction 07/18/2019   Cough 02/21/2019   Fever, intermittent 09/01/2018   Pre-syncope 02/07/2018   Orthostatic hypotension 01/26/2018   Episodic weakness 01/26/2018   Medicare annual wellness visit, subsequent 06/29/2017   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   Preoperative examination 04/27/2017   Carpal tunnel syndrome of right wrist 11/13/2016   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  02/17/2016   Obstructive sleep apnea 01/01/2016   Carcinoma of upper-outer quadrant of left breast in female, estrogen receptor negative ([place]) 11/21/2015   Hypomagnesemia 08/28/2015   Fever 04/11/2015   Initial Medicare annual wellness visit 10/16/2014   Estrogen deficiency 10/16/2014   Colon cancer screening 10/16/2014   Controlled type 2 diabetes mellitus without complication ([place]) 31/54/0086   RLS (restless legs syndrome) 02/21/2014   Chronic neck pain 07/25/2010   Depression with anxiety 07/07/2010   Routine general medical examination at a health care facility 06/26/2010   B12 deficiency 09/04/2009   Anemia, iron deficiency 08/29/2009   Anxiety state 08/26/2009   GASTROPARESIS 08/26/2009   Vitamin D deficiency 07/19/2008   Disorder of  bone and cartilage 07/19/2007   Hyperlipidemia 07/18/2007   EDEMA 07/18/2007   Skin lesion, superficial 06/02/2007   Past Medical History:  Diagnosis Date   Anemia    iron deficiency and B12 deficiency   Anxiety    Breast cancer ([place]) 02/25/2015   upper-outer quadrant of left female breast, Triple Negative.  Neo-adjuvant chemo with complete pathologic response.    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   BREAST CYST ASPIRATION     BREAST EXCISIONAL BIOPSY Left 12/2015   surgery   BREAST LUMPECTOMY WITH NEEDLE LOCALIZATION Left 10/02/2015   Procedure: BREAST LUMPECTOMY WITH NEEDLE LOCALIZATION;  Surgeon: [name], MD;  Location: [facility];  Service: General;  Laterality: Left;   BREAST LUMPECTOMY WITH SENTINEL LYMPH NODE BIOPSY Left 10/02/2015   Procedure: LEFT BREAST WIDE EXCISION WITH SENTINEL LYMPH NODE BX;  Surgeon: [name], MD;  Location: [facility];  Service: General;  Laterality: Left;   BREAST SURGERY     breast biopsy benign   CARPAL TUNNEL RELEASE Right 05/19/2017   Procedure: CARPAL TUNNEL RELEASE;  Surgeon: Earnestine Leys, MD;  Location: [facility];  Service: Orthopedics;  Laterality: Right;   CATARACT EXTRACTION W/PHACO Right 10/26/2018   Procedure: CATARACT EXTRACTION PHACO AND INTRAOCULAR LENS PLACEMENT ([place]) right diabetic;  Surgeon: Leandrew Koyanagi, MD;  Location: [facility];  Service: Ophthalmology;  Laterality: Right;  Diabetic - oral meds cancer center been notified and will come   CATARACT EXTRACTION W/PHACO Left 11/16/2018   Procedure: CATARACT EXTRACTION PHACO AND INTRAOCULAR LENS PLACEMENT ([facility]) LEFT DIABETIC  01:01.0   17.0%  10.37;  Surgeon: Leandrew Koyanagi, MD;  Location: [facility];  Service: Ophthalmology;  Laterality: Left;  Diabetic - oral meds Port-a-cath   CHOLECYSTECTOMY     Cologuard  07/22/2016   Negative   COLONOSCOPY WITH PROPOFOL N/A 05/24/2020   Procedure: COLONOSCOPY WITH PROPOFOL;  Surgeon: Virgel Manifold, MD;  Location: [facility];  Service: Endoscopy;  Laterality: N/A;   ESOPHAGOGASTRODUODENOSCOPY  07/2009   normal with few gastric polyps   ESOPHAGOGASTRODUODENOSCOPY (EGD) WITH PROPOFOL N/A 05/24/2020   Procedure: ESOPHAGOGASTRODUODENOSCOPY (EGD) WITH PROPOFOL;  Surgeon: Virgel Manifold, MD;  Location: [facility];  Service: Endoscopy;  Laterality: N/A;   MOLE REMOVAL     PORT-A-CATH REMOVAL     PORTACATH PLACEMENT     PORTACATH PLACEMENT Right 03/12/2015   Procedure: INSERTION PORT-A-CATH;  Surgeon: [name], MD;  Location: [facility];  Service: General;  Laterality: Right;   RADICAL NECK DISSECTION     TONSILLECTOMY     cancer treated with chemo and radiation   TRIGGER FINGER RELEASE Right 05/19/2017   Procedure: RELEASE TRIGGER FINGER/A-1 PULLEY ring finger;  Surgeon: Earnestine Leys, MD;  Location:  [facility];  Service: Orthopedics;  Laterality: Right;   Social History   Tobacco Use   Smoking status: Former    Packs/day: 0.50    Years: 6.00    Pack years: 3.00    Types: Cigarettes    Quit date: 03/10/1984    Years since quitting: 37.3   Smokeless tobacco: Never  Vaping Use   Vaping Use: Never used  Substance Use Topics   Alcohol use: No    Alcohol/week: 0.0 standard drinks   Drug use: No   Family History  Problem Relation Age of Onset   Osteoporosis Mother    Hyperlipidemia Mother    Depression Mother    Cancer Mother        skin cancer ? basal cell and lung ca heavy smoker   Alcohol abuse Father    Cancer Father        skin CA ? basal cell   Heart disease Father        CHF   Hyperlipidemia Brother    Hypertension Brother     Breast cancer Neg Hx    Allergies  Allergen Reactions   Amoxicillin Swelling    REACTION: rash, swelling Has patient had a PCN reaction causing immediate rash, facial/tongue/throat swelling, SOB or lightheadedness with hypotension: yes Has patient had a PCN reaction causing severe rash involving mucus membranes or skin necrosis: no Has patient had a PCN reaction that required hospitalization: no Has patient had a PCN reaction occurring within the last 10 years: yes If all of the above answers are "NO", then may proceed with Cephalosporin use.  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     albuterol (VENTOLIN HFA) 108 (90 Base) MCG/ACT inhaler INHALE TWO PUFFS BY MOUTH EVERY SIX HOURS AS NEEDED 8.5 g 3   ALPRAZolam (XANAX) 0.5 MG tablet TAKE ONE TABLET BY MOUTH twice daily AS NEEDED FOR ANXIETY OR SLEEP 60 tablet 2   amitriptyline (ELAVIL) 50 MG tablet Take 1 tablet (50 mg total) by mouth at bedtime. 90 tablet 3   amLODipine (NORVASC) 5 MG tablet TAKE ONE TABLET BY MOUTH ONCE DAILY 90 tablet 3   atorvastatin (LIPITOR) 10 MG tablet TAKE ONE TABLET BY MOUTH ONCE DAILY 90 tablet 3   b complex vitamins tablet Take 1 tablet by mouth daily.     BIOTIN PO Take by mouth.     busPIRone (BUSPAR) 15 MG tablet Take 1 tablet (15 mg total) by mouth 2 (two) times daily. 180 tablet 1   Cholecalciferol (VITAMIN D-1000 MAX ST) 25 MCG (1000 UT) tablet Take 2,000 Units by mouth daily. Takes three 50 mcg tablets, 3 capsules daily      Emollient (COLLAGEN EX)      ferrous sulfate 325 (65 FE) MG tablet Take 1 tablet (325 mg total) by mouth 2 (two) times daily with a meal.     glipiZIDE (GLUCOTROL) 5 MG tablet Take 5 mg by mouth daily before breakfast.  glucose blood (ONETOUCH ULTRA) test strip USE TO check blood glucose ONCE DAILY AND AS NEEDED FOR DIABETES 100 strip 1   Multiple Vitamins-Minerals (WOMENS MULTIVITAMIN PO) Take by mouth.     omeprazole (PRILOSEC) 20 MG capsule TAKE ONE CAPSULE BY MOUTH EVERYDAY AT BEDTIME 90 capsule 3   pregabalin (LYRICA) 150 MG capsule Take 300 mg by mouth at bedtime.     rOPINIRole (REQUIP) 1 MG tablet TAKE 1/2 TABLET BY MOUTH EVERY MORNING and TAKE THREE TABLETS BY MOUTH EVERYDAY AT BEDTIME 315 tablet 5   Semaglutide,0.25 or 0.'5MG'$ /DOS, (OZEMPIC, 0.25 OR 0.5 MG/DOSE,) 2 MG/1.5ML SOPN Inject 0.5 mg into the skin once a week.     sertraline (ZOLOFT) 100 MG tablet Take 2 tablets (200 mg total) by mouth every morning. 180 tablet 1   Suvorexant (BELSOMRA) 20 MG TABS Take 20 mg by mouth at bedtime as needed. 30 tablet 5   zinc gluconate 50 MG tablet Take 50 mg by mouth daily.     Current Facility-Administered Medications on File Prior to Visit  Medication Dose Route Frequency Provider Last Rate Last Admin   influenza vaccine adjuvanted (FLUAD) injection 0.5 mL  0.5 mL Intramuscular Once Charlaine Dalton R, MD       sodium chloride flush (NS) 0.9 % injection 10 mL  10 mL Intravenous PRN Cammie Sickle, MD   10 mL at 09/11/15 0845     Review of Systems  Constitutional:  Positive for fatigue. Negative for activity change, appetite change, fever and unexpected weight change.  HENT:  Negative for congestion, ear pain, rhinorrhea, sinus pressure and sore throat.   Eyes:  Negative for pain, redness and visual disturbance.  Respiratory:  Positive for cough and wheezing. Negative for shortness of breath.   Cardiovascular:  Negative for chest pain and palpitations.  Gastrointestinal:   Negative for abdominal pain, blood in stool, constipation and diarrhea.  Endocrine: Negative for polydipsia and polyuria.  Genitourinary:  Negative for dysuria, frequency and urgency.  Musculoskeletal:  Negative for arthralgias, back pain and myalgias.  Skin:  Negative for pallor and rash.  Allergic/Immunologic: Negative for environmental allergies.  Neurological:  Negative for dizziness, syncope and headaches.  Hematological:  Negative for adenopathy. Does not bruise/bleed easily.  Psychiatric/Behavioral:  Negative for decreased concentration and dysphoric mood. The patient is not nervous/anxious.       Objective:   Physical Exam Constitutional:      General: She is not in acute distress.    Appearance: Normal appearance. She is well-developed. She is obese. She is not ill-appearing or diaphoretic.  HENT:     Head: Normocephalic and atraumatic.  Eyes:     Conjunctiva/sclera: Conjunctivae normal.     Pupils: Pupils are equal, round, and reactive to light.  Neck:     Thyroid: No thyromegaly.     Vascular: No carotid bruit or JVD.  Cardiovascular:     Rate and Rhythm: Normal rate and regular rhythm.     Heart sounds: Normal heart sounds.    No gallop.  Pulmonary:     Effort: Pulmonary effort is normal. No respiratory distress.     Breath sounds: Normal breath sounds. No stridor. No wheezing, rhonchi or rales.     Comments: Some rhonchi loudest in bilateral bases   Scant wheeze on forced expiration  Abdominal:     General: There is no distension or abdominal bruit.     Palpations: Abdomen is soft.  Musculoskeletal:     Cervical back: Normal  range of motion and neck supple.     Right lower leg: No edema.     Left lower leg: No edema.  Lymphadenopathy:     Cervical: No cervical adenopathy.  Skin:    General: Skin is warm and dry.     Coloration: Skin is not pale.     Findings: No rash.  Neurological:     Mental Status: She is alert.     Coordination: Coordination normal.      Deep Tendon Reflexes: Reflexes are normal and symmetric. Reflexes normal.  Psychiatric:        Mood and Affect: Mood normal.          Assessment & Plan:   Problem List Items Addressed This Visit       Respiratory   Aspiration pneumonia ([place]) - Primary    Suspected with recent symptoms (recurrent)  Finished recent cefdinir and azithro  Improved but still coughing with some wheezing/chest tightness  Recommend trying expectorant if needed Prednisone 40 mg taper sent (rev side effects)   No significant changes on CXR today  Will monitor closely         Relevant Orders   DG Chest 2 View (Completed)

## 2021-07-10 NOTE — Chronic Care Management (AMB) (Signed)
Patient calls in to say she will be out of several medications tomorrow, Alprazolam 0.'5mg'$ ,pregabalin '150mg'$ ,ropinirole '1mg'$ , omeprazole '20mg'$ ,amitriptyline '50mg'$ , Acute form complete for [facility].  Charlene Brooke, CPP notified  Avel Sensor, [place]  [phone]

## 2021-07-10 NOTE — Patient Instructions (Signed)
Chest xray today  Rest  Drink fluids  Take the prednisone as needed  Use your inhaler as needed    Pending xray reading - plan to follow  If you have trouble breathing -go to the ER  Keep [place] posted

## 2021-07-10 NOTE — Assessment & Plan Note (Addendum)
Suspected with recent symptoms (recurrent)  Finished recent cefdinir and azithro  Improved but still coughing with some wheezing/chest tightness  Recommend trying expectorant if needed Prednisone 40 mg taper sent (rev side effects)   No significant changes on CXR today  Will monitor closely

## 2021-07-22 ENCOUNTER — Other Ambulatory Visit: Payer: Self-pay

## 2021-07-22 ENCOUNTER — Encounter: Payer: Self-pay | Admitting: Family Medicine

## 2021-07-22 ENCOUNTER — Telehealth (INDEPENDENT_AMBULATORY_CARE_PROVIDER_SITE_OTHER): Payer: PPO | Admitting: Family Medicine

## 2021-07-22 ENCOUNTER — Emergency Department
Admission: EM | Admit: 2021-07-22 | Discharge: 2021-07-22 | Discharge status: Against Medical Advice | Payer: PPO | Attending: Emergency Medicine | Admitting: Emergency Medicine

## 2021-07-22 ENCOUNTER — Emergency Department: Payer: PPO

## 2021-07-22 ENCOUNTER — Encounter: Payer: Self-pay | Admitting: Emergency Medicine

## 2021-07-22 DIAGNOSIS — M2578 Osteophyte, vertebrae: Secondary | ICD-10-CM | POA: Diagnosis not present

## 2021-07-22 DIAGNOSIS — R0602 Shortness of breath: Secondary | ICD-10-CM | POA: Diagnosis not present

## 2021-07-22 DIAGNOSIS — J479 Bronchiectasis, uncomplicated: Secondary | ICD-10-CM | POA: Diagnosis not present

## 2021-07-22 DIAGNOSIS — J690 Pneumonitis due to inhalation of food and vomit: Secondary | ICD-10-CM

## 2021-07-22 DIAGNOSIS — Z20822 Contact with and (suspected) exposure to covid-19: Secondary | ICD-10-CM | POA: Insufficient documentation

## 2021-07-22 DIAGNOSIS — J189 Pneumonia, unspecified organism: Secondary | ICD-10-CM | POA: Diagnosis not present

## 2021-07-22 DIAGNOSIS — R509 Fever, unspecified: Secondary | ICD-10-CM | POA: Diagnosis not present

## 2021-07-22 DIAGNOSIS — R059 Cough, unspecified: Secondary | ICD-10-CM | POA: Diagnosis not present

## 2021-07-22 DIAGNOSIS — J929 Pleural plaque without asbestos: Secondary | ICD-10-CM | POA: Diagnosis not present

## 2021-07-22 LAB — BASIC METABOLIC PANEL
Anion gap: 10 (ref 5–15)
BUN: 22 mg/dL (ref 8–23)
CO2: 30 mmol/L (ref 22–32)
Calcium: 9.2 mg/dL (ref 8.9–10.3)
Chloride: 99 mmol/L (ref 98–111)
Creatinine, Ser: 0.9 mg/dL (ref 0.44–1.00)
GFR, Estimated: >60 mL/min (ref >60)
Glucose, Bld: 112 mg/dL — ABNORMAL HIGH (ref 70–99)
Potassium: 5.1 mmol/L (ref 3.5–5.1)
Sodium: 139 mmol/L (ref 135–145)

## 2021-07-22 LAB — CBC WITH DIFFERENTIAL/PLATELET
Abs Immature Granulocytes: 0.12 10*3/uL — ABNORMAL HIGH (ref 0.00–0.07)
Basophils Absolute: 0.1 10*3/uL (ref 0.0–0.1)
Basophils Relative: 1 %
Eosinophils Absolute: 0.3 10*3/uL (ref 0.0–0.5)
Eosinophils Relative: 3 %
HCT: 38.6 % (ref 36.0–46.0)
Hemoglobin: 11.8 g/dL — ABNORMAL LOW (ref 12.0–15.0)
Immature Granulocytes: 1 %
Lymphocytes Relative: 16 %
Lymphs Abs: 1.5 10*3/uL (ref 0.7–4.0)
MCH: 27.8 pg (ref 26.0–34.0)
MCHC: 30.6 g/dL (ref 30.0–36.0)
MCV: 90.8 fL (ref 80.0–100.0)
Monocytes Absolute: 0.5 10*3/uL (ref 0.1–1.0)
Monocytes Relative: 6 %
Neutro Abs: 6.7 10*3/uL (ref 1.7–7.7)
Neutrophils Relative %: 73 %
Platelets: 311 10*3/uL (ref 150–400)
RBC: 4.25 MIL/uL (ref 3.87–5.11)
RDW: 15.6 % — ABNORMAL HIGH (ref 11.5–15.5)
WBC: 9.1 10*3/uL (ref 4.0–10.5)
nRBC: 0 % (ref 0.0–0.2)

## 2021-07-22 LAB — CBC
HCT: 38.6 % (ref 36.0–46.0)
Hemoglobin: 11.9 g/dL — ABNORMAL LOW (ref 12.0–15.0)
MCH: 28.1 pg (ref 26.0–34.0)
MCHC: 30.8 g/dL (ref 30.0–36.0)
MCV: 91.3 fL (ref 80.0–100.0)
Platelets: 293 10*3/uL (ref 150–400)
RBC: 4.23 MIL/uL (ref 3.87–5.11)
RDW: 15.5 % (ref 11.5–15.5)
WBC: 9.3 10*3/uL (ref 4.0–10.5)
nRBC: 0 % (ref 0.0–0.2)

## 2021-07-22 LAB — SARS CORONAVIRUS 2 BY RT PCR: SARS Coronavirus 2 by RT PCR: NEGATIVE

## 2021-07-22 LAB — D-DIMER, QUANTITATIVE: D-Dimer, Quant: 0.99 ug/mL-FEU — ABNORMAL HIGH (ref 0.00–0.50)

## 2021-07-22 LAB — PROCALCITONIN: Procalcitonin: 0.16 ng/mL

## 2021-07-22 LAB — TROPONIN I (HIGH SENSITIVITY): Troponin I (High Sensitivity): 8 ng/L (ref <18)

## 2021-07-22 MED ORDER — CLINDAMYCIN HCL 150 MG PO CAPS: 300.0000 mg | ORAL_CAPSULE | Freq: Once | ORAL | Status: DC

## 2021-07-22 MED ORDER — AZITHROMYCIN 250 MG PO TABS: ORAL_TABLET | ORAL | 0 refills | Status: AC

## 2021-07-22 MED ORDER — CLINDAMYCIN HCL 300 MG PO CAPS
300.0000 mg | ORAL_CAPSULE | Freq: Three times a day (TID) | ORAL | 0 refills | Status: AC
Start: 2021-07-22 — End: 2021-08-01

## 2021-07-22 MED ORDER — IPRATROPIUM-ALBUTEROL 0.5-2.5 (3) MG/3ML IN SOLN
3.0000 mL | Freq: Once | RESPIRATORY_TRACT | Status: DC
  Filled 2021-07-22: qty 3

## 2021-07-22 MED ORDER — IOHEXOL 350 MG/ML SOLN
75.0000 mL | Freq: Once | INTRAVENOUS | Status: AC | PRN
  Administered 2021-07-22: 75 mL via INTRAVENOUS

## 2021-07-22 MED ORDER — IPRATROPIUM-ALBUTEROL 0.5-2.5 (3) MG/3ML IN SOLN
3.0000 mL | Freq: Once | RESPIRATORY_TRACT | Status: AC
  Administered 2021-07-22: 3 mL via RESPIRATORY_TRACT
  Filled 2021-07-22: qty 3

## 2021-07-22 MED ORDER — AZITHROMYCIN 500 MG PO TABS: 500.0000 mg | ORAL_TABLET | Freq: Once | ORAL | Status: DC

## 2021-07-22 MED ORDER — ALBUTEROL SULFATE HFA 108 (90 BASE) MCG/ACT IN AERS: 2.0000 | INHALATION_SPRAY | Freq: Four times a day (QID) | RESPIRATORY_TRACT | 2 refills | Status: DC | PRN

## 2021-07-22 NOTE — ED Triage Notes (Addendum)
Pt via POV from home. Pt c/o SOB and cough for the past 2 weeks, 5/25 and prescribed cefdinir and prednisone with no relief. Denies CP. States has been having fevers but states she has been taking Tylenol. Pt has a strong productive cough. Denies hx of COPD/CHF. PT voice is hoarse which is not normal for her states it been going on for the past week. Pt is A&OX4 and NAD.   Pt RA sat 79%, placed pt on 6L , and O2 increased to 92%

## 2021-07-22 NOTE — ED Notes (Signed)
Pt has declined her Neb treatment and has told this nurse every time I go in room that she needs to leave

## 2021-07-22 NOTE — ED Notes (Signed)
See triage note. Pt reports came to ED for cough and shob that was not getting any better since 5/25. Pt currently 100% on 6L [place], placed in triage. States does not wear O2 at home.  Cough noted. Pt voice is very hoarse

## 2021-07-22 NOTE — ED Notes (Signed)
It was reiterated with pt to please come back if she had any issues. Pt was told please take her meds and was asked again to please stay.

## 2021-07-22 NOTE — Progress Notes (Signed)
Virtual Visit via Video Note  I connected with Suzanne Strong on 07/22/21 at 12:30 PM EDT by a video enabled telemedicine application and verified that I am speaking with the correct person using two identifiers.  Location: Patient: home Provider: office   I discussed the limitations of evaluation and management by telemedicine and the availability of in person appointments. The patient expressed understanding and agreed to proceed.  Parties involved in encounter  Patient: Suzanne Strong   Provider:  [name] MD   Video failed today and visit changed to telephone  History of Present Illness: Pt presents for ongoing cough/congestion and hoarseness  She was seen 5/25 for presumed asp pna (which she gets often from chronic pharyngeal dysphagia)  Was tx with cefdinir and azithro Was improved but still wheezing Prednisone was px and cxr done  Seems like she is doing worse  Cough is worse  No voice at all  A little sore throat  A little fever  Not too short of breath  A lot of wheezing   Prednisone helped initially and then cough worse last Thursday   No nausea or diarrhea   Patient Active Problem List   Diagnosis Date Noted   Nausea vomiting and diarrhea 04/23/2021   At high risk for aspiration 04/23/2021   COVID-19 03/18/2021   Rhabdomyolysis 12/20/2020   Aspiration pneumonia ([place]) 12/09/2020   Dysuria 07/17/2020   Dysphagia    Gastric erythema    Gastric polyp    Encounter for screening colonoscopy    Polyp of colon    History of aspiration pneumonia 02/15/2020   Diabetes mellitus without complication ([place])    GERD (gastroesophageal reflux disease)    Hypertension    Urinary urgency 01/05/2020   Grief reaction 09/18/2019   Swallowing dysfunction 07/18/2019   Cough 02/21/2019   Fever, intermittent 09/01/2018   Pre-syncope 02/07/2018   Orthostatic hypotension 01/26/2018   Episodic weakness 01/26/2018   Medicare annual wellness visit, subsequent  06/29/2017   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) 11/21/2015   Hypomagnesemia 08/28/2015   Fever 04/11/2015   Initial Medicare annual wellness visit 10/16/2014   Estrogen deficiency 10/16/2014   Colon cancer screening 10/16/2014   Controlled type 2 diabetes mellitus without complication ([place]) 09/73/5329   RLS (restless legs syndrome) 02/21/2014   Chronic neck pain 07/25/2010   Depression with anxiety 07/07/2010   Routine general medical examination at a health care facility 06/26/2010   B12 deficiency 09/04/2009   Anemia, iron deficiency 08/29/2009   Anxiety state 08/26/2009   GASTROPARESIS 08/26/2009   Vitamin D deficiency 07/19/2008   Disorder of bone and cartilage 07/19/2007   Hyperlipidemia 07/18/2007   EDEMA 07/18/2007   Skin lesion, superficial 06/02/2007   Past Medical History:  Diagnosis Date   Anemia    iron deficiency and B12 deficiency   Anxiety    Breast cancer ([place]) 02/25/2015   upper-outer quadrant of left female breast, Triple Negative. Neo-adjuvant chemo with complete pathologic response.    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   BREAST CYST ASPIRATION     BREAST EXCISIONAL BIOPSY Left 12/2015   surgery   BREAST LUMPECTOMY WITH NEEDLE LOCALIZATION Left 10/02/2015   Procedure: BREAST LUMPECTOMY WITH NEEDLE LOCALIZATION;  Surgeon: [name], MD;  Location: [facility];  Service: General;  Laterality: Left;   BREAST LUMPECTOMY WITH SENTINEL LYMPH NODE BIOPSY Left 10/02/2015   Procedure: LEFT BREAST WIDE EXCISION WITH SENTINEL LYMPH NODE BX;  Surgeon: [name], MD;  Location: [facility];  Service: General;  Laterality: Left;   BREAST SURGERY     breast biopsy benign   CARPAL TUNNEL RELEASE Right 05/19/2017   Procedure: CARPAL TUNNEL RELEASE;  Surgeon: Earnestine Leys, MD;  Location: [facility];  Service: Orthopedics;  Laterality: Right;   CATARACT EXTRACTION W/PHACO Right 10/26/2018   Procedure: CATARACT EXTRACTION PHACO AND INTRAOCULAR LENS PLACEMENT ([place]) right diabetic;  Surgeon: Leandrew Koyanagi, MD;  Location: [facility];  Service: Ophthalmology;  Laterality: Right;  Diabetic - oral meds cancer center been notified and will come   CATARACT EXTRACTION W/PHACO Left 11/16/2018   Procedure: CATARACT EXTRACTION PHACO AND INTRAOCULAR LENS PLACEMENT ([facility]) LEFT DIABETIC  01:01.0  17.0%  10.37;  Surgeon: Leandrew Koyanagi, MD;  Location: [facility];  Service: Ophthalmology;  Laterality: Left;  Diabetic - oral meds Port-a-cath   CHOLECYSTECTOMY     Cologuard  07/22/2016   Negative   COLONOSCOPY WITH PROPOFOL N/A 05/24/2020   Procedure: COLONOSCOPY WITH PROPOFOL;  Surgeon: Virgel Manifold, MD;  Location: [facility];  Service: Endoscopy;  Laterality: N/A;   ESOPHAGOGASTRODUODENOSCOPY  07/2009   normal with few gastric polyps   ESOPHAGOGASTRODUODENOSCOPY (EGD) WITH PROPOFOL N/A 05/24/2020   Procedure: ESOPHAGOGASTRODUODENOSCOPY (EGD) WITH PROPOFOL;  Surgeon: Virgel Manifold, MD;  Location: [facility];   Service: Endoscopy;  Laterality: N/A;   MOLE REMOVAL     PORT-A-CATH REMOVAL     PORTACATH PLACEMENT     PORTACATH PLACEMENT Right 03/12/2015   Procedure: INSERTION PORT-A-CATH;  Surgeon: [name], MD;  Location: [facility];  Service: General;  Laterality: Right;   RADICAL NECK DISSECTION     TONSILLECTOMY     cancer treated with chemo and radiation   TRIGGER FINGER RELEASE Right 05/19/2017   Procedure: RELEASE TRIGGER FINGER/A-1 PULLEY ring finger;  Surgeon: Earnestine Leys, MD;  Location: [facility];  Service: Orthopedics;  Laterality: Right;   Social History   Tobacco Use   Smoking status: Former    Packs/day: 0.50    Years: 6.00    Pack years: 3.00    Types: Cigarettes    Quit date: 03/10/1984    Years since quitting: 37.3   Smokeless tobacco: Never  Vaping Use   Vaping Use: Never used  Substance Use Topics   Alcohol use: No    Alcohol/week: 0.0 standard drinks   Drug use: No   Family History  Problem Relation Age of Onset   Osteoporosis Mother    Hyperlipidemia  Mother    Depression Mother    Cancer Mother        skin cancer ? basal cell and lung ca heavy smoker   Alcohol abuse Father    Cancer Father        skin CA ? basal cell   Heart disease Father        CHF   Hyperlipidemia Brother    Hypertension Brother    Breast cancer Neg Hx    Allergies  Allergen Reactions   Amoxicillin Swelling    REACTION: rash, swelling Has patient had a PCN reaction causing immediate rash, facial/tongue/throat swelling, SOB or lightheadedness with hypotension: yes Has patient had a PCN reaction causing severe rash involving mucus membranes or skin necrosis: no Has patient had a PCN reaction that required hospitalization: no Has patient had a PCN reaction occurring within the last 10 years: yes If all of the above answers are "NO", then may proceed with Cephalosporin use.  Has tolerated ceftriaxone   Penicillin G Anaphylaxis    Has tolerated ceftriaxone on multiple occasions     Fluconazole Rash    REACTION: hives   Difuleron [Ferrous Fumarate-Dss]    Other Other (See Comments)    Pt has had tonsil cancer (on Left side) - pt may have difficulty swallowing   Current Outpatient Medications on File Prior to Visit  Medication Sig Dispense Refill   acetaminophen (TYLENOL) 325 MG tablet Take 2 tablets (650 mg total) by mouth every 6 (six) hours as needed for mild pain (or Fever >/= 101).     albuterol (VENTOLIN HFA) 108 (90 Base) MCG/ACT inhaler INHALE TWO PUFFS BY MOUTH EVERY SIX HOURS AS NEEDED 8.5 g 3   ALPRAZolam (XANAX) 0.5 MG tablet TAKE ONE TABLET BY MOUTH twice daily AS NEEDED FOR ANXIETY OR SLEEP 60 tablet 2   amitriptyline (ELAVIL) 50 MG tablet Take 1 tablet (50 mg total) by mouth at bedtime. 90 tablet 3   amLODipine (NORVASC) 5 MG tablet TAKE ONE TABLET BY MOUTH ONCE DAILY 90 tablet 3   atorvastatin (LIPITOR) 10 MG tablet TAKE ONE TABLET BY MOUTH ONCE DAILY 90 tablet 3   b complex vitamins tablet Take 1 tablet by mouth daily.     BIOTIN PO Take by mouth.     busPIRone (BUSPAR) 15 MG tablet Take 1 tablet (15 mg total) by mouth 2 (two) times daily. 180 tablet 1   Cholecalciferol (VITAMIN D-1000 MAX ST) 25 MCG (1000 UT) tablet Take 2,000 Units by mouth daily. Takes three 50 mcg tablets, 3 capsules daily     Emollient (COLLAGEN EX)      ferrous sulfate 325 (65 FE) MG tablet Take 1 tablet (325 mg total) by mouth 2 (two) times daily with a meal.     glipiZIDE (GLUCOTROL) 5 MG tablet Take 5 mg by mouth daily before breakfast.     glucose blood (ONETOUCH ULTRA) test strip USE TO check blood glucose ONCE DAILY AND AS NEEDED FOR DIABETES 100 strip 1   Multiple Vitamins-Minerals (WOMENS MULTIVITAMIN PO) Take by mouth.     omeprazole (PRILOSEC) 20 MG capsule TAKE ONE CAPSULE BY MOUTH EVERYDAY AT BEDTIME 90 capsule 3   pregabalin (LYRICA) 150 MG capsule Take 300 mg by mouth at bedtime.     rOPINIRole (REQUIP) 1 MG tablet TAKE 1/2 TABLET BY MOUTH EVERY MORNING and TAKE  THREE TABLETS BY MOUTH EVERYDAY AT BEDTIME 315 tablet 5   Semaglutide,0.25 or 0.'5MG'$ /DOS, (OZEMPIC, 0.25 OR 0.5 MG/DOSE,) 2  MG/1.5ML SOPN Inject 0.5 mg into the skin once a week.     sertraline (ZOLOFT) 100 MG tablet Take 2 tablets (200 mg total) by mouth every morning. 180 tablet 1   Suvorexant (BELSOMRA) 20 MG TABS Take 20 mg by mouth at bedtime as needed. 30 tablet 5   zinc gluconate 50 MG tablet Take 50 mg by mouth daily.     predniSONE (DELTASONE) 10 MG tablet Take 4 pills once daily by mouth for 3 days, then 3 pills daily for 3 days, then 2 pills daily for 3 days then 1 pill daily for 3 days then stop (Patient not taking: Reported on 07/22/2021) 30 tablet 0   Current Facility-Administered Medications on File Prior to Visit  Medication Dose Route Frequency Provider Last Rate Last Admin   influenza vaccine adjuvanted (FLUAD) injection 0.5 mL  0.5 mL Intramuscular Once Charlaine Dalton R, MD       sodium chloride flush (NS) 0.9 % injection 10 mL  10 mL Intravenous PRN Cammie Sickle, MD   10 mL at 09/11/15 0845   Review of Systems  Constitutional:  Positive for fever and malaise/fatigue. Negative for chills.       Lethargic  HENT:  Positive for congestion. Negative for ear pain, sinus pain and sore throat.   Eyes:  Negative for blurred vision, discharge and redness.  Respiratory:  Positive for cough, sputum production, shortness of breath and wheezing. Negative for hemoptysis and stridor.   Cardiovascular:  Negative for chest pain, palpitations and leg swelling.  Gastrointestinal:  Negative for abdominal pain, diarrhea, nausea and vomiting.  Musculoskeletal:  Negative for myalgias.  Skin:  Negative for rash.  Neurological:  Positive for headaches. Negative for dizziness.    DG Chest 2 View  Result Date: 07/10/2021 CLINICAL DATA:  Cough.  History of aspiration pneumonia. EXAM: CHEST - 2 VIEW COMPARISON:  Chest two views 03/25/2021, 12/09/2020, and 11/12/2020 FINDINGS: Cardiac  silhouette and mediastinal contours within normal limits with moderate calcification again seen within aortic arch. Mild bilateral chronic interstitial thickening is unchanged from 03/25/2021. Mild left basilar linear subsegmental atelectasis appears similar to multiple prior radiographs including 03/25/2021 and 11/12/2020. The left basilar pneumonia seen in this region on 12/09/2020 is again resolved. No definite pleural effusion.  No pneumothorax. Mild dextrocurvature of the mid to lower thoracic spine with moderate multilevel degenerative disc changes. IMPRESSION: Mild left basilar linear likely subsegmental atelectasis versus scarring appears very similar to the patient's baseline radiographs. Pneumonia versus aspiration in this region is considered but felt less likely. Electronically Signed   By: Yvonne Kendall M.D.   On: 07/10/2021 08:41       Observations/Objective: Voice is severely hoarse and faint  Very difficult to communicate Sounds mildly sob with audible wheezes   Assessment and Plan: Problem List Items Addressed This Visit       Respiratory   Aspiration pneumonia ([place])    Improved with last course of prednisone  Then worse again by Thursday  Now low grade temp, much worse wheezing  Hoarseness is worse- as well as sob   [facility]  She is high risk and may need breathing treatments  Plans to drive herself/feels well enough to drive  Will watch epic for correspondence         Follow Up Instructions: To to [facility] for evaluation now  If you are unable to drive please call [phone]     I discussed  the assessment and treatment plan with the patient. The patient was provided an opportunity to ask questions and all were answered. The patient agreed with the plan and demonstrated an understanding of the instructions.   The patient was advised to call back or seek an in-person evaluation if the symptoms worsen or if the condition fails to improve as  anticipated.  I provided 9 minutes of non-face-to-face time during this encounter.   [name], MD

## 2021-07-22 NOTE — Discharge Instructions (Addendum)
It really would be a lot safer if she stayed in the hospital and had someone watch her dog but since she will not I will give you an albuterol inhaler to use 2 puffs 4 times a day.  That should help a little bit with the cough.  The CT scan showed recurring pneumonia.  I will try some clindamycin 1 pill 3 times a day and a Z-Pak again.  If you are not improving in 2 or 3 days or you get worse again please do not hesitate to return.  Clindamycin occasionally can cause diarrhea that is bad.  If you have bad diarrhea please stop the clindamycin and return immediately.  Occasionally you can even get C. difficile from it that can be a severe serious infection.  Sometimes you need to stay in the hospital for that as well.  I think though that the clindamycin will be safe for you at the dose you are getting at.  It should have less side effects than the other choice that we could use which would be Levaquin.  Again it would be a lot better if you could stay in the hospital.  Please do not hesitate to return if you get worse.  We may need to change the antibiotics as well.

## 2021-07-22 NOTE — Patient Instructions (Signed)
Go to [facility] for evaluation now  I feel you need urgent attention for breathing/wheezing  If unable to drive please call [phone]

## 2021-07-22 NOTE — Assessment & Plan Note (Signed)
Improved with last course of prednisone  Then worse again by Thursday  Now low grade temp, much worse wheezing  Hoarseness is worse- as well as sob   Sent to ER at [facility] for urgent eval  She is high risk and may need breathing treatments  Plans to drive herself/feels well enough to drive  Will watch epic for correspondence

## 2021-07-22 NOTE — ED Provider Notes (Signed)
[facility] Provider Note    Event Date/Time   First MD Initiated Contact with Patient 07/22/21 1634     (approximate)   History   Shortness of Breath and Cough   HPI  Suzanne Strong is a 76 y.o. female who has been getting prednisone and cefdinir and previously cefdinir and Zithromax for presumed aspiration pneumonia.  Patient reports cough which is currently not productive but very deep wet and nonstop at home.  She was running a fever earlier in the week but is not now.  She has been taking Tylenol.  She denies any pleuritic chest pain.  O2 sats were 79 on arrival.  I discussed this in detail with the patient and said we should get her in the hospital for further treatment but she refuses.  She says she has a dog at home and she does not want to be in the hospital.  She knows that this could be a threat to her life.      Physical Exam   Triage Vital Signs: ED Triage Vitals  Enc Vitals Group     BP 07/22/21 1417 138/64     Pulse Rate 07/22/21 1417 77     Resp 07/22/21 1417 18     Temp 07/22/21 1417 98.6 F (37 C)     Temp src --      SpO2 07/22/21 1417 (!) 79 %     Weight 07/22/21 1411 206 lb (93.4 kg)     Height 07/22/21 1411 '5\' 6"'$  (1.676 m)     Head Circumference --      Peak Flow --      Pain Score 07/22/21 1411 0     Pain Loc --      Pain Edu? --      Excl. in [place]? --     Most recent vital signs: Vitals:   07/22/21 1930 07/22/21 2000  BP: (!) 133/59 (!) 154/62  Pulse:  84  Resp: 11 18  Temp:    SpO2:  97%     General: Awake, no distress! CV:  Good peripheral perfusion.  Resp:  Normal effort.  Good air movement occasional Abd:  No distention soft and nontender Extremities: No edema   ED Results / Procedures / Treatments   Labs (all labs ordered are listed, but only abnormal results are displayed) Labs Reviewed  BASIC METABOLIC PANEL - Abnormal; Notable for the following components:      Result Value   Glucose, Bld 112  (*)    All other components within normal limits  CBC - Abnormal; Notable for the following components:   Hemoglobin 11.9 (*)    All other components within normal limits  CBC WITH DIFFERENTIAL/PLATELET - Abnormal; Notable for the following components:   Hemoglobin 11.8 (*)    RDW 15.6 (*)    Abs Immature Granulocytes 0.12 (*)    All other components within normal limits  D-DIMER, QUANTITATIVE - Abnormal; Notable for the following components:   D-Dimer, Quant 0.99 (*)    All other components within normal limits  SARS CORONAVIRUS 2 BY RT PCR  PROCALCITONIN  DIFFERENTIAL  TROPONIN I (HIGH SENSITIVITY)  TROPONIN I (HIGH SENSITIVITY)     EKG  EKG read and interpreted by me shows normal sinus rhythm rate of 75 leftward axis somewhat decreased R wave progression no acute ST-T changes are seen   RADIOLOGY Chest x-ray read by radiology films also reviewed and interpreted by me are consistent  with a left-sided lower pneumonia. CT read by radiology reviewed and interpreted by me shows bilateral infiltrates in the bases.  Her previous physicians a thought that she has been aspirating.  I will give her clindamycin and Z-Pak in case of any atypicals.  She has had Z-Pak several times though.  She is also had cefdinir before.  PROCEDURES:  Critical Care performed: Critical care time 20 minutes this includes talking with the patient several times and tried to talk her into staying in the hospital but she will not.  Procedures   MEDICATIONS ORDERED IN ED: Medications  ipratropium-albuterol (DUONEB) 0.5-2.5 (3) MG/3ML nebulizer solution 3 mL (3 mLs Nebulization Not Given 07/22/21 2015)  clindamycin (CLEOCIN) capsule 300 mg (has no administration in time range)  azithromycin (ZITHROMAX) tablet 500 mg (has no administration in time range)  iohexol (OMNIPAQUE) 350 MG/ML injection 75 mL (75 mLs Intravenous Contrast Given 07/22/21 1816)  ipratropium-albuterol (DUONEB) 0.5-2.5 (3) MG/3ML nebulizer  solution 3 mL (3 mLs Nebulization Given 07/22/21 1917)     IMPRESSION / MDM / ASSESSMENT AND PLAN / ED COURSE  I reviewed the triage vital signs and the nursing notes. Patient should be staying in the hospital but refuses.  She understands that she is at risk for dying if she goes home.  She understands that her oxygen level is very low.  She refuses to stay though.  She has no one to watch her dog she says.  There is no way of getting anyone to watch her dog and does not want to stay anyway. Patient is having a pneumonia thought possibly to be aspiration.  I have given her DuoNeb twice and she says feels somewhat better although she really did not have any thing on the lung exam.  She insists on going home still.  I will let her go under protest.  I have documented this also in the discharge instructions.  We will try the p.o. antibiotics and have her come back if she is worse.  I have given her an albuterol inhaler again.  Patient's presentation is most consistent with acute presentation with potential threat to life or bodily function.  The patient is on the cardiac monitor to evaluate for evidence of arrhythmia and/or significant heart rate changes.  None have been seen    FINAL CLINICAL IMPRESSION(S) / ED DIAGNOSES   Final diagnoses:  Community acquired pneumonia, unspecified laterality     Rx / DC Orders   ED Discharge Orders          Ordered    albuterol (VENTOLIN HFA) 108 (90 Base) MCG/ACT inhaler  Every 6 hours PRN        07/22/21 2029    clindamycin (CLEOCIN) 300 MG capsule  3 times daily        07/22/21 2029    azithromycin (ZITHROMAX Z-PAK) 250 MG tablet        07/22/21 2029             Note:  This document was prepared using Dragon voice recognition software and may include unintentional dictation errors.   Nena Polio, MD 07/22/21 2035

## 2021-07-22 NOTE — ED Notes (Signed)
Pt left before receiving meds.

## 2021-08-05 ENCOUNTER — Telehealth: Payer: Self-pay | Admitting: Psychiatry

## 2021-08-05 DIAGNOSIS — F5101 Primary insomnia: Secondary | ICD-10-CM

## 2021-08-05 MED ORDER — TRAZODONE HCL 50 MG PO TABS: ORAL_TABLET | ORAL | 0 refills | Status: DC

## 2021-08-05 NOTE — Telephone Encounter (Signed)
Patient lvm requesting an Rx for the Trazodone. A follow up appointment is scheduled for 9/18.

## 2021-08-05 NOTE — Telephone Encounter (Signed)
Please let her know it was sent to [facility].

## 2021-08-05 NOTE — Telephone Encounter (Signed)
Pt stated she is out of belsomra samples and can't afford it.She wants trazodone sent to [facility] I can send if you let me know the dose since it's no longer on med list

## 2021-08-08 ENCOUNTER — Telehealth: Payer: Self-pay

## 2021-08-08 NOTE — Chronic Care Management (AMB) (Signed)
Chronic Care Management Pharmacy Assistant   Name: SHENEIKA MERLIN  MRN: [id number] DOB: 09/06/1945   Reason for Encounter: Medication Adherence and Delivery Coordination   CPP  Recent office visits:  07/22/21-Marne Tower,MD(PCP) Telemedicine, cough,congestion-Referred to ED  Recent consult visits:  None since last CCM contact  [facility]  [facility]- EKG,Chest xray,breathing treatment,start Azithromycin 250mg  take daily for 5 days,start clindamycin 300mg  take 1 tablet 3 times daily ,Albuterol 108 -take 2 puffs every 6 hours as needed. No admission   Medications: Outpatient Encounter Medications as of 08/08/2021  Medication Sig   acetaminophen (TYLENOL) 325 MG tablet Take 2 tablets (650 mg total) by mouth every 6 (six) hours as needed for mild pain (or Fever >/= 101).   albuterol (VENTOLIN HFA) 108 (90 Base) MCG/ACT inhaler INHALE TWO PUFFS BY MOUTH EVERY SIX HOURS AS NEEDED   albuterol (VENTOLIN HFA) 108 (90 Base) MCG/ACT inhaler Inhale 2 puffs into the lungs every 6 (six) hours as needed for wheezing or shortness of breath.   ALPRAZolam (XANAX) 0.5 MG tablet TAKE ONE TABLET BY MOUTH twice daily AS NEEDED FOR ANXIETY OR SLEEP   amitriptyline (ELAVIL) 50 MG tablet Take 1 tablet (50 mg total) by mouth at bedtime.   amLODipine (NORVASC) 5 MG tablet TAKE ONE TABLET BY MOUTH ONCE DAILY   atorvastatin (LIPITOR) 10 MG tablet TAKE ONE TABLET BY MOUTH ONCE DAILY   b complex vitamins tablet Take 1 tablet by mouth daily.   BIOTIN PO Take by mouth.   busPIRone (BUSPAR) 15 MG tablet Take 1 tablet (15 mg total) by mouth 2 (two) times daily.   Cholecalciferol (VITAMIN D-1000 MAX ST) 25 MCG (1000 UT) tablet Take 2,000 Units by mouth daily. Takes three 50 mcg tablets, 3 capsules daily   Emollient (COLLAGEN EX)    ferrous sulfate 325 (65 FE) MG tablet Take 1 tablet (325 mg total) by mouth 2 (two) times daily with a meal.   glipiZIDE (GLUCOTROL) 5 MG tablet  Take 5 mg by mouth daily before breakfast.   glucose blood (ONETOUCH ULTRA) test strip USE TO check blood glucose ONCE DAILY AND AS NEEDED FOR DIABETES   Multiple Vitamins-Minerals (WOMENS MULTIVITAMIN PO) Take by mouth.   omeprazole (PRILOSEC) 20 MG capsule TAKE ONE CAPSULE BY MOUTH EVERYDAY AT BEDTIME   predniSONE (DELTASONE) 10 MG tablet Take 4 pills once daily by mouth for 3 days, then 3 pills daily for 3 days, then 2 pills daily for 3 days then 1 pill daily for 3 days then stop (Patient not taking: Reported on 07/22/2021)   pregabalin (LYRICA) 150 MG capsule Take 300 mg by mouth at bedtime.   rOPINIRole (REQUIP) 1 MG tablet TAKE 1/2 TABLET BY MOUTH EVERY MORNING and TAKE THREE TABLETS BY MOUTH EVERYDAY AT BEDTIME   Semaglutide,0.25 or 0.5MG /DOS, (OZEMPIC, 0.25 OR 0.5 MG/DOSE,) 2 MG/1.5ML SOPN Inject 0.5 mg into the skin once a week.   sertraline (ZOLOFT) 100 MG tablet Take 2 tablets (200 mg total) by mouth every morning.   Suvorexant (BELSOMRA) 20 MG TABS Take 20 mg by mouth at bedtime as needed.   traZODone (DESYREL) 50 MG tablet Take 1-2 tabs po QHS prn insomnia   zinc gluconate 50 MG tablet Take 50 mg by mouth daily.   Facility-Administered Encounter Medications as of 08/08/2021  Medication   influenza vaccine adjuvanted (FLUAD) injection 0.5 mL   sodium chloride flush (NS) 0.9 % injection 10 mL    BP Readings from  Last 3 Encounters:  07/22/21 111/64  07/10/21 130/68  07/04/21 113/60    Lab Results  Component Value Date   HGBA1C 6.5 (H) 12/11/2020      Recent OV, Consult or Hospital visit:   07/22/21  [facility] ED Recent medication changes indicated:  start Azithromycin 250mg  take daily for 5 days,start clindamycin 300mg  take 1 tablet 3 times daily ,Albuterol 108 -take 2 puffs every 6 hours   Last adherence delivery date:07/18/21      Patient is due for next adherence delivery on: 08/19/21  Spoke with patient on 08/08/21 and reviewed medications. Patient denied the need for any  medications at this time.  This delivery to include: Adherence Packaging  30 Days  Packs: None requested  VIAL medications: None requested   Patient declined the following medications this month: Pregabalin 150mg - 2 daily (2 bedtime) Omeprazole 20mg - 1 tablet daily (bedtime)  Ropinirole 1mg  -  tablet (breakfast) and 3 tablets nightly (bedtime)  Atorvastatin 10mg - 1 tablet daily (breakfast)  Amlodipine 5mg - 1 tablet daily (breakfast)   Amitriptyline 50mg - 1 tablet daily (bedtime)  Alpha lipoic acid 200mg  - 1 capsule at( bedtime) Albuterol 108 Inhaler- inhale 2 puffs every 6 hours as needed  Any concerns about your medications? Patient still has 30ds of all meds due to having pneumonia and not taking medications while she is sick  How often do you forget or accidentally miss a dose? Rarely- has missed some medications while having pneumonoia  Do you use a pillbox? No  Is patient in packaging Yes  What is the date on your next pill pack?  Has unopened box from [facility]  Any concerns or issues with your packaging? No concerns at this time    No refill request needed.   Recent blood pressure readings are as follows:none available    Recent blood glucose readings are as follows:none available     Annual wellness visit in last year? Yes Most Recent BP reading:130/68  70-P 07/10/21  If Diabetic: Most recent A1C reading:6.9 Last eye exam / retinopathy screening:04/2020 Last diabetic foot exam:2019  Al Corpus, CPP notified  Burt Knack, [facility] concierge  [phone]

## 2021-08-18 ENCOUNTER — Ambulatory Visit (INDEPENDENT_AMBULATORY_CARE_PROVIDER_SITE_OTHER): Payer: PPO | Admitting: Family Medicine

## 2021-08-18 ENCOUNTER — Ambulatory Visit (INDEPENDENT_AMBULATORY_CARE_PROVIDER_SITE_OTHER)
Admission: RE | Admit: 2021-08-18 | Discharge: 2021-08-18 | Disposition: A | Discharge status: Home / Self Care | Payer: PPO | Source: Ambulatory Visit | Attending: Family Medicine | Admitting: Family Medicine

## 2021-08-18 ENCOUNTER — Encounter: Payer: Self-pay | Admitting: Family Medicine

## 2021-08-18 VITALS — BP 122/60 | HR 84 | Temp 98.0°F | Ht 66.0 in | Wt 203.0 lb

## 2021-08-18 DIAGNOSIS — R059 Cough, unspecified: Secondary | ICD-10-CM | POA: Diagnosis not present

## 2021-08-18 LAB — COMPREHENSIVE METABOLIC PANEL
ALT: 12 U/L (ref 0–35)
AST: 16 U/L (ref 0–37)
Albumin: 3.7 g/dL (ref 3.5–5.2)
Alkaline Phosphatase: 60 U/L (ref 39–117)
BUN: 23 mg/dL (ref 6–23)
CO2: 26 mEq/L (ref 19–32)
Calcium: 8.3 mg/dL — ABNORMAL LOW (ref 8.4–10.5)
Chloride: 99 mEq/L (ref 96–112)
Creatinine, Ser: 0.98 mg/dL (ref 0.40–1.20)
GFR: 57.18 mL/min — ABNORMAL LOW (ref >60.00)
Glucose, Bld: 108 mg/dL — ABNORMAL HIGH (ref 70–99)
Potassium: 3.1 mEq/L — ABNORMAL LOW (ref 3.5–5.1)
Sodium: 136 mEq/L (ref 135–145)
Total Bilirubin: 0.4 mg/dL (ref 0.2–1.2)
Total Protein: 7.3 g/dL (ref 6.0–8.3)

## 2021-08-18 LAB — CBC WITH DIFFERENTIAL/PLATELET
Basophils Absolute: 0 10*3/uL (ref 0.0–0.1)
Basophils Relative: 0.3 % (ref 0.0–3.0)
Eosinophils Absolute: 0.2 10*3/uL (ref 0.0–0.7)
Eosinophils Relative: 2 % (ref 0.0–5.0)
HCT: 33 % — ABNORMAL LOW (ref 36.0–46.0)
Hemoglobin: 11 g/dL — ABNORMAL LOW (ref 12.0–15.0)
Lymphocytes Relative: 16.2 % (ref 12.0–46.0)
Lymphs Abs: 1.3 10*3/uL (ref 0.7–4.0)
MCHC: 33.3 g/dL (ref 30.0–36.0)
MCV: 87.5 fl (ref 78.0–100.0)
Monocytes Absolute: 0.4 10*3/uL (ref 0.1–1.0)
Monocytes Relative: 5.7 % (ref 3.0–12.0)
Neutro Abs: 6 10*3/uL (ref 1.4–7.7)
Neutrophils Relative %: 75.8 % (ref 43.0–77.0)
Platelets: 261 10*3/uL (ref 150.0–400.0)
RBC: 3.78 Mil/uL — ABNORMAL LOW (ref 3.87–5.11)
RDW: 15.3 % (ref 11.5–15.5)
WBC: 7.9 10*3/uL (ref 4.0–10.5)

## 2021-08-18 MED ORDER — GLIPIZIDE 5 MG PO TABS: 10.0000 mg | ORAL_TABLET | Freq: Every day | ORAL | Status: DC

## 2021-08-18 MED ORDER — DOXYCYCLINE HYCLATE 100 MG PO TABS: 100.0000 mg | ORAL_TABLET | Freq: Two times a day (BID) | ORAL | 0 refills | Status: DC

## 2021-08-18 MED ORDER — METFORMIN HCL 500 MG PO TABS: 1000.0000 mg | ORAL_TABLET | Freq: Two times a day (BID) | ORAL | Status: DC

## 2021-08-18 NOTE — Patient Instructions (Addendum)
Go to the lab on the way out.   If you have mychart we'll likely use that to update you.    Rest and fluids.   Use albuterol if needed, start doxy.  If more cough or if short of breath, then go to the ER/dial 911.  Even if better, then please update [place] in about 2 days.   Take care.  Glad to see you.

## 2021-08-18 NOTE — Progress Notes (Unsigned)
H/o PNA on imaging about 1 month ago.  She improved in the meantime.    Recently with fever for 1 day.  No fevers in the meantime.  Fell twice over the weekend.  No LOC.  Cautions d/w pt.  Falls d/w pt.  [place] in garden for unclear reason.  Didn't miss at step.  Was able to get up.  No syncope.    2nd fall was in the home.  Walking from kitchen to the den and fell w/o cause.    When she falls, she feels weak and goes down.  Not focal or unilateral weakness.  She has a fall button.  She wears it.  She went to PT last year but not since.    Now with cough.  Recently restarted.  No sputum.  Can still take a deep breath.  No CP.  Back is sore from the cough.  No HA.  No vision changes.  Cough sounds wet.  Recently with hoarse voice.  Used SABA last night with relief of cough.   Recent neg covid test at home.   Meds, vitals, and allergies reviewed.   ROS: Per HPI unless specifically indicated in ROS section   Nad Ncat MMM Neck supple, no LA Coarse crackles BLL No inc wob Rrr Abd soft not ttp Extremities well perfused.  Skin without edema. Gross motor intact x4.   30 minutes were devoted to patient care in this encounter (this includes time spent reviewing the patient's file/history, interviewing and examining the patient, counseling/reviewing plan with patient).

## 2021-08-20 DIAGNOSIS — E119 Type 2 diabetes mellitus without complications: Secondary | ICD-10-CM | POA: Diagnosis not present

## 2021-08-20 NOTE — Assessment & Plan Note (Signed)
Presumed/possible aspiration pneumonia and discussed with patient about options.  I suspect the falls were due to weakness related to the primary issue.  Fall cautions discussed with patient. Still okay for outpatient follow-up at this point.  She has a fall button to use. Rest and fluids.   Use albuterol if needed, start doxycycline in the meantime.  If more cough or if short of breath, then go to the ER/dial 911.  Even if better, then I asked her to please update [place] in about 2 days.  See notes on labs and x-ray.

## 2021-08-25 ENCOUNTER — Telehealth: Payer: Self-pay | Admitting: *Deleted

## 2021-08-25 NOTE — Telephone Encounter (Signed)
PLEASE NOTE: All timestamps contained within this report are represented as [place] Standard Time. CONFIDENTIALTY NOTICE: This fax transmission is intended only for the addressee. It contains information that is legally privileged, confidential or otherwise protected from use or disclosure. If you are not the intended recipient, you are strictly prohibited from reviewing, disclosing, copying using or disseminating any of this information or taking any action in reliance on or regarding this information. If you have received this fax in error, please notify us immediately by telephone so that we can arrange for its return to [place]. Phone: [phone], Toll-Free: [phone], Fax: [phone] Page: 1 of 1 Call Id: [id number] [place] Night - Client Nonclinical Telephone Record  [facility] Client [place] [facility] - Client Client [employer] Provider [name] - MD Contact Type Call Who Is Calling Patient / Member / Family / Caregiver Caller Name [place] Phone Number [phone] Call Type Message Only Information Provided Reason for Call Returning a Call from the Office Initial [place] states she is returning a call from the office. Additional Comment Caller was provided office hours. Disp. Time Disposition Final User 08/22/2021 5:06:03 PM General Information Provided Yes D[place], [place] Call Closed By: Everlene Farrier Transaction Date/Time: 08/22/2021 5:02:29 PM (ET)

## 2021-08-25 NOTE — Telephone Encounter (Signed)
Lmtcb to discuss labs

## 2021-08-25 NOTE — Telephone Encounter (Signed)
Patient returned phone call. °

## 2021-08-25 NOTE — Telephone Encounter (Signed)
Spoke with patient about lab results. See results note.

## 2021-08-27 DIAGNOSIS — E119 Type 2 diabetes mellitus without complications: Secondary | ICD-10-CM | POA: Diagnosis not present

## 2021-09-05 ENCOUNTER — Telehealth: Payer: Self-pay

## 2021-09-05 NOTE — Chronic Care Management (AMB) (Signed)
Chronic Care Management Pharmacy Assistant   Name: Suzanne Strong  MRN: [id number] DOB: [birth year]   Reason for Encounter: Medication Adherence and  Delivery Coordination    Recent office visits:  08/18/21-Graham Duncan,MD(fam med(fam med))-cough,f/u pneumonia,labs, xrays,rest and fluids,use albuterol as needed, start doxycycline '100mg'$  twice daily,f/u couple days   Recent consult visits:  7/12/23Vicente Males Solum,MD(endo)-f/u DM, no medication changes  f/u 6 months  Hospital visits:  None in previous 6 months  Medications: Outpatient Encounter Medications as of 09/05/2021  Medication Sig   acetaminophen (TYLENOL) 325 MG tablet Take 2 tablets (650 mg total) by mouth every 6 (six) hours as needed for mild pain (or Fever >/= 101).   albuterol (VENTOLIN HFA) 108 (90 Base) MCG/ACT inhaler Inhale 2 puffs into the lungs every 6 (six) hours as needed for wheezing or shortness of breath.   ALPRAZolam (XANAX) 0.5 MG tablet TAKE ONE TABLET BY MOUTH twice daily AS NEEDED FOR ANXIETY OR SLEEP   amitriptyline (ELAVIL) 50 MG tablet Take 1 tablet (50 mg total) by mouth at bedtime.   amLODipine (NORVASC) 5 MG tablet TAKE ONE TABLET BY MOUTH ONCE DAILY   atorvastatin (LIPITOR) 10 MG tablet TAKE ONE TABLET BY MOUTH ONCE DAILY   b complex vitamins tablet Take 1 tablet by mouth daily.   BIOTIN PO Take by mouth.   busPIRone (BUSPAR) 15 MG tablet Take 1 tablet (15 mg total) by mouth 2 (two) times daily.   Cholecalciferol (VITAMIN D-1000 MAX ST) 25 MCG (1000 UT) tablet Take 2,000 Units by mouth daily. Takes three 50 mcg tablets, 3 capsules daily   doxycycline (VIBRA-TABS) 100 MG tablet Take 1 tablet (100 mg total) by mouth 2 (two) times daily.   Emollient (COLLAGEN EX)    ferrous sulfate 325 (65 FE) MG tablet Take 1 tablet (325 mg total) by mouth 2 (two) times daily with a meal.   glipiZIDE (GLUCOTROL) 5 MG tablet Take 2 tablets (10 mg total) by mouth daily before breakfast.   glucose blood (ONETOUCH  ULTRA) test strip USE TO check blood glucose ONCE DAILY AND AS NEEDED FOR DIABETES   metFORMIN (GLUCOPHAGE) 500 MG tablet Take 2 tablets (1,000 mg total) by mouth 2 (two) times daily with a meal.   Multiple Vitamins-Minerals (WOMENS MULTIVITAMIN PO) Take by mouth.   omeprazole (PRILOSEC) 20 MG capsule TAKE ONE CAPSULE BY MOUTH EVERYDAY AT BEDTIME   pregabalin (LYRICA) 150 MG capsule Take 300 mg by mouth at bedtime.   rOPINIRole (REQUIP) 1 MG tablet TAKE 1/2 TABLET BY MOUTH EVERY MORNING and TAKE THREE TABLETS BY MOUTH EVERYDAY AT BEDTIME   sertraline (ZOLOFT) 100 MG tablet Take 2 tablets (200 mg total) by mouth every morning.   Suvorexant (BELSOMRA) 20 MG TABS Take 20 mg by mouth at bedtime as needed.   traZODone (DESYREL) 50 MG tablet Take 1-2 tabs po QHS prn insomnia   zinc gluconate 50 MG tablet Take 50 mg by mouth daily.   Facility-Administered Encounter Medications as of 09/05/2021  Medication   influenza vaccine adjuvanted (FLUAD) injection 0.5 mL   sodium chloride flush (NS) 0.9 % injection 10 mL   BP Readings from Last 3 Encounters:  08/18/21 122/60  07/22/21 111/64  07/10/21 130/68    Lab Results  Component Value Date   HGBA1C 6.5 (H) 12/11/2020     Recent OV, Consult or Hospital visit:  08/18/21-(fam med)-no medication changes No medication changes indicated 08/27/21- (Endo)- no medication changes   Last adherence delivery date:08/19/21  Patient is due for next adherence delivery on: 09/11/21  Spoke with patient on 09/08/21 reviewed medications and coordinated delivery.  This delivery to include: Adherence Packaging  30 Days  Packs: Pregabalin '150mg'$ - 2 daily (2 bedtime) Omeprazole '20mg'$ - 1 tablet daily (bedtime)  Ropinirole '1mg'$  -  tablet (breakfast) and 3 tablets nightly (bedtime)  Atorvastatin '10mg'$ - 1 tablet daily (breakfast)  Amlodipine '5mg'$ - 1 tablet daily (breakfast)   Amitriptyline '50mg'$ - 1 tablet daily (bedtime)  Alpha lipoic acid '200mg'$  - 1 capsule at(  bedtime)  VIAL medications: Albuterol 108 Inhaler- inhale 2 puffs every 6 hours as needed   Patient declined the following medications this month: One touch test strips    Any concerns about your medications? No  The patient did express frustration as to not getting a refill in June on her xanax. I asked if she used another pharmacy for this medication. She only has had this filled by [facility] and never got this medication with June delivery .   How often do you forget or accidentally miss a dose? Rarely Patient reports she has missed a few with being in hospital this year.   Do you use a pillbox? No  Is patient in packaging Yes  What is the date on your next pill pack? Not close to read date on pack  Any concerns or issues with your packaging? No concerns with packs    Refills requested from providers include: pregabalin   Confirmed delivery date of 09/11/21, advised patient that pharmacy will contact them the morning of delivery.  Recent blood pressure readings are as follows:  none available   Recent blood glucose readings are as follows: none available   Annual wellness visit in last year? Yes Most Recent BP reading:118/62  94-P   08/27/21  If Diabetic: Most recent A1C reading:7.1 Last eye exam / retinopathy screening:UTD Last diabetic foot exam:2019  Cycle dispensing form sent to [facility], CPP for review.  Charlene Brooke, CPP notified  Avel Sensor, [place]  [phone]

## 2021-09-09 ENCOUNTER — Other Ambulatory Visit: Payer: Self-pay | Admitting: Family Medicine

## 2021-09-09 MED ORDER — ROPINIROLE HCL 1 MG PO TABS: ORAL_TABLET | ORAL | 3 refills | Status: DC

## 2021-09-09 NOTE — Addendum Note (Signed)
Addended by: Charlton Haws on: 09/09/2021 02:45 PM   Modules accepted: Orders

## 2021-09-09 NOTE — Addendum Note (Signed)
Addended by: [name] A on: 09/09/2021 04:47 PM   Modules accepted: Orders

## 2021-09-09 NOTE — Telephone Encounter (Addendum)
Patient needs refill for ropinirole 1 mg - 1/2 tab AM and 3 tab HS. Pharmacy: [facility]  Routing to PCP refill.

## 2021-09-09 NOTE — Telephone Encounter (Signed)
Is this okay to fill? 

## 2021-09-10 ENCOUNTER — Other Ambulatory Visit: Payer: Self-pay | Admitting: Family Medicine

## 2021-09-10 ENCOUNTER — Telehealth: Payer: Self-pay | Admitting: Family Medicine

## 2021-09-10 MED ORDER — OMEPRAZOLE 20 MG PO CPDR: DELAYED_RELEASE_CAPSULE | ORAL | 3 refills | Status: DC

## 2021-09-10 NOTE — Telephone Encounter (Signed)
I sent it  

## 2021-09-10 NOTE — Telephone Encounter (Signed)
Pharmacy requesting to refill Prilosec. Need to have package today for delivery to pt tomorrow.

## 2021-09-15 ENCOUNTER — Encounter: Payer: Self-pay | Admitting: Family Medicine

## 2021-09-16 ENCOUNTER — Encounter: Payer: Self-pay | Admitting: Family Medicine

## 2021-09-16 MED ORDER — DOXYCYCLINE HYCLATE 100 MG PO TABS: 100.0000 mg | ORAL_TABLET | Freq: Two times a day (BID) | ORAL | 0 refills | Status: DC

## 2021-09-16 NOTE — Telephone Encounter (Signed)
See message sent to patient.

## 2021-09-20 DIAGNOSIS — M1712 Unilateral primary osteoarthritis, left knee: Secondary | ICD-10-CM | POA: Diagnosis not present

## 2021-09-22 ENCOUNTER — Encounter: Payer: Self-pay | Admitting: Family Medicine

## 2021-09-22 ENCOUNTER — Ambulatory Visit (INDEPENDENT_AMBULATORY_CARE_PROVIDER_SITE_OTHER): Payer: PPO | Admitting: Family Medicine

## 2021-09-22 ENCOUNTER — Ambulatory Visit (INDEPENDENT_AMBULATORY_CARE_PROVIDER_SITE_OTHER)
Admission: RE | Admit: 2021-09-22 | Discharge: 2021-09-22 | Disposition: A | Discharge status: Home / Self Care | Payer: PPO | Source: Ambulatory Visit | Attending: Family Medicine | Admitting: Family Medicine

## 2021-09-22 VITALS — BP 132/70 | HR 77 | Temp 98.1°F | Ht 66.0 in | Wt 209.6 lb

## 2021-09-22 DIAGNOSIS — J690 Pneumonitis due to inhalation of food and vomit: Secondary | ICD-10-CM | POA: Diagnosis not present

## 2021-09-22 DIAGNOSIS — J189 Pneumonia, unspecified organism: Secondary | ICD-10-CM | POA: Diagnosis not present

## 2021-09-22 DIAGNOSIS — Z9189 Other specified personal risk factors, not elsewhere classified: Secondary | ICD-10-CM

## 2021-09-22 DIAGNOSIS — I10 Essential (primary) hypertension: Secondary | ICD-10-CM

## 2021-09-22 DIAGNOSIS — R059 Cough, unspecified: Secondary | ICD-10-CM | POA: Diagnosis not present

## 2021-09-22 NOTE — Assessment & Plan Note (Signed)
Stable BP: 132/70   Continues  Amlodipine 5 mg daily

## 2021-09-22 NOTE — Assessment & Plan Note (Signed)
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

## 2021-09-22 NOTE — Patient Instructions (Signed)
Finish the doxycycline  Continue the albuterol inhaler as needed   Chest xray today   If cough or wheezing get worse or any fever please let us know

## 2021-09-22 NOTE — Progress Notes (Signed)
Subjective:    Patient ID: Suzanne Strong, female    DOB: 1945-10-30, 76 y.o.   MRN: [id number]  HPI Pt presents for f/u of recurrent aspiration pneumonia   Wt Readings from Last 3 Encounters:  09/22/21 209 lb 9.6 oz (95.1 kg)  08/18/21 203 lb (92.1 kg)  07/22/21 206 lb (93.4 kg)   33.83 kg/m  Had ER visit in June/ declined to stay (L sided pna on cxr) and CT showed bilat infiltrates  Cleocin and zpak were px along with ventolin and her 02 sat was low   Saw Dr Damita Dunnings on 7/3 Cxr 7/3  EXAM: CHEST - 2 VIEW   COMPARISON:  Radiograph and chest CTA 07/22/2021   FINDINGS: Improved bibasilar opacities from prior exam, there is mild background streaky basilar and posterior lower lobe scarring. No new airspace disease. The heart is normal in size with stable mediastinal contours. Aortic atherosclerosis. No pleural effusion, pneumothorax, or pulmonary edema. Stable osseous structures.   IMPRESSION: Improved bibasilar opacities from prior exam, likely representing resolved pneumonia. There is mild background basilar lower lobe scarring. No new or progressive airspace disease or acute findings.  Went out of town a week ago-found a snake in her house/very stressful 2 d later her bathroom ceiling fell - rough  Had to get a cortisone shot in knee   Messaged [place] with wheeze/sob  Cough and hoarseness reported on 8/1 and docycycline sent to pharm Cough is not as bad but bothers her at night primarily  Cannot get phlegm out/ hears a bit of a rattle  Hurts between shoulder blades when she coughs deep   No fever  02 sat 96% today  Some wheezing at night when she lies down only  Uses her inhaler twice daily at most (as needed)   More trouble swallowing recently-feels it on the left side She knows her limitations - avoids bread /pastries/dry things  OT recommended clearing throat if tries to swallow too big a piece   She declines a G tube    HTN bp is stable today  No cp or  palpitations or headaches or edema  No side effects to medicines  BP Readings from Last 3 Encounters:  09/22/21 132/70  08/18/21 122/60  07/22/21 111/64     Diabetes is stable  Last a1c was 7.1   Patient Active Problem List   Diagnosis Date Noted   At high risk for aspiration 04/23/2021   COVID-19 03/18/2021   Rhabdomyolysis 12/20/2020   Aspiration pneumonia ([place]) 12/09/2020   Dysuria 07/17/2020   Dysphagia    Gastric erythema    Gastric polyp    Encounter for screening colonoscopy    Polyp of colon    History of aspiration pneumonia 02/15/2020   Diabetes mellitus without complication ([place])    GERD (gastroesophageal reflux disease)    Hypertension    Urinary urgency 01/05/2020   Grief reaction 09/18/2019   Swallowing dysfunction 07/18/2019   Cough 02/21/2019   Fever, intermittent 09/01/2018   Pre-syncope 02/07/2018   Orthostatic hypotension 01/26/2018   Episodic weakness 01/26/2018   Medicare annual wellness visit, subsequent 06/29/2017   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   Preoperative examination 04/27/2017   Carpal tunnel syndrome of right wrist 11/13/2016   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  02/17/2016   Obstructive sleep apnea 01/01/2016   Carcinoma of upper-outer quadrant of left breast in female, estrogen receptor negative ([place]) 11/21/2015   Hypomagnesemia 08/28/2015   Fever 04/11/2015   Initial  Medicare annual wellness visit 10/16/2014   Estrogen deficiency 10/16/2014   Colon cancer screening 10/16/2014   Controlled type 2 diabetes mellitus without complication ([place]) 63/14/9702   RLS (restless legs syndrome) 02/21/2014   Chronic neck pain 07/25/2010   Depression with anxiety 07/07/2010   Routine general medical examination at a health care facility 06/26/2010   B12 deficiency 09/04/2009   Anemia, iron deficiency 08/29/2009   Anxiety state 08/26/2009   GASTROPARESIS 08/26/2009   Vitamin D deficiency 07/19/2008   Disorder of bone and  cartilage 07/19/2007   Hyperlipidemia 07/18/2007   EDEMA 07/18/2007   Skin lesion, superficial 06/02/2007   Past Medical History:  Diagnosis Date   Anemia    iron deficiency and B12 deficiency   Anxiety    Breast cancer ([place]) 02/25/2015   upper-outer quadrant of left female breast, Triple Negative. Neo-adjuvant chemo with complete pathologic response.    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   BREAST CYST ASPIRATION     BREAST EXCISIONAL BIOPSY Left 12/2015   surgery   BREAST LUMPECTOMY WITH NEEDLE LOCALIZATION Left 10/02/2015   Procedure: BREAST LUMPECTOMY WITH NEEDLE LOCALIZATION;  Surgeon: [name], MD;  Location: [facility];  Service: General;  Laterality: Left;   BREAST LUMPECTOMY WITH SENTINEL LYMPH NODE BIOPSY Left 10/02/2015   Procedure: LEFT BREAST WIDE EXCISION WITH SENTINEL LYMPH NODE BX;  Surgeon: [name], MD;  Location: [facility];  Service: General;  Laterality: Left;   BREAST SURGERY     breast biopsy benign   CARPAL TUNNEL RELEASE Right 05/19/2017   Procedure: CARPAL TUNNEL RELEASE;  Surgeon: Earnestine Leys, MD;  Location: [facility];  Service: Orthopedics;  Laterality: Right;   CATARACT EXTRACTION W/PHACO Right 10/26/2018   Procedure: CATARACT EXTRACTION PHACO AND INTRAOCULAR LENS PLACEMENT ([place]) right diabetic;  Surgeon:  Leandrew Koyanagi, MD;  Location: [facility];  Service: Ophthalmology;  Laterality: Right;  Diabetic - oral meds cancer center been notified and will come   CATARACT EXTRACTION W/PHACO Left 11/16/2018   Procedure: CATARACT EXTRACTION PHACO AND INTRAOCULAR LENS PLACEMENT ([facility]) LEFT DIABETIC  01:01.0  17.0%  10.37;  Surgeon: Leandrew Koyanagi, MD;  Location: [facility];  Service: Ophthalmology;  Laterality: Left;  Diabetic - oral meds Port-a-cath   CHOLECYSTECTOMY     Cologuard  07/22/2016   Negative   COLONOSCOPY WITH PROPOFOL N/A 05/24/2020   Procedure: COLONOSCOPY WITH PROPOFOL;  Surgeon: Virgel Manifold, MD;  Location: [facility];  Service: Endoscopy;  Laterality: N/A;   ESOPHAGOGASTRODUODENOSCOPY  07/2009   normal with few gastric polyps   ESOPHAGOGASTRODUODENOSCOPY (EGD) WITH PROPOFOL N/A 05/24/2020   Procedure: ESOPHAGOGASTRODUODENOSCOPY (EGD) WITH PROPOFOL;  Surgeon: Virgel Manifold, MD;  Location: [facility];  Service: Endoscopy;  Laterality: N/A;  MOLE REMOVAL     PORT-A-CATH REMOVAL     PORTACATH PLACEMENT     PORTACATH PLACEMENT Right 03/12/2015   Procedure: INSERTION PORT-A-CATH;  Surgeon: [name], MD;  Location: [facility];  Service: General;  Laterality: Right;   RADICAL NECK DISSECTION     TONSILLECTOMY     cancer treated with chemo and radiation   TRIGGER FINGER RELEASE Right 05/19/2017   Procedure: RELEASE TRIGGER FINGER/A-1 PULLEY ring finger;  Surgeon: Earnestine Leys, MD;  Location: [facility];  Service: Orthopedics;  Laterality: Right;   Social History   Tobacco Use   Smoking status: Former    Packs/day: 0.50    Years: 6.00    Total pack years: 3.00    Types: Cigarettes    Quit date: 03/10/1984    Years since quitting: 37.5   Smokeless tobacco: Never  Vaping Use   Vaping Use: Never used  Substance Use Topics   Alcohol use: No    Alcohol/week: 0.0 standard drinks of alcohol   Drug use: No   Family History  Problem  Relation Age of Onset   Osteoporosis Mother    Hyperlipidemia Mother    Depression Mother    Cancer Mother        skin cancer ? basal cell and lung ca heavy smoker   Alcohol abuse Father    Cancer Father        skin CA ? basal cell   Heart disease Father        CHF   Hyperlipidemia Brother    Hypertension Brother    Breast cancer Neg Hx    Allergies  Allergen Reactions   Amoxicillin Swelling    REACTION: rash, swelling Has patient had a PCN reaction causing immediate rash, facial/tongue/throat swelling, SOB or lightheadedness with hypotension: yes Has patient had a PCN reaction causing severe rash involving mucus membranes or skin necrosis: no Has patient had a PCN reaction that required hospitalization: no Has patient had a PCN reaction occurring within the last 10 years: yes If all of the above answers are "NO", then may proceed with Cephalosporin use.  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     albuterol (VENTOLIN HFA) 108 (90 Base) MCG/ACT inhaler Inhale 2 puffs into the lungs every 6 (six) hours as needed for wheezing or shortness of breath. 8 g 2   ALPRAZolam (XANAX) 0.5 MG tablet TAKE ONE TABLET BY MOUTH twice daily AS NEEDED FOR ANXIETY OR SLEEP 60 tablet 2   amitriptyline (ELAVIL) 50 MG tablet Take 1 tablet (50 mg total) by mouth at bedtime. 90 tablet 3   amLODipine (NORVASC) 5 MG tablet TAKE ONE TABLET BY MOUTH ONCE DAILY 90 tablet 3   atorvastatin (LIPITOR) 10 MG tablet TAKE ONE TABLET BY MOUTH  ONCE DAILY 90 tablet 3   b complex vitamins tablet Take 1 tablet by mouth daily.     BIOTIN PO Take by mouth.  busPIRone (BUSPAR) 15 MG tablet Take 1 tablet (15 mg total) by mouth 2 (two) times daily. 180 tablet 1   Cholecalciferol (VITAMIN D-1000 MAX ST) 25 MCG (1000 UT) tablet Take 2,000 Units by mouth daily. Takes three 50 mcg tablets, 3 capsules daily     doxycycline (VIBRA-TABS) 100 MG tablet Take 1 tablet (100 mg total) by mouth 2 (two) times daily. 14 tablet 0   Emollient (COLLAGEN EX)      ferrous sulfate 325 (65 FE) MG tablet Take 1 tablet (325 mg total) by mouth 2 (two) times daily with a meal.     glipiZIDE (GLUCOTROL) 5 MG tablet Take 2 tablets (10 mg total) by mouth daily before breakfast.     glucose blood (ONETOUCH ULTRA) test strip USE TO check blood glucose ONCE DAILY AND AS NEEDED FOR DIABETES 100 strip 1   metFORMIN (GLUCOPHAGE) 500 MG tablet Take 2 tablets (1,000 mg total) by mouth 2 (two) times daily with a meal.     Multiple Vitamins-Minerals (WOMENS MULTIVITAMIN PO) Take by mouth.     omeprazole (PRILOSEC) 20 MG capsule TAKE ONE CAPSULE BY MOUTH EVERYDAY AT BEDTIME 90 capsule 3   pregabalin (LYRICA) 150 MG capsule Take 300 mg by mouth at bedtime.     rOPINIRole (REQUIP) 1 MG tablet TAKE 1/2 TABLET BY MOUTH EVERY MORNING and TAKE THREE TABLETS BY MOUTH EVERYDAY AT BEDTIME 315 tablet 3   sertraline (ZOLOFT) 100 MG tablet Take 2 tablets (200 mg total) by mouth every morning. 180 tablet 1   Suvorexant (BELSOMRA) 20 MG TABS Take 20 mg by mouth at bedtime as needed. 30 tablet 5   traZODone (DESYREL) 50 MG tablet Take 1-2 tabs po QHS prn insomnia 180 tablet 0   zinc gluconate 50 MG tablet Take 50 mg by mouth daily.     Current Facility-Administered Medications on File Prior to Visit  Medication Dose Route Frequency Provider Last Rate Last Admin   influenza vaccine adjuvanted (FLUAD) injection 0.5 mL  0.5 mL Intramuscular Once Charlaine Dalton R, MD       sodium chloride  flush (NS) 0.9 % injection 10 mL  10 mL Intravenous PRN Cammie Sickle, MD   10 mL at 09/11/15 0845     Review of Systems  Constitutional:  Positive for fatigue. Negative for activity change, appetite change, fever and unexpected weight change.  HENT:  Negative for congestion, ear pain, rhinorrhea, sinus pressure and sore throat.   Eyes:  Negative for pain, redness and visual disturbance.  Respiratory:  Positive for cough. Negative for chest tightness, shortness of breath and wheezing.   Cardiovascular:  Negative for chest pain, palpitations and leg swelling.  Gastrointestinal:  Negative for abdominal pain, blood in stool, constipation and diarrhea.  Endocrine: Negative for polydipsia and polyuria.  Genitourinary:  Negative for dysuria, frequency and urgency.  Musculoskeletal:  Negative for arthralgias, back pain and myalgias.  Skin:  Negative for pallor and rash.  Allergic/Immunologic: Negative for environmental allergies.  Neurological:  Negative for dizziness, syncope and headaches.  Hematological:  Negative for adenopathy. Does not bruise/bleed easily.  Psychiatric/Behavioral:  Negative for decreased concentration and dysphoric mood. The patient is not nervous/anxious.        Objective:   Physical Exam Constitutional:      General: She is not in acute distress.    Appearance: Normal appearance. She is well-developed. She is obese. She is not ill-appearing or diaphoretic.  HENT:     Head: Normocephalic and atraumatic.  Mouth/Throat:     Comments: Baseline dry mouth  Asymmetric pharynx -baseline  No swelling or fullness  Eyes:     General: No scleral icterus.       Right eye: No discharge.        Left eye: No discharge.     Conjunctiva/sclera: Conjunctivae normal.     Pupils: Pupils are equal, round, and reactive to light.  Neck:     Thyroid: No thyromegaly.     Vascular: No carotid bruit or JVD.     Comments: Baseline surgical changes  Cardiovascular:     Rate  and Rhythm: Normal rate and regular rhythm.     Heart sounds: Normal heart sounds.     No gallop.  Pulmonary:     Effort: Pulmonary effort is normal. No respiratory distress.     Breath sounds: Normal breath sounds. No stridor. No wheezing, rhonchi or rales.     Comments: Scant rales noted louder in L mid lung field   No wheeze Good air exch  Chest:     Chest wall: No tenderness.  Abdominal:     General: There is no distension or abdominal bruit.     Palpations: Abdomen is soft.     Tenderness: There is no abdominal tenderness. There is no guarding or rebound.  Musculoskeletal:     Cervical back: Normal range of motion and neck supple. No tenderness.     Right lower leg: No edema.     Left lower leg: No edema.  Lymphadenopathy:     Cervical: No cervical adenopathy.  Skin:    General: Skin is warm and dry.     Coloration: Skin is not pale.     Findings: No rash.  Neurological:     Mental Status: She is alert.     Coordination: Coordination normal.     Deep Tendon Reflexes: Reflexes are normal and symmetric. Reflexes normal.  Psychiatric:        Mood and Affect: Mood normal.           Assessment & Plan:   Problem List Items Addressed This Visit       Cardiovascular and Mediastinum   Hypertension    Stable BP: 132/70   Continues  Amlodipine 5 mg daily         Respiratory   Aspiration pneumonia (HCC) - Primary    Recurrent (due to inadequate swallowing from prior radiation tx)  This episode is much clinically improved with course of doxycycline Reassuring exam inst to finish course cxr ordered Will watch for fever or sob   Pt declines G tube at this time       Relevant Orders   DG Chest 2 View (Completed)     Other   At high risk for aspiration    High risk due to abnormal swallow  H/o past radiation for tonsillar cancer in the past   She is careful  Uses tricks taught by speech therapy  Only way to reassure no aspiration is G tube, she declines  this

## 2021-09-22 NOTE — Assessment & Plan Note (Signed)
High risk due to abnormal swallow  H/o past radiation for tonsillar cancer in the past   She is careful  Uses tricks taught by speech therapy  Only way to reassure no aspiration is G tube, she declines this

## 2021-09-23 ENCOUNTER — Telehealth: Payer: Self-pay

## 2021-09-23 NOTE — Telephone Encounter (Signed)
-----   Message from [name], MD sent at 09/22/2021  7:43 PM EDT ----- Some scarring in left lung is noted (this is unchanged and I suspect your baseline) No new findings  This is reassuring  Let us know if symptoms worsen or do not continue to improve

## 2021-09-23 NOTE — Telephone Encounter (Signed)
I left a message for the patient to return my call.

## 2021-09-29 ENCOUNTER — Telehealth: Payer: Self-pay

## 2021-09-29 NOTE — Chronic Care Management (AMB) (Unsigned)
Chronic Care Management Pharmacy Assistant   Name: Suzanne Strong  MRN: [id number] DOB: [birth year]   Reason for Encounter: Medication Adherence and Delivery Coordination   Recent office visits:  09/22/21-Suzanne Tower,MD(PCP)-f/u pneumonia,finish course of doxycycline.If cough or wheezing get worse or any fever please let us know    Recent consult visits:  None since last CCM contact   Hospital visits:  None in previous 6 months  Medications: Outpatient Encounter Medications as of 09/29/2021  Medication Sig   acetaminophen (TYLENOL) 325 MG tablet Take 2 tablets (650 mg total) by mouth every 6 (six) hours as needed for mild pain (or Fever >/= 101).   albuterol (VENTOLIN HFA) 108 (90 Base) MCG/ACT inhaler Inhale 2 puffs into the lungs every 6 (six) hours as needed for wheezing or shortness of breath.   ALPRAZolam (XANAX) 0.5 MG tablet TAKE ONE TABLET BY MOUTH twice daily AS NEEDED FOR ANXIETY OR SLEEP   amitriptyline (ELAVIL) 50 MG tablet Take 1 tablet (50 mg total) by mouth at bedtime.   amLODipine (NORVASC) 5 MG tablet TAKE ONE TABLET BY MOUTH ONCE DAILY   atorvastatin (LIPITOR) 10 MG tablet TAKE ONE TABLET BY MOUTH ONCE DAILY   b complex vitamins tablet Take 1 tablet by mouth daily.   BIOTIN PO Take by mouth.   busPIRone (BUSPAR) 15 MG tablet Take 1 tablet (15 mg total) by mouth 2 (two) times daily.   Cholecalciferol (VITAMIN D-1000 MAX ST) 25 MCG (1000 UT) tablet Take 2,000 Units by mouth daily. Takes three 50 mcg tablets, 3 capsules daily   doxycycline (VIBRA-TABS) 100 MG tablet Take 1 tablet (100 mg total) by mouth 2 (two) times daily.   Emollient (COLLAGEN EX)    ferrous sulfate 325 (65 FE) MG tablet Take 1 tablet (325 mg total) by mouth 2 (two) times daily with a meal.   glipiZIDE (GLUCOTROL) 5 MG tablet Take 2 tablets (10 mg total) by mouth daily before breakfast.   glucose blood (ONETOUCH ULTRA) test strip USE TO check blood glucose ONCE DAILY AND AS NEEDED FOR DIABETES    metFORMIN (GLUCOPHAGE) 500 MG tablet Take 2 tablets (1,000 mg total) by mouth 2 (two) times daily with a meal.   Multiple Vitamins-Minerals (WOMENS MULTIVITAMIN PO) Take by mouth.   omeprazole (PRILOSEC) 20 MG capsule TAKE ONE CAPSULE BY MOUTH EVERYDAY AT BEDTIME   pregabalin (LYRICA) 150 MG capsule Take 300 mg by mouth at bedtime.   rOPINIRole (REQUIP) 1 MG tablet TAKE 1/2 TABLET BY MOUTH EVERY MORNING and TAKE THREE TABLETS BY MOUTH EVERYDAY AT BEDTIME   sertraline (ZOLOFT) 100 MG tablet Take 2 tablets (200 mg total) by mouth every morning.   Suvorexant (BELSOMRA) 20 MG TABS Take 20 mg by mouth at bedtime as needed.   traZODone (DESYREL) 50 MG tablet Take 1-2 tabs po QHS prn insomnia   zinc gluconate 50 MG tablet Take 50 mg by mouth daily.   Facility-Administered Encounter Medications as of 09/29/2021  Medication   influenza vaccine adjuvanted (FLUAD) injection 0.5 mL   sodium chloride flush (NS) 0.9 % injection 10 mL   BP Readings from Last 3 Encounters:  09/22/21 132/70  08/18/21 122/60  07/22/21 111/64    Lab Results  Component Value Date   HGBA1C 6.5 (H) 12/11/2020      Recent OV, Consult or Hospital visit: 09/22/21-Suzanne Tower,MD(PCP)-f/u pneumonia,finish course of doxycycline.If cough or wheezing get worse or any fever please let us know    No medication changes indicated  Last adherence delivery date:09/11/21      Patient is due for next adherence delivery on: 10/09/21  Multiple attempts made to reach patient. Unsuccessful outreach. Will refill based off of last adherence fill.   This delivery to include: Adherence Packaging  30 Days  Packs: Pregabalin '150mg'$ - 2 daily (2 bedtime) Omeprazole '20mg'$ - 1 tablet daily (bedtime)  Ropinirole '1mg'$  -  tablet (breakfast) and 3 tablets nightly (bedtime)  Atorvastatin '10mg'$ - 1 tablet daily (breakfast)  Amlodipine '5mg'$ - 1 tablet daily (breakfast)   Amitriptyline '50mg'$ - 1 tablet daily (bedtime)  Alpha lipoic acid '200mg'$  - 1 capsule  at( bedtime)  VIAL medications: Albuterol 108 Inhaler- inhale 2 puffs every 6 hours as needed Alprazolam 0.'5mg'$ - take 1 tablet twice daily as needed  Patient declined the following medications this month: One touch test strips  Sertraline '100mg'$ - not due yet     No refill request needed.  Delivery scheduled for 10/09/21. Unable to speak with patient to confirm date.   Recent blood pressure readings are as follows: none available    Recent blood glucose readings are as follows: none available     Cycle dispensing form sent to Suzanne Strong, PTM for review.   Suzanne Strong, CPP notified  Suzanne Strong, [place]  [phone]

## 2021-10-10 ENCOUNTER — Telehealth: Payer: PPO

## 2021-10-21 ENCOUNTER — Encounter: Payer: Self-pay | Admitting: Family Medicine

## 2021-10-21 ENCOUNTER — Ambulatory Visit (INDEPENDENT_AMBULATORY_CARE_PROVIDER_SITE_OTHER): Payer: PPO | Admitting: Family Medicine

## 2021-10-21 DIAGNOSIS — L255 Unspecified contact dermatitis due to plants, except food: Secondary | ICD-10-CM

## 2021-10-21 MED ORDER — PREDNISONE 10 MG PO TABS: ORAL_TABLET | ORAL | 0 refills | Status: DC

## 2021-10-21 NOTE — Patient Instructions (Signed)
Clean any clothing or tools that you worked outside in   Lane someone to get the poison plan out of your yard   Take the prednisone as directed  Use cool compress for itching   Hot conditions make itching worse   Zyrtec 10 mg once daily over the counter is easier than benadryl  It is over the counter   Update if not starting to improve in a week or if worsening

## 2021-10-21 NOTE — Progress Notes (Signed)
Subjective:    Patient ID: Suzanne Strong, female    DOB: [birth year], 76 y.o.   MRN: [id number]  HPI Pt presents with poison ivy dermatitis  Wt Readings from Last 3 Encounters:  10/21/21 196 lb 6 oz (89.1 kg)  09/22/21 209 lb 9.6 oz (95.1 kg)  08/18/21 203 lb (92.1 kg)   31.70 kg/m   Has been working outside - a week ago   This started last Tuesday- thought it was mosquito bites on arms  Then more of a rash  Keeps breaking out   Mostly her arms Some on her lower legs/ankles   Eyes are itchy too - water more   Patient Active Problem List   Diagnosis Date Noted   Plant dermatitis 10/21/2021   At high risk for aspiration 04/23/2021   COVID-19 03/18/2021   Aspiration pneumonia ([place]) 12/09/2020   Dysuria 07/17/2020   Dysphagia    Gastric erythema    Gastric polyp    Encounter for screening colonoscopy    Polyp of colon    History of aspiration pneumonia 02/15/2020   Diabetes mellitus without complication ([place])    GERD (gastroesophageal reflux disease)    Hypertension    Urinary urgency 01/05/2020   Grief reaction 09/18/2019   Swallowing dysfunction 07/18/2019   Cough 02/21/2019   Fever, intermittent 09/01/2018   Pre-syncope 02/07/2018   Orthostatic hypotension 01/26/2018   Episodic weakness 01/26/2018   Medicare annual wellness visit, subsequent 06/29/2017   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   Preoperative examination 04/27/2017   Carpal tunnel syndrome of right wrist 11/13/2016   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  02/17/2016   Obstructive sleep apnea 01/01/2016   Carcinoma of upper-outer quadrant of left breast in female, estrogen receptor negative ([place]) 11/21/2015   Hypomagnesemia 08/28/2015   Fever 04/11/2015   Initial Medicare annual wellness visit 10/16/2014   Estrogen deficiency 10/16/2014   Colon cancer screening 10/16/2014   Controlled type 2 diabetes mellitus without complication ([place]) 01/75/1025   RLS (restless legs syndrome)  02/21/2014   Chronic neck pain 07/25/2010   Depression with anxiety 07/07/2010   Routine general medical examination at a health care facility 06/26/2010   B12 deficiency 09/04/2009   Anemia, iron deficiency 08/29/2009   Anxiety state 08/26/2009   GASTROPARESIS 08/26/2009   Vitamin D deficiency 07/19/2008   Disorder of bone and cartilage 07/19/2007   Hyperlipidemia 07/18/2007   EDEMA 07/18/2007   Past Medical History:  Diagnosis Date   Anemia    iron deficiency and B12 deficiency   Anxiety    Breast cancer ([place]) 02/25/2015   upper-outer quadrant of left female breast, Triple Negative. Neo-adjuvant chemo with complete pathologic response.    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  BREAST CYST ASPIRATION     BREAST EXCISIONAL BIOPSY Left 12/2015   surgery   BREAST LUMPECTOMY WITH NEEDLE LOCALIZATION Left 10/02/2015   Procedure: BREAST LUMPECTOMY WITH NEEDLE LOCALIZATION;  Surgeon: [name], MD;  Location: [facility];  Service: General;  Laterality: Left;   BREAST LUMPECTOMY WITH SENTINEL LYMPH NODE BIOPSY Left 10/02/2015   Procedure: LEFT BREAST WIDE EXCISION WITH SENTINEL LYMPH NODE BX;  Surgeon: [name], MD;  Location: [facility];  Service: General;  Laterality: Left;   BREAST SURGERY     breast biopsy benign   CARPAL TUNNEL RELEASE  Right 05/19/2017   Procedure: CARPAL TUNNEL RELEASE;  Surgeon: Earnestine Leys, MD;  Location: [facility];  Service: Orthopedics;  Laterality: Right;   CATARACT EXTRACTION W/PHACO Right 10/26/2018   Procedure: CATARACT EXTRACTION PHACO AND INTRAOCULAR LENS PLACEMENT ([place]) right diabetic;  Surgeon: Leandrew Koyanagi, MD;  Location: [facility];  Service: Ophthalmology;  Laterality: Right;  Diabetic - oral meds cancer center been notified and will come   CATARACT EXTRACTION W/PHACO Left 11/16/2018   Procedure: CATARACT EXTRACTION PHACO AND INTRAOCULAR LENS PLACEMENT ([facility]) LEFT DIABETIC  01:01.0  17.0%  10.37;  Surgeon: Leandrew Koyanagi, MD;  Location: [facility];  Service: Ophthalmology;  Laterality: Left;  Diabetic - oral meds Port-a-cath   CHOLECYSTECTOMY     Cologuard  07/22/2016   Negative   COLONOSCOPY WITH PROPOFOL N/A 05/24/2020   Procedure: COLONOSCOPY WITH PROPOFOL;  Surgeon: Virgel Manifold, MD;  Location: [facility];  Service: Endoscopy;  Laterality: N/A;   ESOPHAGOGASTRODUODENOSCOPY  07/2009   normal with few gastric polyps   ESOPHAGOGASTRODUODENOSCOPY (EGD) WITH PROPOFOL N/A 05/24/2020   Procedure: ESOPHAGOGASTRODUODENOSCOPY (EGD) WITH PROPOFOL;  Surgeon: Virgel Manifold, MD;  Location: [facility];  Service: Endoscopy;  Laterality: N/A;   MOLE REMOVAL     PORT-A-CATH REMOVAL     PORTACATH PLACEMENT     PORTACATH PLACEMENT Right 03/12/2015   Procedure: INSERTION PORT-A-CATH;  Surgeon: [name], MD;  Location: [facility];  Service: General;  Laterality: Right;   RADICAL NECK DISSECTION     TONSILLECTOMY     cancer treated with chemo and radiation   TRIGGER FINGER RELEASE Right 05/19/2017   Procedure: RELEASE TRIGGER FINGER/A-1 PULLEY ring finger;  Surgeon: Earnestine Leys, MD;  Location: [facility];  Service: Orthopedics;  Laterality: Right;   Social History   Tobacco Use   Smoking status: Former    Packs/day: 0.50    Years: 6.00    Total pack  years: 3.00    Types: Cigarettes    Quit date: 03/10/1984    Years since quitting: 37.6   Smokeless tobacco: Never  Vaping Use   Vaping Use: Never used  Substance Use Topics   Alcohol use: No    Alcohol/week: 0.0 standard drinks of alcohol   Drug use: No   Family History  Problem Relation Age of Onset   Osteoporosis Mother    Hyperlipidemia Mother    Depression Mother    Cancer Mother        skin cancer ? basal cell and lung ca heavy smoker   Alcohol abuse Father    Cancer Father        skin CA ? basal cell   Heart disease Father        CHF   Hyperlipidemia Brother    Hypertension Brother    Breast cancer Neg Hx    Allergies  Allergen Reactions   Amoxicillin Swelling  REACTION: rash, swelling Has patient had a PCN reaction causing immediate rash, facial/tongue/throat swelling, SOB or lightheadedness with hypotension: yes Has patient had a PCN reaction causing severe rash involving mucus membranes or skin necrosis: no Has patient had a PCN reaction that required hospitalization: no Has patient had a PCN reaction occurring within the last 10 years: yes If all of the above answers are "NO", then may proceed with Cephalosporin use.  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     albuterol (VENTOLIN HFA) 108 (90 Base) MCG/ACT inhaler Inhale 2 puffs into the lungs every 6 (six) hours as needed for wheezing or shortness of breath. 8 g 2   ALPRAZolam (XANAX) 0.5 MG tablet TAKE ONE TABLET BY MOUTH twice  daily AS NEEDED FOR ANXIETY OR SLEEP 60 tablet 2   amitriptyline (ELAVIL) 50 MG tablet Take 1 tablet (50 mg total) by mouth at bedtime. 90 tablet 3   amLODipine (NORVASC) 5 MG tablet TAKE ONE TABLET BY MOUTH ONCE DAILY 90 tablet 3   atorvastatin (LIPITOR) 10 MG tablet TAKE ONE TABLET BY MOUTH ONCE DAILY 90 tablet 3   b complex vitamins tablet Take 1 tablet by mouth daily.     BIOTIN PO Take by mouth.     busPIRone (BUSPAR) 15 MG tablet Take 1 tablet (15 mg total) by mouth 2 (two) times daily. 180 tablet 1   Cholecalciferol (VITAMIN D-1000 MAX ST) 25 MCG (1000 UT) tablet Take 2,000 Units by mouth daily. Takes three 50 mcg tablets, 3 capsules daily     Emollient (COLLAGEN EX)      ferrous sulfate 325 (65 FE) MG tablet Take 1 tablet (325 mg total) by mouth 2 (two) times daily with a meal.     glipiZIDE (GLUCOTROL) 5 MG tablet Take 2 tablets (10 mg total) by mouth daily before breakfast.     glucose blood (ONETOUCH ULTRA) test strip USE TO check blood glucose ONCE DAILY AND AS NEEDED FOR DIABETES 100 strip 1   metFORMIN (GLUCOPHAGE) 500 MG tablet Take 2 tablets (1,000 mg total) by mouth 2 (two) times daily with a meal.     Multiple Vitamins-Minerals (WOMENS MULTIVITAMIN PO) Take by mouth.     omeprazole (PRILOSEC) 20 MG capsule TAKE ONE CAPSULE BY MOUTH EVERYDAY AT BEDTIME 90 capsule 3   pregabalin (LYRICA) 150 MG capsule Take 300 mg by mouth at bedtime.     rOPINIRole (REQUIP) 1 MG tablet TAKE 1/2 TABLET BY MOUTH EVERY MORNING and TAKE THREE TABLETS BY MOUTH EVERYDAY AT BEDTIME 315 tablet 3   sertraline (ZOLOFT) 100 MG tablet Take 2 tablets (200 mg total) by mouth every morning. 180 tablet 1   Suvorexant (BELSOMRA) 20 MG TABS Take 20 mg by mouth at bedtime as needed. 30 tablet 5   traZODone (DESYREL) 50 MG tablet Take 1-2 tabs po QHS prn insomnia 180 tablet 0   zinc gluconate 50 MG tablet Take 50 mg by mouth daily.  Current Facility-Administered Medications on File Prior to Visit  Medication  Dose Route Frequency Provider Last Rate Last Admin   influenza vaccine adjuvanted (FLUAD) injection 0.5 mL  0.5 mL Intramuscular Once Charlaine Dalton R, MD       sodium chloride flush (NS) 0.9 % injection 10 mL  10 mL Intravenous PRN Cammie Sickle, MD   10 mL at 09/11/15 0845    Review of Systems  Constitutional:  Negative for activity change, appetite change, fatigue, fever and unexpected weight change.  HENT:  Negative for congestion, ear pain, rhinorrhea, sinus pressure and sore throat.   Eyes:  Negative for pain, redness and visual disturbance.  Respiratory:  Negative for cough, shortness of breath and wheezing.   Cardiovascular:  Negative for chest pain and palpitations.  Gastrointestinal:  Negative for abdominal pain, blood in stool, constipation and diarrhea.  Endocrine: Negative for polydipsia and polyuria.  Genitourinary:  Negative for dysuria, frequency and urgency.  Musculoskeletal:  Negative for arthralgias, back pain and myalgias.  Skin:  Positive for rash. Negative for pallor.  Allergic/Immunologic: Negative for environmental allergies.  Neurological:  Negative for dizziness, syncope and headaches.  Hematological:  Negative for adenopathy. Does not bruise/bleed easily.  Psychiatric/Behavioral:  Negative for decreased concentration and dysphoric mood. The patient is not nervous/anxious.        Objective:   Physical Exam Constitutional:      General: She is not in acute distress.    Appearance: Normal appearance. She is obese. She is not ill-appearing or diaphoretic.  Eyes:     General:        Right eye: No discharge.        Left eye: No discharge.     Conjunctiva/sclera: Conjunctivae normal.     Pupils: Pupils are equal, round, and reactive to light.  Cardiovascular:     Rate and Rhythm: Normal rate and regular rhythm.     Heart sounds: Normal heart sounds.  Pulmonary:     Effort: Pulmonary effort is normal. No respiratory distress.     Breath sounds:  Normal breath sounds. No stridor. No wheezing, rhonchi or rales.  Musculoskeletal:     Cervical back: Neck supple.  Lymphadenopathy:     Cervical: No cervical adenopathy.  Skin:    General: Skin is warm and dry.     Findings: Rash present.     Comments: Patches of vesicular rash (some in linear pattern) on lower arms/wrists and R ankle Few clean appearing excoriations No swellng     Neurological:     Mental Status: She is alert.     Cranial Nerves: No cranial nerve deficit.  Psychiatric:        Mood and Affect: Mood normal.           Assessment & Plan:   Problem List Items Addressed This Visit       Musculoskeletal and Integument   Plant dermatitis    Patchy vesicular rash in linear pattern on arms /wrists and R ankle  Not resp to otc tx   Px prednisone 40 mg taper  (disc side eff) Recommend antihistamine like zyrtec daily prn Keep cool Wash any materials/equip that touched plant outside  Update if not starting to improve in a week or if worsening

## 2021-10-21 NOTE — Assessment & Plan Note (Signed)
Patchy vesicular rash in linear pattern on arms /wrists and R ankle  Not resp to otc tx   Px prednisone 40 mg taper  (disc side eff) Recommend antihistamine like zyrtec daily prn Keep cool Wash any materials/equip that touched plant outside  Update if not starting to improve in a week or if worsening

## 2021-10-28 ENCOUNTER — Telehealth: Payer: Self-pay

## 2021-10-28 NOTE — Chronic Care Management (AMB) (Signed)
Chronic Care Management Pharmacy Assistant   Name: Suzanne Strong  MRN: [id number] DOB: 31-Aug-1945   Reason for Encounter: Medication Adherence and Delivery Coordination   Recent office visits:  10/21/21-Marne Tower,MD(PCP)-poison ivy,start prednisone '10mg'$  taper dose,Recommend antihistamine like zyrtec daily prn   Recent consult visits:  None since last CCM contact   Hospital visits:  None in previous 6 months  Medications: Outpatient Encounter Medications as of 10/28/2021  Medication Sig   acetaminophen (TYLENOL) 325 MG tablet Take 2 tablets (650 mg total) by mouth every 6 (six) hours as needed for mild pain (or Fever >/= 101).   albuterol (VENTOLIN HFA) 108 (90 Base) MCG/ACT inhaler Inhale 2 puffs into the lungs every 6 (six) hours as needed for wheezing or shortness of breath.   ALPRAZolam (XANAX) 0.5 MG tablet TAKE ONE TABLET BY MOUTH twice daily AS NEEDED FOR ANXIETY OR SLEEP   amitriptyline (ELAVIL) 50 MG tablet Take 1 tablet (50 mg total) by mouth at bedtime.   amLODipine (NORVASC) 5 MG tablet TAKE ONE TABLET BY MOUTH ONCE DAILY   atorvastatin (LIPITOR) 10 MG tablet TAKE ONE TABLET BY MOUTH ONCE DAILY   b complex vitamins tablet Take 1 tablet by mouth daily.   BIOTIN PO Take by mouth.   busPIRone (BUSPAR) 15 MG tablet Take 1 tablet (15 mg total) by mouth 2 (two) times daily.   Cholecalciferol (VITAMIN D-1000 MAX ST) 25 MCG (1000 UT) tablet Take 2,000 Units by mouth daily. Takes three 50 mcg tablets, 3 capsules daily   Emollient (COLLAGEN EX)    ferrous sulfate 325 (65 FE) MG tablet Take 1 tablet (325 mg total) by mouth 2 (two) times daily with a meal.   glipiZIDE (GLUCOTROL) 5 MG tablet Take 2 tablets (10 mg total) by mouth daily before breakfast.   glucose blood (ONETOUCH ULTRA) test strip USE TO check blood glucose ONCE DAILY AND AS NEEDED FOR DIABETES   metFORMIN (GLUCOPHAGE) 500 MG tablet Take 2 tablets (1,000 mg total) by mouth 2 (two) times daily with a meal.    Multiple Vitamins-Minerals (WOMENS MULTIVITAMIN PO) Take by mouth.   omeprazole (PRILOSEC) 20 MG capsule TAKE ONE CAPSULE BY MOUTH EVERYDAY AT BEDTIME   predniSONE (DELTASONE) 10 MG tablet Take 4 pills once daily by mouth for 3 days, then 3 pills daily for 3 days, then 2 pills daily for 3 days then 1 pill daily for 3 days then stop   pregabalin (LYRICA) 150 MG capsule Take 300 mg by mouth at bedtime.   rOPINIRole (REQUIP) 1 MG tablet TAKE 1/2 TABLET BY MOUTH EVERY MORNING and TAKE THREE TABLETS BY MOUTH EVERYDAY AT BEDTIME   sertraline (ZOLOFT) 100 MG tablet Take 2 tablets (200 mg total) by mouth every morning.   Suvorexant (BELSOMRA) 20 MG TABS Take 20 mg by mouth at bedtime as needed.   traZODone (DESYREL) 50 MG tablet Take 1-2 tabs po QHS prn insomnia   zinc gluconate 50 MG tablet Take 50 mg by mouth daily.   Facility-Administered Encounter Medications as of 10/28/2021  Medication   influenza vaccine adjuvanted (FLUAD) injection 0.5 mL   sodium chloride flush (NS) 0.9 % injection 10 mL   BP Readings from Last 3 Encounters:  10/21/21 (!) 142/70  09/22/21 132/70  08/18/21 122/60    Lab Results  Component Value Date   HGBA1C 6.5 (H) 12/11/2020     Recent OV, Consult or Hospital visit:   10/21/21-PCP -plant dermatitis,prednisone taper dose given.  Last adherence  delivery date:10/09/21      Patient is due for next adherence delivery on: 11/07/21  Spoke with patient on 10/29/21 reviewed medications and coordinated delivery.  This delivery to include: Adherence Packaging  30 Days  Packs: Pregabalin '150mg'$ - 2 daily (2 bedtime) Omeprazole '20mg'$ - 1 tablet daily (bedtime)  Ropinirole '1mg'$  -  tablet (breakfast) and 3 tablets nightly (bedtime)  Atorvastatin '10mg'$ - 1 tablet daily (breakfast)  Amlodipine '5mg'$ - 1 tablet daily (breakfast)   Amitriptyline '50mg'$ - 1 tablet daily (bedtime)  Alpha lipoic acid '200mg'$  - 1 capsule at( bedtime) Sertraline '100mg'$ -take 2 tablets at (breakfast )  VIAL  medications: Alprazolam 0.'5mg'$ - take 1 tablet twice daily as needed   Patient declined the following medications this month: Albuterol 108 Inhaler- inhale 2 puffs every 6 hours as needed- has supply on hand    Any concerns about your medications? No  How often do you forget or accidentally miss a dose? Never  Do you use a pillbox? No  Is patient in packaging Yes  What is the date on your next pill pack? Patient has vertigo and unable to stand up to read date   Any concerns or issues with your packaging? satisfied   Refills requested from providers include:amitriptyline,alprazolam   Confirmed delivery date of 11/07/21, advised patient that pharmacy will contact them the morning of delivery.  Recent blood pressure readings are as follows:none available    Recent blood glucose readings are as follows:none available    Cycle dispensing form sent to Margaretmary Dys, PTM for review.  Charlene Brooke, CPP notified  Avel Sensor, [place]  [phone]

## 2021-10-29 ENCOUNTER — Other Ambulatory Visit: Payer: Self-pay | Admitting: Psychiatry

## 2021-10-29 DIAGNOSIS — F411 Generalized anxiety disorder: Secondary | ICD-10-CM

## 2021-10-30 ENCOUNTER — Emergency Department
Admission: EM | Admit: 2021-10-30 | Discharge: 2021-10-30 | Discharge status: Against Medical Advice | Payer: PPO | Attending: Emergency Medicine | Admitting: Emergency Medicine

## 2021-10-30 ENCOUNTER — Emergency Department: Payer: PPO

## 2021-10-30 ENCOUNTER — Telehealth: Payer: Self-pay | Admitting: Family Medicine

## 2021-10-30 ENCOUNTER — Other Ambulatory Visit: Payer: Self-pay

## 2021-10-30 DIAGNOSIS — Z5321 Procedure and treatment not carried out due to patient leaving prior to being seen by health care provider: Secondary | ICD-10-CM | POA: Diagnosis not present

## 2021-10-30 DIAGNOSIS — R42 Dizziness and giddiness: Secondary | ICD-10-CM | POA: Diagnosis not present

## 2021-10-30 DIAGNOSIS — R531 Weakness: Secondary | ICD-10-CM | POA: Diagnosis not present

## 2021-10-30 LAB — COMPREHENSIVE METABOLIC PANEL
ALT: 22 U/L (ref 0–44)
AST: 25 U/L (ref 15–41)
Albumin: 3.5 g/dL (ref 3.5–5.0)
Alkaline Phosphatase: 69 U/L (ref 38–126)
Anion gap: 10 (ref 5–15)
BUN: 18 mg/dL (ref 8–23)
CO2: 24 mmol/L (ref 22–32)
Calcium: 8.5 mg/dL — ABNORMAL LOW (ref 8.9–10.3)
Chloride: 101 mmol/L (ref 98–111)
Creatinine, Ser: 0.92 mg/dL (ref 0.44–1.00)
GFR, Estimated: >60 mL/min (ref >60)
Glucose, Bld: 148 mg/dL — ABNORMAL HIGH (ref 70–99)
Potassium: 3 mmol/L — ABNORMAL LOW (ref 3.5–5.1)
Sodium: 135 mmol/L (ref 135–145)
Total Bilirubin: 0.5 mg/dL (ref 0.3–1.2)
Total Protein: 7.4 g/dL (ref 6.5–8.1)

## 2021-10-30 LAB — URINALYSIS, ROUTINE W REFLEX MICROSCOPIC
Bacteria, UA: NONE SEEN
Bilirubin Urine: NEGATIVE
Glucose, UA: NEGATIVE mg/dL
Hgb urine dipstick: NEGATIVE
Ketones, ur: NEGATIVE mg/dL
Nitrite: NEGATIVE
Protein, ur: 30 mg/dL — AB
Specific Gravity, Urine: 1.032 — ABNORMAL HIGH (ref 1.005–1.030)
pH: 5 (ref 5.0–8.0)

## 2021-10-30 LAB — CBC WITH DIFFERENTIAL/PLATELET
Abs Immature Granulocytes: 0.05 10*3/uL (ref 0.00–0.07)
Basophils Absolute: 0 10*3/uL (ref 0.0–0.1)
Basophils Relative: 0 %
Eosinophils Absolute: 0.1 10*3/uL (ref 0.0–0.5)
Eosinophils Relative: 1 %
HCT: 36.2 % (ref 36.0–46.0)
Hemoglobin: 11.2 g/dL — ABNORMAL LOW (ref 12.0–15.0)
Immature Granulocytes: 0 %
Lymphocytes Relative: 10 %
Lymphs Abs: 1.1 10*3/uL (ref 0.7–4.0)
MCH: 26.7 pg (ref 26.0–34.0)
MCHC: 30.9 g/dL (ref 30.0–36.0)
MCV: 86.4 fL (ref 80.0–100.0)
Monocytes Absolute: 0.8 10*3/uL (ref 0.1–1.0)
Monocytes Relative: 7 %
Neutro Abs: 9.7 10*3/uL — ABNORMAL HIGH (ref 1.7–7.7)
Neutrophils Relative %: 82 %
Platelets: 239 10*3/uL (ref 150–400)
RBC: 4.19 MIL/uL (ref 3.87–5.11)
RDW: 14.6 % (ref 11.5–15.5)
WBC: 11.9 10*3/uL — ABNORMAL HIGH (ref 4.0–10.5)
nRBC: 0 % (ref 0.0–0.2)

## 2021-10-30 LAB — TROPONIN I (HIGH SENSITIVITY): Troponin I (High Sensitivity): 8 ng/L (ref <18)

## 2021-10-30 NOTE — ED Triage Notes (Addendum)
Pt coming from home for weakness and dizziness starting 2 days ago. Pt fell three weeks ago for  fall where she hit her head, pt denies loc at that time. PT denies and headache. Pt stating they feel week in their legs and are struggling to get up and down and around the house.   PT equally strong in triage. Pt alert and oriented

## 2021-10-30 NOTE — Telephone Encounter (Signed)
Pt called in stated she been having Vertigo . Did not want to schedule appointment . Would like a call back to discuss . Please advise [phone]

## 2021-10-30 NOTE — Telephone Encounter (Signed)
I spoke with pt; pt said starting on 10/28/21 when pt gets up from sitting or laying pts legs give way and pt falls. Last time pt fell was last night. Pt has been in bed today because she was afraid she would fall. Pt said she will reach out for something thinking she can touch it but object is much further away than pt thought so depth perception is off. Pt has several words that are slurred while talking with me and I asked pt if that was her normal and she said it was because she had her dentures out.pt said 2 wks ago she fell backwards outside and hit her head hard on ground; pt is not sure if she lost consciousness or not. NOw BP 161/75 P 86 and FBS this morning was 127. No CP,SOB or H/a. Pt has had dizziness today. Pt declined 911 and I spoke with Pilar Jarvis pts brother and he is at ED with mother in law but he is going to pick pt up and take to [facility] ED. Pt voiced understanding. Sending note to Dr Glori Bickers who is out of office and [place] CMA.

## 2021-10-30 NOTE — Telephone Encounter (Signed)
Please triage (PCP has no appts until next week)

## 2021-10-30 NOTE — Telephone Encounter (Signed)
Agree with ER now  She has h/o aspiration pneumonia and gets very sick  Will watch for correspondence

## 2021-11-03 ENCOUNTER — Encounter: Payer: Self-pay | Admitting: Psychiatry

## 2021-11-03 ENCOUNTER — Telehealth: Payer: Self-pay | Admitting: Psychiatry

## 2021-11-03 ENCOUNTER — Ambulatory Visit (INDEPENDENT_AMBULATORY_CARE_PROVIDER_SITE_OTHER): Payer: PPO | Admitting: Psychiatry

## 2021-11-03 DIAGNOSIS — G4700 Insomnia, unspecified: Secondary | ICD-10-CM | POA: Diagnosis not present

## 2021-11-03 DIAGNOSIS — F411 Generalized anxiety disorder: Secondary | ICD-10-CM | POA: Diagnosis not present

## 2021-11-03 MED ORDER — BUSPIRONE HCL 15 MG PO TABS: 15.0000 mg | ORAL_TABLET | Freq: Two times a day (BID) | ORAL | 1 refills | Status: DC

## 2021-11-03 MED ORDER — BELSOMRA 20 MG PO TABS: 20.0000 mg | ORAL_TABLET | Freq: Every evening | ORAL | 0 refills | Status: DC

## 2021-11-03 MED ORDER — TEMAZEPAM 15 MG PO CAPS: 15.0000 mg | ORAL_CAPSULE | Freq: Every evening | ORAL | 5 refills | Status: DC | PRN

## 2021-11-03 MED ORDER — ALPRAZOLAM 0.5 MG PO TABS: ORAL_TABLET | ORAL | 5 refills | Status: DC

## 2021-11-03 MED ORDER — TRAZODONE HCL 50 MG PO TABS: ORAL_TABLET | ORAL | 0 refills | Status: DC

## 2021-11-03 NOTE — Progress Notes (Signed)
Suzanne Strong [id number] 23-Nov-1945 76 y.o.  Subjective:   Patient ID:  Suzanne Strong is a 76 y.o. (DOB 1945/09/04) female.  Chief Complaint:  Chief Complaint  Patient presents with   Insomnia   Medication Problem    Dizziness    Insomnia   Suzanne Strong presents to the office today for follow-up of insomnia, depression, and anxiety.   She was hospitalized x 2 for aspiration pneumonia and then had community aquired pneumonia and chose not to be hospitalized. She recently fell backwards and hit her head while working in the yard after her pant got caught on a weed. She had poison ivy 3 weeks ago and had to take prednisone. She then noticed some vertigo that precipitated some falls. She reports that vertigo has improved somewhat. Today is her first day back at work.   She reports that she tends to "get a little bit depressed, which is unusual for me" when she is not able to go to work and church. She reports that her sleep "comes and goes." She repots "sometimes I have to take 2 of them instead of 3" Trazodone to be able to fall asleep. "Sometimes I have to take a Xanax with the Trazodone." She reports that she has some middle of the night and early morning awakenings. Periods of lower energy and motivation when mood is lower. She reports low moods last "2-3 days, top." She reports, "generally I am a pretty happy person." She reports that her concentration is ok. She occasionally does work from home. Denies SI.   She reports that she has used available Belsomra samples and that when she takes Belsomra she is able to sleep without difficulty. She reports that with Belsomra she falls asleep quickly and sleeps 6-8 hours without grogginess.   She reports that she has had anxiety when she is not sleeping well and has something else going on, such as a health issue. She reports that she has had some financial stressors, particularly with recently missing work.  Cost of Belsomra is $40  when she is not in the donut hole and price increases to $140 a month when she is in the donut hole.   She typically takes Xanax every night and rarely needs to take it during the day.  Husband of 38 years died 3 years ago.   Alprazolam last filled 10/09/21.  Past Psychiatric Medication Trials: Remeron Xanax Gabapentin- Unsure if this is helpful for RLS Lyrica Ropinorole- Helpful for RLS Zoloft Buspar Lyrica Trazodone-Ineffective Amitriptyline Belsomra-Effective.   Mini-Mental    Flowsheet Row Clinical Support from 07/15/2018 in [place] at [place] from 06/29/2017 in [place] at [place] from 06/25/2016 in [place] at [facility]  Total Score (max 30 points ) '17 20 20      '$ PHQ2-9    [address] Visit from 09/22/2021 in [place] at [facility] Video Visit from 07/04/2021 in [place] at [place] from 11/09/2020 in [place] at [place] from 07/15/2018 in [place] at [place] from 06/29/2017 in [place] at [facility]  PHQ-2 Total Score 0 0 0 0 0  PHQ-9 Total Score -- 2 -- 0 0      [place] [facility] from 10/30/2021 in [place] [facility] from 07/22/2021 in [place] [facility] to Hosp-Admission (Discharged) from 12/09/2020 in [place] 1C  MEDICAL TELEMETRY  C-SSRS RISK CATEGORY No Risk No Risk No Risk        Review of Systems:  Review of Systems  Eyes:  Negative for visual disturbance.  Musculoskeletal:  Negative for gait problem.  Neurological:        She reports improved dizziness  Psychiatric/Behavioral:  The patient has insomnia.        Please refer to HPI    Medications: I have reviewed the patient's current medications.  Current Outpatient Medications  Medication Sig Dispense Refill    acetaminophen (TYLENOL) 325 MG tablet Take 2 tablets (650 mg total) by mouth every 6 (six) hours as needed for mild pain (or Fever >/= 101).     albuterol (VENTOLIN HFA) 108 (90 Base) MCG/ACT inhaler Inhale 2 puffs into the lungs every 6 (six) hours as needed for wheezing or shortness of breath. 8 g 2   amitriptyline (ELAVIL) 50 MG tablet Take 1 tablet (50 mg total) by mouth at bedtime. 90 tablet 3   amLODipine (NORVASC) 5 MG tablet TAKE ONE TABLET BY MOUTH ONCE DAILY 90 tablet 3   atorvastatin (LIPITOR) 10 MG tablet TAKE ONE TABLET BY MOUTH ONCE DAILY 90 tablet 3   b complex vitamins tablet Take 1 tablet by mouth daily.     BIOTIN PO Take by mouth.     Cholecalciferol (VITAMIN D-1000 MAX ST) 25 MCG (1000 UT) tablet Take 2,000 Units by mouth daily. Takes three 50 mcg tablets, 3 capsules daily     Emollient (COLLAGEN EX)      ferrous sulfate 325 (65 FE) MG tablet Take 1 tablet (325 mg total) by mouth 2 (two) times daily with a meal.     glipiZIDE (GLUCOTROL) 5 MG tablet Take 2 tablets (10 mg total) by mouth daily before breakfast.     metFORMIN (GLUCOPHAGE) 500 MG tablet Take 2 tablets (1,000 mg total) by mouth 2 (two) times daily with a meal.     Multiple Vitamins-Minerals (WOMENS MULTIVITAMIN PO) Take by mouth.     omeprazole (PRILOSEC) 20 MG capsule TAKE ONE CAPSULE BY MOUTH EVERYDAY AT BEDTIME 90 capsule 3   pregabalin (LYRICA) 150 MG capsule Take 300 mg by mouth at bedtime.     rOPINIRole (REQUIP) 1 MG tablet TAKE 1/2 TABLET BY MOUTH EVERY MORNING and TAKE THREE TABLETS BY MOUTH EVERYDAY AT BEDTIME 315 tablet 3   sertraline (ZOLOFT) 100 MG tablet Take 2 tablets (200 mg total) by mouth every morning. 180 tablet 1   temazepam (RESTORIL) 15 MG capsule Take 1 capsule (15 mg total) by mouth at bedtime as needed for sleep. 30 capsule 5   zinc gluconate 50 MG tablet Take 50 mg by mouth daily.     ALPRAZolam (XANAX) 0.5 MG tablet TAKE ONE TABLET BY MOUTH daily as needed FOR ANXIETY 30 tablet 5    busPIRone (BUSPAR) 15 MG tablet Take 1 tablet (15 mg total) by mouth 2 (two) times daily. 180 tablet 1   glucose blood (ONETOUCH ULTRA) test strip USE TO check blood glucose ONCE DAILY AND AS NEEDED FOR DIABETES 100 strip 1   predniSONE (DELTASONE) 10 MG tablet Take 4 pills once daily by mouth for 3 days, then 3 pills daily for 3 days, then 2 pills daily for 3 days then 1 pill daily for 3 days then stop (Patient not taking: Reported on 11/03/2021) 30 tablet 0   Suvorexant (BELSOMRA) 20 MG TABS Take 20 mg by mouth at bedtime as needed. (Patient not  taking: Reported on 11/03/2021) 30 tablet 5   Suvorexant (BELSOMRA) 20 MG TABS Take 20 mg by mouth at bedtime. (Patient not taking: Reported on 11/03/2021) 10 tablet 0   traZODone (DESYREL) 50 MG tablet Take 1-2 tabs po QHS prn insomnia 180 tablet 0   No current facility-administered medications for this visit.   Facility-Administered Medications Ordered in Other Visits  Medication Dose Route Frequency Provider Last Rate Last Admin   influenza vaccine adjuvanted (FLUAD) injection 0.5 mL  0.5 mL Intramuscular Once Charlaine Dalton R, MD       sodium chloride flush (NS) 0.9 % injection 10 mL  10 mL Intravenous PRN Cammie Sickle, MD   10 mL at 09/11/15 0845    Medication Side Effects: Other: Possible dizziness  Allergies:  Allergies  Allergen Reactions   Amoxicillin Swelling    REACTION: rash, swelling Has patient had a PCN reaction causing immediate rash, facial/tongue/throat swelling, SOB or lightheadedness with hypotension: yes Has patient had a PCN reaction causing severe rash involving mucus membranes or skin necrosis: no Has patient had a PCN reaction that required hospitalization: no Has patient had a PCN reaction occurring within the last 10 years: yes If all of the above answers are "NO", then may proceed with Cephalosporin use.  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 Neo-adjuvant chemo with complete pathologic response.    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   Please see review of systems for further details on the patient's review from today.   Objective:   Physical Exam:  There were no vitals taken for this visit.  Physical Exam Constitutional:      General: She is not in acute distress. Musculoskeletal:        General: No deformity.  Neurological:     Mental Status: She is alert and oriented to person, place, and time.     Coordination: Coordination normal.  Psychiatric:        Attention and Perception: Attention and perception normal. She does not perceive auditory or visual hallucinations.        Mood and Affect: Affect is not labile, blunt, angry or inappropriate.        Speech: Speech normal.        Behavior: Behavior normal.  Thought Content: Thought content normal. Thought content is not paranoid or delusional. Thought content does not  include homicidal or suicidal ideation. Thought content does not include homicidal or suicidal plan.        Cognition and Memory: Cognition and memory normal.        Judgment: Judgment normal.     Comments: Insight intact Mood is mildly anxious and depressed     Lab Review:     Component Value Date/Time   NA 135 10/30/2021 1542   NA 142 03/01/2014 0846   K 3.0 (L) 10/30/2021 1542   K 4.3 03/01/2014 0846   CL 101 10/30/2021 1542   CL 102 03/01/2014 0846   CO2 24 10/30/2021 1542   CO2 28 03/01/2014 0846   GLUCOSE 148 (H) 10/30/2021 1542   GLUCOSE 219 (H) 03/01/2014 0846   BUN 18 10/30/2021 1542   BUN 12 03/01/2014 0846   CREATININE 0.92 10/30/2021 1542   CREATININE 0.95 03/01/2014 0846   CALCIUM 8.5 (L) 10/30/2021 1542   CALCIUM 8.6 03/01/2014 0846   PROT 7.4 10/30/2021 1542   PROT 7.5 03/01/2014 0846   ALBUMIN 3.5 10/30/2021 1542   ALBUMIN 3.4 03/01/2014 0846   AST 25 10/30/2021 1542   AST 20 03/01/2014 0846   ALT 22 10/30/2021 1542   ALT 28 03/01/2014 0846   ALKPHOS 69 10/30/2021 1542   ALKPHOS 122 (H) 03/01/2014 0846   BILITOT 0.5 10/30/2021 1542   BILITOT 0.2 03/01/2014 0846   GFRNONAA >60 10/30/2021 1542   GFRNONAA >60 03/01/2014 0846   GFRNONAA >60 08/24/2013 1015   GFRAA >60 10/20/2019 0950   GFRAA >60 03/01/2014 0846   GFRAA >60 08/24/2013 1015       Component Value Date/Time   WBC 11.9 (H) 10/30/2021 1542   RBC 4.19 10/30/2021 1542   HGB 11.2 (L) 10/30/2021 1542   HGB 11.5 (L) 03/01/2014 0846   HCT 36.2 10/30/2021 1542   HCT 36.4 03/01/2014 0846   PLT 239 10/30/2021 1542   PLT 254 03/01/2014 0846   MCV 86.4 10/30/2021 1542   MCV 81 03/01/2014 0846   MCH 26.7 10/30/2021 1542   MCHC 30.9 10/30/2021 1542   RDW 14.6 10/30/2021 1542   RDW 15.6 (H) 03/01/2014 0846   LYMPHSABS 1.1 10/30/2021 1542   LYMPHSABS 1.0 03/01/2014 0846   MONOABS 0.8 10/30/2021 1542   MONOABS 0.3 03/01/2014 0846   EOSABS 0.1 10/30/2021 1542   EOSABS 0.1 03/01/2014 0846    BASOSABS 0.0 10/30/2021 1542   BASOSABS 0.0 03/01/2014 0846    No results found for: "POCLITH", "LITHIUM"   No results found for: "PHENYTOIN", "PHENOBARB", "VALPROATE", "CBMZ"   .res Assessment: Plan:    Pt seen for 30 minutes and time spent discussing insomnia and treatment options. She reports that she has not been able to afford Belsomra, which has been effective and well-tolerated, and instead has increased Trazodone on her own to 150 mg QHS and taking in combination with Alprazolam. Discussed that higher dose of Trazodone could be contributing to recent dizziness and falls, particularly with postural changes.  Discussed re-applying for Belsomra patient assistance and indicating that the current cost without financial assistance is a financial hardship. Will print script for Belsomra 20 mg #10 tabs and free trial voucher. Pt also provided with patient assistance application.  Pt asks about alternative to higher dose of Trazodone and Alprazolam while exploring assistance for Belsomra. Discussed potential benefits, risks, and side effects of Temazepam. Discussed that it would be  used in place of Alprazolam since they are both benzodiazepines. Pt verbalizes understanding. Discussed that Alprazolam prn could be used during the daytime since she reports that she takes Alprazolam periodically during the daytime for anxiety with stressful situations, ie. Attending a funeral. Will reduce quantity of Alprazolam from #60 to #30 since it will no longer be used for insomnia.  Pt advised to contact office if Temazepam is not effective or if side effects occur.  She requests a refill on Trazodone in case Temazepam is ineffective since she just ran out of Trazodone. Will refill Trazodone prn. Advised pt to avoid taking three 50 mg tabs since based on her report dizziness and falls may have started around the time she started taking 150 mg. Discussed using lowest possible effective dose of Trazodone and not  taking it if Temazepam alone is effective for her insomnia. Advised pt to use caution when moving from lying to sitting and sitting to standing and trying to rise slowly to minimize risk of orthostasis. Continue Buspar 15 mg po BID for anxiety.  Continue Sertraline 200 mg po qd for depression and anxiety.  Pt to follow-up in 6 months or sooner if clinically indicated.  Patient advised to contact office with any questions, adverse effects, or acute worsening in signs and symptoms.  [place] was seen today for insomnia and medication problem.  Diagnoses and all orders for this visit:  Anxiety state -     ALPRAZolam (XANAX) 0.5 MG tablet; TAKE ONE TABLET BY MOUTH daily as needed FOR ANXIETY -     busPIRone (BUSPAR) 15 MG tablet; Take 1 tablet (15 mg total) by mouth 2 (two) times daily.  Insomnia, unspecified type -     Suvorexant (BELSOMRA) 20 MG TABS; Take 20 mg by mouth at bedtime. (Patient not taking: Reported on 11/03/2021) -     temazepam (RESTORIL) 15 MG capsule; Take 1 capsule (15 mg total) by mouth at bedtime as needed for sleep. -     traZODone (DESYREL) 50 MG tablet; Take 1-2 tabs po QHS prn insomnia     Please see After Visit Summary for patient specific instructions.  Future Appointments  Date Time Provider [place]  11/26/2021  1:00 PM [facility] LAB [facility] None  11/26/2021  1:30 PM Cammie Sickle, MD [facility] None  11/26/2021  2:00 PM [facility] INFUSION CHAIR 1 [facility] None  05/04/2022  9:30 AM Thayer Headings, PMHNP [facility] None    No orders of the defined types were placed in this encounter.   -------------------------------

## 2021-11-03 NOTE — Telephone Encounter (Signed)
Pharmacy lvm that the received a script  from [facility] for temazapam and they need clarification. Please call the pharmacy at [phone]

## 2021-11-04 ENCOUNTER — Telehealth: Payer: Self-pay

## 2021-11-04 NOTE — Telephone Encounter (Signed)
Pharmacy thought patient was on a lot of medication and just wanted to verify:  Pregabalin - different provider  Alprazolam Temazepam Belsomra

## 2021-11-04 NOTE — Chronic Care Management (AMB) (Signed)
Chronic Care Management Pharmacy Assistant   Name: Suzanne Strong  MRN: [id number] DOB: September 03, 1945  Reason for Encounter: Hospital Follow Up    Medications: Outpatient Encounter Medications as of 11/04/2021  Medication Sig   acetaminophen (TYLENOL) 325 MG tablet Take 2 tablets (650 mg total) by mouth every 6 (six) hours as needed for mild pain (or Fever >/= 101).   albuterol (VENTOLIN HFA) 108 (90 Base) MCG/ACT inhaler Inhale 2 puffs into the lungs every 6 (six) hours as needed for wheezing or shortness of breath.   ALPRAZolam (XANAX) 0.5 MG tablet TAKE ONE TABLET BY MOUTH daily as needed FOR ANXIETY   amitriptyline (ELAVIL) 50 MG tablet Take 1 tablet (50 mg total) by mouth at bedtime.   amLODipine (NORVASC) 5 MG tablet TAKE ONE TABLET BY MOUTH ONCE DAILY   atorvastatin (LIPITOR) 10 MG tablet TAKE ONE TABLET BY MOUTH ONCE DAILY   b complex vitamins tablet Take 1 tablet by mouth daily.   BIOTIN PO Take by mouth.   busPIRone (BUSPAR) 15 MG tablet Take 1 tablet (15 mg total) by mouth 2 (two) times daily.   Cholecalciferol (VITAMIN D-1000 MAX ST) 25 MCG (1000 UT) tablet Take 2,000 Units by mouth daily. Takes three 50 mcg tablets, 3 capsules daily   Emollient (COLLAGEN EX)    ferrous sulfate 325 (65 FE) MG tablet Take 1 tablet (325 mg total) by mouth 2 (two) times daily with a meal.   glipiZIDE (GLUCOTROL) 5 MG tablet Take 2 tablets (10 mg total) by mouth daily before breakfast.   glucose blood (ONETOUCH ULTRA) test strip USE TO check blood glucose ONCE DAILY AND AS NEEDED FOR DIABETES   metFORMIN (GLUCOPHAGE) 500 MG tablet Take 2 tablets (1,000 mg total) by mouth 2 (two) times daily with a meal.   Multiple Vitamins-Minerals (WOMENS MULTIVITAMIN PO) Take by mouth.   omeprazole (PRILOSEC) 20 MG capsule TAKE ONE CAPSULE BY MOUTH EVERYDAY AT BEDTIME   predniSONE (DELTASONE) 10 MG tablet Take 4 pills once daily by mouth for 3 days, then 3 pills daily for 3 days, then 2 pills daily for 3 days  then 1 pill daily for 3 days then stop (Patient not taking: Reported on 11/03/2021)   pregabalin (LYRICA) 150 MG capsule Take 300 mg by mouth at bedtime.   rOPINIRole (REQUIP) 1 MG tablet TAKE 1/2 TABLET BY MOUTH EVERY MORNING and TAKE THREE TABLETS BY MOUTH EVERYDAY AT BEDTIME   sertraline (ZOLOFT) 100 MG tablet Take 2 tablets (200 mg total) by mouth every morning.   Suvorexant (BELSOMRA) 20 MG TABS Take 20 mg by mouth at bedtime as needed. (Patient not taking: Reported on 11/03/2021)   Suvorexant (BELSOMRA) 20 MG TABS Take 20 mg by mouth at bedtime. (Patient not taking: Reported on 11/03/2021)   temazepam (RESTORIL) 15 MG capsule Take 1 capsule (15 mg total) by mouth at bedtime as needed for sleep.   traZODone (DESYREL) 50 MG tablet Take 1-2 tabs po QHS prn insomnia   zinc gluconate 50 MG tablet Take 50 mg by mouth daily.   Facility-Administered Encounter Medications as of 11/04/2021  Medication   influenza vaccine adjuvanted (FLUAD) injection 0.5 mL   sodium chloride flush (NS) 0.9 % injection 10 mL     Reviewed hospital notes for details of recent visit. Patient has been contacted by Transitions of Care team: No  Admitted to the ED on 10/30/21. Discharge date was 10/30/21.  Discharged from [facility] ED . Discharge diagnosis (Principal Problem): Dizzy,weakness  Patient was discharged to Home  Brief summary of hospital course: Pt coming from home for weakness and dizziness starting 2 days ago. Pt fell three weeks ago for  fall where she hit her head, pt denies loc at that time. PT denies and headache,labs, CT scan Head, left without being seen.  Medications that remain the same after Hospital Discharge:??  -All other medications will remain the same.    Next CCM appt: none  Other upcoming appts:  11/26/21  Oncology labs.  Charlene Brooke, PharmD notified and will determine if action is needed.  Avel Sensor, [place]  [phone]

## 2021-11-04 NOTE — Telephone Encounter (Signed)
Please refer to yesterday's note. Pt had increased Trazodone to 150 mg on her own with Xanax to help with sleep and has been having dizziness and falls. She sleeps well with Belsomra and does not have side effects, however her cost is $140 when she is in the donut hole. She is applying for pt assistance for Belsomra again. She has used Belsomra samples alone periodically in the past when she has not been able to sleep well for several consecutive nights and that it why the free trial was sent. She requested an alternative while trying to get Belsomra through Pt Assistance. Discussed that she could try Temazepam at night and leave off Alprazolam at bedtime and try to reduce Trazodone. This is why Alprazolam qty was reduced from #60 to #30 since she reports that she would still like to have Alprazolam prn available during the day if needed for anxiety. The goal is to try to get her on Belsomra or Temazepam and reduce Alprazolam and Trazodone since these seem to be causing adverse effects.

## 2021-11-04 NOTE — Telephone Encounter (Signed)
Called pharmacy and provided this info.

## 2021-11-18 ENCOUNTER — Telehealth: Payer: Self-pay | Admitting: Psychiatry

## 2021-11-18 NOTE — Telephone Encounter (Signed)
Contacted the patient and she reports improvement in weakness in her legs. Thayer Headings PMFNP changes her medication from trazedone (side effects from this medication) to temazepam. She will trial this medication. So far it has helped and weakness  improving.  Charlene Brooke, CPP notified  Avel Sensor, [place]  [phone]

## 2021-11-18 NOTE — Telephone Encounter (Signed)
Informed [facility],she is ok with that

## 2021-11-18 NOTE — Telephone Encounter (Signed)
Patient lvm stating her Alprazolam was changed on her last appointment with Janett Billow. She is stating [facility] never received the Rx. Please advise.  Contact information [phone]

## 2021-11-26 ENCOUNTER — Inpatient Hospital Stay: Payer: PPO | Attending: Internal Medicine

## 2021-11-26 ENCOUNTER — Inpatient Hospital Stay: Payer: PPO

## 2021-11-26 ENCOUNTER — Other Ambulatory Visit: Payer: Self-pay | Admitting: Psychiatry

## 2021-11-26 ENCOUNTER — Inpatient Hospital Stay (HOSPITAL_BASED_OUTPATIENT_CLINIC_OR_DEPARTMENT_OTHER): Payer: PPO | Admitting: Internal Medicine

## 2021-11-26 ENCOUNTER — Encounter: Payer: Self-pay | Admitting: Internal Medicine

## 2021-11-26 DIAGNOSIS — M899 Disorder of bone, unspecified: Secondary | ICD-10-CM

## 2021-11-26 DIAGNOSIS — C50412 Malignant neoplasm of upper-outer quadrant of left female breast: Secondary | ICD-10-CM | POA: Insufficient documentation

## 2021-11-26 DIAGNOSIS — Z87891 Personal history of nicotine dependence: Secondary | ICD-10-CM | POA: Diagnosis not present

## 2021-11-26 DIAGNOSIS — Z171 Estrogen receptor negative status [ER-]: Secondary | ICD-10-CM | POA: Diagnosis not present

## 2021-11-26 DIAGNOSIS — M8580 Other specified disorders of bone density and structure, unspecified site: Secondary | ICD-10-CM | POA: Diagnosis not present

## 2021-11-26 DIAGNOSIS — D649 Anemia, unspecified: Secondary | ICD-10-CM | POA: Insufficient documentation

## 2021-11-26 DIAGNOSIS — Z8589 Personal history of malignant neoplasm of other organs and systems: Secondary | ICD-10-CM | POA: Insufficient documentation

## 2021-11-26 DIAGNOSIS — F411 Generalized anxiety disorder: Secondary | ICD-10-CM

## 2021-11-26 LAB — CBC WITH DIFFERENTIAL/PLATELET
Abs Immature Granulocytes: 0.05 10*3/uL (ref 0.00–0.07)
Basophils Absolute: 0 10*3/uL (ref 0.0–0.1)
Basophils Relative: 0 %
Eosinophils Absolute: 0.1 10*3/uL (ref 0.0–0.5)
Eosinophils Relative: 3 %
HCT: 34 % — ABNORMAL LOW (ref 36.0–46.0)
Hemoglobin: 10.6 g/dL — ABNORMAL LOW (ref 12.0–15.0)
Immature Granulocytes: 1 %
Lymphocytes Relative: 22 %
Lymphs Abs: 1.2 10*3/uL (ref 0.7–4.0)
MCH: 26.9 pg (ref 26.0–34.0)
MCHC: 31.2 g/dL (ref 30.0–36.0)
MCV: 86.3 fL (ref 80.0–100.0)
Monocytes Absolute: 0.4 10*3/uL (ref 0.1–1.0)
Monocytes Relative: 7 %
Neutro Abs: 3.7 10*3/uL (ref 1.7–7.7)
Neutrophils Relative %: 67 %
Platelets: 275 10*3/uL (ref 150–400)
RBC: 3.94 MIL/uL (ref 3.87–5.11)
RDW: 14.3 % (ref 11.5–15.5)
WBC: 5.4 10*3/uL (ref 4.0–10.5)
nRBC: 0 % (ref 0.0–0.2)

## 2021-11-26 LAB — COMPREHENSIVE METABOLIC PANEL
ALT: 13 U/L (ref 0–44)
AST: 19 U/L (ref 15–41)
Albumin: 3.5 g/dL (ref 3.5–5.0)
Alkaline Phosphatase: 78 U/L (ref 38–126)
Anion gap: 6 (ref 5–15)
BUN: 19 mg/dL (ref 8–23)
CO2: 30 mmol/L (ref 22–32)
Calcium: 8.8 mg/dL — ABNORMAL LOW (ref 8.9–10.3)
Chloride: 104 mmol/L (ref 98–111)
Creatinine, Ser: 0.84 mg/dL (ref 0.44–1.00)
GFR, Estimated: >60 mL/min (ref >60)
Glucose, Bld: 134 mg/dL — ABNORMAL HIGH (ref 70–99)
Potassium: 4.2 mmol/L (ref 3.5–5.1)
Sodium: 140 mmol/L (ref 135–145)
Total Bilirubin: 0.1 mg/dL — ABNORMAL LOW (ref 0.3–1.2)
Total Protein: 7.4 g/dL (ref 6.5–8.1)

## 2021-11-26 MED ORDER — ZOLEDRONIC ACID 5 MG/100ML IV SOLN
5.0000 mg | Freq: Once | INTRAVENOUS | Status: AC
  Administered 2021-11-26: 5 mg via INTRAVENOUS
  Filled 2021-11-26: qty 100

## 2021-11-26 MED ORDER — SODIUM CHLORIDE 0.9 % IV SOLN
Freq: Once | INTRAVENOUS | Status: AC
  Filled 2021-11-26: qty 250

## 2021-11-26 NOTE — Patient Instructions (Signed)

## 2021-11-26 NOTE — Assessment & Plan Note (Addendum)
#   Left breast cancer [2017] stage III  TRIPLE NEGATIVE-  s/p neoadjuvant chemotherapy-currently under surveillance. SEP 2022- [Dr>Byrnett] BIL mammogram March 2023- STABLE. Defer to [facility]   # Continue close surveillance; with out evidence of recurrence; will switch over to annual surveillance starting this week.   # Mild anemia -hemoglobin 10-11 for the last 10 years; stable.  Discussed the etiology is unclear however since his chronic asymptomatic-s/p EGD and colonoscopy 2022. June 2022-ferritin 6%- on PO iron STABLE.   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   # OSTEOPENIA- AUG 2021- T score -1.8; HOLD reclast- sec to calcium- 8.9  on Vit D; continue 500 mg BID; Proceed with reclast today.    # DISPOSITION: # reclast today # follow up in 12 with MD/labs-cbc/cmp/ca-27-29; reclast- -Dr.B

## 2021-11-26 NOTE — Progress Notes (Signed)
S[facility] OFFICE PROGRESS NOTE  Patient Care Team: Tower, [name], MD as PCP - General Byrnett, [name], MD (General Surgery) [name], MD (Oncology) Alda Berthold, DO as Consulting Physician (Neurology) Cammie Sickle, MD as Consulting Physician (Oncology) Noreene Filbert, MD as Radiation Oncologist (Radiation Oncology) Charlton Haws, [facility] as Pharmacist (Pharmacist)   Cancer Staging  No matching staging information was found for the patient.   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  5 CM TUMOR MASS BIOPSIES POSITIVE FOR INVASIVE MAMMARY CARCINOMA TRIPLE NEGATIVE DISEASE(jANUARY, 2017)stage IIIa. ER/PR/her2 Neu-NEGATIVE  # Cytoxan and Adriamycin from March 18, 2015; carbo-taxol x4 cycles  # AUg 18th s/p Lumpec & SLNBx- Complete path CR [ypT0 ypsn0] s/p RT [oct 2017; Dr.Crystal]  3.  MUGA scan of the heart shows ejection fraction to be 61% (January, 2017)   EGD/colonoscopy- Emmaline Kluver 2022]-  ------------------------------------------------------------    DIAGNOSIS: BREAST CANCER  STAGE:  III       ;GOALS: curative  CURRENT/MOST RECENT THERAPY: surveillaince.     Carcinoma of upper-outer quadrant of left breast in female, estrogen receptor negative ([place])  11/21/2015 Initial Diagnosis   Carcinoma of upper-outer quadrant of left breast in female, estrogen receptor negative ([place])     INTERVAL HISTORY: Ambulating independently.  Alone.  Suzanne Strong 76 y.o.  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  Pt in for follow up, denies any changes or concerns today. Patient denies any new lumps or bumps.  Appetite is good.  No  weight loss.  No headaches.  Chronic tingling and numbness in extremities. She continues to be on Lyrica/ Amitriptyline.  Patient currently off trazodone because of history of falls.   Review of Systems  Constitutional:  Negative for chills, diaphoresis, fever, malaise/fatigue and weight loss.  HENT:  Negative for nosebleeds and sore throat.   Eyes:  Negative for double vision.  Respiratory:  Negative for hemoptysis, shortness of breath and wheezing.   Cardiovascular:  Negative for chest pain, palpitations, orthopnea and leg swelling.  Gastrointestinal:  Negative for abdominal pain, blood in stool, constipation, diarrhea, heartburn, melena, nausea and vomiting.  Genitourinary:  Negative for dysuria, frequency and urgency.  Musculoskeletal:  Negative for back pain and joint pain.  Skin: Negative.  Negative for itching and rash.  Neurological:  Positive for tingling. Negative for focal weakness, weakness and headaches.  Endo/Heme/Allergies:  Does not bruise/bleed easily.  Psychiatric/Behavioral:  Negative for depression. The patient is not nervous/anxious and does not have insomnia.       PAST MEDICAL HISTORY :  Past Medical History:  Diagnosis Date   Anemia    iron deficiency and B12 deficiency   Anxiety    Breast cancer ([place]) 02/25/2015   upper-outer quadrant of left female breast, Triple Negative. Neo-adjuvant chemo with complete pathologic response.    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   BREAST CYST ASPIRATION     BREAST EXCISIONAL BIOPSY Left 12/2015   surgery   BREAST LUMPECTOMY WITH NEEDLE LOCALIZATION Left 10/02/2015   Procedure: BREAST LUMPECTOMY WITH NEEDLE LOCALIZATION;  Surgeon: [name], MD;  Location: [facility];  Service: General;  Laterality: Left;   BREAST LUMPECTOMY WITH SENTINEL LYMPH NODE BIOPSY Left 10/02/2015   Procedure: LEFT BREAST WIDE EXCISION WITH SENTINEL LYMPH NODE BX;  Surgeon: [name], MD;  Location: [facility];  Service: General;  Laterality: Left;   BREAST SURGERY     breast biopsy benign   CARPAL TUNNEL RELEASE Right 05/19/2017   Procedure: CARPAL TUNNEL RELEASE;  Surgeon: Earnestine Leys, MD;  Location: [facility];  Service: Orthopedics;  Laterality: Right;   CATARACT EXTRACTION W/PHACO Right 10/26/2018   Procedure: CATARACT EXTRACTION PHACO AND INTRAOCULAR LENS PLACEMENT ([place]) right diabetic;  Surgeon: Leandrew Koyanagi, MD;  Location: [facility];  Service: Ophthalmology;  Laterality: Right;  Diabetic - oral meds cancer center been notified and will come   CATARACT EXTRACTION W/PHACO Left 11/16/2018   Procedure: CATARACT EXTRACTION PHACO AND INTRAOCULAR LENS PLACEMENT ([facility]) LEFT DIABETIC  01:01.0  17.0%  10.37;  Surgeon: Leandrew Koyanagi, MD;  Location: [facility];  Service: Ophthalmology;  Laterality: Left;  Diabetic - oral meds Port-a-cath   CHOLECYSTECTOMY     Cologuard  07/22/2016   Negative   COLONOSCOPY WITH PROPOFOL N/A 05/24/2020   Procedure: COLONOSCOPY WITH PROPOFOL;  Surgeon: Virgel Manifold, MD;  Location: [facility];  Service: Endoscopy;  Laterality: N/A;   ESOPHAGOGASTRODUODENOSCOPY  07/2009   normal with few gastric polyps   ESOPHAGOGASTRODUODENOSCOPY (EGD) WITH PROPOFOL N/A 05/24/2020   Procedure:  ESOPHAGOGASTRODUODENOSCOPY (EGD) WITH PROPOFOL;  Surgeon: Virgel Manifold, MD;  Location: [facility];  Service: Endoscopy;  Laterality: N/A;   MOLE REMOVAL     PORT-A-CATH REMOVAL     PORTACATH PLACEMENT     PORTACATH PLACEMENT Right 03/12/2015   Procedure: INSERTION PORT-A-CATH;  Surgeon: [name], MD;  Location: [facility];  Service: General;  Laterality: Right;   RADICAL NECK DISSECTION     TONSILLECTOMY     cancer treated with chemo and radiation   TRIGGER FINGER RELEASE Right 05/19/2017   Procedure: RELEASE TRIGGER FINGER/A-1 PULLEY ring finger;  Surgeon: Earnestine Leys, MD;  Location: [facility];  Service: Orthopedics;  Laterality: Right;    FAMILY HISTORY :   Family History  Problem Relation Age of Onset   Osteoporosis Mother    Hyperlipidemia Mother    Depression Mother    Cancer Mother        skin cancer ? basal cell and lung ca heavy smoker   Alcohol abuse Father    Cancer Father        skin CA ? basal cell   Heart disease Father        CHF   Hyperlipidemia Brother    Hypertension Brother    Breast cancer Neg Hx     SOCIAL HISTORY:   Social History   Tobacco Use  Smoking status: Former    Packs/day: 0.50    Years: 6.00    Total pack years: 3.00    Types: Cigarettes    Quit date: 03/10/1984    Years since quitting: 37.7   Smokeless tobacco: Never  Vaping Use   Vaping Use: Never used  Substance Use Topics   Alcohol use: No    Alcohol/week: 0.0 standard drinks of alcohol   Drug use: No    ALLERGIES:  is allergic to amoxicillin, penicillin g, fluconazole, difuleron [ferrous fumarate-dss], and other.  MEDICATIONS:  Current Outpatient Medications  Medication Sig Dispense Refill   acetaminophen (TYLENOL) 325 MG tablet Take 2 tablets (650 mg total) by mouth every 6 (six) hours as needed for mild pain (or Fever >/= 101).     albuterol (VENTOLIN HFA) 108 (90 Base) MCG/ACT inhaler Inhale 2 puffs into the lungs every 6 (six) hours as needed for  wheezing or shortness of breath. 8 g 2   ALPRAZolam (XANAX) 0.5 MG tablet TAKE ONE TABLET BY MOUTH daily as needed FOR ANXIETY 30 tablet 5   amitriptyline (ELAVIL) 50 MG tablet Take 1 tablet (50 mg total) by mouth at bedtime. 90 tablet 3   amLODipine (NORVASC) 5 MG tablet TAKE ONE TABLET BY MOUTH ONCE DAILY 90 tablet 3   atorvastatin (LIPITOR) 10 MG tablet TAKE ONE TABLET BY MOUTH ONCE DAILY 90 tablet 3   b complex vitamins tablet Take 1 tablet by mouth daily.     BIOTIN PO Take by mouth.     busPIRone (BUSPAR) 15 MG tablet Take 1 tablet (15 mg total) by mouth 2 (two) times daily. 180 tablet 1   Cholecalciferol (VITAMIN D-1000 MAX ST) 25 MCG (1000 UT) tablet Take 2,000 Units by mouth daily. Takes three 50 mcg tablets, 3 capsules daily     Emollient (COLLAGEN EX)      ferrous sulfate 325 (65 FE) MG tablet Take 1 tablet (325 mg total) by mouth 2 (two) times daily with a meal.     glipiZIDE (GLUCOTROL) 5 MG tablet Take 2 tablets (10 mg total) by mouth daily before breakfast.     glucose blood (ONETOUCH ULTRA) test strip USE TO check blood glucose ONCE DAILY AND AS NEEDED FOR DIABETES 100 strip 1   metFORMIN (GLUCOPHAGE) 500 MG tablet Take 2 tablets (1,000 mg total) by mouth 2 (two) times daily with a meal.     Multiple Vitamins-Minerals (WOMENS MULTIVITAMIN PO) Take by mouth.     omeprazole (PRILOSEC) 20 MG capsule TAKE ONE CAPSULE BY MOUTH EVERYDAY AT BEDTIME 90 capsule 3   pregabalin (LYRICA) 150 MG capsule Take 300 mg by mouth at bedtime.     rOPINIRole (REQUIP) 1 MG tablet TAKE 1/2 TABLET BY MOUTH EVERY MORNING and TAKE THREE TABLETS BY MOUTH EVERYDAY AT BEDTIME 315 tablet 3   sertraline (ZOLOFT) 100 MG tablet Take 2 tablets (200 mg total) by mouth every morning. 180 tablet 1   temazepam (RESTORIL) 15 MG capsule Take 1 capsule (15 mg total) by mouth at bedtime as needed for sleep. 30 capsule 5   zinc gluconate 50 MG tablet Take 50 mg by mouth daily.     No current facility-administered  medications for this visit.   Facility-Administered Medications Ordered in Other Visits  Medication Dose Route Frequency Provider Last Rate Last Admin   influenza vaccine adjuvanted (FLUAD) injection 0.5 mL  0.5 mL Intramuscular Once Charlaine Dalton R, MD       sodium chloride flush (NS)  0.9 % injection 10 mL  10 mL Intravenous PRN Cammie Sickle, MD   10 mL at 09/11/15 0845    PHYSICAL EXAMINATION: ECOG PERFORMANCE STATUS: 1 - Symptomatic but completely ambulatory  BP (!) 143/78 (BP Location: Left Arm, Patient Position: Sitting)   Pulse 81   Temp (!) 97.5 F (36.4 C) (Tympanic)   Resp 16   Wt 209 lb (94.8 kg)   SpO2 98%   BMI 33.73 kg/m   Filed Weights   11/26/21 1338  Weight: 209 lb (94.8 kg)    Physical Exam Constitutional:      Comments: Obese.  She is walking herself.  Alone.  HENT:     Head: Normocephalic and atraumatic.     Mouth/Throat:     Pharynx: No oropharyngeal exudate.  Eyes:     Pupils: Pupils are equal, round, and reactive to light.  Cardiovascular:     Rate and Rhythm: Normal rate and regular rhythm.  Pulmonary:     Effort: No respiratory distress.     Breath sounds: No wheezing.  Abdominal:     General: Bowel sounds are normal. There is no distension.     Palpations: Abdomen is soft. There is no mass.     Tenderness: There is no abdominal tenderness. There is no guarding or rebound.  Musculoskeletal:        General: No tenderness. Normal range of motion.     Cervical back: Normal range of motion and neck supple.  Skin:    General: Skin is warm.  Neurological:     Mental Status: She is alert and oriented to person, place, and time.  Psychiatric:        Mood and Affect: Affect normal.     LABORATORY DATA:  I have reviewed the data as listed    Component Value Date/Time   NA 140 11/26/2021 1255   NA 142 03/01/2014 0846   K 4.2 11/26/2021 1255   K 4.3 03/01/2014 0846   CL 104 11/26/2021 1255   CL 102 03/01/2014 0846   CO2 30  11/26/2021 1255   CO2 28 03/01/2014 0846   GLUCOSE 134 (H) 11/26/2021 1255   GLUCOSE 219 (H) 03/01/2014 0846   BUN 19 11/26/2021 1255   BUN 12 03/01/2014 0846   CREATININE 0.84 11/26/2021 1255   CREATININE 0.95 03/01/2014 0846   CALCIUM 8.8 (L) 11/26/2021 1255   CALCIUM 8.6 03/01/2014 0846   PROT 7.4 11/26/2021 1255   PROT 7.5 03/01/2014 0846   ALBUMIN 3.5 11/26/2021 1255   ALBUMIN 3.4 03/01/2014 0846   AST 19 11/26/2021 1255   AST 20 03/01/2014 0846   ALT 13 11/26/2021 1255   ALT 28 03/01/2014 0846   ALKPHOS 78 11/26/2021 1255   ALKPHOS 122 (H) 03/01/2014 0846   BILITOT 0.1 (L) 11/26/2021 1255   BILITOT 0.2 03/01/2014 0846   GFRNONAA >60 11/26/2021 1255   GFRNONAA >60 03/01/2014 0846   GFRNONAA >60 08/24/2013 1015   GFRAA >60 10/20/2019 0950   GFRAA >60 03/01/2014 0846   GFRAA >60 08/24/2013 1015    No results found for: "SPEP", "UPEP"  Lab Results  Component Value Date   WBC 5.4 11/26/2021   NEUTROABS 3.7 11/26/2021   HGB 10.6 (L) 11/26/2021   HCT 34.0 (L) 11/26/2021   MCV 86.3 11/26/2021   PLT 275 11/26/2021      Chemistry      Component Value Date/Time   NA 140 11/26/2021 1255   NA 142 03/01/2014 0846  K 4.2 11/26/2021 1255   K 4.3 03/01/2014 0846   CL 104 11/26/2021 1255   CL 102 03/01/2014 0846   CO2 30 11/26/2021 1255   CO2 28 03/01/2014 0846   BUN 19 11/26/2021 1255   BUN 12 03/01/2014 0846   CREATININE 0.84 11/26/2021 1255   CREATININE 0.95 03/01/2014 0846      Component Value Date/Time   CALCIUM 8.8 (L) 11/26/2021 1255   CALCIUM 8.6 03/01/2014 0846   ALKPHOS 78 11/26/2021 1255   ALKPHOS 122 (H) 03/01/2014 0846   AST 19 11/26/2021 1255   AST 20 03/01/2014 0846   ALT 13 11/26/2021 1255   ALT 28 03/01/2014 0846   BILITOT 0.1 (L) 11/26/2021 1255   BILITOT 0.2 03/01/2014 0846       RADIOGRAPHIC STUDIES: I have personally reviewed the radiological images as listed and agreed with the findings in the report. No results found.    ASSESSMENT & PLAN:  Carcinoma of upper-outer quadrant of left breast in female, estrogen receptor negative ([place]) # Left breast cancer [2017] stage III  TRIPLE NEGATIVE-  s/p neoadjuvant chemotherapy-currently under surveillance. SEP 2022- [Dr>Byrnett] BIL mammogram March 2023- STABLE. Defer to [facility]   # Continue close surveillance; with out evidence of recurrence; will switch over to annual surveillance starting this week.   # Mild anemia -hemoglobin 10-11 for the last 10 years; stable.  Discussed the etiology is unclear however since his chronic asymptomatic-s/p EGD and colonoscopy 2022. June 2022-ferritin 6%- on PO iron STABLE.   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   # OSTEOPENIA- AUG 2021- T score -1.8; HOLD reclast- sec to calcium- 8.9  on Vit D; continue 500 mg BID; Proceed with reclast today.    # DISPOSITION: # reclast today # follow up in 12 with MD/labs-cbc/cmp/ca-27-29; reclast- -Dr.B   Orders Placed This Encounter  Procedures   CBC with Differential/Platelet    Standing Status:   Future    Standing Expiration Date:   11/26/2022   Comprehensive metabolic panel    Standing Status:   Future    Standing Expiration Date:   11/26/2022   Cancer antigen 27.29    Standing Status:   Future    Standing Expiration Date:   11/26/2022   All questions were answered. The patient knows to call the clinic with any problems, questions or concerns.      Cammie Sickle, MD 11/27/2021 8:46 AM

## 2021-11-26 NOTE — Progress Notes (Signed)
Pt in for follow up, denies any changes or concerns today.  

## 2021-11-27 ENCOUNTER — Ambulatory Visit (INDEPENDENT_AMBULATORY_CARE_PROVIDER_SITE_OTHER): Payer: PPO

## 2021-11-27 ENCOUNTER — Encounter: Payer: Self-pay | Admitting: Internal Medicine

## 2021-11-27 VITALS — Ht 66.0 in | Wt 209.0 lb

## 2021-11-27 DIAGNOSIS — Z0000 Encounter for general adult medical examination without abnormal findings: Secondary | ICD-10-CM

## 2021-11-27 DIAGNOSIS — Z1211 Encounter for screening for malignant neoplasm of colon: Secondary | ICD-10-CM

## 2021-11-27 LAB — CANCER ANTIGEN 27.29: CA 27.29: 12.8 U/mL (ref 0.0–38.6)

## 2021-11-27 NOTE — Patient Instructions (Addendum)
Suzanne Strong , Thank you for taking time to come for your Medicare Wellness Visit. I appreciate your ongoing commitment to your health goals. Please review the following plan we discussed and let me know if I can assist you in the future.   These are the goals we discussed:  Goals       Lose weight (pt-stated)      Patient Stated      Starting 07/15/18, I will continue to take medications as prescribed.       Track and Manage My Blood Pressure-Hypertension      Timeframe:  Long-Range Goal Priority:  Medium Start Date:      04/10/21                       Expected End Date:  04/10/22                     Follow Up Date August 2023   - check blood pressure 3 times per week - write blood pressure results in a log or diary    Why is this important?   You won't feel high blood pressure, but it can still hurt your blood vessels.  High blood pressure can cause heart or kidney problems. It can also cause a stroke.  Making lifestyle changes like losing a little weight or eating less salt will help.  Checking your blood pressure at home and at different times of the day can help to control blood pressure.  If the doctor prescribes medicine remember to take it the way the doctor ordered.  Call the office if you cannot afford the medicine or if there are questions about it.     Notes:         This is a list of the screening recommended for you and due dates:  Health Maintenance  Topic Date Due   Complete foot exam   07/06/2018   Yearly kidney health urinalysis for diabetes  02/11/2021   Hemoglobin A1C  06/11/2021   COVID-19 Vaccine (5 - Pfizer risk series) 12/13/2021*   Zoster (Shingles) Vaccine (1 of 2) 02/27/2022*   Flu Shot  05/17/2022*   Pneumonia Vaccine (3 - PPSV23 or PCV20) 11/28/2022*   Colon Cancer Screening  11/28/2022*   Eye exam for diabetics  05/17/2022   Tetanus Vaccine  06/02/2022   Yearly kidney function blood test for diabetes  11/27/2022   Mammogram  05/15/2023   DEXA  scan (bone density measurement)  Completed   Hepatitis C Screening: USPSTF Recommendation to screen - Ages 11-79 yo.  Completed   HPV Vaccine  Aged Out   Cologuard (Stool DNA test)  Discontinued  *Topic was postponed. The date shown is not the original due date.    Advanced directives: Advance directive discussed with you today. Even though you declined this today, please call our office should you change your mind, and we can give you the proper paperwork for you to fill out.   Conditions/risks identified: None  Next appointment: Follow up in one year for your annual wellness visit     Preventive Care 65 Years and Older, Female Preventive care refers to lifestyle choices and visits with your health care provider that can promote health and wellness. What does preventive care include? A yearly physical exam. This is also called an annual well check. Dental exams once or twice a year. Routine eye exams. Ask your health care provider how often you should have  your eyes checked. Personal lifestyle choices, including: Daily care of your teeth and gums. Regular physical activity. Eating a healthy diet. Avoiding tobacco and drug use. Limiting alcohol use. Practicing safe sex. Taking low-dose aspirin every day. Taking vitamin and mineral supplements as recommended by your health care provider. What happens during an annual well check? The services and screenings done by your health care provider during your annual well check will depend on your age, overall health, lifestyle risk factors, and family history of disease. Counseling  Your health care provider may ask you questions about your: Alcohol use. Tobacco use. Drug use. Emotional well-being. Home and relationship well-being. Sexual activity. Eating habits. History of falls. Memory and ability to understand (cognition). Work and work Statistician. Reproductive health. Screening  You may have the following tests or  measurements: Height, weight, and BMI. Blood pressure. Lipid and cholesterol levels. These may be checked every 5 years, or more frequently if you are over 58 years old. Skin check. Lung cancer screening. You may have this screening every year starting at age 3 if you have a 30-pack-year history of smoking and currently smoke or have quit within the past 15 years. Fecal occult blood test (FOBT) of the stool. You may have this test every year starting at age 59. Flexible sigmoidoscopy or colonoscopy. You may have a sigmoidoscopy every 5 years or a colonoscopy every 10 years starting at age 62. Hepatitis C blood test. Hepatitis B blood test. Sexually transmitted disease (STD) testing. Diabetes screening. This is done by checking your blood sugar (glucose) after you have not eaten for a while (fasting). You may have this done every 1-3 years. Bone density scan. This is done to screen for osteoporosis. You may have this done starting at age 85. Mammogram. This may be done every 1-2 years. Talk to your health care provider about how often you should have regular mammograms. Talk with your health care provider about your test results, treatment options, and if necessary, the need for more tests. Vaccines  Your health care provider may recommend certain vaccines, such as: Influenza vaccine. This is recommended every year. Tetanus, diphtheria, and acellular pertussis (Tdap, Td) vaccine. You may need a Td booster every 10 years. Zoster vaccine. You may need this after age 45. Pneumococcal 13-valent conjugate (PCV13) vaccine. One dose is recommended after age 34. Pneumococcal polysaccharide (PPSV23) vaccine. One dose is recommended after age 68. Talk to your health care provider about which screenings and vaccines you need and how often you need them. This information is not intended to replace advice given to you by your health care provider. Make sure you discuss any questions you have with your  health care provider. Document Released: 03/01/2015 Document Revised: 10/23/2015 Document Reviewed: 12/04/2014 Elsevier Interactive Patient Education  2017 [place] Prevention in the Home Falls can cause injuries. They can happen to people of all ages. There are many things you can do to make your home safe and to help prevent falls. What can I do on the outside of my home? Regularly fix the edges of walkways and driveways and fix any cracks. Remove anything that might make you trip as you walk through a door, such as a raised step or threshold. Trim any bushes or trees on the path to your home. Use bright outdoor lighting. Clear any walking paths of anything that might make someone trip, such as rocks or tools. Regularly check to see if handrails are loose or broken. Make sure that  both sides of any steps have handrails. Any raised decks and porches should have guardrails on the edges. Have any leaves, snow, or ice cleared regularly. Use sand or salt on walking paths during winter. Clean up any spills in your garage right away. This includes oil or grease spills. What can I do in the bathroom? Use night lights. Install grab bars by the toilet and in the tub and shower. Do not use towel bars as grab bars. Use non-skid mats or decals in the tub or shower. If you need to sit down in the shower, use a plastic, non-slip stool. Keep the floor dry. Clean up any water that spills on the floor as soon as it happens. Remove soap buildup in the tub or shower regularly. Attach bath mats securely with double-sided non-slip rug tape. Do not have throw rugs and other things on the floor that can make you trip. What can I do in the bedroom? Use night lights. Make sure that you have a light by your bed that is easy to reach. Do not use any sheets or blankets that are too big for your bed. They should not hang down onto the floor. Have a firm chair that has side arms. You can use this for  support while you get dressed. Do not have throw rugs and other things on the floor that can make you trip. What can I do in the kitchen? Clean up any spills right away. Avoid walking on wet floors. Keep items that you use a lot in easy-to-reach places. If you need to reach something above you, use a strong step stool that has a grab bar. Keep electrical cords out of the way. Do not use floor polish or wax that makes floors slippery. If you must use wax, use non-skid floor wax. Do not have throw rugs and other things on the floor that can make you trip. What can I do with my stairs? Do not leave any items on the stairs. Make sure that there are handrails on both sides of the stairs and use them. Fix handrails that are broken or loose. Make sure that handrails are as long as the stairways. Check any carpeting to make sure that it is firmly attached to the stairs. Fix any carpet that is loose or worn. Avoid having throw rugs at the top or bottom of the stairs. If you do have throw rugs, attach them to the floor with carpet tape. Make sure that you have a light switch at the top of the stairs and the bottom of the stairs. If you do not have them, ask someone to add them for you. What else can I do to help prevent falls? Wear shoes that: Do not have high heels. Have rubber bottoms. Are comfortable and fit you well. Are closed at the toe. Do not wear sandals. If you use a stepladder: Make sure that it is fully opened. Do not climb a closed stepladder. Make sure that both sides of the stepladder are locked into place. Ask someone to hold it for you, if possible. Clearly mark and make sure that you can see: Any grab bars or handrails. First and last steps. Where the edge of each step is. Use tools that help you move around (mobility aids) if they are needed. These include: Canes. Walkers. Scooters. Crutches. Turn on the lights when you go into a dark area. Replace any light bulbs as soon  as they burn out. Set up your furniture so  you have a clear path. Avoid moving your furniture around. If any of your floors are uneven, fix them. If there are any pets around you, be aware of where they are. Review your medicines with your doctor. Some medicines can make you feel dizzy. This can increase your chance of falling. Ask your doctor what other things that you can do to help prevent falls. This information is not intended to replace advice given to you by your health care provider. Make sure you discuss any questions you have with your health care provider. Document Released: 11/29/2008 Document Revised: 07/11/2015 Document Reviewed: 03/09/2014 Elsevier Interactive Patient Education  2017 [employer].

## 2021-11-27 NOTE — Progress Notes (Signed)
Subjective:   Suzanne Strong is a 76 y.o. female who presents for Medicare Annual (Subsequent) preventive examination.  Review of Systems    Virtual Visit via Telephone Note  I connected with  Suzanne Strong on 11/27/21 at  3:30 PM EDT by telephone and verified that I am speaking with the correct person using two identifiers.  Location: Patient: Home Provider: Office Persons participating in the virtual visit: patient/Nurse Health Advisor   I discussed the limitations, risks, security and privacy concerns of performing an evaluation and management service by telephone and the availability of in person appointments. The patient expressed understanding and agreed to proceed.  Interactive audio and video telecommunications were attempted between this nurse and patient, however failed, due to patient having technical difficulties OR patient did not have access to video capability.  We continued and completed visit with audio only.  Some vital signs may be absent or patient reported.   Suzanne Peaches, LPN  Cardiac Risk Factors include: advanced age (>61mn, >>39women);diabetes mellitus;hypertension     Objective:    Today's Vitals   11/27/21 1539  Weight: 209 lb (94.8 kg)  Height: '5\' 6"'$  (1.676 m)   Body mass index is 33.73 kg/m.     11/27/2021    3:47 PM 11/26/2021    1:32 PM 10/30/2021    3:40 PM 07/22/2021    2:14 PM 05/27/2021    1:11 PM 12/09/2020    6:07 PM 11/26/2020    1:09 PM  Advanced Directives  Does Patient Have a Medical Advance Directive? No No No No No No No  Does patient want to make changes to medical advance directive?     No - Patient declined  No - Patient declined  Would patient like information on creating a medical advance directive? No - Patient declined          Current Medications (verified) Outpatient Encounter Medications as of 11/27/2021  Medication Sig   acetaminophen (TYLENOL) 325 MG tablet Take 2 tablets (650 mg total) by mouth  every 6 (six) hours as needed for mild pain (or Fever >/= 101).   albuterol (VENTOLIN HFA) 108 (90 Base) MCG/ACT inhaler Inhale 2 puffs into the lungs every 6 (six) hours as needed for wheezing or shortness of breath.   ALPRAZolam (XANAX) 0.5 MG tablet TAKE ONE TABLET BY MOUTH daily as needed FOR ANXIETY   amitriptyline (ELAVIL) 50 MG tablet Take 1 tablet (50 mg total) by mouth at bedtime.   amLODipine (NORVASC) 5 MG tablet TAKE ONE TABLET BY MOUTH ONCE DAILY   atorvastatin (LIPITOR) 10 MG tablet TAKE ONE TABLET BY MOUTH ONCE DAILY   b complex vitamins tablet Take 1 tablet by mouth daily.   BIOTIN PO Take by mouth.   busPIRone (BUSPAR) 15 MG tablet Take 1 tablet (15 mg total) by mouth 2 (two) times daily.   Cholecalciferol (VITAMIN D-1000 MAX ST) 25 MCG (1000 UT) tablet Take 2,000 Units by mouth daily. Takes three 50 mcg tablets, 3 capsules daily   Emollient (COLLAGEN EX)    ferrous sulfate 325 (65 FE) MG tablet Take 1 tablet (325 mg total) by mouth 2 (two) times daily with a meal.   glipiZIDE (GLUCOTROL) 5 MG tablet Take 2 tablets (10 mg total) by mouth daily before breakfast.   glucose blood (ONETOUCH ULTRA) test strip USE TO check blood glucose ONCE DAILY AND AS NEEDED FOR DIABETES   metFORMIN (GLUCOPHAGE) 500 MG tablet Take 2 tablets (1,000 mg total)  by mouth 2 (two) times daily with a meal.   Multiple Vitamins-Minerals (WOMENS MULTIVITAMIN PO) Take by mouth.   omeprazole (PRILOSEC) 20 MG capsule TAKE ONE CAPSULE BY MOUTH EVERYDAY AT BEDTIME   pregabalin (LYRICA) 150 MG capsule Take 300 mg by mouth at bedtime.   rOPINIRole (REQUIP) 1 MG tablet TAKE 1/2 TABLET BY MOUTH EVERY MORNING and TAKE THREE TABLETS BY MOUTH EVERYDAY AT BEDTIME   sertraline (ZOLOFT) 100 MG tablet TAKE TWO TABLETS BY MOUTH EVERY MORNING   temazepam (RESTORIL) 15 MG capsule Take 1 capsule (15 mg total) by mouth at bedtime as needed for sleep.   zinc gluconate 50 MG tablet Take 50 mg by mouth daily.    Facility-Administered Encounter Medications as of 11/27/2021  Medication   influenza vaccine adjuvanted (FLUAD) injection 0.5 mL   sodium chloride flush (NS) 0.9 % injection 10 mL    Allergies (verified) Amoxicillin, Penicillin g, Fluconazole, Difuleron [ferrous fumarate-dss], and Other   History: Past Medical History:  Diagnosis Date   Anemia    iron deficiency and B12 deficiency   Anxiety    Breast cancer ([place]) 02/25/2015   upper-outer quadrant of left female breast, Triple Negative. Neo-adjuvant chemo with complete pathologic response.    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   BREAST CYST ASPIRATION     BREAST EXCISIONAL BIOPSY Left 12/2015   surgery   BREAST LUMPECTOMY WITH NEEDLE LOCALIZATION Left 10/02/2015   Procedure: BREAST LUMPECTOMY WITH NEEDLE LOCALIZATION;  Surgeon: [name], MD;  Location: [facility];  Service: General;  Laterality: Left;   BREAST LUMPECTOMY WITH SENTINEL LYMPH NODE BIOPSY Left 10/02/2015   Procedure: LEFT BREAST WIDE EXCISION WITH SENTINEL LYMPH NODE BX;  Surgeon: [name], MD;  Location: [facility];  Service: General;  Laterality: Left;   BREAST SURGERY     breast biopsy benign   CARPAL TUNNEL RELEASE Right 05/19/2017   Procedure: CARPAL TUNNEL RELEASE;  Surgeon: Earnestine Leys, MD;  Location: [facility];   Service: Orthopedics;  Laterality: Right;   CATARACT EXTRACTION W/PHACO Right 10/26/2018   Procedure: CATARACT EXTRACTION PHACO AND INTRAOCULAR LENS PLACEMENT ([place]) right diabetic;  Surgeon: Leandrew Koyanagi, MD;  Location: [place];  Service: Ophthalmology;  Laterality: Right;  Diabetic - oral meds cancer center been notified and will come   CATARACT EXTRACTION W/PHACO Left 11/16/2018   Procedure: CATARACT EXTRACTION PHACO AND INTRAOCULAR LENS PLACEMENT ([facility]) LEFT DIABETIC  01:01.0  17.0%  10.37;  Surgeon: Leandrew Koyanagi, MD;  Location: [place];  Service: Ophthalmology;  Laterality: Left;  Diabetic - oral meds Port-a-cath   CHOLECYSTECTOMY     Cologuard  07/22/2016   Negative   COLONOSCOPY WITH PROPOFOL N/A 05/24/2020   Procedure: COLONOSCOPY WITH PROPOFOL;  Surgeon: Virgel Manifold, MD;  Location: [facility];  Service: Endoscopy;  Laterality: N/A;   ESOPHAGOGASTRODUODENOSCOPY  07/2009   normal with few gastric polyps   ESOPHAGOGASTRODUODENOSCOPY (EGD) WITH PROPOFOL N/A 05/24/2020   Procedure: ESOPHAGOGASTRODUODENOSCOPY (EGD) WITH PROPOFOL;  Surgeon: Virgel Manifold, MD;  Location: [facility];  Service: Endoscopy;  Laterality: N/A;   MOLE REMOVAL     PORT-A-CATH REMOVAL     PORTACATH PLACEMENT     PORTACATH PLACEMENT Right 03/12/2015   Procedure: INSERTION PORT-A-CATH;  Surgeon: [name], MD;  Location: [facility];  Service: General;  Laterality: Right;   RADICAL NECK DISSECTION     TONSILLECTOMY     cancer treated with chemo and radiation   TRIGGER FINGER RELEASE Right 05/19/2017   Procedure: RELEASE TRIGGER FINGER/A-1 PULLEY ring finger;  Surgeon: Earnestine Leys, MD;  Location: [facility];  Service: Orthopedics;  Laterality: Right;   Family History  Problem Relation Age of Onset   Osteoporosis Mother    Hyperlipidemia Mother    Depression Mother    Cancer Mother        skin cancer ? basal cell and lung ca heavy smoker   Alcohol abuse  Father    Cancer Father        skin CA ? basal cell   Heart disease Father        CHF   Hyperlipidemia Brother    Hypertension Brother    Breast cancer Neg Hx    Social History   Socioeconomic History   Marital status: Widowed    Spouse name: Not on file   Number of children: 0   Years of education: 14   Highest education level: Not on file  Occupational History   Occupation: book Therapist, nutritional  Tobacco Use   Smoking status: Former    Packs/day: 0.50    Years: 6.00    Total pack years: 3.00    Types: Cigarettes    Quit date: 03/10/1984    Years since quitting: 37.7   Smokeless tobacco: Never  Vaping Use   Vaping Use: Never used  Substance and Sexual Activity   Alcohol use: No    Alcohol/week: 0.0 standard drinks of alcohol   Drug use: No   Sexual activity: Not Currently  Other Topics Concern   Not on file  Social History Narrative   Lives with husband in a one story home.  No children.        Works as Radiation protection practitioner for [employer].        Education: college.    Social Determinants of Health   Financial Resource Strain: Low Risk  (11/27/2021)   Overall Financial Resource Strain (CARDIA)    Difficulty of Paying Living Expenses: Not hard at all  Food Insecurity: No Food Insecurity (11/27/2021)   Hunger Vital Sign    Worried About Running Out of Food in the Last Year: Never true    Ran Out of Food in the Last Year: Never true  Transportation Needs: No Transportation Needs (11/27/2021)   PRAPARE - Hydrologist (Medical): No    Lack of Transportation (Non-Medical): No  Physical Activity: Insufficiently Active (11/27/2021)   Exercise Vital Sign    Days of Exercise per Week: 3 days    Minutes of Exercise per Session: 30 min  Stress: No Stress Concern Present (11/27/2021)   [place]    Feeling of Stress : Not at all  Social Connections: Moderately Integrated (11/27/2021)    Social Connection and Isolation Panel [NHANES]    Frequency of Communication with Friends and Family: More than three times a week    Frequency of Social Gatherings with Friends and Family: More than three times a week    Attends Religious Services: More than 4 times per year    Active Member of [employer] or Organizations: Yes    Attends Archivist Meetings: More than 4 times per year    Marital Status: Widowed    Tobacco Counseling Counseling given: Not Answered   Clinical Intake:  Pre-visit preparation completed: No  Pain : No/denies painNutrition Risk Assessment:  Has the patient had any N/V/D within the last 2 months?  No  Does the patient have any non-healing wounds?  No  Has the patient had any unintentional weight loss or weight gain?  No   Diabetes:  Is the patient diabetic?  Yes  If diabetic, was a CBG obtained today?  Yes CBG 93 Taken by patient Did the patient bring in their glucometer from home?  No  How often do you monitor your CBG's? Every other day.   Financial Strains and Diabetes Management:  Are you having any financial strains with the device, your supplies or your medication? No .  Does the patient want to be seen by Chronic Care Management for management of their diabetes?  No  Would the patient like to be referred to a Nutritionist or for Diabetic Management?  No   Diabetic Exams:  Diabetic Eye Exam: Completed No. Overdue for diabetic eye exam. Pt has been advised about the importance in completing this exam. A referral has been placed today. Message sent to referral coordinator for scheduling purposes. Advised pt to expect a call from office referred to regarding appt.  Diabetic Foot Exam: Completed No. Pt has been advised about the importance in completing this exam. Pt is scheduled for diabetic foot exam on Followed by Dr Della Goo.       BMI - recorded: 33.73 Nutritional Status: BMI > 30  Obese Nutritional Risks: None Diabetes: Yes CBG  done?: Yes (CBG 93 Taken by patient) CBG resulted in Enter/ Edit results?: Yes Did pt. bring in CBG monitor from home?: No  How often do you need to have someone help you when you read instructions, pamphlets, or other written materials from your doctor or pharmacy?: 1 - Never  Diabetic?  Yes  Interpreter Needed?: No  Information entered by :: Rolene Arbour LPN   Activities of Daily Living    11/27/2021    3:45 PM 12/10/2020    6:55 PM  In your present state of health, do you have any difficulty performing the following activities:  Hearing? 0   Vision? 0   Difficulty concentrating or making decisions? 0   Walking or climbing stairs? 0   Dressing or bathing? 0   Doing errands, shopping? 0 0  Preparing Food and eating ? N   Using the Toilet? N   In the past six months, have you accidently leaked urine? N   Do you have problems with loss of bowel control? N   Managing your Medications? N   Managing your Finances? N   Housekeeping or managing your Housekeeping? N     Patient Care Team: Tower, [name], MD as PCP - General Byrnett, [name], MD (General Surgery) [name], MD (Oncology) Alda Berthold, DO as Consulting Physician (Neurology) Cammie Sickle, MD as Consulting  Physician (Oncology) Noreene Filbert, MD as Radiation Oncologist (Radiation Oncology) Charlton Haws, [facility] as Pharmacist (Pharmacist)  Indicate any recent Medical Services you may have received from other than [facility] providers in the past year (date may be approximate).     Assessment:   This is a routine wellness examination for Diamantina.  Hearing/Vision screen Hearing Screening - Comments:: Denies hearing difficulties   Vision Screening - Comments:: Wears rx glasses - up to date with routine eye exams with    Dietary issues and exercise activities discussed: Current Exercise Habits: Home exercise routine, Type of exercise: walking, Time (Minutes): 30, Frequency (Times/Week): 2,  Weekly Exercise (Minutes/Week): 60, Intensity: Moderate, Exercise limited by: None identified   Goals Addressed               This Visit's Progress     Lose weight (pt-stated)         Depression Screen    11/27/2021    3:45 PM 09/22/2021    9:27 AM 07/04/2021    5:13 PM 11/09/2020   10:33 AM 07/15/2018    8:46 AM 06/29/2017    8:54 AM 06/25/2016    9:43 AM  PHQ 2/9 Scores  PHQ - 2 Score 0 0 0 0 0 0 0  PHQ- 9 Score   2  0 0     Fall Risk    11/27/2021    3:46 PM 09/22/2021    9:27 AM 11/09/2020   10:38 AM 09/12/2019    2:39 PM 08/03/2018    8:23 AM  Fall Risk   Falls in the past year? 1 0 '1 1 1  '$ Comment    Emmi Telephone Survey: data to providers prior to load   Number falls in past yr: 0  '1 1 1  '$ Comment    Emmi Telephone Survey Actual Response = 4   Injury with Fall? 0  0 0 0  Risk for fall due to : No Fall Risks    Impaired balance/gait  Follow up Falls prevention discussed Falls evaluation completed   Falls evaluation completed    [place]:  Any stairs in or around the home? No  If so, are there any without handrails? No  Home free of loose throw rugs in walkways, pet beds, electrical cords, etc? Yes  Adequate lighting in your home to reduce risk of falls? Yes   ASSISTIVE DEVICES UTILIZED TO PREVENT FALLS:  Life alert? Yes  Use of a cane, walker or w/c? No  Grab bars in the bathroom? Yes  Shower chair or bench in shower? Yes  Elevated toilet seat or a handicapped toilet? No   TIMED UP AND GO:  Was the test performed? No . Audio Visit  Cognitive Function:    07/15/2018    9:27 AM 06/29/2017    8:54 AM 06/25/2016    9:43 AM  MMSE - Mini Mental State Exam  Orientation to time '5 5 5  '$ Orientation to Place '5 5 5  '$ Registration '3 3 3  '$ Attention/ Calculation 0 0 0  Recall '3 3 3  '$ Language- name 2 objects 0 0 0  Language- repeat '1 1 1  '$ Language- follow 3 step command 0 3 3  Language- read & follow direction 0 0 0  Write a  sentence 0 0 0  Copy design 0 0 0  Total score '17 20 20        '$ 11/27/2021    3:48 PM 11/09/2020  10:49 AM  6CIT Screen  What Year? 0 points 0 points  What month? 0 points 0 points  What time? 0 points 0 points  Count back from 20 0 points 0 points  Months in reverse 0 points 0 points  Repeat phrase 0 points 0 points  Total Score 0 points 0 points    Immunizations Immunization History  Administered Date(s) Administered   Fluad Quad(high Dose 65+) 11/26/2020   PFIZER(Purple Top)SARS-COV-2 Vaccination 03/30/2019, 04/20/2019, 11/11/2019   Pfizer Covid-19 Vaccine Bivalent Booster 58yr & up 09/25/2020   Pneumococcal Conjugate-13 10/16/2014   Pneumococcal Polysaccharide-23 08/31/2012   Td 06/16/2001   Tdap 06/01/2012    TDAP status: Up to date  Flu Vaccine status: Up to date  Pneumococcal vaccine status: Due, Education has been provided regarding the importance of this vaccine. Advised may receive this vaccine at local pharmacy or Health Dept. Aware to provide a copy of the vaccination record if obtained from local pharmacy or Health Dept. Verbalized acceptance and understanding.  Covid-19 vaccine status: Completed vaccines  Qualifies for Shingles Vaccine? Yes   Zostavax completed No   Shingrix Completed?: No.    Education has been provided regarding the importance of this vaccine. Patient has been advised to call insurance company to determine out of pocket expense if they have not yet received this vaccine. Advised may also receive vaccine at local pharmacy or Health Dept. Verbalized acceptance and understanding.  Screening Tests Health Maintenance  Topic Date Due   FOOT EXAM  07/06/2018   Diabetic kidney evaluation - Urine ACR  02/11/2021   HEMOGLOBIN A1C  06/11/2021   COVID-19 Vaccine (5 - Pfizer risk series) 12/13/2021 (Originally 11/20/2020)   Zoster Vaccines- Shingrix (1 of 2) 02/27/2022 (Originally 12/06/1964)   INFLUENZA VACCINE  05/17/2022 (Originally 09/16/2021)    Pneumonia Vaccine 74 Years old (3 - PPSV23 or PCV20) 11/28/2022 (Originally 08/31/2017)   COLONOSCOPY (Pts 45-444yrInsurance coverage will need to be confirmed)  11/28/2022 (Originally 05/24/2021)   OPHTHALMOLOGY EXAM  05/17/2022   TETANUS/TDAP  06/02/2022   Diabetic kidney evaluation - GFR measurement  11/27/2022   MAMMOGRAM  05/15/2023   DEXA SCAN  Completed   Hepatitis C Screening  Completed   HPV VACCINES  Aged Out   Fecal DNA (Cologuard)  Discontinued    Health Maintenance  Health Maintenance Due  Topic Date Due   FOOT EXAM  07/06/2018   Diabetic kidney evaluation - Urine ACR  02/11/2021   HEMOGLOBIN A1C  06/11/2021    Colorectal cancer screening: Referral to GI placed 11/27/21. Pt aware the office will call re: appt.  Mammogram status: Completed 05/14/21. Repeat every year  Bone Density status: Completed 9//21. Results reflect: Bone density results: OSTEOPOROSIS. Repeat every   years.  Lung Cancer Screening: (Low Dose CT Chest recommended if Age 74-80ears, 30 pack-year currently smoking OR have quit w/in 15years.) does not qualify.     Additional Screening:  Hepatitis C Screening: does qualify; Completed 06/25/16  Vision Screening: Recommended annual ophthalmology exams for early detection of glaucoma and other disorders of the eye. Is the patient up to date with their annual eye exam?  Yes  Who is the provider or what is the name of the office in which the patient attends annual eye exams? [place] pt is not established with a provider, would they like to be referred to a provider to establish care? No .   Dental Screening: Recommended annual dental exams for proper oral hygiene  Community Resource Referral / Chronic Care Management:  CRR required this visit?  No   CCM required this visit?  No      Plan:     I have personally reviewed and noted the following in the patient's chart:   Medical and social history Use of alcohol, tobacco or  illicit drugs  Current medications and supplements including opioid prescriptions. Patient is not currently taking opioid prescriptions. Functional ability and status Nutritional status Physical activity Advanced directives List of other physicians Hospitalizations, surgeries, and ER visits in previous 12 months Vitals Screenings to include cognitive, depression, and falls Referrals and appointments  In addition, I have reviewed and discussed with patient certain preventive protocols, quality metrics, and best practice recommendations. A written personalized care plan for preventive services as well as general preventive health recommendations were provided to patient.     Suzanne Peaches, LPN   [id number]   Nurse Notes: Patient due labs Diabetic Evaluation- Urine ACR and Hemoglobin A1C

## 2021-11-28 ENCOUNTER — Telehealth: Payer: Self-pay

## 2021-11-28 NOTE — Chronic Care Management (AMB) (Unsigned)
Chronic Care Management Pharmacy Assistant   Name: Suzanne Strong  MRN: [id number] DOB: 1945-06-14  Reason for Encounter: Medication Adherence and Delivery Coordination    Recent office visits:  11/27/21-(fam med)-Telemedicine -AWV,discussed screenings,diet and exercise,vaccines,f/u 1 year   Recent consult visits:  11/26/21-Govinda Brahmanday,MD(onco)-f/u breast CA,continue annual surveillance. Reclast today,f/u 12 months  Hospital visits:  None in previous 6 months  Medications: Outpatient Encounter Medications as of 11/28/2021  Medication Sig   acetaminophen (TYLENOL) 325 MG tablet Take 2 tablets (650 mg total) by mouth every 6 (six) hours as needed for mild pain (or Fever >/= 101).   albuterol (VENTOLIN HFA) 108 (90 Base) MCG/ACT inhaler Inhale 2 puffs into the lungs every 6 (six) hours as needed for wheezing or shortness of breath.   ALPRAZolam (XANAX) 0.5 MG tablet TAKE ONE TABLET BY MOUTH daily as needed FOR ANXIETY   amitriptyline (ELAVIL) 50 MG tablet Take 1 tablet (50 mg total) by mouth at bedtime.   amLODipine (NORVASC) 5 MG tablet TAKE ONE TABLET BY MOUTH ONCE DAILY   atorvastatin (LIPITOR) 10 MG tablet TAKE ONE TABLET BY MOUTH ONCE DAILY   b complex vitamins tablet Take 1 tablet by mouth daily.   BIOTIN PO Take by mouth.   busPIRone (BUSPAR) 15 MG tablet Take 1 tablet (15 mg total) by mouth 2 (two) times daily.   Cholecalciferol (VITAMIN D-1000 MAX ST) 25 MCG (1000 UT) tablet Take 2,000 Units by mouth daily. Takes three 50 mcg tablets, 3 capsules daily   Emollient (COLLAGEN EX)    ferrous sulfate 325 (65 FE) MG tablet Take 1 tablet (325 mg total) by mouth 2 (two) times daily with a meal.   glipiZIDE (GLUCOTROL) 5 MG tablet Take 2 tablets (10 mg total) by mouth daily before breakfast.   glucose blood (ONETOUCH ULTRA) test strip USE TO check blood glucose ONCE DAILY AND AS NEEDED FOR DIABETES   metFORMIN (GLUCOPHAGE) 500 MG tablet Take 2 tablets (1,000 mg total) by  mouth 2 (two) times daily with a meal.   Multiple Vitamins-Minerals (WOMENS MULTIVITAMIN PO) Take by mouth.   omeprazole (PRILOSEC) 20 MG capsule TAKE ONE CAPSULE BY MOUTH EVERYDAY AT BEDTIME   pregabalin (LYRICA) 150 MG capsule Take 300 mg by mouth at bedtime.   rOPINIRole (REQUIP) 1 MG tablet TAKE 1/2 TABLET BY MOUTH EVERY MORNING and TAKE THREE TABLETS BY MOUTH EVERYDAY AT BEDTIME   sertraline (ZOLOFT) 100 MG tablet TAKE TWO TABLETS BY MOUTH EVERY MORNING   temazepam (RESTORIL) 15 MG capsule Take 1 capsule (15 mg total) by mouth at bedtime as needed for sleep.   zinc gluconate 50 MG tablet Take 50 mg by mouth daily.   Facility-Administered Encounter Medications as of 11/28/2021  Medication   influenza vaccine adjuvanted (FLUAD) injection 0.5 mL   sodium chloride flush (NS) 0.9 % injection 10 mL   BP Readings from Last 3 Encounters:  11/26/21 (!) 143/78  10/30/21 (!) 109/98  10/21/21 (!) 142/70    Lab Results  Component Value Date   HGBA1C 6.5 (H) 12/11/2020      Recent OV, Consult or Hospital visit:   Telemedicine AWV, Oncology - no medication changes   Last adherence delivery date:11/07/21      Patient is due for next adherence delivery on: 12/09/21  Spoke with patient on 12/01/21 and reviewed medications. Patient denied the need for any medications at this time.  This delivery to include: Adherence Packaging  30 Days  Packs: Pregabalin '150mg'$ - 2  daily (2 bedtime) Omeprazole '20mg'$ - 1 tablet daily (bedtime)  Ropinirole '1mg'$  -  tablet (breakfast) and 3 tablets nightly (bedtime)  Atorvastatin '10mg'$ - 1 tablet daily (breakfast)  Amlodipine '5mg'$ - 1 tablet daily (breakfast)   Amitriptyline '50mg'$ - 1 tablet daily (bedtime)  Alpha lipoic acid '200mg'$  - 1 capsule at( bedtime) Sertraline '100mg'$ -take 2 tablets at (breakfast )  VIAL medications: Alprazolam 0.'5mg'$ - take 1 tablet twice daily as needed   Patient declined the following medications this month: none   Any concerns about  your medications? Yes Patient reports she has a whole box of medications not started  How often do you forget or accidentally miss a dose? Rarely, on occasion if sleeping   Do you use a pillbox? No  Is patient in packaging Yes  If yes  What is the date on your next pill pack? 11/08/21  Any concerns or issues with your packaging?  Has unopened box remaining - Start date on box 11/11/21   No refill request needed.   Annual wellness visit in last year? Yes Most Recent BP reading:143/78  If Diabetic: Most recent A1C reading:7.1 Last eye exam / retinopathy screening:UTD Last diabetic foot exam:2019  Cycle dispensing form sent to [facility], CPP for review.  Charlene Brooke, CPP notified  Avel Sensor, [place]  [phone]

## 2021-12-14 ENCOUNTER — Encounter: Payer: Self-pay | Admitting: Family Medicine

## 2021-12-15 ENCOUNTER — Telehealth: Payer: Self-pay | Admitting: Psychiatry

## 2021-12-15 DIAGNOSIS — F411 Generalized anxiety disorder: Secondary | ICD-10-CM

## 2021-12-15 NOTE — Telephone Encounter (Signed)
Spoke to pt and it was a little difficult to understand her.She stated that temazepam is not helping her sleep and she has been taking 2 xanax even though rx was changed to 1.I advised her not to take extra because it is not a guarantee that we can refill it early if she runs out.Please advise

## 2021-12-15 NOTE — Telephone Encounter (Signed)
Please clarify what she is taking besides 2 alprazolam. Is she taking Temazepam with Alprazolam? If so, please advise her not to combine these 2 medications. Has she been able to complete the paperwork for  the patient assistance for Belsomra yet?

## 2021-12-15 NOTE — Telephone Encounter (Signed)
Pt lvm that the new medicine that she is taking is not working. She is not sleeping . She has to take 2  xanax to help her sleep. Please call her at [phone]

## 2021-12-15 NOTE — Telephone Encounter (Signed)
Unable to reach pt will try again.

## 2021-12-15 NOTE — Telephone Encounter (Signed)
Please schedule appt with first available

## 2021-12-16 NOTE — Telephone Encounter (Signed)
Spoke to pt, scheduled ov for 12/17/2021

## 2021-12-16 NOTE — Telephone Encounter (Signed)
Pt stated she does not take them at the same time and she takes the xanax earlier in the day.She mentioned trazodone helped before but she had to stop it due to balance issues.Pt has not been able to complete paperwork for belsomra.

## 2021-12-16 NOTE — Telephone Encounter (Signed)
LVM to rtc 

## 2021-12-17 ENCOUNTER — Encounter: Payer: Self-pay | Admitting: Family Medicine

## 2021-12-17 ENCOUNTER — Ambulatory Visit (INDEPENDENT_AMBULATORY_CARE_PROVIDER_SITE_OTHER)
Admission: RE | Admit: 2021-12-17 | Discharge: 2021-12-17 | Disposition: A | Discharge status: Home / Self Care | Payer: PPO | Source: Ambulatory Visit | Attending: Family Medicine | Admitting: Family Medicine

## 2021-12-17 ENCOUNTER — Ambulatory Visit (INDEPENDENT_AMBULATORY_CARE_PROVIDER_SITE_OTHER): Payer: PPO | Admitting: Family Medicine

## 2021-12-17 VITALS — BP 140/70 | HR 87 | Temp 97.3°F | Ht 66.0 in | Wt 213.0 lb

## 2021-12-17 DIAGNOSIS — J209 Acute bronchitis, unspecified: Secondary | ICD-10-CM

## 2021-12-17 DIAGNOSIS — R052 Subacute cough: Secondary | ICD-10-CM | POA: Diagnosis not present

## 2021-12-17 DIAGNOSIS — R059 Cough, unspecified: Secondary | ICD-10-CM | POA: Diagnosis not present

## 2021-12-17 DIAGNOSIS — J069 Acute upper respiratory infection, unspecified: Secondary | ICD-10-CM | POA: Diagnosis not present

## 2021-12-17 DIAGNOSIS — J690 Pneumonitis due to inhalation of food and vomit: Secondary | ICD-10-CM | POA: Diagnosis not present

## 2021-12-17 DIAGNOSIS — J189 Pneumonia, unspecified organism: Secondary | ICD-10-CM | POA: Diagnosis not present

## 2021-12-17 MED ORDER — PREDNISONE 10 MG PO TABS: ORAL_TABLET | ORAL | 0 refills | Status: DC

## 2021-12-17 MED ORDER — DOXYCYCLINE HYCLATE 100 MG PO TABS: 100.0000 mg | ORAL_TABLET | Freq: Two times a day (BID) | ORAL | 0 refills | Status: DC

## 2021-12-17 MED ORDER — ALBUTEROL SULFATE HFA 108 (90 BASE) MCG/ACT IN AERS: 2.0000 | INHALATION_SPRAY | Freq: Four times a day (QID) | RESPIRATORY_TRACT | 2 refills | Status: DC | PRN

## 2021-12-17 NOTE — Telephone Encounter (Signed)
Unable to reach pt will try again.

## 2021-12-17 NOTE — Assessment & Plan Note (Signed)
In pt with h/o recurrent asp pna  Neg covid testing  Stable cxr and reassuring exam so this may be viral as well  Px prednisone 30 mg taper Also doxycycline which she tolerates well  Fluids/rest/sympt care  Albuterol , expectorant prn  Watch closely for return of fever  Stay out of work  Disc ER precautions   Update if not starting to improve in a week or if worsening

## 2021-12-17 NOTE — Patient Instructions (Addendum)
Call us or message me when you are better and I will send order for RSV vaccine   Chest xray today -we will call with result  Drink fluids and rest  Use your inhaler   Take prednisone and doxycycline as directed  If worse go to the ER  Update if not starting to improve in a week or if worsening

## 2021-12-17 NOTE — Progress Notes (Signed)
Subjective:    Patient ID: Suzanne Strong, female    DOB: 29-May-1945, 76 y.o.   MRN: [id number]  HPI Pt presents for cough/ wheezing   Wt Readings from Last 3 Encounters:  12/17/21 213 lb (96.6 kg)  11/27/21 209 lb (94.8 kg)  11/26/21 209 lb (94.8 kg)   34.38 kg/m   Has h/o recurrent asp pna   Symptoms started a week ago on 10/25 Low grade temp on/off  Went up to 101 Sunday- no fever since then  Cough - with rattling and some wheezing -this does not get better A lot of wheezing today  Phlegm is not moving/ can feel it  Hurts in back to cough  Very tired and sleeping a lot   Occ chokes with liquids- no more or less than usual     Hoarse voice (more than usual)  Pulse ox 99% today  Is feeling sob even with 01 Using her inhaler more   Otc Tylenol  No cold medicines     DG Chest 2 View  Result Date: 12/17/2021 CLINICAL DATA:  Cough. Upper respiratory infection. Recurrent aspiration pneumonia. EXAM: CHEST - 2 VIEW COMPARISON:  Chest two views 09/22/2021, 08/18/2021; CT chest 07/22/2021 FINDINGS: Cardiac silhouette and mediastinal contours are within normal limits. Moderate chronic bilateral interstitial thickening is unchanged, greatest within the lung bases. Mild left costophrenic angle curvilinear scarring is unchanged from multiple prior radiographs. No new acute airspace opacity is seen. No pleural effusion. Moderate multilevel degenerative disc changes of the thoracic spine. IMPRESSION: Unchanged chronic bilateral interstitial thickening and left basilar scarring. No acute lung process. Electronically Signed   By: Yvonne Kendall M.D.   On: 12/17/2021 13:40    Patient Active Problem List   Diagnosis Date Noted   Plant dermatitis 10/21/2021   At high risk for aspiration 04/23/2021   COVID-19 03/18/2021   Aspiration pneumonia ([place]) 12/09/2020   Dysuria 07/17/2020   Dysphagia    Gastric erythema    Gastric polyp    Encounter for screening colonoscopy    Polyp  of colon    History of aspiration pneumonia 02/15/2020   Diabetes mellitus without complication (HCC)    GERD (gastroesophageal reflux disease)    Hypertension    Urinary urgency 01/05/2020   Grief reaction 09/18/2019   Swallowing dysfunction 07/18/2019   Cough 02/21/2019   Fever, intermittent 09/01/2018   Acute bronchitis 04/25/2018   Pre-syncope 02/07/2018   Orthostatic hypotension 01/26/2018   Episodic weakness 01/26/2018   Medicare annual wellness visit, subsequent 06/29/2017   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   Preoperative examination 04/27/2017   Carpal tunnel syndrome of right wrist 11/13/2016   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/2016   Obstructive sleep apnea 01/01/2016   Carcinoma of upper-outer quadrant of left breast in female, estrogen receptor negative ([place]) 11/21/2015   Hypomagnesemia 08/28/2015   Fever 04/11/2015   Initial Medicare annual wellness visit 10/16/2014   Estrogen deficiency 10/16/2014   Colon cancer screening 10/16/2014   Controlled type 2 diabetes mellitus without complication ([place]) 40/98/1191   RLS (restless legs syndrome) 02/21/2014   Chronic neck pain 07/25/2010   Depression with anxiety 07/07/2010   Routine general medical examination at a health care facility 06/26/2010   B12 deficiency 09/04/2009   Anemia, iron deficiency 08/29/2009   Anxiety state 08/26/2009   GASTROPARESIS 08/26/2009   Vitamin D deficiency 07/19/2008   Disorder of bone and cartilage 07/19/2007   Hyperlipidemia 07/18/2007   EDEMA 07/18/2007  Past Medical History:  Diagnosis Date   Anemia    iron deficiency and B12 deficiency   Anxiety    Breast cancer ([place]) 02/25/2015   upper-outer quadrant of left female breast, Triple Negative. Neo-adjuvant chemo with complete pathologic response.    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   BREAST CYST ASPIRATION     BREAST EXCISIONAL BIOPSY Left 12/2015   surgery   BREAST LUMPECTOMY WITH NEEDLE LOCALIZATION Left 10/02/2015   Procedure: BREAST LUMPECTOMY WITH NEEDLE LOCALIZATION;  Surgeon: [name], MD;  Location: [facility];  Service: General;  Laterality: Left;   BREAST LUMPECTOMY WITH SENTINEL LYMPH NODE BIOPSY Left 10/02/2015   Procedure: LEFT BREAST WIDE EXCISION WITH SENTINEL LYMPH NODE BX;  Surgeon: [name], MD;  Location: [facility];  Service: General;  Laterality: Left;   BREAST SURGERY     breast biopsy benign   CARPAL TUNNEL RELEASE Right 05/19/2017   Procedure: CARPAL TUNNEL RELEASE;  Surgeon: Earnestine Leys, MD;  Location: [facility];  Service: Orthopedics;  Laterality: Right;   CATARACT EXTRACTION W/PHACO Right 10/26/2018   Procedure: CATARACT EXTRACTION PHACO AND INTRAOCULAR LENS PLACEMENT ([place]) right diabetic;  Surgeon: Leandrew Koyanagi, MD;  Location: [facility];  Service: Ophthalmology;  Laterality: Right;  Diabetic - oral meds cancer center been notified and will come   CATARACT EXTRACTION W/PHACO Left 11/16/2018   Procedure: CATARACT EXTRACTION PHACO AND INTRAOCULAR LENS PLACEMENT ([facility]) LEFT DIABETIC  01:01.0  17.0%  10.37;  Surgeon: Leandrew Koyanagi, MD;  Location: [facility];  Service: Ophthalmology;  Laterality: Left;  Diabetic - oral meds Port-a-cath   CHOLECYSTECTOMY     Cologuard  07/22/2016    Negative   COLONOSCOPY WITH PROPOFOL N/A 05/24/2020   Procedure: COLONOSCOPY WITH PROPOFOL;  Surgeon: Virgel Manifold, MD;  Location: [facility];  Service: Endoscopy;  Laterality: N/A;   ESOPHAGOGASTRODUODENOSCOPY  07/2009   normal with few gastric polyps   ESOPHAGOGASTRODUODENOSCOPY (EGD) WITH PROPOFOL N/A 05/24/2020   Procedure: ESOPHAGOGASTRODUODENOSCOPY (EGD) WITH PROPOFOL;  Surgeon: Virgel Manifold, MD;  Location: [facility];  Service: Endoscopy;  Laterality: N/A;   MOLE REMOVAL     PORT-A-CATH REMOVAL     PORTACATH PLACEMENT     PORTACATH PLACEMENT Right 03/12/2015   Procedure: INSERTION PORT-A-CATH;  Surgeon: [name], MD;  Location: [facility];  Service: General;  Laterality: Right;   RADICAL NECK DISSECTION     TONSILLECTOMY     cancer treated with chemo and radiation   TRIGGER FINGER RELEASE Right 05/19/2017   Procedure: RELEASE TRIGGER FINGER/A-1 PULLEY ring finger;  Surgeon: Earnestine Leys, MD;  Location: [facility];  Service: Orthopedics;  Laterality: Right;   Social History   Tobacco Use   Smoking status:  Former    Packs/day: 0.50    Years: 6.00    Total pack years: 3.00    Types: Cigarettes    Quit date: 03/10/1984    Years since quitting: 37.7   Smokeless tobacco: Never  Vaping Use   Vaping Use: Never used  Substance Use Topics   Alcohol use: No    Alcohol/week: 0.0 standard drinks of alcohol   Drug use: No   Family History  Problem Relation Age of Onset   Osteoporosis Mother    Hyperlipidemia Mother    Depression Mother    Cancer Mother        skin cancer ? basal cell and lung ca heavy smoker   Alcohol abuse Father    Cancer Father        skin CA ? basal cell   Heart disease Father        CHF   Hyperlipidemia Brother    Hypertension Brother    Breast cancer Neg Hx    Allergies  Allergen Reactions   Amoxicillin Swelling    REACTION: rash, swelling Has patient had a PCN reaction causing immediate rash, facial/tongue/throat  swelling, SOB or lightheadedness with hypotension: yes Has patient had a PCN reaction causing severe rash involving mucus membranes or skin necrosis: no Has patient had a PCN reaction that required hospitalization: no Has patient had a PCN reaction occurring within the last 10 years: yes If all of the above answers are "NO", then may proceed with Cephalosporin use.  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     ALPRAZolam (XANAX) 0.5 MG tablet TAKE ONE TABLET BY MOUTH daily as needed FOR ANXIETY 30 tablet 5   amitriptyline (ELAVIL) 50 MG tablet Take 1 tablet (50 mg total) by mouth at bedtime. 90 tablet 3   amLODipine (NORVASC) 5 MG tablet TAKE ONE TABLET BY MOUTH ONCE DAILY 90 tablet 3   atorvastatin (LIPITOR) 10 MG tablet TAKE ONE TABLET BY MOUTH ONCE DAILY 90 tablet 3   b complex vitamins tablet Take 1 tablet by mouth daily.     BIOTIN PO Take by mouth.     busPIRone (BUSPAR) 15 MG tablet Take 1 tablet (15 mg total) by mouth 2 (two) times daily. 180 tablet 1   Cholecalciferol (VITAMIN D-1000 MAX ST) 25 MCG (1000 UT) tablet Take 2,000 Units by mouth daily. Takes three 50 mcg tablets, 3 capsules daily     Emollient (COLLAGEN EX)      ferrous sulfate 325 (65 FE) MG tablet Take 1 tablet (325 mg total) by mouth 2 (two) times daily with a meal.     glipiZIDE (GLUCOTROL) 5 MG tablet Take 2 tablets (10 mg total) by mouth daily before breakfast.     glucose blood (ONETOUCH ULTRA) test strip USE TO check blood glucose ONCE DAILY  AND AS NEEDED FOR DIABETES 100 strip 1   metFORMIN (GLUCOPHAGE) 500 MG tablet Take 2 tablets (1,000 mg total) by mouth  2 (two) times daily with a meal.     Multiple Vitamins-Minerals (WOMENS MULTIVITAMIN PO) Take by mouth.     omeprazole (PRILOSEC) 20 MG capsule TAKE ONE CAPSULE BY MOUTH EVERYDAY AT BEDTIME 90 capsule 3   pregabalin (LYRICA) 150 MG capsule Take 300 mg by mouth at bedtime.     rOPINIRole (REQUIP) 1 MG tablet TAKE 1/2 TABLET BY MOUTH EVERY MORNING and TAKE THREE TABLETS BY MOUTH EVERYDAY AT BEDTIME 315 tablet 3   sertraline (ZOLOFT) 100 MG tablet TAKE TWO TABLETS BY MOUTH EVERY MORNING 60 tablet 1   temazepam (RESTORIL) 15 MG capsule Take 1 capsule (15 mg total) by mouth at bedtime as needed for sleep. 30 capsule 5   zinc gluconate 50 MG tablet Take 50 mg by mouth daily.     Current Facility-Administered Medications on File Prior to Visit  Medication Dose Route Frequency Provider Last Rate Last Admin   influenza vaccine adjuvanted (FLUAD) injection 0.5 mL  0.5 mL Intramuscular Once Charlaine Dalton R, MD       sodium chloride flush (NS) 0.9 % injection 10 mL  10 mL Intravenous PRN Cammie Sickle, MD   10 mL at 09/11/15 0845     Review of Systems  Constitutional:  Positive for fatigue. Negative for activity change, appetite change, fever and unexpected weight change.  HENT:  Positive for postnasal drip and voice change. Negative for congestion, ear pain, rhinorrhea, sinus pressure, sore throat and trouble swallowing.   Eyes:  Negative for pain, redness and visual disturbance.  Respiratory:  Positive for cough and wheezing. Negative for shortness of breath.   Cardiovascular:  Negative for chest pain and palpitations.  Gastrointestinal:  Negative for abdominal pain, blood in stool, constipation and diarrhea.  Endocrine: Negative for polydipsia and polyuria.  Genitourinary:  Negative for dysuria, frequency and urgency.  Musculoskeletal:  Negative for arthralgias, back  pain and myalgias.  Skin:  Negative for pallor and rash.  Allergic/Immunologic: Negative for environmental allergies.  Neurological:  Negative for dizziness, syncope and headaches.  Hematological:  Negative for adenopathy. Does not bruise/bleed easily.  Psychiatric/Behavioral:  Negative for decreased concentration and dysphoric mood. The patient is not nervous/anxious.        Objective:   Physical Exam Constitutional:      General: She is not in acute distress.    Appearance: Normal appearance. She is well-developed. She is obese. She is not ill-appearing or diaphoretic.  HENT:     Head: Normocephalic and atraumatic.     Right Ear: Tympanic membrane, ear canal and external ear normal.     Left Ear: Tympanic membrane, ear canal and external ear normal.     Nose: Nose normal.     Mouth/Throat:     Mouth: Mucous membranes are moist.     Pharynx: No oropharyngeal exudate or posterior oropharyngeal erythema.  Eyes:     General:        Right eye: No discharge.        Left eye: No discharge.     Conjunctiva/sclera: Conjunctivae normal.     Pupils: Pupils are equal, round, and reactive to light.  Neck:     Thyroid: No thyromegaly.     Vascular: No carotid bruit or JVD.  Cardiovascular:     Rate and Rhythm: Normal rate and regular rhythm.     Heart sounds: Normal heart sounds.     No gallop.  Pulmonary:     Effort: Pulmonary effort is normal. No respiratory distress.  Breath sounds: Normal breath sounds. No wheezing or rales.     Comments: Harsh bs  Few rhonchi at L mid and low lung field  No sob with speech  Wheeze on forced exp only  No crackles    Abdominal:     General: There is no distension or abdominal bruit.     Palpations: Abdomen is soft.  Musculoskeletal:     Cervical back: Normal range of motion and neck supple.     Right lower leg: No edema.     Left lower leg: No edema.  Lymphadenopathy:     Cervical: No cervical adenopathy.  Skin:    General: Skin is  warm and dry.     Coloration: Skin is not jaundiced or pale.     Findings: No rash.  Neurological:     Mental Status: She is alert.     Coordination: Coordination normal.     Deep Tendon Reflexes: Reflexes are normal and symmetric. Reflexes normal.  Psychiatric:        Mood and Affect: Mood normal.           Assessment & Plan:   Problem List Items Addressed This Visit       Respiratory   Acute bronchitis    In pt with h/o recurrent asp pna  Neg covid testing  Stable cxr and reassuring exam so this may be viral as well  Px prednisone 30 mg taper Also doxycycline which she tolerates well  Fluids/rest/sympt care  Albuterol , expectorant prn  Watch closely for return of fever  Stay out of work  Disc ER precautions   Update if not starting to improve in a week or if worsening           Other   Cough - Primary   Relevant Orders   DG Chest 2 View (Completed)

## 2021-12-17 NOTE — Telephone Encounter (Signed)
Attempted to reach pt to discuss insomnia and response to medications. No answer. Would you please try to reach her to get more information? -Did her balance improve after stopping Trazodone? Does she think her balance was worse when she took 3 tabs of Trazodone instead of 2? Would she like to re-try Trazodone at lower dose if she did not feel unsteady while taking a lower dose? -Does she want to complete the Patient Assistance Application for Belsomra or hold off? A script for Belsomra could be submitted in January to determine if her plan's formulary changed in the new year. -Has she taken anything else that seems to have been helpful for her sleep without causing her to feel unsteady?

## 2021-12-18 MED ORDER — ALPRAZOLAM 0.5 MG PO TABS: 0.5000 mg | ORAL_TABLET | Freq: Two times a day (BID) | ORAL | 2 refills | Status: DC | PRN

## 2021-12-18 NOTE — Telephone Encounter (Signed)
Please advise her to stop Temazepam. Please let her know it is ok to take Xanax 0.5 mg BID to help with sleep for now. Script sent to [facility] for #60. Please cancel remaining RF's on #30. Is she sleeping ok with just Xanax or does she want to try to add back in low dose Trazodone with it? If so, we can send in 50-100 mg of Trazodone for her to try and she will need to stop it if it affects her balance.

## 2021-12-18 NOTE — Telephone Encounter (Signed)
Pt stated balance did improve when she stopped trazodone.She said she rarely took 3 she normally took 2.It did not help her fall asleep but did help her stay asleep.She said nothing helped better then belsomra.She has the paper work and does want to go through with it but said it's taking time to get the stuff required to apply together.She also wants to know if she can keep taking 1 xanax during the day and 1 HS because it is helping her fall sleep.She was on this dose before.She said she is willing to try trazodone at a lower dose.

## 2021-12-18 NOTE — Addendum Note (Signed)
Addended by: Sharyl Nimrod on: 12/18/2021 01:53 PM   Modules accepted: Orders

## 2021-12-18 NOTE — Telephone Encounter (Signed)
Pt stated the xanax calms her down enough to fall asleep and trazodone helps stay asleep.She has some for now

## 2021-12-26 ENCOUNTER — Telehealth: Payer: Self-pay

## 2021-12-26 NOTE — Chronic Care Management (AMB) (Signed)
Chronic Care Management Pharmacy Assistant   Name: Suzanne Strong  MRN: [id number] DOB: [birth year]   Reason for Encounter: Medication Adherence and Delivery Coordination   Recent office visits:  12/17/21-Marne Tower,MD(PCP)-cough,wheezing, negative covid test, Px prednisone 30 mg taper, Also doxycycline which she tolerates well   Recent consult visits:  12/15/21-Jessica Carter,NP(behav.health)- medication instruction change, alprazolam 0.'5mg'$  take 1 tablet twice daily as needed #60.  Hospital visits:  None in previous 6 months  Medications: Outpatient Encounter Medications as of 12/26/2021  Medication Sig   acetaminophen (TYLENOL) 325 MG tablet Take 2 tablets (650 mg total) by mouth every 6 (six) hours as needed for mild pain (or Fever >/= 101).   albuterol (VENTOLIN HFA) 108 (90 Base) MCG/ACT inhaler Inhale 2 puffs into the lungs every 6 (six) hours as needed for wheezing or shortness of breath.   ALPRAZolam (XANAX) 0.5 MG tablet Take 1 tablet (0.5 mg total) by mouth 2 (two) times daily as needed for anxiety.   amitriptyline (ELAVIL) 50 MG tablet Take 1 tablet (50 mg total) by mouth at bedtime.   amLODipine (NORVASC) 5 MG tablet TAKE ONE TABLET BY MOUTH ONCE DAILY   atorvastatin (LIPITOR) 10 MG tablet TAKE ONE TABLET BY MOUTH ONCE DAILY   b complex vitamins tablet Take 1 tablet by mouth daily.   BIOTIN PO Take by mouth.   busPIRone (BUSPAR) 15 MG tablet Take 1 tablet (15 mg total) by mouth 2 (two) times daily.   Cholecalciferol (VITAMIN D-1000 MAX ST) 25 MCG (1000 UT) tablet Take 2,000 Units by mouth daily. Takes three 50 mcg tablets, 3 capsules daily   doxycycline (VIBRA-TABS) 100 MG tablet Take 1 tablet (100 mg total) by mouth 2 (two) times daily.   Emollient (COLLAGEN EX)    ferrous sulfate 325 (65 FE) MG tablet Take 1 tablet (325 mg total) by mouth 2 (two) times daily with a meal.   glipiZIDE (GLUCOTROL) 5 MG tablet Take 2 tablets (10 mg total) by mouth daily before  breakfast.   glucose blood (ONETOUCH ULTRA) test strip USE TO check blood glucose ONCE DAILY AND AS NEEDED FOR DIABETES   metFORMIN (GLUCOPHAGE) 500 MG tablet Take 2 tablets (1,000 mg total) by mouth 2 (two) times daily with a meal.   Multiple Vitamins-Minerals (WOMENS MULTIVITAMIN PO) Take by mouth.   omeprazole (PRILOSEC) 20 MG capsule TAKE ONE CAPSULE BY MOUTH EVERYDAY AT BEDTIME   predniSONE (DELTASONE) 10 MG tablet Take 3 pills once daily by mouth for 3 days, then 2 pills once daily for 3 days, then 1 pill once daily for 3 days and then stop   pregabalin (LYRICA) 150 MG capsule Take 300 mg by mouth at bedtime.   rOPINIRole (REQUIP) 1 MG tablet TAKE 1/2 TABLET BY MOUTH EVERY MORNING and TAKE THREE TABLETS BY MOUTH EVERYDAY AT BEDTIME   sertraline (ZOLOFT) 100 MG tablet TAKE TWO TABLETS BY MOUTH EVERY MORNING   zinc gluconate 50 MG tablet Take 50 mg by mouth daily.   Facility-Administered Encounter Medications as of 12/26/2021  Medication   influenza vaccine adjuvanted (FLUAD) injection 0.5 mL   sodium chloride flush (NS) 0.9 % injection 10 mL    BP Readings from Last 3 Encounters:  12/17/21 (!) 140/70  11/26/21 (!) 143/78  10/30/21 (!) 109/98    Lab Results  Component Value Date   HGBA1C 6.5 (H) 12/11/2020    Recent OV, Consult or Hospital visit:   Patient is due for next adherence delivery on: 01/01/22  Spoke with patient on 12/26/21 reviewed medications and coordinated delivery.  This delivery to include: Adherence Packaging  30 Days  Packs: Pregabalin '150mg'$ - 2 daily (2 bedtime) Omeprazole '20mg'$ - 1 tablet daily (bedtime)  Ropinirole '1mg'$  -  tablet (breakfast) and 3 tablets nightly (bedtime)  Atorvastatin '10mg'$ - 1 tablet daily (breakfast)  Amlodipine '5mg'$ - 1 tablet daily (breakfast)   Amitriptyline '50mg'$ - 1 tablet daily (bedtime)  Alpha lipoic acid '200mg'$  - 1 capsule at( bedtime) Sertraline '100mg'$ -take 2 tablets at (breakfast )   VIAL medications: none  Patient declined  the following medications this month:  Alprazolam 0.'5mg'$ - increase in dose instructions 12/15/21, and patient filled at another pharmacy ,  patient has supply on hand at this time   Any concerns about your medications? No- patient ready to get another 30ds packaging of medication now   How often do you forget or accidentally miss a dose? Never  Do you use a pillbox? No  Is patient in packaging Yes  Any concerns or issues with your packaging? Satisfied    No refill request needed.  Confirmed delivery date of 01/01/22, advised patient that pharmacy will contact them the morning of delivery.  Recent blood pressure readings are as follows:none available   Recent blood glucose readings are as follows:none available   Annual wellness visit in last year? Yes Most Recent BP reading:140/70  12/17/21  If Diabetic: Most recent A1C reading:7.1 Last eye exam / retinopathy screening:UTD Last diabetic foot exam:2019  Cycle dispensing form sent to [facility], CPP for review.   Charlene Brooke, CPP notified  Avel Sensor, [place]  [phone]

## 2021-12-31 ENCOUNTER — Telehealth: Payer: PPO

## 2021-12-31 ENCOUNTER — Telehealth: Payer: Self-pay | Admitting: Pharmacist

## 2021-12-31 NOTE — Telephone Encounter (Signed)
  Chronic Care Management   Outreach Note  12/31/2021 Name: Suzanne Strong MRN: [id number] DOB: [birth year]  Referred by: Tower, [name], MD  Patient had a phone appointment scheduled with clinical pharmacist today.  An unsuccessful telephone outreach was attempted today. The patient was referred to the pharmacist for assistance with medications, care management and care coordination.   Patient will NOT be penalized in any way for missing a CCM appointment. The no-show fee does not apply.  If possible, a message was left to return call to: [phone] or to [facility].  Charlene Brooke, PharmD, BCACP Clinical Pharmacist [facility] [phone]

## 2021-12-31 NOTE — Progress Notes (Deleted)
Chronic Care Management Pharmacy Note  12/31/2021 Name:  Suzanne Strong MRN:  [id number] DOB:  Mar 30, 1945  Summary: CCM F/U visit -Pt endorses compliances with medications as prescribed   Recommendations/Changes made from today's visit: -No med changes; updated med list with Ozempic  Plan: -[facility] will call patient 3 months for BP log -Pharmacist follow up televisit scheduled for 6 months -AWV due 10/2021   Subjective: Suzanne Strong is an 76 y.o. year old female who is a primary patient of Tower, [name], MD.  The CCM team was consulted for assistance with disease management and care coordination needs.    Engaged with patient by telephone for follow up visit in response to provider referral for pharmacy case management and/or care coordination services.   Consent to Services:  The patient was given information about Chronic Care Management services, agreed to services, and gave verbal consent prior to initiation of services.  Please see initial visit note for detailed documentation.   Patient Care Team: Tower, [name], MD as PCP - General Byrnett, [name], MD (General Surgery) [name], MD (Oncology) Alda Berthold, DO as Consulting Physician (Neurology) Cammie Sickle, MD as Consulting Physician (Oncology) Noreene Filbert, MD as Radiation Oncologist (Radiation Oncology) Charlton Haws, [facility] as Pharmacist (Pharmacist)  Recent office visits: 12/17/21 Dr Glori Bickers OV: acute bronchitis - rx doxycyline, prednisone  10/21/21 Dr Glori Bickers OV:  03/25/21 Dr Glori Bickers OV: COVID f/u - rx cefdinir, zpak, prednisone. 03/18/21 Dr Glori Bickers OV: COVID positive - Paxlovid.  Recent consult visits: 12/15/21 Psych TE - advised to stop temazepam. Ok to take xanax BID. Can resume trazodone. Pt did not do Belsomra PAP ([employer])  11/26/21 Dr Rogue Bussing (Oncology): Breast cancer surveillance. Switch to annual.   11/03/21 NP Thayer Headings (Psych): taking alprazolam and  trazodone 150 mg, which may be contributing to recent falls. Advised re-applying for Belsomra PAP. Rx temazepam to use in place of alprazolam at night. Reduced alprazolam quantity to #30. Refill trazodone PRN. Goal: get on Belsomra or temazepam, and reduce alprazolam and trazodone.  08/27/21 Dr Gabriel Carina (Endocrine): A1c 7.1%. Continue metformin and glipizide.  Hospital visits: 10/30/21 ED visit [facility]): dizziness, weakness. LWBS after triage.   Objective:  Lab Results  Component Value Date   CREATININE 0.84 11/26/2021   BUN 19 11/26/2021   GFR 57.18 (L) 08/18/2021   GFRNONAA >60 11/26/2021   GFRAA >60 10/20/2019   NA 140 11/26/2021   K 4.2 11/26/2021   CALCIUM 8.8 (L) 11/26/2021   CO2 30 11/26/2021   GLUCOSE 134 (H) 11/26/2021    Lab Results  Component Value Date/Time   HGBA1C 6.5 (H) 12/11/2020 03:42 AM   HGBA1C 6.7 (H) 05/28/2020 10:38 PM   HGBA1C 7.3 12/09/2016 12:00 AM   GFR 57.18 (L) 08/18/2021 12:18 PM   GFR 61.97 12/27/2020 01:58 PM    Last diabetic Eye exam:  Lab Results  Component Value Date/Time   HMDIABEYEEXA No Retinopathy 05/16/2021 12:00 AM    Last diabetic Foot exam: No results found for: "HMDIABFOOTEX"   Lab Results  Component Value Date   CHOL 121 02/16/2020   HDL 48 02/16/2020   LDLCALC 57 02/16/2020   LDLDIRECT 97.0 06/29/2017   TRIG 78 02/16/2020   CHOLHDL 2.5 02/16/2020       Latest Ref Rng & Units 11/26/2021   12:55 PM 10/30/2021    3:42 PM 08/18/2021   12:18 PM  Hepatic Function  Total Protein 6.5 - 8.1 g/dL 7.4  7.4  7.3   Albumin 3.5 - 5.0 g/dL 3.5  3.5  3.7   AST 15 - 41 U/L _0 ALT 0 - 44 U/L _1 Alk Phosphatase 38 - 126 U/L 78  69  60   Total Bilirubin 0.3 - 1.2 mg/dL 0.1  0.5  0.4     Lab Results  Component Value Date/Time   TSH 2.57 06/18/2020 11:04 AM   TSH 2.50 07/15/2018 09:27 AM       Latest Ref Rng & Units 11/26/2021   12:55 PM 10/30/2021    3:42 PM 08/18/2021   12:18 PM  CBC  WBC 4.0 - 10.5 K/uL 5.4   11.9  7.9   Hemoglobin 12.0 - 15.0 g/dL 10.6  11.2  11.0   Hematocrit 36.0 - 46.0 % 34.0  36.2  33.0   Platelets 150 - 400 K/uL 275  239  261.0     Lab Results  Component Value Date/Time   VD25OH 38.72 06/18/2020 11:04 AM   VD25OH 29.74 (L) 07/15/2018 09:27 AM    Clinical ASCVD: No  The ASCVD Risk score (Arnett DK, et al., 2019) failed to calculate for the following reasons:   The valid total cholesterol range is 130 to 320 mg/dL       11/27/2021    3:45 PM 09/22/2021    9:27 AM 07/04/2021    5:13 PM  Depression screen PHQ 2/9  Decreased Interest 0 0 0  Down, Depressed, Hopeless 0 0 0  PHQ - 2 Score 0 0 0  Altered sleeping   1  Tired, decreased energy   1  Change in appetite   0  Feeling bad or failure about yourself    0  Trouble concentrating   0  Moving slowly or fidgety/restless   0  Suicidal thoughts   0  PHQ-9 Score   2  Difficult doing work/chores   Not difficult at all     Social History   Tobacco Use  Smoking Status Former   Packs/day: 0.50   Years: 6.00   Total pack years: 3.00   Types: Cigarettes   Quit date: 03/10/1984   Years since quitting: 37.8  Smokeless Tobacco Never   BP Readings from Last 3 Encounters:  12/17/21 (!) 140/70  11/26/21 (!) 143/78  10/30/21 (!) 109/98   Pulse Readings from Last 3 Encounters:  12/17/21 87  11/26/21 81  10/30/21 94   Wt Readings from Last 3 Encounters:  12/17/21 213 lb (96.6 kg)  11/27/21 209 lb (94.8 kg)  11/26/21 209 lb (94.8 kg)   BMI Readings from Last 3 Encounters:  12/17/21 34.38 kg/m  11/27/21 33.73 kg/m  11/26/21 33.73 kg/m    Assessment/Interventions: Review of patient past medical history, allergies, medications, health status, including review of consultants reports, laboratory and other test data, was performed as part of comprehensive evaluation and provision of chronic care management services.   SDOH:  (Social Determinants of Health) assessments and interventions performed: No -  assessed 10/2020 at [place] from 11/27/2021 in [place] at [facility] from 05/31/2019 in [place] at [place] from 07/15/2018 in [place] at [place] Interventions Intervention Not Indicated -- --  Housing Interventions Intervention Not Indicated -- --  Transportation Interventions Intervention Not Indicated -- --  Utilities Interventions Intervention Not Indicated -- --  Alcohol Usage Interventions Intervention Not Indicated (Score <7) -- --  Depression Interventions/Treatment  -- -- UUV2-5 Score <4 Follow-up Not Indicated  Financial Strain Interventions Intervention Not Indicated --  [patient medication assistance evaluation] --  Stress Interventions Intervention Not Indicated -- --  Social Connections Interventions Intervention Not Indicated -- --      SDOH Screenings   Food Insecurity: No Food Insecurity (11/27/2021)  Housing: Low Risk  (11/27/2021)  Transportation Needs: No Transportation Needs (11/27/2021)  Utilities: Not At Risk (11/27/2021)  Alcohol Screen: Low Risk  (11/27/2021)  Depression (PHQ2-9): Low Risk  (11/27/2021)  Financial Resource Strain: Low Risk  (11/27/2021)  Physical Activity: Insufficiently Active (11/27/2021)  Social Connections: Moderately Integrated (11/27/2021)  Stress: No Stress Concern Present (11/27/2021)  Tobacco Use: Medium Risk (12/17/2021)    CCM Care Plan  Allergies  Allergen Reactions   Amoxicillin Swelling    REACTION: rash, swelling Has patient had a PCN reaction causing immediate rash, facial/tongue/throat swelling, SOB or lightheadedness with hypotension: yes Has patient had a PCN reaction causing severe rash involving mucus membranes or skin necrosis: no Has patient had a PCN reaction that required hospitalization: no Has patient had a PCN reaction occurring within the  last 10 years: yes If all of the above answers are "NO", then may proceed with Cephalosporin use.  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 Sig Documenting Provider Last Dose Status Informant  acetaminophen (TYLENOL) 325 MG tablet [id number] Yes Take 2 tablets (650 mg total) by mouth every 6 (six) hours as needed for mild pain (or Fever >/= 101). Domenic Polite, MD Taking Active Self  albuterol (VENTOLIN HFA) 108 (90 Base) MCG/ACT inhaler [id number] Yes Inhale 2 puffs into the lungs every 6 (six) hours as needed for wheezing or shortness of breath. Nena Polio, MD Taking Active   ALPRAZolam Duanne Moron) 0.5 MG tablet [id number] Yes TAKE ONE TABLET BY MOUTH daily as needed FOR ANXIETY Thayer Headings, PMHNP Taking Active   amitriptyline (ELAVIL) 50 MG tablet [id number] Yes Take 1 tablet (50 mg total) by mouth at bedtime. Cammie Sickle, MD Taking Active Self  amLODipine (NORVASC) 5 MG tablet [id number] Yes TAKE ONE TABLET BY MOUTH ONCE DAILY Tower, [name], MD Taking Active   atorvastatin (LIPITOR) 10 MG tablet [id number] Yes TAKE ONE TABLET BY MOUTH ONCE DAILY Tower, [name], MD Taking Active   b complex vitamins tablet [id number] Yes Take 1 tablet by mouth daily. [provider] Taking Active Self  BIOTIN PO [id number] Yes Take by mouth. [provider] Taking Active   busPIRone (BUSPAR) 15 MG tablet [id number] Yes Take 1 tablet (15 mg total) by mouth 2 (two) times daily. Thayer Headings, PMHNP Taking Active   Cholecalciferol (VITAMIN D-1000 MAX ST) 25 MCG (1000 UT) tablet [id number] Yes  Take 2,000 Units by mouth daily. Takes three 50 mcg tablets, 3 capsules daily [provider] Taking Active Self  Emollient ([place]) [id number] Yes  [provider] Taking Active Self  ferrous sulfate 325 (65 FE) MG tablet [id number] Yes Take 1 tablet (325 mg total) by mouth 2 (two) times daily with a meal. Domenic Polite, MD Taking Active Self  glipiZIDE (GLUCOTROL) 5 MG tablet [id number] Yes Take 2 tablets (10 mg total) by mouth daily before breakfast. Tonia Ghent, MD Taking Active   glucose blood [facility] ULTRA) test strip [id number] Yes USE TO check blood glucose ONCE DAILY AND AS NEEDED FOR DIABETES Tower, [name], MD Taking Active Self  influenza vaccine adjuvanted (FLUAD) injection 0.5 mL [id number]   Cammie Sickle, MD  Active   metFORMIN (GLUCOPHAGE) 500 MG tablet [id number] Yes Take 2 tablets (1,000 mg total) by mouth 2 (two) times daily with a meal. Tonia Ghent, MD Taking Active   Multiple Vitamins-Minerals (WOMENS MULTIVITAMIN PO) [id number] Yes Take by mouth. [provider] Taking Active Self  omeprazole (PRILOSEC) 20 MG capsule [id number] Yes TAKE ONE CAPSULE BY MOUTH EVERYDAY AT BEDTIME Tower, [name], MD Taking Active   pregabalin (LYRICA) 150 MG capsule [id number] Yes Take 300 mg by mouth at bedtime. [provider] Taking Active Self  rOPINIRole (REQUIP) 1 MG tablet [id number] Yes TAKE 1/2 TABLET BY MOUTH EVERY MORNING and TAKE THREE TABLETS BY MOUTH EVERYDAY AT BEDTIME Tower, [name], MD Taking Active   sertraline (ZOLOFT) 100 MG tablet [id number] Yes TAKE TWO TABLETS BY MOUTH EVERY MORNING Thayer Headings, PMHNP Taking Active   sodium chloride flush (NS) 0.9 % injection 10 mL [id number]   Cammie Sickle, MD  Active   temazepam (RESTORIL) 15 MG capsule [id number] Yes Take 1 capsule (15 mg total) by mouth at bedtime as needed for sleep. Thayer Headings, PMHNP Taking Active   zinc gluconate 50 MG tablet [id number] Yes Take 50 mg by  mouth daily. [provider] Taking Active             Patient Active Problem List   Diagnosis Date Noted   Plant dermatitis 10/21/2021   At high risk for aspiration 04/23/2021   COVID-19 03/18/2021   Aspiration pneumonia ([place]) 12/09/2020   Dysuria 07/17/2020   Dysphagia    Gastric erythema    Gastric polyp    Encounter for screening colonoscopy    Polyp of colon    History of aspiration pneumonia 02/15/2020   Diabetes mellitus without complication ([place])    GERD (gastroesophageal reflux disease)    Hypertension    Urinary urgency 01/05/2020   Grief reaction 09/18/2019   Swallowing dysfunction 07/18/2019   Cough 02/21/2019   Fever, intermittent 09/01/2018   Acute bronchitis 04/25/2018   Pre-syncope 02/07/2018   Orthostatic hypotension 01/26/2018   Episodic weakness 01/26/2018   Medicare annual wellness visit, subsequent 06/29/2017   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   Preoperative examination 04/27/2017   Carpal tunnel syndrome of right wrist 11/13/2016   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[place]) History of  02/17/2016   Obstructive sleep apnea 01/01/2016   Carcinoma of upper-outer quadrant of left breast in female, estrogen receptor negative ([place]) 11/21/2015   Hypomagnesemia 08/28/2015   Fever 04/11/2015   Initial Medicare annual wellness visit 10/16/2014   Estrogen deficiency 10/16/2014   Colon cancer screening 10/16/2014   Controlled type 2 diabetes mellitus without complication ([place]) 27/78/2423   RLS (restless legs syndrome) 02/21/2014   Chronic neck pain 07/25/2010   Depression with anxiety 07/07/2010   Routine general medical examination at a health care facility 06/26/2010   B12 deficiency 09/04/2009   Anemia, iron deficiency 08/29/2009  Anxiety state 08/26/2009   GASTROPARESIS 08/26/2009   Vitamin D deficiency 07/19/2008   Disorder of bone and cartilage 07/19/2007   Hyperlipidemia 07/18/2007   EDEMA 07/18/2007    Immunization History   Administered Date(s) Administered   Fluad Quad(high Dose 65+) 11/26/2020   PFIZER(Purple Top)SARS-COV-2 Vaccination 03/30/2019, 04/20/2019, 11/11/2019   Pfizer Covid-19 Vaccine Bivalent Booster 24yr & up 09/25/2020   Pneumococcal Conjugate-13 10/16/2014   Pneumococcal Polysaccharide-23 08/31/2012   Td 06/16/2001   Tdap 06/01/2012    Conditions to be addressed/monitored:  Hypertension, Hyperlipidemia, and Diabetes  There are no care plans that you recently modified to display for this patient.     Medication Assistance: None required.  Patient affirms current coverage meets needs.  Compliance/Adherence/Medication fill history: Care Gaps: Foot exam (due 07/06/18)  Star-Rating Drugs: Atorvastatin 10 mg - PDC 73% Metformin - PDC 68% (LF 08/27/21 x 90 ds) Glipizide - PDC 87%  Medication Access: Within the past 30 days, how often has patient missed a dose of medication? *** Is a pillbox or other method used to improve adherence? {YES/NO:21197} Factors that may affect medication adherence? {CHL DESC; BARRIERS:21522} Are meds synced by current pharmacy? {YES/NO:21197} Are meds delivered by current pharmacy? {YES/NO:21197} Does patient experience delays in picking up medications due to transportation concerns? {YES/NO:21197}  [facility] Reviewed: Is patient disadvantaged to use [facility]?: {YES/NO:21197} Current Rx insurance plan: *** [place]  [place] [place] [address]Phone: [phone]: [phone] [facility] [place] [place]Phone: [phone]: [phone] [facility] services reviewed with patient today?: {YES/NO:21197} Packs: Delivery 01/01/22 Pregabalin 1532m 2 daily (2 bedtime) Omeprazole 2041m1 tablet daily (bedtime)  Ropinirole 1mg33m  tablet (breakfast) and 3 tablets nightly (bedtime)  Atorvastatin 10mg54mtablet daily (breakfast)  Amlodipine 5mg- 22mablet daily (breakfast)   Amitriptyline 50mg- 39mblet daily (bedtime)  Alpha lipoic acid 200mg - 50mpsule at( bedtime) Sertraline 100mg-tak28mtablets at (breakfast )    VIAL medications: none   Patient declined the following medications this month: Alprazolam 0.5mg-  pat49mt has supply on hand at this time [facility] ds) Metformin - [facility] ([place] x 90 ds)  Care Plan and Follow Up Patient Decision:  Patient agrees to Care Plan and Follow-up.  Plan: Telephone follow up appointment with care management team member scheduled for:  6 months  Dorotea Hand FoCharlene BrookeBCACP Clinical Pharmacist [place] [phone] Barriers:  Unable to independently monitor therapeutic efficacy  Pharmacist Clinical Goal(s):  Patient will achieve adherence to monitoring guidelines and medication adherence to achieve therapeutic efficacy through collaboration with PharmD and provider.   Interventions: 1:1 collaboration with Tower, Marne A, MWynelle Fannyding development and update of comprehensive plan of care as evidenced by provider attestation and co-signature Inter-disciplinary care team collaboration (see longitudinal plan of care) Comprehensive medication review performed; medication list updated in electronic medical record  Hypertension (BP goal <130/80) -Unknown control - pt is not checking BP at home;  -Hx orthostatic hypotension, presyncopal episodes -Hx OSA (sleep study 8/17 mild/moderate - pt refused CPAP) -Current treatment: Amlodipine 5 mg daily - Appropriate, Effective, Safe, Accessible -Educated on BP goals and benefits of medications for prevention of heart attack, stroke and  kidney damage; Importance of home blood pressure monitoring; -Counseled to monitor BP at home  daily, document, and provide log at future appointments -Recommended to continue current medication  Hyperlipidemia: (LDL goal < 70) -Query controlled - LDL 57 (01/2020), due for re-check -Current treatment: Atorvastatin 10 mg daily - Appropriate, Effective, Safe, Accessible -Educated on Cholesterol goals; Benefits of statin for ASCVD risk reduction; -Recommended to continue current medication  Diabetes (A1c goal <7%) -Controlled - A1c 6.5% (11/2021) at goal; pt endorses compliance with medications -Pt follows with [facility] -Current home glucose readings fasting glucose: 90-110s -Current medications: Glipizide 5 mg daily -Appropriate, Effective, Safe, Accessible Metformin 500 mg - 2 tab BID - Appropriate, Effective, Safe, Accessible -Medications previously tried: Janumet (cost), metformin (diarrhea), Ozempic (cost) -Educated on A1c and blood sugar goals; Benefits of routine self-monitoring of blood sugar; -Counseled to check feet daily and get yearly eye exams -Recommended to continue current medication  Depression/Anxiety (Goal: manage symptoms) -Controlled -PHQ9: 0 - minimal depression -Connected with Wayne Both, PMHNP for mental health support -Current treatment: Sertraline 100 mg - 2 tab daily -Appropriate, Effective, Safe, Accessible Alprazolam 0.5 mg BID prn -Appropriate, Effective, Safe, Accessible Buspirone 15 mg BID -Appropriate, Effective, Safe, Accessible Amitriptyline 50 mg HS -Appropriate, Effective, Safe, Accessible Trazodone 50 mg - 2 tab HS -Appropriate, Effective, Safe, Accessible -Medications previously tried/failed: citalopram, duloxetine, mirtazapine -Educated on Benefits of medication for symptom control -Recommended to continue current medication  Neuropathy / RLS (Goal: manage symptoms) -Controlled - per pt report -Current treatment  Pregabalin 150 mg - 2 cap HS -Appropriate, Effective, Safe, Accessible Ropinirole 1 mg - 1/2 tab AM, 3 tab HS  -Appropriate, Effective, Safe, Accessible Ferrous sulfate 325 mg daily -Appropriate, Effective, Safe, Accessible -Recommended to continue current medication  Health Maintenance -Vaccine gaps: Pneumovax, Shingrix, covid booster -Current therapy:  Zinc 50 mg  -Recommended to continue current medication  Patient Goals/Self-Care Activities Patient will:  - take medications as prescribed as evidenced by patient report and record review focus on medication adherence by pill packs check glucose daily, document, and provide at future appointments check blood pressure daily, document, and provide at future appointments

## 2021-12-31 NOTE — Telephone Encounter (Signed)
Per review glipizide and metformin and not filled through [facility] but through [facility].  [facility]ide - [facility] (LF 12/15/21 x 90 ds) Metformin - [facility] at [facility] (LF 08/27/21 x 90 ds)

## 2022-01-02 NOTE — Telephone Encounter (Signed)
Contacted the patient and she states she wants to wait for the new year 2024. Patient states she will be out of the donut hole then  and hopefully be able to be on another medication and not have to take glipizide or metformin.   Charlene Brooke, CPP notified  Avel Sensor, [place]  [phone]

## 2022-01-14 ENCOUNTER — Telehealth: Payer: Self-pay | Admitting: Psychiatry

## 2022-01-14 DIAGNOSIS — F411 Generalized anxiety disorder: Secondary | ICD-10-CM

## 2022-01-14 NOTE — Telephone Encounter (Signed)
Pt LVM @ 11:05a.  She said she anxious, antsy and can't sit still.  She would like someone to call her back.  Next appt 3/18

## 2022-01-14 NOTE — Telephone Encounter (Signed)
Pt stated her restless legs are worse during the day.She is also jittery and anxious through out the day and is waking up 2-3 times during the night.She is not taking temazepam anymore but still takes trazodone and occasionally xanax to help sleep.

## 2022-01-15 ENCOUNTER — Telehealth: Payer: Self-pay

## 2022-01-15 MED ORDER — BUSPIRONE HCL 15 MG PO TABS: 22.5000 mg | ORAL_TABLET | Freq: Two times a day (BID) | ORAL | 0 refills | Status: DC

## 2022-01-15 NOTE — Telephone Encounter (Signed)
Please try and call again to schedule an appt

## 2022-01-15 NOTE — Telephone Encounter (Signed)
LVM to rtc 

## 2022-01-15 NOTE — Telephone Encounter (Signed)
Pt informed

## 2022-01-15 NOTE — Telephone Encounter (Signed)
Recommend increasing Buspar to 1.5 tabs twice daily to help with anxiety. Please let her know that it may take a couple of weeks before she notices an improvement in anxiety. Recommend contacting her PCP about restless legs since PCP is managing meds for RLS. Sleep would likely improve if RLS improves.

## 2022-01-15 NOTE — Addendum Note (Signed)
Addended by: Sharyl Nimrod on: 01/15/2022 04:08 PM   Modules accepted: Orders

## 2022-01-15 NOTE — Telephone Encounter (Signed)
She said she takes both daily

## 2022-01-15 NOTE — Telephone Encounter (Signed)
Tried calling both home phone number and mobile number, no answer.  Pt needs an appointment to be evaluated.    [place] [facility] - Client TELEPHONE ADVICE RECORD [facility] Patient Name: Suzanne Strong Gender: Female DOB: 02-10-1946 Age: 76 Y 57 M 10 D Return Phone Number: [phone] (Primary) Address: City/ State/ Zip: [place]  [address] Client [facility] - Client Client Site [place] Provider Tower, Roque Lias - MD Contact Type Call Who Is Calling Patient / Member / Family / Caregiver Call Type Triage / Clinical Relationship To Patient Self Return Phone Number [phone] (Primary) Chief Complaint Sore Throat Reason for Call Symptomatic / Request for [place] has a temp 100.6, bad, dry cough, and sore throat. Translation No Nurse Assessment Nurse: Velta Addison, RN, Crystal Date/Time (Eastern Time): 01/15/2022 3:59:09 PM Confirm and document reason for call. If symptomatic, describe symptoms. ---Caller has had a temp 100.6 the highest but has not had fever for past two days. States she has a bad, dry cough, and sore throat. States she had pneumonia earlier this year. Started symptoms Sunday after Thanksgiving. Does the patient have any new or worsening symptoms? ---Yes Will a triage be completed? ---Yes Related visit to physician within the last 2 weeks? ---No Does the PT have any chronic conditions? (i.e. diabetes, asthma, this includes High risk factors for pregnancy, etc.) ---Yes List chronic conditions. ---HTN, Cancer (breast, tonsillar) Is this a behavioral health or substance abuse call? ---No Guidelines Guideline Title Affirmed Question Affirmed Notes Nurse Date/Time (Eastern Time) Sore Throat Diabetes mellitus or weak immune system (e.g., HIV positive, cancer chemo, splenectomy, organ transplant, chronic steroids) Parrott, RN, [place]  01/15/2022 4:01:37 PM PLEASE NOTE: All timestamps contained within this report are represented as [place] Standard Time. CONFIDENTIALTY NOTICE: This fax transmission is intended only for the addressee. It contains information that is legally privileged, confidential or otherwise protected from use or disclosure. If you are not the intended recipient, you are strictly prohibited from reviewing, disclosing, copying using or disseminating any of this information or taking any action in reliance on or regarding this information. If you have received this fax in error, please notify us immediately by telephone so that we can arrange for its return to [place]. Phone: [phone], Toll-Free: [phone], Fax: [phone] Page: 2 of 2 Call Id: [id number] [place]. Time Eilene Ghazi Time) Disposition Final User 01/15/2022 4:09:00 PM See PCP within 24 Hours Yes Velta Addison, RN, Crystal Final Disposition 01/15/2022 4:09:00 PM See PCP within 24 Hours Yes Parrott, RN, Interior and spatial designer Understands Yes PreDisposition Call Doctor Care Advice Given Per Guideline SEE PCP WITHIN 24 HOURS: * IF OFFICE WILL BE OPEN: You need to be examined within the next 24 hours. Call your doctor (or NP/PA) when the office opens and make an appointment. CARE ADVICE given per Sore Throat (Adult) guideline. CALL BACK IF: * You become worse Comments User: Hamilton Capri, RN Date/Time (Eastern Time): 01/15/2022 4:03:55 PM States she was exposed to family that had covid prior to Thanksgiving and they were symptom free when she interacted with them. States she took a covid test and it was negative. Tested yesterday (Tue) and Monday User: Hamilton Capri, RN Date/Time Eilene Ghazi Time): 01/15/2022 4:09:52 PM Per guideline, advised the caller to notify the office that she had spoken with triage nurse. I did connect her to the line and then disconnect Referrals REFERRED TO PCP OFFICE

## 2022-01-15 NOTE — Telephone Encounter (Signed)
Please ask her if she is still taking Pregabalin (Lyrica). It looks like this was last filled 11/07/21. Lyrica can help with restless legs and anxiety, so if she has been out of Lyrica this could be cause for recent symptoms. Also, is she still taking Ropinirole?

## 2022-01-16 ENCOUNTER — Ambulatory Visit (INDEPENDENT_AMBULATORY_CARE_PROVIDER_SITE_OTHER): Payer: PPO | Admitting: Family Medicine

## 2022-01-16 ENCOUNTER — Encounter: Payer: Self-pay | Admitting: Family Medicine

## 2022-01-16 ENCOUNTER — Ambulatory Visit (INDEPENDENT_AMBULATORY_CARE_PROVIDER_SITE_OTHER)
Admission: RE | Admit: 2022-01-16 | Discharge: 2022-01-16 | Disposition: A | Discharge status: Home / Self Care | Payer: PPO | Source: Ambulatory Visit | Attending: Family Medicine | Admitting: Family Medicine

## 2022-01-16 VITALS — BP 142/66 | HR 94 | Temp 97.8°F | Ht 66.0 in | Wt 214.2 lb

## 2022-01-16 DIAGNOSIS — R059 Cough, unspecified: Secondary | ICD-10-CM | POA: Diagnosis not present

## 2022-01-16 DIAGNOSIS — J209 Acute bronchitis, unspecified: Secondary | ICD-10-CM

## 2022-01-16 DIAGNOSIS — R509 Fever, unspecified: Secondary | ICD-10-CM | POA: Diagnosis not present

## 2022-01-16 LAB — POCT INFLUENZA A/B
Influenza A, POC: NEGATIVE
Influenza B, POC: NEGATIVE

## 2022-01-16 LAB — POC COVID19 BINAXNOW: SARS Coronavirus 2 Ag: NEGATIVE

## 2022-01-16 MED ORDER — PREDNISONE 10 MG PO TABS: ORAL_TABLET | ORAL | 0 refills | Status: DC

## 2022-01-16 MED ORDER — AZITHROMYCIN 250 MG PO TABS: ORAL_TABLET | ORAL | 0 refills | Status: DC

## 2022-01-16 MED ORDER — HYDROCOD POLI-CHLORPHE POLI ER 10-8 MG/5ML PO SUER: 5.0000 mL | Freq: Two times a day (BID) | ORAL | 0 refills | Status: DC | PRN

## 2022-01-16 MED ORDER — CEFDINIR 300 MG PO CAPS: 300.0000 mg | ORAL_CAPSULE | Freq: Two times a day (BID) | ORAL | 0 refills | Status: DC

## 2022-01-16 NOTE — Patient Instructions (Signed)
Take the cefdinir and zithromax  Prednisone as directed  Tussionex  Mucinex to loosen congestion if needed Drink lots of fluids  Chest xray now   If you have more wheezing or shortness of breath let us know   If severe go to the ER   Update if not starting to improve in a week or if worsening

## 2022-01-16 NOTE — Telephone Encounter (Signed)
Pt already has appt scheduled with Dr Glori Bickers on 01/16/22 at 2:30 pm. Thank you. Sending as Juluis Rainier to Dr Glori Bickers.

## 2022-01-16 NOTE — Progress Notes (Unsigned)
   Subjective:    Patient ID: Suzanne Strong, female    DOB: 1946/01/05, 76 y.o.   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

## 2022-01-18 NOTE — Assessment & Plan Note (Signed)
Worsening cough/wheeze and fever with 1 wk of uri in pt with high risk for asp pna Cxr ordered- reassuring but some prominence of interstitial markings (in setting of scarring)  Neg flu and covid testing  Cover with cefdinir and zithromax Tussionex for cough  Prednisone for wheezing  Fluids/rest Expectorant if needed  Will monitor closely  Disc sympt care and ER precautions

## 2022-01-19 ENCOUNTER — Telehealth: Payer: Self-pay | Admitting: *Deleted

## 2022-01-19 NOTE — Telephone Encounter (Signed)
Pt notified of Dr. Marliss Coots comments. F/u appt scheduled with PCP, pt will cancel if she starts to feel better

## 2022-01-19 NOTE — Telephone Encounter (Signed)
Left VM requesting pt to call the office back 

## 2022-01-19 NOTE — Telephone Encounter (Signed)
-----   Message from [name], MD sent at 01/18/2022 10:18 AM EST ----- Please call and check in on her/how is she feeling?

## 2022-01-19 NOTE — Telephone Encounter (Signed)
Pt called back, she is feeling the exact same as when she saw PCP. Pt said she isn't feeling any better at all and has the exact same sxs. Pt said she is coughing really bad especially in the afternoon and at night. Pt said she isn't coughing up any phlegm it's just a dry hacky cough. Pt is taking all the meds PCP prescribed but still no improvement and is feeling pretty fatigued also. No fever no new sxs just no improvement

## 2022-01-19 NOTE — Telephone Encounter (Signed)
This is likely viral and will take some time to get better.  If she gets worse please alert me.  Option -make an appt with me thurs or fri for a re check and if she feels better before then she can cancel it   Please keep me posted

## 2022-01-21 ENCOUNTER — Telehealth: Payer: Self-pay

## 2022-01-21 NOTE — Chronic Care Management (AMB) (Signed)
Chronic Care Management Pharmacy Assistant   Name: Suzanne Strong  MRN: [id number] DOB: 29-Jan-1946  Reason for Encounter: Medication Adherence and Delivery Coordination    Recent office visits:  01/16/22-Marne Tower,MD(PCP)-acute cough,chest xray,tussionex for cough,prednisone for wheezing.  Recent consult visits:  None since last CCM contact   Hospital visits:  None in previous 6 months  Medications: Outpatient Encounter Medications as of 01/21/2022  Medication Sig   acetaminophen (TYLENOL) 325 MG tablet Take 2 tablets (650 mg total) by mouth every 6 (six) hours as needed for mild pain (or Fever >/= 101).   albuterol (VENTOLIN HFA) 108 (90 Base) MCG/ACT inhaler Inhale 2 puffs into the lungs every 6 (six) hours as needed for wheezing or shortness of breath.   ALPRAZolam (XANAX) 0.5 MG tablet Take 1 tablet (0.5 mg total) by mouth 2 (two) times daily as needed for anxiety.   amitriptyline (ELAVIL) 50 MG tablet Take 1 tablet (50 mg total) by mouth at bedtime.   amLODipine (NORVASC) 5 MG tablet TAKE ONE TABLET BY MOUTH ONCE DAILY   atorvastatin (LIPITOR) 10 MG tablet TAKE ONE TABLET BY MOUTH ONCE DAILY   azithromycin (ZITHROMAX Z-PAK) 250 MG tablet Take 2 pills by mouth today and then 1 pill daily for 4 days   b complex vitamins tablet Take 1 tablet by mouth daily.   BIOTIN PO Take by mouth.   busPIRone (BUSPAR) 15 MG tablet Take 1.5 tablets (22.5 mg total) by mouth 2 (two) times daily.   cefdinir (OMNICEF) 300 MG capsule Take 1 capsule (300 mg total) by mouth 2 (two) times daily.   chlorpheniramine-HYDROcodone (TUSSIONEX) 10-8 MG/5ML Take 5 mLs by mouth every 12 (twelve) hours as needed for cough. Caution of sedation   Cholecalciferol (VITAMIN D-1000 MAX ST) 25 MCG (1000 UT) tablet Take 2,000 Units by mouth daily. Takes three 50 mcg tablets, 3 capsules daily   Emollient (COLLAGEN EX)    ferrous sulfate 325 (65 FE) MG tablet Take 1 tablet (325 mg total) by mouth 2 (two) times  daily with a meal.   glipiZIDE (GLUCOTROL) 5 MG tablet Take 2 tablets (10 mg total) by mouth daily before breakfast.   glucose blood (ONETOUCH ULTRA) test strip USE TO check blood glucose ONCE DAILY AND AS NEEDED FOR DIABETES   metFORMIN (GLUCOPHAGE) 500 MG tablet Take 2 tablets (1,000 mg total) by mouth 2 (two) times daily with a meal.   Multiple Vitamins-Minerals (WOMENS MULTIVITAMIN PO) Take by mouth.   omeprazole (PRILOSEC) 20 MG capsule TAKE ONE CAPSULE BY MOUTH EVERYDAY AT BEDTIME   predniSONE (DELTASONE) 10 MG tablet Take 3 pills once daily by mouth for 3 days, then 2 pills once daily for 3 days, then 1 pill once daily for 3 days and then stop   predniSONE (DELTASONE) 10 MG tablet Take 3 pills once daily by mouth for 3 days, then 2 pills once daily for 3 days, then 1 pill once daily for 3 days and then stop   pregabalin (LYRICA) 150 MG capsule Take 300 mg by mouth at bedtime.   rOPINIRole (REQUIP) 1 MG tablet TAKE 1/2 TABLET BY MOUTH EVERY MORNING and TAKE THREE TABLETS BY MOUTH EVERYDAY AT BEDTIME   sertraline (ZOLOFT) 100 MG tablet TAKE TWO TABLETS BY MOUTH EVERY MORNING   zinc gluconate 50 MG tablet Take 50 mg by mouth daily.   Facility-Administered Encounter Medications as of 01/21/2022  Medication   influenza vaccine adjuvanted (FLUAD) injection 0.5 mL   sodium chloride flush (  NS) 0.9 % injection 10 mL   BP Readings from Last 3 Encounters:  01/16/22 (!) 142/66  12/17/21 (!) 140/70  11/26/21 (!) 143/78    Pulse Readings from Last 3 Encounters:  01/16/22 94  12/17/21 87  11/26/21 81    Lab Results  Component Value Date/Time   HGBA1C 6.5 (H) 12/11/2020 03:42 AM   HGBA1C 6.7 (H) 05/28/2020 10:38 PM   HGBA1C 7.3 12/09/2016 12:00 AM   Lab Results  Component Value Date   CREATININE 0.84 11/26/2021   BUN 19 11/26/2021   GFR 57.18 (L) 08/18/2021   GFRNONAA >60 11/26/2021   GFRAA >60 10/20/2019   NA 140 11/26/2021   K 4.2 11/26/2021   CALCIUM 8.8 (L) 11/26/2021   CO2 30  11/26/2021     Last adherence delivery date:01/01/22      Patient is due for next adherence delivery on: 02/02/22  Spoke with patient on 01/22/22 reviewed medications and coordinated delivery.  This delivery to include: Adherence Packaging  30 Days  Pregabalin '150mg'$ - 2 daily (2 bedtime) Omeprazole '20mg'$ - 1 tablet daily (bedtime)  Ropinirole '1mg'$  -  tablet (breakfast) and 3 tablets nightly (bedtime)  Atorvastatin '10mg'$ - 1 tablet daily (breakfast)  Amlodipine '5mg'$ - 1 tablet daily (breakfast)   Amitriptyline '50mg'$ - 1 tablet daily (bedtime)  Alpha lipoic acid '200mg'$  - 1 capsule at( bedtime) Sertraline '100mg'$ -take 2 tablets at (breakfast )  Vial: ozempic '1mg'$ . - inject 0.'5mg'$  once weekly   Patient declined the following medications this month: none   Any concerns about your medications? Yes Patient requests to add ozempic lmg inject once weekly back to delivery from [facility] this delivery. Dr. Valda Favia office called and requested new RX to come to [facility]  How often do you forget or accidentally miss a dose? Rarely  Do you use a pillbox? No  Is patient in packaging Yes  What is the date on your next pill pack?  Any concerns or issues with your packaging?   No refill request needed.  Confirmed delivery date of 02/02/22, advised patient that pharmacy will contact them the morning of delivery.  Recent blood pressure readings are as follows: 01/22/22 - 134/70   Recent blood glucose readings are as follows:none available   Annual wellness visit in last year? Yes Most Recent BP reading:142/66  If Diabetic: Most recent A1C reading:7.1 Last eye exam / retinopathy screening:UTD Last diabetic foot exam:2019  Cycle dispensing form sent to Philbert Riser for review.  Next CCM appointment: none scheduled Charlene Brooke, CPP notified  Avel Sensor, [place]  [phone]

## 2022-01-22 ENCOUNTER — Ambulatory Visit: Payer: PPO | Admitting: Family Medicine

## 2022-01-28 ENCOUNTER — Telehealth: Payer: Self-pay | Admitting: Family Medicine

## 2022-01-28 NOTE — Telephone Encounter (Signed)
I spoke with pt; pt said she was seen 01/16/22 and has finished both abx and prednisone. Pt said she still has dry cough with some wheezing at night. No CP or SOB. Pt feeling tired and had neg covid test first of Dec. Pt said temp was 99 earlier today. Pt does not want to go to UC or ED. Pt scheduled appt with Dr Silvio Pate on 01/29/22 at 12:15 with UC & ED precautions and pt voiced understanding. Sending note to Dr Silvio Pate and Silvio Pate pool and FYI to Dr Glori Bickers as PCP.

## 2022-01-28 NOTE — Telephone Encounter (Signed)
I am leaving for today, will route to triage to triage sxs

## 2022-01-28 NOTE — Telephone Encounter (Signed)
Pt called asking if [facility] could give her a call back regarding her chest & coughing she's have? Call back # [phone]

## 2022-01-29 ENCOUNTER — Encounter: Payer: Self-pay | Admitting: Internal Medicine

## 2022-01-29 ENCOUNTER — Ambulatory Visit (INDEPENDENT_AMBULATORY_CARE_PROVIDER_SITE_OTHER): Payer: PPO | Admitting: Internal Medicine

## 2022-01-29 VITALS — BP 124/76 | HR 82 | Temp 97.7°F | Ht 66.0 in | Wt 216.6 lb

## 2022-01-29 DIAGNOSIS — J209 Acute bronchitis, unspecified: Secondary | ICD-10-CM

## 2022-01-29 MED ORDER — DOXYCYCLINE HYCLATE 100 MG PO TABS: 100.0000 mg | ORAL_TABLET | Freq: Two times a day (BID) | ORAL | 0 refills | Status: DC

## 2022-01-29 MED ORDER — BENZONATATE 200 MG PO CAPS: 200.0000 mg | ORAL_CAPSULE | Freq: Three times a day (TID) | ORAL | 0 refills | Status: DC | PRN

## 2022-01-29 NOTE — Progress Notes (Signed)
Subjective:    Patient ID: Suzanne Strong, female    DOB: 1945/07/04, 76 y.o.   MRN: [id number]  HPI Here due to persistent cough  Tendency to bronchitis Had spell in November--did seem to be better Saw Dr Glori Bickers with recurrence CXR showed interstitial prominence--given cefdinir and z-pak Now had 2 rounds of prednisone--helps some  Just never got completely better No SOB Some fever---yesterday (101.1) Has had hoarseness with this illness No sweats or shakes. Did have some chills yesterday  Harsh cough--no sputum Uses tussionex ---keeps the cough away at night No other cough meds  Current Outpatient Medications on File Prior to Visit  Medication Sig Dispense Refill   acetaminophen (TYLENOL) 325 MG tablet Take 2 tablets (650 mg total) by mouth every 6 (six) hours as needed for mild pain (or Fever >/= 101).     albuterol (VENTOLIN HFA) 108 (90 Base) MCG/ACT inhaler Inhale 2 puffs into the lungs every 6 (six) hours as needed for wheezing or shortness of breath. 8 g 2   amitriptyline (ELAVIL) 50 MG tablet Take 1 tablet (50 mg total) by mouth at bedtime. 90 tablet 3   amLODipine (NORVASC) 5 MG tablet TAKE ONE TABLET BY MOUTH ONCE DAILY 90 tablet 3   atorvastatin (LIPITOR) 10 MG tablet TAKE ONE TABLET BY MOUTH ONCE DAILY 90 tablet 3   b complex vitamins tablet Take 1 tablet by mouth daily.     BIOTIN PO Take by mouth.     busPIRone (BUSPAR) 15 MG tablet Take 1.5 tablets (22.5 mg total) by mouth 2 (two) times daily. 270 tablet 0   chlorpheniramine-HYDROcodone (TUSSIONEX) 10-8 MG/5ML Take 5 mLs by mouth every 12 (twelve) hours as needed for cough. Caution of sedation 50 mL 0   Cholecalciferol (VITAMIN D-1000 MAX ST) 25 MCG (1000 UT) tablet Take 2,000 Units by mouth daily. Takes three 50 mcg tablets, 3 capsules daily     Emollient (COLLAGEN EX)      ferrous sulfate 325 (65 FE) MG tablet Take 1 tablet (325 mg total) by mouth 2 (two) times daily with a meal.     glipiZIDE (GLUCOTROL) 5  MG tablet Take 2 tablets (10 mg total) by mouth daily before breakfast.     glucose blood (ONETOUCH ULTRA) test strip USE TO check blood glucose ONCE DAILY AND AS NEEDED FOR DIABETES 100 strip 1   metFORMIN (GLUCOPHAGE) 500 MG tablet Take 2 tablets (1,000 mg total) by mouth 2 (two) times daily with a meal.     Multiple Vitamins-Minerals (WOMENS MULTIVITAMIN PO) Take by mouth.     omeprazole (PRILOSEC) 20 MG capsule TAKE ONE CAPSULE BY MOUTH EVERYDAY AT BEDTIME 90 capsule 3   pregabalin (LYRICA) 150 MG capsule Take 300 mg by mouth at bedtime.     rOPINIRole (REQUIP) 1 MG tablet TAKE 1/2 TABLET BY MOUTH EVERY MORNING and TAKE THREE TABLETS BY MOUTH EVERYDAY AT BEDTIME 315 tablet 3   sertraline (ZOLOFT) 100 MG tablet TAKE TWO TABLETS BY MOUTH EVERY MORNING 60 tablet 1   zinc gluconate 50 MG tablet Take 50 mg by mouth daily.     ALPRAZolam (XANAX) 0.5 MG tablet Take 1 tablet (0.5 mg total) by mouth 2 (two) times daily as needed for anxiety. 60 tablet 2   Current Facility-Administered Medications on File Prior to Visit  Medication Dose Route Frequency Provider Last Rate Last Admin   influenza vaccine adjuvanted (FLUAD) injection 0.5 mL  0.5 mL Intramuscular Once Cammie Sickle, MD  sodium chloride flush (NS) 0.9 % injection 10 mL  10 mL Intravenous PRN Cammie Sickle, MD   10 mL at 09/11/15 0845    Allergies  Allergen Reactions   Amoxicillin Swelling    REACTION: rash, swelling Has patient had a PCN reaction causing immediate rash, facial/tongue/throat swelling, SOB or lightheadedness with hypotension: yes Has patient had a PCN reaction causing severe rash involving mucus membranes or skin necrosis: no Has patient had a PCN reaction that required hospitalization: no Has patient had a PCN reaction occurring within the last 10 years: yes If all of the above answers are "NO", then may proceed with Cephalosporin use.  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 Neo-adjuvant chemo with complete pathologic response.    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   BREAST CYST ASPIRATION     BREAST EXCISIONAL BIOPSY Left 12/2015   surgery   BREAST LUMPECTOMY WITH NEEDLE LOCALIZATION Left 10/02/2015   Procedure: BREAST LUMPECTOMY WITH NEEDLE LOCALIZATION;  Surgeon: [name], MD;  Location: [facility];  Service: General;  Laterality: Left;   BREAST LUMPECTOMY WITH SENTINEL LYMPH NODE BIOPSY Left 10/02/2015   Procedure: LEFT BREAST WIDE EXCISION WITH SENTINEL LYMPH NODE BX;  Surgeon: [name], MD;  Location: [facility];  Service: General;  Laterality: Left;   BREAST SURGERY     breast biopsy benign   CARPAL TUNNEL RELEASE Right 05/19/2017   Procedure: CARPAL TUNNEL RELEASE;  Surgeon: Earnestine Leys, MD;  Location: [facility];  Service: Orthopedics;  Laterality: Right;   CATARACT EXTRACTION  W/PHACO Right 10/26/2018   Procedure: CATARACT EXTRACTION PHACO AND INTRAOCULAR LENS PLACEMENT ([place]) right diabetic;  Surgeon: Leandrew Koyanagi, MD;  Location: [place];  Service: Ophthalmology;  Laterality: Right;  Diabetic - oral meds cancer center been notified and will come   CATARACT EXTRACTION W/PHACO Left 11/16/2018   Procedure: CATARACT EXTRACTION PHACO AND INTRAOCULAR LENS PLACEMENT ([facility]) LEFT DIABETIC  01:01.0  17.0%  10.37;  Surgeon: Leandrew Koyanagi, MD;  Location: [place];  Service: Ophthalmology;  Laterality: Left;  Diabetic - oral meds Port-a-cath   CHOLECYSTECTOMY     Cologuard  07/22/2016   Negative   COLONOSCOPY WITH PROPOFOL N/A 05/24/2020  Procedure: COLONOSCOPY WITH PROPOFOL;  Surgeon: Virgel Manifold, MD;  Location: [facility];  Service: Endoscopy;  Laterality: N/A;   ESOPHAGOGASTRODUODENOSCOPY  07/2009   normal with few gastric polyps   ESOPHAGOGASTRODUODENOSCOPY (EGD) WITH PROPOFOL N/A 05/24/2020   Procedure: ESOPHAGOGASTRODUODENOSCOPY (EGD) WITH PROPOFOL;  Surgeon: Virgel Manifold, MD;  Location: [facility];  Service: Endoscopy;  Laterality: N/A;   MOLE REMOVAL     PORT-A-CATH REMOVAL     PORTACATH PLACEMENT     PORTACATH PLACEMENT Right 03/12/2015   Procedure: INSERTION PORT-A-CATH;  Surgeon: [name], MD;  Location: [facility];  Service: General;  Laterality: Right;   RADICAL NECK DISSECTION     TONSILLECTOMY     cancer treated with chemo and radiation   TRIGGER FINGER RELEASE Right 05/19/2017   Procedure: RELEASE TRIGGER FINGER/A-1 PULLEY ring finger;  Surgeon: Earnestine Leys, MD;  Location: [facility];  Service: Orthopedics;  Laterality: Right;    Family History  Problem Relation Age of Onset   Osteoporosis Mother    Hyperlipidemia Mother    Depression Mother    Cancer Mother        skin cancer ? basal cell and lung ca heavy smoker   Alcohol abuse Father    Cancer Father        skin CA ? basal cell   Heart  disease Father        CHF   Hyperlipidemia Brother    Hypertension Brother    Breast cancer Neg Hx     Social History   Socioeconomic History   Marital status: Widowed    Spouse name: Not on file   Number of children: 0   Years of education: 14   Highest education level: Not on file  Occupational History   Occupation: book Therapist, nutritional  Tobacco Use   Smoking status: Former    Packs/day: 0.50    Years: 6.00    Total pack years: 3.00    Types: Cigarettes    Quit date: 03/10/1984    Years since quitting: 37.9   Smokeless tobacco: Never  Vaping Use   Vaping Use: Never used  Substance and Sexual Activity   Alcohol use: No    Alcohol/week: 0.0 standard drinks of alcohol   Drug use: No   Sexual activity: Not Currently  Other Topics Concern   Not on file  Social History Narrative   Lives with husband in a one story home.  No children.        Works as Radiation protection practitioner for [employer].        Education: college.    Social Determinants of Health   Financial Resource Strain: Low Risk  (11/27/2021)   Overall Financial Resource Strain (CARDIA)    Difficulty of Paying Living Expenses: Not hard at all  Food Insecurity: No Food Insecurity (11/27/2021)   Hunger Vital Sign    Worried About Running Out of Food in the Last Year: Never true    Ran Out of Food in the Last Year: Never true  Transportation Needs: No Transportation Needs (11/27/2021)   PRAPARE - Hydrologist (Medical): No    Lack of Transportation (Non-Medical): No  Physical Activity: Insufficiently Active (11/27/2021)   Exercise Vital Sign    Days of Exercise per Week: 3 days    Minutes of Exercise per Session: 30 min  Stress: No Stress Concern Present (11/27/2021)   [place]  Feeling of Stress : Not at all  Social Connections: Moderately Integrated (11/27/2021)   Social Connection and Isolation Panel [NHANES]    Frequency  of Communication with Friends and Family: More than three times a week    Frequency of Social Gatherings with Friends and Family: More than three times a week    Attends Religious Services: More than 4 times per year    Active Member of [employer] or Organizations: Yes    Attends Archivist Meetings: More than 4 times per year    Marital Status: Widowed  Intimate Partner Violence: Not At Risk (11/27/2021)   Humiliation, Afraid, Rape, and Kick questionnaire    Fear of Current or Ex-Partner: No    Emotionally Abused: No    Physically Abused: No    Sexually Abused: No   Review of Systems Past smoker--stopped 15 years ago No history of asthma No N/V Eating okay     Objective:   Physical Exam Constitutional:      Appearance: Normal appearance.  HENT:     Mouth/Throat:     Pharynx: No oropharyngeal exudate or posterior oropharyngeal erythema.  Cardiovascular:     Rate and Rhythm: Normal rate and regular rhythm.     Heart sounds: No murmur heard.    No gallop.  Pulmonary:     Effort: Pulmonary effort is normal.     Breath sounds: No wheezing.     Comments: Slight dry early inspiratory crackles at left base Musculoskeletal:     Cervical back: Neck supple.  Lymphadenopathy:     Cervical: No cervical adenopathy.  Neurological:     Mental Status: She is alert.            Assessment & Plan:

## 2022-01-29 NOTE — Assessment & Plan Note (Signed)
Recurrent Some degree of chronic lung disease with chronic CXR findings The crackles don't seem to correspond with pneumonia clinically Will try doxycycline 100 bid for a week Add benzonatate for cough No prednisone--not tight now Consider levaquin or pulmonary referral for persistent symptoms

## 2022-01-29 NOTE — Telephone Encounter (Signed)
She may just have residual symptoms---but another visit seems warranted

## 2022-02-04 ENCOUNTER — Other Ambulatory Visit: Payer: Self-pay | Admitting: Psychiatry

## 2022-02-04 DIAGNOSIS — F411 Generalized anxiety disorder: Secondary | ICD-10-CM

## 2022-02-05 ENCOUNTER — Telehealth: Payer: Self-pay | Admitting: Family Medicine

## 2022-02-05 ENCOUNTER — Other Ambulatory Visit: Payer: Self-pay

## 2022-02-05 ENCOUNTER — Telehealth: Payer: Self-pay | Admitting: Psychiatry

## 2022-02-05 DIAGNOSIS — F411 Generalized anxiety disorder: Secondary | ICD-10-CM

## 2022-02-05 MED ORDER — ALPRAZOLAM 0.5 MG PO TABS: 0.5000 mg | ORAL_TABLET | Freq: Two times a day (BID) | ORAL | 2 refills | Status: DC | PRN

## 2022-02-05 NOTE — Telephone Encounter (Signed)
Patient was seen on 01/29/22 by Dr Gilman Schmidt dry cough,sneezing,etc. And was placed on medications.She called in today saying that she is now experiencing an upset stomach and diarrhea . She would like to know if theres anything that can be called in for her?

## 2022-02-05 NOTE — Telephone Encounter (Signed)
Please triage if possible  She was on an abx  ? Loose stool or watery stool? Fever? N/v?  Thanks

## 2022-02-05 NOTE — Telephone Encounter (Signed)
Next visit is 05/03/21. Requesting a refill on her Alprazolam 0.5 mg. The most recent prescription shows last filled on 12/18/21 and has 2 refills. [facility] said the script they have expired. Could this be checked on and please call Reisa as she is confused about it. Her phone number is [phone]. Pharmacy is:  [facility] [address] [place]   Phone: [phone]  Fax: [phone]

## 2022-02-05 NOTE — Telephone Encounter (Signed)
Patient notified

## 2022-02-05 NOTE — Telephone Encounter (Signed)
Rx sent 

## 2022-02-06 MED ORDER — ONDANSETRON HCL 4 MG PO TABS: 4.0000 mg | ORAL_TABLET | Freq: Three times a day (TID) | ORAL | 0 refills | Status: DC | PRN

## 2022-02-06 NOTE — Telephone Encounter (Signed)
I spoke with pt; pt said last 12 hrs had vomiting x 2. Last time vomiting was earlier this morning; no blood seen. Pt still having nausea but drinking ensure, fruit smoothies and water. No fever. No dry mouth, abd pain or dizziness. Pt had diarrhea with loose stools first of wk but no diarrhea in last 2 days. Pt said finished abx 02/04/22 or 02/05/22. Pt said cough has gone from dry to prod cough with white to yellow phlegm. No CP or SOB. No wheezing. Pt said she does not need to go to UC or ED but would like med for N&V sent to [place] [address]. Pt request cb after reviewed by Dr Glori Bickers. Sending note to Dr Glori Bickers, [employer] and will teams [place] CMA.

## 2022-02-06 NOTE — Telephone Encounter (Signed)
Spoke to pt. Expressed her understanding.

## 2022-02-06 NOTE — Addendum Note (Signed)
Addended by: [name] A on: 02/06/2022 08:51 AM   Modules accepted: Orders

## 2022-02-06 NOTE — Telephone Encounter (Signed)
Glad the diarrhea is improved Try and stay hydrated   I sent in zofran   Unsure what is causing her nausea  If it worsens or diarrhea returns over the wkend will need to go to [place] or ER for urgent eval

## 2022-02-10 ENCOUNTER — Encounter: Payer: Self-pay | Admitting: Family Medicine

## 2022-02-10 NOTE — Telephone Encounter (Signed)
error 

## 2022-02-12 ENCOUNTER — Encounter: Payer: Self-pay | Admitting: Family Medicine

## 2022-02-12 ENCOUNTER — Ambulatory Visit (INDEPENDENT_AMBULATORY_CARE_PROVIDER_SITE_OTHER): Payer: PPO | Admitting: Family Medicine

## 2022-02-12 VITALS — BP 132/62 | HR 114 | Temp 97.1°F | Ht 66.0 in | Wt 205.1 lb

## 2022-02-12 DIAGNOSIS — D509 Iron deficiency anemia, unspecified: Secondary | ICD-10-CM

## 2022-02-12 DIAGNOSIS — G2581 Restless legs syndrome: Secondary | ICD-10-CM

## 2022-02-12 DIAGNOSIS — K219 Gastro-esophageal reflux disease without esophagitis: Secondary | ICD-10-CM | POA: Diagnosis not present

## 2022-02-12 DIAGNOSIS — I10 Essential (primary) hypertension: Secondary | ICD-10-CM | POA: Diagnosis not present

## 2022-02-12 DIAGNOSIS — R112 Nausea with vomiting, unspecified: Secondary | ICD-10-CM | POA: Diagnosis not present

## 2022-02-12 DIAGNOSIS — H539 Unspecified visual disturbance: Secondary | ICD-10-CM | POA: Diagnosis not present

## 2022-02-12 DIAGNOSIS — Z79899 Other long term (current) drug therapy: Secondary | ICD-10-CM | POA: Diagnosis not present

## 2022-02-12 LAB — COMPREHENSIVE METABOLIC PANEL
ALT: 11 U/L (ref 0–35)
AST: 16 U/L (ref 0–37)
Albumin: 3.8 g/dL (ref 3.5–5.2)
Alkaline Phosphatase: 64 U/L (ref 39–117)
BUN: 20 mg/dL (ref 6–23)
CO2: 32 mEq/L (ref 19–32)
Calcium: 8.6 mg/dL (ref 8.4–10.5)
Chloride: 96 mEq/L (ref 96–112)
Creatinine, Ser: 1.15 mg/dL (ref 0.40–1.20)
GFR: 47.03 mL/min — ABNORMAL LOW (ref >60.00)
Glucose, Bld: 161 mg/dL — ABNORMAL HIGH (ref 70–99)
Potassium: 3.7 mEq/L (ref 3.5–5.1)
Sodium: 137 mEq/L (ref 135–145)
Total Bilirubin: 0.3 mg/dL (ref 0.2–1.2)
Total Protein: 6.9 g/dL (ref 6.0–8.3)

## 2022-02-12 LAB — CBC WITH DIFFERENTIAL/PLATELET
Basophils Absolute: 0 10*3/uL (ref 0.0–0.1)
Basophils Relative: 0.4 % (ref 0.0–3.0)
Eosinophils Absolute: 0.1 10*3/uL (ref 0.0–0.7)
Eosinophils Relative: 1.6 % (ref 0.0–5.0)
HCT: 34.9 % — ABNORMAL LOW (ref 36.0–46.0)
Hemoglobin: 10.9 g/dL — ABNORMAL LOW (ref 12.0–15.0)
Lymphocytes Relative: 19.2 % (ref 12.0–46.0)
Lymphs Abs: 1.2 10*3/uL (ref 0.7–4.0)
MCHC: 31.1 g/dL (ref 30.0–36.0)
MCV: 76.7 fl — ABNORMAL LOW (ref 78.0–100.0)
Monocytes Absolute: 0.4 10*3/uL (ref 0.1–1.0)
Monocytes Relative: 6.2 % (ref 3.0–12.0)
Neutro Abs: 4.5 10*3/uL (ref 1.4–7.7)
Neutrophils Relative %: 72.6 % (ref 43.0–77.0)
Platelets: 312 10*3/uL (ref 150.0–400.0)
RBC: 4.55 Mil/uL (ref 3.87–5.11)
RDW: 18 % — ABNORMAL HIGH (ref 11.5–15.5)
WBC: 6.2 10*3/uL (ref 4.0–10.5)

## 2022-02-12 LAB — IRON: Iron: 41 ug/dL — ABNORMAL LOW (ref 42–145)

## 2022-02-12 MED ORDER — FAMOTIDINE 20 MG PO TABS: 20.0000 mg | ORAL_TABLET | Freq: Two times a day (BID) | ORAL | 0 refills | Status: DC

## 2022-02-12 NOTE — Patient Instructions (Addendum)
Check with your eye doctor about the double vision   Continue the omeprazole daily  Add generic pepcid (famotidine) 20 mg twice daily for a month to settle your stomach  Drink fluids/hydrate the best you can Drink water instead of sweet tea  Eat small meals and keep diet bland for a little while longer   Labs today for cbc and iron to see if this is down since restless legs are worse    Keep up posted

## 2022-02-12 NOTE — Assessment & Plan Note (Signed)
Worse lately  On requip and lyrica-hesitate to inc due to polypharmacy  Cbc, iron levels today

## 2022-02-12 NOTE — Assessment & Plan Note (Signed)
Pt notes double vision worse in L eye when very tired or trying to focus Has dry eye No acute changes on exam Inst to follow up wth ophy

## 2022-02-12 NOTE — Assessment & Plan Note (Signed)
Multiple sedating medicines incl benzo, ssri, buspar, amitriptyline in addn to requip and lyrica  This could worsen fall and aspiration risk Enc pt to d/w her psychiatrist   Consider pharm consult as well

## 2022-02-12 NOTE — Assessment & Plan Note (Signed)
Unsure if related to recent n/v Also gastroparesis   Taking omeprazole 20   Will add 20 mg pepcid bid for a mo to heal

## 2022-02-12 NOTE — Assessment & Plan Note (Signed)
Worse rls sympt lately   Cbc and iron ordered  Takes ferrous sulfate bid

## 2022-02-12 NOTE — Progress Notes (Signed)
Subjective:    Patient ID: Suzanne Strong, female    DOB: 10/08/1945, 76 y.o.   MRN: [id number]  HPI Pt presents for ongoing cough causing emesis   Wt Readings from Last 3 Encounters:  02/12/22 205 lb 2 oz (93 kg)  01/29/22 216 lb 9.6 oz (98.2 kg)  01/16/22 214 lb 4 oz (97.2 kg)   33.11 kg/m  Vitals:   02/12/22 1136  BP: 132/62  Pulse: (!) 114  Temp: (!) 97.1 F (36.2 C)  TempSrc: Temporal  SpO2: 95%  Weight: 205 lb 2 oz (93 kg)  Height: '5\' 6"'$  (1.676 m)    Seen on 12/1 with resp symptoms/ reassuring cxr  Covered with cefdinir and azthromycin in light of recurrent asp pna Was given tussionex for cough and prednisone times 2 rounds   The cough persisted so then seen by Dr Silvio Pate on 12/14  Noted some crackles and covered with doxycycline for a week  Benzonatate for cough   Over xmas she notes 02 wa down to high 80s to low 90s (on exertion)  HR was up to the 90s at rest  ? If dehydration    After this began to have upset stomach and diarrhea  We sent in zoftan for nausea  That did help some   Diarrhea has persisted  No vomiting from 23-27th  Then vomited yesterday None today   Right now she feels pretty normal   Balance was very bad during all of this  At times slow to respond    She has h/o GERD on omep 20 Also gastroparesis and impaired swallowing   Diarrhea is better now  Not much appetite overall  Still feels weak   Taking normal amt of fluid but not extra  Drinks sweet tea   The cough is gone now  Took a zofran last night when she was nauseated   Occ feels like her vision is "double" and depth perception is off  Not new- for months  Intermittent and worse in L eye   Struggles with restless legs   Lab Results  Component Value Date   WBC 5.4 11/26/2021   HGB 10.6 (L) 11/26/2021   HCT 34.0 (L) 11/26/2021   MCV 86.3 11/26/2021   PLT 275 11/26/2021   Lab Results  Component Value Date   IRON 15 (L) 12/11/2020   TIBC 252 12/11/2020    FERRITIN 187 12/11/2020   H/o iron def  Followed by oncology  Ferrous sulfate bid now  Patient Active Problem List   Diagnosis Date Noted   Vision changes 02/12/2022   Polypharmacy 02/12/2022   Plant dermatitis 10/21/2021   Nausea & vomiting 04/23/2021   At high risk for aspiration 04/23/2021   COVID-19 03/18/2021   Dysuria 07/17/2020   Dysphagia    Gastric erythema    Gastric polyp    Encounter for screening colonoscopy    Polyp of colon    History of aspiration pneumonia 02/15/2020   Diabetes mellitus without complication ([place])    GERD (gastroesophageal reflux disease)    Hypertension    Urinary urgency 01/05/2020   Grief reaction 09/18/2019   Swallowing dysfunction 07/18/2019   Cough 02/21/2019   Fever, intermittent 09/01/2018   Pre-syncope 02/07/2018   Orthostatic hypotension 01/26/2018   Episodic weakness 01/26/2018   Medicare annual wellness visit, subsequent 06/29/2017   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   Preoperative examination 04/27/2017   Carpal tunnel syndrome of right wrist 11/13/2016   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   Obstructive sleep apnea 01/01/2016   Carcinoma of upper-outer quadrant of left breast in female, estrogen receptor negative ([place]) 11/21/2015   Hypomagnesemia 08/28/2015   Fever 04/11/2015   Initial Medicare annual wellness visit 10/16/2014   Estrogen deficiency 10/16/2014   Colon cancer screening 10/16/2014   Controlled type 2 diabetes mellitus without complication ([place]) 75/88/3254   RLS (restless legs syndrome) 02/21/2014   Chronic neck pain 07/25/2010   Depression with anxiety 07/07/2010   Routine general medical examination at a health care facility 06/26/2010   B12 deficiency 09/04/2009   Anemia, iron deficiency 08/29/2009   Anxiety state 08/26/2009   GASTROPARESIS 08/26/2009   Vitamin D deficiency 07/19/2008   Disorder of bone and cartilage 07/19/2007   Hyperlipidemia 07/18/2007   EDEMA  07/18/2007   Past Medical History:  Diagnosis Date   Anemia    iron deficiency and B12 deficiency   Anxiety    Breast cancer ([place]) 02/25/2015   upper-outer quadrant of left female breast, Triple Negative. Neo-adjuvant chemo with complete pathologic response.    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   BREAST CYST ASPIRATION     BREAST EXCISIONAL BIOPSY Left 12/2015   surgery   BREAST LUMPECTOMY WITH NEEDLE LOCALIZATION Left 10/02/2015   Procedure: BREAST LUMPECTOMY WITH NEEDLE LOCALIZATION;  Surgeon: [name], MD;  Location: [facility];  Service: General;  Laterality: Left;   BREAST LUMPECTOMY WITH SENTINEL LYMPH NODE BIOPSY Left 10/02/2015   Procedure: LEFT BREAST WIDE EXCISION WITH SENTINEL LYMPH NODE BX;  Surgeon: [name], MD;  Location: [facility];  Service: General;  Laterality: Left;   BREAST SURGERY     breast biopsy benign   CARPAL TUNNEL RELEASE Right 05/19/2017   Procedure: CARPAL TUNNEL RELEASE;  Surgeon: Earnestine Leys, MD;  Location: [facility];  Service: Orthopedics;  Laterality: Right;   CATARACT EXTRACTION W/PHACO Right 10/26/2018   Procedure: CATARACT EXTRACTION PHACO AND INTRAOCULAR LENS PLACEMENT ([place]) right diabetic;  Surgeon: Leandrew Koyanagi, MD;  Location: [facility];  Service: Ophthalmology;  Laterality: Right;   Diabetic - oral meds cancer center been notified and will come   CATARACT EXTRACTION W/PHACO Left 11/16/2018   Procedure: CATARACT EXTRACTION PHACO AND INTRAOCULAR LENS PLACEMENT ([facility]) LEFT DIABETIC  01:01.0  17.0%  10.37;  Surgeon: Leandrew Koyanagi, MD;  Location: [facility];  Service: Ophthalmology;  Laterality: Left;  Diabetic - oral meds Port-a-cath   CHOLECYSTECTOMY     Cologuard  07/22/2016   Negative   COLONOSCOPY WITH PROPOFOL N/A 05/24/2020   Procedure: COLONOSCOPY WITH PROPOFOL;  Surgeon: Virgel Manifold, MD;  Location: [facility];  Service: Endoscopy;  Laterality: N/A;   ESOPHAGOGASTRODUODENOSCOPY  07/2009   normal with few gastric  polyps   ESOPHAGOGASTRODUODENOSCOPY (EGD) WITH PROPOFOL N/A 05/24/2020   Procedure: ESOPHAGOGASTRODUODENOSCOPY (EGD) WITH PROPOFOL;  Surgeon: Virgel Manifold, MD;  Location: [facility];  Service: Endoscopy;  Laterality: N/A;   MOLE REMOVAL     PORT-A-CATH REMOVAL     PORTACATH PLACEMENT     PORTACATH PLACEMENT Right 03/12/2015   Procedure: INSERTION PORT-A-CATH;  Surgeon: [name], MD;  Location: [facility];  Service: General;  Laterality: Right;   RADICAL NECK DISSECTION     TONSILLECTOMY     cancer treated with chemo and radiation   TRIGGER FINGER RELEASE Right 05/19/2017   Procedure: RELEASE TRIGGER FINGER/A-1 PULLEY ring finger;  Surgeon: Earnestine Leys, MD;  Location: [facility];  Service: Orthopedics;  Laterality: Right;   Social History   Tobacco Use   Smoking status: Former    Packs/day: 0.50    Years: 6.00    Total pack years: 3.00    Types: Cigarettes    Quit date: 03/10/1984    Years since quitting: 37.9   Smokeless tobacco: Never  Vaping Use   Vaping Use: Never used  Substance Use Topics   Alcohol use: No    Alcohol/week: 0.0 standard drinks of alcohol   Drug use: No   Family History  Problem Relation Age of Onset   Osteoporosis Mother    Hyperlipidemia Mother    Depression Mother    Cancer  Mother        skin cancer ? basal cell and lung ca heavy smoker   Alcohol abuse Father    Cancer Father        skin CA ? basal cell   Heart disease Father        CHF   Hyperlipidemia Brother    Hypertension Brother    Breast cancer Neg Hx    Allergies  Allergen Reactions   Amoxicillin Swelling    REACTION: rash, swelling Has patient had a PCN reaction causing immediate rash, facial/tongue/throat swelling, SOB or lightheadedness with hypotension: yes Has patient had a PCN reaction causing severe rash involving mucus membranes or skin necrosis: no Has patient had a PCN reaction that required hospitalization: no Has patient had a PCN reaction occurring within the last 10 years: yes If all of the above answers are "NO", then may proceed with Cephalosporin use.  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     albuterol (VENTOLIN HFA) 108 (90 Base) MCG/ACT inhaler Inhale 2 puffs into the lungs every 6 (six) hours as needed for wheezing or shortness of breath. 8 g 2   ALPRAZolam (XANAX) 0.5 MG tablet Take 1 tablet (0.5 mg total) by mouth 2 (two) times daily as needed for anxiety. 60 tablet 2   amitriptyline (ELAVIL) 50 MG tablet Take 1 tablet (50 mg total) by mouth at bedtime. 90 tablet 3   amLODipine (NORVASC) 5 MG tablet TAKE ONE TABLET BY MOUTH ONCE DAILY 90 tablet 3   atorvastatin (LIPITOR) 10 MG  tablet TAKE ONE TABLET BY MOUTH ONCE DAILY 90 tablet 3   b complex vitamins tablet Take 1 tablet by mouth daily.      BIOTIN PO Take by mouth.     busPIRone (BUSPAR) 15 MG tablet Take 1.5 tablets (22.5 mg total) by mouth 2 (two) times daily. 270 tablet 0   chlorpheniramine-HYDROcodone (TUSSIONEX) 10-8 MG/5ML Take 5 mLs by mouth every 12 (twelve) hours as needed for cough. Caution of sedation 50 mL 0   Cholecalciferol (VITAMIN D-1000 MAX ST) 25 MCG (1000 UT) tablet Take 2,000 Units by mouth daily. Takes three 50 mcg tablets, 3 capsules daily     Emollient (COLLAGEN EX)      ferrous sulfate 325 (65 FE) MG tablet Take 1 tablet (325 mg total) by mouth 2 (two) times daily with a meal.     glipiZIDE (GLUCOTROL) 5 MG tablet Take 2 tablets (10 mg total) by mouth daily before breakfast.     glucose blood (ONETOUCH ULTRA) test strip USE TO check blood glucose ONCE DAILY AND AS NEEDED FOR DIABETES 100 strip 1   metFORMIN (GLUCOPHAGE) 500 MG tablet Take 2 tablets (1,000 mg total) by mouth 2 (two) times daily with a meal.     Multiple Vitamins-Minerals (WOMENS MULTIVITAMIN PO) Take by mouth.     omeprazole (PRILOSEC) 20 MG capsule TAKE ONE CAPSULE BY MOUTH EVERYDAY AT BEDTIME 90 capsule 3   ondansetron (ZOFRAN) 4 MG tablet Take 1 tablet (4 mg total) by mouth every 8 (eight) hours as needed for nausea or vomiting. 15 tablet 0   pregabalin (LYRICA) 150 MG capsule Take 300 mg by mouth at bedtime.     rOPINIRole (REQUIP) 1 MG tablet TAKE 1/2 TABLET BY MOUTH EVERY MORNING and TAKE THREE TABLETS BY MOUTH EVERYDAY AT BEDTIME 315 tablet 3   sertraline (ZOLOFT) 100 MG tablet TAKE TWO TABLETS BY MOUTH EVERY MORNING 60 tablet 1   zinc gluconate 50 MG tablet Take 50 mg by mouth daily.     Current Facility-Administered Medications on File Prior to Visit  Medication Dose Route Frequency Provider Last Rate Last Admin   influenza vaccine adjuvanted (FLUAD) injection 0.5 mL  0.5 mL Intramuscular Once Charlaine Dalton R, MD       sodium chloride flush (NS) 0.9 % injection 10 mL  10 mL Intravenous PRN Cammie Sickle, MD   10 mL at  09/11/15 0845     Review of Systems  Constitutional:  Negative for activity change, appetite change, fatigue, fever and unexpected weight change.  HENT:  Negative for congestion, ear pain, rhinorrhea, sinus pressure and sore throat.   Eyes:  Positive for visual disturbance. Negative for photophobia, pain and redness.  Respiratory:  Positive for cough. Negative for shortness of breath and wheezing.        Improved cough  Cardiovascular:  Negative for chest pain and palpitations.  Gastrointestinal:  Positive for nausea. Negative for abdominal pain, blood in stool, constipation, diarrhea and rectal pain.  Endocrine: Negative for polydipsia and polyuria.  Genitourinary:  Negative for dysuria, frequency and urgency.  Musculoskeletal:  Negative for arthralgias, back pain and myalgias.  Skin:  Negative for pallor and rash.  Allergic/Immunologic: Negative for environmental allergies.  Neurological:  Positive for weakness. Negative for dizziness, syncope and headaches.  Hematological:  Negative for adenopathy. Does not bruise/bleed easily.  Psychiatric/Behavioral:  Negative for decreased concentration and dysphoric mood. The patient is not nervous/anxious.        Objective:   Physical  Exam Constitutional:      General: She is not in acute distress.    Appearance: Normal appearance. She is well-developed. She is obese. She is not ill-appearing or diaphoretic.  HENT:     Head: Normocephalic and atraumatic.     Right Ear: Tympanic membrane and ear canal normal.     Left Ear: Tympanic membrane and ear canal normal.     Mouth/Throat:     Mouth: Mucous membranes are moist.  Eyes:     General: No scleral icterus.       Right eye: No discharge.        Left eye: No discharge.     Conjunctiva/sclera: Conjunctivae normal.     Pupils: Pupils are equal, round, and reactive to light.  Neck:     Thyroid: No thyromegaly.     Vascular: No carotid bruit or JVD.  Cardiovascular:     Rate and Rhythm:  Regular rhythm. Tachycardia present.     Heart sounds: Normal heart sounds.     No gallop.  Pulmonary:     Effort: Pulmonary effort is normal. No respiratory distress.     Breath sounds: Normal breath sounds. No wheezing or rales.  Abdominal:     General: There is no distension or abdominal bruit.     Palpations: Abdomen is soft.  Musculoskeletal:     Cervical back: Normal range of motion and neck supple.     Right lower leg: No edema.     Left lower leg: No edema.  Lymphadenopathy:     Cervical: No cervical adenopathy.  Skin:    General: Skin is warm and dry.     Coloration: Skin is not pale.     Findings: No rash.     Comments: Fair skin turgor (seems mildly dehydrated) Nl cap refill time   Neurological:     Mental Status: She is alert.     Coordination: Coordination normal.     Deep Tendon Reflexes: Reflexes are normal and symmetric. Reflexes normal.  Psychiatric:        Mood and Affect: Mood is anxious.           Assessment & Plan:   Problem List Items Addressed This Visit       Cardiovascular and Mediastinum   Hypertension    Stable BP: 132/62   Continues  Amlodipine 5 mg daily       Relevant Orders   Comprehensive metabolic panel     Digestive   GERD (gastroesophageal reflux disease) - Primary    Unsure if related to recent n/v Also gastroparesis   Taking omeprazole 20   Will add 20 mg pepcid bid for a mo to heal      Relevant Medications   famotidine (PEPCID) 20 MG tablet   Nausea & vomiting    Intermittent May have been viral vs due to gastroparesis Worrisome with aspiration risk Nl exam Better today  Has zofran prn 4 mg (can change to ODT later if needed)  Diarrhea also resolved  Px pepcid 20 mg bid for 1 mo to help heal       Relevant Orders   Comprehensive metabolic panel   CBC with Differential/Platelet     Other   Anemia, iron deficiency    Worse rls sympt lately   Cbc and iron ordered  Takes ferrous sulfate bid        Relevant Orders   CBC with Differential/Platelet   Iron   Polypharmacy  Multiple sedating medicines incl benzo, ssri, buspar, amitriptyline in addn to requip and lyrica  This could worsen fall and aspiration risk Enc pt to d/w her psychiatrist   Consider pharm consult as well      RLS (restless legs syndrome)    Worse lately  On requip and lyrica-hesitate to inc due to polypharmacy  Cbc, iron levels today       Relevant Orders   CBC with Differential/Platelet   Iron   Vision changes    Pt notes double vision worse in L eye when very tired or trying to focus Has dry eye No acute changes on exam Inst to follow up wth ophy

## 2022-02-12 NOTE — Assessment & Plan Note (Signed)
Intermittent May have been viral vs due to gastroparesis Worrisome with aspiration risk Nl exam Better today  Has zofran prn 4 mg (can change to ODT later if needed)  Diarrhea also resolved  Px pepcid 20 mg bid for 1 mo to help heal

## 2022-02-12 NOTE — Assessment & Plan Note (Signed)
Stable BP: 132/62   Continues  Amlodipine 5 mg daily

## 2022-02-18 ENCOUNTER — Other Ambulatory Visit: Payer: Self-pay | Admitting: Psychiatry

## 2022-02-18 DIAGNOSIS — F411 Generalized anxiety disorder: Secondary | ICD-10-CM

## 2022-02-18 DIAGNOSIS — F5101 Primary insomnia: Secondary | ICD-10-CM

## 2022-02-18 NOTE — Telephone Encounter (Signed)
Please review

## 2022-02-18 NOTE — Telephone Encounter (Signed)
Script sent. Please let her know if she has a pharmacy deductible to meet the med cost might be higher right now.

## 2022-02-18 NOTE — Telephone Encounter (Signed)
[address] [place] [id number]

## 2022-02-18 NOTE — Telephone Encounter (Signed)
Pt called at 1:10p.  She would like Janett Billow to send in the script for [place] in order for her to see what her copay is going to be.  She has a Adult nurse (she said it has the same ID number).  Next appt 3/18

## 2022-02-26 DIAGNOSIS — E119 Type 2 diabetes mellitus without complications: Secondary | ICD-10-CM | POA: Diagnosis not present

## 2022-03-01 DIAGNOSIS — M1712 Unilateral primary osteoarthritis, left knee: Secondary | ICD-10-CM | POA: Diagnosis not present

## 2022-03-05 DIAGNOSIS — E1165 Type 2 diabetes mellitus with hyperglycemia: Secondary | ICD-10-CM | POA: Diagnosis not present

## 2022-03-05 DIAGNOSIS — E1140 Type 2 diabetes mellitus with diabetic neuropathy, unspecified: Secondary | ICD-10-CM | POA: Diagnosis not present

## 2022-03-10 ENCOUNTER — Encounter: Payer: Self-pay | Admitting: Family Medicine

## 2022-03-10 MED ORDER — OMEPRAZOLE 20 MG PO CPDR: DELAYED_RELEASE_CAPSULE | ORAL | 3 refills | Status: DC

## 2022-03-10 MED ORDER — FAMOTIDINE 20 MG PO TABS: 20.0000 mg | ORAL_TABLET | Freq: Two times a day (BID) | ORAL | 5 refills | Status: DC

## 2022-03-10 MED ORDER — ATORVASTATIN CALCIUM 10 MG PO TABS: ORAL_TABLET | ORAL | 3 refills | Status: DC

## 2022-03-10 MED ORDER — ROPINIROLE HCL 1 MG PO TABS: ORAL_TABLET | ORAL | 3 refills | Status: DC

## 2022-03-10 MED ORDER — ALBUTEROL SULFATE HFA 108 (90 BASE) MCG/ACT IN AERS: 2.0000 | INHALATION_SPRAY | Freq: Four times a day (QID) | RESPIRATORY_TRACT | 3 refills | Status: AC | PRN

## 2022-03-10 MED ORDER — AMLODIPINE BESYLATE 5 MG PO TABS: 5.0000 mg | ORAL_TABLET | Freq: Every day | ORAL | 3 refills | Status: DC

## 2022-03-10 NOTE — Telephone Encounter (Signed)
See mychart message, I pend all the Rxs I could see that PCP has filled recently

## 2022-04-06 ENCOUNTER — Ambulatory Visit (INDEPENDENT_AMBULATORY_CARE_PROVIDER_SITE_OTHER)
Admission: RE | Admit: 2022-04-06 | Discharge: 2022-04-06 | Disposition: A | Discharge status: Home / Self Care | Payer: HMO | Source: Ambulatory Visit | Attending: Family Medicine | Admitting: Family Medicine

## 2022-04-06 ENCOUNTER — Encounter: Payer: Self-pay | Admitting: Family Medicine

## 2022-04-06 ENCOUNTER — Encounter: Payer: Self-pay | Admitting: Internal Medicine

## 2022-04-06 ENCOUNTER — Ambulatory Visit (INDEPENDENT_AMBULATORY_CARE_PROVIDER_SITE_OTHER): Payer: HMO | Admitting: Family Medicine

## 2022-04-06 VITALS — BP 134/78 | HR 90 | Temp 97.7°F | Ht 66.0 in | Wt 218.0 lb

## 2022-04-06 DIAGNOSIS — R062 Wheezing: Secondary | ICD-10-CM | POA: Diagnosis not present

## 2022-04-06 DIAGNOSIS — R059 Cough, unspecified: Secondary | ICD-10-CM | POA: Diagnosis not present

## 2022-04-06 DIAGNOSIS — R052 Subacute cough: Secondary | ICD-10-CM

## 2022-04-06 MED ORDER — HYDROCOD POLI-CHLORPHE POLI ER 10-8 MG/5ML PO SUER: 5.0000 mL | Freq: Two times a day (BID) | ORAL | 0 refills | Status: DC | PRN

## 2022-04-06 MED ORDER — PREDNISONE 10 MG PO TABS: ORAL_TABLET | ORAL | 0 refills | Status: DC

## 2022-04-06 MED ORDER — BENZONATATE 200 MG PO CAPS: 200.0000 mg | ORAL_CAPSULE | Freq: Three times a day (TID) | ORAL | 1 refills | Status: DC | PRN

## 2022-04-06 NOTE — Progress Notes (Signed)
Subjective:    Patient ID: Suzanne Strong, female    DOB: 09-19-1945, 77 y.o.   MRN: [id number]  HPI Pt presents for cough and congestion  H/o recurrent asp pneumonia   Wt Readings from Last 3 Encounters:  04/06/22 218 lb (98.9 kg)  02/12/22 205 lb 2 oz (93 kg)  01/29/22 216 lb 9.6 oz (98.2 kg)   35.19 kg/m  Vitals:   04/06/22 1544  BP: 134/78  Pulse: 90  Temp: 97.7 F (36.5 C)  SpO2: 95%   Coughing  This seems different than her past  Unsure when it started   Tends to get hoarse in the afternoon  Then back cough since night   Home covid neg  Low grade temp 99.0 A little achey at night- not unusual for her  No chills   Sob is more recently  Some wheezing- inhaler helps    No nasal congestion  Ears and throat are ok   H/o recurrent choking and aspiration  This happens frequently  Has to avoid salt and pepper  Avoids things that crumble  Occ chokes on fluid   No recent heartburn-is under control   Fell at work last Monday- scraped her knees  A stretcher fell on top of her (was not locked)   Headache for a day / got better  Used some tessalon pearles    DG Chest 2 View  Result Date: 04/06/2022 CLINICAL DATA:  Cough wheezing EXAM: CHEST - 2 VIEW COMPARISON:  01/16/2022, CT 07/22/2021 FINDINGS: Chronic linear probable scarring at left base. No acute confluent airspace disease. Stable cardiomediastinal silhouette. Aortic atherosclerosis. No pneumothorax IMPRESSION: No active cardiopulmonary disease. Chronic scarring at the left base. Electronically Signed   By: Donavan Foil M.D.   On: 04/06/2022 16:23     Patient Active Problem List   Diagnosis Date Noted   Vision changes 02/12/2022   Polypharmacy 02/12/2022   Plant dermatitis 10/21/2021   Nausea & vomiting 04/23/2021   At high risk for aspiration 04/23/2021   COVID-19 03/18/2021   Dysuria 07/17/2020   Dysphagia    Gastric erythema    Gastric polyp    Encounter for screening colonoscopy     Polyp of colon    History of aspiration pneumonia 02/15/2020   Diabetes mellitus without complication (HCC)    GERD (gastroesophageal reflux disease)    Hypertension    Urinary urgency 01/05/2020   Grief reaction 09/18/2019   Swallowing dysfunction 07/18/2019   Cough 02/21/2019   Fever, intermittent 09/01/2018   Pre-syncope 02/07/2018   Orthostatic hypotension 01/26/2018   Episodic weakness 01/26/2018   Medicare annual wellness visit, subsequent 06/29/2017   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   Preoperative examination 04/27/2017   Carpal tunnel syndrome of right wrist 11/13/2016   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  02/17/2016   Obstructive sleep apnea 01/01/2016   Carcinoma of upper-outer quadrant of left breast in female, estrogen receptor negative ([place]) 11/21/2015   Hypomagnesemia 08/28/2015   Fever 04/11/2015   Initial Medicare annual wellness visit 10/16/2014   Estrogen deficiency 10/16/2014   Colon cancer screening 10/16/2014   Controlled type 2 diabetes mellitus without complication ([place]) [id number]   RLS (restless legs syndrome) 02/21/2014   Chronic neck pain 07/25/2010   Depression with anxiety 07/07/2010   Routine general medical examination at a health care facility 06/26/2010   B12 deficiency 09/04/2009   Anemia, iron deficiency 08/29/2009   Anxiety state 08/26/2009   GASTROPARESIS 08/26/2009   Vitamin  D deficiency 07/19/2008   Disorder of bone and cartilage 07/19/2007   Hyperlipidemia 07/18/2007   EDEMA 07/18/2007   Past Medical History:  Diagnosis Date   Anemia    iron deficiency and B12 deficiency   Anxiety    Breast cancer ([place]) 02/25/2015   upper-outer quadrant of left female breast, Triple Negative. Neo-adjuvant chemo with complete pathologic response.    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   BREAST CYST ASPIRATION     BREAST EXCISIONAL BIOPSY Left 12/2015   surgery   BREAST LUMPECTOMY WITH NEEDLE LOCALIZATION Left 10/02/2015   Procedure: BREAST LUMPECTOMY WITH NEEDLE LOCALIZATION;  Surgeon: [name], MD;  Location: [facility];  Service: General;  Laterality: Left;   BREAST LUMPECTOMY WITH SENTINEL LYMPH NODE BIOPSY Left 10/02/2015   Procedure: LEFT BREAST WIDE EXCISION WITH SENTINEL LYMPH NODE BX;  Surgeon: [name], MD;  Location: [facility];  Service: General;  Laterality: Left;   BREAST SURGERY     breast biopsy benign   CARPAL TUNNEL RELEASE Right 05/19/2017   Procedure: CARPAL TUNNEL RELEASE;  Surgeon: Earnestine Leys, MD;  Location: [facility];  Service: Orthopedics;  Laterality: Right;   CATARACT EXTRACTION W/PHACO Right 10/26/2018   Procedure: CATARACT EXTRACTION PHACO AND INTRAOCULAR LENS PLACEMENT ([place]) right diabetic;  Surgeon: Leandrew Koyanagi, MD;  Location: [facility];  Service: Ophthalmology;  Laterality: Right;  Diabetic - oral meds cancer center been notified and will come   CATARACT EXTRACTION W/PHACO Left 11/16/2018   Procedure: CATARACT EXTRACTION PHACO AND INTRAOCULAR LENS PLACEMENT ([facility]) LEFT DIABETIC  01:01.0  17.0%  10.37;  Surgeon: Leandrew Koyanagi, MD;  Location: [facility];  Service: Ophthalmology;  Laterality: Left;  Diabetic - oral meds Port-a-cath   CHOLECYSTECTOMY     Cologuard  07/22/2016   Negative   COLONOSCOPY WITH  PROPOFOL N/A 05/24/2020   Procedure: COLONOSCOPY WITH PROPOFOL;  Surgeon: Virgel Manifold, MD;  Location: [facility];  Service: Endoscopy;  Laterality: N/A;   ESOPHAGOGASTRODUODENOSCOPY  07/2009   normal with few gastric polyps   ESOPHAGOGASTRODUODENOSCOPY (EGD) WITH PROPOFOL N/A 05/24/2020   Procedure: ESOPHAGOGASTRODUODENOSCOPY (EGD) WITH PROPOFOL;  Surgeon: Virgel Manifold, MD;  Location: [facility];  Service: Endoscopy;  Laterality: N/A;   MOLE REMOVAL     PORT-A-CATH REMOVAL     PORTACATH PLACEMENT     PORTACATH PLACEMENT Right 03/12/2015   Procedure: INSERTION PORT-A-CATH;  Surgeon: [name], MD;  Location: [facility];  Service: General;  Laterality: Right;   RADICAL NECK DISSECTION     TONSILLECTOMY     cancer treated with chemo and radiation   TRIGGER FINGER RELEASE Right 05/19/2017   Procedure: RELEASE TRIGGER FINGER/A-1 PULLEY ring finger;  Surgeon: Earnestine Leys, MD;  Location: [facility];  Service: Orthopedics;  Laterality: Right;   Social History   Tobacco Use   Smoking status: Former    Packs/day: 0.50    Years: 6.00    Total pack years: 3.00    Types: Cigarettes    Quit date: 03/10/1984    Years since quitting: 38.0   Smokeless tobacco: Never  Vaping Use   Vaping Use: Never used  Substance Use Topics   Alcohol use: No    Alcohol/week: 0.0 standard drinks of alcohol   Drug use: No   Family History  Problem Relation Age of Onset   Osteoporosis Mother    Hyperlipidemia Mother    Depression Mother    Cancer Mother        skin cancer ? basal cell and lung ca heavy smoker   Alcohol abuse Father    Cancer Father        skin CA ? basal cell   Heart disease Father        CHF   Hyperlipidemia Brother    Hypertension Brother    Breast cancer Neg Hx    Allergies  Allergen Reactions   Amoxicillin Swelling    REACTION: rash, swelling Has patient had a PCN reaction causing immediate rash, facial/tongue/throat swelling, SOB or lightheadedness with  hypotension: yes Has patient had a PCN reaction causing severe rash involving mucus membranes or skin necrosis: no Has patient had a PCN reaction that required hospitalization: no Has patient had a PCN reaction occurring within the last 10 years: yes If all of the above answers are "NO", then may proceed with Cephalosporin use.  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     albuterol (VENTOLIN HFA) 108 (90 Base) MCG/ACT inhaler Inhale 2 puffs into the lungs every 6 (six) hours as needed for wheezing or shortness of breath. 8 g 3   ALPRAZolam (XANAX) 0.5 MG tablet Take 1 tablet (0.5 mg total) by mouth 2 (two) times daily as needed for anxiety. 60 tablet 2   amitriptyline (ELAVIL) 50 MG tablet Take 1 tablet (50 mg total) by mouth at bedtime. 90 tablet 3   amLODipine (NORVASC) 5 MG tablet Take 1 tablet (5 mg total) by mouth daily. 90 tablet 3   atorvastatin (LIPITOR) 10 MG tablet TAKE ONE TABLET BY MOUTH ONCE DAILY 90 tablet 3   b complex vitamins tablet Take 1 tablet by mouth daily.     BELSOMRA 20 MG TABS TAKE 1 TABLET BY MOUTH AT BEDTIME AS NEEDED 30 tablet 0   BIOTIN PO Take by mouth.     busPIRone (BUSPAR) 15 MG tablet Take 1.5 tablets (22.5 mg total) by mouth 2 (two) times daily. 270 tablet 0   Cholecalciferol (VITAMIN D-1000 MAX ST) 25 MCG (1000 UT) tablet Take 2,000 Units by mouth daily. Takes three 50 mcg tablets, 3 capsules daily     Emollient (COLLAGEN EX)      famotidine (PEPCID) 20 MG tablet  Take 1 tablet (20  mg total) by mouth 2 (two) times daily. 60 tablet 5   ferrous sulfate 325 (65 FE) MG tablet Take 1 tablet (325 mg total) by mouth 2 (two) times daily with a meal.     glipiZIDE (GLUCOTROL) 5 MG tablet Take 2 tablets (10 mg total) by mouth daily before breakfast.     glucose blood (ONETOUCH ULTRA) test strip USE TO check blood glucose ONCE DAILY AND AS NEEDED FOR DIABETES 100 strip 1   metFORMIN (GLUCOPHAGE) 500 MG tablet Take 2 tablets (1,000 mg total) by mouth 2 (two) times daily with a meal.     Multiple Vitamins-Minerals (WOMENS MULTIVITAMIN PO) Take by mouth.     omeprazole (PRILOSEC) 20 MG capsule TAKE ONE CAPSULE BY MOUTH EVERYDAY AT BEDTIME 90 capsule 3   ondansetron (ZOFRAN) 4 MG tablet Take 1 tablet (4 mg total) by mouth every 8 (eight) hours as needed for nausea or vomiting. 15 tablet 0   pregabalin (LYRICA) 150 MG capsule Take 300 mg by mouth at bedtime.     rOPINIRole (REQUIP) 1 MG tablet TAKE 1/2 TABLET BY MOUTH EVERY MORNING and TAKE THREE TABLETS BY MOUTH EVERYDAY AT BEDTIME 315 tablet 3   sertraline (ZOLOFT) 100 MG tablet TAKE TWO TABLETS BY MOUTH EVERY MORNING 60 tablet 1   zinc gluconate 50 MG tablet Take 50 mg by mouth daily.     Current Facility-Administered Medications on File Prior to Visit  Medication Dose Route Frequency Provider Last Rate Last Admin   influenza vaccine adjuvanted (FLUAD) injection 0.5 mL  0.5 mL Intramuscular Once Charlaine Dalton R, MD       sodium chloride flush (NS) 0.9 % injection 10 mL  10 mL Intravenous PRN Cammie Sickle, MD   10 mL at 09/11/15 0845    Review of Systems  Constitutional:  Positive for fatigue. Negative for activity change, appetite change, fever and unexpected weight change.       Occ low grade temp  HENT:  Negative for congestion, ear pain, rhinorrhea, sinus pressure and sore throat.   Eyes:  Negative for pain, redness and visual disturbance.  Respiratory:  Positive for cough, choking, chest tightness and wheezing.  Negative for shortness of breath and stridor.   Cardiovascular:  Negative for chest pain and palpitations.  Gastrointestinal:  Negative for abdominal pain, blood in stool, constipation and diarrhea.       No heartburn  Endocrine: Negative for polydipsia and polyuria.  Genitourinary:  Negative for dysuria, frequency and urgency.  Musculoskeletal:  Negative for arthralgias, back pain and myalgias.  Skin:  Negative for pallor and rash.  Allergic/Immunologic: Negative for environmental allergies.  Neurological:  Negative for dizziness, syncope and headaches.  Hematological:  Negative for adenopathy. Does not bruise/bleed easily.  Psychiatric/Behavioral:  Negative for decreased concentration and dysphoric mood. The patient is not nervous/anxious.        Objective:   Physical Exam Constitutional:      General: She is not in acute distress.    Appearance: Normal appearance. She is well-developed.  HENT:     Head: Normocephalic and atraumatic.     Right Ear: Tympanic membrane, ear canal and external ear normal.     Left Ear: Tympanic membrane, ear canal and external ear normal.     Nose: No congestion or rhinorrhea.     Mouth/Throat:     Mouth: Mucous membranes are moist.     Pharynx: No oropharyngeal exudate or posterior oropharyngeal erythema.  Eyes:     General:        Right eye: No discharge.        Left eye: No discharge.     Conjunctiva/sclera: Conjunctivae normal.     Pupils: Pupils are equal, round, and reactive to light.  Neck:     Thyroid: No thyromegaly.     Vascular: No carotid bruit or JVD.  Cardiovascular:     Rate and Rhythm: Normal rate and regular rhythm.     Heart sounds: Normal heart sounds.     No gallop.  Pulmonary:     Effort: Pulmonary effort is normal. No respiratory distress.     Breath sounds: Wheezing present. No rales.     Comments: Scattered wheezes worse with forced expiration  Some dry sounding crakles in LL lung field No rales Some upper airway  sounds noted    Chest:     Chest wall: No tenderness.  Abdominal:     General: There is no distension or abdominal bruit.     Palpations: Abdomen is soft.     Tenderness: There is no abdominal tenderness.  Musculoskeletal:     Cervical back: Normal range of motion and neck supple.     Right lower leg: No edema.     Left lower leg: No edema.  Lymphadenopathy:     Cervical: No cervical adenopathy.  Skin:    General: Skin is warm and dry.     Coloration: Skin is not pale.     Findings: No rash.  Neurological:     Mental Status: She is alert.     Coordination: Coordination normal.     Deep Tendon Reflexes: Reflexes are normal and symmetric. Reflexes normal.  Psychiatric:        Mood and Affect: Mood normal.           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

## 2022-04-06 NOTE — Patient Instructions (Signed)
Xray now  I will decide on treatment when we get the reading  If you worsen let us know If severe/ go to the ER  Use tussionex for cough as needed with caution of sedation (if the pharmacy has it,  good chance they will be out)   Try the tessalon pearles also    May consider a pulmonary consult  Will let your know

## 2022-04-06 NOTE — Assessment & Plan Note (Addendum)
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

## 2022-04-07 ENCOUNTER — Telehealth: Payer: Self-pay | Admitting: *Deleted

## 2022-04-07 NOTE — Telephone Encounter (Signed)
Called home phone twice and it was busy, called cell # and no answer and pt's VM box was full (no message left)

## 2022-04-07 NOTE — Telephone Encounter (Signed)
-----   Message from [name], MD sent at 04/06/2022  6:21 PM EST ----- Your chest xray looks clear (with the same area of stable scarring)  I sent in prednisone to your pharmacy Take as directed If cough is not significantly improved after that let us know and I will place a referral for pulmonary in [place]  Please keep me posted

## 2022-04-10 ENCOUNTER — Other Ambulatory Visit: Payer: Self-pay | Admitting: Psychiatry

## 2022-04-10 DIAGNOSIS — F411 Generalized anxiety disorder: Secondary | ICD-10-CM

## 2022-04-14 DIAGNOSIS — M1712 Unilateral primary osteoarthritis, left knee: Secondary | ICD-10-CM | POA: Diagnosis not present

## 2022-04-27 ENCOUNTER — Encounter: Payer: Self-pay | Admitting: Internal Medicine

## 2022-04-27 MED ORDER — CEFDINIR 300 MG PO CAPS: 300.0000 mg | ORAL_CAPSULE | Freq: Two times a day (BID) | ORAL | 0 refills | Status: DC

## 2022-04-27 NOTE — Telephone Encounter (Signed)
Patient notified by telephone that Dr. Glori Bickers is sending her medication in today to start taking. Patient was given ER precautions and she verbalized understanding. Patient scheduled for an office visit tomorrow 04/28/22 at 12:00 with Dr. Glori Bickers.

## 2022-04-27 NOTE — Addendum Note (Signed)
Addended by: [name] A on: 04/27/2022 01:17 PM   Modules accepted: Orders

## 2022-04-27 NOTE — Telephone Encounter (Signed)
Please call her and see what symptoms are/ triage Thanks

## 2022-04-27 NOTE — Telephone Encounter (Signed)
Spoke to patient by telephone and was advised that she is at work.  It was hard to communicate with the patient because she was coughing and wheezing while on the phone. Patient stated that she finished the Prednisone Friday. Patient stated that she has a terrible non -productive cough. Patient denies a fever. Patient stated that she does have SOB and wheezing which I could hear over the phone.

## 2022-04-27 NOTE — Telephone Encounter (Signed)
I sent in omnicef (she is all to pcn but does well with this in the past)  She has h/o asp pneumonia from swallowing dysfunction Please give ER precautions   Get appt with [facility] tomorrow thanks

## 2022-04-27 NOTE — Telephone Encounter (Signed)
Pt called stating she still having issues with pneumonia, she still feels bad. Pt requested a call back pt gave two numbers to call, [phone]

## 2022-04-28 ENCOUNTER — Ambulatory Visit (INDEPENDENT_AMBULATORY_CARE_PROVIDER_SITE_OTHER)
Admission: RE | Admit: 2022-04-28 | Discharge: 2022-04-28 | Disposition: A | Discharge status: Home / Self Care | Payer: HMO | Source: Ambulatory Visit | Attending: Family Medicine | Admitting: Family Medicine

## 2022-04-28 ENCOUNTER — Ambulatory Visit (INDEPENDENT_AMBULATORY_CARE_PROVIDER_SITE_OTHER): Payer: HMO | Admitting: Family Medicine

## 2022-04-28 ENCOUNTER — Encounter: Payer: Self-pay | Admitting: Family Medicine

## 2022-04-28 VITALS — BP 136/72 | HR 50 | Temp 97.7°F | Ht 66.0 in | Wt 218.1 lb

## 2022-04-28 DIAGNOSIS — R052 Subacute cough: Secondary | ICD-10-CM

## 2022-04-28 DIAGNOSIS — R1310 Dysphagia, unspecified: Secondary | ICD-10-CM | POA: Diagnosis not present

## 2022-04-28 DIAGNOSIS — R2689 Other abnormalities of gait and mobility: Secondary | ICD-10-CM | POA: Diagnosis not present

## 2022-04-28 DIAGNOSIS — R059 Cough, unspecified: Secondary | ICD-10-CM | POA: Diagnosis not present

## 2022-04-28 NOTE — Progress Notes (Signed)
Subjective:    Patient ID: Suzanne Strong, female    DOB: 07/08/1945, 77 y.o.   MRN: [id number]  HPI Pt presents with worsening cough in setting of chronic aspiration  Also poor balance -falls frequently    Wt Readings from Last 3 Encounters:  04/28/22 218 lb 2 oz (98.9 kg)  04/06/22 218 lb (98.9 kg)  02/12/22 205 lb 2 oz (93 kg)   35.21 kg/m  Vitals:   04/28/22 1155  BP: 136/72  Pulse: (!) 50  Temp: 97.7 F (36.5 C)  SpO2: 96%   Seen on 2/19 Px prednisone   Cxr DG Chest 2 View (Accession [id number]) (Order [id number]) Imaging Date: 04/06/2022 Department: [place] [facility] Ordering/Authorizing: Kahlani Graber, [name], MD   Exam Status  Status  Final [99]   PACS Intelerad Image Link   Show images for DG Chest 2 View Study Result  Narrative & Impression  CLINICAL DATA:  Cough wheezing   EXAM: CHEST - 2 VIEW   COMPARISON:  01/16/2022, CT 07/22/2021   FINDINGS: Chronic linear probable scarring at left base. No acute confluent airspace disease. Stable cardiomediastinal silhouette. Aortic atherosclerosis. No pneumothorax   IMPRESSION: No active cardiopulmonary disease. Chronic scarring at the left base   Called yesterday Is worse since end of next week  We sent in [facility]  She did improved a little and then worse again  Non prod cough Is always hoarse Cough hurts and makes her throat sore Hears a rattle   Only sob when she moves a lot  A little exp sound - wheeze ?  No fever   No cold symptoms    Poor balance  Has pain in L leg at times  Trouble turning  Did PT_not helpful   Today -no acute changes in cxr DG Chest 2 View  Result Date: 04/28/2022 CLINICAL DATA:  Subacute cough. EXAM: CHEST - 2 VIEW COMPARISON:  04/06/2022 FINDINGS: Cardiopericardial silhouette is at upper limits of normal for size. Interstitial markings are diffusely coarsened with chronic features. Similar appearance of basilar scarring on the  left. No pleural effusion. The visualized bony structures of the thorax are unremarkable. IMPRESSION: Chronic interstitial coarsening with left base scarring. No new or acute cardiopulmonary findings. Electronically Signed   By: Misty Stanley M.D.   On: 04/28/2022 12:49   DG Chest 2 View  Result Date: 04/06/2022 CLINICAL DATA:  Cough wheezing EXAM: CHEST - 2 VIEW COMPARISON:  01/16/2022, CT 07/22/2021 FINDINGS: Chronic linear probable scarring at left base. No acute confluent airspace disease. Stable cardiomediastinal silhouette. Aortic atherosclerosis. No pneumothorax IMPRESSION: No active cardiopulmonary disease. Chronic scarring at the left base. Electronically Signed   By: Donavan Foil M.D.   On: 04/06/2022 16:23    Patient Active Problem List   Diagnosis Date Noted   Poor balance 04/28/2022   Vision changes 02/12/2022   Polypharmacy 02/12/2022   Plant dermatitis 10/21/2021   Nausea & vomiting 04/23/2021   At high risk for aspiration 04/23/2021   COVID-19 03/18/2021   Dysuria 07/17/2020   Dysphagia    Gastric erythema    Gastric polyp    Encounter for screening colonoscopy    Polyp of colon    History of aspiration pneumonia 02/15/2020   Diabetes mellitus without complication (HCC)    GERD (gastroesophageal reflux disease)    Hypertension    Urinary urgency 01/05/2020   Grief reaction 09/18/2019   Swallowing dysfunction 07/18/2019   Cough 02/21/2019   Fever, intermittent  09/01/2018   Pre-syncope 02/07/2018   Orthostatic hypotension 01/26/2018   Episodic weakness 01/26/2018   Medicare annual wellness visit, subsequent 06/29/2017   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   Preoperative examination 04/27/2017   Carpal tunnel syndrome of right wrist 11/13/2016   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/2016   Obstructive sleep apnea 01/01/2016   Carcinoma of upper-outer quadrant of left breast in female, estrogen receptor negative ([place]) 11/21/2015   Hypomagnesemia  08/28/2015   Fever 04/11/2015   Initial Medicare annual wellness visit 10/16/2014   Estrogen deficiency 10/16/2014   Colon cancer screening 10/16/2014   Controlled type 2 diabetes mellitus without complication ([place]) [id number]   RLS (restless legs syndrome) 02/21/2014   Chronic neck pain 07/25/2010   Depression with anxiety 07/07/2010   Routine general medical examination at a health care facility 06/26/2010   B12 deficiency 09/04/2009   Anemia, iron deficiency 08/29/2009   Anxiety state 08/26/2009   GASTROPARESIS 08/26/2009   Vitamin D deficiency 07/19/2008   Disorder of bone and cartilage 07/19/2007   Hyperlipidemia 07/18/2007   EDEMA 07/18/2007   Past Medical History:  Diagnosis Date   Anemia    iron deficiency and B12 deficiency   Anxiety    Breast cancer ([place]) 02/25/2015   upper-outer quadrant of left female breast, Triple Negative. Neo-adjuvant chemo with complete pathologic response.    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   BREAST CYST ASPIRATION     BREAST EXCISIONAL BIOPSY Left 12/2015   surgery   BREAST LUMPECTOMY WITH NEEDLE LOCALIZATION Left 10/02/2015   Procedure: BREAST LUMPECTOMY WITH NEEDLE LOCALIZATION;  Surgeon: [name], MD;  Location: [facility];  Service: General;  Laterality: Left;   BREAST LUMPECTOMY WITH SENTINEL  LYMPH NODE BIOPSY Left 10/02/2015   Procedure: LEFT BREAST WIDE EXCISION WITH SENTINEL LYMPH NODE BX;  Surgeon: [name], MD;  Location: [facility];  Service: General;  Laterality: Left;   BREAST SURGERY     breast biopsy benign   CARPAL TUNNEL RELEASE Right 05/19/2017   Procedure: CARPAL TUNNEL RELEASE;  Surgeon: Earnestine Leys, MD;  Location: [facility];  Service: Orthopedics;  Laterality: Right;   CATARACT EXTRACTION W/PHACO Right 10/26/2018   Procedure: CATARACT EXTRACTION PHACO AND INTRAOCULAR LENS PLACEMENT ([place]) right diabetic;  Surgeon: Leandrew Koyanagi, MD;  Location: [place];  Service: Ophthalmology;  Laterality: Right;  Diabetic - oral meds cancer center been notified and will come   CATARACT EXTRACTION W/PHACO Left 11/16/2018   Procedure: CATARACT EXTRACTION PHACO AND INTRAOCULAR LENS PLACEMENT ([facility]) LEFT DIABETIC  01:01.0  17.0%  10.37;  Surgeon: Leandrew Koyanagi, MD;  Location: [place];  Service: Ophthalmology;  Laterality: Left;  Diabetic - oral meds Port-a-cath   CHOLECYSTECTOMY  Cologuard  07/22/2016   Negative   COLONOSCOPY WITH PROPOFOL N/A 05/24/2020   Procedure: COLONOSCOPY WITH PROPOFOL;  Surgeon: Virgel Manifold, MD;  Location: [facility];  Service: Endoscopy;  Laterality: N/A;   ESOPHAGOGASTRODUODENOSCOPY  07/2009   normal with few gastric polyps   ESOPHAGOGASTRODUODENOSCOPY (EGD) WITH PROPOFOL N/A 05/24/2020   Procedure: ESOPHAGOGASTRODUODENOSCOPY (EGD) WITH PROPOFOL;  Surgeon: Virgel Manifold, MD;  Location: [facility];  Service: Endoscopy;  Laterality: N/A;   MOLE REMOVAL     PORT-A-CATH REMOVAL     PORTACATH PLACEMENT     PORTACATH PLACEMENT Right 03/12/2015   Procedure: INSERTION PORT-A-CATH;  Surgeon: [name], MD;  Location: [facility];  Service: General;  Laterality: Right;   RADICAL NECK DISSECTION     TONSILLECTOMY     cancer treated with chemo and radiation   TRIGGER FINGER RELEASE Right 05/19/2017    Procedure: RELEASE TRIGGER FINGER/A-1 PULLEY ring finger;  Surgeon: Earnestine Leys, MD;  Location: [facility];  Service: Orthopedics;  Laterality: Right;   Social History   Tobacco Use   Smoking status: Former    Packs/day: 0.50    Years: 6.00    Total pack years: 3.00    Types: Cigarettes    Quit date: 03/10/1984    Years since quitting: 38.1   Smokeless tobacco: Never  Vaping Use   Vaping Use: Never used  Substance Use Topics   Alcohol use: No    Alcohol/week: 0.0 standard drinks of alcohol   Drug use: No   Family History  Problem Relation Age of Onset   Osteoporosis Mother    Hyperlipidemia Mother    Depression Mother    Cancer Mother        skin cancer ? basal cell and lung ca heavy smoker   Alcohol abuse Father    Cancer Father        skin CA ? basal cell   Heart disease Father        CHF   Hyperlipidemia Brother    Hypertension Brother    Breast cancer Neg Hx    Allergies  Allergen Reactions   Amoxicillin Swelling    REACTION: rash, swelling Has patient had a PCN reaction causing immediate rash, facial/tongue/throat swelling, SOB or lightheadedness with hypotension: yes Has patient had a PCN reaction causing severe rash involving mucus membranes or skin necrosis: no Has patient had a PCN reaction that required hospitalization: no Has patient had a PCN reaction occurring within the last 10 years: yes If all of the above answers are "NO", then may proceed with Cephalosporin use.  Has tolerated ceftriaxone   Penicillin G Anaphylaxis    Has tolerated ceftriaxone on multiple occasions    Fluconazole Rash    REACTION: hives   Difuleron [Ferrous Fumarate-Dss]    Other Other (See Comments)    Pt has had tonsil cancer (on Left side) - pt may have difficulty swallowing   Current Outpatient Medications on File Prior to Visit  Medication Sig Dispense Refill   acetaminophen (TYLENOL) 325 MG tablet Take 2 tablets (650 mg total) by mouth every 6 (six) hours as needed for  mild pain (or Fever >/= 101).     albuterol (VENTOLIN HFA) 108 (90 Base) MCG/ACT inhaler Inhale 2 puffs into the lungs every 6 (six) hours as needed for wheezing or shortness of breath. 8 g 3   ALPRAZolam (XANAX) 0.5 MG tablet Take 1 tablet (0.5 mg total) by mouth 2 (two) times daily as needed for  anxiety. 60 tablet 2   amitriptyline (ELAVIL) 50 MG tablet Take 1 tablet (50 mg total) by mouth at bedtime. 90 tablet 3   amLODipine (NORVASC) 5 MG tablet Take 1 tablet (5 mg total) by mouth daily. 90 tablet 3   atorvastatin (LIPITOR) 10 MG tablet TAKE ONE TABLET BY MOUTH ONCE DAILY 90 tablet 3   b complex vitamins tablet Take 1 tablet by mouth daily.     BELSOMRA 20 MG TABS TAKE 1 TABLET BY MOUTH AT BEDTIME AS NEEDED 30 tablet 0   benzonatate (TESSALON) 200 MG capsule Take 1 capsule (200 mg total) by mouth 3 (three) times daily as needed for cough. Swallow whole 30 capsule 1   BIOTIN PO Take by mouth.     busPIRone (BUSPAR) 15 MG tablet TAKE 1 & 1/2 (ONE & ONE-HALF) TABLETS BY MOUTH TWICE DAILY 90 tablet 0   cefdinir (OMNICEF) 300 MG capsule Take 1 capsule (300 mg total) by mouth 2 (two) times daily. 14 capsule 0   chlorpheniramine-HYDROcodone (TUSSIONEX) 10-8 MG/5ML Take 5 mLs by mouth every 12 (twelve) hours as needed for cough. Caution of sedation 50 mL 0   Cholecalciferol (VITAMIN D-1000 MAX ST) 25 MCG (1000 UT) tablet Take 2,000 Units by mouth daily. Takes three 50 mcg tablets, 3 capsules daily     Emollient (COLLAGEN EX)      famotidine (PEPCID) 20 MG tablet Take 1 tablet (20 mg total) by mouth 2 (two) times daily. 60 tablet 5   ferrous sulfate 325 (65 FE) MG tablet Take 1 tablet (325 mg total) by mouth 2 (two) times daily with a meal.     glipiZIDE (GLUCOTROL) 5 MG tablet Take 2 tablets (10 mg total) by mouth daily before breakfast.     glucose blood (ONETOUCH ULTRA) test strip USE TO check blood glucose ONCE DAILY AND AS NEEDED FOR DIABETES 100 strip 1   metFORMIN (GLUCOPHAGE) 500 MG tablet Take  2 tablets (1,000 mg total) by mouth 2 (two) times daily with a meal.     Multiple Vitamins-Minerals (WOMENS MULTIVITAMIN PO) Take by mouth.     omeprazole (PRILOSEC) 20 MG capsule TAKE ONE CAPSULE BY MOUTH EVERYDAY AT BEDTIME 90 capsule 3   ondansetron (ZOFRAN) 4 MG tablet Take 1 tablet (4 mg total) by mouth every 8 (eight) hours as needed for nausea or vomiting. 15 tablet 0   predniSONE (DELTASONE) 10 MG tablet Take 4 pills once daily by mouth for 3 days, then 3 pills daily for 3 days, then 2 pills daily for 3 days then 1 pill daily for 3 days then stop 30 tablet 0   pregabalin (LYRICA) 150 MG capsule Take 300 mg by mouth at bedtime.     rOPINIRole (REQUIP) 1 MG tablet TAKE 1/2 TABLET BY MOUTH EVERY MORNING and TAKE THREE TABLETS BY MOUTH EVERYDAY AT BEDTIME 315 tablet 3   sertraline (ZOLOFT) 100 MG tablet TAKE TWO TABLETS BY MOUTH EVERY MORNING 60 tablet 1   zinc gluconate 50 MG tablet Take 50 mg by mouth daily.     Current Facility-Administered Medications on File Prior to Visit  Medication Dose Route Frequency Provider Last Rate Last Admin   influenza vaccine adjuvanted (FLUAD) injection 0.5 mL  0.5 mL Intramuscular Once Charlaine Dalton R, MD       sodium chloride flush (NS) 0.9 % injection 10 mL  10 mL Intravenous PRN Cammie Sickle, MD   10 mL at 09/11/15 0845    Review of Systems  Constitutional:  Positive for fatigue. Negative for activity change, appetite change, fever and unexpected weight change.  HENT:  Negative for congestion, ear pain, rhinorrhea, sinus pressure and sore throat.   Eyes:  Negative for pain, redness and visual disturbance.  Respiratory:  Positive for cough, choking and wheezing. Negative for shortness of breath and stridor.   Cardiovascular:  Negative for chest pain and palpitations.  Gastrointestinal:  Negative for abdominal pain, blood in stool, constipation and diarrhea.  Endocrine: Negative for polydipsia and polyuria.  Genitourinary:  Negative for  dysuria, frequency and urgency.  Musculoskeletal:  Negative for arthralgias, back pain and myalgias.  Skin:  Negative for pallor and rash.  Allergic/Immunologic: Negative for environmental allergies.  Neurological:  Negative for dizziness, syncope and headaches.  Hematological:  Negative for adenopathy. Does not bruise/bleed easily.  Psychiatric/Behavioral:  Negative for decreased concentration and dysphoric mood. The patient is not nervous/anxious.        Objective:   Physical Exam Constitutional:      Appearance: Normal appearance. She is obese.  HENT:     Head: Normocephalic and atraumatic.     Right Ear: Tympanic membrane and ear canal normal.     Left Ear: Tympanic membrane and ear canal normal.     Nose: Nose normal.     Comments: Boggy nares     Mouth/Throat:     Comments: Baseline dry mm in mouth   Baseline harsh voice  Eyes:     General:        Right eye: No discharge.        Left eye: No discharge.     Conjunctiva/sclera: Conjunctivae normal.     Pupils: Pupils are equal, round, and reactive to light.  Cardiovascular:     Rate and Rhythm: Regular rhythm. Bradycardia present.     Heart sounds: Normal heart sounds. No murmur heard. Pulmonary:     Effort: Pulmonary effort is normal. No respiratory distress.     Breath sounds: Normal breath sounds.     Comments: Some dry crackling in bases worse on left  No rales  Wheeze only on forced exp Some scattered rhonchi Chest:     Chest wall: No tenderness.  Abdominal:     General: There is no distension.     Palpations: Abdomen is soft. There is no mass.  Musculoskeletal:     Cervical back: Neck supple.  Lymphadenopathy:     Cervical: No cervical adenopathy.  Skin:    General: Skin is warm and dry.     Coloration: Skin is not jaundiced.     Findings: No rash.  Neurological:     Mental Status: She is alert.     Cranial Nerves: No cranial nerve deficit.     Motor: No weakness.     Deep Tendon Reflexes: Reflexes  normal.  Psychiatric:        Mood and Affect: Mood normal.           Assessment & Plan:   Problem List Items Addressed This Visit       Other   Cough - Primary    Ongoing In setting of chronic aspiration (she declines G tube) Now now productive With some reactive airways at times/ did not improve with last prednisone No change in exam except for more dry crackle sounds at bases  Pulse ox is nl on RA  Csr-notes chronic intestitial coursening with L base scarring  No infiltrate Is reassuring  Disc sympt control Also ER precautions  Will ref to pulmonary for eval and help with treatment          Relevant Orders   DG Chest 2 View (Completed)   Poor balance    This may be multi factorial Numerous falls Is deconditioned Per pt- PT for this did help   Today px for 4 wheeled walker done to take to med supply  I highly recommend use of this on days she feels unsteady      Relevant Orders   For home use only DME 4 wheeled rolling walker with seat [id number])

## 2022-04-28 NOTE — Assessment & Plan Note (Signed)
Continue to adv thickening liquids Declines G tube

## 2022-04-28 NOTE — Assessment & Plan Note (Signed)
Ongoing In setting of chronic aspiration (she declines G tube) Now now productive With some reactive airways at times/ did not improve with last prednisone No change in exam except for more dry crackle sounds at bases  Pulse ox is nl on RA  Csr-notes chronic intestitial coursening with L base scarring  No infiltrate Is reassuring  Disc sympt control Also ER precautions   Will ref to pulmonary for eval and help with treatment

## 2022-04-28 NOTE — Patient Instructions (Addendum)
Chest xray now  Will advise from there   If any symptoms worsen please let me know   I will put in a referral to pulmonary medicine in [place] to discuss ongoing cough and breathing issues I put the referral in  Please let us know if you don't hear in 1-2 weeks     You can take the px for wheeled walker to any medical supply company

## 2022-04-28 NOTE — Assessment & Plan Note (Signed)
This may be multi factorial Numerous falls Is deconditioned Per pt- PT for this did help   Today px for 4 wheeled walker done to take to med supply  I highly recommend use of this on days she feels unsteady

## 2022-05-01 ENCOUNTER — Encounter: Payer: Self-pay | Admitting: Family Medicine

## 2022-05-04 ENCOUNTER — Encounter: Payer: Self-pay | Admitting: Psychiatry

## 2022-05-04 ENCOUNTER — Ambulatory Visit (INDEPENDENT_AMBULATORY_CARE_PROVIDER_SITE_OTHER): Payer: HMO | Admitting: Psychiatry

## 2022-05-04 DIAGNOSIS — G4700 Insomnia, unspecified: Secondary | ICD-10-CM | POA: Diagnosis not present

## 2022-05-04 DIAGNOSIS — F411 Generalized anxiety disorder: Secondary | ICD-10-CM | POA: Diagnosis not present

## 2022-05-04 MED ORDER — ALPRAZOLAM 0.5 MG PO TABS: 0.5000 mg | ORAL_TABLET | Freq: Two times a day (BID) | ORAL | 5 refills | Status: DC | PRN

## 2022-05-04 MED ORDER — BUSPIRONE HCL 15 MG PO TABS: ORAL_TABLET | ORAL | 1 refills | Status: DC

## 2022-05-04 MED ORDER — TEMAZEPAM 15 MG PO CAPS: 15.0000 mg | ORAL_CAPSULE | Freq: Every evening | ORAL | 5 refills | Status: DC | PRN

## 2022-05-04 MED ORDER — SERTRALINE HCL 100 MG PO TABS: 200.0000 mg | ORAL_TABLET | Freq: Every morning | ORAL | 1 refills | Status: DC

## 2022-05-04 NOTE — Progress Notes (Signed)
Suzanne Strong HC:[id number] [birth year] 77 y.o.  Subjective:   Patient ID:  Suzanne Strong is a 77 y.o. (DOB 1946/01/30) female.  Chief Complaint:  Chief Complaint  Patient presents with   Follow-up    Anxiety, insomnia    HPI Suzanne Strong presents to the office today for follow-up of anxiety and insomnia. Suzanne Strong reports that Suzanne Strong has had pneumonia 4 times this year.   Suzanne Strong reports that Suzanne Strong has been using Temazepam has been helpful for her sleep. Suzanne Strong reports that it helps her fall asleep quickly and Suzanne Strong has been able to stay asleep. Suzanne Strong reports that Suzanne Strong falls asleep around 11 pm, awakens once to use the bathroom, and returns to sleep around 5-6 am. Suzanne Strong reports that Suzanne Strong does not think Temazepam has caused any unsteadiness.   Suzanne Strong reports that Suzanne Strong has not needed to take Alprazolam prn recently. Suzanne Strong reports that her anxiety has "been good. I haven't had any anxiety feelings at all." Reports that anxiety has been less since increase in Buspar. Denies depressed mood. Suzanne Strong reports that her energy and motivation have been good. Concentration has been adequate. Suzanne Strong is planning to learn how to quilt. Recently went to a quilt show. Denies SI.   Suzanne Strong reports that RLS has been ok. Suzanne Strong reports that Suzanne Strong tries to fall asleep before restlessness starts "because they don't ever wake me up."   Suzanne Strong reports that Suzanne Strong has had a few falls recently. Suzanne Strong reports that Suzanne Strong has some difficulty with balance after sudden movement.   Husband died in 2019-06-03. Suzanne Strong reports that a few times recently Suzanne Strong has started to call out for him. Suzanne Strong reports that Suzanne Strong has a good support system with friends and church family. One friend calls her daily.   Suzanne Strong enjoys her dog, a Fransisco Beau terrier. Suzanne Strong enjoys cooking. Continues to work. 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   Alprazolam last filled 04/14/22 x 3.  Temazepam last filled 04/14/22   Past Psychiatric Medication  Trials: Remeron Xanax Gabapentin- Unsure if this is helpful for RLS Lyrica Ropinorole- Helpful for RLS Zoloft Buspar Lyrica Trazodone-Ineffective. Caused unsteadiness.  Amitriptyline Belsomra-Effective.   [place] Office Visit from 04/06/2022 in [place] at [facility]  Total GAD-7 Score 4      Mini-Mental    [place] from 07/15/2018 in [place] at [place] from 06/29/2017 in [place] at [place] from 06/25/2016 in [place] at [facility]  Total Score (max 30 points ) 17 20 20       [place]    [place] Visit from 04/06/2022 in [place] at [place] Visit from 01/29/2022 in [place] at [place] from 11/27/2021 in [place] at [place] Visit from 09/22/2021 in [place] at [facility] Video Visit from 07/04/2021 in [place] at [facility]  PHQ-2 Total Score 0 0 0 0 0  PHQ-9 Total Score 6 -- -- -- 2      Flowsheet Row ED from 10/30/2021 in [facility] Emergency Department at [facility] ED from 07/22/2021 in [facility] Emergency Department at [facility] ED to Hosp-Admission (Discharged) from 12/09/2020 in [place] No Risk  No Risk No Risk        Review of Systems:  Review of Systems  Respiratory:  Positive for cough.   Musculoskeletal:  Negative for gait problem.  Psychiatric/Behavioral:         Please refer to HPI    Medications: I have reviewed the patient's current medications.  Current Outpatient Medications  Medication Sig Dispense Refill   acetaminophen (TYLENOL) 325 MG tablet Take 2 tablets (650 mg total) by mouth every 6 (six) hours as needed for mild pain (or Fever  >/= 101).     albuterol (VENTOLIN HFA) 108 (90 Base) MCG/ACT inhaler Inhale 2 puffs into the lungs every 6 (six) hours as needed for wheezing or shortness of breath. 8 g 3   amitriptyline (ELAVIL) 50 MG tablet Take 1 tablet (50 mg total) by mouth at bedtime. 90 tablet 3   amLODipine (NORVASC) 5 MG tablet Take 1 tablet (5 mg total) by mouth daily. 90 tablet 3   atorvastatin (LIPITOR) 10 MG tablet TAKE ONE TABLET BY MOUTH ONCE DAILY 90 tablet 3   b complex vitamins tablet Take 1 tablet by mouth daily.     benzonatate (TESSALON) 200 MG capsule Take 1 capsule (200 mg total) by mouth 3 (three) times daily as needed for cough. Swallow whole 30 capsule 1   BIOTIN PO Take by mouth.     cefdinir (OMNICEF) 300 MG capsule Take 1 capsule (300 mg total) by mouth 2 (two) times daily. 14 capsule 0   chlorpheniramine-HYDROcodone (TUSSIONEX) 10-8 MG/5ML Take 5 mLs by mouth every 12 (twelve) hours as needed for cough. Caution of sedation 50 mL 0   Cholecalciferol (VITAMIN D-1000 MAX ST) 25 MCG (1000 UT) tablet Take 2,000 Units by mouth daily. Takes three 50 mcg tablets, 3 capsules daily     Emollient (COLLAGEN EX)      famotidine (PEPCID) 20 MG tablet Take 1 tablet (20 mg total) by mouth 2 (two) times daily. 60 tablet 5   ferrous sulfate 325 (65 FE) MG tablet Take 1 tablet (325 mg total) by mouth 2 (two) times daily with a meal.     glipiZIDE (GLUCOTROL) 5 MG tablet Take 2 tablets (10 mg total) by mouth daily before breakfast.     metFORMIN (GLUCOPHAGE) 500 MG tablet Take 2 tablets (1,000 mg total) by mouth 2 (two) times daily with a meal.     Multiple Vitamins-Minerals (WOMENS MULTIVITAMIN PO) Take by mouth.     omeprazole (PRILOSEC) 20 MG capsule TAKE ONE CAPSULE BY MOUTH EVERYDAY AT BEDTIME 90 capsule 3   OZEMPIC, 0.25 OR 0.5 MG/DOSE, 2 MG/3ML SOPN Inject into the skin.     pregabalin (LYRICA) 150 MG capsule Take 300 mg by mouth at bedtime.     rOPINIRole (REQUIP) 1 MG tablet TAKE 1/2 TABLET BY MOUTH EVERY  MORNING and TAKE THREE TABLETS BY MOUTH EVERYDAY AT BEDTIME 315 tablet 3   zinc gluconate 50 MG tablet Take 50 mg by mouth daily.     [START ON 05/12/2022] ALPRAZolam (XANAX) 0.5 MG tablet Take 1 tablet (0.5 mg total) by mouth 2 (two) times daily as needed for anxiety. 60 tablet 5   busPIRone (BUSPAR) 15 MG tablet Take 1.5 tabs po BID 270 tablet 1   glucose blood (ONETOUCH ULTRA) test strip USE TO check blood glucose ONCE DAILY AND AS NEEDED FOR DIABETES 100 strip 1   ondansetron (ZOFRAN) 4 MG tablet Take 1 tablet (4 mg total) by mouth every 8 (eight) hours  as needed for nausea or vomiting. 15 tablet 0   predniSONE (DELTASONE) 10 MG tablet Take 4 pills once daily by mouth for 3 days, then 3 pills daily for 3 days, then 2 pills daily for 3 days then 1 pill daily for 3 days then stop (Patient not taking: Reported on 05/04/2022) 30 tablet 0   sertraline (ZOLOFT) 100 MG tablet Take 2 tablets (200 mg total) by mouth every morning. 180 tablet 1   [START ON 05/12/2022] temazepam (RESTORIL) 15 MG capsule Take 1 capsule (15 mg total) by mouth at bedtime as needed for sleep. 30 capsule 5   No current facility-administered medications for this visit.   Facility-Administered Medications Ordered in Other Visits  Medication Dose Route Frequency Provider Last Rate Last Admin   influenza vaccine adjuvanted (FLUAD) injection 0.5 mL  0.5 mL Intramuscular Once Charlaine Dalton R, MD       sodium chloride flush (NS) 0.9 % injection 10 mL  10 mL Intravenous PRN Cammie Sickle, MD   10 mL at 09/11/15 0845    Medication Side Effects: None  Allergies:  Allergies  Allergen Reactions   Amoxicillin Swelling    REACTION: rash, swelling Has patient had a PCN reaction causing immediate rash, facial/tongue/throat swelling, SOB or lightheadedness with hypotension: yes Has patient had a PCN reaction causing severe rash involving mucus membranes or skin necrosis: no Has patient had a PCN reaction that required  hospitalization: no Has patient had a PCN reaction occurring within the last 10 years: yes If all of the above answers are "NO", then may proceed with Cephalosporin use.  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 Neo-adjuvant chemo with complete pathologic response.    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   Please see review of systems for further details on the patient's review from today.   Objective:   Physical Exam:  There were no vitals taken for this visit.  Physical Exam Constitutional:      General: Suzanne Strong is not in acute distress. Musculoskeletal:        General: No deformity.  Neurological:     Mental Status: Suzanne Strong is alert and oriented to person, place, and time.     Coordination: Coordination normal.  Psychiatric:  Attention and Perception: Attention and perception normal. Suzanne Strong does not perceive auditory or visual  hallucinations.        Mood and Affect: Mood normal. Mood is not anxious or depressed. Affect is not labile, blunt, angry or inappropriate.        Speech: Speech normal.        Behavior: Behavior normal.        Thought Content: Thought content normal. Thought content is not paranoid or delusional. Thought content does not include homicidal or suicidal ideation. Thought content does not include homicidal or suicidal plan.        Cognition and Memory: Cognition and memory normal.        Judgment: Judgment normal.     Comments: Insight intact     Lab Review:     Component Value Date/Time   NA 137 02/12/2022 1221   NA 142 03/01/2014 0846   K 3.7 02/12/2022 1221   K 4.3 03/01/2014 0846   CL 96 02/12/2022 1221   CL 102 03/01/2014 0846   CO2 32 02/12/2022 1221   CO2 28 03/01/2014 0846   GLUCOSE 161 (H) 02/12/2022 1221   GLUCOSE 219 (H) 03/01/2014 0846   BUN 20 02/12/2022 1221   BUN 12 03/01/2014 0846   CREATININE 1.15 02/12/2022 1221   CREATININE 0.95 03/01/2014 0846   CALCIUM 8.6 02/12/2022 1221   CALCIUM 8.6 03/01/2014 0846   PROT 6.9 02/12/2022 1221   PROT 7.5 03/01/2014 0846   ALBUMIN 3.8 02/12/2022 1221   ALBUMIN 3.4 03/01/2014 0846   AST 16 02/12/2022 1221   AST 20 03/01/2014 0846   ALT 11 02/12/2022 1221   ALT 28 03/01/2014 0846   ALKPHOS 64 02/12/2022 1221   ALKPHOS 122 (H) 03/01/2014 0846   BILITOT 0.3 02/12/2022 1221   BILITOT 0.2 03/01/2014 0846   GFRNONAA >60 11/26/2021 1255   GFRNONAA >60 03/01/2014 0846   GFRNONAA >60 08/24/2013 1015   GFRAA >60 10/20/2019 0950   GFRAA >60 03/01/2014 0846   GFRAA >60 08/24/2013 1015       Component Value Date/Time   WBC 6.2 02/12/2022 1221   RBC 4.55 02/12/2022 1221   HGB 10.9 (L) 02/12/2022 1221   HGB 11.5 (L) 03/01/2014 0846   HCT 34.9 (L) 02/12/2022 1221   HCT 36.4 03/01/2014 0846   PLT 312.0 02/12/2022 1221   PLT 254 03/01/2014 0846   MCV 76.7 (L) 02/12/2022 1221   MCV 81 03/01/2014 0846   MCH 26.9 11/26/2021  1255   MCHC 31.1 02/12/2022 1221   RDW 18.0 (H) 02/12/2022 1221   RDW 15.6 (H) 03/01/2014 0846   LYMPHSABS 1.2 02/12/2022 1221   LYMPHSABS 1.0 03/01/2014 0846   MONOABS 0.4 02/12/2022 1221   MONOABS 0.3 03/01/2014 0846   EOSABS 0.1 02/12/2022 1221   EOSABS 0.1 03/01/2014 0846   BASOSABS 0.0 02/12/2022 1221   BASOSABS 0.0 03/01/2014 0846    No results found for: "POCLITH", "LITHIUM"   No results found for: "PHENYTOIN", "PHENOBARB", "VALPROATE", "CBMZ"   .res Assessment: Plan:    Pt seen for 30 minutes and time spent discussing response to change in medications. Suzanne Strong reports that Temazepam has been helpful for sleep initiation and maintenance and anxiety has improved with increase in Buspar. Will continue current plan of care since target signs and symptoms are well controlled without any tolerability issues. Continue Sertraline 200 mg po qd for anxiety and depression.  Continue Buspar 15 mg 1.5 tabs po BID for anxiety.  Continue  Alprazolam 0.5 mg po BID prn anxiety. Suzanne Strong reports that Suzanne Strong has not needed to use Alprazolam prn as often.  Continue Temazepam 15 mg po QHS prn insomnia.  Pt to follow-up in 6 months or sooner if clinically indicated.  Patient advised to contact office with any questions, adverse effects, or acute worsening in signs and symptoms.   Terryn was seen today for follow-up.  Diagnoses and all orders for this visit:  Anxiety state -     sertraline (ZOLOFT) 100 MG tablet; Take 2 tablets (200 mg total) by mouth every morning. -     busPIRone (BUSPAR) 15 MG tablet; Take 1.5 tabs po BID -     ALPRAZolam (XANAX) 0.5 MG tablet; Take 1 tablet (0.5 mg total) by mouth 2 (two) times daily as needed for anxiety.  Insomnia, unspecified type -     temazepam (RESTORIL) 15 MG capsule; Take 1 capsule (15 mg total) by mouth at bedtime as needed for sleep.     Please see After Visit Summary for patient specific instructions.  Future Appointments  Date Time Provider  [place]  05/26/2022  2:00 PM Armando Reichert, MD [facility] None  11/04/2022  9:30 AM Thayer Headings, PMHNP [facility] None  11/27/2022  1:30 PM [facility] None  11/27/2022  1:45 PM Cammie Sickle, MD [facility] None  11/27/2022  2:15 PM [facility] INFUSION CHAIR 1 [facility] None  12/01/2022  3:30 PM [facility] ANNUAL WELLNESS VISIT 1 [facility] PEC    No orders of the defined types were placed in this encounter.   -------------------------------

## 2022-05-13 ENCOUNTER — Other Ambulatory Visit: Payer: Self-pay | Admitting: Psychiatry

## 2022-05-13 DIAGNOSIS — F5101 Primary insomnia: Secondary | ICD-10-CM

## 2022-05-18 DIAGNOSIS — E119 Type 2 diabetes mellitus without complications: Secondary | ICD-10-CM | POA: Diagnosis not present

## 2022-05-18 LAB — HM DIABETES EYE EXAM

## 2022-05-19 ENCOUNTER — Encounter: Payer: Self-pay | Admitting: Family Medicine

## 2022-05-19 ENCOUNTER — Telehealth: Payer: Self-pay

## 2022-05-19 NOTE — Telephone Encounter (Signed)
Needs to be seen in light of fever Do a covid test at home if she has one Has h/o aspiration pneumonia   Please give ER precautions if she declines follow up  Check in on her tomorrow

## 2022-05-19 NOTE — Telephone Encounter (Signed)
Left VM requesting pt to call the office back 

## 2022-05-19 NOTE — Telephone Encounter (Signed)
Patient has been seen for cough by PCP and referral started for pulmonology she has appointment 4/9. She has had increased cough and some fever at night for 3 days at night. Temperature has been as high as 102 but goes down with otc meds. She does feel like she has increased SOB. Offered to look at other office to get seen today but patient declined. Wanted [place] to send message to Dr. Glori Bickers to see what she would like her to do.

## 2022-05-20 NOTE — Telephone Encounter (Signed)
See mychart message encounter

## 2022-05-20 NOTE — Telephone Encounter (Signed)
Suzanne Strong sent Suzanne Strong a Pharmacist, community message with Dr. Marliss Coots comments

## 2022-05-21 ENCOUNTER — Encounter: Payer: Self-pay | Admitting: *Deleted

## 2022-05-21 ENCOUNTER — Ambulatory Visit (INDEPENDENT_AMBULATORY_CARE_PROVIDER_SITE_OTHER): Payer: HMO | Admitting: Family Medicine

## 2022-05-21 ENCOUNTER — Encounter: Payer: Self-pay | Admitting: Family Medicine

## 2022-05-21 VITALS — BP 138/66 | HR 98 | Temp 97.9°F | Ht 66.0 in | Wt 216.7 lb

## 2022-05-21 DIAGNOSIS — R053 Chronic cough: Secondary | ICD-10-CM

## 2022-05-21 DIAGNOSIS — Z8701 Personal history of pneumonia (recurrent): Secondary | ICD-10-CM

## 2022-05-21 LAB — POCT INFLUENZA A/B
Influenza A, POC: NEGATIVE
Influenza B, POC: NEGATIVE

## 2022-05-21 MED ORDER — HYDROCOD POLI-CHLORPHE POLI ER 10-8 MG/5ML PO SUER: 5.0000 mL | Freq: Two times a day (BID) | ORAL | 0 refills | Status: DC | PRN

## 2022-05-21 MED ORDER — BENZONATATE 200 MG PO CAPS: 200.0000 mg | ORAL_CAPSULE | Freq: Three times a day (TID) | ORAL | 1 refills | Status: DC | PRN

## 2022-05-21 NOTE — Progress Notes (Signed)
Patient ID: Suzanne Strong, female    DOB: 10-Sep-1945, 77 y.o.   MRN: [id number]  This visit was conducted in person.  BP 138/66 (BP Location: Left Arm)   Pulse 98   Temp 97.9 F (36.6 C) (Temporal)   Ht 5\' 6"  (1.676 m)   Wt 216 lb 11.2 oz (98.3 kg)   SpO2 90%   BMI 34.98 kg/m    CC:  Chief Complaint  Patient presents with   Cough   Fever    Subjective:   HPI: Suzanne Strong is a 76 y.o. female patient of Tower, [name], MD With history of hypertension, obstructive sleep apnea, tonsillar cancer history, controlled diabetes presenting on 05/21/2022 for Cough and Fever   Date of onset:   months  Reviewed OV for same cough .. 04/06/2022 with PCP  CXR son 04/06/2022 showed:  baseline L lower scarring.  Slight temporary improvement with prednisone, albuterol, tussionax. and benzonatate.. but returned. Initial symptoms included  fever, body ache and chills Symptoms progressed to persistent  dry cough,  mild to moderate SOB.  She has trouble tolerating prednisone given she cannot sleep and states that she does not feel that this medicine helped with her issues significantly.  She does state albuterol seems to clear things up temporarily.   Sick contacts: none    Has referral in place to pulmonary 4/9  by PCP.   No history of chronic lung disease such as asthma or COPD. Has albuterol inhaler to use prn though for reactive symptoms. Former smoker.     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 Has history of past aspiration PNA and lung scarring. Lab Results  Component Value Date   HGBA1C 6.5 (H) 12/11/2020     Relevant past medical, surgical, family and social history reviewed and updated as indicated. Interim medical history since our last visit reviewed. Allergies and medications reviewed and updated. Outpatient Medications Prior to Visit  Medication Sig Dispense Refill   acetaminophen (TYLENOL) 325 MG tablet Take 2 tablets (650 mg total) by mouth every 6 (six)  hours as needed for mild pain (or Fever >/= 101).     albuterol (VENTOLIN HFA) 108 (90 Base) MCG/ACT inhaler Inhale 2 puffs into the lungs every 6 (six) hours as needed for wheezing or shortness of breath. 8 g 3   ALPRAZolam (XANAX) 0.5 MG tablet Take 1 tablet (0.5 mg total) by mouth 2 (two) times daily as needed for anxiety. 60 tablet 5   amitriptyline (ELAVIL) 50 MG tablet Take 1 tablet (50 mg total) by mouth at bedtime. 90 tablet 3   amLODipine (NORVASC) 5 MG tablet Take 1 tablet (5 mg total) by mouth daily. 90 tablet 3   atorvastatin (LIPITOR) 10 MG tablet TAKE ONE TABLET BY MOUTH ONCE DAILY 90 tablet 3   b complex vitamins tablet Take 1 tablet by mouth daily.     BIOTIN PO Take by mouth.     busPIRone (BUSPAR) 15 MG tablet Take 1.5 tabs po BID 270 tablet 1   Cholecalciferol (VITAMIN D-1000 MAX ST) 25 MCG (1000 UT) tablet Take 2,000 Units by mouth daily. Takes three 50 mcg tablets, 3 capsules daily     Emollient (COLLAGEN EX)      famotidine (PEPCID) 20 MG tablet Take 1 tablet (20 mg total) by mouth 2 (two) times daily. 60 tablet 5   ferrous sulfate 325 (65 FE) MG tablet Take 1 tablet (325 mg total) by mouth 2 (two) times  daily with a meal.     glipiZIDE (GLUCOTROL) 5 MG tablet Take 2 tablets (10 mg total) by mouth daily before breakfast.     glucose blood (ONETOUCH ULTRA) test strip USE TO check blood glucose ONCE DAILY AND AS NEEDED FOR DIABETES 100 strip 1   metFORMIN (GLUCOPHAGE) 500 MG tablet Take 2 tablets (1,000 mg total) by mouth 2 (two) times daily with a meal.     Multiple Vitamins-Minerals (WOMENS MULTIVITAMIN PO) Take by mouth.     omeprazole (PRILOSEC) 20 MG capsule TAKE ONE CAPSULE BY MOUTH EVERYDAY AT BEDTIME 90 capsule 3   ondansetron (ZOFRAN) 4 MG tablet Take 1 tablet (4 mg total) by mouth every 8 (eight) hours as needed for nausea or vomiting. 15 tablet 0   OZEMPIC, 0.25 OR 0.5 MG/DOSE, 2 MG/3ML SOPN Inject into the skin.     pregabalin (LYRICA) 150 MG capsule Take 300 mg by  mouth at bedtime.     rOPINIRole (REQUIP) 1 MG tablet TAKE 1/2 TABLET BY MOUTH EVERY MORNING and TAKE THREE TABLETS BY MOUTH EVERYDAY AT BEDTIME 315 tablet 3   sertraline (ZOLOFT) 100 MG tablet Take 2 tablets (200 mg total) by mouth every morning. 180 tablet 1   temazepam (RESTORIL) 15 MG capsule Take 1 capsule (15 mg total) by mouth at bedtime as needed for sleep. 30 capsule 5   zinc gluconate 50 MG tablet Take 50 mg by mouth daily.     benzonatate (TESSALON) 200 MG capsule Take 1 capsule (200 mg total) by mouth 3 (three) times daily as needed for cough. Swallow whole 30 capsule 1   cefdinir (OMNICEF) 300 MG capsule Take 1 capsule (300 mg total) by mouth 2 (two) times daily. 14 capsule 0   chlorpheniramine-HYDROcodone (TUSSIONEX) 10-8 MG/5ML Take 5 mLs by mouth every 12 (twelve) hours as needed for cough. Caution of sedation 50 mL 0   predniSONE (DELTASONE) 10 MG tablet Take 4 pills once daily by mouth for 3 days, then 3 pills daily for 3 days, then 2 pills daily for 3 days then 1 pill daily for 3 days then stop 30 tablet 0   Facility-Administered Medications Prior to Visit  Medication Dose Route Frequency Provider Last Rate Last Admin   influenza vaccine adjuvanted (FLUAD) injection 0.5 mL  0.5 mL Intramuscular Once Charlaine Dalton R, MD       sodium chloride flush (NS) 0.9 % injection 10 mL  10 mL Intravenous PRN Cammie Sickle, MD   10 mL at 09/11/15 0845     Per HPI unless specifically indicated in ROS section below Review of Systems  Constitutional:  Positive for fatigue. Negative for fever.  HENT:  Positive for congestion.   Eyes:  Negative for pain.  Respiratory:  Positive for cough and shortness of breath.   Cardiovascular:  Negative for chest pain, palpitations and leg swelling.  Gastrointestinal:  Negative for abdominal pain.  Genitourinary:  Negative for dysuria and vaginal bleeding.  Musculoskeletal:  Negative for back pain.  Neurological:  Negative for syncope,  light-headedness and headaches.  Psychiatric/Behavioral:  Negative for dysphoric mood.    Objective:  BP 138/66 (BP Location: Left Arm)   Pulse 98   Temp 97.9 F (36.6 C) (Temporal)   Ht 5\' 6"  (1.676 m)   Wt 216 lb 11.2 oz (98.3 kg)   SpO2 90%   BMI 34.98 kg/m   Wt Readings from Last 3 Encounters:  05/21/22 216 lb 11.2 oz (98.3 kg)  04/28/22 218  lb 2 oz (98.9 kg)  04/06/22 218 lb (98.9 kg)      Physical Exam Constitutional:      General: She is not in acute distress.    Appearance: Normal appearance. She is well-developed. She is not ill-appearing or toxic-appearing.  HENT:     Head: Normocephalic.     Right Ear: Hearing, tympanic membrane, ear canal and external ear normal. Tympanic membrane is not erythematous, retracted or bulging.     Left Ear: Hearing, tympanic membrane, ear canal and external ear normal. Tympanic membrane is not erythematous, retracted or bulging.     Nose: No mucosal edema or rhinorrhea.     Right Sinus: No maxillary sinus tenderness or frontal sinus tenderness.     Left Sinus: No maxillary sinus tenderness or frontal sinus tenderness.     Mouth/Throat:     Pharynx: Uvula midline.  Eyes:     General: Lids are normal. Lids are everted, no foreign bodies appreciated.     Conjunctiva/sclera: Conjunctivae normal.     Pupils: Pupils are equal, round, and reactive to light.  Neck:     Thyroid: No thyroid mass or thyromegaly.     Vascular: No carotid bruit.     Trachea: Trachea normal.  Cardiovascular:     Rate and Rhythm: Normal rate and regular rhythm.     Pulses: Normal pulses.     Heart sounds: Normal heart sounds, S1 normal and S2 normal. No murmur heard.    No friction rub. No gallop.  Pulmonary:     Effort: Pulmonary effort is normal. No tachypnea or respiratory distress.     Breath sounds: Examination of the right-upper field reveals rhonchi and rales. Examination of the left-upper field reveals rhonchi and rales. Examination of the right-middle  field reveals rhonchi and rales. Examination of the left-middle field reveals rhonchi and rales. Examination of the right-lower field reveals rhonchi and rales. Examination of the left-lower field reveals rhonchi and rales. Rhonchi and rales present. No decreased breath sounds or wheezing.  Abdominal:     General: Bowel sounds are normal.     Palpations: Abdomen is soft.     Tenderness: There is no abdominal tenderness.  Musculoskeletal:     Cervical back: Normal range of motion and neck supple.  Skin:    General: Skin is warm and dry.     Findings: No rash.  Neurological:     Mental Status: She is alert.  Psychiatric:        Mood and Affect: Mood is not anxious or depressed.        Speech: Speech normal.        Behavior: Behavior normal. Behavior is cooperative.        Thought Content: Thought content normal.        Judgment: Judgment normal.       Results for orders placed or performed in visit on 02/12/22  Comprehensive metabolic panel  Result Value Ref Range   Sodium 137 135 - 145 mEq/L   Potassium 3.7 3.5 - 5.1 mEq/L   Chloride 96 96 - 112 mEq/L   CO2 32 19 - 32 mEq/L   Glucose, Bld 161 (H) 70 - 99 mg/dL   BUN 20 6 - 23 mg/dL   Creatinine, Ser 1.15 0.40 - 1.20 mg/dL   Total Bilirubin 0.3 0.2 - 1.2 mg/dL   Alkaline Phosphatase 64 39 - 117 U/L   AST 16 0 - 37 U/L   ALT 11 0 - 35  U/L   Total Protein 6.9 6.0 - 8.3 g/dL   Albumin 3.8 3.5 - 5.2 g/dL   GFR 47.03 (L) >60.00 mL/min   Calcium 8.6 8.4 - 10.5 mg/dL  CBC with Differential/Platelet  Result Value Ref Range   WBC 6.2 4.0 - 10.5 K/uL   RBC 4.55 3.87 - 5.11 Mil/uL   Hemoglobin 10.9 (L) 12.0 - 15.0 g/dL   HCT 34.9 (L) 36.0 - 46.0 %   MCV 76.7 (L) 78.0 - 100.0 fl   MCHC 31.1 30.0 - 36.0 g/dL   RDW 18.0 (H) 11.5 - 15.5 %   Platelets 312.0 150.0 - 400.0 K/uL   Neutrophils Relative % 72.6 43.0 - 77.0 %   Lymphocytes Relative 19.2 12.0 - 46.0 %   Monocytes Relative 6.2 3.0 - 12.0 %   Eosinophils Relative 1.6 0.0 -  5.0 %   Basophils Relative 0.4 0.0 - 3.0 %   Neutro Abs 4.5 1.4 - 7.7 K/uL   Lymphs Abs 1.2 0.7 - 4.0 K/uL   Monocytes Absolute 0.4 0.1 - 1.0 K/uL   Eosinophils Absolute 0.1 0.0 - 0.7 K/uL   Basophils Absolute 0.0 0.0 - 0.1 K/uL  Iron  Result Value Ref Range   Iron 41 (L) 42 - 145 ug/dL    Assessment and Plan  Persistent cough for 3 weeks or longer Assessment & Plan: Now ongoing for 2 to 3 months. Chest x-ray February 19 unremarkable except for scarring in left lower lobe likely from her history of aspiration pneumonia in the setting of tonsillar cancer history. No current fever or acute infectious symptoms.  No hypoxia or clear indication for admission. Minimal improvement with prednisone and significant side effects so we will not repeat this at this time. We will evaluate further with chest CT. Continue to treat cough symptomatically. She will keep follow-up with pulmonary in 5 days.  Orders: -     CT CHEST WO CONTRAST; Future  History of aspiration pneumonia -     CT CHEST WO CONTRAST; Future  Other orders -     Benzonatate; Take 1 capsule (200 mg total) by mouth 3 (three) times daily as needed for cough. Swallow whole  Dispense: 30 capsule; Refill: 1 -     Hydrocod Poli-Chlorphe Poli ER; Take 5 mLs by mouth every 12 (twelve) hours as needed for cough. Caution of sedation  Dispense: 50 mL; Refill: 0   Return and ER precautions provided. Return if symptoms worsen or fail to improve.   Suzanne Lofts, MD

## 2022-05-21 NOTE — Addendum Note (Signed)
Addended by: Vaughan Browner on: 05/21/2022 09:37 AM   Modules accepted: Orders

## 2022-05-21 NOTE — Assessment & Plan Note (Signed)
Now ongoing for 2 to 3 months. Chest x-ray February 19 unremarkable except for scarring in left lower lobe likely from her history of aspiration pneumonia in the setting of tonsillar cancer history. No current fever or acute infectious symptoms.  No hypoxia or clear indication for admission. Minimal improvement with prednisone and significant side effects so we will not repeat this at this time. We will evaluate further with chest CT. Continue to treat cough symptomatically. She will keep follow-up with pulmonary in 5 days.

## 2022-05-22 ENCOUNTER — Ambulatory Visit
Admission: RE | Admit: 2022-05-22 | Discharge: 2022-05-22 | Disposition: A | Discharge status: Home / Self Care | Payer: HMO | Source: Ambulatory Visit | Attending: Family Medicine | Admitting: Family Medicine

## 2022-05-22 ENCOUNTER — Other Ambulatory Visit: Payer: Self-pay | Admitting: Family Medicine

## 2022-05-22 DIAGNOSIS — R0600 Dyspnea, unspecified: Secondary | ICD-10-CM | POA: Diagnosis not present

## 2022-05-22 DIAGNOSIS — R053 Chronic cough: Secondary | ICD-10-CM | POA: Diagnosis not present

## 2022-05-22 DIAGNOSIS — Z8701 Personal history of pneumonia (recurrent): Secondary | ICD-10-CM

## 2022-05-22 MED ORDER — DOXYCYCLINE HYCLATE 100 MG PO TABS
100.0000 mg | ORAL_TABLET | Freq: Two times a day (BID) | ORAL | 0 refills | Status: DC
Start: 2022-05-22 — End: 2022-05-26

## 2022-05-26 ENCOUNTER — Encounter: Payer: Self-pay | Admitting: Student in an Organized Health Care Education/Training Program

## 2022-05-26 ENCOUNTER — Telehealth: Payer: Self-pay

## 2022-05-26 ENCOUNTER — Ambulatory Visit (INDEPENDENT_AMBULATORY_CARE_PROVIDER_SITE_OTHER): Payer: HMO | Admitting: Student in an Organized Health Care Education/Training Program

## 2022-05-26 VITALS — BP 142/80 | HR 84 | Temp 97.8°F | Ht 66.0 in | Wt 218.8 lb

## 2022-05-26 DIAGNOSIS — J690 Pneumonitis due to inhalation of food and vomit: Secondary | ICD-10-CM | POA: Diagnosis not present

## 2022-05-26 DIAGNOSIS — R053 Chronic cough: Secondary | ICD-10-CM | POA: Diagnosis not present

## 2022-05-26 MED ORDER — SODIUM CHLORIDE 3 % IN NEBU
INHALATION_SOLUTION | RESPIRATORY_TRACT | 12 refills | Status: DC | PRN
Start: 2022-05-26 — End: 2022-07-31

## 2022-05-26 MED ORDER — ALBUTEROL SULFATE (2.5 MG/3ML) 0.083% IN NEBU: 2.5000 mg | INHALATION_SOLUTION | Freq: Two times a day (BID) | RESPIRATORY_TRACT | 11 refills | Status: AC

## 2022-05-26 NOTE — Telephone Encounter (Signed)
Per Dr. Aundria Rud verbally- patient will need flexible bronch with EBUS. Non urgent.  Spoke to [employer], she stated that she has staffing for 06/12/2022 at 3:00 following 12:00 case.  Spoke to Sidell with OR and was advised that time does not release until 06/01/2022. She recommended that I call back on 06/01/2022 to see if time is available. Will hold message to ensure f/u.

## 2022-05-26 NOTE — Progress Notes (Signed)
Synopsis: Referred in for cough by Tower, Suzanne Gallus, MD  Assessment & Plan:   1. Chronic cough 2. Aspiration pneumonia of both lower lobes, unspecified aspiration pneumonia type  Patient is presenting for the evaluation of recurrent aspiration pneumonias, with most recent Ct showing disease in the bilateral lower lobes. On my review, the chest CT is showing bilateral patchy consolidative disease, in addition to tree-in-bud opacities and bronchiectatic changes in the LLL. There is also mediastinal lymphadenopathy that appears to be similar to prior imaging in 2023.  I have reviewed the patient's chart, including her multiple visits with her primary care provider's office for cough/aspiration pneumonia. 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 The patient has known and documented aspiration of food content into her airway, which would explain her recurrent aspiration pneumonias. Furthermore, she has had documented discussions regarding having a G-tube placed for nutrition to decrease the risk of overt aspiration of food content (albeit would not eliminate silent aspiration risk). Patient refuses this. Finally, the patient is on multiple psychotropic and sedating medications, all of which greatly increase her risk of aspiration, especially given her altered swallowing at baseline.  It is my impression that the tree-in-bud opacities, consolidative opacities, bronchiectasis, and scarring are all due to recurrent aspiration pneumonias. That said, the differential for these findings includes infectious (aspiration pneumonia, non-tuberculous mycobateria, fungal infection), ABPA, follicular bronchiolitis, diffuse panbronchiolitis, sarcoidosis, and malignancy. I have discussed with the patient that while aspiration is the most likely etiology, we could offer bronchoscopy to rule out said  etiologies. I will proceed with scheduled flexible bronchoscopy for BAL, possible endobronchial & transbronchial biopsies as well as EBUS with TBNA to the mediastinal lymph nodes.  I will add a pulmonary toilet regimen to the patient's medication list. She will use nebulized hypertonic saline, followed by nebulized albuterol, followed by using a flutter device twice daily to aid in secretion clearance. I have stressed on her the importance of decreasing the number of sedating medicines. I will also reach out to the patient's primary care physician to coordinate care.  - AMB REFERRAL FOR DME - sodium chloride HYPERTONIC 3 % nebulizer solution; Take by nebulization as needed for other.  Dispense: 750 mL; Refill: 12 - albuterol (PROVENTIL) (2.5 MG/3ML) 0.083% nebulizer solution; Take 3 mLs (2.5 mg total) by nebulization in the morning and at bedtime.  Dispense: 180 mL; Refill: 11 -Flexible bronchoscopy, EBUS   Return in about 3 months (around 08/25/2022).  I spent 60 minutes caring for this patient today, including preparing to see the patient, obtaining a medical history , reviewing a separately obtained history, performing a medically appropriate examination and/or evaluation, counseling and educating the patient/family/caregiver, ordering medications, tests, or procedures, referring and communicating with other health care professionals (not separately reported), documenting clinical information in the electronic health record, independently interpreting results (not separately reported/billed) and communicating results to the patient/family/caregiver, and care coordination (not separately reported/billed)  [name], MD [facility] 05/26/2022 9:10 PM    End of visit medications:  Meds ordered this encounter  Medications   sodium chloride HYPERTONIC 3 % nebulizer solution    Sig: Take by nebulization as needed for other.    Dispense:  750 mL    Refill:  12   albuterol  (PROVENTIL) (2.5 MG/3ML) 0.083% nebulizer solution    Sig: Take 3 mLs (2.5 mg total) by nebulization in the morning  and at bedtime.    Dispense:  180 mL    Refill:  11     Current Outpatient Medications:    acetaminophen (TYLENOL) 325 MG tablet, Take 2 tablets (650 mg total) by mouth every 6 (six) hours as needed for mild pain (or Fever >/= 101)., Disp: , Rfl:    albuterol (PROVENTIL) (2.5 MG/3ML) 0.083% nebulizer solution, Take 3 mLs (2.5 mg total) by nebulization in the morning and at bedtime., Disp: 180 mL, Rfl: 11   albuterol (VENTOLIN HFA) 108 (90 Base) MCG/ACT inhaler, Inhale 2 puffs into the lungs every 6 (six) hours as needed for wheezing or shortness of breath., Disp: 8 g, Rfl: 3   ALPRAZolam (XANAX) 0.5 MG tablet, Take 1 tablet (0.5 mg total) by mouth 2 (two) times daily as needed for anxiety., Disp: 60 tablet, Rfl: 5   amitriptyline (ELAVIL) 50 MG tablet, Take 1 tablet (50 mg total) by mouth at bedtime., Disp: 90 tablet, Rfl: 3   amLODipine (NORVASC) 5 MG tablet, Take 1 tablet (5 mg total) by mouth daily., Disp: 90 tablet, Rfl: 3   atorvastatin (LIPITOR) 10 MG tablet, TAKE ONE TABLET BY MOUTH ONCE DAILY, Disp: 90 tablet, Rfl: 3   b complex vitamins tablet, Take 1 tablet by mouth daily., Disp: , Rfl:    benzonatate (TESSALON) 200 MG capsule, Take 1 capsule (200 mg total) by mouth 3 (three) times daily as needed for cough. Swallow whole, Disp: 30 capsule, Rfl: 1   BIOTIN PO, Take by mouth., Disp: , Rfl:    busPIRone (BUSPAR) 15 MG tablet, Take 1.5 tabs po BID, Disp: 270 tablet, Rfl: 1   chlorpheniramine-HYDROcodone (TUSSIONEX) 10-8 MG/5ML, Take 5 mLs by mouth every 12 (twelve) hours as needed for cough. Caution of sedation, Disp: 50 mL, Rfl: 0   Cholecalciferol (VITAMIN D-1000 MAX ST) 25 MCG (1000 UT) tablet, Take 2,000 Units by mouth daily. Takes three 50 mcg tablets, 3 capsules daily, Disp: , Rfl:    doxycycline (VIBRA-TABS) 100 MG tablet, Take 100 mg by mouth 2 (two) times daily.,  Disp: , Rfl:    Emollient (COLLAGEN EX), , Disp: , Rfl:    famotidine (PEPCID) 20 MG tablet, Take 1 tablet (20 mg total) by mouth 2 (two) times daily., Disp: 60 tablet, Rfl: 5   ferrous sulfate 325 (65 FE) MG tablet, Take 1 tablet (325 mg total) by mouth 2 (two) times daily with a meal., Disp: , Rfl:    glipiZIDE (GLUCOTROL) 5 MG tablet, Take 2 tablets (10 mg total) by mouth daily before breakfast., Disp: , Rfl:    glucose blood (ONETOUCH ULTRA) test strip, USE TO check blood glucose ONCE DAILY AND AS NEEDED FOR DIABETES, Disp: 100 strip, Rfl: 1   metFORMIN (GLUCOPHAGE) 500 MG tablet, Take 2 tablets (1,000 mg total) by mouth 2 (two) times daily with a meal., Disp: , Rfl:    Multiple Vitamins-Minerals (WOMENS MULTIVITAMIN PO), Take by mouth., Disp: , Rfl:    omeprazole (PRILOSEC) 20 MG capsule, TAKE ONE CAPSULE BY MOUTH EVERYDAY AT BEDTIME, Disp: 90 capsule, Rfl: 3   ondansetron (ZOFRAN) 4 MG tablet, Take 1 tablet (4 mg total) by mouth every 8 (eight) hours as needed for nausea or vomiting., Disp: 15 tablet, Rfl: 0   OZEMPIC, 0.25 OR 0.5 MG/DOSE, 2 MG/3ML SOPN, Inject into the skin., Disp: , Rfl:    pregabalin (LYRICA) 150 MG capsule, Take 300 mg by mouth at bedtime., Disp: , Rfl:    rOPINIRole (REQUIP) 1 MG  tablet, TAKE 1/2 TABLET BY MOUTH EVERY MORNING and TAKE THREE TABLETS BY MOUTH EVERYDAY AT BEDTIME, Disp: 315 tablet, Rfl: 3   sertraline (ZOLOFT) 100 MG tablet, Take 2 tablets (200 mg total) by mouth every morning., Disp: 180 tablet, Rfl: 1   sodium chloride HYPERTONIC 3 % nebulizer solution, Take by nebulization as needed for other., Disp: 750 mL, Rfl: 12   temazepam (RESTORIL) 15 MG capsule, Take 1 capsule (15 mg total) by mouth at bedtime as needed for sleep., Disp: 30 capsule, Rfl: 5   zinc gluconate 50 MG tablet, Take 50 mg by mouth daily., Disp: , Rfl:  No current facility-administered medications for this visit.  Facility-Administered Medications Ordered in Other Visits:    influenza  vaccine adjuvanted (FLUAD) injection 0.5 mL, 0.5 mL, Intramuscular, Once, Brahmanday, Govinda R, MD   sodium chloride flush (NS) 0.9 % injection 10 mL, 10 mL, Intravenous, PRN, Earna Coder, MD, 10 mL at 09/11/15 0845   Subjective:   PATIENT ID: Suzanne Strong GENDER: female DOB: [birth year], MRN: [id number]  Chief Complaint  Patient presents with   pulmonary consult    Dry cough, wheezing,SOB with exertion and occ fever at night x2y hx of PNA x4.    HPI  Patient is a 77 year old female with a past medical history of tonsilar cancer, s/p chemoradiation, as well as documented recurrent aspiration who presents to clinic for the evaluation of recurrent pneumonia.  Patient is reporting increased cough that has accelerated over the past few months. She is reporting recurrent pneumonia, with 4 episodes requiring treatment with antibiotics and steroids since christmas of 2023. On review of her chart, she has been seen multiple times at her PCP's office for recurrent pneumonia. The recurrent aspirations appear to be more chronic with multiple visits over the years to address this. When these episodes occur, she has fevers and chills, as well as a cough that is productive of sputum. Outside of said episodes, she always has a cough. She also reports choking on foods at times. Most of her aspiration pneumonias are treated on an ambulatory basis, thou she was admitted in April of 2022 for septic shock secondary to aspiration pneumonia. She has been evaluated multiple times by SLP, and multiple modified barium swallows have documented aspiration.  Patient has been followed closely by her primary care physician for persistent cough. During her last visit, a CT scan of the chest was ordered, showing airspace consolidation in the bilateral lower lobes. Patient was offered a G tube in the past that she declined.  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 She was also diagnosed with breast cancer (IIIa, triple negative) in January of 2017 s/p chemotherapy, lumpectomy, and sentinal lymph node biopsy, s/p radiation therapy. Patient tells me in clinic that her diagnosis of tonsillar cancer was in the early 90's, however. I have reviewed her medical record, and the earliest noted in the record document that the diagnosis was made in 2006. Patient is currently on multiple sedating medications, and polypharmacy was noted by her primary care physician as a problem. Patient reports she's been on these medications for a while.  Patient is a former smoker, having quit in the 80's. She used to work a Health and safety inspector job.  Chest CT 05/22/2022   IMPRESSION: 1. Patchy airspace consolidation within the posterior basal segment of the left lower  lobe, compatible with pneumonia and/or aspiration. 2. Persistent mildly enlarged mediastinal lymph nodes, likely reactive. 3. Mildly dilated central pulmonary vasculature, which can be seen in the setting of pulmonary arterial hypertension. 4. Aortic and coronary artery atherosclerosis (ICD10-I70.0).  Chest CT 07/22/2021  1. No evidence of pulmonary embolism. 2. Bilateral lower lung heterogeneous opacities suspicious for multifocal pneumonia superimposed on chronic scarring. Overall this is mildly improved compared to remote 05/28/2020 CT, however the associated lower lung opacities on today's radiographs are increased from recent 07/10/2021 radiographs suggesting a worsened acute process. 3. Likely reactive mediastinal lymphadenopathy.  Ancillary information including prior medications, full medical/surgical/family/social histories, and PFTs (when available) are listed below and have been reviewed.   Review of Systems  Constitutional:  Negative for chills and fever.  Respiratory:  Positive for cough,  sputum production, shortness of breath and wheezing.   Cardiovascular:  Negative for chest pain.  Skin:  Negative for rash.     Objective:   Vitals:   05/26/22 1402  BP: (!) 142/80  Pulse: 84  Temp: 97.8 F (36.6 C)  TempSrc: Temporal  SpO2: 93%  Weight: 218 lb 12.8 oz (99.2 kg)  Height: 5\' 6"  (1.676 m)   93% on RA BMI Readings from Last 3 Encounters:  05/26/22 35.32 kg/m  05/21/22 34.98 kg/m  04/28/22 35.21 kg/m   Wt Readings from Last 3 Encounters:  05/26/22 218 lb 12.8 oz (99.2 kg)  05/21/22 216 lb 11.2 oz (98.3 kg)  04/28/22 218 lb 2 oz (98.9 kg)    Physical Exam Constitutional:      General: She is not in acute distress.    Appearance: She is obese. She is not ill-appearing.  HENT:     Head: Normocephalic.  Cardiovascular:     Rate and Rhythm: Normal rate and regular rhythm.     Pulses: Normal pulses.     Heart sounds: Normal heart sounds.  Pulmonary:     Effort: Pulmonary effort is normal.     Breath sounds: Rhonchi and rales present. No wheezing.  Abdominal:     Palpations: Abdomen is soft.  Neurological:     General: No focal deficit present.     Mental Status: She is alert. Mental status is at baseline.       Ancillary Information    Past Medical History:  Diagnosis Date   Anemia    iron deficiency and B12 deficiency   Anxiety    Breast cancer 02/25/2015   upper-outer quadrant of left female breast, Triple Negative. Neo-adjuvant chemo with complete pathologic response.    Cervical dysplasia    conization   Diabetes mellitus without complication    Diverticulosis    Fever 04/11/2015   Gastroparesis    related to previous radiation tx   GERD (gastroesophageal reflux disease)    Hyperlipidemia    Hypertension    Neuropathy    legs/feet, S/P chemo meds   Personal history of chemotherapy    Personal history of radiation therapy 2017   LEFT lumpectomy w/ radiation   RLS (restless legs syndrome)    Shingles    Hx of   Tonsillar  cancer 2006   Wears dentures    partial bottom     Family History  Problem Relation Age of Onset   Osteoporosis Mother    Hyperlipidemia Mother    Depression Mother    Cancer Mother        skin cancer ? basal cell and lung ca heavy smoker   Alcohol  abuse Father    Cancer Father        skin CA ? basal cell   Heart disease Father        CHF   Hyperlipidemia Brother    Hypertension Brother    Breast cancer Neg Hx      Past Surgical History:  Procedure Laterality Date   APPENDECTOMY     BREAST BIOPSY Left 02/25/2015   INVASIVE MAMMARY CARCINOMA,Triple negative.   BREAST CYST ASPIRATION     BREAST EXCISIONAL BIOPSY Left 12/2015   surgery   BREAST LUMPECTOMY WITH NEEDLE LOCALIZATION Left 10/02/2015   Procedure: BREAST LUMPECTOMY WITH NEEDLE LOCALIZATION;  Surgeon: Earline Mayotte, MD;  Location: [facility];  Service: General;  Laterality: Left;   BREAST LUMPECTOMY WITH SENTINEL LYMPH NODE BIOPSY Left 10/02/2015   Procedure: LEFT BREAST WIDE EXCISION WITH SENTINEL LYMPH NODE BX;  Surgeon: Earline Mayotte, MD;  Location: [facility];  Service: General;  Laterality: Left;   BREAST SURGERY     breast biopsy benign   CARPAL TUNNEL RELEASE Right 05/19/2017   Procedure: CARPAL TUNNEL RELEASE;  Surgeon: Deeann Saint, MD;  Location: [facility];  Service: Orthopedics;  Laterality: Right;   CATARACT EXTRACTION W/PHACO Right 10/26/2018   Procedure: CATARACT EXTRACTION PHACO AND INTRAOCULAR LENS PLACEMENT (IOC) right diabetic;  Surgeon: Lockie Mola, MD;  Location: [facility];  Service: Ophthalmology;  Laterality: Right;  Diabetic - oral meds cancer center been notified and will come   CATARACT EXTRACTION W/PHACO Left 11/16/2018   Procedure: CATARACT EXTRACTION PHACO AND INTRAOCULAR LENS PLACEMENT (IOC) LEFT DIABETIC  01:01.0  17.0%  10.37;  Surgeon: Lockie Mola, MD;  Location: [facility];  Service: Ophthalmology;  Laterality: Left;  Diabetic - oral meds Port-a-cath    CHOLECYSTECTOMY     Cologuard  07/22/2016   Negative   COLONOSCOPY WITH PROPOFOL N/A 05/24/2020   Procedure: COLONOSCOPY WITH PROPOFOL;  Surgeon: Pasty Spillers, MD;  Location: [facility];  Service: Endoscopy;  Laterality: N/A;   ESOPHAGOGASTRODUODENOSCOPY  07/2009   normal with few gastric polyps   ESOPHAGOGASTRODUODENOSCOPY (EGD) WITH PROPOFOL N/A 05/24/2020   Procedure: ESOPHAGOGASTRODUODENOSCOPY (EGD) WITH PROPOFOL;  Surgeon: Pasty Spillers, MD;  Location: [facility];  Service: Endoscopy;  Laterality: N/A;   MOLE REMOVAL     PORT-A-CATH REMOVAL     PORTACATH PLACEMENT     PORTACATH PLACEMENT Right 03/12/2015   Procedure: INSERTION PORT-A-CATH;  Surgeon: Earline Mayotte, MD;  Location: [facility];  Service: General;  Laterality: Right;   RADICAL NECK DISSECTION     TONSILLECTOMY     cancer treated with chemo and radiation   TRIGGER FINGER RELEASE Right 05/19/2017   Procedure: RELEASE TRIGGER FINGER/A-1 PULLEY ring finger;  Surgeon: Deeann Saint, MD;  Location: [facility];  Service: Orthopedics;  Laterality: Right;    Social History   Socioeconomic History   Marital status: Widowed    Spouse name: Not on file   Number of children: 0   Years of education: 14   Highest education level: Not on file  Occupational History   Occupation: book Biomedical engineer  Tobacco Use   Smoking status: Former    Packs/day: .5    Types: Cigarettes    Quit date: 1985    Years since quitting: 39.2   Smokeless tobacco: Never  Vaping Use   Vaping Use: Never used  Substance and Sexual Activity   Alcohol use: No    Alcohol/week: 0.0 standard drinks of alcohol   Drug  use: No   Sexual activity: Not Currently  Other Topics Concern   Not on file  Social History Narrative   Lives with husband in a one story home.  No children.        Works as Catering manager for [employer].        Education: college.    Social Determinants of Health   Financial Resource Strain: Low Risk  (11/27/2021)    Overall Financial Resource Strain (CARDIA)    Difficulty of Paying Living Expenses: Not hard at all  Food Insecurity: No Food Insecurity (11/27/2021)   Hunger Vital Sign    Worried About Running Out of Food in the Last Year: Never true    Ran Out of Food in the Last Year: Never true  Transportation Needs: No Transportation Needs (11/27/2021)   PRAPARE - Administrator, Civil Service (Medical): No    Lack of Transportation (Non-Medical): No  Physical Activity: Insufficiently Active (11/27/2021)   Exercise Vital Sign    Days of Exercise per Week: 3 days    Minutes of Exercise per Session: 30 min  Stress: No Stress Concern Present (11/27/2021)   [employer] of Occupational Health - Occupational Stress Questionnaire    Feeling of Stress : Not at all  Social Connections: Moderately Integrated (11/27/2021)   Social Connection and Isolation Panel [NHANES]    Frequency of Communication with Friends and Family: More than three times a week    Frequency of Social Gatherings with Friends and Family: More than three times a week    Attends Religious Services: More than 4 times per year    Active Member of [employer] or Organizations: Yes    Attends Banker Meetings: More than 4 times per year    Marital Status: Widowed  Intimate Partner Violence: Not At Risk (11/27/2021)   Humiliation, Afraid, Rape, and Kick questionnaire    Fear of Current or Ex-Partner: No    Emotionally Abused: No    Physically Abused: No    Sexually Abused: No     Allergies  Allergen Reactions   Amoxicillin Swelling    REACTION: rash, swelling Has patient had a PCN reaction causing immediate rash, facial/tongue/throat swelling, SOB or lightheadedness with hypotension: yes Has patient had a PCN reaction causing severe rash involving mucus membranes or skin necrosis: no Has patient had a PCN reaction that required hospitalization: no Has patient had a PCN reaction occurring within the last 10  years: yes If all of the above answers are "NO", then may proceed with Cephalosporin use.  Has tolerated ceftriaxone   Penicillin G Anaphylaxis    Has tolerated ceftriaxone on multiple occasions    Fluconazole Rash    REACTION: hives   Difuleron [Ferrous Fumarate-Dss]    Other Other (See Comments)    Pt has had tonsil cancer (on Left side) - pt may have difficulty swallowing     CBC    Component Value Date/Time   WBC 6.2 02/12/2022 1221   RBC 4.55 02/12/2022 1221   HGB 10.9 (L) 02/12/2022 1221   HGB 11.5 (L) 03/01/2014 0846   HCT 34.9 (L) 02/12/2022 1221   HCT 36.4 03/01/2014 0846   PLT 312.0 02/12/2022 1221   PLT 254 03/01/2014 0846   MCV 76.7 (L) 02/12/2022 1221   MCV 81 03/01/2014 0846   MCH 26.9 11/26/2021 1255   MCHC 31.1 02/12/2022 1221   RDW 18.0 (H) 02/12/2022 1221   RDW 15.6 (H) 03/01/2014 1610  LYMPHSABS 1.2 02/12/2022 1221   LYMPHSABS 1.0 03/01/2014 0846   MONOABS 0.4 02/12/2022 1221   MONOABS 0.3 03/01/2014 0846   EOSABS 0.1 02/12/2022 1221   EOSABS 0.1 03/01/2014 0846   BASOSABS 0.0 02/12/2022 1221   BASOSABS 0.0 03/01/2014 0846    Pulmonary Functions Testing Results:     No data to display          Outpatient Medications Prior to Visit  Medication Sig Dispense Refill   acetaminophen (TYLENOL) 325 MG tablet Take 2 tablets (650 mg total) by mouth every 6 (six) hours as needed for mild pain (or Fever >/= 101).     albuterol (VENTOLIN HFA) 108 (90 Base) MCG/ACT inhaler Inhale 2 puffs into the lungs every 6 (six) hours as needed for wheezing or shortness of breath. 8 g 3   ALPRAZolam (XANAX) 0.5 MG tablet Take 1 tablet (0.5 mg total) by mouth 2 (two) times daily as needed for anxiety. 60 tablet 5   amitriptyline (ELAVIL) 50 MG tablet Take 1 tablet (50 mg total) by mouth at bedtime. 90 tablet 3   amLODipine (NORVASC) 5 MG tablet Take 1 tablet (5 mg total) by mouth daily. 90 tablet 3   atorvastatin (LIPITOR) 10 MG tablet TAKE ONE TABLET BY MOUTH ONCE  DAILY 90 tablet 3   b complex vitamins tablet Take 1 tablet by mouth daily.     benzonatate (TESSALON) 200 MG capsule Take 1 capsule (200 mg total) by mouth 3 (three) times daily as needed for cough. Swallow whole 30 capsule 1   BIOTIN PO Take by mouth.     busPIRone (BUSPAR) 15 MG tablet Take 1.5 tabs po BID 270 tablet 1   chlorpheniramine-HYDROcodone (TUSSIONEX) 10-8 MG/5ML Take 5 mLs by mouth every 12 (twelve) hours as needed for cough. Caution of sedation 50 mL 0   Cholecalciferol (VITAMIN D-1000 MAX ST) 25 MCG (1000 UT) tablet Take 2,000 Units by mouth daily. Takes three 50 mcg tablets, 3 capsules daily     doxycycline (VIBRA-TABS) 100 MG tablet Take 100 mg by mouth 2 (two) times daily.     Emollient (COLLAGEN EX)      famotidine (PEPCID) 20 MG tablet Take 1 tablet (20 mg total) by mouth 2 (two) times daily. 60 tablet 5   ferrous sulfate 325 (65 FE) MG tablet Take 1 tablet (325 mg total) by mouth 2 (two) times daily with a meal.     glipiZIDE (GLUCOTROL) 5 MG tablet Take 2 tablets (10 mg total) by mouth daily before breakfast.     glucose blood (ONETOUCH ULTRA) test strip USE TO check blood glucose ONCE DAILY AND AS NEEDED FOR DIABETES 100 strip 1   metFORMIN (GLUCOPHAGE) 500 MG tablet Take 2 tablets (1,000 mg total) by mouth 2 (two) times daily with a meal.     Multiple Vitamins-Minerals (WOMENS MULTIVITAMIN PO) Take by mouth.     omeprazole (PRILOSEC) 20 MG capsule TAKE ONE CAPSULE BY MOUTH EVERYDAY AT BEDTIME 90 capsule 3   ondansetron (ZOFRAN) 4 MG tablet Take 1 tablet (4 mg total) by mouth every 8 (eight) hours as needed for nausea or vomiting. 15 tablet 0   OZEMPIC, 0.25 OR 0.5 MG/DOSE, 2 MG/3ML SOPN Inject into the skin.     pregabalin (LYRICA) 150 MG capsule Take 300 mg by mouth at bedtime.     rOPINIRole (REQUIP) 1 MG tablet TAKE 1/2 TABLET BY MOUTH EVERY MORNING and TAKE THREE TABLETS BY MOUTH EVERYDAY AT BEDTIME 315 tablet 3  sertraline (ZOLOFT) 100 MG tablet Take 2 tablets (200  mg total) by mouth every morning. 180 tablet 1   temazepam (RESTORIL) 15 MG capsule Take 1 capsule (15 mg total) by mouth at bedtime as needed for sleep. 30 capsule 5   zinc gluconate 50 MG tablet Take 50 mg by mouth daily.     doxycycline (VIBRA-TABS) 100 MG tablet Take 1 tablet (100 mg total) by mouth 2 (two) times daily. 20 tablet 0   Facility-Administered Medications Prior to Visit  Medication Dose Route Frequency Provider Last Rate Last Admin   influenza vaccine adjuvanted (FLUAD) injection 0.5 mL  0.5 mL Intramuscular Once Louretta Shorten R, MD       sodium chloride flush (NS) 0.9 % injection 10 mL  10 mL Intravenous PRN Earna Coder, MD   10 mL at 09/11/15 0845

## 2022-05-27 DIAGNOSIS — E119 Type 2 diabetes mellitus without complications: Secondary | ICD-10-CM | POA: Diagnosis not present

## 2022-05-27 DIAGNOSIS — M25562 Pain in left knee: Secondary | ICD-10-CM | POA: Diagnosis not present

## 2022-05-27 DIAGNOSIS — M13862 Other specified arthritis, left knee: Secondary | ICD-10-CM | POA: Diagnosis not present

## 2022-05-29 ENCOUNTER — Ambulatory Visit: Payer: PPO | Admitting: Internal Medicine

## 2022-05-29 ENCOUNTER — Ambulatory Visit: Payer: PPO

## 2022-05-29 ENCOUNTER — Other Ambulatory Visit: Payer: PPO

## 2022-06-02 ENCOUNTER — Telehealth: Payer: Self-pay

## 2022-06-02 NOTE — Telephone Encounter (Signed)
Patient is aware of date/time of bronch. She is aware that I will call back with pred admit and covid test appt. She voiced her understanding.

## 2022-06-02 NOTE — Telephone Encounter (Signed)
For codes 16109, [id number], [phone] Prior Auth Not Required Refer # [id number]

## 2022-06-02 NOTE — Telephone Encounter (Signed)
Bronch with EBUS scheduled 06/12/2022 at 3:00.  MGQ:[id number], [id number], [id number] IZ:[id number]   Synetta Fail, please see bronch info. Thanks

## 2022-06-02 NOTE — Telephone Encounter (Signed)
Case rescheduled to 06/24/2022 at 12:30. Will contact patient once covid and pre admit has been rescheduled.

## 2022-06-02 NOTE — Telephone Encounter (Signed)
Received epic secure chat from Dr. Aundria Rud and Dr. Vilma Prader- bronch for 06/12/2022 will need to be rescheduled due to cutoff for last courier pickup to go to the lab for processing. Case has been canceled. Patient is aware.  Will work with Dr. Aundria Rud, Glendale Chard and OR on rescheduling.

## 2022-06-02 NOTE — Telephone Encounter (Signed)
Phone pre admit test 06/05/2022 between 8-1 and covid test 06/10/2022 between 8-12. Patient is aware of above dates/times and voiced her understanding.  Nothing further needed.

## 2022-06-03 NOTE — Telephone Encounter (Addendum)
Pre admit 06/16/2022 between 8-12 and covid 06/22/2022 between 8-12. Patient is aware of dates/times and voiced her understanding.  Nothing further needed.

## 2022-06-04 DIAGNOSIS — E1140 Type 2 diabetes mellitus with diabetic neuropathy, unspecified: Secondary | ICD-10-CM | POA: Diagnosis not present

## 2022-06-04 LAB — MICROALBUMIN / CREATININE URINE RATIO: Microalb Creat Ratio: 5.7

## 2022-06-04 LAB — PROTEIN / CREATININE RATIO, URINE
Albumin, U: 11
Creatinine, Urine: 193.4

## 2022-06-05 ENCOUNTER — Other Ambulatory Visit: Payer: HMO

## 2022-06-08 ENCOUNTER — Telehealth: Payer: Self-pay | Admitting: Family Medicine

## 2022-06-08 NOTE — Telephone Encounter (Signed)
If wound is red/infected looing or painful needs to be seen  If not - can come in for Td  Thanks for letting me know

## 2022-06-08 NOTE — Telephone Encounter (Signed)
Pt called asking to speak to a nurse. Pt states on Sat, 4/20, she fell & a rusted piece of a fence got stuck in her leg. Pt is asking if she is up to date on her tetanus shot? Call back # [phone]

## 2022-06-08 NOTE — Telephone Encounter (Signed)
I called patient,she said that the puncture wound doesn't look infected.She said that its just a little sore,but nothing major. She said that its just a little wound on her leg.I scheduled her for her tetanus shot with the nurse for tomorrow.

## 2022-06-08 NOTE — Telephone Encounter (Signed)
See Dr. Royden Purl note. Please schedule according to what pt says.

## 2022-06-08 NOTE — Telephone Encounter (Signed)
Pt is due for Td shot, Dr. Milinda Antis do you want her scheduled for eval with a provider or nurse visit just for Td shot

## 2022-06-09 ENCOUNTER — Ambulatory Visit (INDEPENDENT_AMBULATORY_CARE_PROVIDER_SITE_OTHER): Payer: HMO

## 2022-06-09 DIAGNOSIS — N1831 Chronic kidney disease, stage 3a: Secondary | ICD-10-CM | POA: Diagnosis not present

## 2022-06-09 DIAGNOSIS — T148XXA Other injury of unspecified body region, initial encounter: Secondary | ICD-10-CM | POA: Diagnosis not present

## 2022-06-09 DIAGNOSIS — I700 Atherosclerosis of aorta: Secondary | ICD-10-CM | POA: Diagnosis not present

## 2022-06-09 DIAGNOSIS — E1169 Type 2 diabetes mellitus with other specified complication: Secondary | ICD-10-CM | POA: Diagnosis not present

## 2022-06-09 DIAGNOSIS — E785 Hyperlipidemia, unspecified: Secondary | ICD-10-CM | POA: Diagnosis not present

## 2022-06-09 DIAGNOSIS — Z23 Encounter for immunization: Secondary | ICD-10-CM

## 2022-06-09 DIAGNOSIS — G620 Drug-induced polyneuropathy: Secondary | ICD-10-CM | POA: Diagnosis not present

## 2022-06-09 DIAGNOSIS — E1159 Type 2 diabetes mellitus with other circulatory complications: Secondary | ICD-10-CM | POA: Diagnosis not present

## 2022-06-09 DIAGNOSIS — E1122 Type 2 diabetes mellitus with diabetic chronic kidney disease: Secondary | ICD-10-CM | POA: Diagnosis not present

## 2022-06-09 NOTE — Progress Notes (Signed)
Per orders of Dr. Eustaquio Boyden, injection of Td given by Lonia Blood in left deltoid. Patient tolerated injection well.

## 2022-06-10 ENCOUNTER — Other Ambulatory Visit: Payer: HMO

## 2022-06-12 ENCOUNTER — Ambulatory Visit: Admit: 2022-06-12 | Payer: HMO | Admitting: Student in an Organized Health Care Education/Training Program

## 2022-06-12 SURGERY — BRONCHOSCOPY, FLEXIBLE
Anesthesia: General | Laterality: Bilateral

## 2022-06-16 ENCOUNTER — Other Ambulatory Visit: Payer: Self-pay

## 2022-06-16 ENCOUNTER — Encounter
Admission: RE | Admit: 2022-06-16 | Discharge: 2022-06-16 | Disposition: A | Discharge status: Home / Self Care | Payer: HMO | Source: Ambulatory Visit | Attending: Student in an Organized Health Care Education/Training Program | Admitting: Student in an Organized Health Care Education/Training Program

## 2022-06-16 DIAGNOSIS — E119 Type 2 diabetes mellitus without complications: Secondary | ICD-10-CM

## 2022-06-16 DIAGNOSIS — I10 Essential (primary) hypertension: Secondary | ICD-10-CM

## 2022-06-16 DIAGNOSIS — Z01812 Encounter for preprocedural laboratory examination: Secondary | ICD-10-CM

## 2022-06-16 HISTORY — DX: Dyspnea, unspecified: R06.00

## 2022-06-16 HISTORY — DX: Pneumonia, unspecified organism: J18.9

## 2022-06-16 HISTORY — DX: Sleep apnea, unspecified: G47.30

## 2022-06-16 HISTORY — DX: Myoneural disorder, unspecified: G70.9

## 2022-06-16 NOTE — Patient Instructions (Addendum)
Your procedure is scheduled on: 06/24/22 - Wednesday Report to the Registration Desk on the 1st floor of the [facility]. To find out your arrival time, please call [phone] between 1PM - 3PM on: 06/23/22 - Tuesday If your arrival time is 6:00 am, do not arrive before that time as the [facility] entrance doors do not open until 6:00 am.  REMEMBER: Instructions that are not followed completely may result in serious medical risk, up to and including death; or upon the discretion of your surgeon and anesthesiologist your surgery may need to be rescheduled.  Do not eat food or drink any liquids after midnight the night before surgery.  No gum chewing or hard candies.   One week prior to surgery: Stop taking beginning 06/17/22, Anti-inflammatories (NSAIDS) such as Advil, Aleve, Ibuprofen, Motrin, Naproxen, Naprosyn and Aspirin based products such as Excedrin, Goody's Powder, BC Powder.  Stop beginning 06/17/22 ANY OVER THE COUNTER supplements until after surgery.  You may take Tylenol if needed for pain up until the day of surgery.  Continue taking all prescribed medications with the exception of the following:  metFORMIN (GLUCOPHAGE stop taking beginning 06/22/22.   TAKE ONLY THESE MEDICATIONS THE MORNING OF SURGERY WITH A SIP OF WATER:  famotidine (PEPCID)  albuterol (PROVENTIL) (2.5 MG/3ML) 0.083% nebulizer solution  amLODipine (NORVASC)  busPIRone (BUSPAR)  rOPINIRole (REQUIP)  sertraline (ZOLOFT)   Use inhalers on the day of surgery and bring to the hospital.   No Alcohol for 24 hours before or after surgery.  No Smoking including e-cigarettes for 24 hours before surgery.  No chewable tobacco products for at least 6 hours before surgery.  No nicotine patches on the day of surgery.  Do not use any "recreational" drugs for at least a week (preferably 2 weeks) before your surgery.  Please be advised that the combination of cocaine and anesthesia may have negative  outcomes, up to and including death. If you test positive for cocaine, your surgery will be cancelled.  On the morning of surgery brush your teeth with toothpaste and water, you may rinse your mouth with mouthwash if you wish. Do not swallow any toothpaste or mouthwash.  Do not wear jewelry, make-up, hairpins, clips or nail polish.  Do not wear lotions, powders, or perfumes.   Do not shave body hair from the neck down 48 hours before surgery.  Contact lenses, hearing aids and dentures may not be worn into surgery.  Do not bring valuables to the hospital. [facility] is not responsible for any missing/lost belongings or valuables.   Notify your doctor if there is any change in your medical condition (cold, fever, infection).  Wear comfortable clothing (specific to your surgery type) to the hospital.  After surgery, you can help prevent lung complications by doing breathing exercises.  Take deep breaths and cough every 1-2 hours. Your doctor may order a device called an Incentive Spirometer to help you take deep breaths. When coughing or sneezing, hold a pillow firmly against your incision with both hands. This is called "splinting." Doing this helps protect your incision. It also decreases belly discomfort.  If you are being admitted to the hospital overnight, leave your suitcase in the car. After surgery it may be brought to your room.  In case of increased patient census, it may be necessary for you, the patient, to continue your postoperative care in the Same Day Surgery department.  If you are being discharged the day of surgery, you will not be  allowed to drive home. You will need a responsible individual to drive you home and stay with you for 24 hours after surgery.   If you are taking public transportation, you will need to have a responsible individual with you.  Please call the Pre-admissions Testing Dept. at [phone] if you have any questions about these  instructions.  Surgery Visitation Policy:  Patients having surgery or a procedure may have two visitors.  Children under the age of 5 must have an adult with them who is not the patient.  Inpatient Visitation:    Visiting hours are 7 a.m. to 8 p.m. Up to four visitors are allowed at one time in a patient room. The visitors may rotate out with other people during the day.  One visitor age 44 or older may stay with the patient overnight and must be in the room by 8 p.m.

## 2022-06-16 NOTE — Progress Notes (Signed)
  Perioperative Services Pre-Admission/Anesthesia Testing    Date: 06/16/22  Name: Suzanne Strong MRN:   [id number]  Re: GLP-1 clearance and provider recommendations   Planned Surgical Procedure(s):    Case: [id number] Date/Time: 06/24/22 1230   Procedures:      FLEXIBLE BRONCHOSCOPY (Bilateral)     VIDEO BRONCHOSCOPY WITH ENDOBRONCHIAL ULTRASOUND (Bilateral)   Anesthesia type: General   Pre-op diagnosis: Aspitation   Location: [facility] PROCEDURE RM 02 / [facility] ORS FOR ANESTHESIA GROUP   Surgeons: [name], MD      Clinical Notes:  Patient is scheduled for the above procedure with the indicated provider/surgeon. In review of her medication reconciliation it was noted that patient is on a prescribed GLP-1 medication. Per guidelines issued by the American Society of Anesthesiologists (ASA), it is recommended that these medications be held for 7 days prior to the patient undergoing any type of elective surgical procedure. The patient is taking the following GLP-1 medication:  [x]  SEMAGLUTIDE   []  EXENATIDE  []  LIRAGLUTIDE   []  LIXISENATIDE  []  DULAGLUTIDE     []  OTHER GLP-1 medication: _______________  Reached out to prescribing provider Tedd Sias, MD) to make them aware of the guidelines from anesthesia. Given that this patient takes the prescribed GLP-1 medication for her  diabetes diagnosis, rather than for weight loss, recommendations from the prescribing provider were solicited. Prescribing provider made aware of the following so that informed decision/POC can be developed for this patient that may be taking medications belonging to these drug classes:  Oral GLP-1 medications will be held 1 day prior to surgery.  Injectable GLP-1 medications will be held 7 days prior to surgery.  Metformin is routinely held 48 hours prior to surgery due to renal concerns, potential need for contrasted imaging perioperatively, and the potential for tissue hypoxia leading to drug induced lactic  acidosis.  All SGLT2i medications are held 72 hours prior to surgery as they can be associated with the increased potential for developing euglycemic diabetic ketoacidosis (EDKA).   Impression and Plan:  Suzanne Strong is on a prescribed GLP-1 medication, which induces the known side effect of decreased gastric emptying. Efforts are bring made to mitigate the risk of perioperative hyperglycemic events, as elevated blood glucose levels have been found to contribute to intra/postoperative complications. Additionally, hyperglycemic extremes can potentially necessitate the postponing of a patient's elective case in order to better optimize perioperative glycemic control, again with the aforementioned guidelines in place. With this in mind, recommendations have been sought from the prescribing provider, who has cleared patient to proceed with holding the prescribed GLP-1 as per the guidelines from the ASA.   Provider recommending: no further recommendations received from the prescribing provider.  Copy of signed clearance and recommendations placed on patient's chart for inclusion in their medical record and for review by the surgical/anesthetic team on the day of her procedure.   Quentin Mulling, MSN, APRN, FNP-C, CEN [facility]  Peri-operative Services Nurse Practitioner Phone: [phone] 06/16/22 5:13 PM  NOTE: This note has been prepared using Dragon dictation software. Despite my best ability to proofread, there is always the potential that unintentional transcriptional errors may still occur from this process.

## 2022-06-17 ENCOUNTER — Encounter
Admission: RE | Admit: 2022-06-17 | Discharge: 2022-06-17 | Disposition: A | Discharge status: Home / Self Care | Payer: HMO | Source: Ambulatory Visit | Attending: Student in an Organized Health Care Education/Training Program | Admitting: Student in an Organized Health Care Education/Training Program

## 2022-06-17 DIAGNOSIS — Z01818 Encounter for other preprocedural examination: Secondary | ICD-10-CM | POA: Insufficient documentation

## 2022-06-17 DIAGNOSIS — E119 Type 2 diabetes mellitus without complications: Secondary | ICD-10-CM | POA: Diagnosis not present

## 2022-06-17 DIAGNOSIS — Z01812 Encounter for preprocedural laboratory examination: Secondary | ICD-10-CM

## 2022-06-17 DIAGNOSIS — I10 Essential (primary) hypertension: Secondary | ICD-10-CM | POA: Diagnosis not present

## 2022-06-17 LAB — CBC
HCT: 32.6 % — ABNORMAL LOW (ref 36.0–46.0)
Hemoglobin: 9.4 g/dL — ABNORMAL LOW (ref 12.0–15.0)
MCH: 21.7 pg — ABNORMAL LOW (ref 26.0–34.0)
MCHC: 28.8 g/dL — ABNORMAL LOW (ref 30.0–36.0)
MCV: 75.1 fL — ABNORMAL LOW (ref 80.0–100.0)
Platelets: 348 10*3/uL (ref 150–400)
RBC: 4.34 MIL/uL (ref 3.87–5.11)
RDW: 17.7 % — ABNORMAL HIGH (ref 11.5–15.5)
WBC: 7.4 10*3/uL (ref 4.0–10.5)
nRBC: 0 % (ref 0.0–0.2)

## 2022-06-18 ENCOUNTER — Telehealth: Payer: Self-pay

## 2022-06-18 NOTE — Progress Notes (Signed)
Care Management & Coordination Services Pharmacy Team  Reason for Encounter: Medication adherence  Patient is NOT more than 5 days past due for refill on the following medications per chart history:  Drug Name / Strength / Sig Atorvastatin 10mg   TAKE ONE TABLET BY MOUTH ONCE DAILY   When was the prescription last filled?  06/08/22  [facility]ds  Al Corpus, PharmD notified  Burt Knack, [facility] Clinical Pharmacy Assistant [phone]

## 2022-06-22 ENCOUNTER — Inpatient Hospital Stay: Admission: RE | Admit: 2022-06-22 | Payer: HMO | Source: Ambulatory Visit

## 2022-06-23 ENCOUNTER — Encounter
Admission: RE | Admit: 2022-06-23 | Discharge: 2022-06-23 | Disposition: A | Discharge status: Home / Self Care | Payer: HMO | Source: Ambulatory Visit | Attending: Student in an Organized Health Care Education/Training Program | Admitting: Student in an Organized Health Care Education/Training Program

## 2022-06-23 DIAGNOSIS — Z1152 Encounter for screening for COVID-19: Secondary | ICD-10-CM

## 2022-06-23 LAB — SARS CORONAVIRUS 2 (TAT 6-24 HRS): SARS Coronavirus 2: NEGATIVE

## 2022-06-24 ENCOUNTER — Ambulatory Visit: Payer: HMO | Admitting: Urgent Care

## 2022-06-24 ENCOUNTER — Encounter
Admission: RE | Disposition: A | Discharge status: Home / Self Care | Payer: Self-pay | Source: Home / Self Care | Attending: Student in an Organized Health Care Education/Training Program

## 2022-06-24 ENCOUNTER — Other Ambulatory Visit: Payer: Self-pay

## 2022-06-24 ENCOUNTER — Ambulatory Visit
Admission: RE | Admit: 2022-06-24 | Discharge: 2022-06-24 | Disposition: A | Discharge status: Home / Self Care | Payer: HMO | Attending: Student in an Organized Health Care Education/Training Program | Admitting: Student in an Organized Health Care Education/Training Program

## 2022-06-24 ENCOUNTER — Encounter: Payer: Self-pay | Admitting: Student in an Organized Health Care Education/Training Program

## 2022-06-24 DIAGNOSIS — Z87891 Personal history of nicotine dependence: Secondary | ICD-10-CM | POA: Diagnosis not present

## 2022-06-24 DIAGNOSIS — R590 Localized enlarged lymph nodes: Secondary | ICD-10-CM | POA: Diagnosis not present

## 2022-06-24 DIAGNOSIS — Z8701 Personal history of pneumonia (recurrent): Secondary | ICD-10-CM | POA: Diagnosis not present

## 2022-06-24 DIAGNOSIS — Z9221 Personal history of antineoplastic chemotherapy: Secondary | ICD-10-CM | POA: Diagnosis not present

## 2022-06-24 DIAGNOSIS — J690 Pneumonitis due to inhalation of food and vomit: Secondary | ICD-10-CM | POA: Diagnosis not present

## 2022-06-24 DIAGNOSIS — R053 Chronic cough: Secondary | ICD-10-CM | POA: Diagnosis not present

## 2022-06-24 DIAGNOSIS — Z01812 Encounter for preprocedural laboratory examination: Secondary | ICD-10-CM

## 2022-06-24 DIAGNOSIS — R591 Generalized enlarged lymph nodes: Secondary | ICD-10-CM

## 2022-06-24 DIAGNOSIS — Z923 Personal history of irradiation: Secondary | ICD-10-CM | POA: Diagnosis not present

## 2022-06-24 DIAGNOSIS — Z85818 Personal history of malignant neoplasm of other sites of lip, oral cavity, and pharynx: Secondary | ICD-10-CM | POA: Insufficient documentation

## 2022-06-24 DIAGNOSIS — E119 Type 2 diabetes mellitus without complications: Secondary | ICD-10-CM

## 2022-06-24 HISTORY — PX: FLEXIBLE BRONCHOSCOPY: SHX5094

## 2022-06-24 HISTORY — PX: VIDEO BRONCHOSCOPY WITH ENDOBRONCHIAL ULTRASOUND: SHX6177

## 2022-06-24 LAB — CULTURE, RESPIRATORY W GRAM STAIN

## 2022-06-24 LAB — GLUCOSE, CAPILLARY
Glucose-Capillary: 104 mg/dL — ABNORMAL HIGH (ref 70–99)
Glucose-Capillary: 98 mg/dL (ref 70–99)

## 2022-06-24 SURGERY — BRONCHOSCOPY, FLEXIBLE
Anesthesia: General | Laterality: Bilateral

## 2022-06-24 MED ORDER — LIDOCAINE HCL (CARDIAC) PF 100 MG/5ML IV SOSY
PREFILLED_SYRINGE | INTRAVENOUS | Status: DC | PRN
  Administered 2022-06-24: 50 mg via INTRAVENOUS

## 2022-06-24 MED ORDER — ROCURONIUM BROMIDE 10 MG/ML (PF) SYRINGE
PREFILLED_SYRINGE | INTRAVENOUS | Status: AC
  Filled 2022-06-24: qty 10

## 2022-06-24 MED ORDER — DEXAMETHASONE SODIUM PHOSPHATE 10 MG/ML IJ SOLN
INTRAMUSCULAR | Status: DC | PRN
  Administered 2022-06-24: 5 mg via INTRAVENOUS

## 2022-06-24 MED ORDER — PROPOFOL 500 MG/50ML IV EMUL
INTRAVENOUS | Status: DC | PRN
  Administered 2022-06-24: 125 ug/kg/min via INTRAVENOUS

## 2022-06-24 MED ORDER — ACETAMINOPHEN 10 MG/ML IV SOLN: 1000.0000 mg | Freq: Once | INTRAVENOUS | Status: DC | PRN

## 2022-06-24 MED ORDER — PROPOFOL 1000 MG/100ML IV EMUL
INTRAVENOUS | Status: AC
  Filled 2022-06-24: qty 100

## 2022-06-24 MED ORDER — FENTANYL CITRATE (PF) 100 MCG/2ML IJ SOLN
INTRAMUSCULAR | Status: AC
  Filled 2022-06-24: qty 2

## 2022-06-24 MED ORDER — ONDANSETRON HCL 4 MG/2ML IJ SOLN
INTRAMUSCULAR | Status: DC | PRN
  Administered 2022-06-24: 4 mg via INTRAVENOUS

## 2022-06-24 MED ORDER — PROMETHAZINE HCL 25 MG/ML IJ SOLN: 6.2500 mg | INTRAMUSCULAR | Status: DC | PRN

## 2022-06-24 MED ORDER — LIDOCAINE HCL (PF) 2 % IJ SOLN
INTRAMUSCULAR | Status: AC
  Filled 2022-06-24: qty 5

## 2022-06-24 MED ORDER — FENTANYL CITRATE (PF) 100 MCG/2ML IJ SOLN
INTRAMUSCULAR | Status: DC | PRN
  Administered 2022-06-24 (×2): 25 ug via INTRAVENOUS
  Administered 2022-06-24: 50 ug via INTRAVENOUS

## 2022-06-24 MED ORDER — SODIUM CHLORIDE 0.9 % IV SOLN: INTRAVENOUS | Status: DC

## 2022-06-24 MED ORDER — CHLORHEXIDINE GLUCONATE 0.12 % MT SOLN
15.0000 mL | Freq: Once | OROMUCOSAL | Status: AC
  Administered 2022-06-24: 15 mL via OROMUCOSAL

## 2022-06-24 MED ORDER — ONDANSETRON HCL 4 MG/2ML IJ SOLN
INTRAMUSCULAR | Status: AC
  Filled 2022-06-24: qty 2

## 2022-06-24 MED ORDER — PHENYLEPHRINE 80 MCG/ML (10ML) SYRINGE FOR IV PUSH (FOR BLOOD PRESSURE SUPPORT)
PREFILLED_SYRINGE | INTRAVENOUS | Status: AC
  Filled 2022-06-24: qty 10

## 2022-06-24 MED ORDER — PROPOFOL 10 MG/ML IV BOLUS
INTRAVENOUS | Status: AC
  Filled 2022-06-24: qty 20

## 2022-06-24 MED ORDER — ROCURONIUM BROMIDE 100 MG/10ML IV SOLN
INTRAVENOUS | Status: DC | PRN
  Administered 2022-06-24: 40 mg via INTRAVENOUS

## 2022-06-24 MED ORDER — ORAL CARE MOUTH RINSE: 15.0000 mL | Freq: Once | OROMUCOSAL | Status: AC

## 2022-06-24 MED ORDER — PHENYLEPHRINE HCL (PRESSORS) 10 MG/ML IV SOLN
INTRAVENOUS | Status: DC | PRN
  Administered 2022-06-24: 120 ug via INTRAVENOUS

## 2022-06-24 MED ORDER — PROPOFOL 10 MG/ML IV BOLUS
INTRAVENOUS | Status: DC | PRN
  Administered 2022-06-24: 100 mg via INTRAVENOUS

## 2022-06-24 MED ORDER — CHLORHEXIDINE GLUCONATE 0.12 % MT SOLN
OROMUCOSAL | Status: AC
  Filled 2022-06-24: qty 15

## 2022-06-24 MED ORDER — SUCCINYLCHOLINE CHLORIDE 200 MG/10ML IV SOSY
PREFILLED_SYRINGE | INTRAVENOUS | Status: DC | PRN
  Administered 2022-06-24: 130 mg via INTRAVENOUS

## 2022-06-24 MED ORDER — SUCCINYLCHOLINE CHLORIDE 200 MG/10ML IV SOSY
PREFILLED_SYRINGE | INTRAVENOUS | Status: AC
  Filled 2022-06-24: qty 10

## 2022-06-24 MED ORDER — FENTANYL CITRATE (PF) 100 MCG/2ML IJ SOLN: 25.0000 ug | INTRAMUSCULAR | Status: DC | PRN

## 2022-06-24 MED ORDER — DROPERIDOL 2.5 MG/ML IJ SOLN: 0.6250 mg | Freq: Once | INTRAMUSCULAR | Status: DC | PRN

## 2022-06-24 MED ORDER — SUGAMMADEX SODIUM 200 MG/2ML IV SOLN
INTRAVENOUS | Status: DC | PRN
  Administered 2022-06-24: 200 mg via INTRAVENOUS

## 2022-06-24 MED ORDER — DEXAMETHASONE SODIUM PHOSPHATE 10 MG/ML IJ SOLN
INTRAMUSCULAR | Status: AC
  Filled 2022-06-24: qty 1

## 2022-06-24 MED ORDER — OXYCODONE HCL 5 MG/5ML PO SOLN: 5.0000 mg | Freq: Once | ORAL | Status: DC | PRN

## 2022-06-24 MED ORDER — OXYCODONE HCL 5 MG PO TABS: 5.0000 mg | ORAL_TABLET | Freq: Once | ORAL | Status: DC | PRN

## 2022-06-24 NOTE — H&P (Signed)
NAME:  Suzanne Strong, MRN:  [id number], DOB:  1945/11/09, LOS: 0 ADMISSION DATE:  06/24/2022,  CHIEF COMPLAINT:  Recurrent pneumonia   History of Present Illness:   Patient is a 77 year old female with a past medical history of tonsilar cancer, s/p chemoradiation, as well as documented recurrent aspiration who presents to clinic for the evaluation of recurrent pneumonia.   Patient is reporting increased cough that has accelerated over the past few months. She is reporting recurrent pneumonia, with 4 episodes requiring treatment with antibiotics and steroids since christmas of 2023. On review of her chart, she has been seen multiple times at her PCP's office for recurrent pneumonia. The recurrent aspirations appear to be chronic with multiple visits over the years to address this. When these episodes occur, she has fevers and chills, as well as a cough that is productive of sputum. Outside of said episodes, she always has a cough. She also reports choking on foods at times. Most of her aspiration pneumonias are treated on an ambulatory basis, thou she was admitted in April of 2022 for septic shock secondary to aspiration pneumonia. She has been evaluated multiple times by SLP, and multiple modified barium swallows have documented aspiration.   Patient has been followed closely by her primary care physician for persistent cough. During her last visit, a CT scan of the chest was ordered, showing airspace consolidation in the bilateral lower lobes. Patient was offered a G tube in the past that she declined.   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 She was also diagnosed with breast cancer (IIIa, triple negative) in January of 2017 s/p chemotherapy, lumpectomy, and sentinal lymph node biopsy, s/p radiation therapy. Patient tells me in clinic  that her diagnosis of tonsillar cancer was in the early 90's, however. I have reviewed her medical record, and the earliest noted in the record document that the diagnosis was made in 2006. Patient is currently on multiple sedating medications, and polypharmacy was noted by her primary care physician as a problem. Patient reports she's been on these medications for a while.   Patient is a former smoker, having quit in the 80's. She used to work a Health and safety inspector job.   Chest CT 05/22/2022   IMPRESSION: 1. Patchy airspace consolidation within the posterior basal segment of the left lower lobe, compatible with pneumonia and/or aspiration. 2. Persistent mildly enlarged mediastinal lymph nodes, likely reactive. 3. Mildly dilated central pulmonary vasculature, which can be seen in the setting of pulmonary arterial hypertension. 4. Aortic and coronary artery atherosclerosis (ICD10-I70.0).   Chest CT 07/22/2021   1. No evidence of pulmonary embolism. 2. Bilateral lower lung heterogeneous opacities suspicious for multifocal pneumonia superimposed on chronic scarring. Overall this is mildly improved compared to remote 05/28/2020 CT, however the associated lower lung opacities on today's radiographs are increased from recent 07/10/2021 radiographs suggesting a worsened acute process. 3. Likely reactive mediastinal lymphadenopathy.   Objective   Blood pressure (!) 166/73, pulse 93, temperature 98.9 F (37.2 C), temperature source Temporal, resp. rate 18, height 5\' 6"  (1.676 m), weight 98 kg, SpO2 99 %.       No intake or output data in the 24 hours ending 06/24/22 1221 Filed Weights   06/24/22 1115  Weight: 98 kg    Examination: Physical Exam Constitutional:      General: She is not in  acute distress.    Appearance: She is obese. She is not ill-appearing.  HENT:     Nose: Nose normal.     Mouth/Throat:     Mouth: Mucous membranes are moist.  Cardiovascular:     Rate and Rhythm: Normal rate and  regular rhythm.     Pulses: Normal pulses.     Heart sounds: Normal heart sounds.  Pulmonary:     Effort: Pulmonary effort is normal.     Breath sounds: Rales (over the bases, L>R) present.  Abdominal:     Palpations: Abdomen is soft.  Neurological:     General: No focal deficit present.     Mental Status: She is alert and oriented to person, place, and time. Mental status is at baseline.      Assessment & Plan:   1. Chronic cough 2. Aspiration pneumonia of both lower lobes, unspecified aspiration pneumonia type   Patient is presenting for the evaluation of recurrent aspiration pneumonias, with most recent Ct showing disease in the bilateral lower lobes. On my review, the chest CT is showing bilateral patchy consolidative disease, in addition to tree-in-bud opacities and bronchiectatic changes in the LLL. There is also mediastinal lymphadenopathy that appears to be similar to prior imaging in 2023.   I have reviewed the patient's chart, including her multiple visits with her primary care provider's office for cough/aspiration pneumonia. 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 The patient has known and documented aspiration of food content into her airway, which would explain her recurrent aspiration pneumonias. Furthermore, she has had documented discussions regarding having a G-tube placed for nutrition to decrease the risk of overt aspiration of food content (albeit would not eliminate silent aspiration risk). Patient refused this. Finally, the patient is on multiple psychotropic and sedating medications, all of which greatly increase her risk of aspiration, especially given her altered swallowing at baseline.   It is my impression that the tree-in-bud opacities, consolidative opacities, bronchiectasis, and scarring are all due to recurrent aspiration pneumonias.  Patient is here for flexible bronchoscopy for BAL as well as EBUS with TBNA to the mediastinal lymph nodes. Patient is appropriate for the procedure.  -proceed with flexible bronchoscopy, BAL, and EBUS with TBNA  [name], MD [facility] 06/24/2022 12:24 PM   Labs   CBC: Recent Labs  Lab 06/17/22 1557  WBC 7.4  HGB 9.4*  HCT 32.6*  MCV 75.1*  PLT 348    Basic Metabolic Panel: No results for input(s): "NA", "K", "CL", "CO2", "GLUCOSE", "BUN", "CREATININE", "CALCIUM", "MG", "PHOS" in the last 168 hours. GFR: CrCl cannot be calculated (Patient's most recent lab result is older than the maximum 21 days allowed.). Recent Labs  Lab 06/17/22 1557  WBC 7.4    Liver Function Tests: No results for input(s): "AST", "ALT", "ALKPHOS", "BILITOT", "PROT", "ALBUMIN" in the last 168 hours. No results for input(s): "LIPASE", "AMYLASE" in the last 168 hours. No results for input(s): "AMMONIA" in the last 168 hours.  ABG No results found for: "PHART", "PCO2ART", "PO2ART", "HCO3", "TCO2", "ACIDBASEDEF", "O2SAT"   Coagulation Profile: No results for input(s): "INR", "PROTIME" in the last 168 hours.  Cardiac Enzymes: No results for input(s): "CKTOTAL", "CKMB", "CKMBINDEX", "TROPONINI" in the last 168 hours.  HbA1C: Hemoglobin A1C  Date/Time Value Ref Range Status  12/09/2016 12:00 AM 7.3  Final    Comment:    Dr Darrick Penna  everywhere   Hgb A1c MFr Bld  Date/Time Value Ref Range Status  12/11/2020 03:42 AM 6.5 (H) 4.8 - 5.6 % Final    Comment:    (NOTE) Pre diabetes:          5.7%-6.4%  Diabetes:              >6.4%  Glycemic control for   <7.0% adults with diabetes   05/28/2020 10:38 PM 6.7 (H) 4.8 - 5.6 % Final    Comment:    (NOTE) Pre diabetes:          5.7%-6.4%  Diabetes:              >6.4%  Glycemic control for   <7.0% adults with diabetes     CBG: Recent Labs  Lab 06/24/22 1142  GLUCAP 104*    Review of Systems:   Review of  Systems  Constitutional:  Negative for chills, fever and weight loss.  Respiratory:  Positive for cough and sputum production.   Cardiovascular:  Negative for chest pain.     Past Medical History:  She,  has a past medical history of Anemia, Anxiety, Breast cancer (HCC) (02/25/2015), Cervical dysplasia, Diabetes mellitus without complication (HCC), Diverticulosis, Dyspnea, Fever (04/11/2015), Gastroparesis, GERD (gastroesophageal reflux disease), Hyperlipidemia, Hypertension, Neuromuscular disorder (HCC), Neuropathy, Personal history of chemotherapy, Personal history of radiation therapy (2017), Pneumonia, RLS (restless legs syndrome), Shingles, Sleep apnea, 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HCC) (2006), and Wears dentures.   Surgical History:   Past Surgical History:  Procedure Laterality Date   APPENDECTOMY     BREAST BIOPSY Left 02/25/2015   INVASIVE MAMMARY CARCINOMA,Triple negative.   BREAST CYST ASPIRATION     BREAST EXCISIONAL BIOPSY Left 12/2015   surgery   BREAST LUMPECTOMY WITH NEEDLE LOCALIZATION Left 10/02/2015   Procedure: BREAST LUMPECTOMY WITH NEEDLE LOCALIZATION;  Surgeon: Earline Mayotte, MD;  Location: [facility];  Service: General;  Laterality: Left;   BREAST LUMPECTOMY WITH SENTINEL LYMPH NODE BIOPSY Left 10/02/2015   Procedure: LEFT BREAST WIDE EXCISION WITH SENTINEL LYMPH NODE BX;  Surgeon: Earline Mayotte, MD;  Location: [facility];  Service: General;  Laterality: Left;   BREAST SURGERY     breast biopsy benign   CARPAL TUNNEL RELEASE Right 05/19/2017   Procedure: CARPAL TUNNEL RELEASE;  Surgeon: Deeann Saint, MD;  Location: [facility];  Service: Orthopedics;  Laterality: Right;   CATARACT EXTRACTION W/PHACO Right 10/26/2018   Procedure: CATARACT EXTRACTION PHACO AND INTRAOCULAR LENS PLACEMENT (IOC) right diabetic;  Surgeon: Lockie Mola, MD;  Location: [facility];  Service: Ophthalmology;  Laterality: Right;  Diabetic - oral meds cancer center been notified and  will come   CATARACT EXTRACTION W/PHACO Left 11/16/2018   Procedure: CATARACT EXTRACTION PHACO AND INTRAOCULAR LENS PLACEMENT (IOC) LEFT DIABETIC  01:01.0  17.0%  10.37;  Surgeon: Lockie Mola, MD;  Location: [facility];  Service: Ophthalmology;  Laterality: Left;  Diabetic - oral meds Port-a-cath   CHOLECYSTECTOMY     Cologuard  07/22/2016   Negative   COLONOSCOPY WITH PROPOFOL N/A 05/24/2020   Procedure: COLONOSCOPY WITH PROPOFOL;  Surgeon: Pasty Spillers, MD;  Location: [facility];  Service: Endoscopy;  Laterality: N/A;   ESOPHAGOGASTRODUODENOSCOPY  07/2009   normal with few gastric polyps   ESOPHAGOGASTRODUODENOSCOPY (EGD) WITH PROPOFOL N/A 05/24/2020   Procedure: ESOPHAGOGASTRODUODENOSCOPY (EGD) WITH PROPOFOL;  Surgeon: Pasty Spillers, MD;  Location: [facility];  Service: Endoscopy;  Laterality: N/A;   MOLE REMOVAL     PORT-A-CATH REMOVAL  PORTACATH PLACEMENT     PORTACATH PLACEMENT Right 03/12/2015   Procedure: INSERTION PORT-A-CATH;  Surgeon: Earline Mayotte, MD;  Location: [facility];  Service: General;  Laterality: Right;   RADICAL NECK DISSECTION     TONSILLECTOMY Left    cancer treated with chemo and radiation   TRIGGER FINGER RELEASE Right 05/19/2017   Procedure: RELEASE TRIGGER FINGER/A-1 PULLEY ring finger;  Surgeon: Deeann Saint, MD;  Location: [facility];  Service: Orthopedics;  Laterality: Right;     Social History:   reports that she quit smoking about 39 years ago. Her smoking use included cigarettes. She smoked an average of .5 packs per day. She has never used smokeless tobacco. She reports that she does not drink alcohol and does not use drugs.   Family History:  Her family history includes Alcohol abuse in her father; Cancer in her father and mother; Depression in her mother; Heart disease in her father; Hyperlipidemia in her brother and mother; Hypertension in her brother; Osteoporosis in her mother. There is no history of  Breast cancer.   Allergies Allergies  Allergen Reactions   Amoxicillin Swelling    REACTION: rash, swelling Has patient had a PCN reaction causing immediate rash, facial/tongue/throat swelling, SOB or lightheadedness with hypotension: yes Has patient had a PCN reaction causing severe rash involving mucus membranes or skin necrosis: no Has patient had a PCN reaction that required hospitalization: no Has patient had a PCN reaction occurring within the last 10 years: yes If all of the above answers are "NO", then may proceed with Cephalosporin use.  Has tolerated ceftriaxone   Penicillin G Anaphylaxis    Has tolerated ceftriaxone on multiple occasions    Fluconazole Rash    REACTION: hives   Difuleron [Ferrous Fumarate-Dss]    Other Other (See Comments)    Pt has had tonsil cancer (on Left side) - pt may have difficulty swallowing     Home Medications  Prior to Admission medications   Medication Sig Start Date End Date Taking? Authorizing Provider  acetaminophen (TYLENOL) 325 MG tablet Take 2 tablets (650 mg total) by mouth every 6 (six) hours as needed for mild pain (or Fever >/= 101). 05/30/20  Yes Zannie Cove, MD  albuterol (PROVENTIL) (2.5 MG/3ML) 0.083% nebulizer solution Take 3 mLs (2.5 mg total) by nebulization in the morning and at bedtime. 05/26/22 05/26/23 Yes Duanne Duchesne, Lianne Bushy, MD  albuterol (VENTOLIN HFA) 108 (90 Base) MCG/ACT inhaler Inhale 2 puffs into the lungs every 6 (six) hours as needed for wheezing or shortness of breath. 03/10/22  Yes Tower, [name], MD  ALPRAZolam (XANAX) 0.5 MG tablet Take 1 tablet (0.5 mg total) by mouth 2 (two) times daily as needed for anxiety. 05/12/22 06/24/22 Yes Corie Chiquito, PMHNP  amitriptyline (ELAVIL) 50 MG tablet Take 1 tablet (50 mg total) by mouth at bedtime. 12/07/16  Yes Earna Coder, MD  amLODipine (NORVASC) 5 MG tablet Take 1 tablet (5 mg total) by mouth daily. 03/10/22  Yes Tower, [name], MD  atorvastatin (LIPITOR) 10 MG  tablet TAKE ONE TABLET BY MOUTH ONCE DAILY 03/10/22  Yes Tower, [name], MD  benzonatate (TESSALON) 200 MG capsule Take 1 capsule (200 mg total) by mouth 3 (three) times daily as needed for cough. Swallow whole 05/21/22  Yes Bedsole, Amy E, MD  busPIRone (BUSPAR) 15 MG tablet Take 1.5 tabs po BID 05/04/22  Yes Corie Chiquito, PMHNP  chlorpheniramine-HYDROcodone (TUSSIONEX) 10-8 MG/5ML Take 5 mLs by mouth every 12 (twelve) hours  as needed for cough. Caution of sedation 05/21/22  Yes Bedsole, Amy E, MD  famotidine (PEPCID) 20 MG tablet Take 1 tablet (20 mg total) by mouth 2 (two) times daily. 03/10/22  Yes Tower, [name], MD  glipiZIDE (GLUCOTROL) 5 MG tablet Take 2 tablets (10 mg total) by mouth daily before breakfast. 08/18/21  Yes Joaquim Nam, MD  glucose blood (ONETOUCH ULTRA) test strip USE TO check blood glucose ONCE DAILY AND AS NEEDED FOR DIABETES 12/03/20  Yes Tower, [name], MD  omeprazole (PRILOSEC) 20 MG capsule TAKE ONE CAPSULE BY MOUTH EVERYDAY AT BEDTIME 03/10/22  Yes Tower, Marne A, MD  pregabalin (LYRICA) 150 MG capsule Take 300 mg by mouth at bedtime. 09/12/20  Yes [provider]  rOPINIRole (REQUIP) 1 MG tablet TAKE 1/2 TABLET BY MOUTH EVERY MORNING and TAKE THREE TABLETS BY MOUTH EVERYDAY AT BEDTIME 03/10/22  Yes Tower, Marne A, MD  sertraline (ZOLOFT) 100 MG tablet Take 2 tablets (200 mg total) by mouth every morning. 05/04/22 08/02/22 Yes Corie Chiquito, PMHNP  sodium chloride HYPERTONIC 3 % nebulizer solution Take by nebulization as needed for other. 05/26/22  Yes Aisley Whan, Lianne Bushy, MD  b complex vitamins tablet Take 1 tablet by mouth daily.    [provider]  BIOTIN PO Take by mouth.    [provider]  Cholecalciferol (VITAMIN D-1000 MAX ST) 25 MCG (1000 UT) tablet Take 2,000 Units by mouth daily. Takes three 50 mcg tablets, 3 capsules daily    [provider]  doxycycline (VIBRA-TABS) 100 MG tablet Take 100 mg by mouth 2 (two) times daily. Patient not  taking: Reported on 06/24/2022    [provider]  Emollient (COLLAGEN EX)     [provider]  ferrous sulfate 325 (65 FE) MG tablet Take 1 tablet (325 mg total) by mouth 2 (two) times daily with a meal. 05/30/20   Zannie Cove, MD  metFORMIN (GLUCOPHAGE) 500 MG tablet Take 2 tablets (1,000 mg total) by mouth 2 (two) times daily with a meal. Patient taking differently: Take 2,000 mg by mouth daily with breakfast. 08/18/21   Joaquim Nam, MD  Multiple Vitamins-Minerals (WOMENS MULTIVITAMIN PO) Take by mouth.    [provider]  ondansetron (ZOFRAN) 4 MG tablet Take 1 tablet (4 mg total) by mouth every 8 (eight) hours as needed for nausea or vomiting. 02/06/22   Tower, [name], MD  OZEMPIC, 0.25 OR 0.5 MG/DOSE, 2 MG/3ML SOPN Inject into the skin.    [provider]  temazepam (RESTORIL) 15 MG capsule Take 1 capsule (15 mg total) by mouth at bedtime as needed for sleep. 05/12/22   Corie Chiquito, PMHNP  zinc gluconate 50 MG tablet Take 50 mg by mouth daily.    [provider]

## 2022-06-24 NOTE — Op Note (Signed)
Flexible and EBUS Bronchoscopy Procedure Note  Suzanne Strong  [id number]  02-Mar-1945  Date:06/24/22  Time:1:43 PM   Provider Performing:Jacqlyn Marolf   Procedure: Flexible bronchoscopy and EBUS Bronchoscopy  Indication(s) Recurrent aspiration pneumonia  Consent Risks of the procedure as well as the alternatives and risks of each were explained to the patient and/or caregiver.  Consent for the procedure was obtained.  Anesthesia General Anesthesia   Time Out Verified patient identification, verified procedure, site/side was marked, verified correct patient position, special equipment/implants available, medications/allergies/relevant history reviewed, required imaging and test results available.   Sterile Technique Usual hand hygiene, masks, gowns, and gloves were used   Procedure Description There were thick secretions in the oropharynx and above the level of the vocal cords at the time of intubation by anesthesia. Following this, the diagnostic bronchoscope was advanced through endotracheal tube and into airway.  Airways were examined down to subsegmental level. There were thick secretions at the level of the carina as well as in the right and left mainstem bronchi. No endobronchial lesions noted. Following this, I advanced the bronchoscope to the superior segment of the LLL (LB6) and BAL was performed 100 mL instilled, 30 mL was taken out, and sent for cell count and culture. A washing was then performed in the RLL, 20 mL suctioned and sent for culture.  The diagnostic bronchoscope was then removed and the EBUS bronchoscope was advanced into airway with stations 7 biopsied with a 21G ViziShot 2 needle (6 passes, 3 for cytology, 2 for flow cytometry, and 1 for culture). No other enlarged lymph nodes were encountered. The EBUS bronchoscope was removed after assuring no active bleeding from biopsy site.  Findings: Enlarged subcarinal lymphadenopathy. Recurrent  aspiration.   Complications/Tolerance None; patient tolerated the procedure well. Chest X-ray is not needed post procedure.   EBL Minimal   Specimen(s) EBUS TBNA to station 7, sent for cytology, flow and culture (aerobic, anaerobic, fungal, AFB) BAL to the LLL (LB6), sample sent for cell count and culture (aerobic, anaerobic, fungal, and AFB) Washing to the RLL (sample sent for aerobic and anaerobic culture).   [name], MD [facility] 06/24/2022 1:48 PM

## 2022-06-24 NOTE — Transfer of Care (Signed)
Immediate Anesthesia Transfer of Care Note  Patient: ROCHELE PHILSON  Procedure(s) Performed: FLEXIBLE BRONCHOSCOPY (Bilateral) VIDEO BRONCHOSCOPY WITH ENDOBRONCHIAL ULTRASOUND (Bilateral)  Patient Location: PACU  Anesthesia Type:General  Level of Consciousness: awake, alert , and patient cooperative  Airway & Oxygen Therapy: Patient Spontanous Breathing and Patient connected to face mask oxygen  Post-op Assessment: Report given to RN and Post -op Vital signs reviewed and stable  Post vital signs: Reviewed and stable  Last Vitals:  Vitals Value Taken Time  BP 161/54 06/24/22 1339  Temp 36.7 C 06/24/22 1339  Pulse 91 06/24/22 1343  Resp 24 06/24/22 1343  SpO2 100 % 06/24/22 1343  Vitals shown include unvalidated device data.  Last Pain:  Vitals:   06/24/22 1115  TempSrc: Temporal  PainSc: 0-No pain         Complications:  Encounter Notable Events  Notable Event Outcome Phase Comment  Difficult to intubate - expected  Intraprocedure Filed from anesthesia note documentation.

## 2022-06-24 NOTE — Discharge Instructions (Signed)

## 2022-06-24 NOTE — Anesthesia Procedure Notes (Signed)
Procedure Name: Intubation Date/Time: 06/24/2022 12:59 PM  Performed by: Jeannene Patella, CRNAPre-anesthesia Checklist: Patient identified, Emergency Drugs available, Suction available, Patient being monitored and Timeout performed Patient Re-evaluated:Patient Re-evaluated prior to induction Oxygen Delivery Method: Circle system utilized Preoxygenation: Pre-oxygenation with 100% oxygen Induction Type: IV induction Ventilation: Mask ventilation with difficulty Laryngoscope Size: McGraph and 4 Grade View: Grade III Tube type: Oral (cricoid pressure brought glottis into view) Tube size: 8.5 mm Number of attempts: 1 Airway Equipment and Method: Stylet and Video-laryngoscopy Placement Confirmation: ETT inserted through vocal cords under direct vision, positive ETCO2 and breath sounds checked- equal and bilateral Secured at: 20 (at lip) cm Tube secured with: Tape Dental Injury: Teeth and Oropharynx as per pre-operative assessment  Difficulty Due To: Difficulty was anticipated Future Recommendations: Recommend- induction with short-acting agent, and alternative techniques readily available

## 2022-06-24 NOTE — Anesthesia Preprocedure Evaluation (Signed)
Anesthesia Evaluation  Patient identified by MRN, date of birth, ID band Patient awake    Reviewed: Allergy & Precautions, H&P , NPO status , Patient's Chart, lab work & pertinent test results, reviewed documented beta blocker date and time   Airway Mallampati: II  TM Distance: <3 FB Neck ROM: limited Positive for:  Tracheal deviation Mouth opening: Limited Mouth Opening  Dental  (+) Teeth Intact, Edentulous Lower   Pulmonary shortness of breath, sleep apnea , pneumonia, resolved, former smoker   Pulmonary exam normal        Cardiovascular Exercise Tolerance: Poor hypertension, On Medications negative cardio ROS Normal cardiovascular exam Rhythm:regular Rate:Normal     Neuro/Psych  PSYCHIATRIC DISORDERS Anxiety Depression     Neuromuscular disease    GI/Hepatic Neg liver ROS,GERD  Medicated,,  Endo/Other  negative endocrine ROSdiabetes, Well Controlled, Type 2    Renal/GU negative Renal ROS  negative genitourinary   Musculoskeletal   Abdominal   Peds  Hematology  (+) Blood dyscrasia, anemia   Anesthesia Other Findings Past Medical History: No date: Anemia     Comment:  iron deficiency and B12 deficiency No date: Anxiety 02/25/2015: Breast cancer (HCC)     Comment:  upper-outer quadrant of left female breast, Triple               Negative. Neo-adjuvant chemo with complete pathologic               response.  No date: Cervical dysplasia     Comment:  conization No date: Diabetes mellitus without complication (HCC) No date: Diverticulosis No date: Dyspnea 04/11/2015: Fever No date: Gastroparesis     Comment:  related to previous radiation tx No date: GERD (gastroesophageal reflux disease) No date: Hyperlipidemia No date: Hypertension No date: Neuromuscular disorder (HCC) No date: Neuropathy     Comment:  legs/feet, S/P chemo meds No date: Personal history of chemotherapy 2017: Personal history of radiation  therapy     Comment:  LEFT lumpectomy w/ radiation No date: Pneumonia No date: RLS (restless legs syndrome) No date: Shingles     Comment:  Hx of No date: Sleep apnea 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 No date: BREAST CYST ASPIRATION 12/2015: BREAST EXCISIONAL BIOPSY; Left     Comment:  surgery 10/02/2015: BREAST LUMPECTOMY WITH NEEDLE LOCALIZATION; Left     Comment:  Procedure: BREAST LUMPECTOMY WITH NEEDLE LOCALIZATION;                Surgeon: Earline Mayotte, MD;  Location: [facility];                Service: General;  Laterality: Left; 10/02/2015: BREAST LUMPECTOMY WITH SENTINEL LYMPH NODE BIOPSY; Left     Comment:  Procedure: LEFT BREAST WIDE EXCISION WITH SENTINEL LYMPH              NODE BX;  Surgeon: Earline Mayotte, MD;  Location: [facility]              ORS;  Service: General;  Laterality: Left; No date: BREAST SURGERY     Comment:  breast biopsy benign 05/19/2017: CARPAL TUNNEL RELEASE; Right     Comment:  Procedure: CARPAL TUNNEL RELEASE;  Surgeon: Deeann Saint, MD;  Location:  [facility];  Service: Orthopedics;                Laterality: Right; 10/26/2018: CATARACT EXTRACTION W/PHACO; Right     Comment:  Procedure: CATARACT EXTRACTION PHACO AND INTRAOCULAR               LENS PLACEMENT (IOC) right diabetic;  Surgeon:               Lockie Mola, MD;  Location: [facility];              Service: Ophthalmology;  Laterality: Right;  Diabetic -               oral meds cancer center been notified and will come 11/16/2018: CATARACT EXTRACTION W/PHACO; Left     Comment:  Procedure: CATARACT EXTRACTION PHACO AND INTRAOCULAR               LENS PLACEMENT (IOC) LEFT DIABETIC  01:01.0  17.0%                10.37;  Surgeon: Lockie Mola, MD;  Location:                [facility];  Service: Ophthalmology;                Laterality: Left;  Diabetic - oral meds Port-a-cath No date: CHOLECYSTECTOMY 07/22/2016: Cologuard     Comment:  Negative 05/24/2020: COLONOSCOPY WITH PROPOFOL; N/A     Comment:  Procedure: COLONOSCOPY WITH PROPOFOL;  Surgeon:               Pasty Spillers, MD;  Location: [facility];                Service: Endoscopy;  Laterality: N/A; 07/2009: ESOPHAGOGASTRODUODENOSCOPY     Comment:  normal with few gastric polyps 05/24/2020: ESOPHAGOGASTRODUODENOSCOPY (EGD) WITH PROPOFOL; N/A     Comment:  Procedure: ESOPHAGOGASTRODUODENOSCOPY (EGD) WITH               PROPOFOL;  Surgeon: Pasty Spillers, MD;  Location:               [facility];  Service: Endoscopy;  Laterality: N/A; No date: MOLE REMOVAL No date: PORT-A-CATH REMOVAL No date: PORTACATH PLACEMENT 03/12/2015: PORTACATH PLACEMENT; Right     Comment:  Procedure: INSERTION PORT-A-CATH;  Surgeon: Earline Mayotte, MD;  Location: [facility];  Service: General;                Laterality: Right; No date: RADICAL NECK DISSECTION No date: TONSILLECTOMY; Left     Comment:  cancer treated with chemo and radiation 05/19/2017: TRIGGER FINGER RELEASE; Right     Comment:  Procedure: RELEASE TRIGGER FINGER/A-1 PULLEY ring               finger;  Surgeon: Deeann Saint, MD;  Location: [facility]               ORS;  Service: Orthopedics;  Laterality: Right; BMI    Body Mass Index: 34.86 kg/m     Reproductive/Obstetrics negative OB ROS                             Anesthesia Physical Anesthesia Plan  ASA: 3  Anesthesia Plan: General ETT and General   Post-op Pain Management:    Induction: Cricoid pressure planned,  Rapid sequence and Intravenous  PONV Risk Score and Plan: 4 or greater  Airway Management Planned: Video Laryngoscope Planned  Additional Equipment:   Intra-op Plan:   Post-operative Plan:   Informed Consent: I  have reviewed the patients History and Physical, chart, labs and discussed the procedure including the risks, benefits and alternatives for the proposed anesthesia with the patient or authorized representative who has indicated his/her understanding and acceptance.     Dental Advisory Given  Plan Discussed with: CRNA  Anesthesia Plan Comments: (Risks of Difficult airway discussed with patient. ja)       Anesthesia Quick Evaluation

## 2022-06-25 ENCOUNTER — Encounter: Payer: Self-pay | Admitting: Student in an Organized Health Care Education/Training Program

## 2022-06-25 LAB — CYTOLOGY - NON PAP

## 2022-06-25 LAB — CULTURE, RESPIRATORY W GRAM STAIN

## 2022-06-25 NOTE — Anesthesia Postprocedure Evaluation (Signed)
Anesthesia Post Note  Patient: Suzanne Strong  Procedure(s) Performed: FLEXIBLE BRONCHOSCOPY (Bilateral) VIDEO BRONCHOSCOPY WITH ENDOBRONCHIAL ULTRASOUND (Bilateral)  Anesthesia Type: General Anesthetic complications: yes   Encounter Notable Events  Notable Event Outcome Phase Comment  Difficult to intubate - expected  Intraprocedure Filed from anesthesia note documentation.     Last Vitals:  Vitals:   06/24/22 1411 06/24/22 1423  BP: (!) 150/64 (!) 156/70  Pulse: 90 92  Resp: (!) 26 17  Temp: 36.7 C 36.4 C  SpO2: 92% 100%    Last Pain:  Vitals:   06/24/22 1423  TempSrc: Temporal  PainSc: 0-No pain                 Yevette Edwards

## 2022-06-26 ENCOUNTER — Other Ambulatory Visit: Payer: Self-pay | Admitting: Student in an Organized Health Care Education/Training Program

## 2022-06-26 DIAGNOSIS — J690 Pneumonitis due to inhalation of food and vomit: Secondary | ICD-10-CM

## 2022-06-26 LAB — CULTURE, RESPIRATORY W GRAM STAIN: Culture: NO GROWTH

## 2022-06-26 MED ORDER — LEVOFLOXACIN 750 MG PO TABS
750.0000 mg | ORAL_TABLET | Freq: Every day | ORAL | 0 refills | Status: DC
Start: 2022-06-26 — End: 2022-06-30

## 2022-06-26 NOTE — Progress Notes (Signed)
Reviewed culture results from BAL, growing pan sensitive Serratia. Sent a prescription for Levofloxacin 750 mg for 7 days. Patient given instructions on signs of tendinitis. Also asked to hold her glipizide given potential for interaction and contact her endocrinologist. I will continue to monitor culture results from [facility] of station [address].  Suzanne Chute, MD [facility] 06/26/2022 2:42 PM    Results for orders placed or performed during the hospital encounter of 06/24/22  Culture, Respiratory w Gram Stain     Status: None (Preliminary result)   Collection Time: 06/24/22  2:00 PM   Specimen: Bronchoalveolar Lavage; Respiratory  Result Value Ref Range Status   Specimen Description   Final    BRONCHIAL ALVEOLAR LAVAGE LEFT Performed at [facility], [address] [place], [place] [address]    Special Requests   Final    NONE Performed at [facility], [address] [place], [place] [address]    Gram Stain   Final    RARE WBC PRESENT, PREDOMINANTLY PMN GRAM VARIABLE ROD    Culture   Final    NO GROWTH 2 DAYS Performed at [facility], [address], [place], [place] [address]    Report Status PENDING  Incomplete  Culture, Respiratory w Gram Stain     Status: None (Preliminary result)   Collection Time: 06/24/22  2:01 PM   Specimen: Lymph Node; Respiratory  Result Value Ref Range Status   Specimen Description LYMPH NODE  Final   Special Requests STATION [address]  Final   Gram Stain NO WBC SEEN RARE GRAM POSITIVE RODS   Final   Culture   Final    CULTURE REINCUBATED FOR BETTER GROWTH Performed at [facility], [address], [place], [place] [address]    Report Status PENDING  Incomplete  Culture, Respiratory w Gram Stain     Status: None (Preliminary result)   Collection Time: 06/24/22  2:01 PM   Specimen: Bronchoalveolar Lavage; Lymph Node  Result Value Ref Range Status   Specimen Description   Final    BRONCHIAL ALVEOLAR  LAVAGE Performed at [facility], [address], [place], [place] [address]    Special Requests   Final    RIGHT WASHING Performed at [facility], [address] [place], [place] [address]    Gram Stain   Final    MODERATE WBC PRESENT, PREDOMINANTLY PMN MODERATE GRAM POSITIVE COCCI IN PAIRS FEW GRAM NEGATIVE RODS    Culture   Final    MODERATE SERRATIA MARCESCENS CULTURE REINCUBATED FOR BETTER GROWTH Performed at [facility], [address], [place], [place] [address]    Report Status PENDING  Incomplete   Organism ID, Bacteria SERRATIA MARCESCENS  Final      Susceptibility   Serratia marcescens - MIC*    CEFEPIME <=0.12 SENSITIVE Sensitive     CEFTAZIDIME <=1 SENSITIVE Sensitive     CEFTRIAXONE <=0.25 SENSITIVE Sensitive     CIPROFLOXACIN <=0.25 SENSITIVE Sensitive     GENTAMICIN <=1 SENSITIVE Sensitive     TRIMETH/SULFA <=20 SENSITIVE Sensitive     * MODERATE SERRATIA MARCESCENS

## 2022-06-27 LAB — ACID FAST SMEAR (AFB, MYCOBACTERIA)
Acid Fast Smear: NEGATIVE
Acid Fast Smear: NEGATIVE
Acid Fast Smear: NEGATIVE

## 2022-06-27 LAB — CULTURE, RESPIRATORY W GRAM STAIN

## 2022-06-28 LAB — CULTURE, RESPIRATORY W GRAM STAIN: Gram Stain: NONE SEEN

## 2022-06-29 LAB — CULTURE, RESPIRATORY W GRAM STAIN

## 2022-06-30 ENCOUNTER — Telehealth: Payer: Self-pay | Admitting: Student in an Organized Health Care Education/Training Program

## 2022-06-30 DIAGNOSIS — J690 Pneumonitis due to inhalation of food and vomit: Secondary | ICD-10-CM

## 2022-06-30 LAB — FUNGUS CULTURE WITH STAIN

## 2022-06-30 LAB — FUNGUS CULTURE RESULT

## 2022-06-30 MED ORDER — SULFAMETHOXAZOLE-TRIMETHOPRIM 800-160 MG PO TABS
1.0000 | ORAL_TABLET | Freq: Two times a day (BID) | ORAL | 0 refills | Status: AC
Start: 2022-06-30 — End: 2022-07-07

## 2022-06-30 NOTE — Telephone Encounter (Signed)
I spoke with the patient. She started the Levaquin on 06/26/2022. She started running a fever of 101 last night.  She started having soreness in her lower leg as well, but no swelling that she can tell. She has taken Tylenol and her fever is now between 98-99. She has not taken the Levaquin today. She is waiting to see if it is ok to take it.

## 2022-06-30 NOTE — Telephone Encounter (Signed)
Patient would like the nurse to call regarding an antibiotic she was supposed to take for 7 days.  She is currently running a fever and would like the nurse to call to discuss further.  CB# [phone]

## 2022-06-30 NOTE — Telephone Encounter (Signed)
I have notified the patient. She is aware that if her symptoms persist she will need to contact her PCP.  Nothing further needed.

## 2022-07-08 LAB — FUNGUS CULTURE WITH STAIN

## 2022-07-10 ENCOUNTER — Emergency Department: Payer: HMO

## 2022-07-10 ENCOUNTER — Other Ambulatory Visit: Payer: Self-pay

## 2022-07-10 ENCOUNTER — Emergency Department
Admission: EM | Admit: 2022-07-10 | Discharge: 2022-07-10 | Disposition: A | Discharge status: Home / Self Care | Payer: HMO | Attending: Emergency Medicine | Admitting: Emergency Medicine

## 2022-07-10 DIAGNOSIS — E119 Type 2 diabetes mellitus without complications: Secondary | ICD-10-CM | POA: Insufficient documentation

## 2022-07-10 DIAGNOSIS — Z85818 Personal history of malignant neoplasm of other sites of lip, oral cavity, and pharynx: Secondary | ICD-10-CM | POA: Insufficient documentation

## 2022-07-10 DIAGNOSIS — E860 Dehydration: Secondary | ICD-10-CM | POA: Insufficient documentation

## 2022-07-10 DIAGNOSIS — R531 Weakness: Secondary | ICD-10-CM | POA: Insufficient documentation

## 2022-07-10 DIAGNOSIS — I10 Essential (primary) hypertension: Secondary | ICD-10-CM | POA: Diagnosis not present

## 2022-07-10 DIAGNOSIS — R197 Diarrhea, unspecified: Secondary | ICD-10-CM | POA: Diagnosis not present

## 2022-07-10 LAB — CBC
HCT: 36 % (ref 36.0–46.0)
Hemoglobin: 10.5 g/dL — ABNORMAL LOW (ref 12.0–15.0)
MCH: 21.3 pg — ABNORMAL LOW (ref 26.0–34.0)
MCHC: 29.2 g/dL — ABNORMAL LOW (ref 30.0–36.0)
MCV: 73 fL — ABNORMAL LOW (ref 80.0–100.0)
Platelets: 327 10*3/uL (ref 150–400)
RBC: 4.93 MIL/uL (ref 3.87–5.11)
RDW: 18.2 % — ABNORMAL HIGH (ref 11.5–15.5)
WBC: 9 10*3/uL (ref 4.0–10.5)
nRBC: 0 % (ref 0.0–0.2)

## 2022-07-10 LAB — URINALYSIS, ROUTINE W REFLEX MICROSCOPIC
Bilirubin Urine: NEGATIVE
Glucose, UA: NEGATIVE mg/dL
Hgb urine dipstick: NEGATIVE
Ketones, ur: 5 mg/dL — AB
Nitrite: NEGATIVE
Protein, ur: 30 mg/dL — AB
Specific Gravity, Urine: 1.018 (ref 1.005–1.030)
pH: 5 (ref 5.0–8.0)

## 2022-07-10 LAB — BASIC METABOLIC PANEL
Anion gap: 13 (ref 5–15)
BUN: 31 mg/dL — ABNORMAL HIGH (ref 8–23)
CO2: 24 mmol/L (ref 22–32)
Calcium: 9 mg/dL (ref 8.9–10.3)
Chloride: 97 mmol/L — ABNORMAL LOW (ref 98–111)
Creatinine, Ser: 1.69 mg/dL — ABNORMAL HIGH (ref 0.44–1.00)
GFR, Estimated: 31 mL/min — ABNORMAL LOW (ref >60)
Glucose, Bld: 175 mg/dL — ABNORMAL HIGH (ref 70–99)
Potassium: 4.2 mmol/L (ref 3.5–5.1)
Sodium: 134 mmol/L — ABNORMAL LOW (ref 135–145)

## 2022-07-10 MED ORDER — SODIUM CHLORIDE 0.9 % IV BOLUS
1000.0000 mL | Freq: Once | INTRAVENOUS | Status: AC
  Administered 2022-07-10: 1000 mL via INTRAVENOUS

## 2022-07-10 NOTE — ED Triage Notes (Signed)
Pt to ED stating "I think I'm dehydrated" and vague weakness since 4 days ago.  Pt states has been on 2 different antibiotics for bacterial lung infection to L lung diagnosed with "wash" 3 weeks ago.  Denies NVD. Ambulatory to triage room.

## 2022-07-10 NOTE — Discharge Instructions (Addendum)
Please drink plenty of fluids over the next several days.  Please follow-up with your doctor next week for recheck/reevaluation.  Return to the emergency department for any symptom personally concerning to yourself.

## 2022-07-10 NOTE — ED Provider Notes (Signed)
[facility] Provider Note    Event Date/Time   First MD Initiated Contact with Patient 07/10/22 1959     (approximate)  History   Chief Complaint: Weakness  HPI  Suzanne Strong is a 77 y.o. female with a past medical history of anemia, diabetes, hypertension, hyperlipidemia, prior tonsillar cancer years ago who presents to the emergency department for concerns of dehydration.  According to the patient for the past 4 days she has been experiencing mild diarrhea which she relates to antibiotics that she is taking for possible lung infection.  Patient denies any cough congestion shortness of breath or fever.  Patient states since her tonsillar cancer treatments years ago she has had trouble tolerating fluids and now she has noted dark urine and believes she could be getting dehydrated so she came to the emergency department for evaluation.  Denies any nausea or vomiting.  No abdominal pain.  No chest pain.  Physical Exam   Triage Vital Signs: ED Triage Vitals  Enc Vitals Group     BP 07/10/22 1810 (!) 160/88     Pulse Rate 07/10/22 1810 (!) 111     Resp 07/10/22 1810 20     Temp 07/10/22 1810 98 F (36.7 C)     Temp Source 07/10/22 1810 Oral     SpO2 07/10/22 1810 92 %     Weight 07/10/22 1811 206 lb (93.4 kg)     Height 07/10/22 1811 5\' 6"  (1.676 m)     Head Circumference --      Peak Flow --      Pain Score 07/10/22 1811 0     Pain Loc --      Pain Edu? --      Excl. in GC? --     Most recent vital signs: Vitals:   07/10/22 1810  BP: (!) 160/88  Pulse: (!) 111  Resp: 20  Temp: 98 F (36.7 C)  SpO2: 92%    General: Awake, no distress.  CV:  Good peripheral perfusion.  Regular rate and rhythm  Resp:  Normal effort.  Equal breath sounds bilaterally.  Abd:  No distention.  Soft, nontender.  No rebound or guarding.  ED Results / Procedures / Treatments   EKG  EKG viewed and interpreted by myself shows sinus tachycardia at 106 bpm with a  narrow QRS, normal axis, normal intervals, no concerning ST changes.  RADIOLOGY  I have reviewed and interpreted the chest x-ray images.  No consolidation seen on my evaluation.  Radiology is read the x-ray as patchy opacity in left lower lobe possible residual infiltrate versus atelectasis   MEDICATIONS ORDERED IN ED: Medications  sodium chloride 0.9 % bolus 1,000 mL (has no administration in time range)  sodium chloride 0.9 % bolus 1,000 mL (has no administration in time range)     IMPRESSION / MDM / ASSESSMENT AND PLAN / ED COURSE  I reviewed the triage vital signs and the nursing notes.  Patient's presentation is most consistent with acute presentation with potential threat to life or bodily function.  Patient presents emergency department with concerns of dehydration.  States she has had some mild diarrhea for the past 4 days which she relates to the antibiotics that she has been taking for a possible lung infection.  Denies any cough congestion fever shortness of breath or chest pain.  No abdominal pain.  Labs have resulted showing renal insufficiency with creatinine 1.69 from a baseline of 1.154 months ago  likely indicating dehydration with anion gap of 13.  Patient CBC shows a normal white blood cell count no concerning findings.  Urinalysis is pending.  Will begin IV hydration and continue to closely monitor.  Patient agreeable to plan of care.  Patient states he is feeling much better.  She is sitting on the side of the bed asking to be discharged home.  Urinalysis shows no concerning findings besides mild ketones.  Patient received IV fluids.  Will discharge with PCP follow-up.  Patient agreeable to plan of care.  FINAL CLINICAL IMPRESSION(S) / ED DIAGNOSES   Dehydration Weakness   Note:  This document was prepared using Dragon voice recognition software and may include unintentional dictation errors.   Minna Antis, MD 07/10/22 2225

## 2022-07-21 ENCOUNTER — Other Ambulatory Visit: Payer: Self-pay

## 2022-07-21 ENCOUNTER — Emergency Department: Payer: HMO

## 2022-07-21 ENCOUNTER — Emergency Department
Admission: EM | Admit: 2022-07-21 | Discharge: 2022-07-21 | Disposition: A | Discharge status: Home / Self Care | Payer: HMO | Attending: Emergency Medicine | Admitting: Emergency Medicine

## 2022-07-21 DIAGNOSIS — R519 Headache, unspecified: Secondary | ICD-10-CM | POA: Diagnosis present

## 2022-07-21 DIAGNOSIS — E119 Type 2 diabetes mellitus without complications: Secondary | ICD-10-CM | POA: Diagnosis not present

## 2022-07-21 DIAGNOSIS — I10 Essential (primary) hypertension: Secondary | ICD-10-CM | POA: Diagnosis not present

## 2022-07-21 DIAGNOSIS — R0602 Shortness of breath: Secondary | ICD-10-CM | POA: Insufficient documentation

## 2022-07-21 DIAGNOSIS — Z1152 Encounter for screening for COVID-19: Secondary | ICD-10-CM | POA: Insufficient documentation

## 2022-07-21 DIAGNOSIS — Z853 Personal history of malignant neoplasm of breast: Secondary | ICD-10-CM | POA: Diagnosis not present

## 2022-07-21 DIAGNOSIS — R197 Diarrhea, unspecified: Secondary | ICD-10-CM | POA: Insufficient documentation

## 2022-07-21 DIAGNOSIS — Z85818 Personal history of malignant neoplasm of other sites of lip, oral cavity, and pharynx: Secondary | ICD-10-CM | POA: Insufficient documentation

## 2022-07-21 LAB — URINALYSIS, ROUTINE W REFLEX MICROSCOPIC
Bilirubin Urine: NEGATIVE
Glucose, UA: NEGATIVE mg/dL
Hgb urine dipstick: NEGATIVE
Ketones, ur: 5 mg/dL — AB
Nitrite: NEGATIVE
Protein, ur: 30 mg/dL — AB
Specific Gravity, Urine: 1.028 (ref 1.005–1.030)
pH: 5 (ref 5.0–8.0)

## 2022-07-21 LAB — BASIC METABOLIC PANEL
Anion gap: 8 (ref 5–15)
BUN: 29 mg/dL — ABNORMAL HIGH (ref 8–23)
CO2: 26 mmol/L (ref 22–32)
Calcium: 9.7 mg/dL (ref 8.9–10.3)
Chloride: 99 mmol/L (ref 98–111)
Creatinine, Ser: 1.15 mg/dL — ABNORMAL HIGH (ref 0.44–1.00)
GFR, Estimated: 50 mL/min — ABNORMAL LOW (ref >60)
Glucose, Bld: 179 mg/dL — ABNORMAL HIGH (ref 70–99)
Potassium: 3.7 mmol/L (ref 3.5–5.1)
Sodium: 133 mmol/L — ABNORMAL LOW (ref 135–145)

## 2022-07-21 LAB — RESP PANEL BY RT-PCR (RSV, FLU A&B, COVID)  RVPGX2
Influenza A by PCR: NEGATIVE
Influenza B by PCR: NEGATIVE
Resp Syncytial Virus by PCR: NEGATIVE
SARS Coronavirus 2 by RT PCR: NEGATIVE

## 2022-07-21 LAB — TROPONIN I (HIGH SENSITIVITY): Troponin I (High Sensitivity): 16 ng/L (ref <18)

## 2022-07-21 LAB — CBC
HCT: 35.4 % — ABNORMAL LOW (ref 36.0–46.0)
Hemoglobin: 10.6 g/dL — ABNORMAL LOW (ref 12.0–15.0)
MCH: 21.9 pg — ABNORMAL LOW (ref 26.0–34.0)
MCHC: 29.9 g/dL — ABNORMAL LOW (ref 30.0–36.0)
MCV: 73 fL — ABNORMAL LOW (ref 80.0–100.0)
Platelets: 389 10*3/uL (ref 150–400)
RBC: 4.85 MIL/uL (ref 3.87–5.11)
RDW: 19.3 % — ABNORMAL HIGH (ref 11.5–15.5)
WBC: 6.7 10*3/uL (ref 4.0–10.5)
nRBC: 0 % (ref 0.0–0.2)

## 2022-07-21 LAB — BRAIN NATRIURETIC PEPTIDE: B Natriuretic Peptide: 38 pg/mL (ref 0.0–100.0)

## 2022-07-21 MED ORDER — METOCLOPRAMIDE HCL 5 MG/ML IJ SOLN
10.0000 mg | Freq: Once | INTRAMUSCULAR | Status: AC
  Administered 2022-07-21: 10 mg via INTRAVENOUS
  Filled 2022-07-21: qty 2

## 2022-07-21 MED ORDER — ACETAMINOPHEN 500 MG PO TABS
1000.0000 mg | ORAL_TABLET | Freq: Once | ORAL | Status: AC
  Administered 2022-07-21: 1000 mg via ORAL
  Filled 2022-07-21: qty 2

## 2022-07-21 MED ORDER — LACTATED RINGERS IV BOLUS
1000.0000 mL | Freq: Once | INTRAVENOUS | Status: AC
  Administered 2022-07-21: 1000 mL via INTRAVENOUS

## 2022-07-21 NOTE — Discharge Instructions (Addendum)
It is very important that you follow-up with your primary care doctor as I am concerned you could have C. difficile diarrhea.  You will need to provide a stool sample to test for the C dif.  In the interim make sure you are taking enough fluids and stick to bland solids and avoid fatty or greasy foods.  If you develop abdominal pain or not able to keep fluids down or your shortness of breath is worsening then please return the emergency department.

## 2022-07-21 NOTE — ED Notes (Signed)
Pulse ox while ambulating 92%

## 2022-07-21 NOTE — ED Provider Notes (Signed)
[facility] Provider Note    Event Date/Time   First MD Initiated Contact with Patient 07/21/22 1102     (approximate)   History   Headache   HPI  Suzanne Strong is a 77 y.o. female  with pmh DM, HTN, HLD, tonsillar cancer who p/w diarrhea and vomiting.  Patient tells me that she started having diarrhea about 2 weeks ago.  It is pure liquid nonbloody.  She came to the ER last week and was given bolus of fluid and told she was likely dehydrated felt improved after fluids.  She continued to have diarrhea and started feeling bad again last week.  She feels very weak and fatigued.  She is still having diarrhea multiple episodes per day about 4-5.  She thinks she is not urinating much other than with the episodes of diarrhea.  Starting yesterday patient is also feeling more short of breath having dyspnea on exertion.  Denies any chest pain.  Denies fevers or chills.  She has chronic cough unchanged.  Started having headache last night as well.  It was not maximal in onset was gradual in onset is posterior and bitemporal.  No associated double vision numbness tingling or focal weakness.    Patient was seen by pulmonology at the beginning of last month due to recurrent aspiration pneumonia.  She had a bronchoscopy on 5/10 and was prescribed 7 days of Levaquin.  She developed leg pain after 3 to 4 days of Levaquin so was switched to a different antibiotic which she took twice a day for an unknown duration.  Thinks it was doxycycline.   Past Medical History:  Diagnosis Date   Anemia    iron deficiency and B12 deficiency   Anxiety    Breast cancer (HCC) 02/25/2015   upper-outer quadrant of left female breast, Triple Negative. Neo-adjuvant chemo with complete pathologic response.    Cervical dysplasia    conization   Diabetes mellitus without complication (HCC)    Diverticulosis    Dyspnea    Fever 04/11/2015   Gastroparesis    related to previous radiation tx    GERD (gastroesophageal reflux disease)    Hyperlipidemia    Hypertension    Neuromuscular disorder (HCC)    Neuropathy    legs/feet, S/P chemo meds   Personal history of chemotherapy    Personal history of radiation therapy 2017   LEFT lumpectomy w/ radiation   Pneumonia    RLS (restless legs syndrome)    Shingles    Hx of   Sleep apnea    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HCC) 2006   Wears dentures    partial bottom    Patient Active Problem List   Diagnosis Date Noted   Lymphadenopathy 06/24/2022   Poor balance 04/28/2022   Vision changes 02/12/2022   Polypharmacy 02/12/2022   Plant dermatitis 10/21/2021   Nausea & vomiting 04/23/2021   At high risk for aspiration 04/23/2021   COVID-19 03/18/2021   Recurrent aspiration pneumonia (HCC) 12/09/2020   Dysuria 07/17/2020   Dysphagia    Gastric erythema    Gastric polyp    Encounter for screening colonoscopy    Polyp of colon    History of aspiration pneumonia 02/15/2020   Diabetes mellitus without complication (HCC)    GERD (gastroesophageal reflux disease)    Hypertension    Urinary urgency 01/05/2020   Grief reaction 09/18/2019   Swallowing dysfunction 07/18/2019   Persistent cough for 3 weeks or longer 02/21/2019  Fever, intermittent 09/01/2018   Pre-syncope 02/07/2018   Orthostatic hypotension 01/26/2018   Episodic weakness 01/26/2018   Medicare annual wellness visit, subsequent 06/29/2017   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   Preoperative examination 04/27/2017   Carpal tunnel syndrome of right wrist 11/13/2016   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HCC) History of  02/17/2016   Obstructive sleep apnea 01/01/2016   Carcinoma of upper-outer quadrant of left breast in female, estrogen receptor negative (HCC) 11/21/2015   Hypomagnesemia 08/28/2015   Fever 04/11/2015   Initial Medicare annual wellness visit 10/16/2014   Estrogen deficiency 10/16/2014   Colon cancer screening 10/16/2014   Controlled type 2 diabetes mellitus  without complication (HCC) 03/28/2014   RLS (restless legs syndrome) 02/21/2014   Chronic neck pain 07/25/2010   Depression with anxiety 07/07/2010   Routine general medical examination at a health care facility 06/26/2010   B12 deficiency 09/04/2009   Anemia, iron deficiency 08/29/2009   Anxiety state 08/26/2009   GASTROPARESIS 08/26/2009   Vitamin D deficiency 07/19/2008   Disorder of bone and cartilage 07/19/2007   Hyperlipidemia 07/18/2007   EDEMA 07/18/2007     Physical Exam  Triage Vital Signs: ED Triage Vitals  Enc Vitals Group     BP 07/21/22 1032 138/83     Pulse Rate 07/21/22 1032 94     Resp 07/21/22 1032 18     Temp 07/21/22 1032 98 F (36.7 C)     Temp src --      SpO2 07/21/22 1032 98 %     Weight --      Height --      Head Circumference --      Peak Flow --      Pain Score 07/21/22 1030 5     Pain Loc --      Pain Edu? --      Excl. in GC? --     Most recent vital signs: Vitals:   07/21/22 1403 07/21/22 1404  BP: (!) 144/57   Pulse: 95   Resp: (!) 22   Temp:    SpO2: 100% 92%     General: Awake, no distress.  CV:  Good peripheral perfusion.  Resp:  Normal effort.  Sounds are clear Abd:  No distention. Soft, nontender  Neuro:             Awake, Alert, Oriented x 3  Other:  Aox3, nml speech  PERRL, EOMI, face symmetric, nml tongue movement  5/5 strength in the BL upper and lower extremities  Sensation grossly intact in the BL upper and lower extremities  Finger-nose-finger intact BL    ED Results / Procedures / Treatments  Labs (all labs ordered are listed, but only abnormal results are displayed) Labs Reviewed  BASIC METABOLIC PANEL - Abnormal; Notable for the following components:      Result Value   Sodium 133 (*)    Glucose, Bld 179 (*)    BUN 29 (*)    Creatinine, Ser 1.15 (*)    GFR, Estimated 50 (*)    All other components within normal limits  CBC - Abnormal; Notable for the following components:   Hemoglobin 10.6 (*)     HCT 35.4 (*)    MCV 73.0 (*)    MCH 21.9 (*)    MCHC 29.9 (*)    RDW 19.3 (*)    All other components within normal limits  URINALYSIS, ROUTINE W REFLEX MICROSCOPIC - Abnormal; Notable for the following components:   Color,  Urine AMBER (*)    APPearance HAZY (*)    Ketones, ur 5 (*)    Protein, ur 30 (*)    Leukocytes,Ua SMALL (*)    Bacteria, UA RARE (*)    All other components within normal limits  RESP PANEL BY RT-PCR (RSV, FLU A&B, COVID)  RVPGX2  C DIFFICILE QUICK SCREEN W PCR REFLEX    GASTROINTESTINAL PANEL BY PCR, STOOL (REPLACES STOOL CULTURE)  BRAIN NATRIURETIC PEPTIDE  TROPONIN I (HIGH SENSITIVITY)     EKG  EKG interpretation performed by myself: NSR, nml axis, nml intervals, no acute ischemic changes    RADIOLOGY I reviewed and interpreted patient's chest x-ray which shows interstitial prominence   PROCEDURES:  Critical Care performed: No  Procedures  The patient is on the cardiac monitor to evaluate for evidence of arrhythmia and/or significant heart rate changes.   MEDICATIONS ORDERED IN ED: Medications  lactated ringers bolus 1,000 mL (1,000 mLs Intravenous New Bag/Given 07/21/22 1138)  metoCLOPramide (REGLAN) injection 10 mg (10 mg Intravenous Given 07/21/22 1138)  acetaminophen (TYLENOL) tablet 1,000 mg (1,000 mg Oral Given 07/21/22 1138)     IMPRESSION / MDM / ASSESSMENT AND PLAN / ED COURSE  I reviewed the triage vital signs and the nursing notes.                              Patient's presentation is most consistent with acute complicated illness / injury requiring diagnostic workup.  Differential diagnosis includes, but is not limited to, infectious diarrhea, C. difficile, dehydration, AKI, migraine, viral illness, less likely intracranial hemorrhage  Patient is a 77 year old female with past medical history of tonsillar cancer and chronic cough with recurrent aspiration pneumonias, diabetes who presents with several symptoms.  She has had  about 2 weeks of diarrhea since taking antibiotics for pneumonia.  She is feeling weak fatigued and somewhat more short of breath than normal over the last day.  Has also developed headache over the last day or so.  She is having about 4-5 episodes of nonbloody diarrhea per day no vomiting.  Has had decreased p.o. intake.  No fevers or abdominal pain no blood in her stool.  She has chronic cough and is being seen by pulmonology for chronic aspiration pneumonia does feel more dyspneic over the last day or so.  Denies chest pain.  She is also having a headache since last night that it was gradual in onset not maximal in onset without associated neurologic symptoms.  Patient is mildly hypertensive but vitals otherwise reassuring.  She is saturating 100% on room air.  Overall she looks somewhat fatigued but nontoxic.  Mucous membranes are somewhat dry.  Her abdominal exam is benign lung sounds are clear she is no increased work of breathing and her neurologic exam is nonfocal.  Given patient was recently on antibiotics DC she took several days of Levaquin I am concerned for possible C. difficile as a cause of her diarrhea.  I did ask patient to give a stool sample if she is able to.  With her dyspnea will obtain chest x-ray as well as some basic labs including a troponin and BNP and EKG.  Will check CBC and CMP to ensure her renal function has not worsened given the ongoing diarrhea and check for electrolyte abnormalities.  Will give a bolus of fluid Reglan and Tylenol for headache.  Given patient's normal neurologic exam nontoxic appearance and that headache was  not maximal in onset was gradual in onset my suspicion for subarachnoid or intracranial hemorrhage is low.  Suspect her headache could be related to dehydration.  Patient's labs overall reassuring.  Renal function is improved compared to last ED visit.  Stable anemia, no leukocytosis.  Troponin and BNP are negative.  Urinalysis has 11-20 white cells but  patient has no urinary symptoms do not feel this represents UTI.  Radiology read patient's chest x-ray is increased interstitial changes bilaterally.  Patient is not hypoxic and she has no leukocytosis.  There is certainly no new dense infiltrate on chest x-ray.  I suspect that this is a result of chronic aspiration.  On reassessment after a bolus of fluid Tylenol and Reglan patient feels much improved.  We did ambulate her and she did get somewhat short of breath but sats were above 92% and she tells me that this has been how she has been for the last several weeks.  Patient was able to tolerate p.o.  Unfortunately she was not able to provide a stool sample in the ED.  I did discuss with her the portance of follow-up as I think it is very possible that she does have C. difficile.  I recommended that she follow-up with her primary care doctor as she could arrange a time to bring a stool sample to them.  Otherwise given patient is feeling improved with stable vital signs and reassuring workup with no acute hypoxia I do think that she is appropriate for outpatient follow-up with pulmonology and her primary doctor.  Clinical Course as of 07/21/22 1503  Tue Jul 21, 2022  1230 WBC, UA: 11-20 [KM]    Clinical Course User Index [KM] Georga Hacking, MD     FINAL CLINICAL IMPRESSION(S) / ED DIAGNOSES   Final diagnoses:  Diarrhea, unspecified type     Rx / DC Orders   ED Discharge Orders     None        Note:  This document was prepared using Dragon voice recognition software and may include unintentional dictation errors.   Georga Hacking, MD 07/21/22 [id number]

## 2022-07-21 NOTE — ED Triage Notes (Addendum)
Pt comes with c/o headache,, diarrhea and vomiting. Pt states this all started few days ago. Pt states now having some sob. Pt states she did have fluids thinking she was dehydrated. Pt was good for few days but then all this started.   Pt has some labored breathing noted and appears a little pale. Pt states more sob when walking. Pt states no relief with inhaler.

## 2022-07-22 ENCOUNTER — Telehealth: Payer: Self-pay | Admitting: *Deleted

## 2022-07-22 ENCOUNTER — Telehealth: Payer: Self-pay

## 2022-07-22 DIAGNOSIS — R197 Diarrhea, unspecified: Secondary | ICD-10-CM | POA: Insufficient documentation

## 2022-07-22 NOTE — Telephone Encounter (Signed)
Per Tresa Endo, the patient called back and was scheduled to see Tammy on 07/31/2022.  Nothing further needed.

## 2022-07-22 NOTE — Telephone Encounter (Signed)
ER orders were cancelled, pt will stop by the office to pick up stool studies supplies, please reorder them

## 2022-07-22 NOTE — Telephone Encounter (Signed)
This pt was unable to give a stool sample in ER for c diff but needs a test as soon as possible  Looks like they ordered it but unsure if she was given collection materials to take home or not  Would someone please check in with her  I can re order if needed  Thanks

## 2022-07-22 NOTE — Telephone Encounter (Signed)
I put future orders in for c diff and stool pcr  Thanks

## 2022-07-22 NOTE — Telephone Encounter (Signed)
[facility] ED faxed a referral for the patient to schedule her a follow up appt in one week (seen on 07/21/2022).   ATC the patient. LVM for the patient to return my call. I have held slot on Tammy Parrett's schedule on 6/14 at 1:30pm for the patient.

## 2022-07-27 ENCOUNTER — Other Ambulatory Visit: Payer: Self-pay | Admitting: Radiology

## 2022-07-27 DIAGNOSIS — R197 Diarrhea, unspecified: Secondary | ICD-10-CM

## 2022-07-27 LAB — FUNGAL ORGANISM REFLEX

## 2022-07-27 LAB — FUNGUS CULTURE WITH STAIN

## 2022-07-27 LAB — FUNGUS CULTURE RESULT

## 2022-07-28 LAB — C. DIFFICILE GDH AND TOXIN A/B
GDH ANTIGEN: NOT DETECTED
MICRO NUMBER:: 15063222
SPECIMEN QUALITY:: ADEQUATE
TOXIN A AND B: NOT DETECTED

## 2022-07-29 ENCOUNTER — Telehealth: Payer: Self-pay | Admitting: Student in an Organized Health Care Education/Training Program

## 2022-07-29 LAB — GI PROFILE, STOOL, PCR

## 2022-07-29 LAB — SPECIMEN STATUS REPORT

## 2022-07-29 NOTE — Progress Notes (Signed)
Suzanne Strong T. Suzanne Frisinger, MD, CAQ Sports Medicine [facility] at [facility] [address] [place], [address]  Phone: [phone]  FAX: [phone]  [name] - 77 y.o. female  MRN [id number]  Date of Birth: 28-Mar-1945  Date: 07/30/2022  PCP: Judy Pimple, MD  Referral: Judy Pimple, MD  Chief Complaint  Patient presents with   [name] working in yard Apri/May L[employer]y Seat backward last week in kitchen and hit her head   Leg Pain    Left   Knee Pain    Left   Subjective:   Suzanne Strong is a 77 y.o. very pleasant female patient with Body mass index is 33.43 kg/m. who presents with the following:  Patient presents with ongoing left-sided leg pain after a fall.  2 years ago, started having some different spells, but no docs found out what was wrong per report.  Legs felt like they went to jelly.  This has been an occasional problem for years.  Recently, since December falls have become without warning.   Leg has been hurting for about 2 years.  Knee pain, has had some corticosteroid injections from Orthopedics.  Saw Ortho about 2 weeks ago.  She was noted to have advanced arthropathy, per her report.  I do not have the films for my independent review.  I am able to see the HPI from April 2024 through care everywhere.    Here to talk to me about this now.  She does have pain in the medial and lateral joint line, deep diffuse pain at times, and she also has had some symptomatic giving way.  Inject left knee  Review of Systems is noted in the HPI, as appropriate  Objective:   BP 110/80 (BP Location: Left Arm, Patient Position: Sitting, Cuff Size: Large)   Pulse 74   Temp 98 F (36.7 C) (Temporal)   Ht 5' 6.5" (1.689 m)   Wt 210 lb 4 oz (95.4 kg)   SpO2 98%   BMI 33.43 kg/m   GEN: No acute distress; alert,appropriate. PULM: Breathing comfortably in no respiratory distress PSYCH: Normally interactive.   Left knee: She lacks  2 degrees of extension.  Flexion to 107.  Restricted motion at the patella with some pain at the patellar facets.  She does have medial lateral joint line tenderness is notably moderate to severe. No significant tenderness at the pes anserine bursa Stable to varus and valgus stress ACL and PCL are intact Any kind of forced flexion causes pain.  Laboratory and Imaging Data:  Assessment and Plan:     ICD-10-CM   1. Primary osteoarthritis of left knee  M17.12 triamcinolone acetonide (KENALOG-40) injection 40 mg     Acute on chronic left-sided knee osteoarthritis with exacerbation.  Somewhat unsteady and has had some recent falls.  Things reasonable at this point to try to calm down her knee with an intra-articular injection.  It looks as if she has not had 1 since January 2024.  Aspiration/Injection Procedure Note Suzanne Strong 05/01/1945 Date of procedure: 07/30/2022  Procedure: Large Joint Aspiration / Injection of Knee, L Indications: Pain  Procedure Details Patient verbally consented to procedure. Risks, benefits, and alternatives explained. Sterilely prepped with Chloraprep. Ethyl cholride used for anesthesia. 9 cc Lidocaine 1% mixed with 1 mL of Kenalog 40 mg injected using the anteromedial approach without difficulty. No complications with procedure and tolerated well. Patient had decreased pain post-injection. Medication: 1  mL of Kenalog 40 mg   Medication Management during today's office visit: Meds ordered this encounter  Medications   triamcinolone acetonide (KENALOG-40) injection 40 mg   Medications Discontinued During This Encounter  Medication Reason   OZEMPIC, 0.25 OR 0.5 MG/DOSE, 2 MG/3ML SOPN Cost of medication   benzonatate (TESSALON) 200 MG capsule Completed Course   Disposition: No follow-ups on file.  Dr[employer] One speech-to-text software was used for transcription in this dictation.  Possible transcriptional errors can occur using Animal nutritionist.    Signed,  Elpidio Galea. Phoenix Dresser, MD   Outpatient Encounter Medications as of 07/30/2022  Medication Sig   acetaminophen (TYLENOL) 325 MG tablet Take 2 tablets (650 mg total) by mouth every 6 (six) hours as needed for mild pain (or Fever >/= 101).   albuterol (PROVENTIL) (2.5 MG/3ML) 0.083% nebulizer solution Take 3 mLs (2.5 mg total) by nebulization in the morning and at bedtime.   albuterol (VENTOLIN HFA) 108 (90 Base) MCG/ACT inhaler Inhale 2 puffs into the lungs every 6 (six) hours as needed for wheezing or shortness of breath.   amitriptyline (ELAVIL) 50 MG tablet Take 1 tablet (50 mg total) by mouth at bedtime.   amLODipine (NORVASC) 5 MG tablet Take 1 tablet (5 mg total) by mouth daily.   atorvastatin (LIPITOR) 10 MG tablet TAKE ONE TABLET BY MOUTH ONCE DAILY   b complex vitamins tablet Take 1 tablet by mouth daily.   BIOTIN PO Take by mouth.   busPIRone (BUSPAR) 15 MG tablet Take 1.5 tabs po BID   chlorpheniramine-HYDROcodone (TUSSIONEX) 10-8 MG/5ML Take 5 mLs by mouth every 12 (twelve) hours as needed for cough. Caution of sedation   Cholecalciferol (VITAMIN D-1000 MAX ST) 25 MCG (1000 UT) tablet Take 2,000 Units by mouth daily. Takes three 50 mcg tablets, 3 capsules daily   Emollient (COLLAGEN EX)    famotidine (PEPCID) 20 MG tablet Take 1 tablet (20 mg total) by mouth 2 (two) times daily.   ferrous sulfate 325 (65 FE) MG tablet Take 1 tablet (325 mg total) by mouth 2 (two) times daily with a meal.   glipiZIDE (GLUCOTROL) 5 MG tablet Take 2 tablets (10 mg total) by mouth daily before breakfast.   glucose blood (ONETOUCH ULTRA) test strip USE TO check blood glucose ONCE DAILY AND AS NEEDED FOR DIABETES   meloxicam (MOBIC) 15 MG tablet Take 1 tablet by mouth daily as needed for pain.   metFORMIN (GLUCOPHAGE) 500 MG tablet Take 2 tablets (1,000 mg total) by mouth 2 (two) times daily with a meal. (Patient taking differently: Take 2,000 mg by mouth daily with breakfast.)   Multiple  Vitamins-Minerals (WOMENS MULTIVITAMIN PO) Take by mouth.   omeprazole (PRILOSEC) 20 MG capsule TAKE ONE CAPSULE BY MOUTH EVERYDAY AT BEDTIME   ondansetron (ZOFRAN) 4 MG tablet Take 1 tablet (4 mg total) by mouth every 8 (eight) hours as needed for nausea or vomiting.   pregabalin (LYRICA) 150 MG capsule Take 300 mg by mouth at bedtime.   rOPINIRole (REQUIP) 1 MG tablet TAKE 1/2 TABLET BY MOUTH EVERY MORNING and TAKE THREE TABLETS BY MOUTH EVERYDAY AT BEDTIME   sertraline (ZOLOFT) 100 MG tablet Take 2 tablets (200 mg total) by mouth every morning.   sodium chloride HYPERTONIC 3 % nebulizer solution Take by nebulization as needed for other.   temazepam (RESTORIL) 15 MG capsule Take 1 capsule (15 mg total) by mouth at bedtime as needed for sleep.   zinc gluconate 50 MG tablet Take  50 mg by mouth daily.   ALPRAZolam (XANAX) 0.5 MG tablet Take 1 tablet (0.5 mg total) by mouth 2 (two) times daily as needed for anxiety.   [DISCONTINUED] benzonatate (TESSALON) 200 MG capsule Take 1 capsule (200 mg total) by mouth 3 (three) times daily as needed for cough. Swallow whole   [DISCONTINUED] OZEMPIC, 0.25 OR 0.5 MG/DOSE, 2 MG/3ML SOPN Inject into the skin.   Facility-Administered Encounter Medications as of 07/30/2022  Medication   influenza vaccine adjuvanted (FLUAD) injection 0.5 mL   sodium chloride flush (NS) 0.9 % injection 10 mL   [COMPLETED] triamcinolone acetonide (KENALOG-40) injection 40 mg

## 2022-07-29 NOTE — Telephone Encounter (Signed)
Pt states she is returning a missed call .

## 2022-07-29 NOTE — Telephone Encounter (Signed)
Candida is a very common colonizer of the mouth and airways. I would ignore this result.   Patient did not view the mychart comment. Please call with this result. Thank you.    Called and spoke with patient, advised of results/recommendations per Dr. Aundria Rud.  She verbalized understanding.  She has a f/u with Rubye Oaks NP on 07/31/2022.  Nothing further needed.

## 2022-07-30 ENCOUNTER — Encounter: Payer: Self-pay | Admitting: Family Medicine

## 2022-07-30 ENCOUNTER — Ambulatory Visit (INDEPENDENT_AMBULATORY_CARE_PROVIDER_SITE_OTHER): Payer: HMO | Admitting: Family Medicine

## 2022-07-30 VITALS — BP 110/80 | HR 74 | Temp 98.0°F | Ht 66.5 in | Wt 210.2 lb

## 2022-07-30 DIAGNOSIS — M1712 Unilateral primary osteoarthritis, left knee: Secondary | ICD-10-CM | POA: Diagnosis not present

## 2022-07-30 LAB — FUNGAL ORGANISM REFLEX

## 2022-07-30 LAB — FUNGUS CULTURE RESULT

## 2022-07-30 MED ORDER — TRIAMCINOLONE ACETONIDE 40 MG/ML IJ SUSP
40.0000 mg | Freq: Once | INTRAMUSCULAR | Status: AC
Start: 2022-07-30 — End: 2022-07-30
  Administered 2022-07-30: 40 mg via INTRA_ARTICULAR

## 2022-07-31 ENCOUNTER — Telehealth: Payer: Self-pay

## 2022-07-31 ENCOUNTER — Encounter: Payer: Self-pay | Admitting: Adult Health

## 2022-07-31 ENCOUNTER — Ambulatory Visit (INDEPENDENT_AMBULATORY_CARE_PROVIDER_SITE_OTHER): Payer: HMO | Admitting: Adult Health

## 2022-07-31 VITALS — BP 120/70 | HR 77 | Temp 97.3°F | Ht 66.5 in | Wt 207.6 lb

## 2022-07-31 DIAGNOSIS — J690 Pneumonitis due to inhalation of food and vomit: Secondary | ICD-10-CM

## 2022-07-31 DIAGNOSIS — R053 Chronic cough: Secondary | ICD-10-CM | POA: Diagnosis not present

## 2022-07-31 DIAGNOSIS — R1312 Dysphagia, oropharyngeal phase: Secondary | ICD-10-CM | POA: Diagnosis not present

## 2022-07-31 DIAGNOSIS — J479 Bronchiectasis, uncomplicated: Secondary | ICD-10-CM | POA: Insufficient documentation

## 2022-07-31 DIAGNOSIS — K219 Gastro-esophageal reflux disease without esophagitis: Secondary | ICD-10-CM | POA: Diagnosis not present

## 2022-07-31 MED ORDER — HYPERSAL 3.5 % IN NEBU: 1.0000 | INHALATION_SOLUTION | Freq: Two times a day (BID) | RESPIRATORY_TRACT | 5 refills | Status: AC | PRN

## 2022-07-31 MED ORDER — SODIUM CHLORIDE 3 % IN NEBU
INHALATION_SOLUTION | RESPIRATORY_TRACT | 12 refills | Status: DC | PRN
Start: 2022-07-31 — End: 2022-07-31

## 2022-07-31 NOTE — Patient Instructions (Addendum)
Begin Saline neb Twice daily  followed by Albuterol Neb Twice daily  and then followed by Flutter valve Twice daily  .  Order for [employer] and tubing.  Aspiration precautions.  Delsym 2 tsp Twice daily  As needed  for severe cough .  Use extreme caution with sedating medications.  Discuss with Psychiatry regarding your medication .  Activity as tolerated.  Continue on Prilosec and Pepcid .  Follow up with Dr. Aundria Rud in 2 months and As needed

## 2022-07-31 NOTE — Assessment & Plan Note (Signed)
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  Cultures positive for Serratia.  Negative for AFB .  Had scant Candida.  Has completed full course of antibiotics.  Clinically feels better.  Long discussion with her regarding sedating medications potential complications.,  Aspiration precautions.  Plan  Patient Instructions  Begin Saline neb Twice daily  followed by Albuterol Neb Twice daily  and then followed by Flutter valve Twice daily  .  Order for [employer] and tubing.  Aspiration precautions.  Delsym 2 tsp Twice daily  As needed  for severe cough .  Use extreme caution with sedating medications.  Discuss with Psychiatry regarding your medication .  Activity as tolerated.  Continue on Prilosec and Pepcid .  Follow up with Dr. Aundria Rud in 2 months and As needed

## 2022-07-31 NOTE — Telephone Encounter (Addendum)
[name], MD  Bonney Leitz, CMA Cc: Julio Sicks, NP Hi, yes. Hypertonic saline 4 mL twice daily followed by albuterol nebulized followed by the acapella device.       Previous Messages    ----- Message ----- From: Julio Sicks, NP Sent: 07/31/2022   2:50 PM EDT To: [name], MD; Bonney Leitz, CMA  Hey saw this lady today in clinic. She did not get the hypertonic neb or machine from last visit , pharmacy and dme issue. Did you want the hypertonic 3% neb 15ml Twice daily, that is what was written last visit. I am not familiar with that typically use the 4ml vials in past.  Just let me know I can resend rx to pharm. We ordered the neb machine again.  Tammy

## 2022-07-31 NOTE — Telephone Encounter (Signed)
Per Tammy,  she would like to change the originally prescription to Sodium Chloride (HYPERSAL) 3.5 % Take 1 each by nebulization 2 (two) times daily as needed. And she has sent in the prescription to [facility].  When I called the pharmacy to cancel the first prescription they said the patient has already picked it up.   Per Tammy, the patient should only use 1/3 of the 15ml vial and then discard the remainder. Once she runs out of the 15ml vials she can pick up the other prescription.    ATC the patient. LVM for the patient to return my call.

## 2022-07-31 NOTE — Assessment & Plan Note (Signed)
Continue on reflux medications

## 2022-07-31 NOTE — Assessment & Plan Note (Signed)
Continue with aspiration precautions.  Use caution with sedating medications

## 2022-07-31 NOTE — Assessment & Plan Note (Signed)
Left lower lobe bronchiectatic changes secondary to recurrent aspiration.  In the setting of chronic dysphagia from tonsillar cancer status post resection and radiation- Patient needs to increase her mucociliary clearance regimen.  She was to begin albuterol and hypertonic nebs along with flutter valve.  Unfortunately neb machine was not delivered.  We have sent an order to DME again to have this set up.  Patient will begin hypertonic nebs twice daily followed by albuterol nebulizer and then followed by flutter valve.

## 2022-07-31 NOTE — Addendum Note (Signed)
Addended by: Julio Sicks on: 07/31/2022 04:20 PM   Modules accepted: Orders

## 2022-07-31 NOTE — Progress Notes (Signed)
@Patient  ID: Suzanne Strong, female    DOB: [birth year], 77 y.o.   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  Patient was seen last visit for pulmonary consult for recurrent aspiration pneumonia in the setting of known dysphagia with positive aspiration on modified barium swallow.  Patient has been recommended for G-tube placement but declines.  She is also on multiple sedating medications for depression.  Patient was recommended to begin saline nebs twice daily followed by albuterol nebulizers twice daily.  And flutter valve.  She also underwent a bronchoscopy with EBUS (subcarinal lymphadenopathy) with BAL  Fungal culture showed scant Candida albicans.  BAL was positive for Serratia Marcescens .  Pansensitive.  Patient was treated with Levaquin for 7 days.  She was unable to tolerate Levaquin and was treated with a 7-day course of Bactrim DS. She was recommended on strict aspiration precautions.  And advised to decrease sedating medications.  Cytology was negative for malignant cells.  Showed benign bronchial epithelial cells.  Flow cytology was negative.  Patient remains on temazepam, Requip, Lyrica, Zofran, BuSpar,  Elavil, Xanax. She is not using nebulizer medication as she does not have a neb machine. We ordered a neb machine today .  Using flutter valve Twice daily  .   She is feeling better with decreased cough and congestion .   Recent seen in ER for gastroenteritis with Diarrhea and vomiting. Treated with IVF. Feeling much better now.   Lives at home alone, widower. Drives. Works Community education officer at [employer].    Allergies  Allergen Reactions   Amoxicillin Swelling    REACTION: rash, swelling Has patient had a PCN reaction causing immediate rash, facial/tongue/throat swelling, SOB or lightheadedness with hypotension: yes Has patient had a PCN reaction causing severe rash involving mucus membranes or skin necrosis: no Has patient had a PCN reaction that required hospitalization: no Has patient had a PCN reaction occurring within the last 10 years: yes If all of the above answers are "NO", then may proceed with Cephalosporin use.  Has tolerated ceftriaxone   Penicillin G Anaphylaxis    Has tolerated ceftriaxone on multiple occasions    Fluconazole Rash    REACTION: hives   Difuleron [Ferrous Fumarate-Dss]    Other Other (See Comments)    Pt has had tonsil cancer (on Left side) - pt may have difficulty swallowing    Immunization History  Administered Date(s) Administered   Fluad Quad(high Dose 65+) 11/26/2020   PFIZER(Purple Top)SARS-COV-2 Vaccination 03/30/2019, 04/20/2019, 11/11/2019   Pfizer Covid-19 Vaccine Bivalent Booster 20yrs & up 09/25/2020   Pneumococcal Conjugate-13 10/16/2014   Pneumococcal Polysaccharide-23 08/31/2012   Td 06/16/2001, 06/09/2022   Tdap 06/01/2012    Past Medical History:  Diagnosis Date   Anemia    iron deficiency and B12 deficiency   Anxiety    Breast cancer (HCC) 02/25/2015  upper-outer quadrant of left female breast, Triple Negative. Neo-adjuvant chemo with complete pathologic response.    Cervical dysplasia    conization   Diabetes mellitus without  complication (HCC)    Diverticulosis    Dyspnea    Fever 04/11/2015   Gastroparesis    related to previous radiation tx   GERD (gastroesophageal reflux disease)    Hyperlipidemia    Hypertension    Neuromuscular disorder (HCC)    Neuropathy    legs/feet, S/P chemo meds   Personal history of chemotherapy    Personal history of radiation therapy 2017   LEFT lumpectomy w/ radiation   Pneumonia    RLS (restless legs syndrome)    Shingles    Hx of   Sleep apnea    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HCC) 2006   Wears dentures    partial bottom    Tobacco History: Social History   Tobacco Use  Smoking Status Former   Packs/day: .5   Types: Cigarettes   Quit date: 1985   Years since quitting: 39.4  Smokeless Tobacco Never   Counseling given: Not Answered   Outpatient Medications Prior to Visit  Medication Sig Dispense Refill   acetaminophen (TYLENOL) 325 MG tablet Take 2 tablets (650 mg total) by mouth every 6 (six) hours as needed for mild pain (or Fever >/= 101).     albuterol (PROVENTIL) (2.5 MG/3ML) 0.083% nebulizer solution Take 3 mLs (2.5 mg total) by nebulization in the morning and at bedtime. 180 mL 11   albuterol (VENTOLIN HFA) 108 (90 Base) MCG/ACT inhaler Inhale 2 puffs into the lungs every 6 (six) hours as needed for wheezing or shortness of breath. 8 g 3   ALPRAZolam (XANAX) 0.5 MG tablet Take 1 tablet (0.5 mg total) by mouth 2 (two) times daily as needed for anxiety. 60 tablet 5   amitriptyline (ELAVIL) 50 MG tablet Take 1 tablet (50 mg total) by mouth at bedtime. 90 tablet 3   amLODipine (NORVASC) 5 MG tablet Take 1 tablet (5 mg total) by mouth daily. 90 tablet 3   atorvastatin (LIPITOR) 10 MG tablet TAKE ONE TABLET BY MOUTH ONCE DAILY 90 tablet 3   b complex vitamins tablet Take 1 tablet by mouth daily.     BIOTIN PO Take by mouth.     busPIRone (BUSPAR) 15 MG tablet Take 1.5 tabs po BID 270 tablet 1   chlorpheniramine-HYDROcodone (TUSSIONEX) 10-8 MG/5ML Take 5 mLs by mouth  every 12 (twelve) hours as needed for cough. Caution of sedation 50 mL 0   Cholecalciferol (VITAMIN D-1000 MAX ST) 25 MCG (1000 UT) tablet Take 2,000 Units by mouth daily. Takes three 50 mcg tablets, 3 capsules daily     Emollient (COLLAGEN EX)      famotidine (PEPCID) 20 MG tablet Take 1 tablet (20 mg total) by mouth 2 (two) times daily. 60 tablet 5   ferrous sulfate 325 (65 FE) MG tablet Take 1 tablet (325 mg total) by mouth 2 (two) times daily with a meal.     glipiZIDE (GLUCOTROL) 5 MG tablet Take 2 tablets (10 mg total) by mouth daily before breakfast.     glucose blood (ONETOUCH ULTRA) test strip USE TO check blood glucose ONCE DAILY AND AS NEEDED FOR DIABETES 100 strip 1   meloxicam (MOBIC) 15 MG tablet Take 1 tablet by mouth daily as needed for pain.     metFORMIN (GLUCOPHAGE) 500 MG tablet Take 2 tablets (1,000 mg total) by mouth 2 (two) times daily  with a meal. (Patient taking differently: Take 2,000 mg by mouth daily with breakfast.)     Multiple Vitamins-Minerals (WOMENS MULTIVITAMIN PO) Take by mouth.     omeprazole (PRILOSEC) 20 MG capsule TAKE ONE CAPSULE BY MOUTH EVERYDAY AT BEDTIME 90 capsule 3   ondansetron (ZOFRAN) 4 MG tablet Take 1 tablet (4 mg total) by mouth every 8 (eight) hours as needed for nausea or vomiting. 15 tablet 0   pregabalin (LYRICA) 150 MG capsule Take 300 mg by mouth at bedtime.     rOPINIRole (REQUIP) 1 MG tablet TAKE 1/2 TABLET BY MOUTH EVERY MORNING and TAKE THREE TABLETS BY MOUTH EVERYDAY AT BEDTIME 315 tablet 3   sertraline (ZOLOFT) 100 MG tablet Take 2 tablets (200 mg total) by mouth every morning. 180 tablet 1   sodium chloride HYPERTONIC 3 % nebulizer solution Take by nebulization as needed for other. 750 mL 12   temazepam (RESTORIL) 15 MG capsule Take 1 capsule (15 mg total) by mouth at bedtime as needed for sleep. 30 capsule 5   zinc gluconate 50 MG tablet Take 50 mg by mouth daily.     Facility-Administered Medications Prior to Visit  Medication  Dose Route Frequency Provider Last Rate Last Admin   influenza vaccine adjuvanted (FLUAD) injection 0.5 mL  0.5 mL Intramuscular Once Louretta Shorten R, MD       sodium chloride flush (NS) 0.9 % injection 10 mL  10 mL Intravenous PRN Earna Coder, MD   10 mL at 09/11/15 0845     Review of Systems:   Constitutional:   No  weight loss, night sweats,  Fevers, chills,  +fatigue, or  lassitude.  HEENT:   No headaches,  +Difficulty swallowing,  no Tooth/dental problems, or  Sore throat,                No sneezing, itching, ear ache, nasal congestion, post nasal drip,   CV:  No chest pain,  Orthopnea, PND, swelling in lower extremities, anasarca, dizziness, palpitations, syncope.   GI  No heartburn, indigestion, abdominal pain, nausea, vomiting, diarrhea, change in bowel habits, loss of appetite, bloody stools.   Resp:   No chest wall deformity  Skin: no rash or lesions.  GU: no dysuria, change in color of urine, no urgency or frequency.  No flank pain, no hematuria   MS:  No joint pain or swelling.  No decreased range of motion.  No back pain.    Physical Exam  BP 120/70 (BP Location: Left Arm, Cuff Size: Large)   Pulse 77   Temp (!) 97.3 F (36.3 C)   Ht 5' 6.5" (1.689 m)   Wt 207 lb 9.6 oz (94.2 kg)   SpO2 96%   BMI 33.01 kg/m   GEN: A/Ox3; pleasant , NAD   HEENT:  [place]/AT,   NOSE-clear, THROAT-clear, no lesions, no postnasal drip or exudate noted.   NECK:  Supple w/ fair ROM; no JVD; normal carotid impulses w/o bruits; no thyromegaly or nodules palpated; no lymphadenopathy.    RESP  few trace rhonchi . no accessory muscle use, no dullness to percussion  CARD:  RRR, no m/r/g, tr peripheral edema, pulses intact, no cyanosis or clubbing.  GI:   Soft & nt; nml bowel sounds; no organomegaly or masses detected.   Musco: Warm bil, no deformities or joint swelling noted.   Neuro: alert, no focal deficits noted.    Skin: Warm, no lesions or rashes    Lab  Results:  CBC      ProBNP No results found for: "PROBNP"  Imaging: DG Chest 2 View  Result Date: 07/21/2022 CLINICAL DATA:  Shortness of breath EXAM: CHEST - 2 VIEW COMPARISON:  07/10/2022 x-ray and older FINDINGS: Stable mild elevation of the left hemidiaphragm. No pneumothorax or effusion. Stable cardiopericardial silhouette with a calcified aorta. Surgical clips along the upper right hemithorax. Diffuse interstitial changes are slightly increased. The mild linear opacity left lung base is persistent. Favor scar or atelectasis. Degenerative changes seen along the spine on the lateral view. IMPRESSION: Mild elevation left hemidiaphragm with persistent left basilar scar or atelectasis. Diffuse interstitial changes. These appear slightly increased. Recommend follow-up Electronically Signed   By: Karen Kays M.D.   On: 07/21/2022 11:22   DG Chest 2 View  Result Date: 07/10/2022 CLINICAL DATA:  weakness and being treated for bacterial PNA EXAM: CHEST - 2 VIEW COMPARISON:  04/28/2022.  CT 05/22/2022 FINDINGS: Patchy opacities in the left lower lung as seen on prior CT could reflect residual infiltrate or atelectasis. No confluent opacity on the right. Heart and mediastinal contours are within normal limits. No effusions or acute bony abnormality. IMPRESSION: Patchy opacity in the left lower lobe could reflect residual infiltrate or atelectasis. Electronically Signed   By: Charlett Nose M.D.   On: 07/10/2022 18:33    triamcinolone acetonide (KENALOG-40) injection 40 mg     Date Action Dose Route User   07/30/2022 1134 Given 40 mg Intra-articular (Other) Damita Lack, CMA           No data to display          No results found for: "NITRICOXIDE"      Assessment & Plan:   Recurrent aspiration pneumonia (HCC) Recurrent aspiration pneumonia in the setting of chronic oropharyngeal dysphagia from previous tonsillar resection and radiation for cancer.-Recent bronchoscopy with cytology  negative for subcarinal adenopathy.  Cultures positive for Serratia.  Negative for AFB .  Had scant Candida.  Has completed full course of antibiotics.  Clinically feels better.  Long discussion with her regarding sedating medications potential complications.,  Aspiration precautions.  Plan  Patient Instructions  Begin Saline neb Twice daily  followed by Albuterol Neb Twice daily  and then followed by Flutter valve Twice daily  .  Order for [employer] and tubing.  Aspiration precautions.  Delsym 2 tsp Twice daily  As needed  for severe cough .  Use extreme caution with sedating medications.  Discuss with Psychiatry regarding your medication .  Activity as tolerated.  Continue on Prilosec and Pepcid .  Follow up with Dr. Aundria Rud in 2 months and As needed      Dysphagia Continue with aspiration precautions.  Use caution with sedating medications  GERD (gastroesophageal reflux disease) Continue on reflux medications  Bronchiectasis without complication (HCC) Left lower lobe bronchiectatic changes secondary to recurrent aspiration.  In the setting of chronic dysphagia from tonsillar cancer status post resection and radiation- Patient needs to increase her mucociliary clearance regimen.  She was to begin albuterol and hypertonic nebs along with flutter valve.  Unfortunately neb machine was not delivered.  We have sent an order to DME again to have this set up.  Patient will begin hypertonic nebs twice daily followed by albuterol nebulizer and then followed by flutter valve.     Rubye Oaks, NP 07/31/2022

## 2022-08-03 NOTE — Telephone Encounter (Signed)
Lm for patient. Will call once more due to nature of call.  

## 2022-08-03 NOTE — Telephone Encounter (Signed)
I have notified the patient. She said she reached out to [facility] about the nebulizer and they did not have the order.  Suzanne Strong can you check on this? Order was placed on 05/26/2022.

## 2022-08-03 NOTE — Telephone Encounter (Signed)
I spoke with Elease Hashimoto with [employer] today and on 07/31/22 while the patient was here in the office.  The patient did call [employer] but she was asking about albuteral machine not neb machine. Elease Hashimoto with [employer] stated she would try and call the patient again now

## 2022-08-04 DIAGNOSIS — J690 Pneumonitis due to inhalation of food and vomit: Secondary | ICD-10-CM | POA: Diagnosis not present

## 2022-08-05 ENCOUNTER — Telehealth: Payer: Self-pay | Admitting: Student in an Organized Health Care Education/Training Program

## 2022-08-05 NOTE — Telephone Encounter (Signed)
Left message to call back  

## 2022-08-07 NOTE — Telephone Encounter (Signed)
Lm x2 for patient.  Letter mailed to address on file.  Will close encounter per office protocol.  

## 2022-08-14 LAB — ACID FAST CULTURE WITH REFLEXED SENSITIVITIES (MYCOBACTERIA)
Acid Fast Culture: NEGATIVE
Acid Fast Culture: NEGATIVE

## 2022-08-18 ENCOUNTER — Ambulatory Visit (INDEPENDENT_AMBULATORY_CARE_PROVIDER_SITE_OTHER): Payer: HMO | Admitting: Family Medicine

## 2022-08-18 ENCOUNTER — Encounter: Payer: Self-pay | Admitting: Family Medicine

## 2022-08-18 VITALS — BP 142/70 | HR 86 | Temp 98.1°F | Ht 66.5 in | Wt 212.8 lb

## 2022-08-18 DIAGNOSIS — I10 Essential (primary) hypertension: Secondary | ICD-10-CM | POA: Diagnosis not present

## 2022-08-18 DIAGNOSIS — R609 Edema, unspecified: Secondary | ICD-10-CM

## 2022-08-18 DIAGNOSIS — S80812A Abrasion, left lower leg, initial encounter: Secondary | ICD-10-CM

## 2022-08-18 LAB — ACID FAST CULTURE WITH REFLEXED SENSITIVITIES (MYCOBACTERIA): Acid Fast Culture: NEGATIVE

## 2022-08-18 MED ORDER — LOSARTAN POTASSIUM 25 MG PO TABS
25.0000 mg | ORAL_TABLET | Freq: Every day | ORAL | 1 refills | Status: DC
Start: 2022-08-18 — End: 2022-12-02

## 2022-08-18 MED ORDER — DOXYCYCLINE HYCLATE 100 MG PO TABS: 100.0000 mg | ORAL_TABLET | Freq: Two times a day (BID) | ORAL | 0 refills | Status: DC

## 2022-08-18 NOTE — Assessment & Plan Note (Signed)
From scrape on brick last week  Some redness noted   Prescription docyxucline for possible early cellulitis Encouraged soap /water cleanse  Monitor closely  Call back and Er precautions reviewed in detail

## 2022-08-18 NOTE — Progress Notes (Signed)
Subjective:    Patient ID: Suzanne Strong, female    DOB: 1945/11/04, 77 y.o.   MRN: [id number]  HPI  Wt Readings from Last 3 Encounters:  08/18/22 212 lb 12.8 oz (96.5 kg)  07/31/22 207 lb 9.6 oz (94.2 kg)  07/30/22 210 lb 4 oz (95.4 kg)   33.83 kg/m  Vitals:   08/18/22 1214 08/18/22 1239  BP: (!) 140/78 (!) 142/70  Pulse: 86   Temp: 98.1 F (36.7 C)   SpO2: 97%     Pt presents for c/o ankle swelling   More swelling than usual in the past few days  Shiny and bumpy and some mild redness  Was actually worse yesterday  Tight feeling - hurts to move feet and plantar flex   She has some compression stockings to the knee Has not worn recently  Wears them if she is going to be standing   Perhaps standing and /or sitting more lately  Is behind at work so sitting longer to work on computer   Had a fall last week Scraped her left leg on a brick The scratch looks more red today   Cleaned it with alcohol     HTN bp is stable today  No cp or palpitations or headaches or edema  No side effects to medicines  BP Readings from Last 3 Encounters:  08/18/22 (!) 142/70  07/31/22 120/70  07/30/22 110/80     Pulse Readings from Last 3 Encounters:  08/18/22 86  07/31/22 77  07/30/22 74    No shortness of breath on exertion than usual  (what she has is baseline with her lung)  No cp or angina symptoms  No orthopnea or pnd     Amlodipine 5 mg daily   Past- took lisinopril and aldactone  Caused hypotension   Takes lyrica 150 mg daily  Meloxicam 15 mg prn    Dm2 with 3a CKD Sees Dr Tedd Sias  Rebelsus 3 mg oral daily Metformin  Glipizide  Lyrica for neuropathy    Has CKD Lab Results  Component Value Date   NA 133 (L) 07/21/2022   K 3.7 07/21/2022   CO2 26 07/21/2022   GLUCOSE 179 (H) 07/21/2022   BUN 29 (H) 07/21/2022   CREATININE 1.15 (H) 07/21/2022   CALCIUM 9.7 07/21/2022   GFR 47.03 (L) 02/12/2022   GFRNONAA 50 (L) 07/21/2022    Lab  Results  Component Value Date   WBC 6.7 07/21/2022   HGB 10.6 (L) 07/21/2022   HCT 35.4 (L) 07/21/2022   MCV 73.0 (L) 07/21/2022   PLT 389 07/21/2022    Lab Results  Component Value Date   ALT 11 02/12/2022   AST 16 02/12/2022   ALKPHOS 64 02/12/2022   BILITOT 0.3 02/12/2022    Seeing pulmonary for bronchiectasis with recurrent aspiration pna    Patient Active Problem List   Diagnosis Date Noted   Abrasion of left leg 08/18/2022   Bronchiectasis without complication (HCC) 07/31/2022   Diarrhea 07/22/2022   Lymphadenopathy 06/24/2022   Poor balance 04/28/2022   Vision changes 02/12/2022   Polypharmacy 02/12/2022   Plant dermatitis 10/21/2021   Nausea & vomiting 04/23/2021   At high risk for aspiration 04/23/2021   COVID-19 03/18/2021   Recurrent aspiration pneumonia (HCC) 12/09/2020   Dysuria 07/17/2020   Dysphagia    Gastric erythema    Gastric polyp    Encounter for screening colonoscopy    Polyp of colon    History of  aspiration pneumonia 02/15/2020   Diabetes mellitus without complication (HCC)    GERD (gastroesophageal reflux disease)    Hypertension    Urinary urgency 01/05/2020   Grief reaction 09/18/2019   Swallowing dysfunction 07/18/2019   Persistent cough for 3 weeks or longer 02/21/2019   Fever, intermittent 09/01/2018   Pre-syncope 02/07/2018   Orthostatic hypotension 01/26/2018   Episodic weakness 01/26/2018   Medicare annual wellness visit, subsequent 06/29/2017   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   Preoperative examination 04/27/2017   Carpal tunnel syndrome of right wrist 11/13/2016   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HCC) History of  02/17/2016   Obstructive sleep apnea 01/01/2016   Carcinoma of upper-outer quadrant of left breast in female, estrogen receptor negative (HCC) 11/21/2015   Hypomagnesemia 08/28/2015   Fever 04/11/2015   Initial Medicare annual wellness visit 10/16/2014   Estrogen deficiency 10/16/2014   Colon cancer  screening 10/16/2014   Controlled type 2 diabetes mellitus without complication (HCC) 03/28/2014   RLS (restless legs syndrome) 02/21/2014   Chronic neck pain 07/25/2010   Depression with anxiety 07/07/2010   Routine general medical examination at a health care facility 06/26/2010   B12 deficiency 09/04/2009   Anemia, iron deficiency 08/29/2009   Anxiety state 08/26/2009   GASTROPARESIS 08/26/2009   Vitamin D deficiency 07/19/2008   Disorder of bone and cartilage 07/19/2007   Hyperlipidemia 07/18/2007   Edema 07/18/2007   Past Medical History:  Diagnosis Date   Anemia    iron deficiency and B12 deficiency   Anxiety    Breast cancer (HCC) 02/25/2015   upper-outer quadrant of left female breast, Triple Negative. Neo-adjuvant chemo with complete pathologic response.    Cervical dysplasia    conization   Diabetes mellitus without complication (HCC)    Diverticulosis    Dyspnea    Fever 04/11/2015   Gastroparesis    related to previous radiation tx   GERD (gastroesophageal reflux disease)    Hyperlipidemia    Hypertension    Neuromuscular disorder (HCC)    Neuropathy    legs/feet, S/P chemo meds   Personal history of chemotherapy    Personal history of radiation therapy 2017   LEFT lumpectomy w/ radiation   Pneumonia    RLS (restless legs syndrome)    Shingles    Hx of   Sleep apnea    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HCC) 2006   Wears dentures    partial bottom   Past Surgical History:  Procedure Laterality Date   APPENDECTOMY     BREAST BIOPSY Left 02/25/2015   INVASIVE MAMMARY CARCINOMA,Triple negative.   BREAST CYST ASPIRATION     BREAST EXCISIONAL BIOPSY Left 12/2015   surgery   BREAST LUMPECTOMY WITH NEEDLE LOCALIZATION Left 10/02/2015   Procedure: BREAST LUMPECTOMY WITH NEEDLE LOCALIZATION;  Surgeon: Earline Mayotte, MD;  Location: [facility];  Service: General;  Laterality: Left;   BREAST LUMPECTOMY WITH SENTINEL LYMPH NODE BIOPSY Left 10/02/2015   Procedure: LEFT  BREAST WIDE EXCISION WITH SENTINEL LYMPH NODE BX;  Surgeon: Earline Mayotte, MD;  Location: [facility];  Service: General;  Laterality: Left;   BREAST SURGERY     breast biopsy benign   CARPAL TUNNEL RELEASE Right 05/19/2017   Procedure: CARPAL TUNNEL RELEASE;  Surgeon: Deeann Saint, MD;  Location: [facility];  Service: Orthopedics;  Laterality: Right;   CATARACT EXTRACTION W/PHACO Right 10/26/2018   Procedure: CATARACT EXTRACTION PHACO AND INTRAOCULAR LENS PLACEMENT (IOC) right diabetic;  Surgeon: Lockie Mola, MD;  Location: [facility];  Service: Ophthalmology;  Laterality: Right;  Diabetic - oral meds cancer center been notified and will come   CATARACT EXTRACTION W/PHACO Left 11/16/2018   Procedure: CATARACT EXTRACTION PHACO AND INTRAOCULAR LENS PLACEMENT (IOC) LEFT DIABETIC  01:01.0  17.0%  10.37;  Surgeon: Lockie Mola, MD;  Location: [facility];  Service: Ophthalmology;  Laterality: Left;  Diabetic - oral meds Port-a-cath   CHOLECYSTECTOMY     Cologuard  07/22/2016   Negative   COLONOSCOPY WITH PROPOFOL N/A 05/24/2020   Procedure: COLONOSCOPY WITH PROPOFOL;  Surgeon: Pasty Spillers, MD;  Location: [facility];  Service: Endoscopy;  Laterality: N/A;   ESOPHAGOGASTRODUODENOSCOPY  07/2009   normal with few gastric polyps   ESOPHAGOGASTRODUODENOSCOPY (EGD) WITH PROPOFOL N/A 05/24/2020   Procedure: ESOPHAGOGASTRODUODENOSCOPY (EGD) WITH PROPOFOL;  Surgeon: Pasty Spillers, MD;  Location: [facility];  Service: Endoscopy;  Laterality: N/A;   FLEXIBLE BRONCHOSCOPY Bilateral 06/24/2022   Procedure: FLEXIBLE BRONCHOSCOPY;  Surgeon: [name], MD;  Location: [facility];  Service: Pulmonary;  Laterality: Bilateral;   MOLE REMOVAL     PORT-A-CATH REMOVAL     PORTACATH PLACEMENT     PORTACATH PLACEMENT Right 03/12/2015   Procedure: INSERTION PORT-A-CATH;  Surgeon: Earline Mayotte, MD;  Location: [facility];  Service: General;  Laterality: Right;    RADICAL NECK DISSECTION     TONSILLECTOMY Left    cancer treated with chemo and radiation   TRIGGER FINGER RELEASE Right 05/19/2017   Procedure: RELEASE TRIGGER FINGER/A-1 PULLEY ring finger;  Surgeon: Deeann Saint, MD;  Location: [facility];  Service: Orthopedics;  Laterality: Right;   VIDEO BRONCHOSCOPY WITH ENDOBRONCHIAL ULTRASOUND Bilateral 06/24/2022   Procedure: VIDEO BRONCHOSCOPY WITH ENDOBRONCHIAL ULTRASOUND;  Surgeon: [name], MD;  Location: [facility];  Service: Pulmonary;  Laterality: Bilateral;   Social History   Tobacco Use   Smoking status: Former    Packs/day: .5    Types: Cigarettes    Quit date: 1985    Years since quitting: 39.5   Smokeless tobacco: Never  Vaping Use   Vaping Use: Never used  Substance Use Topics   Alcohol use: No    Alcohol/week: 0.0 standard drinks of alcohol   Drug use: No   Family History  Problem Relation Age of Onset   Osteoporosis Mother    Hyperlipidemia Mother    Depression Mother    Cancer Mother        skin cancer ? basal cell and lung ca heavy smoker   Alcohol abuse Father    Cancer Father        skin CA ? basal cell   Heart disease Father        CHF   Hyperlipidemia Brother    Hypertension Brother    Breast cancer Neg Hx    Allergies  Allergen Reactions   Amoxicillin Swelling    REACTION: rash, swelling Has patient had a PCN reaction causing immediate rash, facial/tongue/throat swelling, SOB or lightheadedness with hypotension: yes Has patient had a PCN reaction causing severe rash involving mucus membranes or skin necrosis: no Has patient had a PCN reaction that required hospitalization: no Has patient had a PCN reaction occurring within the last 10 years: yes If all of the above answers are "NO", then may proceed with Cephalosporin use.  Has tolerated ceftriaxone   Penicillin G Anaphylaxis    Has tolerated ceftriaxone on multiple occasions    Fluconazole Rash    REACTION: hives   Difuleron [Ferrous  Fumarate-Dss]    Other  Other (See Comments)    Pt has had tonsil cancer (on Left side) - pt may have difficulty swallowing   Current Outpatient Medications on File Prior to Visit  Medication Sig Dispense Refill   acetaminophen (TYLENOL) 325 MG tablet Take 2 tablets (650 mg total) by mouth every 6 (six) hours as needed for mild pain (or Fever >/= 101).     albuterol (PROVENTIL) (2.5 MG/3ML) 0.083% nebulizer solution Take 3 mLs (2.5 mg total) by nebulization in the morning and at bedtime. 180 mL 11   albuterol (VENTOLIN HFA) 108 (90 Base) MCG/ACT inhaler Inhale 2 puffs into the lungs every 6 (six) hours as needed for wheezing or shortness of breath. 8 g 3   ALPRAZolam (XANAX) 0.5 MG tablet Take 1 tablet (0.5 mg total) by mouth 2 (two) times daily as needed for anxiety. 60 tablet 5   amitriptyline (ELAVIL) 50 MG tablet Take 1 tablet (50 mg total) by mouth at bedtime. 90 tablet 3   atorvastatin (LIPITOR) 10 MG tablet TAKE ONE TABLET BY MOUTH ONCE DAILY 90 tablet 3   b complex vitamins tablet Take 1 tablet by mouth daily.     BIOTIN PO Take by mouth.     busPIRone (BUSPAR) 15 MG tablet Take 1.5 tabs po BID 270 tablet 1   chlorpheniramine-HYDROcodone (TUSSIONEX) 10-8 MG/5ML Take 5 mLs by mouth every 12 (twelve) hours as needed for cough. Caution of sedation 50 mL 0   Cholecalciferol (VITAMIN D-1000 MAX ST) 25 MCG (1000 UT) tablet Take 2,000 Units by mouth daily. Takes three 50 mcg tablets, 3 capsules daily     Emollient (COLLAGEN EX)      famotidine (PEPCID) 20 MG tablet Take 1 tablet (20 mg total) by mouth 2 (two) times daily. 60 tablet 5   ferrous sulfate 325 (65 FE) MG tablet Take 1 tablet (325 mg total) by mouth 2 (two) times daily with a meal.     glipiZIDE (GLUCOTROL) 5 MG tablet Take 2 tablets (10 mg total) by mouth daily before breakfast.     glucose blood (ONETOUCH ULTRA) test strip USE TO check blood glucose ONCE DAILY AND AS NEEDED FOR DIABETES 100 strip 1   meloxicam (MOBIC) 15 MG tablet  Take 1 tablet by mouth daily as needed for pain.     metFORMIN (GLUCOPHAGE) 500 MG tablet Take 2 tablets (1,000 mg total) by mouth 2 (two) times daily with a meal. (Patient taking differently: Take 2,000 mg by mouth daily with breakfast.)     Multiple Vitamins-Minerals (WOMENS MULTIVITAMIN PO) Take by mouth.     omeprazole (PRILOSEC) 20 MG capsule TAKE ONE CAPSULE BY MOUTH EVERYDAY AT BEDTIME 90 capsule 3   ondansetron (ZOFRAN) 4 MG tablet Take 1 tablet (4 mg total) by mouth every 8 (eight) hours as needed for nausea or vomiting. 15 tablet 0   pregabalin (LYRICA) 150 MG capsule Take 300 mg by mouth at bedtime.     rOPINIRole (REQUIP) 1 MG tablet TAKE 1/2 TABLET BY MOUTH EVERY MORNING and TAKE THREE TABLETS BY MOUTH EVERYDAY AT BEDTIME 315 tablet 3   RYBELSUS 3 MG TABS Take 3 mg by mouth daily.     Sodium Chloride (HYPERSAL) 3.5 % NEBU Take 1 each by nebulization 2 (two) times daily as needed. 240 mL 5   temazepam (RESTORIL) 15 MG capsule Take 1 capsule (15 mg total) by mouth at bedtime as needed for sleep. 30 capsule 5   zinc gluconate 50 MG tablet Take 50 mg  by mouth daily.     sertraline (ZOLOFT) 100 MG tablet Take 2 tablets (200 mg total) by mouth every morning. 180 tablet 1   Current Facility-Administered Medications on File Prior to Visit  Medication Dose Route Frequency Provider Last Rate Last Admin   influenza vaccine adjuvanted (FLUAD) injection 0.5 mL  0.5 mL Intramuscular Once Louretta Shorten R, MD       sodium chloride flush (NS) 0.9 % injection 10 mL  10 mL Intravenous PRN Earna Coder, MD   10 mL at 09/11/15 0845    Review of Systems  Constitutional:  Negative for activity change, appetite change, fatigue, fever and unexpected weight change.  HENT:  Negative for congestion, ear pain, rhinorrhea, sinus pressure and sore throat.   Eyes:  Negative for pain, redness and visual disturbance.  Respiratory:  Negative for cough, shortness of breath and wheezing.    Cardiovascular:  Positive for leg swelling. Negative for chest pain and palpitations.  Gastrointestinal:  Negative for abdominal pain, blood in stool, constipation and diarrhea.  Endocrine: Negative for polydipsia and polyuria.  Genitourinary:  Negative for dysuria, frequency and urgency.  Musculoskeletal:  Negative for arthralgias, back pain and myalgias.  Skin:  Positive for wound. Negative for pallor and rash.  Allergic/Immunologic: Negative for environmental allergies.  Neurological:  Negative for dizziness, syncope and headaches.  Hematological:  Negative for adenopathy. Does not bruise/bleed easily.  Psychiatric/Behavioral:  Negative for decreased concentration and dysphoric mood. The patient is not nervous/anxious.        Objective:   Physical Exam Constitutional:      General: She is not in acute distress.    Appearance: Normal appearance. She is obese. She is not ill-appearing.  Eyes:     General:        Right eye: No discharge.        Left eye: No discharge.     Conjunctiva/sclera: Conjunctivae normal.     Pupils: Pupils are equal, round, and reactive to light.  Cardiovascular:     Rate and Rhythm: Normal rate and regular rhythm.     Pulses: Normal pulses.  Pulmonary:     Effort: Pulmonary effort is normal. No respiratory distress.     Breath sounds: Normal breath sounds. No stridor. No wheezing or rhonchi.     Comments: Some baseline harsh bs at bases  Abdominal:     General: Abdomen is flat. Bowel sounds are normal. There is no distension.     Palpations: Abdomen is soft. There is no mass.     Tenderness: There is no abdominal tenderness.  Musculoskeletal:     Right lower leg: Edema present.     Left lower leg: Edema present.     Comments: 1 plus pedal edema  Some vesicles and hyperpigmented skin No drainage Not tender   Skin:    Comments: Some hyperpigmentation and blistering from pedal edema   1.5 cm wound on left calf with 0.5 collar of erythema  surrounding  Non tender  No drainage    Neurological:     Mental Status: She is alert.     Deep Tendon Reflexes: Reflexes normal.  Psychiatric:        Mood and Affect: Mood normal.           Assessment & Plan:   Problem List Items Addressed This Visit       Cardiovascular and Mediastinum   Hypertension    BP: (!) 142/70  Lower at home   Will  change amlodipine to losartan 25 mg daily due to pedal edema Discussed avoiding added sodium/ processed foods Follow up 2-3 wk  Encouraged to alert if side eff      Relevant Medications   losartan (COZAAR) 25 MG tablet     Musculoskeletal and Integument   Abrasion of left leg    From scrape on brick last week  Some redness noted   Prescription docyxucline for possible early cellulitis Encouraged soap /water cleanse  Monitor closely  Call back and Er precautions reviewed in detail         Other   Edema - Primary    Pedal edema w/o other symptoms  No doubt multi factorial (heat, amlodipine, prolonged sitting) Will try holding amlodipine and change to losartan 15 mg aily  Watch tp at home Elevate Use compression knee highs Follow up 2-3 weeks for re check and labs ER and call back precautions noted

## 2022-08-18 NOTE — Patient Instructions (Addendum)
Hold your amlodipine   Switch to losartan 25 mg daily for blood pressure If any side effects or problems let us know   Watch blood pressure at home   Watch your sodium intake from processed foods Make sure to drink enough water   When able- elevate feet when sitting   Wear your compression knee highs   Follow up here in 2-3 weeks   Take doxycycline for wound on leg  Keep clean with soap and water  Update if not starting to improve in a week or if worsening

## 2022-08-18 NOTE — Assessment & Plan Note (Signed)
Pedal edema w/o other symptoms  No doubt multi factorial (heat, amlodipine, prolonged sitting) Will try holding amlodipine and change to losartan 15 mg aily  Watch tp at home Elevate Use compression knee highs Follow up 2-3 weeks for re check and labs ER and call back precautions noted

## 2022-08-18 NOTE — Assessment & Plan Note (Signed)
BP: (!) 142/70  Lower at home   Will change amlodipine to losartan 25 mg daily due to pedal edema Discussed avoiding added sodium/ processed foods Follow up 2-3 wk  Encouraged to alert if side eff

## 2022-09-03 DIAGNOSIS — J690 Pneumonitis due to inhalation of food and vomit: Secondary | ICD-10-CM | POA: Diagnosis not present

## 2022-09-09 ENCOUNTER — Ambulatory Visit: Payer: HMO | Admitting: Student in an Organized Health Care Education/Training Program

## 2022-09-10 ENCOUNTER — Encounter: Payer: Self-pay | Admitting: Student in an Organized Health Care Education/Training Program

## 2022-09-10 ENCOUNTER — Other Ambulatory Visit: Payer: Self-pay | Admitting: Family Medicine

## 2022-09-21 ENCOUNTER — Encounter: Payer: Self-pay | Admitting: Internal Medicine

## 2022-09-21 ENCOUNTER — Ambulatory Visit (INDEPENDENT_AMBULATORY_CARE_PROVIDER_SITE_OTHER): Payer: HMO | Admitting: Internal Medicine

## 2022-09-21 VITALS — BP 118/70 | HR 85 | Temp 96.0°F | Ht 66.5 in | Wt 188.0 lb

## 2022-09-21 DIAGNOSIS — R110 Nausea: Secondary | ICD-10-CM | POA: Diagnosis not present

## 2022-09-21 NOTE — Assessment & Plan Note (Signed)
Seen in ER for nausea---did finally improve (diarrhea at times also) States she is off the metformin now Was on ozempic--but stopped due to insurance changes This got better--than recurred in the past week Exam is benign---no fever or signs of infection Now on rybelsus--symptoms could presumably be from this (but only started it a month ago) Now ready to increase to 7mg  dose---if symptoms increase, it is likely implicated

## 2022-09-21 NOTE — Progress Notes (Signed)
Subjective:    Patient ID: Suzanne Strong, female    DOB: Nov 05, 1945, 77 y.o.   MRN: [id number]  HPI Here due to not feeling well in the past week  Some symptoms about 2 months ago Would get diarrhea or nausea on one day---then better briefly These did not occur together Seemed to get better  Now not feeling right again for the past week Tested negative for COVID 2 days ago Some fever--she feels it at night (up to 100) No sig cough, rhinorrhea, congestion No abdominal pain No vomiting--hasn't been able to since past surgery  Did have bronchoscopy and endoscopic evaluation Thick secretions were found in the oropharynx and bronchial tubes down to subsegmental level She was supposed to have antibiotics--but doesn't think she had it  Current Outpatient Medications on File Prior to Visit  Medication Sig Dispense Refill   acetaminophen (TYLENOL) 325 MG tablet Take 2 tablets (650 mg total) by mouth every 6 (six) hours as needed for mild pain (or Fever >/= 101).     albuterol (PROVENTIL) (2.5 MG/3ML) 0.083% nebulizer solution Take 3 mLs (2.5 mg total) by nebulization in the morning and at bedtime. 180 mL 11   albuterol (VENTOLIN HFA) 108 (90 Base) MCG/ACT inhaler Inhale 2 puffs into the lungs every 6 (six) hours as needed for wheezing or shortness of breath. 8 g 3   amitriptyline (ELAVIL) 50 MG tablet Take 1 tablet (50 mg total) by mouth at bedtime. 90 tablet 3   atorvastatin (LIPITOR) 10 MG tablet TAKE ONE TABLET BY MOUTH ONCE DAILY 90 tablet 3   b complex vitamins tablet Take 1 tablet by mouth daily.     BIOTIN PO Take by mouth.     busPIRone (BUSPAR) 15 MG tablet Take 1.5 tabs po BID 270 tablet 1   chlorpheniramine-HYDROcodone (TUSSIONEX) 10-8 MG/5ML Take 5 mLs by mouth every 12 (twelve) hours as needed for cough. Caution of sedation 50 mL 0   Cholecalciferol (VITAMIN D-1000 MAX ST) 25 MCG (1000 UT) tablet Take 2,000 Units by mouth daily. Takes three 50 mcg tablets, 3 capsules  daily     Emollient (COLLAGEN EX)      famotidine (PEPCID) 20 MG tablet Take 1 tablet by mouth twice daily 180 tablet 0   ferrous sulfate 325 (65 FE) MG tablet Take 1 tablet (325 mg total) by mouth 2 (two) times daily with a meal.     glipiZIDE (GLUCOTROL) 5 MG tablet Take 2 tablets (10 mg total) by mouth daily before breakfast.     glucose blood (ONETOUCH ULTRA) test strip USE TO check blood glucose ONCE DAILY AND AS NEEDED FOR DIABETES 100 strip 1   losartan (COZAAR) 25 MG tablet Take 1 tablet (25 mg total) by mouth daily. 30 tablet 1   meloxicam (MOBIC) 15 MG tablet Take 1 tablet by mouth daily as needed for pain.     Multiple Vitamins-Minerals (WOMENS MULTIVITAMIN PO) Take by mouth.     omeprazole (PRILOSEC) 20 MG capsule TAKE ONE CAPSULE BY MOUTH EVERYDAY AT BEDTIME 90 capsule 3   ondansetron (ZOFRAN) 4 MG tablet Take 1 tablet (4 mg total) by mouth every 8 (eight) hours as needed for nausea or vomiting. 15 tablet 0   pregabalin (LYRICA) 150 MG capsule Take 300 mg by mouth at bedtime.     rOPINIRole (REQUIP) 1 MG tablet TAKE 1/2 TABLET BY MOUTH EVERY MORNING and TAKE THREE TABLETS BY MOUTH EVERYDAY AT BEDTIME 315 tablet 3   RYBELSUS 3  MG TABS Take 3 mg by mouth daily.     Sodium Chloride (HYPERSAL) 3.5 % NEBU Take 1 each by nebulization 2 (two) times daily as needed. 240 mL 5   temazepam (RESTORIL) 15 MG capsule Take 1 capsule (15 mg total) by mouth at bedtime as needed for sleep. 30 capsule 5   zinc gluconate 50 MG tablet Take 50 mg by mouth daily.     ALPRAZolam (XANAX) 0.5 MG tablet Take 1 tablet (0.5 mg total) by mouth 2 (two) times daily as needed for anxiety. 60 tablet 5   sertraline (ZOLOFT) 100 MG tablet Take 2 tablets (200 mg total) by mouth every morning. 180 tablet 1   Current Facility-Administered Medications on File Prior to Visit  Medication Dose Route Frequency Provider Last Rate Last Admin   influenza vaccine adjuvanted (FLUAD) injection 0.5 mL  0.5 mL Intramuscular Once  Louretta Shorten R, MD       sodium chloride flush (NS) 0.9 % injection 10 mL  10 mL Intravenous PRN Earna Coder, MD   10 mL at 09/11/15 0845    Allergies  Allergen Reactions   Amoxicillin Swelling    REACTION: rash, swelling Has patient had a PCN reaction causing immediate rash, facial/tongue/throat swelling, SOB or lightheadedness with hypotension: yes Has patient had a PCN reaction causing severe rash involving mucus membranes or skin necrosis: no Has patient had a PCN reaction that required hospitalization: no Has patient had a PCN reaction occurring within the last 10 years: yes If all of the above answers are "NO", then may proceed with Cephalosporin use.  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 Neo-adjuvant chemo with complete pathologic response.    Cervical dysplasia    conization   Diabetes mellitus without complication (HCC)    Diverticulosis    Dyspnea    Fever 04/11/2015   Gastroparesis    related to previous radiation tx   GERD (gastroesophageal reflux disease)    Hyperlipidemia    Hypertension    Neuromuscular disorder (HCC)    Neuropathy    legs/feet, S/P chemo meds   Personal history of chemotherapy    Personal history of radiation therapy 2017   LEFT lumpectomy w/ radiation   Pneumonia    RLS (restless legs syndrome)    Shingles    Hx of   Sleep apnea    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HCC) 2006   Wears dentures    partial bottom    Past Surgical History:  Procedure Laterality Date   APPENDECTOMY      BREAST BIOPSY Left 02/25/2015   INVASIVE MAMMARY CARCINOMA,Triple negative.   BREAST CYST ASPIRATION     BREAST EXCISIONAL BIOPSY Left 12/2015   surgery   BREAST LUMPECTOMY WITH NEEDLE LOCALIZATION Left 10/02/2015   Procedure: BREAST LUMPECTOMY WITH NEEDLE LOCALIZATION;  Surgeon: Earline Mayotte, MD;  Location: [facility];  Service: General;  Laterality: Left;   BREAST LUMPECTOMY WITH SENTINEL LYMPH NODE BIOPSY Left 10/02/2015   Procedure:  LEFT BREAST WIDE EXCISION WITH SENTINEL LYMPH NODE BX;  Surgeon: Earline Mayotte, MD;  Location: [facility];  Service: General;  Laterality: Left;   BREAST SURGERY     breast biopsy benign   CARPAL TUNNEL RELEASE Right 05/19/2017   Procedure: CARPAL TUNNEL RELEASE;  Surgeon: Deeann Saint, MD;  Location: [facility];  Service: Orthopedics;  Laterality: Right;   CATARACT EXTRACTION W/PHACO Right 10/26/2018   Procedure: CATARACT EXTRACTION PHACO AND INTRAOCULAR LENS PLACEMENT (IOC) right diabetic;  Surgeon: Lockie Mola, MD;  Location: [facility];  Service: Ophthalmology;  Laterality: Right;  Diabetic - oral meds cancer center been notified and will come   CATARACT EXTRACTION W/PHACO Left 11/16/2018   Procedure: CATARACT EXTRACTION PHACO AND INTRAOCULAR LENS PLACEMENT (IOC) LEFT DIABETIC  01:01.0  17.0%  10.37;  Surgeon: Lockie Mola, MD;  Location: [facility];  Service: Ophthalmology;  Laterality: Left;  Diabetic - oral meds Port-a-cath   CHOLECYSTECTOMY     Cologuard  07/22/2016   Negative   COLONOSCOPY WITH PROPOFOL N/A 05/24/2020   Procedure: COLONOSCOPY WITH PROPOFOL;  Surgeon: Pasty Spillers, MD;  Location: [facility];  Service: Endoscopy;  Laterality: N/A;   ESOPHAGOGASTRODUODENOSCOPY  07/2009   normal with few gastric polyps   ESOPHAGOGASTRODUODENOSCOPY (EGD) WITH PROPOFOL N/A 05/24/2020   Procedure: ESOPHAGOGASTRODUODENOSCOPY (EGD) WITH PROPOFOL;  Surgeon: Pasty Spillers, MD;  Location: [facility];  Service: Endoscopy;  Laterality: N/A;   FLEXIBLE BRONCHOSCOPY Bilateral 06/24/2022   Procedure: FLEXIBLE BRONCHOSCOPY;  Surgeon: [name], MD;  Location: [facility];  Service: Pulmonary;  Laterality: Bilateral;   MOLE REMOVAL     PORT-A-CATH REMOVAL     PORTACATH PLACEMENT     PORTACATH PLACEMENT Right 03/12/2015   Procedure: INSERTION PORT-A-CATH;  Surgeon: Earline Mayotte, MD;  Location: [facility];  Service: General;  Laterality: Right;   RADICAL NECK DISSECTION     TONSILLECTOMY Left    cancer treated with chemo and radiation   TRIGGER FINGER RELEASE Right 05/19/2017   Procedure: RELEASE TRIGGER FINGER/A-1 PULLEY ring finger;  Surgeon: Deeann Saint, MD;  Location: [facility];  Service: Orthopedics;  Laterality: Right;   VIDEO BRONCHOSCOPY WITH ENDOBRONCHIAL ULTRASOUND Bilateral 06/24/2022   Procedure: VIDEO BRONCHOSCOPY WITH ENDOBRONCHIAL ULTRASOUND;  Surgeon: [name], MD;  Location: [facility];  Service: Pulmonary;  Laterality: Bilateral;    Family History  Problem Relation Age of Onset   Osteoporosis Mother    Hyperlipidemia Mother    Depression Mother    Cancer Mother        skin cancer ? basal cell and lung ca heavy smoker   Alcohol abuse Father    Cancer Father        skin CA ? basal cell   Heart disease Father        CHF   Hyperlipidemia Brother    Hypertension Brother    Breast cancer Neg Hx     Social History   Socioeconomic History   Marital status: Widowed    Spouse name: Not on file   Number of children: 0   Years of education: 14   Highest education level: Not on file  Occupational History   Occupation: book Biomedical engineer  Tobacco Use   Smoking status: Former    Current packs/day: 0.00    Types: Cigarettes    Quit date: 1985    Years since quitting: 39.6   Smokeless tobacco: Never  Vaping Use   Vaping status: Never Used  Substance and Sexual Activity  Alcohol use: No    Alcohol/week: 0.0 standard drinks of alcohol   Drug use: No    Sexual activity: Not Currently  Other Topics Concern   Not on file  Social History Narrative   Lives with husband in a one story home.  No children.        Works as Catering manager for [employer].        Education: college.    Social Determinants of Health   Financial Resource Strain: Low Risk  (11/27/2021)   Overall Financial Resource Strain (CARDIA)    Difficulty of Paying Living Expenses: Not hard at all  Food Insecurity: No Food Insecurity (11/27/2021)   Hunger Vital Sign    Worried About Running Out of Food in the Last Year: Never true    Ran Out of Food in the Last Year: Never true  Transportation Needs: No Transportation Needs (11/27/2021)   PRAPARE - Administrator, Civil Service (Medical): No    Lack of Transportation (Non-Medical): No  Physical Activity: Insufficiently Active (11/27/2021)   Exercise Vital Sign    Days of Exercise per Week: 3 days    Minutes of Exercise per Session: 30 min  Stress: No Stress Concern Present (11/27/2021)   [employer] of Occupational Health - Occupational Stress Questionnaire    Feeling of Stress : Not at all  Social Connections: Moderately Integrated (11/27/2021)   Social Connection and Isolation Panel [NHANES]    Frequency of Communication with Friends and Family: More than three times a week    Frequency of Social Gatherings with Friends and Family: More than three times a week    Attends Religious Services: More than 4 times per year    Active Member of [employer] or Organizations: Yes    Attends Banker Meetings: More than 4 times per year    Marital Status: Widowed  Intimate Partner Violence: Not At Risk (11/27/2021)   Humiliation, Afraid, Rape, and Kick questionnaire    Fear of Current or Ex-Partner: No    Emotionally Abused: No    Physically Abused: No    Sexually Abused: No   Review of Systems No new meds other than the rybelsus--for past 30 days Has lost weight    Objective:   Physical  Exam Constitutional:      Appearance: Normal appearance.  Cardiovascular:     Rate and Rhythm: Normal rate and regular rhythm.     Heart sounds: No murmur heard.    No gallop.  Pulmonary:     Effort: Pulmonary effort is normal.     Breath sounds: Normal breath sounds. No wheezing or rales.  Abdominal:     General: There is no distension.     Palpations: Abdomen is soft. There is no mass.     Tenderness: There is no abdominal tenderness. There is no guarding or rebound.  Musculoskeletal:     Cervical back: Neck supple.  Lymphadenopathy:     Cervical: No cervical adenopathy.  Neurological:     Mental Status: She is alert.            Assessment & Plan:

## 2022-09-30 DIAGNOSIS — M170 Bilateral primary osteoarthritis of knee: Secondary | ICD-10-CM | POA: Diagnosis not present

## 2022-10-04 DIAGNOSIS — J690 Pneumonitis due to inhalation of food and vomit: Secondary | ICD-10-CM | POA: Diagnosis not present

## 2022-11-04 ENCOUNTER — Ambulatory Visit: Payer: HMO | Admitting: Psychiatry

## 2022-11-04 DIAGNOSIS — J690 Pneumonitis due to inhalation of food and vomit: Secondary | ICD-10-CM | POA: Diagnosis not present

## 2022-11-05 ENCOUNTER — Encounter: Payer: Self-pay | Admitting: Family Medicine

## 2022-11-05 ENCOUNTER — Telehealth (INDEPENDENT_AMBULATORY_CARE_PROVIDER_SITE_OTHER): Payer: HMO | Admitting: Family Medicine

## 2022-11-05 DIAGNOSIS — Z79899 Other long term (current) drug therapy: Secondary | ICD-10-CM | POA: Diagnosis not present

## 2022-11-05 DIAGNOSIS — Z7984 Long term (current) use of oral hypoglycemic drugs: Secondary | ICD-10-CM | POA: Diagnosis not present

## 2022-11-05 DIAGNOSIS — R253 Fasciculation: Secondary | ICD-10-CM | POA: Diagnosis not present

## 2022-11-05 DIAGNOSIS — C801 Malignant (primary) neoplasm, unspecified: Secondary | ICD-10-CM

## 2022-11-05 DIAGNOSIS — G63 Polyneuropathy in diseases classified elsewhere: Secondary | ICD-10-CM | POA: Diagnosis not present

## 2022-11-05 DIAGNOSIS — F418 Other specified anxiety disorders: Secondary | ICD-10-CM | POA: Diagnosis not present

## 2022-11-05 DIAGNOSIS — E538 Deficiency of other specified B group vitamins: Secondary | ICD-10-CM

## 2022-11-05 DIAGNOSIS — E119 Type 2 diabetes mellitus without complications: Secondary | ICD-10-CM

## 2022-11-05 NOTE — Progress Notes (Signed)
Virtual Visit via Video Note  I connected with [name] on 11/05/22 at  9:30 AM EDT by a video enabled telemedicine application and verified that I am speaking with the correct person using two identifiers.  Location: Patient: home Provider: office    I discussed the limitations of evaluation and management by telemedicine and the availability of in person appointments. The patient expressed understanding and agreed to proceed.  Parties involved in encounter  Patient: Suzanne Strong   Provider:  Roxy Manns MD   History of Present Illness:  Pt presents with c/o "jerking"  Limbs primarily  Severe at times  It causes her to fall   No facial ticks   1-2 weeks   Had gone to the beach   This happened once before when her husband died  Saw PT and did get better   Neurology in past   Anx/ dep Zoloft 200 mg  Buspar 15 mg 1.5 mg bid  Elavil 50 mg at bedtime  Temazepam- not taking (was for sleep)  Xanax prn   Takes requip for RLS 1/2 tab in am and 3 at bedtime   Lyrica 300 mg daily  For neuropathy    Sees psychiatry oct 1 (had to re schedule)    Lab Results  Component Value Date   NA 133 (L) 07/21/2022   K 3.7 07/21/2022   CO2 26 07/21/2022   GLUCOSE 179 (H) 07/21/2022   BUN 29 (H) 07/21/2022   CREATININE 1.15 (H) 07/21/2022   CALCIUM 9.7 07/21/2022   GFR 47.03 (L) 02/12/2022   GFRNONAA 50 (L) 07/21/2022   Lab Results  Component Value Date   ALT 11 02/12/2022   AST 16 02/12/2022   ALKPHOS 64 02/12/2022   BILITOT 0.3 02/12/2022   Lab Results  Component Value Date   WBC 6.7 07/21/2022   HGB 10.6 (L) 07/21/2022   HCT 35.4 (L) 07/21/2022   MCV 73.0 (L) 07/21/2022   PLT 389 07/21/2022     Has had a lot of stress in the course of the months  No danger/not being abused  A lot of physical /medical changes   Does counseling also   Last neuro visit 04/11/18 Dr Allena Katz was for orthostasis and neuropathy and carpal tunnel     Patient Active  Problem List   Diagnosis Date Noted   Jerking 11/05/2022   Nausea 09/21/2022   Bronchiectasis without complication (HCC) 07/31/2022   Diarrhea 07/22/2022   Lymphadenopathy 06/24/2022   Poor balance 04/28/2022   Vision changes 02/12/2022   Polypharmacy 02/12/2022   Nausea & vomiting 04/23/2021   At high risk for aspiration 04/23/2021   COVID-19 03/18/2021   Recurrent aspiration pneumonia (HCC) 12/09/2020   Dysuria 07/17/2020   Dysphagia    Gastric erythema    Gastric polyp    Encounter for screening colonoscopy    Polyp of colon    History of aspiration pneumonia 02/15/2020   Diabetes mellitus without complication (HCC)    GERD (gastroesophageal reflux disease)    Hypertension    Urinary urgency 01/05/2020   Grief reaction 09/18/2019   Swallowing dysfunction 07/18/2019   Persistent cough for 3 weeks or longer 02/21/2019   Fever, intermittent 09/01/2018   Pre-syncope 02/07/2018   Orthostatic hypotension 01/26/2018   Episodic weakness 01/26/2018   Medicare annual wellness visit, subsequent 06/29/2017   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   Preoperative examination 04/27/2017   Carpal tunnel syndrome of right wrist 11/13/2016   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HCC)  History of  02/17/2016   Obstructive sleep apnea 01/01/2016   Carcinoma of upper-outer quadrant of left breast in female, estrogen receptor negative (HCC) 11/21/2015   Hypomagnesemia 08/28/2015   Fever 04/11/2015   Initial Medicare annual wellness visit 10/16/2014   Estrogen deficiency 10/16/2014   Colon cancer screening 10/16/2014   Controlled type 2 diabetes mellitus without complication (HCC) 03/28/2014   RLS (restless legs syndrome) 02/21/2014   Chronic neck pain 07/25/2010   Depression with anxiety 07/07/2010   Routine general medical examination at a health care facility 06/26/2010   B12 deficiency 09/04/2009   Anemia, iron deficiency 08/29/2009   Anxiety state 08/26/2009   GASTROPARESIS 08/26/2009    Vitamin D deficiency 07/19/2008   Disorder of bone and cartilage 07/19/2007   Hyperlipidemia 07/18/2007   Edema 07/18/2007   Past Medical History:  Diagnosis Date   Anemia    iron deficiency and B12 deficiency   Anxiety    Breast cancer (HCC) 02/25/2015   upper-outer quadrant of left female breast, Triple Negative. Neo-adjuvant chemo with complete pathologic response.    Cervical dysplasia    conization   Diabetes mellitus without complication (HCC)    Diverticulosis    Dyspnea    Fever 04/11/2015   Gastroparesis    related to previous radiation tx   GERD (gastroesophageal reflux disease)    Hyperlipidemia    Hypertension    Neuromuscular disorder (HCC)    Neuropathy    legs/feet, S/P chemo meds   Personal history of chemotherapy    Personal history of radiation therapy 2017   LEFT lumpectomy w/ radiation   Pneumonia    RLS (restless legs syndrome)    Shingles    Hx of   Sleep apnea    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HCC) 2006   Wears dentures    partial bottom   Past Surgical History:  Procedure Laterality Date   APPENDECTOMY     BREAST BIOPSY Left 02/25/2015   INVASIVE MAMMARY CARCINOMA,Triple negative.   BREAST CYST ASPIRATION     BREAST EXCISIONAL BIOPSY Left 12/2015   surgery   BREAST LUMPECTOMY WITH NEEDLE LOCALIZATION Left 10/02/2015   Procedure: BREAST LUMPECTOMY WITH NEEDLE LOCALIZATION;  Surgeon: Earline Mayotte, MD;  Location: [facility];  Service: General;  Laterality: Left;   BREAST LUMPECTOMY WITH SENTINEL LYMPH NODE BIOPSY Left 10/02/2015   Procedure: LEFT BREAST WIDE EXCISION WITH SENTINEL LYMPH NODE BX;  Surgeon: Earline Mayotte, MD;  Location: [facility];  Service: General;  Laterality: Left;   BREAST SURGERY     breast biopsy benign   CARPAL TUNNEL RELEASE Right 05/19/2017   Procedure: CARPAL TUNNEL RELEASE;  Surgeon: Deeann Saint, MD;  Location: [facility];  Service: Orthopedics;  Laterality: Right;   CATARACT EXTRACTION W/PHACO Right 10/26/2018    Procedure: CATARACT EXTRACTION PHACO AND INTRAOCULAR LENS PLACEMENT (IOC) right diabetic;  Surgeon: Lockie Mola, MD;  Location: [facility];  Service: Ophthalmology;  Laterality: Right;  Diabetic - oral meds cancer center been notified and will come   CATARACT EXTRACTION W/PHACO Left 11/16/2018   Procedure: CATARACT EXTRACTION PHACO AND INTRAOCULAR LENS PLACEMENT (IOC) LEFT DIABETIC  01:01.0  17.0%  10.37;  Surgeon: Lockie Mola, MD;  Location: [facility];  Service: Ophthalmology;  Laterality: Left;  Diabetic - oral meds Port-a-cath   CHOLECYSTECTOMY     Cologuard  07/22/2016   Negative   COLONOSCOPY WITH PROPOFOL N/A 05/24/2020   Procedure: COLONOSCOPY WITH PROPOFOL;  Surgeon: Pasty Spillers, MD;  Location: [facility]  ENDOSCOPY;  Service: Endoscopy;  Laterality: N/A;   ESOPHAGOGASTRODUODENOSCOPY  07/2009   normal with few gastric polyps   ESOPHAGOGASTRODUODENOSCOPY (EGD) WITH PROPOFOL N/A 05/24/2020   Procedure: ESOPHAGOGASTRODUODENOSCOPY (EGD) WITH PROPOFOL;  Surgeon: Pasty Spillers, MD;  Location: [facility];  Service: Endoscopy;  Laterality: N/A;   FLEXIBLE BRONCHOSCOPY Bilateral 06/24/2022   Procedure: FLEXIBLE BRONCHOSCOPY;  Surgeon: [name], MD;  Location: [facility];  Service: Pulmonary;  Laterality: Bilateral;   MOLE REMOVAL     PORT-A-CATH REMOVAL     PORTACATH PLACEMENT     PORTACATH PLACEMENT Right 03/12/2015   Procedure: INSERTION PORT-A-CATH;  Surgeon: Earline Mayotte, MD;  Location: [facility];  Service: General;  Laterality: Right;   RADICAL NECK DISSECTION     TONSILLECTOMY Left    cancer treated with chemo and radiation   TRIGGER FINGER RELEASE Right 05/19/2017   Procedure: RELEASE TRIGGER FINGER/A-1 PULLEY ring finger;  Surgeon: Deeann Saint, MD;  Location: [facility];  Service: Orthopedics;  Laterality: Right;   VIDEO BRONCHOSCOPY WITH ENDOBRONCHIAL ULTRASOUND Bilateral 06/24/2022   Procedure: VIDEO BRONCHOSCOPY WITH  ENDOBRONCHIAL ULTRASOUND;  Surgeon: [name], MD;  Location: [facility];  Service: Pulmonary;  Laterality: Bilateral;   Social History   Tobacco Use   Smoking status: Former    Current packs/day: 0.00    Types: Cigarettes    Quit date: 1985    Years since quitting: 39.7   Smokeless tobacco: Never  Vaping Use   Vaping status: Never Used  Substance Use Topics   Alcohol use: No    Alcohol/week: 0.0 standard drinks of alcohol   Drug use: No   Family History  Problem Relation Age of Onset   Osteoporosis Mother    Hyperlipidemia Mother    Depression Mother    Cancer Mother        skin cancer ? basal cell and lung ca heavy smoker   Alcohol abuse Father    Cancer Father        skin CA ? basal cell   Heart disease Father        CHF   Hyperlipidemia Brother    Hypertension Brother    Breast cancer Neg Hx    Allergies  Allergen Reactions   Amoxicillin Swelling    REACTION: rash, swelling Has patient had a PCN reaction causing immediate rash, facial/tongue/throat swelling, SOB or lightheadedness with hypotension: yes Has patient had a PCN reaction causing severe rash involving mucus membranes or skin necrosis: no Has patient had a PCN reaction that required hospitalization: no Has patient had a PCN reaction occurring within the last 10 years: yes If all of the above answers are "NO", then may proceed with Cephalosporin use.  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     albuterol (PROVENTIL) (2.5  MG/3ML) 0.083% nebulizer solution Take 3 mLs (2.5 mg total) by nebulization in the morning and at bedtime. 180 mL 11   albuterol (VENTOLIN HFA) 108 (90 Base)  MCG/ACT inhaler Inhale 2 puffs into the lungs every 6 (six) hours as needed for wheezing or shortness of breath. 8 g 3   amitriptyline (ELAVIL) 50 MG tablet Take 1 tablet (50 mg total) by mouth at bedtime. 90 tablet 3   atorvastatin (LIPITOR) 10 MG tablet TAKE ONE TABLET BY MOUTH ONCE DAILY 90 tablet 3   b complex vitamins tablet Take 1 tablet by mouth daily.     BIOTIN PO Take by mouth.     busPIRone (BUSPAR) 15 MG tablet Take 1.5 tabs po BID 270 tablet 1   Cholecalciferol (VITAMIN D-1000 MAX ST) 25 MCG (1000 UT) tablet Take 2,000 Units by mouth daily. Takes three 50 mcg tablets, 3 capsules daily     Emollient (COLLAGEN EX)      famotidine (PEPCID) 20 MG tablet Take 1 tablet by mouth twice daily 180 tablet 0   ferrous sulfate 325 (65 FE) MG tablet Take 1 tablet (325 mg total) by mouth 2 (two) times daily with a meal.     glipiZIDE (GLUCOTROL) 5 MG tablet Take 2 tablets (10 mg total) by mouth daily before breakfast.     glucose blood (ONETOUCH ULTRA) test strip USE TO check blood glucose ONCE DAILY AND AS NEEDED FOR DIABETES 100 strip 1   losartan (COZAAR) 25 MG tablet Take 1 tablet (25 mg total) by mouth daily. 30 tablet 1   meloxicam (MOBIC) 15 MG tablet Take 1 tablet by mouth daily as needed for pain.     Multiple Vitamins-Minerals (WOMENS MULTIVITAMIN PO) Take by mouth.     omeprazole (PRILOSEC) 20 MG capsule TAKE ONE CAPSULE BY MOUTH EVERYDAY AT BEDTIME 90 capsule 3   ondansetron (ZOFRAN) 4 MG tablet Take 1 tablet (4 mg total) by mouth every 8 (eight) hours as needed for nausea or vomiting. 15 tablet 0   pregabalin (LYRICA) 150 MG capsule Take 300 mg by mouth at bedtime.     rOPINIRole (REQUIP) 1 MG tablet TAKE 1/2 TABLET BY MOUTH EVERY MORNING and TAKE THREE TABLETS BY MOUTH EVERYDAY AT BEDTIME 315 tablet 3   Sodium Chloride (HYPERSAL)  3.5 % NEBU Take 1 each by nebulization 2 (two) times daily as needed. 240 mL 5   zinc gluconate 50 MG tablet Take 50 mg by mouth daily.     ALPRAZolam (XANAX) 0.5 MG tablet Take 1 tablet (0.5 mg total) by mouth 2 (two) times daily as needed for anxiety. 60 tablet 5   sertraline (ZOLOFT) 100 MG tablet Take 2 tablets (200 mg total) by mouth every morning. 180 tablet 1   Current Facility-Administered Medications on File Prior to Visit  Medication Dose Route Frequency Provider Last Rate Last Admin   influenza vaccine adjuvanted (FLUAD) injection 0.5 mL  0.5 mL Intramuscular Once Louretta Shorten R, MD       sodium chloride flush (NS) 0.9 % injection 10 mL  10 mL Intravenous PRN Earna Coder, MD   10 mL at 09/11/15 0845   Review of Systems  Constitutional:  Positive for malaise/fatigue. Negative for chills and fever.  HENT:  Negative for congestion, ear pain, sinus pain and sore throat.   Eyes:  Negative for blurred vision, discharge and redness.  Respiratory:  Negative for cough, shortness of breath and stridor.   Cardiovascular:  Negative for chest pain, palpitations and leg swelling.  Gastrointestinal:  Negative for abdominal pain, diarrhea, nausea and vomiting.  Musculoskeletal:  Positive for joint pain. Negative for myalgias.  Skin:  Negative for  rash.  Neurological:  Positive for tingling. Negative for dizziness, tremors, sensory change, speech change, focal weakness, seizures, loss of consciousness, weakness and headaches.       Involuntary jerking movements of limbs/none in head or face   Psychiatric/Behavioral:  Positive for depression. Negative for suicidal ideas. The patient is nervous/anxious.     Observations/Objective: Patient appears well, in no distress Weight is baseline  No facial swelling or asymmetry Normal voice-not hoarse and no slurred speech (her baseline is hoarseness)  No obvious tremor or mobility impairment Moving neck and UEs normally No obvious  jerking movements or facial ticks during visit  Able to hear the call well  No cough or shortness of breath during interview  Talkative and mentally sharp with no cognitive changes No skin changes on face or neck , no rash or pallor Affect is normal to mildly anxious    Assessment and Plan: Problem List Items Addressed This Visit       Endocrine   Controlled type 2 diabetes mellitus without complication (HCC)    Per pt ghe GLP meds made her sick (had inj and oral)  Is back on metformin and glipizide?   Under care of endocrinologist         Nervous and Auditory   Neuropathy associated with cancer (HCC)    Takes lyrica 300 mg daily  Last neuro visit was 2020           Other   B12 deficiency    Will check this at next visit       Depression with anxiety    Under care of psychiatry  Zoloft 200 mg daily  Buspar 15 mg 1.5 m bid  Elavil 50 mg at bedtime Xanax prn-? Not often   Discussed possible polypharmacy in light of her jerking limb movements Has follow up with psychiatry next month Also sees counselor  Stress is higher       Jerking - Primary    Jerking of arms /legs sporatically  (no facial ticks)  Will plan follow up in person for exam and possibly labs  In setting of high stress (had once before as well in stressful situation  No syncope  Has history of past orthostasis and pre syncope   Polypharmacy is a concern  On ssri , buspar, elavil, xanax Also requip for RLS and lyrica for neuropathy  Reviewed her last neuro visit from 2020 for orthostasis/neuropathy and CTS (Dr Allena Katz)   Also on ppi which can affect iron and vitamin levels   Will schedule in person appointment for this/ do exams and labs at the time      Polypharmacy    In pt with complex history incl dep/anx and RLS and neuropathy   Among others Zoloft 200 mg  Buspar 15 mg 1.5 mg bid  Elavil 50 mg at bedtime  Temazepam- not taking (was for sleep)  Xanax prn   Takes requip for RLS 1/2  tab in am and 3 at bedtime   Lyrica 300 mg daily  For neuropathy   Zofran is on list for nausea-not taking currently  On ppi    Plan follow up next wk for exam/lab and visit  May want to consider pharmacist help  Is having involuntary arm and leg jerking movements         Follow Up Instructions: I want to have you in for a visit /do exam and some labs to work up the limb jerking symptoms  I do wonder  if it is medicine related   If symptoms suddenly worsen in the meantime go to the ER  Keep an eye on your blood sugar    I discussed the assessment and treatment plan with the patient. The patient was provided an opportunity to ask questions and all were answered. The patient agreed with the plan and demonstrated an understanding of the instructions.   The patient was advised to call back or seek an in-person evaluation if the symptoms worsen or if the condition fails to improve as anticipated.     Roxy Manns, MD

## 2022-11-05 NOTE — Assessment & Plan Note (Signed)
In pt with complex history incl dep/anx and RLS and neuropathy   Among others Zoloft 200 mg  Buspar 15 mg 1.5 mg bid  Elavil 50 mg at bedtime  Temazepam- not taking (was for sleep)  Xanax prn   Takes requip for RLS 1/2 tab in am and 3 at bedtime   Lyrica 300 mg daily  For neuropathy   Zofran is on list for nausea-not taking currently  On ppi    Plan follow up next wk for exam/lab and visit  May want to consider pharmacist help  Is having involuntary arm and leg jerking movements

## 2022-11-05 NOTE — Assessment & Plan Note (Signed)
Will check this at next visit.

## 2022-11-05 NOTE — Patient Instructions (Signed)
I want to have you in for a visit /do exam and some labs to work up the limb jerking symptoms  I do wonder if it is medicine related   If symptoms suddenly worsen in the meantime go to the ER  Keep an eye on your blood sugar

## 2022-11-05 NOTE — Assessment & Plan Note (Addendum)
Jerking of arms /legs sporatically  (no facial ticks)  Will plan follow up in person for exam and possibly labs  In setting of high stress (had once before as well in stressful situation  No syncope  Has history of past orthostasis and pre syncope   Polypharmacy is a concern  On ssri , buspar, elavil, xanax Also requip for RLS and lyrica for neuropathy  Reviewed her last neuro visit from 2020 for orthostasis/neuropathy and CTS (Dr Allena Katz)   Also on ppi which can affect iron and vitamin levels   Will schedule in person appointment for this/ do exams and labs at the time

## 2022-11-05 NOTE — Assessment & Plan Note (Signed)
Under care of psychiatry  Zoloft 200 mg daily  Buspar 15 mg 1.5 m bid  Elavil 50 mg at bedtime Xanax prn-? Not often   Discussed possible polypharmacy in light of her jerking limb movements Has follow up with psychiatry next month Also sees counselor  Stress is higher

## 2022-11-05 NOTE — Assessment & Plan Note (Signed)
Takes lyrica 300 mg daily  Last neuro visit was 2020

## 2022-11-05 NOTE — Assessment & Plan Note (Signed)
Per pt ghe GLP meds made her sick (had inj and oral)  Is back on metformin and glipizide?   Under care of endocrinologist

## 2022-11-08 ENCOUNTER — Other Ambulatory Visit: Payer: Self-pay | Admitting: Psychiatry

## 2022-11-08 DIAGNOSIS — F411 Generalized anxiety disorder: Secondary | ICD-10-CM

## 2022-11-10 ENCOUNTER — Emergency Department
Admission: EM | Admit: 2022-11-10 | Discharge: 2022-11-10 | Disposition: A | Discharge status: Home / Self Care | Payer: HMO | Attending: Emergency Medicine | Admitting: Emergency Medicine

## 2022-11-10 ENCOUNTER — Ambulatory Visit: Payer: HMO | Admitting: Family Medicine

## 2022-11-10 ENCOUNTER — Telehealth: Payer: Self-pay

## 2022-11-10 ENCOUNTER — Encounter: Payer: Self-pay | Admitting: Family Medicine

## 2022-11-10 VITALS — BP 124/68 | HR 88 | Temp 98.0°F | Ht 66.5 in | Wt 192.0 lb

## 2022-11-10 DIAGNOSIS — I10 Essential (primary) hypertension: Secondary | ICD-10-CM

## 2022-11-10 DIAGNOSIS — D649 Anemia, unspecified: Secondary | ICD-10-CM | POA: Diagnosis not present

## 2022-11-10 DIAGNOSIS — C801 Malignant (primary) neoplasm, unspecified: Secondary | ICD-10-CM | POA: Diagnosis not present

## 2022-11-10 DIAGNOSIS — G63 Polyneuropathy in diseases classified elsewhere: Secondary | ICD-10-CM

## 2022-11-10 DIAGNOSIS — R2689 Other abnormalities of gait and mobility: Secondary | ICD-10-CM

## 2022-11-10 DIAGNOSIS — R4182 Altered mental status, unspecified: Secondary | ICD-10-CM

## 2022-11-10 DIAGNOSIS — R55 Syncope and collapse: Secondary | ICD-10-CM | POA: Diagnosis not present

## 2022-11-10 DIAGNOSIS — H539 Unspecified visual disturbance: Secondary | ICD-10-CM

## 2022-11-10 DIAGNOSIS — R799 Abnormal finding of blood chemistry, unspecified: Secondary | ICD-10-CM | POA: Diagnosis present

## 2022-11-10 DIAGNOSIS — Z79899 Other long term (current) drug therapy: Secondary | ICD-10-CM | POA: Diagnosis not present

## 2022-11-10 DIAGNOSIS — E119 Type 2 diabetes mellitus without complications: Secondary | ICD-10-CM | POA: Diagnosis not present

## 2022-11-10 DIAGNOSIS — F418 Other specified anxiety disorders: Secondary | ICD-10-CM

## 2022-11-10 DIAGNOSIS — D509 Iron deficiency anemia, unspecified: Secondary | ICD-10-CM

## 2022-11-10 DIAGNOSIS — E559 Vitamin D deficiency, unspecified: Secondary | ICD-10-CM

## 2022-11-10 DIAGNOSIS — F411 Generalized anxiety disorder: Secondary | ICD-10-CM

## 2022-11-10 DIAGNOSIS — R253 Fasciculation: Secondary | ICD-10-CM | POA: Diagnosis not present

## 2022-11-10 DIAGNOSIS — E538 Deficiency of other specified B group vitamins: Secondary | ICD-10-CM | POA: Diagnosis not present

## 2022-11-10 LAB — TYPE AND SCREEN
ABO/RH(D): O POS
Antibody Screen: NEGATIVE

## 2022-11-10 LAB — COMPREHENSIVE METABOLIC PANEL
ALT: 10 U/L (ref 0–35)
ALT: 14 U/L (ref 0–44)
AST: 15 U/L (ref 0–37)
AST: 20 U/L (ref 15–41)
Albumin: 3.3 g/dL — ABNORMAL LOW (ref 3.5–5.2)
Albumin: 3.1 g/dL — ABNORMAL LOW (ref 3.5–5.0)
Alkaline Phosphatase: 67 U/L (ref 39–117)
Alkaline Phosphatase: 66 U/L (ref 38–126)
Anion gap: 10 (ref 5–15)
BUN: 21 mg/dL (ref 6–23)
BUN: 20 mg/dL (ref 8–23)
CO2: 27 mEq/L (ref 19–32)
CO2: 24 mmol/L (ref 22–32)
Calcium: 7.3 mg/dL — ABNORMAL LOW (ref 8.4–10.5)
Calcium: 7.2 mg/dL — ABNORMAL LOW (ref 8.9–10.3)
Chloride: 109 mEq/L (ref 96–112)
Chloride: 106 mmol/L (ref 98–111)
Creatinine, Ser: 0.99 mg/dL (ref 0.40–1.20)
Creatinine, Ser: 0.91 mg/dL (ref 0.44–1.00)
GFR, Estimated: >60 mL/min (ref >60)
GFR: 56.01 mL/min — ABNORMAL LOW (ref >60.00)
Glucose, Bld: 103 mg/dL — ABNORMAL HIGH (ref 70–99)
Glucose, Bld: 137 mg/dL — ABNORMAL HIGH (ref 70–99)
Potassium: 4.6 mEq/L (ref 3.5–5.1)
Potassium: 4.7 mmol/L (ref 3.5–5.1)
Sodium: 144 mEq/L (ref 135–145)
Sodium: 140 mmol/L (ref 135–145)
Total Bilirubin: 0.3 mg/dL (ref 0.2–1.2)
Total Bilirubin: 0.5 mg/dL (ref 0.3–1.2)
Total Protein: 6.2 g/dL (ref 6.0–8.3)
Total Protein: 6.9 g/dL (ref 6.5–8.1)

## 2022-11-10 LAB — CBC WITH DIFFERENTIAL/PLATELET
Basophils Absolute: 0 10*3/uL (ref 0.0–0.1)
Basophils Relative: 0.3 % (ref 0.0–3.0)
Eosinophils Absolute: 0.3 10*3/uL (ref 0.0–0.7)
Eosinophils Relative: 3.6 % (ref 0.0–5.0)
HCT: 26.4 % — ABNORMAL LOW (ref 36.0–46.0)
Hemoglobin: 7.7 g/dL — CL (ref 12.0–15.0)
Lymphocytes Relative: 13.6 % (ref 12.0–46.0)
Lymphs Abs: 1 10*3/uL (ref 0.7–4.0)
MCHC: 29.3 g/dL — ABNORMAL LOW (ref 30.0–36.0)
MCV: 76.7 fl — ABNORMAL LOW (ref 78.0–100.0)
Monocytes Absolute: 0.4 10*3/uL (ref 0.1–1.0)
Monocytes Relative: 5.6 % (ref 3.0–12.0)
Neutro Abs: 5.6 10*3/uL (ref 1.4–7.7)
Neutrophils Relative %: 76.9 % (ref 43.0–77.0)
Platelets: 222 10*3/uL (ref 150.0–400.0)
RBC: 3.44 Mil/uL — ABNORMAL LOW (ref 3.87–5.11)
RDW: 20.8 % — ABNORMAL HIGH (ref 11.5–15.5)
WBC: 7.3 10*3/uL (ref 4.0–10.5)

## 2022-11-10 LAB — TSH: TSH: 2.45 u[IU]/mL (ref 0.35–5.50)

## 2022-11-10 LAB — CBC
HCT: 26.9 % — ABNORMAL LOW (ref 36.0–46.0)
Hemoglobin: 7.8 g/dL — ABNORMAL LOW (ref 12.0–15.0)
MCH: 23 pg — ABNORMAL LOW (ref 26.0–34.0)
MCHC: 29 g/dL — ABNORMAL LOW (ref 30.0–36.0)
MCV: 79.4 fL — ABNORMAL LOW (ref 80.0–100.0)
Platelets: 222 10*3/uL (ref 150–400)
RBC: 3.39 MIL/uL — ABNORMAL LOW (ref 3.87–5.11)
RDW: 20.2 % — ABNORMAL HIGH (ref 11.5–15.5)
WBC: 9.2 10*3/uL (ref 4.0–10.5)
nRBC: 0 % (ref 0.0–0.2)

## 2022-11-10 LAB — LIPID PANEL
Cholesterol: 123 mg/dL (ref 0–200)
HDL: 52.2 mg/dL (ref >39.00)
LDL Cholesterol: 32 mg/dL (ref 0–99)
NonHDL: 70.76
Total CHOL/HDL Ratio: 2
Triglycerides: 195 mg/dL — ABNORMAL HIGH (ref 0.0–149.0)
VLDL: 39 mg/dL (ref 0.0–40.0)

## 2022-11-10 LAB — MAGNESIUM: Magnesium: 1 mg/dL — CL (ref 1.5–2.5)

## 2022-11-10 LAB — IRON: Iron: 27 ug/dL — ABNORMAL LOW (ref 42–145)

## 2022-11-10 LAB — VITAMIN D 25 HYDROXY (VIT D DEFICIENCY, FRACTURES): VITD: 35.01 ng/mL (ref 30.00–100.00)

## 2022-11-10 LAB — VITAMIN B12: Vitamin B-12: 219 pg/mL (ref 211–911)

## 2022-11-10 MED ORDER — MAGNESIUM OXIDE 400 MG PO CAPS: 400.0000 mg | ORAL_CAPSULE | Freq: Two times a day (BID) | ORAL | 0 refills | Status: AC

## 2022-11-10 MED ORDER — MAGNESIUM SULFATE 4 GM/100ML IV SOLN
4.0000 g | Freq: Once | INTRAVENOUS | Status: DC
  Filled 2022-11-10: qty 100

## 2022-11-10 MED ORDER — MAGNESIUM OXIDE -MG SUPPLEMENT 400 (240 MG) MG PO TABS
800.0000 mg | ORAL_TABLET | Freq: Once | ORAL | Status: AC
  Administered 2022-11-10: 800 mg via ORAL
  Filled 2022-11-10: qty 2

## 2022-11-10 NOTE — Assessment & Plan Note (Signed)
Lately some new hallucinations/ possibly dellusions  Recently pharm would not fill zoloft due to drug interactions Polypharmacy is an issue -may be causing physical symptoms and ms changes  Encouraged her to reach out to her psychiatrist today and follow up as planned   She has good support

## 2022-11-10 NOTE — Assessment & Plan Note (Signed)
Mag level ordered

## 2022-11-10 NOTE — Assessment & Plan Note (Signed)
Lab today.

## 2022-11-10 NOTE — Progress Notes (Signed)
Subjective:    Patient ID: Suzanne Strong, female    DOB: [birth year], 77 y.o.   MRN: [id number]  HPI  Wt Readings from Last 3 Encounters:  11/10/22 192 lb (87.1 kg)  09/21/22 188 lb (85.3 kg)  08/18/22 212 lb 12.8 oz (96.5 kg)   30.53 kg/m  Vitals:   11/10/22 1020  BP: 124/68  Pulse: 88  Temp: 98 F (36.7 C)  SpO2: 93%   Pt presents for eval for neurol symptoms   Last visit discussed jerking movements  Arms and legs No facial ticks  In setting of high stress   Still has jerking 0-1 times per day  Usually happens when using that muscle- like picking something up   Making It difficult to check glucose or blood pressure    Since that visit -had one weak spell   Having a lot of falls   No syncope   Some weak spells and glucose was below 60    Last neuro visit Dr Allena Katz for orthostasis, neuropathy and CTS (Dr Allena Katz)    B12 def-due for labs  Lab Results  Component Value Date   VITAMINB12 208 12/11/2020    Depression and anxiety   Zoloft 200 mg daily  Buspar 15 mg 1.5 m bid  Elavil 50 mg at bedtime Xanax prn-? Not often   Takes lyrica 300 mg daily for neuropathy   Requip for RLS   She has had some hallucinations  Visual -not auditory - pt and person here with her (god daughter's mother)  Thought someone was in her house  Once thought police were there   Has psychiatrist -sees appointment on oct 1   Poor balance  More falls lately -tends to fall backwards      DM Lab Results  Component Value Date   HGBA1C 6.5 (H) 12/11/2020   Lab Results  Component Value Date   NA 133 (L) 07/21/2022   K 3.7 07/21/2022   CO2 26 07/21/2022   GLUCOSE 179 (H) 07/21/2022   BUN 29 (H) 07/21/2022   CREATININE 1.15 (H) 07/21/2022   CALCIUM 9.7 07/21/2022   GFR 47.03 (L) 02/12/2022   GFRNONAA 50 (L) 07/21/2022      Patient Active Problem List   Diagnosis Date Noted   Change in mental status 11/10/2022   Jerking 11/05/2022   Bronchiectasis  without complication (HCC) 07/31/2022   Diarrhea 07/22/2022   Lymphadenopathy 06/24/2022   Poor balance 04/28/2022   Vision changes 02/12/2022   Polypharmacy 02/12/2022   At high risk for aspiration 04/23/2021   COVID-19 03/18/2021   Recurrent aspiration pneumonia (HCC) 12/09/2020   Dysuria 07/17/2020   Dysphagia    Gastric erythema    Gastric polyp    Encounter for screening colonoscopy    Polyp of colon    History of aspiration pneumonia 02/15/2020   Diabetes mellitus without complication (HCC)    GERD (gastroesophageal reflux disease)    Hypertension    Urinary urgency 01/05/2020   Grief reaction 09/18/2019   Swallowing dysfunction 07/18/2019   Persistent cough for 3 weeks or longer 02/21/2019   Fever, intermittent 09/01/2018   Pre-syncope 02/07/2018   Orthostatic hypotension 01/26/2018   Episodic weakness 01/26/2018   Medicare annual wellness visit, subsequent 06/29/2017   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   Preoperative examination 04/27/2017   Carpal tunnel syndrome of right wrist 11/13/2016   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HCC) History of  02/17/2016   Obstructive sleep apnea 01/01/2016   Carcinoma of  upper-outer quadrant of left breast in female, estrogen receptor negative (HCC) 11/21/2015   Hypomagnesemia 08/28/2015   Fever 04/11/2015   Initial Medicare annual wellness visit 10/16/2014   Estrogen deficiency 10/16/2014   Colon cancer screening 10/16/2014   Controlled type 2 diabetes mellitus without complication (HCC) 03/28/2014   RLS (restless legs syndrome) 02/21/2014   Chronic neck pain 07/25/2010   Depression with anxiety 07/07/2010   Routine general medical examination at a health care facility 06/26/2010   B12 deficiency 09/04/2009   Anemia, iron deficiency 08/29/2009   Anxiety state 08/26/2009   GASTROPARESIS 08/26/2009   Vitamin D deficiency 07/19/2008   Disorder of bone and cartilage 07/19/2007   Hyperlipidemia 07/18/2007   Edema 07/18/2007    Past Medical History:  Diagnosis Date   Anemia    iron deficiency and B12 deficiency   Anxiety    Breast cancer (HCC) 02/25/2015   upper-outer quadrant of left female breast, Triple Negative. Neo-adjuvant chemo with complete pathologic response.    Cervical dysplasia    conization   Diabetes mellitus without complication (HCC)    Diverticulosis    Dyspnea    Fever 04/11/2015   Gastroparesis    related to previous radiation tx   GERD (gastroesophageal reflux disease)    Hyperlipidemia    Hypertension    Neuromuscular disorder (HCC)    Neuropathy    legs/feet, S/P chemo meds   Personal history of chemotherapy    Personal history of radiation therapy 2017   LEFT lumpectomy w/ radiation   Pneumonia    RLS (restless legs syndrome)    Shingles    Hx of   Sleep apnea    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HCC) 2006   Wears dentures    partial bottom   Past Surgical History:  Procedure Laterality Date   APPENDECTOMY     BREAST BIOPSY Left 02/25/2015   INVASIVE MAMMARY CARCINOMA,Triple negative.   BREAST CYST ASPIRATION     BREAST EXCISIONAL BIOPSY Left 12/2015   surgery   BREAST LUMPECTOMY WITH NEEDLE LOCALIZATION Left 10/02/2015   Procedure: BREAST LUMPECTOMY WITH NEEDLE LOCALIZATION;  Surgeon: Earline Mayotte, MD;  Location: [facility];  Service: General;  Laterality: Left;   BREAST LUMPECTOMY WITH SENTINEL LYMPH NODE BIOPSY Left 10/02/2015   Procedure: LEFT BREAST WIDE EXCISION WITH SENTINEL LYMPH NODE BX;  Surgeon: Earline Mayotte, MD;  Location: [facility];  Service: General;  Laterality: Left;   BREAST SURGERY     breast biopsy benign   CARPAL TUNNEL RELEASE Right 05/19/2017   Procedure: CARPAL TUNNEL RELEASE;  Surgeon: Deeann Saint, MD;  Location: [facility];  Service: Orthopedics;  Laterality: Right;   CATARACT EXTRACTION W/PHACO Right 10/26/2018   Procedure: CATARACT EXTRACTION PHACO AND INTRAOCULAR LENS PLACEMENT (IOC) right diabetic;  Surgeon: Lockie Mola, MD;   Location: [facility];  Service: Ophthalmology;  Laterality: Right;  Diabetic - oral meds cancer center been notified and will come   CATARACT EXTRACTION W/PHACO Left 11/16/2018   Procedure: CATARACT EXTRACTION PHACO AND INTRAOCULAR LENS PLACEMENT (IOC) LEFT DIABETIC  01:01.0  17.0%  10.37;  Surgeon: Lockie Mola, MD;  Location: [facility];  Service: Ophthalmology;  Laterality: Left;  Diabetic - oral meds Port-a-cath   CHOLECYSTECTOMY     Cologuard  07/22/2016   Negative   COLONOSCOPY WITH PROPOFOL N/A 05/24/2020   Procedure: COLONOSCOPY WITH PROPOFOL;  Surgeon: Pasty Spillers, MD;  Location: [facility];  Service: Endoscopy;  Laterality: N/A;   ESOPHAGOGASTRODUODENOSCOPY  07/2009  normal with few gastric polyps   ESOPHAGOGASTRODUODENOSCOPY (EGD) WITH PROPOFOL N/A 05/24/2020   Procedure: ESOPHAGOGASTRODUODENOSCOPY (EGD) WITH PROPOFOL;  Surgeon: Pasty Spillers, MD;  Location: [facility];  Service: Endoscopy;  Laterality: N/A;   FLEXIBLE BRONCHOSCOPY Bilateral 06/24/2022   Procedure: FLEXIBLE BRONCHOSCOPY;  Surgeon: [name], MD;  Location: [facility];  Service: Pulmonary;  Laterality: Bilateral;   MOLE REMOVAL     PORT-A-CATH REMOVAL     PORTACATH PLACEMENT     PORTACATH PLACEMENT Right 03/12/2015   Procedure: INSERTION PORT-A-CATH;  Surgeon: Earline Mayotte, MD;  Location: [facility];  Service: General;  Laterality: Right;   RADICAL NECK DISSECTION     TONSILLECTOMY Left    cancer treated with chemo and radiation   TRIGGER FINGER RELEASE Right 05/19/2017   Procedure: RELEASE TRIGGER FINGER/A-1 PULLEY ring finger;  Surgeon: Deeann Saint, MD;  Location: [facility];  Service: Orthopedics;  Laterality: Right;   VIDEO BRONCHOSCOPY WITH ENDOBRONCHIAL ULTRASOUND Bilateral 06/24/2022   Procedure: VIDEO BRONCHOSCOPY WITH ENDOBRONCHIAL ULTRASOUND;  Surgeon: [name], MD;  Location: [facility];  Service: Pulmonary;  Laterality: Bilateral;   Social  History   Tobacco Use   Smoking status: Former    Current packs/day: 0.00    Types: Cigarettes    Quit date: 1985    Years since quitting: 39.7   Smokeless tobacco: Never  Vaping Use   Vaping status: Never Used  Substance Use Topics   Alcohol use: No    Alcohol/week: 0.0 standard drinks of alcohol   Drug use: No   Family History  Problem Relation Age of Onset   Osteoporosis Mother    Hyperlipidemia Mother    Depression Mother    Cancer Mother        skin cancer ? basal cell and lung ca heavy smoker   Alcohol abuse Father    Cancer Father        skin CA ? basal cell   Heart disease Father        CHF   Hyperlipidemia Brother    Hypertension Brother    Breast cancer Neg Hx    Allergies  Allergen Reactions   Amoxicillin Swelling    REACTION: rash, swelling Has patient had a PCN reaction causing immediate rash, facial/tongue/throat swelling, SOB or lightheadedness with hypotension: yes Has patient had a PCN reaction causing severe rash involving mucus membranes or skin necrosis: no Has patient had a PCN reaction that required hospitalization: no Has patient had a PCN reaction occurring within the last 10 years: yes If all of the above answers are "NO", then may proceed with Cephalosporin use.  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     albuterol (PROVENTIL) (2.5 MG/3ML) 0.083% nebulizer solution Take 3 mLs (2.5 mg total) by nebulization in the morning and at bedtime. 180 mL 11   albuterol  (VENTOLIN HFA) 108 (90 Base) MCG/ACT inhaler Inhale 2 puffs into the lungs every 6 (six) hours as needed  for wheezing or shortness of breath. 8 g 3   ALPRAZolam (XANAX) 0.5 MG tablet Take 1 tablet by mouth twice daily as needed for anxiety 60 tablet 0   amitriptyline (ELAVIL) 50 MG tablet Take 1 tablet (50 mg total) by mouth at bedtime. 90 tablet 3   amLODipine (NORVASC) 5 MG tablet Take 5 mg by mouth daily.     atorvastatin (LIPITOR) 10 MG tablet TAKE ONE TABLET BY MOUTH ONCE DAILY 90 tablet 3   b complex vitamins tablet Take 1 tablet by mouth daily.     BIOTIN PO Take by mouth.     busPIRone (BUSPAR) 15 MG tablet Take 1.5 tabs po BID 270 tablet 1   Cholecalciferol (VITAMIN D-1000 MAX ST) 25 MCG (1000 UT) tablet Take 2,000 Units by mouth daily. Takes three 50 mcg tablets, 3 capsules daily     Emollient (COLLAGEN EX)      famotidine (PEPCID) 20 MG tablet Take 1 tablet by mouth twice daily 180 tablet 0   ferrous sulfate 325 (65 FE) MG tablet Take 1 tablet (325 mg total) by mouth 2 (two) times daily with a meal.     glipiZIDE (GLUCOTROL) 5 MG tablet Take 2 tablets (10 mg total) by mouth daily before breakfast.     glucose blood (ONETOUCH ULTRA) test strip USE TO check blood glucose ONCE DAILY AND AS NEEDED FOR DIABETES 100 strip 1   losartan (COZAAR) 25 MG tablet Take 1 tablet (25 mg total) by mouth daily. 30 tablet 1   meloxicam (MOBIC) 15 MG tablet Take 1 tablet by mouth daily as needed for pain.     metFORMIN (GLUCOPHAGE) 500 MG tablet Take 500 mg by mouth in the morning, at noon, in the evening, and at bedtime.     Multiple Vitamins-Minerals (WOMENS MULTIVITAMIN PO) Take by mouth.     omeprazole (PRILOSEC) 20 MG capsule TAKE ONE CAPSULE BY MOUTH EVERYDAY AT BEDTIME 90 capsule 3   pregabalin (LYRICA) 150 MG capsule Take 300 mg by mouth at bedtime.     rOPINIRole (REQUIP) 1 MG tablet TAKE 1/2 TABLET BY MOUTH EVERY MORNING and TAKE THREE TABLETS BY MOUTH EVERYDAY AT BEDTIME 315 tablet 3   Sodium  Chloride (HYPERSAL) 3.5 % NEBU Take 1 each by nebulization 2 (two) times daily as needed. 240 mL 5   zinc gluconate 50 MG tablet Take 50 mg by mouth daily.     sertraline (ZOLOFT) 100 MG tablet Take 2 tablets (200 mg total) by mouth every morning. (Patient not taking: Reported on 11/10/2022) 180 tablet 1   Current Facility-Administered Medications on File Prior to Visit  Medication Dose Route Frequency Provider Last Rate Last Admin   influenza vaccine adjuvanted (FLUAD) injection 0.5 mL  0.5 mL Intramuscular Once Louretta Shorten R, MD       sodium chloride flush (NS) 0.9 % injection 10 mL  10 mL Intravenous PRN Earna Coder, MD   10 mL at 09/11/15 0845    Review of Systems  Constitutional:  Positive for fatigue. Negative for activity change, appetite change, fever and unexpected weight change.       Occational fever on/off   HENT:  Positive for trouble swallowing. Negative for congestion, ear pain, rhinorrhea, sinus pressure and sore throat.   Eyes:  Negative for pain, redness and visual disturbance.  Respiratory:  Negative for cough, shortness of breath and wheezing.        No cough today  Cardiovascular:  Negative for chest pain  and palpitations.  Gastrointestinal:  Negative for abdominal pain, blood in stool, constipation and diarrhea.  Endocrine: Negative for polydipsia and polyuria.  Genitourinary:  Negative for dysuria, frequency and urgency.  Musculoskeletal:  Negative for arthralgias, back pain and myalgias.  Skin:  Negative for pallor and rash.  Allergic/Immunologic: Negative for environmental allergies.  Neurological:  Negative for dizziness, tremors, seizures, syncope, facial asymmetry, speech difficulty, weakness, light-headedness, numbness and headaches.       Jerking of arms/legs  Poor balance   Hematological:  Negative for adenopathy. Does not bruise/bleed easily.  Psychiatric/Behavioral:  Negative for decreased concentration, sleep disturbance and suicidal  ideas. The patient is nervous/anxious.        Objective:   Physical Exam Constitutional:      General: She is not in acute distress.    Appearance: Normal appearance. She is well-developed. She is obese. She is not ill-appearing or diaphoretic.  HENT:     Head: Normocephalic and atraumatic.     Right Ear: External ear normal.     Left Ear: External ear normal.     Nose: Nose normal.     Mouth/Throat:     Pharynx: No oropharyngeal exudate.  Eyes:     General: No scleral icterus.       Right eye: No discharge.        Left eye: No discharge.     Conjunctiva/sclera: Conjunctivae normal.     Pupils: Pupils are equal, round, and reactive to light.     Comments: No nystagmus  Neck:     Thyroid: No thyromegaly.     Vascular: No carotid bruit or JVD.     Trachea: No tracheal deviation.  Cardiovascular:     Rate and Rhythm: Normal rate and regular rhythm.     Heart sounds: Normal heart sounds. No murmur heard. Pulmonary:     Effort: Pulmonary effort is normal. No respiratory distress.     Breath sounds: Normal breath sounds. No wheezing or rales.  Abdominal:     General: Bowel sounds are normal. There is no distension.     Palpations: Abdomen is soft. There is no mass.     Tenderness: There is no abdominal tenderness.  Musculoskeletal:        General: No tenderness.     Cervical back: Full passive range of motion without pain, normal range of motion and neck supple.  Lymphadenopathy:     Cervical: No cervical adenopathy.  Skin:    General: Skin is warm and dry.     Coloration: Skin is not pale.     Findings: No rash.  Neurological:     Mental Status: She is alert and oriented to person, place, and time.     Cranial Nerves: No cranial nerve deficit.     Sensory: No sensory deficit.     Motor: No tremor, atrophy or abnormal muscle tone.     Coordination: Coordination normal.     Gait: Gait normal.     Deep Tendon Reflexes: Reflexes are normal and symmetric.     Comments: No  focal cerebellar signs   Psychiatric:        Attention and Perception: Attention normal.        Speech: Speech normal.        Behavior: Behavior normal.        Thought Content: Thought content normal. Thought content is not paranoid or delusional. Thought content does not include suicidal ideation.     Comments: Candidly discusses symptoms  and stressors   Seems to confuse easily   No hallucinations/delusions in office today            Assessment & Plan:   Problem List Items Addressed This Visit       Cardiovascular and Mediastinum   Hypertension    BP: 124/68  Stable today  Losartan 25 mg daily       Relevant Medications   amLODipine (NORVASC) 5 MG tablet   Other Relevant Orders   TSH   Lipid panel   Comprehensive metabolic panel   CBC with Differential/Platelet   Pre-syncope    This seems (per pt) to correlate to hypoglycemia  Encouraged regular meals She will reach out to endocrinologist       Relevant Medications   amLODipine (NORVASC) 5 MG tablet     Endocrine   Controlled type 2 diabetes mellitus without complication (HCC)    Pt has had some hypoglycemia causing episodic weakness  Encouraged her strongly to touch base with endocrinology and follow up for appt      Relevant Medications   metFORMIN (GLUCOPHAGE) 500 MG tablet     Nervous and Auditory   Neuropathy associated with cancer (HCC)    Takes lyrica 300 mg at bedtime  This can sedate  Some concern about polypharmacy   Labs today        Other   Anemia, iron deficiency    Lab today      Relevant Orders   Iron   B12 deficiency    Lab today  Some mental status changes and neuropathy        Relevant Orders   Vitamin B12   Change in mental status    Per friend today-occational seems confused Has significant mental health issues (dep/anx) with occational hallucinations (? Delusion) Baseline today   Labs ordered  Polypharmacy suspected  May consider re visit to neuro       Depression with anxiety    Lately some new hallucinations/ possibly dellusions  Recently pharm would not fill zoloft due to drug interactions Polypharmacy is an issue -may be causing physical symptoms and ms changes  Encouraged her to reach out to her psychiatrist today and follow up as planned   She has good support       Hypomagnesemia    Mag level ordered      Relevant Orders   Magnesium   Jerking - Primary    Intermittent limb jerking with intenstion  No facial tics Not obs in office at all   Lab today  Polypharmacy may play a role  Will check in with psychiatrist  Consider neuro visit       Polypharmacy    On lyrica and requip and multiple psycn medications ? If adding to ms change/ poor balance and c/o jerking muscles at times  Reassuring exam Lab today   Planning follow up with psychiatry   May need dec in lyrica and requip also       Poor balance    ? Polypharmacy  Lab today      Vision changes    Encouraged oph follow up  She thinks double vision (occational) was caused by cataract surgery        Vitamin D deficiency    Lab today      Relevant Orders   VITAMIN D 25 Hydroxy (Vit-D Deficiency, Fractures)

## 2022-11-10 NOTE — Assessment & Plan Note (Signed)
Takes lyrica 300 mg at bedtime  This can sedate  Some concern about polypharmacy   Labs today

## 2022-11-10 NOTE — Assessment & Plan Note (Signed)
Intermittent limb jerking with intenstion  No facial tics Not obs in office at all   Lab today  Polypharmacy may play a role  Will check in with psychiatrist  Consider neuro visit

## 2022-11-10 NOTE — Telephone Encounter (Signed)
Suzanne Strong with [facility] lab called critical lab report low magnesium 1.0.This is in Epic. will put in lab notebook and will send to Dr Milinda Antis, [employer] pool and will teams Shapale CMA.

## 2022-11-10 NOTE — Assessment & Plan Note (Signed)
This seems (per pt) to correlate to hypoglycemia  Encouraged regular meals She will reach out to endocrinologist

## 2022-11-10 NOTE — Assessment & Plan Note (Signed)
Per friend today-occational seems confused Has significant mental health issues (dep/anx) with occational hallucinations (? Delusion) Baseline today   Labs ordered  Polypharmacy suspected  May consider re visit to neuro

## 2022-11-10 NOTE — Telephone Encounter (Signed)
Hope with [facility] called critical lab report low hgb 7.7.  will put in lab notebook also. [place] note to Dr Rubbie Battiest pool and will teams Shapale CMA.

## 2022-11-10 NOTE — Assessment & Plan Note (Signed)
On lyrica and requip and multiple psycn medications ? If adding to ms change/ poor balance and c/o jerking muscles at times  Reassuring exam Lab today   Planning follow up with psychiatry   May need dec in lyrica and requip also

## 2022-11-10 NOTE — ED Provider Notes (Signed)
[facility] Provider Note    Event Date/Time   First MD Initiated Contact with Patient 11/10/22 1808     (approximate)   History   Abnormal Lab   HPI  Suzanne Strong is a 77 year old female with history of iron deficiency anemia, T2DM, hypomagnesemia, HTN presenting to the emergency department for evaluation of abnormal labs.  Patient reports she went for a routine wellness visit to her physician earlier today.  They had labs drawn and she was notified that she had critical lab work with a hemoglobin of 7.7. She does have a history of chronic anemia with what appears to be a baseline hemoglobin of 9-10.  She denies any identifiable bleeding sources.  No nosebleeds, hematuria, blood in stool, recent trauma.    On review of her records, she was also noted to have critically low magnesium at 1.  Patient reports a history of this, unknown cause, not currently taking magnesium.  Her primary care visit note from today documents near syncopal episodes.  Patient tells me that she is not actually lightheaded during these episodes she just gets weak in her legs.  Says that these have been unchanged for many months.  She denies chest pain, shortness of breath, dizziness to me.    Physical Exam   Triage Vital Signs: ED Triage Vitals [11/10/22 1635]  Encounter Vitals Group     BP 139/67     Systolic BP Percentile      Diastolic BP Percentile      Pulse Rate 95     Resp 14     Temp 98.4 F (36.9 C)     Temp Source Oral     SpO2 99 %     Weight      Height      Head Circumference      Peak Flow      Pain Score 0     Pain Loc      Pain Education      Exclude from Growth Chart     Most recent vital signs: Vitals:   11/10/22 1635  BP: 139/67  Pulse: 95  Resp: 14  Temp: 98.4 F (36.9 C)  SpO2: 99%     General: Awake, interactive  CV:  Regular rate, good peripheral perfusion.  Resp:  Lungs clear, unlabored respirations.  Abd:  Soft, nondistended,  nontender to palpation, brown stool, Hemoccult negative Neuro:  Symmetric facial movement, fluid speech, moving all extremity spontaneously and equally   ED Results / Procedures / Treatments   Labs (all labs ordered are listed, but only abnormal results are displayed) Labs Reviewed  COMPREHENSIVE METABOLIC PANEL - Abnormal; Notable for the following components:      Result Value   Glucose, Bld 137 (*)    Calcium 7.2 (*)    Albumin 3.1 (*)    All other components within normal limits  CBC - Abnormal; Notable for the following components:   RBC 3.39 (*)    Hemoglobin 7.8 (*)    HCT 26.9 (*)    MCV 79.4 (*)    MCH 23.0 (*)    MCHC 29.0 (*)    RDW 20.2 (*)    All other components within normal limits  POC OCCULT BLOOD, ED - Normal  TYPE AND SCREEN     EKG EKG independently reviewed interpreted by myself (ER attending) demonstrates:    RADIOLOGY Imaging independently reviewed and interpreted by myself demonstrates:    PROCEDURES:  Critical  Care performed: No  Procedures   MEDICATIONS ORDERED IN ED: Medications  magnesium oxide (MAG-OX) tablet 800 mg (has no administration in time range)     IMPRESSION / MDM / ASSESSMENT AND PLAN / ED COURSE  I reviewed the triage vital signs and the nursing notes.  Differential diagnosis includes, but is not limited to, acute blood loss anemia, anemia due to iron deficiency, decreased production, electrolyte abnormality  Patient's presentation is most consistent with acute presentation with potential threat to life or bodily function.  77 year old female presenting with anemia and hypomagnesemia on outpatient labs.  Patient arrives here that she is asymptomatic.  Her labs here redemonstrate anemia with hemoglobin of 7.8, down from her baseline around 9-10.  Her CMP is unremarkable.  I was able to review her magnesium level as an outpatient which was 1.  She has no identifiable bleeding sources, negative Hemoccult exam here.  With  her degree of hypomagnesemia, I did recommend that she at least stay in the ER for IV repletion.  She declines this saying that she is asymptomatic.  We did discuss that hypomagnesemia can cause arrhythmias and be life-threatening.  She demonstrates decision-making capacity. She is agreeable to taking oral repletion here and being discharged with a prescription.  She understands that this is AGAINST MEDICAL ADVICE and would still like to be discharged.  I did review this with the pharmacy.  They did report that patient could receive 800mg  of magnesium oxide here in the ER but due to variable absorption did not have a specific recommendation for for duration to take this.  Will plan to DC AMA with magnesium oxide twice daily for 3 days and have her follow-up closely as an outpatient for repeat labs.  The patient wants leave against medical advise. I spoke with the patient extensively regarding the risks of leaving prior to completion of an appropriate workup and interventions given their symptomatology. We discussed possibility of significant morbidity and death that may occur as a result of them leaving. The patient expressed understanding of the situation and items mentioned, however would like to leave our care.   I have determined that the patient has the capacity for this medical decision. I have discussed the risks and benefits of the proposed treatment and treatment alternatives including non-treatment. I have offered an open invitation to the patient to return at any time for care. I have determined that the patient understands this discussion and that the patient willingly assumes the potential risk to their well-being as a result of this informed refusal.     FINAL CLINICAL IMPRESSION(S) / ED DIAGNOSES   Final diagnoses:  Acute on chronic anemia  Hypomagnesemia     Rx / DC Orders   ED Discharge Orders     None        Note:  This document was prepared using Dragon voice recognition  software and may include unintentional dictation errors.   Trinna Post, MD 11/10/22 2032

## 2022-11-10 NOTE — ED Triage Notes (Signed)
Pt states that earlier today she was being seen at PCP's office for a wellness check and to talk about her concern regarding ongoing near syncopal episodes. Pt states that she was found to be anemic with a Hgb of 7.7. Pt denies any blood thinners, no hematochezia, melena, or other unspecified bleeding. Pt denies SOB, dizziness.

## 2022-11-10 NOTE — Assessment & Plan Note (Signed)
?   Polypharmacy  Lab today

## 2022-11-10 NOTE — Assessment & Plan Note (Signed)
Pt has had some hypoglycemia causing episodic weakness  Encouraged her strongly to touch base with endocrinology and follow up for appt

## 2022-11-10 NOTE — Assessment & Plan Note (Signed)
Lab today.

## 2022-11-10 NOTE — Patient Instructions (Addendum)
Check in with your psychiatrist  Let them know about the zoloft situation and also the hallucinations, ? Delusions, falls /balance problems and jerking   Also let your endocrinologist know about the low blood sugars and get your follow up appointment   Let's get labs today   I may want you to see neurologist again   Be careful not to fall Use your walker

## 2022-11-10 NOTE — Assessment & Plan Note (Signed)
Encouraged oph follow up  She thinks double vision (occational) was caused by cataract surgery

## 2022-11-10 NOTE — ED Notes (Signed)
Pt sent to ED by primary care for low HGB. Pt states she has no symptoms of low HGB. Pt has no other needs at this time.

## 2022-11-10 NOTE — Assessment & Plan Note (Signed)
BP: 124/68  Stable today  Losartan 25 mg daily

## 2022-11-10 NOTE — Discharge Instructions (Addendum)
You were seen in the ER today for abnormal labs.  We did recommend that you stay for repletion of your magnesium, but as you have chosen to leave against medical advice, I have sent a prescription for magnesium pills to your pharmacy.  Please take these twice a day for the next 3 days.  Follow-up closely with your primary care doctor to have your labs repeated.  You are welcome to return to the ER at any time to continue your evaluation.  Please return immediately for any new or concerning symptoms.

## 2022-11-10 NOTE — Assessment & Plan Note (Signed)
Lab today  Some mental status changes and neuropathy

## 2022-11-12 ENCOUNTER — Telehealth: Payer: Self-pay | Admitting: *Deleted

## 2022-11-12 NOTE — Telephone Encounter (Signed)
Oncology is with in [facility] so will route message to them so they are aware (please see ER notes and PCP's recent labs)  Called pt and advised of Dr. Royden Purl comments. Pt said they did recommend IV infusion and blood transfusion but told her it was going to take 4-5 hrs and pt didn't want to stay. F/u appt scheduled with PCP and she will call oncologist also

## 2022-11-12 NOTE — Telephone Encounter (Signed)
Dr. Milinda Antis sent me a message saying:  Tower, Suzanne Gallus, MD  [employer] Since she left the ER AMA I do need to see her back to re check the magnesium level / this is worrisome  Also she needs to see her hematologist/oncologist asap for the severe anemia Please send a copy of latest labs and also ER notes to her heme /onc and ask her to call for appointment

## 2022-11-13 ENCOUNTER — Telehealth: Payer: Self-pay | Admitting: Pharmacist

## 2022-11-13 ENCOUNTER — Ambulatory Visit (INDEPENDENT_AMBULATORY_CARE_PROVIDER_SITE_OTHER): Payer: HMO | Admitting: Family Medicine

## 2022-11-13 ENCOUNTER — Other Ambulatory Visit: Payer: Self-pay

## 2022-11-13 ENCOUNTER — Encounter: Payer: Self-pay | Admitting: Internal Medicine

## 2022-11-13 VITALS — BP 144/78 | HR 94 | Temp 98.0°F | Ht 66.5 in | Wt 195.0 lb

## 2022-11-13 DIAGNOSIS — R253 Fasciculation: Secondary | ICD-10-CM

## 2022-11-13 DIAGNOSIS — F418 Other specified anxiety disorders: Secondary | ICD-10-CM

## 2022-11-13 DIAGNOSIS — G2581 Restless legs syndrome: Secondary | ICD-10-CM | POA: Diagnosis not present

## 2022-11-13 DIAGNOSIS — R4182 Altered mental status, unspecified: Secondary | ICD-10-CM | POA: Diagnosis not present

## 2022-11-13 DIAGNOSIS — I10 Essential (primary) hypertension: Secondary | ICD-10-CM | POA: Diagnosis not present

## 2022-11-13 DIAGNOSIS — D509 Iron deficiency anemia, unspecified: Secondary | ICD-10-CM | POA: Diagnosis not present

## 2022-11-13 DIAGNOSIS — E538 Deficiency of other specified B group vitamins: Secondary | ICD-10-CM | POA: Diagnosis not present

## 2022-11-13 DIAGNOSIS — C50412 Malignant neoplasm of upper-outer quadrant of left female breast: Secondary | ICD-10-CM

## 2022-11-13 DIAGNOSIS — E559 Vitamin D deficiency, unspecified: Secondary | ICD-10-CM

## 2022-11-13 NOTE — Patient Instructions (Addendum)
Go to [facility] to get your mag-ox  When the magnesium level improves the jerking should improve and your restless legs should improve also   Let's re check labs today  And make a plan   See your heme/onc provider on Tuesday  You may need IV iron or a transfusion or both   See your psychiatrist as planned also   Please take your medicines as directed -do not deviate from that due to convenience

## 2022-11-13 NOTE — Telephone Encounter (Signed)
Pharmacy Quality Measure Review  This patient is appearing on a report for being at risk of failing the adherence measure for cholesterol (statin) medications this calendar year.   Medication: atorvastatin 10 mg Last fill date: 06/08/22 for 90 day supply  Epic reports last filled 09/03/22 though insurance report lists April as last fill date.  Called pharmacy who confirmed last fill date of 06/08/22. Must have been filled but not picked up.   Called Suzanne Strong who did not realize that she was not taking the atorvastatin. She is agreeable to calling her pharmacy to have atorvastatin refilled.   No further action needed.

## 2022-11-13 NOTE — Progress Notes (Unsigned)
Subjective:    Patient ID: Suzanne Strong, female    DOB: Jul 09, 1945, 77 y.o.   MRN: [id number]  HPI  Wt Readings from Last 3 Encounters:  11/13/22 195 lb (88.5 kg)  11/10/22 192 lb (87.1 kg)  09/21/22 188 lb (85.3 kg)   31.00 kg/m  Vitals:   11/13/22 1526  BP: (!) 144/78  Pulse: 94  Temp: 98 F (36.7 C)  SpO2: 96%    Pt presents for follow up of ER visit (anemia and low mag)   Lab Results  Component Value Date   WBC 9.2 11/10/2022   HGB 7.8 (L) 11/10/2022   HCT 26.9 (L) 11/10/2022   MCV 79.4 (L) 11/10/2022   PLT 222 11/10/2022   Lab Results  Component Value Date   IRON 27 (L) 11/10/2022   TIBC 252 12/11/2020   FERRITIN 187 12/11/2020    Neg hemoccult exam   Magnesium level of 1  Recommended she stay in ER for IV mag to get this up  She left AMA unfortunately  Was given 800 mag ox in ER  (it did not give her diarrhea)  Planned on bid for 3 d  400 mg bid   The pharmacy was out of it - - is now ready to pick up now    Last visit here discussed some MS changes Encouraged pt to reach out to her psychiatrist about polypharmacy  History of iron def Takes ferrous sulfate 325 mg bid   Heme /onc -has appointment on Tuesday     Neuropathy  Lab Results  Component Value Date   VITAMINB12 219 11/10/2022   Vit D def Last vitamin D Lab Results  Component Value Date   VD25OH 35.01 11/10/2022     Patient Active Problem List   Diagnosis Date Noted   Change in mental status 11/10/2022   Jerking 11/05/2022   Bronchiectasis without complication (HCC) 07/31/2022   Diarrhea 07/22/2022   Lymphadenopathy 06/24/2022   Poor balance 04/28/2022   Vision changes 02/12/2022   Polypharmacy 02/12/2022   At high risk for aspiration 04/23/2021   COVID-19 03/18/2021   Recurrent aspiration pneumonia (HCC) 12/09/2020   Dysuria 07/17/2020   Dysphagia    Gastric erythema    Gastric polyp    Encounter for screening colonoscopy    Polyp of colon    History of  aspiration pneumonia 02/15/2020   Diabetes mellitus without complication (HCC)    GERD (gastroesophageal reflux disease)    Hypertension    Urinary urgency 01/05/2020   Grief reaction 09/18/2019   Swallowing dysfunction 07/18/2019   Persistent cough for 3 weeks or longer 02/21/2019   Fever, intermittent 09/01/2018   Pre-syncope 02/07/2018   Orthostatic hypotension 01/26/2018   Episodic weakness 01/26/2018   Medicare annual wellness visit, subsequent 06/29/2017   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   Preoperative examination 04/27/2017   Carpal tunnel syndrome of right wrist 11/13/2016   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HCC) History of  02/17/2016   Obstructive sleep apnea 01/01/2016   Carcinoma of upper-outer quadrant of left breast in female, estrogen receptor negative (HCC) 11/21/2015   Hypomagnesemia 08/28/2015   Fever 04/11/2015   Initial Medicare annual wellness visit 10/16/2014   Estrogen deficiency 10/16/2014   Colon cancer screening 10/16/2014   Controlled type 2 diabetes mellitus without complication (HCC) 03/28/2014   RLS (restless legs syndrome) 02/21/2014   Chronic neck pain 07/25/2010   Depression with anxiety 07/07/2010   Routine general medical examination at a health care  facility 06/26/2010   B12 deficiency 09/04/2009   Anemia, iron deficiency 08/29/2009   Anxiety state 08/26/2009   GASTROPARESIS 08/26/2009   Vitamin D deficiency 07/19/2008   Disorder of bone and cartilage 07/19/2007   Hyperlipidemia 07/18/2007   Edema 07/18/2007   Past Medical History:  Diagnosis Date   Anemia    iron deficiency and B12 deficiency   Anxiety    Breast cancer (HCC) 02/25/2015   upper-outer quadrant of left female breast, Triple Negative. Neo-adjuvant chemo with complete pathologic response.    Cervical dysplasia    conization   Diabetes mellitus without complication (HCC)    Diverticulosis    Dyspnea    Fever 04/11/2015   Gastroparesis    related to previous  radiation tx   GERD (gastroesophageal reflux disease)    Hyperlipidemia    Hypertension    Neuromuscular disorder (HCC)    Neuropathy    legs/feet, S/P chemo meds   Personal history of chemotherapy    Personal history of radiation therapy 2017   LEFT lumpectomy w/ radiation   Pneumonia    RLS (restless legs syndrome)    Shingles    Hx of   Sleep apnea    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HCC) 2006   Wears dentures    partial bottom   Past Surgical History:  Procedure Laterality Date   APPENDECTOMY     BREAST BIOPSY Left 02/25/2015   INVASIVE MAMMARY CARCINOMA,Triple negative.   BREAST CYST ASPIRATION     BREAST EXCISIONAL BIOPSY Left 12/2015   surgery   BREAST LUMPECTOMY WITH NEEDLE LOCALIZATION Left 10/02/2015   Procedure: BREAST LUMPECTOMY WITH NEEDLE LOCALIZATION;  Surgeon: Earline Mayotte, MD;  Location: [facility];  Service: General;  Laterality: Left;   BREAST LUMPECTOMY WITH SENTINEL LYMPH NODE BIOPSY Left 10/02/2015   Procedure: LEFT BREAST WIDE EXCISION WITH SENTINEL LYMPH NODE BX;  Surgeon: Earline Mayotte, MD;  Location: [facility];  Service: General;  Laterality: Left;   BREAST SURGERY     breast biopsy benign   CARPAL TUNNEL RELEASE Right 05/19/2017   Procedure: CARPAL TUNNEL RELEASE;  Surgeon: Deeann Saint, MD;  Location: [facility];  Service: Orthopedics;  Laterality: Right;   CATARACT EXTRACTION W/PHACO Right 10/26/2018   Procedure: CATARACT EXTRACTION PHACO AND INTRAOCULAR LENS PLACEMENT (IOC) right diabetic;  Surgeon: Lockie Mola, MD;  Location: [facility];  Service: Ophthalmology;  Laterality: Right;  Diabetic - oral meds cancer center been notified and will come   CATARACT EXTRACTION W/PHACO Left 11/16/2018   Procedure: CATARACT EXTRACTION PHACO AND INTRAOCULAR LENS PLACEMENT (IOC) LEFT DIABETIC  01:01.0  17.0%  10.37;  Surgeon: Lockie Mola, MD;  Location: [facility];  Service: Ophthalmology;  Laterality: Left;  Diabetic - oral  meds Port-a-cath   CHOLECYSTECTOMY     Cologuard  07/22/2016   Negative   COLONOSCOPY WITH PROPOFOL N/A 05/24/2020   Procedure: COLONOSCOPY WITH PROPOFOL;  Surgeon: Pasty Spillers, MD;  Location: [facility];  Service: Endoscopy;  Laterality: N/A;   ESOPHAGOGASTRODUODENOSCOPY  07/2009   normal with few gastric polyps   ESOPHAGOGASTRODUODENOSCOPY (EGD) WITH PROPOFOL N/A 05/24/2020   Procedure: ESOPHAGOGASTRODUODENOSCOPY (EGD) WITH PROPOFOL;  Surgeon: Pasty Spillers, MD;  Location: [facility];  Service: Endoscopy;  Laterality: N/A;   FLEXIBLE BRONCHOSCOPY Bilateral 06/24/2022   Procedure: FLEXIBLE BRONCHOSCOPY;  Surgeon: [name], MD;  Location: [facility];  Service: Pulmonary;  Laterality: Bilateral;   MOLE REMOVAL     PORT-A-CATH REMOVAL     PORTACATH PLACEMENT  PORTACATH PLACEMENT Right 03/12/2015   Procedure: INSERTION PORT-A-CATH;  Surgeon: Earline Mayotte, MD;  Location: [facility];  Service: General;  Laterality: Right;   RADICAL NECK DISSECTION     TONSILLECTOMY Left    cancer treated with chemo and radiation   TRIGGER FINGER RELEASE Right 05/19/2017   Procedure: RELEASE TRIGGER FINGER/A-1 PULLEY ring finger;  Surgeon: Deeann Saint, MD;  Location: [facility];  Service: Orthopedics;  Laterality: Right;   VIDEO BRONCHOSCOPY WITH ENDOBRONCHIAL ULTRASOUND Bilateral 06/24/2022   Procedure: VIDEO BRONCHOSCOPY WITH ENDOBRONCHIAL ULTRASOUND;  Surgeon: [name], MD;  Location: [facility];  Service: Pulmonary;  Laterality: Bilateral;   Social History   Tobacco Use   Smoking status: Former    Current packs/day: 0.00    Types: Cigarettes    Quit date: 1985    Years since quitting: 39.7   Smokeless tobacco: Never  Vaping Use   Vaping status: Never Used  Substance Use Topics   Alcohol use: No    Alcohol/week: 0.0 standard drinks of alcohol   Drug use: No   Family History  Problem Relation Age of Onset   Osteoporosis Mother    Hyperlipidemia Mother     Depression Mother    Cancer Mother        skin cancer ? basal cell and lung ca heavy smoker   Alcohol abuse Father    Cancer Father        skin CA ? basal cell   Heart disease Father        CHF   Hyperlipidemia Brother    Hypertension Brother    Breast cancer Neg Hx    Allergies  Allergen Reactions   Amoxicillin Swelling    REACTION: rash, swelling Has patient had a PCN reaction causing immediate rash, facial/tongue/throat swelling, SOB or lightheadedness with hypotension: yes Has patient had a PCN reaction causing severe rash involving mucus membranes or skin necrosis: no Has patient had a PCN reaction that required hospitalization: no Has patient had a PCN reaction occurring within the last 10 years: yes If all of the above answers are "NO", then may proceed with Cephalosporin use.  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     albuterol (PROVENTIL) (2.5 MG/3ML) 0.083% nebulizer solution Take 3 mLs (2.5 mg total) by nebulization in the morning and at bedtime. 180 mL 11   albuterol (VENTOLIN HFA) 108 (90 Base) MCG/ACT inhaler Inhale 2 puffs into the lungs every 6 (six) hours as needed for wheezing or shortness of breath. 8 g 3   ALPRAZolam (XANAX) 0.5 MG tablet Take 1 tablet by mouth twice daily as needed for anxiety 60 tablet 0   amitriptyline (ELAVIL) 50 MG tablet Take 1 tablet (50 mg total) by mouth at bedtime. 90 tablet 3   amLODipine (NORVASC) 5 MG tablet Take 5 mg by mouth daily.     atorvastatin  (LIPITOR) 10 MG tablet TAKE ONE TABLET BY MOUTH ONCE DAILY 90 tablet 3  b complex vitamins tablet Take 1 tablet by mouth daily.     BIOTIN PO Take by mouth.     busPIRone (BUSPAR) 15 MG tablet Take 1.5 tabs po BID 270 tablet 1   Cholecalciferol (VITAMIN D-1000 MAX ST) 25 MCG (1000 UT) tablet Take 2,000 Units by mouth daily. Takes three 50 mcg tablets, 3 capsules daily     Emollient (COLLAGEN EX)      famotidine (PEPCID) 20 MG tablet Take 1 tablet by mouth twice daily 180 tablet 0   ferrous sulfate 325 (65 FE) MG tablet Take 1 tablet (325 mg total) by mouth 2 (two) times daily with a meal.     glipiZIDE (GLUCOTROL) 5 MG tablet Take 2 tablets (10 mg total) by mouth daily before breakfast.     glucose blood (ONETOUCH ULTRA) test strip USE TO check blood glucose ONCE DAILY AND AS NEEDED FOR DIABETES 100 strip 1   losartan (COZAAR) 25 MG tablet Take 1 tablet (25 mg total) by mouth daily. 30 tablet 1   Magnesium Oxide 400 MG CAPS Take 1 capsule (400 mg total) by mouth in the morning and at bedtime for 3 days. 6 capsule 0   meloxicam (MOBIC) 15 MG tablet Take 1 tablet by mouth daily as needed for pain.     metFORMIN (GLUCOPHAGE) 500 MG tablet Take 500 mg by mouth in the morning, at noon, in the evening, and at bedtime.     Multiple Vitamins-Minerals (WOMENS MULTIVITAMIN PO) Take by mouth.     omeprazole (PRILOSEC) 20 MG capsule TAKE ONE CAPSULE BY MOUTH EVERYDAY AT BEDTIME 90 capsule 3   pregabalin (LYRICA) 150 MG capsule Take 300 mg by mouth at bedtime.     rOPINIRole (REQUIP) 1 MG tablet TAKE 1/2 TABLET BY MOUTH EVERY MORNING and TAKE THREE TABLETS BY MOUTH EVERYDAY AT BEDTIME 315 tablet 3   sertraline (ZOLOFT) 100 MG tablet Take 2 tablets (200 mg total) by mouth every morning. 180 tablet 1   Sodium Chloride (HYPERSAL) 3.5 % NEBU Take 1 each by nebulization 2 (two) times daily as needed. 240 mL 5   zinc gluconate 50 MG tablet Take 50 mg by mouth daily.     Current Facility-Administered Medications  on File Prior to Visit  Medication Dose Route Frequency Provider Last Rate Last Admin   influenza vaccine adjuvanted (FLUAD) injection 0.5 mL  0.5 mL Intramuscular Once Louretta Shorten R, MD       sodium chloride flush (NS) 0.9 % injection 10 mL  10 mL Intravenous PRN Earna Coder, MD   10 mL at 09/11/15 0845    Review of Systems     Objective:   Physical Exam        Assessment & Plan:   Problem List Items Addressed This Visit   None

## 2022-11-13 NOTE — Telephone Encounter (Signed)
Labs entered.

## 2022-11-14 ENCOUNTER — Telehealth: Payer: Self-pay | Admitting: Internal Medicine

## 2022-11-14 LAB — MAGNESIUM: Magnesium: 1 mg/dL — CL (ref 1.5–2.5)

## 2022-11-14 LAB — CBC WITH DIFFERENTIAL/PLATELET
Absolute Monocytes: 336 {cells}/uL (ref 200–950)
Basophils Absolute: 31 {cells}/uL (ref 0–200)
Basophils Relative: 0.5 %
Eosinophils Absolute: 232 {cells}/uL (ref 15–500)
Eosinophils Relative: 3.8 %
HCT: 25.3 % — ABNORMAL LOW (ref 35.0–45.0)
Hemoglobin: 7.4 g/dL — ABNORMAL LOW (ref 11.7–15.5)
Lymphs Abs: 1190 {cells}/uL (ref 850–3900)
MCH: 22.5 pg — ABNORMAL LOW (ref 27.0–33.0)
MCHC: 29.2 g/dL — ABNORMAL LOW (ref 32.0–36.0)
MCV: 76.9 fL — ABNORMAL LOW (ref 80.0–100.0)
MPV: 10.4 fL (ref 7.5–12.5)
Monocytes Relative: 5.5 %
Neutro Abs: 4313 {cells}/uL (ref 1500–7800)
Neutrophils Relative %: 70.7 %
Platelets: 209 10*3/uL (ref 140–400)
RBC: 3.29 10*6/uL — ABNORMAL LOW (ref 3.80–5.10)
RDW: 18.1 % — ABNORMAL HIGH (ref 11.0–15.0)
Total Lymphocyte: 19.5 %
WBC: 6.1 10*3/uL (ref 3.8–10.8)

## 2022-11-14 LAB — BASIC METABOLIC PANEL
BUN: 15 mg/dL (ref 7–25)
CO2: 23 mmol/L (ref 20–32)
Calcium: 7.4 mg/dL — ABNORMAL LOW (ref 8.6–10.4)
Chloride: 108 mmol/L (ref 98–110)
Creat: 0.98 mg/dL (ref 0.60–1.00)
Glucose, Bld: 96 mg/dL (ref 65–99)
Potassium: 5.1 mmol/L (ref 3.5–5.3)
Sodium: 143 mmol/L (ref 135–146)

## 2022-11-14 NOTE — Telephone Encounter (Signed)
Phone call from [facility] Critical magnesium of 1.0  Reviewed yesterday's note Hadn't started the oral magnesium as of the lab draw--and this level is the same as in the ER 3 days earlier Asked the nurse to call to make sure she is taking the mag ox 400 bid x 3 days and is doing okay Apparently has in person heme/onc visit 10/1

## 2022-11-15 ENCOUNTER — Encounter: Payer: Self-pay | Admitting: Family Medicine

## 2022-11-15 MED ORDER — MAGNESIUM OXIDE -MG SUPPLEMENT 400 (240 MG) MG PO TABS: 400.0000 mg | ORAL_TABLET | Freq: Two times a day (BID) | ORAL | 1 refills | Status: DC

## 2022-11-15 NOTE — Assessment & Plan Note (Signed)
Magnesium level of 1  Seen in ER Reviewed hospital records, lab results and studies in detail  She unfortunately declined IV mag (was given 800 mg of mag ox) and left AMA Further - her pharmacy was out of mag ox so the 400 mg daily she was to start has not started yet Voiced my concern to pt and encouraged her to start the mag ox asap Discussed risks of low magnesium  Low mag may be cause of her muscle jerking symptoms (which have stopped since the dose in ER)  Will re check this today -hope for mild improvement  No doubt omeprazole is affecting absorbtion

## 2022-11-15 NOTE — Assessment & Plan Note (Signed)
This has improved with dose of mag in ER

## 2022-11-15 NOTE — Assessment & Plan Note (Signed)
Thinks she is doing well Some concern for poly pharmacy  She denies any more hallucinations

## 2022-11-15 NOTE — Assessment & Plan Note (Signed)
Last vitamin D Lab Results  Component Value Date   VD25OH 35.01 11/10/2022   Vitamin D level is therapeutic with current supplementation (low normal) Disc importance of this to bone and overall health

## 2022-11-15 NOTE — Addendum Note (Signed)
Addended by: Roxy Manns A on: 11/15/2022 11:20 AM   Modules accepted: Orders

## 2022-11-15 NOTE — Assessment & Plan Note (Signed)
Lab Results  Component Value Date   VITAMINB12 219 11/10/2022   Low normal  Continue to watch this Encouraged continued B12 oral over the counter  Follow up with heme/onc is planned tues

## 2022-11-15 NOTE — Telephone Encounter (Signed)
Thanks for taking that- she could not start the mag ox until Friday because her pharmacy could not get it  Unfortunately she declined IV mag in the ER for unknown reason

## 2022-11-15 NOTE — Assessment & Plan Note (Signed)
Last hb 7.8  Was seen in ER and neg hemoccult exam She left AMA Has follow up with her heme/onc on Tuesday and will discuss iron inf and /or transfusion depending on symptoms  Continues to take irno bid

## 2022-11-15 NOTE — Assessment & Plan Note (Signed)
BP: (!) 144/78  Up a bit in setting of stressors /abn labs  Losartan 25 mg  May increase this in future after addressing above  Will plan f/u

## 2022-11-15 NOTE — Assessment & Plan Note (Signed)
Pt notes this is not as bad as friend was concerned for at last visit  Has low Hb and low mag  Polypharmacy   Has follow up with psychiatry on Tuesday to discuss medication/polypharmacy

## 2022-11-15 NOTE — Assessment & Plan Note (Signed)
Takes requip 0.5 in am and 3 mg in pm  Anemia may play a role with Hb of 7.4 (taking iron , on ppi, has appointment with heme/onc on Tuesday)  Would love to decrease requip in past re: concerns about MS

## 2022-11-16 ENCOUNTER — Other Ambulatory Visit: Payer: Self-pay | Admitting: Internal Medicine

## 2022-11-16 MED ORDER — MAGNESIUM OXIDE -MG SUPPLEMENT 400 (240 MG) MG PO TABS: 400.0000 mg | ORAL_TABLET | Freq: Two times a day (BID) | ORAL | 1 refills | Status: AC

## 2022-11-16 NOTE — Telephone Encounter (Signed)
Aware See result note for labs Needs lab for magnesium level in 1-2 weeks please now that she is taking magnesium

## 2022-11-16 NOTE — Addendum Note (Signed)
Addended by: Roxy Manns A on: 11/16/2022 06:42 PM   Modules accepted: Orders

## 2022-11-16 NOTE — Telephone Encounter (Signed)
Patient called in stating that [facility] doesn't have magnesium in stock,its on back order.Is there another pharmacy that it can be called in to?

## 2022-11-16 NOTE — Telephone Encounter (Signed)
Please see notes under access nurse note from Dr Alphonsus Sias and  Dr Milinda Antis. Sending note to Dr Milinda Antis.

## 2022-11-16 NOTE — Telephone Encounter (Signed)
I sent it to [facility]  If they don't have it will need to call around

## 2022-11-17 ENCOUNTER — Other Ambulatory Visit: Payer: Self-pay

## 2022-11-17 ENCOUNTER — Encounter: Payer: Self-pay | Admitting: Nurse Practitioner

## 2022-11-17 ENCOUNTER — Ambulatory Visit (INDEPENDENT_AMBULATORY_CARE_PROVIDER_SITE_OTHER): Payer: HMO | Admitting: Psychiatry

## 2022-11-17 ENCOUNTER — Inpatient Hospital Stay (HOSPITAL_BASED_OUTPATIENT_CLINIC_OR_DEPARTMENT_OTHER): Payer: HMO | Admitting: Nurse Practitioner

## 2022-11-17 ENCOUNTER — Inpatient Hospital Stay: Payer: HMO | Attending: Internal Medicine

## 2022-11-17 ENCOUNTER — Encounter: Payer: Self-pay | Admitting: Psychiatry

## 2022-11-17 ENCOUNTER — Inpatient Hospital Stay: Payer: HMO

## 2022-11-17 VITALS — BP 140/58 | HR 97 | Temp 97.2°F | Wt 195.0 lb

## 2022-11-17 DIAGNOSIS — F411 Generalized anxiety disorder: Secondary | ICD-10-CM | POA: Diagnosis not present

## 2022-11-17 DIAGNOSIS — Z801 Family history of malignant neoplasm of trachea, bronchus and lung: Secondary | ICD-10-CM | POA: Insufficient documentation

## 2022-11-17 DIAGNOSIS — Z85818 Personal history of malignant neoplasm of other sites of lip, oral cavity, and pharynx: Secondary | ICD-10-CM | POA: Diagnosis not present

## 2022-11-17 DIAGNOSIS — Z9221 Personal history of antineoplastic chemotherapy: Secondary | ICD-10-CM | POA: Insufficient documentation

## 2022-11-17 DIAGNOSIS — D509 Iron deficiency anemia, unspecified: Secondary | ICD-10-CM | POA: Diagnosis not present

## 2022-11-17 DIAGNOSIS — E1143 Type 2 diabetes mellitus with diabetic autonomic (poly)neuropathy: Secondary | ICD-10-CM | POA: Insufficient documentation

## 2022-11-17 DIAGNOSIS — Z08 Encounter for follow-up examination after completed treatment for malignant neoplasm: Secondary | ICD-10-CM

## 2022-11-17 DIAGNOSIS — Z853 Personal history of malignant neoplasm of breast: Secondary | ICD-10-CM | POA: Insufficient documentation

## 2022-11-17 DIAGNOSIS — F418 Other specified anxiety disorders: Secondary | ICD-10-CM | POA: Insufficient documentation

## 2022-11-17 DIAGNOSIS — Z87891 Personal history of nicotine dependence: Secondary | ICD-10-CM | POA: Insufficient documentation

## 2022-11-17 DIAGNOSIS — R600 Localized edema: Secondary | ICD-10-CM | POA: Diagnosis not present

## 2022-11-17 DIAGNOSIS — M8580 Other specified disorders of bone density and structure, unspecified site: Secondary | ICD-10-CM | POA: Diagnosis not present

## 2022-11-17 DIAGNOSIS — Z171 Estrogen receptor negative status [ER-]: Secondary | ICD-10-CM

## 2022-11-17 DIAGNOSIS — G4700 Insomnia, unspecified: Secondary | ICD-10-CM | POA: Insufficient documentation

## 2022-11-17 DIAGNOSIS — D649 Anemia, unspecified: Secondary | ICD-10-CM | POA: Diagnosis not present

## 2022-11-17 DIAGNOSIS — M899 Disorder of bone, unspecified: Secondary | ICD-10-CM

## 2022-11-17 LAB — CMP (CANCER CENTER ONLY)
ALT: 9 U/L (ref 0–44)
AST: 17 U/L (ref 15–41)
Albumin: 3 g/dL — ABNORMAL LOW (ref 3.5–5.0)
Alkaline Phosphatase: 80 U/L (ref 38–126)
Anion gap: 10 (ref 5–15)
BUN: 18 mg/dL (ref 8–23)
CO2: 23 mmol/L (ref 22–32)
Calcium: 8.5 mg/dL — ABNORMAL LOW (ref 8.9–10.3)
Chloride: 107 mmol/L (ref 98–111)
Creatinine: 1.09 mg/dL — ABNORMAL HIGH (ref 0.44–1.00)
GFR, Estimated: 53 mL/min — ABNORMAL LOW (ref >60)
Glucose, Bld: 156 mg/dL — ABNORMAL HIGH (ref 70–99)
Potassium: 4.7 mmol/L (ref 3.5–5.1)
Sodium: 140 mmol/L (ref 135–145)
Total Bilirubin: 0.3 mg/dL (ref 0.3–1.2)
Total Protein: 6.9 g/dL (ref 6.5–8.1)

## 2022-11-17 LAB — CBC WITH DIFFERENTIAL (CANCER CENTER ONLY)
Abs Immature Granulocytes: 0.04 10*3/uL (ref 0.00–0.07)
Basophils Absolute: 0 10*3/uL (ref 0.0–0.1)
Basophils Relative: 1 %
Eosinophils Absolute: 0.2 10*3/uL (ref 0.0–0.5)
Eosinophils Relative: 3 %
HCT: 25.6 % — ABNORMAL LOW (ref 36.0–46.0)
Hemoglobin: 7.4 g/dL — ABNORMAL LOW (ref 12.0–15.0)
Immature Granulocytes: 1 %
Lymphocytes Relative: 12 %
Lymphs Abs: 1 10*3/uL (ref 0.7–4.0)
MCH: 22.8 pg — ABNORMAL LOW (ref 26.0–34.0)
MCHC: 28.9 g/dL — ABNORMAL LOW (ref 30.0–36.0)
MCV: 79 fL — ABNORMAL LOW (ref 80.0–100.0)
Monocytes Absolute: 0.3 10*3/uL (ref 0.1–1.0)
Monocytes Relative: 4 %
Neutro Abs: 6.5 10*3/uL (ref 1.7–7.7)
Neutrophils Relative %: 79 %
Platelet Count: 242 10*3/uL (ref 150–400)
RBC: 3.24 MIL/uL — ABNORMAL LOW (ref 3.87–5.11)
RDW: 19.7 % — ABNORMAL HIGH (ref 11.5–15.5)
WBC Count: 8.1 10*3/uL (ref 4.0–10.5)
nRBC: 0 % (ref 0.0–0.2)

## 2022-11-17 LAB — IRON AND TIBC
Iron: 18 ug/dL — ABNORMAL LOW (ref 28–170)
Saturation Ratios: 5 % — ABNORMAL LOW (ref 10.4–31.8)
TIBC: 389 ug/dL (ref 250–450)
UIBC: 371 ug/dL

## 2022-11-17 LAB — FERRITIN: Ferritin: 25 ng/mL (ref 11–307)

## 2022-11-17 LAB — MAGNESIUM: Magnesium: 1.3 mg/dL — ABNORMAL LOW (ref 1.7–2.4)

## 2022-11-17 LAB — LACTATE DEHYDROGENASE: LDH: 148 U/L (ref 98–192)

## 2022-11-17 MED ORDER — SODIUM CHLORIDE 0.9 % IV SOLN
Freq: Once | INTRAVENOUS | Status: AC
  Filled 2022-11-17: qty 250

## 2022-11-17 MED ORDER — ALPRAZOLAM 0.5 MG PO TABS
0.5000 mg | ORAL_TABLET | Freq: Every day | ORAL | 0 refills | Status: DC | PRN
Start: 2022-12-07 — End: 2023-01-01

## 2022-11-17 MED ORDER — SODIUM CHLORIDE 0.9 % IV SOLN
200.0000 mg | Freq: Once | INTRAVENOUS | Status: AC
  Administered 2022-11-17: 200 mg via INTRAVENOUS
  Filled 2022-11-17: qty 200

## 2022-11-17 MED ORDER — MAGNESIUM SULFATE 2 GM/50ML IV SOLN
2.0000 g | Freq: Once | INTRAVENOUS | Status: AC
  Administered 2022-11-17: 2 g via INTRAVENOUS
  Filled 2022-11-17: qty 50

## 2022-11-17 MED ORDER — BUSPIRONE HCL 15 MG PO TABS
ORAL_TABLET | ORAL | 1 refills | Status: DC
Start: 2022-11-17 — End: 2023-08-24

## 2022-11-17 NOTE — Progress Notes (Signed)
[facility] OFFICE PROGRESS NOTE  Patient Care Team: Tower, [name], MD as PCP - General Byrnett, Merrily Pew, MD (General Surgery) Johney Maine, MD (Oncology) Glendale Chard, DO as Consulting Physician (Neurology) Earna Coder, MD as Consulting Physician (Oncology) Carmina Miller, MD as Radiation Oncologist (Radiation Oncology) Kathyrn Sheriff, [facility] (Inactive) as Pharmacist (Pharmacist) Raj Janus, MD as Physician Assistant (Endocrinology)   Cancer Staging  No matching staging information was found for the patient.  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  5 CM TUMOR MASS BIOPSIES POSITIVE FOR INVASIVE MAMMARY CARCINOMA TRIPLE NEGATIVE DISEASE(jANUARY, 2017)stage IIIa. ER/PR/her2 Neu-NEGATIVE  # Cytoxan and Adriamycin from March 18, 2015; carbo-taxol x4 cycles  # AUg 18th s/p Lumpec & SLNBx- Complete path CR [ypT0 ypsn0] s/p RT [oct 2017; Dr.Crystal]  3.  MUGA scan of the heart shows ejection fraction to be 61% (January, 2017)   EGD/colonoscopy- Morene Antu 2022]-  ------------------------------------------------------------    DIAGNOSIS: BREAST CANCER  STAGE:  III       ;GOALS: curative  CURRENT/MOST RECENT THERAPY: surveillaince.     Carcinoma of upper-outer quadrant of left breast in female, estrogen receptor negative (HCC)  11/21/2015 Initial Diagnosis   Carcinoma of upper-outer quadrant of left breast in female, estrogen receptor negative (HCC)     INTERVAL HISTORY: Ambulating independently.  Alone.  Suzanne Strong 77 y.o.  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 She is followed by behavioral health and was seen earlier  today. She's unsure of which medications she takes. She denies lumps, bumps, or skin changes. Denies unintentional weight loss. She has ongoing nausea which she felt was related to medication she was taking though previously related to diabetes vs radiation. Endorses dark stools. Endorses insomnia which was previously controlled with medication.    Review of Systems  Constitutional:  Positive for malaise/fatigue. Negative for chills, diaphoresis, fever and weight loss.  HENT:  Negative for nosebleeds and sore throat.   Eyes:  Negative for double vision.  Respiratory:  Negative for hemoptysis, shortness of breath and wheezing.   Cardiovascular:  Negative for chest pain, palpitations, orthopnea and leg swelling.  Gastrointestinal:  Positive for melena. Negative for abdominal pain, blood in stool, constipation, diarrhea, heartburn, nausea and vomiting.  Genitourinary:  Negative for dysuria, frequency, hematuria and urgency.  Musculoskeletal:  Positive for falls. Negative for back pain and joint pain.  Skin: Negative.  Negative for itching and rash.  Neurological:  Positive for tingling. Negative for focal weakness, weakness and headaches.  Endo/Heme/Allergies:  Does not bruise/bleed easily.  Psychiatric/Behavioral:  Negative for depression. The patient has insomnia. The patient is not nervous/anxious.      PAST MEDICAL HISTORY :  Past Medical History:  Diagnosis Date   Anemia    iron deficiency and B12 deficiency   Anxiety    Breast cancer (HCC) 02/25/2015   upper-outer quadrant of left female breast, Triple Negative. Neo-adjuvant chemo with complete pathologic response.    Cervical dysplasia    conization   Diabetes mellitus without complication (HCC)    Diverticulosis    Dyspnea    Fever 04/11/2015   Gastroparesis    related to previous radiation tx   GERD (gastroesophageal reflux disease)  Hyperlipidemia    Hypertension    Neuromuscular disorder (HCC)    Neuropathy     legs/feet, S/P chemo meds   Personal history of chemotherapy    Personal history of radiation therapy 2017   LEFT lumpectomy w/ radiation   Pneumonia    RLS (restless legs syndrome)    Shingles    Hx of   Sleep apnea    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HCC) 2006   Wears dentures    partial bottom    PAST SURGICAL HISTORY :   Past Surgical History:  Procedure Laterality Date   APPENDECTOMY     BREAST BIOPSY Left 02/25/2015   INVASIVE MAMMARY CARCINOMA,Triple negative.   BREAST CYST ASPIRATION     BREAST EXCISIONAL BIOPSY Left 12/2015   surgery   BREAST LUMPECTOMY WITH NEEDLE LOCALIZATION Left 10/02/2015   Procedure: BREAST LUMPECTOMY WITH NEEDLE LOCALIZATION;  Surgeon: Earline Mayotte, MD;  Location: [facility];  Service: General;  Laterality: Left;   BREAST LUMPECTOMY WITH SENTINEL LYMPH NODE BIOPSY Left 10/02/2015   Procedure: LEFT BREAST WIDE EXCISION WITH SENTINEL LYMPH NODE BX;  Surgeon: Earline Mayotte, MD;  Location: [facility];  Service: General;  Laterality: Left;   BREAST SURGERY     breast biopsy benign   CARPAL TUNNEL RELEASE Right 05/19/2017   Procedure: CARPAL TUNNEL RELEASE;  Surgeon: Deeann Saint, MD;  Location: [facility];  Service: Orthopedics;  Laterality: Right;   CATARACT EXTRACTION W/PHACO Right 10/26/2018   Procedure: CATARACT EXTRACTION PHACO AND INTRAOCULAR LENS PLACEMENT (IOC) right diabetic;  Surgeon: Lockie Mola, MD;  Location: [facility];  Service: Ophthalmology;  Laterality: Right;  Diabetic - oral meds cancer center been notified and will come   CATARACT EXTRACTION W/PHACO Left 11/16/2018   Procedure: CATARACT EXTRACTION PHACO AND INTRAOCULAR LENS PLACEMENT (IOC) LEFT DIABETIC  01:01.0  17.0%  10.37;  Surgeon: Lockie Mola, MD;  Location: [facility];  Service: Ophthalmology;  Laterality: Left;  Diabetic - oral meds Port-a-cath   CHOLECYSTECTOMY     Cologuard  07/22/2016   Negative   COLONOSCOPY WITH PROPOFOL N/A 05/24/2020    Procedure: COLONOSCOPY WITH PROPOFOL;  Surgeon: Pasty Spillers, MD;  Location: [facility];  Service: Endoscopy;  Laterality: N/A;   ESOPHAGOGASTRODUODENOSCOPY  07/2009   normal with few gastric polyps   ESOPHAGOGASTRODUODENOSCOPY (EGD) WITH PROPOFOL N/A 05/24/2020   Procedure: ESOPHAGOGASTRODUODENOSCOPY (EGD) WITH PROPOFOL;  Surgeon: Pasty Spillers, MD;  Location: [facility];  Service: Endoscopy;  Laterality: N/A;   FLEXIBLE BRONCHOSCOPY Bilateral 06/24/2022   Procedure: FLEXIBLE BRONCHOSCOPY;  Surgeon: [name], MD;  Location: [facility];  Service: Pulmonary;  Laterality: Bilateral;   MOLE REMOVAL     PORT-A-CATH REMOVAL     PORTACATH PLACEMENT     PORTACATH PLACEMENT Right 03/12/2015   Procedure: INSERTION PORT-A-CATH;  Surgeon: Earline Mayotte, MD;  Location: [facility];  Service: General;  Laterality: Right;   RADICAL NECK DISSECTION     TONSILLECTOMY Left    cancer treated with chemo and radiation   TRIGGER FINGER RELEASE Right 05/19/2017   Procedure: RELEASE TRIGGER FINGER/A-1 PULLEY ring finger;  Surgeon: Deeann Saint, MD;  Location: [facility];  Service: Orthopedics;  Laterality: Right;   VIDEO BRONCHOSCOPY WITH ENDOBRONCHIAL ULTRASOUND Bilateral 06/24/2022   Procedure: VIDEO BRONCHOSCOPY WITH ENDOBRONCHIAL ULTRASOUND;  Surgeon: [name], MD;  Location: [facility];  Service: Pulmonary;  Laterality: Bilateral;    FAMILY HISTORY :   Family History  Problem Relation Age of Onset   Osteoporosis Mother  Hyperlipidemia Mother    Depression Mother    Cancer Mother        skin cancer ? basal cell and lung ca heavy smoker   Alcohol abuse Father    Cancer Father        skin CA ? basal cell   Heart disease Father        CHF   Hyperlipidemia Brother    Hypertension Brother    Breast cancer Neg Hx     SOCIAL HISTORY:   Social History   Tobacco Use   Smoking status: Former    Current packs/day: 0.00    Types: Cigarettes    Quit date: 1985    Years  since quitting: 39.7   Smokeless tobacco: Never  Vaping Use   Vaping status: Never Used  Substance Use Topics   Alcohol use: No    Alcohol/week: 0.0 standard drinks of alcohol   Drug use: No    ALLERGIES:  is allergic to amoxicillin, penicillin g, fluconazole, difuleron [ferrous fumarate-dss], and other.  MEDICATIONS:  Current Outpatient Medications  Medication Sig Dispense Refill   acetaminophen (TYLENOL) 325 MG tablet Take 2 tablets (650 mg total) by mouth every 6 (six) hours as needed for mild pain (or Fever >/= 101).     albuterol (PROVENTIL) (2.5 MG/3ML) 0.083% nebulizer solution Take 3 mLs (2.5 mg total) by nebulization in the morning and at bedtime. 180 mL 11   albuterol (VENTOLIN HFA) 108 (90 Base) MCG/ACT inhaler Inhale 2 puffs into the lungs every 6 (six) hours as needed for wheezing or shortness of breath. 8 g 3   [START ON 12/07/2022] ALPRAZolam (XANAX) 0.5 MG tablet Take 1 tablet (0.5 mg total) by mouth daily as needed. for anxiety 30 tablet 0   amitriptyline (ELAVIL) 50 MG tablet Take 1 tablet (50 mg total) by mouth at bedtime. 90 tablet 3   amLODipine (NORVASC) 5 MG tablet Take 5 mg by mouth daily.     atorvastatin (LIPITOR) 10 MG tablet TAKE ONE TABLET BY MOUTH ONCE DAILY 90 tablet 3   BIOTIN PO Take by mouth.     busPIRone (BUSPAR) 15 MG tablet Take 1.5 tabs po BID 270 tablet 1   Cholecalciferol (VITAMIN D-1000 MAX ST) 25 MCG (1000 UT) tablet Take 2,000 Units by mouth daily. Takes three 50 mcg tablets, 3 capsules daily     Emollient (COLLAGEN EX)      famotidine (PEPCID) 20 MG tablet Take 1 tablet by mouth twice daily 180 tablet 0   ferrous sulfate 325 (65 FE) MG tablet Take 1 tablet (325 mg total) by mouth 2 (two) times daily with a meal.     glipiZIDE (GLUCOTROL) 5 MG tablet Take 2 tablets (10 mg total) by mouth daily before breakfast. (Patient taking differently: Take 10 mg by mouth 2 (two) times daily before a meal.)     glucose blood (ONETOUCH ULTRA) test strip USE  TO check blood glucose ONCE DAILY AND AS NEEDED FOR DIABETES 100 strip 1   losartan (COZAAR) 25 MG tablet Take 1 tablet (25 mg total) by mouth daily. 30 tablet 1   magnesium oxide (MAG-OX) 400 (240 Mg) MG tablet Take 1 tablet (400 mg total) by mouth 2 (two) times daily. 60 tablet 1   metFORMIN (GLUCOPHAGE) 500 MG tablet Take 2,000 mg by mouth daily with breakfast.     Multiple Vitamins-Minerals (WOMENS MULTIVITAMIN PO) Take by mouth.     omeprazole (PRILOSEC) 20 MG capsule TAKE ONE CAPSULE BY  MOUTH EVERYDAY AT BEDTIME 90 capsule 3   pregabalin (LYRICA) 150 MG capsule Take 300 mg by mouth at bedtime.     rOPINIRole (REQUIP) 1 MG tablet TAKE 1/2 TABLET BY MOUTH EVERY MORNING and TAKE THREE TABLETS BY MOUTH EVERYDAY AT BEDTIME 315 tablet 3   Sodium Chloride (HYPERSAL) 3.5 % NEBU Take 1 each by nebulization 2 (two) times daily as needed. 240 mL 5   zinc gluconate 50 MG tablet Take 50 mg by mouth daily.     b complex vitamins tablet Take 1 tablet by mouth daily. (Patient not taking: Reported on 11/17/2022)     meloxicam (MOBIC) 15 MG tablet Take 1 tablet by mouth daily as needed for pain. (Patient not taking: Reported on 11/17/2022)     sertraline (ZOLOFT) 100 MG tablet Take 2 tablets (200 mg total) by mouth every morning. (Patient not taking: Reported on 11/17/2022) 180 tablet 1   No current facility-administered medications for this visit.   Facility-Administered Medications Ordered in Other Visits  Medication Dose Route Frequency Provider Last Rate Last Admin   influenza vaccine adjuvanted (FLUAD) injection 0.5 mL  0.5 mL Intramuscular Once Louretta Shorten R, MD       sodium chloride flush (NS) 0.9 % injection 10 mL  10 mL Intravenous PRN Earna Coder, MD   10 mL at 09/11/15 0845    PHYSICAL EXAMINATION: ECOG PERFORMANCE STATUS: 1 - Symptomatic but completely ambulatory  BP (!) 140/58 (BP Location: Left Arm, Patient Position: Sitting)   Pulse 97   Temp (!) 97.2 F (36.2 C)  (Tympanic)   Wt 195 lb (88.5 kg)   SpO2 95%   BMI 31.00 kg/m   Filed Weights   11/17/22 1325  Weight: 195 lb (88.5 kg)   Physical Exam Constitutional:      Appearance: She is not ill-appearing.     Comments: Obese.  She is walking herself.  Alone.  HENT:     Head: Normocephalic and atraumatic.  Cardiovascular:     Rate and Rhythm: Normal rate and regular rhythm.  Pulmonary:     Effort: No respiratory distress.     Breath sounds: No wheezing.  Abdominal:     General: There is no distension.     Palpations: Abdomen is soft.     Tenderness: There is no abdominal tenderness. There is no guarding.  Musculoskeletal:        General: No tenderness. Normal range of motion.     Cervical back: Normal range of motion and neck supple.     Right lower leg: Edema present.     Left lower leg: Edema present.  Skin:    General: Skin is warm.     Coloration: Skin is not pale.  Neurological:     Mental Status: She is alert and oriented to person, place, and time.  Psychiatric:        Mood and Affect: Affect normal.     Comments: Poor historian     LABORATORY DATA:  I have reviewed the data as listed Lab Results  Component Value Date   WBC 8.1 11/17/2022   NEUTROABS 6.5 11/17/2022   HGB 7.4 (L) 11/17/2022   HCT 25.6 (L) 11/17/2022   MCV 79.0 (L) 11/17/2022   PLT 242 11/17/2022      Chemistry      Component Value Date/Time   NA 140 11/17/2022 1312   NA 142 03/01/2014 0846   K 4.7 11/17/2022 1312   K 4.3 03/01/2014 0846  CL 107 11/17/2022 1312   CL 102 03/01/2014 0846   CO2 23 11/17/2022 1312   CO2 28 03/01/2014 0846   BUN 18 11/17/2022 1312   BUN 12 03/01/2014 0846   CREATININE 1.09 (H) 11/17/2022 1312   CREATININE 0.98 11/13/2022 1608      Component Value Date/Time   CALCIUM 8.5 (L) 11/17/2022 1312   CALCIUM 8.6 03/01/2014 0846   ALKPHOS 80 11/17/2022 1312   ALKPHOS 122 (H) 03/01/2014 0846   AST 17 11/17/2022 1312   ALT 9 11/17/2022 1312   ALT 28 03/01/2014  0846   BILITOT 0.3 11/17/2022 1312     Iron/TIBC/Ferritin/ %Sat    Component Value Date/Time   IRON 18 (L) 11/17/2022 1312   TIBC 389 11/17/2022 1312   FERRITIN 25 11/17/2022 1312   IRONPCTSAT 5 (L) 11/17/2022 1312   Lab Results  Component Value Date   MG 1.3 (L) 11/17/2022   MG 1.0 (LL) 11/13/2022   MG 1.0 (LL) 11/10/2022    RADIOGRAPHIC STUDIES: I have personally reviewed the radiological images as listed and agreed with the findings in the report. No results found.   ASSESSMENT & PLAN:   # Anemia- baseline hemoglobin 10-11 for past 10 years. Acutely worse. 7.4 today. Ferritin 25, iron sat 5%. Consistent with iron deficiency. Endorses black stools. Recommend workup with GI. She's in agreement. Symptomatic with weakness and restless leg. Hmg 7.4 today which is stable over past couple of weeks but down from previous. No other cytopenias. Recommend IV iron/venofer for replenishment of iron stores. Reviewed small risk of anaphylaxis. She is in agreement to proceed. She is not currently taking oral iron but unsure why she stopped.   # Hypomagnesemia- Sep 2024- magnesium 1. Not currently taking magnesium. Declined IV replacement in ER. Today, 1.3. Improved. Plan for IV Mag Sulfate 2 mg in clinic today.   # Left Breast Cancer - diagnosed 2017. Stage III Triple negative. S/p neoadjuvant chemotherapy. On Surveillance. BIL March 2023. She has not had routine mammogram with [facility] so we will order today.   # Anxiety/insomnia/depression- managed by behavioral health. Patient's provider had reached out regarding possible refill of amitriptyline. We have not been filling this medication for patient though she is a poor historian of her medications. [facility] called [facility] pharmacy for med reconcillation and provided that information to Mrs. Montez Morita.   # Osteopenia- AUG 2021- T score -1.8. Reclast last received 10/2019. I've encouraged her to start oral calcium 1200 mg and vitamin d 1000 mg daily. Recommend  repeating bone density which we can consider in the future. She's not on AI but did receive chemotherapy and we have been managing this for her in the past.   # Bilateral lower extremity edema- anemia may be contributing but at her request I'll refer to vascular for evaluation.   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 Unchanged.     DISPOSITION: Venofer today.  Venofer 1-2 times a week for total of 5.  Ref for mammogram Ref to GI for IDA Ref to Vascular for Bilateral edema Plan to see her back at her scheduled appt on 10/11 to recheck labs, see me, +/- venofer +/- reclast- la  No problem-specific Assessment & Plan notes found for this encounter.  No orders of the defined types were placed in this encounter.  All questions were answered. The patient knows to call the clinic with any problems, questions or concerns.     Alinda Dooms, NP 11/17/2022

## 2022-11-17 NOTE — Progress Notes (Signed)
Suzanne Strong [id number] 23-Oct-1945 77 y.o.  Subjective:   Patient ID:  Suzanne Strong is a 77 y.o. (DOB [birth year]) female.  Chief Complaint:  Chief Complaint  Patient presents with   Sleeping Problem   Anxiety    Anxiety     Suzanne Strong presents to the office today for follow-up of anxiety and insomnia. She reports that she had multiple falls in a 2-week period. She reports that she "would start jerking and it didn't matter what I was holding onto, I was going down." She reports that she checked her glucose levels after these falls and glucose readings have been lower (55-60).  She told her friend about a dream that she had and reports that friend interpreted this as hallucinations. She reports, "things have gotten better, I think." She reports that in the past she was taking a whole pill instead of a 1/2 as directed. She reports that she occasionally take 3 Buspar in the morning instead of 1.5 tabs BID- "I won't do that again."  She reports that she has been without Sertraline for over a month or so.   She reports, "by the afternoon I am jittery and not nearly as calm." She reports that she was awake all night last night. She reports that she has had more sleepless nights since she has not had Sertraline. She reports rumination at times. She reports that she will take Alprazolam prn when she is awake late at night and unable to get something off her mind and got to sleep. She reports taking Alprazolam prn about once a week. She reports adequate concentration. She reports weight loss when taking Rybelsus due to severe nausea. She reports that appetite is ok now. Denies SI.   She denies depressed mood. Energy has been very low and has been feeling lethargic. "The motivation is there, I just get so tired."   She reports that she has had critically low magnesium, a B12 deficiency, and anemia and is going today for infusions.   She reports that she was laid off at work from her  part-time position on July 8. She reports that she fell at work. She reports that this has been a significant change and the first time she has not worked in 58 years. She reports, "I don't know what to do with myself." She has been reading, cooking, and baking."   She reports that her previous oncologist has retired and was prescribing Amitriptyline.   Past Psychiatric Medication Trials: Remeron Xanax Gabapentin- Unsure if this is helpful for RLS Lyrica Ropinorole- Helpful for RLS Zoloft Buspar Lyrica Trazodone-Ineffective. Caused unsteadiness.  Amitriptyline-Helps with sleep initiation. Does not notice benefit for pain.  Belsomra-Effective.  Temazepam-caused vivid dreams. Helped with sleep initiation and did not help with sleep maintenance.    GAD-7    Flowsheet Row Office Visit from 11/10/2022 in [facility] Visit from 08/18/2022 in [facility] at [facility] Visit from 05/21/2022 in [facility] at [facility] Visit from 04/06/2022 in [name]  Total GAD-7 Score 3 3 0 4      Mini-Mental    Flowsheet Row Clinical Support from 07/15/2018 in [facility] Clinical Support from 06/29/2017 in [facility] Clinical Support from 06/25/2016 in [facility] at [facility]  Total Score (max 30 points ) 17 20 20  PHQ2-9    Flowsheet Row Office Visit from 11/10/2022 in [facility] Visit from 08/18/2022 in [facility] at [facility] Office Visit from 05/21/2022 in [facility] at [facility] Office Visit from 04/06/2022 in [facility] at [facility] Office Visit from 01/29/2022 in [facility] at [facility]  PHQ-2 Total Score 0 0 0 0 0  PHQ-9 Total Score 3 1 -- 6 --       Flowsheet Row ED from 11/10/2022 in [facility] Emergency Department at [facility] ED from 07/10/2022 in [facility] Emergency Department at [facility] Admission (Discharged) from 06/24/2022 in [facility]  C-SSRS RISK CATEGORY No Risk No Risk No Risk        Review of Systems:  Review of Systems  Musculoskeletal:  Negative for gait problem.  Neurological:        She reports episodes of involuntary "jerking" in her arms and legs for about 5 days over several years and this occurred recently and lasted over a longer period of time. She reports glucose levels were 55-60 when this has occurred.  She reports having increased difficulty with restless legs  Psychiatric/Behavioral:         Please refer to HPI    Medications: I have reviewed the patient's current medications.  Current Outpatient Medications  Medication Sig Dispense Refill   albuterol (VENTOLIN HFA) 108 (90 Base) MCG/ACT inhaler Inhale 2 puffs into the lungs every 6 (six) hours as needed for wheezing or shortness of breath. 8 g 3   amitriptyline (ELAVIL) 50 MG tablet Take 1 tablet (50 mg total) by mouth at bedtime. 90 tablet 3   amLODipine (NORVASC) 5 MG tablet Take 5 mg by mouth daily.     atorvastatin (LIPITOR) 10 MG tablet TAKE ONE TABLET BY MOUTH ONCE DAILY 90 tablet 3   BIOTIN PO Take by mouth.     Cholecalciferol (VITAMIN D-1000 MAX ST) 25 MCG (1000 UT) tablet Take 2,000 Units by mouth daily. Takes three 50 mcg tablets, 3 capsules daily     famotidine (PEPCID) 20 MG tablet Take 1 tablet by mouth twice daily 180 tablet 0   ferrous sulfate 325 (65 FE) MG tablet Take 1 tablet (325 mg total) by mouth 2 (two) times daily with a meal.     glipiZIDE (GLUCOTROL) 5 MG tablet Take 2 tablets (10 mg total) by mouth daily before breakfast. (Patient taking differently: Take 10 mg by mouth 2 (two) times daily before a meal.)     losartan (COZAAR) 25 MG tablet Take 1 tablet (25 mg  total) by mouth daily. 30 tablet 1   magnesium oxide (MAG-OX) 400 (240 Mg) MG tablet Take 1 tablet (400 mg total) by mouth 2 (two) times daily. 60 tablet 1   metFORMIN (GLUCOPHAGE) 500 MG tablet Take 2,000 mg by mouth daily with breakfast.     Multiple Vitamins-Minerals (WOMENS MULTIVITAMIN PO) Take by mouth.     omeprazole (PRILOSEC) 20 MG capsule TAKE ONE CAPSULE BY MOUTH EVERYDAY AT BEDTIME 90 capsule 3   pregabalin (LYRICA) 150 MG capsule Take 300 mg by mouth at bedtime.     rOPINIRole (REQUIP) 1 MG tablet TAKE 1/2 TABLET BY MOUTH EVERY MORNING and TAKE THREE TABLETS BY MOUTH EVERYDAY AT BEDTIME 315 tablet 3   zinc gluconate 50 MG tablet Take 50 mg by mouth daily.     acetaminophen (TYLENOL) 325  MG tablet Take 2 tablets (650 mg total) by mouth every 6 (six) hours as needed for mild pain (or Fever >/= 101).     albuterol (PROVENTIL) (2.5 MG/3ML) 0.083% nebulizer solution Take 3 mLs (2.5 mg total) by nebulization in the morning and at bedtime. 180 mL 11   [START ON 12/07/2022] ALPRAZolam (XANAX) 0.5 MG tablet Take 1 tablet (0.5 mg total) by mouth daily as needed. for anxiety 30 tablet 0   b complex vitamins tablet Take 1 tablet by mouth daily. (Patient not taking: Reported on 11/17/2022)     busPIRone (BUSPAR) 15 MG tablet Take 1.5 tabs po BID 270 tablet 1   Emollient (COLLAGEN EX)      glucose blood (ONETOUCH ULTRA) test strip USE TO check blood glucose ONCE DAILY AND AS NEEDED FOR DIABETES 100 strip 1   meloxicam (MOBIC) 15 MG tablet Take 1 tablet by mouth daily as needed for pain. (Patient not taking: Reported on 11/17/2022)     sertraline (ZOLOFT) 100 MG tablet Take 2 tablets (200 mg total) by mouth every morning. (Patient not taking: Reported on 11/17/2022) 180 tablet 1   Sodium Chloride (HYPERSAL) 3.5 % NEBU Take 1 each by nebulization 2 (two) times daily as needed. 240 mL 5   No current facility-administered medications for this visit.   Facility-Administered Medications Ordered in Other  Visits  Medication Dose Route Frequency Provider Last Rate Last Admin   influenza vaccine adjuvanted (FLUAD) injection 0.5 mL  0.5 mL Intramuscular Once Louretta Shorten R, MD       sodium chloride flush (NS) 0.9 % injection 10 mL  10 mL Intravenous PRN Earna Coder, MD   10 mL at 09/11/15 0845    Medication Side Effects: Other: Vivid dreams with Temazepam.  Allergies:  Allergies  Allergen Reactions   Amoxicillin Swelling    REACTION: rash, swelling Has patient had a PCN reaction causing immediate rash, facial/tongue/throat swelling, SOB or lightheadedness with hypotension: yes Has patient had a PCN reaction causing severe rash involving mucus membranes or skin necrosis: no Has patient had a PCN reaction that required hospitalization: no Has patient had a PCN reaction occurring within the last 10 years: yes If all of the above answers are "NO", then may proceed with Cephalosporin use.  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 Neo-adjuvant chemo with complete pathologic response.    Cervical dysplasia    conization   Diabetes mellitus without complication (HCC)    Diverticulosis    Dyspnea    Fever 04/11/2015   Gastroparesis    related to previous radiation tx   GERD (gastroesophageal reflux disease)    Hyperlipidemia    Hypertension    Neuromuscular disorder (HCC)    Neuropathy    legs/feet, S/P chemo meds   Personal history of chemotherapy    Personal history of radiation therapy 2017   LEFT lumpectomy w/ radiation    Pneumonia    RLS (restless legs syndrome)  Shingles    Hx of   Sleep apnea    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HCC) 2006   Wears dentures    partial bottom    Past Medical History, Surgical history, Social history, and Family history were reviewed and updated as appropriate.   Please see review of systems for further details on the patient's review from today.   Objective:   Physical Exam:  There were no vitals taken for this visit.  Physical Exam Constitutional:      General: She is not in acute distress. Musculoskeletal:        General: No deformity.  Neurological:     Mental Status: She is alert and oriented to person, place, and time.     Coordination: Coordination normal.  Psychiatric:        Attention and Perception: Attention and perception normal. She does not perceive auditory or visual hallucinations.        Mood and Affect: Mood is anxious. Mood is not depressed. Affect is not labile, blunt, angry or inappropriate.        Speech: Speech normal.        Behavior: Behavior normal.        Thought Content: Thought content normal. Thought content is not paranoid or delusional. Thought content does not include homicidal or suicidal ideation. Thought content does not include homicidal or suicidal plan.        Cognition and Memory: Cognition and memory normal.        Judgment: Judgment normal.     Comments: Insight intact     Lab Review:     Component Value Date/Time   NA 140 11/17/2022 1312   NA 142 03/01/2014 0846   K 4.7 11/17/2022 1312   K 4.3 03/01/2014 0846   CL 107 11/17/2022 1312   CL 102 03/01/2014 0846   CO2 23 11/17/2022 1312   CO2 28 03/01/2014 0846   GLUCOSE 156 (H) 11/17/2022 1312   GLUCOSE 219 (H) 03/01/2014 0846   BUN 18 11/17/2022 1312   BUN 12 03/01/2014 0846   CREATININE 1.09 (H) 11/17/2022 1312   CREATININE 0.98 11/13/2022 1608   CALCIUM 8.5 (L) 11/17/2022 1312   CALCIUM 8.6 03/01/2014 0846   PROT 6.9 11/17/2022 1312   PROT 7.5 03/01/2014 0846    ALBUMIN 3.0 (L) 11/17/2022 1312   ALBUMIN 3.4 03/01/2014 0846   AST 17 11/17/2022 1312   ALT 9 11/17/2022 1312   ALT 28 03/01/2014 0846   ALKPHOS 80 11/17/2022 1312   ALKPHOS 122 (H) 03/01/2014 0846   BILITOT 0.3 11/17/2022 1312   GFRNONAA 53 (L) 11/17/2022 1312   GFRNONAA >60 03/01/2014 0846   GFRNONAA >60 08/24/2013 1015   GFRAA >60 10/20/2019 0950   GFRAA >60 03/01/2014 0846   GFRAA >60 08/24/2013 1015       Component Value Date/Time   WBC 8.1 11/17/2022 1312   WBC 6.1 11/13/2022 1608   RBC 3.24 (L) 11/17/2022 1312   HGB 7.4 (L) 11/17/2022 1312   HGB 11.5 (L) 03/01/2014 0846   HCT 25.6 (L) 11/17/2022 1312   HCT 36.4 03/01/2014 0846   PLT 242 11/17/2022 1312   PLT 254 03/01/2014 0846   MCV 79.0 (L) 11/17/2022 1312   MCV 81 03/01/2014 0846   MCH 22.8 (L) 11/17/2022 1312   MCHC 28.9 (L) 11/17/2022 1312   RDW 19.7 (H) 11/17/2022 1312   RDW 15.6 (H) 03/01/2014 0846   LYMPHSABS 1.0 11/17/2022 1312   LYMPHSABS 1.0 03/01/2014 0846  MONOABS 0.3 11/17/2022 1312   MONOABS 0.3 03/01/2014 0846   EOSABS 0.2 11/17/2022 1312   EOSABS 0.1 03/01/2014 0846   BASOSABS 0.0 11/17/2022 1312   BASOSABS 0.0 03/01/2014 0846    No results found for: "POCLITH", "LITHIUM"   No results found for: "PHENYTOIN", "PHENOBARB", "VALPROATE", "CBMZ"   .res Assessment: Plan:    50 minutes spent dedicated to the care of this patient on the date of this encounter to include pre-visit review of records, ordering of medication, post visit documentation, and face-to-face time with the patient discussing polypharmacy and which medications seem to be the most effective for her. Suzanne Strong reports that Sertraline and Buspar have been helpful for her anxiety and would like to continue these medications. She reports that she has not taken Sertraline for several weeks after pharmacist refused to fill it due to potential medication interaction and has had worsening anxiety and insomnia since not being able to  take Sertraline. Suzanne Strong reports that she is unsure of benefit of Amitriptyline. Discussed considering dose reduction and possible discontinuation of Amitriptyline since she is unsure of benefit, and consider re-starting Sertraline. Discussed that provider would attempt to coordinate care with other providers since she has taken Amitriptyline long-term and it has been prescribed by both oncology and endocrinology. This provider contacted pt's oncology provider, who patient is seeing today as well. Oncology provider reports that they have not prescribed Amitriptyline recently and  would prefer Sertraline over Amitriptyline for this patient. Will contact pt's endocrinologist to further coordinate care since endocrinologist is currently managing Amitriptyline and Lyrica. Ropinirole is managed by PCP.  Pt to follow-up with this provider in 4 weeks or sooner if clinically indicated.  Patient advised to contact office with any questions, adverse effects, or acute worsening in signs and symptoms.   Suzanne Strong was seen today for sleeping problem and anxiety.  Diagnoses and all orders for this visit:  Anxiety state -     ALPRAZolam (XANAX) 0.5 MG tablet; Take 1 tablet (0.5 mg total) by mouth daily as needed. for anxiety -     busPIRone (BUSPAR) 15 MG tablet; Take 1.5 tabs po BID  Insomnia, unspecified type     Please see After Visit Summary for patient specific instructions.  Future Appointments  Date Time Provider Department Center  11/20/2022  3:15 PM [facility] INFUSION CHAIR 17 [facility] None  11/23/2022 11:00 AM [facility] LAB [facility]  11/24/2022  1:15 PM [facility] INFUSION CHAIR 15 [facility] None  11/25/2022  2:40 PM [facility] MM [facility]  11/27/2022  1:30 PM [facility] LAB [facility] None  11/27/2022  1:45 PM Alinda Dooms, NP [facility] None  11/27/2022  2:15 PM [facility] INFUSION CHAIR 19 [facility] None  12/01/2022  3:30 PM [facility] ANNUAL WELLNESS VISIT 1 [facility]  12/22/2022 10:00 AM  Corie Chiquito, PMHNP [facility] None    No orders of the defined types were placed in this encounter.   -------------------------------

## 2022-11-17 NOTE — Telephone Encounter (Signed)
Left VM letting pt know Dr. Royden Purl comments and phone # for pharmacy provided in VM

## 2022-11-18 ENCOUNTER — Telehealth: Payer: Self-pay | Admitting: *Deleted

## 2022-11-18 ENCOUNTER — Inpatient Hospital Stay: Payer: HMO

## 2022-11-18 NOTE — Telephone Encounter (Signed)
I called [facility] for med reconcillation.  Patient's Sertaline 100 mg was last filled in Feb 2024- #180  Amitriptyline 50 mg was last filled 06/08/22  #90 by Drue Novel  Buspar last filled 10/1  Lyrica 150 mg every day last filled 06/20/22 #90.

## 2022-11-20 ENCOUNTER — Inpatient Hospital Stay: Payer: HMO

## 2022-11-20 VITALS — BP 143/54 | HR 91 | Temp 98.0°F

## 2022-11-20 DIAGNOSIS — Z171 Estrogen receptor negative status [ER-]: Secondary | ICD-10-CM

## 2022-11-20 DIAGNOSIS — M899 Disorder of bone, unspecified: Secondary | ICD-10-CM

## 2022-11-20 DIAGNOSIS — D649 Anemia, unspecified: Secondary | ICD-10-CM | POA: Diagnosis not present

## 2022-11-20 MED ORDER — SODIUM CHLORIDE 0.9 % IV SOLN
Freq: Once | INTRAVENOUS | Status: AC
  Filled 2022-11-20: qty 250

## 2022-11-20 MED ORDER — SODIUM CHLORIDE 0.9 % IV SOLN
Freq: Once | INTRAVENOUS | Status: DC
  Filled 2022-11-20: qty 250

## 2022-11-20 MED ORDER — ZOLEDRONIC ACID 5 MG/100ML IV SOLN: 5.0000 mg | Freq: Once | INTRAVENOUS | Status: DC

## 2022-11-20 MED ORDER — SODIUM CHLORIDE 0.9 % IV SOLN
200.0000 mg | Freq: Once | INTRAVENOUS | Status: AC
  Administered 2022-11-20: 200 mg via INTRAVENOUS
  Filled 2022-11-20: qty 200

## 2022-11-20 MED ORDER — SERTRALINE HCL 50 MG PO TABS
ORAL_TABLET | ORAL | 1 refills | Status: DC
Start: 2022-11-20 — End: 2023-04-02

## 2022-11-20 NOTE — Telephone Encounter (Signed)
Patient is aware she needs to stop the amitriptyline.  She was advised about sertraline Rx and that instructions were on the bottle. Other recommendations reviewed, as well as the recent med changes.

## 2022-11-20 NOTE — Telephone Encounter (Signed)
Consulted with pt's oncology team and endocrinologist. Endocrinologist's office has advised pt to stop Amitriptyline.   Please contact pt to confirm that she is aware that she is to stop Amitriptyline. Will re-start Sertraline since pt reports that this was helpful for her anxiety and insomnia in the past. Please advise her that Sertraline will need to be started at a lower dose and gradually increased since she has not taken it recently. Script will have instructions on how to increase dose. Recommend considering if friends may be able to help her set up medication boxes since she has had several recent medication changes and fill dates provided to oncology team from pharmacy indicate that she may not be taking medications consistently:  Sertraline 100 mg was last filled in Feb 2024- #180   Amitriptyline 50 mg was last filled 06/08/22  #90 by Drue Novel   Buspar last filled 10/1   Lyrica 150 mg every day last filled 06/20/22 #90.

## 2022-11-20 NOTE — Telephone Encounter (Signed)
LVM to RC 

## 2022-11-22 ENCOUNTER — Telehealth: Payer: Self-pay | Admitting: Family Medicine

## 2022-11-22 NOTE — Telephone Encounter (Signed)
-----   Message from Alvina Chou sent at 11/16/2022  3:07 PM EDT ----- Regarding: Lab orders for Mon, 10.7.24 Lab orders, thanks

## 2022-11-23 ENCOUNTER — Other Ambulatory Visit (INDEPENDENT_AMBULATORY_CARE_PROVIDER_SITE_OTHER): Payer: HMO

## 2022-11-23 LAB — MAGNESIUM: Magnesium: 1.3 mg/dL — ABNORMAL LOW (ref 1.5–2.5)

## 2022-11-24 ENCOUNTER — Inpatient Hospital Stay: Payer: HMO

## 2022-11-24 VITALS — BP 128/44 | HR 88 | Temp 97.9°F

## 2022-11-24 DIAGNOSIS — C50412 Malignant neoplasm of upper-outer quadrant of left female breast: Secondary | ICD-10-CM

## 2022-11-24 DIAGNOSIS — M899 Disorder of bone, unspecified: Secondary | ICD-10-CM

## 2022-11-24 DIAGNOSIS — D649 Anemia, unspecified: Secondary | ICD-10-CM | POA: Diagnosis not present

## 2022-11-24 MED ORDER — SODIUM CHLORIDE 0.9 % IV SOLN
Freq: Once | INTRAVENOUS | Status: AC
  Filled 2022-11-24: qty 250

## 2022-11-24 MED ORDER — SODIUM CHLORIDE 0.9 % IV SOLN
200.0000 mg | Freq: Once | INTRAVENOUS | Status: AC
  Administered 2022-11-24: 200 mg via INTRAVENOUS
  Filled 2022-11-24: qty 200

## 2022-11-24 NOTE — Patient Instructions (Signed)
Iron Sucrose Injection What is this medication? IRON SUCROSE (EYE ern SOO krose) treats low levels of iron (iron deficiency anemia) in people with kidney disease. Iron is a mineral that plays an important role in making red blood cells, which carry oxygen from your lungs to the rest of your body. This medicine may be used for other purposes; ask your health care provider or pharmacist if you have questions. COMMON BRAND NAME(S): Venofer What should I tell my care team before I take this medication? They need to know if you have any of these conditions: Anemia not caused by low iron levels Heart disease High levels of iron in the blood Kidney disease Liver disease An unusual or allergic reaction to iron, other medications, foods, dyes, or preservatives Pregnant or trying to get pregnant Breastfeeding How should I use this medication? This medication is for infusion into a vein. It is given in a hospital or clinic setting. Talk to your care team about the use of this medication in children. While this medication may be prescribed for children as young as 2 years for selected conditions, precautions do apply. Overdosage: If you think you have taken too much of this medicine contact a poison control center or emergency room at once. NOTE: This medicine is only for you. Do not share this medicine with others. What if I miss a dose? Keep appointments for follow-up doses. It is important not to miss your dose. Call your care team if you are unable to keep an appointment. What may interact with this medication? Do not take this medication with any of the following: Deferoxamine Dimercaprol Other iron products This medication may also interact with the following: Chloramphenicol Deferasirox This list may not describe all possible interactions. Give your health care provider a list of all the medicines, herbs, non-prescription drugs, or dietary supplements you use. Also tell them if you smoke,  drink alcohol, or use illegal drugs. Some items may interact with your medicine. What should I watch for while using this medication? Visit your care team regularly. Tell your care team if your symptoms do not start to get better or if they get worse. You may need blood work done while you are taking this medication. You may need to follow a special diet. Talk to your care team. Foods that contain iron include: whole grains/cereals, dried fruits, beans, or peas, leafy green vegetables, and organ meats (liver, kidney). What side effects may I notice from receiving this medication? Side effects that you should report to your care team as soon as possible: Allergic reactions--skin rash, itching, hives, swelling of the face, lips, tongue, or throat Low blood pressure--dizziness, feeling faint or lightheaded, blurry vision Shortness of breath Side effects that usually do not require medical attention (report to your care team if they continue or are bothersome): Flushing Headache Joint pain Muscle pain Nausea Pain, redness, or irritation at injection site This list may not describe all possible side effects. Call your doctor for medical advice about side effects. You may report side effects to FDA at 1-800-FDA-1088. Where should I keep my medication? This medication is given in a hospital or clinic. It will not be stored at home. NOTE: This sheet is a summary. It may not cover all possible information. If you have questions about this medicine, talk to your doctor, pharmacist, or health care provider.  2024 Elsevier/Gold Standard (2022-07-10 00:00:00)

## 2022-11-24 NOTE — Progress Notes (Signed)
Declined 30 minute post observation. Aware of risks. Vitals stable at discharge.  

## 2022-11-26 ENCOUNTER — Telehealth: Payer: Self-pay | Admitting: Family Medicine

## 2022-11-26 NOTE — Telephone Encounter (Signed)
Patient returned call regarding labs, requested a call back when able.

## 2022-11-27 ENCOUNTER — Inpatient Hospital Stay: Payer: HMO

## 2022-11-27 ENCOUNTER — Ambulatory Visit: Payer: PPO

## 2022-11-27 ENCOUNTER — Telehealth: Payer: Self-pay | Admitting: *Deleted

## 2022-11-27 ENCOUNTER — Inpatient Hospital Stay: Payer: HMO | Admitting: Nurse Practitioner

## 2022-11-27 NOTE — Telephone Encounter (Signed)
T called reporting that she awoke with a fever this morning and needs to cancel her appointment at 130 today. She states yesterday she developed nausea and diarrhea also. Please advise

## 2022-11-27 NOTE — Telephone Encounter (Signed)
Msg sent to scheduling to r/s pt's apts for today when pt is feeling better.

## 2022-11-27 NOTE — Telephone Encounter (Signed)
Addressed through result notes  

## 2022-11-27 NOTE — Telephone Encounter (Signed)
Patient returned call regarding labs,she would like a return call.

## 2022-11-29 NOTE — Addendum Note (Signed)
Addended by: Roxy Manns A on: 11/29/2022 01:14 PM   Modules accepted: Orders

## 2022-12-01 ENCOUNTER — Ambulatory Visit (INDEPENDENT_AMBULATORY_CARE_PROVIDER_SITE_OTHER): Payer: HMO

## 2022-12-01 VITALS — Ht 66.0 in | Wt 186.0 lb

## 2022-12-01 DIAGNOSIS — Z0000 Encounter for general adult medical examination without abnormal findings: Secondary | ICD-10-CM | POA: Diagnosis not present

## 2022-12-01 NOTE — Progress Notes (Signed)
Subjective:   Suzanne Strong is a 77 y.o. female who presents for Medicare Annual (Subsequent) preventive examination.  Visit Complete: Virtual I connected with  Suzanne Strong on 12/01/22 by a audio enabled telemedicine application and verified that I am speaking with the correct person using two identifiers.  Patient Location: Home  Provider Location: Home Office  I discussed the limitations of evaluation and management by telemedicine. The patient expressed understanding and agreed to proceed.  Vital Signs: Because this visit was a virtual/telehealth visit, some criteria may be missing or patient reported. Any vitals not documented were not able to be obtained and vitals that have been documented are patient reported.  Patient Medicare AWV questionnaire was completed by the patient on 12/01/2022; I have confirmed that all information answered by patient is correct and no changes since this date.  Cardiac Risk Factors include: advanced age (>49men, >55 women);diabetes mellitus;dyslipidemia;hypertension     Objective:    Today's Vitals   12/01/22 1537  Weight: 186 lb (84.4 kg)  Height: 5\' 6"  (1.676 m)   Body mass index is 30.02 kg/m.     12/01/2022    3:41 PM 11/17/2022    1:22 PM 11/10/2022    4:36 PM 07/21/2022   10:31 AM 07/10/2022    6:13 PM 06/24/2022   11:33 AM 06/16/2022   12:53 PM  Advanced Directives  Does Patient Have a Medical Advance Directive? No No No No No No No  Would patient like information on creating a medical advance directive? Yes (MAU/Ambulatory/Procedural Areas - Information given)     No - Patient declined     Current Medications (verified) Outpatient Encounter Medications as of 12/01/2022  Medication Sig   acetaminophen (TYLENOL) 325 MG tablet Take 2 tablets (650 mg total) by mouth every 6 (six) hours as needed for mild pain (or Fever >/= 101).   albuterol (PROVENTIL) (2.5 MG/3ML) 0.083% nebulizer solution Take 3 mLs (2.5 mg total) by  nebulization in the morning and at bedtime.   albuterol (VENTOLIN HFA) 108 (90 Base) MCG/ACT inhaler Inhale 2 puffs into the lungs every 6 (six) hours as needed for wheezing or shortness of breath.   [START ON 12/07/2022] ALPRAZolam (XANAX) 0.5 MG tablet Take 1 tablet (0.5 mg total) by mouth daily as needed. for anxiety   amLODipine (NORVASC) 5 MG tablet Take 5 mg by mouth daily.   atorvastatin (LIPITOR) 10 MG tablet TAKE ONE TABLET BY MOUTH ONCE DAILY   b complex vitamins tablet Take 1 tablet by mouth daily.   BIOTIN PO Take by mouth.   busPIRone (BUSPAR) 15 MG tablet Take 1.5 tabs po BID   Cholecalciferol (VITAMIN D-1000 MAX ST) 25 MCG (1000 UT) tablet Take 2,000 Units by mouth daily. Takes three 50 mcg tablets, 3 capsules daily   Emollient (COLLAGEN EX)    famotidine (PEPCID) 20 MG tablet Take 1 tablet by mouth twice daily   ferrous sulfate 325 (65 FE) MG tablet Take 1 tablet (325 mg total) by mouth 2 (two) times daily with a meal.   glipiZIDE (GLUCOTROL) 5 MG tablet Take 2 tablets (10 mg total) by mouth daily before breakfast. (Patient taking differently: Take 10 mg by mouth 2 (two) times daily before a meal.)   glucose blood (ONETOUCH ULTRA) test strip USE TO check blood glucose ONCE DAILY AND AS NEEDED FOR DIABETES   losartan (COZAAR) 25 MG tablet Take 1 tablet (25 mg total) by mouth daily.   magnesium oxide (MAG-OX) 400 (240  Mg) MG tablet Take 1 tablet (400 mg total) by mouth 2 (two) times daily.   meloxicam (MOBIC) 15 MG tablet Take 1 tablet by mouth daily as needed for pain.   metFORMIN (GLUCOPHAGE) 500 MG tablet Take 2,000 mg by mouth daily with breakfast.   Multiple Vitamins-Minerals (WOMENS MULTIVITAMIN PO) Take by mouth.   omeprazole (PRILOSEC) 20 MG capsule TAKE ONE CAPSULE BY MOUTH EVERYDAY AT BEDTIME   pregabalin (LYRICA) 150 MG capsule Take 300 mg by mouth at bedtime.   rOPINIRole (REQUIP) 1 MG tablet TAKE 1/2 TABLET BY MOUTH EVERY MORNING and TAKE THREE TABLETS BY MOUTH  EVERYDAY AT BEDTIME   sertraline (ZOLOFT) 50 MG tablet Take 1/2 tab po qd x 2-4 days, then 1 tab po qd x 1 week, then 2 tabs po qd   Sodium Chloride (HYPERSAL) 3.5 % NEBU Take 1 each by nebulization 2 (two) times daily as needed.   zinc gluconate 50 MG tablet Take 50 mg by mouth daily.   Facility-Administered Encounter Medications as of 12/01/2022  Medication   influenza vaccine adjuvanted (FLUAD) injection 0.5 mL   sodium chloride flush (NS) 0.9 % injection 10 mL    Allergies (verified) Amoxicillin, Penicillin g, Fluconazole, Difuleron [ferrous fumarate-dss], and Other   History: Past Medical History:  Diagnosis Date   Anemia    iron deficiency and B12 deficiency   Anxiety    Breast cancer (HCC) 02/25/2015   upper-outer quadrant of left female breast, Triple Negative. Neo-adjuvant chemo with complete pathologic response.    Cervical dysplasia    conization   Diabetes mellitus without complication (HCC)    Diverticulosis    Dyspnea    Fever 04/11/2015   Gastroparesis    related to previous radiation tx   GERD (gastroesophageal reflux disease)    Hyperlipidemia    Hypertension    Neuromuscular disorder (HCC)    Neuropathy    legs/feet, S/P chemo meds   Personal history of chemotherapy    Personal history of radiation therapy 2017   LEFT lumpectomy w/ radiation   Pneumonia    RLS (restless legs syndrome)    Shingles    Hx of   Sleep apnea    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HCC) 2006   Wears dentures    partial bottom   Past Surgical History:  Procedure Laterality Date   APPENDECTOMY     BREAST BIOPSY Left 02/25/2015   INVASIVE MAMMARY CARCINOMA,Triple negative.   BREAST CYST ASPIRATION     BREAST EXCISIONAL BIOPSY Left 12/2015   surgery   BREAST LUMPECTOMY WITH NEEDLE LOCALIZATION Left 10/02/2015   Procedure: BREAST LUMPECTOMY WITH NEEDLE LOCALIZATION;  Surgeon: Earline Mayotte, MD;  Location: [facility];  Service: General;  Laterality: Left;   BREAST LUMPECTOMY WITH  SENTINEL LYMPH NODE BIOPSY Left 10/02/2015   Procedure: LEFT BREAST WIDE EXCISION WITH SENTINEL LYMPH NODE BX;  Surgeon: Earline Mayotte, MD;  Location: [facility];  Service: General;  Laterality: Left;   BREAST SURGERY     breast biopsy benign   CARPAL TUNNEL RELEASE Right 05/19/2017   Procedure: CARPAL TUNNEL RELEASE;  Surgeon: Deeann Saint, MD;  Location: [facility];  Service: Orthopedics;  Laterality: Right;   CATARACT EXTRACTION W/PHACO Right 10/26/2018   Procedure: CATARACT EXTRACTION PHACO AND INTRAOCULAR LENS PLACEMENT (IOC) right diabetic;  Surgeon: Lockie Mola, MD;  Location[facility];  Service: Ophthalmology;  Laterality: Right;  Diabetic - oral meds cancer center been notified and will come   CATARACT EXTRACTION W/PHACO Left  11/16/2018   Procedure: CATARACT EXTRACTION PHACO AND INTRAOCULAR LENS PLACEMENT (IOC) LEFT DIABETIC  01:01.0  17.0%  10.37;  Surgeon: Lockie Mola, MD;  Location: [facility];  Service: Ophthalmology;  Laterality: Left;  Diabetic - oral meds Port-a-cath   CHOLECYSTECTOMY     Cologuard  07/22/2016   Negative   COLONOSCOPY WITH PROPOFOL N/A 05/24/2020   Procedure: COLONOSCOPY WITH PROPOFOL;  Surgeon: Pasty Spillers, MD;  Location: [facility];  Service: Endoscopy;  Laterality: N/A;   ESOPHAGOGASTRODUODENOSCOPY  07/2009   normal with few gastric polyps   ESOPHAGOGASTRODUODENOSCOPY (EGD) WITH PROPOFOL N/A 05/24/2020   Procedure: ESOPHAGOGASTRODUODENOSCOPY (EGD) WITH PROPOFOL;  Surgeon: Pasty Spillers, MD;  Location: [facility];  Service: Endoscopy;  Laterality: N/A;   FLEXIBLE BRONCHOSCOPY Bilateral 06/24/2022   Procedure: FLEXIBLE BRONCHOSCOPY;  Surgeon: [name], MD;  Location: [facility];  Service: Pulmonary;  Laterality: Bilateral;   MOLE REMOVAL     PORT-A-CATH REMOVAL     PORTACATH PLACEMENT     PORTACATH PLACEMENT Right 03/12/2015   Procedure: INSERTION PORT-A-CATH;  Surgeon: Earline Mayotte,  MD;  Location: [facility];  Service: General;  Laterality: Right;   RADICAL NECK DISSECTION     TONSILLECTOMY Left    cancer treated with chemo and radiation   TRIGGER FINGER RELEASE Right 05/19/2017   Procedure: RELEASE TRIGGER FINGER/A-1 PULLEY ring finger;  Surgeon: Deeann Saint, MD;  Location: [facility];  Service: Orthopedics;  Laterality: Right;   VIDEO BRONCHOSCOPY WITH ENDOBRONCHIAL ULTRASOUND Bilateral 06/24/2022   Procedure: VIDEO BRONCHOSCOPY WITH ENDOBRONCHIAL ULTRASOUND;  Surgeon: [name], MD;  Location: [facility];  Service: Pulmonary;  Laterality: Bilateral;   Family History  Problem Relation Age of Onset   Osteoporosis Mother    Hyperlipidemia Mother    Depression Mother    Cancer Mother        skin cancer ? basal cell and lung ca heavy smoker   Alcohol abuse Father    Cancer Father        skin CA ? basal cell   Heart disease Father        CHF   Hyperlipidemia Brother    Hypertension Brother    Breast cancer Neg Hx    Social History   Socioeconomic History   Marital status: Widowed    Spouse name: Not on file   Number of children: 0   Years of education: 14   Highest education level: Some college, no degree  Occupational History   Occupation: Conservator, museum/gallery  Tobacco Use   Smoking status: Former    Current packs/day: 0.00    Types: Cigarettes    Quit date: 1985    Years since quitting: 39.8   Smokeless tobacco: Never  Vaping Use   Vaping status: Never Used  Substance and Sexual Activity   Alcohol use: No    Alcohol/week: 0.0 standard drinks of alcohol   Drug use: No   Sexual activity: Not Currently  Other Topics Concern   Not on file  Social History Narrative   Lives with husband in a one story home.  No children.        Works as Catering manager for [employer].        Education: college.    Social Determinants of Health   Financial Resource Strain: Low Risk  (12/01/2022)   Overall Financial Resource Strain (CARDIA)    Difficulty of Paying  Living Expenses: Not hard at all  Recent Concern: Financial Resource Strain - High Risk (  11/07/2022)   Overall Financial Resource Strain (CARDIA)    Difficulty of Paying Living Expenses: Hard  Food Insecurity: No Food Insecurity (12/01/2022)   Hunger Vital Sign    Worried About Running Out of Food in the Last Year: Never true    Ran Out of Food in the Last Year: Never true  Transportation Needs: No Transportation Needs (12/01/2022)   PRAPARE - Administrator, Civil Service (Medical): No    Lack of Transportation (Non-Medical): No  Physical Activity: Sufficiently Active (12/01/2022)   Exercise Vital Sign    Days of Exercise per Week: 5 days    Minutes of Exercise per Session: 30 min  Recent Concern: Physical Activity - Inactive (11/07/2022)   Exercise Vital Sign    Days of Exercise per Week: 0 days    Minutes of Exercise per Session: 30 min  Stress: No Stress Concern Present (12/01/2022)   [employer] of Occupational Health - Occupational Stress Questionnaire    Feeling of Stress : Not at all  Recent Concern: Stress - Stress Concern Present (11/07/2022)   [employer] of Occupational Health - Occupational Stress Questionnaire    Feeling of Stress : To some extent  Social Connections: Moderately Isolated (12/01/2022)   Social Connection and Isolation Panel [NHANES]    Frequency of Communication with Friends and Family: More than three times a week    Frequency of Social Gatherings with Friends and Family: More than three times a week    Attends Religious Services: More than 4 times per year    Active Member of [employer] or Organizations: No    Attends Banker Meetings: Never    Marital Status: Widowed    Tobacco Counseling Counseling given: Not Answered   Clinical Intake:  Pre-visit preparation completed: Yes  Pain : No/denies pain     Nutritional Risks: None Diabetes: No  How often do you need to have someone help you when you read  instructions, pamphlets, or other written materials from your doctor or pharmacy?: 1 - Never  Interpreter Needed?: No  Information entered by :: Renie Ora, LPN   Activities of Daily Living    12/01/2022    3:41 PM 06/16/2022   12:54 PM  In your present state of health, do you have any difficulty performing the following activities:  Hearing? 0   Vision? 0   Difficulty concentrating or making decisions? 0   Walking or climbing stairs? 0   Dressing or bathing? 0   Doing errands, shopping? 0 0  Preparing Food and eating ? N   Using the Toilet? N   In the past six months, have you accidently leaked urine? N   Do you have problems with loss of bowel control? N   Managing your Medications? N   Managing your Finances? N   Housekeeping or managing your Housekeeping? N     Patient Care Team: Tower, [name], MD as PCP - General Byrnett, Merrily Pew, MD (General Surgery) Johney Maine, MD (Oncology) Glendale Chard, DO as Consulting Physician (Neurology) Earna Coder, MD as Consulting Physician (Oncology) Carmina Miller, MD as Radiation Oncologist (Radiation Oncology) Kathyrn Sheriff, [facility] (Inactive) as Pharmacist (Pharmacist) Tedd Sias Marlana Salvage, MD as Physician Assistant (Endocrinology)  Indicate any recent Medical Services you may have received from other than [facility] providers in the past year (date may be approximate).     Assessment:   This is a routine wellness examination for [facility].  Hearing/Vision screen Vision  Screening - Comments:: Wears rx glasses - up to date with routine eye exams with  [facility]   Goals Addressed             This Visit's Progress    DIET - EAT MORE FRUITS AND VEGETABLES         Depression Screen    12/01/2022    3:40 PM 11/10/2022   10:36 AM 08/18/2022   12:23 PM 05/21/2022    9:36 AM 04/06/2022    3:52 PM 01/29/2022   12:24 PM 11/27/2021    3:45 PM  PHQ 2/9 Scores  PHQ - 2 Score 0 0 0 0 0 0 0  PHQ- 9 Score 0 3 1  6        Fall Risk    12/01/2022    3:38 PM 11/10/2022   10:36 AM 08/18/2022   12:22 PM 05/21/2022    8:58 AM 04/06/2022    3:52 PM  Fall Risk   Falls in the past year? 0 1 1 1 1   Number falls in past yr: 0 1 1 0 1  Injury with Fall? 0 0 0 0 0  Risk for fall due to : No Fall Risks History of fall(s) History of fall(s) History of fall(s) History of fall(s)  Follow up Falls prevention discussed Falls evaluation completed Falls evaluation completed Falls evaluation completed;Education provided;Falls prevention discussed Falls evaluation completed    MEDICARE RISK AT HOME: Medicare Risk at Home Any stairs in or around the home?: Yes If so, are there any without handrails?: No Home free of loose throw rugs in walkways, pet beds, electrical cords, etc?: No Adequate lighting in your home to reduce risk of falls?: Yes Life alert?: No Use of a cane, walker or w/c?: No Grab bars in the bathroom?: Yes Shower chair or bench in shower?: Yes Elevated toilet seat or a handicapped toilet?: Yes  TIMED UP AND GO:  Was the test performed?  No    Cognitive Function:    07/15/2018    9:27 AM 06/29/2017    8:54 AM 06/25/2016    9:43 AM  MMSE - Mini Mental State Exam  Orientation to time 5 5 5   Orientation to Place 5 5 5   Registration 3 3 3   Attention/ Calculation 0 0 0  Recall 3 3 3   Language- name 2 objects 0 0 0  Language- repeat 1 1 1   Language- follow 3 step command 0 3 3  Language- read & follow direction 0 0 0  Write a sentence 0 0 0  Copy design 0 0 0  Total score 17 20 20         12/01/2022    3:42 PM 11/27/2021    3:48 PM 11/09/2020   10:49 AM  6CIT Screen  What Year? 0 points 0 points 0 points  What month? 0 points 0 points 0 points  What time? 0 points 0 points 0 points  Count back from 20 0 points 0 points 0 points  Months in reverse 0 points 0 points 0 points  Repeat phrase 0 points 0 points 0 points  Total Score 0 points 0 points 0 points    Immunizations Immunization  History  Administered Date(s) Administered   Fluad Quad(high Dose 65+) 11/26/2020   PFIZER(Purple Top)SARS-COV-2 Vaccination 03/30/2019, 04/20/2019, 11/11/2019   Pfizer Covid-19 Vaccine Bivalent Booster 68yrs & up 09/25/2020   Pneumococcal Conjugate-13 10/16/2014   Pneumococcal Polysaccharide-23 08/31/2012   Td 06/16/2001, 06/09/2022   Tdap 06/01/2012  TDAP status: Due, Education has been provided regarding the importance of this vaccine. Advised may receive this vaccine at local pharmacy or Health Dept. Aware to provide a copy of the vaccination record if obtained from local pharmacy or Health Dept. Verbalized acceptance and understanding.  Flu Vaccine status: Due, Education has been provided regarding the importance of this vaccine. Advised may receive this vaccine at local pharmacy or Health Dept. Aware to provide a copy of the vaccination record if obtained from local pharmacy or Health Dept. Verbalized acceptance and understanding.  Pneumococcal vaccine status: Up to date  Covid-19 vaccine status: Completed vaccines  Qualifies for Shingles Vaccine? Yes   Zostavax completed No   Shingrix Completed?: No.    Education has been provided regarding the importance of this vaccine. Patient has been advised to call insurance company to determine out of pocket expense if they have not yet received this vaccine. Advised may also receive vaccine at local pharmacy or Health Dept. Verbalized acceptance and understanding.  Screening Tests Health Maintenance  Topic Date Due   Diabetic kidney evaluation - Urine ACR  Never done   Zoster Vaccines- Shingrix (1 of 2) Never done   FOOT EXAM  07/06/2018   Pneumonia Vaccine 80+ Years old (3 of 3 - PPSV23 or PCV20) 10/16/2019   Colonoscopy  05/24/2021   HEMOGLOBIN A1C  06/11/2021   INFLUENZA VACCINE  05/17/2023 (Originally 09/17/2022)   COVID-19 Vaccine (5 - 2023-24 season) 11/26/2023 (Originally 10/18/2022)   MAMMOGRAM  05/15/2023   OPHTHALMOLOGY EXAM   05/18/2023   Diabetic kidney evaluation - eGFR measurement  11/17/2023   Medicare Annual Wellness (AWV)  12/01/2023   DTaP/Tdap/Td (4 - Td or Tdap) 06/08/2032   DEXA SCAN  Completed   Hepatitis C Screening  Completed   HPV VACCINES  Aged Out   Fecal DNA (Cologuard)  Discontinued    Health Maintenance  Health Maintenance Due  Topic Date Due   Diabetic kidney evaluation - Urine ACR  Never done   Zoster Vaccines- Shingrix (1 of 2) Never done   FOOT EXAM  07/06/2018   Pneumonia Vaccine 25+ Years old (3 of 3 - PPSV23 or PCV20) 10/16/2019   Colonoscopy  05/24/2021   HEMOGLOBIN A1C  06/11/2021    Colorectal cancer screening: No longer required.   Mammogram status: Ordered schedule 12/04/2022. Pt provided with contact info and advised to call to schedule appt.   Bone Density status: Ordered declined . Pt provided with contact info and advised to call to schedule appt.  Lung Cancer Screening: (Low Dose CT Chest recommended if Age 35-80 years, 20 pack-year currently smoking OR have quit w/in 15years.) does not qualify.   Lung Cancer Screening Referral: n/a  Additional Screening:  Hepatitis C Screening: does not qualify; Completed 06/25/2016  Vision Screening: Recommended annual ophthalmology exams for early detection of glaucoma and other disorders of the eye. Is the patient up to date with their annual eye exam?  Yes  Who is the provider or what is the name of the office in which the patient attends annual eye exams? [facility]  If pt is not established with a provider, would they like to be referred to a provider to establish care? No .   Dental Screening: Recommended annual dental exams for proper oral hygiene  Diabetic Foot Exam: Diabetic Foot Exam: Overdue, Pt has been advised about the importance in completing this exam. Pt is scheduled for diabetic foot exam on next office  visit .  Community  Resource Referral / Chronic Care Management: CRR required this visit?  No    CCM required this visit?  No     Plan:     I have personally reviewed and noted the following in the patient's chart:   Medical and social history Use of alcohol, tobacco or illicit drugs  Current medications and supplements including opioid prescriptions. Patient is not currently taking opioid prescriptions. Functional ability and status Nutritional status Physical activity Advanced directives List of other physicians Hospitalizations, surgeries, and ER visits in previous 12 months Vitals Screenings to include cognitive, depression, and falls Referrals and appointments  In addition, I have reviewed and discussed with patient certain preventive protocols, quality metrics, and best practice recommendations. A written personalized care plan for preventive services as well as general preventive health recommendations were provided to patient.     Lorrene Reid, LPN   16/11/9602   After Visit Summary: (MyChart) Due to this being a telephonic visit, the after visit summary with patients personalized plan was offered to patient via MyChart   Nurse Notes: none

## 2022-12-01 NOTE — Patient Instructions (Signed)
Suzanne Strong , Thank you .ahdifor taking time to come for your Medicare Wellness Visit. I appreciate your ongoing commitment to your health goals. Please review the following plan we discussed and let me know if I can assist you in the future.   Referrals/Orders/Follow-Ups/Clinician Recommendations: Aim for 30 minutes of exercise or brisk walking, 6-8 glasses of water, and 5 servings of fruits and vegetables each day.   This is a list of the screening recommended for you and due dates:  Health Maintenance  Topic Date Due   Yearly kidney health urinalysis for diabetes  Never done   Zoster (Shingles) Vaccine (1 of 2) Never done   Complete foot exam   07/06/2018   Pneumonia Vaccine (3 of 3 - PPSV23 or PCV20) 10/16/2019   Colon Cancer Screening  05/24/2021   Hemoglobin A1C  06/11/2021   Flu Shot  05/17/2023*   COVID-19 Vaccine (5 - 2023-24 season) 11/26/2023*   Mammogram  05/15/2023   Eye exam for diabetics  05/18/2023   Yearly kidney function blood test for diabetes  11/17/2023   Medicare Annual Wellness Visit  12/01/2023   DTaP/Tdap/Td vaccine (4 - Td or Tdap) 06/08/2032   DEXA scan (bone density measurement)  Completed   Hepatitis C Screening  Completed   HPV Vaccine  Aged Out   Cologuard (Stool DNA test)  Discontinued  *Topic was postponed. The date shown is not the original due date.    Advanced directives: (Provided) Advance directive discussed with you today. I have provided a copy for you to complete at home and have notarized. Once this is complete, please bring a copy in to our office so we can scan it into your chart. Information on Advanced Care Planning can be found at [facility] [place] Advance Health Care Directives Advance Health Care Directives ([email or web address]    Next Medicare Annual Wellness Visit scheduled for next year: Yes  Insert Preventive Care attachment Insert FALL PREVENTION attachment if needed

## 2022-12-02 ENCOUNTER — Other Ambulatory Visit: Payer: Self-pay | Admitting: Family Medicine

## 2022-12-02 ENCOUNTER — Encounter: Payer: Self-pay | Admitting: Internal Medicine

## 2022-12-04 DIAGNOSIS — J690 Pneumonitis due to inhalation of food and vomit: Secondary | ICD-10-CM | POA: Diagnosis not present

## 2022-12-07 ENCOUNTER — Ambulatory Visit: Payer: HMO | Admitting: Nurse Practitioner

## 2022-12-07 ENCOUNTER — Inpatient Hospital Stay: Payer: HMO

## 2022-12-07 ENCOUNTER — Other Ambulatory Visit: Payer: HMO

## 2022-12-11 ENCOUNTER — Other Ambulatory Visit: Payer: Self-pay | Admitting: Family Medicine

## 2022-12-15 ENCOUNTER — Other Ambulatory Visit: Payer: HMO

## 2022-12-15 ENCOUNTER — Emergency Department: Payer: HMO

## 2022-12-15 ENCOUNTER — Other Ambulatory Visit: Payer: Self-pay

## 2022-12-15 ENCOUNTER — Emergency Department
Admission: EM | Admit: 2022-12-15 | Discharge: 2022-12-15 | Disposition: A | Discharge status: Home / Self Care | Payer: HMO | Attending: Emergency Medicine | Admitting: Emergency Medicine

## 2022-12-15 DIAGNOSIS — I700 Atherosclerosis of aorta: Secondary | ICD-10-CM | POA: Diagnosis not present

## 2022-12-15 DIAGNOSIS — I959 Hypotension, unspecified: Secondary | ICD-10-CM | POA: Diagnosis not present

## 2022-12-15 DIAGNOSIS — N179 Acute kidney failure, unspecified: Secondary | ICD-10-CM | POA: Insufficient documentation

## 2022-12-15 DIAGNOSIS — R112 Nausea with vomiting, unspecified: Secondary | ICD-10-CM | POA: Diagnosis not present

## 2022-12-15 DIAGNOSIS — R42 Dizziness and giddiness: Secondary | ICD-10-CM | POA: Diagnosis not present

## 2022-12-15 DIAGNOSIS — E119 Type 2 diabetes mellitus without complications: Secondary | ICD-10-CM | POA: Diagnosis not present

## 2022-12-15 DIAGNOSIS — E860 Dehydration: Secondary | ICD-10-CM | POA: Diagnosis not present

## 2022-12-15 DIAGNOSIS — N390 Urinary tract infection, site not specified: Secondary | ICD-10-CM

## 2022-12-15 DIAGNOSIS — R531 Weakness: Secondary | ICD-10-CM | POA: Diagnosis not present

## 2022-12-15 DIAGNOSIS — Z0389 Encounter for observation for other suspected diseases and conditions ruled out: Secondary | ICD-10-CM | POA: Diagnosis not present

## 2022-12-15 DIAGNOSIS — R197 Diarrhea, unspecified: Secondary | ICD-10-CM | POA: Insufficient documentation

## 2022-12-15 DIAGNOSIS — E1159 Type 2 diabetes mellitus with other circulatory complications: Secondary | ICD-10-CM | POA: Diagnosis not present

## 2022-12-15 LAB — URINALYSIS, W/ REFLEX TO CULTURE (INFECTION SUSPECTED)
Bilirubin Urine: NEGATIVE
Glucose, UA: NEGATIVE mg/dL
Hgb urine dipstick: NEGATIVE
Ketones, ur: NEGATIVE mg/dL
Nitrite: NEGATIVE
Protein, ur: NEGATIVE mg/dL
Specific Gravity, Urine: 1.008 (ref 1.005–1.030)
Squamous Epithelial / HPF: 0 /[HPF] (ref 0–5)
pH: 6 (ref 5.0–8.0)

## 2022-12-15 LAB — COMPREHENSIVE METABOLIC PANEL
ALT: 13 U/L (ref 0–44)
AST: 23 U/L (ref 15–41)
Albumin: 3.9 g/dL (ref 3.5–5.0)
Alkaline Phosphatase: 62 U/L (ref 38–126)
Anion gap: 11 (ref 5–15)
BUN: 27 mg/dL — ABNORMAL HIGH (ref 8–23)
CO2: 26 mmol/L (ref 22–32)
Calcium: 9.3 mg/dL (ref 8.9–10.3)
Chloride: 99 mmol/L (ref 98–111)
Creatinine, Ser: 1.51 mg/dL — ABNORMAL HIGH (ref 0.44–1.00)
GFR, Estimated: 36 mL/min — ABNORMAL LOW (ref >60)
Glucose, Bld: 142 mg/dL — ABNORMAL HIGH (ref 70–99)
Potassium: 3.5 mmol/L (ref 3.5–5.1)
Sodium: 136 mmol/L (ref 135–145)
Total Bilirubin: 0.7 mg/dL (ref 0.3–1.2)
Total Protein: 8 g/dL (ref 6.5–8.1)

## 2022-12-15 LAB — HEMOGLOBIN A1C: Hemoglobin A1C: 6

## 2022-12-15 LAB — CBC
HCT: 37.4 % (ref 36.0–46.0)
Hemoglobin: 11.6 g/dL — ABNORMAL LOW (ref 12.0–15.0)
MCH: 25.4 pg — ABNORMAL LOW (ref 26.0–34.0)
MCHC: 31 g/dL (ref 30.0–36.0)
MCV: 82 fL (ref 80.0–100.0)
Platelets: 242 10*3/uL (ref 150–400)
RBC: 4.56 MIL/uL (ref 3.87–5.11)
RDW: 22.2 % — ABNORMAL HIGH (ref 11.5–15.5)
WBC: 8.1 10*3/uL (ref 4.0–10.5)
nRBC: 0 % (ref 0.0–0.2)

## 2022-12-15 LAB — LACTIC ACID, PLASMA
Lactic Acid, Venous: 2.1 mmol/L (ref 0.5–1.9)
Lactic Acid, Venous: 1.7 mmol/L (ref 0.5–1.9)

## 2022-12-15 LAB — MAGNESIUM: Magnesium: 1.5 mg/dL — ABNORMAL LOW (ref 1.7–2.4)

## 2022-12-15 LAB — CBG MONITORING, ED: Glucose-Capillary: 113 mg/dL — ABNORMAL HIGH (ref 70–99)

## 2022-12-15 MED ORDER — MAGNESIUM SULFATE 2 GM/50ML IV SOLN
2.0000 g | Freq: Once | INTRAVENOUS | Status: AC
  Administered 2022-12-15: 2 g via INTRAVENOUS
  Filled 2022-12-15: qty 50

## 2022-12-15 MED ORDER — SODIUM CHLORIDE 0.9 % IV BOLUS
1000.0000 mL | Freq: Once | INTRAVENOUS | Status: AC
  Administered 2022-12-15: 1000 mL via INTRAVENOUS

## 2022-12-15 MED ORDER — ONDANSETRON HCL 4 MG PO TABS: 4.0000 mg | ORAL_TABLET | Freq: Every day | ORAL | 0 refills | Status: DC | PRN

## 2022-12-15 MED ORDER — ONDANSETRON HCL 4 MG/2ML IJ SOLN: 4.0000 mg | Freq: Once | INTRAMUSCULAR | Status: DC

## 2022-12-15 NOTE — ED Notes (Signed)
EDP at [facility]. Pt endorses NVD. Denies fever or bleeding. PO intake/ had "ensure this am". BP s157. Improved.

## 2022-12-15 NOTE — ED Notes (Signed)
Pt alert, NAD, calm, interactive, resps e/u, speaking in clear complete sentences, skin W&D. IVF infusing. Pt denies pain. PT to CT on monitor and stretcher at this time. Pedal pulses palpable and strong.

## 2022-12-15 NOTE — ED Provider Notes (Addendum)
[facility] Provider Note    Event Date/Time   First MD Initiated Contact with Patient 12/15/22 1140     (approximate)   History   Dizziness   HPI  Suzanne Strong is a 77 y.o. female   Past medical history of iron deficiency anemia, hypomagnesemia, prior cancer in remission not undergoing treatment currently, diabetes and gastroparesis, who presents to the emergency department with orthostatic lightheadedness found to be hypotensive in clinic and sent to the emergency department for further evaluation.  She has ongoing nausea vomiting and diarrhea for 1 month.  She denies any recent antibiotic use or hospitalizations or foreign travel.  She denies any GI bleeding.  She has watery brown stool.  She has poor p.o. intake.  She denies any other focal infectious symptoms on review of systems including respiratory symptoms, chest pain, abdominal pain, urinary symptoms.  External Medical Documents Reviewed: Telephone encounter on 11/27/2022 documenting onset of nausea and diarrhea with subjective fever/chills.    Physical Exam   Triage Vital Signs: ED Triage Vitals  Encounter Vitals Group     BP 12/15/22 1127 (!) 76/43     Systolic BP Percentile --      Diastolic BP Percentile --      Pulse Rate 12/15/22 1126 80     Resp 12/15/22 1126 16     Temp 12/15/22 1126 97.8 F (36.6 C)     Temp Source 12/15/22 1126 Oral     SpO2 12/15/22 1126 98 %     Weight 12/15/22 1126 186 lb 1.1 oz (84.4 kg)     Height 12/15/22 1126 5\' 6"  (1.676 m)     Head Circumference --      Peak Flow --      Pain Score 12/15/22 1126 2     Pain Loc --      Pain Education --      Exclude from Growth Chart --     Most recent vital signs: Vitals:   12/15/22 1415 12/15/22 1430  BP: (!) 118/55 133/61  Pulse: 72 78  Resp: 18 17  Temp:    SpO2: 95% 99%    General: Awake, no distress.  CV:  Good peripheral perfusion.  Resp:  Normal effort.  Abd:  No distention.  Other:  She  appears dehydrated with dry cracked lips, dry mucous membranes, poor skin turgor.  Her abdomen is nondistended no masses palpated and is nontender to deep palpation in all quadrants.  She defers a rectal exam stating that she has had many workups for GI bleeding in the past that have been negative and assures me that she has had no melena or blood in the stools.  Her lungs are clear to auscultation bilaterally.  She is awake alert oriented following all instructions moving all extremities.   ED Results / Procedures / Treatments   Labs (all labs ordered are listed, but only abnormal results are displayed) Labs Reviewed  CBC - Abnormal; Notable for the following components:      Result Value   Hemoglobin 11.6 (*)    MCH 25.4 (*)    RDW 22.2 (*)    All other components within normal limits  MAGNESIUM - Abnormal; Notable for the following components:   Magnesium 1.5 (*)    All other components within normal limits  LACTIC ACID, PLASMA - Abnormal; Notable for the following components:   Lactic Acid, Venous 2.1 (*)    All other components within normal limits  COMPREHENSIVE METABOLIC PANEL - Abnormal; Notable for the following components:   Glucose, Bld 142 (*)    BUN 27 (*)    Creatinine, Ser 1.51 (*)    GFR, Estimated 36 (*)    All other components within normal limits  URINALYSIS, W/ REFLEX TO CULTURE (INFECTION SUSPECTED) - Abnormal; Notable for the following components:   Color, Urine STRAW (*)    APPearance CLEAR (*)    Leukocytes,Ua TRACE (*)    Bacteria, UA RARE (*)    All other components within normal limits  CBG MONITORING, ED - Abnormal; Notable for the following components:   Glucose-Capillary 113 (*)    All other components within normal limits  CULTURE, BLOOD (ROUTINE X 2)  CULTURE, BLOOD (ROUTINE X 2)  GASTROINTESTINAL PANEL BY PCR, STOOL (REPLACES STOOL CULTURE)  C DIFFICILE QUICK SCREEN W PCR REFLEX    LACTIC ACID, PLASMA     I ordered and reviewed the above  labs they are notable for her hemoglobin is 11.6.  Her magnesium is 1.5.  Her GFR is 36 compared to 53 last check earlier this month.  EKG  ED ECG REPORT I, Pilar Jarvis, the attending physician, personally viewed and interpreted this ECG.   Date: 12/15/2022  EKG Time: 1130  Rate: 82  Rhythm: sinus  Axis: nl  Intervals:none  ST&T Change: no stemi    RADIOLOGY I independently reviewed and interpreted CT of the head and see no obvious bleeding or midline shift I also reviewed radiologist's formal read.   PROCEDURES:  Critical Care performed: Yes, see critical care procedure note(s)  .Critical Care  Performed by: Pilar Jarvis, MD Authorized by: Pilar Jarvis, MD   Critical care provider statement:    Critical care time (minutes):  30   Critical care was time spent personally by me on the following activities:  Development of treatment plan with patient or surrogate, discussions with consultants, evaluation of patient's response to treatment, examination of patient, ordering and review of laboratory studies, ordering and review of radiographic studies, ordering and performing treatments and interventions, pulse oximetry, re-evaluation of patient's condition and review of old charts    MEDICATIONS ORDERED IN ED: Medications  ondansetron (ZOFRAN) injection 4 mg (4 mg Intravenous Not Given 12/15/22 1436)  sodium chloride 0.9 % bolus 1,000 mL (0 mLs Intravenous Stopped 12/15/22 1328)  magnesium sulfate IVPB 2 g 50 mL (0 g Intravenous Stopped 12/15/22 1436)     IMPRESSION / MDM / ASSESSMENT AND PLAN / ED COURSE  I reviewed the triage vital signs and the nursing notes.                                Patient's presentation is most consistent with acute presentation with potential threat to life or bodily function.  Differential diagnosis includes, but is not limited to, hypotension/shock due to severe dehydration, electrolyte disturbances, sepsis or infection due to UTI,  respiratory infection, intra-abdominal infection, infectious colitis   The patient is on the cardiac monitor to evaluate for evidence of arrhythmia and/or significant heart rate changes.  MDM: I think her orthostatic lightheadedness, hypotension is in the setting of her nausea vomiting and diarrhea over the last several weeks.  We will check electrolytes, renal function, give hydration, and also assess for other infectious causes though she has no focal infectious symptoms I doubt this is due to sepsis.  Her abdomen is soft and nontender I doubt surgical  abdominal pathologies.  Her blood pressure is already improving and her dizziness improving with 1 L of normal saline.  She has been compliant with her iron infusions we will check for her anemia today, doubt GI bleeding as she reports none.  I will order stool samples and C. difficile testing given the longevity of her diarrheal illness.  Give antiemetic.     -- Has not had a bowel movement while here.  Her blood pressures have normalized and remain normalized and she has stood to use the bathroom without any further lightheadedness.  Her magnesium has been improved from prior but is slightly slightly low so I gave her another IV magnesium dose here.  Her H&H is normal, improved from prior though this may reflect some hemoconcentration in the setting of dehydration, also reflected by her AKI.  Urine unchanged from prior with some rare bacteria no inflammatory changes in the setting of no urinary symptoms I doubt urinary tract infection.  Patient feels well and is eager to go home.  I spoke with her about admission as this would be warranted in the setting of severe dehydration, AKI, hypotension albeit resolved, but she would like to go home and hydrate and follow-up as an outpatient.  I asked her to stay for repeat lactic because it was initially elevated at 2.1.  If it is downtrending she will be discharged per her wishes but understands that if  the lactic is rising may signal ongoing malperfusion and she is willing to stay for admission.   FINAL CLINICAL IMPRESSION(S) / ED DIAGNOSES   Final diagnoses:  Nausea vomiting and diarrhea  AKI (acute kidney injury) (HCC)  Dehydration  Hypotension, unspecified hypotension type     Rx / DC Orders   ED Discharge Orders     None        Note:  This document was prepared using Dragon voice recognition software and may include unintentional dictation errors.    Pilar Jarvis, MD 12/15/22 1259    Pilar Jarvis, MD 12/15/22 [phone]

## 2022-12-15 NOTE — Discharge Instructions (Addendum)
You were very dehydrated.  Take Zofran as needed for nausea and make sure you drink plenty of fluids to stay well-hydrated-find Pedialyte or similar electrolyte rehydration formulas at local pharmacy and drink throughout the day.  Your magnesium levels were slightly low for which you got treatment in the emergency department, but the magnesium level does seem to be improving from prior testing.  Thank you for choosing [place] for your health care today!  Please see your primary doctor this week for a follow up appointment.   If you have any new, worsening, or unexpected symptoms call your doctor right away or come back to the emergency department for reevaluation.  It was my pleasure to care for you today.   Daneil Dan Modesto Charon, MD

## 2022-12-15 NOTE — ED Triage Notes (Signed)
Pt here after having dizziness after her endocrinology appt today. Pt states she feels weak as well. Pt hypotensive in triage. Pt also having lower back pain. Pt endorses nausea but denies VD.

## 2022-12-15 NOTE — ED Notes (Signed)
Back from CT, no changes. Alert, NAD, calm, interactive, resps e/u.

## 2022-12-16 ENCOUNTER — Other Ambulatory Visit: Payer: HMO

## 2022-12-16 ENCOUNTER — Telehealth: Payer: Self-pay | Admitting: Family Medicine

## 2022-12-16 NOTE — Telephone Encounter (Signed)
Patient was sent to the hospital yesterday and had a lot of labs done  Cancelled the lab appointment and would like a call if she needs to come in to have more labs done  [phone]

## 2022-12-16 NOTE — Telephone Encounter (Signed)
Aware She needs follow up in office  for the ER visit for the n/v/d and dehydration (the tried to admit her and she wanted to go home) We will need to repeat kidney labs/assess hydration and magnesium status   Thanks

## 2022-12-17 ENCOUNTER — Inpatient Hospital Stay: Payer: HMO

## 2022-12-17 ENCOUNTER — Inpatient Hospital Stay: Payer: HMO | Admitting: Nurse Practitioner

## 2022-12-17 NOTE — Telephone Encounter (Signed)
Patient has been scheduled

## 2022-12-20 LAB — CULTURE, BLOOD (ROUTINE X 2)
Culture: NO GROWTH
Culture: NO GROWTH
Special Requests: ADEQUATE

## 2022-12-21 ENCOUNTER — Encounter: Payer: Self-pay | Admitting: Family Medicine

## 2022-12-21 ENCOUNTER — Ambulatory Visit (INDEPENDENT_AMBULATORY_CARE_PROVIDER_SITE_OTHER): Payer: HMO | Admitting: Family Medicine

## 2022-12-21 VITALS — BP 116/60 | HR 75 | Temp 98.2°F | Ht 66.0 in | Wt 177.1 lb

## 2022-12-21 DIAGNOSIS — E1142 Type 2 diabetes mellitus with diabetic polyneuropathy: Secondary | ICD-10-CM | POA: Diagnosis not present

## 2022-12-21 DIAGNOSIS — K3184 Gastroparesis: Secondary | ICD-10-CM

## 2022-12-21 DIAGNOSIS — Z7984 Long term (current) use of oral hypoglycemic drugs: Secondary | ICD-10-CM

## 2022-12-21 DIAGNOSIS — I10 Essential (primary) hypertension: Secondary | ICD-10-CM

## 2022-12-21 DIAGNOSIS — R55 Syncope and collapse: Secondary | ICD-10-CM

## 2022-12-21 DIAGNOSIS — I951 Orthostatic hypotension: Secondary | ICD-10-CM | POA: Diagnosis not present

## 2022-12-21 DIAGNOSIS — R197 Diarrhea, unspecified: Secondary | ICD-10-CM

## 2022-12-21 LAB — CBC WITH DIFFERENTIAL/PLATELET
Basophils Absolute: 0 10*3/uL (ref 0.0–0.1)
Basophils Relative: 0.6 % (ref 0.0–3.0)
Eosinophils Absolute: 0.2 10*3/uL (ref 0.0–0.7)
Eosinophils Relative: 2.2 % (ref 0.0–5.0)
HCT: 34.1 % — ABNORMAL LOW (ref 36.0–46.0)
Hemoglobin: 10.7 g/dL — ABNORMAL LOW (ref 12.0–15.0)
Lymphocytes Relative: 14.9 % (ref 12.0–46.0)
Lymphs Abs: 1.1 10*3/uL (ref 0.7–4.0)
MCHC: 31.2 g/dL (ref 30.0–36.0)
MCV: 82 fL (ref 78.0–100.0)
Monocytes Absolute: 0.5 10*3/uL (ref 0.1–1.0)
Monocytes Relative: 6.5 % (ref 3.0–12.0)
Neutro Abs: 5.8 10*3/uL (ref 1.4–7.7)
Neutrophils Relative %: 75.8 % (ref 43.0–77.0)
Platelets: 212 10*3/uL (ref 150.0–400.0)
RBC: 4.16 Mil/uL (ref 3.87–5.11)
RDW: 24.4 % — ABNORMAL HIGH (ref 11.5–15.5)
WBC: 7.6 10*3/uL (ref 4.0–10.5)

## 2022-12-21 LAB — COMPREHENSIVE METABOLIC PANEL
ALT: 7 U/L (ref 0–35)
AST: 13 U/L (ref 0–37)
Albumin: 3.6 g/dL (ref 3.5–5.2)
Alkaline Phosphatase: 55 U/L (ref 39–117)
BUN: 22 mg/dL (ref 6–23)
CO2: 28 meq/L (ref 19–32)
Calcium: 9.1 mg/dL (ref 8.4–10.5)
Chloride: 103 meq/L (ref 96–112)
Creatinine, Ser: 1.27 mg/dL — ABNORMAL HIGH (ref 0.40–1.20)
GFR: 41.5 mL/min — ABNORMAL LOW (ref >60.00)
Glucose, Bld: 102 mg/dL — ABNORMAL HIGH (ref 70–99)
Potassium: 4.2 meq/L (ref 3.5–5.1)
Sodium: 138 meq/L (ref 135–145)
Total Bilirubin: 0.4 mg/dL (ref 0.2–1.2)
Total Protein: 7 g/dL (ref 6.0–8.3)

## 2022-12-21 LAB — MAGNESIUM: Magnesium: 1.2 mg/dL — ABNORMAL LOW (ref 1.5–2.5)

## 2022-12-21 MED ORDER — ONDANSETRON HCL 4 MG PO TABS: 4.0000 mg | ORAL_TABLET | Freq: Every day | ORAL | 1 refills | Status: AC | PRN

## 2022-12-21 NOTE — Assessment & Plan Note (Signed)
This occurred in setting of acute on chronic n/v/d  Reviewed hospital records, lab results and studies in detail    No longer orthostatic Clinical improvement with hydration and cessation of vomiting Labs planned

## 2022-12-21 NOTE — Assessment & Plan Note (Signed)
This may add to n/v but doubt adding to diarrhea Has GI appointment upcoming

## 2022-12-21 NOTE — Progress Notes (Signed)
Subjective:    Patient ID: Suzanne Strong, female    DOB: [birth year], 77 y.o.   MRN: [id number]  HPI  Wt Readings from Last 3 Encounters:  12/21/22 177 lb 2 oz (80.3 kg)  12/15/22 186 lb 1.1 oz (84.4 kg)  12/01/22 186 lb (84.4 kg)   28.59 kg/m  Vitals:   12/21/22 1012  BP: 116/60  Pulse: 75  Temp: 98.2 F (36.8 C)  SpO2: 96%    Pt presents for follow up of ER visit for n/v/d and orthostatic hypotension  10/29 Was in Dr [employer] office- and pre syncopal/ felt like she lost control of limbs   Treated with zofran, IVF, magnesium IV  Of note lactic acid level was rising and it was recommended she stay for admission  She declines this   Per note: MDM: I think her orthostatic lightheadedness, hypotension is in the setting of her nausea vomiting and diarrhea over the last several weeks.  We will check electrolytes, renal function, give hydration, and also assess for other infectious causes though she has no focal infectious symptoms I doubt this is due to sepsis.  Her abdomen is soft and nontender I doubt surgical abdominal pathologies.  Her blood pressure is already improving and her dizziness improving with 1 L of normal saline.  She has been compliant with her iron infusions we will check for her anemia today, doubt GI bleeding as she reports none.   I will order stool samples and C. difficile testing given the longevity of her diarrheal illness.  Give antiemetic.  Never did the stool tests   Lab Results  Component Value Date   NA 136 12/15/2022   K 3.5 12/15/2022   CO2 26 12/15/2022   GLUCOSE 142 (H) 12/15/2022   BUN 27 (H) 12/15/2022   CREATININE 1.51 (H) 12/15/2022   CALCIUM 9.3 12/15/2022   GFR 56.01 (L) 11/10/2022   GFRNONAA 36 (L) 12/15/2022   Lab Results  Component Value Date   ALT 13 12/15/2022   AST 23 12/15/2022   ALKPHOS 62 12/15/2022   BILITOT 0.7 12/15/2022   Lab Results  Component Value Date   WBC 8.1 12/15/2022   HGB 11.6 (L) 12/15/2022    HCT 37.4 12/15/2022   MCV 82.0 12/15/2022   PLT 242 12/15/2022   Lactic acid elevated at 2.1   Magnesium 1.5   Urinalysis trace leuk   CT head-normal CXR-chronic interstitial changes  No change on EKG or monitor  Now Doing well  Taking zofran still -needs refill   Sat/Sunday-spit up phlegm but no vomiting   Stool is loose, but not watery   Drinking pedialyte for dehydration  Bottles are way more than 16 oz  2-3 12 oz servings of water   Not urinating often despite drinking a lot    Taking mag ox one daily with food Could not tolerate 2 daily     Patient Active Problem List   Diagnosis Date Noted   Type 2 diabetes mellitus with diabetic polyneuropathy, unspecified whether long term insulin use (HCC) 12/21/2022   Change in mental status 11/10/2022   Jerking 11/05/2022   Bronchiectasis without complication (HCC) 07/31/2022   Diarrhea 07/22/2022   Lymphadenopathy 06/24/2022   Poor balance 04/28/2022   Vision changes 02/12/2022   Polypharmacy 02/12/2022   At high risk for aspiration 04/23/2021   Recurrent aspiration pneumonia (HCC) 12/09/2020   Dysuria 07/17/2020   Dysphagia    Gastric erythema    Gastric polyp  Encounter for screening colonoscopy    Polyp of colon    History of aspiration pneumonia 02/15/2020   Diabetes mellitus without complication (HCC)    GERD (gastroesophageal reflux disease)    Hypertension    Urinary urgency 01/05/2020   Grief reaction 09/18/2019   Swallowing dysfunction 07/18/2019   Persistent cough for 3 weeks or longer 02/21/2019   Fever, intermittent 09/01/2018   Pre-syncope 02/07/2018   Orthostatic hypotension 01/26/2018   Episodic weakness 01/26/2018   Medicare annual wellness visit, subsequent 06/29/2017   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   Preoperative examination 04/27/2017   Carpal tunnel syndrome of right wrist 11/13/2016   Obstructive sleep apnea 01/01/2016   Carcinoma of upper-outer quadrant of  left breast in female, estrogen receptor negative (HCC) 11/21/2015   Hypomagnesemia 08/28/2015   Initial Medicare annual wellness visit 10/16/2014   Estrogen deficiency 10/16/2014   Colon cancer screening 10/16/2014   Controlled type 2 diabetes mellitus without complication (HCC) 03/28/2014   RLS (restless legs syndrome) 02/21/2014   Chronic neck pain 07/25/2010   Depression with anxiety 07/07/2010   Routine general medical examination at a health care facility 06/26/2010   B12 deficiency 09/04/2009   Anemia, iron deficiency 08/29/2009   Anxiety state 08/26/2009   Gastroparesis 08/26/2009   Vitamin D deficiency 07/19/2008   Disorder of bone and cartilage 07/19/2007   Hyperlipidemia 07/18/2007   Edema 07/18/2007   Past Medical History:  Diagnosis Date   Allergy 1999   Anemia    iron deficiency and B12 deficiency   Anxiety    Arthritis 2022   some in my knees   Breast cancer (HCC) 02/25/2015   upper-outer quadrant of left female breast, Triple Negative. Neo-adjuvant chemo with complete pathologic response.    Cataract Floating cataracts 2018   Cervical dysplasia    conization   Chronic kidney disease 2024   questionable   Diabetes mellitus without complication (HCC)    Diverticulosis    Dyspnea    Fever 04/11/2015   Gastroparesis    related to previous radiation tx   GERD (gastroesophageal reflux disease)    Hyperlipidemia    Hypertension    Neuromuscular disorder (HCC)    Neuropathy    legs/feet, S/P chemo meds   Personal history of chemotherapy    Personal history of radiation therapy 2017   LEFT lumpectomy w/ radiation   Pneumonia    RLS (restless legs syndrome)    Seizures (HCC) 2022   Shingles    Hx of   Sleep apnea    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HCC) 2006   Wears dentures    partial bottom   Past Surgical History:  Procedure Laterality Date   APPENDECTOMY     BREAST BIOPSY Left 02/25/2015   INVASIVE MAMMARY CARCINOMA,Triple negative.   BREAST CYST ASPIRATION      BREAST EXCISIONAL BIOPSY Left 12/2015   surgery   BREAST LUMPECTOMY WITH NEEDLE LOCALIZATION Left 10/02/2015   Procedure: BREAST LUMPECTOMY WITH NEEDLE LOCALIZATION;  Surgeon: Earline Mayotte, MD;  Location: [facility];  Service: General;  Laterality: Left;   BREAST LUMPECTOMY WITH SENTINEL LYMPH NODE BIOPSY Left 10/02/2015   Procedure: LEFT BREAST WIDE EXCISION WITH SENTINEL LYMPH NODE BX;  Surgeon: Earline Mayotte, MD;  Location: [facility];  Service: General;  Laterality: Left;   BREAST SURGERY     breast biopsy benign   CARPAL TUNNEL RELEASE Right 05/19/2017   Procedure: CARPAL TUNNEL RELEASE;  Surgeon: Deeann Saint, MD;  Location: [facility];  Service: Orthopedics;  Laterality: Right;   CATARACT EXTRACTION W/PHACO Right 10/26/2018   Procedure: CATARACT EXTRACTION PHACO AND INTRAOCULAR LENS PLACEMENT (IOC) right diabetic;  Surgeon: Lockie Mola, MD;  Location: [facility];  Service: Ophthalmology;  Laterality: Right;  Diabetic - oral meds cancer center been notified and will come   CATARACT EXTRACTION W/PHACO Left 11/16/2018   Procedure: CATARACT EXTRACTION PHACO AND INTRAOCULAR LENS PLACEMENT (IOC) LEFT DIABETIC  01:01.0  17.0%  10.37;  Surgeon: Lockie Mola, MD;  Location: [facility];  Service: Ophthalmology;  Laterality: Left;  Diabetic - oral meds Port-a-cath   CHOLECYSTECTOMY     Cologuard  07/22/2016   Negative   COLONOSCOPY WITH PROPOFOL N/A 05/24/2020   Procedure: COLONOSCOPY WITH PROPOFOL;  Surgeon: Pasty Spillers, MD;  Location: [facility];  Service: Endoscopy;  Laterality: N/A;   ESOPHAGOGASTRODUODENOSCOPY  07/2009   normal with few gastric polyps   ESOPHAGOGASTRODUODENOSCOPY (EGD) WITH PROPOFOL N/A 05/24/2020   Procedure: ESOPHAGOGASTRODUODENOSCOPY (EGD) WITH PROPOFOL;  Surgeon: Pasty Spillers, MD;  Location: [facility];  Service: Endoscopy;  Laterality: N/A;   EYE SURGERY  10/26/2018   FLEXIBLE BRONCHOSCOPY Bilateral  06/24/2022   Procedure: FLEXIBLE BRONCHOSCOPY;  Surgeon: [name], MD;  Location: [facility];  Service: Pulmonary;  Laterality: Bilateral;   FRACTURE SURGERY  1948 ish   broke my left arm as a child   MOLE REMOVAL     PORT-A-CATH REMOVAL     PORTACATH PLACEMENT     PORTACATH PLACEMENT Right 03/12/2015   Procedure: INSERTION PORT-A-CATH;  Surgeon: Earline Mayotte, MD;  Location: [facility];  Service: General;  Laterality: Right;   RADICAL NECK DISSECTION     TONSILLECTOMY Left    cancer treated with chemo and radiation   TRIGGER FINGER RELEASE Right 05/19/2017   Procedure: RELEASE TRIGGER FINGER/A-1 PULLEY ring finger;  Surgeon: Deeann Saint, MD;  Location: [facility];  Service: Orthopedics;  Laterality: Right;   VIDEO BRONCHOSCOPY WITH ENDOBRONCHIAL ULTRASOUND Bilateral 06/24/2022   Procedure: VIDEO BRONCHOSCOPY WITH ENDOBRONCHIAL ULTRASOUND;  Surgeon: [name], MD;  Location: [facility];  Service: Pulmonary;  Laterality: Bilateral;   Social History   Tobacco Use   Smoking status: Former    Current packs/day: 0.00    Average packs/day: 0.5 packs/day for 40.0 years (20.0 ttl pk-yrs)    Types: Cigarettes    Quit date: 90    Years since quitting: 39.8   Smokeless tobacco: Never   Tobacco comments:    Not much of a smoker, peer pressure mainly  Vaping Use   Vaping status: Never Used  Substance Use Topics   Alcohol use: No   Drug use: No   Family History  Problem Relation Age of Onset   Osteoporosis Mother    Hyperlipidemia Mother    Depression Mother    Cancer Mother        skin cancer ? basal cell and lung ca heavy smoker   Anxiety disorder Mother    COPD Mother    Drug abuse Mother    Miscarriages / [place] Mother    Varicose Veins Mother    Alcohol abuse Father    Cancer Father        skin CA ? basal cell   Heart disease Father        CHF   Hearing loss Father    Hyperlipidemia Brother    Hypertension Brother    Breast cancer Neg Hx    Allergies   Allergen Reactions   Amoxicillin  Swelling    REACTION: rash, swelling Has patient had a PCN reaction causing immediate rash, facial/tongue/throat swelling, SOB or lightheadedness with hypotension: yes Has patient had a PCN reaction causing severe rash involving mucus membranes or skin necrosis: no Has patient had a PCN reaction that required hospitalization: no Has patient had a PCN reaction occurring within the last 10 years: yes If all of the above answers are "NO", then may proceed with Cephalosporin use.  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     albuterol (PROVENTIL) (2.5 MG/3ML) 0.083% nebulizer solution Take 3 mLs (2.5 mg total) by nebulization in the morning and at bedtime. 180 mL 11   albuterol (VENTOLIN HFA) 108 (90 Base) MCG/ACT inhaler Inhale 2 puffs into the lungs every 6 (six) hours as needed for wheezing or shortness of breath. 8 g 3   ALPRAZolam (XANAX) 0.5 MG tablet Take 1 tablet (0.5 mg total) by mouth daily as needed. for anxiety 30 tablet 0   amitriptyline (ELAVIL) 50 MG tablet Take 50 mg by mouth at bedtime.     amLODipine (NORVASC) 5 MG tablet Take 5 mg by mouth daily.     atorvastatin (LIPITOR) 10 MG tablet TAKE ONE TABLET BY MOUTH ONCE DAILY 90 tablet 3   b complex vitamins tablet Take 1 tablet by mouth daily.     BIOTIN PO Take by mouth.     busPIRone (BUSPAR) 15 MG tablet Take 1.5 tabs po BID 270 tablet 1   Cholecalciferol (VITAMIN D-1000 MAX ST) 25 MCG (1000 UT) tablet Take 2,000 Units  by mouth daily. Takes three 50 mcg tablets, 3 capsules daily     Emollient (COLLAGEN EX)      famotidine (PEPCID) 20 MG tablet Take 1 tablet by mouth twice daily 180 tablet 1   glipiZIDE (GLUCOTROL) 5 MG tablet Take 2 tablets (10 mg total) by mouth daily before breakfast. (Patient taking differently: Take 10 mg by mouth 2 (two) times daily before a meal.)     glucose blood (ONETOUCH ULTRA) test strip USE TO check blood glucose ONCE DAILY AND AS NEEDED FOR DIABETES 100 strip 1   losartan (COZAAR) 25 MG tablet Take 1 tablet by mouth once daily 90 tablet 0   magnesium oxide (MAG-OX) 400 (240 Mg) MG tablet Take 1 tablet (400 mg total) by mouth 2 (two) times daily. (Patient taking differently: Take 400 mg by mouth daily.) 60 tablet 1   metFORMIN (GLUCOPHAGE) 500 MG tablet Take 2,000 mg by mouth daily with breakfast.     Multiple Vitamins-Minerals (WOMENS MULTIVITAMIN PO) Take by mouth.     omeprazole (PRILOSEC) 20 MG capsule TAKE ONE CAPSULE BY MOUTH EVERYDAY AT BEDTIME 90 capsule 3   pregabalin (LYRICA) 150 MG capsule Take 300 mg by mouth at bedtime.     rOPINIRole (REQUIP) 1 MG tablet TAKE 1/2 TABLET BY MOUTH EVERY MORNING and TAKE THREE TABLETS BY MOUTH EVERYDAY AT BEDTIME 315 tablet 3   sertraline (  ZOLOFT) 50 MG tablet Take 1/2 tab po qd x 2-4 days, then 1 tab po qd x 1 week, then 2 tabs po qd 60 tablet 1   Sodium Chloride (HYPERSAL) 3.5 % NEBU Take 1 each by nebulization 2 (two) times daily as needed. 240 mL 5   zinc gluconate 50 MG tablet Take 50 mg by mouth daily.     Current Facility-Administered Medications on File Prior to Visit  Medication Dose Route Frequency Provider Last Rate Last Admin   influenza vaccine adjuvanted (FLUAD) injection 0.5 mL  0.5 mL Intramuscular Once Louretta Shorten R, MD       sodium chloride flush (NS) 0.9 % injection 10 mL  10 mL Intravenous PRN Earna Coder, MD   10 mL at 09/11/15 0845    Review of Systems  Constitutional:  Positive for fatigue.  Negative for activity change, appetite change, fever and unexpected weight change.  HENT:  Positive for trouble swallowing. Negative for congestion, ear pain, rhinorrhea, sinus pressure and sore throat.   Eyes:  Negative for pain, redness and visual disturbance.  Respiratory:  Negative for cough, shortness of breath and wheezing.   Cardiovascular:  Negative for chest pain and palpitations.  Gastrointestinal:  Positive for diarrhea and nausea. Negative for abdominal distention, abdominal pain, blood in stool, constipation and vomiting.  Endocrine: Negative for polydipsia and polyuria.  Genitourinary:  Negative for dysuria, frequency and urgency.  Musculoskeletal:  Negative for arthralgias, back pain and myalgias.  Skin:  Negative for pallor and rash.  Allergic/Immunologic: Negative for environmental allergies.  Neurological:  Negative for dizziness, syncope and headaches.  Hematological:  Negative for adenopathy. Does not bruise/bleed easily.  Psychiatric/Behavioral:  Positive for sleep disturbance. Negative for decreased concentration and dysphoric mood. The patient is not nervous/anxious.        Objective:   Physical Exam Constitutional:      General: She is not in acute distress.    Appearance: Normal appearance. She is well-developed and normal weight. She is not ill-appearing or diaphoretic.  HENT:     Head: Normocephalic and atraumatic.     Right Ear: Tympanic membrane and ear canal normal.     Left Ear: Tympanic membrane normal.     Mouth/Throat:     Mouth: Mucous membranes are moist.  Eyes:     General: No scleral icterus.       Left eye: No discharge.     Conjunctiva/sclera: Conjunctivae normal.     Pupils: Pupils are equal, round, and reactive to light.  Neck:     Thyroid: No thyromegaly.     Vascular: No carotid bruit or JVD.  Cardiovascular:     Rate and Rhythm: Normal rate and regular rhythm.     Heart sounds: Normal heart sounds.     No gallop.  Pulmonary:      Effort: Pulmonary effort is normal. No respiratory distress.     Breath sounds: Normal breath sounds. No wheezing or rales.  Abdominal:     General: There is no distension or abdominal bruit.     Palpations: Abdomen is soft. There is no mass.     Tenderness: There is no abdominal tenderness. There is no right CVA tenderness, left CVA tenderness, guarding or rebound.     Hernia: No hernia is present.  Musculoskeletal:     Cervical back: Normal range of motion and neck supple. No tenderness.     Right lower leg: No edema.     Left lower leg:  No edema.  Lymphadenopathy:     Cervical: No cervical adenopathy.  Skin:    General: Skin is warm and dry.     Coloration: Skin is not pale.     Findings: No rash.  Neurological:     Mental Status: She is alert.     Coordination: Coordination normal.     Deep Tendon Reflexes: Reflexes are normal and symmetric. Reflexes normal.  Psychiatric:        Attention and Perception: Attention normal.        Mood and Affect: Mood is anxious.        Cognition and Memory: Cognition and memory normal.     Comments: Mildly anxious  Pleasant  Candidly discusses symptoms and stressors             Assessment & Plan:   Problem List Items Addressed This Visit       Cardiovascular and Mediastinum   Hypertension    Recent ortho hypotension with dehydration  Much improved now  BP: 116/60  Amlodipine 5 mg daily  Losartan 25 mg daily       Orthostatic hypotension    This occurred in setting of n/v/d in ER with dehydration Reviewed hospital records, lab results and studies in detail  Much improved clinically  Vomiting has stopped  Is hydrating -discussed importance of this and may need more fluid than what she is getting based on urinary volume  Lab today  Blood pressure is in normal range       Pre-syncope    This occurred in setting of acute on chronic n/v/d  Reviewed hospital records, lab results and studies in detail    No longer  orthostatic Clinical improvement with hydration and cessation of vomiting Labs planned          Digestive   Gastroparesis    This may add to n/v but doubt adding to diarrhea Has GI appointment upcoming         Endocrine   Type 2 diabetes mellitus with diabetic polyneuropathy, unspecified whether long term insulin use (HCC)    Continues care with endocrinology  Wlll re schedule visit   ? If metfomrin adding to diarrhea       Relevant Medications   amitriptyline (ELAVIL) 50 MG tablet     Other   Diarrhea - Primary    Recent ER visit for n/v/d Per pt subacute   Reviewed hospital records, lab results and studies in detail   Clinically improved but not resolved Does take metformin  Also mag ox   Lab today  Did order stool pcr and cdiff planned in ER but not done   Has GI appointment upcoming      Relevant Orders   Comprehensive metabolic panel   CBC with Differential/Platelet   C. difficile GDH and Toxin A/B   GI Profile, Stool, PCR   Hypomagnesemia    Can only tolerate mag ox once daily  Was given mag in ER  Level today      Relevant Orders   Magnesium

## 2022-12-21 NOTE — Assessment & Plan Note (Signed)
Can only tolerate mag ox once daily  Was given mag in ER  Level today

## 2022-12-21 NOTE — Patient Instructions (Addendum)
Aim for at least 60 oz of fluids (water and pedialyte)   Take care of yourself If symptoms worsen let us know   Follow up with GI as planned   Labs today  I want to get stool tests to rule out infection also   I sent in zofran for nausea to use as needed

## 2022-12-21 NOTE — Assessment & Plan Note (Signed)
Recent ER visit for n/v/d Per pt subacute   Reviewed hospital records, lab results and studies in detail   Clinically improved but not resolved Does take metformin  Also mag ox   Lab today  Did order stool pcr and cdiff planned in ER but not done   Has GI appointment upcoming

## 2022-12-21 NOTE — Assessment & Plan Note (Signed)
Continues care with endocrinology  Wlll re schedule visit   ? If metfomrin adding to diarrhea

## 2022-12-21 NOTE — Assessment & Plan Note (Signed)
This occurred in setting of n/v/d in ER with dehydration Reviewed hospital records, lab results and studies in detail  Much improved clinically  Vomiting has stopped  Is hydrating -discussed importance of this and may need more fluid than what she is getting based on urinary volume  Lab today  Blood pressure is in normal range

## 2022-12-21 NOTE — Assessment & Plan Note (Signed)
Recent ortho hypotension with dehydration  Much improved now  BP: 116/60  Amlodipine 5 mg daily  Losartan 25 mg daily

## 2022-12-22 ENCOUNTER — Ambulatory Visit (INDEPENDENT_AMBULATORY_CARE_PROVIDER_SITE_OTHER): Payer: HMO | Admitting: Psychiatry

## 2022-12-22 ENCOUNTER — Other Ambulatory Visit: Payer: Self-pay

## 2022-12-22 ENCOUNTER — Encounter: Payer: Self-pay | Admitting: Pharmacist

## 2022-12-22 ENCOUNTER — Encounter: Payer: Self-pay | Admitting: Psychiatry

## 2022-12-22 DIAGNOSIS — R197 Diarrhea, unspecified: Secondary | ICD-10-CM | POA: Diagnosis not present

## 2022-12-22 DIAGNOSIS — G4700 Insomnia, unspecified: Secondary | ICD-10-CM

## 2022-12-22 DIAGNOSIS — F411 Generalized anxiety disorder: Secondary | ICD-10-CM | POA: Diagnosis not present

## 2022-12-22 MED ORDER — MIRTAZAPINE 15 MG PO TABS: 15.0000 mg | ORAL_TABLET | Freq: Every day | ORAL | 1 refills | Status: DC

## 2022-12-22 NOTE — Progress Notes (Unsigned)
Suzanne Strong [id number] 24-Jul-1945 77 y.o.  Subjective:   Patient ID:  Suzanne Strong is a 77 y.o. (DOB 1945-12-24) female.  Chief Complaint: No chief complaint on file.   HPI Suzanne Strong presents to the office today for follow-up of anxiety, depression, and insomnia.    She reports that her magnesium levels and hemoglobin dropped. She reports that she had seizures recently during a medical appointment and reports that she was dehydrated. Pt noted to have n/v/d and orthostatic hypotension. She reports having orthostatic hypotension and possible seizure activity "off and on over the years"  She reports losing 40 lbs in about a month. She reports poor appetite. She reports that her sleep has been poor and she estimates sleeping about 3-4 hours. She reports taking Xanax prn on occasion, "4 out of 7 days."   She reports that her anxiety is "not any worse, except at night when I cannot sleep." She reports that she goes to bed at a consistent time and takes her medication ab hour before bedtime. She reports that she does not doze off until 4:30 am and is up at 7:30-8 am. She reports that insomnia and neuropathy may have worsened with coming off Amitriptyline and Pregabalin. She reports that her mood is "good, I think considering all" that has happened recently. She reports fatigue and chronic low energy. She reports that her motivation is there- "I try." Concentration is adequate. 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 Denies SI.   She reports that diarrhea proceeded re-start of Sertraline.   She reports that god-daughter and god-daughter's mother have been offering support. She has a brother that is also supportive. She reports that people in her church are supportive.   Past Psychiatric Medication Trials: Remeron Xanax Gabapentin- Unsure if this is helpful for RLS Lyrica Ropinorole- Helpful for RLS Zoloft Buspar Lyrica Trazodone-Ineffective. Caused  unsteadiness.  Amitriptyline-Helps with sleep initiation. Does not notice benefit for pain.  Belsomra-Effective.  Temazepam-caused vivid dreams. Helped with sleep initiation and did not help with sleep maintenance.     GAD-7    Flowsheet Row Office Visit from 12/21/2022 in [facility] Visit from 11/10/2022 in [facility] Office Visit from 08/18/2022 in [facility] Office Visit from 05/21/2022 in [facility] at [facility] Office Visit from 04/06/2022 in [facility] at [facility]  Total GAD-7 Score 2 3 3  0 4      Mini-Mental    Flowsheet Row Clinical Support from 07/15/2018 in [facility] Clinical Support from 06/29/2017 in [facility] Clinical Support from 06/25/2016 in [facility]  Total Score (max 30 points ) 17 20 20       PHQ2-9    Flowsheet Row Office Visit from 12/21/2022 in [facility] at [facility] Clinical Support from 12/01/2022 in [facility] Office Visit from 11/10/2022 in [facility] Office Visit from 08/18/2022 in [facility] at [facility] Visit from 05/21/2022 in [facility] at [facility]  PHQ-2 Total Score 0 0 0 0 0  PHQ-9 Total Score 8 0 3 1 --      Flowsheet Row ED from 12/15/2022 in [name] Emergency Department at [facility] ED from 11/10/2022  in [facility] Department at [facility] ED from 07/10/2022 in [facility] Emergency Department at [facility]  C-SSRS RISK CATEGORY No Risk No Risk No Risk        Review of Systems:  Review of Systems  Constitutional:  Positive for fatigue.  Gastrointestinal:  Positive for diarrhea, nausea and vomiting.   Musculoskeletal:  Negative for gait problem.  Neurological:        Reports feeling light-headed upon standing.   Psychiatric/Behavioral:         Please refer to HPI    Medications: I have reviewed the patient's current medications.  Current Outpatient Medications  Medication Sig Dispense Refill   acetaminophen (TYLENOL) 325 MG tablet Take 2 tablets (650 mg total) by mouth every 6 (six) hours as needed for mild pain (or Fever >/= 101).     albuterol (PROVENTIL) (2.5 MG/3ML) 0.083% nebulizer solution Take 3 mLs (2.5 mg total) by nebulization in the morning and at bedtime. 180 mL 11   albuterol (VENTOLIN HFA) 108 (90 Base) MCG/ACT inhaler Inhale 2 puffs into the lungs every 6 (six) hours as needed for wheezing or shortness of breath. 8 g 3   ALPRAZolam (XANAX) 0.5 MG tablet Take 1 tablet (0.5 mg total) by mouth daily as needed. for anxiety 30 tablet 0   amitriptyline (ELAVIL) 50 MG tablet Take 50 mg by mouth at bedtime.     amLODipine (NORVASC) 5 MG tablet Take 5 mg by mouth daily.     atorvastatin (LIPITOR) 10 MG tablet TAKE ONE TABLET BY MOUTH ONCE DAILY 90 tablet 3   b complex vitamins tablet Take 1 tablet by mouth daily.     BIOTIN PO Take by mouth.     busPIRone (BUSPAR) 15 MG tablet Take 1.5 tabs po BID 270 tablet 1   Cholecalciferol (VITAMIN D-1000 MAX ST) 25 MCG (1000 UT) tablet Take 2,000 Units by mouth daily. Takes three 50 mcg tablets, 3 capsules daily     Emollient (COLLAGEN EX)      famotidine (PEPCID) 20 MG tablet Take 1 tablet by mouth twice daily 180 tablet 1   glipiZIDE (GLUCOTROL) 5 MG tablet Take 2 tablets (10 mg total) by mouth daily before breakfast. (Patient taking differently: Take 10 mg by mouth 2 (two) times daily before a meal.)     glucose blood (ONETOUCH ULTRA) test strip USE TO check blood glucose ONCE DAILY AND AS NEEDED FOR DIABETES 100 strip 1   losartan (COZAAR) 25 MG tablet Take 1 tablet by mouth once daily 90 tablet 0   magnesium oxide (MAG-OX) 400 (240  Mg) MG tablet Take 1 tablet (400 mg total) by mouth 2 (two) times daily. (Patient taking differently: Take 400 mg by mouth daily.) 60 tablet 1   metFORMIN (GLUCOPHAGE) 500 MG tablet Take 2,000 mg by mouth daily with breakfast.     Multiple Vitamins-Minerals (WOMENS MULTIVITAMIN PO) Take by mouth.     omeprazole (PRILOSEC) 20 MG capsule TAKE ONE CAPSULE BY MOUTH EVERYDAY AT BEDTIME 90 capsule 3   ondansetron (ZOFRAN) 4 MG tablet Take 1 tablet (4 mg total) by mouth daily as needed. 20 tablet 1   pregabalin (LYRICA) 150 MG capsule Take 300 mg by mouth at bedtime.     rOPINIRole (REQUIP) 1 MG tablet TAKE 1/2 TABLET BY MOUTH EVERY MORNING and TAKE THREE TABLETS BY MOUTH EVERYDAY AT BEDTIME 315 tablet 3   sertraline (ZOLOFT) 50 MG tablet Take 1/2 tab po qd x 2-4 days, then  1 tab po qd x 1 week, then 2 tabs po qd 60 tablet 1   Sodium Chloride (HYPERSAL) 3.5 % NEBU Take 1 each by nebulization 2 (two) times daily as needed. 240 mL 5   zinc gluconate 50 MG tablet Take 50 mg by mouth daily.     No current facility-administered medications for this visit.   Facility-Administered Medications Ordered in Other Visits  Medication Dose Route Frequency Provider Last Rate Last Admin   influenza vaccine adjuvanted (FLUAD) injection 0.5 mL  0.5 mL Intramuscular Once Louretta Shorten R, MD       sodium chloride flush (NS) 0.9 % injection 10 mL  10 mL Intravenous PRN Earna Coder, MD   10 mL at 09/11/15 0845    Medication Side Effects: None  Allergies:  Allergies  Allergen Reactions   Amoxicillin Swelling    REACTION: rash, swelling Has patient had a PCN reaction causing immediate rash, facial/tongue/throat swelling, SOB or lightheadedness with hypotension: yes Has patient had a PCN reaction causing severe rash involving mucus membranes or skin necrosis: no Has patient had a PCN reaction that required hospitalization: no Has patient had a PCN reaction occurring within the last 10 years: yes If all  of the above answers are "NO", then may proceed with Cephalosporin use.  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 Neo-adjuvant chemo with complete pathologic response.    Cataract Floating cataracts 2018   Cervical dysplasia    conization   Chronic kidney disease 2024   questionable   Diabetes mellitus without complication (HCC)    Diverticulosis    Dyspnea    Fever 04/11/2015   Gastroparesis    related to previous radiation tx   GERD (gastroesophageal reflux disease)    Hyperlipidemia    Hypertension    Neuromuscular disorder (HCC)    Neuropathy    legs/feet, S/P chemo meds   Personal history of chemotherapy    Personal history of radiation therapy 2017   LEFT lumpectomy w/ radiation   Pneumonia    RLS (restless legs syndrome)    Seizures (HCC) 2022   Shingles    Hx of   Sleep apnea    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HCC) 2006   Wears dentures    partial bottom    Past Medical History, Surgical history, Social history, and Family history were reviewed and updated as appropriate.   Please see review of systems for further details on the patient's review from today.   Objective:   Physical Exam:  There were no vitals taken for this visit.  Physical Exam  Lab Review:     Component Value Date/Time   NA 138 12/21/2022 1052   NA 142 03/01/2014 0846   K 4.2 12/21/2022 1052   K 4.3 03/01/2014 0846   CL 103 12/21/2022 1052   CL 102 03/01/2014 0846   CO2 28 12/21/2022 1052   CO2  28  03/01/2014 0846   GLUCOSE 102 (H) 12/21/2022 1052   GLUCOSE 219 (H) 03/01/2014 0846   BUN 22 12/21/2022 1052   BUN 12 03/01/2014 0846   CREATININE 1.27 (H) 12/21/2022 1052   CREATININE 1.09 (H) 11/17/2022 1312   CREATININE 0.98 11/13/2022 1608   CALCIUM 9.1 12/21/2022 1052   CALCIUM 8.6 03/01/2014 0846   PROT 7.0 12/21/2022 1052   PROT 7.5 03/01/2014 0846   ALBUMIN 3.6 12/21/2022 1052   ALBUMIN 3.4 03/01/2014 0846   AST 13 12/21/2022 1052   AST 17 11/17/2022 1312   ALT 7 12/21/2022 1052   ALT 9 11/17/2022 1312   ALT 28 03/01/2014 0846   ALKPHOS 55 12/21/2022 1052   ALKPHOS 122 (H) 03/01/2014 0846   BILITOT 0.4 12/21/2022 1052   BILITOT 0.3 11/17/2022 1312   GFRNONAA 36 (L) 12/15/2022 1133   GFRNONAA 53 (L) 11/17/2022 1312   GFRNONAA >60 03/01/2014 0846   GFRNONAA >60 08/24/2013 1015   GFRAA >60 10/20/2019 0950   GFRAA >60 03/01/2014 0846   GFRAA >60 08/24/2013 1015       Component Value Date/Time   WBC 7.6 12/21/2022 1052   RBC 4.16 12/21/2022 1052   HGB 10.7 (L) 12/21/2022 1052   HGB 7.4 (L) 11/17/2022 1312   HGB 11.5 (L) 03/01/2014 0846   HCT 34.1 (L) 12/21/2022 1052   HCT 36.4 03/01/2014 0846   PLT 212.0 12/21/2022 1052   PLT 242 11/17/2022 1312   PLT 254 03/01/2014 0846   MCV 82.0 12/21/2022 1052   MCV 81 03/01/2014 0846   MCH 25.4 (L) 12/15/2022 1133   MCHC 31.2 12/21/2022 1052   RDW 24.4 (H) 12/21/2022 1052   RDW 15.6 (H) 03/01/2014 0846   LYMPHSABS 1.1 12/21/2022 1052   LYMPHSABS 1.0 03/01/2014 0846   MONOABS 0.5 12/21/2022 1052   MONOABS 0.3 03/01/2014 0846   EOSABS 0.2 12/21/2022 1052   EOSABS 0.1 03/01/2014 0846   BASOSABS 0.0 12/21/2022 1052   BASOSABS 0.0 03/01/2014 0846    No results found for: "POCLITH", "LITHIUM"   No results found for: "PHENYTOIN", "PHENOBARB", "VALPROATE", "CBMZ"   .res Assessment: Plan:    There are no diagnoses linked to this encounter.   Please see After Visit Summary for patient specific instructions.  Future  Appointments  Date Time Provider Department Center  12/24/2022 11:40 AM [facility] MM [facility]2 [facility]  12/29/2022  1:30 PM [facility] LAB [facility] None  12/29/2022  2:00 PM Alinda Dooms, NP [facility] None  12/29/2022  2:30 PM [facility] INFUSION CHAIR 7 [facility] None  01/04/2023  2:00 PM Celso Amy, PA-C [facility] None  12/06/2023  3:00 PM [facility] ANNUAL WELLNESS VISIT 1 [facility] PEC    No orders of the defined types were placed in this encounter.   -------------------------------

## 2022-12-22 NOTE — Telephone Encounter (Signed)
Thank you Lillia Abed!  Shapale-please make sure she got this info and picks up the lower dose magnesium to dose twice daily  I want to re check lab in 1-2 weeks  If mag level gets lower that could be very dangerous

## 2022-12-22 NOTE — Patient Instructions (Signed)
Please check to see if you are currently taking Amitriptyline and/or Pregabalin (Lyrica).

## 2022-12-23 LAB — C. DIFFICILE GDH AND TOXIN A/B
GDH ANTIGEN: NOT DETECTED
MICRO NUMBER:: 15688483
SPECIMEN QUALITY:: ADEQUATE
TOXIN A AND B: NOT DETECTED

## 2022-12-23 NOTE — Telephone Encounter (Signed)
Called pt and she did see Lindsay's message. She wants to try taking the magnesium that she has and take 1/2 tab QID. Dr. Milinda Antis advised me that is okay as long as the pharmacy said she is able to cut med in 1/2. I advise pt to check with pharmacy and if she is able to then cut med in 1/2 and take QID. Pt verbalized understanding. Pt also advise if she isn't able to cut pills in 1/2 then stick with Lindsay's plan she gave pt in [place] message.  Pt also declined to schedule lab appt she said she will call back when she is ready to schedule appt

## 2022-12-24 ENCOUNTER — Ambulatory Visit
Admission: RE | Admit: 2022-12-24 | Discharge: 2022-12-24 | Disposition: A | Discharge status: Home / Self Care | Payer: HMO | Source: Ambulatory Visit | Attending: Nurse Practitioner | Admitting: Nurse Practitioner

## 2022-12-24 DIAGNOSIS — Z1231 Encounter for screening mammogram for malignant neoplasm of breast: Secondary | ICD-10-CM | POA: Diagnosis not present

## 2022-12-24 DIAGNOSIS — Z853 Personal history of malignant neoplasm of breast: Secondary | ICD-10-CM | POA: Diagnosis not present

## 2022-12-24 DIAGNOSIS — Z08 Encounter for follow-up examination after completed treatment for malignant neoplasm: Secondary | ICD-10-CM | POA: Diagnosis not present

## 2022-12-24 LAB — GI PROFILE, STOOL, PCR

## 2022-12-24 LAB — SPECIMEN STATUS REPORT

## 2022-12-28 ENCOUNTER — Other Ambulatory Visit: Payer: Self-pay | Admitting: *Deleted

## 2022-12-28 DIAGNOSIS — Z171 Estrogen receptor negative status [ER-]: Secondary | ICD-10-CM

## 2022-12-29 ENCOUNTER — Inpatient Hospital Stay: Payer: HMO | Attending: Internal Medicine

## 2022-12-29 ENCOUNTER — Inpatient Hospital Stay: Payer: HMO

## 2022-12-29 ENCOUNTER — Encounter: Payer: Self-pay | Admitting: Nurse Practitioner

## 2022-12-29 ENCOUNTER — Inpatient Hospital Stay (HOSPITAL_BASED_OUTPATIENT_CLINIC_OR_DEPARTMENT_OTHER): Payer: HMO | Admitting: Nurse Practitioner

## 2022-12-29 VITALS — BP 100/50 | HR 75

## 2022-12-29 VITALS — BP 150/78 | HR 87 | Temp 97.0°F | Wt 181.0 lb

## 2022-12-29 DIAGNOSIS — R600 Localized edema: Secondary | ICD-10-CM | POA: Diagnosis not present

## 2022-12-29 DIAGNOSIS — D649 Anemia, unspecified: Secondary | ICD-10-CM | POA: Diagnosis not present

## 2022-12-29 DIAGNOSIS — Z85818 Personal history of malignant neoplasm of other sites of lip, oral cavity, and pharynx: Secondary | ICD-10-CM | POA: Diagnosis not present

## 2022-12-29 DIAGNOSIS — Z87891 Personal history of nicotine dependence: Secondary | ICD-10-CM | POA: Diagnosis not present

## 2022-12-29 DIAGNOSIS — C50412 Malignant neoplasm of upper-outer quadrant of left female breast: Secondary | ICD-10-CM

## 2022-12-29 DIAGNOSIS — M8580 Other specified disorders of bone density and structure, unspecified site: Secondary | ICD-10-CM | POA: Insufficient documentation

## 2022-12-29 DIAGNOSIS — Z853 Personal history of malignant neoplasm of breast: Secondary | ICD-10-CM | POA: Insufficient documentation

## 2022-12-29 DIAGNOSIS — D509 Iron deficiency anemia, unspecified: Secondary | ICD-10-CM

## 2022-12-29 DIAGNOSIS — M899 Disorder of bone, unspecified: Secondary | ICD-10-CM

## 2022-12-29 LAB — CBC WITH DIFFERENTIAL (CANCER CENTER ONLY)
Abs Immature Granulocytes: 0.02 10*3/uL (ref 0.00–0.07)
Basophils Absolute: 0 10*3/uL (ref 0.0–0.1)
Basophils Relative: 0 %
Eosinophils Absolute: 0.2 10*3/uL (ref 0.0–0.5)
Eosinophils Relative: 3 %
HCT: 35.1 % — ABNORMAL LOW (ref 36.0–46.0)
Hemoglobin: 10.8 g/dL — ABNORMAL LOW (ref 12.0–15.0)
Immature Granulocytes: 0 %
Lymphocytes Relative: 17 %
Lymphs Abs: 1.2 10*3/uL (ref 0.7–4.0)
MCH: 26.3 pg (ref 26.0–34.0)
MCHC: 30.8 g/dL (ref 30.0–36.0)
MCV: 85.6 fL (ref 80.0–100.0)
Monocytes Absolute: 0.4 10*3/uL (ref 0.1–1.0)
Monocytes Relative: 5 %
Neutro Abs: 5.2 10*3/uL (ref 1.7–7.7)
Neutrophils Relative %: 75 %
Platelet Count: 273 10*3/uL (ref 150–400)
RBC: 4.1 MIL/uL (ref 3.87–5.11)
RDW: 21.1 % — ABNORMAL HIGH (ref 11.5–15.5)
WBC Count: 7 10*3/uL (ref 4.0–10.5)
nRBC: 0 % (ref 0.0–0.2)

## 2022-12-29 LAB — CMP (CANCER CENTER ONLY)
ALT: 11 U/L (ref 0–44)
AST: 21 U/L (ref 15–41)
Albumin: 3.9 g/dL (ref 3.5–5.0)
Alkaline Phosphatase: 60 U/L (ref 38–126)
Anion gap: 10 (ref 5–15)
BUN: 15 mg/dL (ref 8–23)
CO2: 23 mmol/L (ref 22–32)
Calcium: 8.6 mg/dL — ABNORMAL LOW (ref 8.9–10.3)
Chloride: 107 mmol/L (ref 98–111)
Creatinine: 0.94 mg/dL (ref 0.44–1.00)
GFR, Estimated: >60 mL/min (ref >60)
Glucose, Bld: 100 mg/dL — ABNORMAL HIGH (ref 70–99)
Potassium: 4.4 mmol/L (ref 3.5–5.1)
Sodium: 140 mmol/L (ref 135–145)
Total Bilirubin: 0.3 mg/dL (ref <1.2)
Total Protein: 7.4 g/dL (ref 6.5–8.1)

## 2022-12-29 MED ORDER — IRON SUCROSE 20 MG/ML IV SOLN
200.0000 mg | Freq: Once | INTRAVENOUS | Status: AC
Start: 2022-12-29 — End: 2022-12-29
  Administered 2022-12-29: 200 mg via INTRAVENOUS
  Filled 2022-12-29: qty 10

## 2022-12-29 NOTE — Patient Instructions (Signed)
Iron Sucrose Injection What is this medication? IRON SUCROSE (EYE ern SOO krose) treats low levels of iron (iron deficiency anemia) in people with kidney disease. Iron is a mineral that plays an important role in making red blood cells, which carry oxygen from your lungs to the rest of your body. This medicine may be used for other purposes; ask your health care provider or pharmacist if you have questions. COMMON BRAND NAME(S): Venofer What should I tell my care team before I take this medication? They need to know if you have any of these conditions: Anemia not caused by low iron levels Heart disease High levels of iron in the blood Kidney disease Liver disease An unusual or allergic reaction to iron, other medications, foods, dyes, or preservatives Pregnant or trying to get pregnant Breastfeeding How should I use this medication? This medication is for infusion into a vein. It is given in a hospital or clinic setting. Talk to your care team about the use of this medication in children. While this medication may be prescribed for children as young as 2 years for selected conditions, precautions do apply. Overdosage: If you think you have taken too much of this medicine contact a poison control center or emergency room at once. NOTE: This medicine is only for you. Do not share this medicine with others. What if I miss a dose? Keep appointments for follow-up doses. It is important not to miss your dose. Call your care team if you are unable to keep an appointment. What may interact with this medication? Do not take this medication with any of the following: Deferoxamine Dimercaprol Other iron products This medication may also interact with the following: Chloramphenicol Deferasirox This list may not describe all possible interactions. Give your health care provider a list of all the medicines, herbs, non-prescription drugs, or dietary supplements you use. Also tell them if you smoke,  drink alcohol, or use illegal drugs. Some items may interact with your medicine. What should I watch for while using this medication? Visit your care team regularly. Tell your care team if your symptoms do not start to get better or if they get worse. You may need blood work done while you are taking this medication. You may need to follow a special diet. Talk to your care team. Foods that contain iron include: whole grains/cereals, dried fruits, beans, or peas, leafy green vegetables, and organ meats (liver, kidney). What side effects may I notice from receiving this medication? Side effects that you should report to your care team as soon as possible: Allergic reactions--skin rash, itching, hives, swelling of the face, lips, tongue, or throat Low blood pressure--dizziness, feeling faint or lightheaded, blurry vision Shortness of breath Side effects that usually do not require medical attention (report to your care team if they continue or are bothersome): Flushing Headache Joint pain Muscle pain Nausea Pain, redness, or irritation at injection site This list may not describe all possible side effects. Call your doctor for medical advice about side effects. You may report side effects to FDA at 1-800-FDA-1088. Where should I keep my medication? This medication is given in a hospital or clinic. It will not be stored at home. NOTE: This sheet is a summary. It may not cover all possible information. If you have questions about this medicine, talk to your doctor, pharmacist, or health care provider.  2024 Elsevier/Gold Standard (2022-07-10 00:00:00)

## 2022-12-29 NOTE — Progress Notes (Signed)
[facility] OFFICE PROGRESS NOTE  Patient Care Team: Tower, [name], MD as PCP - General Byrnett, Merrily Pew, MD (General Surgery) Johney Maine, MD (Oncology) Glendale Chard, DO as Consulting Physician (Neurology) Earna Coder, MD as Consulting Physician (Oncology) Carmina Miller, MD as Radiation Oncologist (Radiation Oncology) Kathyrn Sheriff, [facility] (Inactive) as Pharmacist (Pharmacist) Raj Janus, MD as Physician Assistant (Endocrinology)   Cancer Staging  No matching staging information was found for the patient.  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  5 CM TUMOR MASS BIOPSIES POSITIVE FOR INVASIVE MAMMARY CARCINOMA TRIPLE NEGATIVE DISEASE(jANUARY, 2017)stage IIIa. ER/PR/her2 Neu-NEGATIVE  # Cytoxan and Adriamycin from March 18, 2015; carbo-taxol x4 cycles  # AUg 18th s/p Lumpec & SLNBx- Complete path CR [ypT0 ypsn0] s/p RT [oct 2017; Dr.Crystal]  3.  MUGA scan of the heart shows ejection fraction to be 61% (January, 2017)   EGD/colonoscopy- Morene Antu 2022]-  ------------------------------------------------------------    DIAGNOSIS: BREAST CANCER  STAGE:  III       ;GOALS: curative  CURRENT/MOST RECENT THERAPY: surveillaince.     Carcinoma of upper-outer quadrant of left breast in female, estrogen receptor negative (HCC)  11/21/2015 Initial Diagnosis   Carcinoma of upper-outer quadrant of left breast in female, estrogen receptor negative (HCC)     INTERVAL HISTORY: Ambulating independently.  Alone.  Suzanne Strong 77 y.o. female pleasant patient above history of triple negative breast cancer stage III currently on surveillance is here for follow-up of her anemia. She has tolerated IV iron well. She feels that  her neuropathy is worse since she had medication changes and isn'[t sleeping well. She says that she collapsed while at [facility] for appointment and possible 'seizure'. She was taken to ER and received IV fluids and magnesium. Denies dark stools. Says that she has lost weight but now stabilized. Diarrhea mostly resolved. Eating and drinking more normally.   Review of Systems  Constitutional:  Positive for malaise/fatigue. Negative for chills, diaphoresis, fever and weight loss.  HENT:  Negative for nosebleeds and sore throat.   Eyes:  Negative for double vision.  Respiratory:  Negative for hemoptysis, shortness of breath and wheezing.   Cardiovascular:  Negative for chest pain, palpitations, orthopnea and leg swelling.  Gastrointestinal:  Positive for melena. Negative for abdominal pain, blood in stool, constipation, diarrhea, heartburn, nausea and vomiting.  Genitourinary:  Negative for dysuria, frequency, hematuria and urgency.  Musculoskeletal:  Negative for back pain, joint pain, myalgias and neck pain.  Skin: Negative.  Negative for itching and rash.  Neurological:  Positive for tingling, seizures (per notes - syncope) and loss of consciousness. Negative for dizziness, focal weakness, weakness and headaches.  Endo/Heme/Allergies:  Does not bruise/bleed easily.  Psychiatric/Behavioral:  Negative for depression. The patient has insomnia. The patient is not nervous/anxious.      PAST MEDICAL HISTORY :  Past Medical History:  Diagnosis Date   Allergy 1999   Anemia    iron deficiency and B12 deficiency   Anxiety    Arthritis 2022   some in my knees   Breast cancer (HCC) 02/25/2015   upper-outer quadrant of left female breast, Triple Negative. Neo-adjuvant chemo with complete pathologic response.    Cataract Floating cataracts 2018   Cervical dysplasia    conization   Chronic  kidney disease 2024   questionable   Diabetes mellitus without complication (HCC)    Diverticulosis    Dyspnea     Fever 04/11/2015   Gastroparesis    related to previous radiation tx   GERD (gastroesophageal reflux disease)    Hyperlipidemia    Hypertension    Neuromuscular disorder (HCC)    Neuropathy    legs/feet, S/P chemo meds   Personal history of chemotherapy    Personal history of radiation therapy 2017   LEFT lumpectomy w/ radiation   Pneumonia    RLS (restless legs syndrome)    Seizures (HCC) 2022   Shingles    Hx of   Sleep apnea    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HCC) 2006   Wears dentures    partial bottom    PAST SURGICAL HISTORY :   Past Surgical History:  Procedure Laterality Date   APPENDECTOMY     BREAST BIOPSY Left 02/25/2015   INVASIVE MAMMARY CARCINOMA,Triple negative.   BREAST CYST ASPIRATION     BREAST EXCISIONAL BIOPSY Left 12/2015   surgery   BREAST LUMPECTOMY WITH NEEDLE LOCALIZATION Left 10/02/2015   Procedure: BREAST LUMPECTOMY WITH NEEDLE LOCALIZATION;  Surgeon: Earline Mayotte, MD;  Location: [facility];  Service: General;  Laterality: Left;   BREAST LUMPECTOMY WITH SENTINEL LYMPH NODE BIOPSY Left 10/02/2015   Procedure: LEFT BREAST WIDE EXCISION WITH SENTINEL LYMPH NODE BX;  Surgeon: Earline Mayotte, MD;  Location: [facility];  Service: General;  Laterality: Left;   BREAST SURGERY     breast biopsy benign   CARPAL TUNNEL RELEASE Right 05/19/2017   Procedure: CARPAL TUNNEL RELEASE;  Surgeon: Deeann Saint, MD;  Location: [facility];  Service: Orthopedics;  Laterality: Right;   CATARACT EXTRACTION W/PHACO Right 10/26/2018   Procedure: CATARACT EXTRACTION PHACO AND INTRAOCULAR LENS PLACEMENT (IOC) right diabetic;  Surgeon: Lockie Mola, MD;  Location: [facility];  Service: Ophthalmology;  Laterality: Right;  Diabetic - oral meds cancer center been notified and will come   CATARACT EXTRACTION W/PHACO Left 11/16/2018   Procedure: CATARACT EXTRACTION PHACO AND INTRAOCULAR LENS PLACEMENT (IOC) LEFT DIABETIC  01:01.0  17.0%  10.37;  Surgeon: Lockie Mola, MD;  Location: [facility];  Service: Ophthalmology;  Laterality: Left;  Diabetic - oral meds Port-a-cath   CHOLECYSTECTOMY     Cologuard  07/22/2016   Negative   COLONOSCOPY WITH PROPOFOL N/A 05/24/2020   Procedure: COLONOSCOPY WITH PROPOFOL;  Surgeon: Pasty Spillers, MD;  Location: [facility];  Service: Endoscopy;  Laterality: N/A;   ESOPHAGOGASTRODUODENOSCOPY  07/2009   normal with few gastric polyps   ESOPHAGOGASTRODUODENOSCOPY (EGD) WITH PROPOFOL N/A 05/24/2020   Procedure: ESOPHAGOGASTRODUODENOSCOPY (EGD) WITH PROPOFOL;  Surgeon: Pasty Spillers, MD;  Location: [facility];  Service: Endoscopy;  Laterality: N/A;   EYE SURGERY  10/26/2018   FLEXIBLE BRONCHOSCOPY Bilateral 06/24/2022   Procedure: FLEXIBLE BRONCHOSCOPY;  Surgeon: [name], MD;  Location: [facility];  Service: Pulmonary;  Laterality: Bilateral;   FRACTURE SURGERY  1948 ish   broke my left arm as a child   MOLE REMOVAL     PORT-A-CATH REMOVAL     PORTACATH PLACEMENT     PORTACATH PLACEMENT Right 03/12/2015   Procedure: INSERTION PORT-A-CATH;  Surgeon: Earline Mayotte, MD;  Location: [facility];  Service: General;  Laterality: Right;   RADICAL NECK DISSECTION     TONSILLECTOMY Left    cancer treated with chemo and radiation   TRIGGER FINGER RELEASE Right 05/19/2017   Procedure:  RELEASE TRIGGER FINGER/A-1 PULLEY ring finger;  Surgeon: Deeann Saint, MD;  Location: [facility];  Service: Orthopedics;  Laterality: Right;   VIDEO BRONCHOSCOPY WITH ENDOBRONCHIAL ULTRASOUND Bilateral 06/24/2022   Procedure: VIDEO BRONCHOSCOPY WITH ENDOBRONCHIAL ULTRASOUND;  Surgeon: [name], MD;  Location: [facility];  Service: Pulmonary;  Laterality: Bilateral;    FAMILY HISTORY :   Family History  Problem Relation Age of Onset   Osteoporosis Mother    Hyperlipidemia Mother    Depression Mother    Cancer Mother        skin cancer ? basal cell and lung ca heavy smoker   Anxiety disorder  Mother    COPD Mother    Drug abuse Mother    Miscarriages / [place] Mother    Varicose Veins Mother    Alcohol abuse Father    Cancer Father        skin CA ? basal cell   Heart disease Father        CHF   Hearing loss Father    Hyperlipidemia Brother    Hypertension Brother    Breast cancer Neg Hx     SOCIAL HISTORY:   Social History   Tobacco Use   Smoking status: Former    Current packs/day: 0.00    Average packs/day: 0.5 packs/day for 40.0 years (20.0 ttl pk-yrs)    Types: Cigarettes    Quit date: 1985    Years since quitting: 39.8   Smokeless tobacco: Never   Tobacco comments:    Not much of a smoker, peer pressure mainly  Vaping Use   Vaping status: Never Used  Substance Use Topics   Alcohol use: No   Drug use: No    ALLERGIES:  is allergic to amoxicillin, penicillin g, fluconazole, difuleron [ferrous fumarate-dss], and other.  MEDICATIONS:  Current Outpatient Medications  Medication Sig Dispense Refill   acetaminophen (TYLENOL) 325 MG tablet Take 2 tablets (650 mg total) by mouth every 6 (six) hours as needed for mild pain (or Fever >/= 101).     albuterol (PROVENTIL) (2.5 MG/3ML) 0.083% nebulizer solution Take 3 mLs (2.5 mg total) by nebulization in the morning and at bedtime. 180 mL 11   albuterol (VENTOLIN HFA) 108 (90 Base) MCG/ACT inhaler Inhale 2 puffs into the lungs every 6 (six) hours as needed for wheezing or shortness of breath. 8 g 3   ALPRAZolam (XANAX) 0.5 MG tablet Take 1 tablet (0.5 mg total) by mouth daily as needed. for anxiety 30 tablet 0   amLODipine (NORVASC) 5 MG tablet Take 5 mg by mouth daily.     atorvastatin (LIPITOR) 10 MG tablet TAKE ONE TABLET BY MOUTH ONCE DAILY 90 tablet 3   b complex vitamins tablet Take 1 tablet by mouth daily.     BIOTIN PO Take by mouth.     busPIRone (BUSPAR) 15 MG tablet Take 1.5 tabs po BID 270 tablet 1   Cholecalciferol (VITAMIN D-1000 MAX ST) 25 MCG (1000 UT) tablet Take 2,000 Units by mouth daily.  Takes three 50 mcg tablets, 3 capsules daily     Emollient (COLLAGEN EX)      famotidine (PEPCID) 20 MG tablet Take 1 tablet by mouth twice daily 180 tablet 1   glipiZIDE (GLUCOTROL) 5 MG tablet Take 2 tablets (10 mg total) by mouth daily before breakfast. (Patient taking differently: Take 10 mg by mouth 2 (two) times daily before a meal.)     glucose blood (ONETOUCH ULTRA) test strip USE TO check  blood glucose ONCE DAILY AND AS NEEDED FOR DIABETES 100 strip 1   losartan (COZAAR) 25 MG tablet Take 1 tablet by mouth once daily 90 tablet 0   magnesium oxide (MAG-OX) 400 (240 Mg) MG tablet Take 1 tablet (400 mg total) by mouth 2 (two) times daily. (Patient taking differently: Take 400 mg by mouth daily.) 60 tablet 1   metFORMIN (GLUCOPHAGE) 500 MG tablet Take 2,000 mg by mouth daily with breakfast.     mirtazapine (REMERON) 15 MG tablet Take 1 tablet (15 mg total) by mouth at bedtime. 30 tablet 1   Multiple Vitamins-Minerals (WOMENS MULTIVITAMIN PO) Take by mouth.     omeprazole (PRILOSEC) 20 MG capsule TAKE ONE CAPSULE BY MOUTH EVERYDAY AT BEDTIME 90 capsule 3   ondansetron (ZOFRAN) 4 MG tablet Take 1 tablet (4 mg total) by mouth daily as needed. 20 tablet 1   rOPINIRole (REQUIP) 1 MG tablet TAKE 1/2 TABLET BY MOUTH EVERY MORNING and TAKE THREE TABLETS BY MOUTH EVERYDAY AT BEDTIME 315 tablet 3   sertraline (ZOLOFT) 50 MG tablet Take 1/2 tab po qd x 2-4 days, then 1 tab po qd x 1 week, then 2 tabs po qd 60 tablet 1   Sodium Chloride (HYPERSAL) 3.5 % NEBU Take 1 each by nebulization 2 (two) times daily as needed. 240 mL 5   zinc gluconate 50 MG tablet Take 50 mg by mouth daily.     amitriptyline (ELAVIL) 50 MG tablet Take 50 mg by mouth at bedtime. (Patient not taking: Reported on 12/22/2022)     pregabalin (LYRICA) 150 MG capsule Take 300 mg by mouth at bedtime. (Patient not taking: Reported on 12/22/2022)     No current facility-administered medications for this visit.   Facility-Administered  Medications Ordered in Other Visits  Medication Dose Route Frequency Provider Last Rate Last Admin   influenza vaccine adjuvanted (FLUAD) injection 0.5 mL  0.5 mL Intramuscular Once Louretta Shorten R, MD       sodium chloride flush (NS) 0.9 % injection 10 mL  10 mL Intravenous PRN Earna Coder, MD   10 mL at 09/11/15 0845    PHYSICAL EXAMINATION: ECOG PERFORMANCE STATUS: 1 - Symptomatic but completely ambulatory  BP (!) 150/78 (BP Location: Left Arm, Patient Position: Sitting)   Pulse 87   Temp (!) 97 F (36.1 C) (Tympanic)   Wt 181 lb (82.1 kg)   SpO2 100%   BMI 29.21 kg/m   Filed Weights   12/29/22 1330  Weight: 181 lb (82.1 kg)   Physical Exam Constitutional:      Appearance: She is not ill-appearing.     Comments: Obese.  She is walking herself.  Alone.  HENT:     Head: Normocephalic and atraumatic.  Cardiovascular:     Rate and Rhythm: Normal rate and regular rhythm.  Pulmonary:     Effort: No respiratory distress.     Breath sounds: No wheezing.  Abdominal:     General: There is no distension.     Palpations: Abdomen is soft.     Tenderness: There is no abdominal tenderness. There is no guarding.  Musculoskeletal:        General: No tenderness. Normal range of motion.     Cervical back: Normal range of motion and neck supple.     Right lower leg: Edema present.     Left lower leg: Edema present.  Skin:    General: Skin is warm.     Coloration: Skin is  not pale.  Neurological:     Mental Status: She is alert and oriented to person, place, and time.  Psychiatric:        Mood and Affect: Affect normal.     Comments: Poor historian     LABORATORY DATA:  I have reviewed the data as listed Lab Results  Component Value Date   WBC 7.0 12/29/2022   NEUTROABS 5.2 12/29/2022   HGB 10.8 (L) 12/29/2022   HCT 35.1 (L) 12/29/2022   MCV 85.6 12/29/2022   PLT 273 12/29/2022      Chemistry      Component Value Date/Time   NA 140 12/29/2022 1320    NA 142 03/01/2014 0846   K 4.4 12/29/2022 1320   K 4.3 03/01/2014 0846   CL 107 12/29/2022 1320   CL 102 03/01/2014 0846   CO2 23 12/29/2022 1320   CO2 28 03/01/2014 0846   BUN 15 12/29/2022 1320   BUN 12 03/01/2014 0846   CREATININE 0.94 12/29/2022 1320   CREATININE 0.98 11/13/2022 1608      Component Value Date/Time   CALCIUM 8.6 (L) 12/29/2022 1320   CALCIUM 8.6 03/01/2014 0846   ALKPHOS 60 12/29/2022 1320   ALKPHOS 122 (H) 03/01/2014 0846   AST 21 12/29/2022 1320   ALT 11 12/29/2022 1320   ALT 28 03/01/2014 0846   BILITOT 0.3 12/29/2022 1320     Iron/TIBC/Ferritin/ %Sat    Component Value Date/Time   IRON 18 (L) 11/17/2022 1312   TIBC 389 11/17/2022 1312   FERRITIN 25 11/17/2022 1312   IRONPCTSAT 5 (L) 11/17/2022 1312   Lab Results  Component Value Date   MG 1.2 (L) 12/21/2022   MG 1.5 (L) 12/15/2022   MG 1.3 (L) 11/23/2022    RADIOGRAPHIC STUDIES: I have personally reviewed the radiological images as listed and agreed with the findings in the report. No results found.   ASSESSMENT & PLAN:   # Anemia- baseline hemoglobin 10-11 for past 10 years. Acutely worse. 7.4 today. Ferritin 25, iron sat 5%. Consistent with iron deficiency. Endorses black stools but now resolved. She is now s/p venofer x 3. Tolerated well. She has not been taking oral iron. Was referred to GI for possible colonoscopy, EGD, poss capsule and is scheduled next week for eval. She will receive venofer today and again next week then plan to recheck her labs in 1 month.   # Hypomagnesemia- Sep 2024- magnesium 1. Not currently taking magnesium. Today, 1.2. Encouraged compliance with oral magnesium.    # Diarrhea & weight loss- diagnosed with norovirus on 12/22/22 in ER after syncopal episode. Received IV fluids. Follow up with GI.   # Left Breast Cancer - diagnosed 2017. Stage III Triple negative. S/p neoadjuvant chemotherapy. On Surveillance. BIL March 2023. Follow up mammogram November 2024 was  reported as bi-rads category 2: benign.   # Anxiety/insomnia/depression/Forgetful- managed by behavioral health. Care has been coordinated with Corie Chiquito and Dr. Milinda Antis. Currently undergoing several medication changes. Appears more alert compared to previous. Follow up with psychiatry and pcp.   # Osteopenia- AUG 2021- T score -1.8. Reclast last received 10/2019. I've encouraged her to start oral calcium 1200 mg and vitamin d 1000 mg daily. Recommend repeating bone density which we can consider in the future. She's not on AI but did receive chemotherapy and we have been managing this for her in the past. Calcium still decreased. Recommend calcium and vitamin d.   # Bilateral lower extremity edema- anemia  may be contributing vs other etiologies. Referred to vascular.   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 Says amitriptyline and lyrica are on hold d/t possible interactions. Says symptoms are worse. Follow up with pcp and psych.     DISPOSITION: Venofer today and again either later this week or next 4 weeks- labs (cbc, cmp, ferritin, iron studies, hold tube), Dr Donneta Romberg, +/- venofer- la  No problem-specific Assessment & Plan notes found for this encounter.  No orders of the defined types were placed in this encounter.  All questions were answered. The patient knows to call the clinic with any problems, questions or concerns.    Suzanne Dooms, NP 12/29/2022

## 2022-12-30 ENCOUNTER — Telehealth: Payer: Self-pay | Admitting: Family Medicine

## 2022-12-30 ENCOUNTER — Other Ambulatory Visit: Payer: Self-pay | Admitting: Family Medicine

## 2022-12-30 ENCOUNTER — Encounter: Payer: Self-pay | Admitting: Psychiatry

## 2022-12-30 ENCOUNTER — Telehealth: Payer: Self-pay | Admitting: Psychiatry

## 2022-12-30 LAB — CANCER ANTIGEN 27.29: CA 27.29: 9.3 U/mL (ref 0.0–38.6)

## 2022-12-30 NOTE — Telephone Encounter (Signed)
Called patient to get her scheduled.I scheduled her for Next week Wednesday because she had other appointments on Monday and Tuesday. She would like to know in the mean time if she could be prescribed anything for her neuropathy? She said that she is in a lot of pain.

## 2022-12-30 NOTE — Telephone Encounter (Signed)
Patient notified

## 2022-12-30 NOTE — Telephone Encounter (Signed)
She has failed gabapentin and cymbalta in the past Had to stop lyrica due to over sedation/poly pharmacy/mental status changes  Also off amitriptyline for same reason  I reviewed her last neuro note from 2020    Where is her primary neuropathy symptom? (Feet/hands) ?  What is she still taking from psychiatry for mood? Thanks / please review her medicine list

## 2022-12-30 NOTE — Telephone Encounter (Signed)
Needs in person visit because she needs magnesium level -it does not look like they tested it Please encourage to take her magnesium Please schedule Thanks

## 2022-12-30 NOTE — Telephone Encounter (Signed)
Pt lvm that she is not sleeping well at all. She gets 3 hours a sleep sometimes.Some nights she doesn;t sleep at all. She said it is because her neropathy is so bad. She is in pain all the time. Please call her at [phone]

## 2022-12-30 NOTE — Telephone Encounter (Signed)
Yes, recommend stopping Mirtazapine since this has not been helpful for her sleep and following up with PCP about treatment options for neuropathy.

## 2022-12-30 NOTE — Telephone Encounter (Signed)
Patient called in stating  that Dr Milinda Antis would like her to have labs drawn,she said that she had some drawn at the cancer center yesterday 11/12 and was wondering if Dr Milinda Antis could use those labs? She also wanted to discuss a few of her medications interacting. 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 Will she need an OV? If so she wanted to know if virtual would be fine?

## 2022-12-30 NOTE — Telephone Encounter (Signed)
Patient reported she is not sleeping with mirtazapine 15 mg due to neuropathic pain. She said that Dr.Tower's nurse called her and she was told to stop the amitriptyline and Lyrica about "2 weeks to a month ago". Should she see PCP to address the neuropathy?

## 2022-12-31 ENCOUNTER — Other Ambulatory Visit: Payer: Self-pay | Admitting: Psychiatry

## 2022-12-31 DIAGNOSIS — F411 Generalized anxiety disorder: Secondary | ICD-10-CM

## 2022-12-31 NOTE — Addendum Note (Signed)
Addended by: Shon Millet on: 12/31/2022 03:56 PM   Modules accepted: Orders

## 2022-12-31 NOTE — Telephone Encounter (Signed)
I sent a message to her neurologist who saw her in 2020 to see if they have a safe recommendation  Waiting to hear  Will also route back to myself Thanks

## 2022-12-31 NOTE — Telephone Encounter (Signed)
Med list reviewed. Only med pt is taking is xanax prn, buspar and zoloft  Pt said the neuropathy is in hands, feet and legs

## 2023-01-01 NOTE — Telephone Encounter (Signed)
Pt notified of Dr. Royden Purl comments. Pt said she doesn't think she would want to go to a pain clinic but will talk with PCP directly at [facility] before making a decision

## 2023-01-01 NOTE — Telephone Encounter (Signed)
Dr Allena Katz did respond to my message  Noted that she has pretty much been on all the medications there are for neuropathy  She recommended considering a pain clinic/pain management program Would she be open to that ? We can discuss it at her follow up as well

## 2023-01-04 ENCOUNTER — Encounter: Payer: Self-pay | Admitting: Physician Assistant

## 2023-01-04 ENCOUNTER — Encounter: Payer: Self-pay | Admitting: Internal Medicine

## 2023-01-04 ENCOUNTER — Ambulatory Visit (INDEPENDENT_AMBULATORY_CARE_PROVIDER_SITE_OTHER): Payer: HMO | Admitting: Physician Assistant

## 2023-01-04 VITALS — BP 96/58 | HR 87 | Temp 97.9°F | Ht 66.0 in | Wt 179.2 lb

## 2023-01-04 DIAGNOSIS — K219 Gastro-esophageal reflux disease without esophagitis: Secondary | ICD-10-CM | POA: Diagnosis not present

## 2023-01-04 DIAGNOSIS — Z87891 Personal history of nicotine dependence: Secondary | ICD-10-CM

## 2023-01-04 DIAGNOSIS — D509 Iron deficiency anemia, unspecified: Secondary | ICD-10-CM

## 2023-01-04 DIAGNOSIS — E538 Deficiency of other specified B group vitamins: Secondary | ICD-10-CM | POA: Diagnosis not present

## 2023-01-04 DIAGNOSIS — Z853 Personal history of malignant neoplasm of breast: Secondary | ICD-10-CM | POA: Diagnosis not present

## 2023-01-04 DIAGNOSIS — Z85818 Personal history of malignant neoplasm of other sites of lip, oral cavity, and pharynx: Secondary | ICD-10-CM

## 2023-01-04 DIAGNOSIS — Z860101 Personal history of adenomatous and serrated colon polyps: Secondary | ICD-10-CM

## 2023-01-04 DIAGNOSIS — Z860100 Personal history of colon polyps, unspecified: Secondary | ICD-10-CM

## 2023-01-04 DIAGNOSIS — J690 Pneumonitis due to inhalation of food and vomit: Secondary | ICD-10-CM | POA: Diagnosis not present

## 2023-01-04 MED ORDER — NA SULFATE-K SULFATE-MG SULF 17.5-3.13-1.6 GM/177ML PO SOLN: 1.0000 | Freq: Once | ORAL | 0 refills | Status: AC

## 2023-01-04 MED ORDER — PEG 3350-KCL-NA BICARB-NACL 420 G PO SOLR: 4000.0000 mL | Freq: Once | ORAL | 0 refills | Status: AC

## 2023-01-04 NOTE — Progress Notes (Signed)
Celso Amy, PA-C [address]  [address]  [place], [place] [address]  Main: [phone]  Fax: [phone]   Gastroenterology Consultation  Referring Provider:     Judy Pimple, MD Primary Care Physician:  Tower, [name], MD Primary Gastroenterologist:  Celso Amy, PA-C / Dr. [name]   Reason for Consultation:     Iron deficiency anemia, B12 deficiency        HPI:   Suzanne Strong is a 77 y.o. y/o female referred for consultation & management  by Judy Pimple, MD.    12/29/2022 labs: Hemoglobin 10.8, MCV 85, BUN 15, CR 0.94, GFR > 60, normal electrolytes. 12/15/2022: Magnesium 1.5 (chronic low magnesium) 11/17/2022: Hemoglobin 7.4, iron 18, iron saturation 5%, ferritin 25  Medical history significant for left breast cancer diagnosed 2017 s/p chemo and surgery. 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  Followed by oncology.  Last saw hematology oncology 12/29/2022 for iron deficiency anemia.  Given IV iron.  History of diabetes and gastroparesis.  Had ED visit for acute nausea, vomiting, diarrhea 12/15/2022.  Diagnosed with norovirus.  12/24/2022: GI pathogen panel positive for norovirus.  All other infections negative.  C. difficile negative.  Diarrhea is improving.  Patient last saw Dr. Maximino Greenland in our office in 2022 for f/u of dysphagia.  Currently on omeprazole 20 Mg daily and famotidine 20 Mg daily with good control of GERD.  She has oropharyngeal dysphagia due to previous neck surgery from tonsil cancer.  She does not take any blood thinners.  She denies any current GI symptoms such as abdominal pain, heartburn, melena, hematochezia, or weight loss.  Recent Hemoccult test was negative.  05/2020 EGD: Normal esophagus, multiple benign gastric polyps, mild chronic gastritis, normal duodenum.  Biopsies negative for H. pylori and EOE.  05/2020 Colonoscopy: 7 mm tubular adenoma polyp removed from ascending colon.   Poor prep.  Repeat in 6 months with 2-day prep.   Past Medical History:  Diagnosis Date   Allergy 1999   Anemia    iron deficiency and B12 deficiency   Anxiety    Arthritis 2022   some in my knees   Breast cancer (HCC) 02/25/2015   upper-outer quadrant of left female breast, Triple Negative. Neo-adjuvant chemo with complete pathologic response.    Cataract Floating cataracts 2018   Cervical dysplasia    conization   Chronic kidney disease 2024   questionable   Diabetes mellitus without complication (HCC)    Diverticulosis    Dyspnea    Fever 04/11/2015   Gastroparesis    related to previous radiation tx   GERD (gastroesophageal reflux disease)    Hyperlipidemia    Hypertension    Neuromuscular disorder (HCC)    Neuropathy    legs/feet, S/P chemo meds   Personal history of chemotherapy    Personal history of radiation therapy 2017   LEFT lumpectomy w/ radiation   Pneumonia    RLS (restless legs syndrome)    Seizures (HCC) 2022   Shingles    Hx of   Sleep apnea    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HCC) 2006   Wears dentures    partial bottom    Past Surgical History:  Procedure Laterality Date   APPENDECTOMY     BREAST BIOPSY Left 02/25/2015   INVASIVE MAMMARY CARCINOMA,Triple negative.   BREAST CYST ASPIRATION     BREAST EXCISIONAL BIOPSY Left 12/2015   surgery   BREAST LUMPECTOMY  WITH NEEDLE LOCALIZATION Left 10/02/2015   Procedure: BREAST LUMPECTOMY WITH NEEDLE LOCALIZATION;  Surgeon: Earline Mayotte, MD;  Location: [facility];  Service: General;  Laterality: Left;   BREAST LUMPECTOMY WITH SENTINEL LYMPH NODE BIOPSY Left 10/02/2015   Procedure: LEFT BREAST WIDE EXCISION WITH SENTINEL LYMPH NODE BX;  Surgeon: Earline Mayotte, MD;  Location: [facility];  Service: General;  Laterality: Left;   BREAST SURGERY     breast biopsy benign   CARPAL TUNNEL RELEASE Right 05/19/2017   Procedure: CARPAL TUNNEL RELEASE;  Surgeon: Deeann Saint, MD;  Location: [facility];  Service:  Orthopedics;  Laterality: Right;   CATARACT EXTRACTION W/PHACO Right 10/26/2018   Procedure: CATARACT EXTRACTION PHACO AND INTRAOCULAR LENS PLACEMENT (IOC) right diabetic;  Surgeon: Lockie Mola, MD;  Location: [facility];  Service: Ophthalmology;  Laterality: Right;  Diabetic - oral meds cancer center been notified and will come   CATARACT EXTRACTION W/PHACO Left 11/16/2018   Procedure: CATARACT EXTRACTION PHACO AND INTRAOCULAR LENS PLACEMENT (IOC) LEFT DIABETIC  01:01.0  17.0%  10.37;  Surgeon: Lockie Mola, MD;  Location: [facility];  Service: Ophthalmology;  Laterality: Left;  Diabetic - oral meds Port-a-cath   CHOLECYSTECTOMY     Cologuard  07/22/2016   Negative   COLONOSCOPY WITH PROPOFOL N/A 05/24/2020   Procedure: COLONOSCOPY WITH PROPOFOL;  Surgeon: Pasty Spillers, MD;  Location: [facility];  Service: Endoscopy;  Laterality: N/A;   ESOPHAGOGASTRODUODENOSCOPY  07/2009   normal with few gastric polyps   ESOPHAGOGASTRODUODENOSCOPY (EGD) WITH PROPOFOL N/A 05/24/2020   Procedure: ESOPHAGOGASTRODUODENOSCOPY (EGD) WITH PROPOFOL;  Surgeon: Pasty Spillers, MD;  Location: [facility];  Service: Endoscopy;  Laterality: N/A;   EYE SURGERY  10/26/2018   FLEXIBLE BRONCHOSCOPY Bilateral 06/24/2022   Procedure: FLEXIBLE BRONCHOSCOPY;  Surgeon: [name], MD;  Location: [facility];  Service: Pulmonary;  Laterality: Bilateral;   FRACTURE SURGERY  1948 ish   broke my left arm as a child   MOLE REMOVAL     PORT-A-CATH REMOVAL     PORTACATH PLACEMENT     PORTACATH PLACEMENT Right 03/12/2015   Procedure: INSERTION PORT-A-CATH;  Surgeon: Earline Mayotte, MD;  Location: [facility];  Service: General;  Laterality: Right;   RADICAL NECK DISSECTION     TONSILLECTOMY Left    cancer treated with chemo and radiation   TRIGGER FINGER RELEASE Right 05/19/2017   Procedure: RELEASE TRIGGER FINGER/A-1 PULLEY ring finger;  Surgeon: Deeann Saint, MD;   Location: [facility];  Service: Orthopedics;  Laterality: Right;   VIDEO BRONCHOSCOPY WITH ENDOBRONCHIAL ULTRASOUND Bilateral 06/24/2022   Procedure: VIDEO BRONCHOSCOPY WITH ENDOBRONCHIAL ULTRASOUND;  Surgeon: [name], MD;  Location: [facility];  Service: Pulmonary;  Laterality: Bilateral;    Prior to Admission medications   Medication Sig Start Date End Date Taking? Authorizing Provider  acetaminophen (TYLENOL) 325 MG tablet Take 2 tablets (650 mg total) by mouth every 6 (six) hours as needed for mild pain (or Fever >/= 101). 05/30/20   Zannie Cove, MD  albuterol (PROVENTIL) (2.5 MG/3ML) 0.083% nebulizer solution Take 3 mLs (2.5 mg total) by nebulization in the morning and at bedtime. 05/26/22 05/26/23  [name], MD  albuterol (VENTOLIN HFA) 108 (90 Base) MCG/ACT inhaler Inhale 2 puffs into the lungs every 6 (six) hours as needed for wheezing or shortness of breath. 03/10/22   Tower, [name], MD  ALPRAZolam Prudy Feeler) 0.5 MG tablet Take 1 tablet by mouth twice daily as needed for anxiety 01/01/23   Montez Morita,  Shanda Bumps, PMHNP  amLODipine (NORVASC) 5 MG tablet Take 5 mg by mouth daily.    [provider]  atorvastatin (LIPITOR) 10 MG tablet TAKE ONE TABLET BY MOUTH ONCE DAILY 03/10/22   Tower, [name], MD  b complex vitamins tablet Take 1 tablet by mouth daily.    [provider]  BIOTIN PO Take by mouth.    [provider]  busPIRone (BUSPAR) 15 MG tablet Take 1.5 tabs po BID 11/17/22   Corie Chiquito, PMHNP  Cholecalciferol (VITAMIN D-1000 MAX ST) 25 MCG (1000 UT) tablet Take 2,000 Units by mouth daily. Takes three 50 mcg tablets, 3 capsules daily    [provider]  Emollient (COLLAGEN EX)     [provider]  famotidine (PEPCID) 20 MG tablet Take 1 tablet by mouth twice daily 12/11/22   Tower, [name], MD  glipiZIDE (GLUCOTROL) 5 MG tablet Take 2 tablets (10 mg total) by mouth daily before breakfast. Patient taking differently: Take 10 mg by mouth 2  (two) times daily before a meal. 08/18/21   Joaquim Nam, MD  glucose blood (ONETOUCH ULTRA) test strip USE TO check blood glucose ONCE DAILY AND AS NEEDED FOR DIABETES 12/03/20   Tower, [name], MD  losartan (COZAAR) 25 MG tablet Take 1 tablet by mouth once daily 12/02/22   Tower, [name], MD  magnesium oxide (MAG-OX) 400 (240 Mg) MG tablet Take 1 tablet (400 mg total) by mouth 2 (two) times daily. Patient taking differently: Take 400 mg by mouth daily. 11/16/22   Tower, [name], MD  metFORMIN (GLUCOPHAGE) 500 MG tablet Take 2,000 mg by mouth daily with breakfast.    [provider]  Multiple Vitamins-Minerals (WOMENS MULTIVITAMIN PO) Take by mouth.    [provider]  omeprazole (PRILOSEC) 20 MG capsule TAKE ONE CAPSULE BY MOUTH EVERYDAY AT BEDTIME 03/10/22   Tower, Idamae Schuller A, MD  ondansetron (ZOFRAN) 4 MG tablet Take 1 tablet (4 mg total) by mouth daily as needed. 12/21/22   Tower, [name], MD  rOPINIRole (REQUIP) 1 MG tablet TAKE 1/2 TABLET BY MOUTH EVERY MORNING and TAKE THREE TABLETS BY MOUTH EVERYDAY AT BEDTIME 03/10/22   Tower, [name], MD  sertraline (ZOLOFT) 50 MG tablet Take 1/2 tab po qd x 2-4 days, then 1 tab po qd x 1 week, then 2 tabs po qd 11/20/22   Corie Chiquito, PMHNP  Sodium Chloride (HYPERSAL) 3.5 % NEBU Take 1 each by nebulization 2 (two) times daily as needed. 07/31/22   Parrett, Virgel Bouquet, NP  zinc gluconate 50 MG tablet Take 50 mg by mouth daily.    [provider]    Family History  Problem Relation Age of Onset   Osteoporosis Mother    Hyperlipidemia Mother    Depression Mother    Cancer Mother        skin cancer ? basal cell and lung ca heavy smoker   Anxiety disorder Mother    COPD Mother    Drug abuse Mother    Miscarriages / [place] Mother    Varicose Veins Mother    Alcohol abuse Father    Cancer Father        skin CA ? basal cell   Heart disease Father        CHF   Hearing loss Father    Hyperlipidemia Brother    Hypertension  Brother    Breast cancer Neg Hx      Social History  Tobacco Use   Smoking status: Former    Current packs/day: 0.00    Average packs/day: 0.5 packs/day for 40.0 years (20.0 ttl pk-yrs)    Types: Cigarettes    Quit date: 16    Years since quitting: 39.9   Smokeless tobacco: Never   Tobacco comments:    Not much of a smoker, peer pressure mainly  Vaping Use   Vaping status: Never Used  Substance Use Topics   Alcohol use: No   Drug use: No    Allergies as of 01/04/2023 - Review Complete 01/04/2023  Allergen Reaction Noted   Amoxicillin Swelling 07/18/2007   Penicillin g Anaphylaxis 04/08/2020   Fluconazole Rash    Difuleron [ferrous fumarate-dss]  12/08/2017   Other Other (See Comments) 05/10/2017    Review of Systems:    All systems reviewed and negative except where noted in HPI.   Physical Exam:  BP (!) 96/58   Pulse 87   Temp 97.9 F (36.6 C)   Ht 5\' 6"  (1.676 m)   Wt 179 lb 3.2 oz (81.3 kg)   BMI 28.92 kg/m  No LMP recorded. Patient is postmenopausal.  General:   Alert,  Well-developed, well-nourished, pleasant and cooperative in NAD Lungs:  Respirations even and unlabored.  Clear throughout to auscultation.   No wheezes, crackles, or rhonchi. No acute distress. Heart:  Regular rate and rhythm; no murmurs, clicks, rubs, or gallops. Abdomen:  Normal bowel sounds.  No bruits.  Soft, and non-distended without masses, hepatosplenomegaly or hernias noted.  No Tenderness.  No guarding or rebound tenderness.    Neurologic:  Alert and oriented x3;  grossly normal neurologically. Psych:  Alert and cooperative. Normal mood and affect.  Imaging Studies: MM 3D SCREENING MAMMOGRAM BILATERAL BREAST  Result Date: 12/28/2022 CLINICAL DATA:  Screening. Status post left lumpectomy in 2017 EXAM: DIGITAL SCREENING BILATERAL MAMMOGRAM WITH TOMOSYNTHESIS AND CAD TECHNIQUE: Bilateral screening digital craniocaudal and mediolateral oblique mammograms were obtained. Bilateral  screening digital breast tomosynthesis was performed. The images were evaluated with computer-aided detection. COMPARISON:  Previous exam(s). ACR Breast Density Category b: There are scattered areas of fibroglandular density. FINDINGS: Stable postsurgical changes in the left breast. There are no findings suspicious for malignancy. IMPRESSION: No mammographic evidence of malignancy. A result letter of this screening mammogram will be mailed directly to the patient. RECOMMENDATION: Screening mammogram in one year. (Code:SM-B-01Y) BI-RADS CATEGORY  2: Benign. Electronically Signed   By: Jacob Moores M.D.   On: 12/28/2022 17:07   DG Chest Port 1 View  Result Date: 12/15/2022 CLINICAL DATA:  Questionable sepsis EXAM: PORTABLE CHEST 1 VIEW COMPARISON:  X-ray 07/21/2022 FINDINGS: Slight linear opacity left lung base likely scar or atelectasis. No consolidation, pneumothorax or effusion. Borderline cardiopericardial silhouette. Interstitial prominence again seen. Calcified aorta. Overlapping cardiac leads. IMPRESSION: No significant interval change. Chronic interstitial appearing changes. Electronically Signed   By: Karen Kays M.D.   On: 12/15/2022 13:02   CT HEAD WO CONTRAST  Result Date: 12/15/2022 CLINICAL DATA:  Dizziness and weakness, hypotension EXAM: CT HEAD WITHOUT CONTRAST TECHNIQUE: Contiguous axial images were obtained from the base of the skull through the vertex without intravenous contrast. RADIATION DOSE REDUCTION: This exam was performed according to the departmental dose-optimization program which includes automated exposure control, adjustment of the mA and/or kV according to patient size and/or use of iterative reconstruction technique. COMPARISON:  CT head 10/30/2021 FINDINGS: Brain: There is no acute intracranial hemorrhage, extra-axial fluid collection, or acute infarct. Parenchymal volume is  normal. The ventricles are normal in size. Gray-white differentiation is preserved. The  pituitary and suprasellar region are normal. There is no mass lesion. There is no mass effect or midline shift. Vascular: No hyperdense vessel or unexpected calcification. Skull: Normal. Negative for fracture or focal lesion. Sinuses/Orbits: The patient is status post functional endoscopic sinus surgery. Bilateral lens implants are in place. The globes and orbits are otherwise unremarkable. Other: The mastoid air cells and middle ear cavities are clear. IMPRESSION: No acute intracranial pathology. Electronically Signed   By: Lesia Hausen M.D.   On: 12/15/2022 12:16    Assessment and Plan:   MONTIE ABBASI is a 77 y.o. y/o female has been referred for:  1.  Chronic iron deficiency anemia  Labs: CBC, iron panel, ferritin, Celiac panal  Scheduling EGD & Colonoscopy I discussed risks of EGD & Colonoscopy with patient to include risk of bleeding, perforation, and risk of sedation.  Patient expressed understanding and agrees to proceed with EGD.   **If EGD and colonoscopy are unrevealing, then can schedule capsule endoscopy.  2.  B12 deficiency and hypomagnesemia  Lab: B12, Magnesium  3.  GERD - Controlled   Continue omeprazole and famotidine  4.  Recent norovirus - Improved  Symptomatic treatment  5.  History of adenomatous colon polyp  Scheduling repeat colonoscopy with 2-day prep  Scheduling Colonoscopy I discussed risks of colonoscopy with patient to include risk of bleeding, colon perforation, and risk of sedation.  Patient expressed understanding and agrees to proceed with colonoscopy.   6.  History of breast and tonsil cancer, currently in remission not on treatment at present.  Followed by oncology  Follow up as needed based on above test results.  Celso Amy, PA-C

## 2023-01-05 ENCOUNTER — Inpatient Hospital Stay: Payer: HMO

## 2023-01-05 ENCOUNTER — Ambulatory Visit: Payer: HMO | Admitting: Family Medicine

## 2023-01-05 VITALS — BP 118/57 | HR 81 | Temp 96.0°F | Resp 17

## 2023-01-05 DIAGNOSIS — C50412 Malignant neoplasm of upper-outer quadrant of left female breast: Secondary | ICD-10-CM

## 2023-01-05 DIAGNOSIS — D649 Anemia, unspecified: Secondary | ICD-10-CM | POA: Diagnosis not present

## 2023-01-05 DIAGNOSIS — M899 Disorder of bone, unspecified: Secondary | ICD-10-CM

## 2023-01-05 MED ORDER — IRON SUCROSE 20 MG/ML IV SOLN
200.0000 mg | Freq: Once | INTRAVENOUS | Status: AC
  Administered 2023-01-05: 200 mg via INTRAVENOUS
  Filled 2023-01-05: qty 10

## 2023-01-05 MED ORDER — SODIUM CHLORIDE 0.9% FLUSH
10.0000 mL | Freq: Once | INTRAVENOUS | Status: AC | PRN
  Administered 2023-01-05: 10 mL
  Filled 2023-01-05: qty 10

## 2023-01-05 NOTE — Progress Notes (Signed)
Patient tolerated Venofer infusion well. Explained recommendation of 30 min post monitoring. Patient refused to wait post monitoring. Educated on what signs to watch for & to call with any concerns. No questions, discharged. Stable  

## 2023-01-05 NOTE — Patient Instructions (Signed)
Iron Sucrose Injection What is this medication? IRON SUCROSE (EYE ern SOO krose) treats low levels of iron (iron deficiency anemia) in people with kidney disease. Iron is a mineral that plays an important role in making red blood cells, which carry oxygen from your lungs to the rest of your body. This medicine may be used for other purposes; ask your health care provider or pharmacist if you have questions. COMMON BRAND NAME(S): Venofer What should I tell my care team before I take this medication? They need to know if you have any of these conditions: Anemia not caused by low iron levels Heart disease High levels of iron in the blood Kidney disease Liver disease An unusual or allergic reaction to iron, other medications, foods, dyes, or preservatives Pregnant or trying to get pregnant Breastfeeding How should I use this medication? This medication is for infusion into a vein. It is given in a hospital or clinic setting. Talk to your care team about the use of this medication in children. While this medication may be prescribed for children as young as 2 years for selected conditions, precautions do apply. Overdosage: If you think you have taken too much of this medicine contact a poison control center or emergency room at once. NOTE: This medicine is only for you. Do not share this medicine with others. What if I miss a dose? Keep appointments for follow-up doses. It is important not to miss your dose. Call your care team if you are unable to keep an appointment. What may interact with this medication? Do not take this medication with any of the following: Deferoxamine Dimercaprol Other iron products This medication may also interact with the following: Chloramphenicol Deferasirox This list may not describe all possible interactions. Give your health care provider a list of all the medicines, herbs, non-prescription drugs, or dietary supplements you use. Also tell them if you smoke,  drink alcohol, or use illegal drugs. Some items may interact with your medicine. What should I watch for while using this medication? Visit your care team regularly. Tell your care team if your symptoms do not start to get better or if they get worse. You may need blood work done while you are taking this medication. You may need to follow a special diet. Talk to your care team. Foods that contain iron include: whole grains/cereals, dried fruits, beans, or peas, leafy green vegetables, and organ meats (liver, kidney). What side effects may I notice from receiving this medication? Side effects that you should report to your care team as soon as possible: Allergic reactions--skin rash, itching, hives, swelling of the face, lips, tongue, or throat Low blood pressure--dizziness, feeling faint or lightheaded, blurry vision Shortness of breath Side effects that usually do not require medical attention (report to your care team if they continue or are bothersome): Flushing Headache Joint pain Muscle pain Nausea Pain, redness, or irritation at injection site This list may not describe all possible side effects. Call your doctor for medical advice about side effects. You may report side effects to FDA at 1-800-FDA-1088. Where should I keep my medication? This medication is given in a hospital or clinic. It will not be stored at home. NOTE: This sheet is a summary. It may not cover all possible information. If you have questions about this medicine, talk to your doctor, pharmacist, or health care provider.  2024 Elsevier/Gold Standard (2022-07-10 00:00:00)

## 2023-01-06 ENCOUNTER — Ambulatory Visit: Payer: HMO | Admitting: Family Medicine

## 2023-01-06 VITALS — BP 133/66 | HR 90 | Temp 98.5°F | Ht 66.0 in | Wt 180.2 lb

## 2023-01-06 DIAGNOSIS — R197 Diarrhea, unspecified: Secondary | ICD-10-CM | POA: Diagnosis not present

## 2023-01-06 DIAGNOSIS — D509 Iron deficiency anemia, unspecified: Secondary | ICD-10-CM

## 2023-01-06 DIAGNOSIS — E538 Deficiency of other specified B group vitamins: Secondary | ICD-10-CM

## 2023-01-06 DIAGNOSIS — E1142 Type 2 diabetes mellitus with diabetic polyneuropathy: Secondary | ICD-10-CM

## 2023-01-06 DIAGNOSIS — C801 Malignant (primary) neoplasm, unspecified: Secondary | ICD-10-CM

## 2023-01-06 DIAGNOSIS — I10 Essential (primary) hypertension: Secondary | ICD-10-CM

## 2023-01-06 DIAGNOSIS — F418 Other specified anxiety disorders: Secondary | ICD-10-CM

## 2023-01-06 DIAGNOSIS — M179 Osteoarthritis of knee, unspecified: Secondary | ICD-10-CM | POA: Insufficient documentation

## 2023-01-06 DIAGNOSIS — Z7984 Long term (current) use of oral hypoglycemic drugs: Secondary | ICD-10-CM | POA: Diagnosis not present

## 2023-01-06 DIAGNOSIS — M170 Bilateral primary osteoarthritis of knee: Secondary | ICD-10-CM

## 2023-01-06 LAB — CELIAC DISEASE AB SCREEN W/RFX
Antigliadin Abs, IgA: 25 U — ABNORMAL HIGH (ref 0–19)
IgA/Immunoglobulin A, Serum: 187 mg/dL (ref 64–422)
Transglutaminase IgA: <2 U/mL (ref 0–3)

## 2023-01-06 LAB — CBC WITH DIFFERENTIAL/PLATELET
Basophils Absolute: 0 10*3/uL (ref 0.0–0.2)
Basos: 1 %
EOS (ABSOLUTE): 0.2 10*3/uL (ref 0.0–0.4)
Eos: 3 %
Hematocrit: 38.2 % (ref 34.0–46.6)
Hemoglobin: 11.7 g/dL (ref 11.1–15.9)
Immature Grans (Abs): 0 10*3/uL (ref 0.0–0.1)
Immature Granulocytes: 0 %
Lymphocytes Absolute: 1.3 10*3/uL (ref 0.7–3.1)
Lymphs: 19 %
MCH: 26.5 pg — ABNORMAL LOW (ref 26.6–33.0)
MCHC: 30.6 g/dL — ABNORMAL LOW (ref 31.5–35.7)
MCV: 86 fL (ref 79–97)
Monocytes Absolute: 0.5 10*3/uL (ref 0.1–0.9)
Monocytes: 7 %
Neutrophils Absolute: 4.8 10*3/uL (ref 1.4–7.0)
Neutrophils: 70 %
Platelets: 352 10*3/uL (ref 150–450)
RBC: 4.42 x10E6/uL (ref 3.77–5.28)
RDW: 19.8 % — ABNORMAL HIGH (ref 11.7–15.4)
WBC: 6.9 10*3/uL (ref 3.4–10.8)

## 2023-01-06 LAB — IRON,TIBC AND FERRITIN PANEL
Ferritin: 313 ng/mL — ABNORMAL HIGH (ref 15–150)
Iron Saturation: 24 % (ref 15–55)
Iron: 80 ug/dL (ref 27–139)
Total Iron Binding Capacity: 340 ug/dL (ref 250–450)
UIBC: 260 ug/dL (ref 118–369)

## 2023-01-06 LAB — MAGNESIUM: Magnesium: 1.4 mg/dL — ABNORMAL LOW (ref 1.6–2.3)

## 2023-01-06 LAB — VITAMIN B12: Vitamin B-12: 310 pg/mL (ref 232–1245)

## 2023-01-06 MED ORDER — LOSARTAN POTASSIUM 25 MG PO TABS: 25.0000 mg | ORAL_TABLET | Freq: Every day | ORAL | 2 refills | Status: DC

## 2023-01-06 MED ORDER — AMITRIPTYLINE HCL 10 MG PO TABS: 10.0000 mg | ORAL_TABLET | Freq: Every day | ORAL | 1 refills | Status: DC

## 2023-01-06 NOTE — Assessment & Plan Note (Signed)
Bilateral Goes to [facility]  Cortisone shots every 3 mo usually  Wants to avoid knee replacement

## 2023-01-06 NOTE — Assessment & Plan Note (Signed)
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

## 2023-01-06 NOTE — Assessment & Plan Note (Signed)
Last level 1.4 on 11/18 Improving on mag ox  Worsened by chronic diarrhea   Encouraged her to cut mag ox 400 into halves and spread out dosage to prevent side effects  Has seen pharmacist and discussed this

## 2023-01-06 NOTE — Assessment & Plan Note (Signed)
Ongoing psychiatric care  Per pt almost all anxiety  Had grief after loss of husband also  Taking Zoloft 100 mg daily  Buspar 15 mg (1.5 tabs bid)  Xanax 0.5 mg bid prn -has not taken any in a while / not often   After reduction of sedating medicines- is much more cognitively sharp   Consider re introduction of low dose amitriptyline for neuropathy with close monitor

## 2023-01-06 NOTE — Assessment & Plan Note (Signed)
Ongoing but improved Had noro virus recently  Eats yogurt for probiotic Metformin may worsen/ pt will d/w endocrinology  Magnesium may worsen but has to take with low level (ironically worsened by diarrhea)  Colonoscopy upcoming Encouraged pt to discuss with her GI specialist further

## 2023-01-06 NOTE — Patient Instructions (Addendum)
Make an appointment with your endocrinologist  Discuss other options to metformin due to diarrhea  You can try holding metformin for 1-2 days just to see if diarrhea   You can try splitting the magnesium pills and taking it more often to reduce diarrhea as well    Tylenol is ok for knee pain  Voltaren gel 1% is ok over the counter also   Let's try a low dose of amitriptyline again for the neuropathy  Take it at bedtime 10 mg  We will monitor your mental status closely  If this makes you sedated / foggy or confused let us know or any other side effects   I refilled losartan   Keep [place] posted

## 2023-01-06 NOTE — Assessment & Plan Note (Signed)
Lab Results  Component Value Date   VITAMINB12 310 01/04/2023   Oral supplementation

## 2023-01-06 NOTE — Assessment & Plan Note (Signed)
Improved with iron infusions Planning colonoscopy and egd  Lab Results  Component Value Date   WBC 6.9 01/04/2023   HGB 11.7 01/04/2023   HCT 38.2 01/04/2023   MCV 86 01/04/2023   PLT 352 01/04/2023   Lab Results  Component Value Date   IRON 80 01/04/2023   TIBC 340 01/04/2023   FERRITIN 313 (H) 01/04/2023

## 2023-01-06 NOTE — Progress Notes (Signed)
Subjective:    Patient ID: Suzanne Strong, female    DOB: 1945/10/28, 77 y.o.   MRN: [id number]  HPI  Wt Readings from Last 3 Encounters:  01/06/23 180 lb 4 oz (81.8 kg)  01/04/23 179 lb 3.2 oz (81.3 kg)  12/29/22 181 lb (82.1 kg)   29.09 kg/m  Vitals:   01/06/23 1046 01/06/23 1137  BP: (!) 142/70 133/66  Pulse: 90   Temp: 98.5 F (36.9 C)   SpO2: 96%     Pt presents for follow up of diarrhea /noro virus , hypomagnesemia , neuropathy , anemia    Diarrhea  Better than it was but not gone  Tested positive for noro virus 12/22/22  (other tests neg incl c diff)  Takes mag Takes metformin   Worse in afternoon and night  Having 4-6 bm per day  By afternoon is watery  She did discuss this with GI doctor / did not say much  Eats yogurt for probiotics  Takes all meds with it  Drinking protein drinks   Nausea is chronic but not severe  Gets nausea after iron treatments   Is prone to low grade temp at night  Years and years  ? If related to bronchiectasis     Iron def anemia -mod to severe  Lab Results  Component Value Date   WBC 6.9 01/04/2023   HGB 11.7 01/04/2023   HCT 38.2 01/04/2023   MCV 86 01/04/2023   PLT 352 01/04/2023   Seen hematology-has had iron infusions  Had visit with GI to determine cause  EGD and colonoscopy scheduled (with 2 d prep) -in mid December  Celiac panel ordered (Iga was elevated at 25, acc to GI not high enough for celiac)  GERD is controlled with omeprazole and famotidine    Lab Results  Component Value Date   IRON 80 01/04/2023   TIBC 340 01/04/2023   FERRITIN 313 (H) 01/04/2023   Lab Results  Component Value Date   VITAMINB12 310 01/04/2023     Low magnesium Level of 1.2 early this month On 11/18 1.4 (normal is above 1.6)  She is taking one pill twice daily- she never cut it into pc / just taking a whole pill daily  Eats with it   Lab Results  Component Value Date   NA 140 12/29/2022   K 4.4 12/29/2022    CO2 23 12/29/2022   GLUCOSE 100 (H) 12/29/2022   BUN 15 12/29/2022   CREATININE 0.94 12/29/2022   CALCIUM 8.6 (L) 12/29/2022   GFR 41.50 (L) 12/21/2022   GFRNONAA >60 12/29/2022       Some issues with tolerability of magnesium  Discussed with pharmacy- other forms of mag with less diarrhea potential to not absorb as well Decided to divide her mag ox into smaller amounts and take more frequently   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  Tips of fingers stay numb -difficult to button clothes/etc  This can move up to wrists by night time  Feet are numb as well Tingling pain but not stabbing pain  Sometimes back of legs   Epsom salt bath helps     In past amitriptyline Lyrica  These were held due to possible impact on MS and interactions     Failed gabapentin and cymbalta in the past   Takes requip for RLS  HTN bp is stable today  No cp or palpitations or headaches or edema  No side effects to medicines  BP  Readings from Last 3 Encounters:  01/06/23 133/66  01/05/23 (!) 118/57  01/04/23 (!) 96/58    Amlodipine 5 mg daily  Losartan 25 mg daily   Mood (GAD and panic disorder per pt) Grief -in the past after loss of husband of 54 years  Psychiatry  Zoloft 100 mg daily  Buspar 15 mg (1.5 tabs bid)  Xanax 0.5 mg bid prn -has not taken any in a while / not often   Does feel more alert off the other medicines   Is interested in trying amitriptyline again at a small dose    DM2 Endocrinology  On metformin  Ozempic worked well - but could not afford it -  Rebelysus - caused even worse n/v   Due for an appointment  Last A1c was in the 6 range   Knee pain  [facility]  Had some cortisone shots in knees (every 3 months) She does not want knee replacement  Alpha lipoic acid was helpful in past as well -may go back to it    Lab Results  Component Value Date   ALT 11 12/29/2022   AST 21 12/29/2022   ALKPHOS 60 12/29/2022   BILITOT 0.3 12/29/2022       Patient Active Problem List   Diagnosis Date Noted   OA (osteoarthritis) of knee 01/06/2023   Type 2 diabetes mellitus with diabetic polyneuropathy, unspecified whether long term insulin use (HCC) 12/21/2022   Change in mental status 11/10/2022   Bronchiectasis without complication (HCC) 07/31/2022   Diarrhea 07/22/2022   Lymphadenopathy 06/24/2022   Poor balance 04/28/2022   Vision changes 02/12/2022   Polypharmacy 02/12/2022   At high risk for aspiration 04/23/2021   Recurrent aspiration pneumonia (HCC) 12/09/2020   Dysuria 07/17/2020   Dysphagia    Gastric erythema    Gastric polyp    Encounter for screening colonoscopy    Polyp of colon    History of aspiration pneumonia 02/15/2020   Diabetes mellitus without complication (HCC)    GERD (gastroesophageal reflux disease)    Hypertension    Urinary urgency 01/05/2020   Grief reaction 09/18/2019   Swallowing dysfunction 07/18/2019   Fever, intermittent 09/01/2018   Pre-syncope 02/07/2018   Episodic weakness 01/26/2018   Medicare annual wellness visit, subsequent 06/29/2017   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   Preoperative examination 04/27/2017   Carpal tunnel syndrome of right wrist 11/13/2016   Obstructive sleep apnea 01/01/2016   Carcinoma of upper-outer quadrant of left breast in female, estrogen receptor negative (HCC) 11/21/2015   Hypomagnesemia 08/28/2015   Initial Medicare annual wellness visit 10/16/2014   Estrogen deficiency 10/16/2014   Colon cancer screening 10/16/2014   Controlled type 2 diabetes mellitus without complication (HCC) 03/28/2014   RLS (restless legs syndrome) 02/21/2014   Chronic neck pain 07/25/2010   Depression with anxiety 07/07/2010   Routine general medical examination at a health care facility 06/26/2010   B12 deficiency 09/04/2009   Anemia, iron deficiency 08/29/2009   Gastroparesis 08/26/2009   Vitamin D deficiency 07/19/2008   Disorder of bone and  cartilage 07/19/2007   Hyperlipidemia 07/18/2007   Edema 07/18/2007   Past Medical History:  Diagnosis Date   Allergy 1999   Anemia    iron deficiency and B12 deficiency   Anxiety    Arthritis 2022   some in my knees   Breast cancer (HCC) 02/25/2015   upper-outer quadrant of left female breast, Triple Negative. Neo-adjuvant chemo with complete pathologic response.  Cataract Floating cataracts 2018   Cervical dysplasia    conization   Chronic kidney disease 2024   questionable   Diabetes mellitus without complication (HCC)    Diverticulosis    Dyspnea    Fever 04/11/2015   Gastroparesis    related to previous radiation tx   GERD (gastroesophageal reflux disease)    Hyperlipidemia    Hypertension    Neuromuscular disorder (HCC)    Neuropathy    legs/feet, S/P chemo meds   Personal history of chemotherapy    Personal history of radiation therapy 2017   LEFT lumpectomy w/ radiation   Pneumonia    RLS (restless legs syndrome)    Seizures (HCC) 2022   Shingles    Hx of   Sleep apnea    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HCC) 2006   Wears dentures    partial bottom   Past Surgical History:  Procedure Laterality Date   APPENDECTOMY     BREAST BIOPSY Left 02/25/2015   INVASIVE MAMMARY CARCINOMA,Triple negative.   BREAST CYST ASPIRATION     BREAST EXCISIONAL BIOPSY Left 12/2015   surgery   BREAST LUMPECTOMY WITH NEEDLE LOCALIZATION Left 10/02/2015   Procedure: BREAST LUMPECTOMY WITH NEEDLE LOCALIZATION;  Surgeon: Earline Mayotte, MD;  Location: [facility];  Service: General;  Laterality: Left;   BREAST LUMPECTOMY WITH SENTINEL LYMPH NODE BIOPSY Left 10/02/2015   Procedure: LEFT BREAST WIDE EXCISION WITH SENTINEL LYMPH NODE BX;  Surgeon: Earline Mayotte, MD;  Location: [facility];  Service: General;  Laterality: Left;   BREAST SURGERY     breast biopsy benign   CARPAL TUNNEL RELEASE Right 05/19/2017   Procedure: CARPAL TUNNEL RELEASE;  Surgeon: Deeann Saint, MD;  Location: [facility]  [facility];  Service: Orthopedics;  Laterality: Right;   CATARACT EXTRACTION W/PHACO Right 10/26/2018   Procedure: CATARACT EXTRACTION PHACO AND INTRAOCULAR LENS PLACEMENT (IOC) right diabetic;  Surgeon: Lockie Mola, MD;  Location: [facility];  Service: Ophthalmology;  Laterality: Right;  Diabetic - oral meds cancer center been notified and will come   CATARACT EXTRACTION W/PHACO Left 11/16/2018   Procedure: CATARACT EXTRACTION PHACO AND INTRAOCULAR LENS PLACEMENT (IOC) LEFT DIABETIC  01:01.0  17.0%  10.37;  Surgeon: Lockie Mola, MD;  Location: [facility];  Service: Ophthalmology;  Laterality: Left;  Diabetic - oral meds Port-a-cath   CHOLECYSTECTOMY     Cologuard  07/22/2016   Negative   COLONOSCOPY WITH PROPOFOL N/A 05/24/2020   Procedure: COLONOSCOPY WITH PROPOFOL;  Surgeon: Pasty Spillers, MD;  Location: [facility];  Service: Endoscopy;  Laterality: N/A;   ESOPHAGOGASTRODUODENOSCOPY  07/2009   normal with few gastric polyps   ESOPHAGOGASTRODUODENOSCOPY (EGD) WITH PROPOFOL N/A 05/24/2020   Procedure: ESOPHAGOGASTRODUODENOSCOPY (EGD) WITH PROPOFOL;  Surgeon: Pasty Spillers, MD;  Location: [facility];  Service: Endoscopy;  Laterality: N/A;   EYE SURGERY  10/26/2018   FLEXIBLE BRONCHOSCOPY Bilateral 06/24/2022   Procedure: FLEXIBLE BRONCHOSCOPY;  Surgeon: [name], MD;  Location: [facility];  Service: Pulmonary;  Laterality: Bilateral;   FRACTURE SURGERY  1948 ish   broke my left arm as a child   MOLE REMOVAL     PORT-A-CATH REMOVAL     PORTACATH PLACEMENT     PORTACATH PLACEMENT Right 03/12/2015   Procedure: INSERTION PORT-A-CATH;  Surgeon: Earline Mayotte, MD;  Location: [facility];  Service: General;  Laterality: Right;   RADICAL NECK DISSECTION     TONSILLECTOMY Left    cancer treated with chemo and radiation   TRIGGER  FINGER RELEASE Right 05/19/2017   Procedure: RELEASE TRIGGER FINGER/A-1 PULLEY ring finger;  Surgeon: Deeann Saint, MD;  Location: [facility];  Service: Orthopedics;  Laterality: Right;   VIDEO BRONCHOSCOPY WITH ENDOBRONCHIAL ULTRASOUND Bilateral 06/24/2022   Procedure: VIDEO BRONCHOSCOPY WITH ENDOBRONCHIAL ULTRASOUND;  Surgeon: [name], MD;  Location: [facility];  Service: Pulmonary;  Laterality: Bilateral;   Social History   Tobacco Use   Smoking status: Former    Current packs/day: 0.00    Average packs/day: 0.5 packs/day for 40.0 years (20.0 ttl pk-yrs)    Types: Cigarettes    Quit date: 49    Years since quitting: 39.9   Smokeless tobacco: Never   Tobacco comments:    Not much of a smoker, peer pressure mainly  Vaping Use   Vaping status: Never Used  Substance Use Topics   Alcohol use: No   Drug use: No   Family History  Problem Relation Age of Onset   Osteoporosis Mother    Hyperlipidemia Mother    Depression Mother    Cancer Mother        skin cancer ? basal cell and lung ca heavy smoker   Anxiety disorder Mother    COPD Mother    Drug abuse Mother    Miscarriages / [place] Mother    Varicose Veins Mother    Alcohol abuse Father    Cancer Father        skin CA ? basal cell   Heart disease Father        CHF   Hearing loss Father    Hyperlipidemia Brother    Hypertension Brother    Breast cancer Neg Hx    Allergies  Allergen Reactions   Amoxicillin Swelling    REACTION: rash, swelling Has patient had a PCN reaction causing immediate rash, facial/tongue/throat swelling, SOB or lightheadedness with hypotension: yes Has patient had a PCN reaction causing severe rash involving mucus membranes or skin necrosis: no Has patient had a PCN reaction that required hospitalization: no Has patient had a PCN reaction occurring within the last 10 years: yes If all of the above answers are "NO", then may proceed with Cephalosporin use.  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     albuterol (PROVENTIL) (2.5 MG/3ML) 0.083% nebulizer solution Take 3 mLs (2.5 mg total) by nebulization in the morning and at bedtime. 180 mL 11   albuterol (VENTOLIN HFA) 108 (90 Base) MCG/ACT inhaler Inhale 2 puffs into the lungs every 6 (six) hours as needed for wheezing or shortness of breath. 8 g 3   ALPRAZolam (XANAX) 0.5 MG tablet Take 1 tablet by mouth twice daily as needed for anxiety 60 tablet 2   amLODipine (NORVASC) 5 MG tablet Take 5 mg by mouth daily.     atorvastatin (LIPITOR) 10 MG tablet TAKE ONE TABLET BY MOUTH ONCE DAILY 90 tablet 3   b complex vitamins tablet Take 1 tablet by mouth daily.  BIOTIN PO Take by mouth.     busPIRone (BUSPAR) 15 MG tablet Take 1.5 tabs po BID 270 tablet 1   Cholecalciferol (VITAMIN D-1000 MAX ST) 25 MCG (1000 UT) tablet Take 2,000 Units by mouth daily. Takes three 50 mcg tablets, 3 capsules daily     Emollient (COLLAGEN EX)      famotidine (PEPCID) 20 MG tablet Take 1 tablet by mouth twice daily 180 tablet 1   glipiZIDE (GLUCOTROL) 5 MG tablet Take 2 tablets (10 mg total) by mouth daily before breakfast. (Patient taking differently: Take 10 mg by mouth 2 (two) times daily before a meal.)     glucose blood (ONETOUCH ULTRA) test strip USE TO check blood glucose ONCE DAILY AND AS NEEDED FOR DIABETES 100 strip 1   magnesium oxide (MAG-OX) 400 (240 Mg) MG tablet Take 1 tablet (400 mg total) by mouth 2 (two) times daily. (Patient taking differently: Take 400 mg by mouth daily.) 60 tablet 1   metFORMIN (GLUCOPHAGE) 500 MG tablet Take 2,000 mg by mouth daily with  breakfast.     Multiple Vitamins-Minerals (WOMENS MULTIVITAMIN PO) Take by mouth.     omeprazole (PRILOSEC) 20 MG capsule TAKE ONE CAPSULE BY MOUTH EVERYDAY AT BEDTIME 90 capsule 3   ondansetron (ZOFRAN) 4 MG tablet Take 1 tablet (4 mg total) by mouth daily as needed. 20 tablet 1   rOPINIRole (REQUIP) 1 MG tablet TAKE 1/2 TABLET BY MOUTH EVERY MORNING and TAKE THREE TABLETS BY MOUTH EVERYDAY AT BEDTIME 315 tablet 3   sertraline (ZOLOFT) 50 MG tablet Take 1/2 tab po qd x 2-4 days, then 1 tab po qd x 1 week, then 2 tabs po qd 60 tablet 1   Sodium Chloride (HYPERSAL) 3.5 % NEBU Take 1 each by nebulization 2 (two) times daily as needed. 240 mL 5   zinc gluconate 50 MG tablet Take 50 mg by mouth daily.     No current facility-administered medications on file prior to visit.    Review of Systems  Constitutional:  Positive for fatigue. Negative for activity change, appetite change, fever and unexpected weight change.  HENT:  Positive for trouble swallowing. Negative for congestion, ear pain, rhinorrhea, sinus pressure and sore throat.   Eyes:  Negative for pain, redness and visual disturbance.  Respiratory:  Positive for cough. Negative for shortness of breath and wheezing.   Cardiovascular:  Negative for chest pain and palpitations.  Gastrointestinal:  Negative for abdominal pain, blood in stool, constipation and diarrhea.  Endocrine: Negative for polydipsia and polyuria.  Genitourinary:  Negative for dysuria, frequency and urgency.  Musculoskeletal:  Positive for arthralgias and back pain. Negative for myalgias.  Skin:  Negative for pallor and rash.  Allergic/Immunologic: Negative for environmental allergies.  Neurological:  Positive for numbness. Negative for dizziness, syncope and headaches.       Chronic neuropathy symptoms in hands and feet  Tingling and pain   Hematological:  Negative for adenopathy. Does not bruise/bleed easily.  Psychiatric/Behavioral:  Negative for decreased  concentration and dysphoric mood. The patient is nervous/anxious.        Objective:   Physical Exam Constitutional:      General: She is not in acute distress.    Appearance: Normal appearance. She is well-developed. She is not ill-appearing or diaphoretic.     Comments: Overweight   HENT:     Head: Normocephalic and atraumatic.     Mouth/Throat:     Mouth: Mucous membranes are moist.  Comments: Baseline surgical changes  Eyes:     General: No scleral icterus.    Conjunctiva/sclera: Conjunctivae normal.     Pupils: Pupils are equal, round, and reactive to light.  Neck:     Thyroid: No thyromegaly.     Vascular: No carotid bruit or JVD.  Cardiovascular:     Rate and Rhythm: Normal rate and regular rhythm.     Heart sounds: Normal heart sounds.     No gallop.  Pulmonary:     Effort: Pulmonary effort is normal. No respiratory distress.     Breath sounds: Normal breath sounds. No wheezing or rales.  Abdominal:     General: There is no distension or abdominal bruit.     Palpations: Abdomen is soft.  Musculoskeletal:     Cervical back: Normal range of motion and neck supple.     Right lower leg: No edema.     Left lower leg: No edema.     Comments: Kyphosis   Lymphadenopathy:     Cervical: No cervical adenopathy.  Skin:    General: Skin is warm and dry.     Coloration: Skin is not pale.     Findings: No rash.  Neurological:     Mental Status: She is alert.     Cranial Nerves: No cranial nerve deficit.     Motor: No weakness.     Coordination: Coordination normal.     Deep Tendon Reflexes: Reflexes are normal and symmetric. Reflexes normal.  Psychiatric:        Mood and Affect: Mood normal.           Assessment & Plan:   Problem List Items Addressed This Visit       Cardiovascular and Mediastinum   Hypertension    bp in fair control at this time  BP Readings from Last 1 Encounters:  01/06/23 133/66   No changes needed Most recent labs reviewed  Disc  lifstyle change with low sodium diet and exercise   Amlodipine 5 mg daily  Losartan 25 mg daily       Relevant Medications   losartan (COZAAR) 25 MG tablet     Endocrine   Type 2 diabetes mellitus with diabetic polyneuropathy, unspecified whether long term insulin use (HCC)    Continues endocrinology care  Has neuropathy  Per pt A1c in 6-7 range        Relevant Medications   amitriptyline (ELAVIL) 10 MG tablet   losartan (COZAAR) 25 MG tablet     Nervous and Auditory   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    Neuropathy- stocking/glove From past chemo and current DM2  Stopped lyrica and amitriptyline in past due to cognitive change  Is much improved now but struggling with neuropathy symptoms Checked in with neuro-no further advice besides consideration of pain clinic  Dw pt -wants to avoid that   Will try low dose amitriptyline at 10 mg at bedtime with close monitor for MS change or over sedation  Perhaps lower dose will be better tolerated  No changes in exam        Musculoskeletal and Integument   OA (osteoarthritis) of knee    Bilateral Goes to [facility]  Cortisone shots every 3 mo usually  Wants to avoid knee replacement         Other   Hypomagnesemia    Last level 1.4 on 11/18 Improving on mag ox  Worsened by chronic diarrhea   Encouraged her to  cut mag ox 400 into halves and spread out dosage to prevent side effects  Has seen pharmacist and discussed this        Diarrhea    Ongoing but improved Had noro virus recently  Eats yogurt for probiotic Metformin may worsen/ pt will d/w endocrinology  Magnesium may worsen but has to take with low level (ironically worsened by diarrhea)  Colonoscopy upcoming Encouraged pt to discuss with her GI specialist further        Depression with anxiety    Ongoing psychiatric care  Per pt almost all anxiety  Had grief after loss of husband also  Taking Zoloft 100 mg daily  Buspar 15 mg  (1.5 tabs bid)  Xanax 0.5 mg bid prn -has not taken any in a while / not often   After reduction of sedating medicines- is much more cognitively sharp   Consider re introduction of low dose amitriptyline for neuropathy with close monitor       Relevant Medications   amitriptyline (ELAVIL) 10 MG tablet   B12 deficiency    Lab Results  Component Value Date   VITAMINB12 310 01/04/2023   Oral supplementation       Anemia, iron deficiency    Improved with iron infusions Planning colonoscopy and egd  Lab Results  Component Value Date   WBC 6.9 01/04/2023   HGB 11.7 01/04/2023   HCT 38.2 01/04/2023   MCV 86 01/04/2023   PLT 352 01/04/2023   Lab Results  Component Value Date   IRON 80 01/04/2023   TIBC 340 01/04/2023   FERRITIN 313 (H) 01/04/2023

## 2023-01-06 NOTE — Assessment & Plan Note (Signed)
Continues endocrinology care  Has neuropathy  Per pt A1c in 6-7 range

## 2023-01-06 NOTE — Assessment & Plan Note (Signed)
bp in fair control at this time  BP Readings from Last 1 Encounters:  01/06/23 133/66   No changes needed Most recent labs reviewed  Disc lifstyle change with low sodium diet and exercise   Amlodipine 5 mg daily  Losartan 25 mg daily

## 2023-01-08 ENCOUNTER — Telehealth: Payer: Self-pay | Admitting: Psychiatry

## 2023-01-08 DIAGNOSIS — G4700 Insomnia, unspecified: Secondary | ICD-10-CM

## 2023-01-08 MED ORDER — MIRTAZAPINE 15 MG PO TABS
15.0000 mg | ORAL_TABLET | Freq: Every day | ORAL | Status: DC
Start: 2023-01-08 — End: 2023-04-02

## 2023-01-08 NOTE — Telephone Encounter (Signed)
FYI:  Please see patient's message. She is finally sleeping.

## 2023-01-08 NOTE — Telephone Encounter (Signed)
Please let her that Mirtazapine has been added back to her medication list and I am glad to hear she was finally able to sleep.

## 2023-01-08 NOTE — Telephone Encounter (Signed)
Pt  LVM @ 2:29p stating that she the Mirtazapine started to work last night and she got 7 hours of sleep.  She wanted Shanda Bumps to know so when she calls back for a refill, she knows why.  She also said that her PCP is putting her back on the Amitriptyline at a lower dosage starting on Monday to help with the neuropathy.  Overall, she said she is satisfied and wants to continue this route as long as she can get consistent sleep.  She said she doesn't need a call back and to tell Shanda Bumps thank you for everything she has done.

## 2023-01-21 ENCOUNTER — Ambulatory Visit: Payer: HMO | Admitting: General Practice

## 2023-01-21 ENCOUNTER — Encounter: Payer: Self-pay | Admitting: General Practice

## 2023-01-21 VITALS — BP 124/70 | HR 89 | Temp 97.9°F | Ht 66.0 in | Wt 181.0 lb

## 2023-01-21 DIAGNOSIS — L308 Other specified dermatitis: Secondary | ICD-10-CM | POA: Diagnosis not present

## 2023-01-21 MED ORDER — NYSTATIN 100000 UNIT/GM EX POWD: 1.0000 | Freq: Three times a day (TID) | CUTANEOUS | 0 refills | Status: DC

## 2023-01-21 MED ORDER — NYSTATIN 100000 UNIT/GM EX CREA: 1.0000 | TOPICAL_CREAM | Freq: Two times a day (BID) | CUTANEOUS | 0 refills | Status: DC

## 2023-01-21 NOTE — Patient Instructions (Signed)
Apply nystatin powder to the affected area up to three times daily.   I have sent the cream as well incase you need it.   If you develop fever, chills, please call and make an appointment with PCP.   It was a pleasure meeting you!

## 2023-01-21 NOTE — Assessment & Plan Note (Signed)
Presentation and symptoms suggestive of moisture associated dermatitis.   Allergy to diflucan.   Will treat with nystatin powder TID PRN. Rx sent. Also will send nystatin cream just incase she needs it. Patient advised to start with the powder.   She will update if symptoms worse and fail to improve.

## 2023-01-21 NOTE — Progress Notes (Signed)
Established Patient Office Visit  Subjective   Patient ID: Suzanne Strong, female    DOB: [birth year]  Age: 77 y.o. MRN: [id number]  Chief Complaint  Patient presents with   Rash    On lower abdomen since last night. Patient states it burns, no itching but very red. Patient has applied neosporin to the area.     Rash Pertinent negatives include no fever or shortness of breath.    Suzanne Strong is a 77 year old female, patient of Dr. Milinda Antis, presents today for an acute visit to discuss a spot on her abdomen.   She first noticed it yesterday (01/20/23). It is located on her lower abdomen. She describes it as red, itching and hot. She denies any pain or itching. It is only located on the left side. She tried neosporin with no relief. No injury. No new medication. Does not recall being outside or mosquito bite. No fever. No chills.   Patient Active Problem List   Diagnosis Date Noted   Dermatitis associated with moisture 01/21/2023   OA (osteoarthritis) of knee 01/06/2023   Type 2 diabetes mellitus with diabetic polyneuropathy, unspecified whether long term insulin use (HCC) 12/21/2022   Change in mental status 11/10/2022   Bronchiectasis without complication (HCC) 07/31/2022   Diarrhea 07/22/2022   Lymphadenopathy 06/24/2022   Poor balance 04/28/2022   Vision changes 02/12/2022   Polypharmacy 02/12/2022   At high risk for aspiration 04/23/2021   Recurrent aspiration pneumonia (HCC) 12/09/2020   Dysuria 07/17/2020   Dysphagia    Gastric erythema    Gastric polyp    Encounter for screening colonoscopy    Polyp of colon    History of aspiration pneumonia 02/15/2020   Diabetes mellitus without complication (HCC)    GERD (gastroesophageal reflux disease)    Hypertension    Urinary urgency 01/05/2020   Grief reaction 09/18/2019   Swallowing dysfunction 07/18/2019   Fever, intermittent 09/01/2018   Pre-syncope 02/07/2018   Episodic weakness 01/26/2018   Medicare annual  wellness visit, subsequent 06/29/2017   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   Preoperative examination 04/27/2017   Carpal tunnel syndrome of right wrist 11/13/2016   Obstructive sleep apnea 01/01/2016   Carcinoma of upper-outer quadrant of left breast in female, estrogen receptor negative (HCC) 11/21/2015   Hypomagnesemia 08/28/2015   Initial Medicare annual wellness visit 10/16/2014   Estrogen deficiency 10/16/2014   Colon cancer screening 10/16/2014   Controlled type 2 diabetes mellitus without complication (HCC) 03/28/2014   RLS (restless legs syndrome) 02/21/2014   Chronic neck pain 07/25/2010   Depression with anxiety 07/07/2010   Routine general medical examination at a health care facility 06/26/2010   B12 deficiency 09/04/2009   Anemia, iron deficiency 08/29/2009   Gastroparesis 08/26/2009   Vitamin D deficiency 07/19/2008   Disorder of bone and cartilage 07/19/2007   Hyperlipidemia 07/18/2007   Edema 07/18/2007   Past Medical History:  Diagnosis Date   Allergy 1999   Anemia    iron deficiency and B12 deficiency   Anxiety    Arthritis 2022   some in my knees   Breast cancer (HCC) 02/25/2015   upper-outer quadrant of left female breast, Triple Negative. Neo-adjuvant chemo with complete pathologic response.    Cataract Floating cataracts 2018   Cervical dysplasia    conization   Chronic kidney disease 2024   questionable   Diabetes mellitus without complication (HCC)    Diverticulosis    Dyspnea    Fever 04/11/2015  Gastroparesis    related to previous radiation tx   GERD (gastroesophageal reflux disease)    Hyperlipidemia    Hypertension    Neuromuscular disorder (HCC)    Neuropathy    legs/feet, S/P chemo meds   Personal history of chemotherapy    Personal history of radiation therapy 2017   LEFT lumpectomy w/ radiation   Pneumonia    RLS (restless legs syndrome)    Seizures (HCC) 2022   Shingles    Hx of   Sleep apnea    Tonsillar  cancer (HCC) 2006   Wears dentures    partial bottom   Past Surgical History:  Procedure Laterality Date   APPENDECTOMY     BREAST BIOPSY Left 02/25/2015   INVASIVE MAMMARY CARCINOMA,Triple negative.   BREAST CYST ASPIRATION     BREAST EXCISIONAL BIOPSY Left 12/2015   surgery   BREAST LUMPECTOMY WITH NEEDLE LOCALIZATION Left 10/02/2015   Procedure: BREAST LUMPECTOMY WITH NEEDLE LOCALIZATION;  Surgeon: Earline Mayotte, MD;  Location: [facility];  Service: General;  Laterality: Left;   BREAST LUMPECTOMY WITH SENTINEL LYMPH NODE BIOPSY Left 10/02/2015   Procedure: LEFT BREAST WIDE EXCISION WITH SENTINEL LYMPH NODE BX;  Surgeon: Earline Mayotte, MD;  Location: [facility];  Service: General;  Laterality: Left;   BREAST SURGERY     breast biopsy benign   CARPAL TUNNEL RELEASE Right 05/19/2017   Procedure: CARPAL TUNNEL RELEASE;  Surgeon: Deeann Saint, MD;  Location: [facility];  Service: Orthopedics;  Laterality: Right;   CATARACT EXTRACTION W/PHACO Right 10/26/2018   Procedure: CATARACT EXTRACTION PHACO AND INTRAOCULAR LENS PLACEMENT (IOC) right diabetic;  Surgeon: Lockie Mola, MD;  Location: [facility];  Service: Ophthalmology;  Laterality: Right;  Diabetic - oral meds cancer center been notified and will come   CATARACT EXTRACTION W/PHACO Left 11/16/2018   Procedure: CATARACT EXTRACTION PHACO AND INTRAOCULAR LENS PLACEMENT (IOC) LEFT DIABETIC  01:01.0  17.0%  10.37;  Surgeon: Lockie Mola, MD;  Location: [facility];  Service: Ophthalmology;  Laterality: Left;  Diabetic - oral meds Port-a-cath   CHOLECYSTECTOMY     Cologuard  07/22/2016   Negative   COLONOSCOPY WITH PROPOFOL N/A 05/24/2020   Procedure: COLONOSCOPY WITH PROPOFOL;  Surgeon: Pasty Spillers, MD;  Location: [facility];  Service: Endoscopy;  Laterality: N/A;   ESOPHAGOGASTRODUODENOSCOPY  07/2009   normal with few gastric polyps   ESOPHAGOGASTRODUODENOSCOPY (EGD) WITH PROPOFOL N/A  05/24/2020   Procedure: ESOPHAGOGASTRODUODENOSCOPY (EGD) WITH PROPOFOL;  Surgeon: Pasty Spillers, MD;  Location: [facility];  Service: Endoscopy;  Laterality: N/A;   EYE SURGERY  10/26/2018   FLEXIBLE BRONCHOSCOPY Bilateral 06/24/2022   Procedure: FLEXIBLE BRONCHOSCOPY;  Surgeon: [name], MD;  Location: [facility];  Service: Pulmonary;  Laterality: Bilateral;   FRACTURE SURGERY  1950 ish   broke my left arm as a child   MOLE REMOVAL     PORT-A-CATH REMOVAL     PORTACATH PLACEMENT     PORTACATH PLACEMENT Right 03/12/2015   Procedure: INSERTION PORT-A-CATH;  Surgeon: Earline Mayotte, MD;  Location: [facility];  Service: General;  Laterality: Right;   RADICAL NECK DISSECTION     TONSILLECTOMY Left    cancer treated with chemo and radiation   TRIGGER FINGER RELEASE Right 05/19/2017   Procedure: RELEASE TRIGGER FINGER/A-1 PULLEY ring finger;  Surgeon: Deeann Saint, MD;  Location: [facility];  Service: Orthopedics;  Laterality: Right;   VIDEO BRONCHOSCOPY WITH ENDOBRONCHIAL ULTRASOUND Bilateral 06/24/2022   Procedure: VIDEO BRONCHOSCOPY  WITH ENDOBRONCHIAL ULTRASOUND;  Surgeon: [name], MD;  Location: [facility];  Service: Pulmonary;  Laterality: Bilateral;   Allergies  Allergen Reactions   Amoxicillin Swelling    REACTION: rash, swelling Has patient had a PCN reaction causing immediate rash, facial/tongue/throat swelling, SOB or lightheadedness with hypotension: yes Has patient had a PCN reaction causing severe rash involving mucus membranes or skin necrosis: no Has patient had a PCN reaction that required hospitalization: no Has patient had a PCN reaction occurring within the last 10 years: yes If all of the above answers are "NO", then may proceed with Cephalosporin use.  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) - pt may have difficulty swallowing         01/21/2023   10:16 AM 01/06/2023   10:51 AM 12/21/2022   10:29 AM  Depression screen PHQ 2/9  Decreased Interest 0 0 0  Down, Depressed, Hopeless 0 0 0  PHQ - 2 Score 0 0 0  Altered sleeping 1 3 3   Tired, decreased energy 1 1 3   Change in appetite 0 0 2  Feeling bad or failure about yourself  0 0 0  Trouble concentrating 0 0 0  Moving slowly or fidgety/restless 0 0 0  Suicidal thoughts 0 0 0  PHQ-9 Score 2 4 8   Difficult doing work/chores Not difficult at all Somewhat difficult Somewhat difficult       01/06/2023   10:52 AM 12/21/2022   10:29 AM 11/10/2022   10:36 AM 08/18/2022   12:23 PM  GAD 7 : Generalized Anxiety Score  Nervous, Anxious, on Edge 0 2 0 1  Control/stop worrying 0 0 1 1  Worry too much - different things 0 0 1 0  Trouble relaxing 1 0 1 0  Restless 0 0 0 1  Easily annoyed or irritable 0 0 0 0  Afraid - awful might happen 0 0 0 0  Total GAD 7 Score 1 2 3 3   Anxiety Difficulty Not difficult at all Somewhat difficult Somewhat difficult Not difficult at all      Review of Systems  Constitutional:  Negative for chills and fever.  Respiratory:  Negative for shortness of breath.   Cardiovascular:  Negative for chest pain.  Skin:  Positive for itching.       Spot on abdomen.  Neurological:  Negative for dizziness and headaches.  Psychiatric/Behavioral:  Negative for depression. The patient is not nervous/anxious.       Objective:     BP 124/70 (BP Location: Left Arm, Patient Position: Sitting, Cuff Size: Normal)   Pulse 89   Temp 97.9 F (36.6 C) (Oral)   Ht 5\' 6"  (1.676 m)   Wt 181 lb (82.1 kg)   SpO2 100%   BMI 29.21 kg/m  BP Readings from Last 3 Encounters:  01/21/23 124/70  01/06/23 133/66  01/05/23 (!) 118/57   Wt Readings from Last 3 Encounters:  01/21/23 181 lb (82.1 kg)  01/06/23 180 lb 4 oz (81.8 kg)  01/04/23 179 lb 3.2 oz (81.3  kg)      Physical Exam Vitals and nursing note reviewed.  Constitutional:      Appearance: Normal appearance.  Cardiovascular:     Rate and Rhythm: Normal  rate and regular rhythm.     Pulses: Normal pulses.     Heart sounds: Normal heart sounds.  Pulmonary:     Effort: Pulmonary effort is normal.     Breath sounds: Normal breath sounds.  Abdominal:     Comments: Moisture associated dermatitis in left lower abdominal fold.  Skin:    General: Skin is warm.     Capillary Refill: Capillary refill takes less than 2 seconds.     Findings: Erythema present.     Comments: Left sided moisture associated dermatitis in lower abdomen fold. No open skin areas.  Neurological:     General: No focal deficit present.     Mental Status: She is alert and oriented to person, place, and time.  Psychiatric:        Mood and Affect: Mood normal.        Behavior: Behavior normal.        Thought Content: Thought content normal.      No results found for any visits on 01/21/23.     The ASCVD Risk score (Arnett DK, et al., 2019) failed to calculate for the following reasons:   The valid total cholesterol range is 130 to 320 mg/dL    Assessment & Plan:  Dermatitis associated with moisture Assessment & Plan: Presentation and symptoms suggestive of moisture associated dermatitis.   Allergy to diflucan.   Will treat with nystatin powder TID PRN. Rx sent. Also will send nystatin cream just incase she needs it. Patient advised to start with the powder.   She will update if symptoms worse and fail to improve.   Orders: -     Nystatin; Apply 1 Application topically 3 (three) times daily.  Dispense: 30 g; Refill: 0 -     Nystatin; Apply 1 Application topically 2 (two) times daily.  Dispense: 15 g; Refill: 0     Return if symptoms worsen or fail to improve.    Modesto Charon, NP

## 2023-02-02 ENCOUNTER — Other Ambulatory Visit: Payer: Self-pay

## 2023-02-02 ENCOUNTER — Encounter
Admission: RE | Disposition: A | Discharge status: Home / Self Care | Payer: Self-pay | Source: Home / Self Care | Attending: Gastroenterology

## 2023-02-02 ENCOUNTER — Ambulatory Visit: Payer: HMO | Admitting: Certified Registered"

## 2023-02-02 ENCOUNTER — Encounter: Payer: Self-pay | Admitting: Gastroenterology

## 2023-02-02 ENCOUNTER — Ambulatory Visit
Admission: RE | Admit: 2023-02-02 | Discharge: 2023-02-02 | Disposition: A | Discharge status: Home / Self Care | Payer: HMO | Attending: Gastroenterology | Admitting: Gastroenterology

## 2023-02-02 DIAGNOSIS — Z7984 Long term (current) use of oral hypoglycemic drugs: Secondary | ICD-10-CM | POA: Diagnosis not present

## 2023-02-02 DIAGNOSIS — K2950 Unspecified chronic gastritis without bleeding: Secondary | ICD-10-CM | POA: Diagnosis not present

## 2023-02-02 DIAGNOSIS — R1310 Dysphagia, unspecified: Secondary | ICD-10-CM | POA: Diagnosis not present

## 2023-02-02 DIAGNOSIS — K219 Gastro-esophageal reflux disease without esophagitis: Secondary | ICD-10-CM | POA: Diagnosis not present

## 2023-02-02 DIAGNOSIS — K5730 Diverticulosis of large intestine without perforation or abscess without bleeding: Secondary | ICD-10-CM | POA: Insufficient documentation

## 2023-02-02 DIAGNOSIS — D120 Benign neoplasm of cecum: Secondary | ICD-10-CM | POA: Insufficient documentation

## 2023-02-02 DIAGNOSIS — D631 Anemia in chronic kidney disease: Secondary | ICD-10-CM | POA: Diagnosis not present

## 2023-02-02 DIAGNOSIS — E1122 Type 2 diabetes mellitus with diabetic chronic kidney disease: Secondary | ICD-10-CM | POA: Diagnosis not present

## 2023-02-02 DIAGNOSIS — Z79899 Other long term (current) drug therapy: Secondary | ICD-10-CM | POA: Diagnosis not present

## 2023-02-02 DIAGNOSIS — R0602 Shortness of breath: Secondary | ICD-10-CM | POA: Insufficient documentation

## 2023-02-02 DIAGNOSIS — N189 Chronic kidney disease, unspecified: Secondary | ICD-10-CM | POA: Diagnosis not present

## 2023-02-02 DIAGNOSIS — G4730 Sleep apnea, unspecified: Secondary | ICD-10-CM | POA: Insufficient documentation

## 2023-02-02 DIAGNOSIS — K635 Polyp of colon: Secondary | ICD-10-CM | POA: Diagnosis not present

## 2023-02-02 DIAGNOSIS — R569 Unspecified convulsions: Secondary | ICD-10-CM | POA: Insufficient documentation

## 2023-02-02 DIAGNOSIS — Z87891 Personal history of nicotine dependence: Secondary | ICD-10-CM | POA: Insufficient documentation

## 2023-02-02 DIAGNOSIS — E1143 Type 2 diabetes mellitus with diabetic autonomic (poly)neuropathy: Secondary | ICD-10-CM | POA: Insufficient documentation

## 2023-02-02 DIAGNOSIS — I129 Hypertensive chronic kidney disease with stage 1 through stage 4 chronic kidney disease, or unspecified chronic kidney disease: Secondary | ICD-10-CM | POA: Diagnosis not present

## 2023-02-02 DIAGNOSIS — D509 Iron deficiency anemia, unspecified: Secondary | ICD-10-CM | POA: Insufficient documentation

## 2023-02-02 DIAGNOSIS — F419 Anxiety disorder, unspecified: Secondary | ICD-10-CM | POA: Insufficient documentation

## 2023-02-02 DIAGNOSIS — F32A Depression, unspecified: Secondary | ICD-10-CM | POA: Diagnosis not present

## 2023-02-02 DIAGNOSIS — Z860100 Personal history of colon polyps, unspecified: Secondary | ICD-10-CM

## 2023-02-02 HISTORY — PX: BIOPSY: SHX5522

## 2023-02-02 HISTORY — PX: ESOPHAGOGASTRODUODENOSCOPY (EGD) WITH PROPOFOL: SHX5813

## 2023-02-02 HISTORY — PX: COLONOSCOPY WITH PROPOFOL: SHX5780

## 2023-02-02 LAB — GLUCOSE, CAPILLARY: Glucose-Capillary: 148 mg/dL — ABNORMAL HIGH (ref 70–99)

## 2023-02-02 SURGERY — ESOPHAGOGASTRODUODENOSCOPY (EGD) WITH PROPOFOL
Anesthesia: General

## 2023-02-02 MED ORDER — SODIUM CHLORIDE 0.9 % IV SOLN: INTRAVENOUS | Status: DC

## 2023-02-02 MED ORDER — LIDOCAINE HCL (PF) 2 % IJ SOLN
INTRAMUSCULAR | Status: DC | PRN
  Administered 2023-02-02: 200 mg via INTRADERMAL

## 2023-02-02 MED ORDER — PROPOFOL 500 MG/50ML IV EMUL
INTRAVENOUS | Status: DC | PRN
  Administered 2023-02-02: 50 mg via INTRAVENOUS
  Administered 2023-02-02: 150 ug/kg/min via INTRAVENOUS

## 2023-02-02 NOTE — Op Note (Signed)
[facility]enter[facility] Patient Name: Suzanne Strong Procedure Date: 02/02/2023 8:16 AM MRN: [id number] Account #: [id number] Date of Birth: 1945/12/11 Admit Type: Outpatient Age: 77 Room: [facility] ENDO ROOM 2 Gender: Female Note Status: Finalized Instrument Name: Patton Salles Endoscope [id number] Endoscope [id number] Procedure:             Upper GI endoscopy Indications:           Iron deficiency anemia Providers:             [name] MD, MD Medicines:             Monitored Anesthesia Care Complications:         No immediate complications. Procedure:             Pre-Anesthesia Assessment:                        - Prior to the procedure, a History and Physical was                         performed, and patient medications, allergies and                         sensitivities were reviewed. The patient's tolerance                         of previous anesthesia was reviewed.                        - The risks and benefits of the procedure and the                         sedation options and risks were discussed with the                         patient. All questions were answered and informed                         consent was obtained.                        After obtaining informed consent, the endoscope was                         passed under direct vision. Throughout the procedure,                         the patient's blood pressure, pulse, and oxygen                         saturations were monitored continuously. The Endoscope                         was introduced through the mouth, and advanced to the                         third part of duodenum. The Endoscope was introduced                         through the and advanced to the third part of  duodenum. The upper GI endoscopy was accomplished with                         ease. The patient tolerated the procedure well. Findings:      The esophagus was normal.      Diffuse moderate  inflammation characterized by congestion (edema) and       erythema was found on the anterior wall of the gastric antrum. Biopsies       were taken with a cold forceps for histology.      The examined duodenum was normal. Biopsies for histology were taken with       a cold forceps for evaluation of celiac disease.      The cardia and gastric fundus were normal on retroflexion. Impression:            - Normal esophagus.                        - Gastritis. Biopsied.                        - Normal examined duodenum. Biopsied. Recommendation:        - Await pathology results.                        - Perform a colonoscopy today. Procedure Code(s):     --- Professional ---                        [id number], Esophagogastroduodenoscopy, flexible,                         transoral; with biopsy, single or multiple Diagnosis Code(s):     --- Professional ---                        K29.70, Gastritis, unspecified, without bleeding                        D50.9, Iron deficiency anemia, unspecified CPT copyright 2022 American Medical Association. All rights reserved. The codes documented in this report are preliminary and upon coder review may  be revised to meet current compliance requirements. [name], MD [name] MD, MD 02/02/2023 8:34:49 AM This report has been signed electronically. Number of Addenda: 0 Note Initiated On: 02/02/2023 8:16 AM Estimated Blood Loss:  Estimated blood loss: none.      [facility]

## 2023-02-02 NOTE — Anesthesia Preprocedure Evaluation (Signed)
Anesthesia Evaluation  Patient identified by MRN, date of birth, ID band Patient awake    Reviewed: Allergy & Precautions, H&P , NPO status , Patient's Chart, lab work & pertinent test results, reviewed documented beta blocker date and time   History of Anesthesia Complications Negative for: history of anesthetic complications  Airway Mallampati: II  TM Distance: <3 FB Neck ROM: limited Positive for:  Tracheal deviation Mouth opening: Limited Mouth Opening  Dental  (+) Teeth Intact, Edentulous Lower, Dental Advidsory Given   Pulmonary shortness of breath, sleep apnea , pneumonia, resolved, neg COPD, neg recent URI, former smoker   Pulmonary exam normal        Cardiovascular Exercise Tolerance: Poor hypertension, On Medications (-) angina (-) Past MI and (-) Cardiac Stents negative cardio ROS Normal cardiovascular exam(-) dysrhythmias (-) Valvular Problems/Murmurs Rhythm:regular Rate:Normal     Neuro/Psych Seizures -, Well Controlled,  PSYCHIATRIC DISORDERS Anxiety Depression     Neuromuscular disease    GI/Hepatic Neg liver ROS,GERD  Medicated,,  Endo/Other  negative endocrine ROSdiabetes, Well Controlled, Type 2    Renal/GU CRFRenal disease  negative genitourinary   Musculoskeletal   Abdominal   Peds  Hematology  (+) Blood dyscrasia, anemia   Anesthesia Other Findings Past Medical History: No date: Anemia     Comment:  iron deficiency and B12 deficiency No date: Anxiety 02/25/2015: Breast cancer (HCC)     Comment:  upper-outer quadrant of left female breast, Triple               Negative. Neo-adjuvant chemo with complete pathologic               response.  No date: Cervical dysplasia     Comment:  conization No date: Diabetes mellitus without complication (HCC) No date: Diverticulosis No date: Dyspnea 04/11/2015: Fever No date: Gastroparesis     Comment:  related to previous radiation tx No date: GERD  (gastroesophageal reflux disease) No date: Hyperlipidemia No date: Hypertension No date: Neuromuscular disorder (HCC) No date: Neuropathy     Comment:  legs/feet, S/P chemo meds No date: Personal history of chemotherapy 2017: Personal history of radiation therapy     Comment:  LEFT lumpectomy w/ radiation No date: Pneumonia No date: RLS (restless legs syndrome) No date: Shingles     Comment:  Hx of No date: Sleep apnea 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 No date: BREAST CYST ASPIRATION 12/2015: BREAST EXCISIONAL BIOPSY; Left     Comment:  surgery 10/02/2015: BREAST LUMPECTOMY WITH NEEDLE LOCALIZATION; Left     Comment:  Procedure: BREAST LUMPECTOMY WITH NEEDLE LOCALIZATION;                Surgeon: Earline Mayotte, MD;  Location: [facility] OR[facility];                Service: General;  Laterality: Left; 10/02/2015: BREAST LUMPECTOMY WITH SENTINEL LYMPH NODE BIOPSY; Left     Comment:  Procedure: LEFT BREAST WIDE EXCISION WITH SENTINEL LYMPH              NODE BX;  Surgeon: Earline Mayotte, MD;  Location: [facility]              ORS;  Service: General;  Laterality: Left; No date: BREAST SURGERY     Comment:  breast biopsy benign 05/19/2017:  CARPAL TUNNEL RELEASE; Right     Comment:  Procedure: CARPAL TUNNEL RELEASE;  Surgeon: Deeann Saint, MD;  Location: [facility] OR[facility];  Service: Orthopedics;                Laterality: Right; 10/26/2018: CATARACT EXTRACTION W/PHACO; Right     Comment:  Procedure: CATARACT EXTRACTION PHACO AND INTRAOCULAR               LENS PLACEMENT (IOC) right diabetic;  Surgeon:               Lockie Mola, MD;  Location: [facility];              Service: Ophthalmology;  Laterality: Right;  Diabetic -               oral meds cancer center been notified and will  come 11/16/2018: CATARACT EXTRACTION W/PHACO; Left     Comment:  Procedure: CATARACT EXTRACTION PHACO AND INTRAOCULAR               LENS PLACEMENT (IOC) LEFT DIABETIC  01:01.0  17.0%                10.37;  Surgeon: Lockie Mola, MD;  Location:               [facility];  Service: Ophthalmology;                Laterality: Left;  Diabetic - oral meds Port-a-cath No date: CHOLECYSTECTOMY 07/22/2016: Cologuard     Comment:  Negative 05/24/2020: COLONOSCOPY WITH PROPOFOL; N/A     Comment:  Procedure: COLONOSCOPY WITH PROPOFOL;  Surgeon:               Pasty Spillers, MD;  Location: [facility];                Service: Endoscopy;  Laterality: N/A; 07/2009: ESOPHAGOGASTRODUODENOSCOPY     Comment:  normal with few gastric polyps 05/24/2020: ESOPHAGOGASTRODUODENOSCOPY (EGD) WITH PROPOFOL; N/A     Comment:  Procedure: ESOPHAGOGASTRODUODENOSCOPY (EGD) WITH               PROPOFOL;  Surgeon: Pasty Spillers, MD;  Location:               [facility];  Service: Endoscopy;  Laterality: N/A; No date: MOLE REMOVAL No date: PORT-A-CATH REMOVAL No date: PORTACATH PLACEMENT 03/12/2015: PORTACATH PLACEMENT; Right     Comment:  Procedure: INSERTION PORT-A-CATH;  Surgeon: Earline Mayotte, MD;  Location: [facility] OR[facility];  Service: General;                Laterality: Right; No date: RADICAL NECK DISSECTION No date: TONSILLECTOMY; Left     Comment:  cancer treated with chemo and radiation 05/19/2017: TRIGGER FINGER RELEASE; Right     Comment:  Procedure: RELEASE TRIGGER FINGER/A-1 PULLEY ring               finger;  Surgeon: Deeann Saint, MD;  Location: [facility]               ORS;  Service: Orthopedics;  Laterality: Right; BMI    Body Mass Index: 34.86 kg/m     Reproductive/Obstetrics negative OB ROS  Anesthesia Physical Anesthesia Plan  ASA: 3  Anesthesia Plan: General   Post-op Pain Management:     Induction: Intravenous  PONV Risk Score and Plan: 3 and Propofol infusion and TIVA  Airway Management Planned: Natural Airway and Nasal Cannula  Additional Equipment:   Intra-op Plan:   Post-operative Plan:   Informed Consent: I have reviewed the patients History and Physical, chart, labs and discussed the procedure including the risks, benefits and alternatives for the proposed anesthesia with the patient or authorized representative who has indicated his/her understanding and acceptance.     Dental Advisory Given  Plan Discussed with: CRNA  Anesthesia Plan Comments:        Anesthesia Quick Evaluation

## 2023-02-02 NOTE — Transfer of Care (Signed)
Immediate Anesthesia Transfer of Care Note  Patient: Suzanne Strong  Procedure(s) Performed: ESOPHAGOGASTRODUODENOSCOPY (EGD) WITH PROPOFOL COLONOSCOPY WITH PROPOFOL BIOPSY  Patient Location: PACU  Anesthesia Type:General  Level of Consciousness: drowsy and patient cooperative  Airway & Oxygen Therapy: Patient Spontanous Breathing  Post-op Assessment: Report given to RN and Post -op Vital signs reviewed and stable  Post vital signs: stable  Last Vitals:  Vitals Value Taken Time  BP    Temp    Pulse 70 02/02/23 0853  Resp 20 02/02/23 0853  SpO2 95 % 02/02/23 0853  Vitals shown include unfiled device data.  Last Pain:  Vitals:   02/02/23 0746  TempSrc: Temporal  PainSc: 0-No pain         Complications: No notable events documented.

## 2023-02-02 NOTE — Op Note (Signed)
[facility] Patient Name: Suzanne Strong Procedure Date: 02/02/2023 8:20 AM MRN: [id number] Account #: [id number] Date of Birth: 11-Sep-1945 Admit Type: Outpatient Age: 77 Room: [facility] ENDO ROOM 2 Gender: Female Note Status: Finalized Instrument Name: Prentice Docker [id number] Procedure:             Colonoscopy Indications:           Iron deficiency anemia Providers:             [name] MD, MD Medicines:             Monitored Anesthesia Care Complications:         No immediate complications. Procedure:             Pre-Anesthesia Assessment:                        - Prior to the procedure, a History and Physical was                         performed, and patient medications, allergies and                         sensitivities were reviewed. The patient's tolerance                         of previous anesthesia was reviewed.                        - The risks and benefits of the procedure and the                         sedation options and risks were discussed with the                         patient. All questions were answered and informed                         consent was obtained.                        - ASA Grade Assessment: II - A patient with mild                         systemic disease.                        After obtaining informed consent, the colonoscope was                         passed under direct vision. Throughout the procedure,                         the patient's blood pressure, pulse, and oxygen                         saturations were monitored continuously. The                         Colonoscope was introduced through the anus and  advanced to the the cecum, identified by the                         appendiceal orifice. The colonoscopy was performed                         with ease. The patient tolerated the procedure well.                         The quality of the bowel preparation was excellent.                          The ileocecal valve, appendiceal orifice, and rectum                         were photographed. Findings:      The perianal and digital rectal examinations were normal.      Multiple medium-mouthed diverticula were found in the sigmoid colon.      A 3 mm polyp was found in the cecum. The polyp was sessile. The polyp       was removed with a cold biopsy forceps. Resection and retrieval were       complete.      The exam was otherwise without abnormality on direct and retroflexion       views. Impression:            - Diverticulosis in the sigmoid colon.                        - One 3 mm polyp in the cecum, removed with a cold                         biopsy forceps. Resected and retrieved.                        - The examination was otherwise normal on direct and                         retroflexion views. Recommendation:        - Discharge patient to home (with escort).                        - Resume previous diet.                        - Continue present medications.                        - Await pathology results.                        - Repeat colonoscopy is not recommended due to current                         age (62 years or older) for surveillance.                        - Return to GI office as previously scheduled. Procedure Code(s):     --- Professional ---  40981, Colonoscopy, flexible; with biopsy, single or                         multiple Diagnosis Code(s):     --- Professional ---                        D12.0, Benign neoplasm of cecum                        D50.9, Iron deficiency anemia, unspecified                        K57.30, Diverticulosis of large intestine without                         perforation or abscess without bleeding CPT copyright 2022 American Medical Association. All rights reserved. The codes documented in this report are preliminary and upon coder review may  be revised to meet current compliance  requirements. [name], MD [name] MD, MD 02/02/2023 8:51:11 AM This report has been signed electronically. Number of Addenda: 0 Note Initiated On: 02/02/2023 8:20 AM Scope Withdrawal Time: 0 hours 10 minutes 3 seconds  Estimated Blood Loss:  Estimated blood loss: none.      [facility]

## 2023-02-02 NOTE — H&P (Signed)
[name], MD [address], [place], [place], [address] [address], [place], [place], [address] Phone: [phone]  Fax: [phone]  Primary Care Physician:  Tower, [name], MD   Pre-Procedure History & Physical: HPI:  Suzanne Strong is a 77 y.o. female is here for an endoscopy and colonoscopy    Past Medical History:  Diagnosis Date   Allergy 1999   Anemia    iron deficiency and B12 deficiency   Anxiety    Arthritis 2022   some in my knees   Breast cancer (HCC) 02/25/2015   upper-outer quadrant of left female breast, Triple Negative. Neo-adjuvant chemo with complete pathologic response.    Cataract Floating cataracts 2018   Cervical dysplasia    conization   Chronic kidney disease 2024   questionable   Diabetes mellitus without complication (HCC)    Diverticulosis    Dyspnea    Fever 04/11/2015   Gastroparesis    related to previous radiation tx   GERD (gastroesophageal reflux disease)    Hyperlipidemia    Hypertension    Neuromuscular disorder (HCC)    Neuropathy    legs/feet, S/P chemo meds   Personal history of chemotherapy    Personal history of radiation therapy 2017   LEFT lumpectomy w/ radiation   Pneumonia    RLS (restless legs syndrome)    Seizures (HCC) 2022   Shingles    Hx of   Sleep apnea    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HCC) 2006   Wears dentures    partial bottom    Past Surgical History:  Procedure Laterality Date   APPENDECTOMY     BREAST BIOPSY Left 02/25/2015   INVASIVE MAMMARY CARCINOMA,Triple negative.   BREAST CYST ASPIRATION     BREAST EXCISIONAL BIOPSY Left 12/2015   surgery   BREAST LUMPECTOMY WITH NEEDLE LOCALIZATION Left 10/02/2015   Procedure: BREAST LUMPECTOMY WITH NEEDLE LOCALIZATION;  Surgeon: Earline Mayotte, MD;  Location: [facility] ORS;  Service: General;  Laterality: Left;   BREAST LUMPECTOMY WITH SENTINEL LYMPH NODE BIOPSY Left 10/02/2015   Procedure: LEFT BREAST WIDE EXCISION WITH SENTINEL  LYMPH NODE BX;  Surgeon: Earline Mayotte, MD;  Location: [facility] ORS;  Service: General;  Laterality: Left;   BREAST SURGERY     breast biopsy benign   CARPAL TUNNEL RELEASE Right 05/19/2017   Procedure: CARPAL TUNNEL RELEASE;  Surgeon: Deeann Saint, MD;  Location: [facility] OR[facility];  Service: Orthopedics;  Laterality: Right;   CATARACT EXTRACTION W/PHACO Right 10/26/2018   Procedure: CATARACT EXTRACTION PHACO AND INTRAOCULAR LENS PLACEMENT (IOC) right diabetic;  Surgeon: Lockie Mola, MD;  Location: [facility];  Service: Ophthalmology;  Laterality: Right;  Diabetic - oral meds cancer center been notified and will come   CATARACT EXTRACTION W/PHACO Left 11/16/2018   Procedure: CATARACT EXTRACTION PHACO AND INTRAOCULAR LENS PLACEMENT (IOC) LEFT DIABETIC  01:01.0  17.0%  10.37;  Surgeon: Lockie Mola, MD;  Location: [facility];  Service: Ophthalmology;  Laterality: Left;  Diabetic - oral meds Port-a-cath   CHOLECYSTECTOMY     Cologuard  07/22/2016   Negative   COLONOSCOPY WITH PROPOFOL N/A 05/24/2020   Procedure: COLONOSCOPY WITH PROPOFOL;  Surgeon: Pasty Spillers, MD;  Location: [facility] END[facility];  Service: Endoscopy;  Laterality: N/A;   ESOPHAGOGASTRODUODENOSCOPY  07/2009   normal with few gastric polyps   ESOPHAGOGASTRODUODENOSCOPY (EGD) WITH PROPOFOL N/A 05/24/2020   Procedure: ESOPHAGOGASTRODUODENOSCOPY (EGD) WITH PROPOFOL;  Surgeon: Pasty Spillers, MD;  Location:  [facility] END[facility];  Service: Endoscopy;  Laterality: N/A;   EYE SURGERY  10/26/2018   FLEXIBLE BRONCHOSCOPY Bilateral 06/24/2022   Procedure: FLEXIBLE BRONCHOSCOPY;  Surgeon: [name], MD;  Location: [facility] OR[facility];  Service: Pulmonary;  Laterality: Bilateral;   FRACTURE SURGERY  1948 ish   broke my left arm as a child   MOLE REMOVAL     PORT-A-CATH REMOVAL     PORTACATH PLACEMENT     PORTACATH PLACEMENT Right 03/12/2015   Procedure: INSERTION PORT-A-CATH;  Surgeon: Earline Mayotte, MD;   Location: [facility];  Service: General;  Laterality: Right;   RADICAL NECK DISSECTION     TONSILLECTOMY Left    cancer treated with chemo and radiation   TRIGGER FINGER RELEASE Right 05/19/2017   Procedure: RELEASE TRIGGER FINGER/A-1 PULLEY ring finger;  Surgeon: Deeann Saint, MD;  Location: [facility];  Service: Orthopedics;  Laterality: Right;   VIDEO BRONCHOSCOPY WITH ENDOBRONCHIAL ULTRASOUND Bilateral 06/24/2022   Procedure: VIDEO BRONCHOSCOPY WITH ENDOBRONCHIAL ULTRASOUND;  Surgeon: [name], MD;  Location: [facility];  Service: Pulmonary;  Laterality: Bilateral;    Prior to Admission medications   Medication Sig Start Date End Date Taking? Authorizing Provider  albuterol (VENTOLIN HFA) 108 (90 Base) MCG/ACT inhaler Inhale 2 puffs into the lungs every 6 (six) hours as needed for wheezing or shortness of breath. 03/10/22  Yes Tower, [name], MD  b complex vitamins tablet Take 1 tablet by mouth daily.   Yes [provider]  BIOTIN PO Take by mouth.   Yes [provider]  Cholecalciferol (VITAMIN D-1000 MAX ST) 25 MCG (1000 UT) tablet Take 2,000 Units by mouth daily. Takes three 50 mcg tablets, 3 capsules daily   Yes [provider]  Emollient (COLLAGEN EX)    Yes [provider]  magnesium oxide (MAG-OX) 400 (240 Mg) MG tablet Take 1 tablet (400 mg total) by mouth 2 (two) times daily. Patient taking differently: Take 400 mg by mouth daily. 11/16/22  Yes Tower, [name], MD  Multiple Vitamins-Minerals (WOMENS MULTIVITAMIN PO) Take by mouth.   Yes [provider]  zinc gluconate 50 MG tablet Take 50 mg by mouth daily.   Yes [provider]  acetaminophen (TYLENOL) 325 MG tablet Take 2 tablets (650 mg total) by mouth every 6 (six) hours as needed for mild pain (or Fever >/= 101). 05/30/20   Zannie Cove, MD  albuterol (PROVENTIL) (2.5 MG/3ML) 0.083% nebulizer solution Take 3 mLs (2.5 mg total) by nebulization in the morning and at bedtime.  05/26/22 05/26/23  [name], MD  ALPRAZolam Prudy Feeler) 0.5 MG tablet Take 1 tablet by mouth twice daily as needed for anxiety 01/01/23   Corie Chiquito, PMHNP  amitriptyline (ELAVIL) 10 MG tablet Take 1 tablet (10 mg total) by mouth at bedtime. 01/06/23   Tower, [name], MD  amLODipine (NORVASC) 5 MG tablet Take 5 mg by mouth daily.    [provider]  atorvastatin (LIPITOR) 10 MG tablet TAKE ONE TABLET BY MOUTH ONCE DAILY 03/10/22   Tower, [name], MD  busPIRone (BUSPAR) 15 MG tablet Take 1.5 tabs po BID 11/17/22   Corie Chiquito, PMHNP  famotidine (PEPCID) 20 MG tablet Take 1 tablet by mouth twice daily 12/11/22   Tower, [name], MD  glipiZIDE (GLUCOTROL) 5 MG tablet Take 2 tablets (10 mg total) by mouth daily before breakfast. Patient taking differently: Take 10 mg by mouth 2 (two) times daily before a meal. 08/18/21   Joaquim Nam, MD  glucose blood (ONETOUCH ULTRA) test strip USE TO check blood glucose ONCE DAILY AND AS NEEDED FOR DIABETES 12/03/20   Tower, [name], MD  losartan (COZAAR) 25 MG tablet Take 1 tablet (25 mg total) by mouth daily. 01/06/23   Tower, [name], MD  metFORMIN (GLUCOPHAGE) 500 MG tablet Take 2,000 mg by mouth daily with breakfast.    [provider]  mirtazapine (REMERON) 15 MG tablet Take 1 tablet (15 mg total) by mouth at bedtime. 01/08/23   Corie Chiquito, PMHNP  nystatin (MYCOSTATIN/NYSTOP) powder Apply 1 Application topically 3 (three) times daily. 01/21/23   Modesto Charon, NP  nystatin cream (MYCOSTATIN) Apply 1 Application topically 2 (two) times daily. 01/21/23   [place] Charon, NP  omeprazole (PRILOSEC) 20 MG capsule TAKE ONE CAPSULE BY MOUTH EVERYDAY AT BEDTIME 03/10/22   Tower, [name], MD  ondansetron (ZOFRAN) 4 MG tablet Take 1 tablet (4 mg total) by mouth daily as needed. 12/21/22   Tower, [name], MD  rOPINIRole (REQUIP) 1 MG tablet TAKE 1/2 TABLET BY MOUTH EVERY MORNING and TAKE THREE TABLETS BY MOUTH EVERYDAY AT BEDTIME 03/10/22   Tower,  [name], MD  sertraline (ZOLOFT) 50 MG tablet Take 1/2 tab po qd x 2-4 days, then 1 tab po qd x 1 week, then 2 tabs po qd 11/20/22   Corie Chiquito, PMHNP  Sodium Chloride (HYPERSAL) 3.5 % NEBU Take 1 each by nebulization 2 (two) times daily as needed. 07/31/22   Parrett, Virgel Bouquet, NP    Allergies as of 01/04/2023 - Review Complete 01/04/2023  Allergen Reaction Noted   Amoxicillin Swelling 07/18/2007   Penicillin g Anaphylaxis 04/08/2020   Fluconazole Rash    Difuleron [ferrous fumarate-dss]  12/08/2017   Other Other (See Comments) 05/10/2017    Family History  Problem Relation Age of Onset   Osteoporosis Mother    Hyperlipidemia Mother    Depression Mother    Cancer Mother        skin cancer ? basal cell and lung ca heavy smoker   Anxiety disorder Mother    COPD Mother    Drug abuse Mother    Miscarriages / [place] Mother    Varicose Veins Mother    Alcohol abuse Father    Cancer Father        skin CA ? basal cell   Heart disease Father        CHF   Hearing loss Father    Hyperlipidemia Brother    Hypertension Brother    Breast cancer Neg Hx     Social History   Socioeconomic History   Marital status: Widowed    Spouse name: Not on file   Number of children: 0   Years of education: 14   Highest education level: Some college, no degree  Occupational History   Occupation: Conservator, museum/gallery  Tobacco Use   Smoking status: Former    Current packs/day: 0.00    Average packs/day: 0.5 packs/day for 40.0 years (20.0 ttl pk-yrs)    Types: Cigarettes    Quit date: 1985    Years since quitting: 39.9   Smokeless tobacco: Never   Tobacco comments:    Not much of a smoker, peer pressure mainly  Vaping Use   Vaping status: Never Used  Substance and Sexual Activity   Alcohol use: No   Drug use: No   Sexual activity: Not Currently    Birth control/protection: Post-menopausal, None  Other Topics Concern   Not on file  Social History Narrative   Lives with husband in a one  story home.  No children.        Works as Catering manager for [employer].        Education: college.    Social Drivers of Health   Financial Resource Strain: Medium Risk (01/06/2023)   Overall Financial Resource Strain (CARDIA)    Difficulty of Paying Living Expenses: Somewhat hard  Food Insecurity: No Food Insecurity (01/06/2023)   Hunger Vital Sign    Worried About Running Out of Food in the Last Year: Never true    Ran Out of Food in the Last Year: Never true  Transportation Needs: No Transportation Needs (01/06/2023)   PRAPARE - Administrator, Civil Service (Medical): No    Lack of Transportation (Non-Medical): No  Physical Activity: Insufficiently Active (01/06/2023)   Exercise Vital Sign    Days of Exercise per Week: 1 day    Minutes of Exercise per Session: 20 min  Stress: No Stress Concern Present (01/06/2023)   [employer] of Occupational Health - Occupational Stress Questionnaire    Feeling of Stress : Only a little  Recent Concern: Stress - Stress Concern Present (11/07/2022)   [employer] of Occupational Health - Occupational Stress Questionnaire    Feeling of Stress : To some extent  Social Connections: Moderately Integrated (01/06/2023)   Social Connection and Isolation Panel [NHANES]    Frequency of Communication with Friends and Family: More than three times a week    Frequency of Social Gatherings with Friends and Family: Once a week    Attends Religious Services: More than 4 times per year    Active Member of [employer] or Organizations: Yes    Attends Banker Meetings: More than 4 times per year    Marital Status: Widowed  Recent Concern: Social Connections - Moderately Isolated (12/01/2022)   Social Connection and Isolation Panel [NHANES]    Frequency of Communication with Friends and Family: More than three times a week    Frequency of Social Gatherings with Friends and Family: More than three times a week    Attends  Religious Services: More than 4 times per year    Active Member of [employer] or Organizations: No    Attends Banker Meetings: Never    Marital Status: Widowed  Intimate Partner Violence: Not At Risk (12/01/2022)   Humiliation, Afraid, Rape, and Kick questionnaire    Fear of Current or Ex-Partner: No    Emotionally Abused: No    Physically Abused: No    Sexually Abused: No    Review of Systems: See HPI, otherwise negative ROS  Physical Exam: BP (!) 146/71   Pulse 73   Temp (!) 97 F (36.1 C) (Temporal)   Resp 18   Ht 5\' 6"  (1.676 m)   Wt 81.6 kg   SpO2 95%   BMI 29.05 kg/m  General:   Alert,  pleasant and cooperative in NAD Head:  Normocephalic and atraumatic. Neck:  Supple; no masses or thyromegaly. Lungs:  Clear throughout to auscultation, normal respiratory effort.    Heart:  +S1, +S2, Regular rate and rhythm, No edema. Abdomen:  Soft, nontender and nondistended. Normal bowel sounds, without guarding, and without rebound.   Neurologic:  Alert and  oriented x4;  grossly normal neurologically.  Impression/Plan: Suzanne Strong is here for an endoscopy and colonoscopy  to be performed for  evaluation of iron deficiency anemia    Risks,  benefits, limitations, and alternatives regarding endoscopy have been reviewed with the patient.  Questions have been answered.  All parties agreeable.   [name], MD  02/02/2023, 7:50 AM

## 2023-02-03 ENCOUNTER — Encounter: Payer: Self-pay | Admitting: Gastroenterology

## 2023-02-03 DIAGNOSIS — J690 Pneumonitis due to inhalation of food and vomit: Secondary | ICD-10-CM | POA: Diagnosis not present

## 2023-02-03 LAB — SURGICAL PATHOLOGY

## 2023-02-04 NOTE — Anesthesia Postprocedure Evaluation (Signed)
Anesthesia Post Note  Patient: Suzanne Strong  Procedure(s) Performed: ESOPHAGOGASTRODUODENOSCOPY (EGD) WITH PROPOFOL COLONOSCOPY WITH PROPOFOL BIOPSY  Patient location during evaluation: Endoscopy Anesthesia Type: General Level of consciousness: awake and alert Pain management: pain level controlled Vital Signs Assessment: post-procedure vital signs reviewed and stable Respiratory status: spontaneous breathing, nonlabored ventilation, respiratory function stable and patient connected to nasal cannula oxygen Cardiovascular status: blood pressure returned to baseline and stable Postop Assessment: no apparent nausea or vomiting Anesthetic complications: no   There were no known notable events for this encounter.   Last Vitals:  Vitals:   02/02/23 0904 02/02/23 0914  BP: (!) 146/64 (!) 170/76  Pulse: 72   Resp: (!) 21 17  Temp:    SpO2: 98% 100%    Last Pain:  Vitals:   02/02/23 0914  TempSrc:   PainSc: 0-No pain                 Lenard Simmer

## 2023-02-12 ENCOUNTER — Encounter: Payer: Self-pay | Admitting: Gastroenterology

## 2023-02-18 ENCOUNTER — Ambulatory Visit: Payer: HMO | Admitting: Psychiatry

## 2023-02-25 NOTE — Progress Notes (Deleted)
 Ellouise Console, PA-C [address]  [address]  [place], [place] [address]  Main: [phone]  Fax: [phone]   Primary Care Physician: Tower, Laine LABOR, MD  Primary Gastroenterologist:  ***  CC: Follow-up iron  deficiency anemia, B12 deficiency  HPI: Suzanne Strong is a 78 y.o. female returns for 20-month follow-up of chronic iron  deficiency anemia, and B12 deficiency.  GERD is controlled on omeprazole  and famotidine .  02/02/2023 EGD by Dr. Therisa: Moderate gastritis, otherwise normal.  No erosions.  Biopsies negative for celiac and H. pylori.  02/02/23 Colonoscopy: Sigmoid diverticulosis, 1 small 3 mm tubular adenoma polyp removed from cecum, excellent prep.  No repeat colonoscopy recommended due to advanced age.  12/2022 labs: Negative celiac panel, normal CBC, iron  panel, ferritin, B12.  Low magnesium , yet improved on magnesium  supplement.  Hemoglobin improved to 11.7 on iron  supplement.  Current Outpatient Medications  Medication Sig Dispense Refill   acetaminophen  (TYLENOL ) 325 MG tablet Take 2 tablets (650 mg total) by mouth every 6 (six) hours as needed for mild pain (or Fever >/= 101).     albuterol  (PROVENTIL ) (2.5 MG/3ML) 0.083% nebulizer solution Take 3 mLs (2.5 mg total) by nebulization in the morning and at bedtime. 180 mL 11   albuterol  (VENTOLIN  HFA) 108 (90 Base) MCG/ACT inhaler Inhale 2 puffs into the lungs every 6 (six) hours as needed for wheezing or shortness of breath. 8 g 3   ALPRAZolam  (XANAX ) 0.5 MG tablet Take 1 tablet by mouth twice daily as needed for anxiety 60 tablet 2   amitriptyline  (ELAVIL ) 10 MG tablet Take 1 tablet (10 mg total) by mouth at bedtime. 30 tablet 1   amLODipine  (NORVASC ) 5 MG tablet Take 5 mg by mouth daily.     atorvastatin  (LIPITOR) 10 MG tablet TAKE ONE TABLET BY MOUTH ONCE DAILY 90 tablet 3   b complex vitamins tablet Take 1 tablet by mouth daily.     BIOTIN PO Take by mouth.     busPIRone  (BUSPAR ) 15 MG tablet Take 1.5 tabs  po BID 270 tablet 1   Cholecalciferol  (VITAMIN D -1000 MAX ST) 25 MCG (1000 UT) tablet Take 2,000 Units by mouth daily. Takes three 50 mcg tablets, 3 capsules daily     Emollient (COLLAGEN EX)      famotidine  (PEPCID ) 20 MG tablet Take 1 tablet by mouth twice daily 180 tablet 1   glipiZIDE  (GLUCOTROL ) 5 MG tablet Take 2 tablets (10 mg total) by mouth daily before breakfast. (Patient taking differently: Take 10 mg by mouth 2 (two) times daily before a meal.)     glucose blood (ONETOUCH ULTRA) test strip USE TO check blood glucose ONCE DAILY AND AS NEEDED FOR DIABETES 100 strip 1   losartan  (COZAAR ) 25 MG tablet Take 1 tablet (25 mg total) by mouth daily. 90 tablet 2   magnesium  oxide (MAG-OX) 400 (240 Mg) MG tablet Take 1 tablet (400 mg total) by mouth 2 (two) times daily. (Patient taking differently: Take 400 mg by mouth daily.) 60 tablet 1   metFORMIN  (GLUCOPHAGE ) 500 MG tablet Take 2,000 mg by mouth daily with breakfast.     mirtazapine  (REMERON ) 15 MG tablet Take 1 tablet (15 mg total) by mouth at bedtime.     Multiple Vitamins-Minerals (WOMENS MULTIVITAMIN PO) Take by mouth.     nystatin  (MYCOSTATIN /NYSTOP ) powder Apply 1 Application topically 3 (three) times daily. 30 g 0   nystatin  cream (MYCOSTATIN ) Apply 1 Application topically 2 (two) times daily. 15 g  0   omeprazole  (PRILOSEC) 20 MG capsule TAKE ONE CAPSULE BY MOUTH EVERYDAY AT BEDTIME 90 capsule 3   ondansetron  (ZOFRAN ) 4 MG tablet Take 1 tablet (4 mg total) by mouth daily as needed. 20 tablet 1   rOPINIRole  (REQUIP ) 1 MG tablet TAKE 1/2 TABLET BY MOUTH EVERY MORNING and TAKE THREE TABLETS BY MOUTH EVERYDAY AT BEDTIME 315 tablet 3   sertraline  (ZOLOFT ) 50 MG tablet Take 1/2 tab po qd x 2-4 days, then 1 tab po qd x 1 week, then 2 tabs po qd 60 tablet 1   Sodium Chloride  (HYPERSAL) 3.5 % NEBU Take 1 each by nebulization 2 (two) times daily as needed. 240 mL 5   zinc gluconate 50 MG tablet Take 50 mg by mouth daily.     No current  facility-administered medications for this visit.    Allergies as of 02/26/2023 - Review Complete 02/02/2023  Allergen Reaction Noted   Amoxicillin Swelling 07/18/2007   Penicillin g Anaphylaxis 04/08/2020   Fluconazole Rash    Difuleron [ferrous fumarate-dss]  12/08/2017   Other Other (See Comments) 05/10/2017    Past Medical History:  Diagnosis Date   Allergy 1999   Anemia    iron  deficiency and B12 deficiency   Anxiety    Arthritis 2022   some in my knees   Breast cancer (HCC) 02/25/2015   upper-outer quadrant of left female breast, Triple Negative. Neo-adjuvant chemo with complete pathologic response.    Cataract Floating cataracts 2018   Cervical dysplasia    conization   Chronic kidney disease 2024   questionable   Diabetes mellitus without complication (HCC)    Diverticulosis    Dyspnea    Fever 04/11/2015   Gastroparesis    related to previous radiation tx   GERD (gastroesophageal reflux disease)    Hyperlipidemia    Hypertension    Neuromuscular disorder (HCC)    Neuropathy    legs/feet, S/P chemo meds   Personal history of chemotherapy    Personal history of radiation therapy 2017   LEFT lumpectomy w/ radiation   Pneumonia    RLS (restless legs syndrome)    Seizures (HCC) 2022   Shingles    Hx of   Sleep apnea    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HCC) 2006   Wears dentures    partial bottom    Past Surgical History:  Procedure Laterality Date   APPENDECTOMY     BIOPSY  02/02/2023   Procedure: BIOPSY;  Surgeon: Therisa Bi, MD;  Location: [facility] ENDOSCOPY;  Service: Gastroenterology;;   BREAST BIOPSY Left 02/25/2015   INVASIVE MAMMARY CARCINOMA,Triple negative.   BREAST CYST ASPIRATION     BREAST EXCISIONAL BIOPSY Left 12/2015   surgery   BREAST LUMPECTOMY WITH NEEDLE LOCALIZATION Left 10/02/2015   Procedure: BREAST LUMPECTOMY WITH NEEDLE LOCALIZATION;  Surgeon: Reyes LELON Cota, MD;  Location: [facility] ORS;  Service: General;  Laterality: Left;   BREAST  LUMPECTOMY WITH SENTINEL LYMPH NODE BIOPSY Left 10/02/2015   Procedure: LEFT BREAST WIDE EXCISION WITH SENTINEL LYMPH NODE BX;  Surgeon: Reyes LELON Cota, MD;  Location: [facility] ORS;  Service: General;  Laterality: Left;   BREAST SURGERY     breast biopsy benign   CARPAL TUNNEL RELEASE Right 05/19/2017   Procedure: CARPAL TUNNEL RELEASE;  Surgeon: Cleotilde Barrio, MD;  Location: [facility] ORS;  Service: Orthopedics;  Laterality: Right;   CATARACT EXTRACTION W/PHACO Right 10/26/2018   Procedure: CATARACT EXTRACTION PHACO AND INTRAOCULAR LENS PLACEMENT (IOC) right diabetic;  Surgeon: Mittie,  Dene, MD;  Location: [facility] SURGERY CNTR;  Service: Ophthalmology;  Laterality: Right;  Diabetic - oral meds cancer center been notified and will come   CATARACT EXTRACTION W/PHACO Left 11/16/2018   Procedure: CATARACT EXTRACTION PHACO AND INTRAOCULAR LENS PLACEMENT (IOC) LEFT DIABETIC  01:01.0  17.0%  10.37;  Surgeon: Mittie Dene, MD;  Location: [facility] SURGERY CNTR;  Service: Ophthalmology;  Laterality: Left;  Diabetic - oral meds Port-a-cath   CHOLECYSTECTOMY     Cologuard  07/22/2016   Negative   COLONOSCOPY WITH PROPOFOL  N/A 05/24/2020   Procedure: COLONOSCOPY WITH PROPOFOL ;  Surgeon: Janalyn Keene NOVAK, MD;  Location: [facility] ENDOSCOPY;  Service: Endoscopy;  Laterality: N/A;   COLONOSCOPY WITH PROPOFOL   02/02/2023   Procedure: COLONOSCOPY WITH PROPOFOL ;  Surgeon: Therisa Bi, MD;  Location: [facility] ENDOSCOPY;  Service: Gastroenterology;;   ESOPHAGOGASTRODUODENOSCOPY  07/2009   normal with few gastric polyps   ESOPHAGOGASTRODUODENOSCOPY (EGD) WITH PROPOFOL  N/A 05/24/2020   Procedure: ESOPHAGOGASTRODUODENOSCOPY (EGD) WITH PROPOFOL ;  Surgeon: Janalyn Keene NOVAK, MD;  Location: [facility] ENDOSCOPY;  Service: Endoscopy;  Laterality: N/A;   ESOPHAGOGASTRODUODENOSCOPY (EGD) WITH PROPOFOL  N/A 02/02/2023   Procedure: ESOPHAGOGASTRODUODENOSCOPY (EGD) WITH PROPOFOL ;  Surgeon: Therisa Bi, MD;  Location: [facility]  ENDOSCOPY;  Service: Gastroenterology;  Laterality: N/A;   EYE SURGERY  10/26/2018   FLEXIBLE BRONCHOSCOPY Bilateral 06/24/2022   Procedure: FLEXIBLE BRONCHOSCOPY;  Surgeon: Isadora Hose, MD;  Location: [facility] ORS;  Service: Pulmonary;  Laterality: Bilateral;   FRACTURE SURGERY  1950 ish   broke my left arm as a child   MOLE REMOVAL     PORT-A-CATH REMOVAL     PORTACATH PLACEMENT     PORTACATH PLACEMENT Right 03/12/2015   Procedure: INSERTION PORT-A-CATH;  Surgeon: Reyes LELON Cota, MD;  Location: [facility] ORS;  Service: General;  Laterality: Right;   RADICAL NECK DISSECTION     TONSILLECTOMY Left    cancer treated with chemo and radiation   TRIGGER FINGER RELEASE Right 05/19/2017   Procedure: RELEASE TRIGGER FINGER/A-1 PULLEY ring finger;  Surgeon: Cleotilde Barrio, MD;  Location: [facility] ORS;  Service: Orthopedics;  Laterality: Right;   VIDEO BRONCHOSCOPY WITH ENDOBRONCHIAL ULTRASOUND Bilateral 06/24/2022   Procedure: VIDEO BRONCHOSCOPY WITH ENDOBRONCHIAL ULTRASOUND;  Surgeon: Isadora Hose, MD;  Location: [facility] ORS;  Service: Pulmonary;  Laterality: Bilateral;    Review of Systems:    All systems reviewed and negative except where noted in HPI.   Physical Examination:   There were no vitals taken for this visit.  General: Well-nourished, well-developed in no acute distress.  Lungs: Clear to auscultation bilaterally. Non-labored. Heart: Regular rate and rhythm, no murmurs rubs or gallops.  Abdomen: Bowel sounds are normal; Abdomen is Soft; No hepatosplenomegaly, masses or hernias;  No Abdominal Tenderness; No guarding or rebound tenderness. Neuro: Alert and oriented x 3.  Grossly intact.  Psych: Alert and cooperative, normal mood and affect.   Imaging Studies: No results found.  Assessment and Plan:   Suzanne Strong is a 78 y.o. y/o female returns for follow-up of chronic iron  deficiency add B12 deficiency anemia.  Recent EGD and colonoscopy unrevealing.  Biopsies negative for  celiac and H. pylori.  Mild gastritis.  No further repeat colonoscopies recommended due to advanced age.  1.  Chronic iron  deficiency anemia  Improved on oral iron   Capsule endoscopy?  Monitor labs through PCP in the future  2.  B12 deficiency  Improved on B12  3.  Hypomagnesemia  Improved on magnesium   4.  Gastritis (biopsies negative for H. pylori)/GERD  Continue omeprazole  20 Mg daily  Continue famotidine   Ellouise Console, PA-C  Follow up as needed if recurrent GI symptoms  BP check ***

## 2023-02-26 ENCOUNTER — Ambulatory Visit: Payer: HMO | Admitting: Physician Assistant

## 2023-03-05 ENCOUNTER — Other Ambulatory Visit: Payer: Self-pay | Admitting: Family Medicine

## 2023-03-06 DIAGNOSIS — J690 Pneumonitis due to inhalation of food and vomit: Secondary | ICD-10-CM | POA: Diagnosis not present

## 2023-03-18 ENCOUNTER — Encounter: Payer: Self-pay | Admitting: Family Medicine

## 2023-03-29 ENCOUNTER — Other Ambulatory Visit: Payer: Self-pay

## 2023-03-30 NOTE — Progress Notes (Unsigned)
Celso Amy, PA-C [address]  [address]  [place], [place] [address]  Main: [phone]  Fax: [phone]   Primary Care Physician: Tower, [name], MD  Primary Gastroenterologist:  Celso Amy, PA-C / Dr. [name]    CC: F/U chronic iron deficiency anemia and B12 deficiency  HPI: Suzanne Strong is a 78 y.o. female returns for 36-month follow-up of iron deficiency and B12 deficiency anemia.  Last IV iron infusion was 11/2022.  Currently takes B complex vitamin.   02/02/2023 EGD by Dr. Tobi Bastos: Moderate gastritis.  Normal esophagus and duodenum.  Biopsies negative for celiac and H. pylori.  Biopsy showed chronic gastritis.  02/02/2023 colonoscopy: 1 small 3 mm tubular adenoma cecal polyp removed.  Sigmoid diverticulosis.  Excellent prep.  No repeat due to advanced age.  12/29/2022 Last Labs: Hemoglobin 10.8, MCV 85, BUN 15, CR 0.94, GFR > 60, normal electrolytes.  Normal Improved Iron 80.  Normal B12 of 310.  Celiac labs negative.  Low Magnesium 1.4. 11/17/2022: Low Hemoglobin 7.4, iron 18, iron saturation 5%, ferritin 25   Currently on omeprazole 20 Mg daily and famotidine 20 Mg daily with good control of GERD.  She has oropharyngeal dysphagia due to previous neck surgery from tonsil cancer.  History of diabetes and gastroparesis.  She denies any GI symptoms such as abdominal pain, melena, hematochezia, or weight loss.  Denies NSAID use.  Occasionally takes Tylenol prn.   05/2020 EGD: Normal esophagus, multiple benign gastric polyps, mild chronic gastritis, normal duodenum.  Biopsies negative for H. pylori and EOE.   05/2020 Colonoscopy: 7 mm tubular adenoma polyp removed from ascending colon.  Poor prep.  Repeat in 6 months with 2-day prep.  Current Outpatient Medications  Medication Sig Dispense Refill   acetaminophen (TYLENOL) 325 MG tablet Take 2 tablets (650 mg total) by mouth every 6 (six) hours as needed for mild pain (or Fever >/= 101).     albuterol (PROVENTIL)  (2.5 MG/3ML) 0.083% nebulizer solution Take 3 mLs (2.5 mg total) by nebulization in the morning and at bedtime. 180 mL 11   albuterol (VENTOLIN HFA) 108 (90 Base) MCG/ACT inhaler Inhale 2 puffs into the lungs every 6 (six) hours as needed for wheezing or shortness of breath. 8 g 3   ALPRAZolam (XANAX) 0.5 MG tablet Take 1 tablet by mouth twice daily as needed for anxiety 60 tablet 2   amitriptyline (ELAVIL) 10 MG tablet TAKE 1 TABLET BY MOUTH AT BEDTIME 90 tablet 1   amLODipine (NORVASC) 5 MG tablet Take 5 mg by mouth daily.     atorvastatin (LIPITOR) 10 MG tablet TAKE ONE TABLET BY MOUTH ONCE DAILY 90 tablet 3   b complex vitamins tablet Take 1 tablet by mouth daily.     BIOTIN PO Take by mouth.     busPIRone (BUSPAR) 15 MG tablet Take 1.5 tabs po BID 270 tablet 1   Cholecalciferol (VITAMIN D-1000 MAX ST) 25 MCG (1000 UT) tablet Take 2,000 Units by mouth daily. Takes three 50 mcg tablets, 3 capsules daily     Emollient (COLLAGEN EX)      famotidine (PEPCID) 20 MG tablet Take 1 tablet by mouth twice daily 180 tablet 1   glipiZIDE (GLUCOTROL) 5 MG tablet Take 2 tablets (10 mg total) by mouth daily before breakfast. (Patient taking differently: Take 10 mg by mouth 2 (two) times daily before a meal.)     glucose blood (ONETOUCH ULTRA) test strip USE TO check blood glucose  ONCE DAILY AND AS NEEDED FOR DIABETES 100 strip 1   losartan (COZAAR) 25 MG tablet Take 1 tablet (25 mg total) by mouth daily. 90 tablet 2   magnesium oxide (MAG-OX) 400 (240 Mg) MG tablet Take 1 tablet (400 mg total) by mouth 2 (two) times daily. (Patient taking differently: Take 400 mg by mouth daily.) 60 tablet 1   metFORMIN (GLUCOPHAGE) 500 MG tablet Take 2,000 mg by mouth daily with breakfast.     mirtazapine (REMERON) 15 MG tablet Take 1 tablet (15 mg total) by mouth at bedtime.     Multiple Vitamins-Minerals (WOMENS MULTIVITAMIN PO) Take by mouth.     nystatin (MYCOSTATIN/NYSTOP) powder Apply 1 Application topically 3 (three)  times daily. 30 g 0   nystatin cream (MYCOSTATIN) Apply 1 Application topically 2 (two) times daily. 15 g 0   omeprazole (PRILOSEC) 20 MG capsule TAKE ONE CAPSULE BY MOUTH EVERYDAY AT BEDTIME 90 capsule 3   ondansetron (ZOFRAN) 4 MG tablet Take 1 tablet (4 mg total) by mouth daily as needed. 20 tablet 1   rOPINIRole (REQUIP) 1 MG tablet TAKE 1/2 TABLET BY MOUTH EVERY MORNING and TAKE THREE TABLETS BY MOUTH EVERYDAY AT BEDTIME 315 tablet 3   sertraline (ZOLOFT) 50 MG tablet Take 1/2 tab po qd x 2-4 days, then 1 tab po qd x 1 week, then 2 tabs po qd 60 tablet 1   Sodium Chloride (HYPERSAL) 3.5 % NEBU Take 1 each by nebulization 2 (two) times daily as needed. 240 mL 5   zinc gluconate 50 MG tablet Take 50 mg by mouth daily.     No current facility-administered medications for this visit.    Allergies as of 03/31/2023 - Review Complete 03/31/2023  Allergen Reaction Noted   Amoxicillin Swelling 07/18/2007   Penicillin g Anaphylaxis 04/08/2020   Fluconazole Rash    Difuleron [ferrous fumarate-dss]  12/08/2017   Other Other (See Comments) 05/10/2017    Past Medical History:  Diagnosis Date   Allergy 1999   Anemia    iron deficiency and B12 deficiency   Anxiety    Arthritis 2022   some in my knees   Breast cancer (HCC) 02/25/2015   upper-outer quadrant of left female breast, Triple Negative. Neo-adjuvant chemo with complete pathologic response.    Cataract Floating cataracts 2018   Cervical dysplasia    conization   Chronic kidney disease 2024   questionable   Diabetes mellitus without complication (HCC)    Diverticulosis    Dyspnea    Fever 04/11/2015   Gastroparesis    related to previous radiation tx   GERD (gastroesophageal reflux disease)    Hyperlipidemia    Hypertension    Neuromuscular disorder (HCC)    Neuropathy    legs/feet, S/P chemo meds   Personal history of chemotherapy    Personal history of radiation therapy 2017   LEFT lumpectomy w/ radiation   Pneumonia     RLS (restless legs syndrome)    Seizures (HCC) 2022   Shingles    Hx of   Sleep apnea    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HCC) 2006   Wears dentures    partial bottom    Past Surgical History:  Procedure Laterality Date   APPENDECTOMY     BIOPSY  02/02/2023   Procedure: BIOPSY;  Surgeon: [name], MD;  Location: [facility] ENDOSCOPY;  Service: Gastroenterology;;   BREAST BIOPSY Left 02/25/2015   INVASIVE MAMMARY CARCINOMA,Triple negative.   BREAST CYST ASPIRATION  BREAST EXCISIONAL BIOPSY Left 12/2015   surgery   BREAST LUMPECTOMY WITH NEEDLE LOCALIZATION Left 10/02/2015   Procedure: BREAST LUMPECTOMY WITH NEEDLE LOCALIZATION;  Surgeon: Earline Mayotte, MD;  Location: [facility] ORS;  Service: General;  Laterality: Left;   BREAST LUMPECTOMY WITH SENTINEL LYMPH NODE BIOPSY Left 10/02/2015   Procedure: LEFT BREAST WIDE EXCISION WITH SENTINEL LYMPH NODE BX;  Surgeon: Earline Mayotte, MD;  Location: [facility] ORS;  Service: General;  Laterality: Left;   BREAST SURGERY     breast biopsy benign   CARPAL TUNNEL RELEASE Right 05/19/2017   Procedure: CARPAL TUNNEL RELEASE;  Surgeon: Deeann Saint, MD;  Location: [facility] ORS;  Service: Orthopedics;  Laterality: Right;   CATARACT EXTRACTION W/PHACO Right 10/26/2018   Procedure: CATARACT EXTRACTION PHACO AND INTRAOCULAR LENS PLACEMENT (IOC) right diabetic;  Surgeon: Lockie Mola, MD;  Location: [facility] SURGERY CNTR;  Service: Ophthalmology;  Laterality: Right;  Diabetic - oral meds cancer center been notified and will come   CATARACT EXTRACTION W/PHACO Left 11/16/2018   Procedure: CATARACT EXTRACTION PHACO AND INTRAOCULAR LENS PLACEMENT (IOC) LEFT DIABETIC  01:01.0  17.0%  10.37;  Surgeon: Lockie Mola, MD;  Location: [facility] SURGERY CNTR;  Service: Ophthalmology;  Laterality: Left;  Diabetic - oral meds Port-a-cath   CHOLECYSTECTOMY     Cologuard  07/22/2016   Negative   COLONOSCOPY WITH PROPOFOL N/A 05/24/2020   Procedure: COLONOSCOPY WITH  PROPOFOL;  Surgeon: Pasty Spillers, MD;  Location: [facility] ENDOSCOPY;  Service: Endoscopy;  Laterality: N/A;   COLONOSCOPY WITH PROPOFOL  02/02/2023   Procedure: COLONOSCOPY WITH PROPOFOL;  Surgeon: [name], MD;  Location[facility] ENDOSCOPY;  Service: Gastroenterology;;   ESOPHAGOGASTRODUODENOSCOPY  07/2009   normal with few gastric polyps   ESOPHAGOGASTRODUODENOSCOPY (EGD) WITH PROPOFOL N/A 05/24/2020   Procedure: ESOPHAGOGASTRODUODENOSCOPY (EGD) WITH PROPOFOL;  Surgeon: Pasty Spillers, MD;  Location: [facility] ENDOSCOPY;  Service: Endoscopy;  Laterality: N/A;   ESOPHAGOGASTRODUODENOSCOPY (EGD) WITH PROPOFOL N/A 02/02/2023   Procedure: ESOPHAGOGASTRODUODENOSCOPY (EGD) WITH PROPOFOL;  Surgeon: [name], MD;  Location: [facility] ENDOSCOPY;  Service: Gastroenterology;  Laterality: N/A;   EYE SURGERY  10/26/2018   FLEXIBLE BRONCHOSCOPY Bilateral 06/24/2022   Procedure: FLEXIBLE BRONCHOSCOPY;  Surgeon: [name], MD;  Location: [facility] ORS;  Service: Pulmonary;  Laterality: Bilateral;   FRACTURE SURGERY  1950 ish   broke my left arm as a child   MOLE REMOVAL     PORT-A-CATH REMOVAL     PORTACATH PLACEMENT     PORTACATH PLACEMENT Right 03/12/2015   Procedure: INSERTION PORT-A-CATH;  Surgeon: Earline Mayotte, MD;  Location: [facility] ORS;  Service: General;  Laterality: Right;   RADICAL NECK DISSECTION     TONSILLECTOMY Left    cancer treated with chemo and radiation   TRIGGER FINGER RELEASE Right 05/19/2017   Procedure: RELEASE TRIGGER FINGER/A-1 PULLEY ring finger;  Surgeon: Deeann Saint, MD;  Location: [facility] ORS;  Service: Orthopedics;  Laterality: Right;   VIDEO BRONCHOSCOPY WITH ENDOBRONCHIAL ULTRASOUND Bilateral 06/24/2022   Procedure: VIDEO BRONCHOSCOPY WITH ENDOBRONCHIAL ULTRASOUND;  Surgeon: [name], MD;  Location: [facility] ORS;  Service: Pulmonary;  Laterality: Bilateral;    Review of Systems:    All systems reviewed and negative except where noted in HPI.   Physical  Examination:   BP (!) 104/58   Pulse 86   Temp 97.7 F (36.5 C)   Ht 5\' 6"  (1.676 m)   Wt 187 lb 9.6 oz (85.1 kg)   BMI 30.28 kg/m   General: Well-nourished, well-developed in no  acute distress.  Neuro: Alert and oriented x 3.  Grossly intact.  Psych: Alert and cooperative, normal mood and affect.   Imaging Studies: No results found.  Assessment and Plan:   Suzanne Strong is a 78 y.o. y/o female returns for follow-up of chronic iron deficiency anemia.  Recent EGD showed moderate gastritis.  Biopsies negative for celiac and H. pylori.  Colonoscopy showed 1 small polyp removed.  Otherwise unrevealing.  Scheduling capsule endoscopy to evaluate small bowel to complete her GI workup.  1.  Chronic gastritis - H. Pylori Negative; Asymptomatic.  Continue omeprazole 20 Mg daily and famotidine 20 Mg daily.  2.  Iron deficiency anemia  Labs: CBC, iron panel, ferritin  Schedule capsule endoscopy  If Iron is low, then refer back to hematology for IV Iron.  3.  B12 deficiency  Lab: vitamin B12  Continue B complex vitamin  4.  History of colon polyps  Recent colonoscopy showed one small 3 mm small adenomatous polyp removed.  No further colonoscopies are recommended due to advanced age.  Celso Amy, PA-C  Follow up As needed based on Test Results and GI symptoms.  Followup with PCP in the future to monitor labs.

## 2023-03-31 ENCOUNTER — Encounter: Payer: Self-pay | Admitting: Physician Assistant

## 2023-03-31 ENCOUNTER — Ambulatory Visit: Payer: HMO | Admitting: Physician Assistant

## 2023-03-31 ENCOUNTER — Other Ambulatory Visit: Payer: Self-pay

## 2023-03-31 VITALS — BP 104/58 | HR 86 | Temp 97.7°F | Ht 66.0 in | Wt 187.6 lb

## 2023-03-31 DIAGNOSIS — E538 Deficiency of other specified B group vitamins: Secondary | ICD-10-CM | POA: Diagnosis not present

## 2023-03-31 DIAGNOSIS — K2950 Unspecified chronic gastritis without bleeding: Secondary | ICD-10-CM

## 2023-03-31 DIAGNOSIS — Z860101 Personal history of adenomatous and serrated colon polyps: Secondary | ICD-10-CM | POA: Diagnosis not present

## 2023-03-31 DIAGNOSIS — D509 Iron deficiency anemia, unspecified: Secondary | ICD-10-CM | POA: Diagnosis not present

## 2023-04-01 ENCOUNTER — Encounter: Payer: Self-pay | Admitting: Physician Assistant

## 2023-04-01 LAB — CBC
Hematocrit: 36.9 % (ref 34.0–46.6)
Hemoglobin: 11.8 g/dL (ref 11.1–15.9)
MCH: 29.4 pg (ref 26.6–33.0)
MCHC: 32 g/dL (ref 31.5–35.7)
MCV: 92 fL (ref 79–97)
Platelets: 335 10*3/uL (ref 150–450)
RBC: 4.01 x10E6/uL (ref 3.77–5.28)
RDW: 13.7 % (ref 11.7–15.4)
WBC: 6.9 10*3/uL (ref 3.4–10.8)

## 2023-04-01 LAB — VITAMIN B12: Vitamin B-12: 265 pg/mL (ref 232–1245)

## 2023-04-01 LAB — IRON,TIBC AND FERRITIN PANEL
Ferritin: 179 ng/mL — ABNORMAL HIGH (ref 15–150)
Iron Saturation: 31 % (ref 15–55)
Iron: 116 ug/dL (ref 27–139)
Total Iron Binding Capacity: 370 ug/dL (ref 250–450)
UIBC: 254 ug/dL (ref 118–369)

## 2023-04-01 LAB — MAGNESIUM: Magnesium: 1.5 mg/dL — ABNORMAL LOW (ref 1.6–2.3)

## 2023-04-02 ENCOUNTER — Encounter: Payer: Self-pay | Admitting: Physician Assistant

## 2023-04-02 ENCOUNTER — Ambulatory Visit: Payer: HMO | Admitting: Physician Assistant

## 2023-04-02 DIAGNOSIS — F411 Generalized anxiety disorder: Secondary | ICD-10-CM

## 2023-04-02 DIAGNOSIS — G4700 Insomnia, unspecified: Secondary | ICD-10-CM | POA: Diagnosis not present

## 2023-04-02 DIAGNOSIS — F32A Depression, unspecified: Secondary | ICD-10-CM

## 2023-04-02 MED ORDER — SERTRALINE HCL 50 MG PO TABS
ORAL_TABLET | ORAL | 1 refills | Status: DC
Start: 2023-04-02 — End: 2023-05-26

## 2023-04-02 NOTE — Progress Notes (Signed)
[facility]  Patient ID: Suzanne Strong,  MRN: [id number]  PCP: Judy Pimple, MD  Date of Evaluation: 04/02/2023 Time spent:30 minutes  Chief Complaint:  Chief Complaint   Anxiety; Insomnia; Follow-up    HISTORY/CURRENT STATUS: HPI transferring to my care from Corie Chiquito, NP he was no longer with practice  [name] states she is doing well for the most part.  She is off the Zoloft, ran out and did not get a refill.  She does think it helped some, take the edge off and keep her from getting so overwhelmed.  She would like to restart it.  She does not have much anxiety during the day, but will have ruminating thoughts at night sometimes and has a hard time falling asleep and staying asleep.  She will think about such and such and if she does not get up and take care of it then she continues to ruminate about it.  This has been an issue for most of her life.  The Xanax helps her rest.  She has tried many other things over the years and they were not effective or they caused side effects that were not tolerable.  She has had some falls in the past but none in at least 6 months.  Not having a lot of anxiety.  Patient is able to enjoy things.  She is an avid reader, likes other literature, she also cross-stitch is, volunteers at her church, and when the weather is nice she likes to work in the yard.  Energy and motivation are good.  No extreme sadness, tearfulness, or feelings of hopelessness.  ADLs and personal hygiene are normal.   Denies any changes in concentration, making decisions, or remembering things.  Appetite has not changed.  Weight is stable.   Denies suicidal or homicidal thoughts.  Patient denies increased energy with decreased need for sleep, increased talkativeness, racing thoughts, impulsivity or risky behaviors, increased spending, increased libido, grandiosity, increased irritability or anger, paranoia, or hallucinations.  Review of Systems  Constitutional:  Negative.   HENT: Negative.    Eyes: Negative.   Respiratory: Negative.    Cardiovascular: Negative.   Gastrointestinal: Negative.   Genitourinary: Negative.   Musculoskeletal: Negative.   Skin: Negative.   Neurological: Negative.   Endo/Heme/Allergies: Negative.   Psychiatric/Behavioral:         See HPI   Individual Medical History/ Review of Systems: Changes? :No   Past Psychiatric Medication Trials: Remeron wasn't effective Xanax Gabapentin- Unsure if this is helpful for RLS Lyrica Ropinorole- Helpful for RLS Zoloft Buspar Lyrica Trazodone-Ineffective. Caused unsteadiness.  Amitriptyline-Helps with sleep initiation. Does not notice benefit for pain.  Belsomra-Effective.  Temazepam-caused vivid dreams. Helped with sleep initiation and did not help with sleep maintenance.   Allergies: Amoxicillin, Penicillin g, Fluconazole, Difuleron [ferrous fumarate-dss], and Other  Current Medications:  Current Outpatient Medications:    acetaminophen (TYLENOL) 325 MG tablet, Take 2 tablets (650 mg total) by mouth every 6 (six) hours as needed for mild pain (or Fever >/= 101)., Disp: , Rfl:    albuterol (PROVENTIL) (2.5 MG/3ML) 0.083% nebulizer solution, Take 3 mLs (2.5 mg total) by nebulization in the morning and at bedtime., Disp: 180 mL, Rfl: 11   albuterol (VENTOLIN HFA) 108 (90 Base) MCG/ACT inhaler, Inhale 2 puffs into the lungs every 6 (six) hours as needed for wheezing or shortness of breath., Disp: 8 g, Rfl: 3   ALPRAZolam (XANAX) 0.5 MG tablet, Take 1 tablet by mouth twice daily  as needed for anxiety, Disp: 60 tablet, Rfl: 2   amitriptyline (ELAVIL) 10 MG tablet, TAKE 1 TABLET BY MOUTH AT BEDTIME, Disp: 90 tablet, Rfl: 1   amLODipine (NORVASC) 5 MG tablet, Take 5 mg by mouth daily., Disp: , Rfl:    atorvastatin (LIPITOR) 10 MG tablet, TAKE ONE TABLET BY MOUTH ONCE DAILY, Disp: 90 tablet, Rfl: 3   b complex vitamins tablet, Take 1 tablet by mouth daily., Disp: , Rfl:    BIOTIN  PO, Take by mouth., Disp: , Rfl:    busPIRone (BUSPAR) 15 MG tablet, Take 1.5 tabs po BID, Disp: 270 tablet, Rfl: 1   Cholecalciferol (VITAMIN D-1000 MAX ST) 25 MCG (1000 UT) tablet, Take 2,000 Units by mouth daily. Takes three 50 mcg tablets, 3 capsules daily, Disp: , Rfl:    Emollient (COLLAGEN EX), , Disp: , Rfl:    famotidine (PEPCID) 20 MG tablet, Take 1 tablet by mouth twice daily, Disp: 180 tablet, Rfl: 1   Ferrous Gluconate (IRON 27 PO), Take by mouth., Disp: , Rfl:    glipiZIDE (GLUCOTROL) 5 MG tablet, Take 2 tablets (10 mg total) by mouth daily before breakfast. (Patient taking differently: Take 10 mg by mouth 2 (two) times daily before a meal.), Disp: , Rfl:    glucose blood (ONETOUCH ULTRA) test strip, USE TO check blood glucose ONCE DAILY AND AS NEEDED FOR DIABETES, Disp: 100 strip, Rfl: 1   losartan (COZAAR) 25 MG tablet, Take 1 tablet (25 mg total) by mouth daily., Disp: 90 tablet, Rfl: 2   magnesium oxide (MAG-OX) 400 (240 Mg) MG tablet, Take 1 tablet (400 mg total) by mouth 2 (two) times daily. (Patient taking differently: Take 400 mg by mouth daily.), Disp: 60 tablet, Rfl: 1   metFORMIN (GLUCOPHAGE) 500 MG tablet, Take 2,000 mg by mouth daily with breakfast., Disp: , Rfl:    Multiple Vitamins-Minerals (WOMENS MULTIVITAMIN PO), Take by mouth., Disp: , Rfl:    nystatin (MYCOSTATIN/NYSTOP) powder, Apply 1 Application topically 3 (three) times daily., Disp: 30 g, Rfl: 0   nystatin cream (MYCOSTATIN), Apply 1 Application topically 2 (two) times daily., Disp: 15 g, Rfl: 0   omeprazole (PRILOSEC) 20 MG capsule, TAKE ONE CAPSULE BY MOUTH EVERYDAY AT BEDTIME, Disp: 90 capsule, Rfl: 3   ondansetron (ZOFRAN) 4 MG tablet, Take 1 tablet (4 mg total) by mouth daily as needed., Disp: 20 tablet, Rfl: 1   rOPINIRole (REQUIP) 1 MG tablet, TAKE 1/2 TABLET BY MOUTH EVERY MORNING and TAKE THREE TABLETS BY MOUTH EVERYDAY AT BEDTIME, Disp: 315 tablet, Rfl: 3   Sodium Chloride (HYPERSAL) 3.5 % NEBU, Take  1 each by nebulization 2 (two) times daily as needed., Disp: 240 mL, Rfl: 5   zinc gluconate 50 MG tablet, Take 50 mg by mouth daily., Disp: , Rfl:    sertraline (ZOLOFT) 50 MG tablet, Take 1/2 tab po qd x 2-4 days, then 1 tab po qd x 1 week, then 2 tabs po qd, Disp: 60 tablet, Rfl: 1 Medication Side Effects: none  Family Medical/ Social History: Changes? No  MENTAL HEALTH EXAM:  There were no vitals taken for this visit.There is no height or weight on file to calculate BMI.  General Appearance: Casual and Well Groomed  Eye Contact:  Good  Speech:  Clear and Coherent and Normal Rate  Volume:  Normal  Mood:  Euthymic  Affect:  Congruent  Thought Process:  Goal Directed and Descriptions of Associations: Circumstantial  Orientation:  Full (Time, Place,  and Person)  Thought Content: Logical   Suicidal Thoughts:  No  Homicidal Thoughts:  No  Memory:  WNL  Judgement:  Good  Insight:  Good  Psychomotor Activity:  Normal  Concentration:  Concentration: Good  Recall:  Good  Fund of Knowledge: Good  Language: Good  Assets:  Communication Skills Desire for Improvement Financial Resources/Insurance Housing Transportation  ADL's:  Intact  Cognition: WNL  Prognosis:  Good   DIAGNOSES:    ICD-10-CM   1. Mild depression  F32.A     2. Generalized anxiety disorder  F41.1 sertraline (ZOLOFT) 50 MG tablet    3. Insomnia, unspecified type  G47.00       Receiving Psychotherapy: No   RECOMMENDATIONS:   PDMP reviewed.  Xanax filled 03/15/2023.  Temazepam filled 09/14/2022. I provided 30 minutes of face to face time during this encounter, including time spent before and after the visit in records review, medical decision making, counseling pertinent to today's visit, and charting.   Jmya is doing well for the most part but would like to get back on the Zoloft.  She felt like she was not quite as overwhelmed when she did take it.  She has been on Xanax for many years and it is effective  for sleep.  She understands the risk of confusion and possible imbalance that can lead to falls and then sequela of that.  She accepts those risks.  We discussed that insomnia and sleep deprivation can also cause imbalance and confusion depending on how sleep deprived 1 can become.  We also talked about sleep hygiene.  And the amitriptyline, since that is a low dose and were starting out with a low dose of Zoloft, and it is okay to take them together.  Continue Xanax 0.5 mg, 1 p.o. twice daily as needed anxiety or sleep. Continue amitriptyline 10 mg, 1 p.o. nightly. Continue BuSpar 15 mg, 1.5 pills twice daily. Continue ropinirole 1 mg, one half p.o. every morning and 3 p.o. nightly by another provider. Restart Zoloft 50 mg, one half daily for 2 to 4 days then 1 p.o. daily for 1 week then increase to 2 p.o. daily. Continue vitamins and supplements as per med list. Return in 6 weeks.  Melony Overly, PA-C

## 2023-04-06 ENCOUNTER — Telehealth: Payer: Self-pay | Admitting: Physician Assistant

## 2023-04-06 ENCOUNTER — Telehealth: Payer: Self-pay

## 2023-04-06 DIAGNOSIS — J690 Pneumonitis due to inhalation of food and vomit: Secondary | ICD-10-CM | POA: Diagnosis not present

## 2023-04-06 NOTE — Telephone Encounter (Signed)
Left message to call office.   Hi Suzanne Strong, your labs show: 1.  Iron has improved to normal.  Hemoglobin has also improved and is in normal range.  No need for iron infusion at this time. 2.  Vitamin B12 is at the low end of normal.  Continue vitamin B12, please take B12 supplement daily. 3.  Magnesium has improved, yet still a little low.  Continue magnesium supplement daily. 4.  Continue with plan for capsule endoscopy.  Follow-up with PCP to monitor labs in the future. Celso Amy, PA-C

## 2023-04-06 NOTE — Telephone Encounter (Signed)
The patient left a voicemail to reschedule her capsule test. I called the patient back to inform that we received her message, and the patient said that she already spoke to [place].

## 2023-04-07 NOTE — Telephone Encounter (Signed)
Patient notified.    Hi Ms. Lopata, your labs show: 1.  Iron has improved to normal.  Hemoglobin has also improved and is in normal range.  No need for iron infusion at this time. 2.  Vitamin B12 is at the low end of normal.  Continue vitamin B12, please take B12 supplement daily. 3.  Magnesium has improved, yet still a little low.  Continue magnesium supplement daily. 4.  Continue with plan for capsule endoscopy.  Follow-up with PCP to monitor labs in the future. Celso Amy, PA-C

## 2023-04-21 ENCOUNTER — Other Ambulatory Visit: Payer: Self-pay | Admitting: Family Medicine

## 2023-05-03 ENCOUNTER — Other Ambulatory Visit: Payer: Self-pay | Admitting: Family Medicine

## 2023-05-04 ENCOUNTER — Ambulatory Visit
Admission: RE | Admit: 2023-05-04 | Discharge: 2023-05-04 | Disposition: A | Discharge status: Home / Self Care | Payer: HMO | Attending: Gastroenterology | Admitting: Gastroenterology

## 2023-05-04 ENCOUNTER — Encounter: Payer: Self-pay | Admitting: Gastroenterology

## 2023-05-04 ENCOUNTER — Encounter
Admission: RE | Disposition: A | Discharge status: Home / Self Care | Payer: Self-pay | Source: Home / Self Care | Attending: Gastroenterology

## 2023-05-04 DIAGNOSIS — D509 Iron deficiency anemia, unspecified: Secondary | ICD-10-CM | POA: Insufficient documentation

## 2023-05-04 DIAGNOSIS — J690 Pneumonitis due to inhalation of food and vomit: Secondary | ICD-10-CM | POA: Diagnosis not present

## 2023-05-04 HISTORY — PX: GIVENS CAPSULE STUDY: SHX5432

## 2023-05-04 SURGERY — IMAGING PROCEDURE, GI TRACT, INTRALUMINAL, VIA CAPSULE

## 2023-05-04 NOTE — OR Nursing (Signed)
 Multiple failed attempts to swallow given capsule; Dr Tobi Bastos spoke with pt; will reschedule via EGD

## 2023-05-04 NOTE — OR Nursing (Signed)
 Notified office regarding Suzanne Strong unsuccessful to swallow capsule.

## 2023-05-04 NOTE — Telephone Encounter (Signed)
 Both refilled on 03/10/22 # 3 month supply with 3 refills   Last OV was 01/06/23

## 2023-05-09 ENCOUNTER — Other Ambulatory Visit: Payer: Self-pay | Admitting: Family Medicine

## 2023-05-20 DIAGNOSIS — E119 Type 2 diabetes mellitus without complications: Secondary | ICD-10-CM | POA: Diagnosis not present

## 2023-05-20 DIAGNOSIS — Z961 Presence of intraocular lens: Secondary | ICD-10-CM | POA: Diagnosis not present

## 2023-05-20 DIAGNOSIS — H5022 Vertical strabismus, left eye: Secondary | ICD-10-CM | POA: Diagnosis not present

## 2023-05-20 DIAGNOSIS — H43813 Vitreous degeneration, bilateral: Secondary | ICD-10-CM | POA: Diagnosis not present

## 2023-05-20 LAB — HM DIABETES EYE EXAM

## 2023-05-26 ENCOUNTER — Encounter: Payer: Self-pay | Admitting: Physician Assistant

## 2023-05-26 ENCOUNTER — Ambulatory Visit: Payer: HMO | Admitting: Physician Assistant

## 2023-05-26 DIAGNOSIS — G4700 Insomnia, unspecified: Secondary | ICD-10-CM

## 2023-05-26 DIAGNOSIS — F32A Depression, unspecified: Secondary | ICD-10-CM | POA: Diagnosis not present

## 2023-05-26 DIAGNOSIS — F411 Generalized anxiety disorder: Secondary | ICD-10-CM

## 2023-05-26 MED ORDER — SERTRALINE HCL 100 MG PO TABS
100.0000 mg | ORAL_TABLET | Freq: Every day | ORAL | 1 refills | Status: DC
Start: 2023-05-26 — End: 2024-01-15

## 2023-05-26 MED ORDER — AMITRIPTYLINE HCL 10 MG PO TABS: 20.0000 mg | ORAL_TABLET | Freq: Every evening | ORAL | 1 refills | Status: DC | PRN

## 2023-05-26 NOTE — Progress Notes (Signed)
 [facility]  Patient ID: Suzanne Strong,  MRN: [id number]  PCP: Clemens Curt, MD  Date of Evaluation: 05/31/2023 Time spent:25 minutes  Chief Complaint:  Chief Complaint   Anxiety; Depression; Insomnia; Follow-up    HISTORY/CURRENT STATUS: HPI  for 6 week med check  She feels better since restarting Zoloft.  She is not as overwhelmed.  The anxiety is better controlled.  It is not much of a problem during the day but in the evening she sometimes has ruminating thoughts so that she cannot go to sleep or stay asleep.  That has not improved since being back on the Zoloft.  No panic attacks.  The amitriptyline is not helping with sleep as much as it has in the past.  Patient is able to enjoy things.  Energy and motivation are good.  No extreme sadness, tearfulness, or feelings of hopelessness.   ADLs and personal hygiene are normal.   Denies any changes in concentration, making decisions, or remembering things.  Appetite has not changed.  Weight is stable.   Denies suicidal or homicidal thoughts.  Individual Medical History/ Review of Systems: Changes? :No   Past Psychiatric Medication Trials: Remeron wasn't effective Xanax Gabapentin- Unsure if this is helpful for RLS Lyrica Ropinorole- Helpful for RLS Zoloft Buspar Lyrica Trazodone-Ineffective. Caused unsteadiness.  Amitriptyline-Helps with sleep initiation. Does not notice benefit for pain.  Belsomra-Effective.  Temazepam-caused vivid dreams. Helped with sleep initiation and did not help with sleep maintenance.   Allergies: Amoxicillin, Penicillin g, Fluconazole, Difuleron [ferrous fumarate-dss], and Other  Current Medications:  Current Outpatient Medications:    acetaminophen (TYLENOL) 325 MG tablet, Take 2 tablets (650 mg total) by mouth every 6 (six) hours as needed for mild pain (or Fever >/= 101)., Disp: , Rfl:    albuterol (PROVENTIL) (2.5 MG/3ML) 0.083% nebulizer solution, Take 3 mLs (2.5 mg total) by  nebulization in the morning and at bedtime., Disp: 180 mL, Rfl: 11   albuterol (VENTOLIN HFA) 108 (90 Base) MCG/ACT inhaler, Inhale 2 puffs into the lungs every 6 (six) hours as needed for wheezing or shortness of breath., Disp: 8 g, Rfl: 3   ALPRAZolam (XANAX) 0.5 MG tablet, Take 1 tablet by mouth twice daily as needed for anxiety, Disp: 60 tablet, Rfl: 2   amLODipine (NORVASC) 5 MG tablet, Take 1 tablet by mouth once daily, Disp: 90 tablet, Rfl: 0   atorvastatin (LIPITOR) 10 MG tablet, Take 1 tablet by mouth once daily, Disp: 90 tablet, Rfl: 1   b complex vitamins tablet, Take 1 tablet by mouth daily., Disp: , Rfl:    BIOTIN PO, Take by mouth., Disp: , Rfl:    busPIRone (BUSPAR) 15 MG tablet, Take 1.5 tabs po BID, Disp: 270 tablet, Rfl: 1   Cholecalciferol (VITAMIN D-1000 MAX ST) 25 MCG (1000 UT) tablet, Take 2,000 Units by mouth daily. Takes three 50 mcg tablets, 3 capsules daily, Disp: , Rfl:    Emollient (COLLAGEN EX), , Disp: , Rfl:    famotidine (PEPCID) 20 MG tablet, Take 1 tablet by mouth twice daily, Disp: 180 tablet, Rfl: 1   Ferrous Gluconate (IRON 27 PO), Take by mouth., Disp: , Rfl:    glipiZIDE (GLUCOTROL) 5 MG tablet, Take 2 tablets (10 mg total) by mouth daily before breakfast. (Patient taking differently: Take 10 mg by mouth 2 (two) times daily before a meal.), Disp: , Rfl:    glucose blood (ONETOUCH ULTRA) test strip, USE TO check blood glucose ONCE DAILY AND AS  NEEDED FOR DIABETES, Disp: 100 strip, Rfl: 1   losartan (COZAAR) 25 MG tablet, Take 1 tablet (25 mg total) by mouth daily., Disp: 90 tablet, Rfl: 2   magnesium oxide (MAG-OX) 400 (240 Mg) MG tablet, Take 1 tablet (400 mg total) by mouth 2 (two) times daily. (Patient taking differently: Take 400 mg by mouth daily.), Disp: 60 tablet, Rfl: 1   metFORMIN (GLUCOPHAGE) 500 MG tablet, Take 2,000 mg by mouth daily with breakfast., Disp: , Rfl:    Multiple Vitamins-Minerals (WOMENS MULTIVITAMIN PO), Take by mouth., Disp: , Rfl:     omeprazole (PRILOSEC) 20 MG capsule, Take 1 capsule by mouth once daily at bedtime, Disp: 90 capsule, Rfl: 0   ondansetron (ZOFRAN) 4 MG tablet, Take 1 tablet (4 mg total) by mouth daily as needed., Disp: 20 tablet, Rfl: 1   rOPINIRole (REQUIP) 1 MG tablet, TAKE 1/2 (ONE-HALF) TABLET BY MOUTH IN THE MORNING AND 3 AT BEDTIME (Patient taking differently: TAKE 1 TABLET BY MOUTH IN THE MORNING AND 3 AT BEDTIME), Disp: 315 tablet, Rfl: 1   sertraline (ZOLOFT) 100 MG tablet, Take 1 tablet (100 mg total) by mouth daily., Disp: 90 tablet, Rfl: 1   Sodium Chloride (HYPERSAL) 3.5 % NEBU, Take 1 each by nebulization 2 (two) times daily as needed., Disp: 240 mL, Rfl: 5   UNABLE TO FIND, Alpha lipoic acid bid, Disp: , Rfl:    zinc gluconate 50 MG tablet, Take 50 mg by mouth daily., Disp: , Rfl:    amitriptyline (ELAVIL) 10 MG tablet, Take 2-3 tablets (20-30 mg total) by mouth at bedtime as needed for sleep., Disp: 180 tablet, Rfl: 1 Medication Side Effects: none  Family Medical/ Social History: Changes? No  MENTAL HEALTH EXAM:  There were no vitals taken for this visit.There is no height or weight on file to calculate BMI.  General Appearance: Casual and Well Groomed  Eye Contact:  Good  Speech:  Clear and Coherent and Normal Rate  Volume:  Normal  Mood:  Euthymic  Affect:  Congruent  Thought Process:  Goal Directed and Descriptions of Associations: Circumstantial  Orientation:  Full (Time, Place, and Person)  Thought Content: Logical   Suicidal Thoughts:  No  Homicidal Thoughts:  No  Memory:  WNL  Judgement:  Good  Insight:  Good  Psychomotor Activity:  Normal  Concentration:  Concentration: Good  Recall:  Good  Fund of Knowledge: Good  Language: Good  Assets:  Communication Skills Desire for Improvement Financial Resources/Insurance Housing Resilience Transportation  ADL's:  Intact  Cognition: WNL  Prognosis:  Good   DIAGNOSES:    ICD-10-CM   1. Generalized anxiety disorder  F41.1      2. Insomnia, unspecified type  G47.00     3. Mild depression  F32.A       Receiving Psychotherapy: No   RECOMMENDATIONS:   PDMP reviewed.  Xanax filled 05/01/2023.  Temazepam filled 09/14/2022. I provided 20 minutes of face to face time during this encounter, including time spent before and after the visit in records review, medical decision making, counseling pertinent to today's visit, and charting.   I am glad she is doing better with the anxiety since restarting the Zoloft.  We agreed to leave that dose the same.  She is still having trouble sleeping so I recommended increasing the amitriptyline.  It is okay to send in amitriptyline 25 mg if she calls and prefers that dose.  Continue Xanax 0.5 mg, 1 p.o. twice daily as  needed anxiety or sleep. Increase amitriptyline 10 mg, to 2 to 3 pills nightly as needed. Continue BuSpar 15 mg, 1.5 pills twice daily. Continue ropinirole 1 mg, one half p.o. every morning and 3 p.o. nightly by another provider. Continue Zoloft 100 mg, 1 p.o. daily. Continue Continue vitamins and supplements as per med list. Return in 6-8 weeks.  Marvia Slocumb, PA-C

## 2023-06-04 DIAGNOSIS — J690 Pneumonitis due to inhalation of food and vomit: Secondary | ICD-10-CM | POA: Diagnosis not present

## 2023-06-11 ENCOUNTER — Other Ambulatory Visit: Payer: Self-pay | Admitting: Family Medicine

## 2023-06-16 ENCOUNTER — Other Ambulatory Visit: Payer: Self-pay

## 2023-06-16 DIAGNOSIS — F411 Generalized anxiety disorder: Secondary | ICD-10-CM

## 2023-06-17 MED ORDER — ALPRAZOLAM 0.5 MG PO TABS: 0.5000 mg | ORAL_TABLET | Freq: Two times a day (BID) | ORAL | 1 refills | Status: DC | PRN

## 2023-06-21 DIAGNOSIS — E1169 Type 2 diabetes mellitus with other specified complication: Secondary | ICD-10-CM | POA: Diagnosis not present

## 2023-06-21 DIAGNOSIS — E785 Hyperlipidemia, unspecified: Secondary | ICD-10-CM | POA: Diagnosis not present

## 2023-06-21 DIAGNOSIS — E1159 Type 2 diabetes mellitus with other circulatory complications: Secondary | ICD-10-CM | POA: Diagnosis not present

## 2023-06-21 LAB — HEMOGLOBIN A1C: Hemoglobin A1C: 6

## 2023-07-04 DIAGNOSIS — J690 Pneumonitis due to inhalation of food and vomit: Secondary | ICD-10-CM | POA: Diagnosis not present

## 2023-07-16 ENCOUNTER — Ambulatory Visit
Admission: EM | Admit: 2023-07-16 | Discharge: 2023-07-16 | Disposition: A | Discharge status: Home / Self Care | Attending: Emergency Medicine | Admitting: Emergency Medicine

## 2023-07-16 ENCOUNTER — Ambulatory Visit: Payer: Self-pay

## 2023-07-16 ENCOUNTER — Ambulatory Visit
Admission: RE | Admit: 2023-07-16 | Discharge: 2023-07-16 | Disposition: A | Discharge status: Home / Self Care | Attending: Emergency Medicine | Admitting: Emergency Medicine

## 2023-07-16 ENCOUNTER — Ambulatory Visit
Admission: RE | Admit: 2023-07-16 | Discharge: 2023-07-16 | Disposition: A | Discharge status: Home / Self Care | Source: Home / Self Care | Attending: Emergency Medicine | Admitting: Emergency Medicine

## 2023-07-16 DIAGNOSIS — J069 Acute upper respiratory infection, unspecified: Secondary | ICD-10-CM

## 2023-07-16 DIAGNOSIS — Z8701 Personal history of pneumonia (recurrent): Secondary | ICD-10-CM | POA: Diagnosis not present

## 2023-07-16 DIAGNOSIS — R509 Fever, unspecified: Secondary | ICD-10-CM | POA: Diagnosis not present

## 2023-07-16 DIAGNOSIS — R058 Other specified cough: Secondary | ICD-10-CM | POA: Diagnosis not present

## 2023-07-16 DIAGNOSIS — R9389 Abnormal findings on diagnostic imaging of other specified body structures: Secondary | ICD-10-CM | POA: Diagnosis not present

## 2023-07-16 LAB — POC COVID19/FLU A&B COMBO
Covid Antigen, POC: NEGATIVE
Influenza A Antigen, POC: NEGATIVE
Influenza B Antigen, POC: NEGATIVE

## 2023-07-16 MED ORDER — AZITHROMYCIN 250 MG PO TABS: 250.0000 mg | ORAL_TABLET | Freq: Every day | ORAL | 0 refills | Status: DC

## 2023-07-16 NOTE — Telephone Encounter (Signed)
 Pt notified of Dr. Belva Boyden comments no appt available here and pt declined UC eval pt said she doesn't feel well and is "to weak" to go to an UC. I advise pt that her sxs are concerning and that if she isn't able to go to an UC she may want to go to ER she can always call 911 if she is unable to drive. Pt said she will see how she feels tomorrow but declined to go anywhere today. ER precautions given and strongly encouraged pt to get evaluated   FYI to PCP

## 2023-07-16 NOTE — ED Provider Notes (Signed)
 Suzanne Strong    CSN: [id number] Arrival date & time: 07/16/23  1347      History   Chief Complaint Chief Complaint  Patient presents with   Cough   Sore Throat    HPI Suzanne Strong is a 78 y.o. female.  Accompanied by a friend, patient presents with 1 day history of fever, sore throat, productive cough.  She reports Tmax 101.2.  She took Tylenol  at noon.  She used her albuterol  nebulizer last night which was prescribed for her last year when she had pneumonia.  She denies chest pain or shortness of breath.  Patient also presents with poison oak rash on her wrist and hand x 1 week.  She has been treating this with calamine lotion.  The history is provided by the patient, medical records and a friend.    Past Medical History:  Diagnosis Date   Allergy 1999   Anemia    iron  deficiency and B12 deficiency   Anxiety    Arthritis 2022   some in my knees   Breast cancer (HCC) 02/25/2015   upper-outer quadrant of left female breast, Triple Negative. Neo-adjuvant chemo with complete pathologic response.    Cataract Floating cataracts 2018   Cervical dysplasia    conization   Chronic kidney disease 2024   questionable   Diabetes mellitus without complication (HCC)    Diverticulosis    Dyspnea    Fever 04/11/2015   Gastroparesis    related to previous radiation tx   GERD (gastroesophageal reflux disease)    Hyperlipidemia    Hypertension    Neuromuscular disorder (HCC)    Neuropathy    legs/feet, S/P chemo meds   Personal history of chemotherapy    Personal history of radiation therapy 2017   LEFT lumpectomy w/ radiation   Pneumonia    RLS (restless legs syndrome)    Seizures (HCC) 2022   Shingles    Hx of   Sleep apnea    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HCC) 2006   Wears dentures    partial bottom    Patient Active Problem List   Diagnosis Date Noted   Dermatitis associated with moisture 01/21/2023   OA (osteoarthritis) of knee 01/06/2023   Type 2 diabetes  mellitus with diabetic polyneuropathy, unspecified whether long term insulin  use (HCC) 12/21/2022   Change in mental status 11/10/2022   Bronchiectasis without complication (HCC) 07/31/2022   Diarrhea 07/22/2022   Lymphadenopathy 06/24/2022   Poor balance 04/28/2022   Vision changes 02/12/2022   Polypharmacy 02/12/2022   At high risk for aspiration 04/23/2021   Recurrent aspiration pneumonia (HCC) 12/09/2020   Dysuria 07/17/2020   Dysphagia    Gastric erythema    Gastric polyp    Encounter for screening colonoscopy    Polyp of colon    History of aspiration pneumonia 02/15/2020   Diabetes mellitus without complication (HCC)    GERD (gastroesophageal reflux disease)    Hypertension    Urinary urgency 01/05/2020   Grief reaction 09/18/2019   Swallowing dysfunction 07/18/2019   Fever, intermittent 09/01/2018   Pre-syncope 02/07/2018   Episodic weakness 01/26/2018   Medicare annual wellness visit, subsequent 06/29/2017   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   Preoperative examination 04/27/2017   Carpal tunnel syndrome of right wrist 11/13/2016   Obstructive sleep apnea 01/01/2016   Carcinoma of upper-outer quadrant of left breast in female, estrogen receptor negative (HCC) 11/21/2015   Hypomagnesemia 08/28/2015   Initial Medicare annual wellness visit 10/16/2014  Estrogen deficiency 10/16/2014   Colon cancer screening 10/16/2014   Controlled type 2 diabetes mellitus without complication (HCC) 03/28/2014   RLS (restless legs syndrome) 02/21/2014   Chronic neck pain 07/25/2010   Depression with anxiety 07/07/2010   Routine general medical examination at a health care facility 06/26/2010   B12 deficiency 09/04/2009   Anemia, iron  deficiency 08/29/2009   Gastroparesis 08/26/2009   Vitamin D  deficiency 07/19/2008   Disorder of bone and cartilage 07/19/2007   Hyperlipidemia 07/18/2007   Edema 07/18/2007    Past Surgical History:  Procedure Laterality Date    APPENDECTOMY     BIOPSY  02/02/2023   Procedure: BIOPSY;  Surgeon: Luke Salaam, MD;  Location: [facility] ENDOSCOPY;  Service: Gastroenterology;;   BREAST BIOPSY Left 02/25/2015   INVASIVE MAMMARY CARCINOMA,Triple negative.   BREAST CYST ASPIRATION     BREAST EXCISIONAL BIOPSY Left 12/2015   surgery   BREAST LUMPECTOMY WITH NEEDLE LOCALIZATION Left 10/02/2015   Procedure: BREAST LUMPECTOMY WITH NEEDLE LOCALIZATION;  Surgeon: Marshall Skeeter, MD;  Location: [facility] ORS;  Service: General;  Laterality: Left;   BREAST LUMPECTOMY WITH SENTINEL LYMPH NODE BIOPSY Left 10/02/2015   Procedure: LEFT BREAST WIDE EXCISION WITH SENTINEL LYMPH NODE BX;  Surgeon: Marshall Skeeter, MD;  Location: [facility] ORS;  Service: General;  Laterality: Left;   BREAST SURGERY     breast biopsy benign   CARPAL TUNNEL RELEASE Right 05/19/2017   Procedure: CARPAL TUNNEL RELEASE;  Surgeon: Marlynn Singer, MD;  Location: [facility] ORS;  Service: Orthopedics;  Laterality: Right;   CATARACT EXTRACTION W/PHACO Right 10/26/2018   Procedure: CATARACT EXTRACTION PHACO AND INTRAOCULAR LENS PLACEMENT (IOC) right diabetic;  Surgeon: Annell Kidney, MD;  Location: [facility];  Service: Ophthalmology;  Laterality: Right;  Diabetic - oral meds cancer center been notified and will come   CATARACT EXTRACTION W/PHACO Left 11/16/2018   Procedure: CATARACT EXTRACTION PHACO AND INTRAOCULAR LENS PLACEMENT (IOC) LEFT DIABETIC  01:01.0  17.0%  10.37;  Surgeon: Annell Kidney, MD;  Location: [facility];  Service: Ophthalmology;  Laterality: Left;  Diabetic - oral meds Port-a-cath   CHOLECYSTECTOMY     Cologuard  07/22/2016   Negative   COLONOSCOPY WITH PROPOFOL  N/A 05/24/2020   Procedure: COLONOSCOPY WITH PROPOFOL ;  Surgeon: Irby Mannan, MD;  Location: [facility] ENDOSCOPY;  Service: Endoscopy;  Laterality: N/A;   COLONOSCOPY WITH PROPOFOL   02/02/2023   Procedure: COLONOSCOPY WITH PROPOFOL ;  Surgeon: Luke Salaam, MD;   Location: [facility] ENDOSCOPY;  Service: Gastroenterology;;   ESOPHAGOGASTRODUODENOSCOPY  07/2009   normal with few gastric polyps   ESOPHAGOGASTRODUODENOSCOPY (EGD) WITH PROPOFOL  N/A 05/24/2020   Procedure: ESOPHAGOGASTRODUODENOSCOPY (EGD) WITH PROPOFOL ;  Surgeon: Irby Mannan, MD;  Location: [facility] ENDOSCOPY;  Service: Endoscopy;  Laterality: N/A;   ESOPHAGOGASTRODUODENOSCOPY (EGD) WITH PROPOFOL  N/A 02/02/2023   Procedure: ESOPHAGOGASTRODUODENOSCOPY (EGD) WITH PROPOFOL ;  Surgeon: Luke Salaam, MD;  Location: [facility] ENDOSCOPY;  Service: Gastroenterology;  Laterality: N/A;   EYE SURGERY  10/26/2018   FLEXIBLE BRONCHOSCOPY Bilateral 06/24/2022   Procedure: FLEXIBLE BRONCHOSCOPY;  Surgeon: Vergia Glasgow, MD;  Location: [facility] ORS;  Service: Pulmonary;  Laterality: Bilateral;   FRACTURE SURGERY  1948 ish   broke my left arm as a child   GIVENS CAPSULE STUDY N/A 05/04/2023   Procedure: IMAGING PROCEDURE, GI TRACT, INTRALUMINAL, VIA CAPSULE;  Surgeon: Luke Salaam, MD;  Location: [facility] ENDOSCOPY;  Service: Gastroenterology;  Laterality: N/A;   MOLE REMOVAL     PORT-A-CATH REMOVAL     PORTACATH PLACEMENT  PORTACATH PLACEMENT Right 03/12/2015   Procedure: INSERTION PORT-A-CATH;  Surgeon: Marshall Skeeter, MD;  Location: [facility] ORS;  Service: General;  Laterality: Right;   RADICAL NECK DISSECTION     TONSILLECTOMY Left    cancer treated with chemo and radiation   TRIGGER FINGER RELEASE Right 05/19/2017   Procedure: RELEASE TRIGGER FINGER/A-1 PULLEY ring finger;  Surgeon: Marlynn Singer, MD;  Location: [facility] ORS;  Service: Orthopedics;  Laterality: Right;   VIDEO BRONCHOSCOPY WITH ENDOBRONCHIAL ULTRASOUND Bilateral 06/24/2022   Procedure: VIDEO BRONCHOSCOPY WITH ENDOBRONCHIAL ULTRASOUND;  Surgeon: Vergia Glasgow, MD;  Location: [facility] ORS;  Service: Pulmonary;  Laterality: Bilateral;    OB History   No obstetric history on file.      Home Medications    Prior to Admission medications    Medication Sig Start Date End Date Taking? Authorizing Provider  azithromycin  (ZITHROMAX ) 250 MG tablet Take 1 tablet (250 mg total) by mouth daily. Take first 2 tablets together, then 1 every day until finished. 07/16/23  Yes Wellington Half, NP  acetaminophen  (TYLENOL ) 325 MG tablet Take 2 tablets (650 mg total) by mouth every 6 (six) hours as needed for mild pain (or Fever >/= 101). 05/30/20   Deforest Fast, MD  albuterol  (PROVENTIL ) (2.5 MG/3ML) 0.083% nebulizer solution Take 3 mLs (2.5 mg total) by nebulization in the morning and at bedtime. 05/26/22 05/26/23  Vergia Glasgow, MD  albuterol  (VENTOLIN  HFA) 108 (90 Base) MCG/ACT inhaler Inhale 2 puffs into the lungs every 6 (six) hours as needed for wheezing or shortness of breath. 03/10/22   Tower, Manley Seeds, MD  ALPRAZolam  (XANAX ) 0.5 MG tablet Take 1 tablet (0.5 mg total) by mouth 2 (two) times daily as needed. for anxiety 06/17/23   Marvia Slocumb T, PA-C  amitriptyline  (ELAVIL ) 10 MG tablet Take 2-3 tablets (20-30 mg total) by mouth at bedtime as needed for sleep. 05/26/23   Marvia Slocumb T, PA-C  amLODipine  (NORVASC ) 5 MG tablet Take 1 tablet by mouth once daily 04/23/23   Tower, Manley Seeds, MD  atorvastatin  (LIPITOR) 10 MG tablet Take 1 tablet by mouth once daily 05/04/23   Tower, Manley Seeds, MD  b complex vitamins tablet Take 1 tablet by mouth daily.    [provider]  BIOTIN PO Take by mouth.    [provider]  busPIRone  (BUSPAR ) 15 MG tablet Take 1.5 tabs po BID 11/17/22   Roselyn Connor, PMHNP  Cholecalciferol  (VITAMIN D -1000 MAX ST) 25 MCG (1000 UT) tablet Take 2,000 Units by mouth daily. Takes three 50 mcg tablets, 3 capsules daily    [provider]  Emollient (COLLAGEN EX)     [provider]  famotidine  (PEPCID ) 20 MG tablet Take 1 tablet by mouth twice daily 06/11/23   Tower, Manley Seeds, MD  Ferrous Gluconate (IRON  27 PO) Take by mouth.    [provider]  glipiZIDE  (GLUCOTROL ) 5 MG tablet Take 2 tablets (10  mg total) by mouth daily before breakfast. Patient taking differently: Take 10 mg by mouth 2 (two) times daily before a meal. 08/18/21   Donnie Galea, MD  glucose blood (ONETOUCH ULTRA) test strip USE TO check blood glucose ONCE DAILY AND AS NEEDED FOR DIABETES 12/03/20   Tower, Manley Seeds, MD  losartan  (COZAAR ) 25 MG tablet Take 1 tablet (25 mg total) by mouth daily. 01/06/23   Tower, Manley Seeds, MD  magnesium  oxide (MAG-OX) 400 (240 Mg) MG tablet Take 1 tablet (400 mg total) by mouth 2 (two)  times daily. Patient taking differently: Take 400 mg by mouth daily. 11/16/22   Tower, Manley Seeds, MD  metFORMIN  (GLUCOPHAGE ) 500 MG tablet Take 2,000 mg by mouth daily with breakfast.    [provider]  Multiple Vitamins-Minerals (WOMENS MULTIVITAMIN PO) Take by mouth.    [provider]  omeprazole  (PRILOSEC) 20 MG capsule Take 1 capsule by mouth once daily at bedtime 05/10/23   Tower, Starr School A, MD  ondansetron  (ZOFRAN ) 4 MG tablet Take 1 tablet (4 mg total) by mouth daily as needed. 12/21/22   Tower, Manley Seeds, MD  rOPINIRole  (REQUIP ) 1 MG tablet TAKE 1/2 (ONE-HALF) TABLET BY MOUTH IN THE MORNING AND 3 AT BEDTIME Patient taking differently: TAKE 1 TABLET BY MOUTH IN THE MORNING AND 3 AT BEDTIME 05/04/23   Tower, Manley Seeds, MD  sertraline  (ZOLOFT ) 100 MG tablet Take 1 tablet (100 mg total) by mouth daily. 05/26/23   Marvia Slocumb T, PA-C  Sodium Chloride  (HYPERSAL) 3.5 % NEBU Take 1 each by nebulization 2 (two) times daily as needed. 07/31/22   Parrett, Macdonald Savoy, NP  UNABLE TO FIND Alpha lipoic acid bid    [provider]  zinc gluconate 50 MG tablet Take 50 mg by mouth daily.    [provider]    Family History Family History  Problem Relation Age of Onset   Osteoporosis Mother    Hyperlipidemia Mother    Depression Mother    Cancer Mother        skin cancer ? basal cell and lung ca heavy smoker   Anxiety disorder Mother    COPD Mother    Drug abuse Mother    Miscarriages /  [place] Mother    Varicose Veins Mother    Alcohol abuse Father    Cancer Father        skin CA ? basal cell   Heart disease Father        CHF   Hearing loss Father    Hyperlipidemia Brother    Hypertension Brother    Breast cancer Neg Hx     Social History Social History   Tobacco Use   Smoking status: Former    Current packs/day: 0.00    Average packs/day: 0.5 packs/day for 40.0 years (20.0 ttl pk-yrs)    Types: Cigarettes    Quit date: 1985    Years since quitting: 40.4   Smokeless tobacco: Never   Tobacco comments:    Not much of a smoker, peer pressure mainly  Vaping Use   Vaping status: Never Used  Substance Use Topics   Alcohol use: No   Drug use: No     Allergies   Amoxicillin, Penicillin g, Fluconazole, Difuleron [ferrous fumarate-dss], and Other   Review of Systems Review of Systems  Constitutional:  Positive for fever. Negative for chills.  HENT:  Positive for sore throat and voice change. Negative for ear pain.   Respiratory:  Positive for cough. Negative for shortness of breath.   Cardiovascular:  Negative for chest pain and palpitations.     Physical Exam Triage Vital Signs ED Triage Vitals [07/16/23 1350]  Encounter Vitals Group     BP      Systolic BP Percentile      Diastolic BP Percentile      Pulse      Resp      Temp      Temp src      SpO2      Weight  Height      Head Circumference      Peak Flow      Pain Score 8     Pain Loc      Pain Education      Exclude from Growth Chart    No data found.  Updated Vital Signs BP 101/66   Pulse 66   Temp 98.2 F (36.8 C)   Resp 20   SpO2 94%   Visual Acuity Right Eye Distance:   Left Eye Distance:   Bilateral Distance:    Right Eye Near:   Left Eye Near:    Bilateral Near:     Physical Exam Constitutional:      General: She is not in acute distress. HENT:     Right Ear: Tympanic membrane normal.     Left Ear: Tympanic membrane normal.     Nose: Nose normal.      Mouth/Throat:     Mouth: Mucous membranes are moist.  Cardiovascular:     Rate and Rhythm: Normal rate and regular rhythm.     Heart sounds: Normal heart sounds.  Pulmonary:     Effort: Pulmonary effort is normal.     Breath sounds: Rhonchi present.     Comments: Upper airway congestion which clears with cough. Skin:    General: Skin is warm and dry.     Findings: Rash present.     Comments: Small erythematous papular patch on right wrist and left hand.  Neurological:     Mental Status: She is alert.      UC Treatments / Results  Labs (all labs ordered are listed, but only abnormal results are displayed) Labs Reviewed  POC COVID19/FLU A&B COMBO - Normal    EKG   Radiology DG Chest 2 View Result Date: 07/16/2023 CLINICAL DATA:  Productive cough and fever for 2 days. EXAM: CHEST - 2 VIEW COMPARISON:  12/15/2022 and 07/21/2022 FINDINGS: The heart size and mediastinal contours are within normal limits. Stable mild elevation of left hemidiaphragm. Both lungs are clear. Lower thoracic spine degenerative disc disease noted. IMPRESSION: No active cardiopulmonary disease. Electronically Signed   By: Marlyce Sine M.D.   On: 07/16/2023 16:01    Procedures Procedures (including critical care time)  Medications Ordered in UC Medications - No data to display  Initial Impression / Assessment and Plan / UC Course  I have reviewed the triage vital signs and the nursing notes.  Pertinent labs & imaging results that were available during my care of the patient were reviewed by me and considered in my medical decision making (see chart for details).    Productive cough, history of pneumonia, acute upper respiratory infection.  No respiratory distress.  O2 sat 94% on room air.  Rapid COVID and flu negative.  CXR negative.  Treating with Zithromax .  Instructed patient to follow-up with her PCP on Monday.  ED precautions given.  Education provided on cough and upper respiratory infection.   She agrees to plan of care.  Final Clinical Impressions(s) / UC Diagnoses   Final diagnoses:  Productive cough  History of pneumonia  Acute upper respiratory infection     Discharge Instructions      Go to [facility] for your x-ray.    [address]  [address] [place], [place] [address] [phone]   Follow up with your primary care provider tomorrow.  Go to the emergency department if you have worsening symptoms.    Take the  Zithromax  as directed.        ED Prescriptions     Medication Sig Dispense Auth. Provider   azithromycin  (ZITHROMAX ) 250 MG tablet Take 1 tablet (250 mg total) by mouth daily. Take first 2 tablets together, then 1 every day until finished. 6 tablet Wellington Half, NP      PDMP not reviewed this encounter.   Wellington Half, NP 07/16/23 [phone]

## 2023-07-16 NOTE — Telephone Encounter (Signed)
 I am out of town Needs appointment with first available or UC  She has had pneumonia in the past, the ST is more of a new symptom

## 2023-07-16 NOTE — Telephone Encounter (Signed)
 Copied from CRM [id number]. Topic: Clinical - Red Word Triage >> Jul 16, 2023  9:31 AM Dimple Francis wrote: Red Word that prompted transfer to Nurse Triage: sore throat, lost voice, fever up to 101  Chief Complaint: Sore throat Symptoms: Productive cough, fever, hoarse voice Frequency: Since yesterday Pertinent Negatives: Patient denies difficulty breathing Disposition: [] ED /[] Urgent Care (no appt availability in office) / [x] Appointment(In office/virtual)/ []  [facility]/ [] Home Care/ [x] Refused Recommended Disposition /[] [facility]/ []  Follow-up with PCP Additional Notes: Patient called in with an extremely hoarse voice. Patient also reported a productive cough, fever and sore throat. Patient denied difficulty breathing and swallowing. Advised patient to be seen within 24 hours, per protocol. Patient declined appointment and is requesting provider to call medication in. Provided care advice and instructed patient to call back if symptoms worsen. Patient complied. Please advise.  Reason for Disposition  SEVERE (e.g., excruciating) throat pain  Answer Assessment - Initial Assessment Questions 1. ONSET: "When did the throat start hurting?" (Hours or days ago)      Yesterday 2. SEVERITY: "How bad is the sore throat?" (Scale 1-10; mild, moderate or severe)   - MILD (1-3):  Doesn't interfere with eating or normal activities.   - MODERATE (4-7): Interferes with eating some solids and normal activities.   - SEVERE (8-10):  Excruciating pain, interferes with most normal activities.   - SEVERE WITH DYSPHAGIA (10): Can't swallow liquids, drooling.     States she is able to swallow normally, patient can barely speak on the phone with this RN 3. STREP EXPOSURE: "Has there been any exposure to strep within the past week?" If Yes, ask: "What type of contact occurred?"      Denies 4.  VIRAL SYMPTOMS: "Are there any symptoms of a cold, such as a runny nose, cough, hoarse voice or red  eyes?"      Productive cough- yellow phlegm 5. FEVER: "Do you have a fever?" If Yes, ask: "What is your temperature, how was it measured, and when did it start?"     101.2 when Tylenol  wears off 6. PUS ON THE TONSILS: "Is there pus on the tonsils in the back of your throat?"     Denies 7. OTHER SYMPTOMS: "Do you have any other symptoms?" (e.g., difficulty breathing, headache, rash)     Extremely hoarse voice, denies difficulty breathing  Protocols used: Sore Throat-A-AH

## 2023-07-16 NOTE — Telephone Encounter (Signed)
 Agreed, if she is too weak to go anywhere would recommend [phone]  That is concerning   Would someone please check in with her later today?

## 2023-07-16 NOTE — Telephone Encounter (Signed)
 Left VM requesting pt to call the office back

## 2023-07-16 NOTE — ED Triage Notes (Signed)
 Patient to Urgent Care with complaints of fevers (max 101.2)/ sore throat/ productive cough (yellow).  Symptoms started yesterday. Also w/ poison oak rash on face/ arms and wrists that started over the weekend.  Meds: tylenol .

## 2023-07-16 NOTE — Discharge Instructions (Addendum)
 Go to [facility] for your x-ray.    [address]  [address] [place], [place] [address] [phone]   Follow up with your primary care provider tomorrow.  Go to the emergency department if you have worsening symptoms.    Take the Zithromax  as directed.

## 2023-07-19 ENCOUNTER — Ambulatory Visit (HOSPITAL_COMMUNITY): Payer: Self-pay

## 2023-07-19 NOTE — Telephone Encounter (Signed)
Pt went to UC over the weekend.

## 2023-07-22 ENCOUNTER — Encounter: Payer: Self-pay | Admitting: Family Medicine

## 2023-07-22 ENCOUNTER — Ambulatory Visit (INDEPENDENT_AMBULATORY_CARE_PROVIDER_SITE_OTHER): Admitting: Family Medicine

## 2023-07-22 VITALS — BP 118/58 | HR 76 | Temp 97.8°F | Ht 66.0 in | Wt 197.0 lb

## 2023-07-22 DIAGNOSIS — J479 Bronchiectasis, uncomplicated: Secondary | ICD-10-CM

## 2023-07-22 DIAGNOSIS — J069 Acute upper respiratory infection, unspecified: Secondary | ICD-10-CM | POA: Diagnosis not present

## 2023-07-22 DIAGNOSIS — L255 Unspecified contact dermatitis due to plants, except food: Secondary | ICD-10-CM | POA: Diagnosis not present

## 2023-07-22 NOTE — Progress Notes (Signed)
 Subjective:    Patient ID: Suzanne Strong, female    DOB: Sep 25, 1945, 78 y.o.   MRN: [id number]  HPI  Wt Readings from Last 3 Encounters:  07/22/23 197 lb (89.4 kg)  03/31/23 187 lb 9.6 oz (85.1 kg)  02/02/23 180 lb (81.6 kg)   31.80 kg/m  Vitals:   07/22/23 1204 07/22/23 1228  BP: (!) 166/82 (!) 118/58  Pulse: 76   Temp: 97.8 F (36.6 C)   SpO2: 97%      Pt presents for follow up of uri symptoms / after UC visit   She presented ot UC on 5/30 with one day history of fever/ST and prod cough with tmax of 101.2  Using albuterol  nmt  Also poison oak rash on wrist and hand   Flu and covid tests wee negative   Cxr done  DG Chest 2 View Result Date: 07/16/2023 CLINICAL DATA:  Productive cough and fever for 2 days. EXAM: CHEST - 2 VIEW COMPARISON:  12/15/2022 and 07/21/2022 FINDINGS: The heart size and mediastinal contours are within normal limits. Stable mild elevation of left hemidiaphragm. Both lungs are clear. Lower thoracic spine degenerative disc disease noted. IMPRESSION: No active cardiopulmonary disease. Electronically Signed   By: Marlyce Sine M.D.   On: 07/16/2023 16:01    Was prescription azithromycin    Per note: Productive cough, history of pneumonia, acute upper respiratory infection.  No respiratory distress.  O2 sat 94% on room air.  Rapid COVID and flu negative.  CXR negative.  Treating with Zithromax .  Instructed patient to follow-up with her PCP on Monday.  ED precautions given.  Education provided on cough and upper respiratory infection.  She agrees to plan of care.   Today  Pulse ox 97%   Has past history of aspiration pna and also bronchiectatic changes in LLL due to recurrent aspiration / has seen pulmonary  Albuterol  Nebs Flutter valve  GERD control  Aspiration precautions   Seen a year ago  Was supposed to have follow up with Dr Darnelle Elders 2 months later   Today feels tired  The cough is still there - is occational productive - phlegm is  yellow to green  It is improved however  Using breathing treatments once a day  Uses cough drops  No expectorants or cough suppressants   The throat is still there-improved but not gone  Voice is a little hoarse still   No more fever  No trouble breathing  Some wheezing at night -not severe   Does not have choking spells as often  Is very careful  Hot food/liquid tends to flare it     Patient Active Problem List   Diagnosis Date Noted   Viral URI with cough 07/22/2023   Dermatitis associated with moisture 01/21/2023   OA (osteoarthritis) of knee 01/06/2023   Type 2 diabetes mellitus with diabetic polyneuropathy, unspecified whether long term insulin  use (HCC) 12/21/2022   Change in mental status 11/10/2022   Bronchiectasis without complication (HCC) 07/31/2022   Diarrhea 07/22/2022   Lymphadenopathy 06/24/2022   Poor balance 04/28/2022   Vision changes 02/12/2022   Polypharmacy 02/12/2022   Plant dermatitis 10/21/2021   At high risk for aspiration 04/23/2021   Recurrent aspiration pneumonia (HCC) 12/09/2020   Dysuria 07/17/2020   Dysphagia    Gastric erythema    Gastric polyp    Encounter for screening colonoscopy    Polyp of colon    History of aspiration pneumonia 02/15/2020   Diabetes mellitus without  complication (HCC)    GERD (gastroesophageal reflux disease)    Hypertension    Urinary urgency 01/05/2020   Grief reaction 09/18/2019   Swallowing dysfunction 07/18/2019   Fever, intermittent 09/01/2018   Pre-syncope 02/07/2018   Episodic weakness 01/26/2018   Medicare annual wellness visit, subsequent 06/29/2017   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   Preoperative examination 04/27/2017   Carpal tunnel syndrome of right wrist 11/13/2016   Obstructive sleep apnea 01/01/2016   Carcinoma of upper-outer quadrant of left breast in female, estrogen receptor negative (HCC) 11/21/2015   Hypomagnesemia 08/28/2015   Initial Medicare annual wellness  visit 10/16/2014   Estrogen deficiency 10/16/2014   Colon cancer screening 10/16/2014   Controlled type 2 diabetes mellitus without complication (HCC) 03/28/2014   RLS (restless legs syndrome) 02/21/2014   Chronic neck pain 07/25/2010   Depression with anxiety 07/07/2010   Routine general medical examination at a health care facility 06/26/2010   B12 deficiency 09/04/2009   Anemia, iron  deficiency 08/29/2009   Gastroparesis 08/26/2009   Vitamin D  deficiency 07/19/2008   Disorder of bone and cartilage 07/19/2007   Hyperlipidemia 07/18/2007   Edema 07/18/2007   Past Medical History:  Diagnosis Date   Allergy 1999   Anemia    iron  deficiency and B12 deficiency   Anxiety    Arthritis 2022   some in my knees   Breast cancer (HCC) 02/25/2015   upper-outer quadrant of left female breast, Triple Negative. Neo-adjuvant chemo with complete pathologic response.    Cataract Floating cataracts 2018   Cervical dysplasia    conization   Chronic kidney disease 2024   questionable   Diabetes mellitus without complication (HCC)    Diverticulosis    Dyspnea    Fever 04/11/2015   Gastroparesis    related to previous radiation tx   GERD (gastroesophageal reflux disease)    Hyperlipidemia    Hypertension    Neuromuscular disorder (HCC)    Neuropathy    legs/feet, S/P chemo meds   Personal history of chemotherapy    Personal history of radiation therapy 2017   LEFT lumpectomy w/ radiation   Pneumonia    RLS (restless legs syndrome)    Seizures (HCC) 2022   Shingles    Hx of   Sleep apnea    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HCC) 2006   Wears dentures    partial bottom   Past Surgical History:  Procedure Laterality Date   APPENDECTOMY     BIOPSY  02/02/2023   Procedure: BIOPSY;  Surgeon: Luke Salaam, MD;  Location: [facility] ENDOSCOPY;  Service: Gastroenterology;;   BREAST BIOPSY Left 02/25/2015   INVASIVE MAMMARY CARCINOMA,Triple negative.   BREAST CYST ASPIRATION     BREAST EXCISIONAL BIOPSY  Left 12/2015   surgery   BREAST LUMPECTOMY WITH NEEDLE LOCALIZATION Left 10/02/2015   Procedure: BREAST LUMPECTOMY WITH NEEDLE LOCALIZATION;  Surgeon: Marshall Skeeter, MD;  Location: [facility] ORS;  Service: General;  Laterality: Left;   BREAST LUMPECTOMY WITH SENTINEL LYMPH NODE BIOPSY Left 10/02/2015   Procedure: LEFT BREAST WIDE EXCISION WITH SENTINEL LYMPH NODE BX;  Surgeon: Marshall Skeeter, MD;  Location: [facility] ORS;  Service: General;  Laterality: Left;   BREAST SURGERY     breast biopsy benign   CARPAL TUNNEL RELEASE Right 05/19/2017   Procedure: CARPAL TUNNEL RELEASE;  Surgeon: Marlynn Singer, MD;  Location: [facility] ORS;  Service: Orthopedics;  Laterality: Right;   CATARACT EXTRACTION W/PHACO Right 10/26/2018   Procedure: CATARACT EXTRACTION PHACO AND INTRAOCULAR LENS  PLACEMENT (IOC) right diabetic;  Surgeon: Annell Kidney, MD;  Location: [facility];  Service: Ophthalmology;  Laterality: Right;  Diabetic - oral meds cancer center been notified and will come   CATARACT EXTRACTION W/PHACO Left 11/16/2018   Procedure: CATARACT EXTRACTION PHACO AND INTRAOCULAR LENS PLACEMENT (IOC) LEFT DIABETIC  01:01.0  17.0%  10.37;  Surgeon: Annell Kidney, MD;  Location: [facility];  Service: Ophthalmology;  Laterality: Left;  Diabetic - oral meds Port-a-cath   CHOLECYSTECTOMY     Cologuard  07/22/2016   Negative   COLONOSCOPY WITH PROPOFOL  N/A 05/24/2020   Procedure: COLONOSCOPY WITH PROPOFOL ;  Surgeon: Irby Mannan, MD;  Location: [facility] ENDOSCOPY;  Service: Endoscopy;  Laterality: N/A;   COLONOSCOPY WITH PROPOFOL   02/02/2023   Procedure: COLONOSCOPY WITH PROPOFOL ;  Surgeon: Luke Salaam, MD;  Location: [facility] ENDOSCOPY;  Service: Gastroenterology;;   ESOPHAGOGASTRODUODENOSCOPY  07/2009   normal with few gastric polyps   ESOPHAGOGASTRODUODENOSCOPY (EGD) WITH PROPOFOL  N/A 05/24/2020   Procedure: ESOPHAGOGASTRODUODENOSCOPY (EGD) WITH PROPOFOL ;  Surgeon: Irby Mannan, MD;  Location: [facility] ENDOSCOPY;  Service: Endoscopy;  Laterality: N/A;   ESOPHAGOGASTRODUODENOSCOPY (EGD) WITH PROPOFOL  N/A 02/02/2023   Procedure: ESOPHAGOGASTRODUODENOSCOPY (EGD) WITH PROPOFOL ;  Surgeon: Luke Salaam, MD;  Location: [facility] ENDOSCOPY;  Service: Gastroenterology;  Laterality: N/A;   EYE SURGERY  10/26/2018   FLEXIBLE BRONCHOSCOPY Bilateral 06/24/2022   Procedure: FLEXIBLE BRONCHOSCOPY;  Surgeon: Vergia Glasgow, MD;  Location: [facility] ORS;  Service: Pulmonary;  Laterality: Bilateral;   FRACTURE SURGERY  1948 ish   broke my left arm as a child   GIVENS CAPSULE STUDY N/A 05/04/2023   Procedure: IMAGING PROCEDURE, GI TRACT, INTRALUMINAL, VIA CAPSULE;  Surgeon: Luke Salaam, MD;  Location: [facility] ENDOSCOPY;  Service: Gastroenterology;  Laterality: N/A;   MOLE REMOVAL     PORT-A-CATH REMOVAL     PORTACATH PLACEMENT     PORTACATH PLACEMENT Right 03/12/2015   Procedure: INSERTION PORT-A-CATH;  Surgeon: Marshall Skeeter, MD;  Location: [facility] ORS;  Service: General;  Laterality: Right;   RADICAL NECK DISSECTION     TONSILLECTOMY Left    cancer treated with chemo and radiation   TRIGGER FINGER RELEASE Right 05/19/2017   Procedure: RELEASE TRIGGER FINGER/A-1 PULLEY ring finger;  Surgeon: Marlynn Singer, MD;  Location: [facility] ORS;  Service: Orthopedics;  Laterality: Right;   VIDEO BRONCHOSCOPY WITH ENDOBRONCHIAL ULTRASOUND Bilateral 06/24/2022   Procedure: VIDEO BRONCHOSCOPY WITH ENDOBRONCHIAL ULTRASOUND;  Surgeon: Vergia Glasgow, MD;  Location: [facility] ORS;  Service: Pulmonary;  Laterality: Bilateral;   Social History   Tobacco Use   Smoking status: Former    Current packs/day: 0.00    Average packs/day: 0.5 packs/day for 40.0 years (20.0 ttl pk-yrs)    Types: Cigarettes    Quit date: 53    Years since quitting: 40.4   Smokeless tobacco: Never   Tobacco comments:    Not much of a smoker, peer pressure mainly  Vaping Use   Vaping status: Never Used  Substance Use Topics   Alcohol use:  No   Drug use: No   Family History  Problem Relation Age of Onset   Osteoporosis Mother    Hyperlipidemia Mother    Depression Mother    Cancer Mother        skin cancer ? basal cell and lung ca heavy smoker   Anxiety disorder Mother    COPD Mother    Drug abuse Mother    Miscarriages / [place] Mother    Varicose Veins Mother  Alcohol abuse Father    Cancer Father        skin CA ? basal cell   Heart disease Father        CHF   Hearing loss Father    Hyperlipidemia Brother    Hypertension Brother    Breast cancer Neg Hx    Allergies  Allergen Reactions   Amoxicillin Swelling    REACTION: rash, swelling Has patient had a PCN reaction causing immediate rash, facial/tongue/throat swelling, SOB or lightheadedness with hypotension: yes Has patient had a PCN reaction causing severe rash involving mucus membranes or skin necrosis: no Has patient had a PCN reaction that required hospitalization: no Has patient had a PCN reaction occurring within the last 10 years: yes If all of the above answers are "NO", then may proceed with Cephalosporin use.  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     albuterol  (VENTOLIN  HFA) 108 (90 Base) MCG/ACT inhaler Inhale 2 puffs into the lungs every 6 (six) hours as needed for wheezing or shortness of breath. 8 g 3   ALPRAZolam  (XANAX ) 0.5 MG tablet Take 1 tablet (0.5 mg total) by mouth 2 (two) times daily as needed. for anxiety 60 tablet 1   amitriptyline  (ELAVIL ) 10 MG  tablet Take 2-3 tablets (20-30 mg total) by mouth at bedtime as needed for sleep. 180 tablet 1   amLODipine  (NORVASC ) 5 MG tablet Take 1 tablet by mouth once daily 90 tablet 0   atorvastatin  (LIPITOR) 10 MG tablet Take 1 tablet by mouth once daily 90 tablet 1   azithromycin  (ZITHROMAX ) 250 MG tablet Take 1 tablet (250 mg total) by mouth daily. Take first 2 tablets together, then 1 every day until finished. 6 tablet 0   b complex vitamins tablet Take 1 tablet by mouth daily.     BIOTIN PO Take by mouth.     busPIRone  (BUSPAR ) 15 MG tablet Take 1.5 tabs po BID 270 tablet 1   Cholecalciferol  (VITAMIN D -1000 MAX ST) 25 MCG (1000 UT) tablet Take 2,000 Units by mouth daily. Takes three 50 mcg tablets, 3 capsules daily     Emollient (COLLAGEN EX)      famotidine  (PEPCID ) 20 MG tablet Take 1 tablet by mouth twice daily 180 tablet 0   Ferrous Gluconate (IRON  27 PO) Take by mouth.     glipiZIDE  (GLUCOTROL ) 5 MG tablet Take 2 tablets (10 mg total) by mouth daily before breakfast. (Patient taking differently: Take 10 mg by mouth 2 (two) times daily before a meal.)     glucose blood (ONETOUCH ULTRA) test strip USE TO check blood glucose ONCE DAILY AND AS NEEDED FOR DIABETES 100 strip 1   losartan  (COZAAR ) 25 MG tablet Take 1 tablet (25 mg total) by mouth daily. 90 tablet 2   magnesium  oxide (MAG-OX) 400 (240 Mg) MG tablet Take 1 tablet (400 mg total) by mouth 2 (two) times daily. (Patient taking differently: Take 400 mg by mouth  daily.) 60 tablet 1   metFORMIN  (GLUCOPHAGE ) 500 MG tablet Take 2,000 mg by mouth daily with breakfast.     Multiple Vitamins-Minerals (WOMENS MULTIVITAMIN PO) Take by mouth.     omeprazole  (PRILOSEC) 20 MG capsule Take 1 capsule by mouth once daily at bedtime 90 capsule 0   ondansetron  (ZOFRAN ) 4 MG tablet Take 1 tablet (4 mg total) by mouth daily as needed. 20 tablet 1   rOPINIRole  (REQUIP ) 1 MG tablet TAKE 1/2 (ONE-HALF) TABLET BY MOUTH IN THE MORNING AND 3 AT BEDTIME (Patient  taking differently: TAKE 1 TABLET BY MOUTH IN THE MORNING AND 3 AT BEDTIME) 315 tablet 1   sertraline  (ZOLOFT ) 100 MG tablet Take 1 tablet (100 mg total) by mouth daily. 90 tablet 1   Sodium Chloride  (HYPERSAL) 3.5 % NEBU Take 1 each by nebulization 2 (two) times daily as needed. 240 mL 5   UNABLE TO FIND Alpha lipoic acid bid     zinc gluconate 50 MG tablet Take 50 mg by mouth daily.     albuterol  (PROVENTIL ) (2.5 MG/3ML) 0.083% nebulizer solution Take 3 mLs (2.5 mg total) by nebulization in the morning and at bedtime. 180 mL 11   No current facility-administered medications on file prior to visit.    Review of Systems  Constitutional:  Negative for appetite change and fever.  HENT:  Positive for congestion, postnasal drip, rhinorrhea, sore throat and trouble swallowing. Negative for ear pain, sinus pressure and sneezing.        Hoarse voice is improved/ some is baseline   Eyes:  Negative for pain and discharge.  Respiratory:  Positive for cough. Negative for apnea, chest tightness, shortness of breath, wheezing and stridor.   Cardiovascular:  Negative for chest pain.  Gastrointestinal:  Negative for diarrhea, nausea and vomiting.  Genitourinary:  Negative for frequency, hematuria and urgency.  Musculoskeletal:  Negative for arthralgias and myalgias.  Skin:  Negative for rash.  Neurological:  Negative for dizziness, weakness, light-headedness and headaches.  Psychiatric/Behavioral:  Negative for confusion and dysphoric mood.        Objective:   Physical Exam Constitutional:      General: She is not in acute distress.    Appearance: Normal appearance. She is well-developed. She is obese. She is not ill-appearing or diaphoretic.  HENT:     Head: Normocephalic and atraumatic.  Eyes:     Conjunctiva/sclera: Conjunctivae normal.     Pupils: Pupils are equal, round, and reactive to light.  Neck:     Thyroid : No thyromegaly.     Vascular: No carotid bruit or JVD.  Cardiovascular:      Rate and Rhythm: Regular rhythm. Tachycardia present.     Heart sounds: Normal heart sounds.     No gallop.  Pulmonary:     Effort: Pulmonary effort is normal. No respiratory distress.     Breath sounds: Normal breath sounds. No stridor. No wheezing, rhonchi or rales.     Comments: Some upper airway sounds that clear with cough  Harsh bs No rales or rhonchi  Chest:     Chest wall: No tenderness.  Abdominal:     General: There is no distension or abdominal bruit.     Palpations: Abdomen is soft.  Musculoskeletal:     Cervical back: Normal range of motion and neck supple.     Right lower leg: No edema.     Left lower leg: No edema.  Lymphadenopathy:     Cervical: No cervical adenopathy.  Skin:    General: Skin is warm and dry.     Coloration: Skin is not pale.     Findings: No rash.     Comments: Healing poison ivy rash right wrist Dry vesicles   Neurological:     Mental Status: She is alert.     Coordination: Coordination normal.     Deep Tendon Reflexes: Reflexes are normal and symmetric. Reflexes normal.  Psychiatric:        Mood and Affect: Mood normal.           Assessment & Plan:   Problem List Items Addressed This Visit       Respiratory   Viral URI with cough - Primary   Last episode with productive cough/fever -no pna seen on cxr thankfully  Reviewed notes and results from UC fisit  Neg covid and flu testing also   Reassuring exam in setting of past bronchiectasis   Finished azithromycin   Encouraged to use cough med over the counter prn  Continue aspiration precautions  Update if not still improving in a week or if worsening  Call back and Er precautions noted in detail today        Bronchiectasis without complication (HCC)   Seen by pulmonary a year ago  Doing better with albuterol  protocol and flutter valve         Musculoskeletal and Integument   Plant dermatitis   Right wrist  Improved with calamine lotion

## 2023-07-22 NOTE — Assessment & Plan Note (Signed)
 Right wrist  Improved with calamine lotion

## 2023-07-22 NOTE — Patient Instructions (Signed)
 Continue treating symptoms  Keep up fluids   Continue breathing treatments   If symptoms worsen or fail to improve more let us  know  Reassuring exam and vital signs today !

## 2023-07-22 NOTE — Assessment & Plan Note (Addendum)
 Last episode with productive cough/fever -no pna seen on cxr thankfully  Reviewed notes and results from UC fisit  Neg covid and flu testing also   Reassuring exam in setting of past bronchiectasis   Finished azithromycin   Encouraged to use cough med over the counter prn  Continue aspiration precautions  Update if not still improving in a week or if worsening  Call back and Er precautions noted in detail today

## 2023-07-22 NOTE — Assessment & Plan Note (Signed)
 Seen by pulmonary a year ago  Doing better with albuterol  protocol and flutter valve

## 2023-07-26 ENCOUNTER — Ambulatory Visit: Admitting: Physician Assistant

## 2023-07-26 ENCOUNTER — Other Ambulatory Visit: Payer: Self-pay | Admitting: Family Medicine

## 2023-08-04 DIAGNOSIS — J690 Pneumonitis due to inhalation of food and vomit: Secondary | ICD-10-CM | POA: Diagnosis not present

## 2023-08-11 ENCOUNTER — Other Ambulatory Visit: Payer: Self-pay | Admitting: Family Medicine

## 2023-08-24 ENCOUNTER — Other Ambulatory Visit: Payer: Self-pay

## 2023-08-24 DIAGNOSIS — F411 Generalized anxiety disorder: Secondary | ICD-10-CM

## 2023-08-24 MED ORDER — BUSPIRONE HCL 15 MG PO TABS: ORAL_TABLET | ORAL | 1 refills | Status: DC

## 2023-08-30 ENCOUNTER — Ambulatory Visit (INDEPENDENT_AMBULATORY_CARE_PROVIDER_SITE_OTHER): Admitting: Physician Assistant

## 2023-08-30 ENCOUNTER — Encounter: Payer: Self-pay | Admitting: Physician Assistant

## 2023-08-30 DIAGNOSIS — F411 Generalized anxiety disorder: Secondary | ICD-10-CM | POA: Diagnosis not present

## 2023-08-30 DIAGNOSIS — G4700 Insomnia, unspecified: Secondary | ICD-10-CM

## 2023-08-30 DIAGNOSIS — G2581 Restless legs syndrome: Secondary | ICD-10-CM | POA: Diagnosis not present

## 2023-08-30 MED ORDER — ALPRAZOLAM 0.5 MG PO TABS: 0.5000 mg | ORAL_TABLET | Freq: Two times a day (BID) | ORAL | 1 refills | Status: DC | PRN

## 2023-08-30 MED ORDER — ROPINIROLE HCL 1 MG PO TABS: ORAL_TABLET | ORAL | Status: DC

## 2023-08-30 NOTE — Progress Notes (Signed)
 [facility]  Patient ID: Suzanne Strong,  MRN: [id number]  PCP: Randeen Laine LABOR, MD  Date of Evaluation: 08/30/2023 Time spent:25 minutes  Chief Complaint:  Chief Complaint   Anxiety; Insomnia; Follow-up    HISTORY/CURRENT STATUS: HPI  For routine med check.   Doing well except having a hard time falling asleep. Once she's asleep, she stays asleep until she has to go to the bathroom.  We increased the amitriptyline  at the last visit but it still been hit or miss as far as quality of sleep.  On a good night she will sleep as much as 5 hours.  She does not nap during the day.  She practices good sleep hygiene. Can't get comfortable w/ RLS and feels that part of difficulty falling to sleep.  She has been on ropinirole  for years and does not think it is helping much now.  She took gabapentin  for neuropathy which was not effective for that problem but might have helped the RLS.  Most recent labs showed normal hemoglobin/hematocrit.  If it was not for the sleep issues she feels okay.  She is able to enjoy things.  Likes to read. Energy and motivation are good for her.  No extreme sadness, tearfulness, or feelings of hopelessness.  ADLs and personal hygiene are normal.   Denies any changes in memory.  Appetite has not changed.  Weight is stable.  No psychosis, delirium, or mania.  Denies suicidal or homicidal thoughts.  Denies dizziness, syncope, seizures, numbness, tingling, tremor, tics, unsteady gait, slurred speech, confusion. No dystonia.   Individual Medical History/ Review of Systems: Changes? :No   Past Psychiatric Medication Trials: Remeron  wasn't effective Xanax  Gabapentin - Unsure if this is helpful for RLS Lyrica  Ropinorole- Helpful for RLS Zoloft  Buspar  Lyrica  Trazodone -Ineffective. Caused unsteadiness.  Amitriptyline -Helps with sleep initiation. Does not notice benefit for pain.  Belsomra -Effective.  Temazepam -caused vivid dreams. Helped with sleep initiation  and did not help with sleep maintenance.   Allergies: Amoxicillin, Penicillin g, Fluconazole, Difuleron [ferrous fumarate-dss], and Other  Current Medications:  Current Outpatient Medications:    acetaminophen  (TYLENOL ) 325 MG tablet, Take 2 tablets (650 mg total) by mouth every 6 (six) hours as needed for mild pain (or Fever >/= 101)., Disp: , Rfl:    albuterol  (PROVENTIL ) (2.5 MG/3ML) 0.083% nebulizer solution, Take 3 mLs (2.5 mg total) by nebulization in the morning and at bedtime., Disp: 180 mL, Rfl: 11   albuterol  (VENTOLIN  HFA) 108 (90 Base) MCG/ACT inhaler, Inhale 2 puffs into the lungs every 6 (six) hours as needed for wheezing or shortness of breath., Disp: 8 g, Rfl: 3   ALPRAZolam  (XANAX ) 0.5 MG tablet, Take 1 tablet (0.5 mg total) by mouth 2 (two) times daily as needed. for anxiety, Disp: 60 tablet, Rfl: 1   amitriptyline  (ELAVIL ) 10 MG tablet, Take 2-3 tablets (20-30 mg total) by mouth at bedtime as needed for sleep., Disp: 180 tablet, Rfl: 1   amLODipine  (NORVASC ) 5 MG tablet, Take 1 tablet by mouth once daily, Disp: 90 tablet, Rfl: 1   atorvastatin  (LIPITOR) 10 MG tablet, Take 1 tablet by mouth once daily, Disp: 90 tablet, Rfl: 1   azithromycin  (ZITHROMAX ) 250 MG tablet, Take 1 tablet (250 mg total) by mouth daily. Take first 2 tablets together, then 1 every day until finished., Disp: 6 tablet, Rfl: 0   b complex vitamins tablet, Take 1 tablet by mouth daily., Disp: , Rfl:    BIOTIN PO, Take by mouth., Disp: , Rfl:  busPIRone  (BUSPAR ) 15 MG tablet, Take 1.5 tabs po BID, Disp: 270 tablet, Rfl: 1   Cholecalciferol  (VITAMIN D -1000 MAX ST) 25 MCG (1000 UT) tablet, Take 2,000 Units by mouth daily. Takes three 50 mcg tablets, 3 capsules daily, Disp: , Rfl:    Emollient (COLLAGEN EX), , Disp: , Rfl:    famotidine  (PEPCID ) 20 MG tablet, Take 1 tablet by mouth twice daily, Disp: 180 tablet, Rfl: 0   Ferrous Gluconate (IRON  27 PO), Take by mouth., Disp: , Rfl:    glipiZIDE  (GLUCOTROL ) 5 MG  tablet, Take 2 tablets (10 mg total) by mouth daily before breakfast. (Patient taking differently: Take 10 mg by mouth 2 (two) times daily before a meal.), Disp: , Rfl:    glucose blood (ONETOUCH ULTRA) test strip, USE TO check blood glucose ONCE DAILY AND AS NEEDED FOR DIABETES, Disp: 100 strip, Rfl: 1   losartan  (COZAAR ) 25 MG tablet, Take 1 tablet (25 mg total) by mouth daily., Disp: 90 tablet, Rfl: 2   magnesium  oxide (MAG-OX) 400 (240 Mg) MG tablet, Take 1 tablet (400 mg total) by mouth 2 (two) times daily. (Patient taking differently: Take 400 mg by mouth daily.), Disp: 60 tablet, Rfl: 1   metFORMIN  (GLUCOPHAGE ) 500 MG tablet, Take 2,000 mg by mouth daily with breakfast., Disp: , Rfl:    Multiple Vitamins-Minerals (WOMENS MULTIVITAMIN PO), Take by mouth., Disp: , Rfl:    omeprazole  (PRILOSEC) 20 MG capsule, Take 1 capsule by mouth once daily at bedtime, Disp: 90 capsule, Rfl: 0   ondansetron  (ZOFRAN ) 4 MG tablet, Take 1 tablet (4 mg total) by mouth daily as needed., Disp: 20 tablet, Rfl: 1   rOPINIRole  (REQUIP ) 1 MG tablet, TAKE 1/2 (ONE-HALF) TABLET BY MOUTH IN THE MORNING AND increase to 4 AT BEDTIME (for RLS), Disp: , Rfl:    sertraline  (ZOLOFT ) 100 MG tablet, Take 1 tablet (100 mg total) by mouth daily., Disp: 90 tablet, Rfl: 1   Sodium Chloride  (HYPERSAL) 3.5 % NEBU, Take 1 each by nebulization 2 (two) times daily as needed., Disp: 240 mL, Rfl: 5   UNABLE TO FIND, Alpha lipoic acid bid, Disp: , Rfl:    zinc gluconate 50 MG tablet, Take 50 mg by mouth daily., Disp: , Rfl:  Medication Side Effects: none  Family Medical/ Social History: Changes? No  MENTAL HEALTH EXAM:  There were no vitals taken for this visit.There is no height or weight on file to calculate BMI.  General Appearance: Casual and Well Groomed  Eye Contact:  Good  Speech:  Clear and Coherent and Normal Rate  Volume:  Normal  Mood:  Euthymic  Affect:  Congruent  Thought Process:  Goal Directed and Descriptions of  Associations: Circumstantial  Orientation:  Full (Time, Place, and Person)  Thought Content: Logical   Suicidal Thoughts:  No  Homicidal Thoughts:  No  Memory:  WNL  Judgement:  Good  Insight:  Good  Psychomotor Activity:  Normal  Concentration:  Concentration: Good  Recall:  Good  Fund of Knowledge: Good  Language: Good  Assets:  Communication Skills Desire for Improvement Financial Resources/Insurance Housing Resilience Transportation  ADL's:  Intact  Cognition: WNL  Prognosis:  Good   DIAGNOSES:    ICD-10-CM   1. Insomnia, unspecified type  G47.00     2. Anxiety state  F41.1 ALPRAZolam  (XANAX ) 0.5 MG tablet    3. Generalized anxiety disorder  F41.1     4. Restless legs syndrome (RLS)  G25.81  Receiving Psychotherapy: No   RECOMMENDATIONS:   PDMP reviewed.  Xanax  filled 07/27/2023. I provided approximately  25 minutes of face to face time during this encounter, including time spent before and after the visit in records review, medical decision making, counseling pertinent to today's visit, and charting.   We discussed the insomnia.  The uncontrolled RLS could be big part of the problem.  I recommend increasing the ropinirole  to 4 p.o. nightly and keep the morning dose the same.  Her PCP prescribes that.  Also increase the Xanax  in the evening.  We can consider adding the gabapentin  back in for the RLS, but we prefer not to add another medicine if possible.  Increase Xanax  0.5 mg, 1 p.o. twice daily as needed anxiety OR 2 po at bedtime. sleep. Continue amitriptyline  10 mg, to 2 to 3 pills nightly as needed.  She may not need it after the increase of ropinirole  and Xanax . Continue BuSpar  15 mg, 1.5 pills twice daily. Increase ropinirole  1 mg, one half p.o. every morning and 4 p.o. nightly by another provider. Continue Zoloft  100 mg, 1 p.o. daily. Continue Continue vitamins and supplements as per med list. Return in 6-8 weeks.  Verneita Cooks, PA-C

## 2023-09-05 ENCOUNTER — Other Ambulatory Visit: Payer: Self-pay | Admitting: Family Medicine

## 2023-10-20 ENCOUNTER — Encounter: Payer: Self-pay | Admitting: Physician Assistant

## 2023-10-20 ENCOUNTER — Ambulatory Visit: Admitting: Physician Assistant

## 2023-10-20 DIAGNOSIS — G4700 Insomnia, unspecified: Secondary | ICD-10-CM | POA: Diagnosis not present

## 2023-10-20 DIAGNOSIS — F32A Depression, unspecified: Secondary | ICD-10-CM | POA: Diagnosis not present

## 2023-10-20 DIAGNOSIS — F411 Generalized anxiety disorder: Secondary | ICD-10-CM | POA: Diagnosis not present

## 2023-10-20 DIAGNOSIS — G2581 Restless legs syndrome: Secondary | ICD-10-CM | POA: Diagnosis not present

## 2023-10-20 MED ORDER — ALPRAZOLAM 0.5 MG PO TABS: 0.5000 mg | ORAL_TABLET | Freq: Two times a day (BID) | ORAL | 5 refills | Status: AC | PRN

## 2023-10-20 NOTE — Progress Notes (Signed)
 [facility]  Patient ID: Suzanne Strong,  MRN: [id number]  PCP: Randeen Laine LABOR, MD  Date of Evaluation: 10/20/2023 Time spent:20 minutes  Chief Complaint:  Chief Complaint   Anxiety; Insomnia; Depression; Follow-up    HISTORY/CURRENT STATUS: HPI  For routine med check.   Still having some nights when she doesn't sleep at all, occurs about twice a month, sometimes 3. Much less often than she was having. Anxiety is still an issue, feels antsy, Xanax  is effective.  Uses it usually once a day, sometimes twice.  I can weather through things most of the time.  No falls or confusion.   She is able to enjoy things.  Likes to read.  Energy and motivation are good.   No extreme sadness, tearfulness, or feelings of hopelessness.  ADLs and personal hygiene are normal.   No changes memory.   Appetite has not changed.  Weight is stable.  RLS sx are no longer an issue.  No mania, delirium, AH/VH.  No SI/HI.  Individual Medical History/ Review of Systems: Changes? :No   Past Psychiatric Medication Trials: Remeron  wasn't effective Xanax  Gabapentin - Unsure if this is helpful for RLS Lyrica  Ropinorole- Helpful for RLS Zoloft  Buspar  Lyrica  Trazodone -Ineffective. Caused unsteadiness.  Amitriptyline -Helps with sleep initiation. Does not notice benefit for pain.  Belsomra -Effective.  Temazepam -caused vivid dreams. Helped with sleep initiation and did not help with sleep maintenance.   Allergies: Amoxicillin, Penicillin g, Fluconazole, Difuleron [ferrous fumarate-dss], and Other  Current Medications:  Current Outpatient Medications:    acetaminophen  (TYLENOL ) 325 MG tablet, Take 2 tablets (650 mg total) by mouth every 6 (six) hours as needed for mild pain (or Fever >/= 101)., Disp: , Rfl:    albuterol  (PROVENTIL ) (2.5 MG/3ML) 0.083% nebulizer solution, Take 3 mLs (2.5 mg total) by nebulization in the morning and at bedtime., Disp: 180 mL, Rfl: 11   albuterol  (VENTOLIN  HFA) 108 (90  Base) MCG/ACT inhaler, Inhale 2 puffs into the lungs every 6 (six) hours as needed for wheezing or shortness of breath., Disp: 8 g, Rfl: 3   amitriptyline  (ELAVIL ) 10 MG tablet, Take 2-3 tablets (20-30 mg total) by mouth at bedtime as needed for sleep., Disp: 180 tablet, Rfl: 1   amLODipine  (NORVASC ) 5 MG tablet, Take 1 tablet by mouth once daily, Disp: 90 tablet, Rfl: 1   b complex vitamins tablet, Take 1 tablet by mouth daily., Disp: , Rfl:    BIOTIN PO, Take by mouth., Disp: , Rfl:    busPIRone  (BUSPAR ) 15 MG tablet, Take 1.5 tabs po BID, Disp: 270 tablet, Rfl: 1   Cholecalciferol  (VITAMIN D -1000 MAX ST) 25 MCG (1000 UT) tablet, Take 2,000 Units by mouth daily. Takes three 50 mcg tablets, 3 capsules daily, Disp: , Rfl:    Emollient (COLLAGEN EX), , Disp: , Rfl:    famotidine  (PEPCID ) 20 MG tablet, Take 1 tablet by mouth twice daily, Disp: 180 tablet, Rfl: 0   Ferrous Gluconate (IRON  27 PO), Take by mouth., Disp: , Rfl:    losartan  (COZAAR ) 25 MG tablet, Take 1 tablet (25 mg total) by mouth daily., Disp: 90 tablet, Rfl: 2   magnesium  oxide (MAG-OX) 400 (240 Mg) MG tablet, Take 1 tablet (400 mg total) by mouth 2 (two) times daily., Disp: 60 tablet, Rfl: 1   metFORMIN  (GLUCOPHAGE ) 500 MG tablet, Take 2,000 mg by mouth daily with breakfast., Disp: , Rfl:    Multiple Vitamins-Minerals (WOMENS MULTIVITAMIN PO), Take by mouth., Disp: , Rfl:  omeprazole  (PRILOSEC) 20 MG capsule, Take 1 capsule by mouth once daily at bedtime, Disp: 90 capsule, Rfl: 0   ondansetron  (ZOFRAN ) 4 MG tablet, Take 1 tablet (4 mg total) by mouth daily as needed., Disp: 20 tablet, Rfl: 1   sertraline  (ZOLOFT ) 100 MG tablet, Take 1 tablet (100 mg total) by mouth daily., Disp: 90 tablet, Rfl: 1   UNABLE TO FIND, Alpha lipoic acid bid, Disp: , Rfl:    zinc gluconate 50 MG tablet, Take 50 mg by mouth daily., Disp: , Rfl:    ALPRAZolam  (XANAX ) 0.5 MG tablet, Take 1 tablet (0.5 mg total) by mouth 2 (two) times daily as needed. for  anxiety, Disp: 60 tablet, Rfl: 5   atorvastatin  (LIPITOR) 10 MG tablet, Take 1 tablet by mouth once daily, Disp: 90 tablet, Rfl: 1   glipiZIDE  (GLUCOTROL ) 5 MG tablet, Take 2 tablets (10 mg total) by mouth daily before breakfast. (Patient not taking: Reported on 10/20/2023), Disp: , Rfl:    glucose blood (ONETOUCH ULTRA) test strip, USE TO check blood glucose ONCE DAILY AND AS NEEDED FOR DIABETES, Disp: 100 strip, Rfl: 1   rOPINIRole  (REQUIP ) 1 MG tablet, TAKE 1/2 (ONE-HALF) TABLET BY MOUTH IN THE MORNING AND 3 AT BEDTIME, Disp: 315 tablet, Rfl: 1   Sodium Chloride  (HYPERSAL) 3.5 % NEBU, Take 1 each by nebulization 2 (two) times daily as needed., Disp: 240 mL, Rfl: 5 Medication Side Effects: none  Family Medical/ Social History: Changes? No  MENTAL HEALTH EXAM:  There were no vitals taken for this visit.There is no height or weight on file to calculate BMI.  General Appearance: Casual and Well Groomed  Eye Contact:  Good  Speech:  Clear and Coherent and Normal Rate  Volume:  Normal  Mood:  Euthymic  Affect:  Congruent  Thought Process:  Goal Directed and Descriptions of Associations: Circumstantial  Orientation:  Full (Time, Place, and Person)  Thought Content: Logical   Suicidal Thoughts:  No  Homicidal Thoughts:  No  Memory:  WNL  Judgement:  Good  Insight:  Good  Psychomotor Activity:  Normal  Concentration:  Concentration: Good and Attention Span: Good  Recall:  Good  Fund of Knowledge: Good  Language: Good  Assets:  Communication Skills Desire for Improvement Financial Resources/Insurance Housing Resilience Transportation  ADL's:  Intact  Cognition: WNL  Prognosis:  Good   DIAGNOSES:    ICD-10-CM   1. Mild depression  F32.A     2. Anxiety state  F41.1 ALPRAZolam  (XANAX ) 0.5 MG tablet    3. Insomnia, unspecified type  G47.00     4. Restless legs syndrome (RLS)  G25.81       Receiving Psychotherapy: No   RECOMMENDATIONS:   PDMP reviewed.  Xanax  filled  09/28/2023. I provided approximately 25  minutes of face to face time during this encounter, including time spent before and after the visit in records review, medical decision making, counseling pertinent to today's visit, and charting.   Suzanne Strong is doing well so no changes are needed. She's been on Xanax  prn for awhile without probs.  She's had no confusion or falls.  She understands and accepts the increased risks of any BZ.   Cont Xanax  0.5 mg, 1 p.o. twice daily as needed anxiety OR 2 po at bedtime. sleep. Continue amitriptyline  10 mg, to 2 to 3 pills nightly as needed.  She may not need it after the increase of ropinirole  and Xanax . Continue BuSpar  15 mg, 1.5 pills twice daily.  Continue ropinirole  1 mg, one half p.o. every morning and 4 p.o. nightly by another provider. Continue Zoloft  100 mg, 1 p.o. daily. Continue Continue vitamins and supplements as per med list. Return in 6 months.  Verneita Cooks, PA-C

## 2023-10-27 ENCOUNTER — Other Ambulatory Visit: Payer: Self-pay | Admitting: Family Medicine

## 2023-11-09 ENCOUNTER — Encounter: Payer: Self-pay | Admitting: Pharmacist

## 2023-11-09 NOTE — Progress Notes (Signed)
 Pharmacy Quality Measure Review  This patient is appearing on a report for being at risk of failing the adherence measure for hypertension (ACEi/ARB) medications this calendar year.   Medication: losartan  25 mg Last fill date: 04/22/23 for 90 day supply  Insurance report was not up to date. No action needed at this time.  Medication has been refilled as of 08/23/23 x90 ds. 0 refills remaining.   Refill due 11/21/23. Reminder set.

## 2023-11-23 ENCOUNTER — Encounter: Payer: Self-pay | Admitting: Pharmacist

## 2023-11-23 NOTE — Progress Notes (Signed)
 Pharmacy Quality Measure Review  This patient is appearing on a report for being at risk of failing the adherence measure for hypertension (ACEi/ARB) medications this calendar year.   Medication: losartan  25 mg Last fill date: 08/23/23 for 90 day supply No refills remaining  Contacted pharmacy to facilitate refills. Pharmacy notes Rx refill request has been sent to PCP.

## 2023-11-28 ENCOUNTER — Other Ambulatory Visit: Payer: Self-pay | Admitting: Physician Assistant

## 2023-11-28 ENCOUNTER — Other Ambulatory Visit: Payer: Self-pay | Admitting: Family Medicine

## 2023-11-30 NOTE — Telephone Encounter (Signed)
 Pt is overdue for her CPE labs prior, please schedule   Med refilled once

## 2023-12-16 DIAGNOSIS — E1159 Type 2 diabetes mellitus with other circulatory complications: Secondary | ICD-10-CM | POA: Diagnosis not present

## 2023-12-16 LAB — MICROALBUMIN / CREATININE URINE RATIO: Microalb Creat Ratio: 8.3

## 2023-12-16 LAB — PROTEIN / CREATININE RATIO, URINE
Albumin, U: 14
Creatinine, Urine: 168

## 2023-12-16 LAB — HEMOGLOBIN A1C: Hemoglobin A1C: 7.5

## 2023-12-23 DIAGNOSIS — E1169 Type 2 diabetes mellitus with other specified complication: Secondary | ICD-10-CM | POA: Diagnosis not present

## 2023-12-23 DIAGNOSIS — E1159 Type 2 diabetes mellitus with other circulatory complications: Secondary | ICD-10-CM | POA: Diagnosis not present

## 2023-12-23 DIAGNOSIS — E785 Hyperlipidemia, unspecified: Secondary | ICD-10-CM | POA: Diagnosis not present

## 2024-01-09 ENCOUNTER — Telehealth: Payer: Self-pay | Admitting: Family Medicine

## 2024-01-09 DIAGNOSIS — I10 Essential (primary) hypertension: Secondary | ICD-10-CM

## 2024-01-09 DIAGNOSIS — E7800 Pure hypercholesterolemia, unspecified: Secondary | ICD-10-CM

## 2024-01-09 DIAGNOSIS — E538 Deficiency of other specified B group vitamins: Secondary | ICD-10-CM

## 2024-01-09 DIAGNOSIS — E559 Vitamin D deficiency, unspecified: Secondary | ICD-10-CM

## 2024-01-09 NOTE — Telephone Encounter (Signed)
-----   Message from Veva JINNY Ferrari sent at 12/28/2023 12:01 PM EST ----- Regarding: Lab orders for Tue, 11.25.25 Patient is scheduled for CPX labs, please order future labs, Thanks , Veva

## 2024-01-11 ENCOUNTER — Other Ambulatory Visit

## 2024-01-14 ENCOUNTER — Other Ambulatory Visit: Payer: Self-pay | Admitting: Physician Assistant

## 2024-01-18 ENCOUNTER — Encounter: Admitting: Family Medicine

## 2024-01-25 ENCOUNTER — Other Ambulatory Visit: Payer: Self-pay | Admitting: Family Medicine

## 2024-01-25 ENCOUNTER — Other Ambulatory Visit

## 2024-01-26 ENCOUNTER — Other Ambulatory Visit

## 2024-01-26 ENCOUNTER — Ambulatory Visit: Payer: Self-pay | Admitting: Family Medicine

## 2024-01-26 DIAGNOSIS — E7800 Pure hypercholesterolemia, unspecified: Secondary | ICD-10-CM

## 2024-01-26 DIAGNOSIS — E559 Vitamin D deficiency, unspecified: Secondary | ICD-10-CM

## 2024-01-26 DIAGNOSIS — I10 Essential (primary) hypertension: Secondary | ICD-10-CM | POA: Diagnosis not present

## 2024-01-26 DIAGNOSIS — E538 Deficiency of other specified B group vitamins: Secondary | ICD-10-CM

## 2024-01-26 LAB — COMPREHENSIVE METABOLIC PANEL WITH GFR
ALT: 8 U/L (ref 0–35)
AST: 12 U/L (ref 0–37)
Albumin: 4.1 g/dL (ref 3.5–5.2)
Alkaline Phosphatase: 99 U/L (ref 39–117)
BUN: 23 mg/dL (ref 6–23)
CO2: 27 meq/L (ref 19–32)
Calcium: 9.4 mg/dL (ref 8.4–10.5)
Chloride: 101 meq/L (ref 96–112)
Creatinine, Ser: 0.95 mg/dL (ref 0.40–1.20)
GFR: 58.35 mL/min — ABNORMAL LOW (ref >60.00)
Glucose, Bld: 132 mg/dL — ABNORMAL HIGH (ref 70–99)
Potassium: 4.5 meq/L (ref 3.5–5.1)
Sodium: 136 meq/L (ref 135–145)
Total Bilirubin: 0.3 mg/dL (ref 0.2–1.2)
Total Protein: 7.3 g/dL (ref 6.0–8.3)

## 2024-01-26 LAB — CBC WITH DIFFERENTIAL/PLATELET
Basophils Absolute: 0 K/uL (ref 0.0–0.1)
Basophils Relative: 0.5 % (ref 0.0–3.0)
Eosinophils Absolute: 0.1 K/uL (ref 0.0–0.7)
Eosinophils Relative: 2.3 % (ref 0.0–5.0)
HCT: 33.1 % — ABNORMAL LOW (ref 36.0–46.0)
Hemoglobin: 11.1 g/dL — ABNORMAL LOW (ref 12.0–15.0)
Lymphocytes Relative: 20.4 % (ref 12.0–46.0)
Lymphs Abs: 1.3 K/uL (ref 0.7–4.0)
MCHC: 33.4 g/dL (ref 30.0–36.0)
MCV: 87.3 fl (ref 78.0–100.0)
Monocytes Absolute: 0.4 K/uL (ref 0.1–1.0)
Monocytes Relative: 5.7 % (ref 3.0–12.0)
Neutro Abs: 4.5 K/uL (ref 1.4–7.7)
Neutrophils Relative %: 71.1 % (ref 43.0–77.0)
Platelets: 259 K/uL (ref 150.0–400.0)
RBC: 3.79 Mil/uL — ABNORMAL LOW (ref 3.87–5.11)
RDW: 14.9 % (ref 11.5–15.5)
WBC: 6.3 K/uL (ref 4.0–10.5)

## 2024-01-26 LAB — MAGNESIUM: Magnesium: 1.1 mg/dL — ABNORMAL LOW (ref 1.5–2.5)

## 2024-01-26 LAB — LIPID PANEL
Cholesterol: 153 mg/dL (ref 0–200)
HDL: 49.8 mg/dL (ref >39.00)
LDL Cholesterol: 71 mg/dL (ref 0–99)
NonHDL: 103.26
Total CHOL/HDL Ratio: 3
Triglycerides: 161 mg/dL — ABNORMAL HIGH (ref 0.0–149.0)
VLDL: 32.2 mg/dL (ref 0.0–40.0)

## 2024-01-26 LAB — VITAMIN D 25 HYDROXY (VIT D DEFICIENCY, FRACTURES): VITD: 32.37 ng/mL (ref 30.00–100.00)

## 2024-01-26 LAB — VITAMIN B12: Vitamin B-12: 164 pg/mL — ABNORMAL LOW (ref 211–911)

## 2024-01-26 LAB — TSH: TSH: 2.15 u[IU]/mL (ref 0.35–5.50)

## 2024-02-01 ENCOUNTER — Encounter: Payer: Self-pay | Admitting: Family Medicine

## 2024-02-01 ENCOUNTER — Ambulatory Visit: Admitting: Family Medicine

## 2024-02-01 VITALS — BP 131/65 | HR 79 | Temp 98.3°F | Ht 63.75 in | Wt 196.5 lb

## 2024-02-01 DIAGNOSIS — K219 Gastro-esophageal reflux disease without esophagitis: Secondary | ICD-10-CM

## 2024-02-01 DIAGNOSIS — D509 Iron deficiency anemia, unspecified: Secondary | ICD-10-CM | POA: Diagnosis not present

## 2024-02-01 DIAGNOSIS — Z0000 Encounter for general adult medical examination without abnormal findings: Secondary | ICD-10-CM

## 2024-02-01 DIAGNOSIS — Z1211 Encounter for screening for malignant neoplasm of colon: Secondary | ICD-10-CM

## 2024-02-01 DIAGNOSIS — F418 Other specified anxiety disorders: Secondary | ICD-10-CM | POA: Diagnosis not present

## 2024-02-01 DIAGNOSIS — E559 Vitamin D deficiency, unspecified: Secondary | ICD-10-CM

## 2024-02-01 DIAGNOSIS — E538 Deficiency of other specified B group vitamins: Secondary | ICD-10-CM

## 2024-02-01 DIAGNOSIS — C801 Malignant (primary) neoplasm, unspecified: Secondary | ICD-10-CM

## 2024-02-01 DIAGNOSIS — M8580 Other specified disorders of bone density and structure, unspecified site: Secondary | ICD-10-CM

## 2024-02-01 DIAGNOSIS — Z7985 Long-term (current) use of injectable non-insulin antidiabetic drugs: Secondary | ICD-10-CM | POA: Diagnosis not present

## 2024-02-01 DIAGNOSIS — I10 Essential (primary) hypertension: Secondary | ICD-10-CM

## 2024-02-01 DIAGNOSIS — E7800 Pure hypercholesterolemia, unspecified: Secondary | ICD-10-CM

## 2024-02-01 DIAGNOSIS — G2581 Restless legs syndrome: Secondary | ICD-10-CM

## 2024-02-01 DIAGNOSIS — G63 Polyneuropathy in diseases classified elsewhere: Secondary | ICD-10-CM | POA: Diagnosis not present

## 2024-02-01 DIAGNOSIS — E119 Type 2 diabetes mellitus without complications: Secondary | ICD-10-CM | POA: Diagnosis not present

## 2024-02-01 DIAGNOSIS — E2839 Other primary ovarian failure: Secondary | ICD-10-CM

## 2024-02-01 DIAGNOSIS — Z1231 Encounter for screening mammogram for malignant neoplasm of breast: Secondary | ICD-10-CM | POA: Insufficient documentation

## 2024-02-01 MED ORDER — CYANOCOBALAMIN 1000 MCG/ML IJ SOLN
1000.0000 ug | Freq: Once | INTRAMUSCULAR | Status: AC
  Administered 2024-02-01: 17:00:00 1000 ug via INTRAMUSCULAR

## 2024-02-01 NOTE — Assessment & Plan Note (Signed)
 Mag is low at 1.1  Pt has taken mag ox daily instead of bid  Will return to bid  Re check in 2 mo with B12

## 2024-02-01 NOTE — Assessment & Plan Note (Signed)
 Ongoing psychiatric care  Per pt almost all anxiety  Had grief after loss of husband also  Taking Zoloft  100 mg daily  Buspar  15 mg (1.5 tabs bid)  Xanax  0.5 mg bid prn

## 2024-02-01 NOTE — Assessment & Plan Note (Signed)
 Disc goals for lipids and reasons to control them Rev last labs with pt Rev low sat fat diet in detail LDL 71 Continues atorvastatin  10 mg daily -one missed dose recently

## 2024-02-01 NOTE — Assessment & Plan Note (Signed)
 Dexa ordered  Pt will call to schedule   Discussed fall prevention, supplements and exercise for bone density  No fractures

## 2024-02-01 NOTE — Assessment & Plan Note (Signed)
 Lab Results  Component Value Date   VITAMINB12 164 (L) 01/26/2024   Shot today  Plans to get back on her vitamin B12 1000 mcg daily  Re check in 2 months

## 2024-02-01 NOTE — Assessment & Plan Note (Signed)
 Colonoscopy 01/2023  No recall due to age

## 2024-02-01 NOTE — Assessment & Plan Note (Signed)
Dexa ordered Pt will call to schedule

## 2024-02-01 NOTE — Assessment & Plan Note (Signed)
 Mammogram ordered Pt will call to schedule

## 2024-02-01 NOTE — Assessment & Plan Note (Signed)
 bp in fair control at this time  BP Readings from Last 1 Encounters:  02/01/24 131/65   No changes needed Most recent labs reviewed  Disc lifstyle change with low sodium diet and exercise   Amlodipine  5 mg daily  Losartan  25 mg daily

## 2024-02-01 NOTE — Assessment & Plan Note (Signed)
 Continues requip  1 mg  1/2 in am 3 at bedtime   Anemia is improved

## 2024-02-01 NOTE — Assessment & Plan Note (Signed)
 Semaglutide 0.5 mg weekly Under endocrinology care

## 2024-02-01 NOTE — Patient Instructions (Addendum)
 Think about some water exercise at the aquatic center  For strength and endurance   B12 shot today  Get vitamin B12 over the counter and take 1000 mcg every day  Let's re check b12 level in about 2 months   Try and get back on track with twice daily magnesium    You have an order for:  [x]   3D Mammogram  [x]   Bone Density     Please call for appointment:   [x]   [facility]  [address] [place] [address]  [phone]  []   [facility]   [address]  [place], [place] [address]  [phone]  []   [facility]      [address] [place], [place]        [phone]         []   [facility]  [address] [place], [place]  [phone]  []  [facility]   [address]   [place], [place] [address]  [phone]  []  [facility]  [address] [place], [place] [address] [phone]    Make sure to wear two piece clothing  No lotions powders or deodorants the day of the appointment Make sure to bring picture ID and insurance card.  Bring list of medications you are currently taking including any supplements.   Schedule your screening mammogram through MyChart!   Select [facility] imaging sites can now be scheduled through MyChart.  Log into your MyChart account.  Go to Visit (or Appointments if  on mobile App) --> Schedule an  Appointment  Under Select a Reason for Visit choose the Mammogram  Screening option.  Complete the pre-visit questions  and select the time and place that  best fits your schedule

## 2024-02-01 NOTE — Assessment & Plan Note (Signed)
 Under endocrinology care  Lab Results  Component Value Date   HGBA1C 7.5 12/16/2023   Normal alb/cr ratio a mo ago  Taking semaglutide 0.5 mg weekly and metformin 

## 2024-02-01 NOTE — Progress Notes (Signed)
 Subjective:    Patient ID: Suzanne Strong, female    DOB: 22-Jan-1946, 78 y.o.   MRN: [id number]  HPI  Here for health maintenance exam and to review chronic medical problems   Wt Readings from Last 3 Encounters:  02/01/24 196 lb 8 oz (89.1 kg)  07/22/23 197 lb (89.4 kg)  03/31/23 187 lb 9.6 oz (85.1 kg)   33.99 kg/m  Vitals:   02/01/24 1553 02/01/24 1630  BP: (!) 152/82 131/65  Pulse: 79   Temp: 98.3 F (36.8 C)   SpO2: 100%     Immunization History  Administered Date(s) Administered   Fluad Quad(high Dose 65+) 11/26/2020   PFIZER Comirnaty(Gray Top)Covid-19 Tri-Sucrose Vaccine 09/25/2020   PFIZER(Purple Top)SARS-COV-2 Vaccination 03/30/2019, 04/20/2019, 11/14/2019   Pfizer Covid-19 Vaccine Bivalent Booster 12yrs & up 09/25/2020   Pfizer(Comirnaty)Fall Seasonal Vaccine 12 years and older 11/17/2023   Pneumococcal Conjugate-13 10/16/2014   Pneumococcal Polysaccharide-23 08/31/2012   Td 06/16/2001, 06/09/2022   Tdap 06/01/2012    Health Maintenance Due  Topic Date Due   Pneumococcal Vaccine: 50+ Years (3 of 3 - PCV20 or PCV21) 10/16/2019   Medicare Annual Wellness (AWV)  12/01/2023   Mammogram  12/24/2023   No pna for a while No falls in a while  Overall doing better    Flu shot - declines   Shingrix - declines   Mammogram 12/2022  [place] Self breast exam-no changes Goes to surgeon for exams  Personal history of breast cancer   Gyn health no problems    Colon cancer screening  Colonoscopy 01/2023 -no recall recommended   Bone health  Dexa 10/2019 osteopenia ([place]) Falls- one trip without fall -did hit her arm(over a cord) , overall doing better  [employer] -vitamin D  Last vitamin D  Lab Results  Component Value Date   VD25OH 32.37 01/26/2024    Exercise  Lets hurt a lot -tries to exercise  Knee pain    Mood    02/01/2024    4:01 PM 07/22/2023   12:11 PM 01/21/2023   10:16 AM 01/06/2023   10:51 AM 12/21/2022    10:29 AM  Depression screen PHQ 2/9  Decreased Interest 0 0 0 0 0  Down, Depressed, Hopeless 0 0 0 0 0  PHQ - 2 Score 0 0 0 0 0  Altered sleeping 1 0 1 3 3   Tired, decreased energy 0 2 1 1 3   Change in appetite 0 2 0 0 2  Feeling bad or failure about yourself  0 0 0 0 0  Trouble concentrating 0 0 0 0 0  Moving slowly or fidgety/restless 1 1 0 0 0  Suicidal thoughts 0 0 0 0 0  PHQ-9 Score 2 5  2  4  8    Difficult doing work/chores Somewhat difficult Not difficult at all Not difficult at all Somewhat difficult Somewhat difficult     Data saved with a previous flowsheet row definition   Under psychiatric care  No changes  Elavil  Buspar  Zoloft    Gets very little sleep     HTN bp is stable today  No cp or palpitations or headaches or edema  No side effects to medicines  BP Readings from Last 3 Encounters:  02/01/24 131/65  07/22/23 (!) 118/58  07/16/23 101/66     Lab Results  Component Value Date   NA 136 01/26/2024   K 4.5 01/26/2024   CO2 27 01/26/2024   GLUCOSE 132 (H) 01/26/2024   BUN 23  01/26/2024   CREATININE 0.95 01/26/2024   CALCIUM  9.4 01/26/2024   GFR 58.35 (L) 01/26/2024   GFRNONAA >60 12/29/2022   Does make effort to drink good water   Amlodipine  5 mg daily  Losartan  25 mg daily   Declines cpap for osa in past   GERD Pepcid  20 mg bid  Omeprazole   20 mg daily   Lab Results  Component Value Date   VITAMINB12 164 (L) 01/26/2024  Is off her B12   Balanced diet  Does eat some chicken Veggies  Figured out what she can and cannot swallow    DM2 Endocrinology care  . Lab Results  Component Value Date   HGBA1C 7.5 12/16/2023   HGBA1C 6.0 06/21/2023   HGBA1C 6 12/15/2022   Urine alb/cr ratio 8.3 a month ago   Semaglutide 0.5 mg weekly  Metformin    Iron  def  Lab Results  Component Value Date   WBC 6.3 01/26/2024   HGB 11.1 (L) 01/26/2024   HCT 33.1 (L) 01/26/2024   MCV 87.3 01/26/2024   PLT 259.0 01/26/2024   Has had iron   infusions Oral iron    Lab Results  Component Value Date   IRON  116 03/31/2023   TIBC 370 03/31/2023   FERRITIN 179 (H) 03/31/2023     Hyperlipidemia  Lab Results  Component Value Date   CHOL 153 01/26/2024   CHOL 123 11/10/2022   CHOL 121 02/16/2020   Lab Results  Component Value Date   HDL 49.80 01/26/2024   HDL 52.20 11/10/2022   HDL 48 02/16/2020   Lab Results  Component Value Date   LDLCALC 71 01/26/2024   LDLCALC 32 11/10/2022   LDLCALC 57 02/16/2020   Lab Results  Component Value Date   TRIG 161.0 (H) 01/26/2024   TRIG 195.0 (H) 11/10/2022   TRIG 78 02/16/2020   Lab Results  Component Value Date   CHOLHDL 3 01/26/2024   CHOLHDL 2 11/10/2022   CHOLHDL 2.5 02/16/2020   Lab Results  Component Value Date   LDLDIRECT 97.0 06/29/2017   LDLDIRECT 116.1 05/25/2012   LDLDIRECT 114.0 06/30/2010   Atorvastatin  10 mg daily  Missed one day recently    Hypo mag Mag level 1.1   low   Mag ox 400 mg -once daily /supposed to take twice   Patient Active Problem List   Diagnosis Date Noted   Long-term (current) use of injectable non-insulin  antidiabetic drugs 02/01/2024   Encounter for screening mammogram for breast cancer 02/01/2024   Dermatitis associated with moisture 01/21/2023   OA (osteoarthritis) of knee 01/06/2023   Type 2 diabetes mellitus with diabetic polyneuropathy, unspecified whether long term insulin  use (HCC) 12/21/2022   Bronchiectasis without complication (HCC) 07/31/2022   Poor balance 04/28/2022   Vision changes 02/12/2022   Polypharmacy 02/12/2022   At high risk for aspiration 04/23/2021   Recurrent aspiration pneumonia (HCC) 12/09/2020   Dysphagia    Gastric erythema    Gastric polyp    Polyp of colon    History of aspiration pneumonia 02/15/2020   GERD (gastroesophageal reflux disease)    Hypertension    Urinary urgency 01/05/2020   Grief reaction 09/18/2019   Swallowing dysfunction 07/18/2019   Medicare annual wellness visit,  subsequent 06/29/2017   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   Preoperative examination 04/27/2017   Carpal tunnel syndrome of right wrist 11/13/2016   Obstructive sleep apnea 01/01/2016   Carcinoma of upper-outer quadrant of left breast in female, estrogen receptor negative (HCC)  11/21/2015   Hypomagnesemia 08/28/2015   Estrogen deficiency 10/16/2014   Colon cancer screening 10/16/2014   Controlled type 2 diabetes mellitus without complication (HCC) 03/28/2014   RLS (restless legs syndrome) 02/21/2014   Chronic neck pain 07/25/2010   Depression with anxiety 07/07/2010   Routine general medical examination at a health care facility 06/26/2010   B12 deficiency 09/04/2009   Anemia, iron  deficiency 08/29/2009   Gastroparesis 08/26/2009   Vitamin D  deficiency 07/19/2008   Osteopenia 07/19/2007   Hyperlipidemia 07/18/2007   Past Medical History:  Diagnosis Date   Allergy 1999   Anemia    iron  deficiency and B12 deficiency   Anxiety    Arthritis 2022   some in my knees   Breast cancer (HCC) 02/25/2015   upper-outer quadrant of left female breast, Triple Negative. Neo-adjuvant chemo with complete pathologic response.    Cancer (HCC) 02/16/2005   Cataract Floating cataracts 2018   Cervical dysplasia    conization   Chronic kidney disease 2024   questionable   Diabetes mellitus without complication (HCC)    Diverticulosis    Dyspnea    Fever 04/11/2015   Gastroparesis    related to previous radiation tx   GERD (gastroesophageal reflux disease)    Hyperlipidemia    Hypertension    Neuromuscular disorder (HCC)    Neuropathy    legs/feet, S/P chemo meds   Personal history of chemotherapy    Personal history of radiation therapy 2017   LEFT lumpectomy w/ radiation   Pneumonia    RLS (restless legs syndrome)    Seizures (HCC) 2022   Shingles    Hx of   Sleep apnea    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HCC) 2006   Wears dentures    partial bottom   Past Surgical History:   Procedure Laterality Date   APPENDECTOMY  1968   BIOPSY  02/02/2023   Procedure: BIOPSY;  Surgeon: Therisa Bi, MD;  Location: [facility] ENDOSCOPY;  Service: Gastroenterology;;   BREAST BIOPSY Left 02/25/2015   INVASIVE MAMMARY CARCINOMA,Triple negative.   BREAST CYST ASPIRATION     BREAST EXCISIONAL BIOPSY Left 12/2015   surgery   BREAST LUMPECTOMY WITH NEEDLE LOCALIZATION Left 10/02/2015   Procedure: BREAST LUMPECTOMY WITH NEEDLE LOCALIZATION;  Surgeon: Reyes LELON Cota, MD;  Location: [facility] ORS;  Service: General;  Laterality: Left;   BREAST LUMPECTOMY WITH SENTINEL LYMPH NODE BIOPSY Left 10/02/2015   Procedure: LEFT BREAST WIDE EXCISION WITH SENTINEL LYMPH NODE BX;  Surgeon: Reyes LELON Cota, MD;  Location: [facility] ORS;  Service: General;  Laterality: Left;   BREAST SURGERY  02/25/2015   breast biopsy benign   CARPAL TUNNEL RELEASE Right 05/19/2017   Procedure: CARPAL TUNNEL RELEASE;  Surgeon: Cleotilde Barrio, MD;  Location: [facility] ORS;  Service: Orthopedics;  Laterality: Right;   CATARACT EXTRACTION W/PHACO Right 10/26/2018   Procedure: CATARACT EXTRACTION PHACO AND INTRAOCULAR LENS PLACEMENT (IOC) right diabetic;  Surgeon: Mittie Gaskin, MD;  Location: [facility]ERY [facility];  Service: Ophthalmology;  Laterality: Right;  Diabetic - oral meds cancer center been notified and will come   CATARACT EXTRACTION W/PHACO Left 11/16/2018   Procedure: CATARACT EXTRACTION PHACO AND INTRAOCULAR LENS PLACEMENT (IOC) LEFT DIABETIC  01:01.0  17.0%  10.37;  Surgeon: Mittie Gaskin, MD;  Location: [facility];  Service: Ophthalmology;  Laterality: Left;  Diabetic - oral meds Port-a-cath   CHOLECYSTECTOMY  1990 ish   Cologuard  07/22/2016   Negative   COLONOSCOPY WITH PROPOFOL  N/A 05/24/2020   Procedure: COLONOSCOPY WITH  PROPOFOL ;  Surgeon: Janalyn Keene NOVAK, MD;  Location: [facility] ENDOSCOPY;  Service: Endoscopy;  Laterality: N/A;   COLONOSCOPY WITH PROPOFOL   02/02/2023   Procedure:  COLONOSCOPY WITH PROPOFOL ;  Surgeon: Therisa Bi, MD;  Location: [facility] ENDOSCOPY;  Service: Gastroenterology;;   ESOPHAGOGASTRODUODENOSCOPY  07/2009   normal with few gastric polyps   ESOPHAGOGASTRODUODENOSCOPY (EGD) WITH PROPOFOL  N/A 05/24/2020   Procedure: ESOPHAGOGASTRODUODENOSCOPY (EGD) WITH PROPOFOL ;  Surgeon: Janalyn Keene NOVAK, MD;  Location: [facility] ENDOSCOPY;  Service: Endoscopy;  Laterality: N/A;   ESOPHAGOGASTRODUODENOSCOPY (EGD) WITH PROPOFOL  N/A 02/02/2023   Procedure: ESOPHAGOGASTRODUODENOSCOPY (EGD) WITH PROPOFOL ;  Surgeon: Therisa Bi, MD;  Location: [facility] ENDOSCOPY;  Service: Gastroenterology;  Laterality: N/A;   EYE SURGERY  10/26/2018   FLEXIBLE BRONCHOSCOPY Bilateral 06/24/2022   Procedure: FLEXIBLE BRONCHOSCOPY;  Surgeon: Isadora Hose, MD;  Location: [facility] ORS;  Service: Pulmonary;  Laterality: Bilateral;   FRACTURE SURGERY  1950 ish   broke my left arm as a child   GIVENS CAPSULE STUDY N/A 05/04/2023   Procedure: IMAGING PROCEDURE, GI TRACT, INTRALUMINAL, VIA CAPSULE;  Surgeon: Therisa Bi, MD;  Location: [facility] ENDOSCOPY;  Service: Gastroenterology;  Laterality: N/A;   MOLE REMOVAL     PORT-A-CATH REMOVAL     PORTACATH PLACEMENT     PORTACATH PLACEMENT Right 03/12/2015   Procedure: INSERTION PORT-A-CATH;  Surgeon: Reyes LELON Cota, MD;  Location: [facility] ORS;  Service: General;  Laterality: Right;   RADICAL NECK DISSECTION     TONSILLECTOMY Left    cancer treated with chemo and radiation   TRIGGER FINGER RELEASE Right 05/19/2017   Procedure: RELEASE TRIGGER FINGER/A-1 PULLEY ring finger;  Surgeon: Cleotilde Barrio, MD;  Location: [facility] ORS;  Service: Orthopedics;  Laterality: Right;   VIDEO BRONCHOSCOPY WITH ENDOBRONCHIAL ULTRASOUND Bilateral 06/24/2022   Procedure: VIDEO BRONCHOSCOPY WITH ENDOBRONCHIAL ULTRASOUND;  Surgeon: Isadora Hose, MD;  Location: [facility] ORS;  Service: Pulmonary;  Laterality: Bilateral;   Social History[1] Family History  Problem Relation Age of Onset    Osteoporosis Mother    Hyperlipidemia Mother    Depression Mother    Cancer Mother        skin cancer ? basal cell and lung ca heavy smoker   Anxiety disorder Mother    COPD Mother    Drug abuse Mother    Miscarriages / Stillbirths Mother    Varicose Veins Mother    Alcohol abuse Father    Cancer Father        skin CA ? basal cell   Heart disease Father        CHF   Hearing loss Father    Hyperlipidemia Brother    Hypertension Brother    Breast cancer Neg Hx    Allergies[2] Medications Ordered Prior to Encounter[3]  Review of Systems  Constitutional:  Positive for fatigue. Negative for activity change, appetite change, fever and unexpected weight change.  HENT:  Negative for congestion, ear pain, rhinorrhea, sinus pressure and sore throat.   Eyes:  Negative for pain, redness and visual disturbance.  Respiratory:  Negative for cough, shortness of breath and wheezing.   Cardiovascular:  Negative for chest pain and palpitations.  Gastrointestinal:  Negative for abdominal pain, blood in stool, constipation and diarrhea.  Endocrine: Negative for polydipsia and polyuria.  Genitourinary:  Negative for dysuria, frequency and urgency.  Musculoskeletal:  Negative for arthralgias, back pain and myalgias.  Skin:  Negative for pallor and rash.  Allergic/Immunologic: Negative for environmental allergies.  Neurological:  Positive for numbness. Negative for dizziness, syncope and  headaches.  Hematological:  Negative for adenopathy. Does not bruise/bleed easily.  Psychiatric/Behavioral:  Positive for decreased concentration and sleep disturbance. Negative for dysphoric mood. The patient is nervous/anxious.        Objective:   Physical Exam Constitutional:      General: She is not in acute distress.    Appearance: Normal appearance. She is well-developed. She is obese. She is not ill-appearing or diaphoretic.  HENT:     Head: Normocephalic and atraumatic.     Right Ear: Tympanic  membrane, ear canal and external ear normal.     Left Ear: Tympanic membrane, ear canal and external ear normal.     Nose: Nose normal. No congestion.     Mouth/Throat:     Mouth: Mucous membranes are moist.     Pharynx: Oropharynx is clear. No posterior oropharyngeal erythema.  Eyes:     General: No scleral icterus.    Extraocular Movements: Extraocular movements intact.     Conjunctiva/sclera: Conjunctivae normal.     Pupils: Pupils are equal, round, and reactive to light.  Neck:     Thyroid : No thyromegaly.     Vascular: No carotid bruit or JVD.  Cardiovascular:     Rate and Rhythm: Normal rate and regular rhythm.     Pulses: Normal pulses.     Heart sounds: Normal heart sounds.     No gallop.  Pulmonary:     Effort: Pulmonary effort is normal. No respiratory distress.     Breath sounds: Normal breath sounds. No wheezing.     Comments: Good air exch Chest:     Chest wall: No tenderness.  Abdominal:     General: Bowel sounds are normal. There is no distension or abdominal bruit.     Palpations: Abdomen is soft. There is no mass.     Tenderness: There is no abdominal tenderness.     Hernia: No hernia is present.  Genitourinary:    Comments: Breast exam: No mass, nodules, thickening, tenderness, bulging, retraction, inflamation, nipple discharge or skin changes noted.  No axillary or clavicular LA.     Musculoskeletal:        General: No tenderness. Normal range of motion.     Cervical back: Normal range of motion and neck supple. No rigidity. No muscular tenderness.     Right lower leg: No edema.     Left lower leg: No edema.     Comments: No kyphosis   Lymphadenopathy:     Cervical: No cervical adenopathy.  Skin:    General: Skin is warm and dry.     Coloration: Skin is not pale.     Findings: No erythema or rash.     Comments: Solar lentigines diffusely Scattered SKs   Neurological:     Mental Status: She is alert. Mental status is at baseline.     Cranial Nerves:  No cranial nerve deficit.     Motor: No abnormal muscle tone.     Coordination: Coordination normal.     Gait: Gait normal.     Deep Tendon Reflexes: Reflexes are normal and symmetric. Reflexes normal.  Psychiatric:        Mood and Affect: Mood normal.        Cognition and Memory: Cognition and memory normal.           Assessment & Plan:   Problem List Items Addressed This Visit       Cardiovascular and Mediastinum   Hypertension   bp in  fair control at this time  BP Readings from Last 1 Encounters:  02/01/24 131/65   No changes needed Most recent labs reviewed  Disc lifstyle change with low sodium diet and exercise   Amlodipine  5 mg daily  Losartan  25 mg daily         Digestive   GERD (gastroesophageal reflux disease)   Continues  Pepcid  20 mg bid  Omeprazole  20 mg daily   Symptoms are well controlled         Endocrine   Controlled type 2 diabetes mellitus without complication (HCC)   Under endocrinology care  Lab Results  Component Value Date   HGBA1C 7.5 12/16/2023   Normal alb/cr ratio a mo ago  Taking semaglutide 0.5 mg weekly and metformin         Relevant Medications   Semaglutide, 1 MG/DOSE, (OZEMPIC, 1 MG/DOSE,) 2 MG/1.5ML SOPN     Nervous and Auditory   Neuropathy associated with cancer (HCC)   And diabetes  Continues to be affected  No improvement with treatment so far         Musculoskeletal and Integument   Osteopenia   Dexa ordered  Pt will call to schedule   Discussed fall prevention, supplements and exercise for bone density  No fractures          Other   Vitamin D  deficiency   Last vitamin D  Lab Results  Component Value Date   VD25OH 32.37 01/26/2024   Vitamin D  level is therapeutic with current supplementation (low normal) Disc importance of this to bone and overall health       Routine general medical examination at a health care facility - Primary   Reviewed health habits including diet and exercise and  skin cancer prevention Reviewed appropriate screening tests for age  Also reviewed health mt list, fam hx and immunization status , as well as social and family history   See HPI Labs reviewed and ordered Health Maintenance  Topic Date Due   Pneumococcal Vaccine for age over 72 (3 of 3 - PCV20 or PCV21) 10/16/2019   Medicare Annual Wellness Visit  12/01/2023   Breast Cancer Screening  12/24/2023   Flu Shot  05/16/2024*   Zoster (Shingles) Vaccine (1 of 2) 05/01/2025*   COVID-19 Vaccine (6 - Pfizer risk 2025-26 season) 05/17/2024   Eye exam for diabetics  05/19/2024   Hemoglobin A1C  06/15/2024   Yearly kidney health urinalysis for diabetes  12/15/2024   Yearly kidney function blood test for diabetes  01/25/2025   DTaP/Tdap/Td vaccine (4 - Td or Tdap) 06/08/2032   Osteoporosis screening with Bone Density Scan  Completed   Hepatitis C Screening  Completed   Meningitis B Vaccine  Aged Out   Complete foot exam   Discontinued   Colon Cancer Screening  Discontinued   Cologuard (Stool DNA test)  Discontinued  *Topic was postponed. The date shown is not the original due date.    Declines flu shot and shingrix Mammogram and dexa ordered  Discussed fall prevention, supplements and exercise for bone density  Discussed options for exercise  Continues psychiatric care  PHQ 5       RLS (restless legs syndrome)   Continues requip  1 mg  1/2 in am 3 at bedtime   Anemia is improved       Long-term (current) use of injectable non-insulin  antidiabetic drugs   Semaglutide 0.5 mg weekly Under endocrinology care       Hypomagnesemia   Mag is  low at 1.1  Pt has taken mag ox daily instead of bid  Will return to bid  Re check in 2 mo with B12       Hyperlipidemia   Disc goals for lipids and reasons to control them Rev last labs with pt Rev low sat fat diet in detail LDL 71 Continues atorvastatin  10 mg daily -one missed dose recently      Estrogen deficiency   Dexa ordered  Pt  will call to schedule       Relevant Orders   DG Bone Density   Encounter for screening mammogram for breast cancer   Mammogram ordered  Pt will call to schedule       Relevant Orders   MM 3D SCREENING MAMMOGRAM BILATERAL BREAST   Depression with anxiety   Ongoing psychiatric care  Per pt almost all anxiety  Had grief after loss of husband also  Taking Zoloft  100 mg daily  Buspar  15 mg (1.5 tabs bid)  Xanax  0.5 mg bid prn           Colon cancer screening   Colonoscopy 01/2023  No recall due to age       B12 deficiency   Lab Results  Component Value Date   VITAMINB12 164 (L) 01/26/2024   Shot today  Plans to get back on her vitamin B12 1000 mcg daily  Re check in 2 months       Anemia, iron  deficiency   Lab Results  Component Value Date   WBC 6.3 01/26/2024   HGB 11.1 (L) 01/26/2024   HCT 33.1 (L) 01/26/2024   MCV 87.3 01/26/2024   PLT 259.0 01/26/2024   No recent iron  infusion          [1]  Social History Tobacco Use   Smoking status: Former    Current packs/day: 0.00    Average packs/day: 0.5 packs/day for 40.0 years (20.0 ttl pk-yrs)    Types: Cigarettes    Quit date: 24    Years since quitting: 40.9   Smokeless tobacco: Never   Tobacco comments:    Not much of a smoker, peer pressure mainly  Vaping Use   Vaping status: Never Used  Substance Use Topics   Alcohol use: No   Drug use: No  [2]  Allergies Allergen Reactions   Amoxicillin Swelling    REACTION: rash, swelling Has patient had a PCN reaction causing immediate rash, facial/tongue/throat swelling, SOB or lightheadedness with hypotension: yes Has patient had a PCN reaction causing severe rash involving mucus membranes or skin necrosis: no Has patient had a PCN reaction that required hospitalization: no Has patient had a PCN reaction occurring within the last 10 years: yes If all of the above answers are NO, then may proceed with Cephalosporin use.  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     albuterol  (PROVENTIL ) (2.5 MG/3ML) 0.083% nebulizer solution Take 3 mLs (2.5 mg total) by nebulization  in the morning and at bedtime. 180 mL 11   albuterol  (VENTOLIN  HFA) 108 (90 Base) MCG/ACT inhaler Inhale 2 puffs into the lungs every 6 (six) hours as needed for wheezing or shortness of breath. 8 g 3   ALPRAZolam  (XANAX ) 0.5 MG tablet Take 1 tablet (0.5 mg total) by mouth 2 (two) times daily as needed. for anxiety 60 tablet 5   amitriptyline  (ELAVIL ) 10 MG tablet TAKE 2 TO 3 TABLETS BY MOUTH AT BEDTIME AS NEEDED FOR SLEEP 180 tablet 1   amLODipine  (NORVASC ) 5 MG tablet Take 1 tablet by mouth once daily 90 tablet 0   atorvastatin  (LIPITOR) 10 MG tablet Take 1 tablet by mouth once daily 90 tablet 1   b complex vitamins tablet Take 1 tablet by mouth daily.     BIOTIN PO Take by mouth.     busPIRone  (BUSPAR ) 15 MG tablet Take 1.5 tabs po BID 270 tablet 1   Cholecalciferol  (VITAMIN D -1000 MAX ST) 25 MCG (1000 UT) tablet Take 2,000 Units by mouth daily. Takes three 50 mcg tablets, 3 capsules daily     Emollient (COLLAGEN EX)      famotidine  (PEPCID ) 20 MG tablet Take 1 tablet by mouth twice daily 180 tablet 0   Ferrous Gluconate (IRON  27 PO) Take by mouth.     glucose blood (ONETOUCH ULTRA) test strip USE TO check blood glucose ONCE DAILY AND AS NEEDED FOR DIABETES 100 strip 1   losartan  (COZAAR ) 25 MG tablet Take 1 tablet by mouth once daily 90 tablet 0   magnesium  oxide (MAG-OX) 400 (240  Mg) MG tablet Take 1 tablet (400 mg total) by mouth 2 (two) times daily. 60 tablet 1   metFORMIN  (GLUCOPHAGE ) 500 MG tablet Take 2,000 mg by mouth daily with breakfast.     Multiple Vitamins-Minerals (WOMENS MULTIVITAMIN PO) Take by mouth.     omeprazole  (PRILOSEC) 20 MG capsule Take 1 capsule by mouth once daily at bedtime 90 capsule 0   ondansetron  (ZOFRAN ) 4 MG tablet Take 1 tablet (4 mg total) by mouth daily as needed. 20 tablet 1   rOPINIRole  (REQUIP ) 1 MG tablet TAKE 1/2 (ONE-HALF) TABLET BY MOUTH IN THE MORNING AND 3 AT BEDTIME 315 tablet 1   Semaglutide, 1 MG/DOSE, (OZEMPIC, 1 MG/DOSE,) 2 MG/1.5ML SOPN Inject 0.5 mg into the skin once a week.     sertraline  (ZOLOFT ) 100 MG tablet Take 1 tablet by mouth once daily 90 tablet 0   Sodium Chloride  (HYPERSAL) 3.5 % NEBU Take 1 each by nebulization 2 (two) times daily as needed. 240 mL 5   UNABLE TO FIND Alpha lipoic acid bid     zinc gluconate 50 MG tablet Take 50 mg by mouth daily.     No current facility-administered medications on file prior to visit.

## 2024-02-01 NOTE — Assessment & Plan Note (Signed)
 And diabetes  Continues to be affected  No improvement with treatment so far

## 2024-02-01 NOTE — Assessment & Plan Note (Signed)
 Lab Results  Component Value Date   WBC 6.3 01/26/2024   HGB 11.1 (L) 01/26/2024   HCT 33.1 (L) 01/26/2024   MCV 87.3 01/26/2024   PLT 259.0 01/26/2024   No recent iron  infusion

## 2024-02-01 NOTE — Assessment & Plan Note (Signed)
 Reviewed health habits including diet and exercise and skin cancer prevention Reviewed appropriate screening tests for age  Also reviewed health mt list, fam hx and immunization status , as well as social and family history   See HPI Labs reviewed and ordered Health Maintenance  Topic Date Due   Pneumococcal Vaccine for age over 28 (3 of 3 - PCV20 or PCV21) 10/16/2019   Medicare Annual Wellness Visit  12/01/2023   Breast Cancer Screening  12/24/2023   Flu Shot  05/16/2024*   Zoster (Shingles) Vaccine (1 of 2) 05/01/2025*   COVID-19 Vaccine (6 - Pfizer risk 2025-26 season) 05/17/2024   Eye exam for diabetics  05/19/2024   Hemoglobin A1C  06/15/2024   Yearly kidney health urinalysis for diabetes  12/15/2024   Yearly kidney function blood test for diabetes  01/25/2025   DTaP/Tdap/Td vaccine (4 - Td or Tdap) 06/08/2032   Osteoporosis screening with Bone Density Scan  Completed   Hepatitis C Screening  Completed   Meningitis B Vaccine  Aged Out   Complete foot exam   Discontinued   Colon Cancer Screening  Discontinued   Cologuard (Stool DNA test)  Discontinued  *Topic was postponed. The date shown is not the original due date.    Declines flu shot and shingrix Mammogram and dexa ordered  Discussed fall prevention, supplements and exercise for bone density  Discussed options for exercise  Continues psychiatric care  PHQ 5

## 2024-02-01 NOTE — Assessment & Plan Note (Signed)
 Last vitamin D  Lab Results  Component Value Date   VD25OH 32.37 01/26/2024   Vitamin D  level is therapeutic with current supplementation (low normal) Disc importance of this to bone and overall health

## 2024-02-01 NOTE — Assessment & Plan Note (Signed)
 Continues  Pepcid  20 mg bid  Omeprazole  20 mg daily   Symptoms are well controlled

## 2024-02-23 ENCOUNTER — Other Ambulatory Visit: Payer: Self-pay | Admitting: Physician Assistant

## 2024-02-23 DIAGNOSIS — F411 Generalized anxiety disorder: Secondary | ICD-10-CM

## 2024-03-04 ENCOUNTER — Encounter: Payer: Self-pay | Admitting: Internal Medicine

## 2024-03-21 ENCOUNTER — Encounter: Payer: Self-pay | Admitting: Internal Medicine

## 2024-03-24 ENCOUNTER — Other Ambulatory Visit: Payer: Self-pay | Admitting: Family Medicine

## 2024-03-24 ENCOUNTER — Other Ambulatory Visit: Payer: Self-pay | Admitting: Physician Assistant

## 2024-04-03 ENCOUNTER — Other Ambulatory Visit

## 2024-04-18 ENCOUNTER — Ambulatory Visit: Admitting: Physician Assistant
